# Patient Record
Sex: Female | Born: 1935
Health system: Southern US, Community
[De-identification: ages and names within clinical notes are randomized; demographics above are authoritative.]

## PROBLEM LIST (undated history)

## (undated) DIAGNOSIS — I10 Essential (primary) hypertension: Secondary | ICD-10-CM

## (undated) DIAGNOSIS — I129 Hypertensive chronic kidney disease with stage 1 through stage 4 chronic kidney disease, or unspecified chronic kidney disease: Secondary | ICD-10-CM

## (undated) DIAGNOSIS — Z6820 Body mass index (BMI) 20.0-20.9, adult: Secondary | ICD-10-CM

## (undated) DIAGNOSIS — I2510 Atherosclerotic heart disease of native coronary artery without angina pectoris: Secondary | ICD-10-CM

## (undated) DIAGNOSIS — N390 Urinary tract infection, site not specified: Secondary | ICD-10-CM

## (undated) DIAGNOSIS — Z950 Presence of cardiac pacemaker: Secondary | ICD-10-CM

## (undated) DIAGNOSIS — G4731 Primary central sleep apnea: Secondary | ICD-10-CM

## (undated) DIAGNOSIS — I739 Peripheral vascular disease, unspecified: Secondary | ICD-10-CM

## (undated) DIAGNOSIS — U071 COVID-19: Secondary | ICD-10-CM

## (undated) DIAGNOSIS — D649 Anemia, unspecified: Secondary | ICD-10-CM

## (undated) DIAGNOSIS — I099 Rheumatic heart disease, unspecified: Secondary | ICD-10-CM

## (undated) DIAGNOSIS — K2970 Gastritis, unspecified, without bleeding: Secondary | ICD-10-CM

## (undated) DIAGNOSIS — I340 Nonrheumatic mitral (valve) insufficiency: Secondary | ICD-10-CM

## (undated) DIAGNOSIS — I38 Endocarditis, valve unspecified: Secondary | ICD-10-CM

## (undated) DIAGNOSIS — E7800 Pure hypercholesterolemia, unspecified: Secondary | ICD-10-CM

## (undated) DIAGNOSIS — K219 Gastro-esophageal reflux disease without esophagitis: Secondary | ICD-10-CM

## (undated) DIAGNOSIS — H269 Unspecified cataract: Secondary | ICD-10-CM

## (undated) DIAGNOSIS — K838 Other specified diseases of biliary tract: Secondary | ICD-10-CM

## (undated) DIAGNOSIS — I4821 Permanent atrial fibrillation: Secondary | ICD-10-CM

## (undated) DIAGNOSIS — K5792 Diverticulitis of intestine, part unspecified, without perforation or abscess without bleeding: Secondary | ICD-10-CM

## (undated) DIAGNOSIS — N182 Chronic kidney disease, stage 2 (mild): Secondary | ICD-10-CM

## (undated) DIAGNOSIS — I509 Heart failure, unspecified: Secondary | ICD-10-CM

## (undated) DIAGNOSIS — J4489 Other specified chronic obstructive pulmonary disease: Secondary | ICD-10-CM

## (undated) DIAGNOSIS — E559 Vitamin D deficiency, unspecified: Secondary | ICD-10-CM

## (undated) DIAGNOSIS — J449 Chronic obstructive pulmonary disease, unspecified: Secondary | ICD-10-CM

## (undated) DIAGNOSIS — I779 Disorder of arteries and arterioles, unspecified: Secondary | ICD-10-CM

## (undated) DIAGNOSIS — I495 Sick sinus syndrome: Secondary | ICD-10-CM

## (undated) DIAGNOSIS — R9389 Abnormal findings on diagnostic imaging of other specified body structures: Secondary | ICD-10-CM

## (undated) HISTORY — DX: Permanent atrial fibrillation: I48.21

## (undated) HISTORY — PX: HERNIA REPAIR: SHX51

## (undated) HISTORY — DX: Hypertensive chronic kidney disease with stage 1 through stage 4 chronic kidney disease, or unspecified chronic kidney disease: I12.9

## (undated) HISTORY — DX: Vitamin D deficiency, unspecified: E55.9

## (undated) HISTORY — DX: Pure hypercholesterolemia, unspecified: E78.00

## (undated) HISTORY — DX: Peripheral vascular disease, unspecified: I73.9

## (undated) HISTORY — DX: Presence of cardiac pacemaker: Z95.0

## (undated) HISTORY — DX: Gastro-esophageal reflux disease without esophagitis: K21.9

## (undated) HISTORY — DX: Diverticulitis of intestine, part unspecified, without perforation or abscess without bleeding: K57.92

## (undated) HISTORY — DX: Chronic obstructive pulmonary disease, unspecified: J44.9

## (undated) HISTORY — PX: PILONIDAL CYST / SINUS EXCISION: SUR543

## (undated) HISTORY — PX: CHOLECYSTECTOMY: SHX55

## (undated) HISTORY — DX: Other specified diseases of biliary tract: K83.8

## (undated) HISTORY — DX: Atherosclerotic heart disease of native coronary artery without angina pectoris: I25.10

## (undated) HISTORY — DX: Sick sinus syndrome: I49.5

## (undated) HISTORY — PX: HEMATOMA EVACUATION: SHX5118

## (undated) HISTORY — DX: Body mass index (BMI) 20.0-20.9, adult: Z68.20

## (undated) HISTORY — DX: Disorder of arteries and arterioles, unspecified: I77.9

## (undated) HISTORY — DX: Nonrheumatic mitral (valve) insufficiency: I34.0

## (undated) HISTORY — DX: Anemia, unspecified: D64.9

## (undated) HISTORY — DX: Chronic kidney disease, stage 2 (mild): N18.2

## (undated) HISTORY — DX: Heart failure, unspecified: I50.9

## (undated) HISTORY — DX: Abnormal findings on diagnostic imaging of other specified body structures: R93.89

## (undated) HISTORY — DX: Unspecified cataract: H26.9

## (undated) HISTORY — DX: Other specified chronic obstructive pulmonary disease: J44.89

---

## 2003-11-23 ENCOUNTER — Emergency Department (HOSPITAL_COMMUNITY): Admission: EM | Admit: 2003-11-23 | Discharge: 2003-11-23 | Payer: Self-pay | Admitting: Family Medicine

## 2006-09-13 ENCOUNTER — Ambulatory Visit: Payer: Self-pay | Admitting: Cardiology

## 2006-09-13 ENCOUNTER — Emergency Department (HOSPITAL_COMMUNITY): Admission: EM | Admit: 2006-09-13 | Discharge: 2006-09-13 | Payer: Self-pay | Admitting: Emergency Medicine

## 2006-09-16 ENCOUNTER — Ambulatory Visit: Payer: Self-pay | Admitting: Cardiology

## 2006-10-06 ENCOUNTER — Ambulatory Visit: Payer: Self-pay | Admitting: Cardiology

## 2006-10-13 ENCOUNTER — Ambulatory Visit: Payer: Self-pay | Admitting: Cardiology

## 2006-10-19 ENCOUNTER — Ambulatory Visit: Payer: Self-pay | Admitting: Cardiology

## 2006-10-25 ENCOUNTER — Inpatient Hospital Stay (HOSPITAL_COMMUNITY): Admission: AD | Admit: 2006-10-25 | Discharge: 2006-10-28 | Payer: Self-pay | Admitting: Internal Medicine

## 2006-10-25 ENCOUNTER — Ambulatory Visit: Payer: Self-pay | Admitting: Cardiology

## 2006-11-01 ENCOUNTER — Ambulatory Visit: Payer: Self-pay | Admitting: Cardiology

## 2006-11-05 ENCOUNTER — Ambulatory Visit: Payer: Self-pay | Admitting: Cardiology

## 2006-11-12 ENCOUNTER — Ambulatory Visit: Payer: Self-pay | Admitting: Physician Assistant

## 2006-11-19 ENCOUNTER — Ambulatory Visit: Payer: Self-pay | Admitting: Cardiology

## 2006-11-25 ENCOUNTER — Ambulatory Visit: Payer: Self-pay | Admitting: Physician Assistant

## 2006-12-02 ENCOUNTER — Ambulatory Visit: Payer: Self-pay | Admitting: Cardiology

## 2006-12-14 ENCOUNTER — Ambulatory Visit: Payer: Self-pay | Admitting: Cardiology

## 2006-12-23 ENCOUNTER — Ambulatory Visit: Payer: Self-pay | Admitting: Cardiology

## 2007-01-04 ENCOUNTER — Ambulatory Visit: Payer: Self-pay | Admitting: Cardiology

## 2007-01-10 ENCOUNTER — Ambulatory Visit: Payer: Self-pay | Admitting: Hospitalist

## 2007-01-10 ENCOUNTER — Ambulatory Visit: Payer: Self-pay | Admitting: Cardiology

## 2007-01-10 ENCOUNTER — Inpatient Hospital Stay (HOSPITAL_COMMUNITY): Admission: EM | Admit: 2007-01-10 | Discharge: 2007-01-18 | Payer: Self-pay | Admitting: Emergency Medicine

## 2007-01-12 ENCOUNTER — Encounter (INDEPENDENT_AMBULATORY_CARE_PROVIDER_SITE_OTHER): Payer: Self-pay | Admitting: Infectious Diseases

## 2007-01-20 ENCOUNTER — Ambulatory Visit: Payer: Self-pay | Admitting: Internal Medicine

## 2007-01-24 ENCOUNTER — Ambulatory Visit: Payer: Self-pay | Admitting: Cardiology

## 2007-02-03 ENCOUNTER — Ambulatory Visit: Payer: Self-pay | Admitting: Cardiology

## 2007-02-10 ENCOUNTER — Ambulatory Visit: Payer: Self-pay | Admitting: Cardiology

## 2007-02-17 ENCOUNTER — Ambulatory Visit: Payer: Self-pay | Admitting: Physician Assistant

## 2007-02-24 ENCOUNTER — Ambulatory Visit: Payer: Self-pay | Admitting: Physician Assistant

## 2007-03-04 ENCOUNTER — Ambulatory Visit: Payer: Self-pay | Admitting: Cardiology

## 2007-03-11 ENCOUNTER — Ambulatory Visit: Payer: Self-pay | Admitting: Cardiology

## 2007-03-15 ENCOUNTER — Ambulatory Visit: Payer: Self-pay | Admitting: Cardiology

## 2007-04-07 ENCOUNTER — Ambulatory Visit: Payer: Self-pay | Admitting: Cardiology

## 2007-04-13 ENCOUNTER — Ambulatory Visit: Payer: Self-pay | Admitting: Cardiology

## 2007-04-21 ENCOUNTER — Ambulatory Visit: Payer: Self-pay | Admitting: Cardiology

## 2007-05-10 ENCOUNTER — Ambulatory Visit: Payer: Self-pay | Admitting: Cardiology

## 2007-05-17 ENCOUNTER — Ambulatory Visit: Payer: Self-pay | Admitting: Cardiology

## 2007-05-25 ENCOUNTER — Ambulatory Visit: Payer: Self-pay | Admitting: Cardiology

## 2007-06-01 ENCOUNTER — Ambulatory Visit: Payer: Self-pay | Admitting: Cardiology

## 2007-06-07 ENCOUNTER — Ambulatory Visit: Payer: Self-pay | Admitting: Cardiology

## 2007-06-14 ENCOUNTER — Ambulatory Visit: Payer: Self-pay | Admitting: Cardiology

## 2007-06-29 ENCOUNTER — Ambulatory Visit: Payer: Self-pay | Admitting: Internal Medicine

## 2007-07-04 ENCOUNTER — Ambulatory Visit: Payer: Self-pay | Admitting: Cardiology

## 2007-07-26 ENCOUNTER — Ambulatory Visit: Payer: Self-pay | Admitting: Cardiology

## 2007-08-22 ENCOUNTER — Ambulatory Visit: Payer: Self-pay | Admitting: Cardiology

## 2007-09-19 ENCOUNTER — Ambulatory Visit: Payer: Self-pay | Admitting: Cardiology

## 2007-09-26 ENCOUNTER — Inpatient Hospital Stay (HOSPITAL_COMMUNITY): Admission: EM | Admit: 2007-09-26 | Discharge: 2007-09-29 | Payer: Self-pay | Admitting: Emergency Medicine

## 2007-10-17 ENCOUNTER — Ambulatory Visit: Payer: Self-pay | Admitting: Cardiology

## 2007-10-25 ENCOUNTER — Encounter: Payer: Self-pay | Admitting: Cardiology

## 2007-11-14 ENCOUNTER — Ambulatory Visit: Payer: Self-pay | Admitting: Cardiology

## 2007-12-06 ENCOUNTER — Ambulatory Visit: Payer: Self-pay | Admitting: Cardiology

## 2007-12-29 ENCOUNTER — Ambulatory Visit: Payer: Self-pay | Admitting: Cardiology

## 2008-01-09 ENCOUNTER — Encounter: Payer: Self-pay | Admitting: Cardiology

## 2008-02-03 ENCOUNTER — Ambulatory Visit: Payer: Self-pay | Admitting: Cardiology

## 2008-05-11 ENCOUNTER — Observation Stay (HOSPITAL_COMMUNITY): Admission: EM | Admit: 2008-05-11 | Discharge: 2008-05-15 | Payer: Self-pay | Admitting: Emergency Medicine

## 2009-01-19 DIAGNOSIS — I10 Essential (primary) hypertension: Secondary | ICD-10-CM | POA: Insufficient documentation

## 2009-01-19 DIAGNOSIS — J449 Chronic obstructive pulmonary disease, unspecified: Secondary | ICD-10-CM

## 2009-01-19 DIAGNOSIS — I509 Heart failure, unspecified: Secondary | ICD-10-CM | POA: Insufficient documentation

## 2009-01-19 DIAGNOSIS — I4821 Permanent atrial fibrillation: Secondary | ICD-10-CM

## 2009-01-19 DIAGNOSIS — R011 Cardiac murmur, unspecified: Secondary | ICD-10-CM

## 2009-01-19 DIAGNOSIS — E1129 Type 2 diabetes mellitus with other diabetic kidney complication: Secondary | ICD-10-CM | POA: Insufficient documentation

## 2009-08-10 DIAGNOSIS — I2510 Atherosclerotic heart disease of native coronary artery without angina pectoris: Secondary | ICD-10-CM

## 2009-08-10 HISTORY — DX: Atherosclerotic heart disease of native coronary artery without angina pectoris: I25.10

## 2009-08-31 ENCOUNTER — Encounter: Payer: Self-pay | Admitting: Cardiology

## 2009-09-23 ENCOUNTER — Encounter: Payer: Self-pay | Admitting: Cardiology

## 2009-09-23 ENCOUNTER — Ambulatory Visit: Payer: Self-pay | Admitting: Cardiology

## 2009-09-27 ENCOUNTER — Encounter: Payer: Self-pay | Admitting: Cardiology

## 2009-10-30 ENCOUNTER — Encounter: Payer: Self-pay | Admitting: Cardiology

## 2009-10-30 ENCOUNTER — Telehealth (INDEPENDENT_AMBULATORY_CARE_PROVIDER_SITE_OTHER): Payer: Self-pay | Admitting: *Deleted

## 2009-11-04 ENCOUNTER — Ambulatory Visit: Payer: Self-pay | Admitting: Cardiology

## 2010-03-17 ENCOUNTER — Telehealth (INDEPENDENT_AMBULATORY_CARE_PROVIDER_SITE_OTHER): Payer: Self-pay | Admitting: *Deleted

## 2010-03-26 ENCOUNTER — Ambulatory Visit: Payer: Self-pay | Admitting: Cardiology

## 2010-03-26 ENCOUNTER — Encounter (INDEPENDENT_AMBULATORY_CARE_PROVIDER_SITE_OTHER): Payer: Self-pay | Admitting: *Deleted

## 2010-03-29 ENCOUNTER — Encounter: Payer: Self-pay | Admitting: Cardiology

## 2010-03-31 ENCOUNTER — Ambulatory Visit: Payer: Self-pay | Admitting: Cardiology

## 2010-04-04 ENCOUNTER — Ambulatory Visit: Payer: Self-pay | Admitting: Internal Medicine

## 2010-04-04 ENCOUNTER — Inpatient Hospital Stay (HOSPITAL_COMMUNITY): Admission: AD | Admit: 2010-04-04 | Discharge: 2010-04-19 | Payer: Self-pay | Admitting: Cardiovascular Disease

## 2010-04-07 ENCOUNTER — Ambulatory Visit: Payer: Self-pay | Admitting: Vascular Surgery

## 2010-04-12 ENCOUNTER — Encounter: Payer: Self-pay | Admitting: Cardiology

## 2010-04-21 ENCOUNTER — Telehealth (INDEPENDENT_AMBULATORY_CARE_PROVIDER_SITE_OTHER): Payer: Self-pay | Admitting: *Deleted

## 2010-04-22 ENCOUNTER — Encounter: Payer: Self-pay | Admitting: Cardiology

## 2010-04-24 ENCOUNTER — Encounter: Payer: Self-pay | Admitting: Cardiology

## 2010-05-01 ENCOUNTER — Ambulatory Visit: Payer: Self-pay | Admitting: Vascular Surgery

## 2010-05-01 ENCOUNTER — Encounter: Payer: Self-pay | Admitting: Cardiology

## 2010-05-08 ENCOUNTER — Ambulatory Visit: Payer: Self-pay | Admitting: Cardiology

## 2010-05-16 ENCOUNTER — Ambulatory Visit: Payer: Self-pay | Admitting: Cardiology

## 2010-05-16 DIAGNOSIS — D649 Anemia, unspecified: Secondary | ICD-10-CM

## 2010-05-30 ENCOUNTER — Encounter: Payer: Self-pay | Admitting: Cardiology

## 2010-05-30 DIAGNOSIS — I050 Rheumatic mitral stenosis: Secondary | ICD-10-CM

## 2010-06-02 ENCOUNTER — Ambulatory Visit: Payer: Self-pay | Admitting: Cardiology

## 2010-06-03 ENCOUNTER — Encounter (INDEPENDENT_AMBULATORY_CARE_PROVIDER_SITE_OTHER): Payer: Self-pay | Admitting: *Deleted

## 2010-06-24 ENCOUNTER — Ambulatory Visit: Payer: Self-pay | Admitting: Cardiology

## 2010-07-01 ENCOUNTER — Telehealth (INDEPENDENT_AMBULATORY_CARE_PROVIDER_SITE_OTHER): Payer: Self-pay | Admitting: *Deleted

## 2010-07-13 ENCOUNTER — Encounter: Payer: Self-pay | Admitting: Cardiology

## 2010-07-16 ENCOUNTER — Encounter: Payer: Self-pay | Admitting: Cardiology

## 2010-07-16 ENCOUNTER — Encounter: Payer: Self-pay | Admitting: Physician Assistant

## 2010-07-17 ENCOUNTER — Encounter: Payer: Self-pay | Admitting: Cardiology

## 2010-07-18 ENCOUNTER — Encounter: Payer: Self-pay | Admitting: Physician Assistant

## 2010-07-18 ENCOUNTER — Encounter: Payer: Self-pay | Admitting: Cardiology

## 2010-08-10 ENCOUNTER — Encounter: Payer: Self-pay | Admitting: Cardiology

## 2010-08-11 ENCOUNTER — Encounter: Payer: Self-pay | Admitting: Cardiology

## 2010-08-12 ENCOUNTER — Encounter: Payer: Self-pay | Admitting: Cardiology

## 2010-08-27 ENCOUNTER — Encounter: Payer: Self-pay | Admitting: Physician Assistant

## 2010-08-27 ENCOUNTER — Ambulatory Visit
Admission: RE | Admit: 2010-08-27 | Discharge: 2010-08-27 | Payer: Self-pay | Source: Home / Self Care | Attending: Cardiology | Admitting: Cardiology

## 2010-08-27 DIAGNOSIS — I2510 Atherosclerotic heart disease of native coronary artery without angina pectoris: Secondary | ICD-10-CM | POA: Insufficient documentation

## 2010-09-09 NOTE — Progress Notes (Signed)
Summary: V V OFFICE VISIT  DR.FIELDS  V V OFFICE VISIT  DR.FIELDS   Imported By: Delfino Lovett 05/16/2010 12:43:32  _____________________________________________________________________  External Attachment:    Type:   Image     Comment:   External Document

## 2010-09-09 NOTE — Assessment & Plan Note (Signed)
Summary: f40m  --[facility] & [facility] POST [facility] 9/10   Visit Type:  Follow-up Primary Provider:  Stephanie Coup   History of Present Illness: the patient is a 76 year old female with a history of a relation and new-onset cardiomyopathy.  The patient was evaluated recently at [facility] where cardiac catheterization and her procedure was complicated by left rectus sheath hematoma while on Coumadin.  Her current ejection fraction is 35 to 40%.  During the hospitalization of [facility] there was concern of moderate mitral stenosis and mild to moderate mitral regurgitation but due to the complication of a rectus sheath hematoma noted TEE was performed.  The patient was recently admitted back to [facility] with nausea intrafibrillation.  Adjustments were made in her rate controlling agents.  The patient was subsequently discharged and the plan was for her to follow-up in the office and discuss a plan to proceed with a transesophageal echocardiogram.  It is not clear if the patient's cardiomyopathy is a tachycardia-induced cardiomyopathy.  He notes also the patient has prior history of atrial flutter ablation.  The patient currently still remains off Coumadin therapy.  She also remains anemic.  Preventive Screening-Counseling & Management  Alcohol-Tobacco     Smoking Status: quit     Year Quit: 2008  Current Medications (verified): 1)  Diltiazem Hcl Er Beads 240 Mg Xr24h-Cap (Diltiazem Hcl Er Beads) .... Take 1 Tablet By Mouth Once A Day 2)  Lipitor 40 Mg Tabs (Atorvastatin Calcium) .... Take 1 Tab By Mouth At Bedtime 3)  Accolate 20 Mg Tabs (Zafirlukast) .... Take 1 Tablet By Mouth Twice A Day 4)  Nexium 40 Mg Cpdr (Esomeprazole Magnesium) .... Take 1 Tablet By Mouth Once A Day 5)  Metformin Hcl 500 Mg Tabs (Metformin Hcl) .... Take 1 Tablet By Mouth Two Times A Day 6)  Advair Diskus 250-50 Mcg/dose Aepb (Fluticasone-Salmeterol) .... One Puff Two Times A Day 7)  Metoprolol Tartrate 50  Mg Tabs (Metoprolol Tartrate) .... Take 1 Tablet By Mouth Two Times A Day 8)  Iron 325 (65 Fe) Mg Tabs (Ferrous Sulfate) .... Take 1 Tablet By Mouth Twice A Day 9)  Furosemide 20 Mg Tabs (Furosemide) .... Take 1 Tablet By Mouth Once A Day 10)  Hydrocodone-Acetaminophen 5-325 Mg Tabs (Hydrocodone-Acetaminophen) .... Take 1-2 By Mouth Every 6 Hours As Needed Pain 11)  Prednisone 10 Mg Tabs (Prednisone) .... Take 1 Tablet By Mouth Once A Day 12)  Spiriva Handihaler 18 Mcg Caps (Tiotropium Bromide Monohydrate) .... One Inhalation Daily  Allergies (verified): No Known Drug Allergies  Comments:  Nurse/Medical Assistant: The patient's medication list and allergies were reviewed with the patient and were updated in the Medication and Allergy Lists.  Past History:  Family History: Last updated: 01/19/2009  Father had heart disease.  Mother had cancer of the   uterus.  Her daughter has kidney cancer, now in remission.      Social History: Last updated: 01/19/2009  She lives with her granddaughter.  No history of   illicit drug or alcohol use.  She smoked about one pack per day for over   45 years; she quit over a year now.  She has 2 children.      Risk Factors: Smoking Status: quit (05/16/2010)  Past Medical History: Current Problems:  HEART MURMUR, BENIGN (ICD-785.2) HYPERTENSION (ICD-401.9) CHF (ICD-428.0) ATRIAL FIBRILLATION (ICD-427.31) COPD (ICD-496) DM (ICD-250.00) status post left rectus sheath hematoma status post evacuation April 15, 2010 history of blood loss anemia secondary  to large left rectus sheath hematoma nonischemic cardiopathy ejection fraction 35 to 40% new-onset concern of moderate mitral stenosis and mild to moderate mitral regurgitation by cardiac catheterization COPD abnormal chest CT scan with increased mediastinal and hilar adenopathy question of sarcoidosis plan follow up with Dr. Koleen Nimrod persistent atrial fibrillation and flutter bradycardia  requiring discontinuation of digoxin and calcium channel blockers GERD dyslipidemia history of atrial flutter ablation    Vital Signs:  Patient profile:   76 year old female Height:      63 inches Weight:      148 pounds Pulse rate:   60 / minute BP sitting:   93 / 61  (left arm) Cuff size:   regular  Vitals Entered By: Georgina Peer (May 16, 2010 2:01 PM)  Physical Exam  Additional Exam:  General: Well-developed, well-nourished in no distress head: Normocephalic and atraumatic eyes PERRLA/EOMI intact, conjunctiva and lids normal nose: No deformity or lesions mouth normal dentition, normal posterior pharynx neck: Supple, no JVD.  No masses, thyromegaly or abnormal cervical nodes lungs: Normal breath sounds bilaterally without wheezing.  Normal percussion heart:irregular rate and rhythm with normal S1 and S2, no S3 or S4.  PMI is normal.  No pathological murmurs abdomen: Normal bowel sounds, abdomen is soft and nontender without masses, organomegaly or hernias noted.  No hepatosplenomegaly musculoskeletal: Back normal, normal gait muscle strength and tone normal pulsus: Pulse is normal in all 4 extremities Extremities: No peripheral pitting edema neurologic: Alert and oriented x 3 skin: Intact without lesions or rashes cervical nodes: No significant adenopathy psychologic: Normal affect    Impression & Recommendations:  Problem # 1:  MITRAL STENOSIS (ICD-394.0) is not clear the patient has mitral stenosis.  There is very little evidence by physical examination.  We will proceed with transesophageal echocardiogram.  Problem # 2:  HYPERTENSION (ICD-401.9) Assessment: Comment Only blood pressure is relatively low and we will hold lisinopril The following medications were removed from the medication list:    Aspir-low 81 Mg Tbec (Aspirin) .[name]... Take 1 tablet by mouth as needed    Lisinopril 5 Mg Tabs (Lisinopril) .[name]... Take 1 tablet by mouth once a day Her updated  medication list for this problem includes:    Diltiazem Hcl Er Beads 240 Mg Xr24h-cap (Diltiazem hcl er beads) .[name]... Take 1 tablet by mouth once a day    Metoprolol Tartrate 50 Mg Tabs (Metoprolol tartrate) .[name]... Take 1 tablet by mouth two times a day    Furosemide 20 Mg Tabs (Furosemide) .[name]... Take 1 tablet by mouth once a day  Orders: T-CBC No Diff (123456) T-Basic Metabolic Panel (99991111)  Problem # 3:  ATRIAL FIBRILLATION (ICD-427.31) the patient remains in nature for relation.  Currently there are no plans to proceed with cardioversion given the fact that the patient is still off Coumadin.  She was seen by Dr. Oneida Alar on September 22 still felt that the patient should remain off Coumadin. The following medications were removed from the medication list:    Warfarin Sodium 2 Mg Tabs (Warfarin sodium) .[name]... Take as directed per coumadin clinic    Aspir-low 81 Mg Tbec (Aspirin) .[name]... Take 1 tablet by mouth as needed Her updated medication list for this problem includes:    Metoprolol Tartrate 50 Mg Tabs (Metoprolol tartrate) .[name]... Take 1 tablet by mouth two times a day  Orders: T-CBC No Diff (123456) T-Basic Metabolic Panel (99991111)  Problem # 4:  ANEMIA (ICD-285.9) iron sulfate will be increased to 325  mg p.o. t.i.d.  Patient Instructions: 1)  Labs:  CBC, BMET - TODAY 2)  Increase Iron to three times a day  3)  Hold Lisinopril 4)  Hold Aspirin 5)  TEE next week 6)  Follow up in  4-6 weeks

## 2010-09-09 NOTE — Letter (Signed)
Summary: [facility] D/C DR. PARSONS  [facility] D/C DR. PARSONS   Imported By: Delfino Lovett 11/04/2009 10:14:49  _____________________________________________________________________  External Attachment:    Type:   Image     Comment:   External Document

## 2010-09-09 NOTE — Progress Notes (Signed)
Summary: d/c instructions from weekend  Phone Note Call from Patient   Caller: Richardson Dopp, PA  Summary of Call: Message left on voicemail from weekend.  D/C 9/10 - see GD in 2 weeks.  Needs CBC, BMET next week - Wednedsay or Thursday.  f/u with Dr. Koleen Nimrod for pulmonology also.    Spoke with Arbie Cookey with [facility] - give order for above labs.  Patient notified that [facility] will call her to notify of day.  Also, states she is calling to schedule OV with Dr. Koleen Nimrod today.  Will see PMD on Monday, 9/19.    Lovina Reach, LPN  September 12, 624THL 4:38 PM

## 2010-09-09 NOTE — Progress Notes (Signed)
Summary: weakness, heart pounding     Phone Note Other Incoming Call back at [phone]   Caller: [facility]  Summary of Call: Home health called - Today not feeling well.  132/86  100  Just c/o feeling weak.  States she feels like heart is pounding occas.   Lovina Reach, LPN  November 22, 624THL 11:47 AM   Follow-up for Phone Call        increase diltiazem CD 300 mg p.o. q. daily Follow-up by: Terald Sleeper, MD, [facility],  July 04, 2010 2:25 PM  Additional Follow-up for Phone Call Additional follow up Details #1::        Left message to return call.  Lovina Reach, LPN  November 25, 624THL 3:32 PM   No answer at pt home.  Left message to return call with [facility] (home health). Lovina Reach, LPN  November 29, 624THL 11:52 AM     Additional Follow-up for Phone Call Additional follow up Details #2::    Patient notified.    Lovina Reach, LPN  December  1, 624THL 10:17 AM   New/Updated Medications: DILTIAZEM HCL COATED BEADS 300 MG XR24H-CAP (DILTIAZEM HCL COATED BEADS) Take 1 tablet by mouth once a day Prescriptions: DILTIAZEM HCL COATED BEADS 300 MG XR24H-CAP (DILTIAZEM HCL COATED BEADS) Take 1 tablet by mouth once a day  #30 x 6   Entered by:   Lovina Reach, LPN   Authorized by:   Terald Sleeper, MD, [facility]   Signed by:   Lovina Reach, LPN on [id number]   Method used:   Electronically to        [facility] [phone]* (retail)       [place], [place]  [address]       Ph: [phone] or [phone]       Fax: [phone]   RxID:   [id number]

## 2010-09-09 NOTE — Miscellaneous (Signed)
Summary: Home Care Report/ [place] [facility]/ [facility]   Imported By: Bartholomew Boards 04/29/2010 10:51:23  _____________________________________________________________________  External Attachment:    Type:   Image     Comment:   External Document

## 2010-09-09 NOTE — Procedures (Signed)
Summary: Holter and Event/ CARDIONET  Holter and Event/ CARDIONET   Imported By: Bartholomew Boards 04/09/2010 10:15:59  _____________________________________________________________________  External Attachment:    Type:   Image     Comment:   External Document  Appended Document: Holter and Event/ CARDIONET atrial fibrillation rate controlled. Continue medical therapy  Appended Document: Holter and Event/ CARDIONET Patient informed of the above via message machine.

## 2010-09-09 NOTE — Miscellaneous (Signed)
Summary: Orders Update - TEE   Clinical Lists Changes  Problems: Added new problem of MITRAL STENOSIS (ICD-394.0) Orders: Added new Referral order of Trans Esophageal Echocardiogram (TEE) - Signed

## 2010-09-09 NOTE — Assessment & Plan Note (Signed)
Summary: [facility]  Nurse Visit  CC: 20 HOUR HOLTER Comments monitor placed without difficulty and patient verbalized understanding of instructions.   Allergies: No Known Drug Allergies  Orders Added: 1)  Holter Monitor [Holter Monitor] 2)  Est. Patient Level I [id number]

## 2010-09-09 NOTE — Assessment & Plan Note (Signed)
Summary: eph d/c [facility] 09-27-2009   History of Present Illness: the patient is a 76 year old female with a history of permanent atrial fibrillation, COPD, hypertension, diabetes, hyperlipidemia, GERD and valvular heart disease.  Last echocardiogram demonstrate ejection fraction of 50 to 55% mild aortic stenosis and mild to moderate mitral regurgitation as well as biatrial enlargement.  The patient was recently hospitalized with atrial fibrillation with rapid ventricular response.  Medications were adjusted with an increase in her calcium channel blocker.  The patient also has a history of chronic anemia but without any definite GI bleeding.  She underwent a blood transfusion.  Is currently hemodynamically stable.  She still reports occasional palpitations.  She denies any orthopnea PND she denies any chest pain or exertional shortness of breath.   Preventive Screening-Counseling & Management  Alcohol-Tobacco     Smoking Status: quit     Year Started: 89 yrs     Year Quit: 2 yrs  Current Medications (verified): 1)  Diltiazem Hcl Er Beads 360 Mg Xr24h-Cap (Diltiazem Hcl Er Beads) .... Take 1 Tablet By Mouth Once A Day 2)  Lipitor 40 Mg Tabs (Atorvastatin Calcium) .... Take 1 Tab By Mouth At Bedtime 3)  Warfarin Sodium 2 Mg Tabs (Warfarin Sodium) .... Take As Directed Per Coumadin Clinic 4)  Accolate 20 Mg Tabs (Zafirlukast) .... Take 1 Tablet By Mouth Once A Day 5)  Nexium 40 Mg Cpdr (Esomeprazole Magnesium) .... Take 1 Tablet By Mouth Once A Day 6)  Aspir-Low 81 Mg Tbec (Aspirin) .... Take 1 Tablet By Mouth As Needed 7)  Metformin Hcl 500 Mg Tabs (Metformin Hcl) .... Take 1 Tablet By Mouth Two Times A Day 8)  Lisinopril 10 Mg Tabs (Lisinopril) .... Take 1 Tablet By Mouth Once A Day 9)  Advair Diskus 250-50 Mcg/dose Aepb (Fluticasone-Salmeterol) .... One Puff Two Times A Day 10)  Metoprolol Tartrate 25 Mg Tabs (Metoprolol Tartrate) .... Take 1 Tablet By Mouth Three Times A Day 11)  Iron 325 (65  Fe) Mg Tabs (Ferrous Sulfate) .... Take 1 Tablet By Mouth Once A Day  Allergies (verified): No Known Drug Allergies  Past History:  Past Medical History: Last updated: 01/19/2009 Current Problems:  HEART MURMUR, BENIGN (ICD-785.2) HYPERTENSION (ICD-401.9) CHF (ICD-428.0) ATRIAL FIBRILLATION (ICD-427.31) COPD (ICD-496) DM (ICD-250.00)    Family History: Last updated: 01/19/2009  Father had heart disease.  Mother had cancer of the   uterus.  Her daughter has kidney cancer, now in remission.      Social History: Last updated: 01/19/2009  She lives with her granddaughter.  No history of   illicit drug or alcohol use.  She smoked about one pack per day for over   45 years; she quit over a year now.  She has 2 children.      Risk Factors: Smoking Status: quit (11/04/2009)  Social History: Smoking Status:  quit  Review of Systems       The patient complains of palpitations.  The patient denies fatigue, malaise, fever, weight gain/loss, vision loss, decreased hearing, hoarseness, chest pain, shortness of breath, prolonged cough, wheezing, sleep apnea, coughing up blood, abdominal pain, blood in stool, nausea, vomiting, diarrhea, heartburn, incontinence, blood in urine, muscle weakness, joint pain, leg swelling, rash, skin lesions, headache, fainting, dizziness, depression, anxiety, enlarged lymph nodes, easy bruising or bleeding, and environmental allergies.    Vital Signs:  Patient profile:   76 year old female Height:      63 inches Weight:  171 pounds BMI:     30.40 Pulse rate:   84 / minute BP sitting:   133 / 92  (left arm) Cuff size:   regular  Vitals Entered By: Lovina Reach, LPN (March 28, 624THL 624THL AM)  Nutrition Counseling: Patient's BMI is greater than 25 and therefore counseled on weight management options. Is Patient Diabetic? Yes Comments post hosp   Physical Exam  Additional Exam:  General: Well-developed, well-nourished in no distress head:  Normocephalic and atraumatic eyes PERRLA/EOMI intact, conjunctiva and lids normal nose: No deformity or lesions mouth normal dentition, normal posterior pharynx neck: Supple, no JVD.  No masses, thyromegaly or abnormal cervical nodes lungs: Normal breath sounds bilaterally without wheezing.  Normal percussion heart:irregular rate and rhythm with normal S1 and S2, no S3 or S4.  PMI is normal.  No pathological murmurs abdomen: Normal bowel sounds, abdomen is soft and nontender without masses, organomegaly or hernias noted.  No hepatosplenomegaly musculoskeletal: Back normal, normal gait muscle strength and tone normal pulsus: Pulse is normal in all 4 extremities Extremities: No peripheral pitting edema neurologic: Alert and oriented x 3 skin: Intact without lesions or rashes cervical nodes: No significant adenopathy psychologic: Normal affect    Impression & Recommendations:  Problem # 1:  ATRIAL FIBRILLATION (ICD-427.31) will increase metoprolol to 25 minutes p.o. t.i.d. Her updated medication list for this problem includes:    Warfarin Sodium 2 Mg Tabs (Warfarin sodium) .[name]... Take as directed per coumadin clinic    Aspir-low 81 Mg Tbec (Aspirin) .[name]... Take 1 tablet by mouth as needed    Metoprolol Tartrate 25 Mg Tabs (Metoprolol tartrate) .[name]... Take 1 tablet by mouth three times a day  Problem # 2:  COPD (ICD-496) stable.  No exacerbation tolerating her medications. Her updated medication list for this problem includes:    Accolate 20 Mg Tabs (Zafirlukast) .[name]... Take 1 tablet by mouth once a day    Advair Diskus 250-50 Mcg/dose Aepb (Fluticasone-salmeterol) ..... One puff two times a day  Problem # 3:  HYPERTENSION (ICD-401.9) blood pressure is well-controlled. The following medications were removed from the medication list:    Lisinopril 40 Mg Tabs (Lisinopril) .[name]... Take 1 tablet by mouth once a day Her updated medication list for this problem includes:    Diltiazem Hcl Er  Beads 360 Mg Xr24h-cap (Diltiazem hcl er beads) .[name]... Take 1 tablet by mouth once a day    Aspir-low 81 Mg Tbec (Aspirin) .[name]... Take 1 tablet by mouth as needed    Lisinopril 10 Mg Tabs (Lisinopril) .[name]... Take 1 tablet by mouth once a day    Metoprolol Tartrate 25 Mg Tabs (Metoprolol tartrate) .[name]... Take 1 tablet by mouth three times a day  Problem # 4:  HEART MURMUR, BENIGN (ICD-785.2) valvular disease as as outlined above.  Continue medical treatment. The following medications were removed from the medication list:    Lisinopril 40 Mg Tabs (Lisinopril) .[name]... Take 1 tablet by mouth once a day Her updated medication list for this problem includes:    Lisinopril 10 Mg Tabs (Lisinopril) .[name]... Take 1 tablet by mouth once a day    Metoprolol Tartrate 25 Mg Tabs (Metoprolol tartrate) .[name]... Take 1 tablet by mouth three times a day  Patient Instructions: 1)  Increase Metoprolol tart to 25mg  three times a day  2)  Follow up in  6 months Prescriptions: METOPROLOL TARTRATE 25 MG TABS (METOPROLOL TARTRATE) Take 1 tablet by mouth three times a day  #90 x 6  Entered by:   Lovina Reach, LPN   Authorized by:   Terald Sleeper, MD, [facility]   Signed by:   Lovina Reach, LPN on [id number]   Method used:   Electronically to        [facility] [phone]* (retail)       [place],   [address]       Ph: [phone] or [phone]       Fax: [phone]   RxID:   [id number]

## 2010-09-09 NOTE — Progress Notes (Signed)
Summary: Heart Racing  Phone Note Other Incoming   Summary of Call: Pt walked into the office Summary of Call: Pt walked into the office stating her heart is racing. she states she was in the hospital for this about a month ago and is on lopressor. She states she noticed heart racing yesterday and it seems to be getting worse. She states it seems like the Lopressor isn't working as well now. She denies any chest pain, SOB, dizzy or syncope. She states when she was recently in the hospital she was also diagnosed with pneumonia and was also given 2 units of PRBC's b/c her hgb was low. Pt notified MD not in the office at present and she should go to ER for evaluation. Pt verbalized understanding and states her dgt had already planned to take her to the ER if we couldn't see her.  Initial call taken by: Gurney Maxin, RN, BSN,  October 30, 2009 12:09 PM

## 2010-09-09 NOTE — Assessment & Plan Note (Signed)
Summary: f79m  --agh   Visit Type:  Follow-up Primary Provider:  Stephanie Coup   History of Present Illness: Heart rate OK, no complicatioms post TEE.  No scp. No sob No racing no palitations, briefly in the morning.  Will call Dr Kelli Churn regarding coumadin restarting  Hold Lisinopril, heamtoma got infected again. Got antibiotics. Still on iron. No MS by TEE. Leave in afib now-asymptomatic. TEE showed an ejection fraction of 60-65%. Left atrial appendage velocity was mildly reduced. There was no thrombus. The left atrium is moderate to severely dilated the right atrium is also moderate to severely dilated. There was no evidence of mitral stenosis. There was mild mitral regurgitation. There was extensive intimal thickening with a trauma to disease in the descending and transverse aorta.. Based on these findings I do not think we will make attempts to restore normal sinus rhythm in this patient. We'll try to start Coumadin during her next office visit.  Preventive Screening-Counseling & Management  Alcohol-Tobacco     Smoking Status: quit     Year Quit: 2008  Current Medications (verified): 1)  Diltiazem Hcl Er Beads 240 Mg Xr24h-Cap (Diltiazem Hcl Er Beads) .... Take 1 Tablet By Mouth Once A Day 2)  Lipitor 40 Mg Tabs (Atorvastatin Calcium) .... Take 1 Tab By Mouth At Bedtime 3)  Accolate 20 Mg Tabs (Zafirlukast) .... Take 1 Tablet By Mouth Twice A Day 4)  Nexium 40 Mg Cpdr (Esomeprazole Magnesium) .... Take 1 Tablet By Mouth Once A Day 5)  Metformin Hcl 500 Mg Tabs (Metformin Hcl) .... Take 1 Tablet By Mouth Two Times A Day 6)  Advair Diskus 250-50 Mcg/dose Aepb (Fluticasone-Salmeterol) .... One Puff Two Times A Day 7)  Metoprolol Tartrate 50 Mg Tabs (Metoprolol Tartrate) .... Take 1 Tablet By Mouth Two Times A Day 8)  Iron 325 (65 Fe) Mg Tabs (Ferrous Sulfate) .... Take 1 Tablet By Mouth Twice A Day 9)  Furosemide 20 Mg Tabs (Furosemide) .... Take 1 Tablet By Mouth Once A Day 10)   Hydrocodone-Acetaminophen 5-325 Mg Tabs (Hydrocodone-Acetaminophen) .... Take 1-2 By Mouth Every 6 Hours As Needed Pain 11)  Prednisone 10 Mg Tabs (Prednisone) .... Take 1 Tablet By Mouth Once A Day 12)  Spiriva Handihaler 18 Mcg Caps (Tiotropium Bromide Monohydrate) .... One Inhalation Daily  Allergies (verified): No Known Drug Allergies  Comments:  Nurse/Medical Assistant: The patient's medication list and allergies were reviewed with the patient and were updated in the Medication and Allergy Lists.  Past History:  Past Medical History: Last updated: 05/16/2010 Current Problems:  HEART MURMUR, BENIGN (ICD-785.2) HYPERTENSION (ICD-401.9) CHF (ICD-428.0) ATRIAL FIBRILLATION (ICD-427.31) COPD (ICD-496) DM (ICD-250.00) status post left rectus sheath hematoma status post evacuation April 15, 2010 history of blood loss anemia secondary to large left rectus sheath hematoma nonischemic cardiopathy ejection fraction 35 to 40% new-onset concern of moderate mitral stenosis and mild to moderate mitral regurgitation by cardiac catheterization COPD abnormal chest CT scan with increased mediastinal and hilar adenopathy question of sarcoidosis plan follow up with Dr. Koleen Nimrod persistent atrial fibrillation and flutter bradycardia requiring discontinuation of digoxin and calcium channel blockers GERD dyslipidemia history of atrial flutter ablation    Family History: Last updated: 01/19/2009  Father had heart disease.  Mother had cancer of the   uterus.  Her daughter has kidney cancer, now in remission.      Social History: Last updated: 01/19/2009  She lives with her granddaughter.  No history of   illicit drug or alcohol use.  She smoked about one pack per day for over   45 years; she quit over a year now.  She has 2 children.      Risk Factors: Smoking Status: quit (06/24/2010)  Review of Systems  The patient denies fatigue, malaise, fever, weight gain/loss, vision loss,  decreased hearing, hoarseness, chest pain, palpitations, shortness of breath, prolonged cough, wheezing, sleep apnea, coughing up blood, abdominal pain, blood in stool, nausea, vomiting, diarrhea, heartburn, incontinence, blood in urine, muscle weakness, joint pain, leg swelling, rash, skin lesions, headache, fainting, dizziness, depression, anxiety, enlarged lymph nodes, easy bruising or bleeding, and environmental allergies.    Vital Signs:  Patient profile:   76 year old female Height:      63 inches Weight:      169 pounds O2 Sat:      97 % on Room air Pulse rate:   66 / minute BP sitting:   121 / 75  (left arm) Cuff size:   regular  Vitals Entered By: Georgina Peer (June 24, 2010 12:57 PM)  O2 Flow:  Room air  Physical Exam  Additional Exam:  General: Well-developed, well-nourished in no distress head: Normocephalic and atraumatic eyes PERRLA/EOMI intact, conjunctiva and lids normal nose: No deformity or lesions mouth normal dentition, normal posterior pharynx neck: Supple, no JVD.  No masses, thyromegaly or abnormal cervical nodes lungs: Normal breath sounds bilaterally without wheezing.  Normal percussion heart:irregular rate and rhythm with normal S1 and S2, no S3 or S4.  PMI is normal.  No pathological murmurs abdomen: Normal bowel sounds, abdomen is soft and nontender without masses, organomegaly or hernias noted.  No hepatosplenomegaly musculoskeletal: Back normal, normal gait muscle strength and tone normal pulsus: Pulse is normal in all 4 extremities Extremities: No peripheral pitting edema neurologic: Alert and oriented x 3 skin: Intact without lesions or rashes cervical nodes: No significant adenopathy psychologic: Normal affect    Impression & Recommendations:  Problem # 1:  ATRIAL FIBRILLATION (ICD-427.31) patient is now in permanent atrial fibrillation.  Given her recent left rectus sheath hematoma we will hold off on Coumadin little bit longer and start  during the next office visit. Her updated medication list for this problem includes:    Metoprolol Tartrate 50 Mg Tabs (Metoprolol tartrate) .[name]... Take 1 tablet by mouth two times a day  Problem # 2:  MITRAL STENOSIS (ICD-394.0) there was no evidence of mitral stenosis by TEE.  There was mild mitral regurgitation.  Continue medical therapy.  Problem # 3:  ANEMIA (ICD-285.9) this will be followed by the patient's primary care physician  Patient Instructions: 1)  Your physician recommends that you continue on your current medications as directed. Please refer to the Current Medication list given to you today. 2)  Follow up in  2 months

## 2010-09-09 NOTE — Letter (Signed)
Summary: Risk analyst at [facility]. [address]   [place], [place] [address]   Phone: [phone]  Fax: [phone]        June 03, 2010 MRN: [id number]   [name] [address] [place], [place]  [address]   Dear [name],  Your test ordered by Rande Lawman has been reviewed by your physician (or physician assistant) and was found to be normal or stable. Your physician (or physician assistant) felt no changes were needed at this time.  ____ Echocardiogram  ____ Cardiac Stress Test  __X__ Lab Work - hemoglobin further improved, continue Iron twice a day  ____ Peripheral vascular study of arms, legs or neck  ____ CT scan or X-ray  ____ Lung or Breathing test  ____ Other:   Thank you.   Lovina Reach, LPN    Bryon Lions, M.D., F.A.C.C. Maceo Pro, M.D., F.A.C.C. Cammy Copa, M.D., F.A.C.C. Vonda Antigua, M.D., F.A.C.C. Vita Barley, M.D., F.A.C.C. Mare Ferrari, M.D., F.A.C.C. Emi Belfast, PA-C

## 2010-09-09 NOTE — Progress Notes (Signed)
Summary: heart fluttering   Phone Note Call from Patient   Summary of Call: Was just released from ER.  Chest fluttering, heart beating fast.  Can not lay down and rest.   Advised pt to return to ED if continue to not feel any better.  Stated they sent her home and said nothing was wrong.  Advised her that if she did not feel comfortable going back to [facility] that she could go to PMD today or to [facility] for further evaluation.  Advised her that Dr. Dannielle Burn is out of the office this week.  Patient verbalized understanding.  Initial call taken by: Lovina Reach, LPN,  August  8, 624THL 12:13 PM  Follow-up for Phone Call        patient has a history of rapid atrial fibrillation.  Asked her to come in for a 48-hour Holter monitor based on this will make some medication changes. Follow-up by: Terald Sleeper, MD, [facility],  March 23, 2010 4:23 PM  Additional Follow-up for Phone Call Additional follow up Details #1::        Nurse visit scheduled for Wednesday, August 17 at 9:00 for 48 hr holter monitor.  Patient verbalized understanding.  Additional Follow-up by: Lovina Reach, LPN,  August 15, 624THL 10:12 AM

## 2010-09-09 NOTE — Consult Note (Signed)
Summary: CARDIOLOGY CONSULT/ [facility]/ [facility]   Imported By: Delfino Lovett 11/04/2009 10:13:27  _____________________________________________________________________  External Attachment:    Type:   Image     Comment:   External Document

## 2010-09-11 NOTE — Letter (Signed)
Summary: [facility] D/C DR. Langley Gauss  [facility] D/C DR. Langley Gauss   Imported By: Delfino Lovett 08/27/2010 [id number]  _____________________________________________________________________  External Attachment:    Type:   Image     Comment:   External Document

## 2010-09-11 NOTE — Consult Note (Signed)
Summary: CARDIOLGY CONSULT [name]   Imported By: Delfino Lovett 08/27/2010 09:27:46  _____________________________________________________________________  External Attachment:    Type:   Image     Comment:   External Document

## 2010-09-11 NOTE — Letter (Signed)
Summary: [facility] D/C DR. Leighton Parody  [facility] D/C DR. GAIL GERENA   Imported By: Delfino Lovett 08/27/2010 09:14:22  _____________________________________________________________________  External Attachment:    Type:   Image     Comment:   External Document

## 2010-09-11 NOTE — Assessment & Plan Note (Signed)
Summary: eph d/c [facility] 08-12-2010   Visit Type:  Follow-up Primary Provider:  Stephanie Coup   History of Present Illness: patient presents for scheduled follow with Dr. Dannielle Burn.   Since her last visit here in November, however, she has been hospitalized here at [facility] on 2 separate occasions, most recently earlier this month, presenting with complaint of tachycardia palpitations. We will formally consulted in early December for A. fib with SVR, with documented heart rate as low as 30 bpm. There was also a 3.7 second pause. We did not, however, feel there was indication for permanent pacemaker. We decreased diltiazem back to 240 daily, and continued metoprolol at 25 b.i.d. We also recommended adding full dose aspirin, noting that she was to be placed back on Coumadin at time of her next OV, per Dr. Arlina Robes recommendation. This had been previously discontinued, following complication of rectus sheath hematoma, after undergoing cardiac catheterization in August 2011.  During her most recent hospitalization, metoprolol was increased to 25 t.i.d., and diltiazem was continued at the same dose of 240 mg daily. Of note, she was not on full dose aspirin.  Clinically, she has occasional tachypalpitations; however, she denies any associated presyncope/syncope or falls.  Preventive Screening-Counseling & Management  Alcohol-Tobacco     Smoking Status: quit     Year Quit: 2008  Current Medications (verified): 1)  Diltiazem Hcl Er Beads 240 Mg Xr24h-Cap (Diltiazem Hcl Er Beads) .... Take 1 Tablet By Mouth Once A Day 2)  Simvastatin 40 Mg Tabs (Simvastatin) .... Take 1 Tablet By Mouth Once A Day 3)  Accolate 20 Mg Tabs (Zafirlukast) .... Take 1 Tablet By Mouth Twice A Day 4)  Omeprazole 40 Mg Cpdr (Omeprazole) .... Take 1 Tablet By Mouth Once A Day 5)  Metformin Hcl 500 Mg Tabs (Metformin Hcl) .... Take 1 Tablet By Mouth Two Times A Day 6)  Advair Diskus 250-50 Mcg/dose Aepb (Fluticasone-Salmeterol) ....  One Puff Two Times A Day 7)  Metoprolol Tartrate 25 Mg Tabs (Metoprolol Tartrate) .... Take 1 Tablet By Mouth Two Times A Day 8)  Furosemide 20 Mg Tabs (Furosemide) .... Take 1 Tablet By Mouth Once A Day 9)  Spiriva Handihaler 18 Mcg Caps (Tiotropium Bromide Monohydrate) .... One Inhalation Daily 10)  Ativan 0.5 Mg Tabs (Lorazepam) .... Take 1 Tablet By Mouth Four Times A Day As Needed Anxiety 11)  Folic Acid 1 Mg Tabs (Folic Acid) .... Take 1 Tablet By Mouth Once A Day 12)  Coumadin 5 Mg Tabs (Warfarin Sodium) .... Take As Directed By Pmd  Allergies (verified): No Known Drug Allergies  Comments:  Nurse/Medical Assistant: The patient's medication list and allergies were reviewed with the patient and were updated in the Medication and Allergy Lists.  Past History:  Past Medical History: Last updated: 05/16/2010 Current Problems:  HEART MURMUR, BENIGN (ICD-785.2) HYPERTENSION (ICD-401.9) CHF (ICD-428.0) ATRIAL FIBRILLATION (ICD-427.31) COPD (ICD-496) DM (ICD-250.00) status post left rectus sheath hematoma status post evacuation April 15, 2010 history of blood loss anemia secondary to large left rectus sheath hematoma nonischemic cardiopathy ejection fraction 35 to 40% new-onset concern of moderate mitral stenosis and mild to moderate mitral regurgitation by cardiac catheterization COPD abnormal chest CT scan with increased mediastinal and hilar adenopathy question of sarcoidosis plan follow up with Dr. Koleen Nimrod persistent atrial fibrillation and flutter bradycardia requiring discontinuation of digoxin and calcium channel blockers GERD dyslipidemia history of atrial flutter ablation    Review of Systems       No fevers, chills, hemoptysis,  dysphagia, melena, hematocheezia, hematuria, rash, claudication, orthopnea, pnd, pedal edema. All other systems negative.   Vital Signs:  Patient profile:   76 year old female Height:      63 inches Weight:      170 pounds BMI:      30.22 Pulse rate:   86 / minute BP sitting:   117 / 74  (left arm) Cuff size:   regular  Vitals Entered By: Georgina Peer (August 27, 2010 1:04 PM)  Nutrition Counseling: Patient's BMI is greater than 25 and therefore counseled on weight management options.  Physical Exam  Additional Exam:  GEN: 76 year old female, no distress HEENT: NCAT,PERRLA,EOMI NECK: palpable pulses, no bruits; no JVD; no TM LUNGS: CTA bilaterally HEART: irregularly irregular (S1S2); no significant murmurs; no rubs; no gallops ABD: soft, NT; intact BS EXT: intact distal pulses; no edema SKIN: warm, dry MUSC: no obvious deformity NEURO: A/O (x3)     EKG  Procedure date:  08/27/2010  Findings:      atrial flutter with variable block at 83 bpm; LAD/LA HB  Impression & Recommendations:  Problem # 1:  ATRIAL FIBRILLATION (ICD-427.31)  adequately rate controlled on current regimen of diltiazem/Lopressor. Will resume Coumadin anticoagulation, which have been discontinued [id number], following complication of rectus sheath hematoma. Most recent CBC was stable earlier this month, with hematocrit 34. Patient currently not on aspirin. We will initiate Coumadin at 5 mg daily, with early follow with her primary care physician, Dr. [name], in [place]. Note, recommendation is to keep INR in range 2.0 to 2.5. Also, patient advised to take an additional metoprolol tablet for persistent tachypalpitations.  Problem # 2:  CAD (ICD-414.00)  no further workup indicated. Nonobstructive disease by cardiac catheterization, 8/11.  Problem # 3:  CHF (ICD-428.0)  euvolemic by history and clinical presentation. EF 35-40% by catheterization, 8/11; improved to 60-65% by TTE, 10/11.  Other Orders: EKG w/ Interpretation (93000)  Patient Instructions: 1)  Coumadin 5mg  every evening  2)  PT/INR check on Friday by PMD [facility]) 3)  Follow up in  6 months Prescriptions: COUMADIN 5 MG TABS (WARFARIN SODIUM) take as directed  by PMD  #30 x 1   Entered by:   Lovina Reach, LPN   Authorized by:   Terald Sleeper, MD, [facility]   Signed by:   Lovina Reach, LPN on 624THL   Method used:   Electronically to        [facility] [phone]* (retail)       [place], [place]  [address]       Ph: [phone] or [phone]       Fax: [phone]   [id number]  I have reviewed and approved all prescriptions at the time of the office visit. Maryjane Hurter, PA-C  August 27, 2010 1:45 PM

## 2010-09-11 NOTE — Medication Information (Signed)
Summary: [facility] D/C MEDICATION ORDER   [facility] D/C MEDICATION ORDER   Imported By: Delfino Lovett 08/27/2010 09:11:42  _____________________________________________________________________  External Attachment:    Type:   Image     Comment:   External Document

## 2010-09-11 NOTE — Medication Information (Signed)
Summary: [facility] D/C MEDICATION SHEET ORDER  [facility] D/C MEDICATION SHEET ORDER   Imported By: Delfino Lovett 08/27/2010 09:39:21  _____________________________________________________________________  External Attachment:    Type:   Image     Comment:   External Document

## 2010-09-22 ENCOUNTER — Telehealth (INDEPENDENT_AMBULATORY_CARE_PROVIDER_SITE_OTHER): Payer: Self-pay | Admitting: *Deleted

## 2010-09-23 ENCOUNTER — Telehealth (INDEPENDENT_AMBULATORY_CARE_PROVIDER_SITE_OTHER): Payer: Self-pay | Admitting: *Deleted

## 2010-10-05 ENCOUNTER — Telehealth: Payer: Self-pay | Admitting: Cardiology

## 2010-10-07 NOTE — Progress Notes (Signed)
Summary: WOULD LIKE METOPROLOL DOSE ADJUSTED FOR HEART  Phone Note Call from Patient Call back at Home Phone [phone]   Caller: Patient Call For: nurse Summary of Call: message left on nurse voicemail @2 :09pm that she needed her medication changed cause her heart was acting up. nurse returned call and patient stated that she was lightheaded, BP 171/101 HR-111. Patient took metoprolol at 9:00pm and had spell @10 :00pm. patient said she could feel her heart beating all night long but got better. Patient still having problem today. Patient saw [name] today,had ekg and was told that everything was okay. patient said it is much better but can still feel her heart beating.  patient said she was informed that she could take an extra metoprolol if she needed too. Patient said she has metoprolol 50mg  and she breaks them in 1/2. Patient informed if it gets intolerable, to go to ED. Patient would like to have her medication adjusted. Please advise. Uses [facility] Initial call taken by: Georgina Peer,  September 22, 2010 4:39 PM  Follow-up for Phone Call        Patient called @ 2:27 TO FIND OUT IF Doctor is going to adjust her mediciation. Please return her call. Follow-up by: Bartholomew Boards,  September 23, 2010 2:30 PM  Additional Follow-up for Phone Call Additional follow up Details #1::        I would suggest to increase her Cardizem CD to 300 mg p.o. q daily. I would continue her current dose of the Toprol however.  Additional Follow-up by: Terald Sleeper, MD, [facility],  September 29, 2010 8:39 AM    Additional Follow-up for Phone Call Additional follow up Details #2::    left message on machine to call office.Georgina Peer  September 29, 2010 12:27 AM left message on machine to call office.Georgina Peer  October 01, 2010 9:03 AM Patient informed of the above. patient stated that she already had 300mg  cardizem at home and she didn't need a new rx. patient stated that she is feeling better  than she was.    Follow-up by: Georgina Peer,  October 03, 2010 9:58 AM  New/Updated Medications: CARDIZEM CD 300 MG XR24H-CAP (DILTIAZEM HCL COATED BEADS) Take 1 tablet by mouth once a day

## 2010-10-07 NOTE — Progress Notes (Signed)
Summary: phone: Feeling no better  Phone Note Call from Patient Call back at Home Phone [phone]   Reason for Call: Acute Illness Summary of Call: Mrs. Riddles 's son called and states that she is not feeling any better and feels like heart is racing. He is going to bring her to the ER.  Initial call taken by: Delfino Lovett,  September 23, 2010 11:11 AM  Follow-up for Phone Call        I make recommendations in a prior note.  Follow-up by: Terald Sleeper, MD, [facility],  September 29, 2010 8:42 AM

## 2010-10-09 HISTORY — PX: OTHER SURGICAL HISTORY: SHX169

## 2010-10-16 NOTE — Progress Notes (Signed)
----   Converted from flag ---- ---- 09/23/2010 11:14 AM, Delfino Lovett wrote:  ------------------------------

## 2010-10-23 LAB — CBC
HCT: 26.4 % — ABNORMAL LOW (ref 36.0–46.0)
HCT: 29.8 % — ABNORMAL LOW (ref 36.0–46.0)
HCT: 30.4 % — ABNORMAL LOW (ref 36.0–46.0)
HCT: 30.9 % — ABNORMAL LOW (ref 36.0–46.0)
HCT: 31.4 % — ABNORMAL LOW (ref 36.0–46.0)
HCT: 32.8 % — ABNORMAL LOW (ref 36.0–46.0)
HCT: 33.4 % — ABNORMAL LOW (ref 36.0–46.0)
HCT: 33.4 % — ABNORMAL LOW (ref 36.0–46.0)
HCT: 33.7 % — ABNORMAL LOW (ref 36.0–46.0)
Hemoglobin: 11.1 g/dL — ABNORMAL LOW (ref 12.0–15.0)
Hemoglobin: 11.9 g/dL — ABNORMAL LOW (ref 12.0–15.0)
Hemoglobin: 8.6 g/dL — ABNORMAL LOW (ref 12.0–15.0)
Hemoglobin: 9 g/dL — ABNORMAL LOW (ref 12.0–15.0)
MCH: 29.9 pg (ref 26.0–34.0)
MCH: 30.1 pg (ref 26.0–34.0)
MCH: 31 pg (ref 26.0–34.0)
MCH: 31.1 pg (ref 26.0–34.0)
MCH: 31.1 pg (ref 26.0–34.0)
MCHC: 32.2 g/dL (ref 30.0–36.0)
MCHC: 32.3 g/dL (ref 30.0–36.0)
MCHC: 32.3 g/dL (ref 30.0–36.0)
MCHC: 33.5 g/dL (ref 30.0–36.0)
MCHC: 33.6 g/dL (ref 30.0–36.0)
MCV: 88.8 fL (ref 78.0–100.0)
MCV: 88.8 fL (ref 78.0–100.0)
MCV: 89.7 fL (ref 78.0–100.0)
MCV: 90.9 fL (ref 78.0–100.0)
MCV: 94.6 fL (ref 78.0–100.0)
MCV: 96 fL (ref 78.0–100.0)
MCV: 96.2 fL (ref 78.0–100.0)
MCV: 96.3 fL (ref 78.0–100.0)
MCV: 96.8 fL (ref 78.0–100.0)
MCV: 96.8 fL (ref 78.0–100.0)
Platelets: 202 10*3/uL (ref 150–400)
Platelets: 203 10*3/uL (ref 150–400)
Platelets: 206 10*3/uL (ref 150–400)
Platelets: 209 10*3/uL (ref 150–400)
Platelets: 219 10*3/uL (ref 150–400)
Platelets: 219 10*3/uL (ref 150–400)
Platelets: 235 10*3/uL (ref 150–400)
Platelets: 247 10*3/uL (ref 150–400)
Platelets: 247 10*3/uL (ref 150–400)
Platelets: 249 10*3/uL (ref 150–400)
RBC: 2.42 MIL/uL — ABNORMAL LOW (ref 3.87–5.11)
RBC: 2.8 MIL/uL — ABNORMAL LOW (ref 3.87–5.11)
RBC: 3.39 MIL/uL — ABNORMAL LOW (ref 3.87–5.11)
RBC: 3.41 MIL/uL — ABNORMAL LOW (ref 3.87–5.11)
RBC: 3.71 MIL/uL — ABNORMAL LOW (ref 3.87–5.11)
RBC: 3.96 MIL/uL (ref 3.87–5.11)
RDW: 15.6 % — ABNORMAL HIGH (ref 11.5–15.5)
RDW: 16.1 % — ABNORMAL HIGH (ref 11.5–15.5)
RDW: 16.6 % — ABNORMAL HIGH (ref 11.5–15.5)
RDW: 16.6 % — ABNORMAL HIGH (ref 11.5–15.5)
RDW: 18.9 % — ABNORMAL HIGH (ref 11.5–15.5)
RDW: 19.4 % — ABNORMAL HIGH (ref 11.5–15.5)
RDW: 20 % — ABNORMAL HIGH (ref 11.5–15.5)
RDW: 21.2 % — ABNORMAL HIGH (ref 11.5–15.5)
WBC: 10.3 10*3/uL (ref 4.0–10.5)
WBC: 10.6 10*3/uL — ABNORMAL HIGH (ref 4.0–10.5)
WBC: 13.1 10*3/uL — ABNORMAL HIGH (ref 4.0–10.5)
WBC: 15.9 10*3/uL — ABNORMAL HIGH (ref 4.0–10.5)
WBC: 16.6 10*3/uL — ABNORMAL HIGH (ref 4.0–10.5)
WBC: 18.5 10*3/uL — ABNORMAL HIGH (ref 4.0–10.5)
WBC: 18.9 10*3/uL — ABNORMAL HIGH (ref 4.0–10.5)
WBC: 19.2 10*3/uL — ABNORMAL HIGH (ref 4.0–10.5)
WBC: 20.1 10*3/uL — ABNORMAL HIGH (ref 4.0–10.5)

## 2010-10-23 LAB — GLUCOSE, CAPILLARY
Glucose-Capillary: 102 mg/dL — ABNORMAL HIGH (ref 70–99)
Glucose-Capillary: 106 mg/dL — ABNORMAL HIGH (ref 70–99)
Glucose-Capillary: 107 mg/dL — ABNORMAL HIGH (ref 70–99)
Glucose-Capillary: 117 mg/dL — ABNORMAL HIGH (ref 70–99)
Glucose-Capillary: 123 mg/dL — ABNORMAL HIGH (ref 70–99)
Glucose-Capillary: 130 mg/dL — ABNORMAL HIGH (ref 70–99)
Glucose-Capillary: 132 mg/dL — ABNORMAL HIGH (ref 70–99)
Glucose-Capillary: 133 mg/dL — ABNORMAL HIGH (ref 70–99)
Glucose-Capillary: 137 mg/dL — ABNORMAL HIGH (ref 70–99)
Glucose-Capillary: 141 mg/dL — ABNORMAL HIGH (ref 70–99)
Glucose-Capillary: 156 mg/dL — ABNORMAL HIGH (ref 70–99)
Glucose-Capillary: 159 mg/dL — ABNORMAL HIGH (ref 70–99)
Glucose-Capillary: 159 mg/dL — ABNORMAL HIGH (ref 70–99)
Glucose-Capillary: 163 mg/dL — ABNORMAL HIGH (ref 70–99)
Glucose-Capillary: 164 mg/dL — ABNORMAL HIGH (ref 70–99)
Glucose-Capillary: 166 mg/dL — ABNORMAL HIGH (ref 70–99)
Glucose-Capillary: 175 mg/dL — ABNORMAL HIGH (ref 70–99)
Glucose-Capillary: 175 mg/dL — ABNORMAL HIGH (ref 70–99)
Glucose-Capillary: 180 mg/dL — ABNORMAL HIGH (ref 70–99)
Glucose-Capillary: 184 mg/dL — ABNORMAL HIGH (ref 70–99)
Glucose-Capillary: 195 mg/dL — ABNORMAL HIGH (ref 70–99)
Glucose-Capillary: 209 mg/dL — ABNORMAL HIGH (ref 70–99)
Glucose-Capillary: 217 mg/dL — ABNORMAL HIGH (ref 70–99)
Glucose-Capillary: 219 mg/dL — ABNORMAL HIGH (ref 70–99)
Glucose-Capillary: 231 mg/dL — ABNORMAL HIGH (ref 70–99)
Glucose-Capillary: 237 mg/dL — ABNORMAL HIGH (ref 70–99)
Glucose-Capillary: 273 mg/dL — ABNORMAL HIGH (ref 70–99)
Glucose-Capillary: 312 mg/dL — ABNORMAL HIGH (ref 70–99)

## 2010-10-23 LAB — CROSSMATCH: Antibody Screen: NEGATIVE

## 2010-10-23 LAB — POCT I-STAT 3, VENOUS BLOOD GAS (G3P V)
Acid-base deficit: 1 mmol/L (ref 0.0–2.0)
Bicarbonate: 23.6 mEq/L (ref 20.0–24.0)
O2 Saturation: 55 %
TCO2: 24 mmol/L (ref 0–100)
TCO2: 25 mmol/L (ref 0–100)
pCO2, Ven: 38.4 mmHg — ABNORMAL LOW (ref 45.0–50.0)
pO2, Ven: 29 mmHg — CL (ref 30.0–45.0)

## 2010-10-23 LAB — BASIC METABOLIC PANEL
BUN: 14 mg/dL (ref 6–23)
BUN: 15 mg/dL (ref 6–23)
BUN: 19 mg/dL (ref 6–23)
CO2: 23 mEq/L (ref 19–32)
CO2: 25 mEq/L (ref 19–32)
CO2: 26 mEq/L (ref 19–32)
CO2: 27 mEq/L (ref 19–32)
CO2: 31 mEq/L (ref 19–32)
Calcium: 8.7 mg/dL (ref 8.4–10.5)
Calcium: 9 mg/dL (ref 8.4–10.5)
Calcium: 9 mg/dL (ref 8.4–10.5)
Calcium: 9.3 mg/dL (ref 8.4–10.5)
Calcium: 9.3 mg/dL (ref 8.4–10.5)
Calcium: 9.4 mg/dL (ref 8.4–10.5)
Calcium: 9.6 mg/dL (ref 8.4–10.5)
Chloride: 101 mEq/L (ref 96–112)
Chloride: 101 mEq/L (ref 96–112)
Chloride: 102 mEq/L (ref 96–112)
Chloride: 103 mEq/L (ref 96–112)
Chloride: 103 mEq/L (ref 96–112)
Chloride: 108 mEq/L (ref 96–112)
Chloride: 98 mEq/L (ref 96–112)
Creatinine, Ser: 0.81 mg/dL (ref 0.4–1.2)
Creatinine, Ser: 0.81 mg/dL (ref 0.4–1.2)
Creatinine, Ser: 0.85 mg/dL (ref 0.4–1.2)
Creatinine, Ser: 0.97 mg/dL (ref 0.4–1.2)
Creatinine, Ser: 0.99 mg/dL (ref 0.4–1.2)
Creatinine, Ser: 1.02 mg/dL (ref 0.4–1.2)
Creatinine, Ser: 1.04 mg/dL (ref 0.4–1.2)
Creatinine, Ser: 1.05 mg/dL (ref 0.4–1.2)
Creatinine, Ser: 1.08 mg/dL (ref 0.4–1.2)
Creatinine, Ser: 1.38 mg/dL — ABNORMAL HIGH (ref 0.4–1.2)
GFR calc Af Amer: 45 mL/min — ABNORMAL LOW (ref >60)
GFR calc Af Amer: 53 mL/min — ABNORMAL LOW (ref >60)
GFR calc Af Amer: 59 mL/min — ABNORMAL LOW (ref >60)
GFR calc Af Amer: >60 mL/min (ref >60)
GFR calc Af Amer: >60 mL/min (ref >60)
GFR calc Af Amer: >60 mL/min (ref >60)
GFR calc Af Amer: >60 mL/min (ref >60)
GFR calc Af Amer: >60 mL/min (ref >60)
GFR calc Af Amer: >60 mL/min (ref >60)
GFR calc Af Amer: >60 mL/min (ref >60)
GFR calc non Af Amer: 49 mL/min — ABNORMAL LOW (ref >60)
GFR calc non Af Amer: 53 mL/min — ABNORMAL LOW (ref >60)
GFR calc non Af Amer: 55 mL/min — ABNORMAL LOW (ref >60)
GFR calc non Af Amer: 56 mL/min — ABNORMAL LOW (ref >60)
GFR calc non Af Amer: >60 mL/min (ref >60)
GFR calc non Af Amer: >60 mL/min (ref >60)
Glucose, Bld: 107 mg/dL — ABNORMAL HIGH (ref 70–99)
Glucose, Bld: 111 mg/dL — ABNORMAL HIGH (ref 70–99)
Glucose, Bld: 120 mg/dL — ABNORMAL HIGH (ref 70–99)
Glucose, Bld: 99 mg/dL (ref 70–99)
Potassium: 3.9 mEq/L (ref 3.5–5.1)
Potassium: 3.9 mEq/L (ref 3.5–5.1)
Potassium: 4.2 mEq/L (ref 3.5–5.1)
Potassium: 4.5 mEq/L (ref 3.5–5.1)
Potassium: 4.9 mEq/L (ref 3.5–5.1)
Potassium: 5.2 mEq/L — ABNORMAL HIGH (ref 3.5–5.1)
Sodium: 132 mEq/L — ABNORMAL LOW (ref 135–145)
Sodium: 132 mEq/L — ABNORMAL LOW (ref 135–145)
Sodium: 135 mEq/L (ref 135–145)
Sodium: 135 mEq/L (ref 135–145)

## 2010-10-23 LAB — POCT I-STAT 3, ART BLOOD GAS (G3+)
TCO2: 24 mmol/L (ref 0–100)
pCO2 arterial: 31 mmHg — ABNORMAL LOW (ref 35.0–45.0)
pH, Arterial: 7.479 — ABNORMAL HIGH (ref 7.350–7.400)
pO2, Arterial: 77 mmHg — ABNORMAL LOW (ref 80.0–100.0)

## 2010-10-23 LAB — PROTIME-INR
INR: 1.07 (ref 0.00–1.49)
INR: 1.08 (ref 0.00–1.49)
INR: 1.09 (ref 0.00–1.49)
INR: 1.12 (ref 0.00–1.49)
INR: 1.15 (ref 0.00–1.49)
INR: 1.15 (ref 0.00–1.49)
INR: 1.41 (ref 0.00–1.49)
Prothrombin Time: 14.1 seconds (ref 11.6–15.2)
Prothrombin Time: 14.2 seconds (ref 11.6–15.2)
Prothrombin Time: 14.3 seconds (ref 11.6–15.2)
Prothrombin Time: 14.7 seconds (ref 11.6–15.2)
Prothrombin Time: 15.6 seconds — ABNORMAL HIGH (ref 11.6–15.2)
Prothrombin Time: 17.5 seconds — ABNORMAL HIGH (ref 11.6–15.2)

## 2010-10-23 LAB — POCT I-STAT 4, (NA,K, GLUC, HGB,HCT)
HCT: 31 % — ABNORMAL LOW (ref 36.0–46.0)
Hemoglobin: 10.5 g/dL — ABNORMAL LOW (ref 12.0–15.0)

## 2010-10-23 LAB — TROPONIN I: Troponin I: 0.04 ng/mL (ref 0.00–0.06)

## 2010-10-23 LAB — TYPE AND SCREEN
ABO/RH(D): A POS
Antibody Screen: NEGATIVE

## 2010-10-23 LAB — BRAIN NATRIURETIC PEPTIDE: Pro B Natriuretic peptide (BNP): 1754 pg/mL — ABNORMAL HIGH (ref 0.0–100.0)

## 2010-10-23 LAB — DIGOXIN LEVEL: Digoxin Level: 2 ng/mL (ref 0.8–2.0)

## 2010-10-29 ENCOUNTER — Inpatient Hospital Stay (HOSPITAL_COMMUNITY)
Admission: AD | Admit: 2010-10-29 | Discharge: 2010-11-06 | DRG: 243 | Disposition: A | Discharge status: Home Health | Payer: Medicare Other | Source: Other Acute Inpatient Hospital | Attending: Cardiology | Admitting: Cardiology

## 2010-10-29 DIAGNOSIS — I2510 Atherosclerotic heart disease of native coronary artery without angina pectoris: Secondary | ICD-10-CM | POA: Diagnosis present

## 2010-10-29 DIAGNOSIS — Z91199 Patient's noncompliance with other medical treatment and regimen due to unspecified reason: Secondary | ICD-10-CM

## 2010-10-29 DIAGNOSIS — Z9119 Patient's noncompliance with other medical treatment and regimen: Secondary | ICD-10-CM

## 2010-10-29 DIAGNOSIS — Z79899 Other long term (current) drug therapy: Secondary | ICD-10-CM

## 2010-10-29 DIAGNOSIS — I4891 Unspecified atrial fibrillation: Secondary | ICD-10-CM | POA: Diagnosis present

## 2010-10-29 DIAGNOSIS — J4489 Other specified chronic obstructive pulmonary disease: Secondary | ICD-10-CM | POA: Diagnosis present

## 2010-10-29 DIAGNOSIS — R0902 Hypoxemia: Secondary | ICD-10-CM | POA: Diagnosis present

## 2010-10-29 DIAGNOSIS — Z7901 Long term (current) use of anticoagulants: Secondary | ICD-10-CM

## 2010-10-29 DIAGNOSIS — I052 Rheumatic mitral stenosis with insufficiency: Secondary | ICD-10-CM | POA: Diagnosis present

## 2010-10-29 DIAGNOSIS — R599 Enlarged lymph nodes, unspecified: Secondary | ICD-10-CM | POA: Diagnosis present

## 2010-10-29 DIAGNOSIS — E785 Hyperlipidemia, unspecified: Secondary | ICD-10-CM | POA: Diagnosis present

## 2010-10-29 DIAGNOSIS — I509 Heart failure, unspecified: Secondary | ICD-10-CM | POA: Diagnosis present

## 2010-10-29 DIAGNOSIS — K219 Gastro-esophageal reflux disease without esophagitis: Secondary | ICD-10-CM | POA: Diagnosis present

## 2010-10-29 DIAGNOSIS — Z9981 Dependence on supplemental oxygen: Secondary | ICD-10-CM

## 2010-10-29 DIAGNOSIS — E119 Type 2 diabetes mellitus without complications: Secondary | ICD-10-CM | POA: Diagnosis present

## 2010-10-29 DIAGNOSIS — I10 Essential (primary) hypertension: Secondary | ICD-10-CM | POA: Diagnosis present

## 2010-10-29 DIAGNOSIS — J95811 Postprocedural pneumothorax: Secondary | ICD-10-CM | POA: Diagnosis not present

## 2010-10-29 DIAGNOSIS — J449 Chronic obstructive pulmonary disease, unspecified: Secondary | ICD-10-CM | POA: Diagnosis present

## 2010-10-29 DIAGNOSIS — Y831 Surgical operation with implant of artificial internal device as the cause of abnormal reaction of the patient, or of later complication, without mention of misadventure at the time of the procedure: Secondary | ICD-10-CM | POA: Diagnosis not present

## 2010-10-29 DIAGNOSIS — I495 Sick sinus syndrome: Principal | ICD-10-CM | POA: Diagnosis present

## 2010-10-29 DIAGNOSIS — Y9210 Unspecified residential institution as the place of occurrence of the external cause: Secondary | ICD-10-CM | POA: Diagnosis not present

## 2010-10-29 DIAGNOSIS — Z87891 Personal history of nicotine dependence: Secondary | ICD-10-CM

## 2010-10-30 LAB — RETICULOCYTES: Retic Count, Absolute: 23.2 10*3/uL (ref 19.0–186.0)

## 2010-10-30 LAB — LACTATE DEHYDROGENASE: LDH: 157 U/L (ref 94–250)

## 2010-10-30 LAB — DIRECT ANTIGLOBULIN TEST (NOT AT ARMC)
DAT, IgG: NEGATIVE
DAT, complement: NEGATIVE

## 2010-10-30 LAB — GLUCOSE, CAPILLARY: Glucose-Capillary: 110 mg/dL — ABNORMAL HIGH (ref 70–99)

## 2010-10-30 LAB — BASIC METABOLIC PANEL
BUN: 20 mg/dL (ref 6–23)
CO2: 28 mEq/L (ref 19–32)
Glucose, Bld: 120 mg/dL — ABNORMAL HIGH (ref 70–99)
Potassium: 3.9 mEq/L (ref 3.5–5.1)
Sodium: 136 mEq/L (ref 135–145)

## 2010-10-30 LAB — ANTIBODY SCREEN: Antibody Screen: NEGATIVE

## 2010-10-31 DIAGNOSIS — I495 Sick sinus syndrome: Secondary | ICD-10-CM

## 2010-10-31 LAB — PROTIME-INR
INR: 1.44 (ref 0.00–1.49)
Prothrombin Time: 17.7 seconds — ABNORMAL HIGH (ref 11.6–15.2)

## 2010-10-31 LAB — GLUCOSE, CAPILLARY
Glucose-Capillary: 111 mg/dL — ABNORMAL HIGH (ref 70–99)
Glucose-Capillary: 141 mg/dL — ABNORMAL HIGH (ref 70–99)

## 2010-11-01 ENCOUNTER — Inpatient Hospital Stay (HOSPITAL_COMMUNITY): Payer: Medicare Other

## 2010-11-01 LAB — GLUCOSE, CAPILLARY: Glucose-Capillary: 111 mg/dL — ABNORMAL HIGH (ref 70–99)

## 2010-11-02 ENCOUNTER — Inpatient Hospital Stay (HOSPITAL_COMMUNITY): Payer: Medicare Other

## 2010-11-02 LAB — GLUCOSE, CAPILLARY
Glucose-Capillary: 132 mg/dL — ABNORMAL HIGH (ref 70–99)
Glucose-Capillary: 156 mg/dL — ABNORMAL HIGH (ref 70–99)
Glucose-Capillary: 139 mg/dL — ABNORMAL HIGH (ref 70–99)

## 2010-11-02 LAB — PROTIME-INR
INR: 2 — ABNORMAL HIGH (ref 0.00–1.49)
Prothrombin Time: 22.8 seconds — ABNORMAL HIGH (ref 11.6–15.2)

## 2010-11-03 ENCOUNTER — Inpatient Hospital Stay (HOSPITAL_COMMUNITY): Payer: Medicare Other

## 2010-11-03 LAB — GLUCOSE, CAPILLARY
Glucose-Capillary: 142 mg/dL — ABNORMAL HIGH (ref 70–99)
Glucose-Capillary: 118 mg/dL — ABNORMAL HIGH (ref 70–99)

## 2010-11-04 ENCOUNTER — Inpatient Hospital Stay (HOSPITAL_COMMUNITY): Payer: Medicare Other

## 2010-11-04 LAB — GLUCOSE, CAPILLARY
Glucose-Capillary: 113 mg/dL — ABNORMAL HIGH (ref 70–99)
Glucose-Capillary: 147 mg/dL — ABNORMAL HIGH (ref 70–99)
Glucose-Capillary: 135 mg/dL — ABNORMAL HIGH (ref 70–99)
Glucose-Capillary: 163 mg/dL — ABNORMAL HIGH (ref 70–99)

## 2010-11-04 LAB — BASIC METABOLIC PANEL
BUN: 27 mg/dL — ABNORMAL HIGH (ref 6–23)
GFR calc Af Amer: 58 mL/min — ABNORMAL LOW (ref >60)
GFR calc non Af Amer: 48 mL/min — ABNORMAL LOW (ref >60)
Potassium: 4.7 mEq/L (ref 3.5–5.1)
Sodium: 137 mEq/L (ref 135–145)

## 2010-11-04 LAB — CBC
HCT: 33.2 % — ABNORMAL LOW (ref 36.0–46.0)
Hemoglobin: 11.1 g/dL — ABNORMAL LOW (ref 12.0–15.0)
MCHC: 33.4 g/dL (ref 30.0–36.0)
RBC: 3.45 MIL/uL — ABNORMAL LOW (ref 3.87–5.11)
WBC: 8 10*3/uL (ref 4.0–10.5)

## 2010-11-04 LAB — PROTIME-INR
INR: 2.44 — ABNORMAL HIGH (ref 0.00–1.49)
Prothrombin Time: 26.6 seconds — ABNORMAL HIGH (ref 11.6–15.2)

## 2010-11-05 ENCOUNTER — Inpatient Hospital Stay (HOSPITAL_COMMUNITY): Payer: Medicare Other

## 2010-11-05 DIAGNOSIS — J4489 Other specified chronic obstructive pulmonary disease: Secondary | ICD-10-CM

## 2010-11-05 DIAGNOSIS — R0902 Hypoxemia: Secondary | ICD-10-CM

## 2010-11-05 DIAGNOSIS — J95811 Postprocedural pneumothorax: Secondary | ICD-10-CM

## 2010-11-05 DIAGNOSIS — J449 Chronic obstructive pulmonary disease, unspecified: Secondary | ICD-10-CM

## 2010-11-05 LAB — PROTIME-INR
INR: 2.39 — ABNORMAL HIGH (ref 0.00–1.49)
Prothrombin Time: 26.2 seconds — ABNORMAL HIGH (ref 11.6–15.2)

## 2010-11-05 LAB — GLUCOSE, CAPILLARY: Glucose-Capillary: 118 mg/dL — ABNORMAL HIGH (ref 70–99)

## 2010-11-06 ENCOUNTER — Telehealth: Payer: Self-pay | Admitting: Nurse Practitioner

## 2010-11-06 LAB — PROTIME-INR: INR: 2.37 — ABNORMAL HIGH (ref 0.00–1.49)

## 2010-11-06 LAB — GLUCOSE, CAPILLARY
Glucose-Capillary: 135 mg/dL — ABNORMAL HIGH (ref 70–99)
Glucose-Capillary: 146 mg/dL — ABNORMAL HIGH (ref 70–99)

## 2010-11-06 NOTE — Consult Note (Signed)
[name], [name] NO.:  [id number]  MEDICAL RECORD NO.:  [id number]           PATIENT TYPE:  I  LOCATION:  [id number]                         FACILITY:  [facility]  PHYSICIAN:  Rigoberto Noel, MD      DATE OF BIRTH:  [birth year]  DATE OF CONSULTATION:  11/05/2010 DATE OF DISCHARGE:                                CONSULTATION   REQUESTING PHYSICIANS: 1. Deboraha Sprang, MD, [facility] 2. [name], NP, [facility]. 3. Dr. Koleen Nimrod, pulmonologist.  REASON FOR CONSULTATION:  Hypoxia.  NOTE:  This is a brief summary of the consult note.  For details, look at the consult note in the chart.  HISTORY OF PRESENT ILLNESS:  Ms. Freiermuth is a pleasant 76 year old Caucasian woman who quit smoking about 3 years ago.  She has been diagnosed with COPD and has seen Dr. Koleen Nimrod in the past.  She is maintained on an excellent regimen of Advair b.i.d. and Spiriva with albuterol for rescue.  She also reports that she was placed on home oxygen after her hospitalization about a year ago but states that she got better and has not been using this consistently.  She was admitted for what appears to be tachybrady syndrome and required a permanent pacemaker from a left subclavian route.  This was complicated by a left pneumothorax.  Chest x-ray today on November 05, 2010 shows very small less than 5% apical pneumothorax.  When Physical Therapy ambulated her, she was found to desat to 85-83% on room air and recovered within 3-1/2 minutes on 2 L nasal cannula to sats more than 90%.  She was asymptomatic for this episode and hence we are consulted.  PAST MEDICAL HISTORY: 1. COPD. 2. Diabetes. 3. CHF. 4. Atrial fibrillation/sick sinus syndrome. 5. Nonischemic cardiomyopathy with an EF of 35-40% and abnormal chest     CT scan has been noted in the past with mediastinal and hilar     lymphadenopathy which is being followed up by Dr. Ouida Sills with     question of sarcoidosis, GERD, and  hyperlipidemia.  MEDICATIONS:  As noted in chart.  PHYSICAL EXAMINATION:  VITAL SIGNS:  Today, she was afebrile in the last 24 hours.  Her blood pressure is 110/72, heart rate 60, respirations 20, oxygen saturation 94% on 2 L nasal cannula. HEENT:  No cervical lymph node. CVS:  S1-S2 regular. CHEST:  Decreased breath sounds bilaterally.  No rhonchi or crackles. ABDOMEN:  Soft and nontender. NEUROLOGIC:  Nonfocal. EXTREMITIES:  No edema and no clubbing.  LABORATORY DATA:  WBC count was 8.  BUN and creatinine was 27/1.1.  INR was 2.4.  Chest x-ray as noted above.  IMPRESSION:  She does seem to have some hypoxia at baseline, I do not know if this was only exertional or nocturnal but she does seem to have been set up with home oxygen at home.  The current worsening could be related to the pneumothorax complicating the pacemaker placement.  She certainly does not seem to have any bronchospasm suggesting a chronic obstructive pulmonary disease flare.  The cause of the mediastinal and hilar lymphadenopathy is unclear, but is apparently being followed  by Dr. Koleen Nimrod.  I did not have any details of chest CT at [facility]. Accordingly, I would suggest: 1. We could discharge her on 2 L nasal cannula and she has been set up     already.  A followup chest x-ray should be performed in 1-2 weeks     for resolution of pneumothorax. 2. She will get back on her regimen of Advair and Spiriva. 3. She can follow up with Dr. Koleen Nimrod for both her chronic     obstructive pulmonary disease, pneumothorax, and mediastinal and     hilar lymph nodes that have been noted in the past.  We would be happy to see her in the future if needed.     Rigoberto Noel, MD     RVA/MEDQ  D:  11/05/2010  T:  11/06/2010  Job:  [id number]  cc:   Deboraha Sprang, MD, [facility]  Electronically Signed by Kara Mead MD on 11/06/2010 11:41:36 AM

## 2010-11-06 NOTE — Telephone Encounter (Signed)
pts GD called re: pacer site pain s/p placement yesterday and d/c earlier today.  Pain is 6/10 despite taking tylenol 100mg  approx. 6hrs ago and ibuprofen 800mg  approx 4 hrs ago.  I called in Rx for Vicodin 5/500 1 po q 6hprn pain to [place], #20, zero refills.

## 2010-11-12 NOTE — Discharge Summary (Addendum)
[name], [name] NO.:  [id number]  MEDICAL RECORD NO.:  [id number]           PATIENT TYPE:  I  LOCATION:  [id number]                         FACILITY:  [facility]  PHYSICIAN:  Deboraha Sprang, MD, FACCDATE OF BIRTH:  12/14/1934  DATE OF ADMISSION:  10/29/2010 DATE OF DISCHARGE:  11/06/2010                              DISCHARGE SUMMARY   DISCHARGE DIAGNOSES: 1. Tachycardia-bradycardia syndrome, status post single chamber St.     Jude Accent SR pacemaker model [id number], serial number [id number] on     October 31, 2010, by Dr. Caryl Comes. 2. Atrial fibrillation, anticoagulated with Coumadin. 3. Nonischemic cardiomyopathy with normalization of ejection fraction     to 60% to 65%, right  transesophageal echocardiography October     2011, previously 35% to 40%. 4. Mild nonobstructive coronary artery disease by cath August 26,     [id number], complicated by large left rectus sheath hematoma, status post     evacuation, Coumadin recently restarted without evidence of     recurrence. 5. Diabetes mellitus. 6. Abnormal chest CT scan with mediastinal and hilar lymphadenopathy,     followed by Dr. Roxan Hockey in [place]. 7. Gastroesophageal reflux disease. 8. Hyperlipidemia. 9. Moderate mitral stenosis with mild-to-moderate mitral regurgitation     and moderately reduced right ventricular function by cath on August     2011. 10.Chronic obstructive pulmonary disease. 11.Bradycardia.  HOSPITAL COURSE:  [name] is a 76 year old female with a history of atrial flutter/atrial fibrillation, nonobstructive coronary artery disease recently improved nonischemic cardiomyopathy with EF of 60% to 65%, and bradycardia upon discontinuation of calcium channel-blockers and beta-blockade in the past who presented to the [facility] with complaints of alternating rapid and slow heart rate, without permanent rate control.  Dr. Dannielle Burn initially saw her in consultation, and felt that she would  benefit from EP consultation and was thus transferred over to [facility] for further evaluation.  Dr. [name] evaluated the patient and felt that pacemaker was in fact indicated.  She underwent implantation of the [place] device by Dr. Caryl Comes, October 31, 2010.  Her Coumadin, which had been held prior to the procedure with heparin crossover, was restarted.  Initial followup chest x-ray, the following morning demonstrated that she had a pneumothorax with 2.5 cm of pleural parenchymal displacement.  This was followed by serial x-rays, which did demonstrate improvement down to 10%.  Her lab work remained stable.  She was fairly asymptomatic. However, unfortunately while ambulating with PT, dropped her O2 sats to 83% on room air.  O2 was placed at 2 L and it took about 3-1/2 minutes for it return to greater than 90%.  This was discussed with Dr. Caryl Comes and her discharge was held in anticipation of a pulmonary consult.  The patient has stated that she was not on home oxygen, but upon further investigation by Pulmonary, we found out that she actually is on home oxygen at 2 L, but self discontinued because she thought she did not need them anymore.  She was felt to have hypoxia at baseline without any bronchospasm suggesting COPD flare.  They recommended to discharge home on 2  L of nasal cannula, which she has already been set up for, and to have a followup chest x-ray in 1-2 weeks to follow up with Dr. Roxan Hockey which has been arranged.  This morning, she was doing well without complaints.  Dr. Caryl Comes has seen and examined her today and feels she is stable for discharge.  Dr. Caryl Comes does not feel that she needs wound checked since it has been checked here.  DISCHARGE LABORATORY DATA:  WBC 8.0, hemoglobin 11.1, hematocrit 32.2, platelet count 233.  INR 2.37.  BMET, sodium 137, potassium 4.0, chloride 103, CO2 is 24, glucose 116, BUN 27, creatinine 0.12.  STUDIES: 1. Most  recent chest x-ray on November 05, 2010, showed small left apical     pneumothorax, stable to slightly smaller, minimal atelectasis at     the left lung base with small pleural effusion was obtained. 2. Implantation of a single-chamber pacemaker by Dr. Caryl Comes on October 31, 2010, please see full report for details.  DISCHARGE MEDICATIONS: 1. Digoxin 0.125 mg daily, we will need to follow up with level given     renal insufficiency/age as an outpatient. 2. Diltiazem 120 mg daily. 3. Silver tartrate 25 mg q.8 h. 4. Accolate 20 mg b.i.d. 5. Advair Diskus 250/50 one puff inhaled b.i.d. 6. Ativan 0.5 mg q.i.d. p.r.n. anxiety. 7. Coumadin 1 mg Mondays to Thursday and 2 mg every day except Mondays     and Thursdays. 8. Folic acid 1 mg daily. 9. Furosemide 20 mg daily. 10.Metformin 500 mg b.i.d. 11.Omeprazole 40 mg every morning. 12.Simvastatin 10 mg at bedtime, given combination with diltiazem,     will need to recheck of lipids as an outpatient. 13.Spiriva 18 mcg daily.  DISPOSITION:  [name] will be discharged in stable condition to home.  She is instructed to increase activity slowly.  She is to keep her oxygen at 2 L.  She was given the pacemaker sheath and instructions with activity, wound care, and bathing.  She is to remove the Steri- Strips in 5 days. Because she was in the hospital for following the time postprocedurally, Dr. Caryl Comes feels that she can remove the Steri-Strips itself and does not necessarily need a wound check.  She will follow up with Dr. Dannielle Burn on December 03, 2010, for wound care.  She will follow up with Dr. Rayann Heman on February 06, 2011, at 2:45 p.m.  She will see Dr. Koleen Nimrod on November 28, 2010, at 2:45 p.m.  She will have her first outpatient INR check on November 11, 2010, with Dr. Stanton Kidney Martin's office at 3 p.m., phone number [phone].  She is also given a lab slip to take it to the [facility] for a chest x-ray between November 12, 2009, and November 19, 2010, to ensure  stability of her pneumothorax.  DURATION OF DISCHARGE ENCOUNTER:  Greater than 30 minutes including physician and PA time.     Melina Copa, P.A.C.   ______________________________ Deboraha Sprang, MD, [facility]    DD/MEDQ  D:  11/06/2010  T:  11/07/2010  Job:  [id number]  cc:   Thompson Grayer, MD Ernestine Mcmurray, MD,FACC Revonda Standard Roxan Hockey, M.D. [name], M.D.  Electronically Signed by Melina Copa  on 11/12/2010 02:17:31 PM Electronically Signed by Virl Axe MD [facility] on 12/03/2010 08:48:06 AM

## 2010-11-17 ENCOUNTER — Telehealth: Payer: Self-pay | Admitting: *Deleted

## 2010-11-17 ENCOUNTER — Ambulatory Visit: Payer: Medicare Other | Admitting: *Deleted

## 2010-11-20 ENCOUNTER — Encounter: Payer: Self-pay | Admitting: Cardiology

## 2010-11-23 ENCOUNTER — Emergency Department (HOSPITAL_COMMUNITY): Payer: Medicare Other

## 2010-11-23 ENCOUNTER — Emergency Department (HOSPITAL_COMMUNITY)
Admission: EM | Admit: 2010-11-23 | Discharge: 2010-11-23 | Disposition: A | Discharge status: Home / Self Care | Payer: Medicare Other | Attending: Emergency Medicine | Admitting: Emergency Medicine

## 2010-11-23 DIAGNOSIS — R609 Edema, unspecified: Secondary | ICD-10-CM | POA: Insufficient documentation

## 2010-11-23 DIAGNOSIS — J4489 Other specified chronic obstructive pulmonary disease: Secondary | ICD-10-CM | POA: Insufficient documentation

## 2010-11-23 DIAGNOSIS — Z79899 Other long term (current) drug therapy: Secondary | ICD-10-CM | POA: Insufficient documentation

## 2010-11-23 DIAGNOSIS — F411 Generalized anxiety disorder: Secondary | ICD-10-CM | POA: Insufficient documentation

## 2010-11-23 DIAGNOSIS — I10 Essential (primary) hypertension: Secondary | ICD-10-CM | POA: Insufficient documentation

## 2010-11-23 DIAGNOSIS — E119 Type 2 diabetes mellitus without complications: Secondary | ICD-10-CM | POA: Insufficient documentation

## 2010-11-23 DIAGNOSIS — Z7901 Long term (current) use of anticoagulants: Secondary | ICD-10-CM | POA: Insufficient documentation

## 2010-11-23 DIAGNOSIS — J189 Pneumonia, unspecified organism: Secondary | ICD-10-CM | POA: Insufficient documentation

## 2010-11-23 DIAGNOSIS — I509 Heart failure, unspecified: Secondary | ICD-10-CM | POA: Insufficient documentation

## 2010-11-23 DIAGNOSIS — R002 Palpitations: Secondary | ICD-10-CM | POA: Insufficient documentation

## 2010-11-23 DIAGNOSIS — J449 Chronic obstructive pulmonary disease, unspecified: Secondary | ICD-10-CM | POA: Insufficient documentation

## 2010-11-23 DIAGNOSIS — I4891 Unspecified atrial fibrillation: Secondary | ICD-10-CM | POA: Insufficient documentation

## 2010-11-23 LAB — CBC
HCT: 30.3 % — ABNORMAL LOW (ref 36.0–46.0)
MCHC: 32.7 g/dL (ref 30.0–36.0)
RDW: 14.3 % (ref 11.5–15.5)

## 2010-11-23 LAB — BASIC METABOLIC PANEL
BUN: 19 mg/dL (ref 6–23)
GFR calc non Af Amer: 49 mL/min — ABNORMAL LOW (ref >60)
Glucose, Bld: 109 mg/dL — ABNORMAL HIGH (ref 70–99)
Potassium: 4.5 mEq/L (ref 3.5–5.1)

## 2010-11-23 LAB — PROTIME-INR
INR: 2.67 — ABNORMAL HIGH (ref 0.00–1.49)
Prothrombin Time: 28.5 seconds — ABNORMAL HIGH (ref 11.6–15.2)

## 2010-12-01 ENCOUNTER — Encounter: Payer: Self-pay | Admitting: *Deleted

## 2010-12-01 ENCOUNTER — Ambulatory Visit (INDEPENDENT_AMBULATORY_CARE_PROVIDER_SITE_OTHER): Payer: Medicare Other | Admitting: *Deleted

## 2010-12-01 DIAGNOSIS — I4891 Unspecified atrial fibrillation: Secondary | ICD-10-CM

## 2010-12-01 DIAGNOSIS — I2510 Atherosclerotic heart disease of native coronary artery without angina pectoris: Secondary | ICD-10-CM

## 2010-12-01 DIAGNOSIS — R002 Palpitations: Secondary | ICD-10-CM

## 2010-12-01 MED ORDER — ALPRAZOLAM 0.25 MG PO TABS: ORAL_TABLET | ORAL | Status: DC

## 2010-12-01 NOTE — Patient Instructions (Signed)
   Xanax 0.25mg  - may take one tab every 6 hours as needed for palpitations and/or anxiety  May take an extra 1/2 Metoprolol as needed palpitations  Keep already scheduled visit for Wednesday

## 2010-12-02 ENCOUNTER — Encounter: Payer: Self-pay | Admitting: Cardiology

## 2010-12-02 ENCOUNTER — Telehealth: Payer: Self-pay | Admitting: *Deleted

## 2010-12-02 NOTE — Telephone Encounter (Signed)
Pt states she walked into the office yesterday d/t heart racing. She states she saw Edd Fabian and another nurse yesterday and had EKG/vitals done. She states Dr. Lutricia Feil gave her a prescription for Xanax yesterday. She states her heart is still racing today. She states her HR goes from 73-99. She states the highest her HR has been is 105. She states she took 2 Toprol and 2 Xanax today and it's still fast. Her whole body is shaking all over, and she has been in bed all day.   Pt has appt scheduled with Dr. Lutricia Feil tomorrow. Pt notified if she does not feel she can wait to be evaluated tomorrow at office visit, she need to go to ER tonight for evaluation. Pt verbalized understanding.

## 2010-12-03 ENCOUNTER — Ambulatory Visit (INDEPENDENT_AMBULATORY_CARE_PROVIDER_SITE_OTHER): Payer: Medicare Other | Admitting: Cardiology

## 2010-12-03 ENCOUNTER — Encounter: Payer: Self-pay | Admitting: Cardiology

## 2010-12-03 VITALS — BP 130/82 | HR 73 | Ht 63.0 in | Wt 176.0 lb

## 2010-12-03 DIAGNOSIS — D649 Anemia, unspecified: Secondary | ICD-10-CM

## 2010-12-03 DIAGNOSIS — I495 Sick sinus syndrome: Secondary | ICD-10-CM | POA: Insufficient documentation

## 2010-12-03 DIAGNOSIS — I2510 Atherosclerotic heart disease of native coronary artery without angina pectoris: Secondary | ICD-10-CM

## 2010-12-03 DIAGNOSIS — Z950 Presence of cardiac pacemaker: Secondary | ICD-10-CM

## 2010-12-03 DIAGNOSIS — R52 Pain, unspecified: Secondary | ICD-10-CM

## 2010-12-03 DIAGNOSIS — F419 Anxiety disorder, unspecified: Secondary | ICD-10-CM | POA: Insufficient documentation

## 2010-12-03 DIAGNOSIS — R002 Palpitations: Secondary | ICD-10-CM

## 2010-12-03 DIAGNOSIS — I050 Rheumatic mitral stenosis: Secondary | ICD-10-CM

## 2010-12-03 DIAGNOSIS — Z7901 Long term (current) use of anticoagulants: Secondary | ICD-10-CM | POA: Insufficient documentation

## 2010-12-03 DIAGNOSIS — I4891 Unspecified atrial fibrillation: Secondary | ICD-10-CM

## 2010-12-03 DIAGNOSIS — I509 Heart failure, unspecified: Secondary | ICD-10-CM

## 2010-12-03 MED ORDER — DILTIAZEM HCL ER COATED BEADS 180 MG PO CP24: 180.0000 mg | ORAL_CAPSULE | ORAL | Status: DC

## 2010-12-03 MED ORDER — ALPRAZOLAM 0.25 MG PO TABS: ORAL_TABLET | ORAL | Status: DC

## 2010-12-03 MED ORDER — CLONAZEPAM 0.5 MG PO TABS: 0.5000 mg | ORAL_TABLET | Freq: Two times a day (BID) | ORAL | Status: DC

## 2010-12-03 MED ORDER — HYDROCODONE-ACETAMINOPHEN 5-500 MG PO TABS: 1.0000 | ORAL_TABLET | Freq: Four times a day (QID) | ORAL | Status: DC | PRN

## 2010-12-03 NOTE — Progress Notes (Signed)
HPI The patient is a 76 year old female recently discharged from [facility] for tachybradycardia syndrome status post single chamber St. Jude pacemaker March 23 by Dr. Cleda Mccreedy. The patient is in chronic atrial fibrillation and is on Coumadin. She has a nonischemic cardiomyopathy with normalization of ejection fraction to 60-65%. She also has mild nonobstructive coronary disease by catheterization [id number] complicated by large left rectus sheath hematoma status post evacuation. Coumadin was temporarily held. The patient also has diabetes mellitus The patient has an abnormal CT scan with mediastinal hilar lymphadenopathy and is followed by Dr. Koleen Nimrod he needed. Reportedly the patient also has moderate mitral stenosis with mild to moderate mitral regurgitation and moderately reduced right ventricular function by catheterization August 2011. The finding of mitral stenosis was by cardiac catheterization. Hemodynamics by cardiac catheterization were as follows: Right atrial pressure mean of 14.  RV pressure 47/9 with an EDP of 13.  PA pressure 48/28 with a mean of 38.  Pulmonary capillary wedge pressure mean of 27 with a V-wave of 35-40.  Central aortic pressure 115/71 with a mean of 89.  LV pressure 119/14 with an EDP in the 15-20 range.  There was no aortic stenosis.  The mitral valve gradient was mean of 12.5 mmHg.  The calculated mitral valve area of 1.47 centimeter squared. The patient also had mild nonobstructive coronary artery disease and this was in August of 2011  However the patient had a TEE done which showed that her left atrium is moderate to severely dilated but there was no evidence of mitral stenosis and there was mild mitral regurgitation.. She does have significant atheromatous disease of the aorta. I did do a bedside echocardiogram and the patient does not have a pericardial effusion. She doest appear to have mild mitral stenosis. Her LV function is normal. The patient's  granddaughter is with her and she also states that when she goes over to her grandmother she never documented elevated heart rate. The patient feels that she has palpitations and she has been to the emergency room 4 times but has never been any evidence of tachycardia palpitations or rapid heart rates. Her EKG also today shows atrial fibrillation with rate control and EKG with magnet that's within normal limits with a normal magnet rate. She does have some shortness of breath on exertion which is also anemic with a hemoglobin of 10. According to the patient's granddaughter the patient is quite anxious and at times seems afraid of dying. She does appears to be depressed however she does complain of pain around her pacemaker although I examined the pacemaker pocket and is completely within normal limits. However the patient feels that she needs something stronger for pain medication.  Allergies  Allergen Reactions  . Tramadol Nausea Only    Current Outpatient Prescriptions on File Prior to Visit  Medication Sig Dispense Refill  . digoxin (LANOXIN) 0.125 MG tablet Take 125 mcg by mouth daily.        . Fluticasone-Salmeterol (ADVAIR DISKUS) 250-50 MCG/DOSE AEPB Inhale 1 puff into the lungs every 12 (twelve) hours.        . metFORMIN (GLUCOPHAGE) 500 MG tablet Take 500 mg by mouth 2 (two) times daily with a meal.        . metoprolol tartrate (LOPRESSOR) 25 MG tablet Take 25 mg by mouth 3 (three) times daily.        [name] omeprazole (PRILOSEC) 40 MG capsule Take 40 mg by mouth daily.        [name] tiotropium (  SPIRIVA) 18 MCG inhalation capsule Place 18 mcg into inhaler and inhale daily.        [name] warfarin (COUMADIN) 2 MG tablet Take 2 mg by mouth daily.        . zafirlukast (ACCOLATE) 20 MG tablet Take 20 mg by mouth 2 (two) times daily.        [name] DISCONTD: ALPRAZolam Duanne Moron) 0.25 MG tablet May take one tab every 6 hours as needed for palpitations / anxiety  30 tablet  0  . DISCONTD: furosemide (LASIX) 20 MG  tablet Take 20 mg by mouth daily.        [name] doxycycline (DORYX) 100 MG DR capsule Take 100 mg by mouth 2 (two) times daily.        [name] DISCONTD: diltiazem (DILACOR XR) 180 MG 24 hr capsule Take 180 mg by mouth daily.        [name] DISCONTD: LORazepam (ATIVAN) 0.5 MG tablet Take 0.5 mg by mouth every 6 (six) hours as needed.        [name] DISCONTD: simvastatin (ZOCOR) 5 MG tablet Take 5 mg by mouth at bedtime.          Past Medical History  Diagnosis Date  . Permanent atrial fibrillation     with tachybrady.  [name] CAD (coronary artery disease) 2011      CARDIAC CATHETERIZATION   . Palpitations   . Chronic airway obstruction, not elsewhere classified   . Type II or unspecified type diabetes mellitus without mention of complication, not stated as uncontrolled   . Anemia, unspecified   . Chronic kidney disease, stage II (mild)   . Unspecified hypertensive kidney disease with chronic kidney disease stage I through stage IV, or unspecified   . Other specified cardiac dysrhythmias   . Bradycardia   . GERD (gastroesophageal reflux disease)   . Undiagnosed cardiac murmurs   . Congestive heart failure, unspecified     Past Surgical History  Procedure Date  . Single-chamber pacemaker implantation   . Evacuation of retroperitoneal and left rectus sheath     Family History  Problem Relation Age of Onset  . Cancer Mother     Uterus  . Cancer Daughter     kidney cancer, in remission  . Heart disease Father     History   Social History  . Marital Status: Single    Spouse Name: N/A    Number of Children: 2  . Years of Education: N/A   Occupational History  . RETIRED    Social History Main Topics  . Smoking status: Former Smoker -- 2.0 packs/day for 45 years    Types: Cigarettes    Quit date: 08/10/2006  . Smokeless tobacco: Not on file  . Alcohol Use: No  . Drug Use: No  . Sexually Active: Not on file   Other Topics Concern  . Not on file   Social History Narrative  . No narrative on  file   Review of systems:Pertinent positives as outlined above. The remainder of the 18  point review of systems is negative   PHYSICAL EXAM BP 130/82  Pulse 73  Ht 5\' 3"  (1.6 m)  Wt 176 lb (79.833 kg)  BMI 31.18 kg/m2  General: Well-developed, well-nourished in no distress Head: Normocephalic and atraumatic Eyes:PERRLA/EOMI intact, conjunctiva and lids normal Ears: No deformity or lesions Mouth:normal dentition, normal posterior pharynx Neck: Supple, no JVD.  No masses, thyromegaly or abnormal cervical nodes Lungs: Normal breath sounds bilaterally without wheezing.  Normal  percussion Chest: Pacemaker site well healed. Cardiac: Irregular rate and rhythm with normal S1 and S2, no S3 or S4.  PMI is normal.  No pathological murmurs Abdomen: Normal bowel sounds, abdomen is soft and nontender without masses, organomegaly or hernias noted.  No hepatosplenomegaly MSK: Back normal, normal gait muscle strength and tone normal Vascular: Pulse is normal in all 4 extremities Extremities: No peripheral pitting edema Neurologic: Alert and oriented x 3 Skin: Intact without lesions or rashes Lymphatics: No significant adenopathyPsychologic: Normal affect  ECG:EKG without magnet atrial fibrillation rate control heart rate 69 beats per minute with occasional ventricular pacing. EKG with magnet heart rate 100 beats per minute. ASSESSMENT AND PLAN

## 2010-12-03 NOTE — Patient Instructions (Signed)
   Vicodin 5/500 - may take one tab every 6 hours as needed for severe pain   Stop Ativan  Clonazepam 0.5mg  - take twice a day    Okay to still use the Xanax, but only as needed  Diltiazem CD 180mg  - take one tab every morning Follow up in 3 months.  See appointment above. Pacemaker interrogation before end of week.

## 2010-12-03 NOTE — Op Note (Signed)
  NAMESHEREN, [name] NO.:  [id number]  MEDICAL RECORD NO.:  [id number]           PATIENT TYPE:  I  LOCATION:  [id number]                         FACILITY:  [facility]  PHYSICIAN:  Deboraha Sprang, MD, FACCDATE OF BIRTH:  07-Nov-1934  DATE OF PROCEDURE:  10/31/2010 DATE OF DISCHARGE:                              OPERATIVE REPORT   PREOPERATIVE DIAGNOSIS:  Permanent atrial fibrillation with tachybrady.  POSTOPERATIVE DIAGNOSIS:  Permanent atrial fibrillation with tachybrady.  PROCEDURE:  Single-chamber pacemaker implantation.  COMPLICATIONS:  None apparent.  Following obtaining informed consent, the patient was brought to the electrophysiology laboratory and placed on the fluoroscopic table in supine position.  After routine prep and drape of the left upper chest, lidocaine was infiltrated in prepectoral subclavicular region.  An incision was made and carried down to layer of the prepectoral fascia using electrocautery.  With sharp dissection, a pocket was formed similarly.  Hemostasis was obtained.  Thereafter, attention was turned to gain access to the extrathoracic left subclavian vein which was accomplished without difficulty without the aspiration of air or puncture of the artery.  A single venipuncture was accomplished.  Guidewire was placed and retained and a 7-French sheath was placed through which was passed a 7 [place], 58- cm passive fixation ventricular lead, serial [id number].  Under fluoroscopic guidance, it was manipulated into the right ventricular apex where the bipolar R-wave was 13 with a pace impedance of 685 and threshold of 0.5 at 0.5.  There was no diaphragmatic pacing at 10 volts. The lead was secured to the prepectoral fascia and attached to a St. Jude Accent SR pulse generator, serial [id number], model [id number]. Intermittent ventricular pacing was identified.  The pocket was copiously irrigated with antibiotic-containing saline  solution.  The lead and pulse generator were secured to the prepectoral fascia. Hemostasis was obtained and the wound was then closed in 3 layers in normal fashion.  The wound was washed and dried.  Benzoin and Steri- Strip dressing were applied.  Needle count, sponge counts, and instrument counts were correct at the end of the procedure according to staff.  The patient tolerated the procedure without apparent complication.     Deboraha Sprang, MD, [facility]     SCK/MEDQ  D:  10/31/2010  T:  11/01/2010  Job:  [id number]  Electronically Signed by Virl Axe MD [place] on 12/03/2010 08:48:08 AM

## 2010-12-03 NOTE — Assessment & Plan Note (Signed)
No objective evidence that the patient has any rapid ventricular response. However because she feels increased palpitations I will increase her Cardizem CD 280 mg by mouth daily. Also her pacemaker interrogation done to make sure that the patient's rate responsiveness is not set too sensitive.

## 2010-12-03 NOTE — Assessment & Plan Note (Signed)
:   I do think that is a large component of anxiety and I will start the patient on clonazepam 0.5 mg by mouth twice a day. We'll discontinue Ativan. She does still take Xanax when necessary as needed. We will see her back in 3 months. Also given the patient a prescription for pain medication with Vicodin 5/500 mg by every 6 hours when necessary severe pain.

## 2010-12-04 ENCOUNTER — Ambulatory Visit (INDEPENDENT_AMBULATORY_CARE_PROVIDER_SITE_OTHER): Payer: Medicare Other | Admitting: *Deleted

## 2010-12-04 ENCOUNTER — Other Ambulatory Visit: Payer: Self-pay

## 2010-12-04 DIAGNOSIS — Z950 Presence of cardiac pacemaker: Secondary | ICD-10-CM

## 2010-12-04 DIAGNOSIS — I4891 Unspecified atrial fibrillation: Secondary | ICD-10-CM

## 2010-12-04 NOTE — Progress Notes (Signed)
Pacer check by industry

## 2010-12-19 ENCOUNTER — Telehealth: Payer: Self-pay | Admitting: Physician Assistant

## 2010-12-19 NOTE — Telephone Encounter (Signed)
Returned call from pt's granddaughter, Olivia Mackie concerning pain meds.  Pt has been taking 4 vicodin a day for chronic pain.  She has ran out of her Rx and had called for refills.  I informed the pt she should only be taking this pills for severe pain.  She says she has severe pain all the time, all over her body.  Dr. Dannielle Burn had refilled her rx in April.  I have told her I will give her enough for the weekend but she needs to follow up with Dr. Dannielle Burn for further instruction.  She voiced understanding.    I have called in Vicodin 5-500mg  1 tab q6 hours prn pain #12 0RF.

## 2010-12-20 NOTE — Telephone Encounter (Signed)
Further refills on Vicodin and should be handled by her primary care physician.

## 2010-12-23 NOTE — Assessment & Plan Note (Signed)
[facility] OFFICE NOTE   JENAY, DEHAAN                    MRN:          [id number]  DATE:01/18/2007                            DOB:          1935-07-22    HOSPITAL NOTE   Covering hospitalization from June 2 to January 18, 2007.  This patient has  no known drug allergies.   PRESENTING CIRCUMSTANCE:  The patient presents with weakness, cough,  dyspnea, which is progressive, severe anemia with hemoglobin of 7.9, INR  3.0.  This patient has persistent atrial flutter with variable  conduction and controlled rates.  The main thrust of this  hospitalization was to rule out a cause of internal bleeding.   To this extent, the patient's Coumadin was held, and the patient was  provided with cross-coverage with IV heparin after the INR had gone to  less than 2.0.  The patient underwent EGD June 9, which showed  esophagitis, but no unplanned intervention was required.  The patient  then, the same day, underwent colonoscopy.  This showed evidence of  diverticulosis, but no intervention was required.  No complications of  the study.  Coumadin was restarted on June 10.   PLAN:  1. Continue the patient on antibiotic coverage, which had been started      here with Avelox.  2. The patient was to restart Coumadin therapy.  3. The patient was to have followup CBC at [facility],      as well as followup [place] at the [facility], and to      present to Dr. Dannielle Burn in preparation for cardioversion on      amiodarone therapy.   The patient discharging January 18, 2007 on:  1. Coumadin 1 mg daily until she sees the Coumadin Clinic Monday June      16 at 8:45.  2. Amiodarone 200 mg tablets 2 tabs daily.  3. Diltiazem 240 mg daily.  4. Lipitor 20 mg daily at bedtime.  5. Lisinopril 40 mg daily.  6. Accolate 20 mg twice daily.  7. Enteric-coated aspirin 81 mg daily.  8. Nexium 400 mg daily.  9. Avelox  400 mg daily for the next 3 days.  10.Spiriva 18 mcg per inhalation 1 inhalation daily.  11.Albuterol inhaler 2 puffs every 4 hours.  12.Vitamin B12 100 mg injected every month.   Followup appointments at [facility].  1. Coumadin Clinic Monday June 16 at 8:45.  2. Blood work at the [facility] Wednesday June      25 in the morning.  3. To see Dr. Dannielle Burn at [facility] Thursday June 26 at 1:15      p.m. to prepare for DCCV of her atrial flutter on an increased dose      of amiodarone at 400 mg daily.     Sueanne Margarita, PA  Electronically Signed    GM/MedQ  DD: 01/18/2007  DT: 01/18/2007  Job #: [id number]   cc:   Ernestine Mcmurray, MD,FACC

## 2010-12-23 NOTE — Assessment & Plan Note (Signed)
OFFICE VISIT   [name], DEMUS  DOB:  Mar 05, 1935                                       05/01/2010  [id number]   The patient returns for follow-up today.  She had evacuation of a  retroperitoneal and left rectus hematoma on 04/11/2010.  Prior to the  operation, she had been on Coumadin, aspirin and Plavix.  She is  currently off all of these.  She states she has had a small amount of  drainage from the incision over the last 24 to 48 hours.  It has been  clear in character.  She denies any purulent drainage.  She states that  she  thought her temperature might have been up a little bit last night  but she has not had any shaking chills.   PHYSICAL EXAMINATION:  Today, blood pressure is 138/79 in the left arm,  heart rate is 119 and regular.  Temperature is 98.5.  Left lower  quadrant incision has some areas where the staples have tethered the  skin and there is some erythema around this with a small amount of  serous drainage.  She also on palpation seems to have had some  reaccumulation of fluid beneath the incision.  Whether or not this  represents a small reaccumulation of the hematoma is difficult to  determine.  However, she does not appear to have a deep seated infection  and does not appear to have a wound infection either.  All other staples  were discontinued today.  I believe the erythema around her staples and  the drainage should improve significantly now that the staples are out.  She will follow up on an as-needed basis.  If she continues to have  difficulties with her wound, she will follow up on an as-needed basis.  Currently we are not restarting her Coumadin.  If she has reaccumulated  her hematoma I would think that probably long-term she is not going to  be a candidate for any further Coumadin.  I will leave this at Dr.  Arlina Robes discretion in the long-term.     Jessy Oto. Fields, MD  Electronically Signed   CEF/MEDQ  D:   05/01/2010  T:  05/02/2010  Job:  [id number]   cc:   Ernestine Mcmurray, MD,FACC

## 2010-12-23 NOTE — Discharge Summary (Signed)
NAMESHEYLIN, ROERS NO.:  [id number]   MEDICAL RECORD NO.:  [id number]          PATIENT TYPE:  INP   LOCATION:  5508                         FACILITY:  [facility]   PHYSICIAN:  Sharlet Salina, M.D.   DATE OF BIRTH:  1935/03/14   DATE OF ADMISSION:  09/26/2007  DATE OF DISCHARGE:  09/29/2007                               DISCHARGE SUMMARY   DISCHARGE DIAGNOSES:  1. Pneumonia.  2. Chronic obstructive pulmonary disease.  3. Atrial fibrillation.  4. Congestive heart failure.  5. Chronic obstructive pulmonary disease.  6. Diabetes.  7. Hypertension.  8. History of previous pneumonia.  9. Tobacco abuse.  10.Heart murmur.   DISCHARGE MEDICATIONS:  1. Avelox 400 mg daily times seven days.  2. Coumadin as directed.  3. Aspirin 81 mg daily.  4. Nexium 40 mg daily.  5. Lisinopril 40 mg daily.  6. Albuterol p.r.n.  7. Diltiazem 240 mg daily.  8. Lipitor 40 mg daily.  9. Metformin 500 mg b.i.d.  10.Accolate 20 mg b.i.d.   FOLLOWUP APPOINTMENT:  The patient is to follow up with Dr. Laurance Flatten within  a week.  The patient is to have a chest x-ray repeated as an outpatient  to evaluate for clearing of pneumonia.   PROCEDURE:  None.   CONSULTANTS:  None.   HPI:  The patient is a 76 year old female admitted four times for  pneumonia in 2008. She was well until five days ago when she started  coughing up green mucous with fever, chills, weakness, sinus congestion.  She denies any palpitations or hemoptysis. No chest pain. Please seen  admission note for remainder of the history.   PAST MEDICAL HISTORY, FAMILY HISTORY, SOCIAL HISTORY, MEDS, ALLERGIES  AND REVIEW OF SYSTEMS:  Per admission H and P.   PHYSICAL EXAMINATION ON DISCHARGE:  Temperature 98.1. Pulse 71.  Respirations 18. Blood pressure 119/75. Pulse ox 98% on room air. CBG 94  to 116.  HEENT: Normocephalic atraumatic. Pupils are reactive to light.  No erythema. CARDIOVASCULAR: Regular rate and rhythm.  LUNGS:  Clear  bilaterally. ABDOMEN: Positive bowel sounds.  EXTREMITIES: Without  edema.   HOSPITAL COURSE:  1. Pneumonia: The patient was admitted to the hospital and placed on      IV Rocephin and Zithromax. Her symptoms improved. She was      discharged home on Avelox for seven days. She felt fine. Oxygen      level 99% on room air prior to discharge.  2. Atrial fibrillation:  She was continued on Coumadin while she was      in the hospital. INR was therapeutic.  3. COPD: Her COPD remained stable while she was in the hospital.  4. Diabetes: Blood sugar remained stable in the hospital   DISCHARGE LABS:  PT 29.1. INR 2.6. PTT 63. Urine culture negative. Blood  cultures negative times two. BNP of 305. TSH 3.616. Hemoglobin A1C 6.2.  CK 35. MB 0.8. Troponin 0.01. Chest x-ray showed mild CHF, right middle  lobe pneumonia. Follow up imaging suggested complete resolution and to  rule out underlying malignancy. This was discussed with the patient and  will see the patient's primary care physician.      Sharlet Salina, M.D.  Electronically Signed     NJ/MEDQ  D:  09/29/2007  T:  09/30/2007  Job:  [id number]

## 2010-12-23 NOTE — Assessment & Plan Note (Signed)
[name] OFFICE NOTE   [name], [name]                    MRN:          [id number]  DATE:02/03/2008                            DOB:          [birth year]    REFERRING PHYSICIAN:  [name]   The patient is a 76 year old female with a history of recurrent atrial  arrhythmias including atrial fibrillation and flutter.  She is on  Coumadin.  The patient has been doing well.  She has good rate control.  She denies any chest pain, shortness of breath, orthopnea, or PND.   CURRENT MEDICATIONS:  1. Lipitor 4 mg p.o. b.i.d.  2. Diltiazem 240 mg q.a.m.  3. Vitamin B12.  4. Fish oil.  5. Accolate.  6. Nexium.  7. Lisinopril 40 mg daily.  8. Aspirin 81 mg p.o. daily.  9. Metformin.   PHYSICAL EXAMINATION:  VITAL SIGNS:  Blood pressure is 140/83, heart  rate 73, and weight is 164 pounds.  NECK: Normal carotid upstroke.  No carotid bruits.  LUNGS:  Clear breath sounds bilaterally.  HEART:  Irregular rate and rhythm with normal sinus.  ABDOMEN:  Soft and nontender.  EXTREMITIES:  There is no cyanosis, clubbing, or edema.   PROBLEM LIST:  1. Recurrent atrial arrhythmia.  2. Preserved left ventricular function.  3. Chronic Coumadin use.  4. History of pneumonitis secondary to amiodarone use.  5. Valvular heart disease with moderate mitral regurgitation.  6. Atypical chest, but negative ischemia workup.   PLAN:  The patient to continue on current medical regimen and PT/INR  will be checked today.  EKG was reviewed.  This appears to be a  __________.  The patient also reported no recurrent palpitations.     Ernestine Mcmurray, MD,FACC  Electronically Signed    GED/MedQ  DD: 02/03/2008  DT: 02/04/2008  Job #: [id number]   cc:   [name], MD

## 2010-12-23 NOTE — Assessment & Plan Note (Signed)
[facility] OFFICE NOTE   JULY, CZEPIEL                    MRN:          [id number]  DATE:07/04/2007                            DOB:          1934-10-30    REFERRING PHYSICIAN:  [name]   HISTORY OF PRESENT ILLNESS:  The patient is a 76 year old female with a  history of recurrent atrial arrhythmias (history of atrial flutter and  currently atrial fibrillation) as well as mitral regurgitation, COPD,  and hypertension.  The patient is status post a failed atypical atrial  flutter ablation in March 2008.  She was then placed on amiodarone, but  developed pneumonitis (lung toxicity) secondary to drug use.  This was  discontinued, and clinically, the patient has much improved.  Her  shortness of breath appears to be resolving.  The patient was also seen  recently by Dr. [name] and Cardizem was increased for better rate  control.  It was Dr. Tanna Furry plan to up-titrate her medications but not  to consider any high risk EP procedure at the present time.   The patient states she is doing well.  She denies any chest pain,  palpitations, or syncope.   MEDICATIONS:  1. Accolate 20 mg p.o. b.i.d.  2. Nexium 40 mg p.o. daily.  3. Lisinopril 40 mg p.o. daily.  4. Coumadin as directed, PT and INR to be checked today.  5. Aspirin 81 mg p.o. daily.  6. Metformin 500 mg p.o. b.i.d.  7. Lipitor 40 mg p.o. q. a.m.  8. Diltiazem 240 mg p.o. q. a.m.  9. Vitamin B12 as directed.   PHYSICAL EXAMINATION:  VITAL SIGNS:  Blood pressure 104/57, heart rate  is 83, weight 157 pounds.  NECK:  Normal carotid upstroke and no carotid bruits.  LUNGS:  Diminished breath sounds with few scattered rhonchi at the  bases.  HEART:  Irregular rate and rhythm.  Normal S1, S2.  ABDOMEN:  No rebound or guarding.  Good bowel sounds.  EXTREMITIES:  No cyanosis, clubbing, or edema.  NEURO:  Alert and oriented, grossly nonfocal.   PROBLEM LIST:  1. Recurrent atrial arrhythmia.      a.     Atrial fibrillation with a poorly controlled ventricular       response during exertion.      b.     History of atypical atrial flutter status post unsuccessful       radiofrequency ablation in March 2008.      c.     Status post successful DC cardioversion with restoration of       normal sinus rhythm on amiodarone.      d.     Discontinuation of amiodarone secondary to toxicity, now       recurrence of atrial fibrillation.  2. Preserved left ventricular function.  3. Chronic Coumadin use.  4. Pneumonitis, see above.  5. Valvular heart disease.      a.     Moderate mitral regurgitation and mild aortic stenosis.  6. Atypical chest pain.      a.     Negative adenosine Cardiolite study,  ejection fraction 60%       April 2008.  7. History of upper gastrointestinal bleed.  8. Chronic obstructive pulmonary disease, __________ .   PLAN:  1. The patient will have a followup chest x-ray done and pulmonary      function test.  She was unable to perform this due to a missed      appointment.  2. Clinically, the patient appears to be improving.  Will continue to      withhold amiodarone, for which the patient is not a future      candidate any more.  3. Will continue medical treatment of her atrial fibrillation and the      patient will walk in the hall today.  We will assess whether we      need to up-titrate her Cardizem prior to the addition of digoxin or      beta blocker.  4. Will consider also Holter monitor to assess rate control.  5. The patient will follow up with me in 6 months.     Ernestine Mcmurray, MD,FACC  Electronically Signed    GED/MedQ  DD: 07/04/2007  DT: 07/04/2007  Job #: [id number]   cc:   Dr. Darrell Jewel. [name], MD

## 2010-12-23 NOTE — Assessment & Plan Note (Signed)
[facility]                         ELECTROPHYSIOLOGY OFFICE NOTE   MIN, COPELAND                    MRN:          [id number]  DATE:06/29/2007                            DOB:          11-28-1934    The patient returns today for follow-up.  She is a very pleasant patient  of Ernestine Mcmurray, MD,FACC, who is 76 years old and has a history of  recurrent atrial arrhythmias as well as mitral regurgitation, COPD, and  hypertension.  The patient underwent an EP study and was found to have  atypical left atrial flutter back in march of 2008.  At that time, we  placed her on some amiodarone and she unfortunately developed  pneumonitis on amiodarone with her amiodarone discontinued.  She had a  difficult time maintaining sinus rhythm despite the amiodarone and would  often revert back to flutter.  The patient states that since her  amiodarone was stopped, her dyspnea has improved.  She does still have  no palpitations.  Her Diltiazem has been decreased appropriately when  she was in sinus rhythm secondary to underlying sinus node dysfunction.   MEDICATIONS:  1. Metformin 500 mg twice daily.  2. Coumadin as directed.  3. Lipitor 40 mg a day.  4. Nexium 40 mg a day.  5. Diltiazem 120 mg a day.  6. Lisinopril 40 mg a day.  7. Aspirin 81 mg a day.   PHYSICAL EXAMINATION:  GENERAL:  She is a pleasant, chronically ill-  appearing woman in no acute distress.  VITAL SIGNS:  Blood pressure 150/85, pulse 85 and irregular, and  respirations 18.  Weight 158 pounds.  NECK:  No jugular venous distention, no thyromegaly.  Trachea was  midline.  Carotids were 2+ and symmetric.  LUNGS:  Clear bilaterally except for rales in the bases bilaterally.  No  wheezes or rhonchi present.  CARDIOVASCULAR:  Regular rate and rhythm with normal S1 and S2.  The PMI  was not laterally displaced or enlarged.  ABDOMEN:  Soft, nontender, and nondistended.  No organomegaly.  EXTREMITIES:  No cyanosis, clubbing, or edema.  Pulses were 2+ and  symmetric.   EKG demonstrates atypical atrial flutter with a controlled ventricular  rate.   Today we had the patient walk in the hall on pulse oximetry.  Her  initial heart rate was in the 90's and her oxygen saturation was 93%.  With walking once around our office, the oxygen saturation remained in  the mid 90's, however, her heart rate increased into the 120's to 130  range.  With this, she got dyspneic.   IMPRESSION:  1. Symptomatic atrial flutter.  2. History of atrial fibrillation.  3. History of hypertension.  4. Status post EP study with atypical flutter being documented.  5. Pneumonitis on amiodarone, now improved off amiodarone.   DISCUSSION:  I have discussed the treatment options with the patient and  her daughter who is with her today.  One option will be to proceed with  a high risk left atrial ablation procedure.  Secondly and the option I  think is most appropriate for this patient  would be to up-titrate her  Cardizem now and consider additional up-titration as time goes on.  To  this end we will increase her Cardizem from 120 to 240 mg daily.  She  may also require digoxin and/or beta blockers.  We will plan to see her  back in several months.     Champ Mungo. [name], MD  Electronically Signed    GWT/MedQ  DD: 06/29/2007  DT: 06/30/2007  Job #: [id number]   cc:   Ernestine Mcmurray, MD,FACC

## 2010-12-23 NOTE — Assessment & Plan Note (Signed)
[facility] OFFICE NOTE   WALKER, MEYERSON                    MRN:          [id number]  DATE:02/03/2007                            DOB:          May 16, 1935    PRIMARY CARDIOLOGIST:  Dr. Terald Sleeper.   REASON FOR VISIT:  Post hospital followup.   Ms. Wallenhorst is a 76 year old female last seen here in the clinic on  May 15 by myself and Dr. Dannielle Burn with plans at that time to proceed with  a 2nd attempt at DC cardioversion for her persistent, symptomatic  atypical atrial flutter.  As previously noted, she had undergone an  unsuccessful attempt at radiofrequency ablation this past March as well  as a subsequent attempt at DC cardioversion which was initially  successful, but with subsequent reversion back to atrial flutter.  At  that time, she was discharged from [facility] on an amiodarone loading  regimen.   During her initial post hospital followup on April 4, amiodarone dose  was up titrated to 400 mg daily wherein she remained when last seen here  in the clinic.  In the intervening time, the patient has been  hospitalized on 2 separate occasions for treatment of pneumonia.  The  1st was here at [facility] and she was discharged on May 22.  However, she  then presented to [facility] (June 2-10) and was treated for  community-acquired pneumonia as well as anemia.  Regarding the latter,  she presented with a hemoglobin of 7.9 requiring transfusion and  underwent extensive evaluation by the GI team with both an upper  endoscopy as well as a colonoscopy.  These were notable for finding of a  small esophageal erosion at the GE junction by upper endoscopy, and  diffuse diverticuloses with no acute source of bleeding by colonoscopy.  Followup Hemoccults were negative prior to discharge.   The patient was also seen briefly by our team in consultation with  recommendation to continue amiodarone at the  current dose, and to resume  scheduled followup with plans to proceed with the 2nd attempt at DC  cardioversion as we had initially outlined when last seen here in the  clinic.   The patient informs me today that she had blood work drawn yesterday and  was told that her anemia is now okay.  Clinically, she feels much  better and has no further dyspnea.  She appears to be somewhat mildly  symptomatic with her tachy palpitations.   CURRENT MEDICATIONS:  1. Amiodarone 400 daily.  2. Metformin 500 b.i.d.  3. Spiriva.  4. Diltiazem 240 daily.  5. Lipitor 20 daily.  6. Aspirin 81 daily.  7. Coumadin 2.5 as directed.  8. Lisinopril 40 daily.  9. Nexium 40 daily.  10.Accolate 20 b.i.d.   INR today is 1.9.   PHYSICAL EXAMINATION:  Blood pressure 117/67, pulse 71, regular, weight  159.  GENERAL:  A 76 year old female, sitting upright in no distress.  HEENT:  Normocephalic, atraumatic.  NECK:  Palpable carotid pulses without bruits, no JVD.  LUNGS:  Diminished breath sounds in bases, but with  no crackles or  wheezes.  HEART:  Irregularly irregular (S1, S2), no significant murmurs.  ABDOMEN:  Benign.  EXTREMITIES:  No significant edema.  NEURO:  No focal deficit.   IMPRESSION:  1. Persistent atypical atrial flutter.      a.     Status post unsuccessful RFCA attempt, March 2008.      b.     Status post initial successful DC cardioversion attempt, but       with subsequent reversion to atrial flutter requiring amiodarone       load.  2. Preserved left ventricular function.  3. Chronic Coumadin.  4. Status post recent upper GI bleed requiring multiple transfusions.  5. Chronic obstructive pulmonary disease, history of tobacco.  6. Valvular heart disease, moderate mitral regurgitation, mild aortic      stenosis/aortic insufficiency by 2D echo, August 2007.  7. History of atypical chest pain, negative adenosine Cardiolite,      April 2008.   PLAN:  1. Resume plans to proceed with  the 2nd attempt DC cardioversion, as      previously outlined.  Therefore, the patient will need weekly      Protime check and, if therapeutic after approximately 4 weeks, we      will proceed with scheduling her for a DC cardioversion with Dr.      Dannielle Burn.  2. Request recent blood work with respect to her anemia.     Gene Serpe, PA-C  Electronically Signed      Ernestine Mcmurray, MD,FACC  Electronically Signed   GS/MedQ  DD: 02/03/2007  DT: 02/03/2007  Job #: [id number]   cc:   Wannetta Sender. Rice, M.D.

## 2010-12-23 NOTE — H&P (Signed)
[name], JACKA NO.:  [id number]   MEDICAL RECORD NO.:  [id number]          PATIENT TYPE:  EMS   LOCATION:  [facility]JO                         FACILITY:  [facility]   PHYSICIAN:  Durwin Nora, MDDATE OF BIRTH:  1935/01/05   DATE OF ADMISSION:  09/26/2007  DATE OF DISCHARGE:                              HISTORY & PHYSICAL   PRIMARY CARE PHYSICIAN:  Chipper Herb, M.D.   She has no health care power of attorney.  She is a full code.   PRESENTING COMPLAINT:  Cough, fever, chills x5 days' duration.   HISTORY OF PRESENTING COMPLAINT:  This is a 76 year old lady requiring  admissions about four times for pneumonia in 2008, who was quite well  until five days ago when she started coughing productive of greenish-  yellow phlegm associated with fever, chills, weakness, sinus congestion,  and drainage.  She denies any palpitations.  No hemoptysis.  No chest  pain.  She has nocturia three times at night.  She has ankle edema.  No  facial swelling.  No new skin rash or joint swelling.  There are no  symptoms referral to the genitourinary or gastrointestinal systems.  No  syncopal episodes, no seizure.  The patient has smoked more than 45  years, a pack a day.  She quit in March, 2008.   PAST MEDICAL HISTORY:  1. Atrial fibrillation.  2. Congestive heart failure.  3. COPD.  4. Diabetes.  5. Hypertension.  6. Recurring pneumonia.  7. Tobacco abuse.  8. Heart murmur.   SOCIAL HISTORY:  Lives with her daughter.  No history of illicit drugs  or alcohol abuse.  Smoked one pack per day for more than 45 years.   FAMILY HISTORY:  Father had heart disease.  Mother had cancer of the  uterus.  Her daughter has cancer of the kidney.   PAST SURGICAL HISTORY:  Cholecystectomy.  Ventral hernia repair.   MEDICATIONS:  1. Coumadin 2.5 mg on Monday, Wednesday, and Friday, and 1 mg on      Tuesday, Thursday, Saturday, and Sunday.  2. Aspirin 81 mg daily.  3. Nexium 40 mg  daily.  4. Lisinopril 40 mg daily.  5. Albuterol 2 puffs q.6h. p.r.n.  6. Diltiazem 240 mg daily.  7. Lipitor 40 mg daily.  8. Metformin 500 mg b.i.d.  9. Accolate 20 mg b.i.d.   No known drug allergies   Fourteen systems reviewed, pertinent positives as in history of  presenting illness.   PHYSICAL EXAMINATION:  She is an elderly lady, not in acute respiratory  distress.  Temperature 99.4, pulse 88, ranging to 112, respiratory rate 18, blood  pressure 109/70.  Head is normocephalic and atraumatic.  Pupils are equal, round and  reactive to light and accommodation.  She is pale, not jaundiced.  There  is no cyanosis.  She is not tachypneic.  Mucous membranes are moist.  Neck is supple.  There is no elevated JVD or thyromegaly.  No carotid  bruit.  She has bibasilar rales, more on the left.  Heart sounds, S1 and S2, irregular.  ABDOMEN:  Distended old scar.  Soft, nontender.  No organomegaly.  Inguinal orifices are patent.  She is alert and oriented to time, place, and person.  Glasgow Coma  Score 15.  Power is 5+ globally.  Deep tendon reflexes are 2+ globally.  Peripheral pulses are present.  Positive pedal edema.  No clubbing or  cyanosis.  No skin rash.  There is no joint swelling.   LABS:  WBC 9, hematocrit 31, platelet count 351.  Urinalysis shows small  leukocyte esterase, WBCs 3-6.  Chemistry:  Sodium is 139, potassium 3.5,  chloride 105, BUN 13, creatinine 0.9, glucose 166.  Cardiac:  BMP is  587, INR 2.5.   Chest x-ray shows basilar infiltrates.   ASSESSMENT:  Pneumonia with underlying chronic obstructive pulmonary  disease, pulmonary congestion, congestive heart failure exacerbation,  atrial fibrillation with rapid ventricular rate, history of tobacco  abuse, uncontrolled diabetes, mild urinary tract infection.  Patient is  to be admitted to telemetry bed.  Will get a sputum culture, urine  culture, blood culture x2.  Start on IV Rocephin and Zithromax.  Will   continue with the home medications.  Patient will be on sliding scale  insulin, NovoLog, Accu-Cheks q.a.c. and nightly.  Put on Lantus insulin  5 units subcu nightly.  O2 2 liters nasal cannula p.r.n. to keep the  sats to greater than 90.  Duonebs 3 mg q.6h. p.r.n. for shortness of  breath.  Patient will have stool Hemoccult and one set of cardiac  enzymes.  Also obtain a TSH.  Check a PT/INR daily and keep INR  optimized, as it presently is.      Durwin Nora, MD  Electronically Signed     MIO/MEDQ  D:  09/26/2007  T:  09/27/2007  Job:  [id number]   cc:   Chipper Herb, M.D.

## 2010-12-23 NOTE — Discharge Summary (Signed)
[name], [name] NO.:  [id number]   MEDICAL RECORD NO.:  [id number]          PATIENT TYPE:  OBV   LOCATION:  [id number]                         FACILITY:  [facility]   PHYSICIAN:  Orpah Melter, MD       DATE OF BIRTH:  February 11, 1935   DATE OF ADMISSION:  05/11/2008  DATE OF DISCHARGE:  05/15/2008                               DISCHARGE SUMMARY   PRIMARY DISCHARGE DIAGNOSIS:  Community-acquired pneumonia.   SECONDARY DIAGNOSES:  1. Atrial fibrillation.  2. Chronic obstructive pulmonary disease.  3. Diabetes mellitus.  4. Hypertension.  5. Gastroesophageal reflux disease.  6. Hypercholesterolemia.  7. Anemia.   HOSPITAL COURSE BY PROBLEM:  1. Community-acquired pneumonia.  The patient is a 76 year old female      admitted with increasing shortness of breath.  The patient was seen      in the emergency department and was admitted to our service.  The      patient did have evidence of leukocytosis upon admission with      initial white count being normal but increasing to 13.5 during her      hospital stay.  The patient was placed on antibiotics.  She had      good clinical recovery.  She is now being discharged to home with      instructions to follow up with her primary care physician within 1      week if symptoms persist.  The patient says subjectively her      symptoms have abated.  She did have chest x-ray showing increased      left lower lobe opacity reflecting pneumonia.  The patient has a      previous history of COPD exacerbation and pneumonia.  2. Atrial fibrillation.  The patient has been relatively well rate      controlled while in-house and she will continue on her diltiazem      postdischarge.  3. COPD.  The patient will be sent home on prednisone taper with      instructions to follow taper until completed and then follow up      with primary care physician if needed for symptoms.  4. Diabetes mellitus.  The patient has moderate control while in-   house, will likely exacerbate it by steroid use, and we will not      adjust her diabetic medications with the idea that after her      steroid taper which is relatively rapid, she will not require      additional medication changes.  5. Hypertension, currently stable.  The patient has been relatively      well controlled throughout this hospitalization and no changes were      made to her blood pressure regimen.  6. Gastroesophageal reflux disease.  The patient will continue on      proton pump inhibitor.  7. Hypercholesterolemia.  The patient will continue on statin therapy.  8. Anemia.  The patient did have an elevated ferritin and while she      did have a low serum iron and elevated ferritin would indicate she  has adequate iron stores erythropoiesis and does not require iron      supplements at this time.   LABS OF NOTE:  1. Leukocytosis with initial white count of 9, increasing at 13.5 and      decreasing at 9.0 on day of discharge.  2. Anemia with hemoglobin of 9.1, hematocrit of 27.  3. INR slightly supratherapeutic with an INR of 3.1.  4. Reticulocyte count 1.7% indicating reactive reticulocytosis with a      ferritin level of 178 and serum iron of 75.  5. Mild increase in BUN at 24 with creatinine 0.95.  6. TSH of 0.294 with a free T4 of 1.12.   STUDIES:  Chest x-ray showing left lower lobe opacity.   MEDICATIONS ON DISCHARGE:  1. Coumadin 1 mg p.o. every other day until seen by primary care      physician starting on May 17, 2008.  2. Ciprofloxacin 500 mg p.o. daily x7 days.  3. Prednisone 30 mg p.o. daily x3, 20 mg p.o. daily x3, and 10 mg p.o.      daily x3.  4. Aspirin 81 mg p.o. daily.  5. Nexium 40 mg p.o. daily.  6. Lisinopril 40 mg p.o. daily.  7. Diltiazem SR 245 mg p.o. daily.  8. Lipitor 40 mg p.o. at bedtime.  9. Metformin 500 mg p.o. b.i.d.  10.Accolate 20 mg p.o. b.i.d.  11.Fish oil tablets 1 g p.o. daily.  12.Advair 100/50 one inhalation  b.i.d.  13.DualVit Plus 1 p.o. daily.   No labs or studies pending at the time of discharge.  The patient is in  stable condition and anxious for discharge.   Time spent 35 minutes.      Orpah Melter, MD  Electronically Signed     JF/MEDQ  D:  05/15/2008  T:  05/16/2008  Job:  [id number]   cc:   Chipper Herb, M.D.

## 2010-12-23 NOTE — Discharge Summary (Signed)
[name], ENSOR NO.:  [id number]   MEDICAL RECORD NO.:  [id number]          PATIENT TYPE:  INP   LOCATION:  2027                         FACILITY:  [facility]   PHYSICIAN:  Judie Bonus, MD  DATE OF BIRTH:  Jul 04, 1935   DATE OF ADMISSION:  01/10/2007  DATE OF DISCHARGE:  01/18/2007                               DISCHARGE SUMMARY   DISCHARGE DIAGNOSES:  1. Community-acquired pneumonia.  2. Anemia.  3. Diverticular disease.   REASON FOR ADMISSION:  1. Dyspnea and cough, acute problem.  2. Pneumonia.  3. Diverticulosis.  4. __________  chronic problem.  5. Diabetes mellitus type 2.  6. Hypertension.  7. Hyperlipidemia.  8. Chronic obstructive pulmonary disease.  9. Persistent atypical atrial flutter with variable conduction.   DISCHARGE MEDICATIONS:  1. Coumadin 1 mg one tablet daily.  2. Amiodarone 400 mg one tablet daily.  3. Diltiazem 240 mg daily.  4. Lipitor 20 mg daily.  5. Lisinopril 40 mg daily.  6. Accolate 20 mg daily.  7. Metformin 500 mg twice a day.  8. Aspirin 81 mg daily.  9. Nexium 40 mg daily.  10.Avelox 400 mg daily.  11.Spiriva 1 inhalation daily.  12.Albuterol 2 puffs every four hours as needed.  13.Vitamin B-12 injection 1000 mcg every month.   DISPOSITION:  Home.   APPOINTMENTS:  1. Coumadin clinic with [facility] at [facility] office on Monday,      January 24, 2007 at 8:45 a.m.  Phone number [phone].  This      appointment is to check PT INR.  2. Primary care [name].  Nurse practitioner Miss [name] at the      [facility] at [place] on February 03, 2007 at      10 a.m.  This is a follow up appointment for discharge from the      hospital to assess progress on routine antibiotics for community-      acquired pneumonia, as well as persistent anemia.  3. Cardiologist Dr. Dannielle Burn at [name] care at [facility] office on      February 03, 2007 at 1:15 p.m.  Phone number [phone].  This is to  evaluate treatment of atypical atrial flutter.   PROCEDURES PERFORMED:  Colonoscopy and upper endoscopy on January 17, 2007.   CONSULTATIONS:  1. Cardiology, Dr. Dannielle Burn.  2. Gastroenterology, [facility].   ADMITTING HISTORY AND PHYSICAL:  The patient is a 76 year old female  with a past medical history significant for atypical atrial flutter,  diabetes type 2, hypertension, dyslipidemia, COPD, and chronic lower  extremity edema, who was recently discharged from the hospital at  [name] for pneumonia.  The patient completed a course of  Levaquin, 5 days, partly at the hospital, partly as an outpatient.  She  came in on day of admission to the emergency room complaining of dyspnea  and cough for about 4 days.  At baseline, the patient can walk up to 1/2  mile without getting short of breath.  However, for the past 4 days, she  can only walk to the bathroom before getting short of  breath.  She also  complained of dry cough and chest tightness.  No frank chest pain.  No  wheezing.  No hemoptysis.  The patient denies orthopnea, but complains  of stable lower extremity edema without pain or erythema.  The patient  confirms chills, but no fevers.  The patient has no documented history  of coronary artery disease.  With her admitting workup, it was  discovered the patient is anemic.  The patient denies having bloody  stools or black stools.  Denies abdominal pain, nausea or vomiting,  dysuria, or syncope.   ALLERGIES:  NO KNOWN DRUG ALLERGIES.   PAST MEDICAL HISTORY:  1. Atypical atrial flutter/atrial fibrillation.  2. Chronic lower extremity edema.  3. Chronic obstructive pulmonary disease.  4. Hypertension.  5. Dyslipidemia.  6. Diabetes type 2.  7. Status post appendectomy and cholecystectomy.   MEDICATIONS:  1. Coumadin 2.5 mg.  2. Amiodarone 400 mg.  3. Diltiazem 240 mg.  4. Lipitor 20 mg.  5. Combivent 1 puff four times a day.  6. Aspirin 81.  7. Actos 15 mg.  8.  Accolate 20 mg b.i.d.  9. Lisinopril 40 mg.  10.Nexium 40 mg.  11.Ambien p.r.n.  12.Recently finished a course of Levaquin for community-acquired      pneumonia.   SOCIAL HISTORY:  The patient smoked for 45 years until she quit about 2  months ago.  She is single and lives with her granddaughter in  [place].  She is on Medicare.   FAMILY HISTORY:  Mother passed away of cancer.  Father had a MI at age  40.  She has 2 children who are healthy.   REVIEW OF SYSTEMS:  A 12-point review of systems is positive for  fatigue, heartburn, recent anorexia, nocturia, and occasional  palpitations.   PHYSICAL EXAMINATION:  VITAL SIGNS:  Temperature 98.4, blood pressure  150/69, pulse 107, respirations 26, 95 saturation on room air.  GENERAL:  The patient is lying in a hospital bed in no acute distress.  EYES:  PERRLA, EOMI, anicteric.  ENT:  Moist mucus membranes.  NECK:  Supple.  No thyromegaly or lymphadenopathy.  RESPIRATORY EXAM:  Diffuse crackles throughout the right lung field and  at left lower base.  No wheezing.  There is no increased work of  breathing.  CARDIOVASCULAR EXAM:  Irregularly irregular rhythm.  Mild systolic  murmur at left sternal border.  GI:  Positive bowel sounds.  Abdomen soft, nontender, nondistended.  No  hepatosplenomegaly.  Surgical scars.  EXTREMITIES:  1+ bilateral lower extremity edema up to the knees.  RECTAL EXAM:  Reveals normal rectal tone.  Formed brown stool in rectal  vault.  Fecal occult blood test positive.  NEUROLOGIC:  Alert and oriented times 3.  Cranial nerves II-XII are  grossly intact.  Strength 5/5 in all four extremities.  Reflexes 2+  throughout and symmetric.   ADMITTING LABS:  CBC:  White count 9, hemoglobin 9.4, platelets 378,000,  MCV 92.3.  RDW 14.8.  BMC 7.2.  Chemistries 138, 3.5, 105, 24, BUN 15,  creatinine 1.1.  INR 3.8.  BNP 433.  Point-of-care markers:  Myoglobin  94, troponin I less than 0.05, CK MB 1.5.  HOSPITAL COURSE  BY PROBLEM:  1. Cough and dyspnea.  This was felt most likely to be due to a      pneumonia.  Chest x-ray on January 10, 2007 confirmed bilateral      infiltrates, right more than left, with bilateral small pleural  effusions consistent with nosocomial pneumonia.  The patient      received 4 days of IV Zosyn and 5 days of p.o. Avelox for her      antibiotic therapy for this pneumonia.  The patient's dyspnea and      cough steadily improved on antibiotic regimen.  It was also felt      that some fluid in the lungs due to mild heart failure secondary to      the patient's atrial fibrillation was contributing to her dyspnea.      Therefore, the patient received 2 days of Lasix 40 mg daily.      Implementation of Lasix also improved the patient's lower extremity      edema.  However, the patient developed mild renal insufficiency      with creatinine going up to 1.36, this is up from 1.1 on admission      and Lasix therapy was stopped.  Creatinine decreased again to      normal range after cessation of Lasix therapy and the patient had      no other complications.  2. Anemia.  Anemia seems to have developed recently as her hemoglobin      was 11 in September of 2007, then it was 12.3 in March of this      year, then 8.7 in May on discharge from [name].  It was      9.4 on admission, and by the third hospital day, her hemoglobin was      down to 7.9.  The patient received 2 units of blood.  Her      hemoglobin went up to 9.9 after transfusion.  The patient received      an extensive workup for her anemia.  Hemolysis labs were all within      normal limits.  Peripheral smear showed no abnormalities.  Folate      levels were within normal limits.  B12 was borderline low at 238,      and the patient received a shot of B12 1000 mcg subcutaneously.      Iron studies were equivocal for iron deficiency.  Ferritin was      within normal limits at 189.  Total iron 22.  Total iron binding       capacity was low at 165, percent saturation 13.  Homocystine and      methylmalonic acid levels were within normal limits.  GI was      consulted to assess for a possible source of GI bleeding.  The      patient's most recent colonoscopy was at least 9 or 10 years ago,      and that report is not available.  The patient underwent a      colonoscopy during this hospitalization which revealed no acute      source of bleeding, but showed diffuse diverticulosis from      transverse colon down to sigmoid colon.  Upper endoscopy done at      the same time showed a small esophageal erosion at the GE junction.      We feel that intermittent bleeding connected with the patient's      diverticular disease could be the source of her anemia.  Fecal      occult blood test was positive on the day of admission.  However,      multiple subsequent fecal occult blood tests were negative during     this hospitalization.  The  patient will be followed with frequent      hemoglobin checks by her primary care physician to manage her      anemia.  3. Atrial fibrillation.  This has been stable for the past 10 months.      The patient failed radiofrequency ablation.  She was DC      cardioverted once, but reverted to abnormal rhythm shortly      thereafter.  The patient had been rate controlled on amiodarone and      Diltiazem.  This is managed by Dr. Dannielle Burn of [facility] cardiology.      Dr. Dannielle Burn had a plan to attempt another DC cardioversion on      patient as an outpatient.  The patient was discharged on Coumadin      and will be followed at the [name].  4. Anticoagulation.  When the patient was admitted, she was      subtherapeutic on her INR at 3.8.  We held her Coumadin and her INR      subsequently stabilized around 2.  She then had to be taken off her      Coumadin for the colonoscopy.  She was converted to Lovenox when      her INR reached 2, and colonoscopy was performed  when the INR was      1.6.  Heparin was discharged and Coumadin restarted the day after      her colonoscopy on day of discharge.  5. Dyslipidemia.  The patient had a fasting lipid panel during her      hospitalization and the LDL was 30.  We therefore discharged her on      Zocor.  Defer outpatient management to primary care physician.  6. Diabetes mellitus type 2.  The patient was admitted with a home      medication regimen of Actos 15 mg daily.  However, in light of her      possible heart failure component to her dyspnea, we discharged      Actos on admission.  The patient was covered by sliding scale      insulin for her meals while she was inpatient.  With CBGs in the      low to mid 100s.  The patient is being discharged on metformin 500      mg twice a day.  We feel at this time this is a better medication      for her diabetes than Actos given her heart disease.  7. Hypertension.  The patient's blood pressures were within normal      range on lisinopril 40 mg daily throughout this hospitalization.  8. COPD.  This was managed by Combivent and albuterol inhalers.  The      patient resumed her home regimen of Accolate upon discharge.   DISCHARGE VITALS:  Temperature 99.2, blood pressure 129/76, pulse 70 to  86 range, respirations 18, 97% saturation on room air.   DISCHARGE LABS:  CBC:  White blood count 5.7, hemoglobin 10.3, platelets  252,000.  INR 1.5.  PT 18.4.     ______________________________  Heide Guile    ______________________________  Judie Bonus, MD    OS/MEDQ  D:  01/18/2007  T:  01/18/2007  Job:  [id number]   cc:   Melynda Keller, MD,FACC

## 2010-12-23 NOTE — Assessment & Plan Note (Signed)
[place]                          [facility] OFFICE NOTE   PREM, MOYEDA                    MRN:          [id number]  DATE:04/07/2007                            DOB:          Feb 25, 1935    HISTORY OF PRESENT ILLNESS:  The patient is a 76 year old female with a  history of unsuccessful radiofrequency ablation March 2008.  The patient  had initial successful cardioversion attempt with subsequent reversion  to atrial flutter requiring amiodarone load.  The patient was then  brought back 2 weeks for repeat cardioversion.  Normal sinus rhythm was  restored.  The patient now tells me, however, that on Tuesday following  the Friday of the cardioversion she felt that she was back out of  rhythm.  She then feels her heart is racing at times and makes her feel  very uncomfortable.  There is some associated tightness.  She has been  worked up with a Cardiolite in the past, however, and this was negative  for ischemia.  Today in the office she is back in atrial flutter.  The  patient's INR level is 4.5 today.   MEDICATIONS:  1. Accolate.  2. Nexium.  3. Lisinopril.  4. Coumadin as directed.  5. Aspirin 81 mg a day.  6. Lipitor 20 mg p.o. q.h.s.  7. Amiodarone 400 mg p.o. daily.  8. Metformin.  9. Spiriva.  10.Diltiazem 120 mg p.o. q.a.m.   PHYSICAL EXAMINATION:  VITAL SIGNS:  Blood pressure 125/75, heart rate  93, weight is 162 pounds.  NECK:  Normal carotid upstroke and no carotid bruits.  LUNGS:  Clear breath sounds bilaterally.  HEART:  Irregular rate and rhythm with normal S1, S2, and a soft murmur  at the outflow tract.  LUNGS:  Clear.  ABDOMEN:  Soft, nontender.  No rebound or guarding, good bowel sounds.  EXTREMITIES:  No cyanosis, clubbing or edema.  NEUROLOGIC:  The patient is alert, oriented and grossly nonfocal.   PROBLEM LIST:  1. Recurrent atrial flutter.      a.     Status post unsuccessful radiofrequency catheter  ablation       attempt March 2008.      b.     Status post initially successful direct-current       cardioversion attempt but with subsequent reversion to atrial       flutter requiring amiodarone load.      c.     Repeat cardioversion on March 11, 2007, with restoration of       normal sinus rhythm.  2. Recurrent atrial flutter on office visit today.  3. Preserved left ventricular function.  4. Chronic Coumadin therapy.  5. History of upper gastrointestinal bleed requiring multiple      transfusions.  6. Chronic obstructive pulmonary disease with history of tobacco.  7. Valvular heart disease with moderate mitral regurgitation, mild      aortic stenosis, and aortic insufficiency.  8. Atypical chest pain with negative adenosine Cardiolite April 2008.   PLAN:  1. The patient is unfortunately back in atrial flutter.  I have told  the patient that we will reattempt to restore normal sinus rhythm      with repeat cardioversion.  2. If the patient does not hold with the next cardioversion then we      will plan to refer her back to Dr. Caryl Comes.  She does appear to be in      atrial flutter and I am wondering if repeat ablation may be an      option.  The patient may need to be referred also to a tertiary      referral center if needed.  We will leave the decision to Dr.      Caryl Comes.  3. PT/INR was checked in the office today and details as above.  4. The patient will follow up with [place] for repeat cardioversion and      hopefully now that she has been a longer time on amiodarone we have      a better chance of maintaining normal sinus rhythm.     Ernestine Mcmurray, MD,FACC  Electronically Signed    GED/MedQ  DD: 04/07/2007  DT: 04/08/2007  Job #: [id number]   cc:   Deboraha Sprang, MD, [facility]  Dr. [name]

## 2010-12-23 NOTE — Assessment & Plan Note (Signed)
[name]   [name]                    MRN:          [id number]  DATE:12/23/2006                            DOB:          [birth year]    PRIMARY CARDIOLOGIST:  Ernestine Mcmurray, MD, [facility].   REASON FOR VISIT:  Schedule clinic followup.   The patient returns for her third post-hospital visit since being  discharged from [facility] October 28, 2006, following an  attempted treatment of symptomatic atrial flutter.  Numerous attempts at  radiofrequency ablation were unsuccessful, and the patient was  subsequently successfully DC cardioverted to NSR and started on  amiodarone.  However, when she presented for post-hospital followup, she  was found to have reverted back to atrial flutter, and recommendation  was to uptitrate amiodarone to 400 daily, and consider a second attempt  at DC cardioversion.  She was thus placed on weekly checks for pro  times, and now presents in followup.   Clinically, the patient continues to report some mild exertional  dyspnea, but with no associated chest discomfort, and denies any  orthopnea or paroxysmal nocturnal dyspnea.  She has a history of lower  extremity edema, particularly worse on the left, but with no recent  exacerbation.  She also has occasional palpitations.   Electrocardiogram today reveals persistent atypical atrial flutter, with  variable conduction.   CURRENT MEDICATIONS:  1. Coumadin 2.5 mg as directed.  2. Amiodarone 400 mg daily.  3. Diltiazem 240 mg daily.  4. Lipitor 20 at bedtime.  5. Combivent 1 puff b.i.d.  6. Aspirin 81 daily.  7. Actos 15 daily.  8. Lisinopril 40 daily.  9. Nexium 40 daily.  10.Accolate 20 b.i.d.   PHYSICAL EXAMINATION:  VITAL SIGNS:  Blood pressure 126/71, pulse 76,  irregular, weight 164.2 (down 1 pound).  GENERAL:  A 76 year old female sitting upright, in no distress.  HEENT:  Edentulous.  NECK:  Palpable bilateral carotid pulses, without bruits, with distant  radiating murmur.  No JVD.  LUNGS:  Diminished breath sounds at the bases, particularly on the left,  but with no associated crackles.  HEART:  Irregularly irregular (S1, S2).  No significant murmurs.  ABDOMEN:  Benign.  EXTREMITIES:  1-2+ left lower extremity, nonpitting edema.  NEUROLOGIC:  No focal deficits.   IMPRESSION:  1. Persistent atypical atrial flutter, with variable conduction.  1A.  Status post unsuccessful radiofrequency ablation attempt, March  2008.  1B.  Status post initial successful direct current cardioversion to  normal sinus rhythm, with subsequent amiodarone load.  Subsequent  recurrence to atrial flutter.  1. History of preserved left ventricular function.  2. History of mild valvular heart disease.  3. Chronic Coumadin.  4. Chronic obstructive pulmonary disease.  5A.  Tobacco recently discontinued.  1. Atypical chest pain.  2. Negative adenosine Cardiolite, April 2008.  3. Chronic lower extremity edema.   PLAN:  Following review with Dr. Dannielle Burn, recommendation is to proceed  with a second attempt at DC cardioversion.  The patient's recent pro  times have been reviewed and show therapeutic INRs over the  preceding 3-  4 weeks.  We will arrange to schedule this within the next 2-3 weeks,  with Dr. Dannielle Burn.  Of note, however, if this second attempt is  unsuccessful, the amiodarone will be discontinued, and the patient will  be treated with rate control and chronic Coumadin.      Gene Serpe, PA-C       Ernestine Mcmurray, MD,FACC    GS/MedQ  DD: 12/23/2006  DT: 12/23/2006  Job #: [id number]   cc:   Wannetta Sender. Rice, M.D.

## 2010-12-23 NOTE — H&P (Signed)
[name], [name] NO.:  [id number]   MEDICAL RECORD NO.:  [id number]          PATIENT TYPE:  INP   LOCATION:  3742                         FACILITY:  [facility]   PHYSICIAN:  Sharlet Salina, M.D.   DATE OF BIRTH:  09-Aug-1935   DATE OF ADMISSION:  05/11/2008  DATE OF DISCHARGE:                              HISTORY & PHYSICAL   CHIEF COMPLAINT:  Shortness of breath.   HISTORY OF PRESENT ILLNESS:  The patient is a 76 year old white female  that presented to the emergency room secondary to shortness of breath  for about a week.  She normally has some shortness of breath at  baseline, but not this severe.  She states that she was trying to go to  the laundry room, she had to hold on because she was so weak and short  of breath.  She went to see her PCP 3 days ago and was placed on Z-Pak.  She has not seen any improvement.  Her granddaughter brought her to the  emergency room.  She has had no fever or chills at home.   PAST MEDICAL HISTORY:  1. Atrial fibrillation.  2. CHF.  3. COPD.  4. Diabetes.  5. Hypertension.  6. History of pneumonia in the past.  7. Heart murmur.   FAMILY HISTORY:  Father had heart disease.  Mother had cancer of the  uterus.  Her daughter has kidney cancer, now in remission.   SOCIAL HISTORY:  She lives with her granddaughter.  No history of  illicit drug or alcohol use.  She smoked about one pack per day for over  45 years; she quit over a year now.  She has 2 children.   MEDICATIONS:  1. Coumadin 2 mg daily.  2. Nexium 40 mg daily.  3. Lisinopril 40 mg daily.  4. Albuterol p.r.n.  5. Diltiazem 240 mg daily.  6. Lipitor 40 mg daily.  7. Metformin 500 mg b.i.d.  8. Accolate 20 mg b.i.d.  9. Fish oil 1000 mg daily.  10.Advair 100/50 b.i.d.  11.DualVit Plus daily.   ALLERGIES:  NO KNOWN DRUG ALLERGIES.   REVIEW OF SYSTEMS:  Negative, otherwise stated in the HPI.   PHYSICAL EXAMINATION:  Temperature 100.4, blood pressure 145/88,  pulse  131, respirations 20, pulse oximetry 92% on room air.  GENERAL:  The patient is lying on the stretcher; no accessory muscle  use.  HEENT:  Head is normocephalic, atraumatic.  Pupils reactive to light.  Throat without erythema.  CARDIOVASCULAR:  Regular rate and rhythm.  LUNGS:  She has wheezes throughout her lungs and some rhonchi.  ABDOMEN:  Soft, nontender, nondistended.  Positive bowel sounds.  EXTREMITIES:  No edema.   LABS:  Sodium 140, potassium 3.7, chloride 110, CO2 24, glucose 105, BUN  15, creatinine 0.91, calcium 8.9, PT 34.7, INR 3.1.  WBC 9, hemoglobin  9.9, MCV 96.5, platelets 251.  Chest x-ray showed mild bronchitic  changes; mild interstitial pneumonitis cannot be excluded.   ASSESSMENT/PLAN:  1. Chronic obstructive pulmonary disease exacerbation.  2. Fever.  3. Chronic atrial fibrillation.  4. History of congestive heart failure,  followed by cardiology.  5. Diabetes.  6. Hypertension.   Will admit the patient to the hospital, start her on IV Rocephin and  Zithromax.  We also will start on steroids and will give her Tamiflu  with her fever.  We will check H1N1 and start her on nebulizers.  Will  continue her home medications for diabetes and hypertension, and her  other chronic medications.  We will get blood cultures x2; will check a  urinalysis as well.  Will check a fasting lipid panel, BNP, hemoglobin  A1c.  Will put her on droplet precaution.  Sharlet Salina, M.D.  Electronically Signed     NJ/MEDQ  D:  05/11/2008  T:  05/11/2008  Job:  [id number]

## 2010-12-23 NOTE — Consult Note (Signed)
[name] NO.:  [id number]   MEDICAL RECORD NO.:  [id number]          PATIENT TYPE:  INP   LOCATION:  2027                         FACILITY:  [facility]   PHYSICIAN:  Marijo Conception. Wall, MD, FACCDATE OF BIRTH:  12-17-1934   DATE OF CONSULTATION:  DATE OF DISCHARGE:                                 CONSULTATION   CARDIOLOGY CONSULTATION:  We were asked by Dr. Lorretta Harp to consult on [name] for  persistent and recalcitrant atrial flutter.  Question of what should we  do, a DC cardioversion here in the hospital.   HISTORY OF PRESENT ILLNESS:  [name] is a pleasant 76 year old  chronically ill female who was admitted with shortness of breath and  cough.   It is noteworthy, she was just hospitalized at [facility] for pneumonia.   Her admission chest x-ray showed bilateral pleural effusions and a  slightly elevated BNP of 433.  She is in persistent atrial flutter and  has failed radio frequency ablation attempts in the past, as well as  cardioversion.   She was recently increased to 400 mg a day of amiodarone by Dr. Dannielle Burn  in [place].  I spoke with him in person today and he wants to continue on  this and instead of an outpatient DC cardioversion once she recovers  from this acute hospitalization.   Her hospitalization is also remarkable for severe anemia of 7.9.  Her  INR was 3.0.  She is remarkably hemoccult negative.   PAST MEDICAL HISTORY:  She has:  1. Normal left ventricular systolic function.  This was by a 2D echo,      January 12, 2007.  She has a mildly left atrium, mildly dilated right      ventricle, mildly depressed right ventricular function, mild to      moderate mitral regurgitation, mildly left ventricular hypertrophy,      moderate tricuspid regurgitation, mildly elevated pulmonary      pressures and mildly dilated right atrium.  2. She has severe chronic obstructive pulmonary disease.  3.  Hyperlipidemia.  4. Hypertension.  5. Gastroesophageal reflux disease.   PAST SURGICAL HISTORY:  She had:  1. Appendectomy.  2. Cholecystectomy.  3. Hernia repairs.   ALLERGIES:  NONE.   MEDICATIONS ON ADMISSION:  1. Coumadin.  2. Amiodarone 400 mg a day.  3. Diltiazem 240 mg a day.  4. Lipitor 20 mg p.o. q.h.s.  5. Combivent 1 puff b.i.d.  6. Aspirin 81 mg a day.  7. Actos 15 mg a day.  8. Lisinopril 40 mg a day.  9. Nexium 40 mg a day.  10.Accolate 20 mg b.i.d.  11.Zosyn 3.375 q.6 hours.  12.She has been given Lasix 40 mg IV q.12.   SOCIAL HISTORY:  She lives in [place].  She has a 50 pack year smoking  history.  She has quit for 2 weeks.  She lives with her granddaughter.   FAMILY HISTORY:  Remarkable for a myocardial infarction in her father  who is deceased.   REVIEW OF SYSTEMS:  Other than some chills, dyspnea on exertion, cough  and some lower extremity all negative.  She has had no blood or melena  noted in her stool.   PHYSICAL EXAMINATION:  VITAL SIGNS:  Her blood pressure is 106/69.  Her  pulse is 85 and variable.  Temp is 98.7.  Respiratory rate is 20 and  unlabored.  She is in no acute distress.  Her O2 saturation is 95% on 2  liters  GENERAL:  She is hyperpigmented and looks chronically ill.  HEENT:  Normocephalic.  Otherwise, PERRLA.  Sclerae are injected.  Facial asymmetry is normal.  Dentition satisfactory.  Carotid upstrokes  are equal bilaterally with soft systolic sounds bilaterally.  There is  no JVD.  Thyroid is not enlarged.  Trachea is midline.  LUNGS:  Revealed decreased breath sounds throughout with decreased  breath sounds at the bases, left greater than right.  HEART:  Reveals a nondisplaced PMI.  She has a normal S1, S2 without  gallop.  Soft systolic murmur at the apex.  ABDOMINAL EXAM:  Soft with good bowel sounds.  No obvious organomegaly.  EXTREMITIES:  Reveal no edema.  Pulses are present.  NEURO EXAM:  Grossly intact.  SKIN:   Shows some ecchymosis and hyperpigmentation.   LABORATORY DATA:  Hemoglobin is 7.9, potassium 3.7, white count 7.3,  platelets 107,000, creatinine 1.1.  Cardiac enzymes negative.  TSH  normal.  INR 3.0.  Cholesterol is 73.  BNP 433.  Hemoccult negative.  Iron studies low, today negative.   Chest x-ray shows bilateral airspace disease, small bilateral pleural  effusions.  EKG shows atrial flutter.   ASSESSMENT:  Recalcitrant atrial flutter.  She is status post radio  frequency ablation attempt, as well as DC cardioversion.  Her amiodarone  was recently increased to 400 mg a day with persistent anticoagulation.  Plan is for outpatient cardioversion with Dr. Dannielle Burn when she is over  the acute illness and her anemia has been reconciled or worked up.   RECOMMENDATIONS:  1. Continue current medications.  2. Nutritional consult.  3. Continue amiodarone at 400 mg a day.  4. Continue Coumadin with INR of 2.0 to 3.0.  5. Workup and evaluation of anemia.  6. Once anemia evaluated would transfuse to a hemoglobin of 9.0 to      10.0.  7. We will arrange for follow up with Dr. Dannielle Burn as an outpatient for      elective cardioversion.   Thank you for much for the consultation.      Thomas C. Verl Blalock, MD, [facility]  Electronically Signed     TCW/MEDQ  D:  01/13/2007  T:  01/13/2007  Job:  [id number]   cc:   Ernestine Mcmurray, MD,FACC  Wynelle Fanny. Glori Bickers, MD

## 2010-12-23 NOTE — Assessment & Plan Note (Signed)
[facility] OFFICE NOTE   [name], [name]                    MRN:          [id number]  DATE:05/10/2007                            DOB:          Aug 16, 1934    REASON FOR VISIT:  Scheduled clinic followup.  Please refer to Dr. Jana Half DeGent's recent office note of April 07, 2007, for full details.   At that time, the patient presented with recurrent atrial flutter and  Dr. Dannielle Burn recommended a second attempt at a DC cardioversion.  He did,  however, suggest that if the DC cardioversion were not successful, that  we would refer her back to our EP team to consider repeat ablation, if  not referral also to a tertiary center; however, the patient  subsequently decided that she would prefer to proceed with ablation  rather than a second attempt at DC cardioversion.  Arrangements were  thus made for her to follow up with Dr. Champ Mungo. [name] on May 06, 2007; however, she was subsequently hospitalized back here at [facility] on April 28, 2007, with worsening shortness of breath.  She  was found to have diffuse bilateral infiltrates in  her lung, with  associated moderate hypoxemia, and was initially empirically treated for  right-sided pneumonia.  She was seen in consultation by Dr. Ramond Dial who was concerned that this represented an atypical  alveolitis, possibly related to amiodarone, atypical infection or an  idiopathic process.   The patient was discharged on May 02, 2007 and now presents in  followup, reporting significant improvement in her breathing.  She has  only occasional palpitations.  She has also since stopped smoking  tobacco.  A repeat electrocardiogram today shows reversion back to  atrial fibrillation at 81 b.p.m.  This contrasts with the previous  office electrocardiogram on April 07, 2007, revealing atypical atrial  flutter at approximately 80 b.p.m.  as well.   CURRENT MEDICATIONS:  1. Diltiazem 120 mg daily.  2. Amiodarone 400 mg daily.  3. Coumadin 2.5 mg as directed.  4. Aspirin 81 mg daily.  5. Accolate 20 mg twice daily.  6. Nexium 40 mg daily.  7. Lisinopril 40 mg daily.  8. Lipitor 20 mg q.h.s.  9. Metformin 500 mg twice daily.   PHYSICAL EXAMINATION:  VITAL SIGNS:  Blood pressure 133/81, pulse 87 and  irregular, saturation 97% on room air.  GENERAL:  A 76 year old female sitting upright in no distress.  HEENT:  Normocephalic and atraumatic.  NECK:  Palpable bilateral carotid pulses without bruits.  No jugular  venous distention at 90 degrees.  LUNGS:  Diminished breath sounds at the bases without crackles or  wheezes.  HEART:  Irregularly irregular (S1 and S2).  No significant murmurs.  ABDOMEN:  Benign.  EXTREMITIES:  Trace edema.  NEUROLOGIC:  No focal deficits.   IMPRESSION:  1. Recurrent atrial arrhythmias      a.     Currently demonstrating atrial fibrillation with controlled       ventricular response.      b.     History  of atypical atrial flutter, status post unsuccessful       radiofrequency ablation attempt in March 2008.      c.     Status post initial successful direct current cardioversion       with restoration of normal sinus rhythm, but subsequent reversion       back to atrial flutter, requiring amiodarone load.  2. Preserved left ventricular function.  3. Chronic Coumadin.  4. Recurrent pneumonitis      a.     Status post recent hospitalization here at [facility]  5. Valvular heart disease      a.     Moderate mitral regurgitation, mild aortic stenosis, mild       aortic insufficiency by 2-Dimensional echocardiogram, August 2007.  6. History of atypical chest pain      a.     Negative adenosine stress Cardiolite; EF 60%, April 2008.  7. History of upper gastrointestinal bleed      a.     Requiring multiple transfusions.  8. Chronic obstructive pulmonary disease/history of  tobacco      a.     Recently discontinued smoking.   PLAN:  1. Following review with Dr. Dannielle Burn, the recommendations are that the      patient will be rescheduled to follow up with Dr. Cristopher Peru in      the next several weeks, for further recommendations regarding      management of recurrent atypical atrial flutter and, now, atrial      fibrillation.  2. Discontinue amiodarone, given recent concern that this recurrent      pneumonitis may, in fact, be due to amiodarone toxicity.  3. Follow up two-view chest x-ray.  4. Check beta natriuretic peptide level.  5. Schedule a return clinic followup with myself and with Dr. Dannielle Burn      in three months.      Gene Serpe, PA-C  Electronically Signed      Ernestine Mcmurray, MD,FACC  Electronically Signed   GS/MedQ  DD: 05/10/2007  DT: 05/10/2007  Job #: [id number]   cc:   [name], N.P.

## 2010-12-24 NOTE — Telephone Encounter (Signed)
Left message to return call 

## 2010-12-26 NOTE — Assessment & Plan Note (Signed)
[facility] OFFICE NOTE   [name], [name]                    MRN:          [id number]  DATE:09/16/2006                            DOB:          06-30-1935    HISTORY OF PRESENT ILLNESS:  The patient is a 76 year old female,  recently seen by Dr. Stanford Breed in the [facility] Emergency Room with new  onset atrial fibrillation. It was felt that the patient had [place] score  of 2 and was started actually on Coumadin. The patient also had an  echocardiographic study done in the interim at Dr. Inda Castle office, the  results which we do not have available. The patient had a stress test 9  to 10 years ago. She was also placed on Cardizem. She has actually done  quite well since the office visit. However, she has not undergone any  screening for ischemia and no CardioNet monitor has been obtained yet.  The patient states that she has no chest pain, orthopnea, PND.   MEDICATIONS:  1. Accolate 220 mg p.o. b.i.d.  2. Nexium 40 mg a day.  3. Lisinopril 40 mg a day.  4. Cardizem 180 mg p.o. daily.  5. Actos 15 mg p.o. daily.  6. Combivent two puffs daily.  7. Simvastatin 80 mg half tablet q nightly.   PHYSICAL EXAMINATION:  VITAL SIGNS: Blood pressure is 100/58, heart rate  72, weight 160 pounds.  NECK: Normal carotid upstroke. No carotid bruits.  LUNGS:  Clear breath sounds bilaterally.  HEART: Regular rate and rhythm. Normal S1, S2. No murmur, rubs or  gallops.  ABDOMEN: Soft.  EXTREMITIES: No cyanosis, clubbing or edema.  NEURO: The patient is alert, oriented and grossly nonfocal.   PROBLEM LIST:  1. Paroxysmal atrial fibrillation.  2. Coumadin anticoagulation.  3. Diabetes mellitus.  4. Hypertension.  5. Dyslipidemia.  6. Chronic obstructive pulmonary disease.   PLAN:  1. The patient will followup with [place] in the next couple of weeks. I      have ordered a Cardiolite study as well as a CardioNet  monitor.  2. We changed Coumadin to 2.5 mg daily with followup PT, INR in 1 week      in [facility].  3. The patient was also given a prescription for Ambien 5 mg p.o. q      nightly for two weeks. I told her to further adjust this with Dr.      Rowe Pavy.  4. We will also obtain the echocardiographic study from Dr. Inda Castle      office as the mentions to me that she had some mildly leaky valves.     Ernestine Mcmurray, MD,FACC  Electronically Signed    GED/MedQ  DD: 09/20/2006  DT: 09/20/2006  Job #: [id number]   cc:   Wannetta Sender. Rice, M.D.

## 2010-12-26 NOTE — Op Note (Signed)
[name], [name] NO.:  [id number]   MEDICAL RECORD NO.:  [id number]          PATIENT TYPE:  INP   LOCATION:  2022                         FACILITY:  [facility]   PHYSICIAN:  Champ Mungo. [name], MD    DATE OF BIRTH:  23-Mar-1935   DATE OF PROCEDURE:  10/26/2006  DATE OF DISCHARGE:                               OPERATIVE REPORT   PROCEDURE PERFORMED:  Invasive electrophysiologic study and radio  frequency catheter ablation (attempted) of left atrial flutter utilizing  a transseptal catheterization.   INTRODUCTION:  The patient is 76 year old woman who is admitted to  hospital after developed symptomatic atrial flutter.  This was  associated with rapid ventricular response.  She is referred for  catheter ablation.  It was hoped that she could be off Coumadin as she  is not a particularly  good candidate for Coumadin for thromboembolic  prevention.   PROCEDURE:  After informed obtained, the patient is taken diagnostic EP  lab in fasting state.  After usual preparation and draping, a 6-French  hexapolar catheter was inserted percutaneously into the right jugular  vein and advanced to the coronary sinus.  A 7-French 20 pole halo  catheter was inserted percutaneously into the right femoral vein and  advanced to the right atrium.  A 5-French quadripolar catheter was  inserted percutaneously in the right femoral vein and advanced to the  His bundle region.  Mapping was subsequently carried out demonstrating  atypical left atrial flutter with a cycle length of 220 milliseconds.  This was evident by the earliest atrial activation to be in the distal  coronary sinus and activation in the right atrium to be breaking through  both high in the right atrium on the septum and low in the right atrium  near the coronary sinus os.  Pacing was subsequently carried out to  confirm the location and that it was a non isthmus dependent atrial  flutter by pacing from the right atrial  flutter isthmus.  This  demonstrated a very long post pacing interval (over 100 milliseconds)  greater than the atrial flutter cycle length.  With this information the  patient was prepped for transseptal catheterization.  The 8-French SL2  transseptal sheath and the transseptal catheter were advanced deep into  the right atrium.  After careful mapping of the right atrial septum was  carried out, transseptal catheterization was performed without  difficulty.  10 mL of contrast was injected into the left atrium and an  atriogram of the left atrium was carried out demonstrating and  confirming successful transseptal puncture without damage to any of the  adjacent structures.  The Tessa Lerner  SL2 catheter was then advanced into  the left atrium without difficulty.  Having accomplished this,  additional mapping was carried out with the 20 pole halo catheter being  advanced into the left atrium.  The halo catheter was manipulated both  on the posterior wall and on the septal wall of the right atrium as well  as being positioned on the lateral wall of the left atrium.  Interestingly enough, there was a very marked diminution of voltage on  the posterior portion of the left atrium as well as on the roof of the  left atrium.  There are areas of intact voltage along the floor of the  mitral valve annulus and along the pulmonary vein ostia (the antrum) on  the left side and there was some areas of voltage on the right atrium on  the right pulmonary vein side but overall there was a marked diminution  of voltage consistent with extensive scarring of the left atrium.  In  addition, it should be noted that after transseptal catheterization was  carried out, the patient's atrial activation sequence and atrial flutter  cycle length changed.  The new atrial flutter was somewhat variable with  cycle length between 175 and 185 milliseconds where his previous atrial  flutter was 220 milliseconds.  This  resulted in very difficult time  mapping as entrainment mapping could not be carried out both due to the  extensive scarring in the left atrium as well as to the rapid atrial  flutter cycle length.  Having done this then empiric RF energy  application was delivered between the left superior pulmonary vein with  the catheter being drug down to the mitral annulus in the lateral  position between 2 o'clock and 5 o'clock on the mitral valve annulus.  Care was taken not to enter and ablate inside the left atrial appendage.  Several RF energy applications were carried out on this region.  Next,  extensive mapping and ablation was carried out in the floor of the left  atrium adjacent to the mitral annulus.  Ablating in this location there  would be transient slowing of the atrial flutter isthmus, but the atrial  flutter would never terminate.  Mapping was then done on the right  superior pulmonary veins and RF energy application was extensively  delivered between the antrum of the right superior vein down to the  posterior septal portion of the left atrium but again there was no  change in the atrial flutter activation.  It should be noted that total  of 32 RF energy applications were delivered within the left atrium.  Should also be noted that heparin was given a immediately after  transseptal puncture with activated clotting time between 250 and 300  seconds.  Finely additional RF energy application was delivered in the  atrial flutter isthmus and along the posterior portion of the right  atrium.  However, there was no termination of the patient's atrial  flutter.  This point she was sedated and cardioverted.  Rapid atrial  pacing was then carried out down to 40 milliseconds where AV Wenckebach  was observed.  Programmed atrial stimulation was also carried out to  500, 400 down to 380 milliseconds where AV node ERP was observed.  Rapid ventricular pacing with the ablation catheter demonstrated  VA  dissociation.  At this point the catheters were removed.  Hemostasis was  assured, the patient was returned to her room in satisfactory condition.   COMPLICATIONS:  There were no immediate procedure complications.   RESULTS:  A.  Baseline ECG.  The baseline ECG demonstrates atrial  flutter (atypical) with a controlled ventricular response.  B.  Baseline intervals.  The  HV interval was 55 milliseconds.  The  atrial flutter cycle length was initially 220 milliseconds.  After  transseptal catheterization the new atrial flutter was 180 milliseconds.  C.  Rapid ventricular pacing.  Rapid ventricular pacing demonstrated VA  dissociation 600.  D.  Programmed ventricular simulation.  Programmed ventricular  stimulation demonstrated VA dissociation at 600.  E.  Programmed atrial stimulation.  Programmed atrial stimulation  carried out at base drive cycle length of 500 milliseconds demonstrated  AV node ERP of 380 milliseconds.  F.  Rapid atrial pacing.  Rapid atrial pacing demonstrated AV Wenckebach  at 400 milliseconds.  G.  Arrhythmias observed.  1. Left atrial flutter cycle length 220 milliseconds.  2. Left atrial flutter cycle length 180 milliseconds.  Method of      termination was with DC cardioversion.  Initiation was spontaneous.      a.     Mapping.  Mapping of atrial flutter demonstrates a left       atrial flutter.  Additional mapping demonstrated extensive       scarring in the left atrium as evidence of a very marked paucity       of atrial electrograms throughout the posterior and superior       portion of the left atrium.      b.     RF energy applications.  Total 32 RF energy applications       were delivered both into multiple regions in left atrium as       previously documented as well as in the right atrium along the       atrial flutter isthmus and the posterior wall near the crista.   CONCLUSION:  Study demonstrates unsuccessful left atrial flutter  ablation,  followed by successful DC cardioversion.  Because of the  patient's multiple left atrial flutters and because of the patient's  extensive scarring in the left atrium and because of the inability to  successfully terminate the patient's left atrial flutter with catheter  ablation, should be treated with amiodarone.      Champ Mungo. [name], MD  Electronically Signed    GWT/MEDQ  D:  10/27/2006  T:  10/27/2006  Job:  [id number]   cc:   Dr. Dian Queen

## 2010-12-26 NOTE — Consult Note (Signed)
[name], [name] NO.:  [id number]   MEDICAL RECORD NO.:  [id number]          PATIENT TYPE:  INP   LOCATION:  2022                         FACILITY:  [facility]   PHYSICIAN:  Champ Mungo. [name], MD    DATE OF BIRTH:  05/12/1935   DATE OF CONSULTATION:  10/25/2006  DATE OF DISCHARGE:                                 CONSULTATION   INDICATION FOR CONSULTATION:  Evaluation of atrial flutter.   HISTORY OF PRESENT ILLNESS:  The patient is a 76 year old woman with a  history of recurrent atrial arrhythmias that she describes as spells.  These began approximately 9 months ago.  They are intermittent.  She had  several episodes with hospitalizations and apparently she has had both  atrial flutter as well as atrial fibrillation.  She notes that when she  had these spells, she gets weak, fatigue and short of breath.  She has  had no frank syncope.  She was seen in our office in [place] and then  hospitalized and she was atrial flutter with rapid ventricular response.  She is now referred for additional evaluation.  The patient denies chest  pain.  She denies peripheral edema.   PAST MEDICAL HISTORY:  1. As noted above.  2. History of diabetes.  3. History of hypertension.  4. History of dyslipidemia.  5. History of COPD.  6. History of pneumonia.  7. Status post appendectomy and cholecystectomy in the past.  8. She is status post recurrent hernia repairs.   SOCIAL HISTORY:  The patient lives in [place] with her granddaughter.  She continues to smoke cigarettes.  She denies alcohol abuse.  She is  over a one pack per day smoker has been so all of her adult life.   FAMILY HISTORY:  Notable for her mother who died of cancer and father of  MI at age 62.   REVIEW OF SYSTEMS:  Notable for acid reflux symptoms.  Otherwise, as  noted in the HPI.  Otherwise, all symptoms reviewed and found to be  negative.   PHYSICAL EXAMINATION:  GENERAL:  She is a pleasant 76 year old woman  in  no distress.  She does have tardive dyskinesia.  VITAL SIGNS:  Blood pressure was 100/60, pulse 49 and regular,  respirations 18.  HEENT:  Normocephalic and atraumatic.  Pupils equal, round.  Oropharynx  is moist.  Sclerae anicteric.  NECK:  Revealed no jugular distension.  There is no thyromegaly.  Trachea was midline.  Carotid 2+ symmetric.  LUNGS:  Clear bilaterally auscultation.  No wheezes, rales or rhonchi  were present.  CARDIOVASCULAR:  Irregular rhythm with normal S1-S2.  EXTREMITIES:  Demonstrated no cyanosis, clubbing or edema.  Pulses were  2+ and symmetric.  NEUROLOGIC:  Alert and oriented x3.  Cranial nerves  intact.  Strength 5/5 and symmetric.   STUDIES:  EKG demonstrates atrial flutter with a controlled ventricular  response.  There is somewhat atypical appearance with this flutter.   IMPRESSION:  1. Atrial flutter.  2. Shortness of breath and palpitations secondary to #1.   DISCUSSION:  I discussed the risks, benefits, goals and expectations  of  the procedure with the patient and granddaughter in detail.  They wished  to proceed.      Champ Mungo. [name], MD  Electronically Signed     GWT/MEDQ  D:  10/26/2006  T:  10/26/2006  Job:  [id number]   cc:   Ernestine Mcmurray, MD,FACC  Mohammed Tracey Harries Hasanaj

## 2010-12-26 NOTE — Telephone Encounter (Signed)
Patient notified & verbalized understanding.  Is taken care of with PMD.

## 2010-12-26 NOTE — Assessment & Plan Note (Signed)
[facility] OFFICE NOTE   [name], [name]                    MRN:          [id number]  DATE:10/19/2006                            DOB:          August 26, 1934    HISTORY OF PRESENT ILLNESS:  The patient is a 76 year old female with a  history of paroxysmal atrial fibrillation.  We saw her in the office on  September 16, 2006.  CardioNet study was ordered.  Unfortunately this was  never done.  The patient called on September 25, 2006 with palpitations  and was sent to the emergency room at [facility].  She was actually  admitted with pneumonia at that time.  She is now back in normal sinus  rhythm.  Unfortunately, we still do not have the echocardiographic  report either from Dr. Inda Castle office.  The patient overall though has  been doing well.  She has been hemodynamically stable.  She reports no  orthopnea, PND or recurrent palpitations currently.   MEDICATIONS:  1. Accolate 20 mg p.o. b.i.d.  2. Nexium 40 mg daily.  3. Lisinopril 40 mg daily.  4. Cardizem CD 180 mg p.o. a day.  5. Actos 15 daily.  6. Combivent.  7. Simvastatin.  8. Coumadin as directed.   PHYSICAL EXAMINATION:  VITAL SIGNS:  Blood pressure 147/66, heart rate  53 beats per minute, weight is 160 pounds.  GENERAL:  Well-nourished female in no apparent distress.  NECK EXAM:  Normal carotid upstroke.  No carotid bruits.  LUNGS:  Clear.  HEART:  Regular rate and rhythm.  Normal S1 and S2.  ABDOMEN:  Soft.  EXTREMITY EXAM:  No edema.   PROBLEM:  1. Paroxysmal atrial fibrillation.  2. Coumadin anticoagulation.  3. Diabetes mellitus.  4. Hypertension.  5. Dyslipidemia.  6. Chronic obstructive pulmonary disease.  7. Status post recent pneumonia.   PLAN:  1. The patient will be rescheduled for a Cardiolite study and      CardioNet monitor.  2. The patient can continue on her current medical regimen.  3. We will still try to obtain the  echocardiographic study from Dr.      Inda Castle office.     Ernestine Mcmurray, MD,FACC  Electronically Signed    GED/MedQ  DD: 10/19/2006  DT: 10/21/2006  Job #: [id number]

## 2010-12-26 NOTE — Discharge Summary (Signed)
[name], [name] NO.:  [id number]   MEDICAL RECORD NO.:  [id number]          PATIENT TYPE:  INP   LOCATION:  2022                         FACILITY:  [facility]   PHYSICIAN:  Sueanne Margarita, PA   DATE OF BIRTH:  03/05/1935   DATE OF ADMISSION:  10/25/2006  DATE OF DISCHARGE:  10/28/2006                               DISCHARGE SUMMARY   ALLERGIES:  NO KNOWN DRUG ALLERGIES.   TIME OF DISCHARGE:  This discharge took greater than 45 minutes.   PRINCIPAL DIAGNOSES:  1. Discharging day #2 status post transseptal catheter approach to a      left atrial flutter      a.     Extensive mapping in the left atrium with finding of       extensive scar, 32 radiofrequency applications to the region       around the left superior vein at the mitral valve isthmus.      b.     Left atrial flutter was persistent.  The patient had       cardioversion to sinus rhythm and was placed on amiodarone       therapy.  2. Discharging day #2 status post the beginning of amiodarone therapy.  3. Symptomatic atrial flutter:  With palpitations/weakness.   SECONDARY DIAGNOSES:  1. Ongoing tobacco habituation.  Had smoking cessation consult here at      [facility] October 26, 2006.  2. hypertension.  3. COPD.  4. Dyslipidemia.  5. Type 2 diabetes.  6. History of recent pneumonia.   PROCEDURE:  As outlined above.  On October 26, 2006, transseptal catheter  approach to a left atrial flutter with delivery of radiofrequency  catheter ablations to the lateral left atrial wall, a left superior  pulmonary vein, the mitral valve annulus without change in atrial  flutter,  radiofrequency catheter ablation also delivered to the floor  of the left atrium without change in atrial flutter.  The patient then  was cardioverted with 20 joules returning to sinus rhythm.  The patient  was placed on amiodarone.   BRIEF HISTORY:  [name] is a 76 year old female.  She has  atrial  flutter.  The patient was seen in the office in consultation  February 2008.  The patient has been on Coumadin therapy with a CHADS  score of 2.  The patient had a recent hospitalization in February for  pneumonia, and electrocardiograms were taken at that time which  demonstrated atrial flutter.   Now, into the month of March, the patient is having complications of  palpitation.  She said that her heart rate goes from 75-126 with minimal  activity.  When her heart is beating quickly, she feels shaky and weak.  She denies syncope, chest pain or shortness of breath.  This rhythm did  not resolve the patient then called the office of [facility],  and it was felt that the patient would do well if she would go to the  emergency room.  Electrocardiograms taken at that visit, done March 11,  showed atrial flutter.  The patient was seen  on March 17 by Dr. Dannielle Burn  at [facility].  The patient remains in atrial flutter with  variable block.  She feels somewhat weakened.  She feels that her heart  accelerates at times and telemetry shows that the patient does have  heart rates which accelerated to 130 beats per minute.  The patient's  Cardizem which was 180 mg daily was increased to 240 mg daily.  The  patient has been on anticoagulation greater than 6 weeks, and she was a  good candidate for atrial flutter ablation with transfer to [place]:  The patient presented to [facility] March 17.  She was in atrial flutter.  This is symptomatic.  She feels palpitation,  weakness and fatigue.  She does not complain of syncope or chest pain.  Her Cardizem is now 240 mg daily, and she was seen in EP consult by Dr.  Cristopher Peru.  He has scheduled her to have an electrophysiology study  radiofrequency catheter ablation for March 18.  This was done on that  date, and is described above.  The atrial flutter was located  originating in the left atrium and  required a transseptal catheter  approach.  There were multiple atrial flutter foci and Dr. Caryl Comes  delivered multiple radiofrequency catheter ablations to various  anatomical positions in the left atrium as described above.  The atrial  flutter was not ablated as a result of any of these applications.  The  patient was cardioverted and started on amiodarone.  The patient has  been watched for the next 48 hours here, discharging March 20.  She has  maintained sinus rhythm.  She is tolerating amiodarone.  Her Coumadin  dose has been adjusted to account for the presence of amiodarone.  Her  Statin has been changed from Zocor to Lipitor, and she will be  discharged on a reduced dose of aspirin.   MEDICATIONS AT DISCHARGE:  1. Coumadin 1 mg daily or as directed by Dr. [facility].      This is a new dose.  2. Amiodarone 200 mg 2 tablets daily March 20 through Thursday, March      27, as a loading dose.  At that time, she will have received 8.0      grams and she is to take one tablet daily beginning Friday, March      28.  3. Enteric-coated aspirin 81 mg daily.  This is a new dose.  4. Nexium 40 mg daily.  5. Lisinopril 40 mg daily.  6. Combivent metered-dose inhaler one puff twice daily  7. Diltiazem 240 mg daily.  This is a new dose.  8. Actos 50 mg daily.  9. Lipitor 20 mg daily at bedtime.  This is also a new medication that      replaces simvastatin.  10.Accolate 20 mg twice daily.   FOLLOWUP:  She has follow-up at [facility] office.  1. Coumadin Clinic Monday March 24 at 9:00.  2. She will see Richardson Dopp, Physician Assistant, Friday April 4 at      8:30.   LABORATORY DATA:  The pro time on the day of discharge is 29.4.  The INR  2.6.  Other laboratory studies pertinent to this admission are her  admission CBC:  Hemoglobin 12.8, hematocrit 38.2, white cells 7.1,  platelets 185.  Serum electrolytes:  Sodium 134, potassium 3.8, chloride 101,  carbonate  26, BUN is 17, creatinine 1.05,  glucose is 105.  Alkaline  phosphatase this admission 84, SGOT 19, SGPT is 15.   TIME OF DISCHARGE:  this is a greater than 35-minute discharge.      Sueanne Margarita, PA     GM/MEDQ  D:  10/28/2006  T:  10/28/2006  Job:  [id number]   cc:   Champ Mungo. [name], MD  Ernestine Mcmurray, [place]  Xaje Hasanaj

## 2010-12-26 NOTE — Assessment & Plan Note (Signed)
[place] OFFICE NOTE   KALLIYAN, GUNNETT                    MRN:          [id number]  DATE:11/12/2006                            DOB:          [birth year]    CARDIOLOGIST:  Dr. Terald Sleeper.   PRIMARY CARE PHYSICIAN:  Dr. Benjamine Mola.   HISTORY OF PRESENT ILLNESS:  [name] is a 76 year old female  patient who had previously been followed by Dr. Dannielle Burn with a history of  paroxysmal atrial fibrillation on Coumadin anticoagulation.  She was set  up for a CardioNet monitor as well as Cardiolite study for risk  stratification.  Prior to her getting her monitor and her nuclear study,  she developed recurrent palpitations and went to the emergency room.  She was found to be in atrial flutter and was transferred to [facility] for further evaluation and treatment.  She was seen by the EP  service.  Dr. Caryl Comes attempted radiofrequency catheter ablation of her  atrial flutter.  This was unsuccessful.  She was placed on amiodarone  and cardioverted to sinus rhythm.  She returns to the office today for  followup.  Overall, she is doing well.  She had some chest pain  yesterday.  This was left-sided and then she describes it as a prickly  sensation.  There was no associated shortness of breath, nausea,  diaphoresis, syncope, or near syncope.  She had no radiating symptoms.  She denies any exertional symptoms.  She does note that with activity,  she continues to have some tachy palpitations.  Otherwise she feels  well.  She denies any worsening fatigue.   CURRENT MEDICATIONS:  1. Accolate 20 mg b.i.d.  2. Nexium 40 mg daily.  3. Lisinopril 40 mg daily.  4. Actos 15 mg daily.  5. Coumadin as directed, she takes 1 mg daily except for 2.5 mg on      Friday and Sunday.  6. Amiodarone 200 mg daily.  7. Aspirin 81 mg daily.  8. Combivent b.i.d.  9. Lipitor 20 mg nightly.  10.Diltiazem 240 mg daily.  11.Nitroglycerin p.r.n. chest pain.   ALLERGIES:  NO KNOWN DRUG ALLERGIES.   PHYSICAL EXAMINATION:  She is a well-nourished, well-developed female in  no acute distress.  Blood pressure 120/76, pulse 93, weight 155.4  pounds.  HEENT:  Unremarkable.  NECK:  Without JVD.  CARDIAC:  Normal S1, S2, irregularly irregular rhythm.  LUNGS:  Clear to auscultation bilaterally without wheezing, rhonchi, or  rales.  ABDOMEN:  Soft, nontender with normoactive bowel sounds, no  organomegaly.  EXTREMITIES:  Without edema; calves are soft, nontender.  Right groin  site without hematoma or bruit.   Electrocardiogram reveals probable atypical atrial flutter with variable  AV block.   IMPRESSION:  1. Recurrent atrial flutter.      a.     Status post unsuccessful attempt at radiofrequency catheter       ablation of left atrial flutter.      b.     Amiodarone therapy.      c.     Coumadin therapy.  2.  Hypertension, controlled.  3. Dyslipidemia, treated.  4. Diabetes mellitus type 2.  5. Chronic obstructive pulmonary disease.  6. Ex-smoker (she quit 2 weeks ago!).  7. Atypical chest pain.   PLAN:  The patient presents to the office today for post hospitalization  followup.  She is back in what appears to be atrial flutter.  I have  obtained electrocardiograms from her hospitalization when she was out of  rhythm, and the EKG today looks very similar to what she had in the  past.  It appears as though she has a variable AV block.  Overall, she  is not very symptomatic with this.  Her ventricular rate is 99.  I  suspect that most of the time she is in the low 100s.  She does have her  CardioNet monitor on now.  Over time that will help [place] decipher how well  her rate is controlled.  Her INR today was 1.7.  I discussed the  situation with Dr. Ron Parker over the telephone.  At this point in time, we  have elected to go up on her amiodarone from 200 to 400 mg a day.  She  only took this 400 mg dose for 7  days before she decreased.  This may  help control her rate some.  We have decided to leave her Coumadin dose  the same at this point in time as her INR is increasing and we are going  to change her amiodarone dose.   I am not sure what to make of her chest pain.  It is very atypical.  She  says that she took nitroglycerin yesterday.  The pain went away, but  this was 20 minutes after taking nitroglycerin.  She is in need of risk  stratification and has actually missed a couple of nuclear studies over  the last couple months.  We will go ahead and set her up for another  adenosine Myoview.  She does have a history of COPD, but she is not  actively wheezing, and I think she should be able to  tolerate that just fine.  I will bring her back in the next week to week  and a half to follow up on her rate and to review her stress test.      Richardson Dopp, PA-C  Electronically Signed      Carlena Bjornstad, MD, [facility]  Electronically Signed   SW/MedQ  DD: 11/12/2006  DT: 11/12/2006  Job #: [id number]   cc:   Wannetta Sender. Rice, M.D.

## 2010-12-26 NOTE — Assessment & Plan Note (Signed)
[place]                                 ON-CALL NOTE   SHADON, BARCENA                    MRN:          [id number]  DATE:10/24/2006                            DOB:          08-Oct-1934    TELEPHONE NUMBER:  [id number]   HISTORY:  Ms. Rubenzer is a 76 year old female patient with a history  of paroxysmal atrial fibrillation on Coumadin therapy who called the  answering service tonight with complaints of palpitations. She says that  her heart rate has gone from 75 to 126 with just minimal activity. She  feels shaky and weak with this. She denies any near syncope, chest pain,  or shortness of breath. This all started last evening and has not  resolved to this point.   PLAN:  The patient is calling with symptoms that sound consistent with  paroxysmal atrial fibrillation. She is mildly symptomatic with this. She  has not yet had her CardioNet monitor placed. This was recommended by  Dr. Dannielle Burn on March 11th at a routine office visit. I have recommended  that she go to the emergency room at [facility] for an EKG and  further evaluation by the ER physician. I explained to the patient that  she may simply just need adjustment of her medications, but I told her  that this is hard to assess over the telephone.   DISPOSITION:  She agrees to go to the ED at [facility].     Richardson Dopp, PA-C  Electronically Signed    SW/MedQ  DD: 10/24/2006  DT: 10/25/2006  Job #: [id number]

## 2010-12-26 NOTE — Assessment & Plan Note (Signed)
[name] OFFICE NOTE   [name]                    MRN:          [id number]  DATE:11/25/2006                            DOB:          Feb 20, 1935    CARDIOLOGIST:  Dr. Terald Sleeper.   PRIMARY CARE PHYSICIAN:  Dr. Wannetta Sender. [name].   HISTORY OF PRESENT ILLNESS:  [name] is a 76 year old female  patient with a history of paroxysmal atrial fibrillation/flutter who I  last saw on November 12, 2006. Please see that note for complete details. We  set her up for an adenosine Myoview study to rule out ischemia as she  has had some atypical chest pains. This returned negative for ischemia.  Her wall motion and thickening were all normal. Her EF was 60%. I had  also seen her intermittently in the Coumadin clinic for some left lower  extremity swelling. She had venous Dopplers and these were negative for  DVT. I suspect she probably has venous insufficiency. When I saw her in  the office on April 4, she was back in atrial flutter. Her rate was  about 99. I discussed it with Dr. Ron Parker and we decided to go back up on  her amiodarone to 400 mg a day. When she left the hospital on March 20,  she had been placed on 400 mg for just 7 days and then on 200 mg a day.  So therefore we upped her dose to see if we could get her back into  sinus rhythm. When she was discharged from the hospital in March, she  was in sinus rhythm after cardioversion. She returns today for followup.  She denies palpitations, chest pain or shortness of breath. She denies  syncope, or near syncope. She is still wearing her monitor and actually  completes it in the next day or two. Her INRs have been somewhat  difficult to get into range. It is again 1.9 today.   CURRENT MEDICATIONS:  1. Accolate 20 mg twice daily.  2. Nexium 40 mg daily.  3. Lisinopril 40 mg daily.  4. Actos 15 mg daily.  5. Coumadin as directed.  6. Aspirin 81 mg a  day.  7. Combivent.  8. Lipitor 20 mg q.h.s.  9. Diltiazem 240 mg daily.  10.Amiodarone 400 mg daily.  11.Nitroglycerin p.r.n. chest pain.   ALLERGIES:  No known drug allergies.   PHYSICAL EXAMINATION:  GENERAL:  She is a well-nourished, well-developed  female in no acute distress.  VITAL SIGNS:  Blood pressure is 125/75, pulse 84, weight 165 pounds.  HEENT:  Unremarkable.  NECK:  Without JVD.  CARDIAC:  S1, S2. Irregular irregular rhythm.  LUNGS:  Decreased breath sounds bilaterally, no wheezing, or rales.  ABDOMEN:  Soft, nontender.  EXTREMITIES:  1+ edema bilaterally with chronic stasis changes.   Electrocardiogram  reveals what looks to be atrial fibrillation with a  rate of 81. There is a suggestion of some flutter waves so this possibly  could be atrial flutter.   IMPRESSION:  1. Persistent atrial fibrillation/flutter.      a.  Status post unsuccessful attempt at radiofrequency catheter       ablation of left atrial flutter March 2008 by Dr. Caryl Comes.  2. Amiodarone therapy.  3. Coumadin therapy.  4. Hypertension.  5. Dyslipidemia.  6. Diabetes mellitus type 2.  7. Chronic obstructive pulmonary disease.  8. Ex-smoker.  9. Atypical chest pain.      a.     Recent nonischemic adenosine Myoview.  10.Peripheral edema.      a.     Likely venous insufficiency.  11.Mild aortic stenosis and mild aortic insufficiency by      echocardiogram August 2007.  12.Mild to moderate mitral regurgitation by echocardiogram August      2007.   PLAN:  The patient presents back to the office today for followup. She  had a negative Myoview study. She does however continue to remain in  persistent atrial fibrillation/flutter. She has a controlled ventricular  rate. She is fairly asymptomatic with this. I did review this with Dr.  Domenic Polite today. At this point in time will continue her current  medications the same. Will try to get her INRs to a therapeutic range. I  think it is worthwhile  to try to continue to load her with amiodarone  and get her INRs within range for a total of 3-4 weeks and then make a  second attempt at cardioversion. If she remains in sinus rhythm with  this then will go ahead and proceed with survey labs for her amiodarone  therapy (TSH and LFTs). Otherwise if she reverts back to atrial  fibrillation/flutter then there is really no reason to continue her on  amiodarone. I will  bring her back in about 4 weeks' time to see Dr. Dannielle Burn to make final  plans prior to possible cardioversion.      Richardson Dopp, PA-C  Electronically Signed      Satira Sark, MD  Electronically Signed   SW/MedQ  DD: 11/25/2006  DT: 11/25/2006  Job #: [id number]   cc:   [name], M.D.

## 2010-12-26 NOTE — Telephone Encounter (Signed)
error 

## 2010-12-26 NOTE — Consult Note (Signed)
[name], FOSSEY NO.:  [id number]   MEDICAL RECORD NO.:  [id number]          PATIENT TYPE:  EMS   LOCATION:  [facility]                         FACILITY:  [place]   PHYSICIAN:  Denice Bors. Stanford Breed, MD, FACCDATE OF BIRTH:  1934/08/12   DATE OF CONSULTATION:  09/13/2006  DATE OF DISCHARGE:                                 CONSULTATION   Ms. Hansman is a 76 year old female with past medical history of  paroxysmal atrial fibrillation, hypertension, diabetes mellitus,  hyperlipidemia, and COPD who we are asked to evaluate for recurrent  atrial fibrillation.   IDENTIFICATION:  The patient apparently has had atrial fibrillation  intermittently since July by her report.  She has being followed in [facility]  by Dr. Rowe Pavy.  She apparently had an echocardiogram, but I do not have  those results available.  She did state that she had two leaking  valves.  Note, she typically does not have dyspnea on exertion,  orthopnea, PND, pedal edema or exertional chest pain.  She developed  palpitations this morning associated with weakness, but there has been  no chest pain or shortness of breath.  They persisted, and she therefore  presented to the emergency room for further evaluation.  Note, her most  recent prior episode was 2 weeks ago.   Her medications at home include:  1. Actos 15 mg daily.  2. Combivent.  3. Lisinopril 40 mg p.o. daily.  4. Accolate.  5. Zocor 40 mg p.o. every day.  6. Nexium 40 mg p.o. b.i.d.  7. Cardizem 120 mg p.o. daily.   Her past medical history is significant for hypertension, diabetes  mellitus and hyperlipidemia.  She also has a history of COPD.  She has a  history of atrial fibrillation as described in the HPI.  She has had a  prior cholecystectomy and appendectomy.  She has also had hernias  repaired.  She has a history of reflux.   Her family history is significant for coronary disease in her father who  had a myocardial infarction.   REVIEW OF  SYSTEMS:  She denies any headaches or fevers or chills.  There  is no productive cough or hemoptysis.  There is no dysphagia,  odynophagia, melena or hematochezia.  There is no dysuria or hematuria.  There is no rash or seizure activity.  There is no orthopnea, PND or  pedal edema.  The remaining systems are negative.   Her physical exam today shows a blood pressure of 133/75 and pulse is  101.  She is afebrile.  She is well developed and somewhat frail.  She is no  acute distress.  Her skin is warm and dry.  She does not appear depressed.  Her back is normal.  Her HEENT:  She is edentulous, but her eyelids are normal.  Her neck is supple with a normal upstroke bilaterally, cannot appreciate  bruits.  There is no jugular distension and no thyromegaly is noted.  CHEST:  Clear to auscultation, normal expansion.  Her cardiovascular exam reveals an irregular rhythm.  There is a soft  1/6 systolic ejection murmur at the left sternal  border.  There is no  diastolic murmur noted.  ABDOMINAL EXAM:  Nontender, nondistended.  Positive bowel sounds.  No  hepatosplenomegaly.  No mass appreciated.  There is no abdominal bruit.  She has 2+ femoral pulses bilaterally and no bruits.  EXTREMITIES:  Show varicosities and trace edema, but there are no cords  palpated.  Her distal pulses are diminished.  Her neurological exam is grossly intact.  Her electrocardiogram today  shows atrial flutter with a rate of 90.  There is a prior septal infarct  and nonspecific ST changes are noted.  Her chest x-ray shows no acute  disease.  Her white blood cell count is 8.2 with a hemoglobin of 11.8  and hematocrit 33.8.  Her MCV is 94.9.  Platelet count is 154.  Her  potassium is 3.7.  Her BUN is 26 with a creatinine of 1.0.  Her INR is  1.1.  Her initial enzymes are negative.   DIAGNOSES:  1. Paroxysmal atrial fibrillation.  2. Diabetes mellitus.  3. Hypertension.  4. Hyperlipidemia.  5. Chronic obstructive  pulmonary disease.   PLAN:  Ms. Ichikawa presents with atrial fibrillation that has  apparently been intermittent since July.  Her rate is at the upper  limits of normal.  We will increase her Cardizem to 180 mg p.o. daily  for improved rated control.  She has embolic risk factors of diabetes  mellitus and hypertension which would place her CHADS score at 2.  I  would therefore recommend beginning Coumadin with goal INR of 2 to 3.  We will begin with 5 mg daily for 3 days and then alternate 5 mg with  2.5 mg.  We will check a PT in [place] on Thursday, and she will follow-up  with Dr. Dannielle Burn at that time.  We will obtain the echo results with Dr.  Inda Castle office.  We will also try to obtain all previous  electrocardiograms where there was a question of atrial fibrillation.  If it is indeed all flutter, then she can be referred to one of our  electrophysiologists for consideration of ablation.  She will need an  outpatient Myoview for risk stratification.  Also of note if she does  not convert, then we can proceed with cardioversion 3 weeks after her  INR is therapeutic.  She will need a TSH checked as an outpatient as  well.      Denice Bors Stanford Breed, MD, [facility]  Electronically Signed     BSC/MEDQ  D:  09/13/2006  T:  09/14/2006  Job:  [id number]

## 2010-12-26 NOTE — Assessment & Plan Note (Signed)
[place]                                 ON-CALL NOTE   [name], [name]                    MRN:          [id number]  DATE:09/25/2006                            DOB:          04/18/1935    Patient of Dr. Dannielle Burn.  Phone number [phone].   She called through the answering service complaining of heart  palpitations.  I returned the call at the above number.  She states she  has a history of atrial fibrillation, and is being treated by Dr. Dannielle Burn  for that diagnosis.  Today, her palpitations seemed to be worse, and she  feels like her heart is beating too fast.  She denies any chest  discomfort.  She complains of mild shortness of breath, mild  lightheadedness and dizziness.  She [place] near the [place] area.  I  instructed her that she needed to go the emergency room to be evaluated  and have an EKG done.  She stated she would rather to go [place] since  it was most convenient for her.  I stated that would be fine.  Patient  has family with her, and she states they are going to drive her there.     Rosanne Sack, ACNP  Electronically Signed    MB/MedQ  DD: 09/25/2006  DT: 09/25/2006  Job #: [id number]

## 2011-01-08 ENCOUNTER — Telehealth: Payer: Self-pay | Admitting: *Deleted

## 2011-01-08 NOTE — Telephone Encounter (Signed)
Please advise 

## 2011-01-08 NOTE — Telephone Encounter (Signed)
We can refill clonazepam 0.5 mg by mouth twice a day x30 days future refills for her primary care physician

## 2011-01-12 MED ORDER — CLONAZEPAM 0.5 MG PO TABS: 0.5000 mg | ORAL_TABLET | Freq: Two times a day (BID) | ORAL | Status: DC | PRN

## 2011-01-12 NOTE — Telephone Encounter (Signed)
Patient informed and rx called to [facility].

## 2011-02-06 ENCOUNTER — Encounter: Payer: Self-pay | Admitting: Internal Medicine

## 2011-02-06 ENCOUNTER — Ambulatory Visit (INDEPENDENT_AMBULATORY_CARE_PROVIDER_SITE_OTHER): Payer: Medicare Other | Admitting: Internal Medicine

## 2011-02-06 DIAGNOSIS — I495 Sick sinus syndrome: Secondary | ICD-10-CM

## 2011-02-06 DIAGNOSIS — I4891 Unspecified atrial fibrillation: Secondary | ICD-10-CM

## 2011-02-06 DIAGNOSIS — I10 Essential (primary) hypertension: Secondary | ICD-10-CM

## 2011-02-06 DIAGNOSIS — Z950 Presence of cardiac pacemaker: Secondary | ICD-10-CM

## 2011-02-06 LAB — PACEMAKER DEVICE OBSERVATION
BATTERY VOLTAGE: 3.008 V
BMOD-0002RV: 8
BRDY-0005RV: 50 {beats}/min
DEVICE MODEL PM: 7199163
RV LEAD IMPEDENCE PM: 850 Ohm
VENTRICULAR PACING PM: 42

## 2011-02-06 MED ORDER — DILTIAZEM HCL ER COATED BEADS 240 MG PO CP24: 240.0000 mg | ORAL_CAPSULE | Freq: Every day | ORAL | Status: DC

## 2011-02-06 NOTE — Assessment & Plan Note (Signed)
As above Increase cardizem Continue coumadin longterm

## 2011-02-06 NOTE — Assessment & Plan Note (Signed)
[name], [name]  I will therefore increase cardizem to 240mg  daily at this time.  Normal pacemaker function [name]

## 2011-02-06 NOTE — Progress Notes (Signed)
The patient presents today for routine electrophysiology followup.  Since having her pacemaker implanted by Dr Caryl Comes, the patient reports doing very well.  She reports occasional palpitations with moderate activity.  This has improved slightly with cardizem CD 180mg  daily as prescribed by Dr Dannielle Burn.  Today, she denies symptoms of  chest pain, shortness of breath, orthopnea, PND, lower extremity edema, dizziness, presyncope, syncope, or neurologic sequela.  The patient feels that she is tolerating medications without difficulties and is otherwise without complaint today.   Past Medical History  Diagnosis Date  . Permanent atrial fibrillation     with tachybrady.  [name] CAD (coronary artery disease) 2011      CARDIAC CATHETERIZATION   . Palpitations   . Chronic airway obstruction, not elsewhere classified   . Type II or unspecified type diabetes mellitus without mention of complication, not stated as uncontrolled   . Anemia, unspecified   . Chronic kidney disease, stage II (mild)   . Unspecified hypertensive kidney disease with chronic kidney disease stage I through stage IV, or unspecified   . Tachycardia-bradycardia     s/p PPM by Dr Caryl Comes  . GERD (gastroesophageal reflux disease)   . Undiagnosed cardiac murmurs   . Congestive heart failure, unspecified     EF now 60%   Past Surgical History  Procedure Date  . Single-chamber pacemaker implantation 3/12    by Dr Caryl Comes  . Evacuation of retroperitoneal and left rectus sheath     Current Outpatient Prescriptions  Medication Sig Dispense Refill  . clonazePAM (KLONOPIN) 0.5 MG tablet Take 1 tablet (0.5 mg total) by mouth 2 (two) times daily as needed for anxiety. Future refills are to come from PCP  30 tablet  0  . digoxin (LANOXIN) 0.125 MG tablet Take 125 mcg by mouth daily.        . Fluticasone-Salmeterol (ADVAIR DISKUS) 250-50 MCG/DOSE AEPB Inhale 1 puff into the lungs every 12 (twelve) hours.        . furosemide (LASIX) 40 MG tablet Take  40 mg by mouth daily.        [name] LORazepam (ATIVAN) 0.5 MG tablet Take 0.5 mg by mouth every 6 (six) hours as needed.        . metFORMIN (GLUCOPHAGE) 500 MG tablet Take 500 mg by mouth 2 (two) times daily with a meal.        . metoprolol tartrate (LOPRESSOR) 25 MG tablet Take 25 mg by mouth 3 (three) times daily.        [name] omeprazole (PRILOSEC) 40 MG capsule Take 40 mg by mouth daily.        . simvastatin (ZOCOR) 10 MG tablet Take 10 mg by mouth at bedtime.        [name] tiotropium (SPIRIVA) 18 MCG inhalation capsule Place 18 mcg into inhaler and inhale daily.        [name] warfarin (COUMADIN) 2 MG tablet Take 2 mg by mouth daily.        . zafirlukast (ACCOLATE) 20 MG tablet Take 20 mg by mouth 2 (two) times daily.        [name] DISCONTD: diltiazem (CARDIZEM CD) 180 MG 24 hr capsule Take 1 capsule (180 mg total) by mouth every morning.  30 capsule  6  . diltiazem (CARTIA XT) 240 MG 24 hr capsule Take 1 capsule (240 mg total) by mouth daily.  30 capsule  6  . DISCONTD: ALPRAZolam (XANAX) 0.25 MG tablet May take one tab every 6 hours as  needed for palpitations / anxiety  30 tablet  0  . DISCONTD: FeFum-FePoly-FA-B Cmp-C-Biot (INTEGRA PLUS) CAPS Take 1 capsule by mouth daily.        [name] DISCONTD: HYDROcodone-acetaminophen (VICODIN) 5-500 MG per tablet Take 1 tablet by mouth every 6 (six) hours as needed for pain.  30 tablet  1    Allergies  Allergen Reactions  . Tramadol Nausea Only    History   Social History  . Marital Status: Single    Spouse Name: N/A    Number of Children: 2  . Years of Education: N/A   Occupational History  . RETIRED    Social History Main Topics  . Smoking status: Former Smoker -- 2.0 packs/day for 45 years    Types: Cigarettes    Quit date: 08/10/2006  . Smokeless tobacco: Never Used  . Alcohol Use: No  . Drug Use: No  . Sexually Active: Not on file   Other Topics Concern  . Not on file   Social History Narrative  . No narrative on file    Family History  Problem  Relation Age of Onset  . Cancer Mother     Uterus  . Cancer Daughter     kidney cancer, in remission  . Heart disease Father     ROS-  All systems are reviewed and are negative except as outlined in the HPI above    Physical Exam: Filed Vitals:   02/06/11 1525  BP: 136/68  Pulse: 64  Height: 5\' 3"  (1.6 m)  Weight: 170 lb (77.111 kg)    GEN- The patient is elderly appearing, alert and oriented x 3 today.   Head- normocephalic, atraumatic Eyes-  Sclera clear, conjunctiva pink Ears- hearing intact Oropharynx- clear Neck- supple, no JVP Lymph- no cervical lymphadenopathy Lungs- Clear to ausculation bilaterally, normal work of breathing Chest- pacemaker pocket is well healed Heart- Regular rate and rhythm, no murmurs, rubs or gallops, PMI not laterally displaced GI- soft, NT, ND, + BS Extremities- no clubbing, cyanosis, trace edema MS- no significant deformity or atrophy Skin- no rash or lesion Psych- euthymic mood, full affect Neuro- strength and sensation are intact  Pacemaker interrogation- reviewed in detail today,  See PACEART report  Assessment and Plan:

## 2011-02-06 NOTE — Assessment & Plan Note (Signed)
Above goal Increase cardizem Salt restriction

## 2011-02-06 NOTE — Patient Instructions (Signed)
   Follow up in March.  Increase Cardizem (diltiazem) to 240 mg daily. Call when refills needed.

## 2011-02-15 DIAGNOSIS — R079 Chest pain, unspecified: Secondary | ICD-10-CM

## 2011-02-22 ENCOUNTER — Emergency Department (HOSPITAL_COMMUNITY)
Admission: EM | Admit: 2011-02-22 | Discharge: 2011-02-22 | Disposition: A | Discharge status: Home / Self Care | Payer: Medicare Other | Attending: Emergency Medicine | Admitting: Emergency Medicine

## 2011-02-22 ENCOUNTER — Emergency Department (HOSPITAL_COMMUNITY): Payer: Medicare Other

## 2011-02-22 DIAGNOSIS — K838 Other specified diseases of biliary tract: Secondary | ICD-10-CM | POA: Insufficient documentation

## 2011-02-22 DIAGNOSIS — J449 Chronic obstructive pulmonary disease, unspecified: Secondary | ICD-10-CM | POA: Insufficient documentation

## 2011-02-22 DIAGNOSIS — K5900 Constipation, unspecified: Secondary | ICD-10-CM | POA: Insufficient documentation

## 2011-02-22 DIAGNOSIS — R110 Nausea: Secondary | ICD-10-CM | POA: Insufficient documentation

## 2011-02-22 DIAGNOSIS — J4489 Other specified chronic obstructive pulmonary disease: Secondary | ICD-10-CM | POA: Insufficient documentation

## 2011-02-22 DIAGNOSIS — E119 Type 2 diabetes mellitus without complications: Secondary | ICD-10-CM | POA: Insufficient documentation

## 2011-02-22 DIAGNOSIS — R109 Unspecified abdominal pain: Secondary | ICD-10-CM | POA: Insufficient documentation

## 2011-02-22 DIAGNOSIS — R011 Cardiac murmur, unspecified: Secondary | ICD-10-CM | POA: Insufficient documentation

## 2011-02-22 DIAGNOSIS — R6883 Chills (without fever): Secondary | ICD-10-CM | POA: Insufficient documentation

## 2011-02-22 DIAGNOSIS — I509 Heart failure, unspecified: Secondary | ICD-10-CM | POA: Insufficient documentation

## 2011-02-22 DIAGNOSIS — I4891 Unspecified atrial fibrillation: Secondary | ICD-10-CM | POA: Insufficient documentation

## 2011-02-22 DIAGNOSIS — K5730 Diverticulosis of large intestine without perforation or abscess without bleeding: Secondary | ICD-10-CM | POA: Insufficient documentation

## 2011-02-22 DIAGNOSIS — I10 Essential (primary) hypertension: Secondary | ICD-10-CM | POA: Insufficient documentation

## 2011-02-22 LAB — BASIC METABOLIC PANEL
BUN: 13 mg/dL (ref 6–23)
Chloride: 100 mEq/L (ref 96–112)
Glucose, Bld: 101 mg/dL — ABNORMAL HIGH (ref 70–99)
Potassium: 3.7 mEq/L (ref 3.5–5.1)

## 2011-02-22 LAB — CBC
HCT: 35.9 % — ABNORMAL LOW (ref 36.0–46.0)
Hemoglobin: 12.3 g/dL (ref 12.0–15.0)
MCH: 33.4 pg (ref 26.0–34.0)
MCV: 97.6 fL (ref 78.0–100.0)
RBC: 3.68 MIL/uL — ABNORMAL LOW (ref 3.87–5.11)

## 2011-02-22 LAB — DIFFERENTIAL
Lymphs Abs: 1.9 10*3/uL (ref 0.7–4.0)
Monocytes Relative: 8 % (ref 3–12)
Neutro Abs: 6.1 10*3/uL (ref 1.7–7.7)
Neutrophils Relative %: 69 % (ref 43–77)

## 2011-02-22 LAB — URINALYSIS, ROUTINE W REFLEX MICROSCOPIC
Bilirubin Urine: NEGATIVE
Hgb urine dipstick: NEGATIVE
Ketones, ur: NEGATIVE mg/dL
Nitrite: NEGATIVE
Urobilinogen, UA: 0.2 mg/dL (ref 0.0–1.0)

## 2011-02-22 MED ORDER — IOHEXOL 300 MG/ML  SOLN: 80.0000 mL | Freq: Once | INTRAMUSCULAR | Status: DC | PRN

## 2011-03-04 ENCOUNTER — Ambulatory Visit: Payer: Medicare Other | Admitting: Cardiology

## 2011-03-04 ENCOUNTER — Encounter: Payer: Self-pay | Admitting: Cardiology

## 2011-03-04 DIAGNOSIS — I495 Sick sinus syndrome: Secondary | ICD-10-CM | POA: Insufficient documentation

## 2011-03-04 DIAGNOSIS — R9389 Abnormal findings on diagnostic imaging of other specified body structures: Secondary | ICD-10-CM | POA: Insufficient documentation

## 2011-03-04 DIAGNOSIS — I2510 Atherosclerotic heart disease of native coronary artery without angina pectoris: Secondary | ICD-10-CM | POA: Insufficient documentation

## 2011-03-04 DIAGNOSIS — I4891 Unspecified atrial fibrillation: Secondary | ICD-10-CM | POA: Insufficient documentation

## 2011-03-04 DIAGNOSIS — I4821 Permanent atrial fibrillation: Secondary | ICD-10-CM

## 2011-03-04 DIAGNOSIS — Z950 Presence of cardiac pacemaker: Secondary | ICD-10-CM | POA: Insufficient documentation

## 2011-03-04 DIAGNOSIS — I050 Rheumatic mitral stenosis: Secondary | ICD-10-CM | POA: Insufficient documentation

## 2011-04-19 ENCOUNTER — Telehealth: Payer: Self-pay | Admitting: Physician Assistant

## 2011-04-19 NOTE — Telephone Encounter (Signed)
[name] is a 76 y.o. female called in with chest pain.  She went to [facility] last week with chest pain.  Apparently r/o and was sent home.  States she never saw cardiology. Notes a h/o palpitations as well. Has continued to have chest pain off and on over the last week.  Not necessarily related to exertion.  Occurs at rest and sometimes wakes her up. Feels SOB with it.  Also notes nausea.   Notes nonproductive cough.  No indigestion.  No fevers. States pain today worse than it has been.  Nothing makes it better.  May last 30-40 minutes. Also notes her HR increases at times.  I have advised her to go to the ED for evaluation of her chest pain.  She is by herself at home and will dial 911. She agrees to call 911. Richardson Dopp, PA-C

## 2011-04-30 ENCOUNTER — Other Ambulatory Visit: Payer: Self-pay | Admitting: Internal Medicine

## 2011-05-01 LAB — CBC
HCT: 31.2 — ABNORMAL LOW
Hemoglobin: 10.4 — ABNORMAL LOW
MCV: 94.3
Platelets: 351
WBC: 9.1

## 2011-05-01 LAB — I-STAT 8, (EC8 V) (CONVERTED LAB)
BUN: 13
Chloride: 105
Glucose, Bld: 166 — ABNORMAL HIGH
Hemoglobin: 11.2 — ABNORMAL LOW
Potassium: 3.5
Sodium: 139
pH, Ven: 7.352 — ABNORMAL HIGH

## 2011-05-01 LAB — PROTIME-INR
INR: 2.6 — ABNORMAL HIGH
INR: 2.8 — ABNORMAL HIGH
Prothrombin Time: 28 — ABNORMAL HIGH
Prothrombin Time: 29.1 — ABNORMAL HIGH

## 2011-05-01 LAB — URINALYSIS, ROUTINE W REFLEX MICROSCOPIC
Bilirubin Urine: NEGATIVE
Hgb urine dipstick: NEGATIVE
Nitrite: NEGATIVE
Specific Gravity, Urine: 1.016
pH: 7

## 2011-05-01 LAB — DIFFERENTIAL
Eosinophils Absolute: 0.2
Eosinophils Relative: 3
Lymphs Abs: 1.4
Monocytes Absolute: 0.5

## 2011-05-01 LAB — CULTURE, BLOOD (ROUTINE X 2): Culture: NO GROWTH

## 2011-05-01 LAB — URINE MICROSCOPIC-ADD ON

## 2011-05-01 LAB — URINE CULTURE: Colony Count: 8000

## 2011-05-01 LAB — POCT I-STAT CREATININE
Creatinine, Ser: 0.9
Operator id: 198171

## 2011-05-01 LAB — BASIC METABOLIC PANEL
CO2: 26
Calcium: 8.7
Chloride: 105
Creatinine, Ser: 0.9
GFR calc Af Amer: >60
Glucose, Bld: 93

## 2011-05-01 LAB — POTASSIUM: Potassium: 3.8

## 2011-05-01 LAB — CK TOTAL AND CKMB (NOT AT ARMC)
Relative Index: INVALID
Total CK: 35

## 2011-05-01 LAB — APTT
aPTT: 53 — ABNORMAL HIGH
aPTT: 59 — ABNORMAL HIGH

## 2011-05-04 ENCOUNTER — Other Ambulatory Visit: Payer: Self-pay | Admitting: Internal Medicine

## 2011-05-05 ENCOUNTER — Encounter: Payer: Self-pay | Admitting: Cardiology

## 2011-05-07 ENCOUNTER — Encounter: Payer: Self-pay | Admitting: Cardiology

## 2011-05-07 ENCOUNTER — Ambulatory Visit (INDEPENDENT_AMBULATORY_CARE_PROVIDER_SITE_OTHER): Payer: Medicare Other | Admitting: Cardiology

## 2011-05-07 VITALS — BP 100/59 | HR 60 | Resp 18 | Ht 63.0 in | Wt 159.0 lb

## 2011-05-07 DIAGNOSIS — I2510 Atherosclerotic heart disease of native coronary artery without angina pectoris: Secondary | ICD-10-CM

## 2011-05-07 DIAGNOSIS — Z7901 Long term (current) use of anticoagulants: Secondary | ICD-10-CM

## 2011-05-07 DIAGNOSIS — I4891 Unspecified atrial fibrillation: Secondary | ICD-10-CM

## 2011-05-07 DIAGNOSIS — I050 Rheumatic mitral stenosis: Secondary | ICD-10-CM

## 2011-05-07 DIAGNOSIS — F419 Anxiety disorder, unspecified: Secondary | ICD-10-CM

## 2011-05-07 MED ORDER — ALPRAZOLAM 0.25 MG PO TABS: ORAL_TABLET | ORAL | Status: DC

## 2011-05-07 NOTE — Assessment & Plan Note (Signed)
Patient continues to have significant anxiety. I given a refill on her Xanax.

## 2011-05-07 NOTE — Assessment & Plan Note (Signed)
Atrial fibrillation appears to be well rate controlled. The patient can take when necessary metoprolol if needed

## 2011-05-07 NOTE — Progress Notes (Signed)
The patient is a 76 year old female with a history of chronic atrial fibrillation, status post single chamber [employer] pacemaker. The patient on chronic Coumadin but has a hard time staying therapeutic. She has a prior history of nonischemic cardiomyopathy but has now a normal ejection fraction of 60-65%. During her catheterization there was question of mitral stenosis, but by TEE there was only evidence of significant diastolic dysfunction with a moderate to severely dilated left atrium but no evidence of mitral stenosis. The patient has a history of anxiety. She reports occasional palpitations when she gets anxious. She now is also higher dose of calcium channel blocker. It appears that most of the time her heart rate is well controlled. She uses oxygen at night. She does report a brief palpitations when she's anxious. The patient has diabetes mellitus but metformin has been discontinued due to worsening renal function. She is monitored and followed by primary care physician regarding this problem.  Review of systems: Palpitations. No chest pain. No orthopnea PND. No abdominal pain no melena or hematochezia. Patient remains thirsty but doesn't drink much. Skin is dry. She reports no claudication.  Allergies/family history and social history is documented in the chart  Physical examination: Vital signs see below White female in no distress Neck exam: Normal carotids upstroke bilaterally with no carotid bruits Patient is edentulous Lungs diminished breath sounds bilaterally without any wheezing Heart heart: Irregular rate and rhythm with normal [id number]. 2/6 systolic murmur at the apex Abdomen soft nontender no rebound or guarding Extremity exam: No cyanosis clubbing or edema. Skin with decreased turgor Vascular: Soft visit posterior tibial pulses are 1+ bilaterally Psychiatric: Anxious but no symptoms of depression

## 2011-05-07 NOTE — Assessment & Plan Note (Signed)
No evidence of mitral stenosis. Significant atrial enlargement and patient remains in atrial fibrillation

## 2011-05-07 NOTE — Assessment & Plan Note (Signed)
No recurrent chest pain. 

## 2011-05-07 NOTE — Assessment & Plan Note (Signed)
Patient has difficulty with staying therapeutic with her PT/INR. We have discussed the option of possibly newer oral anticoagulants. The patient is interested in starting Xarelto I told her that we will need to check her renal function first as it appears historically that she has a worsening renal function requiring her to stop metformin. He GFR is not significantly below 30 mils per minute then we can start the patient on Xarelto 15 mg by mouth daily and discontinue warfarin

## 2011-05-07 NOTE — Patient Instructions (Signed)
Your physician recommends that you go to the [facility] for lab work today for [employer] begin Xarelto 15mg  after results of lab above.  Need to wait for lab results & call from office.   Increase fluid intake Xanax 0.25mg  - 60 tabs with one refill only  Your physician wants you to follow up in: 6 months.  You will receive a reminder letter in the mail one-two months in advance.  If you don't receive a letter, please call our office to schedule the follow up appointment

## 2011-05-11 LAB — GLUCOSE, CAPILLARY
Glucose-Capillary: 139 — ABNORMAL HIGH
Glucose-Capillary: 180 — ABNORMAL HIGH
Glucose-Capillary: 181 — ABNORMAL HIGH
Glucose-Capillary: 183 — ABNORMAL HIGH
Glucose-Capillary: 204 — ABNORMAL HIGH
Glucose-Capillary: 255 — ABNORMAL HIGH

## 2011-05-11 LAB — CBC
HCT: 26.2 — ABNORMAL LOW
HCT: 26.9 — ABNORMAL LOW
HCT: 28.8 — ABNORMAL LOW
Hemoglobin: 8.8 — ABNORMAL LOW
Hemoglobin: 9.6 — ABNORMAL LOW
MCHC: 33.5
MCHC: 33.6
MCHC: 34.1
MCV: 96.5
MCV: 97.6
MCV: 98.1
MCV: 98.4
Platelets: 223
Platelets: 225
Platelets: 251
RBC: 2.68 — ABNORMAL LOW
RBC: 2.74 — ABNORMAL LOW
RBC: 2.76 — ABNORMAL LOW
RBC: 2.93 — ABNORMAL LOW
RBC: 3.02 — ABNORMAL LOW
WBC: 13.5 — ABNORMAL HIGH
WBC: 8.2
WBC: 9

## 2011-05-11 LAB — BASIC METABOLIC PANEL
BUN: 15
BUN: 23
CO2: 22
CO2: 24
Calcium: 8.9
Chloride: 105
Chloride: 107
Chloride: 110
Creatinine, Ser: 0.91
GFR calc Af Amer: >60
GFR calc Af Amer: >60
Glucose, Bld: 161 — ABNORMAL HIGH
Potassium: 4.4
Potassium: 4.4
Sodium: 140

## 2011-05-11 LAB — DIFFERENTIAL
Basophils Relative: 0
Eosinophils Absolute: 0.4
Lymphs Abs: 1.7
Monocytes Relative: 7
Neutro Abs: 6.3
Neutrophils Relative %: 69

## 2011-05-11 LAB — BLOOD GAS, ARTERIAL
Acid-Base Excess: 0.9
Bicarbonate: 24.6 — ABNORMAL HIGH
O2 Saturation: 95.4
pO2, Arterial: 77.7 — ABNORMAL LOW

## 2011-05-11 LAB — COMPREHENSIVE METABOLIC PANEL
ALT: 18
AST: 20
Albumin: 3 — ABNORMAL LOW
Alkaline Phosphatase: 48
Alkaline Phosphatase: 60
BUN: 18
CO2: 20
CO2: 27
Calcium: 9.4
Chloride: 106
GFR calc Af Amer: >60
GFR calc non Af Amer: 56 — ABNORMAL LOW
GFR calc non Af Amer: 58 — ABNORMAL LOW
Glucose, Bld: 197 — ABNORMAL HIGH
Potassium: 3.7
Potassium: 4.1
Sodium: 139
Total Bilirubin: 0.6
Total Protein: 6.3

## 2011-05-11 LAB — LIPID PANEL
Cholesterol: 81
HDL: 21 — ABNORMAL LOW
LDL Cholesterol: 54
Total CHOL/HDL Ratio: 3.9
VLDL: 6

## 2011-05-11 LAB — PROTIME-INR
INR: 2.7 — ABNORMAL HIGH
INR: 3.1 — ABNORMAL HIGH
INR: 3.1 — ABNORMAL HIGH
INR: 3.3 — ABNORMAL HIGH
Prothrombin Time: 34 — ABNORMAL HIGH
Prothrombin Time: 34.7 — ABNORMAL HIGH

## 2011-05-11 LAB — CULTURE, BLOOD (ROUTINE X 2)

## 2011-05-11 LAB — FERRITIN: Ferritin: 178 (ref 10–291)

## 2011-05-11 LAB — IRON AND TIBC: UIBC: 142

## 2011-05-20 ENCOUNTER — Other Ambulatory Visit: Payer: Self-pay | Admitting: Cardiology

## 2011-05-28 LAB — CARDIAC PANEL(CRET KIN+CKTOT+MB+TROPI)
CK, MB: 0.8
CK, MB: 0.9
Relative Index: INVALID
Relative Index: INVALID
Troponin I: 0.02
Troponin I: 0.02

## 2011-05-28 LAB — CBC
HCT: 23.9 — ABNORMAL LOW
HCT: 24 — ABNORMAL LOW
HCT: 24.7 — ABNORMAL LOW
HCT: 28.8 — ABNORMAL LOW
HCT: 29.2 — ABNORMAL LOW
HCT: 29.7 — ABNORMAL LOW
Hemoglobin: 10.3 — ABNORMAL LOW
Hemoglobin: 9.9 — ABNORMAL LOW
Hemoglobin: 9.9 — ABNORMAL LOW
MCHC: 33.2
MCHC: 33.4
MCHC: 33.5
MCHC: 33.9
MCHC: 34
MCHC: 34.3
MCV: 91.5
MCV: 92.4
MCV: 92.4
MCV: 93.3
MCV: 93.4
MCV: 93.6
Platelets: 352
Platelets: 369
RBC: 2.56 — ABNORMAL LOW
RBC: 3.15 — ABNORMAL LOW
RBC: 3.21 — ABNORMAL LOW
RBC: 3.28 — ABNORMAL LOW
RDW: 14.3 — ABNORMAL HIGH
RDW: 14.8 — ABNORMAL HIGH
WBC: 10.1
WBC: 5.7
WBC: 6.4
WBC: 7
WBC: 7.3
WBC: 8

## 2011-05-28 LAB — OCCULT BLOOD X 1 CARD TO LAB, STOOL: Fecal Occult Bld: NEGATIVE

## 2011-05-28 LAB — IRON AND TIBC
Iron: 22 — ABNORMAL LOW
Saturation Ratios: 13 — ABNORMAL LOW
TIBC: 165 — ABNORMAL LOW
UIBC: 143

## 2011-05-28 LAB — URINALYSIS, ROUTINE W REFLEX MICROSCOPIC
Bilirubin Urine: NEGATIVE
Glucose, UA: NEGATIVE
Hgb urine dipstick: NEGATIVE
Nitrite: NEGATIVE
Protein, ur: NEGATIVE
Specific Gravity, Urine: 1.015 (ref 1.005–1.035)
Urobilinogen, UA: 0.2
pH: 6.5

## 2011-05-28 LAB — LACTATE DEHYDROGENASE: LDH: 179

## 2011-05-28 LAB — POCT CARDIAC MARKERS
CKMB, poc: 1.5
Myoglobin, poc: 94
Operator id: 151321

## 2011-05-28 LAB — BASIC METABOLIC PANEL
BUN: 17
BUN: 23
CO2: 26
CO2: 26
CO2: 28
Chloride: 101
Chloride: 103
Chloride: 105
Chloride: 105
Chloride: 109
Creatinine, Ser: 1.1
Creatinine, Ser: 1.17
Creatinine, Ser: 1.23 — ABNORMAL HIGH
GFR calc Af Amer: 52 — ABNORMAL LOW
GFR calc Af Amer: 57 — ABNORMAL LOW
GFR calc Af Amer: 60 — ABNORMAL LOW
GFR calc Af Amer: >60
GFR calc non Af Amer: 39 — ABNORMAL LOW
GFR calc non Af Amer: 47 — ABNORMAL LOW
GFR calc non Af Amer: 48 — ABNORMAL LOW
Glucose, Bld: 107 — ABNORMAL HIGH
Glucose, Bld: 111 — ABNORMAL HIGH
Potassium: 3.7
Potassium: 4
Potassium: 4
Potassium: 4.2
Potassium: 4.3
Potassium: 4.3
Potassium: 4.4
Sodium: 137
Sodium: 139
Sodium: 139

## 2011-05-28 LAB — CK TOTAL AND CKMB (NOT AT ARMC)
Relative Index: INVALID
Total CK: 44

## 2011-05-28 LAB — URINE MICROSCOPIC-ADD ON

## 2011-05-28 LAB — CLOSTRIDIUM DIFFICILE EIA: C difficile Toxins A+B, EIA: NEGATIVE

## 2011-05-28 LAB — COMPREHENSIVE METABOLIC PANEL
ALT: 12
AST: 14
CO2: 25
Calcium: 8.3 — ABNORMAL LOW
GFR calc Af Amer: >60
Sodium: 134 — ABNORMAL LOW
Total Protein: 6.3

## 2011-05-28 LAB — I-STAT 8, (EC8 V) (CONVERTED LAB)
BUN: 15
Chloride: 105
Glucose, Bld: 116 — ABNORMAL HIGH
HCT: 31 — ABNORMAL LOW
Potassium: 3.5
pCO2, Ven: 35.4 — ABNORMAL LOW
pH, Ven: 7.437 — ABNORMAL HIGH

## 2011-05-28 LAB — CROSSMATCH

## 2011-05-28 LAB — DIFFERENTIAL
Basophils Absolute: 0
Basophils Relative: 0
Monocytes Absolute: 0.6
Neutro Abs: 7.2
Neutrophils Relative %: 80 — ABNORMAL HIGH

## 2011-05-28 LAB — POCT I-STAT CREATININE
Creatinine, Ser: 1.1
Operator id: 151321

## 2011-05-28 LAB — RETICULOCYTES
RBC.: 2.71 — ABNORMAL LOW
Retic Ct Pct: 1.3

## 2011-05-28 LAB — PROTIME-INR
INR: 2.4 — ABNORMAL HIGH
INR: 3.7 — ABNORMAL HIGH
Prothrombin Time: 44.9 — ABNORMAL HIGH

## 2011-05-28 LAB — STOOL CULTURE

## 2011-05-28 LAB — CULTURE, BLOOD (ROUTINE X 2)
Culture: NO GROWTH
Culture: NO GROWTH

## 2011-05-28 LAB — VITAMIN B12
Vitamin B-12: 238 (ref 211–911)
Vitamin B-12: 303 (ref 211–911)

## 2011-05-28 LAB — HEPARIN LEVEL (UNFRACTIONATED): Heparin Unfractionated: 0.21 — ABNORMAL LOW

## 2011-05-28 LAB — LIPID PANEL: VLDL: 7

## 2011-05-28 LAB — SODIUM, URINE, RANDOM: Sodium, Ur: 90

## 2011-05-28 LAB — HAPTOGLOBIN: Haptoglobin: 330 — ABNORMAL HIGH

## 2011-06-28 ENCOUNTER — Telehealth: Payer: Self-pay | Admitting: Physician Assistant

## 2011-06-28 NOTE — Telephone Encounter (Signed)
Patient called in with concerns over blood pressure and palpitations.  She has permanent AFib and is on coumadin. BP last recorded 140/80.  Has gone as high as 150/. She denies chest pain, SOB, syncope. She takes metoprolol 25 mg BID and has already taken 3x today.   I recommended that she take an extra 1/2 of metoprolol to help with her palpitations.  If she feels worse tonight she can go to the ED.  Otherwise, I will have the office call her for an appt this week.  Richardson Dopp, PA-C  10:29 PM 06/28/2011

## 2011-06-30 ENCOUNTER — Encounter: Payer: Self-pay | Admitting: *Deleted

## 2011-07-03 ENCOUNTER — Ambulatory Visit (INDEPENDENT_AMBULATORY_CARE_PROVIDER_SITE_OTHER): Payer: Medicare Other | Admitting: Cardiology

## 2011-07-03 ENCOUNTER — Encounter: Payer: Self-pay | Admitting: Cardiology

## 2011-07-03 DIAGNOSIS — Z950 Presence of cardiac pacemaker: Secondary | ICD-10-CM

## 2011-07-03 DIAGNOSIS — Z7901 Long term (current) use of anticoagulants: Secondary | ICD-10-CM

## 2011-07-03 DIAGNOSIS — I10 Essential (primary) hypertension: Secondary | ICD-10-CM

## 2011-07-03 DIAGNOSIS — I4891 Unspecified atrial fibrillation: Secondary | ICD-10-CM

## 2011-07-03 NOTE — Assessment & Plan Note (Signed)
Goal INR 2-3. Recent hemoglobin normal.

## 2011-07-03 NOTE — Progress Notes (Signed)
[id number] female with a history of permanent atrial fibrillation, status post single chamber [employer] Jude pacemaker. She has a prior history of nonischemic cardiomyopathy but has now a normal ejection fraction of 60-65%. During her catheterization there was question of mitral stenosis, but by TEE there was only evidence of significant diastolic dysfunction with a moderate to severely dilated left atrium but no evidence of mitral stenosis. She last saw Dr. Dannielle Burn in Sept of 2012. Since then, patient was seen in the emergency room on November 19 with complaints of palpitations. Her daughter was present. She apparently continued to have palpitations in the emergency room and based on her daughter's report her heart rate was 60 on the monitor. She has had palpitations since her pacemaker was placed. Review of her laboratories showed a normal potassium. Hemoglobin was 12.7. The patient otherwise denies dyspnea, exertional chest pain or syncope. No bleeding.   Current Outpatient Prescriptions  Medication Sig Dispense Refill  . ALPRAZolam (XANAX) 0.25 MG tablet May take one tab every 8 hours as needed for severe anxiety  60 tablet  1  . digoxin (LANOXIN) 0.125 MG tablet TAKE ONE TABLET BY MOUTH ONE TIME DAILY  30 tablet  4  . diltiazem (CARTIA XT) 240 MG 24 hr capsule Take 1 capsule (240 mg total) by mouth daily.  30 capsule  6  . Fluticasone-Salmeterol (ADVAIR DISKUS) 250-50 MCG/DOSE AEPB Inhale 1 puff into the lungs every 12 (twelve) hours.        . furosemide (LASIX) 40 MG tablet Take 40 mg by mouth daily.        [name] glyBURIDE (DIABETA) 5 MG tablet Take 2.5 mg by mouth daily with breakfast.       . HYDROcodone-acetaminophen (VICODIN) 5-500 MG per tablet Take by mouth. Takes 1/2 tab as needed       . metoprolol tartrate (LOPRESSOR) 25 MG tablet Take 1 tablet (25 mg total) by mouth 2 (two) times daily.  60 tablet  5  . omeprazole (PRILOSEC) 40 MG capsule Take 40 mg by mouth daily.        . simvastatin (ZOCOR)  10 MG tablet TAKE ONE TABLET BY MOUTH AT BEDTIME  30 tablet  4  . tiotropium (SPIRIVA) 18 MCG inhalation capsule Place 18 mcg into inhaler and inhale daily.        [name] warfarin (COUMADIN) 2 MG tablet Take 2 mg by mouth daily. Managed by PMD      . zafirlukast (ACCOLATE) 20 MG tablet Take 20 mg by mouth 2 (two) times daily.           Past Medical History  Diagnosis Date  . Permanent atrial fibrillation     Tachybradycardia syndrome on Coumadin anticoagulation  . CAD (coronary artery disease) 2011    Catheterization August 2011 mild nonobstructive coronary artery disease complicated by large left rectus sheath hematoma status post evacuation Coumadin restarted without recurrence  . Diabetes mellitus   . Chronic airway obstruction, not elsewhere classified   . Type II or unspecified type diabetes mellitus without mention of complication, not stated as uncontrolled   . Anemia, unspecified   . Chronic kidney disease, stage II (mild)   . Unspecified hypertensive kidney disease with chronic kidney disease stage I through stage IV, or unspecified   . Tachycardia-bradycardia     s/p PPM by Dr Caryl Comes  . GERD (gastroesophageal reflux disease)   . Abnormal CT of the chest     Mediastinal and hilar adenopathy followed by Dr. Koleen Nimrod  in [place]  . Congestive heart failure, unspecified     EF now 60%, previous nonischemic cardiomyopathy  . Pacemaker     Single chamber [facility]. Jude ACCENT [id number] SN 719 04/[birth year]/1959 10/31/2010  . Mitral stenosis     Felt to be moderate by catheterization, but TEE only show mild mitral stenosis. Also no significant mitral regurgitation.    Past Surgical History  Procedure Date  . Single-chamber pacemaker implantation 3/12    by Dr Caryl Comes  . Evacuation of retroperitoneal and left rectus sheath     History   Social History  . Marital Status: Single    Spouse Name: N/A    Number of Children: 2  . Years of Education: N/A   Occupational History  . RETIRED    Social  History Main Topics  . Smoking status: Former Smoker -- 2.0 packs/day for 45 years    Types: Cigarettes    Quit date: 08/10/2006  . Smokeless tobacco: Never Used  . Alcohol Use: No  . Drug Use: No  . Sexually Active: Not on file   Other Topics Concern  . Not on file   Social History Narrative  . No narrative on file    ROS: complains of fatigue but no fevers or chills, productive cough, hemoptysis, dysphasia, odynophagia, melena, hematochezia, dysuria, hematuria, rash, seizure activity, orthopnea, PND, pedal edema, claudication. Remaining systems are negative.  Physical Exam: Well-developed well-nourished in no acute distress.  Skin is warm and dry.  HEENT is normal.  Neck is supple. No thyromegaly.  Chest is clear to auscultation with normal expansion.  Cardiovascular exam is regular rate and rhythm.  Abdominal exam nontender or distended. No masses palpated. Extremities show no edema. neuro grossly intact  ECG June 29, 2011-ventricular pacing with underlying atrial fibrillation.

## 2011-07-03 NOTE — Patient Instructions (Signed)
Continue all current medications. Your physician wants you to follow up in:  3 months.  You will receive a reminder letter in the mail one-two months in advance.  If you don't receive a letter, please call our office to schedule the follow up appointment

## 2011-07-03 NOTE — Assessment & Plan Note (Signed)
Management per electrophysiology. 

## 2011-07-03 NOTE — Assessment & Plan Note (Signed)
Continue present medications for rate control as well as Coumadin. She continues to have problems with palpitations. However she was in the emergency room on the monitor having palpitations and per her daughter her heart rate was 60. Previous notes suggest there may be a component of anxiety. Continue present medications. Patient reassured.

## 2011-07-03 NOTE — Assessment & Plan Note (Signed)
Blood pressure controlled. Continue present medications. 

## 2011-07-11 DIAGNOSIS — K5792 Diverticulitis of intestine, part unspecified, without perforation or abscess without bleeding: Secondary | ICD-10-CM

## 2011-07-11 HISTORY — DX: Diverticulitis of intestine, part unspecified, without perforation or abscess without bleeding: K57.92

## 2011-07-15 ENCOUNTER — Telehealth: Payer: Self-pay | Admitting: Physician Assistant

## 2011-07-15 NOTE — Telephone Encounter (Signed)
Patient called this evening with question about blood pressure. BP was 135/65 and then at a later time was 114/65, HR upper 60's. She was worried this was too low and was wondering if she should still take her evening metoprolol. She otherwise feels fine, no CP/SOB or lightheadness. I see that her BP was even lower at her last OV, 98 systolic. I advised her that 114/65 is a normal blood pressure and she should continue to take her medicine as directed. She expressed understanding and gratitude.

## 2011-07-23 ENCOUNTER — Ambulatory Visit: Payer: Medicare Other | Admitting: Cardiology

## 2011-08-10 ENCOUNTER — Ambulatory Visit: Payer: Medicare Other | Admitting: Cardiology

## 2011-08-25 DIAGNOSIS — N6459 Other signs and symptoms in breast: Secondary | ICD-10-CM | POA: Diagnosis not present

## 2011-09-01 DIAGNOSIS — N63 Unspecified lump in unspecified breast: Secondary | ICD-10-CM | POA: Diagnosis not present

## 2011-09-01 DIAGNOSIS — J449 Chronic obstructive pulmonary disease, unspecified: Secondary | ICD-10-CM | POA: Diagnosis not present

## 2011-09-01 DIAGNOSIS — Z87891 Personal history of nicotine dependence: Secondary | ICD-10-CM | POA: Diagnosis not present

## 2011-09-01 DIAGNOSIS — Z79899 Other long term (current) drug therapy: Secondary | ICD-10-CM | POA: Diagnosis not present

## 2011-09-01 DIAGNOSIS — IMO0002 Reserved for concepts with insufficient information to code with codable children: Secondary | ICD-10-CM | POA: Diagnosis not present

## 2011-09-01 DIAGNOSIS — E119 Type 2 diabetes mellitus without complications: Secondary | ICD-10-CM | POA: Diagnosis not present

## 2011-09-01 DIAGNOSIS — R109 Unspecified abdominal pain: Secondary | ICD-10-CM | POA: Diagnosis not present

## 2011-09-01 DIAGNOSIS — Z950 Presence of cardiac pacemaker: Secondary | ICD-10-CM | POA: Diagnosis not present

## 2011-09-01 DIAGNOSIS — R634 Abnormal weight loss: Secondary | ICD-10-CM | POA: Diagnosis not present

## 2011-09-01 DIAGNOSIS — R928 Other abnormal and inconclusive findings on diagnostic imaging of breast: Secondary | ICD-10-CM | POA: Diagnosis not present

## 2011-09-01 DIAGNOSIS — Z7901 Long term (current) use of anticoagulants: Secondary | ICD-10-CM | POA: Diagnosis not present

## 2011-09-01 DIAGNOSIS — N390 Urinary tract infection, site not specified: Secondary | ICD-10-CM | POA: Diagnosis not present

## 2011-09-06 DIAGNOSIS — IMO0002 Reserved for concepts with insufficient information to code with codable children: Secondary | ICD-10-CM | POA: Diagnosis not present

## 2011-09-06 DIAGNOSIS — R1011 Right upper quadrant pain: Secondary | ICD-10-CM | POA: Diagnosis not present

## 2011-09-06 DIAGNOSIS — Z950 Presence of cardiac pacemaker: Secondary | ICD-10-CM | POA: Diagnosis not present

## 2011-09-06 DIAGNOSIS — K432 Incisional hernia without obstruction or gangrene: Secondary | ICD-10-CM | POA: Diagnosis not present

## 2011-09-06 DIAGNOSIS — R109 Unspecified abdominal pain: Secondary | ICD-10-CM | POA: Diagnosis not present

## 2011-09-06 DIAGNOSIS — Z9089 Acquired absence of other organs: Secondary | ICD-10-CM | POA: Diagnosis not present

## 2011-09-06 DIAGNOSIS — R079 Chest pain, unspecified: Secondary | ICD-10-CM | POA: Diagnosis not present

## 2011-09-06 DIAGNOSIS — N390 Urinary tract infection, site not specified: Secondary | ICD-10-CM | POA: Diagnosis not present

## 2011-09-06 DIAGNOSIS — Z7901 Long term (current) use of anticoagulants: Secondary | ICD-10-CM | POA: Diagnosis not present

## 2011-09-06 DIAGNOSIS — E119 Type 2 diabetes mellitus without complications: Secondary | ICD-10-CM | POA: Diagnosis not present

## 2011-09-06 DIAGNOSIS — J449 Chronic obstructive pulmonary disease, unspecified: Secondary | ICD-10-CM | POA: Diagnosis not present

## 2011-09-06 DIAGNOSIS — R1031 Right lower quadrant pain: Secondary | ICD-10-CM | POA: Diagnosis not present

## 2011-09-06 DIAGNOSIS — Z79899 Other long term (current) drug therapy: Secondary | ICD-10-CM | POA: Diagnosis not present

## 2011-09-06 LAB — COMPREHENSIVE METABOLIC PANEL
RBC: 3.61
WBC: 8
platelet count: 282

## 2011-09-06 LAB — CBC WITH DIFFERENTIAL/PLATELET
Calcium: 9.4 mg/dL
Carbon Monoxide, Blood: 26
Chloride: 102 mmol/L
Sodium: 136 mmol/L — AB (ref 137–147)

## 2011-09-07 DIAGNOSIS — I4891 Unspecified atrial fibrillation: Secondary | ICD-10-CM | POA: Diagnosis not present

## 2011-09-17 DIAGNOSIS — K219 Gastro-esophageal reflux disease without esophagitis: Secondary | ICD-10-CM | POA: Diagnosis not present

## 2011-09-17 DIAGNOSIS — N390 Urinary tract infection, site not specified: Secondary | ICD-10-CM | POA: Diagnosis not present

## 2011-09-21 ENCOUNTER — Other Ambulatory Visit: Payer: Self-pay

## 2011-09-21 DIAGNOSIS — Z79899 Other long term (current) drug therapy: Secondary | ICD-10-CM | POA: Diagnosis not present

## 2011-09-21 DIAGNOSIS — Z950 Presence of cardiac pacemaker: Secondary | ICD-10-CM | POA: Diagnosis not present

## 2011-09-21 DIAGNOSIS — E119 Type 2 diabetes mellitus without complications: Secondary | ICD-10-CM | POA: Diagnosis not present

## 2011-09-21 DIAGNOSIS — R0789 Other chest pain: Secondary | ICD-10-CM | POA: Diagnosis not present

## 2011-09-21 DIAGNOSIS — R000 Tachycardia, unspecified: Secondary | ICD-10-CM | POA: Diagnosis not present

## 2011-09-21 DIAGNOSIS — F411 Generalized anxiety disorder: Secondary | ICD-10-CM | POA: Diagnosis not present

## 2011-09-21 DIAGNOSIS — R51 Headache: Secondary | ICD-10-CM | POA: Diagnosis not present

## 2011-09-21 DIAGNOSIS — Z7901 Long term (current) use of anticoagulants: Secondary | ICD-10-CM | POA: Diagnosis not present

## 2011-09-21 DIAGNOSIS — R079 Chest pain, unspecified: Secondary | ICD-10-CM | POA: Diagnosis not present

## 2011-09-21 DIAGNOSIS — J449 Chronic obstructive pulmonary disease, unspecified: Secondary | ICD-10-CM | POA: Diagnosis not present

## 2011-09-21 DIAGNOSIS — IMO0002 Reserved for concepts with insufficient information to code with codable children: Secondary | ICD-10-CM | POA: Diagnosis not present

## 2011-09-21 DIAGNOSIS — I4510 Unspecified right bundle-branch block: Secondary | ICD-10-CM | POA: Diagnosis not present

## 2011-09-21 MED ORDER — DIGOXIN 125 MCG PO TABS: 125.0000 ug | ORAL_TABLET | Freq: Every day | ORAL | Status: DC

## 2011-09-21 NOTE — Telephone Encounter (Signed)
..   Requested Prescriptions   Signed Prescriptions Disp Refills  . digoxin (LANOXIN) 0.125 MG tablet 30 tablet 9    Sig: Take 1 tablet (125 mcg total) by mouth daily.    Authorizing Provider: Lelon Perla    Ordering User: Ardis Hughs, Carnelius Hammitt Jerilynn Mages

## 2011-09-24 DIAGNOSIS — E119 Type 2 diabetes mellitus without complications: Secondary | ICD-10-CM | POA: Diagnosis not present

## 2011-09-24 DIAGNOSIS — L609 Nail disorder, unspecified: Secondary | ICD-10-CM | POA: Diagnosis not present

## 2011-09-24 DIAGNOSIS — E1159 Type 2 diabetes mellitus with other circulatory complications: Secondary | ICD-10-CM | POA: Diagnosis not present

## 2011-09-24 DIAGNOSIS — L851 Acquired keratosis [keratoderma] palmaris et plantaris: Secondary | ICD-10-CM | POA: Diagnosis not present

## 2011-09-28 ENCOUNTER — Ambulatory Visit (INDEPENDENT_AMBULATORY_CARE_PROVIDER_SITE_OTHER): Payer: Medicare Other | Admitting: Gastroenterology

## 2011-09-28 ENCOUNTER — Encounter: Payer: Self-pay | Admitting: Gastroenterology

## 2011-09-28 VITALS — BP 100/50 | HR 60 | Temp 98.2°F | Ht 63.0 in | Wt 147.4 lb

## 2011-09-28 DIAGNOSIS — R634 Abnormal weight loss: Secondary | ICD-10-CM

## 2011-09-28 DIAGNOSIS — R935 Abnormal findings on diagnostic imaging of other abdominal regions, including retroperitoneum: Secondary | ICD-10-CM

## 2011-09-28 DIAGNOSIS — K3189 Other diseases of stomach and duodenum: Secondary | ICD-10-CM | POA: Diagnosis not present

## 2011-09-28 DIAGNOSIS — D649 Anemia, unspecified: Secondary | ICD-10-CM

## 2011-09-28 DIAGNOSIS — R109 Unspecified abdominal pain: Secondary | ICD-10-CM | POA: Diagnosis not present

## 2011-09-28 DIAGNOSIS — R1013 Epigastric pain: Secondary | ICD-10-CM | POA: Diagnosis not present

## 2011-09-28 DIAGNOSIS — K5900 Constipation, unspecified: Secondary | ICD-10-CM

## 2011-09-28 MED ORDER — DOCUSATE SODIUM 100 MG PO CAPS: 100.0000 mg | ORAL_CAPSULE | Freq: Two times a day (BID) | ORAL | Status: DC

## 2011-09-28 MED ORDER — POLYETHYLENE GLYCOL 3350 17 GM/SCOOP PO POWD: 17.0000 g | Freq: Every day | ORAL | Status: AC

## 2011-09-28 NOTE — Patient Instructions (Signed)
We are requesting the reports from your procedures at [facility]. This will help [place] decide what procedures need to be done in the near future.   We also are getting updated blood work from you today. We will call you with these results.   For constipation: start taking Colace, a stool softener, once to two times a day. I have sent this to your pharmacy. Also, you may take 1 capful of Miralax daily for constipation.   If you have any worsening abdominal pain, fever, chills, please call our office immediately.   We will be in touch shortly regarding the next step in your work-up!

## 2011-09-28 NOTE — Progress Notes (Addendum)
Primary Care Physician:  [name], FNP, FNP Primary Gastroenterologist:  Dr. Oneida Alar   Chief Complaint  Patient presents with  . Constipation  . Heartburn    HPI:   [name] is a 77 year old female who presents today at the request of [name], FNP, secondary to weight loss. Pt reports weighing 165 several months ago. Currently weighs 147. Documented weight of 152 in Jan 2013 at PCP office. Records somewhat scattered at time of visit. Pt reports last EGD/colonoscopy at [facility] several years ago. Unable to retrieve currently. Reports this was normal. Noted possible hx of diverticulitis in Dec 2012 from referring physician, but I do not have CT reports. Pt reports hx of anemia as well.   Reports burning in esophagus. No dysphagia. +epigastric burning, intermittent.  Not aggravated by food. Only eating soup, eggs, half a sandwhich. Decreased po intake, decreased appetite. No N/V.   Lower abdominal discomfort, burning, sometimes better after BM. Bloated. No fever. Wakes up sometimes in am with sweats.   + Constipation, won't have BM unless drink prune juice, takes MOM once a month. No blood in stool. Constipation worsening in last 5-6 mos.    Interesting CT reports in July 2012 of last year with biliary dilation. No HFP on file at that time. Was performed due to ED presentation. Follow-up CT at outside facility [facility]) with biliary dilation stable with 2 subsequent scans. CBD 1.52mm at the time.   [facility] CT July 2012 IMPRESSION:  Abnormal intrahepatic biliary dilatation with dilatation of the  common bile duct and common hepatic duct. Common hepatic duct measures 75mm, common bile duct measures 18 mm in diameter. No obvious and obstructing lesion is visible although there is a non-inflamed periampullary duodenal diverticulum.  There is a single enlarged porta hepatis node with short axis  diameter 1.4 cm. This is nonspecific with regard to benign/malignant origin, but  statistically is more likely to be  reactive.  Diverticulosis. Atherosclerosis. Lower lumbar spondylosis and degenerative disc disease.  Suspected mild air trapping in the lung bases.   ADDENDUM: Received records from CT at [facility] 07/2011: descending and sigmoid colon diverticulitis.   [facility] CT Sep 01, 2011: circumferential wall thickening in mid-transverese colon unable to be excluded, could be due to peristalsis. Stable biliary dilatation s/p chole   [facility] CT Sep 06, 2011: CBD 1.6 mm diameter, stable recurrent ventral hernia containing non-dilated loops of transverse colon and small bowel, stable biliary ductal dilation, s/p chole  Past Medical History  Diagnosis Date  . Permanent atrial fibrillation     Tachybradycardia syndrome on Coumadin anticoagulation  . CAD (coronary artery disease) 2011    Catheterization August 2011 mild nonobstructive coronary artery disease complicated by large left rectus sheath hematoma status post evacuation Coumadin restarted without recurrence  . Diabetes mellitus   . Chronic airway obstruction, not elsewhere classified   . Type II or unspecified type diabetes mellitus without mention of complication, not stated as uncontrolled   . Anemia, unspecified   . Chronic kidney disease, stage II (mild)   . Unspecified hypertensive kidney disease with chronic kidney disease stage I through stage IV, or unspecified   . Tachycardia-bradycardia     s/p PPM by Dr Caryl Comes  . GERD (gastroesophageal reflux disease)   . Abnormal CT of the chest     Mediastinal and hilar adenopathy followed by Dr. Koleen Nimrod in [place]  . Congestive heart failure, unspecified     EF now 60%, previous nonischemic cardiomyopathy  . Pacemaker  Single chamber [employer]. Jude ACCENT [id number] SN 719 04/11/1959 10/31/2010  . Mitral stenosis     Felt to be moderate by catheterization, but TEE only show mild mitral stenosis. Also no significant mitral regurgitation.  . Hypercholesterolemia   .  Diverticulitis Dec 2012    Past Surgical History  Procedure Date  . Single-chamber pacemaker implantation 3/12    by Dr Caryl Comes  . Evacuation of retroperitoneal and left rectus sheath   . Cholecystectomy     40+ years ago  . Hernia repair     X3  . Pilonidal cyst / sinus excision     Current Outpatient Prescriptions  Medication Sig Dispense Refill  . ALPRAZolam (XANAX) 0.25 MG tablet May take one tab every 8 hours as needed for severe anxiety  60 tablet  1  . digoxin (LANOXIN) 0.125 MG tablet Take 1 tablet (125 mcg total) by mouth daily.  30 tablet  9  . diltiazem (CARTIA XT) 240 MG 24 hr capsule Take 1 capsule (240 mg total) by mouth daily.  30 capsule  6  . Fluticasone-Salmeterol (ADVAIR DISKUS) 250-50 MCG/DOSE AEPB Inhale 1 puff into the lungs every 12 (twelve) hours.        . furosemide (LASIX) 40 MG tablet Take 40 mg by mouth daily.        [name] glyBURIDE (DIABETA) 5 MG tablet Take 2.5 mg by mouth daily with breakfast.       . HYDROcodone-acetaminophen (VICODIN) 5-500 MG per tablet Take by mouth. Takes 1/2 tab as needed       . metoprolol tartrate (LOPRESSOR) 25 MG tablet Take 1 tablet (25 mg total) by mouth 2 (two) times daily.  60 tablet  5  . omeprazole (PRILOSEC) 40 MG capsule Take 40 mg by mouth daily.        . simvastatin (ZOCOR) 10 MG tablet TAKE ONE TABLET BY MOUTH AT BEDTIME  30 tablet  4  . tiotropium (SPIRIVA) 18 MCG inhalation capsule Place 18 mcg into inhaler and inhale daily.        [name] warfarin (COUMADIN) 2 MG tablet Take 2 mg by mouth daily. Managed by PMD      . zafirlukast (ACCOLATE) 20 MG tablet Take 20 mg by mouth 2 (two) times daily.        [name] docusate sodium (COLACE) 100 MG capsule Take 1 capsule (100 mg total) by mouth 2 (two) times daily.  60 capsule  1  . polyethylene glycol powder (GLYCOLAX/MIRALAX) powder Take 17 g by mouth daily. As needed for a bowel movement.  255 g  1    Allergies as of 09/28/2011 - Review Complete 09/28/2011  Allergen Reaction Noted  .  Tramadol Nausea Only 12/03/2010    Family History  Problem Relation Age of Onset  . Cancer Mother     Uterus  . Cancer Daughter     kidney cancer, in remission  . Heart disease Father   . Colon cancer Neg Hx     History   Social History  . Marital Status: Single    Spouse Name: N/A    Number of Children: 2  . Years of Education: N/A   Occupational History  . RETIRED    Social History Main Topics  . Smoking status: Former Smoker -- 2.0 packs/day for 45 years    Types: Cigarettes    Quit date: 08/10/2006  . Smokeless tobacco: Never Used  . Alcohol Use: No  . Drug Use: No  . Sexually Active:  Not on file   Other Topics Concern  . Not on file   Social History Narrative  . No narrative on file    Review of Systems: Gen: SEE HPI CV: Denies chest pain, heart palpitations, peripheral edema, syncope.  Resp: Denies shortness of breath at rest or with exertion. Denies wheezing or cough.  GI: SEE HPI GU : Denies urinary burning, urinary frequency, urinary hesitancy MS: Denies joint pain, muscle weakness, cramps, or limitation of movement.  Derm: Denies rash, itching, dry skin Psych: Denies depression, anxiety, memory loss, and confusion Heme: Denies bruising, bleeding, and enlarged lymph nodes.  Physical Exam: BP 100/50  Pulse 60  Temp(Src) 98.2 F (36.8 C) (Temporal)  Ht 5\' 3"  (1.6 m)  Wt 147 lb 6.4 oz (66.86 kg)  BMI 26.11 kg/m2 General:   Alert and oriented. Pleasant and cooperative. Well-nourished and well-developed.  Head:  Normocephalic and atraumatic. Eyes:  Without icterus, sclera clear and conjunctiva pink.  Ears:  Normal auditory acuity. Nose:  No deformity, discharge,  or lesions. Mouth:  No dentition Neck:  Supple, without mass or thyromegaly. Lungs:  Mild expiratory wheeze bilaterally Heart:  S1, S2 present  Abdomen:  +BS, soft, mildly TTP epigastric/LUQ, LLQ/lower abdomen, non-distended. No HSM noted. No guarding or rebound. No masses appreciated.    Rectal:  Deferred  Msk:  Symmetrical without gross deformities. Normal posture. Extremities:  Without clubbing or edema. Neurologic:  Alert and  oriented x4;  grossly normal neurologically. Skin:  Intact without significant lesions or rashes. Cervical Nodes:  No significant cervical adenopathy. Psych:  Alert and cooperative. Normal mood and affect.

## 2011-09-29 DIAGNOSIS — R935 Abnormal findings on diagnostic imaging of other abdominal regions, including retroperitoneum: Secondary | ICD-10-CM | POA: Insufficient documentation

## 2011-09-29 DIAGNOSIS — K5900 Constipation, unspecified: Secondary | ICD-10-CM | POA: Insufficient documentation

## 2011-09-29 DIAGNOSIS — R634 Abnormal weight loss: Secondary | ICD-10-CM | POA: Insufficient documentation

## 2011-09-29 DIAGNOSIS — R109 Unspecified abdominal pain: Secondary | ICD-10-CM | POA: Insufficient documentation

## 2011-09-29 DIAGNOSIS — R1013 Epigastric pain: Secondary | ICD-10-CM | POA: Insufficient documentation

## 2011-09-29 LAB — HEPATIC FUNCTION PANEL
AST: 20 U/L (ref 0–37)
Albumin: 4 g/dL (ref 3.5–5.2)
Alkaline Phosphatase: 85 U/L (ref 39–117)
Indirect Bilirubin: 0.4 mg/dL (ref 0.0–0.9)
Total Bilirubin: 0.5 mg/dL (ref 0.3–1.2)

## 2011-09-29 LAB — CBC WITH DIFFERENTIAL/PLATELET
Eosinophils Absolute: 0.1 10*3/uL (ref 0.0–0.7)
Eosinophils Relative: 1 % (ref 0–5)
Hemoglobin: 11.3 g/dL — ABNORMAL LOW (ref 12.0–15.0)
Lymphocytes Relative: 21 % (ref 12–46)
Lymphs Abs: 2 10*3/uL (ref 0.7–4.0)
MCH: 30.3 pg (ref 26.0–34.0)
MCV: 99.5 fL (ref 78.0–100.0)
Monocytes Relative: 6 % (ref 3–12)
Neutrophils Relative %: 72 % (ref 43–77)
RBC: 3.73 MIL/uL — ABNORMAL LOW (ref 3.87–5.11)
WBC: 9.7 10*3/uL (ref 4.0–10.5)

## 2011-09-29 LAB — BASIC METABOLIC PANEL
BUN: 34 mg/dL — ABNORMAL HIGH (ref 6–23)
CO2: 29 mEq/L (ref 19–32)
Calcium: 9.2 mg/dL (ref 8.4–10.5)
Chloride: 107 mEq/L (ref 96–112)
Creat: 1.34 mg/dL — ABNORMAL HIGH (ref 0.50–1.10)

## 2011-09-29 NOTE — Assessment & Plan Note (Signed)
Update CBC today. 

## 2011-09-29 NOTE — Assessment & Plan Note (Signed)
See documentation above, July 2012 CT scan with abnormal dilation of hepatic and common bile ducts. [facility] CT[facility] in Jan 2013 with stable CBD dilation. No LFTs to correlate with these scans. Update HFP today. May need further assessment for hepatobiliary component in future after EGD/TCS.

## 2011-09-29 NOTE — Assessment & Plan Note (Signed)
Close to 20 lbs self-reported wt loss, decrease po intake, decrease appetite. No N/V. +dyspepsia to be addressed with EGD. Likely wt loss r/t decreased nutrition, doubt underlying malignancy but unable to be excluded. EGD/TCS to be done in near future, labs to include CBC, HFP, BMP.

## 2011-09-29 NOTE — Assessment & Plan Note (Signed)
Lower abdominal discomfort, burning, better after BM. Supposedly hx of diverticulitis in Dec 2012, do not have CT reports currently. Unsure timing of last TCS. Will retrieve reports if possible, need to proceed with TCS in near future. Pt not acutely ill at this time, doubt dealing with acute diverticulitis currently. Afebrile. Question abd pain secondary to constipation.

## 2011-09-29 NOTE — Assessment & Plan Note (Addendum)
Epigastric pain, intermittent, unrelated to eating/drinking. Associated wt loss due to decreased appetite. Reportedly last EGD several years ago at [facility], unable to retrieve records currently. Will likely be proceeding with EGD in near future. Currently on Prilosec 40 daily. Hx of afib, chronic coumadin, will need to hold coumadin X 4 days.  Attempt to retrieve any EGD reports: UPDATE, NO RECORDS AT [facility] Proceed with upper endoscopy in the near future with Dr. Oneida Alar. The risks, benefits, and alternatives have been discussed in detail with patient. They have stated understanding and desire to proceed.  Hold Coumadin X 4 days prior *Has pacemaker*

## 2011-09-29 NOTE — Assessment & Plan Note (Addendum)
No real bowel regimen currently. Lower abdominal discomfort likely secondary to chronic constipation. States worsening over past few months, no rectal bleeding. Proceed with stool softeners and Miralax prn, TCS in near future.  UPDATE: No prior TCS AT [facility]  Proceed with colonoscopy with Dr. Oneida Alar in the near future. The risks, benefits, and alternatives have been discussed in detail with the patient. They state understanding and desire to proceed.

## 2011-09-30 ENCOUNTER — Encounter: Payer: Self-pay | Admitting: Gastroenterology

## 2011-09-30 ENCOUNTER — Telehealth: Payer: Self-pay | Admitting: Gastroenterology

## 2011-09-30 DIAGNOSIS — I4891 Unspecified atrial fibrillation: Secondary | ICD-10-CM | POA: Diagnosis not present

## 2011-09-30 NOTE — Telephone Encounter (Signed)
Need to set up for TCS/EGD with Dr. Oneida Alar.  On Coumadin, will need to hold X 4 days prior to procedure. Please double check with primary cardiologist that this is appropriate.

## 2011-09-30 NOTE — Telephone Encounter (Signed)
Also, pt has pacemaker, FYI for procedure.

## 2011-09-30 NOTE — Progress Notes (Signed)
Faxed to PCP

## 2011-10-01 ENCOUNTER — Other Ambulatory Visit: Payer: Self-pay | Admitting: Gastroenterology

## 2011-10-01 ENCOUNTER — Telehealth: Payer: Self-pay | Admitting: *Deleted

## 2011-10-01 DIAGNOSIS — R634 Abnormal weight loss: Secondary | ICD-10-CM

## 2011-10-01 NOTE — Telephone Encounter (Signed)
Patient left message that she needed appointment for atrial fib.  Returned call - voice mail unable to receive messages.

## 2011-10-01 NOTE — Telephone Encounter (Signed)
Faxed request to Dr.Degent at the [facility] office.

## 2011-10-01 NOTE — Progress Notes (Signed)
REVIEWED. AGREE.  PT NEEDS AN MRCP if no CIs.

## 2011-10-01 NOTE — Telephone Encounter (Signed)
Pt is scheduled for 03/05

## 2011-10-01 NOTE — Telephone Encounter (Signed)
LMOVM for pt to call me back

## 2011-10-06 ENCOUNTER — Telehealth: Payer: Self-pay | Admitting: *Deleted

## 2011-10-06 NOTE — Telephone Encounter (Signed)
Nurse called and informed patient that cardiologist didn't manage that medication for her and she needed to call her PCP for this medication. Patient verbalized understanding of plan.

## 2011-10-07 NOTE — Telephone Encounter (Signed)
Called Dr. Arlina Robes office and [facility] for a return call to follow up on this pt.

## 2011-10-07 NOTE — Telephone Encounter (Signed)
Received fax back from Dr. Arlina Robes. OK to hold coumadin 4-5 days for procedure.

## 2011-10-08 ENCOUNTER — Encounter: Payer: Self-pay | Admitting: Gastroenterology

## 2011-10-08 ENCOUNTER — Telehealth: Payer: Self-pay | Admitting: Gastroenterology

## 2011-10-08 NOTE — Telephone Encounter (Signed)
Spoke with patient. Feels like pain she had 2 mo ago when tx for diverticulitis-CT [facility] Haynes Hoehn, NP Pain constant 5/10, goes to right hip. Does not feel like she has fever, but has not checked temperature. Took Colace 2 doses yesterday.  Denies N/V. Last BM yesterday.c/o constipation before that. Questioning whether she can take another dose of Colace today. Has rx at [facility] for hydrocodone for pain that she plans on sending her son to get.  ? Recurrent/persistent diverticulitis, less likely pancreatitis or UTI Recommended UA, CBC, lipase, LFTS today, & I will review CT from [facility], but pt states she can't get to lab today. Advised to go to ER if severe pain Will see Dr. Dannielle Burn tomorrow   Serena Colonel, please get copy of CT report from her head [facility] Thanks

## 2011-10-08 NOTE — Telephone Encounter (Signed)
Patient given appointment for 10/09/11 with Arida.

## 2011-10-08 NOTE — Telephone Encounter (Signed)
Spoke with pt- advised her to stop Coumadin 03/01- instructions mailed

## 2011-10-08 NOTE — Telephone Encounter (Signed)
Reviewed records from [facility]. CT scan abdomen pelvis with IV contrast from 09/06/11 shows no evidence of diverticulitis. She has a stable recurrent ventral hernia status post repair containing nondilated loops of transverse colon and small bowel and stable biliary ductal dilation status post cholecystectomy. Labs reviewed from 09/06/11 INR 2.6, met 7 normal except BUN 23 and glucose 133 Hemoglobin 11.3, otherwise normal CBC Will forward to Daune Perch, NP

## 2011-10-08 NOTE — Telephone Encounter (Signed)
Per Vickey Huger, NP, pt said she would not get to go to do labs today. But if she decides to go, she will call back and I can fax her lab orders to [facility].

## 2011-10-08 NOTE — Telephone Encounter (Signed)
Patient is C/O her stomach burning like a ball of fire/shes using colace but not getting much relief Please advise??

## 2011-10-08 NOTE — Telephone Encounter (Signed)
Records Received.

## 2011-10-08 NOTE — Telephone Encounter (Signed)
Clear liq diet advised

## 2011-10-08 NOTE — Telephone Encounter (Signed)
Called pt. She said she is having an episode of burning in her stomach. She has these occasionally, but doesn't last long. She had just a little BM yesterday. She is only taking the Colace once daily and is not taking the Miralax. I told her that Laban Emperor, NP said she can have 1-2 Colace daily and also Miralax one capful daily. She is not having any N/V.  She is scheduled for TCS and EGD on 10/13/2011. Please advise! ( routing to Vickey Huger, NP since Laban Emperor, NP and Dr. Oneida Alar are out today.

## 2011-10-09 ENCOUNTER — Ambulatory Visit (INDEPENDENT_AMBULATORY_CARE_PROVIDER_SITE_OTHER): Payer: Medicare Other | Admitting: Cardiovascular Disease

## 2011-10-09 ENCOUNTER — Other Ambulatory Visit: Payer: Self-pay | Admitting: Cardiovascular Disease

## 2011-10-09 ENCOUNTER — Encounter: Payer: Self-pay | Admitting: Cardiovascular Disease

## 2011-10-09 DIAGNOSIS — I4891 Unspecified atrial fibrillation: Secondary | ICD-10-CM | POA: Diagnosis not present

## 2011-10-09 DIAGNOSIS — R0602 Shortness of breath: Secondary | ICD-10-CM | POA: Diagnosis not present

## 2011-10-09 DIAGNOSIS — I509 Heart failure, unspecified: Secondary | ICD-10-CM

## 2011-10-09 DIAGNOSIS — Z79899 Other long term (current) drug therapy: Secondary | ICD-10-CM | POA: Diagnosis not present

## 2011-10-09 DIAGNOSIS — Z950 Presence of cardiac pacemaker: Secondary | ICD-10-CM

## 2011-10-09 HISTORY — PX: COLONOSCOPY: SHX174

## 2011-10-09 HISTORY — PX: ESOPHAGOGASTRODUODENOSCOPY: SHX1529

## 2011-10-09 LAB — HM COLONOSCOPY

## 2011-10-09 NOTE — Patient Instructions (Signed)
Follow up as scheduled. Your physician recommends that you continue on your current medications as directed. Please refer to the Current Medication list given to you today. Your physician recommends that you go to the [facility] for lab work: TSH/DIGOXIN LEVEL (Do lab in the morning before taking morning dose of Digoxin.) Your physician has requested that you have an echocardiogram. Echocardiography is a painless test that uses sound waves to create images of your heart. It provides your doctor with information about the size and shape of your heart and how well your heart's chambers and valves are working. This procedure takes approximately one hour. There are no restrictions for this procedure. Your physician has recommended that you wear a holter monitor. Holter monitors are medical devices that record the heart's electrical activity. Doctors most often use these monitors to diagnose arrhythmias. Arrhythmias are problems with the speed or rhythm of the heartbeat. The monitor is a small, portable device. You can wear one while you do your normal daily activities. This is usually used to diagnose what is causing palpitations/syncope (passing out).

## 2011-10-09 NOTE — Progress Notes (Signed)
HPI  This is a 77 year old female who is here today for a followup visit. She has a history of permanent atrial fibrillation. She is status post single chamber pacemaker placement which is being followed by Dr. Rayann Heman. She has a history of anxiety as well. She presents for a followup visit after recent increase in her palpitations. The patient has been experiencing fast heartbeats and tachycardia over the last 4 days which are happening almost a daily basis. They're associated with dyspnea without any chest pain, syncope or presyncope. She has a history of a previous nonischemic cardiomyopathy her most recent ejection fraction was 60%. That has not been evaluated recently.  Allergies  Allergen Reactions  . Tramadol Nausea Only     Current Outpatient Prescriptions on File Prior to Visit  Medication Sig Dispense Refill  . digoxin (LANOXIN) 0.125 MG tablet Take 1 tablet (125 mcg total) by mouth daily.  30 tablet  9  . diltiazem (CARTIA XT) 240 MG 24 hr capsule Take 1 capsule (240 mg total) by mouth daily.  30 capsule  6  . docusate sodium (COLACE) 100 MG capsule Take 1 capsule (100 mg total) by mouth 2 (two) times daily.  60 capsule  1  . Fluticasone-Salmeterol (ADVAIR DISKUS) 250-50 MCG/DOSE AEPB Inhale 1 puff into the lungs every 12 (twelve) hours.        . furosemide (LASIX) 40 MG tablet Take 40 mg by mouth daily.        [name] glyBURIDE (DIABETA) 5 MG tablet Take 2.5 mg by mouth daily with breakfast.       . HYDROcodone-acetaminophen (VICODIN) 5-500 MG per tablet Take by mouth. Takes 1/2 tab as needed       . metoprolol tartrate (LOPRESSOR) 25 MG tablet Take 1 tablet (25 mg total) by mouth 2 (two) times daily.  60 tablet  5  . omeprazole (PRILOSEC) 40 MG capsule Take 40 mg by mouth daily.        . simvastatin (ZOCOR) 10 MG tablet TAKE ONE TABLET BY MOUTH AT BEDTIME  30 tablet  4  . tiotropium (SPIRIVA) 18 MCG inhalation capsule Place 18 mcg into inhaler and inhale daily.        [name] warfarin  (COUMADIN) 2 MG tablet Take 2 mg by mouth daily. Managed by PMD      . zafirlukast (ACCOLATE) 20 MG tablet Take 20 mg by mouth 2 (two) times daily.        [name] ALPRAZolam (XANAX) 0.25 MG tablet May take one tab every 8 hours as needed for severe anxiety  60 tablet  1     Past Medical History  Diagnosis Date  . Permanent atrial fibrillation     Tachybradycardia syndrome on Coumadin anticoagulation  . CAD (coronary artery disease) 2011    Catheterization August 2011 mild nonobstructive coronary artery disease complicated by large left rectus sheath hematoma status post evacuation Coumadin restarted without recurrence  . Diabetes mellitus   . Chronic airway obstruction, not elsewhere classified   . Type II or unspecified type diabetes mellitus without mention of complication, not stated as uncontrolled   . Anemia, unspecified   . Chronic kidney disease, stage II (mild)   . Unspecified hypertensive kidney disease with chronic kidney disease stage I through stage IV, or unspecified   . Tachycardia-bradycardia     s/p PPM by Dr Caryl Comes  . GERD (gastroesophageal reflux disease)   . Abnormal CT of the chest     Mediastinal and hilar adenopathy followed  by Dr. Koleen Nimrod in [place]  . Congestive heart failure, unspecified     EF now 60%, previous nonischemic cardiomyopathy  . Pacemaker     Single chamber [place]. [employer] ACCENT [phone] SN [id number] 04/[birth year]/1959 10/31/2010  . Mitral stenosis     Felt to be moderate by catheterization, but TEE only show mild mitral stenosis. Also no significant mitral regurgitation.  . Hypercholesterolemia   . Diverticulitis Dec 2012     Past Surgical History  Procedure Date  . Single-chamber pacemaker implantation 3/12    by Dr Caryl Comes  . Evacuation of retroperitoneal and left rectus sheath   . Cholecystectomy     40+ years ago  . Hernia repair     X3  . Pilonidal cyst / sinus excision      Family History  Problem Relation Age of Onset  . Cancer Mother     Uterus  .  Cancer Daughter     kidney cancer, in remission  . Heart disease Father   . Colon cancer Neg Hx      History   Social History  . Marital Status: Single    Spouse Name: N/A    Number of Children: 2  . Years of Education: N/A   Occupational History  . RETIRED    Social History Main Topics  . Smoking status: Former Smoker -- 2.0 packs/day for 45 years    Types: Cigarettes    Quit date: 08/10/2006  . Smokeless tobacco: Never Used  . Alcohol Use: No  . Drug Use: No  . Sexually Active: Not on file   Other Topics Concern  . Not on file   Social History Narrative  . No narrative on file     PHYSICAL EXAM   BP 106/68  Pulse 59  Ht 5\' 3"  (1.6 m)  Wt 145 lb (65.772 kg)  BMI 25.69 kg/m2  Constitutional: She is oriented to person, place, and time. She appears well-developed and well-nourished. No distress.  HENT: No nasal discharge.  Head: Normocephalic and atraumatic.  Eyes: Pupils are equal and round. Right eye exhibits no discharge. Left eye exhibits no discharge.  Neck: Normal range of motion. Neck supple. No JVD present. No thyromegaly present.  Cardiovascular: Normal rate, irregular rhythm, normal heart sounds. Exam reveals no gallop and no friction rub. There is a 2/6 systolic ejection murmur at the base. It seems to be a flow murmur.  Pulmonary/Chest: Effort normal and breath sounds normal. No stridor. No respiratory distress. She has no wheezes. She has no rales. She exhibits no tenderness.  Abdominal: Soft. Bowel sounds are normal. She exhibits no distension. There is no tenderness. There is no rebound and no guarding.  Musculoskeletal: Normal range of motion. She exhibits trace edema and no tenderness.  Neurological: She is alert and oriented to person, place, and time. Coordination normal.  Skin: Skin is warm and dry. Chronic stasis dermatitis. She is not diaphoretic. No erythema. No pallor.  Psychiatric: She has a normal mood and affect. Her behavior is normal.  Judgment and thought content normal.      ASSESSMENT AND PLAN

## 2011-10-09 NOTE — Assessment & Plan Note (Signed)
Patient is having increased palpitations and tachycardia over the last few days. She is actually slightly bradycardic during her visit. I recommend a 48-hour Holter monitor for further evaluation. I suspect there is a component of anxiety. The patient requested Xanax. However, she tells me that she is also on Klonopin. I asked her to discuss this with her primary care physician. She did have recent labs which showed a creatinine of 1.34 which is above her baseline. Her hemoglobin was 11.8. I will request a digoxin level given the slight worsening of renal function. I will also obtain a TSH. Due to her palpitations and dyspnea, I will request an echocardiogram as well. She is to followup after current testing.

## 2011-10-09 NOTE — Assessment & Plan Note (Signed)
This is being followed by Dr. Rayann Heman.

## 2011-10-09 NOTE — Assessment & Plan Note (Signed)
Previous nonischemic cardiomyopathy with ejection fraction improved to normal. We'll obtain an echocardiogram due to her worsening symptoms. I will consider decreasing the dose of Lasix to 20 mg once daily due to recent worsening of renal function.

## 2011-10-10 ENCOUNTER — Telehealth: Payer: Self-pay | Admitting: Internal Medicine

## 2011-10-10 NOTE — Telephone Encounter (Signed)
Pt called - did not get laxative for march 4th prep - I told her we would call her first thing in am on the 4th; she's already stopped coumadin; I told  her to hold glyburide march 4th and 5th.

## 2011-10-11 ENCOUNTER — Telehealth: Payer: Self-pay | Admitting: Internal Medicine

## 2011-10-11 NOTE — Telephone Encounter (Signed)
Patient called in again today. States she had a headache and had some nausea and vomiting this morning. Was supposed to begin a clear liquid diet this evening in preparation for colonoscopy  the day after tomorrow. No abdominal pain. I suggested she back off on her diet anyway with her recent acute symptoms. I told her to see how things went over night and to make contact with the office tomorrow morning. Office has been notified previously to make contact first of the week to see what she needs in the way of a colonoscopy prep

## 2011-10-12 ENCOUNTER — Encounter (HOSPITAL_COMMUNITY): Payer: Self-pay | Admitting: Pharmacy Technician

## 2011-10-12 MED ORDER — SODIUM CHLORIDE 0.45 % IV SOLN
Freq: Once | INTRAVENOUS | Status: AC
  Administered 2011-10-13: 10:00:00 via INTRAVENOUS

## 2011-10-12 NOTE — Telephone Encounter (Signed)
CD to take care of pt.

## 2011-10-12 NOTE — Telephone Encounter (Signed)
Spoke with pt- she received her prep and Rx- will proceed with procedure on 03/05

## 2011-10-13 ENCOUNTER — Ambulatory Visit (HOSPITAL_COMMUNITY)
Admission: RE | Admit: 2011-10-13 | Discharge: 2011-10-13 | Disposition: A | Discharge status: Home / Self Care | Payer: Medicare Other | Source: Ambulatory Visit | Attending: Gastroenterology | Admitting: Gastroenterology

## 2011-10-13 ENCOUNTER — Encounter (HOSPITAL_COMMUNITY): Payer: Self-pay | Admitting: *Deleted

## 2011-10-13 ENCOUNTER — Encounter (HOSPITAL_COMMUNITY)
Admission: RE | Disposition: A | Discharge status: Home / Self Care | Payer: Self-pay | Source: Ambulatory Visit | Attending: Gastroenterology

## 2011-10-13 DIAGNOSIS — E119 Type 2 diabetes mellitus without complications: Secondary | ICD-10-CM | POA: Insufficient documentation

## 2011-10-13 DIAGNOSIS — K648 Other hemorrhoids: Secondary | ICD-10-CM | POA: Insufficient documentation

## 2011-10-13 DIAGNOSIS — K259 Gastric ulcer, unspecified as acute or chronic, without hemorrhage or perforation: Secondary | ICD-10-CM | POA: Diagnosis not present

## 2011-10-13 DIAGNOSIS — R634 Abnormal weight loss: Secondary | ICD-10-CM

## 2011-10-13 DIAGNOSIS — Z7901 Long term (current) use of anticoagulants: Secondary | ICD-10-CM | POA: Diagnosis not present

## 2011-10-13 DIAGNOSIS — K5730 Diverticulosis of large intestine without perforation or abscess without bleeding: Secondary | ICD-10-CM | POA: Insufficient documentation

## 2011-10-13 DIAGNOSIS — R198 Other specified symptoms and signs involving the digestive system and abdomen: Secondary | ICD-10-CM | POA: Insufficient documentation

## 2011-10-13 DIAGNOSIS — Z01812 Encounter for preprocedural laboratory examination: Secondary | ICD-10-CM | POA: Insufficient documentation

## 2011-10-13 DIAGNOSIS — K449 Diaphragmatic hernia without obstruction or gangrene: Secondary | ICD-10-CM | POA: Diagnosis not present

## 2011-10-13 DIAGNOSIS — K2980 Duodenitis without bleeding: Secondary | ICD-10-CM | POA: Insufficient documentation

## 2011-10-13 DIAGNOSIS — J4489 Other specified chronic obstructive pulmonary disease: Secondary | ICD-10-CM | POA: Insufficient documentation

## 2011-10-13 DIAGNOSIS — N3090 Cystitis, unspecified without hematuria: Secondary | ICD-10-CM | POA: Diagnosis not present

## 2011-10-13 DIAGNOSIS — J449 Chronic obstructive pulmonary disease, unspecified: Secondary | ICD-10-CM | POA: Diagnosis not present

## 2011-10-13 DIAGNOSIS — D1330 Benign neoplasm of unspecified part of small intestine: Secondary | ICD-10-CM

## 2011-10-13 DIAGNOSIS — K2960 Other gastritis without bleeding: Secondary | ICD-10-CM

## 2011-10-13 DIAGNOSIS — E7800 Pure hypercholesterolemia, unspecified: Secondary | ICD-10-CM | POA: Diagnosis not present

## 2011-10-13 DIAGNOSIS — R109 Unspecified abdominal pain: Secondary | ICD-10-CM | POA: Insufficient documentation

## 2011-10-13 DIAGNOSIS — K2940 Chronic atrophic gastritis without bleeding: Secondary | ICD-10-CM | POA: Insufficient documentation

## 2011-10-13 LAB — GLUCOSE, CAPILLARY

## 2011-10-13 SURGERY — COLONOSCOPY WITH ESOPHAGOGASTRODUODENOSCOPY (EGD)
Anesthesia: Moderate Sedation

## 2011-10-13 MED ORDER — SODIUM CHLORIDE 0.9 % IJ SOLN
INTRAMUSCULAR | Status: AC
  Filled 2011-10-13: qty 10

## 2011-10-13 MED ORDER — PROMETHAZINE HCL 25 MG/ML IJ SOLN
INTRAMUSCULAR | Status: DC | PRN
  Administered 2011-10-13: 12.5 mg via INTRAVENOUS

## 2011-10-13 MED ORDER — MEPERIDINE HCL 100 MG/ML IJ SOLN
INTRAMUSCULAR | Status: AC
  Filled 2011-10-13: qty 2

## 2011-10-13 MED ORDER — MIDAZOLAM HCL 5 MG/5ML IJ SOLN
INTRAMUSCULAR | Status: AC
  Filled 2011-10-13: qty 10

## 2011-10-13 MED ORDER — STERILE WATER FOR IRRIGATION IR SOLN
Status: DC | PRN
  Administered 2011-10-13: 10:00:00

## 2011-10-13 MED ORDER — MEPERIDINE HCL 100 MG/ML IJ SOLN
INTRAMUSCULAR | Status: DC | PRN
  Administered 2011-10-13 (×3): 25 mg via INTRAVENOUS

## 2011-10-13 MED ORDER — PROMETHAZINE HCL 25 MG/ML IJ SOLN
INTRAMUSCULAR | Status: AC
  Filled 2011-10-13: qty 1

## 2011-10-13 MED ORDER — MIDAZOLAM HCL 5 MG/5ML IJ SOLN
INTRAMUSCULAR | Status: DC | PRN
  Administered 2011-10-13: 1 mg via INTRAVENOUS
  Administered 2011-10-13 (×2): 2 mg via INTRAVENOUS

## 2011-10-13 NOTE — Telephone Encounter (Signed)
Noted.  Pt for EGD/TCS today, 3/5.  Does not appear pt completed updated labs as requested.  Please see my office note under HPI with updated CT info.   As per [facility] previously, if no contraindications, will need MRCP after EGD/TCS due to abnormal CT findings. Await report today.

## 2011-10-13 NOTE — H&P (View-Only) (Signed)
Primary Care Physician:  [name], FNP, FNP Primary Gastroenterologist:  Dr. Oneida Alar   Chief Complaint  Patient presents with  . Constipation  . Heartburn    HPI:   [name] is a 77 year old female who presents today at the request of [name] Collum, FNP, secondary to weight loss. Pt reports weighing 165 several months ago. Currently weighs 147. Documented weight of 152 in Jan 2013 at PCP office. Records somewhat scattered at time of visit. Pt reports last EGD/colonoscopy at [facility] several years ago. Unable to retrieve currently. Reports this was normal. Noted possible hx of diverticulitis in Dec 2012 from referring physician, but I do not have CT reports. Pt reports hx of anemia as well.   Reports burning in esophagus. No dysphagia. +epigastric burning, intermittent.  Not aggravated by food. Only eating soup, eggs, half a sandwhich. Decreased po intake, decreased appetite. No N/V.   Lower abdominal discomfort, burning, sometimes better after BM. Bloated. No fever. Wakes up sometimes in am with sweats.   + Constipation, won't have BM unless drink prune juice, takes MOM once a month. No blood in stool. Constipation worsening in last 5-6 mos.    Interesting CT reports in July 2012 of last year with biliary dilation. No HFP on file at that time. Was performed due to ED presentation. Follow-up CT at outside facility [facility] with biliary dilation stable with 2 subsequent scans. CBD 1.77mm at the time.   [facility] CT July 2012 IMPRESSION:  Abnormal intrahepatic biliary dilatation with dilatation of the  common bile duct and common hepatic duct. Common hepatic duct measures 45mm, common bile duct measures 18 mm in diameter. No obvious and obstructing lesion is visible although there is a non-inflamed periampullary duodenal diverticulum.  There is a single enlarged porta hepatis node with short axis  diameter 1.4 cm. This is nonspecific with regard to benign/malignant origin, but  statistically is more likely to be  reactive.  Diverticulosis. Atherosclerosis. Lower lumbar spondylosis and degenerative disc disease.  Suspected mild air trapping in the lung bases.   ADDENDUM: Received records from CT at [facility] 07/2011: descending and sigmoid colon diverticulitis.   [facility] CT Sep 01, 2011: circumferential wall thickening in mid-transverese colon unable to be excluded, could be due to peristalsis. Stable biliary dilatation s/p chole   [facility] CT Sep 06, 2011: CBD 1.6 mm diameter, stable recurrent ventral hernia containing non-dilated loops of transverse colon and small bowel, stable biliary ductal dilation, s/p chole  Past Medical History  Diagnosis Date  . Permanent atrial fibrillation     Tachybradycardia syndrome on Coumadin anticoagulation  . CAD (coronary artery disease) 2011    Catheterization August 2011 mild nonobstructive coronary artery disease complicated by large left rectus sheath hematoma status post evacuation Coumadin restarted without recurrence  . Diabetes mellitus   . Chronic airway obstruction, not elsewhere classified   . Type II or unspecified type diabetes mellitus without mention of complication, not stated as uncontrolled   . Anemia, unspecified   . Chronic kidney disease, stage II (mild)   . Unspecified hypertensive kidney disease with chronic kidney disease stage I through stage IV, or unspecified   . Tachycardia-bradycardia     s/p PPM by Dr [name]  . GERD (gastroesophageal reflux disease)   . Abnormal CT of the chest     Mediastinal and hilar adenopathy followed by Dr. Koleen [name] in [place]  . Congestive heart failure, unspecified     EF now 60%, previous nonischemic cardiomyopathy  . Pacemaker  Single chamber [place]. [name] ACCENT [phone] SN 719 04/11/1957 10/31/2010  . Mitral stenosis     Felt to be moderate by catheterization, but TEE only show mild mitral stenosis. Also no significant mitral regurgitation.  . Hypercholesterolemia   .  Diverticulitis Dec 2012    Past Surgical History  Procedure Date  . Single-chamber pacemaker implantation 3/12    by Dr [name]  . Evacuation of retroperitoneal and left rectus sheath   . Cholecystectomy     40+ years ago  . Hernia repair     X3  . Pilonidal cyst / sinus excision     Current Outpatient Prescriptions  Medication Sig Dispense Refill  . ALPRAZolam (XANAX) 0.25 MG tablet May take one tab every 8 hours as needed for severe anxiety  60 tablet  1  . digoxin (LANOXIN) 0.125 MG tablet Take 1 tablet (125 mcg total) by mouth daily.  30 tablet  9  . diltiazem (CARTIA XT) 240 MG 24 hr capsule Take 1 capsule (240 mg total) by mouth daily.  30 capsule  6  . Fluticasone-Salmeterol (ADVAIR DISKUS) 250-50 MCG/DOSE AEPB Inhale 1 puff into the lungs every 12 (twelve) hours.        . furosemide (LASIX) 40 MG tablet Take 40 mg by mouth daily.        [name] glyBURIDE (DIABETA) 5 MG tablet Take 2.5 mg by mouth daily with breakfast.       . HYDROcodone-acetaminophen (VICODIN) 5-500 MG per tablet Take by mouth. Takes 1/2 tab as needed       . metoprolol tartrate (LOPRESSOR) 25 MG tablet Take 1 tablet (25 mg total) by mouth 2 (two) times daily.  60 tablet  5  . omeprazole (PRILOSEC) 40 MG capsule Take 40 mg by mouth daily.        . simvastatin (ZOCOR) 10 MG tablet TAKE ONE TABLET BY MOUTH AT BEDTIME  30 tablet  4  . tiotropium (SPIRIVA) 18 MCG inhalation capsule Place 18 mcg into inhaler and inhale daily.        [name] warfarin (COUMADIN) 2 MG tablet Take 2 mg by mouth daily. Managed by PMD      . zafirlukast (ACCOLATE) 20 MG tablet Take 20 mg by mouth 2 (two) times daily.        [name] docusate sodium (COLACE) 100 MG capsule Take 1 capsule (100 mg total) by mouth 2 (two) times daily.  60 capsule  1  . polyethylene glycol powder (GLYCOLAX/MIRALAX) powder Take 17 g by mouth daily. As needed for a bowel movement.  255 g  1    Allergies as of 09/28/2011 - Review Complete 09/28/2011  Allergen Reaction Noted  .  Tramadol Nausea Only 12/03/2010    Family History  Problem Relation Age of Onset  . Cancer Mother     Uterus  . Cancer Daughter     kidney cancer, in remission  . Heart disease Father   . Colon cancer Neg Hx     History   Social History  . Marital Status: Single    Spouse Name: N/A    Number of Children: 2  . Years of Education: N/A   Occupational History  . RETIRED    Social History Main Topics  . Smoking status: Former Smoker -- 2.0 packs/day for 45 years    Types: Cigarettes    Quit date: 08/10/2006  . Smokeless tobacco: Never Used  . Alcohol Use: No  . Drug Use: No  . Sexually Active:  Not on file   Other Topics Concern  . Not on file   Social History Narrative  . No narrative on file    Review of Systems: Gen: SEE HPI CV: Denies chest pain, heart palpitations, peripheral edema, syncope.  Resp: Denies shortness of breath at rest or with exertion. Denies wheezing or cough.  GI: SEE HPI GU : Denies urinary burning, urinary frequency, urinary hesitancy MS: Denies joint pain, muscle weakness, cramps, or limitation of movement.  Derm: Denies rash, itching, dry skin Psych: Denies depression, anxiety, memory loss, and confusion Heme: Denies bruising, bleeding, and enlarged lymph nodes.  Physical Exam: BP 100/50  Pulse 60  Temp(Src) 98.2 F (36.8 C) (Temporal)  Ht 5\' 3"  (1.6 m)  Wt 147 lb 6.4 oz (66.86 kg)  BMI 26.11 kg/m2 General:   Alert and oriented. Pleasant and cooperative. Well-nourished and well-developed.  Head:  Normocephalic and atraumatic. Eyes:  Without icterus, sclera clear and conjunctiva pink.  Ears:  Normal auditory acuity. Nose:  No deformity, discharge,  or lesions. Mouth:  No dentition Neck:  Supple, without mass or thyromegaly. Lungs:  Mild expiratory wheeze bilaterally Heart:  S1, S2 present  Abdomen:  +BS, soft, mildly TTP epigastric/LUQ, LLQ/lower abdomen, non-distended. No HSM noted. No guarding or rebound. No masses appreciated.    Rectal:  Deferred  Msk:  Symmetrical without gross deformities. Normal posture. Extremities:  Without clubbing or edema. Neurologic:  Alert and  oriented x4;  grossly normal neurologically. Skin:  Intact without significant lesions or rashes. Cervical Nodes:  No significant cervical adenopathy. Psych:  Alert and cooperative. Normal mood and affect.

## 2011-10-13 NOTE — Discharge Instructions (Signed)
You have internal hemorrhoids & diverticulosis. You have GASTRITIS, WHICH CAN CAUSE UPPER LEFT SIDED ABDOMINAL PAIN. YOU HAVE A DUODENAL DIVERTICULUM & NODULE.  I biopsied your stomach & DUODENUM.  CONTINUE OMEPRAZOLE 40 MG EVERY MORNING. AVOID ITEMS THAT TRIGGER GASTRITIS. RESTART COUMADIN ON Oct 17, 2011. FOLLOW A HIGH FIBER/LOW FAT DIET. AVOID ITEMS THAT CAUSE BLOATING. SEE INFO BELOW. YOUR BIOPSY RESULTS WILL BE BACK IN 7 DAYS. USE COLACE TWICE DAILY. USE MIRALAX ONCE DAILY.   ENDOSCOPY Care After Read the instructions outlined below and refer to this sheet in the next week. These discharge instructions provide you with general information on caring for yourself after you leave the hospital. While your treatment has been planned according to the most current medical practices available, unavoidable complications occasionally occur. If you have any problems or questions after discharge, call DR. Calen Geister, [phone].  ACTIVITY  You may resume your regular activity, but move at a slower pace for the next 24 hours.   Take frequent rest periods for the next 24 hours.   Walking will help get rid of the air and reduce the bloated feeling in your belly (abdomen).   No driving for 24 hours (because of the medicine (anesthesia) used during the test).   You may shower.   Do not sign any important legal documents or operate any machinery for 24 hours (because of the anesthesia used during the test).    NUTRITION  Drink plenty of fluids.   You may resume your normal diet as instructed by your doctor.   Begin with a light meal and progress to your normal diet. Heavy or fried foods are harder to digest and may make you feel sick to your stomach (nauseated).   Avoid alcoholic beverages for 24 hours or as instructed.    MEDICATIONS  You may resume your normal medications.   WHAT YOU CAN EXPECT TODAY  Some feelings of bloating in the abdomen.   Passage of more gas than usual.    Spotting of blood in your stool or on the toilet paper  .  IF YOU HAD POLYPS REMOVED DURING THE ENDOSCOPY:  Eat a soft diet IF YOU HAVE NAUSEA, BLOATING, ABDOMINAL PAIN, OR VOMITING.    FINDING OUT THE RESULTS OF YOUR TEST Not all test results are available during your visit. DR. Oneida Alar WILL CALL YOU WITHIN 7 DAYS OF YOUR PROCEDUE WITH YOUR RESULTS. Do not assume everything is normal if you have not heard from DR. Maryon Kemnitz IN ONE WEEK, CALL HER OFFICE AT [phone].  SEEK IMMEDIATE MEDICAL ATTENTION AND CALL THE OFFICE: [phone] IF:  You have more than a spotting of blood in your stool.   Your belly is swollen (abdominal distention).   You are nauseated or vomiting.   You have a temperature over 101F.   You have abdominal pain or discomfort that is severe or gets worse throughout the day.   Gastritis  Gastritis is an inflammation (the body's way of reacting to injury and/or infection) of the stomach. It is often caused by viral or bacterial (germ) infections. It can also be caused BY ASPIRIN, BC/GOODY POWDER'S, (IBUPROFEN) MOTRIN, OR ALEVE (NAPROXEN), chemicals (including alcohol), SPICY FOODS, and medications. This illness may be associated with generalized malaise (feeling tired, not well), UPPER ABDOMINAL STOMACH cramps, and fever. One common bacterial cause of gastritis is an organism known as H. Pylori. This can be treated with antibiotics.    High-Fiber Diet A high-fiber diet changes your normal diet to include more  whole grains, legumes, fruits, and vegetables. Changes in the diet involve replacing refined carbohydrates with unrefined foods. The calorie level of the diet is essentially unchanged. The Dietary Reference Intake (recommended amount) for adult males is 38 grams per day. For adult females, it is 25 grams per day. Pregnant and lactating women should consume 28 grams of fiber per day. Fiber is the intact part of a plant that is not broken down during digestion.  Functional fiber is fiber that has been isolated from the plant to provide a beneficial effect in the body. PURPOSE  Increase stool bulk.   Ease and regulate bowel movements.   Lower cholesterol.  INDICATIONS THAT YOU NEED MORE FIBER  Constipation and hemorrhoids.   Uncomplicated diverticulosis (intestine condition) and irritable bowel syndrome.   Weight management.   As a protective measure against hardening of the arteries (atherosclerosis), diabetes, and cancer.   GUIDELINES FOR INCREASING FIBER IN THE DIET  Start adding fiber to the diet slowly. A gradual increase of about 5 more grams (2 slices of whole-wheat bread, 2 servings of most fruits or vegetables, or 1 bowl of high-fiber cereal) per day is best. Too rapid an increase in fiber may result in constipation, flatulence, and bloating.   Drink enough water and fluids to keep your urine clear or pale yellow. Water, juice, or caffeine-free drinks are recommended. Not drinking enough fluid may cause constipation.   Eat a variety of high-fiber foods rather than one type of fiber.   Try to increase your intake of fiber through using high-fiber foods rather than fiber pills or supplements that contain small amounts of fiber.   The goal is to change the types of food eaten. Do not supplement your present diet with high-fiber foods, but replace foods in your present diet.  INCLUDE A VARIETY OF FIBER SOURCES  Replace refined and processed grains with whole grains, canned fruits with fresh fruits, and incorporate other fiber sources. White rice, white breads, and most bakery goods contain little or no fiber.   Brown whole-grain rice, buckwheat oats, and many fruits and vegetables are all good sources of fiber. These include: broccoli, Brussels sprouts, cabbage, cauliflower, beets, sweet potatoes, white potatoes (skin on), carrots, tomatoes, eggplant, squash, berries, fresh fruits, and dried fruits.   Cereals appear to be the richest  source of fiber. Cereal fiber is found in whole grains and bran. Bran is the fiber-rich outer coat of cereal grain, which is largely removed in refining. In whole-grain cereals, the bran remains. In breakfast cereals, the largest amount of fiber is found in those with "bran" in their names. The fiber content is sometimes indicated on the label.   You may need to include additional fruits and vegetables each day.   In baking, for 1 cup white flour, you may use the following substitutions:   1 cup whole-wheat flour minus 2 tablespoons.   1/2 cup white flour plus 1/2 cup whole-wheat flour.   Low-Fat Diet BREADS, CEREALS, PASTA, RICE, DRIED PEAS, AND BEANS These products are high in carbohydrates and most are low in fat. Therefore, they can be increased in the diet as substitutes for fatty foods. They too, however, contain calories and should not be eaten in excess. Cereals can be eaten for snacks as well as for breakfast.  Include foods that contain fiber (fruits, vegetables, whole grains, and legumes). Research shows that fiber may lower blood cholesterol levels, especially the water-soluble fiber found in fruits, vegetables, oat products, and legumes. FRUITS AND  VEGETABLES It is good to eat fruits and vegetables. Besides being sources of fiber, both are rich in vitamins and some minerals. They help you get the daily allowances of these nutrients. Fruits and vegetables can be used for snacks and desserts. MEATS Limit lean meat, chicken, [place], and fish to no more than 6 ounces per day. Beef, Pork, and Lamb Use lean cuts of beef, pork, and lamb. Lean cuts include:  Extra-lean ground beef.  Arm roast.  Sirloin tip.  Center-cut ham.  Round steak.  Loin chops.  Rump roast.  Tenderloin.  Trim all fat off the outside of meats before cooking. It is not necessary to severely decrease the intake of red meat, but lean choices should be made. Lean meat is rich in protein and contains a highly  absorbable form of iron. Premenopausal women, in particular, should avoid reducing lean red meat because this could increase the risk for low red blood cells (iron-deficiency anemia). The organ meats, such as liver, sweetbreads, kidneys, and brain are very rich in cholesterol. They should be limited. Chicken and [place] These are good sources of protein. The fat of poultry can be reduced by removing the skin and underlying fat layers before cooking. Chicken and [place] can be substituted for lean red meat in the diet. Poultry should not be fried or covered with high-fat sauces. Fish and Shellfish Fish is a good source of protein. Shellfish contain cholesterol, but they usually are low in saturated fatty acids. The preparation of fish is important. Like chicken and [place], they should not be fried or covered with high-fat sauces. EGGS Egg whites contain no fat or cholesterol. They can be eaten often. Try 1 to 2 egg whites instead of whole eggs in recipes or use egg substitutes that do not contain yolk. MILK AND DAIRY PRODUCTS Use skim or 1% milk instead of 2% or whole milk. Decrease whole milk, natural, and processed cheeses. Use nonfat or low-fat (2%) cottage cheese or low-fat cheeses made from vegetable oils. Choose nonfat or low-fat (1 to 2%) yogurt. Experiment with evaporated skim milk in recipes that call for heavy cream. Substitute low-fat yogurt or low-fat cottage cheese for sour cream in dips and salad dressings. Have at least 2 servings of low-fat dairy products, such as 2 glasses of skim (or 1%) milk each day to help get your daily calcium intake.  FATS AND OILS Reduce the total intake of fats, especially saturated fat. Butterfat, lard, and beef fats are high in saturated fat and cholesterol. These should be avoided as much as possible. Vegetable fats do not contain cholesterol, but certain vegetable fats, such as coconut oil, palm oil, and palm kernel oil are very high in saturated fats. These  should be limited. These fats are often used in bakery goods, processed foods, popcorn, oils, and nondairy creamers. Vegetable shortenings and some peanut butters contain hydrogenated oils, which are also saturated fats. Read the labels on these foods and check for saturated vegetable oils. Unsaturated vegetable oils and fats do not raise blood cholesterol. However, they should be limited because they are fats and are high in calories. Total fat should still be limited to 30% of your daily caloric intake. Desirable liquid vegetable oils are corn oil, cottonseed oil, olive oil, canola oil, safflower oil, soybean oil, and sunflower oil. Peanut oil is not as good, but small amounts are acceptable. Buy a heart-healthy tub margarine that has no partially hydrogenated oils in the ingredients. Mayonnaise and salad dressings often are made  from unsaturated fats, but they should also be limited because of their high calorie and fat content. Seeds, nuts, peanut butter, olives, and avocados are high in fat, but the fat is mainly the unsaturated type. These foods should be limited mainly to avoid excess calories and fat. OTHER EATING TIPS Snacks  Most sweets should be limited as snacks. They tend to be rich in calories and fats, and their caloric content outweighs their nutritional value. Some good choices in snacks are graham crackers, melba toast, soda crackers, bagels (no egg), English muffins, fruits, and vegetables. These snacks are preferable to snack crackers, [place] fries, and chips. Popcorn should be air-popped or cooked in small amounts of liquid vegetable oil. Desserts Eat fruit, low-fat yogurt, and fruit ices. AVOID pastries, cake, and cookies. Sherbet, angel food cake, gelatin dessert, frozen low-fat yogurt, or other frozen products that do not contain saturated fat (pure fruit juice bars, frozen ice pops) are also acceptable.  COOKING METHODS Choose those methods that use little or no fat. They  include: Poaching.  Braising.  Steaming.  Grilling.  Baking.  Stir-frying.  Broiling.  Microwaving.  Foods can be cooked in a nonstick pan without added fat, or use a nonfat cooking spray in regular cookware. Limit fried foods and avoid frying in saturated fat. Add moisture to lean meats by using water, broth, cooking wines, and other nonfat or low-fat sauces along with the cooking methods mentioned above. Soups and stews should be chilled after cooking. The fat that forms on top after a few hours in the refrigerator should be skimmed off. When preparing meals, avoid using excess salt. Salt can contribute to raising blood pressure in some people. EATING AWAY FROM HOME Order entres, potatoes, and vegetables without sauces or butter. When meat exceeds the size of a deck of cards (3 to 4 ounces), the rest can be taken home for another meal. Choose vegetable or fruit salads and ask for low-calorie salad dressings to be served on the side. Use dressings sparingly. Limit high-fat toppings, such as bacon, crumbled eggs, cheese, sunflower seeds, and olives. Ask for heart-healthy tub margarine instead of butter.   Diverticulosis Diverticulosis is a common condition that develops when small pouches (diverticula) form in the wall of the colon. The risk of diverticulosis increases with age. It happens more often in people who eat a low-fiber diet. Most individuals with diverticulosis have no symptoms. Those individuals with symptoms usually experience belly (abdominal) pain, constipation, or loose stools (diarrhea).  HOME CARE INSTRUCTIONS  Increase the amount of fiber in your diet as directed by your caregiver or dietician. This may reduce symptoms of diverticulosis.   Drink at least 6 to 8 glasses of water each day to prevent constipation.   Try not to strain when you have a bowel movement.   Avoiding nuts and seeds to prevent complications is still an uncertain benefit.   FOODS HAVING HIGH FIBER  CONTENT INCLUDE:  Fruits. Apple, peach, pear, tangerine, raisins, prunes.   Vegetables. Brussels sprouts, asparagus, broccoli, cabbage, carrot, cauliflower, romaine lettuce, spinach, summer squash, tomato, winter squash, zucchini.   Starchy Vegetables. Baked beans, kidney beans, lima beans, split peas, lentils, potatoes (with skin).   Grains. Whole wheat bread, brown rice, bran flake cereal, plain oatmeal, white rice, shredded wheat, bran muffins.   SEEK IMMEDIATE MEDICAL CARE IF:  You develop increasing pain or severe bloating.   You have an oral temperature above 101F.   You develop vomiting or bowel movements that are bloody  or black.    Hemorrhoids Hemorrhoids are dilated (enlarged) veins around the rectum. Sometimes clots will form in the veins. This makes them swollen and painful. These are called thrombosed hemorrhoids. Causes of hemorrhoids include:  Constipation.   Straining to have a bowel movement.   HEAVY LIFTING HOME CARE INSTRUCTIONS  Eat a well balanced diet and drink 6 to 8 glasses of water every day to avoid constipation. You may also use a bulk laxative.   Avoid straining to have bowel movements.   Keep anal area dry and clean.   Do not use a donut shaped pillow or sit on the toilet for long periods. This increases blood pooling and pain.   Move your bowels when your body has the urge; this will require less straining and will decrease pain and pressure.

## 2011-10-13 NOTE — Interval H&P Note (Signed)
History and Physical Interval Note:  10/13/2011 9:56 AM  [name]  has presented today for surgery, with the diagnosis of wt loss and constipation  The various methods of treatment have been discussed with the patient and family. After consideration of risks, benefits and other options for treatment, the patient has consented to  Procedure(s) (LRB): COLONOSCOPY WITH ESOPHAGOGASTRODUODENOSCOPY (EGD) (N/A) as a surgical intervention .  The patients' history has been reviewed, patient examined, no change in status, stable for surgery.  I have reviewed the patients' chart and labs.  Questions were answered to the patient's satisfaction.     [employer]

## 2011-10-13 NOTE — Progress Notes (Signed)
Quick Note:  Pt for EGD/TCS 3/5 (today).  HFP normal.  ______

## 2011-10-13 NOTE — Op Note (Signed)
[facility] [address] [place], [place]  [address]  DR. FIELD'S TCS EGD PROCEDURE REPORT  PATIENT:  Hyun, Randt  MR#:  [id number] BIRTHDATE:  11/26/1934, 76 yrs. old  GENDER:  female  ENDOSCOPIST:  Barney Drain, MD REF. BY:  Breck Coons, N.P. ASSISTANT:  PROCEDURE DATE:  10/13/2011 PROCEDURE:  EGD with biopsy, ILEOColonoscopy [id number]  INDICATIONS:  CHANGE IN BOWEL HABITS BURNING ABDOMINAL PAIN ? Webb 2013 ON CT [place] DILATED CBD-NL HFP  MEDICATIONS:   Demerol 75 mg IV, Versed 5 mg IV, promethazine (Phenergan) 12.5 mg IV  DESCRIPTION OF PROCEDURE:    Physical exam was performed. Informed consent was obtained from the patient after explaining the benefits, risks, and alternatives to procedure.  The patient was connected to monitor and placed in left lateral position. Continuous oxygen was provided by nasal cannula and IV medicine administered through an indwelling cannula.  After administration of sedation, the patient's esophagus was intubated and the [id number] [id number]), [id number]) and [id number]) endoscope was advanced under direct visualization to the second portion of the duodenum.  The scope was removed slowly by carefully examining the color, texture, anatomy, and integrity of the mucosa on the way out.  After administration of sedation and rectal exam, the patient's rectum was intubated and the [id number]), [id number]) and [id number]) colonoscope was advanced under direct visualization to the ILEUM. The scope was removed slowly by carefully examining the color, texture, anatomy, and integrity mucosa on the way out.  The patient was recovered in endoscopy and discharged home in satisfactory condition. <<PROCEDUREIMAGES>>  FINDINGS:  Severe diverticulosis WITH THICKENED FOLDS was found in the sigmoid to descending colon segments.  MODERATE Internal Hemorrhoids were found. NL  ILEUM  5 CM VISUALIZED. EROSIVE GASTRITIS FOUND & BIOPSIED VIA CLD FORCEPS. SMALLHIATAL HENIA/ DUODENAL NODULE REMOVED VIA COLD FORCEPS. ONE LARGE MOUTH DUODENAL DIVERTICULUM.  PREP QUALITY: EXCELLENT CECAL W/D TIME:    12 minutes  COMPLICATIONS:    None  ENDOSCOPIC IMPRESSION: 1) Severe diverticulosis in the sigmoid to descending colon segments 2) Internal hemorrhoids 3) HIATAL HERNIA 4) EROSIVE GASTRITIS 5) DUODENAL DIVERTICULUM 6) DUODENAL NODULE  RECOMMENDATIONS: LOW FAT/HIGH FIBER DIET COLACE BID MIRALAX QD AWAIT BIOPSIES OMP 40 MG DAILY OPV IN 3 MOS PT NEEDS MRCP FOR DILATED CBD  REPEAT EXAM:  No  ______________________________ Barney Drain, MD  CC:  Breck Coons, N.P.  n. eSIGNED:   Aizah Gehlhausen at 10/13/2011 01:56 PM  [name], [id number]

## 2011-10-13 NOTE — Telephone Encounter (Signed)
REVIEWED.  

## 2011-10-14 ENCOUNTER — Telehealth: Payer: Self-pay | Admitting: Gastroenterology

## 2011-10-14 DIAGNOSIS — I4891 Unspecified atrial fibrillation: Secondary | ICD-10-CM | POA: Diagnosis not present

## 2011-10-14 DIAGNOSIS — I2789 Other specified pulmonary heart diseases: Secondary | ICD-10-CM | POA: Diagnosis not present

## 2011-10-14 DIAGNOSIS — I080 Rheumatic disorders of both mitral and aortic valves: Secondary | ICD-10-CM | POA: Diagnosis not present

## 2011-10-14 DIAGNOSIS — I079 Rheumatic tricuspid valve disease, unspecified: Secondary | ICD-10-CM | POA: Diagnosis not present

## 2011-10-14 DIAGNOSIS — R0602 Shortness of breath: Secondary | ICD-10-CM | POA: Diagnosis not present

## 2011-10-14 NOTE — Telephone Encounter (Signed)
IF PAIN CONTINUES PT NEEDS HBT FOR SIBO.

## 2011-10-14 NOTE — Telephone Encounter (Signed)
Please call pt. HER stomach & DUODENAL Bx shows gastritis & DUODENITIS. Continue OMEPRAZOLE 30 MINUTES PRIOR TO YOUR FIRST MEAL. AVOID GASTRIC IRRITANTS. OPV IN 3 MOS.

## 2011-10-15 NOTE — Telephone Encounter (Signed)
REVIEWED.  

## 2011-10-15 NOTE — Telephone Encounter (Signed)
Results Cc to PCP & f/u opv is in the computer

## 2011-10-15 NOTE — Telephone Encounter (Signed)
Informed pt. She said she is much better, has some occasional pain, but much improved. She will call if pain continues.

## 2011-10-16 ENCOUNTER — Telehealth: Payer: Self-pay | Admitting: Gastroenterology

## 2011-10-16 NOTE — Telephone Encounter (Signed)
Please call pt. She has a erosive gastritis. She needs to avoid gastric irritants. See the HO she received after her procedure. If she is having burning abdominal pain, she should follow a clear liquid diet for the next 24 hours. Use Maalox 30 ml every 6 hours for the next 24 hours. Continue omeprazole. OPV next week e 30 visit for abdominal pain.

## 2011-10-16 NOTE — Telephone Encounter (Signed)
OV with Neil Crouch, PA on 10/19/2011.

## 2011-10-16 NOTE — Telephone Encounter (Signed)
Pt called- she is having a lot of burning and pain in her stomach- please call her @ [phone]

## 2011-10-16 NOTE — Telephone Encounter (Signed)
Pt was informed.

## 2011-10-16 NOTE — Telephone Encounter (Signed)
REVIEWED.  

## 2011-10-16 NOTE — Telephone Encounter (Signed)
Called pt. She said she ate cream of chicken soup and crackers for supper last night. Then she went to bed. She woke up in the middle of the night with her stomach burning. Burning sensation in ribs and chest also. She has a lot of gas that she cannot get rid of . Some abdominal pain. Please advise!

## 2011-10-18 DIAGNOSIS — Z7901 Long term (current) use of anticoagulants: Secondary | ICD-10-CM | POA: Diagnosis not present

## 2011-10-18 DIAGNOSIS — Z79899 Other long term (current) drug therapy: Secondary | ICD-10-CM | POA: Diagnosis not present

## 2011-10-18 DIAGNOSIS — E119 Type 2 diabetes mellitus without complications: Secondary | ICD-10-CM | POA: Diagnosis not present

## 2011-10-18 DIAGNOSIS — J441 Chronic obstructive pulmonary disease with (acute) exacerbation: Secondary | ICD-10-CM | POA: Diagnosis not present

## 2011-10-18 DIAGNOSIS — R079 Chest pain, unspecified: Secondary | ICD-10-CM | POA: Diagnosis not present

## 2011-10-18 DIAGNOSIS — R05 Cough: Secondary | ICD-10-CM | POA: Diagnosis not present

## 2011-10-18 DIAGNOSIS — Z950 Presence of cardiac pacemaker: Secondary | ICD-10-CM | POA: Diagnosis not present

## 2011-10-18 DIAGNOSIS — R5381 Other malaise: Secondary | ICD-10-CM | POA: Diagnosis not present

## 2011-10-18 DIAGNOSIS — R0989 Other specified symptoms and signs involving the circulatory and respiratory systems: Secondary | ICD-10-CM | POA: Diagnosis not present

## 2011-10-19 ENCOUNTER — Encounter: Payer: Self-pay | Admitting: Gastroenterology

## 2011-10-19 ENCOUNTER — Ambulatory Visit (INDEPENDENT_AMBULATORY_CARE_PROVIDER_SITE_OTHER): Payer: Medicare Other | Admitting: Gastroenterology

## 2011-10-19 VITALS — BP 89/57 | HR 61 | Temp 98.2°F | Ht 63.0 in | Wt 144.8 lb

## 2011-10-19 DIAGNOSIS — K3189 Other diseases of stomach and duodenum: Secondary | ICD-10-CM

## 2011-10-19 DIAGNOSIS — K838 Other specified diseases of biliary tract: Secondary | ICD-10-CM | POA: Insufficient documentation

## 2011-10-19 DIAGNOSIS — K439 Ventral hernia without obstruction or gangrene: Secondary | ICD-10-CM | POA: Insufficient documentation

## 2011-10-19 DIAGNOSIS — K5900 Constipation, unspecified: Secondary | ICD-10-CM

## 2011-10-19 DIAGNOSIS — R935 Abnormal findings on diagnostic imaging of other abdominal regions, including retroperitoneum: Secondary | ICD-10-CM | POA: Diagnosis not present

## 2011-10-19 DIAGNOSIS — R1013 Epigastric pain: Secondary | ICD-10-CM

## 2011-10-19 DIAGNOSIS — R634 Abnormal weight loss: Secondary | ICD-10-CM | POA: Diagnosis not present

## 2011-10-19 DIAGNOSIS — R109 Unspecified abdominal pain: Secondary | ICD-10-CM

## 2011-10-19 HISTORY — DX: Other specified diseases of biliary tract: K83.8

## 2011-10-19 MED ORDER — ALIGN 4 MG PO CAPS: 4.0000 mg | ORAL_CAPSULE | Freq: Every day | ORAL | Status: DC

## 2011-10-19 MED ORDER — OMEPRAZOLE MAGNESIUM 20 MG PO TBEC: DELAYED_RELEASE_TABLET | ORAL | Status: DC

## 2011-10-19 NOTE — Patient Instructions (Signed)
Take Prilosec OTC 30 minutes before your evening meal for the next two weeks only. We have given you samples. Take Align one daily for three weeks. We have given you samples.  I will look at your CT and discuss next step with Dr. Oneida Alar.  Gastritis Gastritis is an irritation of the stomach. It can be caused by anything that bothers the stomach. Some irritants include:  Alcohol.   Caffeine.   Nicotine.   Spicy, acidic, greasy, and fried foods.   Medicines for pain and arthritis.   Emotional distress.  HOME CARE   Only take medicine as told by your doctor.   Take small sips of clear liquids often. Do not drink large amounts of liquids at one time.   If you have watery poop (diarrhea), avoid milk, fruit, tobacco, alcohol, really hot or cold liquids, or too much of anything at one time.   Rest.   Wash your hands often.   Once you can keep clear liquids down, start soups, juices, apple sauce, crackers, and sherbet. Slowly add plain, not spicy, foods to your diet.  GET HELP RIGHT AWAY IF:   You cannot keep fluids down.   You cannot stop throwing up (vomiting) or you throw up blood.   You have more stomach or chest pain.   You have a temperature by mouth above 102 F (38.9 C), not controlled by medicine.   You pass out (faint), feel lightheaded, or are more thirsty than normal.   You have bloody or black poop (stools).   Your watery poop will not go away.   You are not improving or are getting worse.  MAKE SURE YOU:   Understand these instructions.   Will watch your condition.   Will get help right away if you are not doing well or get worse.  Document Released: 01/13/2008 Document Revised: 07/16/2011 Document Reviewed: 06/14/2009 [facility] Patient Information 2012 [place].  How to Increase Fiber in the Meal Plan for Diabetes Increasing fiber in the diet has many benefits including lowering blood cholesterol, helping to control blood glucose (sugar),  preventing constipation, and aiding in weight management by helping you feel full longer. Start adding fiber to your diet slowly. A gradual substitution of high fiber foods for low fiber foods will allow the digestive tract to adjust. Most men under 17 years of age should aim to eat 38 g of fiber a day. Women should aim for 25 g. Over 24 years of age, most men need 30 g of fiber and most women need 21 g. Below are some suggestions for increasing fiber.  Try whole-wheat bread instead of white bread. Look for words high on the list of ingredients such as whole wheat, whole rye, or whole oats.   Try baked potato with skin instead of mashed potatoes.   Try a fresh apple with skin instead of applesauce.   Try a fresh orange instead of orange juice.   Try popcorn instead of potato chips.   Try bran cereal instead of corn flakes.   Try kidney, whole pinto, or garbanzo beans instead of bread.   Try whole-grain crackers instead of saltine crackers.   Try whole-wheat pasta instead of regular varieties.   While on a high fiber diet, drink enough water and fluids to keep your urine clear or pale yellow.   Eat a variety of high fiber foods such as fruits, vegetables, whole grains, nuts, and seeds.   Try to increase your intake of fiber by eating  high fiber foods instead of taking fiber pills or supplements that contain small amounts of fiber. There can be additional benefits for long-term health and blood glucose control with high fiber foods. Aim for 5 servings of fruit and vegetables per day.  SOURCES OF FIBER The following list shows the average dietary fiber for types of food in the various food groups. Starch and Bread / Dietary Fiber (g)  Whole-grain breads, 1 slice / 2 g   Whole grain,  cup / 2 g   Whole-grain cereals,  cup / 3 g   Bran cereals, ? to  cup / 8 g   Starchy vegetables,  cup / 3 g   Legumes (beans, peas, lentils),  cup / 8 g   Oatmeal,  cup / 2 g   Whole-wheat  pasta, ? cup / 2 g   Brown rice,  cup / 2 g   Barley,  cup / 3 g  Meat and Meat Substitutes / Dietary Fiber (g) This group averages 0 grams of fiber. Exceptions are:  Nuts, seeds, 1 oz or  cup / 3 g   Chunky peanut butter, 2 tbs / 3 g  Vegetables / Dietary Fiber (g)  Cooked vegetables,  to  cup / 2 to 3 g   Raw vegetables, 1 to 2 cups / 2 to 3 g  Fruit / Dietary Fiber (g)  Raw or cooked fruit,  cup or 1 small, fresh piece / 2 g  Milk / Dietary Fiber (g)  Milk, 1 cup or 8 oz / 0 g  Fats and Oils / Dietary Fiber (g)  Fats and oils, 1 tsp / 0 g  You can determine how much fiber you are eating by reading the Nutrition Facts panel on the labels of the foods you eat. FIBER IN SPECIFIC FOODS Cereals / Dietary Fiber (g)  All Bran, ? cup / 9 g   All Bran with Extra Fiber,  cup / 13 g   Bran Flakes,  cup / 4 g   Cheerios,  cup / 1.5 g   Corn Bran,  cup / 4 g   Corn Flakes,  cup / 0.75 g   Cracklin' Oat Bran,  cup / 4 g   Fiber One,  cup / 13 g   Grape Nuts, 3 tbs / 3 g   Grape Nuts Flakes,  cup / 3 g   Noodles,  cup, cooked / 0.5 g   Nutrigrain Wheat,  cup / 3.5 g   Oatmeal,  cup, cooked / 1.1 g   Pasta, white (macaroni, spaghetti),  cup, cooked / 0.5 g   Pasta, whole-wheat (macaroni, spaghetti),  cup, cooked / 2 g   [place],  cup, cooked / 3 g   Rice, wild, ? cup, cooked / 0.5 g   Rice, brown,  cup, cooked / 1 g   Rice, white,  cup, cooked / 0.2 g   Shredded Wheat, bite-sized,  cup / 2 g   Total,  cup / 1.75 g   Wheat Chex,  cup / 2.5 g   [place],  cup, cooked / 4 g   Wheaties,  cup / 2.75 g  Bread, Starchy Vegetables, and Dried Peas and Beans / Dietary Fiber (g)  Bagel, whole / 0.6 g   Baked beans in tomato sauce,  cup, cooked / 3 g   Bran muffin, 1 small / 2.5 g   Bread, cracked wheat, 1 slice / 2.5 g   Bread,  pumpernickel, 1 slice / 2.5 g   Bread, white, 1 slice / 0.4 g   Bread,  whole-wheat, 1 slice / 1.4 g   Corn,  cup, canned / 2.9 g   Kidney beans, ? cup, cooked / 3.5 g   Lentils, ? cup, cooked / 3 g   Lima beans,  cup, cooked / 4 g   Navy beans, ? cup, cooked / 4 g   Peas,  cup, cooked / 4 g   Popcorn, 3 cups popped, unbuttered / 3.5 g   Potato, baked (with skin), 1 small / 4 g   Potato, baked (without skin), 1 small / 2 g   Ry-Krisp, 4 crackers / 3 g   Saltine crackers, 6 squares / 0 g   Split peas, ? cup, cooked / 2.5 g   Yams (sweet potato), ? cup / 1.7 g  Fruit / Dietary Fiber (g)  Apple, 1 small, fresh, with skin / 4 g   Apple juice,  cup / 0.4 g   Apricots, 4 medium, fresh / 4 g   Apricots, 7 halves, dried / 2 g   Banana,  medium / 1.2 g   Blueberries,  cup / 2 g   Cantaloupe, ? melon / 1.3 g   Cherries,  cup, canned / 1.4 g   Grapefruit,  medium / 1.6 g   Grapes, 15 small / 1.2 g   Grape juice, ? cup / 0.5 g   Orange, 1 medium, fresh / 2 g   Orange juice,  cup / 0.5 g   Peach, 1 medium,fresh, with skin / 2 g   Pear, 1 medium, fresh, with skin / 4 g   Pineapple, ? cup, canned / 0.7 g   Plums, 2 whole / 2 g   Prunes, 3 whole / 1.5 g   Raspberries, 1 cup / 6 g   Strawberries, 1  cup / 4 g   Watermelon, 1  cup / 0.5 g  Vegetables / Dietary Fiber (g)  Asparagus,  cup, cooked / 1 g   Beans, green and wax,  cup, cooked / 1.6 g   Beets,  cup, cooked / 1.8 g   Broccoli,  cup, cooked / 2.2 g   Brussels sprouts,  cup, cooked / 4 g   Cabbage,  cup, cooked / 2.5 g   Carrots,  cup, cooked / 2.3 g   Cauliflower,  cup, cooked / 1.1 g   Celery, 1 cup, raw / 1.5 g   Cucumber, 1 cup, raw / 0.8 g   Green pepper,  cup sliced, cooked / 1.5 g   Lettuce, 1 cup, sliced / 0.9 g   Mushrooms, 1 cup sliced, raw / 1.8 g   Onion, 1 cup sliced, raw / 1.6 g   Spinach,  cup, cooked / 2.4 g   Tomato, 1 medium, fresh / 1.5 g   Tomato juice,  cup / 0 g   Zucchini,  cup, cooked / 1.8 g    Document Released: 01/16/2002 Document Revised: 07/16/2011 Document Reviewed: 02/12/2009 [facility] Patient Information 2012 [place], [place].

## 2011-10-19 NOTE — Progress Notes (Signed)
Primary Care Physician: [name], FNP, FNP  Primary Gastroenterologist:  Barney Drain, MD   Chief Complaint  Patient presents with  . Follow-up    stomach still hurts some    HPI: [name] is a 77 y.o. female here for followup of recent EGD and colonoscopy. She had severe sigmoid and descending colon diverticulosis, hiatal hernia, erosive gastritis without H. Pylori, peptic duodenitis, no celiac disease, duodenal diverticulum.  [facility] emergency room yesterday, no PNA but COPD exacerbation. She was given prednisone, plans to get it filled today. Continues to have postprandial mid abdominal burning. No discomfort if she does not eat.  No n/v. BM just watery, stomach rolling and gurgling. Non-bloody. Omeprazole 40mg  daily. Sometimes hard to go to sleep with the abdominal pain. Tried the Maalox but doesn't help. No BM everyday.   Cannot have MRI due to pacemaker. GB out in 1970s.   Weight 165lb several months ago. 152lb 08/2011. 144.8 pounds today.   Current Outpatient Prescriptions  Medication Sig Dispense Refill  . acetaminophen (TYLENOL) 325 MG tablet Take 650 mg by mouth every 6 (six) hours as needed. For pain      . clonazePAM (KLONOPIN) 0.5 MG tablet Take 1 tablet by mouth Three times daily as needed. For anxiety      . digoxin (LANOXIN) 0.125 MG tablet Take 1 tablet (125 mcg total) by mouth daily.  30 tablet  9  . diltiazem (CARTIA XT) 240 MG 24 hr capsule Take 1 capsule (240 mg total) by mouth daily.  30 capsule  6  . docusate sodium (COLACE) 100 MG capsule Take 100 mg by mouth 2 (two) times daily as needed. For constipation      . Fluticasone-Salmeterol (ADVAIR DISKUS) 250-50 MCG/DOSE AEPB Inhale 1 puff into the lungs every 12 (twelve) hours.        . furosemide (LASIX) 40 MG tablet Take 40 mg by mouth daily.        [name] glyBURIDE (DIABETA) 5 MG tablet Take 2.5 mg by mouth daily with breakfast.       . HYDROcodone-acetaminophen (NORCO) 5-325 MG per tablet Take 1 tablet  by mouth every 6 (six) hours as needed. For pain      . metoprolol tartrate (LOPRESSOR) 25 MG tablet Take 1 tablet (25 mg total) by mouth 2 (two) times daily.  60 tablet  5  . omeprazole (PRILOSEC) 40 MG capsule Take 40 mg by mouth daily.        [name] OVER THE COUNTER MEDICATION Place 1 patch onto the skin as needed. "Pain patch from [place]" only uses when needed for hip pain      . polyethylene glycol (MIRALAX / GLYCOLAX) packet Take 17 g by mouth daily as needed. For constipation      . PROAIR HFA 108 (90 BASE) MCG/ACT inhaler Take 2 puffs by mouth Every 6 hours as needed. For shortness of breath      . simvastatin (ZOCOR) 10 MG tablet       . tiotropium (SPIRIVA) 18 MCG inhalation capsule Place 18 mcg into inhaler and inhale daily.        . zafirlukast (ACCOLATE) 20 MG tablet Take 20 mg by mouth 2 (two) times daily.          Allergies as of 10/19/2011 - Review Complete 10/19/2011  Allergen Reaction Noted  . Tramadol Nausea Only 12/03/2010    ROS:  General: Negative for anorexia,fever, chills, fatigue, weakness.see history of present illness for weight loss ENT: Negative  for hoarseness, difficulty swallowing , nasal congestion. CV: Negative for chest pain, angina, palpitations, dyspnea on exertion, peripheral edema.  Respiratory: Negative for dyspnea at rest, dyspnea on exertion.positive for cough, sputum, wheezing. See history of present illness GI: See history of present illness. GU:  Negative for dysuria, hematuria, urinary incontinence, urinary frequency, nocturnal urination.  Endo: see history of present illness    Physical Examination:   BP 89/57  Pulse 61  Temp(Src) 98.2 F (36.8 C) (Temporal)  Ht 5\' 3"  (1.6 m)  Wt 144 lb 12.8 oz (65.681 kg)  BMI 25.65 kg/m2  General: Well-nourished, well-developed in no acute distress. elderly Eyes: No icterus. Mouth: Oropharyngeal mucosa moist and pink , no lesions erythema or exudate. Lungs: Clear to auscultation bilaterally.  Heart:  Regular rate and rhythm, no murmurs rubs or gallops.  Abdomen: Bowel sounds are normal, nontender, nondistended, no hepatosplenomegaly or masses, no abdominal bruits, no rebound or guarding. Large ventral weakness above the umbilicus and just left of midline, nontender, size of a lemon.  Extremities: No lower extremity edema. No clubbing or deformities. Neuro: Alert and oriented x 4   Skin: Warm and dry, no jaundice.   Psych: Alert and cooperative, normal mood and affect.  Labs:  Lab Results  Component Value Date   WBC 9.7 09/28/2011   HGB 11.3* 09/28/2011   HCT 37.1 09/28/2011   MCV 99.5 09/28/2011   PLT 229 09/28/2011   Lab Results  Component Value Date   CREATININE 1.34* 09/28/2011   BUN 34* 09/28/2011   NA 145 09/28/2011   K 4.2 09/28/2011   CL 107 09/28/2011   CO2 29 09/28/2011   Lab Results  Component Value Date   ALT 10 09/28/2011   AST 20 09/28/2011   ALKPHOS 85 09/28/2011   BILITOT 0.5 09/28/2011    Imaging Studies: Several recent CTs at [facility].  CT in 09/2011 was noncontrast: "stable sequela of ventral abdominal wall hernia repair with persistent ventral herniation containing a nondilated portion of transverse colon and several loops of small bowel, extensive atherosclerotic calcifications". CBD dilated to 1.6cm.  CT 02/2011 [facility]  CBD24mm, common hepatic duct 24mm.

## 2011-10-20 ENCOUNTER — Telehealth: Payer: Self-pay | Admitting: *Deleted

## 2011-10-20 ENCOUNTER — Encounter: Payer: Self-pay | Admitting: Gastroenterology

## 2011-10-20 NOTE — Assessment & Plan Note (Signed)
See abd pain.  

## 2011-10-20 NOTE — Telephone Encounter (Signed)
Pt notified of results and verbalized understanding. She has not had lab work done at this time. She will do that this week. Pt states he is having problems with her COPD and it's causing her heart to run away. Pt notified she needs to have labs done ASAP. Pt verbalized understanding.

## 2011-10-20 NOTE — Telephone Encounter (Signed)
Message copied by Marcille Buffy on Tue Oct 20, 2011  9:42 AM ------      Message from: Kathlyn [place] A      Created: Mon Oct 19, 2011  4:51 PM       Inform patient that echo showed normal heart function. Did she have her labs done?

## 2011-10-20 NOTE — Assessment & Plan Note (Signed)
Doing well on current regimen. High fiber diet. If too numerous stools, may take Miralax every other day or only on days without BM.

## 2011-10-20 NOTE — Assessment & Plan Note (Signed)
Postprandial abdominal burning pain with associated weight loss. Patient without pain as long as she does not eat. Recent EGD showed erosive gastritis and peptic duodenitis. Continues to consume diet soft drinks but trying to stick with Sprite Zero. Avoiding food irritants otherwise. Also was noted biliary dilation on several studies. Status post cholecystectomy more than 30 years ago. Patient has a pacemaker therefore likely cannot undergo MRCP but we will verify with radiology. Also need to keep in mind the possibility of mesenteric ischemia given postprandial component, weight loss, noted extensive atherosclerotic calcifications on prior CTs.  Again will discuss prior CTs with radiology and determine if she can have an MRCP. For now will increase her Prilosec to 40 mg in the morning and 20 mg in the evening. #14 Prilosec OTC samples provided. She will do this for 2 weeks. Handout for avoiding gastric irritants diet provided. Will also add Align one daily for three weeks. Samples provided. High fiber diet given for constipation/diverticulosis. Further recommendations to follow.

## 2011-10-20 NOTE — Progress Notes (Signed)
Faxed to PCP

## 2011-10-21 ENCOUNTER — Encounter: Payer: Self-pay | Admitting: Gastroenterology

## 2011-10-21 ENCOUNTER — Ambulatory Visit (INDEPENDENT_AMBULATORY_CARE_PROVIDER_SITE_OTHER): Payer: Medicare Other | Admitting: Gastroenterology

## 2011-10-21 ENCOUNTER — Telehealth: Payer: Self-pay

## 2011-10-21 ENCOUNTER — Telehealth: Payer: Self-pay | Admitting: Internal Medicine

## 2011-10-21 VITALS — BP 118/70 | HR 60 | Temp 97.9°F | Ht 63.0 in | Wt 141.2 lb

## 2011-10-21 DIAGNOSIS — R300 Dysuria: Secondary | ICD-10-CM

## 2011-10-21 DIAGNOSIS — R109 Unspecified abdominal pain: Secondary | ICD-10-CM

## 2011-10-21 DIAGNOSIS — R634 Abnormal weight loss: Secondary | ICD-10-CM | POA: Diagnosis not present

## 2011-10-21 DIAGNOSIS — K838 Other specified diseases of biliary tract: Secondary | ICD-10-CM

## 2011-10-21 LAB — CBC WITH DIFFERENTIAL/PLATELET
Eosinophils Relative: 0 % (ref 0–5)
HCT: 37.2 % (ref 36.0–46.0)
Hemoglobin: 11.9 g/dL — ABNORMAL LOW (ref 12.0–15.0)
Lymphocytes Relative: 24 % (ref 12–46)
Lymphs Abs: 3.4 10*3/uL (ref 0.7–4.0)
MCV: 96.6 fL (ref 78.0–100.0)
Monocytes Absolute: 1.2 10*3/uL — ABNORMAL HIGH (ref 0.1–1.0)
Monocytes Relative: 9 % (ref 3–12)
RBC: 3.85 MIL/uL — ABNORMAL LOW (ref 3.87–5.11)
RDW: 13.3 % (ref 11.5–15.5)
WBC: 13.8 10*3/uL — ABNORMAL HIGH (ref 4.0–10.5)

## 2011-10-21 LAB — COMPREHENSIVE METABOLIC PANEL
ALT: 15 U/L (ref 0–35)
AST: 21 U/L (ref 0–37)
Albumin: 4.4 g/dL (ref 3.5–5.2)
Alkaline Phosphatase: 83 U/L (ref 39–117)
BUN: 31 mg/dL — ABNORMAL HIGH (ref 6–23)
CO2: 29 mEq/L (ref 19–32)
Calcium: 10.9 mg/dL — ABNORMAL HIGH (ref 8.4–10.5)
Chloride: 99 mEq/L (ref 96–112)
Creat: 1.07 mg/dL (ref 0.50–1.10)
Glucose, Bld: 88 mg/dL (ref 70–99)
Potassium: 4 mEq/L (ref 3.5–5.3)
Sodium: 139 mEq/L (ref 135–145)
Total Bilirubin: 0.4 mg/dL (ref 0.3–1.2)
Total Protein: 7.4 g/dL (ref 6.0–8.3)

## 2011-10-21 LAB — LIPASE: Lipase: 46 U/L (ref 0–75)

## 2011-10-21 MED ORDER — SUCRALFATE 1 GM/10ML PO SUSP: 1.0000 g | Freq: Four times a day (QID) | ORAL | Status: DC

## 2011-10-21 NOTE — Telephone Encounter (Signed)
Patient seen this morning in office. See OV note. Discussed prior contrast CTs with Dr. Rolm Baptise. Mesenteric vasculature looks good and nothing to be gained by CTA.   Patient complains of only mid-abd burning and belching. No odynophagia/dysphagia. This message from [facility] not seen prior to patient leaving the office. We are make referral to [facility]. Labs and u/a to be done today. I will discuss Dexilant and Elavil with patient when labs reviewed.

## 2011-10-21 NOTE — Telephone Encounter (Signed)
Patient called me at 00:30 hours and 0445 this morning; patient complaining of severe retrosternal chest and lower abdominal pain. States she could hardly stand it any longer. Vague symptoms of dysphagia and odynophagia. Seen in the office earlier this week. Recently started on prednisone. Prilosec not effective. Recent EGD "gastritis". I recommended go to the ED or would arrange expedited office visit here today. For the record, she called the after hours on the evening of February 28 with similar symptoms and I recommended office visit (not previously documented).  Discuss with LSL.

## 2011-10-21 NOTE — Progress Notes (Signed)
Primary Care Physician: [name], FNP, FNP  Primary Gastroenterologist:  Barney Drain, MD   Chief Complaint  Patient presents with  . Heartburn    HPI: [name] is a 77 y.o. female here for urgent office visit. I saw in the office 2 days ago for the same symptoms. She has been complaining of midabdominal burning with meals and weight loss for couple months now. See office note from 10/19/2011 for further details. At that time I added an additional dose of Prilosec 20 mg at bedtime. Recommended that she avoid gastric irritants. Added probiotic. I also reviewed her CT scans with Dr. Rolm Baptise, radiologist. He looked back at her contrasted CT study in July of last year. Although she has significant atherosclerotic disease of the abdominal aorta her mesenteric vasculature looks wide open. She also has chronically dilated bile duct with normal LFTs. Because of her pacemaker she is not a candidate for MRI, confirmed this with Dr. Ardeen Garland as well.  Patient called and left a voicemail several times during the night as well as contacted Dr. Gala Romney on call last night.   Started prednisone 10mg  tid. Started Monday evening. Taking for five days only. Burning in stomach, belching. Layed down at 5pm, slept one hour. Burning stomach before supper. 1/2 cup cauliflower, brocolli, carrots. Piece of toast. Stomach kept burning, Bottle of water. Burned throughout night. Went to bed 10pm could not sleep. 2:30 maalox helped just little bit. Then started burning really bad. No odynophagia or dysphagia. No fever. Complains a lot of belching. States the pain just will not settle down. Typically he was postprandial only before. Denies vomiting. Complains of some dysuria.  Current Outpatient Prescriptions  Medication Sig Dispense Refill  . acetaminophen (TYLENOL) 325 MG tablet Take 650 mg by mouth every 6 (six) hours as needed. For pain      . clonazePAM (KLONOPIN) 0.5 MG tablet Take 1 tablet by mouth  Three times daily as needed. For anxiety      . digoxin (LANOXIN) 0.125 MG tablet Take 1 tablet (125 mcg total) by mouth daily.  30 tablet  9  . diltiazem (CARTIA XT) 240 MG 24 hr capsule Take 1 capsule (240 mg total) by mouth daily.  30 capsule  6  . docusate sodium (COLACE) 100 MG capsule Take 100 mg by mouth 2 (two) times daily as needed. For constipation      . Fluticasone-Salmeterol (ADVAIR DISKUS) 250-50 MCG/DOSE AEPB Inhale 1 puff into the lungs every 12 (twelve) hours.        . furosemide (LASIX) 40 MG tablet Take 40 mg by mouth daily.        [name] glyBURIDE (DIABETA) 5 MG tablet Take 2.5 mg by mouth daily with breakfast.       . HYDROcodone-acetaminophen (NORCO) 5-325 MG per tablet Take 1 tablet by mouth every 6 (six) hours as needed. For pain      . metoprolol tartrate (LOPRESSOR) 25 MG tablet Take 1 tablet (25 mg total) by mouth 2 (two) times daily.  60 tablet  5  . omeprazole (PRILOSEC OTC) 20 MG tablet Take one 30 minutes before supper for the next two weeks only.  14 tablet  0  . omeprazole (PRILOSEC) 40 MG capsule Take 40 mg by mouth daily.        [name] OVER THE COUNTER MEDICATION Place 1 patch onto the skin as needed. "Pain patch from [place]" only uses when needed for hip pain      . polyethylene  glycol (MIRALAX / GLYCOLAX) packet Take 17 g by mouth daily as needed. For constipation      . PROAIR HFA 108 (90 BASE) MCG/ACT inhaler Take 2 puffs by mouth Every 6 hours as needed. For shortness of breath      . Probiotic Product (ALIGN) 4 MG CAPS Take 4 mg by mouth daily.  21 capsule  0  . simvastatin (ZOCOR) 10 MG tablet       . tiotropium (SPIRIVA) 18 MCG inhalation capsule Place 18 mcg into inhaler and inhale daily.        . zafirlukast (ACCOLATE) 20 MG tablet Take 20 mg by mouth 2 (two) times daily.          Allergies as of 10/21/2011 - Review Complete 10/21/2011  Allergen Reaction Noted  . Tramadol Nausea Only 12/03/2010   Past Medical History  Diagnosis Date  . Permanent atrial  fibrillation     Tachybradycardia syndrome on Coumadin anticoagulation  . CAD (coronary artery disease) 2011    Catheterization August 2011 mild nonobstructive coronary artery disease complicated by large left rectus sheath hematoma status post evacuation Coumadin restarted without recurrence  . Diabetes mellitus   . Chronic airway obstruction, not elsewhere classified   . Type II or unspecified type diabetes mellitus without mention of complication, not stated as uncontrolled   . Anemia, unspecified   . Chronic kidney disease, stage II (mild)   . Unspecified hypertensive kidney disease with chronic kidney disease stage I through stage IV, or unspecified   . Tachycardia-bradycardia     s/p PPM by Dr Caryl Comes  . GERD (gastroesophageal reflux disease)   . Abnormal CT of the chest     Mediastinal and hilar adenopathy followed by Dr. Koleen Nimrod in [place]  . Congestive heart failure, unspecified     EF now 60%, previous nonischemic cardiomyopathy  . Pacemaker     Single chamber [place]. [employer] [phone] SN 719 04/[birth year]/1959 10/31/2010  . Mitral stenosis     Felt to be moderate by catheterization, but TEE only show mild mitral stenosis. Also no significant mitral regurgitation.  . Hypercholesterolemia   . Diverticulitis Dec 2012   Past Surgical History  Procedure Date  . Single-chamber pacemaker implantation 3/12    by Dr Caryl Comes  . Evacuation of retroperitoneal and left rectus sheath   . Cholecystectomy     40+ years ago  . Hernia repair     X3  . Pilonidal cyst / sinus excision   . Esophagogastroduodenoscopy 10/2011    erosive gastritis, biopsy with no H. pylori, hiatal hernia, peptic duodenitis, no celiac disease, duodenal diverticulum  . Colonoscopy 10/2011    severe sigmoid and descending diverticulosis, internal hemorrhoids     ROS:  General:see history of present illness ENT: Negative for hoarseness, difficulty swallowing , nasal congestion. CV: Negative for chest pain, angina,  palpitations, dyspnea on exertion, peripheral edema.  Respiratory: Negative for dyspnea at rest, dyspnea on exertion, cough, sputum, wheezing.  GI: See history of present illness. GU:  Positive for dysuria. No hematuria, urinary incontinence, urinary frequency, nocturnal urination.  Endo: see history of present illness   Physical Examination:   BP 118/70  Pulse 60  Temp(Src) 97.9 F (36.6 C) (Temporal)  Ht 5\' 3"  (1.6 m)  Wt 141 lb 3.2 oz (64.048 kg)  BMI 25.01 kg/m2  General: Well-nourished, well-developed in no acute distress.  Eyes: No icterus. Mouth: Oropharyngeal mucosa moist and pink , no lesions erythema or exudate. Lungs: Clear to auscultation  bilaterally.  Heart: Regular rate and rhythm, no murmurs rubs or gallops.  Abdomen: Bowel sounds are normal, minimal diffuse tenderness, nondistended, no hepatosplenomegaly or masses, no abdominal bruits, no rebound or guarding. Ventral weakness.   Extremities: No lower extremity edema. No clubbing or deformities. Neuro: Alert and oriented x 4   Skin: Warm and dry, no jaundice.   Psych: Alert and cooperative, normal mood and affect.

## 2011-10-21 NOTE — Telephone Encounter (Signed)
Pt had called here and left message at 11:30 pm last night and at 6:03 Am this morning. I returned call when I got here. She said she has been up all night with her stomach and chest burning like it is on fire. She said she just cannot stand this any longer. She wants Dr. Oneida Alar to admit her to the hospital. Michela Pitcher she is weak and shaky. Her BS was 125 when she took it this morning. Said she talked to Dr. Gala Romney at 5:00 pm this morning and he told her to take a Prilosec and some Maalox. She did, and it did not help. Please advise!

## 2011-10-21 NOTE — Progress Notes (Signed)
REVIEWED.  HBT FOR SIBO. AGREE WITH LABS & [place].

## 2011-10-21 NOTE — Progress Notes (Signed)
Faxed to PCP

## 2011-10-21 NOTE — Telephone Encounter (Signed)
PUT ON DEXILANT. AGREE WITH EVAL FOR MESENTERIC ISCHEMIA. NEED HBT FOR SIBO. REFER TO [facility] FOR 2ND OPINION. START ELAVIL 10 MG QHS FOR 5 DAYS THEN 2 PO QHS FOR 5 DAYS THEN 3 PO QHS FOR NON-ULCER DYSPEPSIA.

## 2011-10-21 NOTE — Telephone Encounter (Signed)
Pt has appt to see Neil Crouch, PA at 10:30 AM this morning.

## 2011-10-21 NOTE — Assessment & Plan Note (Addendum)
Abdominal burning more significant during the night. Previously postprandial. Lots of indigestion. Last bowel movement 2 days ago. No vomiting. She has been avoiding gastric irritants. Taking Prilosec 40 mg in the morning 20 mg in the evening. Just recently started prednisone 36 hours ago. I do not get a history of significant odynophagia/dysphagia as previously reported to [place]. I have reviewed her CTs with Dr. Rolm Baptise today. No findings suggestive of mesenteric ischemia and he does not feel that CTA we'll add anything at this point. Also the patient is not a candidate for MRI given her pacemaker therefore MRCP cannot be done.  Given the worsening of her symptoms as well as dysuria we will check some stat labs today and urinalysis. As previously discussed with Dr. Oneida Alar, arrange for second opinion at [facility] for abdominal pain.   Recommendations came after patient left office. Per Dr. Oneida Alar, recommends change PPI to Dexilant and add Elavil. To discuss with patient once labs received.

## 2011-10-21 NOTE — Telephone Encounter (Signed)
REVIEWED.  

## 2011-10-22 ENCOUNTER — Ambulatory Visit: Payer: Medicare Other | Admitting: Physician Assistant

## 2011-10-22 ENCOUNTER — Telehealth: Payer: Self-pay | Admitting: Gastroenterology

## 2011-10-22 ENCOUNTER — Encounter: Payer: Self-pay | Admitting: Gastroenterology

## 2011-10-22 ENCOUNTER — Other Ambulatory Visit: Payer: Self-pay | Admitting: Gastroenterology

## 2011-10-22 LAB — URINALYSIS W MICROSCOPIC + REFLEX CULTURE
Bilirubin Urine: NEGATIVE
Crystals: NONE SEEN
Glucose, UA: NEGATIVE mg/dL
Specific Gravity, Urine: 1.012 (ref 1.005–1.030)
pH: 7 (ref 5.0–8.0)

## 2011-10-22 MED ORDER — METRONIDAZOLE 500 MG PO TABS: 500.0000 mg | ORAL_TABLET | Freq: Three times a day (TID) | ORAL | Status: AC

## 2011-10-22 MED ORDER — CIPROFLOXACIN HCL 500 MG PO TABS: 500.0000 mg | ORAL_TABLET | Freq: Two times a day (BID) | ORAL | Status: AC

## 2011-10-22 NOTE — Progress Notes (Signed)
I informed pt of the prescriptions. She only takes 1/2 of 5mg  Glyburide daily now. She was informed to hold  that. ( Pt is concerned because she is on Prednisone now also.)  Said she is feeling much better since she started the Carafate yesterday. No burning in her stomach now,.and no abdominal pain. She will hold the Zocor. Pt is on Coumadin 1mg  daily and she takes it at bedtime.

## 2011-10-22 NOTE — Progress Notes (Signed)
Pt informed

## 2011-10-22 NOTE — Progress Notes (Signed)
Quick Note:  Please see encounter from today regarding instructions for medications while on abx. ______

## 2011-10-22 NOTE — Telephone Encounter (Signed)
Pt is scheduled to see Dr Roney Mans @ [facility] on 04/01 @ 8- Letter mailed

## 2011-10-22 NOTE — Progress Notes (Signed)
Glad she is feeling better. Please ask her to take coumadin 1/2 tablet (0.5mg ) daily while on antibiotics.  Thanks.

## 2011-10-22 NOTE — Progress Notes (Signed)
Quick Note:  Pt informed of results and the new prescriptions. ( Also informed of the instructions on the 10/22/2011 orders encounter of Neil Crouch, PA.) [name] she is feeling much better since she started the Carafate yesterday. No stomach burning and no abdominal pain. ______

## 2011-10-22 NOTE — Progress Notes (Signed)
Cipro and Flagyl sent to pharmacy.  Please ask patient to hold her glyburide while on cipro. If she notes her sugars are high she can take 1/2 pill.   Please hold cholesterol med (zocor) while on antibiotics.  She will need to reduce her coumadin dose while on antibiotics. I don't see coumadin on drug list from yesterday. Please find out what dose she is on. Will give further instructions at that point.

## 2011-10-22 NOTE — Progress Notes (Signed)
Quick Note:  Please let patient know, urinalysis positive for infection and WBC up. I suspect all up due to UTI but she is high risk for diverticulitis as well.  I have sent in RX for Flagyl and Cipro to her pharmacy. This should cover both but if urine culture comes back with resistance we may need to adjust antibiotics.   How is patient doing? ______

## 2011-10-26 ENCOUNTER — Encounter: Payer: Self-pay | Admitting: Cardiovascular Disease

## 2011-10-26 ENCOUNTER — Ambulatory Visit (INDEPENDENT_AMBULATORY_CARE_PROVIDER_SITE_OTHER): Payer: Medicare Other | Admitting: Cardiovascular Disease

## 2011-10-26 VITALS — BP 107/67 | HR 60 | Ht 63.0 in | Wt 139.0 lb

## 2011-10-26 DIAGNOSIS — Z950 Presence of cardiac pacemaker: Secondary | ICD-10-CM | POA: Diagnosis not present

## 2011-10-26 DIAGNOSIS — R0602 Shortness of breath: Secondary | ICD-10-CM | POA: Diagnosis not present

## 2011-10-26 DIAGNOSIS — Z79899 Other long term (current) drug therapy: Secondary | ICD-10-CM | POA: Diagnosis not present

## 2011-10-26 DIAGNOSIS — I4891 Unspecified atrial fibrillation: Secondary | ICD-10-CM

## 2011-10-26 NOTE — Patient Instructions (Signed)
Your physician wants you to follow-up in: 6 months. You will receive a reminder letter in the mail one-two months in advance. If you don't receive a letter, please call our office to schedule the follow-up appointment. Your physician recommends that you continue on your current medications as directed. Please refer to the Current Medication list given to you today. 

## 2011-10-26 NOTE — Assessment & Plan Note (Signed)
This is being followed by Dr. Rayann Heman.

## 2011-10-26 NOTE — Assessment & Plan Note (Signed)
Patient is having increased palpitations and tachycardia.  This seems to be a chronic problem .I suspect there is a component of anxiety.   her Holter monitor showed only few episodes of rapid ventricular response but for the most part her ventricular rate was reasonably controlled. She is already on diltiazem, metoprolol and Digoxin.  I don't recommend adding another medication or increasing the dose. Continue current medical therapy.

## 2011-10-26 NOTE — Progress Notes (Signed)
HPI   this is a 77 year old female who is here today for a followup visit. She was seen recently for palpitations and dyspnea. She has known history of chronic atrial fibrillation as well as tachybradycardia syndrome status post pacemaker placement. She has a history of cardiomyopathy but ejection fraction went back to normal. She had a Holter monitor done which showed atrial fibrillation with only few episodes of rapid ventricular response and a maximum heart rate of 120 beats per minute. She did have occasional PVCs and ventricular bigeminy. She had an echocardiogram done which showed normal LV systolic function with moderate mitral regurgitation , mild aortic stenosis and mild tricuspid regurgitation. She had multiple medical issues recently including 2 urinary tract infection as well as abdominal pain. EGD showed evidence of gastritis. She continues to complain of palpitations especially at night. There has been no dizziness, syncope or presyncope.  Allergies  Allergen Reactions  . Tramadol Nausea Only     Current Outpatient Prescriptions on File Prior to Visit  Medication Sig Dispense Refill  . acetaminophen (TYLENOL) 325 MG tablet Take 650 mg by mouth every 6 (six) hours as needed. For pain       . ciprofloxacin (CIPRO) 500 MG tablet Take 1 tablet (500 mg total) by mouth 2 (two) times daily.  20 tablet  0  . clonazePAM (KLONOPIN) 0.5 MG tablet Take 1 tablet by mouth Three times daily as needed. For anxiety      . diltiazem (CARTIA XT) 240 MG 24 hr capsule Take 1 capsule (240 mg total) by mouth daily.  30 capsule  6  . docusate sodium (COLACE) 100 MG capsule Take 100 mg by mouth 2 (two) times daily as needed. For constipation       . Fluticasone-Salmeterol (ADVAIR DISKUS) 250-50 MCG/DOSE AEPB Inhale 1 puff into the lungs every 12 (twelve) hours.        . furosemide (LASIX) 40 MG tablet Take 40 mg by mouth daily.        [name] HYDROcodone-acetaminophen (NORCO) 5-325 MG per tablet Take 1 tablet  by mouth every 6 (six) hours as needed. For pain       . metoprolol tartrate (LOPRESSOR) 25 MG tablet Take 1 tablet (25 mg total) by mouth 2 (two) times daily.  60 tablet  5  . metroNIDAZOLE (FLAGYL) 500 MG tablet Take 1 tablet (500 mg total) by mouth 3 (three) times daily.  30 tablet  0  . omeprazole (PRILOSEC OTC) 20 MG tablet Take one 30 minutes before supper for the next two weeks only.  14 tablet  0  . omeprazole (PRILOSEC) 40 MG capsule Take 40 mg by mouth daily.        [name] OVER THE COUNTER MEDICATION Place 1 patch onto the skin as needed. "Pain patch from [place]" only uses when needed for hip pain       . polyethylene glycol (MIRALAX / GLYCOLAX) packet Take 17 g by mouth daily as needed. For constipation      . predniSONE (DELTASONE) 10 MG tablet Take 10 mg by mouth 3 (three) times daily. For five days.       [name] PROAIR HFA 108 (90 BASE) MCG/ACT inhaler Take 2 puffs by mouth Every 6 hours as needed. For shortness of breath      . Probiotic Product (ALIGN) 4 MG CAPS Take 4 mg by mouth daily.  21 capsule  0  . sucralfate (CARAFATE) 1 GM/10ML suspension Take 10 mLs (1 g total)  by mouth 4 (four) times daily. Take before meals and at bedtime for 10 days.  420 mL  0  . tiotropium (SPIRIVA) 18 MCG inhalation capsule Place 18 mcg into inhaler and inhale daily.        . zafirlukast (ACCOLATE) 20 MG tablet Take 20 mg by mouth 2 (two) times daily.        . digoxin (LANOXIN) 0.125 MG tablet Take 1 tablet (125 mcg total) by mouth daily.  30 tablet  9  . glyBURIDE (DIABETA) 5 MG tablet Take 2.5 mg by mouth daily with breakfast.       . simvastatin (ZOCOR) 10 MG tablet Take 10 mg by mouth at bedtime.       [name] warfarin (COUMADIN) 1 MG tablet Take 1 tablet (1 mg total) by mouth daily.         Past Medical History  Diagnosis Date  . Permanent atrial fibrillation     Tachybradycardia syndrome on Coumadin anticoagulation  . CAD (coronary artery disease) 2011    Catheterization August 2011 mild  nonobstructive coronary artery disease complicated by large left rectus sheath hematoma status post evacuation Coumadin restarted without recurrence  . Diabetes mellitus   . Chronic airway obstruction, not elsewhere classified   . Type II or unspecified type diabetes mellitus without mention of complication, not stated as uncontrolled   . Anemia, unspecified   . Chronic kidney disease, stage II (mild)   . Unspecified hypertensive kidney disease with chronic kidney disease stage I through stage IV, or unspecified   . Tachycardia-bradycardia     s/p PPM by Dr Caryl Comes  . GERD (gastroesophageal reflux disease)   . Abnormal CT of the chest     Mediastinal and hilar adenopathy followed by Dr. Koleen Nimrod in [place]  . Congestive heart failure, unspecified     EF now 60%, previous nonischemic cardiomyopathy  . Pacemaker     Single chamber [employer] [place]. [employer] ACCENT [phone] SN 719 04/[birth year]/1959 10/31/2010  . Mitral stenosis     Felt to be moderate by catheterization, but TEE only show mild mitral stenosis. Also no significant mitral regurgitation.  . Hypercholesterolemia   . Diverticulitis Dec 2012     Past Surgical History  Procedure Date  . Single-chamber pacemaker implantation 3/12    by Dr Caryl Comes  . Evacuation of retroperitoneal and left rectus sheath   . Cholecystectomy     40+ years ago  . Hernia repair     X3  . Pilonidal cyst / sinus excision   . Esophagogastroduodenoscopy 10/2011    erosive gastritis, biopsy with no H. pylori, hiatal hernia, peptic duodenitis, no celiac disease, duodenal diverticulum  . Colonoscopy 10/2011    severe sigmoid and descending diverticulosis, internal hemorrhoids     Family History  Problem Relation Age of Onset  . Cancer Mother     Uterus  . Cancer Daughter     kidney cancer, in remission  . Heart disease Father   . Colon cancer Neg Hx      History   Social History  . Marital Status: Single    Spouse Name: N/A    Number of Children: 2  . Years of  Education: N/A   Occupational History  . RETIRED    Social History Main Topics  . Smoking status: Former Smoker -- 2.0 packs/day for 45 years    Types: Cigarettes    Quit date: 08/10/2006  . Smokeless tobacco: Never Used  . Alcohol Use: No  . Drug  Use: No  . Sexually Active: Not on file   Other Topics Concern  . Not on file   Social History Narrative  . No narrative on file     PHYSICAL EXAM   BP 107/67  Pulse 60  Ht 5\' 3"  (1.6 m)  Wt 139 lb (63.05 kg)  BMI 24.62 kg/m2  SpO2 99%  Constitutional: She is oriented to person, place, and time. She appears well-developed and well-nourished. No distress.  HENT: No nasal discharge.  Head: Normocephalic and atraumatic.  Eyes: Pupils are equal and round. Right eye exhibits no discharge. Left eye exhibits no discharge.  Neck: Normal range of motion. Neck supple. No JVD present. No thyromegaly present.  Cardiovascular: Normal rate, regular rhythm, normal heart sounds. Exam reveals no gallop and no friction rub. No murmur heard.  Pulmonary/Chest: Effort normal and breath sounds normal. No stridor. No respiratory distress. She has no wheezes. She has no rales. She exhibits no tenderness.  Abdominal: Soft. Bowel sounds are normal. She exhibits no distension. There is no tenderness. There is no rebound and no guarding.  Musculoskeletal: Normal range of motion. She exhibits no edema and no tenderness.  Neurological: She is alert and oriented to person, place, and time. Coordination normal.  Skin: Skin is warm and dry. No rash noted. She is not diaphoretic. No erythema. No pallor.  Psychiatric: She has a normal mood and affect. Her behavior is normal. Judgment and thought content normal.      ASSESSMENT AND PLAN

## 2011-10-27 NOTE — Progress Notes (Signed)
REVIEWED. AGREE. 

## 2011-10-27 NOTE — Telephone Encounter (Signed)
Patient had Enterococcus and Ecoli in urine of significant CFUs. Treated with Cipro, sensitive for both organisms. Patient was feeling better.   Hold on Elavil and changing PPI. Hold on HBT for SIBO. If recurrent symptoms, then consider these changes. She is being referred to [facility] still since takes awhile to get in.

## 2011-10-27 NOTE — Progress Notes (Signed)
Quick Note:  Urine culture sensitivities show that Cipro should cover both organisms.  Changes as per last OV. Will hold on changing PPI, adding Elavil, HBT for Small bowel bacterial overgrowth unless she has recurrent symptoms.  ______

## 2011-10-30 ENCOUNTER — Other Ambulatory Visit: Payer: Self-pay | Admitting: *Deleted

## 2011-10-30 ENCOUNTER — Encounter: Payer: Self-pay | Admitting: *Deleted

## 2011-10-30 MED ORDER — SIMVASTATIN 10 MG PO TABS: 10.0000 mg | ORAL_TABLET | Freq: Every day | ORAL | Status: DC

## 2011-11-02 ENCOUNTER — Telehealth: Payer: Self-pay | Admitting: Gastroenterology

## 2011-11-02 ENCOUNTER — Other Ambulatory Visit: Payer: Self-pay

## 2011-11-02 DIAGNOSIS — Z7901 Long term (current) use of anticoagulants: Secondary | ICD-10-CM

## 2011-11-02 DIAGNOSIS — Z5181 Encounter for therapeutic drug level monitoring: Secondary | ICD-10-CM

## 2011-11-02 MED ORDER — FLUCONAZOLE 150 MG PO TABS: 150.0000 mg | ORAL_TABLET | Freq: Once | ORAL | Status: AC

## 2011-11-02 NOTE — Telephone Encounter (Signed)
Called and could pt did not answer. Could not leave a message at this time.

## 2011-11-02 NOTE — Telephone Encounter (Signed)
Find out how many cipro she has left.   She can take diflucan 150mg  X 1 for yeast infection. RX sent to pharmacy. Please verify she is speaking of vaginal yeast infection.  What dose coumadin is she taking, I told her to reduce dose while on abx.  Let's get PT/INR to make sure not too high.  Call PCP regarding headaches.

## 2011-11-02 NOTE — Progress Notes (Signed)
Opened in error. Lab order had been entered by Neil Crouch, PA.

## 2011-11-02 NOTE — Telephone Encounter (Signed)
Called pt. She said that she was concerned because she had a headache almost all day yesterday. Today she does not have it. She is still taking antibiotics. Said she has missed a bunch of the Cipro because she would forget to take it when she was supposed to and then she wouldn't try to make it up. She is still taking carafate. It has helped a lot. She feels she has a yeast infections now. Please advise!

## 2011-11-02 NOTE — Telephone Encounter (Signed)
Pt called back.1. She did say that she has some burning and itching in her vaginal area and I told her she could take the Diflucan, that Rx was sent.  2. Pt said she has #9 Cipro left.  3. Michela Pitcher she is taking 1/2 mg of Coumadin. Michela Pitcher she has 2 mg tablets and she cuts them in half and then cuts them in half again.  4. She request that her lab order for PT/INR be faxed to [facility]. Said she is going to call them tomorrow for appt. Said she has had some pain below her right rib.   She also said that she has times when her heart bests fast, but she is used to it. I told her to call 911 if she has problems or go to the hospital. She said she would.

## 2011-11-02 NOTE — Telephone Encounter (Signed)
Pt called this morning asking for LSL about her medicines. She is confused on how to take her medicine. Please call her back  At [phone]

## 2011-11-02 NOTE — Telephone Encounter (Signed)
Addended by: Mahala Menghini on: 11/02/2011 01:45 PM   Modules accepted: Orders

## 2011-11-02 NOTE — Telephone Encounter (Signed)
Tried to call pt again. Still unable to leave a message.

## 2011-11-03 DIAGNOSIS — I4891 Unspecified atrial fibrillation: Secondary | ICD-10-CM | POA: Diagnosis not present

## 2011-11-03 NOTE — Telephone Encounter (Signed)
CALL PT. SHE NEEDS AN APPT E 30 VISIT WITH ANYONE THIS WEEK TO SIT DOWN & EXPLAIN WHAT MEDS SHE NEEDS TO BE ON. SHE SHOULD BRING A FAMILY MEMBER WHO HELPS HER WITH HER CARE. TALKING TO HER ON THE PHONE DOES NOT APPEAR TO BE HELPING

## 2011-11-03 NOTE — Telephone Encounter (Signed)
Let's make sure her next OV is with SLF not extender. She currently is scheduled in May with [name]. Please change that to [facility] in May.

## 2011-11-03 NOTE — Telephone Encounter (Signed)
Pt was informed. She called PCP yesterday. They did not call her back. She is going to try to get in today. She is aware that her lab order has been faxed there for her PT/INR.

## 2011-11-03 NOTE — Telephone Encounter (Signed)
Pt is scheduled to see Neil Crouch, PA at 2:00 PM tomorrow, 11/04/2011. Pt is bringing her son-in-law.

## 2011-11-03 NOTE — Telephone Encounter (Signed)
She needs to complete Cipro in order to get rid of the UTI. If after completing the Cipro she has recurrent abdominal burning or burning with urination, she may need another urine culture to verify infection cleared up.  Get INR. Please make sure we get the results asap.  She needs to discuss heart issues with her PCP asap.

## 2011-11-04 ENCOUNTER — Ambulatory Visit (INDEPENDENT_AMBULATORY_CARE_PROVIDER_SITE_OTHER): Payer: Medicare Other | Admitting: Gastroenterology

## 2011-11-04 ENCOUNTER — Encounter: Payer: Self-pay | Admitting: Gastroenterology

## 2011-11-04 VITALS — BP 112/61 | HR 65 | Temp 98.3°F | Ht 62.0 in | Wt 139.2 lb

## 2011-11-04 DIAGNOSIS — R1013 Epigastric pain: Secondary | ICD-10-CM

## 2011-11-04 DIAGNOSIS — R634 Abnormal weight loss: Secondary | ICD-10-CM

## 2011-11-04 DIAGNOSIS — K3189 Other diseases of stomach and duodenum: Secondary | ICD-10-CM | POA: Diagnosis not present

## 2011-11-04 DIAGNOSIS — R197 Diarrhea, unspecified: Secondary | ICD-10-CM | POA: Insufficient documentation

## 2011-11-04 DIAGNOSIS — R300 Dysuria: Secondary | ICD-10-CM | POA: Diagnosis not present

## 2011-11-04 DIAGNOSIS — K838 Other specified diseases of biliary tract: Secondary | ICD-10-CM

## 2011-11-04 NOTE — Progress Notes (Signed)
Primary Care Physician: [name], FNP, FNP  Primary Gastroenterologist:  Barney Drain, MD    Chief Complaint  Patient presents with  . Follow-up    HPI: [name] is a 77 y.o. female here at request of Dr. Oneida Alar. We have had hard time communicating with patient on the phone. She has had numerous questions about medication instructions. She has not brought her medications to the office today as requested. She did present with family member per Dr. Oneida Alar' request.  I saw her back on 3/11 and 3/13. She complained of midabdominal burning with meals and weight loss for couple of months. I increased prilosec to 40mg  am and 20mg  in pm. Added carafate and she called back stating her burning had resolved. U/A with culture showed UTI with enterococcus/E.coli. She was given Cipro but has been forgetting to take it. I gave her instructions to reduce her coumadin while on cipro but she tells me she reduced her glipizide instead. She has almost complete flagyl (given empirically for ?diveritculitis before U/A and culture came back.   Work-up has included EGD and colonoscopy as outlined in [place]. She has had numerous CTs at outside facilities when she presented to ED. I personally reviewed prior contrasted CT with Dr. Rolm Baptise (done 09/01/2011). Mesenteric vasculature looks good and nothing to be gained by CTA abd. Stable biliary dilation with CBD 1.61mm s/p cholecystectomy, circumferential wall thickening of transverse colon ?due to peristalsis. CT noncontrast 09/06/11, persistent ventral herniation containing nondilation transverse colon without wall thickening.  Sore in lower abd and right rib. 2 weeks diarrhea, postprandial, which is new symptom. States she has constant burning in her lower abdomen. Denies heartburn. No blood in stool or melena. Afraid to eat. States food makes her burn more. According to son-in-law here today, she was doing fine until about one year ago when he and wife  moved out. Patient used to eat anything but now avoiding food. Symptoms seem to be worse with change from metformin to glyburide 6 months ago due to increased Creatinine and interaction with another medication. Patient feels very weak. Goes to a doctor at least twice weekly looking for answers.   Not sleeping well.    Current Outpatient Prescriptions  Medication Sig Dispense Refill  . ciprofloxacin (CIPRO) 500 MG tablet Take 500 mg by mouth 2 (two) times daily.      . clonazePAM (KLONOPIN) 0.5 MG tablet Take 1 tablet by mouth Three times daily as needed. For anxiety      . digoxin (LANOXIN) 0.125 MG tablet Take 1 tablet (125 mcg total) by mouth daily.  30 tablet  9  . diltiazem (CARTIA XT) 240 MG 24 hr capsule Take 1 capsule (240 mg total) by mouth daily.  30 capsule  6  . Fluticasone-Salmeterol (ADVAIR DISKUS) 250-50 MCG/DOSE AEPB Inhale 1 puff into the lungs every 12 (twelve) hours.        . furosemide (LASIX) 40 MG tablet Take 40 mg by mouth daily.        [name] glyBURIDE (DIABETA) 5 MG tablet Take 2.5 mg by mouth daily with breakfast.       . HYDROcodone-acetaminophen (NORCO) 5-325 MG per tablet Take 1 tablet by mouth every 6 (six) hours as needed. For pain       . metoprolol tartrate (LOPRESSOR) 25 MG tablet Take 1 tablet (25 mg total) by mouth 2 (two) times daily.  60 tablet  5  . metroNIDAZOLE (FLAGYL) 500 MG tablet Take 1 tablet (500 mg  total) by mouth 3 (three) times daily.  30 tablet  0  . omeprazole (PRILOSEC OTC) 20 MG tablet Take one 30 minutes before supper for the next two weeks only.  14 tablet  0  . omeprazole (PRILOSEC) 40 MG capsule Take 40 mg by mouth daily.        [name] OVER THE COUNTER MEDICATION Place 1 patch onto the skin as needed. "Pain patch from [place]" only uses when needed for hip pain       . PROAIR HFA 108 (90 BASE) MCG/ACT inhaler Take 2 puffs by mouth Every 6 hours as needed. For shortness of breath      . Probiotic Product (ALIGN) 4 MG CAPS Take 4 mg by mouth daily.   21 capsule  0  . simvastatin (ZOCOR) 10 MG tablet Take 1 tablet (10 mg total) by mouth at bedtime.  30 tablet  6  . sucralfate (CARAFATE) 1 GM/10ML suspension Take 10 mLs (1 g total) by mouth 4 (four) times daily. Take before meals and at bedtime for 10 days.  420 mL  0  . tiotropium (SPIRIVA) 18 MCG inhalation capsule Place 18 mcg into inhaler and inhale daily.        [name] warfarin (COUMADIN) 1 MG tablet Take 1 tablet (1 mg total) by mouth daily.      . zafirlukast (ACCOLATE) 20 MG tablet Take 20 mg by mouth 2 (two) times daily.        [name] docusate sodium (COLACE) 100 MG capsule Take 100 mg by mouth 2 (two) times daily as needed. For constipation       . fluconazole (DIFLUCAN) 150 MG tablet Take 1 tablet (150 mg total) by mouth once.  1 tablet  0  . polyethylene glycol (MIRALAX / GLYCOLAX) packet Take 17 g by mouth daily as needed. For constipation        Allergies as of 11/04/2011 - Review Complete 11/04/2011  Allergen Reaction Noted  . Tramadol Nausea Only 12/03/2010   Past Medical History  Diagnosis Date  . Permanent atrial fibrillation     Tachybradycardia syndrome on Coumadin anticoagulation  . CAD (coronary artery disease) 2011    Catheterization August 2011 mild nonobstructive coronary artery disease complicated by large left rectus sheath hematoma status post evacuation Coumadin restarted without recurrence  . Diabetes mellitus   . Chronic airway obstruction, not elsewhere classified   . Type II or unspecified type diabetes mellitus without mention of complication, not stated as uncontrolled   . Anemia, unspecified   . Chronic kidney disease, stage II (mild)   . Unspecified hypertensive kidney disease with chronic kidney disease stage I through stage IV, or unspecified   . Tachycardia-bradycardia     s/p PPM by Dr Caryl Comes  . GERD (gastroesophageal reflux disease)   . Abnormal CT of the chest     Mediastinal and hilar adenopathy followed by Dr. Koleen Nimrod in [place]  . Congestive heart  failure, unspecified     EF now 60%, previous nonischemic cardiomyopathy  . Pacemaker     Single chamber [place]. Jude ACCENT [phone] SN 719 04/[birth year]/1959 10/31/2010  . Mitral stenosis     Felt to be moderate by catheterization, but TEE only show mild mitral stenosis. Also no significant mitral regurgitation.  . Hypercholesterolemia   . Diverticulitis Dec 2012   Past Surgical History  Procedure Date  . Single-chamber pacemaker implantation 3/12    by Dr Caryl Comes  . Evacuation of retroperitoneal and left rectus sheath   .  Cholecystectomy     40+ years ago  . Hernia repair     X3  . Pilonidal cyst / sinus excision   . Esophagogastroduodenoscopy 10/2011    erosive gastritis, biopsy with no H. pylori, hiatal hernia, peptic duodenitis, no celiac disease, duodenal diverticulum  . Colonoscopy 10/2011    severe sigmoid and descending diverticulosis, internal hemorrhoids     ROS:  General: See HPI. Weight down 8 pounds since 09/28/11. ENT: Negative for hoarseness, difficulty swallowing , nasal congestion. CV: Negative for chest pain, angina, dyspnea on exertion, peripheral edema. C/o palpitations.  Respiratory: Negative for dyspnea at rest, dyspnea on exertion, cough, sputum, wheezing.  GI: See history of present illness. GU:  Negative for dysuria, hematuria, urinary incontinence, urinary frequency, nocturnal urination.  Endo: see general   Physical Examination:   BP 112/61  Pulse 65  Temp(Src) 98.3 F (36.8 C) (Temporal)  Ht 5\' 2"  (1.575 m)  Wt 139 lb 3.2 oz (63.141 kg)  BMI 25.46 kg/m2  General: Well-nourished, well-developed in no acute distress. Accompanied by brother-in-law. Eyes: No icterus. Mouth: Oropharyngeal mucosa moist and pink , no lesions erythema or exudate. Edentulous. Lungs: Clear to auscultation bilaterally.  Heart: Regular rate and rhythm, no murmurs rubs or gallops.  Abdomen: Bowel sounds are normal, diffuse mild tenderness in lower abd. Herniation left of midline.   nondistended, no hepatosplenomegaly or masses, no abdominal bruits or hernia , no rebound or guarding.   Extremities: No lower extremity edema. No clubbing or deformities. Neuro: Alert and oriented x 4   Skin: Warm and dry, no jaundice.   Psych: Alert and cooperative, normal mood and affect.  Labs:  Lab Results  Component Value Date   WBC 12.6* 11/04/2011   HGB 12.2 11/04/2011   HCT 37.7 11/04/2011   MCV 94.7 11/04/2011   PLT 238 11/04/2011   Lab Results  Component Value Date   CREATININE 1.40* 11/04/2011   BUN 28* 11/04/2011   NA 140 11/04/2011   K 3.6 11/04/2011   CL 106 11/04/2011   CO2 25 11/04/2011   Lab Results  Component Value Date   ALT 12 11/04/2011   AST 25 11/04/2011   ALKPHOS 60 11/04/2011   BILITOT 0.4 11/04/2011     TSH  1.73 10/2011          Lab Results  Component Value Date   LIPASE 46 10/21/2011

## 2011-11-04 NOTE — Patient Instructions (Signed)
Please keep your appointment at [facility] next week. Please make sure you take them a list of ALL of your medications. Please take them any available abdominal films/CTs, etc Please have your blood work done and stool test done as soon as possible.  Please make sure they address the following: Weight loss Abdominal pain Dilated common bile duct

## 2011-11-05 ENCOUNTER — Other Ambulatory Visit: Payer: Self-pay | Admitting: *Deleted

## 2011-11-05 LAB — CBC WITH DIFFERENTIAL/PLATELET
Eosinophils Relative: 1 % (ref 0–5)
Lymphocytes Relative: 20 % (ref 12–46)
Lymphs Abs: 2.5 10*3/uL (ref 0.7–4.0)
MCV: 94.7 fL (ref 78.0–100.0)
Neutrophils Relative %: 72 % (ref 43–77)
Platelets: 238 10*3/uL (ref 150–400)
RBC: 3.98 MIL/uL (ref 3.87–5.11)
WBC: 12.6 10*3/uL — ABNORMAL HIGH (ref 4.0–10.5)

## 2011-11-05 LAB — COMPREHENSIVE METABOLIC PANEL
ALT: 12 U/L (ref 0–35)
Albumin: 3.7 g/dL (ref 3.5–5.2)
CO2: 25 mEq/L (ref 19–32)
Calcium: 9.6 mg/dL (ref 8.4–10.5)
Chloride: 106 mEq/L (ref 96–112)
Sodium: 140 mEq/L (ref 135–145)
Total Protein: 6.6 g/dL (ref 6.0–8.3)

## 2011-11-05 MED ORDER — METOPROLOL TARTRATE 25 MG PO TABS: 25.0000 mg | ORAL_TABLET | Freq: Two times a day (BID) | ORAL | Status: DC

## 2011-11-05 NOTE — Progress Notes (Signed)
Cc to PCP & [facility]

## 2011-11-05 NOTE — Assessment & Plan Note (Signed)
Ongoing weight loss in setting of postprandial abdominal pain. Mesenteric ischemia unlikely given CT findings. EGD/TCS failed to show cause for significant weight loss. Patient has eliminated gastric irritants without relief. ?dilated CBD significant. Patient has appointment at [facility] next week. Will await their input. OV here with Dr. Oneida Alar in 4 weeks.   Encouraged patient to increase oral intake, bland diet, avoid gastric irritants. She has had 8 pound weight loss in one month which is very concerning.

## 2011-11-05 NOTE — Progress Notes (Signed)
Faxed to PCP

## 2011-11-05 NOTE — Assessment & Plan Note (Signed)
Recent onset diarrhea with multiple rounds of antibiotics lately. She is currently on Flagyl too. Check CDiff PCR.

## 2011-11-05 NOTE — Assessment & Plan Note (Signed)
Complete Cipro. If persistent symptoms, then consider reculture as patient had interruption in antibiotics due to noncompliance.

## 2011-11-05 NOTE — Progress Notes (Signed)
Quick Note:  Patient needs to increase fluid intake. She needs to have light yellow urine, if she is not, she is not drinking enough. Await CDiff. She should complete her antibiotics as recommended. ______

## 2011-11-05 NOTE — Assessment & Plan Note (Signed)
Chronically dilation CBD. Normal LFTs. Cannot have MRI due to pacemaker. Appointment at [facility] next week. I asked she request input regarding this issue as well. May not be significant to her symptoms, weight loss.

## 2011-11-05 NOTE — Progress Notes (Signed)
Quick Note:  Pt aware, she has not turned in cdiff yet. She said she would asap. ______

## 2011-11-06 NOTE — Progress Notes (Signed)
REVIEWED.  

## 2011-11-09 DIAGNOSIS — R197 Diarrhea, unspecified: Secondary | ICD-10-CM | POA: Diagnosis not present

## 2011-11-09 DIAGNOSIS — R634 Abnormal weight loss: Secondary | ICD-10-CM | POA: Diagnosis not present

## 2011-11-09 DIAGNOSIS — R1013 Epigastric pain: Secondary | ICD-10-CM | POA: Diagnosis not present

## 2011-11-11 ENCOUNTER — Telehealth: Payer: Self-pay

## 2011-11-11 NOTE — Telephone Encounter (Signed)
Pt called and said that Neil Crouch, PA asked her to stop the Zocor while she took the antibiotics. Said she has finished them now, is it OK to start back on Zocor. She is seeing [name] for her coumadin. She also said to let Magda Paganini know that she saw Dr. Spero Curb at [facility] and that she has an appt on 11/16/2011 for a procedure there.

## 2011-11-11 NOTE — Telephone Encounter (Signed)
Noted. We will await records.

## 2011-11-12 NOTE — Telephone Encounter (Signed)
Per Magda Paganini, pt can go back on the Zocor. Pt was informed.

## 2011-11-13 DIAGNOSIS — I4891 Unspecified atrial fibrillation: Secondary | ICD-10-CM | POA: Diagnosis not present

## 2011-11-13 DIAGNOSIS — F329 Major depressive disorder, single episode, unspecified: Secondary | ICD-10-CM | POA: Diagnosis not present

## 2011-11-16 DIAGNOSIS — F411 Generalized anxiety disorder: Secondary | ICD-10-CM | POA: Diagnosis not present

## 2011-11-16 DIAGNOSIS — M129 Arthropathy, unspecified: Secondary | ICD-10-CM | POA: Diagnosis not present

## 2011-11-16 DIAGNOSIS — R634 Abnormal weight loss: Secondary | ICD-10-CM | POA: Diagnosis not present

## 2011-11-16 DIAGNOSIS — Z9089 Acquired absence of other organs: Secondary | ICD-10-CM | POA: Diagnosis not present

## 2011-11-16 DIAGNOSIS — Z87891 Personal history of nicotine dependence: Secondary | ICD-10-CM | POA: Diagnosis not present

## 2011-11-16 DIAGNOSIS — I509 Heart failure, unspecified: Secondary | ICD-10-CM | POA: Diagnosis not present

## 2011-11-16 DIAGNOSIS — Z950 Presence of cardiac pacemaker: Secondary | ICD-10-CM | POA: Diagnosis not present

## 2011-11-16 DIAGNOSIS — Z8719 Personal history of other diseases of the digestive system: Secondary | ICD-10-CM | POA: Diagnosis not present

## 2011-11-16 DIAGNOSIS — R011 Cardiac murmur, unspecified: Secondary | ICD-10-CM | POA: Diagnosis not present

## 2011-11-16 DIAGNOSIS — Z9889 Other specified postprocedural states: Secondary | ICD-10-CM | POA: Diagnosis not present

## 2011-11-16 DIAGNOSIS — D649 Anemia, unspecified: Secondary | ICD-10-CM | POA: Diagnosis not present

## 2011-11-16 DIAGNOSIS — N189 Chronic kidney disease, unspecified: Secondary | ICD-10-CM | POA: Diagnosis not present

## 2011-11-16 DIAGNOSIS — Q441 Other congenital malformations of gallbladder: Secondary | ICD-10-CM | POA: Diagnosis not present

## 2011-11-16 DIAGNOSIS — I2510 Atherosclerotic heart disease of native coronary artery without angina pectoris: Secondary | ICD-10-CM | POA: Diagnosis not present

## 2011-11-16 DIAGNOSIS — R1013 Epigastric pain: Secondary | ICD-10-CM | POA: Diagnosis not present

## 2011-11-16 DIAGNOSIS — K831 Obstruction of bile duct: Secondary | ICD-10-CM | POA: Diagnosis not present

## 2011-11-16 DIAGNOSIS — R933 Abnormal findings on diagnostic imaging of other parts of digestive tract: Secondary | ICD-10-CM | POA: Diagnosis not present

## 2011-11-16 DIAGNOSIS — I4891 Unspecified atrial fibrillation: Secondary | ICD-10-CM | POA: Diagnosis not present

## 2011-11-16 DIAGNOSIS — K219 Gastro-esophageal reflux disease without esophagitis: Secondary | ICD-10-CM | POA: Diagnosis not present

## 2011-11-16 DIAGNOSIS — E119 Type 2 diabetes mellitus without complications: Secondary | ICD-10-CM | POA: Diagnosis not present

## 2011-11-16 DIAGNOSIS — J449 Chronic obstructive pulmonary disease, unspecified: Secondary | ICD-10-CM | POA: Diagnosis not present

## 2011-11-16 HISTORY — PX: EUS: SHX5427

## 2011-11-18 ENCOUNTER — Ambulatory Visit (INDEPENDENT_AMBULATORY_CARE_PROVIDER_SITE_OTHER): Payer: Medicare Other | Admitting: Internal Medicine

## 2011-11-18 ENCOUNTER — Encounter: Payer: Self-pay | Admitting: Internal Medicine

## 2011-11-18 VITALS — BP 109/67 | HR 60 | Ht 63.0 in | Wt 147.0 lb

## 2011-11-18 DIAGNOSIS — I495 Sick sinus syndrome: Secondary | ICD-10-CM

## 2011-11-18 DIAGNOSIS — I4891 Unspecified atrial fibrillation: Secondary | ICD-10-CM

## 2011-11-18 LAB — PACEMAKER DEVICE OBSERVATION
BATTERY VOLTAGE: 2.9629 V
BRDY-0005RV: 50 {beats}/min
DEVICE MODEL PM: 7199163
RV LEAD IMPEDENCE PM: 800 Ohm
VENTRICULAR PACING PM: 79

## 2011-11-18 NOTE — Assessment & Plan Note (Signed)
Normal pacemaker function See [facility] report No changes today  

## 2011-11-18 NOTE — Assessment & Plan Note (Signed)
Permanent afib, appropriately anticoagulated with coumadin Though she has subjective tachypalpitations, her PPM interrogation today reveals very good rate control.  In fact, she is V paced 79% at 60 bpm. I will therefore make no changes today. She can take an additional metoprolol 25mg  prn when palpitations are more prominent.

## 2011-11-18 NOTE — Progress Notes (Signed)
Primary Cardiologist:  Dr Dannielle Burn PCP:  Dr Quillian Quince  The patient presents today for routine electrophysiology followup. She reports occasional palpitations with moderate activity with afib.   Her primary concern is with ongoing GI issues.  She has mesenteric ischemic for which she sees Dr Oneida Alar.  Today, she denies symptoms of  chest pain, shortness of breath, dizziness, presyncope, syncope, or neurologic sequela. She has stable edema. The patient feels that she is tolerating medications without difficulties and is otherwise without complaint today.   Past Medical History  Diagnosis Date  . Permanent atrial fibrillation     Tachybradycardia syndrome on Coumadin anticoagulation  . CAD (coronary artery disease) 2011    Catheterization August 2011 mild nonobstructive coronary artery disease complicated by large left rectus sheath hematoma status post evacuation Coumadin restarted without recurrence  . Diabetes mellitus   . Chronic airway obstruction, not elsewhere classified   . Type II or unspecified type diabetes mellitus without mention of complication, not stated as uncontrolled   . Anemia, unspecified   . Chronic kidney disease, stage II (mild)   . Unspecified hypertensive kidney disease with chronic kidney disease stage I through stage IV, or unspecified   . Tachycardia-bradycardia     s/p PPM by Dr Caryl Comes  . GERD (gastroesophageal reflux disease)   . Abnormal CT of the chest     Mediastinal and hilar adenopathy followed by Dr. Koleen Nimrod in [place]  . Congestive heart failure, unspecified     EF now 60%, previous nonischemic cardiomyopathy  . Pacemaker     Single chamber [place]. Jude ACCENT [phone] SN 719 04/11/1959 10/31/2010  . Mitral stenosis     Felt to be moderate by catheterization, but TEE only show mild mitral stenosis. Also no significant mitral regurgitation.  . Hypercholesterolemia   . Diverticulitis Dec 2012   Past Surgical History  Procedure Date  . Single-chamber pacemaker  implantation 3/12    by Dr Caryl Comes  . Evacuation of retroperitoneal and left rectus sheath   . Cholecystectomy     40+ years ago  . Hernia repair     X3  . Pilonidal cyst / sinus excision   . Esophagogastroduodenoscopy 10/2011    erosive gastritis, biopsy with no H. pylori, hiatal hernia, peptic duodenitis, no celiac disease, duodenal diverticulum  . Colonoscopy 10/2011    severe sigmoid and descending diverticulosis, internal hemorrhoids    Current Outpatient Prescriptions  Medication Sig Dispense Refill  . clonazePAM (KLONOPIN) 0.5 MG tablet Take 0.5 mg by mouth 3 (three) times daily as needed. Future refills are to come from PCP      . digoxin (LANOXIN) 0.125 MG tablet Take 1 tablet (125 mcg total) by mouth daily.  30 tablet  9  . diltiazem (CARTIA XT) 240 MG 24 hr capsule Take 1 capsule (240 mg total) by mouth daily.  30 capsule  6  . docusate sodium (COLACE) 100 MG capsule Take 100 mg by mouth 2 (two) times daily as needed. For constipation       . Fluticasone-Salmeterol (ADVAIR DISKUS) 250-50 MCG/DOSE AEPB Inhale 1 puff into the lungs every 12 (twelve) hours.        . furosemide (LASIX) 40 MG tablet Take 40 mg by mouth daily.        [name] glyBURIDE (DIABETA) 5 MG tablet Take 5 mg by mouth daily with breakfast.       . HYDROcodone-acetaminophen (NORCO) 5-325 MG per tablet Take 1 tablet by mouth every 6 (six) hours as needed.  For pain       . metoprolol tartrate (LOPRESSOR) 25 MG tablet Take 1 tablet (25 mg total) by mouth 2 (two) times daily.  60 tablet  6  . omeprazole (PRILOSEC) 40 MG capsule Take 40 mg by mouth daily.        [name] OVER THE COUNTER MEDICATION Place 1 patch onto the skin as needed. "Pain patch from [place]" only uses when needed for hip pain       . polyethylene glycol (MIRALAX / GLYCOLAX) packet Take 17 g by mouth daily as needed. For constipation      . PROAIR HFA 108 (90 BASE) MCG/ACT inhaler Take 2 puffs by mouth Every 6 hours as needed. For shortness of breath      .  Probiotic Product (ALIGN) 4 MG CAPS Take 4 mg by mouth daily.  21 capsule  0  . simvastatin (ZOCOR) 10 MG tablet Take 1 tablet (10 mg total) by mouth at bedtime.  30 tablet  6  . sucralfate (CARAFATE) 1 GM/10ML suspension Take 10 mLs (1 g total) by mouth 4 (four) times daily. Take before meals and at bedtime for 10 days.  420 mL  0  . tiotropium (SPIRIVA) 18 MCG inhalation capsule Place 18 mcg into inhaler and inhale daily.        [name] warfarin (COUMADIN) 1 MG tablet Take 1 tablet (1 mg total) by mouth daily.      . zafirlukast (ACCOLATE) 20 MG tablet Take 20 mg by mouth 2 (two) times daily.        [name] DISCONTD: clonazePAM (KLONOPIN) 0.5 MG tablet Take 1 tablet (0.5 mg total) by mouth 2 (two) times daily as needed for anxiety. Future refills are to come from PCP  30 tablet  0  . ciprofloxacin (CIPRO) 500 MG tablet Take 500 mg by mouth 2 (two) times daily.      . clonazePAM (KLONOPIN) 0.5 MG tablet Take 1 tablet (0.5 mg total) by mouth 2 (two) times daily.  60 tablet  1  . DISCONTD: clonazePAM (KLONOPIN) 0.5 MG tablet Take 1 tablet by mouth Three times daily as needed. For anxiety        Allergies  Allergen Reactions  . Tramadol Nausea Only    History   Social History  . Marital Status: Single    Spouse Name: N/A    Number of Children: 2  . Years of Education: N/A   Occupational History  . RETIRED    Social History Main Topics  . Smoking status: Former Smoker -- 2.0 packs/day for 45 years    Types: Cigarettes    Quit date: 08/10/2006  . Smokeless tobacco: Never Used  . Alcohol Use: No  . Drug Use: No  . Sexually Active: Not on file   Other Topics Concern  . Not on file   Social History Narrative  . No narrative on file    Family History  Problem Relation Age of Onset  . Cancer Mother     Uterus  . Cancer Daughter     kidney cancer, in remission  . Heart disease Father   . Colon cancer Neg Hx     Physical Exam: Filed Vitals:   11/18/11 0918  BP: 109/67  Pulse: 60    Height: 5\' 3"  (1.6 m)  Weight: 147 lb (66.679 kg)    GEN- The patient is elderly appearing, alert and oriented x 3 today.   Head- normocephalic, atraumatic Eyes-  Sclera clear, conjunctiva pink  Ears- hearing intact Oropharynx- clear Neck- supple, no JVP Lymph- no cervical lymphadenopathy Lungs- Clear to ausculation bilaterally, normal work of breathing Chest- pacemaker pocket is well healed Heart- Regular rate and rhythm, 2/6 SEM LSB GI- soft, NT, ND, + BS Extremities- no clubbing, cyanosis, 1+edema  Pacemaker interrogation- reviewed in detail today,  See PACEART report  Assessment and Plan:

## 2011-11-18 NOTE — Patient Instructions (Signed)
Your physician you to follow up in 1 year. You will receive a reminder letter in the mail one-two months in advance. If you don't receive a letter, please call our office to schedule the follow-up appointment. Your physician recommends that you continue on your current medications as directed. Please refer to the Current Medication list given to you today. 

## 2011-11-22 NOTE — Progress Notes (Signed)
MAR 2013 CR 1.0 TO 1.4. CHECK H PYLORI STOOL AG. FOLLOW UP IN 6 WEEKS W/ SL[facility] E 30 VISIT. IF STOOL AG NEGATIVE & PT HAVING PAIN/WEIGHT LOSS, WOULD CONSIDER CTA.

## 2011-11-23 ENCOUNTER — Telehealth: Payer: Self-pay

## 2011-11-23 ENCOUNTER — Other Ambulatory Visit: Payer: Self-pay

## 2011-11-23 DIAGNOSIS — R197 Diarrhea, unspecified: Secondary | ICD-10-CM

## 2011-11-23 NOTE — Progress Notes (Signed)
Informed pt. She has been off antibiotics for 2 weeks. She will d/c the omeprazole for 2 weeks and then do the H. Pylori stool antigen. ( Container and lab order at front for pick up.)

## 2011-11-23 NOTE — Progress Notes (Signed)
See SLF orders below. Please arrange as per SLF.Please note Hpylori stool Ag has to be done off Antibiotics and PPI for two weeks.

## 2011-11-23 NOTE — Telephone Encounter (Signed)
I called pt to tell her to do the H. Pylori stool test. ( See separate documentation). She said she is constipated and has been taking the Miralax daily and the colace bid. Said she has had to give her self a warm water enema for 4-5 days to have any BM at all, and still has not had very much. Please advise!

## 2011-11-24 NOTE — Telephone Encounter (Signed)
Pt informed

## 2011-11-24 NOTE — Telephone Encounter (Signed)
Call pt. SHE MAY USE MIRALAX TID FOR THE NEXT 3 DAYS TO ASSIST WITH HAVING A BM. AVOID DAILY ENEMAS. SHE MAY USE AN ENEMA Q3DAYS.

## 2011-11-24 NOTE — Progress Notes (Signed)
Pt is aware of OV on 5/1 at 230 with SF in E30

## 2011-11-27 ENCOUNTER — Telehealth: Payer: Self-pay

## 2011-11-27 NOTE — Telephone Encounter (Signed)
PLEASE CALL PT. SHE SHOULD CONTINUE MIRALX TID UNTIL SHE HAS A GOOD BM.

## 2011-11-27 NOTE — Telephone Encounter (Signed)
PT called to say she has not had a good BM since Wed. ( She had just a tiny bit yesterday). She has not been taking the Miralax as she should. She has only taken it 5 times instead of 9 times in 3 days. She is not eating a lot. She has scrambled egg and toast for breakfast and after that mostly just a sandwich later in the day. Please advise!

## 2011-11-27 NOTE — Telephone Encounter (Signed)
Pt called and was informed.  

## 2011-12-01 DIAGNOSIS — I4891 Unspecified atrial fibrillation: Secondary | ICD-10-CM | POA: Diagnosis not present

## 2011-12-02 ENCOUNTER — Ambulatory Visit: Payer: Medicare Other | Admitting: Urgent Care

## 2011-12-02 ENCOUNTER — Telehealth: Payer: Self-pay

## 2011-12-02 NOTE — Telephone Encounter (Signed)
Returned pt's call. She is c/o of abdominal pain/ cramps and burning. Said she knows that she is not eating right. She has about two BM's daily, and they are watery. Per Neil Crouch, PA and Dr. Oneida Alar scheduled OV on 12/03/2011 at 10:30 AM with Laban Emperor, NP. She was also told to go to the ED if she worsens tonight. She is aware that she should bring meds and someone with her to the office appt.

## 2011-12-03 ENCOUNTER — Ambulatory Visit (INDEPENDENT_AMBULATORY_CARE_PROVIDER_SITE_OTHER): Payer: Medicare Other | Admitting: Gastroenterology

## 2011-12-03 ENCOUNTER — Encounter: Payer: Self-pay | Admitting: Gastroenterology

## 2011-12-03 VITALS — BP 105/62 | HR 60 | Temp 97.2°F | Ht 63.0 in | Wt 140.0 lb

## 2011-12-03 DIAGNOSIS — R195 Other fecal abnormalities: Secondary | ICD-10-CM | POA: Diagnosis not present

## 2011-12-03 DIAGNOSIS — K838 Other specified diseases of biliary tract: Secondary | ICD-10-CM

## 2011-12-03 DIAGNOSIS — R109 Unspecified abdominal pain: Secondary | ICD-10-CM | POA: Diagnosis not present

## 2011-12-03 DIAGNOSIS — R634 Abnormal weight loss: Secondary | ICD-10-CM

## 2011-12-03 NOTE — Patient Instructions (Signed)
Please complete stool sample. When the results are available, we will know how to proceed. If it is negative, we will likely be setting you up for a test to check for any issues with your small intestine.  We will see you back in 6 weeks.

## 2011-12-03 NOTE — Progress Notes (Signed)
Referring Provider: [name], * Primary Care Physician:  [name], FNP, FNP Primary Gastroenterologist: Dr. Oneida Alar   Chief Complaint  Patient presents with  . Abdominal Pain  . Bloated    HPI:   Presents today in f/u. Continues to complain of  abdominal pain. Has had EGD/TCS. CT Jan 2013 with patent mesenteric vasculature. No need for CTA as previously recommended. Complained of diarrhea at visit in March. Has not completed Cdiff as requested. Referred to [facility] for chronically dilated CBD, normal LFTs. Unable to have MRI secondary to pacemaker. Brought EUS report with her today. Findings: 48mm CBD, pancreatic duct non-dilated, 2.3 mm head pancreatic duct measurement, non-specific findings. Requested full notes from consult.  Issues with wt loss. Today 140, actually up 1 lb since March. Wt 147 at initial consult with [place] on Feb 2013.   Still needs to obtain H.pylori stool antigen, but she was started again on abx for sinus infection by PCP. Notes constipation, takes Miralax as needed, yet has watery stool with each dose. Lower abdominal pain, constant, abdominal bloating, gurgling. Goes 12-16 hours without eating. Doesn't want to eat. States depressed at times. No suicidal ideation. No urinary burning, frequency, odor.    Past Medical History  Diagnosis Date  . Permanent atrial fibrillation     Tachybradycardia syndrome on Coumadin anticoagulation  . CAD (coronary artery disease) 2011    Catheterization August 2011 mild nonobstructive coronary artery disease complicated by large left rectus sheath hematoma status post evacuation Coumadin restarted without recurrence  . Diabetes mellitus   . Chronic airway obstruction, not elsewhere classified   . Type II or unspecified type diabetes mellitus without mention of complication, not stated as uncontrolled   . Anemia, unspecified   . Chronic kidney disease, stage II (mild)   . Unspecified hypertensive kidney disease  with chronic kidney disease stage I through stage IV, or unspecified   . Tachycardia-bradycardia     s/p PPM by Dr Caryl Comes  . GERD (gastroesophageal reflux disease)   . Abnormal CT of the chest     Mediastinal and hilar adenopathy followed by Dr. Koleen Nimrod in [place]  . Congestive heart failure, unspecified     EF now 60%, previous nonischemic cardiomyopathy  . Pacemaker     Single chamber [place]. Jude ACCENT [phone] SN [id number] 04/11/1957 10/31/2010  . Mitral stenosis     Felt to be moderate by catheterization, but TEE only show mild mitral stenosis. Also no significant mitral regurgitation.  . Hypercholesterolemia   . Diverticulitis Dec 2012    Past Surgical History  Procedure Date  . Single-chamber pacemaker implantation 3/12    by Dr Caryl Comes  . Evacuation of retroperitoneal and left rectus sheath   . Cholecystectomy     40+ years ago  . Hernia repair     X3  . Pilonidal cyst / sinus excision   . Esophagogastroduodenoscopy 10/2011    erosive gastritis, biopsy with no H. pylori, hiatal hernia, peptic duodenitis, no celiac disease, duodenal diverticulum  . Colonoscopy 10/2011    severe sigmoid and descending diverticulosis, internal hemorrhoids    Current Outpatient Prescriptions  Medication Sig Dispense Refill  . ciprofloxacin (CIPRO) 500 MG tablet Take 500 mg by mouth 2 (two) times daily.      . clonazePAM (KLONOPIN) 0.5 MG tablet Take 0.5 mg by mouth 3 (three) times daily as needed. Future refills are to come from PCP      . digoxin (LANOXIN) 0.125 MG tablet Take 1 tablet (125 mcg  total) by mouth daily.  30 tablet  9  . diltiazem (CARTIA XT) 240 MG 24 hr capsule Take 1 capsule (240 mg total) by mouth daily.  30 capsule  6  . docusate sodium (COLACE) 100 MG capsule Take 100 mg by mouth 2 (two) times daily as needed. For constipation       . Fluticasone-Salmeterol (ADVAIR DISKUS) 250-50 MCG/DOSE AEPB Inhale 1 puff into the lungs every 12 (twelve) hours.        . furosemide (LASIX) 40 MG tablet  Take 40 mg by mouth daily.        [name] glyBURIDE (DIABETA) 5 MG tablet Take 5 mg by mouth daily with breakfast.       . HYDROcodone-acetaminophen (NORCO) 5-325 MG per tablet Take 1 tablet by mouth every 6 (six) hours as needed. For pain       . metoprolol tartrate (LOPRESSOR) 25 MG tablet Take 1 tablet (25 mg total) by mouth 2 (two) times daily.  60 tablet  6  . OVER THE COUNTER MEDICATION Place 1 patch onto the skin as needed. "Pain patch from [place]" only uses when needed for hip pain       . polyethylene glycol (MIRALAX / GLYCOLAX) packet Take 17 g by mouth daily as needed. For constipation      . PROAIR HFA 108 (90 BASE) MCG/ACT inhaler Take 2 puffs by mouth Every 6 hours as needed. For shortness of breath      . simvastatin (ZOCOR) 10 MG tablet Take 1 tablet (10 mg total) by mouth at bedtime.  30 tablet  6  . sucralfate (CARAFATE) 1 GM/10ML suspension Take 10 mLs (1 g total) by mouth 4 (four) times daily. Take before meals and at bedtime for 10 days.  420 mL  0  . tiotropium (SPIRIVA) 18 MCG inhalation capsule Place 18 mcg into inhaler and inhale daily.        [name] warfarin (COUMADIN) 1 MG tablet Take 1 tablet (1 mg total) by mouth daily.      . zafirlukast (ACCOLATE) 20 MG tablet Take 20 mg by mouth 2 (two) times daily.        . clonazePAM (KLONOPIN) 0.5 MG tablet Take 1 tablet (0.5 mg total) by mouth 2 (two) times daily.  60 tablet  1  . omeprazole (PRILOSEC) 40 MG capsule Take 40 mg by mouth daily.        . Probiotic Product (ALIGN) 4 MG CAPS Take 4 mg by mouth daily.  21 capsule  0    Allergies as of 12/03/2011 - Review Complete 12/03/2011  Allergen Reaction Noted  . Tramadol Nausea Only 12/03/2010    Family History  Problem Relation Age of Onset  . Cancer Mother     Uterus  . Cancer Daughter     kidney cancer, in remission  . Heart disease Father   . Colon cancer Neg Hx     History   Social History  . Marital Status: Single    Spouse Name: N/A    Number of Children: 2  .  Years of Education: N/A   Occupational History  . RETIRED    Social History Main Topics  . Smoking status: Former Smoker -- 2.0 packs/day for 45 years    Types: Cigarettes    Quit date: 08/10/2006  . Smokeless tobacco: Never Used  . Alcohol Use: No  . Drug Use: No  . Sexually Active: None   Other Topics Concern  . None  Social History Narrative  . None    Review of Systems: Gen: SEE HPI  CV: Denies chest pain, palpitations, syncope, peripheral edema, and claudication. Resp: Denies dyspnea at rest, cough, wheezing, coughing up blood, and pleurisy. GI: SEE HPI. Derm: Denies rash, itching, dry skin Psych: + DEPRESSION.  No homicidal or suicidal ideation.  Heme: Denies bruising, bleeding, and enlarged lymph nodes.  Physical Exam: BP 105/62  Pulse 60  Temp(Src) 97.2 F (36.2 C) (Temporal)  Ht 5\' 3"  (1.6 m)  Wt 140 lb (63.504 kg)  BMI 24.80 kg/m2 General:   Alert and oriented. No distress noted. Pleasant and cooperative.  Head:  Normocephalic and atraumatic. Eyes:  Conjuctiva clear without scleral icterus. Mouth:  Oral mucosa pink and moist.  Heart:  S1, S2 present without murmurs, rubs, or gallops. Regular rate and rhythm. Abdomen:  +BS, soft, non-tender and non-distended. No rebound or guarding. Ventral hernia noted.  Msk:  Symmetrical without gross deformities. Normal posture. Extremities:  Without edema. Neurologic:  Alert and  oriented x4;  grossly normal neurologically. Skin:  Intact without significant lesions or rashes. Cervical Nodes:  No significant cervical adenopathy. Psych:  Alert and cooperative. Normal mood and affect.

## 2011-12-06 DIAGNOSIS — R195 Other fecal abnormalities: Secondary | ICD-10-CM | POA: Insufficient documentation

## 2011-12-06 NOTE — Assessment & Plan Note (Signed)
Stable, up 1 lb from March visit. Question underlying depression. Will perform HBT for SIBO after Cdiff PCR obtained.

## 2011-12-06 NOTE — Assessment & Plan Note (Addendum)
Prior issues with constipation, yet reported postprandial loose stools at March visit. Did not complete Cdiff. Returns today with intermittent loose stools, usually after taking Miralax. Baseline is constipation. Pt is poor historian, so unclear exactly what is occurring. Will obtain Cdiff PCR for completeness.   6 week f/u.

## 2011-12-06 NOTE — Assessment & Plan Note (Signed)
EUS with non-significant findings. Normal LFTs. Requested [facility] consult note for our review.

## 2011-12-06 NOTE — Assessment & Plan Note (Signed)
Lower abdominal pain, constant. EGD/TCS as outlined in [facility]. Still needs H.pylori stool antigen, but she is currently on abx from PCP. Will need to hold off on this for now. +ventral hernia. Question chronic abdominal pain secondary to hernia, adhesive disease. Pt also states she is depressed but does not go into detail. Mesenteric vasculature patent on CT Jan 2013, no need for CTA as would likely not lend any additional findings. After completing Cdiff, will set up for HBT for SIBO.

## 2011-12-07 NOTE — Progress Notes (Signed)
Quick Note:  LMOM to call. ______ 

## 2011-12-07 NOTE — Progress Notes (Signed)
Faxed to PCP

## 2011-12-07 NOTE — Progress Notes (Signed)
Quick Note:  Negative Cdiff.  Need to set up patient for hydrogen breath test to assess for small bowel bacterial overgrowth. ______

## 2011-12-08 ENCOUNTER — Encounter: Payer: Self-pay | Admitting: Gastroenterology

## 2011-12-08 ENCOUNTER — Other Ambulatory Visit: Payer: Self-pay | Admitting: Gastroenterology

## 2011-12-08 DIAGNOSIS — R140 Abdominal distension (gaseous): Secondary | ICD-10-CM

## 2011-12-09 ENCOUNTER — Ambulatory Visit: Payer: Medicare Other | Admitting: Gastroenterology

## 2011-12-09 NOTE — Progress Notes (Signed)
Quick Note:  Great.  I have sent a note to the [place] [facility] [place] about test in June, as I won't be here.  We will plan on obtaining H.pylori stool antigen at this time as well, since she will be off PPI X 2 weeks already. ______

## 2011-12-10 ENCOUNTER — Ambulatory Visit: Payer: Medicare Other | Admitting: Gastroenterology

## 2011-12-10 DIAGNOSIS — E119 Type 2 diabetes mellitus without complications: Secondary | ICD-10-CM | POA: Diagnosis not present

## 2011-12-10 DIAGNOSIS — L851 Acquired keratosis [keratoderma] palmaris et plantaris: Secondary | ICD-10-CM | POA: Diagnosis not present

## 2011-12-10 DIAGNOSIS — L609 Nail disorder, unspecified: Secondary | ICD-10-CM | POA: Diagnosis not present

## 2011-12-10 DIAGNOSIS — E1159 Type 2 diabetes mellitus with other circulatory complications: Secondary | ICD-10-CM | POA: Diagnosis not present

## 2011-12-15 DIAGNOSIS — I4891 Unspecified atrial fibrillation: Secondary | ICD-10-CM | POA: Diagnosis not present

## 2011-12-15 DIAGNOSIS — J441 Chronic obstructive pulmonary disease with (acute) exacerbation: Secondary | ICD-10-CM | POA: Diagnosis not present

## 2011-12-15 DIAGNOSIS — E559 Vitamin D deficiency, unspecified: Secondary | ICD-10-CM | POA: Diagnosis not present

## 2011-12-16 ENCOUNTER — Telehealth: Payer: Self-pay

## 2011-12-16 NOTE — Telephone Encounter (Signed)
Pt called and said that she would like to have another prescription of the Carafate sent to her pharmacy. She said her stomach is quivering today. She hasn't had a good BM for 2-3 days. She took some Miralax last night and has had a little Bm today. Please advise on the Carafate!

## 2011-12-16 NOTE — Telephone Encounter (Signed)
Let's hold off on Carafate for right now.  She may take Miralax BID if needed for constipation.

## 2011-12-17 NOTE — Progress Notes (Signed)
AWAIT SIBO. CONSIDER ADDING REMERON QHS.

## 2011-12-17 NOTE — Telephone Encounter (Signed)
Called and informed pt.  

## 2011-12-19 ENCOUNTER — Emergency Department (HOSPITAL_COMMUNITY): Payer: Medicare Other

## 2011-12-19 ENCOUNTER — Encounter (HOSPITAL_COMMUNITY): Payer: Self-pay | Admitting: *Deleted

## 2011-12-19 ENCOUNTER — Emergency Department (HOSPITAL_COMMUNITY)
Admission: EM | Admit: 2011-12-19 | Discharge: 2011-12-19 | Disposition: A | Discharge status: Home / Self Care | Payer: Medicare Other | Attending: Emergency Medicine | Admitting: Emergency Medicine

## 2011-12-19 DIAGNOSIS — K219 Gastro-esophageal reflux disease without esophagitis: Secondary | ICD-10-CM | POA: Insufficient documentation

## 2011-12-19 DIAGNOSIS — R51 Headache: Secondary | ICD-10-CM | POA: Insufficient documentation

## 2011-12-19 DIAGNOSIS — I2510 Atherosclerotic heart disease of native coronary artery without angina pectoris: Secondary | ICD-10-CM | POA: Diagnosis not present

## 2011-12-19 DIAGNOSIS — Z950 Presence of cardiac pacemaker: Secondary | ICD-10-CM | POA: Insufficient documentation

## 2011-12-19 DIAGNOSIS — N390 Urinary tract infection, site not specified: Secondary | ICD-10-CM | POA: Diagnosis not present

## 2011-12-19 DIAGNOSIS — N182 Chronic kidney disease, stage 2 (mild): Secondary | ICD-10-CM | POA: Insufficient documentation

## 2011-12-19 DIAGNOSIS — R61 Generalized hyperhidrosis: Secondary | ICD-10-CM | POA: Insufficient documentation

## 2011-12-19 DIAGNOSIS — E7800 Pure hypercholesterolemia, unspecified: Secondary | ICD-10-CM | POA: Diagnosis not present

## 2011-12-19 DIAGNOSIS — E119 Type 2 diabetes mellitus without complications: Secondary | ICD-10-CM | POA: Diagnosis not present

## 2011-12-19 DIAGNOSIS — R109 Unspecified abdominal pain: Secondary | ICD-10-CM | POA: Insufficient documentation

## 2011-12-19 DIAGNOSIS — R110 Nausea: Secondary | ICD-10-CM | POA: Diagnosis not present

## 2011-12-19 LAB — COMPREHENSIVE METABOLIC PANEL
AST: 18 U/L (ref 0–37)
Albumin: 3.8 g/dL (ref 3.5–5.2)
BUN: 39 mg/dL — ABNORMAL HIGH (ref 6–23)
Calcium: 10.1 mg/dL (ref 8.4–10.5)
Chloride: 99 mEq/L (ref 96–112)
Creatinine, Ser: 1.52 mg/dL — ABNORMAL HIGH (ref 0.50–1.10)
Total Bilirubin: 0.4 mg/dL (ref 0.3–1.2)

## 2011-12-19 LAB — DIFFERENTIAL
Basophils Absolute: 0 10*3/uL (ref 0.0–0.1)
Basophils Relative: 0 % (ref 0–1)
Eosinophils Absolute: 0.1 10*3/uL (ref 0.0–0.7)
Eosinophils Relative: 1 % (ref 0–5)
Monocytes Absolute: 0.6 10*3/uL (ref 0.1–1.0)
Monocytes Relative: 7 % (ref 3–12)

## 2011-12-19 LAB — CBC
HCT: 34.3 % — ABNORMAL LOW (ref 36.0–46.0)
Hemoglobin: 11.2 g/dL — ABNORMAL LOW (ref 12.0–15.0)
MCH: 30.9 pg (ref 26.0–34.0)
MCHC: 32.7 g/dL (ref 30.0–36.0)
MCV: 94.8 fL (ref 78.0–100.0)
RDW: 13.4 % (ref 11.5–15.5)

## 2011-12-19 LAB — URINALYSIS, ROUTINE W REFLEX MICROSCOPIC
Glucose, UA: NEGATIVE mg/dL
Ketones, ur: NEGATIVE mg/dL
Nitrite: NEGATIVE
Protein, ur: NEGATIVE mg/dL
pH: 5.5 (ref 5.0–8.0)

## 2011-12-19 LAB — LIPASE, BLOOD: Lipase: 40 U/L (ref 11–59)

## 2011-12-19 LAB — URINE MICROSCOPIC-ADD ON

## 2011-12-19 MED ORDER — HYDROMORPHONE HCL PF 1 MG/ML IJ SOLN
0.5000 mg | Freq: Once | INTRAMUSCULAR | Status: AC
  Administered 2011-12-19: 0.5 mg via INTRAVENOUS
  Filled 2011-12-19: qty 1

## 2011-12-19 MED ORDER — SODIUM CHLORIDE 0.9 % IV BOLUS (SEPSIS)
500.0000 mL | Freq: Once | INTRAVENOUS | Status: AC
  Administered 2011-12-19: 18:00:00 via INTRAVENOUS

## 2011-12-19 MED ORDER — CEPHALEXIN 500 MG PO CAPS: 500.0000 mg | ORAL_CAPSULE | Freq: Three times a day (TID) | ORAL | Status: AC

## 2011-12-19 MED ORDER — SODIUM CHLORIDE 0.9 % IV SOLN: INTRAVENOUS | Status: DC

## 2011-12-19 MED ORDER — DEXTROSE 5 % IV SOLN
1.0000 g | Freq: Once | INTRAVENOUS | Status: AC
  Administered 2011-12-19: 1 g via INTRAVENOUS
  Filled 2011-12-19: qty 10

## 2011-12-19 MED ORDER — ONDANSETRON HCL 4 MG/2ML IJ SOLN
4.0000 mg | Freq: Once | INTRAMUSCULAR | Status: AC
  Administered 2011-12-19: 4 mg via INTRAVENOUS
  Filled 2011-12-19: qty 2

## 2011-12-19 NOTE — ED Provider Notes (Signed)
History  This chart was scribed for Nat Christen, MD by Jenne Campus. This patient was seen in room APA19/APA19 and the patient's care was started at 4:12PM.  CSN: [id number]  Arrival date & time 12/19/11  1332   First MD Initiated Contact with Patient 12/19/11 1612      Chief Complaint  Patient presents with  . Abdominal Pain     The history is provided by the patient. No language interpreter was used.    [name] is a 77 y.o. female who presents to the Emergency Department complaining of 12 hours of bilateral mid-abdominal pain described as a "quivering" sensation with associated cold sweats. She did not eat today. She denies any modifying factors. She has not tried any medications at home to improve symptoms. She reports similar episodes for the past 6 months but states that the pain today is worse than usual. She states that she was advised to be evaluated in the ED by her GI Doctor. She states that she had her proenzyme checked by her PCP last week and was told that she "could have an infection". She reports that she just finished antibiotic for cough last week as well. She also c/o HA with associated nausea that started while in the waiting room in this ED. She denies emesis, diarrhea, rash, dysuria and chest pain as associated symptoms. She has a h/o diverticulitis, CAD, diabetes, stage 2 kidney disease and CHF. She is a former smoker but denies alcohol use.  Dr. Oneida Alar is her GI.  Dr. [name] is her PCP.   Past Medical History  Diagnosis Date  . Permanent atrial fibrillation     Tachybradycardia syndrome on Coumadin anticoagulation  . CAD (coronary artery disease) 2011    Catheterization August 2011 mild nonobstructive coronary artery disease complicated by large left rectus sheath hematoma status post evacuation Coumadin restarted without recurrence  . Diabetes mellitus   . Chronic airway obstruction, not elsewhere classified   . Type II or unspecified type  diabetes mellitus without mention of complication, not stated as uncontrolled   . Anemia, unspecified   . Chronic kidney disease, stage II (mild)   . Unspecified hypertensive kidney disease with chronic kidney disease stage I through stage IV, or unspecified   . Tachycardia-bradycardia     s/p PPM by Dr Caryl Comes  . GERD (gastroesophageal reflux disease)   . Abnormal CT of the chest     Mediastinal and hilar adenopathy followed by Dr. Koleen Nimrod in [place]  . Congestive heart failure, unspecified     EF now 60%, previous nonischemic cardiomyopathy  . Pacemaker     Single chamber [place]. Jude ACCENT [phone] SN 719 04/11/1959 10/31/2010  . Mitral stenosis     Felt to be moderate by catheterization, but TEE only show mild mitral stenosis. Also no significant mitral regurgitation.  . Hypercholesterolemia   . Diverticulitis Dec 2012    Past Surgical History  Procedure Date  . Single-chamber pacemaker implantation 3/12    by Dr Caryl Comes  . Evacuation of retroperitoneal and left rectus sheath   . Cholecystectomy     40+ years ago  . Hernia repair     X3  . Pilonidal cyst / sinus excision   . Esophagogastroduodenoscopy 10/2011    erosive gastritis, biopsy with no H. pylori, hiatal hernia, peptic duodenitis, no celiac disease, duodenal diverticulum  . Colonoscopy 10/2011    severe sigmoid and descending diverticulosis, internal hemorrhoids    Family History  Problem Relation Age of  Onset  . Cancer Mother     Uterus  . Cancer Daughter     kidney cancer, in remission  . Heart disease Father   . Colon cancer Neg Hx     History  Substance Use Topics  . Smoking status: Former Smoker -- 2.0 packs/day for 45 years    Types: Cigarettes    Quit date: 08/10/2006  . Smokeless tobacco: Never Used  . Alcohol Use: No     Review of Systems  A complete 10 system review of systems was obtained and all systems are negative except as noted in the HPI and PMH.   Allergies  Tramadol  Home Medications     Current Outpatient Rx  Name Route Sig Dispense Refill  . CIPROFLOXACIN HCL 500 MG PO TABS Oral Take 500 mg by mouth 2 (two) times daily.    [name] CLONAZEPAM 0.5 MG PO TABS Oral Take 1 tablet (0.5 mg total) by mouth 2 (two) times daily. 60 tablet 1  . CLONAZEPAM 0.5 MG PO TABS Oral Take 0.5 mg by mouth 3 (three) times daily as needed. Future refills are to come from PCP    . DIGOXIN 0.125 MG PO TABS Oral Take 1 tablet (125 mcg total) by mouth daily. 30 tablet 9  . DILTIAZEM HCL ER COATED BEADS 240 MG PO CP24 Oral Take 1 capsule (240 mg total) by mouth daily. 30 capsule 6  . DOCUSATE SODIUM 100 MG PO CAPS Oral Take 100 mg by mouth 2 (two) times daily as needed. For constipation     . FLUTICASONE-SALMETEROL 250-50 MCG/DOSE IN AEPB Inhalation Inhale 1 puff into the lungs every 12 (twelve) hours.      . FUROSEMIDE 40 MG PO TABS Oral Take 40 mg by mouth daily.      . GLYBURIDE 5 MG PO TABS Oral Take 5 mg by mouth daily with breakfast.     . HYDROCODONE-ACETAMINOPHEN 5-325 MG PO TABS Oral Take 1 tablet by mouth every 6 (six) hours as needed. For pain     . METOPROLOL TARTRATE 25 MG PO TABS Oral Take 1 tablet (25 mg total) by mouth 2 (two) times daily. 60 tablet 6  . OMEPRAZOLE 40 MG PO CPDR Oral Take 40 mg by mouth daily.      [name] OVER THE COUNTER MEDICATION Transdermal Place 1 patch onto the skin as needed. "Pain patch from [place]" only uses when needed for hip pain     . POLYETHYLENE GLYCOL 3350 PO PACK Oral Take 17 g by mouth daily as needed. For constipation    . PROAIR HFA 108 (90 BASE) MCG/ACT IN AERS Oral Take 2 puffs by mouth Every 6 hours as needed. For shortness of breath    . ALIGN 4 MG PO CAPS Oral Take 4 mg by mouth daily. 21 capsule 0  . SIMVASTATIN 10 MG PO TABS Oral Take 1 tablet (10 mg total) by mouth at bedtime. 30 tablet 6  . SUCRALFATE 1 GM/10ML PO SUSP Oral Take 10 mLs (1 g total) by mouth 4 (four) times daily. Take before meals and at bedtime for 10 days. 420 mL 0  . TIOTROPIUM  BROMIDE MONOHYDRATE 18 MCG IN CAPS Inhalation Place 18 mcg into inhaler and inhale daily.      . WARFARIN SODIUM 1 MG PO TABS Oral Take 1 tablet (1 mg total) by mouth daily.    [name] ZAFIRLUKAST 20 MG PO TABS Oral Take 20 mg by mouth 2 (two) times daily.  Triage Vitals: BP 110/48  Pulse 60  Temp 98 F (36.7 C)  Resp 20  Ht 5\' 3"  (1.6 m)  Wt 138 lb (62.596 kg)  BMI 24.45 kg/m2  SpO2 98%  Physical Exam  Nursing note and vitals reviewed. Constitutional: She is oriented to person, place, and time. She appears well-developed and well-nourished.  HENT:  Head: Normocephalic and atraumatic.  Eyes: Conjunctivae and EOM are normal. Pupils are equal, round, and reactive to light.  Neck: Normal range of motion. Neck supple.  Cardiovascular: Normal rate and regular rhythm.   Pulmonary/Chest: Effort normal and breath sounds normal.  Abdominal: Soft. Bowel sounds are normal. There is no tenderness.  Musculoskeletal: Normal range of motion. She exhibits no edema.  Neurological: She is alert and oriented to person, place, and time.  Skin: Skin is warm and dry.       Well-healed midline scar on the abdomen from prior surgery  Psychiatric: She has a normal mood and affect. Her behavior is normal.    ED Course  Procedures (including critical care time)  DIAGNOSTIC STUDIES: Oxygen Saturation is 98% on room air, normal by my interpretation.    COORDINATION OF CARE: 5:03PM-Discussed pain medications, blood work and urinalysis with pt and pt agreed to plan.   Labs Reviewed  GLUCOSE, CAPILLARY - Abnormal; Notable for the following:    Glucose-Capillary 131 (*)    All other components within normal limits  CBC - Abnormal; Notable for the following:    RBC 3.62 (*)    Hemoglobin 11.2 (*)    HCT 34.3 (*)    All other components within normal limits  COMPREHENSIVE METABOLIC PANEL - Abnormal; Notable for the following:    Glucose, Bld 122 (*)    BUN 39 (*)    Creatinine, Ser 1.52 (*)     GFR calc non Af Amer 33 (*)    GFR calc Af Amer 38 (*)    All other components within normal limits  URINALYSIS, ROUTINE W REFLEX MICROSCOPIC - Abnormal; Notable for the following:    Leukocytes, UA MODERATE (*)    All other components within normal limits  URINE MICROSCOPIC-ADD ON - Abnormal; Notable for the following:    Squamous Epithelial / LPF FEW (*)    Bacteria, UA FEW (*)    All other components within normal limits  DIFFERENTIAL  LIPASE, BLOOD   Dg Abd Acute W/chest  12/19/2011  *RADIOLOGY REPORT*  Clinical Data: Abdominal pain.  Recent colonoscopy.  ACUTE ABDOMEN SERIES (ABDOMEN 2 VIEW & CHEST 1 VIEW)  Comparison: Chest two views 07/18/2011.  Findings: Single view chest demonstrates clear lungs and normal heart size.  Pacing device noted.  Two views of the abdomen show no free intraperitoneal air.  The bowel gas pattern is unremarkable.  Mesh from prior hernia repair is noted.  IMPRESSION: No acute finding.  Original Report Authenticated By: Arvid Right. Luther Parody, M.D.     No diagnosis found.    MDM  No acute abdomen on physical exam. Laboratory data shows a urinary tract infection. IV Rocephin given. Urine culture. Discharge home with Keflex      I personally performed the services described in this documentation, which was scribed in my presence. The recorded information has been reviewed and considered.    Nat Christen, MD 12/19/11 2043

## 2011-12-19 NOTE — ED Notes (Signed)
Abdominal pain onset last night  

## 2011-12-19 NOTE — Discharge Instructions (Signed)
Urine Culture Collection, Female  You will collect a sample of pee (urine) in a cup. Read the instructions below before beginning. If you have any questions, ask the nurse before you begin. Follow the instructions carefully. 1. Wash your hands with soap and water and dry them thoroughly.  2. Open the lid of the cup. Be careful not to touch the inside.  3. Clean the private (genital) area. 1. Sit over the toilet. Use the fingers of one hand to separate and hold open the folds of the skin in your private area.  2. Clean the pee (urinary) opening and surrounding area with the gauze, wiping from front to back. Throw away the gauze in the trash, not the toilet.  3. Repeat step "b"2 more times.  4. With the folds of skin still separated, pee a small amount into toilet. STOP, then pee into the cup. Fill the cup half way.  5. Put the lid on the cup tightly.  6. Wash your hands with soap and water.  7. If you were given a label, put the label on the cup.  8. Give the cup to the nurse.  Document Released: 07/09/2008 Document Revised: 07/16/2011 Document Reviewed: 07/09/2008 [facility] Patient Information 2012 [place].   You have a urinary tract infection. Antibiotics for 10 days. Increase fluids. Followup your primary care Dr.

## 2011-12-21 ENCOUNTER — Other Ambulatory Visit (HOSPITAL_COMMUNITY): Payer: Medicare Other

## 2011-12-22 LAB — URINE CULTURE
Colony Count: NO GROWTH
Culture: NO GROWTH

## 2011-12-30 DIAGNOSIS — I4891 Unspecified atrial fibrillation: Secondary | ICD-10-CM | POA: Diagnosis not present

## 2011-12-31 ENCOUNTER — Encounter: Payer: Self-pay | Admitting: Gastroenterology

## 2012-01-05 ENCOUNTER — Telehealth: Payer: Self-pay | Admitting: Gastroenterology

## 2012-01-05 DIAGNOSIS — K5732 Diverticulitis of large intestine without perforation or abscess without bleeding: Secondary | ICD-10-CM | POA: Diagnosis not present

## 2012-01-05 NOTE — Telephone Encounter (Signed)
Recommend OV with [facility]F only, E30 please.

## 2012-01-05 NOTE — Telephone Encounter (Signed)
Pt called wanting to speak with [facility] or [facility] about her stomach issues. Please call patient back at [phone]

## 2012-01-05 NOTE — Telephone Encounter (Signed)
LMOM for pt to call.  [name] to schedule)

## 2012-01-05 NOTE — Telephone Encounter (Signed)
Called pt. She is concerned about a bubbling/gurgling sound in her stomach at times. Said she is still having some diarrhea, she will go several times a day, usually at night, but she does not do much. She still has a lot of abdominal bloating. Said she is eating good. She is taking the Align, but not the Miralax since she is having the diarrhea.  Please advise!

## 2012-01-05 NOTE — Telephone Encounter (Signed)
Called pt to offer OV with SF on 6/26, but she would rather wait until she sees her PCP and will call me back.

## 2012-01-12 ENCOUNTER — Ambulatory Visit (HOSPITAL_COMMUNITY): Payer: Medicare Other

## 2012-01-12 ENCOUNTER — Telehealth: Payer: Self-pay | Admitting: Gastroenterology

## 2012-01-12 NOTE — Telephone Encounter (Signed)
Called and informed pt.  

## 2012-01-12 NOTE — Telephone Encounter (Signed)
Patient called to cancel her NM test for the second time, she is stating she is back on antibiotics again.

## 2012-01-12 NOTE — Telephone Encounter (Signed)
REVIEWED.  

## 2012-01-12 NOTE — Telephone Encounter (Signed)
PLEASE CALL PT. SHE SHOULD CONTACT RADIOLOLOGY TO [facility] HER TEST when she is off ABX.

## 2012-01-13 ENCOUNTER — Telehealth: Payer: Self-pay | Admitting: Gastroenterology

## 2012-01-13 ENCOUNTER — Other Ambulatory Visit: Payer: Self-pay

## 2012-01-13 DIAGNOSIS — R197 Diarrhea, unspecified: Secondary | ICD-10-CM

## 2012-01-13 NOTE — Telephone Encounter (Signed)
Informed pt. She has marked her calendar that 02/15/2012 will be 30 days after her antibiotics. She will stop the PPI 2 weeks prior. New H. Pylori Stool instructions and bottle at front for pt.

## 2012-01-13 NOTE — Telephone Encounter (Signed)
Please find out if patient is off PPI and has been off Antibiotics for at least two weeks. If so, have her complete H.Pylori stool antigen test (AS requested me f/u on this.

## 2012-01-13 NOTE — Telephone Encounter (Signed)
Per initial instructions by Vicente Males. Patient has to be off antibiotics for 30 days before NM urea test. She can stop PPI two weeks before NM urea test. Have NM test done and H.Pylori stool ag test done at same time.

## 2012-01-13 NOTE — Telephone Encounter (Signed)
Called pt. She is on antibiotics, Cipro and Flagyl for her stomach (said that's all she knew). She will complete on Friday. She has not been off of PPI. She is aware she will need to be off PPI 14 days prior to stool test. Does she need to wait few days after completing the antibiotics for NM or OK to do first of next week?

## 2012-01-14 DIAGNOSIS — D539 Nutritional anemia, unspecified: Secondary | ICD-10-CM | POA: Diagnosis not present

## 2012-01-14 DIAGNOSIS — I4891 Unspecified atrial fibrillation: Secondary | ICD-10-CM | POA: Diagnosis not present

## 2012-01-15 ENCOUNTER — Encounter: Payer: Self-pay | Admitting: Gastroenterology

## 2012-01-15 NOTE — Telephone Encounter (Signed)
Just make sure she gets rescheduled for NM urea test.

## 2012-01-18 NOTE — Telephone Encounter (Signed)
Routing to Darius Bump to reschedule.

## 2012-01-20 NOTE — Telephone Encounter (Signed)
Patient is scheduled for NM UBT for Tuesday 02/16/12 at 9:00 arrival time 8:45 and patient is aware to be NPO after midnight and no Pepto or stomach medications until after test is complete

## 2012-01-21 ENCOUNTER — Telehealth: Payer: Self-pay | Admitting: Urgent Care

## 2012-01-21 DIAGNOSIS — A048 Other specified bacterial intestinal infections: Secondary | ICD-10-CM

## 2012-01-21 NOTE — Telephone Encounter (Signed)
Laban Emperor, NP would like pt to have h pylori stool AG around the same day of HBT since she will be off PPI. Please arrange w/ pt.  Looks like [facility] has already placed the order. Thanks

## 2012-01-21 NOTE — Telephone Encounter (Signed)
Message copied by Andria Meuse on Thu Jan 21, 2012  1:51 PM ------      Message from: Danny Lawless D      Created: Tue Jan 12, 2012 12:48 PM       Pt is scheduled for breath test on 06/27- had to reschedule because pt has to be off antibiotics for 30 days

## 2012-01-21 NOTE — Telephone Encounter (Signed)
[facility] pt- routed to [facility].

## 2012-01-25 NOTE — Telephone Encounter (Signed)
Patient was scheduled for NM urea breath test incorrectly from initial Laban Emperor, NP order. Should have been Hydrogen Breath Test for small bowel bacterial overgrowth.   Please schedule HBT for SBBO with endo.

## 2012-01-25 NOTE — Telephone Encounter (Signed)
[name] has cx the NM test.  She has also contacted the patient, however she is waiting on the patient to return her call.  [name], please contact the patient again later this week.

## 2012-01-29 ENCOUNTER — Other Ambulatory Visit: Payer: Self-pay | Admitting: Gastroenterology

## 2012-01-29 NOTE — Telephone Encounter (Signed)
Spoke to pt. She is aware of the appt. She is also aware that she is to do the H. Pylori stool antigen. She has stopped PPI.

## 2012-01-29 NOTE — Telephone Encounter (Signed)
Patient is scheduled for HBT in Endo on 02/16/12 at 7:30 am arrive at 7:00 am and instructions were mailed to the patient and patient is aware

## 2012-02-02 ENCOUNTER — Encounter (HOSPITAL_COMMUNITY): Payer: Self-pay | Admitting: Pharmacy Technician

## 2012-02-02 DIAGNOSIS — J811 Chronic pulmonary edema: Secondary | ICD-10-CM | POA: Diagnosis not present

## 2012-02-02 DIAGNOSIS — M25569 Pain in unspecified knee: Secondary | ICD-10-CM | POA: Diagnosis not present

## 2012-02-02 DIAGNOSIS — M1710 Unilateral primary osteoarthritis, unspecified knee: Secondary | ICD-10-CM | POA: Diagnosis not present

## 2012-02-02 DIAGNOSIS — J440 Chronic obstructive pulmonary disease with acute lower respiratory infection: Secondary | ICD-10-CM | POA: Diagnosis not present

## 2012-02-02 DIAGNOSIS — E119 Type 2 diabetes mellitus without complications: Secondary | ICD-10-CM | POA: Diagnosis not present

## 2012-02-02 DIAGNOSIS — R791 Abnormal coagulation profile: Secondary | ICD-10-CM | POA: Diagnosis not present

## 2012-02-02 DIAGNOSIS — M25469 Effusion, unspecified knee: Secondary | ICD-10-CM | POA: Diagnosis not present

## 2012-02-02 DIAGNOSIS — J209 Acute bronchitis, unspecified: Secondary | ICD-10-CM | POA: Diagnosis not present

## 2012-02-02 DIAGNOSIS — Z79899 Other long term (current) drug therapy: Secondary | ICD-10-CM | POA: Diagnosis not present

## 2012-02-02 DIAGNOSIS — Z950 Presence of cardiac pacemaker: Secondary | ICD-10-CM | POA: Diagnosis not present

## 2012-02-02 DIAGNOSIS — Z7901 Long term (current) use of anticoagulants: Secondary | ICD-10-CM | POA: Diagnosis not present

## 2012-02-04 ENCOUNTER — Other Ambulatory Visit (HOSPITAL_COMMUNITY): Payer: Medicare Other

## 2012-02-05 DIAGNOSIS — D649 Anemia, unspecified: Secondary | ICD-10-CM | POA: Diagnosis not present

## 2012-02-05 DIAGNOSIS — I4891 Unspecified atrial fibrillation: Secondary | ICD-10-CM | POA: Diagnosis not present

## 2012-02-08 DIAGNOSIS — M1710 Unilateral primary osteoarthritis, unspecified knee: Secondary | ICD-10-CM | POA: Diagnosis not present

## 2012-02-12 DIAGNOSIS — N390 Urinary tract infection, site not specified: Secondary | ICD-10-CM | POA: Diagnosis not present

## 2012-02-12 DIAGNOSIS — I4891 Unspecified atrial fibrillation: Secondary | ICD-10-CM | POA: Diagnosis not present

## 2012-02-15 ENCOUNTER — Telehealth: Payer: Self-pay

## 2012-02-15 NOTE — Telephone Encounter (Signed)
REVIEWED.  

## 2012-02-15 NOTE — Telephone Encounter (Signed)
Dr. Oneida Alar this lady has cancelled this more than once please advise what to do??

## 2012-02-15 NOTE — Telephone Encounter (Signed)
Pt called to inform us that she will not be able to have the breath test done in the morning. She had to go to her PCP and they put her antibx. I told her that I would let them know and we would try to get her reschedule.

## 2012-02-16 ENCOUNTER — Encounter (HOSPITAL_COMMUNITY): Admission: RE | Payer: Self-pay | Source: Ambulatory Visit

## 2012-02-16 ENCOUNTER — Other Ambulatory Visit (HOSPITAL_COMMUNITY): Payer: Medicare Other

## 2012-02-16 ENCOUNTER — Ambulatory Visit (HOSPITAL_COMMUNITY): Admission: RE | Admit: 2012-02-16 | Payer: Medicare Other | Source: Ambulatory Visit | Admitting: Gastroenterology

## 2012-02-16 SURGERY — BREATH TEST, FOR INTESTINAL BACTERIAL OVERGROWTH

## 2012-02-16 NOTE — Telephone Encounter (Signed)
REVIEWED.  

## 2012-02-18 ENCOUNTER — Encounter: Payer: Medicare Other | Admitting: *Deleted

## 2012-02-18 DIAGNOSIS — L851 Acquired keratosis [keratoderma] palmaris et plantaris: Secondary | ICD-10-CM | POA: Diagnosis not present

## 2012-02-18 DIAGNOSIS — E1159 Type 2 diabetes mellitus with other circulatory complications: Secondary | ICD-10-CM | POA: Diagnosis not present

## 2012-02-22 ENCOUNTER — Telehealth: Payer: Self-pay | Admitting: Urgent Care

## 2012-02-22 DIAGNOSIS — I4891 Unspecified atrial fibrillation: Secondary | ICD-10-CM | POA: Diagnosis not present

## 2012-02-22 NOTE — Telephone Encounter (Signed)
Patient has been on antibiotics, therefore she has not completed HBT nor stool for H. Pylori. Discussed with Dr. Oneida Alar. 1st available OPV w/ AS.  Will need tests rescheduled at her convenience. Thanks

## 2012-02-23 ENCOUNTER — Encounter: Payer: Self-pay | Admitting: *Deleted

## 2012-02-23 ENCOUNTER — Telehealth: Payer: Self-pay | Admitting: *Deleted

## 2012-02-23 NOTE — Telephone Encounter (Signed)
Pt is aware of OV on 7/18 @ 10 with SF

## 2012-02-23 NOTE — Telephone Encounter (Signed)
Ms Vittone called today. She needed to cancel her appointment on Thursday because she has no transportation.  I was not able to reschedule her until Aug 22 because the patient cannot come for an 8:30 am appointment. She is suppose to see you for a 6 week follow up.Ms Kotowski was mentioning that she had stopped her recent medication last Friday.

## 2012-02-24 NOTE — Telephone Encounter (Signed)
Called pt. She is rescheduled for 03/31/2012 @ 11:00 AM with Dr. Oneida Alar and she is aware. She said she had finished the meds for the H. Pylori.

## 2012-02-25 ENCOUNTER — Ambulatory Visit: Payer: Medicare Other | Admitting: Gastroenterology

## 2012-02-25 NOTE — Telephone Encounter (Signed)
REVIEWED.  

## 2012-02-25 NOTE — Telephone Encounter (Signed)
Per Manuela Schwartz, pt already had appt w/ [facility] scheduled.

## 2012-02-29 DIAGNOSIS — I4891 Unspecified atrial fibrillation: Secondary | ICD-10-CM | POA: Diagnosis not present

## 2012-02-29 DIAGNOSIS — D649 Anemia, unspecified: Secondary | ICD-10-CM | POA: Diagnosis not present

## 2012-02-29 DIAGNOSIS — N390 Urinary tract infection, site not specified: Secondary | ICD-10-CM | POA: Diagnosis not present

## 2012-03-07 ENCOUNTER — Encounter: Payer: Self-pay | Admitting: Internal Medicine

## 2012-03-07 ENCOUNTER — Ambulatory Visit (INDEPENDENT_AMBULATORY_CARE_PROVIDER_SITE_OTHER): Payer: Medicare Other | Admitting: *Deleted

## 2012-03-07 DIAGNOSIS — I495 Sick sinus syndrome: Secondary | ICD-10-CM

## 2012-03-07 DIAGNOSIS — Z950 Presence of cardiac pacemaker: Secondary | ICD-10-CM | POA: Diagnosis not present

## 2012-03-08 DIAGNOSIS — N6459 Other signs and symptoms in breast: Secondary | ICD-10-CM | POA: Diagnosis not present

## 2012-03-08 DIAGNOSIS — Z09 Encounter for follow-up examination after completed treatment for conditions other than malignant neoplasm: Secondary | ICD-10-CM | POA: Diagnosis not present

## 2012-03-08 LAB — REMOTE PACEMAKER DEVICE
BMOD-0002RV: 8
BRDY-0002RV: 60 {beats}/min
BRDY-0005RV: 50 {beats}/min
DEVICE MODEL PM: 7199163

## 2012-03-08 LAB — HM MAMMOGRAPHY

## 2012-03-14 ENCOUNTER — Encounter: Payer: Self-pay | Admitting: *Deleted

## 2012-03-16 DIAGNOSIS — I4891 Unspecified atrial fibrillation: Secondary | ICD-10-CM | POA: Diagnosis not present

## 2012-03-30 DIAGNOSIS — I4891 Unspecified atrial fibrillation: Secondary | ICD-10-CM | POA: Diagnosis not present

## 2012-03-31 ENCOUNTER — Ambulatory Visit: Payer: Medicare Other | Admitting: Gastroenterology

## 2012-03-31 ENCOUNTER — Telehealth: Payer: Self-pay | Admitting: *Deleted

## 2012-03-31 NOTE — Telephone Encounter (Signed)
Pt was a no show

## 2012-03-31 NOTE — Telephone Encounter (Signed)
REVIEWED.   CALL PT TO [place].

## 2012-03-31 NOTE — Telephone Encounter (Signed)
Pt rescheduled

## 2012-03-31 NOTE — Progress Notes (Signed)
  Subjective:    Patient ID: [name], female    DOB: 1935/01/20, 77 y.o.   MRN: [id number]  PCP:   HPI error   Review of Systems     Objective:   Physical Exam        Assessment & Plan:

## 2012-04-02 DIAGNOSIS — J449 Chronic obstructive pulmonary disease, unspecified: Secondary | ICD-10-CM | POA: Diagnosis not present

## 2012-04-02 DIAGNOSIS — IMO0002 Reserved for concepts with insufficient information to code with codable children: Secondary | ICD-10-CM | POA: Diagnosis not present

## 2012-04-02 DIAGNOSIS — Z950 Presence of cardiac pacemaker: Secondary | ICD-10-CM | POA: Diagnosis not present

## 2012-04-02 DIAGNOSIS — R109 Unspecified abdominal pain: Secondary | ICD-10-CM | POA: Diagnosis not present

## 2012-04-02 DIAGNOSIS — K5732 Diverticulitis of large intestine without perforation or abscess without bleeding: Secondary | ICD-10-CM | POA: Diagnosis not present

## 2012-04-02 DIAGNOSIS — E119 Type 2 diabetes mellitus without complications: Secondary | ICD-10-CM | POA: Diagnosis not present

## 2012-04-02 DIAGNOSIS — Z79899 Other long term (current) drug therapy: Secondary | ICD-10-CM | POA: Diagnosis not present

## 2012-04-02 LAB — COMPREHENSIVE METABOLIC PANEL
Alkaline Phosphatase: 90 U/L
CO2: 28 mmol/L
Calcium: 9.5 mg/dL
Creat: 1.61
Lipase: 47 units/L (ref 0–53)
Sodium: 139 mmol/L (ref 137–147)
Total Protein: 7.7 g/dL

## 2012-04-02 LAB — CBC WITH DIFFERENTIAL/PLATELET: HCT: 36 %

## 2012-04-13 ENCOUNTER — Encounter: Payer: Self-pay | Admitting: Urgent Care

## 2012-04-13 ENCOUNTER — Ambulatory Visit (INDEPENDENT_AMBULATORY_CARE_PROVIDER_SITE_OTHER): Payer: Medicare Other | Admitting: Urgent Care

## 2012-04-13 VITALS — BP 90/56 | HR 60 | Temp 97.9°F | Ht 63.0 in | Wt 130.6 lb

## 2012-04-13 DIAGNOSIS — R109 Unspecified abdominal pain: Secondary | ICD-10-CM | POA: Diagnosis not present

## 2012-04-13 DIAGNOSIS — K5792 Diverticulitis of intestine, part unspecified, without perforation or abscess without bleeding: Secondary | ICD-10-CM

## 2012-04-13 DIAGNOSIS — K5732 Diverticulitis of large intestine without perforation or abscess without bleeding: Secondary | ICD-10-CM | POA: Diagnosis not present

## 2012-04-13 LAB — CBC WITH DIFFERENTIAL/PLATELET
Eosinophils Absolute: 0.2 10*3/uL (ref 0.0–0.7)
Eosinophils Relative: 2 % (ref 0–5)
HCT: 35.7 % — ABNORMAL LOW (ref 36.0–46.0)
Hemoglobin: 11.7 g/dL — ABNORMAL LOW (ref 12.0–15.0)
Lymphs Abs: 2.8 10*3/uL (ref 0.7–4.0)
MCH: 31.6 pg (ref 26.0–34.0)
MCV: 96.5 fL (ref 78.0–100.0)
Monocytes Absolute: 0.9 10*3/uL (ref 0.1–1.0)
Monocytes Relative: 8 % (ref 3–12)
Neutrophils Relative %: 66 % (ref 43–77)
RBC: 3.7 MIL/uL — ABNORMAL LOW (ref 3.87–5.11)

## 2012-04-13 LAB — CREATININE, SERUM: Creat: 1.47 mg/dL — ABNORMAL HIGH (ref 0.50–1.10)

## 2012-04-13 NOTE — Patient Instructions (Addendum)
CT of abdomen and pelvis with contrast at the hospital. [facility] call you with results. To ER if you have severe pain. Complete Cipro Flagyl and instructed You have Norco to use as needed for pain if needed Go to lab to get your blood work now

## 2012-04-13 NOTE — Progress Notes (Signed)
Referring Provider: [name], * Primary Care Physician:  [name], FNP Primary Gastroenterologist:  Dr. Barney Drain  Chief Complaint  Patient presents with  . ER follow up    diverticulitis    HPI:  [name] is a 77 y.o. female here for follow up for diverticulitis.  C/o 10 day hx Left-sided abd pain.  She went to [facility] ER & was started on cipro 500mg  BID & flagyl 500mg  BID for 10 days.  She was also given Lortab 5/500 mg every 4 hours when necessary pain. Her pain persists but not as bad.  4/10 .  Pain is constant.  Radiates to left hip.  She tells me she is very tender on the left side. She has 2 more doses of cipro/flagyl left.  C/o chills & feels like fever but hasn't checked her temperature.  She has vomited once a week ago after eating chicken noodle soup.  Denies rectal bleeding, melena or diarrhea.  No ill contacts.  She was able to 80 some eggs & toast for breakfast & cream of chicken soup for dinner.  04/02/12 labs reviewed. CMP shows AST 56, glucose 117, BUN 30, creatinine 1.61, otherwise normal. CBC normal.  Urinalysis showed small LE and bacteria. 04/02/12 CT abdomen pelvis without contrast-suspect mild or early colitis or diverticulitis of the sigmoid colon and proximal rectum. No abscess or complicating features. Otherwise normal.  Past Medical History  Diagnosis Date  . Permanent atrial fibrillation     Tachybradycardia syndrome on Coumadin anticoagulation  . CAD (coronary artery disease) 2011    Catheterization August 2011 mild nonobstructive coronary artery disease complicated by large left rectus sheath hematoma status post evacuation Coumadin restarted without recurrence  . Diabetes mellitus   . Chronic airway obstruction, not elsewhere classified   . Type II or unspecified type diabetes mellitus without mention of complication, not stated as uncontrolled   . Anemia, unspecified   . Chronic kidney disease, stage II (mild)   . Unspecified  hypertensive kidney disease with chronic kidney disease stage I through stage IV, or unspecified   . Tachycardia-bradycardia     s/p PPM by Dr Caryl Comes  . GERD (gastroesophageal reflux disease)   . Abnormal CT of the chest     Mediastinal and hilar adenopathy followed by Dr. Koleen Nimrod in [place]  . Congestive heart failure, unspecified     EF now 60%, previous nonischemic cardiomyopathy  . Pacemaker     Single chamber [place]. [employer] [phone] SN 719 04/11/1959 10/31/2010  . Mitral stenosis     Felt to be moderate by catheterization, but TEE only show mild mitral stenosis. Also no significant mitral regurgitation.  . Hypercholesterolemia   . Diverticulitis Dec 2012    Past Surgical History  Procedure Date  . Single-chamber pacemaker implantation 3/12    by Dr Caryl Comes  . Evacuation of retroperitoneal and left rectus sheath   . Cholecystectomy     40+ years ago  . Hernia repair     X3  . Pilonidal cyst / sinus excision   . Esophagogastroduodenoscopy 10/2011    Fields-erosive gastritis, biopsy with no H. pylori, hiatal hernia, peptic duodenitis, no celiac disease, duodenal diverticulum, duodenal nodule  . Colonoscopy 10/2011    Dyann Ruddle sigmoid to descending diverticulosis, internal hemorrhoids  . Eus 11/16/11    Mishra-13 MM CBD, normal pancreatic duct, otherwise normal EUS    Current Outpatient Prescriptions  Medication Sig Dispense Refill  . ciprofloxacin (CIPRO) 500 MG tablet Take 500 mg by  mouth 2 (two) times daily.      . digoxin (LANOXIN) 0.125 MG tablet Take 1 tablet (125 mcg total) by mouth daily.  30 tablet  9  . Fluticasone-Salmeterol (ADVAIR DISKUS) 250-50 MCG/DOSE AEPB Inhale 1 puff into the lungs every 12 (twelve) hours.        . furosemide (LASIX) 40 MG tablet Take 40 mg by mouth daily.        [name] glyBURIDE (DIABETA) 5 MG tablet Take 2.5 mg by mouth daily with breakfast.       . HYDROcodone-acetaminophen (NORCO) 5-325 MG per tablet Take 1 tablet by mouth every 6 (six) hours  as needed. For pain       . metoprolol tartrate (LOPRESSOR) 25 MG tablet Take 1 tablet (25 mg total) by mouth 2 (two) times daily.  60 tablet  6  . metroNIDAZOLE (FLAGYL) 250 MG tablet Take 250 mg by mouth 2 (two) times daily.      [name] omeprazole (PRILOSEC) 40 MG capsule Take 40 mg by mouth daily.        [name] OVER THE COUNTER MEDICATION Place 1 patch onto the skin as needed. "Pain patch from [employer]" only uses when needed for hip pain       . PROAIR HFA 108 (90 BASE) MCG/ACT inhaler Take 2 puffs by mouth Every 6 hours as needed. For shortness of breath      . simvastatin (ZOCOR) 10 MG tablet Take 1 tablet (10 mg total) by mouth at bedtime.  30 tablet  6  . tiotropium (SPIRIVA) 18 MCG inhalation capsule Place 18 mcg into inhaler and inhale daily.        [name] warfarin (COUMADIN) 1 MG tablet Take 1 mg by mouth daily. Pt taking 1 mg daily now      . zafirlukast (ACCOLATE) 20 MG tablet Take 20 mg by mouth 2 (two) times daily.        . clonazePAM (KLONOPIN) 0.5 MG tablet Take 0.5 mg by mouth 3 (three) times daily as needed.      . diltiazem (CARTIA XT) 240 MG 24 hr capsule Take 1 capsule (240 mg total) by mouth daily.  30 capsule  6    Allergies as of 04/13/2012 - Review Complete 04/13/2012  Allergen Reaction Noted  . Tramadol Nausea Only 12/03/2010    Review of Systems: Gen: c/o malaise and fatigue. CV: Denies chest pain, angina, palpitations, syncope, orthopnea, PND, peripheral edema, and claudication. Resp: Denies dyspnea at rest, dyspnea with exercise, cough, sputum, wheezing, coughing up blood, and pleurisy. GI: Denies vomiting blood, jaundice, and fecal incontinence.   Denies dysphagia or odynophagia. Derm: Denies rash, itching, dry skin, hives, moles, warts, or unhealing ulcers.  Psych: Denies depression, anxiety, memory loss, suicidal ideation, hallucinations, paranoia, and confusion. Heme: Denies bruising, bleeding, and enlarged lymph nodes.  Physical Exam: BP 90/56  Pulse 60  Temp 97.9 F  (36.6 C) (Temporal)  Ht 5\' 3"  (1.6 m)  Wt 130 lb 9.6 oz (59.24 kg)  BMI 23.13 kg/m2 General:   Alert,  Well-developed, well-nourished, pleasant and cooperative in NAD.  Granddaughter accompanied today. Eyes:  Sclera clear, no icterus.   Conjunctiva pink. Mouth:  No deformity or lesions, oropharynx pink and moist. Neck:  Supple; no masses or thyromegaly. Heart:  Regular rate and rhythm; 3/6 murmur noted.  Abdomen:  Normal bowel sounds.  No bruits.  Soft, non-distended.  +Very tender LLQ.  + rebound.  No masses, hepatosplenomegaly or hernias noted.  Rectal:  Deferred.  Msk:  Symmetrical without gross deformities.  Pulses:  Normal pulses noted. Extremities:  No edema. Neurologic:  Alert and oriented x4;  grossly normal neurologically. Skin:  Intact without significant lesions or rashes.

## 2012-04-14 ENCOUNTER — Other Ambulatory Visit (HOSPITAL_COMMUNITY): Payer: Medicare Other

## 2012-04-14 ENCOUNTER — Ambulatory Visit (HOSPITAL_COMMUNITY)
Admission: RE | Admit: 2012-04-14 | Discharge: 2012-04-14 | Disposition: A | Discharge status: Home / Self Care | Payer: Medicare Other | Source: Ambulatory Visit | Attending: Urgent Care | Admitting: Urgent Care

## 2012-04-14 DIAGNOSIS — Z9089 Acquired absence of other organs: Secondary | ICD-10-CM | POA: Diagnosis not present

## 2012-04-14 DIAGNOSIS — K5792 Diverticulitis of intestine, part unspecified, without perforation or abscess without bleeding: Secondary | ICD-10-CM | POA: Insufficient documentation

## 2012-04-14 DIAGNOSIS — R1032 Left lower quadrant pain: Secondary | ICD-10-CM | POA: Diagnosis not present

## 2012-04-14 DIAGNOSIS — E119 Type 2 diabetes mellitus without complications: Secondary | ICD-10-CM | POA: Insufficient documentation

## 2012-04-14 DIAGNOSIS — K838 Other specified diseases of biliary tract: Secondary | ICD-10-CM | POA: Insufficient documentation

## 2012-04-14 DIAGNOSIS — K5730 Diverticulosis of large intestine without perforation or abscess without bleeding: Secondary | ICD-10-CM | POA: Diagnosis not present

## 2012-04-14 MED ORDER — IOHEXOL 300 MG/ML  SOLN
80.0000 mL | Freq: Once | INTRAMUSCULAR | Status: AC | PRN
  Administered 2012-04-14: 80 mL via INTRAVENOUS

## 2012-04-14 NOTE — Assessment & Plan Note (Addendum)
Persistent left lower quadrant pain despite near completion of Cipro and Flagyl for sigmoid diverticulitis documented on CT scan. She is still quite tender and feels poorly. CT of abdomen and pelvis with IV and oral contrast to look for complicated diverticulitis, abscess, or perforation.    To ER if you have severe pain. Complete Cipro/Flagyl as instructed Patient has Norco to use as needed for pain CBC, creatinine to dose-adjust CT contrast, and INR given the fact she is on concomitant Coumadin and antibiotics Clear liquids

## 2012-04-14 NOTE — Progress Notes (Signed)
Faxed to PCP

## 2012-04-14 NOTE — Assessment & Plan Note (Signed)
See abd pain.  

## 2012-04-14 NOTE — Progress Notes (Signed)
Quick Note:  Await CT [id number] MARGARET, FNP  ______

## 2012-04-18 NOTE — Progress Notes (Signed)
Quick Note:  Please call pt. No further diverticulitis on this CT so antibiotics seem to have worked. It does show constipation. Begin Miralax 17 grams daily. Hold if diarrhea. OV in 6 weeks to FU diverticulitis, constipation, abdominal pain. Thanks [id number] MARGARET, FNP   ______

## 2012-04-18 NOTE — Progress Notes (Signed)
Quick Note:  Called and informed pt. ______ 

## 2012-04-18 NOTE — Progress Notes (Signed)
Faxed to PCP

## 2012-05-02 DIAGNOSIS — N390 Urinary tract infection, site not specified: Secondary | ICD-10-CM | POA: Diagnosis not present

## 2012-05-02 DIAGNOSIS — I4891 Unspecified atrial fibrillation: Secondary | ICD-10-CM | POA: Diagnosis not present

## 2012-05-05 ENCOUNTER — Ambulatory Visit: Payer: Medicare Other | Admitting: Gastroenterology

## 2012-05-11 ENCOUNTER — Ambulatory Visit (INDEPENDENT_AMBULATORY_CARE_PROVIDER_SITE_OTHER): Payer: Medicare Other | Admitting: Gastroenterology

## 2012-05-11 ENCOUNTER — Emergency Department (HOSPITAL_COMMUNITY): Payer: Medicare Other

## 2012-05-11 ENCOUNTER — Encounter (HOSPITAL_COMMUNITY): Payer: Self-pay | Admitting: *Deleted

## 2012-05-11 ENCOUNTER — Inpatient Hospital Stay (HOSPITAL_COMMUNITY)
Admission: EM | Admit: 2012-05-11 | Discharge: 2012-05-13 | DRG: 641 | Disposition: A | Discharge status: Home / Self Care | Payer: Medicare Other | Attending: Internal Medicine | Admitting: Internal Medicine

## 2012-05-11 ENCOUNTER — Encounter: Payer: Self-pay | Admitting: Gastroenterology

## 2012-05-11 VITALS — BP 70/50 | HR 60 | Temp 98.4°F | Ht 62.0 in | Wt 123.8 lb

## 2012-05-11 DIAGNOSIS — Z720 Tobacco use: Secondary | ICD-10-CM

## 2012-05-11 DIAGNOSIS — E1169 Type 2 diabetes mellitus with other specified complication: Secondary | ICD-10-CM | POA: Diagnosis not present

## 2012-05-11 DIAGNOSIS — F411 Generalized anxiety disorder: Secondary | ICD-10-CM | POA: Diagnosis present

## 2012-05-11 DIAGNOSIS — I050 Rheumatic mitral stenosis: Secondary | ICD-10-CM

## 2012-05-11 DIAGNOSIS — R9389 Abnormal findings on diagnostic imaging of other specified body structures: Secondary | ICD-10-CM

## 2012-05-11 DIAGNOSIS — I059 Rheumatic mitral valve disease, unspecified: Secondary | ICD-10-CM | POA: Diagnosis present

## 2012-05-11 DIAGNOSIS — K838 Other specified diseases of biliary tract: Secondary | ICD-10-CM

## 2012-05-11 DIAGNOSIS — K5732 Diverticulitis of large intestine without perforation or abscess without bleeding: Secondary | ICD-10-CM

## 2012-05-11 DIAGNOSIS — I509 Heart failure, unspecified: Secondary | ICD-10-CM

## 2012-05-11 DIAGNOSIS — E1129 Type 2 diabetes mellitus with other diabetic kidney complication: Secondary | ICD-10-CM | POA: Diagnosis present

## 2012-05-11 DIAGNOSIS — I129 Hypertensive chronic kidney disease with stage 1 through stage 4 chronic kidney disease, or unspecified chronic kidney disease: Secondary | ICD-10-CM | POA: Diagnosis present

## 2012-05-11 DIAGNOSIS — E119 Type 2 diabetes mellitus without complications: Secondary | ICD-10-CM | POA: Diagnosis not present

## 2012-05-11 DIAGNOSIS — R300 Dysuria: Secondary | ICD-10-CM

## 2012-05-11 DIAGNOSIS — D649 Anemia, unspecified: Secondary | ICD-10-CM | POA: Diagnosis not present

## 2012-05-11 DIAGNOSIS — K219 Gastro-esophageal reflux disease without esophagitis: Secondary | ICD-10-CM | POA: Diagnosis present

## 2012-05-11 DIAGNOSIS — K5792 Diverticulitis of intestine, part unspecified, without perforation or abscess without bleeding: Secondary | ICD-10-CM

## 2012-05-11 DIAGNOSIS — R634 Abnormal weight loss: Secondary | ICD-10-CM

## 2012-05-11 DIAGNOSIS — N390 Urinary tract infection, site not specified: Secondary | ICD-10-CM

## 2012-05-11 DIAGNOSIS — F17200 Nicotine dependence, unspecified, uncomplicated: Secondary | ICD-10-CM | POA: Diagnosis present

## 2012-05-11 DIAGNOSIS — J449 Chronic obstructive pulmonary disease, unspecified: Secondary | ICD-10-CM

## 2012-05-11 DIAGNOSIS — I4821 Permanent atrial fibrillation: Secondary | ICD-10-CM | POA: Diagnosis present

## 2012-05-11 DIAGNOSIS — I495 Sick sinus syndrome: Secondary | ICD-10-CM

## 2012-05-11 DIAGNOSIS — I959 Hypotension, unspecified: Secondary | ICD-10-CM | POA: Insufficient documentation

## 2012-05-11 DIAGNOSIS — I10 Essential (primary) hypertension: Secondary | ICD-10-CM

## 2012-05-11 DIAGNOSIS — E860 Dehydration: Principal | ICD-10-CM | POA: Diagnosis present

## 2012-05-11 DIAGNOSIS — I4891 Unspecified atrial fibrillation: Secondary | ICD-10-CM

## 2012-05-11 DIAGNOSIS — R197 Diarrhea, unspecified: Secondary | ICD-10-CM

## 2012-05-11 DIAGNOSIS — K439 Ventral hernia without obstruction or gangrene: Secondary | ICD-10-CM

## 2012-05-11 DIAGNOSIS — J4489 Other specified chronic obstructive pulmonary disease: Secondary | ICD-10-CM

## 2012-05-11 DIAGNOSIS — Z7901 Long term (current) use of anticoagulants: Secondary | ICD-10-CM

## 2012-05-11 DIAGNOSIS — N289 Disorder of kidney and ureter, unspecified: Secondary | ICD-10-CM | POA: Diagnosis present

## 2012-05-11 DIAGNOSIS — D539 Nutritional anemia, unspecified: Secondary | ICD-10-CM | POA: Diagnosis present

## 2012-05-11 DIAGNOSIS — R011 Cardiac murmur, unspecified: Secondary | ICD-10-CM

## 2012-05-11 DIAGNOSIS — F419 Anxiety disorder, unspecified: Secondary | ICD-10-CM

## 2012-05-11 DIAGNOSIS — E162 Hypoglycemia, unspecified: Secondary | ICD-10-CM | POA: Diagnosis present

## 2012-05-11 DIAGNOSIS — R935 Abnormal findings on diagnostic imaging of other abdominal regions, including retroperitoneum: Secondary | ICD-10-CM

## 2012-05-11 DIAGNOSIS — Z9089 Acquired absence of other organs: Secondary | ICD-10-CM

## 2012-05-11 DIAGNOSIS — Z950 Presence of cardiac pacemaker: Secondary | ICD-10-CM

## 2012-05-11 DIAGNOSIS — R0989 Other specified symptoms and signs involving the circulatory and respiratory systems: Secondary | ICD-10-CM | POA: Diagnosis present

## 2012-05-11 DIAGNOSIS — I2510 Atherosclerotic heart disease of native coronary artery without angina pectoris: Secondary | ICD-10-CM

## 2012-05-11 DIAGNOSIS — R109 Unspecified abdominal pain: Secondary | ICD-10-CM

## 2012-05-11 DIAGNOSIS — G4730 Sleep apnea, unspecified: Secondary | ICD-10-CM | POA: Diagnosis present

## 2012-05-11 DIAGNOSIS — N182 Chronic kidney disease, stage 2 (mild): Secondary | ICD-10-CM | POA: Diagnosis present

## 2012-05-11 HISTORY — DX: Endocarditis, valve unspecified: I38

## 2012-05-11 HISTORY — DX: Rheumatic heart disease, unspecified: I09.9

## 2012-05-11 HISTORY — DX: Primary central sleep apnea: G47.31

## 2012-05-11 HISTORY — DX: Urinary tract infection, site not specified: N39.0

## 2012-05-11 HISTORY — DX: Gastritis, unspecified, without bleeding: K29.70

## 2012-05-11 LAB — URINALYSIS, ROUTINE W REFLEX MICROSCOPIC
Nitrite: NEGATIVE
Specific Gravity, Urine: 1.025 (ref 1.005–1.030)
pH: 5.5 (ref 5.0–8.0)

## 2012-05-11 LAB — GLUCOSE, CAPILLARY: Glucose-Capillary: 65 mg/dL — ABNORMAL LOW (ref 70–99)

## 2012-05-11 LAB — CBC WITH DIFFERENTIAL/PLATELET
Basophils Absolute: 0.1 10*3/uL (ref 0.0–0.1)
Eosinophils Absolute: 0.3 10*3/uL (ref 0.0–0.7)
Eosinophils Relative: 3 % (ref 0–5)
Lymphocytes Relative: 25 % (ref 12–46)
MCH: 32.3 pg (ref 26.0–34.0)
MCV: 96.4 fL (ref 78.0–100.0)
Platelets: 193 10*3/uL (ref 150–400)
RDW: 13.3 % (ref 11.5–15.5)
WBC: 10.1 10*3/uL (ref 4.0–10.5)

## 2012-05-11 LAB — COMPREHENSIVE METABOLIC PANEL
ALT: 44 U/L — ABNORMAL HIGH (ref 0–35)
AST: 23 U/L (ref 0–37)
Calcium: 9.6 mg/dL (ref 8.4–10.5)
Sodium: 140 mEq/L (ref 135–145)
Total Protein: 7.5 g/dL (ref 6.0–8.3)

## 2012-05-11 LAB — URINE MICROSCOPIC-ADD ON

## 2012-05-11 LAB — TROPONIN I: Troponin I: <0.3 ng/mL (ref <0.30)

## 2012-05-11 MED ORDER — ENOXAPARIN SODIUM 30 MG/0.3ML ~~LOC~~ SOLN
30.0000 mg | SUBCUTANEOUS | Status: DC
  Administered 2012-05-11: 30 mg via SUBCUTANEOUS
  Filled 2012-05-11: qty 0.3

## 2012-05-11 MED ORDER — WARFARIN SODIUM 2 MG PO TABS
2.0000 mg | ORAL_TABLET | ORAL | Status: AC
  Administered 2012-05-11: 2 mg via ORAL
  Filled 2012-05-11: qty 1

## 2012-05-11 MED ORDER — ENOXAPARIN SODIUM 40 MG/0.4ML ~~LOC~~ SOLN: 40.0000 mg | SUBCUTANEOUS | Status: DC

## 2012-05-11 MED ORDER — FUROSEMIDE 40 MG PO TABS
40.0000 mg | ORAL_TABLET | Freq: Every day | ORAL | Status: DC
Start: 2012-05-12 — End: 2012-05-12

## 2012-05-11 MED ORDER — INFLUENZA VIRUS VACC SPLIT PF IM SUSP
0.5000 mL | INTRAMUSCULAR | Status: AC
  Administered 2012-05-12: 0.5 mL via INTRAMUSCULAR
  Filled 2012-05-11: qty 0.5

## 2012-05-11 MED ORDER — ACETAMINOPHEN 650 MG RE SUPP: 650.0000 mg | Freq: Four times a day (QID) | RECTAL | Status: DC | PRN

## 2012-05-11 MED ORDER — DEXTROSE 50 % IV SOLN
INTRAVENOUS | Status: AC
  Administered 2012-05-11: 12.5 g via INTRAVENOUS
  Filled 2012-05-11: qty 50

## 2012-05-11 MED ORDER — MONTELUKAST SODIUM 10 MG PO TABS
10.0000 mg | ORAL_TABLET | Freq: Every day | ORAL | Status: DC
  Administered 2012-05-11 – 2012-05-12 (×2): 10 mg via ORAL
  Filled 2012-05-11 (×2): qty 1

## 2012-05-11 MED ORDER — WARFARIN - PHARMACIST DOSING INPATIENT: Freq: Every day | Status: DC

## 2012-05-11 MED ORDER — DEXTROSE 50 % IV SOLN
12.5000 g | Freq: Once | INTRAVENOUS | Status: AC
  Administered 2012-05-11: 12.5 g via INTRAVENOUS

## 2012-05-11 MED ORDER — INSULIN ASPART 100 UNIT/ML ~~LOC~~ SOLN
0.0000 [IU] | Freq: Three times a day (TID) | SUBCUTANEOUS | Status: DC
  Administered 2012-05-12: 2 [IU] via SUBCUTANEOUS

## 2012-05-11 MED ORDER — DEXTROSE 5 % IV SOLN
1.0000 g | Freq: Once | INTRAVENOUS | Status: AC
  Administered 2012-05-11: 1 g via INTRAVENOUS
  Filled 2012-05-11: qty 10

## 2012-05-11 MED ORDER — SODIUM CHLORIDE 0.9 % IV SOLN
INTRAVENOUS | Status: DC
  Administered 2012-05-12: 07:00:00 via INTRAVENOUS
  Administered 2012-05-12: 1000 mL via INTRAVENOUS

## 2012-05-11 MED ORDER — SODIUM CHLORIDE 0.9 % IV SOLN: INTRAVENOUS | Status: DC

## 2012-05-11 MED ORDER — POLYETHYLENE GLYCOL 3350 17 G PO PACK: 17.0000 g | PACK | Freq: Every day | ORAL | Status: DC | PRN

## 2012-05-11 MED ORDER — SIMVASTATIN 10 MG PO TABS
10.0000 mg | ORAL_TABLET | Freq: Every day | ORAL | Status: DC
  Administered 2012-05-11 – 2012-05-12 (×2): 10 mg via ORAL
  Filled 2012-05-11 (×2): qty 1

## 2012-05-11 MED ORDER — CLONAZEPAM 0.5 MG PO TABS
0.5000 mg | ORAL_TABLET | Freq: Two times a day (BID) | ORAL | Status: DC | PRN
  Administered 2012-05-12: 0.5 mg via ORAL
  Filled 2012-05-11: qty 1

## 2012-05-11 MED ORDER — ACETAMINOPHEN 325 MG PO TABS: 650.0000 mg | ORAL_TABLET | Freq: Four times a day (QID) | ORAL | Status: DC | PRN

## 2012-05-11 MED ORDER — TRAZODONE HCL 50 MG PO TABS: 25.0000 mg | ORAL_TABLET | Freq: Every evening | ORAL | Status: DC | PRN

## 2012-05-11 MED ORDER — ALBUTEROL SULFATE HFA 108 (90 BASE) MCG/ACT IN AERS: 2.0000 | INHALATION_SPRAY | Freq: Four times a day (QID) | RESPIRATORY_TRACT | Status: DC | PRN

## 2012-05-11 MED ORDER — SODIUM CHLORIDE 0.9 % IV BOLUS (SEPSIS)
250.0000 mL | Freq: Once | INTRAVENOUS | Status: AC
  Administered 2012-05-11: 250 mL via INTRAVENOUS

## 2012-05-11 MED ORDER — ACETAMINOPHEN 325 MG PO TABS
650.0000 mg | ORAL_TABLET | Freq: Once | ORAL | Status: AC
  Administered 2012-05-11: 650 mg via ORAL
  Filled 2012-05-11: qty 1
  Filled 2012-05-11: qty 2

## 2012-05-11 MED ORDER — FLUTICASONE-SALMETEROL 250-50 MCG/DOSE IN AEPB
INHALATION_SPRAY | RESPIRATORY_TRACT | Status: AC
  Filled 2012-05-11: qty 14

## 2012-05-11 MED ORDER — ONDANSETRON HCL 4 MG/2ML IJ SOLN: 4.0000 mg | INTRAMUSCULAR | Status: DC | PRN

## 2012-05-11 MED ORDER — TIOTROPIUM BROMIDE MONOHYDRATE 18 MCG IN CAPS
18.0000 ug | ORAL_CAPSULE | Freq: Every day | RESPIRATORY_TRACT | Status: DC
  Administered 2012-05-13: 18 ug via RESPIRATORY_TRACT
  Filled 2012-05-11: qty 5

## 2012-05-11 MED ORDER — BISACODYL 10 MG RE SUPP: 10.0000 mg | Freq: Every day | RECTAL | Status: DC | PRN

## 2012-05-11 MED ORDER — DIGOXIN 125 MCG PO TABS: 125.0000 ug | ORAL_TABLET | Freq: Every day | ORAL | Status: DC

## 2012-05-11 MED ORDER — FLUTICASONE-SALMETEROL 250-50 MCG/DOSE IN AEPB
1.0000 | INHALATION_SPRAY | Freq: Two times a day (BID) | RESPIRATORY_TRACT | Status: DC
  Administered 2012-05-12 – 2012-05-13 (×3): 1 via RESPIRATORY_TRACT
  Filled 2012-05-11: qty 14

## 2012-05-11 MED ORDER — PANTOPRAZOLE SODIUM 40 MG PO TBEC
80.0000 mg | DELAYED_RELEASE_TABLET | Freq: Every day | ORAL | Status: DC
  Administered 2012-05-12: 80 mg via ORAL
  Filled 2012-05-11: qty 2

## 2012-05-11 NOTE — H&P (Addendum)
[facility] History and Physical  [name]  [id number]  DOB: 11-04-1934   DOA: 05/11/2012  PCP: [name], FNP  Cardiologist: [facility]  Pulmonolgy:  [name] Koleen Nimrod   Chief Complaint:  Weakness and lethargy for one and a half weeks  HPI: [name] is an 77 y.o. female.  Elderly Caucasian lady with a history of diabetes, hypertension, chronic atrial fibrillation due to childhood rheumatic heart disease, tachybradycardia syndrome for which she's had a pacemaker, and whose medications include  digoxin, Lasix,  Lopressor, and diltiazem. She has been treated 10 times for urinary tract infections in the past 6 months, mostly her with Cipro; she has lost 26 pounds in the past 7 months; she was recently treated at [name] emergency room with Cipro and Flagyl for acute diverticulitis. She went for followup of diverticulitis today to [name]. Barney [name], and complained of being lethargic and dizzy for the past 10 days, and during the evaluation her blood pressures was found to be low with systolic in the [id number]. She was sent to [name] emergency room for further evaluation.  In the emergency room patient was hypotensive on arrival but she responded to vigorous IV fluid hydration; later her blood sugar was found to be 52 and she reported feeling better after intravenous dextrose.  Her granddaughter lives with her reports that she's been lightheaded and dizzy and almost falling when she stands up in the morning, but has had no frank falls or head injury. Patient reports that she's been eating and drinking normally but continues to lose weight. She does not have a history of thyroid problems. Her fasting blood sugars tend to be around 110, and her two-hour postprandial tends to be around 150.   She has had no fever nor chills, no cough or colds or shortness of breath; she does have cramping lower abdominal heaviness which she associates with urinary tract  infections, and bilateral posterior flank pain; she does have an appointment to see a urologist.   She reports she uses nocturnal oxygen prescribed by her pulmonologist for neurogenic sleep apnea; She reports her left leg is always colder than her right. She sees a podiatrist, [name]. Irving [name], every 3 months    Rewiew of Systems:  Uses nocturnal oxygen, Sleep apnea;  All systems negative except as marked bold or noted in the HPI;  Constitutional: Negative for malaise, fever and chills. ;  Eyes: Negative for eye pain, redness and discharge. ;  ENMT: Negative for ear pain, hoarseness, nasal congestion, sinus pressure and sore throat. ;  Cardiovascular: Negative for chest pain, palpitations, diaphoresis, dyspnea and peripheral edema. ;  Respiratory: Negative for cough, hemoptysis, wheezing and stridor. ;  Gastrointestinal: Negative for nausea, vomiting, melena, blood in stool, hematemesis, jaundice and rectal bleeding.  diarrhea, alternating constipation, abdominal pain; unusual weight loss.26 lbs since February  Genitourinary: Negative for frequency, dysuria, incontinence,flank pain and hematuria;  Musculoskeletal: Negative for back pain and neck pain.  Negative for swelling and trauma.; Chronic right hip pain;  Skin: . Negative for pruritus, rash, abrasions, bruising and skin lesion.; ulcerations  Neuro: Negative for lightheadedness and neck stiffness. Negative for weakness, altered level of consciousness , altered mental status, extremity weakness, burning feet, involuntary movement, seizure and syncope.headache 2-3/week x 2-3 months  Psych: negative for , depression, insomnia, tearfulness, panic attacks, hallucinations, paranoia, suicidal or homicidal ideation; anxiety.   Past Medical History  Diagnosis Date  . Permanent atrial fibrillation     Tachybradycardia syndrome on Coumadin anticoagulation  .  CAD (coronary artery disease) 2011    Catheterization August 2011 mild nonobstructive  coronary artery disease complicated by large left rectus sheath hematoma status post evacuation Coumadin restarted without recurrence  . Diabetes mellitus   . Chronic airway obstruction, not elsewhere classified   . Type II or unspecified type diabetes mellitus without mention of complication, not stated as uncontrolled   . Anemia, unspecified   . Chronic kidney disease, stage II (mild)   . Unspecified hypertensive kidney disease with chronic kidney disease stage I through stage IV, or unspecified   . Tachycardia-bradycardia     s/p PPM by [name]  . GERD (gastroesophageal reflux disease)   . Abnormal CT of the chest     Mediastinal and hilar adenopathy followed by [name]. Koleen Nimrod in [place]  . Congestive heart failure, unspecified     EF now 60%, previous nonischemic cardiomyopathy  . Pacemaker     Single chamber [place]. Jude ACCENT [phone] SN 719 04/11/1959 10/31/2010  . Mitral stenosis     Felt to be moderate by catheterization, but TEE only show mild mitral stenosis. Also no significant mitral regurgitation.  . Hypercholesterolemia   . Diverticulitis Dec 2012  . Body mass index (BMI) of 20.0-20.9 in adult FEB 2013 147 LBS    Past Surgical History  Procedure Date  . Single-chamber pacemaker implantation 3/12    by [name]  . Evacuation of retroperitoneal and left rectus sheath   . Cholecystectomy     40+ years ago  . Hernia repair     X3  . Pilonidal cyst / sinus excision   . Esophagogastroduodenoscopy 10/2011    Fields-erosive gastritis, biopsy with no H. pylori, hiatal hernia, peptic duodenitis, no celiac disease, duodenal diverticulum, duodenal nodule  . Colonoscopy 10/2011    [name] sigmoid to descending diverticulosis, internal hemorrhoids  . Eus 11/16/11    [name]-13 MM CBD, normal pancreatic duct, otherwise normal EUS    Medications:  HOME MEDS: Prior to Admission medications   Medication Sig Start Date End Date Taking? Authorizing Provider  ciprofloxacin (CIPRO)  500 MG tablet Take 500 mg by mouth 2 (two) times daily.   Yes Historical Provider, MD  clonazePAM (KLONOPIN) 0.5 MG tablet Take 0.5 mg by mouth 3 (three) times daily as needed. 12/03/10 06/10/12 Yes Ezra Sites, MD  digoxin (LANOXIN) 0.125 MG tablet Take 1 tablet (125 mcg total) by mouth daily. 09/21/11  Yes Lelon Perla, MD  diltiazem (CARTIA XT) 240 MG 24 hr capsule Take 1 capsule (240 mg total) by mouth daily. 02/06/11 06/10/12 Yes Thompson Grayer, MD  Fluticasone-Salmeterol (ADVAIR DISKUS) 250-50 MCG/DOSE AEPB Inhale 1 puff into the lungs every 12 (twelve) hours.     Yes Historical Provider, MD  furosemide (LASIX) 40 MG tablet Take 40 mg by mouth daily.     Yes Historical Provider, MD  glyBURIDE (DIABETA) 5 MG tablet Take 5 mg by mouth daily with breakfast.    Yes Historical Provider, MD  HYDROcodone-acetaminophen (NORCO) 5-325 MG per tablet Take 1 tablet by mouth every 6 (six) hours as needed. For pain    Yes Historical Provider, MD  metoprolol tartrate (LOPRESSOR) 25 MG tablet Take 1 tablet (25 mg total) by mouth 2 (two) times daily. 11/05/11  Yes Ezra Sites, MD  omeprazole (PRILOSEC) 40 MG capsule Take 40 mg by mouth daily.     Yes Historical Provider, MD  PROAIR HFA 108 (90 BASE) MCG/ACT inhaler Take 2 puffs by mouth Every 6  hours as needed. For shortness of breath 09/07/11  Yes Historical Provider, MD  simvastatin (ZOCOR) 10 MG tablet Take 1 tablet (10 mg total) by mouth at bedtime. 10/30/11  Yes Wellington Hampshire, MD  tiotropium (SPIRIVA) 18 MCG inhalation capsule Place 18 mcg into inhaler and inhale daily.     Yes Historical Provider, MD  warfarin (COUMADIN) 2 MG tablet Take 1-2 mg by mouth daily. *Take one-half tablet (1mg ) every day in the evening, except take one tablet on Wednesdays only*   Yes Historical Provider, MD  zafirlukast (ACCOLATE) 20 MG tablet Take 20 mg by mouth 2 (two) times daily.     Yes Historical Provider, MD     Allergies:  Allergies  Allergen Reactions  . Tramadol  Nausea Only    Social History:   reports that she quit smoking about 5 years ago. Her smoking use included Cigarettes. She has a 90 pack-year smoking history. She has never used smokeless tobacco. She reports that she does not drink alcohol or use illicit drugs.  Family History: Family History  Problem Relation Age of Onset  . Cancer Mother     Uterus  . Cancer Daughter     kidney cancer, in remission  . Heart disease Father   . Colon cancer Neg Hx      Physical Exam: Filed Vitals:   05/11/12 1750 05/11/12 1800 05/11/12 1904 05/11/12 2008  BP: 99/55 139/50 99/52 106/55  Pulse: 60 59 60 60  Temp:      TempSrc:      Resp: 16  15 18   Height:      Weight:      SpO2: 100% 99% 100% 100%   Blood pressure 106/55, pulse 60, temperature 98.2 F (36.8 C), temperature source Oral, resp. rate 18, height 5\' 3"  (1.6 m), weight 55.792 kg (123 lb), SpO2 100.00%.  GEN:  Pleasant thin elderly Caucasian lady sitting up  in the stretcher in no acute distress; cooperative with exam PSYCH:  alert and oriented x4; does not appear anxious or depressed; affect is appropriate. HEENT: Mucous membranes pink, dry,  and anicteric; PERRLA; EOM intact; no cervical lymphadenopathy  Firm, prominent, thyromegaly;  No carotid bruit; no JVD; Breasts:: Not examined CHEST WALL: No tenderness; pacemaker in left anterior chest  CHEST: Normal respiration, clear to auscultation bilaterally HEART: Regular rate and rhythm, despite history of A. fib; 3/6 systolic murmur radiating to the carotids; no diastolic murmur; no rubs or gallops BACK:  marked  Kyphosis;  No  scoliosis;  bilateral CVA tenderness ABDOMEN: , soft non-tender; no masses, no organomegaly, normal abdominal bowel sounds;  no intertriginous candida. Rectal Exam: Not done EXTREMITIES: age-appropriate  osteoarthritic arthropathy of the hands and knees;Hammer toes no edema; no ulcerations; hyperpigmented skin changes both shins; left foot cold compared to  right; capillary refill adequate Genitalia: not examined PULSES: 2+  posterior tibials bilaterally; dorsalis pedis pulses not felt  SKIN: Normal hydration no rash o other than noted on shins  CNS: Cranial nerves 2-12 grossly intact no focal lateralizing neurologic deficit   Labs on Admission:  Basic Metabolic Panel:  Lab Q000111Q 1552  NA 140  K 3.7  CL 102  CO2 26  GLUCOSE 83  BUN 33*  CREATININE 1.70*  CALCIUM 9.6  MG --  PHOS --   Liver Function Tests:  Lab 05/11/12 1552  AST 23  ALT 44*  ALKPHOS 125*  BILITOT 0.4  PROT 7.5  ALBUMIN 4.0   No results found for  this basename: LIPASE:5,AMYLASE:5 in the last 168 hours No results found for this basename: AMMONIA:5 in the last 168 hours CBC:  Lab 05/11/12 1552  WBC 10.1  NEUTROABS 6.3  HGB 12.5  HCT 37.3  MCV 96.4  PLT 193   Cardiac Enzymes:  Lab 05/11/12 1552  CKTOTAL --  CKMB --  CKMBINDEX --  TROPONINI <0.30   BNP: No components found with this basename: POCBNP:5 D-dimer: No components found with this basename: D-DIMER:5 CBG:  Lab 05/11/12 1923 05/11/12 1838 05/11/12 1752  GLUCAP 152* 65* 52*    Radiological Exams on Admission: Dg Chest Port 1 View  05/11/2012  *RADIOLOGY REPORT*  Clinical Data: Hypotension.  PORTABLE CHEST - 1 VIEW  Comparison: 02/02/2012  Findings: Single view of the chest demonstrates a left single lead cardiac pacemaker.  Heart size is within normal limits.  Lungs are clear without airspace disease or edema.  The trachea is midline. Stable focus of sclerosis in the proximal right humerus could represent an enchondroma or bone infarct.  IMPRESSION: No acute cardiopulmonary disease.   Original Report Authenticated By: Markus Daft, M.D.     EKG: Independently reviewed.  ventricular paced rhythm    Assessment/Plan Present on Admission:  .Hypotension  Probably secondary to dehydration, given her BUN and creatinine mildly elevated above baseline, and her response to IV fluids.  Probably aggravated and possibly initiated by medications, including Lasix and rate controlling medications. Her heart rate is now so low that her pacemaker is kicked in and she is being ventricularly paced.  Chronic kidney disease stage II  .DM with .Hypoglycemia  Likely secondary to effect of her sulfonylurea and the presence of worsening renal failure and dehydration   .HYPERTENSION  Now hypotensive  .Atrial fibrillation -stable on therapeutic warfarin  .Tachycardia-bradycardia syndrome- stable  .COPD -stable  .CAD (coronary artery disease)- strip nonobstructive coronary disease  .Weight loss - unclear etiology    PLAN:  we'll admit this lady to step down unit for continued hydration and observation of her vital signs; will withhold all antihypertensive; and consider a cardiology consult in the morning for [place] went off her medications.   Will check digoxin level and depending on the result we'll consider reducing the dose of her digoxin. We note this lady has an ejection fraction of 65%, and no history of diastolic dysfunction.  We'll check her thyroid function, in view of thyromegaly and progressive weight loss; She has had EGD and colonoscopy just 6 months ago, it's unlikely that he has a malignancy which is in reach of the scope. Consideration may be given to a dedicated CT scan of the chest with contrast when her renal function improves a little, considering she has a 90-year tobacco history    will discontinue her sulfonylurea, and consider transitioning her to low dose glipizide, because of her history of renal problems  She will need to followup with urology.  Her urinalysis done in the emergency room was contaminated and repeat has been ordered; we will not give any further antibiotics until we have drawn a good sample; she has had multiple courses of antibiotics over the past months, without any clear sensitivities documented  Other plans as per orders.  Code Status: FULL  CODE  Family Communication: Johnell Comings, Daughter, [phone]) [phone]); G-daughter Louie Boston [phone]) [phone])  granddaughter lives with her and was present for the interview and examination. Disposition Plan: Probably home in a day or 2 for continued O/p work up   [place] [facility]  Pager [phone]   05/11/2012, 9:12 PM

## 2012-05-11 NOTE — ED Notes (Signed)
Pt given OJ with sugar and crackers for CBG.

## 2012-05-11 NOTE — Assessment & Plan Note (Signed)
PT PRESENTED WITH SBP 78. BP USUALLY 90-120. ON BP MEDS & COUMADIN. RECENT rX FOR UTI.  SEND TO ED FOR EVALUATION. MAY NEED TO ADJUST BP MEDS IF NO OTHER ETIOLOGY FOR LOW BP IDENTIFIED. ED(JUDY) NOTIFIED.  OPV IN 2 MOS.

## 2012-05-11 NOTE — Patient Instructions (Signed)
GLUCERNA THREE TIMES A DAY.  GO TO THE ED TO EVALUATE YOUR LOW BLOOD PRESSURE. YOU MAY NEED TO REDUCE YOUR BP MEDS.  FOLLOW UP IN 2 MOS.

## 2012-05-11 NOTE — ED Notes (Signed)
Dr Megan Salon in room at this time.

## 2012-05-11 NOTE — ED Notes (Signed)
Pt was at Dr. Nona Dell office for routine visit. States she has been feeling well. Recently completed Cipro for UTI. Sent by Dr. Oneida Alar for BP of 78/50

## 2012-05-11 NOTE — Assessment & Plan Note (Addendum)
ETIOLOGY UNCLEAR-?INFECTION/CHRONIC ILLNESS. NO GI CAUSE IDENTIFIED. PT HAS HAD FREQUENT UTIs & DIVERTICULITIS IN THE PAST 10 MOS & MULTIPLE CO-MORBIDITIES. PT HAS LOST 24 LBS SINCE FEB 2013.   GLUCERNA TID AGREE WITH UROLOGY APPT. OPV IN 2 MOS

## 2012-05-11 NOTE — Assessment & Plan Note (Signed)
RESOLVED.  MONITOR FOR FLARES.

## 2012-05-11 NOTE — ED Notes (Signed)
Pt states she came in today after her doctor told her that her blood pressure was too low. She also has been complaining of generalized weakness over the last week. Pt was hypoglycemic, previous nurse just gave 12.5 of d50. Will re check CBG. Pt AAx4, states she feels better after getting the d50 and is no longer feeling weak.

## 2012-05-11 NOTE — Progress Notes (Signed)
ANTICOAGULATION CONSULT NOTE - Initial Consult  Pharmacy Consult for Coumadin Indication: atrial fibrillation  Allergies  Allergen Reactions  . Tramadol Nausea Only    Patient Measurements: Height: 5\' 3"  (160 cm) Weight: 123 lb (55.792 kg) IBW/kg (Calculated) : 52.4   Vital Signs: Temp: 98.2 F (36.8 C) (10/02 1508) Temp src: Oral (10/02 1508) BP: 106/55 mmHg (10/02 2008) Pulse Rate: 60  (10/02 2008)  Labs:  [facility] 05/11/12 1552  HGB 12.5  HCT 37.3  PLT 193  APTT --  LABPROT 23.4*  INR 2.19*  HEPARINUNFRC --  CREATININE 1.70*  CKTOTAL --  CKMB --  TROPONINI <0.30    Estimated Creatinine Clearance: 23.7 ml/min (by C-G formula based on Cr of 1.7).   Medical History: Past Medical History  Diagnosis Date  . Permanent atrial fibrillation     Tachybradycardia syndrome on Coumadin anticoagulation  . CAD (coronary artery disease) 2011    Catheterization August 2011 mild nonobstructive coronary artery disease complicated by large left rectus sheath hematoma status post evacuation Coumadin restarted without recurrence  . Diabetes mellitus   . Chronic airway obstruction, not elsewhere classified   . Type II or unspecified type diabetes mellitus without mention of complication, not stated as uncontrolled   . Anemia, unspecified   . Chronic kidney disease, stage II (mild)   . Unspecified hypertensive kidney disease with chronic kidney disease stage I through stage IV, or unspecified   . Tachycardia-bradycardia     s/p PPM by Dr Caryl Comes  . GERD (gastroesophageal reflux disease)   . Abnormal CT of the chest     Mediastinal and hilar adenopathy followed by Dr. Koleen Nimrod in [place]  . Congestive heart failure, unspecified     EF now 60%, previous nonischemic cardiomyopathy  . Pacemaker     Single chamber [place]. Jude ACCENT [phone] SN 719 04/11/1959 10/31/2010  . Mitral stenosis     Felt to be moderate by catheterization, but TEE only show mild mitral stenosis. Also no  significant mitral regurgitation.  . Hypercholesterolemia   . Diverticulitis Dec 2012  . Body mass index (BMI) of 20.0-20.9 in adult FEB 2013 147 LBS    Medications:  Scheduled:    . acetaminophen  650 mg Oral Once  . cefTRIAXone (ROCEPHIN) IVPB 1 gram/50 mL D5W  1 g Intravenous Once  . dextrose  12.5 g Intravenous Once  . digoxin  125 mcg Oral Daily  . enoxaparin (LOVENOX) injection  40 mg Subcutaneous Q24H  . Fluticasone-Salmeterol  1 puff Inhalation Q12H  . furosemide  40 mg Oral Daily  . insulin aspart  0-9 Units Subcutaneous TID WC  . montelukast  10 mg Oral QHS  . pantoprazole  80 mg Oral Q1200  . simvastatin  10 mg Oral QHS  . sodium chloride  250 mL Intravenous Once  . tiotropium  18 mcg Inhalation Daily  . warfarin  2 mg Oral NOW  . Warfarin - Pharmacist Dosing Inpatient   Does not apply q1800  . DISCONTD: sodium chloride   Intravenous STAT    Assessment: 77 yo F on chronic warfarin 1mg  daily except 2mg  on Wed for Afib. INR therapeutic on admission. No bleeding noted.   Goal of Therapy:  INR 2-3   Plan:  Coumadin 2mg  po x1 dose tonight Daily INR  Shalini Mair, Lavonia Drafts 05/11/2012,9:55 PM

## 2012-05-11 NOTE — ED Notes (Signed)
edp notified of repeat CBG.  Orders received.

## 2012-05-11 NOTE — ED Provider Notes (Addendum)
History  This chart was scribed for Mervin Kung, MD by Jenne Campus. This patient was seen in room APA04/APA04 and the patient's care was started at 3:35PM.  CSN: [id number]  Arrival date & time 05/11/12  9   First MD Initiated Contact with Patient 05/11/12 1535      Chief Complaint  Patient presents with  . Hypotension     The history is provided by the patient. No language interpreter was used.    [name] is a 77 y.o. female who presents to the Emergency Department complaining of hypotension that was noticed at her GI appointment about 30 minutes PTA.[name] Pt states that she was at Dr. Nona Dell office for a routine visit for her h/o GERD, diverticulitis and anemia when she reported that she has felt fatigued and generalized weakness for the past 7 days. Her BP was measured 78/50 at the time and she was advised to come to the ED for further evaluation. Her BP was measured 98/50 in the ED. She denies being on BP medications currently or having a h/o of hypertension or hypotension. She reports that she recently finished Cipro 2 days ago for a UTI. She denies dysuria and frequency but states that she is still having a hard time starting a urine stream. She also reports one episode of non-bloody diarrhea today, chronic back pain that radiates down her right hip and chronic abdominal swelling that is currently being explored by Dr. Oneida Alar. She denies fever, neck pain, sore throat, visual disturbance, CP, cough, SOB, abdominal pain, nausea, emesis, dysuria, HA, and rash as associated symptoms. She also has a h/o CAD, DM, CHF and states that she currently takes coumadin for A. Fib. She is a former smoker but denies alcohol use.   PCP is Dr. Hassell Done with [facility]. Dr. Dannielle Burn was Cardiologist but he moved to [facility]. No current Cardiologist.  Past Medical History  Diagnosis Date  . Permanent atrial fibrillation     Tachybradycardia syndrome on Coumadin  anticoagulation  . CAD (coronary artery disease) 2011    Catheterization August 2011 mild nonobstructive coronary artery disease complicated by large left rectus sheath hematoma status post evacuation Coumadin restarted without recurrence  . Diabetes mellitus   . Chronic airway obstruction, not elsewhere classified   . Type II or unspecified type diabetes mellitus without mention of complication, not stated as uncontrolled   . Anemia, unspecified   . Chronic kidney disease, stage II (mild)   . Unspecified hypertensive kidney disease with chronic kidney disease stage I through stage IV, or unspecified   . Tachycardia-bradycardia     s/p PPM by Dr Caryl Comes  . GERD (gastroesophageal reflux disease)   . Abnormal CT of the chest     Mediastinal and hilar adenopathy followed by Dr. Koleen Nimrod in [place]  . Congestive heart failure, unspecified     EF now 60%, previous nonischemic cardiomyopathy  . Pacemaker     Single chamber [place]. Jude ACCENT [phone] SN 719 04/11/1957 10/31/2010  . Mitral stenosis     Felt to be moderate by catheterization, but TEE only show mild mitral stenosis. Also no significant mitral regurgitation.  . Hypercholesterolemia   . Diverticulitis Dec 2012  . Body mass index (BMI) of 20.0-20.9 in adult FEB 2013 147 LBS    Past Surgical History  Procedure Date  . Single-chamber pacemaker implantation 3/12    by Dr Caryl Comes  . Evacuation of retroperitoneal and left rectus sheath   . Cholecystectomy  40+ years ago  . Hernia repair     X3  . Pilonidal cyst / sinus excision   . Esophagogastroduodenoscopy 10/2011    Fields-erosive gastritis, biopsy with no H. pylori, hiatal hernia, peptic duodenitis, no celiac disease, duodenal diverticulum, duodenal nodule  . Colonoscopy 10/2011    Dyann Ruddle sigmoid to descending diverticulosis, internal hemorrhoids  . Eus 11/16/11    Mishra-13 MM CBD, normal pancreatic duct, otherwise normal EUS    Family History  Problem Relation Age of  Onset  . Cancer Mother     Uterus  . Cancer Daughter     kidney cancer, in remission  . Heart disease Father   . Colon cancer Neg Hx     History  Substance Use Topics  . Smoking status: Former Smoker -- 2.0 packs/day for 45 years    Types: Cigarettes    Quit date: 08/10/2006  . Smokeless tobacco: Never Used  . Alcohol Use: No    No OB history provided.  Review of Systems  Constitutional: Positive for fatigue. Negative for fever.  HENT: Negative for congestion, sinus pressure and ear discharge.   Eyes: Negative for discharge.  Respiratory: Negative for cough and shortness of breath.   Cardiovascular: Negative for chest pain.  Gastrointestinal: Positive for diarrhea. Negative for nausea, vomiting and abdominal pain.  Genitourinary: Negative for dysuria, frequency and hematuria.  Musculoskeletal: Positive for back pain (radiates down right hip).  Skin: Negative for rash.  Neurological: Positive for weakness. Negative for seizures and headaches.  Hematological: Bruises/bleeds easily.    Allergies  Tramadol  Home Medications   Current Outpatient Rx  Name Route Sig Dispense Refill  . CLONAZEPAM 0.5 MG PO TABS Oral Take 0.5 mg by mouth 3 (three) times daily as needed.    [name] DIGOXIN 0.125 MG PO TABS Oral Take 1 tablet (125 mcg total) by mouth daily. 30 tablet 9  . DILTIAZEM HCL ER COATED BEADS 240 MG PO CP24 Oral Take 1 capsule (240 mg total) by mouth daily. 30 capsule 6  . FLUTICASONE-SALMETEROL 250-50 MCG/DOSE IN AEPB Inhalation Inhale 1 puff into the lungs every 12 (twelve) hours.      . FUROSEMIDE 40 MG PO TABS Oral Take 40 mg by mouth daily.      . GLYBURIDE 5 MG PO TABS Oral Take 5 mg by mouth daily with breakfast.     . HYDROCODONE-ACETAMINOPHEN 5-325 MG PO TABS Oral Take 1 tablet by mouth every 6 (six) hours as needed. For pain     . METOPROLOL TARTRATE 25 MG PO TABS Oral Take 1 tablet (25 mg total) by mouth 2 (two) times daily. 60 tablet 6  . OMEPRAZOLE 40 MG PO CPDR  Oral Take 40 mg by mouth daily.      [name] PROAIR HFA 108 (90 BASE) MCG/ACT IN AERS Oral Take 2 puffs by mouth Every 6 hours as needed. For shortness of breath    . SIMVASTATIN 10 MG PO TABS Oral Take 1 tablet (10 mg total) by mouth at bedtime. 30 tablet 6  . TIOTROPIUM BROMIDE MONOHYDRATE 18 MCG IN CAPS Inhalation Place 18 mcg into inhaler and inhale daily.      . WARFARIN SODIUM 2 MG PO TABS Oral Take 2 mg by mouth daily.    [name] ZAFIRLUKAST 20 MG PO TABS Oral Take 20 mg by mouth 2 (two) times daily.      [name] CIPROFLOXACIN HCL 500 MG PO TABS Oral Take 500 mg by mouth 2 (two) times daily.  Triage Vitals: BP 98/50  Pulse 61  Temp 98.2 F (36.8 C) (Oral)  Resp 20  Ht 5\' 3"  (1.6 m)  Wt 123 lb (55.792 kg)  BMI 21.79 kg/m2  SpO2 100%  Physical Exam  Nursing note and vitals reviewed. Constitutional: She is oriented to person, place, and time. She appears well-developed and well-nourished. No distress.  HENT:  Head: Normocephalic and atraumatic.  Mouth/Throat: Oropharynx is clear and moist.  Eyes: Conjunctivae normal and EOM are normal. Pupils are equal, round, and reactive to light.  Neck: Neck supple. No tracheal deviation present.  Cardiovascular: Normal rate and regular rhythm.   No murmur heard. Pulmonary/Chest: Effort normal and breath sounds normal. No respiratory distress.  Abdominal: Soft. Bowel sounds are normal. There is no tenderness.  Musculoskeletal: Normal range of motion. She exhibits no edema (no edema in the lower legs).  Lymphadenopathy:    She has no cervical adenopathy.  Neurological: She is alert and oriented to person, place, and time. No cranial nerve deficit.  Skin: Skin is warm and dry.  Psychiatric: She has a normal mood and affect. Her behavior is normal.    ED Course  Procedures (including critical care time)  DIAGNOSTIC STUDIES: Oxygen Saturation is 100% on room air, normal by my interpretation.    COORDINATION OF CARE: 3:48PM-Discussed treatment plan  which includes IV fluids, UA, CXR and blood work with pt at bedside and pt agreed to plan.  3:52PM-Ordered CXR, CBC, metabolic panel and UA.  [id number] 250 mL of Bolus.  8:02PM-Consult complete with Dr. Megan Salon. Patient case explained and discussed. Dr. Megan Salon agrees to admit patient for further evaluation and treatment to AP step down. Call ended 8:06PM.  Labs Reviewed  COMPREHENSIVE METABOLIC PANEL - Abnormal; Notable for the following:    BUN 33 (*)     Creatinine, Ser 1.70 (*)     ALT 44 (*)     Alkaline Phosphatase 125 (*)     GFR calc non Af Amer 28 (*)     GFR calc Af Amer 33 (*)     All other components within normal limits  PROTIME-INR - Abnormal; Notable for the following:    Prothrombin Time 23.4 (*)     INR 2.19 (*)     All other components within normal limits  URINALYSIS, ROUTINE W REFLEX MICROSCOPIC - Abnormal; Notable for the following:    Leukocytes, UA MODERATE (*)     All other components within normal limits  GLUCOSE, CAPILLARY - Abnormal; Notable for the following:    Glucose-Capillary 52 (*)     All other components within normal limits  GLUCOSE, CAPILLARY - Abnormal; Notable for the following:    Glucose-Capillary 65 (*)     All other components within normal limits  GLUCOSE, CAPILLARY - Abnormal; Notable for the following:    Glucose-Capillary 152 (*)     All other components within normal limits  URINE MICROSCOPIC-ADD ON - Abnormal; Notable for the following:    Squamous Epithelial / LPF MANY (*)     Bacteria, UA MANY (*)     All other components within normal limits  CBC WITH DIFFERENTIAL  TROPONIN I  URINE CULTURE   Dg Chest Port 1 View  05/11/2012  *RADIOLOGY REPORT*  Clinical Data: Hypotension.  PORTABLE CHEST - 1 VIEW  Comparison: 02/02/2012  Findings: Single view of the chest demonstrates a left single lead cardiac pacemaker.  Heart size is within normal limits.  Lungs are clear without airspace disease or  edema.  The trachea is  midline. Stable focus of sclerosis in the proximal right humerus could represent an enchondroma or bone infarct.  IMPRESSION: No acute cardiopulmonary disease.   Original Report Authenticated By: Markus Daft, M.D.      Date: 05/11/2012  Rate: 60  Rhythm: Paced ventricular  QRS Axis: normal  Intervals: normal  ST/T Wave abnormalities: nonspecific ST/T changes  Conduction Disutrbances:Paced  Narrative Interpretation:   Old EKG Reviewed: pacemaker is new compared to EKG from 11/01/2010.  Results for orders placed during the hospital encounter of 05/11/12  CBC WITH DIFFERENTIAL      Component Value Range   WBC 10.1  4.0 - 10.5 K/uL   RBC 3.87  3.87 - 5.11 MIL/uL   Hemoglobin 12.5  12.0 - 15.0 g/dL   HCT 37.3  36.0 - 46.0 %   MCV 96.4  78.0 - 100.0 fL   MCH 32.3  26.0 - 34.0 pg   MCHC 33.5  30.0 - 36.0 g/dL   RDW 13.3  11.5 - 15.5 %   Platelets 193  150 - 400 K/uL   Neutrophils Relative 63  43 - 77 %   Neutro Abs 6.3  1.7 - 7.7 K/uL   Lymphocytes Relative 25  12 - 46 %   Lymphs Abs 2.5  0.7 - 4.0 K/uL   Monocytes Relative 9  3 - 12 %   Monocytes Absolute 0.9  0.1 - 1.0 K/uL   Eosinophils Relative 3  0 - 5 %   Eosinophils Absolute 0.3  0.0 - 0.7 K/uL   Basophils Relative 1  0 - 1 %   Basophils Absolute 0.1  0.0 - 0.1 K/uL  COMPREHENSIVE METABOLIC PANEL      Component Value Range   Sodium 140  135 - 145 mEq/L   Potassium 3.7  3.5 - 5.1 mEq/L   Chloride 102  96 - 112 mEq/L   CO2 26  19 - 32 mEq/L   Glucose, Bld 83  70 - 99 mg/dL   BUN 33 (*) 6 - 23 mg/dL   Creatinine, Ser 1.70 (*) 0.50 - 1.10 mg/dL   Calcium 9.6  8.4 - 10.5 mg/dL   Total Protein 7.5  6.0 - 8.3 g/dL   Albumin 4.0  3.5 - 5.2 g/dL   AST 23  0 - 37 U/L   ALT 44 (*) 0 - 35 U/L   Alkaline Phosphatase 125 (*) 39 - 117 U/L   Total Bilirubin 0.4  0.3 - 1.2 mg/dL   GFR calc non Af Amer 28 (*) >90 mL/min   GFR calc Af Amer 33 (*) >90 mL/min  PROTIME-INR      Component Value Range   Prothrombin Time 23.4 (*) 11.6 -  15.2 seconds   INR 2.19 (*) 0.00 - 1.49  TROPONIN I      Component Value Range   Troponin I <0.30  <0.30 ng/mL  URINALYSIS, ROUTINE W REFLEX MICROSCOPIC      Component Value Range   Color, Urine YELLOW  YELLOW   APPearance CLEAR  CLEAR   Specific Gravity, Urine 1.025  1.005 - 1.030   pH 5.5  5.0 - 8.0   Glucose, UA NEGATIVE  NEGATIVE mg/dL   Hgb urine dipstick NEGATIVE  NEGATIVE   Bilirubin Urine NEGATIVE  NEGATIVE   Ketones, ur NEGATIVE  NEGATIVE mg/dL   Protein, ur NEGATIVE  NEGATIVE mg/dL   Urobilinogen, UA 0.2  0.0 - 1.0 mg/dL   Nitrite NEGATIVE  NEGATIVE   Leukocytes, UA MODERATE (*) NEGATIVE  GLUCOSE, CAPILLARY      Component Value Range   Glucose-Capillary 52 (*) 70 - 99 mg/dL   Comment 1 Notify RN    GLUCOSE, CAPILLARY      Component Value Range   Glucose-Capillary 65 (*) 70 - 99 mg/dL   Comment 1 Documented in Chart    GLUCOSE, CAPILLARY      Component Value Range   Glucose-Capillary 152 (*) 70 - 99 mg/dL  URINE MICROSCOPIC-ADD ON      Component Value Range   Squamous Epithelial / LPF MANY (*) RARE   WBC, UA 21-50  <3 WBC/hpf   Bacteria, UA MANY (*) RARE     1. Hypotension   2. Hypoglycemia   3. Urinary tract infection     CRITICAL CARE Performed by: Mervin Kung.   Total critical care time: 30  Critical care time was exclusive of separately billable procedures and treating other patients.  Critical care was necessary to treat or prevent imminent or life-threatening deterioration.  Critical care was time spent personally by me on the following activities: development of treatment plan with patient and/or surrogate as well as nursing, discussions with consultants, evaluation of patient's response to treatment, examination of patient, obtaining history from patient or surrogate, ordering and performing treatments and interventions, ordering and review of laboratory studies, ordering and review of radiographic studies, pulse oximetry and re-evaluation  of patient's condition.   MDM  Patient sent from Dr. Oneida Alar office for low blood pressure did arrive here with blood pressure systolic [id number] range. Patient given IV fluids with correction of the blood pressure currently systolic is [id number]. Heart rate was always in the 60s. Also patient's blood sugar dropped here she is a diabetic was given a snack blood sugar didn't rise that much so given half an amp of D50 blood sugar now around 150. Urinalysis with questionable contamination versus urinary tract infection since rest of workup is negative we'll treat his possible urinary tract infection culture sent. Patient treated with Rocephin 1 g IV piggyback.  Patient discussed with the hospitalist will be admitted to step down unit and observation status, temporary admit orders completed.     I personally performed the services described in this documentation, which was scribed in my presence. The recorded information has been reviewed and considered.     Mervin Kung, MD 05/11/12 2012  Mervin Kung, MD 05/11/12 2013

## 2012-05-11 NOTE — ED Notes (Signed)
Pt complaining of mild headache, dr Rogene Houston notified and advised to give 650mg  tylenol

## 2012-05-11 NOTE — Progress Notes (Signed)
Faxed to PCP

## 2012-05-11 NOTE — Progress Notes (Signed)
Subjective:    Patient ID: [name], female    DOB: [birth year], 77 y.o.   MRN: [id number]  PCP: MARTIN  HPI PT HAS HAD 8-10 UTIs IN THE PAST 6 MOS. HAD AN EPISODE OF DIVERTICULITIS. NOT TAKING GLUCERNA. NO PAIN. FEELS A LITTLE BLADDER PAIN. NO FEVER OR CHILLS, MELENA, BRBPR, NAUSEA, OR VOMITING. JUST FEELS WEAK. LAST CIPRO DOSE WAS MON. MAY NOT HAVE A BM EVERY DAY. HAD DIARRHEA X1 TODAY. OCCASIONAL HEART PALPITATIONS. NO SOB OR LOW BLOOD SUGAR.  Past Medical History  Diagnosis Date  . Permanent atrial fibrillation     Tachybradycardia syndrome on Coumadin anticoagulation  . CAD (coronary artery disease) 2011    Catheterization August 2011 mild nonobstructive coronary artery disease complicated by large left rectus sheath hematoma status post evacuation Coumadin restarted without recurrence  . Diabetes mellitus   . Chronic airway obstruction, not elsewhere classified   . Type II or unspecified type diabetes mellitus without mention of complication, not stated as uncontrolled   . Anemia, unspecified   . Chronic kidney disease, stage II (mild)   . Unspecified hypertensive kidney disease with chronic kidney disease stage I through stage IV, or unspecified   . Tachycardia-bradycardia     s/p PPM by Dr Caryl Comes  . GERD (gastroesophageal reflux disease)   . Abnormal CT of the chest     Mediastinal and hilar adenopathy followed by Dr. Koleen Nimrod in [place]  . Congestive heart failure, unspecified     EF now 60%, previous nonischemic cardiomyopathy  . Pacemaker     Single chamber [place]. Jude ACCENT [phone] SN 719 04/11/1959 10/31/2010  . Mitral stenosis     Felt to be moderate by catheterization, but TEE only show mild mitral stenosis. Also no significant mitral regurgitation.  . Hypercholesterolemia   . Diverticulitis Dec 2012    Past Surgical History  Procedure Date  . Single-chamber pacemaker implantation 3/12    by Dr Caryl Comes  . Evacuation of retroperitoneal and left rectus sheath   .  Cholecystectomy     40+ years ago  . Hernia repair     X3  . Pilonidal cyst / sinus excision   . Esophagogastroduodenoscopy 10/2011    Shelvy Heckert-erosive gastritis, biopsy with no H. pylori, hiatal hernia, peptic duodenitis, no celiac disease, duodenal diverticulum, duodenal nodule  . Colonoscopy 10/2011    Dyann Ruddle sigmoid to descending diverticulosis, internal hemorrhoids  . Eus 11/16/11    Mishra-13 MM CBD, normal pancreatic duct, otherwise normal EUS    Allergies  Allergen Reactions  . Tramadol Nausea Only    Current Outpatient Prescriptions  Medication Sig Dispense Refill  . digoxin (LANOXIN) 0.125 MG tablet Take 1 tablet (125 mcg total) by mouth daily.    . Fluticasone-Salmeterol (ADVAIR DISKUS) 250-50 MCG/DOSE AEPB Inhale 1 puff into the lungs every 12 (twelve) hours.      . furosemide (LASIX) 40 MG tablet Take 40 mg by mouth daily.      [name] glyBURIDE (DIABETA) 5 MG tablet Take 2.5 mg by mouth daily with breakfast.     . HYDROcodone-acetaminophen (NORCO) 5-325 MG per tablet Take 1 tablet by mouth every 6 (six) hours as needed. For pain     . metoprolol tartrate (LOPRESSOR) 25 MG tablet Take 1 tablet (25 mg total) by mouth 2 (two) times daily.    [name] omeprazole (PRILOSEC) 40 MG capsule Take 40 mg by mouth daily.      [name] OVER THE COUNTER MEDICATION Place 1 patch onto the skin as  needed. "Pain patch from [employer]" only uses when needed for hip pain     . PROAIR HFA 108 (90 BASE) MCG/ACT inhaler Take 2 puffs by mouth Every 6 hours as needed. For shortness of breath    . simvastatin (ZOCOR) 10 MG tablet Take 1 tablet (10 mg total) by mouth at bedtime.    [name] tiotropium (SPIRIVA) 18 MCG inhalation capsule Place 18 mcg into inhaler and inhale daily.      [name] warfarin (COUMADIN) 1 MG tablet Take 1 mg by mouth daily. Pt taking 1 mg daily now    . zafirlukast (ACCOLATE) 20 MG tablet Take 20 mg by mouth 2 (two) times daily.      .      . clonazePAM (KLONOPIN) 0.5 MG tablet Take 0.5 mg by mouth 3  (three) times daily as needed.    . diltiazem (CARTIA XT) 240 MG 24 hr capsule Take 1 capsule (240 mg total) by mouth daily.    .             Review of Systems     Objective:   Physical Exam  Vitals reviewed. Constitutional: She is oriented to person, place, and time. No distress.  HENT:  Head: Normocephalic and atraumatic.  Mouth/Throat: Oropharynx is clear and moist. No oropharyngeal exudate.  Eyes: Pupils are equal, round, and reactive to light. No scleral icterus.  Neck: Normal range of motion. Neck supple.  Cardiovascular: Normal rate and regular rhythm.   Pulmonary/Chest: Effort normal and breath sounds normal. No respiratory distress.  Abdominal: Soft. Bowel sounds are normal. She exhibits no distension. There is tenderness. There is no rebound and no guarding.       MILD TTP IN THE PERIUMBILICAL REGION  Musculoskeletal: She exhibits no edema.  Neurological: She is alert and oriented to person, place, and time.       NO FOCAL DEFICITS   Psychiatric: She has a normal mood and affect.          Assessment & Plan:

## 2012-05-11 NOTE — Plan of Care (Signed)
Problem: Consults Goal: General Medical Patient Education See Patient Education Module for specific education. Outcome: Progressing Patient admitted with hypotension, understands methods to maintain adequate blood pressure Goal: Skin Care Protocol Initiated - if indicated If consults are not indicated, leave blank or document N/A Outcome: Progressing Skin condition is good, color is yellowish/tan, turgor is poor.  Patient weighed 147 lbs in February of 2013 and at that time had diverticulitis and was placed on clears and since then has not been able to regain her appetitie. Goal: Nutrition Consult-if indicated Outcome: Progressing Nutrition consult is needed, patient is alert and understands the concern over the loss of weight.  Problem: Phase I Progression Outcomes Goal: Pain controlled with appropriate interventions Outcome: Progressing HA in the ED has resolved with tylenol Goal: OOB as tolerated unless otherwise ordered Outcome: Completed/Met Date Met:  05/11/12 Patient is steady on her feet, oob to bsc with minimal assist with lines Goal: Hemodynamically stable Outcome: Progressing Vitals have remained stable since admission

## 2012-05-12 ENCOUNTER — Encounter (HOSPITAL_COMMUNITY): Payer: Self-pay | Admitting: Physician Assistant

## 2012-05-12 ENCOUNTER — Inpatient Hospital Stay (HOSPITAL_COMMUNITY): Payer: Medicare Other

## 2012-05-12 DIAGNOSIS — R0989 Other specified symptoms and signs involving the circulatory and respiratory systems: Secondary | ICD-10-CM | POA: Diagnosis present

## 2012-05-12 DIAGNOSIS — N289 Disorder of kidney and ureter, unspecified: Secondary | ICD-10-CM | POA: Diagnosis present

## 2012-05-12 DIAGNOSIS — K5900 Constipation, unspecified: Secondary | ICD-10-CM | POA: Diagnosis not present

## 2012-05-12 DIAGNOSIS — N182 Chronic kidney disease, stage 2 (mild): Secondary | ICD-10-CM | POA: Diagnosis present

## 2012-05-12 DIAGNOSIS — I059 Rheumatic mitral valve disease, unspecified: Secondary | ICD-10-CM | POA: Diagnosis present

## 2012-05-12 DIAGNOSIS — J438 Other emphysema: Secondary | ICD-10-CM | POA: Diagnosis not present

## 2012-05-12 DIAGNOSIS — F17200 Nicotine dependence, unspecified, uncomplicated: Secondary | ICD-10-CM | POA: Diagnosis present

## 2012-05-12 DIAGNOSIS — F411 Generalized anxiety disorder: Secondary | ICD-10-CM | POA: Diagnosis present

## 2012-05-12 DIAGNOSIS — Z720 Tobacco use: Secondary | ICD-10-CM | POA: Diagnosis present

## 2012-05-12 DIAGNOSIS — I4891 Unspecified atrial fibrillation: Secondary | ICD-10-CM | POA: Diagnosis not present

## 2012-05-12 DIAGNOSIS — G4730 Sleep apnea, unspecified: Secondary | ICD-10-CM | POA: Diagnosis present

## 2012-05-12 DIAGNOSIS — Z9089 Acquired absence of other organs: Secondary | ICD-10-CM | POA: Diagnosis not present

## 2012-05-12 DIAGNOSIS — Z950 Presence of cardiac pacemaker: Secondary | ICD-10-CM | POA: Diagnosis not present

## 2012-05-12 DIAGNOSIS — E860 Dehydration: Secondary | ICD-10-CM | POA: Diagnosis present

## 2012-05-12 DIAGNOSIS — R634 Abnormal weight loss: Secondary | ICD-10-CM | POA: Diagnosis not present

## 2012-05-12 DIAGNOSIS — I495 Sick sinus syndrome: Secondary | ICD-10-CM | POA: Diagnosis not present

## 2012-05-12 DIAGNOSIS — I2510 Atherosclerotic heart disease of native coronary artery without angina pectoris: Secondary | ICD-10-CM | POA: Diagnosis not present

## 2012-05-12 DIAGNOSIS — E1169 Type 2 diabetes mellitus with other specified complication: Secondary | ICD-10-CM | POA: Diagnosis present

## 2012-05-12 DIAGNOSIS — I129 Hypertensive chronic kidney disease with stage 1 through stage 4 chronic kidney disease, or unspecified chronic kidney disease: Secondary | ICD-10-CM | POA: Diagnosis present

## 2012-05-12 DIAGNOSIS — Z7901 Long term (current) use of anticoagulants: Secondary | ICD-10-CM | POA: Diagnosis not present

## 2012-05-12 DIAGNOSIS — J449 Chronic obstructive pulmonary disease, unspecified: Secondary | ICD-10-CM | POA: Diagnosis present

## 2012-05-12 DIAGNOSIS — D649 Anemia, unspecified: Secondary | ICD-10-CM | POA: Diagnosis present

## 2012-05-12 DIAGNOSIS — K219 Gastro-esophageal reflux disease without esophagitis: Secondary | ICD-10-CM | POA: Diagnosis present

## 2012-05-12 DIAGNOSIS — I959 Hypotension, unspecified: Secondary | ICD-10-CM | POA: Diagnosis not present

## 2012-05-12 LAB — GLUCOSE, CAPILLARY
Glucose-Capillary: 85 mg/dL (ref 70–99)
Glucose-Capillary: 152 mg/dL — ABNORMAL HIGH (ref 70–99)

## 2012-05-12 LAB — TSH: TSH: 3.301 u[IU]/mL (ref 0.350–4.500)

## 2012-05-12 LAB — HEMOGLOBIN A1C: Mean Plasma Glucose: 108 mg/dL (ref <117)

## 2012-05-12 LAB — BASIC METABOLIC PANEL
Chloride: 111 mEq/L (ref 96–112)
Creatinine, Ser: 1.2 mg/dL — ABNORMAL HIGH (ref 0.50–1.10)
GFR calc Af Amer: 50 mL/min — ABNORMAL LOW (ref >90)

## 2012-05-12 LAB — CBC
HCT: 31.6 % — ABNORMAL LOW (ref 36.0–46.0)
Platelets: 150 10*3/uL (ref 150–400)
RDW: 13.3 % (ref 11.5–15.5)
WBC: 6.8 10*3/uL (ref 4.0–10.5)

## 2012-05-12 LAB — PROTIME-INR: Prothrombin Time: 27.6 seconds — ABNORMAL HIGH (ref 11.6–15.2)

## 2012-05-12 LAB — T4, FREE: Free T4: 1.25 ng/dL (ref 0.80–1.80)

## 2012-05-12 MED ORDER — SODIUM CHLORIDE 0.9 % IV BOLUS (SEPSIS)
500.0000 mL | Freq: Once | INTRAVENOUS | Status: AC
  Administered 2012-05-12: 500 mL via INTRAVENOUS

## 2012-05-12 MED ORDER — OXYCODONE HCL 5 MG PO TABS
5.0000 mg | ORAL_TABLET | ORAL | Status: DC | PRN
  Administered 2012-05-12 – 2012-05-13 (×4): 5 mg via ORAL
  Filled 2012-05-12 (×2): qty 1
  Filled 2012-05-12: qty 8
  Filled 2012-05-12 (×2): qty 1

## 2012-05-12 MED ORDER — METOPROLOL TARTRATE 25 MG PO TABS
25.0000 mg | ORAL_TABLET | Freq: Two times a day (BID) | ORAL | Status: DC
  Administered 2012-05-12 – 2012-05-13 (×2): 25 mg via ORAL
  Filled 2012-05-12 (×2): qty 1

## 2012-05-12 MED ORDER — ENSURE COMPLETE PO LIQD
237.0000 mL | Freq: Two times a day (BID) | ORAL | Status: DC
  Administered 2012-05-12 – 2012-05-13 (×2): 237 mL via ORAL

## 2012-05-12 MED ORDER — WARFARIN SODIUM 1 MG PO TABS
1.0000 mg | ORAL_TABLET | Freq: Once | ORAL | Status: AC
  Administered 2012-05-12: 1 mg via ORAL
  Filled 2012-05-12: qty 1

## 2012-05-12 MED ORDER — IOHEXOL 300 MG/ML  SOLN
64.0000 mL | Freq: Once | INTRAMUSCULAR | Status: AC | PRN
  Administered 2012-05-12: 64 mL via INTRAVENOUS

## 2012-05-12 MED ORDER — METOPROLOL TARTRATE 25 MG PO TABS
12.5000 mg | ORAL_TABLET | Freq: Two times a day (BID) | ORAL | Status: DC
  Administered 2012-05-12: 12.5 mg via ORAL
  Filled 2012-05-12: qty 1

## 2012-05-12 NOTE — Progress Notes (Signed)
UR Chart Review Completed  

## 2012-05-12 NOTE — Progress Notes (Signed)
INITIAL ADULT NUTRITION ASSESSMENT Date: 05/12/2012   Time: 1:16 PM Reason for Assessment: Unplanned weight loss  ASSESSMENT: Female 77 y.o.  Dx: Hypotension   Past Medical History  Diagnosis Date  . Permanent atrial fibrillation     H/o tachybradycardia syndrome on Coumadin anticoagulation, has pacemaker.  [name] CAD (coronary artery disease) 2011    Catheterization August 2011: mild nonobstructive coronary artery disease complicated by large left rectus sheath hematoma status post evacuation Coumadin restarted without recurrence  . Diabetes mellitus   . Chronic airway obstruction, not elsewhere classified   . Anemia, unspecified   . Chronic kidney disease, stage II (mild)   . Unspecified hypertensive kidney disease with chronic kidney disease stage I through stage IV, or unspecified   . Tachycardia-bradycardia     s/p St. Jude single chamber PPM 10/2010   . GERD (gastroesophageal reflux disease)   . Abnormal CT of the chest     Mediastinal and hilar adenopathy followed by Dr. Koleen Nimrod in [place]  . Congestive heart failure, unspecified     EF now 60%, previous nonischemic cardiomyopathy  . Pacemaker     Single chamber [place]. Jude ACCENT [phone] SN 719 04/11/1959 10/31/2010  . Valvular heart disease     a. H/o moderate mitral stenosis by cath 2011 but no mention of this on 10/2011 echo. b. 10/2011 echo: mod MR, mild AS, mild TR.  [name] Hypercholesterolemia   . Diverticulitis Dec 2012  . Body mass index (BMI) of 20.0-20.9 in adult FEB 2013 147 LBS  . Recurrent UTI   . Neurogenic sleep apnea     Uses noctural oxygen prescribed by her pulmonologi  . Rheumatic heart disease   . Gastritis     By EGD    Scheduled Meds:   . acetaminophen  650 mg Oral Once  . cefTRIAXone (ROCEPHIN) IVPB 1 gram/50 mL D5W  1 g Intravenous Once  . dextrose  12.5 g Intravenous Once  . Fluticasone-Salmeterol  1 puff Inhalation Q12H  . influenza  inactive virus vaccine  0.5 mL Intramuscular Tomorrow-1000  .  insulin aspart  0-9 Units Subcutaneous TID WC  . metoprolol tartrate  12.5 mg Oral BID  . montelukast  10 mg Oral QHS  . pantoprazole  80 mg Oral Q1200  . simvastatin  10 mg Oral QHS  . sodium chloride  250 mL Intravenous Once  . sodium chloride  500 mL Intravenous Once  . tiotropium  18 mcg Inhalation Daily  . warfarin  1 mg Oral ONCE-1800  . warfarin  2 mg Oral NOW  . Warfarin - Pharmacist Dosing Inpatient   Does not apply q1800  . DISCONTD: sodium chloride   Intravenous STAT  . DISCONTD: digoxin  125 mcg Oral Daily  . DISCONTD: enoxaparin (LOVENOX) injection  30 mg Subcutaneous Q24H  . DISCONTD: enoxaparin (LOVENOX) injection  40 mg Subcutaneous Q24H  . DISCONTD: furosemide  40 mg Oral Daily   Continuous Infusions:   . sodium chloride 100 mL/hr at 05/12/12 0700   PRN Meds:.acetaminophen, acetaminophen, albuterol, bisacodyl, clonazePAM, iohexol, ondansetron (ZOFRAN) IV, oxyCODONE, polyethylene glycol, traZODone  Ht: 5\' 3"  (160 cm)  Wt: 130 lb 1.1 oz (59 kg)  Ideal Wt: 52.4 kg  % Ideal Wt: 113%  Usual Wt: 152# January 2013  % Usual Wt: 86%  Wt Readings from Last 10 Encounters:  05/12/12 130 lb 1.1 oz (59 kg)  05/11/12 123 lb 12.8 oz (56.155 kg)  04/13/12 130 lb 9.6 oz (59.24 kg)  12/19/11 138  lb (62.596 kg)  12/03/11 140 lb (63.504 kg)  11/18/11 147 lb (66.679 kg)  11/04/11 139 lb 3.2 oz (63.141 kg)  10/26/11 139 lb (63.05 kg)  10/21/11 141 lb 3.2 oz (64.048 kg)  10/19/11 144 lb 12.8 oz (65.681 kg)     Body mass index is 23.04 kg/(m^2). Normal range  Food/Nutrition Related Hx: Pt has hx of poor appetite and unplanned wt loss 22# 14% x 9 months. Hx includes diverticulitis (Dec 2012), gastritis per EGD (10/13/11), constipation, depression and recurrent UTI.  Pt reportedly eats 3 meals per day and snacks. Home diet is Regular, Diet hx: eggs, toast, and coffee at breakfast, sandwich and diet Dr Malachi Bonds at lunch and various hot meal at dinner. Peanut butter crackers for  snack. She is edentulous but denies chewing difficulty. Feeds herself. Ate 100% of breakfast this a.m. CHO Modified diet. Hx of DM; pt reports fasting glucose usually ~102mg /dl. Wt loss etiology unknown at this time, workup in progress per MD. Based on recent po intake and pt diet hx review; she does not meet criteria for malnutrition at this time.   CMP     Component Value Date/Time   NA 144 05/12/2012 0450   NA 139 04/02/2012 0739   K 3.7 05/12/2012 0450   K 4.5 04/02/2012 0739   CL 111 05/12/2012 0450   CL 103 04/02/2012 0739   CO2 23 05/12/2012 0450   CO2 28 04/02/2012 0739   GLUCOSE 75 05/12/2012 0450   BUN 28* 05/12/2012 0450   BUN 30* 04/02/2012 0739   CREATININE 1.20* 05/12/2012 0450   CREATININE 1.47* 04/13/2012 1320   CALCIUM 8.6 05/12/2012 0450   CALCIUM 9.5 04/02/2012 0739   PROT 7.5 05/11/2012 1552   PROT 7.7 04/02/2012 0739   ALBUMIN 4.0 05/11/2012 1552   AST 23 05/11/2012 1552   AST 56 04/02/2012 0739   ALT 44* 05/11/2012 1552   ALKPHOS 125* 05/11/2012 1552   ALKPHOS 90 04/02/2012 0739   BILITOT 0.4 05/11/2012 1552   BILITOT 0.6 04/02/2012 0739   GFRNONAA 43* 05/12/2012 0450   GFRAA 50* 05/12/2012 0450    CBG (last 3)   Basename 05/12/12 1140 05/12/12 0749 05/11/12 2218  GLUCAP 114* 85 117*    Diet Order: Carb Control  Supplements/Tube Feeding:  IVF:    sodium chloride Last Rate: 100 mL/hr at 05/12/12 0700    Estimated Nutritional Needs:   Kcal:1500-1770 kcal Protein:65-75 gr Fluid:1 ml/kcal  NUTRITION DIAGNOSIS: Unplanned weight loss  RELATED TO: etiology unknown  AS EVIDENCE BY: 22#,14% wt loss since January   MONITORING/EVALUATION(Goals): Monitor % meal intake and wt status, results of workup r/t unplanned wt loss Goal: Pt to meet >/= 90% of their estimated nutrition needs; met   EDUCATION NEEDS: -No education needs identified at this time  INTERVENTION: Ensure Complete po BID, each supplement provides 350 kcal and 13 grams of protein RD to follow for  nutrition needs  Dietitian [phone]  DOCUMENTATION CODES Per approved criteria  -Not Applicable    Frederik Schmidt 05/12/2012, 1:16 PM

## 2012-05-12 NOTE — Progress Notes (Signed)
Report given to k. Malva Cogan, Therapist, sports. Patient alert, oriented and in stable condition at the time of transport. Patient being transferred to tele room 212.

## 2012-05-12 NOTE — Progress Notes (Signed)
[place] for Coumadin Indication: atrial fibrillation  Allergies  Allergen Reactions  . Tramadol Nausea Only   Patient Measurements: Height: 5\' 3"  (160 cm) Weight: 130 lb 1.1 oz (59 kg) IBW/kg (Calculated) : 52.4   Vital Signs: Temp: 98.2 F (36.8 C) (10/03 0400) Temp src: Oral (10/03 0400) BP: 98/36 mmHg (10/03 0600) Pulse Rate: 60  (10/03 0600)  Labs:  Basename 05/12/12 0450 05/11/12 1552  HGB 10.4* 12.5  HCT 31.6* 37.3  PLT 150 193  APTT -- --  LABPROT 27.6* 23.4*  INR 2.73* 2.19*  HEPARINUNFRC -- --  CREATININE 1.20* 1.70*  CKTOTAL -- --  CKMB -- --  TROPONINI -- <0.30   Estimated Creatinine Clearance: 33.5 ml/min (by C-G formula based on Cr of 1.2).  Medical History: Past Medical History  Diagnosis Date  . Permanent atrial fibrillation     Tachybradycardia syndrome on Coumadin anticoagulation  . CAD (coronary artery disease) 2011    Catheterization August 2011 mild nonobstructive coronary artery disease complicated by large left rectus sheath hematoma status post evacuation Coumadin restarted without recurrence  . Diabetes mellitus   . Chronic airway obstruction, not elsewhere classified   . Type II or unspecified type diabetes mellitus without mention of complication, not stated as uncontrolled   . Anemia, unspecified   . Chronic kidney disease, stage II (mild)   . Unspecified hypertensive kidney disease with chronic kidney disease stage I through stage IV, or unspecified   . Tachycardia-bradycardia     s/p PPM by Dr Caryl Comes  . GERD (gastroesophageal reflux disease)   . Abnormal CT of the chest     Mediastinal and hilar adenopathy followed by Dr. Koleen Nimrod in [place]  . Congestive heart failure, unspecified     EF now 60%, previous nonischemic cardiomyopathy  . Pacemaker     Single chamber [place]. Jude ACCENT [phone] SN 719 04/11/1959 10/31/2010  . Mitral stenosis     Felt to be moderate by catheterization, but TEE only  show mild mitral stenosis. Also no significant mitral regurgitation.  . Hypercholesterolemia   . Diverticulitis Dec 2012  . Body mass index (BMI) of 20.0-20.9 in adult FEB 2013 147 LBS   Medications:  Scheduled:     . acetaminophen  650 mg Oral Once  . cefTRIAXone (ROCEPHIN) IVPB 1 gram/50 mL D5W  1 g Intravenous Once  . dextrose  12.5 g Intravenous Once  . Fluticasone-Salmeterol  1 puff Inhalation Q12H  . furosemide  40 mg Oral Daily  . influenza  inactive virus vaccine  0.5 mL Intramuscular Tomorrow-1000  . insulin aspart  0-9 Units Subcutaneous TID WC  . montelukast  10 mg Oral QHS  . pantoprazole  80 mg Oral Q1200  . simvastatin  10 mg Oral QHS  . sodium chloride  250 mL Intravenous Once  . sodium chloride  500 mL Intravenous Once  . tiotropium  18 mcg Inhalation Daily  . warfarin  1 mg Oral ONCE-1800  . warfarin  2 mg Oral NOW  . Warfarin - Pharmacist Dosing Inpatient   Does not apply q1800  . DISCONTD: sodium chloride   Intravenous STAT  . DISCONTD: digoxin  125 mcg Oral Daily  . DISCONTD: enoxaparin (LOVENOX) injection  30 mg Subcutaneous Q24H  . DISCONTD: enoxaparin (LOVENOX) injection  40 mg Subcutaneous Q24H   Assessment: 77 yo F on chronic warfarin 1mg  daily except 2mg  on Wed for Afib. INR therapeutic x 2 consecutive days. No bleeding noted. Will  d/c Lovenox and continue Coumadin.  Goal of Therapy:  INR 2-3   Plan:  Coumadin 1mg  po x1 dose tonight Daily INR D/C Lovenox  Dayshia Ballinas A 05/12/2012,7:58 AM

## 2012-05-12 NOTE — Progress Notes (Signed)
Chart reviewed. Discussed with cardiology PA. Discussed with Dr. Megan Salon.  Subjective: Feels stronger. He has had a cough over the past month. Appetite is not what it was, but ate her entire breakfast this morning. No hemoptysis. Still smoking cigarettes. Reports that she has been depressed recently. Refused antidepressants recommended by her primary care provider.  Objective: Vital signs in last 24 hours: Filed Vitals:   05/12/12 0600 05/12/12 0700 05/12/12 0730 05/12/12 0817  BP: 98/36 98/42    Pulse: 60 60    Temp:    97.9 F (36.6 C)  TempSrc:    Oral  Resp: 16 18    Height:      Weight: 59 kg (130 lb 1.1 oz)     SpO2: 97% 98% 95%    Weight change:   Intake/Output Summary (Last 24 hours) at 05/12/12 0912 Last data filed at 05/12/12 0700  Gross per 24 hour  Intake   1920 ml  Output    900 ml  Net   1020 ml   telemetry: Paced rhythm.  General: Alert, oriented, nontoxic, comfortable. Lungs clear to auscultation bilaterally without wheeze rhonchi or rales Cardiovascular regular rate rhythm without murmurs gallops rubs Abdomen soft nontender nondistended Extremities no clubbing cyanosis or edema Psychiatric: Normal affect. Good eye contact.  Lab Results: Basic Metabolic Panel:  Lab [id number] 0450 05/11/12 1552  NA 144 140  K 3.7 3.7  CL 111 102  CO2 23 26  GLUCOSE 75 83  BUN 28* 33*  CREATININE 1.20* 1.70*  CALCIUM 8.6 9.6  MG -- --  PHOS -- --   Liver Function Tests:  Lab 05/11/12 1552  AST 23  ALT 44*  ALKPHOS 125*  BILITOT 0.4  PROT 7.5  ALBUMIN 4.0   No results found for this basename: LIPASE:2,AMYLASE:2 in the last 168 hours No results found for this basename: AMMONIA:2 in the last 168 hours CBC:  Lab 05/12/12 0450 05/11/12 1552  WBC 6.8 10.1  NEUTROABS -- 6.3  HGB 10.4* 12.5  HCT 31.6* 37.3  MCV 96.3 96.4  PLT 150 193   Cardiac Enzymes:  Lab 05/11/12 1552  CKTOTAL --  CKMB --  CKMBINDEX --  TROPONINI <0.30   BNP: No results found  for this basename: PROBNP:3 in the last 168 hours D-Dimer: No results found for this basename: DDIMER:2 in the last 168 hours CBG:  Lab 05/11/12 2218 05/11/12 1923 05/11/12 1838 05/11/12 1752  GLUCAP 117* 152* 65* 52*   Hemoglobin A1C: No results found for this basename: HGBA1C in the last 168 hours Fasting Lipid Panel: No results found for this basename: CHOL,HDL,LDLCALC,TRIG,CHOLHDL,LDLDIRECT in the last 168 hours Thyroid Function Tests: No results found for this basename: TSH,T4TOTAL,FREET4,T3FREE,THYROIDAB in the last 168 hours Coagulation:  Lab 05/12/12 0450 05/11/12 1552  LABPROT 27.6* 23.4*  INR 2.73* 2.19*   Anemia Panel: No results found for this basename: VITAMINB12,FOLATE,FERRITIN,TIBC,IRON,RETICCTPCT in the last 168 hours Urine Drug Screen: Drugs of Abuse  No results found for this basename: labopia, cocainscrnur, labbenz, amphetmu, thcu, labbarb    Alcohol Level: No results found for this basename: ETH:2 in the last 168 hours Urinalysis:  Lab 05/11/12 1841  COLORURINE YELLOW  LABSPEC 1.025  PHURINE 5.5  GLUCOSEU NEGATIVE  HGBUR NEGATIVE  BILIRUBINUR NEGATIVE  KETONESUR NEGATIVE  PROTEINUR NEGATIVE  UROBILINOGEN 0.2  NITRITE NEGATIVE  LEUKOCYTESUR MODERATE*   Micro Results: Recent Results (from the past 240 hour(s))  MRSA PCR SCREENING     Status: Normal   Collection Time  05/11/12  9:49 PM      Component Value Range Status Comment   MRSA by PCR NEGATIVE  NEGATIVE Final    Studies/Results: Dg Chest Port 1 View  05/11/2012  *RADIOLOGY REPORT*  Clinical Data: Hypotension.  PORTABLE CHEST - 1 VIEW  Comparison: 02/02/2012  Findings: Single view of the chest demonstrates a left single lead cardiac pacemaker.  Heart size is within normal limits.  Lungs are clear without airspace disease or edema.  The trachea is midline. Stable focus of sclerosis in the proximal right humerus could represent an enchondroma or bone infarct.  IMPRESSION: No acute  cardiopulmonary disease.   Original Report Authenticated By: Markus Daft, M.D.    Scheduled Meds:   . acetaminophen  650 mg Oral Once  . cefTRIAXone (ROCEPHIN) IVPB 1 gram/50 mL D5W  1 g Intravenous Once  . dextrose  12.5 g Intravenous Once  . Fluticasone-Salmeterol  1 puff Inhalation Q12H  . influenza  inactive virus vaccine  0.5 mL Intramuscular Tomorrow-1000  . insulin aspart  0-9 Units Subcutaneous TID WC  . metoprolol tartrate  12.5 mg Oral BID  . montelukast  10 mg Oral QHS  . pantoprazole  80 mg Oral Q1200  . simvastatin  10 mg Oral QHS  . sodium chloride  250 mL Intravenous Once  . sodium chloride  500 mL Intravenous Once  . tiotropium  18 mcg Inhalation Daily  . warfarin  1 mg Oral ONCE-1800  . warfarin  2 mg Oral NOW  . Warfarin - Pharmacist Dosing Inpatient   Does not apply q1800  . DISCONTD: sodium chloride   Intravenous STAT  . DISCONTD: digoxin  125 mcg Oral Daily  . DISCONTD: enoxaparin (LOVENOX) injection  30 mg Subcutaneous Q24H  . DISCONTD: enoxaparin (LOVENOX) injection  40 mg Subcutaneous Q24H  . DISCONTD: furosemide  40 mg Oral Daily   Continuous Infusions:   . sodium chloride 100 mL/hr at 05/12/12 0600   PRN Meds:.acetaminophen, acetaminophen, albuterol, bisacodyl, clonazePAM, ondansetron (ZOFRAN) IV, oxyCODONE, polyethylene glycol, traZODone Assessment/Plan: Principal Problem:  *Hypotension resolved. Transfer to telemetry. Metoprolol is being resumed per cardiology. Active Problems:  Weight loss, 25 pounds. Had a CT of the abdomen and pelvis recently. Had upper and lower endoscopy. Has a negative chest x-ray. Still smokes. Will check CT chest to rule out malignancy. TSH pending. If workup negative, could be depression related.  DM medications he'll do to hypoglycemia  ANEMIA, at least in part dilutional  HYPERTENSION  Atrial fibrillation, ON Coumadin  Hypoglycemia, none further  Mitral stenosis  COPD  Tachycardia-bradycardia syndrome  Chronic  anticoagulation  Anxiety  CAD (coronary artery disease)  Tobacco abuse, counseled against.   LOS: 1 day   Brunetta Newingham L 05/12/2012, 9:12 AM

## 2012-05-12 NOTE — Evaluation (Signed)
Physical Therapy Evaluation Patient Details Name: Jaima Adornetto MRN: [id number] DOB: 1935/04/28 Today's Date: 05/12/2012 Time: [id number] PT Time Calculation (min): 24 min  PT Assessment / Plan / Recommendation Clinical Impression  Pt appears to be at prior level of functioning.   Pt will be seen one to two more times to access safety of ambulation with quad cane as well as stairs.    PT Assessment  Patient needs continued PT services    Follow Up Recommendations  No PT follow up    Barriers to Discharge  none      Equipment Recommendations     none  Recommendations for Other Services   none  Frequency Min 2X/week    Precautions / Restrictions Precautions Precautions: None Restrictions Weight Bearing Restrictions: No   Pertinent Vitals/Pain Chronic hip pain 5/10  R     Mobility  Bed Mobility Bed Mobility: Supine to Sit Supine to Sit: 7: Independent Transfers Transfers: Sit to Stand Sit to Stand: 7: Independent Ambulation/Gait Ambulation/Gait Assistance: 6: Modified independent (Device/Increase time) Ambulation Distance (Feet): 120 Feet Assistive device: Rolling walker Gait Pattern: Within Functional Limits       Exercises General Exercises - Lower Extremity Ankle Circles/Pumps: Strengthening;Both;10 reps Quad Sets: Strengthening;Both;10 reps Gluteal Sets: Strengthening;Both;10 reps Straight Leg Raises: Strengthening;Both;5 reps   PT Diagnosis: Generalized weakness  PT Problem List: Decreased activity tolerance PT Treatment Interventions: Gait training;Stair training   PT Goals Acute Rehab PT Goals PT Goal Formulation: With patient Pt will Ambulate: 51 - 150 feet;with cane PT Goal: Ambulate - Progress: Goal set today Pt will Go Up / Down Stairs: 3-5 stairs PT Goal: Up/Down Stairs - Progress: Goal set today  Visit Information  Last PT Received On: 05/12/12    Subjective Data  Subjective: Pt states she lives with her granddaughter who is home all  the time.  Uses a quade cane to ambulate withl, Patient Stated Goal: Go home and get some rest   Prior [place] Lives With: Family Available Help at Discharge: Family Type of Home: House Home Access: Stairs to enter Technical brewer of Steps: 5 Entrance Stairs-Rails: Right Home Layout: One level Bathroom Shower/Tub:  (takes sink baths) Bathroom Toilet: Standard Bathroom Accessibility: Yes Home Adaptive Equipment: Quad cane Prior Function Level of Independence: Independent with assistive device(s) Able to Take Stairs?: Yes Driving: No Communication Communication: No difficulties Dominant Hand: Right    Cognition  Overall Cognitive Status: Appears within functional limits for tasks assessed/performed Orientation Level: Appears intact for tasks assessed Behavior During Session: [facility] for tasks performed    Extremity/Trunk Assessment Right Lower Extremity Assessment RLE ROM/Strength/Tone: [facility] for tasks assessed Left Lower Extremity Assessment LLE ROM/Strength/Tone: [facility] for tasks assessed Trunk Assessment Trunk Assessment: Normal   Balance    End of Session PT - End of Session Equipment Utilized During Treatment: Gait belt Activity Tolerance: Patient tolerated treatment well Patient left: in bed  GP     Christphor Groft,CINDY 05/12/2012, 2:30 PM

## 2012-05-12 NOTE — Consult Note (Signed)
CARDIOLOGY CONSULT NOTE  Patient ID: [name], MRN: [id number], DOB/AGE: 09-25-1934 77 y.o. Admit date: 05/11/2012   Date of Consult: 05/12/2012 Primary Physician: [name], FNP Primary Cardiologist: Dr. Fletcher Anon / EP - Dr. Rayann Heman  Chief Complaint: weakness Reason for Consult: possible medication adjustment  HPI: [name] is a 77 y/o F with hx of DM, HTN, chronic atrial fib, rheumatic heart dz, tachy-brady syndrome s/p St. Jude single chamber PPM who presented to [facility] with complaints of weakness and lethargy. She has been treated treated 10 times for urinary tract infections in the past 6 months, mostly her with Cipro. She has lost 26 pounds in the past 6 months. She was recently treated at [place] emergency room with Cipro and Flagyl for acute diverticulits and had nausea/vomiting at that time. She went for f/u appt yesterday with Dr. Oneida Alar and was complaining of dizziness/lethargy for the past week and a half.  During that appointment, her SBP was found to be in the [id number] systolic and she was subsequently sent to the ED. Blood pressure on arrival was 70/50 and came up to the 90s-100s with vigorous IVF. Blood sugar was also found to be 52 shortly after arrival and she was treated with dextrose. She also reports a mostly nonproductive cough for one month. She has not had any falls or near-falls. No presyncope, syncope, LEE, orthopnea, PND, CP, SOB or change in exercise tolerance. No BRBPR, melena or hematemesis. No fever but she has had some chills. She does have occasional palpitations consistent with her prior afib symptoms.  INR is therapeutic. Hgb 12.5->10.4 today (normocytic). UA showed moderate leukocytes and bacteria but was a contaminated specimen with many sq epithelials. BUN/Cr up to 33/1.7 on admit, down to 28/1.20 today. CXR was negative for acute disease. Digoxin level upper limits of normal. The primary team is adjusting diabetic mediations per H&P and is  considering CT of the chest when her renal function improves given h/o smoking. Note that pt had lymphadenopathy on chest CT in 2011, although possibly granulomatous disease.  Cardiac meds at home include Digoxin 0.125mg  (was held), Diltiazem 240mg  (was held), Lasix 40mg  (was continued), Lopressor 25mg  BID (was held).  Past Medical History  Diagnosis Date  . Permanent atrial fibrillation     H/o tachybradycardia syndrome on Coumadin anticoagulation, has pacemaker.  [name] CAD (coronary artery disease) 2011    Catheterization August 2011: mild nonobstructive coronary artery disease complicated by large left rectus sheath hematoma status post evacuation Coumadin restarted without recurrence  . Diabetes mellitus   . Chronic airway obstruction, not elsewhere classified   . Anemia, unspecified   . Chronic kidney disease, stage II (mild)   . Unspecified hypertensive kidney disease with chronic kidney disease stage I through stage IV, or unspecified   . Tachycardia-bradycardia     s/p St. Jude single chamber PPM 10/2010   . GERD (gastroesophageal reflux disease)   . Abnormal CT of the chest     Mediastinal and hilar adenopathy followed by Dr. Koleen Nimrod in [place]  . Congestive heart failure, unspecified     EF now 60%, previous nonischemic cardiomyopathy  . Pacemaker     Single chamber [employer]. Jude ACCENT [phone] [id number] 04/11/1959 10/31/2010  . Valvular heart disease     a. H/o moderate mitral stenosis by cath 2011 but no mention of this on 10/2011 echo. b. 10/2011 echo: mod MR, mild AS, mild TR.  [name] Hypercholesterolemia   . Diverticulitis Dec 2012  . Body mass  index (BMI) of 20.0-20.9 in adult FEB 2013 147 LBS  . Recurrent UTI   . Neurogenic sleep apnea     Uses noctural oxygen prescribed by her pulmonologi  . Rheumatic heart disease   . Gastritis     By EGD     Most Recent Cardiac Studies: 2D Echo 10/2011 Normal LV chamber size. Mild concentric LVH. EF>65%. Abnormal RV septal wall motion c/w  pacemaker. Diastolic function not fully evaluated with afib. LA moderately dilated. +Mitral annular calcification. Mitral valve leaflets mildly thickened. No mention of mitral stenosis. Moderate MR. Mild aortic leaf calfication is visualized. SYstolic excursion of aortic valve cusps is mildly reduced. No AI. Mild AS. Mean gradient 21mmHg. AVA by VTI is 1.22 cm2. RV mildly dilated. Mild TR. RVSP 57mmHg (mild pulm HTN). IVC is dilated.   Cardiac Cath 03/2010 PROCEDURES PERFORMED: 1. Right heart cath. 2. Selective coronary angiography. 3. Left heart cath. 4. Left ventriculogram. DESCRIPTION OF PROCEDURE:  The risks and indication were explained. Consent was signed and placed on the chart.  A 5-French arterial sheath was placed in the right femoral artery.  Standard catheters including a JL-4, no torque right coronary catheter, and an angled pigtail were used, all catheter exchanges were made over wire.  No apparent complications.  A 7-French venous sheath was placed in the right femoral vein, standard Swan-Ganz catheter was used for the right heart cath.  No apparent complications. HEMODYNAMICS:  Right atrial pressure mean of 14.  RV pressure 47/9 with an EDP of 13.  PA pressure 48/28 with a mean of 38.  Pulmonary capillary wedge pressure mean of 27 with a V-wave of 35-40.  Central aortic pressure 115/71 with a mean of 89.  LV pressure 119/14 with an EDP in the 15-20 range.  There was no aortic stenosis.  The mitral valve gradient was mean of 12.5 mmHg.  The calculated mitral valve area of 1.47 centimeter squared. Left main was normal. LAD was a long vessel wrapping the apex gave off three diagonals.  There was a 40% lesion in the proximal LAD just after the takeoff of the first diagonal. Left circumflex was a large vessel gave off small OM-1, moderate-to- large OM-2 and OM-3.  There was a 20% lesion in the midsection of the circumflex. Right coronary artery was moderate-sized dominant  vessel gave off a post PDA.  There was a 20% lesion proximally.  Just before this, there was mild dye hang-up, but no obvious stenosis.  In the mid RCA, there was a 50-60% smooth lesion and the distal RCA 30% lesion. Left ventriculogram was done in the RAO position, showed ventricular dyssynchrony with moderate global hypokinesis and EF of 35-40%.  There was mild mitral regurgitation visually. ASSESSMENT: 1. Mild nonobstructive coronary artery disease as described above. 2. Moderate left ventricular dysfunction. 3. Moderate mitral stenosis with mild-to-moderate mitral     Regurgitation.   Surgical History:  Past Surgical History  Procedure Date  . Single-chamber pacemaker implantation 3/12    by Dr Caryl Comes  . Evacuation of retroperitoneal and left rectus sheath   . Cholecystectomy     40+ years ago  . Hernia repair     X3  . Pilonidal cyst / sinus excision   . Esophagogastroduodenoscopy 10/2011    Fields-erosive gastritis, biopsy with no H. pylori, hiatal hernia, peptic duodenitis, no celiac disease, duodenal diverticulum, duodenal nodule  . Colonoscopy 10/2011    Dyann Ruddle sigmoid to descending diverticulosis, internal hemorrhoids  . Eus 11/16/11  Mishra-13 MM CBD, normal pancreatic duct, otherwise normal EUS    Home Meds: Prior to Admission medications   Medication Sig Start Date End Date Taking? Authorizing Provider  ciprofloxacin (CIPRO) 500 MG tablet Take 500 mg by mouth 2 (two) times daily.   Yes Historical Provider, MD  clonazePAM (KLONOPIN) 0.5 MG tablet Take 0.5 mg by mouth 3 (three) times daily as needed. 12/03/10 06/10/12 Yes Ezra Sites, MD  digoxin (LANOXIN) 0.125 MG tablet Take 1 tablet (125 mcg total) by mouth daily. 09/21/11  Yes Lelon Perla, MD  diltiazem (CARTIA XT) 240 MG 24 hr capsule Take 1 capsule (240 mg total) by mouth daily. 02/06/11 06/10/12 Yes Thompson Grayer, MD  Fluticasone-Salmeterol (ADVAIR DISKUS) 250-50 MCG/DOSE AEPB Inhale 1 puff into the lungs  every 12 (twelve) hours.     Yes Historical Provider, MD  furosemide (LASIX) 40 MG tablet Take 40 mg by mouth daily.     Yes Historical Provider, MD  glyBURIDE (DIABETA) 5 MG tablet Take 5 mg by mouth daily with breakfast.    Yes Historical Provider, MD  HYDROcodone-acetaminophen (NORCO) 5-325 MG per tablet Take 1 tablet by mouth every 6 (six) hours as needed. For pain    Yes Historical Provider, MD  metoprolol tartrate (LOPRESSOR) 25 MG tablet Take 1 tablet (25 mg total) by mouth 2 (two) times daily. 11/05/11  Yes Ezra Sites, MD  omeprazole (PRILOSEC) 40 MG capsule Take 40 mg by mouth daily.     Yes Historical Provider, MD  PROAIR HFA 108 (90 BASE) MCG/ACT inhaler Take 2 puffs by mouth Every 6 hours as needed. For shortness of breath 09/07/11  Yes Historical Provider, MD  simvastatin (ZOCOR) 10 MG tablet Take 1 tablet (10 mg total) by mouth at bedtime. 10/30/11  Yes Wellington Hampshire, MD  tiotropium (SPIRIVA) 18 MCG inhalation capsule Place 18 mcg into inhaler and inhale daily.     Yes Historical Provider, MD  warfarin (COUMADIN) 2 MG tablet Take 1-2 mg by mouth daily. *Take one-half tablet (1mg ) every day in the evening, except take one tablet on Wednesdays only*   Yes Historical Provider, MD  zafirlukast (ACCOLATE) 20 MG tablet Take 20 mg by mouth 2 (two) times daily.     Yes Historical Provider, MD    Inpatient Medications:     . cefTRIAXone (ROCEPHIN) IVPB 1 gram/50 mL D5W  1 g Intravenous Once  . Fluticasone-Salmeterol  1 puff Inhalation Q12H  . furosemide  40 mg Oral Daily  . insulin aspart  0-9 Units Subcutaneous TID WC  . montelukast  10 mg Oral QHS  . pantoprazole  80 mg Oral Q1200  . simvastatin  10 mg Oral QHS  . tiotropium  18 mcg Inhalation Daily  . Warfarin - Pharmacist Dosing Inpatient   Does not apply q1800   Allergies:  Allergies  Allergen Reactions  . Tramadol Nausea Only   History   Social History  . Marital Status: Single    Spouse Name: N/A    Number of  Children: 2  . Years of Education: N/A   Occupational History  . RETIRED    Social History Main Topics  . Smoking status: Former Smoker -- 2.0 packs/day for 45 years    Types: Cigarettes    Quit date: 08/10/2006  . Smokeless tobacco: Never Used  . Alcohol Use: No  . Drug Use: No  . Sexually Active: Not on file   Other Topics Concern  . Not on  file   Social History Narrative  . No narrative on file    Family History  Problem Relation Age of Onset  . Cancer Mother     Uterus  . Cancer Daughter     kidney cancer, in remission  . Heart disease Father   . Colon cancer Neg Hx     Review of Systems: General: negative for fever, night sweats. +chills, weight loss as above Cardiovascular: see above Dermatological: negative for rash Respiratory: +cough, mostly nonproductive (occasional scant grey sputum but no hemoptysis) Urologic: negative for hematuria Abdominal: negative for bright red blood per rectum, melena, or hematemesis. Did have n/v/diarrhea with diverticulitis Neurologic: negative for visual changes, syncope, or dizziness All other systems reviewed and are otherwise negative except as noted above.  Labs:  [facility] 05/11/12 1552  CKTOTAL --  CKMB --  TROPONINI <0.30   Lab Results  Component Value Date   WBC 6.8 05/12/2012   HGB 10.4* 05/12/2012   HCT 31.6* 05/12/2012   MCV 96.3 05/12/2012   PLT 150 05/12/2012     Lab 05/12/12 0450 05/11/12 1552  NA 144 --  K 3.7 --  CL 111 --  CO2 23 --  BUN 28* --  CREATININE 1.20* --  CALCIUM 8.6 --  PROT -- 7.5  BILITOT -- 0.4  ALKPHOS -- 125*  ALT -- 44*  AST -- 23  GLUCOSE 75 --   Radiology/Studies:  Ct Abdomen Pelvis W Contrast 04/14/2012  *RADIOLOGY REPORT*  Clinical Data: Diverticulitis with persistent left-sided pain. History diabetes.  Appendectomy.  CT ABDOMEN AND PELVIS WITH CONTRAST  Technique:  Multidetector CT imaging of the abdomen and pelvis was performed following the standard protocol during bolus  administration of intravenous contrast.  Contrast: 14mL OMNIPAQUE IOHEXOL 300 MG/ML  SOLN  Comparison: 04/02/2012 and 09/01/2011.  Although back the 2011.  Findings: Mild scarring at the left lung base.  Mild cardiomegaly with a pacer in place. No pericardial or pleural effusion. Similar mild intrahepatic biliary ductal dilatation. Mildly prominent caudate and lateral segment left liver lobes.  Old granulomatous disease in the spleen.  Mild pancreatic atrophy. Similar common duct dilatation, measuring up to 1.6 cm in the porta hepatis.  03/29/2010, the common and measured 1.4 cm.  Prior cholecystectomy.  No obstructive stone or mass identified.  Normal adrenal glands.  Interpolar right renal lesion measures 7 mm and is too small to entirely characterize.  Most likely a cyst.  Normal left kidney.  Stable small retroperitoneal nodes, not pathologic by size criteria.  There are mildly prominent porta hepatis nodes, including a 1.3 cm node on image 27, likely reactive.  Extensive colonic diverticulosis with sigmoid muscular hypertrophy. No surrounding inflammation or evidence of pericolonic abscess/free perforation. Colonic stool burden suggests constipation.   Small bowel positioned within an area of ventral abdominal pelvic wall laxity.  This is likely concurrent with prior hernia repair site.  No bowel obstruction. No ascites.  Mildly prominent mesenteric nodes are likely reactive and present over prior exams.  No pelvic adenopathy.    Normal urinary bladder with retroverted uterus.  No adnexal mass.  No significant free fluid.  No acute osseous abnormality.  IMPRESSION:  1. Possible constipation.  No other explanation for left-sided pain identified. 2.  Cholecystectomy with biliary ductal dilatation.  Suspect slight progression since 09/01/2011.  If bilirubin is elevated or there is a clinical concern of ampullary lesion or stenosis, consider ERCP. 3. Nonspecific prominence of the lateral segment left liver lobe and  caudate lobe, with mildly prominent periportal nodes. Correlate with risk factors for liver disease.   Original Report Authenticated By: Areta Haber, M.D.    Dg Chest 05/11/2012  IMPRESSION: No acute cardiopulmonary disease.     EKG: Vpaced 60bpm  Physical Exam: Blood pressure 98/36, pulse 60, temperature 97.9 F (36.6 C), temperature source Oral, resp. rate 16, height 5\' 3"  (1.6 m), weight 130 lb 1.1 oz (59 kg), SpO2 95.00%. General: Well developed thin elderly WF in no acute distress, somewhat sallow complexion Head: Normocephalic, atraumatic, sclera non-icteric, no xanthomas, nares are without discharge.  Neck: Bilateral carotid bruits noted, R>L. JVD not elevated. Lungs: Clear bilaterally to auscultation without wheezes, rales, or rhonchi. Breathing is unlabored. Heart: RRR with S1 S2. Systolic murmur noted most prominent at apex and LLSB, less so at RUSB. No rubs or gallops appreciated. Abdomen: Soft, non-tender, non-distended with normoactive bowel sounds. Well healed occult midline scar. No rebound/guarding. Msk:  Strength is normal 5/5 equally throughout, generalized muscle atrophy noted that may be age related. Extremities: No clubbing or cyanosis. No edema.  Distal pedal pulses are 2+ and equal bilaterally.  Chronic stasis changes Neuro: Alert and oriented X 3. No facial asymmetry. No focal deficit. Moves all extremities spontaneously. Psych:  Responds to questions appropriately with a normal affect.   Assessment and Plan:  77 y/o F with hx of DM, nonobstructive CAD, atrial fibrillation with tachybrady s/p PPM, recurrent UTI, recent diverticulitis presented to [facility] with weakness and lethargy and found to be hypotensive with SBP 70/50, 26lb weight loss, glucose ? 52, and digoxin level WNL (upper limits, consistent with prior levels).  1. Hypotension 2. DM with hypoglycemia 3. Weight loss of unclear etiology 4. ID: recent diverticulitis followed by GI, recurrent UTI with possible  referral to urology pending 5. Acute on chronic renal insufficiency 6. Atrial fibrillation with tachybrady syndrome s/p PPM, currently V-paced 7. Anemia - unclear if dilutional; will need to be followed (Hgb of 12.5 may represent state of dehydration given ARI) 8. Valvular heart disease - known mod MR, mild TR, mild AS by echo 10/2011 9. Cerebrovascular disease-Bilateral carotid bruits  The overall clinical picture is not clearly related to a primary cardiac issue but in light of hypotension, agree with need for medication adjustment. Appreciate IM's workup of her weight loss and weakness. Note that digoxin level was WNL and has been discontinued -- will defer to Dr. Lattie Haw regarding decision to restart (would need close f/u of level as renal function fluctuates). This morning I would recommend holding her Lasix as she is responding well to IVF in regards to BP. She has no signs of CHF. Restart low-dose metoprolol to prevent rebound tachycardia and watch blood pressure. Given hypotension and history of device implantation, will also obtain blood cultures to r/o occult infection. Follow Hgb. Consider carotid dopplers which may be able to be done as an outpatient. With regards to weakness and her Coumadin, she may benefit from PT eval. She denies any falls or near falls but this will need to be followed. Will discuss further management including possible repeat echo with Dr. Lattie Haw.   [name] Dunn PA-C 05/12/2012, 8:26 AM  Cardiology Attending Patient interviewed and examined. Discussed with [name] Dunn PA-C.  Above note annotated and modified based upon my findings.  Patient has a history of sick sinus syndrome with difficult to control ventricular rate in atrial fibrillation; however, off all medication, she has been paced while in the hospital. This may reflect a substantial  accumulation of drug related to acute on chronic renal insufficiency or change in her conduction system.  Blood pressure has  recovered with hydration. Agree with suggestion to restart low-dose beta blocker and add additional medications as required. This can be managed as an outpatient if she is otherwise deemed eligible for discharge.  Furosemide can be held for the time being as well and restarted at a lower dose as needed.  Jacqulyn Ducking, MD 05/12/2012, 6:49 PM

## 2012-05-13 ENCOUNTER — Inpatient Hospital Stay (HOSPITAL_COMMUNITY): Payer: Medicare Other

## 2012-05-13 ENCOUNTER — Other Ambulatory Visit: Payer: Self-pay | Admitting: Physician Assistant

## 2012-05-13 DIAGNOSIS — K5900 Constipation, unspecified: Secondary | ICD-10-CM | POA: Diagnosis not present

## 2012-05-13 DIAGNOSIS — R0989 Other specified symptoms and signs involving the circulatory and respiratory systems: Secondary | ICD-10-CM

## 2012-05-13 DIAGNOSIS — E162 Hypoglycemia, unspecified: Secondary | ICD-10-CM

## 2012-05-13 DIAGNOSIS — I495 Sick sinus syndrome: Secondary | ICD-10-CM

## 2012-05-13 DIAGNOSIS — I050 Rheumatic mitral stenosis: Secondary | ICD-10-CM

## 2012-05-13 DIAGNOSIS — R634 Abnormal weight loss: Secondary | ICD-10-CM

## 2012-05-13 DIAGNOSIS — J449 Chronic obstructive pulmonary disease, unspecified: Secondary | ICD-10-CM

## 2012-05-13 DIAGNOSIS — Z950 Presence of cardiac pacemaker: Secondary | ICD-10-CM

## 2012-05-13 DIAGNOSIS — Z7901 Long term (current) use of anticoagulants: Secondary | ICD-10-CM

## 2012-05-13 DIAGNOSIS — I4891 Unspecified atrial fibrillation: Secondary | ICD-10-CM | POA: Diagnosis not present

## 2012-05-13 DIAGNOSIS — I2510 Atherosclerotic heart disease of native coronary artery without angina pectoris: Secondary | ICD-10-CM | POA: Diagnosis not present

## 2012-05-13 DIAGNOSIS — F17200 Nicotine dependence, unspecified, uncomplicated: Secondary | ICD-10-CM

## 2012-05-13 DIAGNOSIS — I959 Hypotension, unspecified: Secondary | ICD-10-CM | POA: Diagnosis not present

## 2012-05-13 LAB — CBC
HCT: 31.8 % — ABNORMAL LOW (ref 36.0–46.0)
Hemoglobin: 10.5 g/dL — ABNORMAL LOW (ref 12.0–15.0)
MCHC: 33 g/dL (ref 30.0–36.0)
MCV: 96.1 fL (ref 78.0–100.0)

## 2012-05-13 LAB — URINE CULTURE

## 2012-05-13 LAB — PROTIME-INR: INR: 2.63 — ABNORMAL HIGH (ref 0.00–1.49)

## 2012-05-13 LAB — BASIC METABOLIC PANEL
BUN: 18 mg/dL (ref 6–23)
Chloride: 108 mEq/L (ref 96–112)
Creatinine, Ser: 0.94 mg/dL (ref 0.50–1.10)
GFR calc non Af Amer: 58 mL/min — ABNORMAL LOW (ref >90)
Glucose, Bld: 98 mg/dL (ref 70–99)
Potassium: 4 mEq/L (ref 3.5–5.1)

## 2012-05-13 LAB — GLUCOSE, CAPILLARY: Glucose-Capillary: 91 mg/dL (ref 70–99)

## 2012-05-13 MED ORDER — WARFARIN SODIUM 1 MG PO TABS: 1.0000 mg | ORAL_TABLET | Freq: Once | ORAL | Status: DC

## 2012-05-13 NOTE — Progress Notes (Signed)
Patient: [name] Date of Encounter: 05/13/2012, 8:17 AM Admit date: 05/11/2012     Subjective  No CP or SOB. Walked around without dizziness. BP much improved (70 systolic on admit, [id number] today). Still notes some mild RUQ discomfort. No nausea or vomiting. Good appetite today.   Objective   Telemetry: v paced 60's Physical Exam: Filed Vitals:   05/13/12 0607  BP: 120/56  Pulse: 59  Temp: 97.9 F (36.6 C)  Resp: 20   General: Well developed thin elderly WF in no acute distress, somewhat sallow complexion  Head: Normocephalic, atraumatic, sclera non-icteric, no xanthomas, nares are without discharge.  Neck: Bilateral carotid bruits noted, R>L. JVD not elevated.  Lungs: Clear bilaterally to auscultation without wheezes, rales, or rhonchi. Breathing is unlabored.  Heart: RRR with S1 S2. Systolic murmur noted most prominent at apex and LLSB, less so at RUSB. No rubs or gallops appreciated.  Abdomen: Soft, non-distended with normoactive bowel sounds. She did have some RUQ tenderness with her own palpation but not elicited by myself. Well healed occult midline scar. No rebound/guarding.  Msk: Strength is normal 5/5 equally throughout, generalized muscle atrophy noted that may be age related.  Extremities: No clubbing or cyanosis. No edema. Distal pedal pulses are 2+ and equal bilaterally. Chronic stasis changes  Neuro: Alert and oriented X 3. No facial asymmetry. No focal deficit. Moves all extremities spontaneously.  Psych: Responds to questions appropriately with a normal affect.    Intake/Output Summary (Last 24 hours) at 05/13/12 0817 Last data filed at 05/12/12 1700  Gross per 24 hour  Intake    480 ml  Output    300 ml  Net    180 ml    Inpatient Medications:    . feeding supplement  237 mL Oral BID BM  . Fluticasone-Salmeterol  1 puff Inhalation Q12H  . influenza  inactive virus vaccine  0.5 mL Intramuscular Tomorrow-1000  . insulin aspart  0-9 Units Subcutaneous  TID WC  . metoprolol tartrate  25 mg Oral BID  . montelukast  10 mg Oral QHS  . pantoprazole  80 mg Oral Q1200  . simvastatin  10 mg Oral QHS  . tiotropium  18 mcg Inhalation Daily  . warfarin  1 mg Oral ONCE-1800  . Warfarin - Pharmacist Dosing Inpatient   Does not apply q1800  . DISCONTD: furosemide  40 mg Oral Daily  . DISCONTD: metoprolol tartrate  12.5 mg Oral BID    Labs:  [facility] 05/13/12 0614 05/12/12 0450  NA 140 144  K 4.0 3.7  CL 108 111  CO2 25 23  GLUCOSE 98 75  BUN 18 28*  CREATININE 0.94 1.20*  CALCIUM 9.4 8.6  MG -- --  PHOS -- --    Basename 05/11/12 1552  AST 23  ALT 44*  ALKPHOS 125*  BILITOT 0.4  PROT 7.5  ALBUMIN 4.0    Basename 05/13/12 0614 05/12/12 0450 05/11/12 1552  WBC 7.6 6.8 --  NEUTROABS -- -- 6.3  HGB 10.5* 10.4* --  HCT 31.8* 31.6* --  MCV 96.1 96.3 --  PLT 151 150 --    Basename 05/11/12 1552  CKTOTAL --  CKMB --  TROPONINI <0.30    Basename 05/11/12 2200  HGBA1C 5.4    Basename 05/11/12 2200  TSH 3.301  T4TOTAL --  T3FREE --  THYROIDAB --    Radiology/Studies:  Ct Chest W Contrast 05/12/2012  *RADIOLOGY REPORT*  Clinical Data: Weight loss, cough, evaluate for malignancy  CT CHEST WITH CONTRAST  Technique:  Multidetector CT imaging of the chest was performed following the standard protocol during bolus administration of intravenous contrast.  Contrast: 11mL OMNIPAQUE IOHEXOL 300 MG/ML  SOLN  Comparison: CT abdomen/pelvis 04/14/2012, chest x-ray 05/11/2012; chest CT 05/29/2010  Findings:  Mediastinum: Unremarkable thoracic inlet. Stable enlarged precarinal lymph node with central calcifications measures 2.2 by 1.8 cm.  A low right paratracheal node measures 10 mm in short axis. Calcified but not enlarged right hilar lymphoid tissue. Unremarkable thoracic esophagus.  Heart/Vascular: Cardiomegaly with prominent left atrial enlargement.  Left subclavian approach cardiac rhythm maintenance device.  Lead terminates in the right  ventricular apex.  Main pulmonary artery is markedly enlarged at 3.2 cm in diameter.  The central pulmonary arteries are also enlarged.  There is moderate atherosclerotic vascular calcification of the transverse aorta and coronary arteries.  No pericardial effusion.  Lungs/Pleura: Background mild centrilobular emphysema. Stable small focus of bronchiolectasis and calcification in the right upper lobe suggest residual prior infectious/inflammatory process.  No suspicious pulmonary nodule, pulmonary edema, focal airspace consolidation, pleural effusion or pneumothorax.  There is mild dependent atelectasis in the left lower lobe.  Mild lower lobe bronchial wall thickening.  Upper Abdomen: Incompletely imaged mild extrahepatic biliary ductal dilatation.  The common hepatic duct measures 15 mm in diameter the remainder the upper abdomen is unremarkable.  Bones: No acute fracture or aggressive appearing lytic or blastic osseous lesion.  IMPRESSION:  1.  No findings suspicious for malignancy to explain the patient's underlying clinical symptoms.  2.  Stable enlarged and partially calcified mediastinal lymph nodes dating back to October 2011.  Findings likely represent the sequela of old/indolent granulomatous disease.  Treated lymphoma can have a similar appearance.  3.  Mild centrilobular emphysema and lower lobe bronchial wall thickening.  4.  Marked enlargement of the main and central pulmonary arteries as can be seen with pulmonary arterial hypertension.  5.  Atherosclerosis including coronary artery disease.   Original Report Authenticated By: Criselda Peaches, M.D.    Ct Abdomen Pelvis W Contrast  04/14/2012  *RADIOLOGY REPORT*  Clinical Data: Diverticulitis with persistent left-sided pain. History diabetes.  Appendectomy.  CT ABDOMEN AND PELVIS WITH CONTRAST  Technique:  Multidetector CT imaging of the abdomen and pelvis was performed following the standard protocol during bolus administration of intravenous  contrast.  Contrast: 24mL OMNIPAQUE IOHEXOL 300 MG/ML  SOLN  Comparison: 04/02/2012 and 09/01/2011.  Although back the 2011.  Findings: Mild scarring at the left lung base.  Mild cardiomegaly with a pacer in place. No pericardial or pleural effusion. Similar mild intrahepatic biliary ductal dilatation. Mildly prominent caudate and lateral segment left liver lobes.  Old granulomatous disease in the spleen.  Mild pancreatic atrophy. Similar common duct dilatation, measuring up to 1.6 cm in the porta hepatis.  03/29/2010, the common and measured 1.4 cm.  Prior cholecystectomy.  No obstructive stone or mass identified.  Normal adrenal glands.  Interpolar right renal lesion measures 7 mm and is too small to entirely characterize.  Most likely a cyst.  Normal left kidney.  Stable small retroperitoneal nodes, not pathologic by size criteria.  There are mildly prominent porta hepatis nodes, including a 1.3 cm node on image 27, likely reactive.  Extensive colonic diverticulosis with sigmoid muscular hypertrophy. No surrounding inflammation or evidence of pericolonic abscess/free perforation. Colonic stool burden suggests constipation.   Small bowel positioned within an area of ventral abdominal pelvic wall laxity.  This is likely concurrent with prior  hernia repair site.  No bowel obstruction. No ascites.  Mildly prominent mesenteric nodes are likely reactive and present over prior exams.  No pelvic adenopathy.    Normal urinary bladder with retroverted uterus.  No adnexal mass.  No significant free fluid.  No acute osseous abnormality.  IMPRESSION:  1. Possible constipation.  No other explanation for left-sided pain identified. 2.  Cholecystectomy with biliary ductal dilatation.  Suspect slight progression since 09/01/2011.  If bilirubin is elevated or there is a clinical concern of ampullary lesion or stenosis, consider ERCP. 3. Nonspecific prominence of the lateral segment left liver lobe and caudate lobe, with mildly  prominent periportal nodes. Correlate with risk factors for liver disease.   Original Report Authenticated By: Areta Haber, M.D.    Dg Chest Port 1 View  05/11/2012  *RADIOLOGY REPORT*  Clinical Data: Hypotension.  PORTABLE CHEST - 1 VIEW  Comparison: 02/02/2012  Findings: Single view of the chest demonstrates a left single lead cardiac pacemaker.  Heart size is within normal limits.  Lungs are clear without airspace disease or edema.  The trachea is midline. Stable focus of sclerosis in the proximal right humerus could represent an enchondroma or bone infarct.  IMPRESSION: No acute cardiopulmonary disease.   Original Report Authenticated By: Markus Daft, M.D.      Assessment and Plan  77 y/o F with hx of DM, nonobstructive CAD, atrial fibrillation with tachybrady s/p PPM, recurrent UTI, recent diverticulitis presented to [facility] with weakness and lethargy and found to be hypotensive with SBP 70/50, 26lb weight loss, glucose ? 52, and digoxin level WNL (upper limits, consistent with prior levels).   1. Hypotension, improved, likely related to medications 2. DM with hypoglycemia, adjustments being made by IM  3. Weight loss of unclear etiology  4. ID: recent diverticulitis followed by GI, recurrent UTI with possible referral to urology pending  5. Acute on chronic renal insufficiency, improved 6. Atrial fibrillation with tachybrady syndrome s/p PPM, currently V-paced without high rates 7. Anemia - likely dilutional - would consider following by PCP as OP, no evidence of bleeding 8. Valvular heart disease - known mod MR, mild TR, mild AS by echo 10/2011  9. Cerebrovascular disease-Bilateral carotid bruits 10. Coronary artery calcification on CT 11. ?[place] on CT. Had mild pulmonary hypertension by echocardiogram in March.  BP improved with IVF. Tolerated re-addition of BB. No clear etiology by chest CT for weight loss. Note IM mentions possible component of depression. Appreciate their assessment of pt's  abdominal pain - may need close continued GI f/u. Continue to hold Lasix, digoxin, diltiazem for now. BP 98-120 on Metoprolol 25mg  BID. Instructed pt to pay attention to her symptoms and if she begins to notice palpitations, she is to contact our office. Will arrange OP carotid dopplers and f/u in our office in 2 weeks.   Signed, Melina Copa PA-C  Attending note:  Agree with encounter as outlined above. Ms. Idolina Primer and I discussed the case. Note was modified based on my findings and recommendations as well. She is scheduled to be seen in our [place] office.  Satira Sark, M.D., F.A.C.C.

## 2012-05-13 NOTE — Discharge Summary (Signed)
Physician Discharge Summary  Carollyn Schults [id number] DOB: 05-30-1935 DOA: 05/11/2012  PCP: [name], FNP  Admit date: 05/11/2012 Discharge date: 05/13/2012  Recommendations for Outpatient Follow-up:  1. Follow with cardiology.  Discharge Diagnoses:  1. Hypotension, likely related to dehydration, resolved. 2. Hypoglycemia, improved, likely related to #1. Type 2 diabetes mellitus. 3. Weight loss of approximately 25 pounds in the last 6 months or so. Investigations so far negative for malignancy including a CT chest scan on this admission. TSH is within normal limits and not suppressed. No clinical indication of hyperthyroidism. 4. Hypertension, now relatively normotensive with adjustments of medications. Several cardiac drugs have been held for the time being including Lasix, diltiazem. 5. Chronic anticoagulation secondary to atrial fibrillation. 6. Tachybradycardia syndrome. 7. Ongoing tobacco abuse and COPD. 8. Mitral stenosis.   Discharge Condition: Stable and improved.  Diet recommendation: Carbohydrate modified diet  Filed Weights   05/11/12 2217 05/12/12 0600 05/13/12 0607  Weight: 56.7 kg (125 lb) 59 kg (130 lb 1.1 oz) 60.873 kg (134 lb 3.2 oz)    History of present illness:  This 77 year old lady was admitted with symptoms of weakness/lethargy for approximately 10 days. Please see initial history as outlined below: HPI:  [name] is an 77 y.o. female. Elderly Caucasian lady with a history of diabetes, hypertension, chronic atrial fibrillation due to childhood rheumatic heart disease, tachybradycardia syndrome for which she's had a pacemaker, and whose medications include digoxin, Lasix, Lopressor, and diltiazem. She has been treated 10 times for urinary tract infections in the past 6 months, mostly her with Cipro; she has lost 26 pounds in the past 7 months; she was recently treated at [facility] emergency room with Cipro and Flagyl for acute  diverticulitis. She went for followup of diverticulitis today to Dr. Barney Drain, and complained of being lethargic and dizzy for the past 10 days, and during the evaluation her blood pressures was found to be low with systolic in the [id number]. She was sent to [facility] emergency room for further evaluation.  In the emergency room patient was hypotensive on arrival but she responded to vigorous IV fluid hydration; later her blood sugar was found to be 52 and she reported feeling better after intravenous dextrose.  Her granddaughter lives with her reports that she's been lightheaded and dizzy and almost falling when she stands up in the morning, but has had no frank falls or head injury. Patient reports that she's been eating and drinking normally but continues to lose weight. She does not have a history of thyroid problems. Her fasting blood sugars tend to be around 110, and her two-hour postprandial tends to be around 150.  She has had no fever nor chills, no cough or colds or shortness of breath; she does have cramping lower abdominal heaviness which she associates with urinary tract infections, and bilateral posterior flank pain; she does have an appointment to see a urologist.  She reports she uses nocturnal oxygen prescribed by her pulmonologist for neurogenic sleep apnea;  She reports her left leg is always colder than her right.  She sees a podiatrist, Dr. Irving Shows, every 3 months  Hospital Course:  Patient was admitted and several medications were discontinued. She was given intravenous fluids. Digoxin level was checked and this was within normal limits, however in the face of dehydration I would not be surprised if she was actually probably be digoxin toxic. Thyroid function was reviewed in TSH is within normal limits, not suppressed. CT chest scan was done  while she was hospitalized to look for other evidence of malignancy and there was none. She was seen by cardiology who agrees with discontinuing  several medications, however her metoprolol was restarted to avoid rebound tachycardia. Overall, she is improved throughout hospitalization, physical therapy has seen her and felt that there was no further needs at home. She will require close cardiological followup as an outpatient.  Procedures:  None.   Consultations:  Cardiology, Dr. Leanora Cover. Domenic Polite.  Discharge Exam: Filed Vitals:   05/12/12 1923 05/12/12 2114 05/13/12 0607 05/13/12 0725  BP:  113/45 120/56   Pulse:  62 59   Temp:  98 F (36.7 C) 97.9 F (36.6 C)   TempSrc:  Oral    Resp:  16 20   Height:      Weight:   60.873 kg (134 lb 3.2 oz)   SpO2: 100% 100% 98% 98%    General: She looks systemically well. She does not particularly look cachectic at this time. She does not look hyperthyroid. Cardiovascular: Heart sounds are present with systolic murmur compatible with mitral stenosis/regurgitation. Jugular venous pressure not elevated. There is no evidence of heart failure. Respiratory: Lung fields are clear. She is alert and orientated without any focal neurological signs. Abdomen is soft and nontender.  Discharge Instructions  Discharge Orders    Future Appointments: Provider: Department: Dept Phone: Center:   06/03/2012 9:40 AM Aurora Mask, PA [facility] [phone] [email or web address]   06/13/2012 8:10 AM [facility] [employer] [phone] [email or web address]     Future Orders Please Complete By Expires   Diet - low sodium heart healthy      Increase activity slowly          Medication List     As of 05/13/2012  8:36 AM    STOP taking these medications         ciprofloxacin 500 MG tablet   Commonly known as: CIPRO      digoxin 0.125 MG tablet   Commonly known as: LANOXIN      diltiazem 240 MG 24 hr capsule   Commonly known as: CARDIZEM CD      furosemide 40 MG tablet   Commonly known as: LASIX      TAKE these medications         ADVAIR DISKUS 250-50 MCG/DOSE  Aepb   Generic drug: Fluticasone-Salmeterol   Inhale 1 puff into the lungs every 12 (twelve) hours.      clonazePAM 0.5 MG tablet   Commonly known as: KLONOPIN   Take 0.5 mg by mouth 3 (three) times daily as needed.      glyBURIDE 5 MG tablet   Commonly known as: DIABETA   Take 5 mg by mouth daily with breakfast.      HYDROcodone-acetaminophen 5-325 MG per tablet   Commonly known as: NORCO/VICODIN   Take 1 tablet by mouth every 6 (six) hours as needed. For pain      metoprolol tartrate 25 MG tablet   Commonly known as: LOPRESSOR   Take 1 tablet (25 mg total) by mouth 2 (two) times daily.      omeprazole 40 MG capsule   Commonly known as: PRILOSEC   Take 40 mg by mouth daily.      PROAIR HFA 108 (90 BASE) MCG/ACT inhaler   Generic drug: albuterol   Take 2 puffs by mouth Every 6 hours as needed. For shortness of breath      simvastatin 10 MG tablet   Commonly  known as: ZOCOR   Take 1 tablet (10 mg total) by mouth at bedtime.      tiotropium 18 MCG inhalation capsule   Commonly known as: SPIRIVA   Place 18 mcg into inhaler and inhale daily.      warfarin 2 MG tablet   Commonly known as: COUMADIN   Take 1-2 mg by mouth daily. *Take one-half tablet (1mg ) every day in the evening, except take one tablet on Wednesdays only*      zafirlukast 20 MG tablet   Commonly known as: ACCOLATE   Take 20 mg by mouth 2 (two) times daily.          The results of significant diagnostics from this hospitalization (including imaging, microbiology, ancillary and laboratory) are listed below for reference.    Significant Diagnostic Studies: Ct Chest W Contrast  05/12/2012  *RADIOLOGY REPORT*  Clinical Data: Weight loss, cough, evaluate for malignancy  CT CHEST WITH CONTRAST  Technique:  Multidetector CT imaging of the chest was performed following the standard protocol during bolus administration of intravenous contrast.  Contrast: 68mL OMNIPAQUE IOHEXOL 300 MG/ML  SOLN  Comparison: CT  abdomen/pelvis 04/14/2012, chest x-ray 05/11/2012; chest CT 05/29/2010  Findings:  Mediastinum: Unremarkable thoracic inlet. Stable enlarged precarinal lymph node with central calcifications measures 2.2 by 1.8 cm.  A low right paratracheal node measures 10 mm in short axis. Calcified but not enlarged right hilar lymphoid tissue. Unremarkable thoracic esophagus.  Heart/Vascular: Cardiomegaly with prominent left atrial enlargement.  Left subclavian approach cardiac rhythm maintenance device.  Lead terminates in the right ventricular apex.  Main pulmonary artery is markedly enlarged at 3.2 cm in diameter.  The central pulmonary arteries are also enlarged.  There is moderate atherosclerotic vascular calcification of the transverse aorta and coronary arteries.  No pericardial effusion.  Lungs/Pleura: Background mild centrilobular emphysema. Stable small focus of bronchiolectasis and calcification in the right upper lobe suggest residual prior infectious/inflammatory process.  No suspicious pulmonary nodule, pulmonary edema, focal airspace consolidation, pleural effusion or pneumothorax.  There is mild dependent atelectasis in the left lower lobe.  Mild lower lobe bronchial wall thickening.  Upper Abdomen: Incompletely imaged mild extrahepatic biliary ductal dilatation.  The common hepatic duct measures 15 mm in diameter the remainder the upper abdomen is unremarkable.  Bones: No acute fracture or aggressive appearing lytic or blastic osseous lesion.  IMPRESSION:  1.  No findings suspicious for malignancy to explain the patient's underlying clinical symptoms.  2.  Stable enlarged and partially calcified mediastinal lymph nodes dating back to October 2011.  Findings likely represent the sequela of old/indolent granulomatous disease.  Treated lymphoma can have a similar appearance.  3.  Mild centrilobular emphysema and lower lobe bronchial wall thickening.  4.  Marked enlargement of the main and central pulmonary arteries  as can be seen with pulmonary arterial hypertension.  5.  Atherosclerosis including coronary artery disease.   Original Report Authenticated By: Criselda Peaches, M.D.    Ct Abdomen Pelvis W Contrast  04/14/2012  *RADIOLOGY REPORT*  Clinical Data: Diverticulitis with persistent left-sided pain. History diabetes.  Appendectomy.  CT ABDOMEN AND PELVIS WITH CONTRAST  Technique:  Multidetector CT imaging of the abdomen and pelvis was performed following the standard protocol during bolus administration of intravenous contrast.  Contrast: 28mL OMNIPAQUE IOHEXOL 300 MG/ML  SOLN  Comparison: 04/02/2012 and 09/01/2011.  Although back the 2011.  Findings: Mild scarring at the left lung base.  Mild cardiomegaly with a pacer in place. No pericardial  or pleural effusion. Similar mild intrahepatic biliary ductal dilatation. Mildly prominent caudate and lateral segment left liver lobes.  Old granulomatous disease in the spleen.  Mild pancreatic atrophy. Similar common duct dilatation, measuring up to 1.6 cm in the porta hepatis.  03/29/2010, the common and measured 1.4 cm.  Prior cholecystectomy.  No obstructive stone or mass identified.  Normal adrenal glands.  Interpolar right renal lesion measures 7 mm and is too small to entirely characterize.  Most likely a cyst.  Normal left kidney.  Stable small retroperitoneal nodes, not pathologic by size criteria.  There are mildly prominent porta hepatis nodes, including a 1.3 cm node on image 27, likely reactive.  Extensive colonic diverticulosis with sigmoid muscular hypertrophy. No surrounding inflammation or evidence of pericolonic abscess/free perforation. Colonic stool burden suggests constipation.   Small bowel positioned within an area of ventral abdominal pelvic wall laxity.  This is likely concurrent with prior hernia repair site.  No bowel obstruction. No ascites.  Mildly prominent mesenteric nodes are likely reactive and present over prior exams.  No pelvic adenopathy.     Normal urinary bladder with retroverted uterus.  No adnexal mass.  No significant free fluid.  No acute osseous abnormality.  IMPRESSION:  1. Possible constipation.  No other explanation for left-sided pain identified. 2.  Cholecystectomy with biliary ductal dilatation.  Suspect slight progression since 09/01/2011.  If bilirubin is elevated or there is a clinical concern of ampullary lesion or stenosis, consider ERCP. 3. Nonspecific prominence of the lateral segment left liver lobe and caudate lobe, with mildly prominent periportal nodes. Correlate with risk factors for liver disease.   Original Report Authenticated By: Areta Haber, M.D.    Dg Chest Port 1 View  05/11/2012  *RADIOLOGY REPORT*  Clinical Data: Hypotension.  PORTABLE CHEST - 1 VIEW  Comparison: 02/02/2012  Findings: Single view of the chest demonstrates a left single lead cardiac pacemaker.  Heart size is within normal limits.  Lungs are clear without airspace disease or edema.  The trachea is midline. Stable focus of sclerosis in the proximal right humerus could represent an enchondroma or bone infarct.  IMPRESSION: No acute cardiopulmonary disease.   Original Report Authenticated By: Markus Daft, M.D.     Microbiology: Recent Results (from the past 240 hour(s))  URINE CULTURE     Status: Normal   Collection Time   05/11/12  6:41 PM      Component Value Range Status Comment   Specimen Description URINE, CATHETERIZED   Final    Special Requests NONE   Final    Culture  Setup Time 05/12/2012 17:07   Final    Colony Count 65,000 COLONIES/ML   Final    Culture     Final    Value: Multiple bacterial morphotypes present, none predominant. Suggest appropriate recollection if clinically indicated.   Report Status 05/13/2012 FINAL   Final   MRSA PCR SCREENING     Status: Normal   Collection Time   05/11/12  9:49 PM      Component Value Range Status Comment   MRSA by PCR NEGATIVE  NEGATIVE Final   CULTURE, BLOOD (ROUTINE X 2)      Status: Normal (Preliminary result)   Collection Time   05/12/12  9:46 AM      Component Value Range Status Comment   Specimen Description BLOOD RIGHT ANTECUBITAL   Final    Special Requests     Final    Value: BOTTLES DRAWN AEROBIC AND  ANAEROBIC AEB=7CC ANA=5CC   Culture NO GROWTH 1 DAY   Final    Report Status PENDING   Incomplete   CULTURE, BLOOD (ROUTINE X 2)     Status: Normal (Preliminary result)   Collection Time   05/12/12  9:49 AM      Component Value Range Status Comment   Specimen Description BLOOD LEFT ANTECUBITAL   Final    Special Requests     Final    Value: BOTTLES DRAWN AEROBIC AND ANAEROBIC AEB=10CC ANA=7CC   Culture NO GROWTH 1 DAY   Final    Report Status PENDING   Incomplete      Labs: Basic Metabolic Panel:  Lab 0000000 0614 05/12/12 0450 05/11/12 1552  NA 140 144 140  K 4.0 3.7 3.7  CL 108 111 102  CO2 25 23 26   GLUCOSE 98 75 83  BUN 18 28* 33*  CREATININE 0.94 1.20* 1.70*  CALCIUM 9.4 8.6 9.6  MG -- -- --  PHOS -- -- --   Liver Function Tests:  Lab 05/11/12 1552  AST 23  ALT 44*  ALKPHOS 125*  BILITOT 0.4  PROT 7.5  ALBUMIN 4.0     CBC:  Lab 05/13/12 0614 05/12/12 0450 05/11/12 1552  WBC 7.6 6.8 10.1  NEUTROABS -- -- 6.3  HGB 10.5* 10.4* 12.5  HCT 31.8* 31.6* 37.3  MCV 96.1 96.3 96.4  PLT 151 150 193   Cardiac Enzymes:  Lab 05/11/12 1552  CKTOTAL --  CKMB --  CKMBINDEX --  TROPONINI <0.30     CBG:  Lab 05/13/12 0734 05/12/12 2114 05/12/12 1628 05/12/12 1140 05/12/12 0749  GLUCAP 91 126* 152* 114* 85    Time coordinating discharge: *Greater than 30 minutes  Signed:  Garet Hooton C  [facility] 05/13/2012, 8:36 AM

## 2012-05-14 ENCOUNTER — Other Ambulatory Visit: Payer: Self-pay | Admitting: Cardiology

## 2012-05-16 DIAGNOSIS — K5900 Constipation, unspecified: Secondary | ICD-10-CM | POA: Diagnosis not present

## 2012-05-16 DIAGNOSIS — R609 Edema, unspecified: Secondary | ICD-10-CM | POA: Diagnosis not present

## 2012-05-16 DIAGNOSIS — I4891 Unspecified atrial fibrillation: Secondary | ICD-10-CM | POA: Diagnosis not present

## 2012-05-17 DIAGNOSIS — L851 Acquired keratosis [keratoderma] palmaris et plantaris: Secondary | ICD-10-CM | POA: Diagnosis not present

## 2012-05-17 DIAGNOSIS — E1159 Type 2 diabetes mellitus with other circulatory complications: Secondary | ICD-10-CM | POA: Diagnosis not present

## 2012-05-17 LAB — CULTURE, BLOOD (ROUTINE X 2): Culture: NO GROWTH

## 2012-05-18 ENCOUNTER — Encounter (INDEPENDENT_AMBULATORY_CARE_PROVIDER_SITE_OTHER): Payer: Medicaid Other

## 2012-05-18 DIAGNOSIS — I6529 Occlusion and stenosis of unspecified carotid artery: Secondary | ICD-10-CM | POA: Diagnosis not present

## 2012-05-18 DIAGNOSIS — R0989 Other specified symptoms and signs involving the circulatory and respiratory systems: Secondary | ICD-10-CM

## 2012-05-19 NOTE — Progress Notes (Signed)
Reminder in epic to follow up in 2 months with SF in E30 

## 2012-05-20 ENCOUNTER — Telehealth: Payer: Self-pay | Admitting: *Deleted

## 2012-05-20 NOTE — Telephone Encounter (Signed)
[name], if pt had med adjustments made at [facility], then she needs feedback from the provider(s) which made those changes.

## 2012-05-20 NOTE — Telephone Encounter (Signed)
Recently seen at [facility] on 10/2 - states they stopped her Digoxin and the Diltiazem.  Patient questions if she can take her Diltiazem.  Today BP - 120/65  HR - 92.  No c/o cardiac symptoms.  Feels real shakey though.  Has OV scheduled for 10/25.

## 2012-05-21 DIAGNOSIS — IMO0002 Reserved for concepts with insufficient information to code with codable children: Secondary | ICD-10-CM | POA: Diagnosis not present

## 2012-05-21 DIAGNOSIS — K5730 Diverticulosis of large intestine without perforation or abscess without bleeding: Secondary | ICD-10-CM | POA: Diagnosis not present

## 2012-05-21 DIAGNOSIS — Z7901 Long term (current) use of anticoagulants: Secondary | ICD-10-CM | POA: Diagnosis not present

## 2012-05-21 DIAGNOSIS — N281 Cyst of kidney, acquired: Secondary | ICD-10-CM | POA: Diagnosis not present

## 2012-05-21 DIAGNOSIS — I700 Atherosclerosis of aorta: Secondary | ICD-10-CM | POA: Diagnosis not present

## 2012-05-21 DIAGNOSIS — Z950 Presence of cardiac pacemaker: Secondary | ICD-10-CM | POA: Diagnosis not present

## 2012-05-21 DIAGNOSIS — F17200 Nicotine dependence, unspecified, uncomplicated: Secondary | ICD-10-CM | POA: Diagnosis not present

## 2012-05-21 DIAGNOSIS — K5900 Constipation, unspecified: Secondary | ICD-10-CM | POA: Diagnosis not present

## 2012-05-21 DIAGNOSIS — J449 Chronic obstructive pulmonary disease, unspecified: Secondary | ICD-10-CM | POA: Diagnosis not present

## 2012-05-21 DIAGNOSIS — E119 Type 2 diabetes mellitus without complications: Secondary | ICD-10-CM | POA: Diagnosis not present

## 2012-05-21 DIAGNOSIS — Z79899 Other long term (current) drug therapy: Secondary | ICD-10-CM | POA: Diagnosis not present

## 2012-05-21 DIAGNOSIS — R109 Unspecified abdominal pain: Secondary | ICD-10-CM | POA: Diagnosis not present

## 2012-05-23 DIAGNOSIS — N390 Urinary tract infection, site not specified: Secondary | ICD-10-CM | POA: Diagnosis not present

## 2012-05-24 ENCOUNTER — Encounter: Payer: Self-pay | Admitting: Physician Assistant

## 2012-05-24 DIAGNOSIS — I739 Peripheral vascular disease, unspecified: Secondary | ICD-10-CM

## 2012-05-25 NOTE — Telephone Encounter (Signed)
Left message with grand-daughter for patient to return call.  States she is not at home currently.

## 2012-05-26 DIAGNOSIS — N390 Urinary tract infection, site not specified: Secondary | ICD-10-CM | POA: Diagnosis not present

## 2012-05-26 NOTE — Progress Notes (Signed)
REVIEWED.  

## 2012-05-30 NOTE — Telephone Encounter (Signed)
Attempted to reach patient again this morning.  Received message that mail box is not set up.

## 2012-06-03 ENCOUNTER — Ambulatory Visit (INDEPENDENT_AMBULATORY_CARE_PROVIDER_SITE_OTHER): Payer: Medicare Other | Admitting: Physician Assistant

## 2012-06-03 ENCOUNTER — Encounter: Payer: Medicare Other | Admitting: Physician Assistant

## 2012-06-03 ENCOUNTER — Encounter: Payer: Self-pay | Admitting: Physician Assistant

## 2012-06-03 VITALS — BP 102/64 | HR 79 | Ht 63.0 in | Wt 125.0 lb

## 2012-06-03 DIAGNOSIS — I959 Hypotension, unspecified: Secondary | ICD-10-CM

## 2012-06-03 DIAGNOSIS — I4891 Unspecified atrial fibrillation: Secondary | ICD-10-CM

## 2012-06-03 DIAGNOSIS — Z79899 Other long term (current) drug therapy: Secondary | ICD-10-CM

## 2012-06-03 DIAGNOSIS — Z950 Presence of cardiac pacemaker: Secondary | ICD-10-CM | POA: Diagnosis not present

## 2012-06-03 DIAGNOSIS — I779 Disorder of arteries and arterioles, unspecified: Secondary | ICD-10-CM

## 2012-06-03 MED ORDER — DILTIAZEM HCL ER COATED BEADS 120 MG PO CP24: 120.0000 mg | ORAL_CAPSULE | Freq: Every day | ORAL | Status: DC

## 2012-06-03 NOTE — Assessment & Plan Note (Addendum)
Will resume calcium channel blocker with Cardizem CD, but at one half previous dose with 120 mg daily. Patient will need close monitoring of BP, which will be done by her granddaughter, who is a CMA. She will contact our office in one week with follow up vitals. Patient remains on chronic Coumadin anticoagulation, followed by primary M.D.

## 2012-06-03 NOTE — Assessment & Plan Note (Signed)
Nonobstructive bilateral ICA disease, by recent followup carotid duplex imaging.

## 2012-06-03 NOTE — Assessment & Plan Note (Signed)
Stable, following recent hospitalization with recorded systolics in the 70 range. Lasix has since been resumed, albeit at one half dose. We'll order followup BMET for close monitoring of electrolytes and renal function. Of note, patient does have history of renal sufficiency.

## 2012-06-03 NOTE — Patient Instructions (Addendum)
   Lab - BMET - do today  Office will contact with result   Begin Cardizem CD 120mg  daily Continue all other current medications.  Check blood pressure and heart rate in 7-10 days.  Please call readings to office. Follow up in  4-6 weeks

## 2012-06-03 NOTE — Assessment & Plan Note (Signed)
Follow up with Dr. Rayann Heman, as previously scheduled.

## 2012-06-03 NOTE — Progress Notes (Addendum)
Primary Cardiologist: Jacqulyn Cane, MD   HPI: Post hospital followup from [facility], referred to our team in consultation for assistance in management of hypotension. There was no definite primary cardiac issue noted, including no definite CHF. Lasix temporarily placed on hold, and patient responded to IV hydration therapy. Recommendation was to resume beta blocker at low dose, and uptitrate accordingly.   Since her recent hospitalization, patient has remained clinically stable and with improved BP readings. In fact, Lasix was resumed over 2 weeks ago, but at one half the previous dose, by her primary M.D. Followup labs have not yet been drawn. Her only complaint is of occasional elevated pulse readings in the upper 90s, but with no significant associated symptoms.   12-lead EKG today, reviewed by me, indicates atypical atrial flutter versus atrial fibrillation at approximately 80 bpm  Allergies  Allergen Reactions  . Torsemide   . Tramadol Nausea Only    Current Outpatient Prescriptions  Medication Sig Dispense Refill  . clonazePAM (KLONOPIN) 0.5 MG tablet Take 0.5 mg by mouth 3 (three) times daily as needed.      . Fluticasone-Salmeterol (ADVAIR DISKUS) 250-50 MCG/DOSE AEPB Inhale 1 puff into the lungs every 12 (twelve) hours.        . furosemide (LASIX) 40 MG tablet Take 20 mg by mouth daily.       [name] glyBURIDE (DIABETA) 5 MG tablet Take 5 mg by mouth daily with breakfast.       . HYDROcodone-acetaminophen (NORCO) 5-325 MG per tablet Take 1 tablet by mouth every 6 (six) hours as needed. For pain       . metoprolol tartrate (LOPRESSOR) 25 MG tablet Take 1 tablet (25 mg total) by mouth 2 (two) times daily.  60 tablet  6  . omeprazole (PRILOSEC) 40 MG capsule Take 40 mg by mouth daily.        [name] PROAIR HFA 108 (90 BASE) MCG/ACT inhaler Take 2 puffs by mouth Every 6 hours as needed. For shortness of breath      . simvastatin (ZOCOR) 10 MG tablet Take 1 tablet (10 mg total) by mouth at bedtime.  30  tablet  6  . tiotropium (SPIRIVA) 18 MCG inhalation capsule Place 18 mcg into inhaler and inhale daily.        [name] warfarin (COUMADIN) 2 MG tablet Take 1-2 mg by mouth daily. *Take one-half tablet (1mg ) every day in the evening, except take one tablet on Wednesdays only*      . zafirlukast (ACCOLATE) 20 MG tablet Take 20 mg by mouth 2 (two) times daily.          Past Medical History  Diagnosis Date  . Permanent atrial fibrillation     H/o tachybradycardia syndrome on Coumadin anticoagulation, has pacemaker.  [name] CAD (coronary artery disease) 2011    Catheterization August 2011: mild nonobstructive coronary artery disease complicated by large left rectus sheath hematoma status post evacuation Coumadin restarted without recurrence  . Diabetes mellitus   . Chronic airway obstruction, not elsewhere classified   . Anemia, unspecified   . Chronic kidney disease, stage II (mild)   . Unspecified hypertensive kidney disease with chronic kidney disease stage I through stage IV, or unspecified(403.90)   . Tachycardia-bradycardia     s/p St[employer] Jude single chamber PPM 10/2010   . GERD (gastroesophageal reflux disease)   . Abnormal CT of the chest     Mediastinal and hilar adenopathy followed by Dr. Koleen Nimrod in [place]  . Congestive heart failure,  unspecified     EF now 60%, previous nonischemic cardiomyopathy  . Pacemaker     Single chamber [place]. Jude ACCENT [phone] SN [id number] 04/[birth year]/1959 10/31/2010  . Valvular heart disease     a. H/o moderate mitral stenosis by cath 2011 but no mention of this on 10/2011 echo. b. 10/2011 echo: mod MR, mild AS, mild TR.  [name] Hypercholesterolemia   . Diverticulitis Dec 2012  . Body mass index (BMI) of 20.0-20.9 in adult FEB 2013 147 LBS  . Recurrent UTI   . Neurogenic sleep apnea     Uses noctural oxygen prescribed by her pulmonologi  . Rheumatic heart disease   . Gastritis     By EGD  . Carotid artery disease     Doppler, 05/2012, 0-39% bilateral    Past Surgical History    Procedure Date  . Single-chamber pacemaker implantation 3/12    by Dr Caryl Comes  . Evacuation of retroperitoneal and left rectus sheath   . Cholecystectomy     40+ years ago  . Hernia repair     X3  . Pilonidal cyst / sinus excision   . Esophagogastroduodenoscopy 10/2011    Fields-erosive gastritis, biopsy with no H. pylori, hiatal hernia, peptic duodenitis, no celiac disease, duodenal diverticulum, duodenal nodule  . Colonoscopy 10/2011    Dyann Ruddle sigmoid to descending diverticulosis, internal hemorrhoids  . Eus 11/16/11    Mishra-13 MM CBD, normal pancreatic duct, otherwise normal EUS    History   Social History  . Marital Status: Single    Spouse Name: N/A    Number of Children: 2  . Years of Education: N/A   Occupational History  . RETIRED    Social History Main Topics  . Smoking status: Former Smoker -- 2.0 packs/day for 45 years    Types: Cigarettes    Quit date: 08/10/2006  . Smokeless tobacco: Never Used  . Alcohol Use: No  . Drug Use: No  . Sexually Active: Not on file   Other Topics Concern  . Not on file   Social History Narrative  . No narrative on file   Social History Narrative  . No narrative on file    Problem Relation Age of Onset  . Cancer Mother     Uterus  . Cancer Daughter     kidney cancer, in remission  . Heart disease Father   . Colon cancer Neg Hx     ROS: no nausea, vomiting; no fever, chills; no melena, hematochezia; no claudication  PHYSICAL EXAM: BP 102/64  Pulse 79  Ht 5\' 3"  (1.6 m)  Wt 125 lb (56.7 kg)  BMI 22.14 kg/m2 GENERAL: 77 year old female, quite frail appearing ; NAD HEENT: NCAT, PERRLA, EOMI; sclera clear; no xanthelasma NECK: palpable bilateral carotid pulses, no bruits; no JVD; no TM LUNGS: CTA bilaterally CARDIAC:  regular regular (S1, S2); no significant murmurs; no rubs or gallops ABDOMEN: soft, non-tender; intact BS EXTREMETIES: intact distal pulses; no significant peripheral edema SKIN: warm/dry; no  obvious rash/lesions MUSCULOSKELETAL: no joint deformity NEURO: no focal deficit; NL affect   EKG: reviewed and available in Electronic Records   ASSESSMENT & PLAN:  Hypotension Stable, following recent hospitalization with recorded systolics in the 70 range. Lasix has since been resumed, albeit at one half dose. We'll order followup BMET for close monitoring of electrolytes and renal function. Of note, patient does have history of renal insufficiency.  Atrial fibrillation Will resume calcium channel blocker with Cardizem CD for better rate control, but  at one half previous dose with 120 mg daily. Patient will need close monitoring of BP, which will be done by her granddaughter, who is a CMA. She will contact our office in one week with follow up vitals. Patient remains on chronic Coumadin anticoagulation, followed by primary M.D.  Pacemaker Follow up with Dr. Rayann Heman, as previously scheduled.  Carotid artery disease Nonobstructive bilateral ICA disease, by recent followup carotid duplex imaging.    Gene Ramsey Midgett, PAC

## 2012-06-03 NOTE — Telephone Encounter (Signed)
Patient in office today for visit with Gene Serpe, PA.

## 2012-06-07 ENCOUNTER — Telehealth: Payer: Self-pay | Admitting: *Deleted

## 2012-06-07 NOTE — Telephone Encounter (Signed)
Patient informed. 

## 2012-06-07 NOTE — Telephone Encounter (Signed)
Message copied by Merlene Laughter on Tue Jun 07, 2012  1:31 PM ------      Message from: Donney Dice      Created: Tue Jun 07, 2012 12:45 PM       Labs ok

## 2012-06-09 ENCOUNTER — Other Ambulatory Visit: Payer: Self-pay

## 2012-06-09 MED ORDER — METOPROLOL TARTRATE 25 MG PO TABS: 25.0000 mg | ORAL_TABLET | Freq: Two times a day (BID) | ORAL | Status: DC

## 2012-06-09 NOTE — Telephone Encounter (Signed)
..   Requested Prescriptions   Pending Prescriptions Disp Refills  . metoprolol tartrate (LOPRESSOR) 25 MG tablet 60 tablet 6    Sig: Take 1 tablet (25 mg total) by mouth 2 (two) times daily.

## 2012-06-13 ENCOUNTER — Encounter: Payer: Medicare Other | Admitting: *Deleted

## 2012-06-14 ENCOUNTER — Encounter: Payer: Self-pay | Admitting: *Deleted

## 2012-06-20 DIAGNOSIS — I4891 Unspecified atrial fibrillation: Secondary | ICD-10-CM | POA: Diagnosis not present

## 2012-06-22 ENCOUNTER — Ambulatory Visit (INDEPENDENT_AMBULATORY_CARE_PROVIDER_SITE_OTHER): Payer: Medicare Other | Admitting: *Deleted

## 2012-06-22 ENCOUNTER — Encounter: Payer: Self-pay | Admitting: Internal Medicine

## 2012-06-22 DIAGNOSIS — I495 Sick sinus syndrome: Secondary | ICD-10-CM

## 2012-06-22 DIAGNOSIS — Z950 Presence of cardiac pacemaker: Secondary | ICD-10-CM | POA: Diagnosis not present

## 2012-06-23 ENCOUNTER — Encounter: Payer: Self-pay | Admitting: *Deleted

## 2012-06-23 LAB — REMOTE PACEMAKER DEVICE
BMOD-0002RV: 8
BRDY-0005RV: 50 {beats}/min
DEVICE MODEL PM: 7199163

## 2012-07-01 DIAGNOSIS — I4891 Unspecified atrial fibrillation: Secondary | ICD-10-CM | POA: Diagnosis not present

## 2012-07-04 ENCOUNTER — Ambulatory Visit: Payer: Medicare Other | Admitting: Physician Assistant

## 2012-07-05 ENCOUNTER — Encounter: Payer: Self-pay | Admitting: *Deleted

## 2012-07-17 ENCOUNTER — Other Ambulatory Visit: Payer: Self-pay | Admitting: Cardiology

## 2012-07-21 ENCOUNTER — Ambulatory Visit (INDEPENDENT_AMBULATORY_CARE_PROVIDER_SITE_OTHER): Payer: Medicare Other | Admitting: Physician Assistant

## 2012-07-21 ENCOUNTER — Encounter: Payer: Self-pay | Admitting: Physician Assistant

## 2012-07-21 VITALS — BP 100/64 | HR 79 | Ht 63.0 in | Wt 129.4 lb

## 2012-07-21 DIAGNOSIS — Z950 Presence of cardiac pacemaker: Secondary | ICD-10-CM

## 2012-07-21 DIAGNOSIS — I959 Hypotension, unspecified: Secondary | ICD-10-CM

## 2012-07-21 DIAGNOSIS — I4891 Unspecified atrial fibrillation: Secondary | ICD-10-CM

## 2012-07-21 DIAGNOSIS — R609 Edema, unspecified: Secondary | ICD-10-CM

## 2012-07-21 NOTE — Patient Instructions (Addendum)

## 2012-07-21 NOTE — Assessment & Plan Note (Signed)
Essentially unchanged since last OV; patient clinically asymptomatic with these readings. I instructed her granddaughter to continue monitoring the patient closely and, if symptomatic, to check her vitals at that time. I would prefer to continue treatment with beta blocker therapy for her AF, rather than calcium channel blocker, if she were to develop worsening hypotension.

## 2012-07-21 NOTE — Progress Notes (Signed)
Primary Cardiologist: [name] Cane, MD   HPI: Patient presents for early scheduled followup.  When last seen, I resumed calcium channel blocker therapy for rate control, but at one half the previous dose in light of her significant hypotension. She presents today with essentially unchanged BP. Clinically, she denies any near-syncope/syncope, and is followed closely by her granddaughter, a CMA, with whom she lives. She reports that [name]'s BP readings are usually in the [id number] systolic range.   The patient has also taken prn Lopressor in the past, for tachycardia palpitations. Her granddaughter states that the patient has only had to do this on 1-2 occasions, since last OV.  Allergies  Allergen Reactions  . Torsemide   . Tramadol Nausea Only    Current Outpatient Prescriptions  Medication Sig Dispense Refill  . clonazePAM (KLONOPIN) 0.5 MG tablet Take 0.5 mg by mouth 3 (three) times daily as needed.      . diltiazem (CARDIZEM CD) 120 MG 24 hr capsule Take 1 capsule (120 mg total) by mouth daily.  30 capsule  6  . Fluticasone-Salmeterol (ADVAIR DISKUS) 250-50 MCG/DOSE AEPB Inhale 1 puff into the lungs every 12 (twelve) hours.        . furosemide (LASIX) 40 MG tablet Take 20 mg by mouth daily.       [name] glyBURIDE (DIABETA) 5 MG tablet Take 5 mg by mouth daily with breakfast.       . HYDROcodone-acetaminophen (NORCO) 5-325 MG per tablet Take 1 tablet by mouth every 6 (six) hours as needed. For pain       . metoprolol tartrate (LOPRESSOR) 25 MG tablet Take 1 tablet (25 mg total) by mouth 2 (two) times daily.  60 tablet  6  . omeprazole (PRILOSEC) 40 MG capsule Take 40 mg by mouth daily.        [name] PROAIR HFA 108 (90 BASE) MCG/ACT inhaler Take 2 puffs by mouth Every 6 hours as needed. For shortness of breath      . simvastatin (ZOCOR) 10 MG tablet Take 1 tablet (10 mg total) by mouth at bedtime.  30 tablet  6  . tiotropium (SPIRIVA) 18 MCG inhalation capsule Place 18 mcg into inhaler and  inhale daily.        [name] warfarin (COUMADIN) 2 MG tablet Take 1-2 mg by mouth daily. *Take one-half tablet (1mg ) every day in the evening, except take one tablet on Wednesdays only*      . zafirlukast (ACCOLATE) 20 MG tablet Take 20 mg by mouth 2 (two) times daily.          Past Medical History  Diagnosis Date  . Permanent atrial fibrillation     H/o tachybradycardia syndrome on Coumadin anticoagulation, has pacemaker.  [name] CAD (coronary artery disease) 2011    Catheterization August 2011: mild nonobstructive coronary artery disease complicated by large left rectus sheath hematoma status post evacuation Coumadin restarted without recurrence  . Diabetes mellitus   . Chronic airway obstruction, not elsewhere classified   . Anemia, unspecified   . Chronic kidney disease, stage II (mild)   . Unspecified hypertensive kidney disease with chronic kidney disease stage I through stage IV, or unspecified(403.90)   . Tachycardia-bradycardia     s/p St. [name] single chamber PPM 10/2010   . GERD (gastroesophageal reflux disease)   . Abnormal CT of the chest     Mediastinal and hilar adenopathy followed by Dr. Koleen [name] in [place]  . Congestive heart failure, unspecified  EF now 60%, previous nonischemic cardiomyopathy  . Pacemaker     Single chamber [place]. [name] [phone] SN 719 04/11/1959 10/31/2010  . Valvular heart disease     a. H/o moderate mitral stenosis by cath 2011 but no mention of this on 10/2011 echo. b. 10/2011 echo: mod MR, mild AS, mild TR; EF>65%  . Hypercholesterolemia   . Diverticulitis Dec 2012  . Body mass index (BMI) of 20.0-20.9 in adult FEB 2013 147 LBS  . Recurrent UTI   . Neurogenic sleep apnea     Uses noctural oxygen prescribed by her pulmonologi  . Rheumatic heart disease   . Gastritis     By EGD  . Carotid artery disease     Doppler, 05/2012, 0-39% bilateral    Past Surgical History  Procedure Date  . Single-chamber pacemaker implantation 3/12    by Dr [name]  .  Evacuation of retroperitoneal and left rectus sheath   . Cholecystectomy     40+ years ago  . Hernia repair     X3  . Pilonidal cyst / sinus excision   . Esophagogastroduodenoscopy 10/2011    Fields-erosive gastritis, biopsy with no H. pylori, hiatal hernia, peptic duodenitis, no celiac disease, duodenal diverticulum, duodenal nodule  . Colonoscopy 10/2011    [name] sigmoid to descending diverticulosis, internal hemorrhoids  . Eus 11/16/11    Mishra-13 MM CBD, normal pancreatic duct, otherwise normal EUS    History   Social History  . Marital Status: Single    Spouse Name: [name]    Number of Children: 2  . Years of Education: [name]   Occupational History  . RETIRED    Social History Main Topics  . Smoking status: Former Smoker -- 2.0 packs/day for 45 years    Types: Cigarettes    Quit date: 08/10/2006  . Smokeless tobacco: Never Used  . Alcohol Use: No  . Drug Use: No  . Sexually Active: Not on file   Other Topics Concern  . Not on file   Social History Narrative  . No narrative on file   Social History Narrative  . No narrative on file    Problem Relation Age of Onset  . Cancer Mother     Uterus  . Cancer Daughter     kidney cancer, in remission  . Heart disease Father   . Colon cancer Neg Hx     ROS: no nausea, vomiting; no fever, chills; no melena, hematochezia; no claudication  PHYSICAL EXAM: BP 100/64  Pulse 79  Ht 5\' 3"  (1.6 m)  Wt 129 lb 6.4 oz (58.695 kg)  BMI 22.92 kg/m2  SpO2 95% GENERAL: 77 year old female, quite frail appearing ; NAD  HEENT: NCAT, PERRLA, EOMI; sclera clear; no xanthelasma; edentulous  NECK: palpable bilateral carotid pulses, no bruits; no JVD; no TM  LUNGS: CTA bilaterally  CARDIAC: Irregularly irregular (S1, S2); no significant murmurs; no rubs or gallops  ABDOMEN: soft, non-tender; intact BS  EXTREMETIES: Trace significant peripheral edema  SKIN: warm/dry; no obvious rash/lesions; dusky  MUSCULOSKELETAL: no joint  deformity  NEURO: no focal deficit; NL affect    EKG:    ASSESSMENT & PLAN:  Atrial fibrillation Adequately rate controlled on combination therapy with calcium channel and beta blockers. On chronic Coumadin, followed by primary M.D.  Pacemaker-St.[name] Followed by Dr. Rayann Heman  Hypotension Essentially unchanged since last OV; patient clinically asymptomatic with these readings. I instructed her granddaughter to continue monitoring the patient closely and, if symptomatic, to check her  vitals at that time. I would prefer to continue treatment with beta blocker therapy for her AF, rather than calcium channel blocker, if she were to develop worsening hypotension.  Peripheral edema Stable on current her reticulocyte regimen, followed by primary M.D.    Gene Dietrich Ke, PAC

## 2012-07-21 NOTE — Assessment & Plan Note (Signed)
Adequately rate controlled on combination therapy with calcium channel and beta blockers. On chronic Coumadin, followed by primary M.D.

## 2012-07-21 NOTE — Assessment & Plan Note (Signed)
Stable on current her reticulocyte regimen, followed by primary M.D.

## 2012-07-21 NOTE — Assessment & Plan Note (Signed)
Followed by Dr. Rayann Heman

## 2012-07-22 NOTE — Telephone Encounter (Signed)
Not sure if this patient is taken digoxin will route this to Evette Cristal for further investigation. I called several times and  left messages  for her to call me back I don't see this med on her med list. Pharmacy states it was last filled on the 06/16/12

## 2012-08-08 DIAGNOSIS — I10 Essential (primary) hypertension: Secondary | ICD-10-CM | POA: Diagnosis not present

## 2012-08-08 DIAGNOSIS — I4891 Unspecified atrial fibrillation: Secondary | ICD-10-CM | POA: Diagnosis not present

## 2012-08-09 LAB — BASIC METABOLIC PANEL
Creatinine: 1.2 mg/dL — AB (ref <=1.1)
Potassium: 4.2 mmol/L (ref 3.4–5.3)

## 2012-09-07 ENCOUNTER — Encounter: Payer: Self-pay | Admitting: Cardiovascular Disease

## 2012-09-07 DIAGNOSIS — K219 Gastro-esophageal reflux disease without esophagitis: Secondary | ICD-10-CM | POA: Diagnosis not present

## 2012-09-07 DIAGNOSIS — I4891 Unspecified atrial fibrillation: Secondary | ICD-10-CM | POA: Diagnosis present

## 2012-09-07 DIAGNOSIS — F17200 Nicotine dependence, unspecified, uncomplicated: Secondary | ICD-10-CM | POA: Diagnosis present

## 2012-09-07 DIAGNOSIS — J441 Chronic obstructive pulmonary disease with (acute) exacerbation: Secondary | ICD-10-CM | POA: Diagnosis not present

## 2012-09-07 DIAGNOSIS — E119 Type 2 diabetes mellitus without complications: Secondary | ICD-10-CM | POA: Diagnosis not present

## 2012-09-07 DIAGNOSIS — D62 Acute posthemorrhagic anemia: Secondary | ICD-10-CM | POA: Diagnosis not present

## 2012-09-07 DIAGNOSIS — IMO0002 Reserved for concepts with insufficient information to code with codable children: Secondary | ICD-10-CM | POA: Diagnosis not present

## 2012-09-07 DIAGNOSIS — Z79899 Other long term (current) drug therapy: Secondary | ICD-10-CM | POA: Diagnosis not present

## 2012-09-07 DIAGNOSIS — Z888 Allergy status to other drugs, medicaments and biological substances status: Secondary | ICD-10-CM | POA: Diagnosis not present

## 2012-09-07 DIAGNOSIS — Z886 Allergy status to analgesic agent status: Secondary | ICD-10-CM | POA: Diagnosis not present

## 2012-09-07 DIAGNOSIS — J189 Pneumonia, unspecified organism: Secondary | ICD-10-CM | POA: Diagnosis not present

## 2012-09-07 DIAGNOSIS — J449 Chronic obstructive pulmonary disease, unspecified: Secondary | ICD-10-CM | POA: Diagnosis not present

## 2012-09-07 DIAGNOSIS — J90 Pleural effusion, not elsewhere classified: Secondary | ICD-10-CM | POA: Diagnosis not present

## 2012-09-07 DIAGNOSIS — Z7901 Long term (current) use of anticoagulants: Secondary | ICD-10-CM | POA: Diagnosis not present

## 2012-09-07 DIAGNOSIS — E785 Hyperlipidemia, unspecified: Secondary | ICD-10-CM | POA: Diagnosis present

## 2012-09-07 DIAGNOSIS — I052 Rheumatic mitral stenosis with insufficiency: Secondary | ICD-10-CM | POA: Diagnosis present

## 2012-09-07 DIAGNOSIS — I428 Other cardiomyopathies: Secondary | ICD-10-CM | POA: Diagnosis present

## 2012-09-07 DIAGNOSIS — Z950 Presence of cardiac pacemaker: Secondary | ICD-10-CM | POA: Diagnosis not present

## 2012-09-08 ENCOUNTER — Encounter: Payer: Self-pay | Admitting: Cardiovascular Disease

## 2012-09-08 DIAGNOSIS — J441 Chronic obstructive pulmonary disease with (acute) exacerbation: Secondary | ICD-10-CM | POA: Diagnosis not present

## 2012-09-08 DIAGNOSIS — J90 Pleural effusion, not elsewhere classified: Secondary | ICD-10-CM | POA: Diagnosis not present

## 2012-09-08 DIAGNOSIS — J189 Pneumonia, unspecified organism: Secondary | ICD-10-CM | POA: Diagnosis not present

## 2012-09-13 DIAGNOSIS — I2510 Atherosclerotic heart disease of native coronary artery without angina pectoris: Secondary | ICD-10-CM | POA: Diagnosis not present

## 2012-09-13 DIAGNOSIS — Z9981 Dependence on supplemental oxygen: Secondary | ICD-10-CM | POA: Diagnosis not present

## 2012-09-13 DIAGNOSIS — J441 Chronic obstructive pulmonary disease with (acute) exacerbation: Secondary | ICD-10-CM | POA: Diagnosis not present

## 2012-09-13 DIAGNOSIS — E119 Type 2 diabetes mellitus without complications: Secondary | ICD-10-CM | POA: Diagnosis not present

## 2012-09-13 DIAGNOSIS — I4891 Unspecified atrial fibrillation: Secondary | ICD-10-CM | POA: Diagnosis not present

## 2012-09-13 DIAGNOSIS — J189 Pneumonia, unspecified organism: Secondary | ICD-10-CM | POA: Diagnosis not present

## 2012-09-15 DIAGNOSIS — I2510 Atherosclerotic heart disease of native coronary artery without angina pectoris: Secondary | ICD-10-CM | POA: Diagnosis not present

## 2012-09-15 DIAGNOSIS — Z9981 Dependence on supplemental oxygen: Secondary | ICD-10-CM | POA: Diagnosis not present

## 2012-09-15 DIAGNOSIS — E119 Type 2 diabetes mellitus without complications: Secondary | ICD-10-CM | POA: Diagnosis not present

## 2012-09-15 DIAGNOSIS — I4891 Unspecified atrial fibrillation: Secondary | ICD-10-CM | POA: Diagnosis not present

## 2012-09-15 DIAGNOSIS — J441 Chronic obstructive pulmonary disease with (acute) exacerbation: Secondary | ICD-10-CM | POA: Diagnosis not present

## 2012-09-15 DIAGNOSIS — J189 Pneumonia, unspecified organism: Secondary | ICD-10-CM | POA: Diagnosis not present

## 2012-09-16 DIAGNOSIS — E119 Type 2 diabetes mellitus without complications: Secondary | ICD-10-CM | POA: Diagnosis not present

## 2012-09-16 DIAGNOSIS — I2510 Atherosclerotic heart disease of native coronary artery without angina pectoris: Secondary | ICD-10-CM | POA: Diagnosis not present

## 2012-09-16 DIAGNOSIS — J189 Pneumonia, unspecified organism: Secondary | ICD-10-CM | POA: Diagnosis not present

## 2012-09-16 DIAGNOSIS — I4891 Unspecified atrial fibrillation: Secondary | ICD-10-CM | POA: Diagnosis not present

## 2012-09-16 DIAGNOSIS — J441 Chronic obstructive pulmonary disease with (acute) exacerbation: Secondary | ICD-10-CM | POA: Diagnosis not present

## 2012-09-16 DIAGNOSIS — Z9981 Dependence on supplemental oxygen: Secondary | ICD-10-CM | POA: Diagnosis not present

## 2012-09-19 DIAGNOSIS — I4891 Unspecified atrial fibrillation: Secondary | ICD-10-CM | POA: Diagnosis not present

## 2012-09-20 DIAGNOSIS — I2510 Atherosclerotic heart disease of native coronary artery without angina pectoris: Secondary | ICD-10-CM | POA: Diagnosis not present

## 2012-09-20 DIAGNOSIS — J441 Chronic obstructive pulmonary disease with (acute) exacerbation: Secondary | ICD-10-CM | POA: Diagnosis not present

## 2012-09-20 DIAGNOSIS — E119 Type 2 diabetes mellitus without complications: Secondary | ICD-10-CM | POA: Diagnosis not present

## 2012-09-20 DIAGNOSIS — J189 Pneumonia, unspecified organism: Secondary | ICD-10-CM | POA: Diagnosis not present

## 2012-09-20 DIAGNOSIS — I4891 Unspecified atrial fibrillation: Secondary | ICD-10-CM | POA: Diagnosis not present

## 2012-09-20 DIAGNOSIS — Z9981 Dependence on supplemental oxygen: Secondary | ICD-10-CM | POA: Diagnosis not present

## 2012-09-23 DIAGNOSIS — J189 Pneumonia, unspecified organism: Secondary | ICD-10-CM | POA: Diagnosis not present

## 2012-09-23 DIAGNOSIS — I4891 Unspecified atrial fibrillation: Secondary | ICD-10-CM | POA: Diagnosis not present

## 2012-09-23 DIAGNOSIS — J441 Chronic obstructive pulmonary disease with (acute) exacerbation: Secondary | ICD-10-CM | POA: Diagnosis not present

## 2012-09-23 DIAGNOSIS — I2510 Atherosclerotic heart disease of native coronary artery without angina pectoris: Secondary | ICD-10-CM | POA: Diagnosis not present

## 2012-09-23 DIAGNOSIS — E119 Type 2 diabetes mellitus without complications: Secondary | ICD-10-CM | POA: Diagnosis not present

## 2012-09-23 DIAGNOSIS — Z9981 Dependence on supplemental oxygen: Secondary | ICD-10-CM | POA: Diagnosis not present

## 2012-09-26 ENCOUNTER — Encounter: Payer: Medicare Other | Admitting: *Deleted

## 2012-09-28 ENCOUNTER — Other Ambulatory Visit: Payer: Self-pay | Admitting: Internal Medicine

## 2012-09-28 ENCOUNTER — Encounter: Payer: Medicare Other | Admitting: *Deleted

## 2012-09-28 ENCOUNTER — Encounter: Payer: Self-pay | Admitting: *Deleted

## 2012-09-28 DIAGNOSIS — E119 Type 2 diabetes mellitus without complications: Secondary | ICD-10-CM | POA: Diagnosis not present

## 2012-09-28 DIAGNOSIS — I2510 Atherosclerotic heart disease of native coronary artery without angina pectoris: Secondary | ICD-10-CM | POA: Diagnosis not present

## 2012-09-28 DIAGNOSIS — Z9981 Dependence on supplemental oxygen: Secondary | ICD-10-CM | POA: Diagnosis not present

## 2012-09-28 DIAGNOSIS — J441 Chronic obstructive pulmonary disease with (acute) exacerbation: Secondary | ICD-10-CM | POA: Diagnosis not present

## 2012-09-28 DIAGNOSIS — J189 Pneumonia, unspecified organism: Secondary | ICD-10-CM | POA: Diagnosis not present

## 2012-09-28 DIAGNOSIS — I4891 Unspecified atrial fibrillation: Secondary | ICD-10-CM | POA: Diagnosis not present

## 2012-09-28 LAB — POCT INR: INR: 2.2 — AB (ref <=1.1)

## 2012-10-07 DIAGNOSIS — J441 Chronic obstructive pulmonary disease with (acute) exacerbation: Secondary | ICD-10-CM | POA: Diagnosis not present

## 2012-10-07 DIAGNOSIS — J189 Pneumonia, unspecified organism: Secondary | ICD-10-CM | POA: Diagnosis not present

## 2012-10-07 LAB — REMOTE PACEMAKER DEVICE
BMOD-0002RV: 8
BRDY-0002RV: 60 {beats}/min
BRDY-0004RV: 120 {beats}/min
RV LEAD AMPLITUDE: 12 mv
RV LEAD IMPEDENCE PM: 760 Ohm

## 2012-10-15 ENCOUNTER — Encounter: Payer: Self-pay | Admitting: *Deleted

## 2012-10-15 DIAGNOSIS — E559 Vitamin D deficiency, unspecified: Secondary | ICD-10-CM | POA: Insufficient documentation

## 2012-10-17 DIAGNOSIS — Z9981 Dependence on supplemental oxygen: Secondary | ICD-10-CM | POA: Diagnosis not present

## 2012-10-17 DIAGNOSIS — E119 Type 2 diabetes mellitus without complications: Secondary | ICD-10-CM | POA: Diagnosis not present

## 2012-10-17 DIAGNOSIS — J189 Pneumonia, unspecified organism: Secondary | ICD-10-CM | POA: Diagnosis not present

## 2012-10-17 DIAGNOSIS — I4891 Unspecified atrial fibrillation: Secondary | ICD-10-CM | POA: Diagnosis not present

## 2012-10-17 DIAGNOSIS — J441 Chronic obstructive pulmonary disease with (acute) exacerbation: Secondary | ICD-10-CM | POA: Diagnosis not present

## 2012-10-17 DIAGNOSIS — I2510 Atherosclerotic heart disease of native coronary artery without angina pectoris: Secondary | ICD-10-CM | POA: Diagnosis not present

## 2012-10-18 ENCOUNTER — Encounter: Payer: Self-pay | Admitting: *Deleted

## 2012-10-20 ENCOUNTER — Encounter: Payer: Self-pay | Admitting: Internal Medicine

## 2012-10-31 ENCOUNTER — Ambulatory Visit (INDEPENDENT_AMBULATORY_CARE_PROVIDER_SITE_OTHER): Payer: Medicare Other | Admitting: Nurse Practitioner

## 2012-10-31 ENCOUNTER — Telehealth: Payer: Self-pay | Admitting: Nurse Practitioner

## 2012-10-31 DIAGNOSIS — F411 Generalized anxiety disorder: Secondary | ICD-10-CM

## 2012-10-31 MED ORDER — CLONAZEPAM 0.5 MG PO TABS: 0.5000 mg | ORAL_TABLET | Freq: Three times a day (TID) | ORAL | Status: DC | PRN

## 2012-10-31 NOTE — Telephone Encounter (Signed)
PLEASE ADVISE.

## 2012-10-31 NOTE — Telephone Encounter (Signed)
Patient needs a refill of her Clonazepan 2.5mg . She would like to pick it up tomorrow.

## 2012-10-31 NOTE — Progress Notes (Signed)
  Subjective:    Patient ID: [name], female    DOB: 1935-03-10, 78 y.o.   MRN: [id number]  HPI    Review of Systems     Objective:   Physical Exam        Assessment & Plan:   Pateint not seen orders only

## 2012-11-01 ENCOUNTER — Ambulatory Visit: Payer: Medicare Other

## 2012-11-01 ENCOUNTER — Ambulatory Visit (INDEPENDENT_AMBULATORY_CARE_PROVIDER_SITE_OTHER): Payer: Medicare Other | Admitting: Pharmacist Clinician (PhC)/ Clinical Pharmacy Specialist

## 2012-11-01 DIAGNOSIS — R197 Diarrhea, unspecified: Secondary | ICD-10-CM

## 2012-11-01 DIAGNOSIS — I4891 Unspecified atrial fibrillation: Secondary | ICD-10-CM

## 2012-11-01 LAB — POCT INR: INR: 2.1

## 2012-11-09 DIAGNOSIS — I4891 Unspecified atrial fibrillation: Secondary | ICD-10-CM | POA: Diagnosis not present

## 2012-11-09 DIAGNOSIS — I2510 Atherosclerotic heart disease of native coronary artery without angina pectoris: Secondary | ICD-10-CM | POA: Diagnosis not present

## 2012-11-09 DIAGNOSIS — E119 Type 2 diabetes mellitus without complications: Secondary | ICD-10-CM | POA: Diagnosis not present

## 2012-11-09 DIAGNOSIS — J441 Chronic obstructive pulmonary disease with (acute) exacerbation: Secondary | ICD-10-CM | POA: Diagnosis not present

## 2012-11-09 DIAGNOSIS — J189 Pneumonia, unspecified organism: Secondary | ICD-10-CM | POA: Diagnosis not present

## 2012-11-09 DIAGNOSIS — Z9981 Dependence on supplemental oxygen: Secondary | ICD-10-CM | POA: Diagnosis not present

## 2012-11-24 ENCOUNTER — Other Ambulatory Visit: Payer: Self-pay | Admitting: Nurse Practitioner

## 2012-11-24 MED ORDER — HYDROCODONE-ACETAMINOPHEN 5-325 MG PO TABS: 1.0000 | ORAL_TABLET | Freq: Three times a day (TID) | ORAL | Status: DC | PRN

## 2012-11-24 NOTE — Telephone Encounter (Signed)
Let pt know rx ready for pick up

## 2012-11-24 NOTE — Telephone Encounter (Signed)
Last written 08/18/12 #90  Last seen  09/19/12  Print and have nurse call patient to pick up

## 2012-11-28 ENCOUNTER — Ambulatory Visit (INDEPENDENT_AMBULATORY_CARE_PROVIDER_SITE_OTHER): Payer: Medicare Other | Admitting: Gastroenterology

## 2012-11-28 ENCOUNTER — Encounter: Payer: Self-pay | Admitting: Gastroenterology

## 2012-11-28 VITALS — BP 99/60 | HR 60 | Temp 97.2°F | Ht 63.0 in | Wt 136.0 lb

## 2012-11-28 DIAGNOSIS — K5900 Constipation, unspecified: Secondary | ICD-10-CM | POA: Diagnosis not present

## 2012-11-28 DIAGNOSIS — R634 Abnormal weight loss: Secondary | ICD-10-CM | POA: Diagnosis not present

## 2012-11-28 NOTE — Assessment & Plan Note (Signed)
DUE TO CHRONIC ILLNESS. NL BMI.  CONTINUE TO MONITOR SYMPTOMS.

## 2012-11-28 NOTE — Progress Notes (Signed)
Reminder in epic °

## 2012-11-28 NOTE — Progress Notes (Signed)
Cc PCP 

## 2012-11-28 NOTE — Patient Instructions (Addendum)
DRINK WATER TO KEEP YOUR URINE LIGHT YELLOW.  FOLLOW A HIGH FIBER DIET. SEE INFO BELOW.  USE DULCOLAX AS NEEDED TO HELP BOWEL MOVE.  IF YOUR STOOLS ARE HARD, USE COLACE OR MIRALAX.  FOLLOW UP IN 6 MOS.    High-Fiber Diet A high-fiber diet changes your normal diet to include more whole grains, legumes, fruits, and vegetables. Changes in the diet involve replacing refined carbohydrates with unrefined foods. The calorie level of the diet is essentially unchanged. The Dietary Reference Intake (recommended amount) for adult males is 38 grams per day. For adult females, it is 25 grams per day. Pregnant and lactating women should consume 28 grams of fiber per day. Fiber is the intact part of a plant that is not broken down during digestion. Functional fiber is fiber that has been isolated from the plant to provide a beneficial effect in the body. PURPOSE  Increase stool bulk.   Ease and regulate bowel movements.   Lower cholesterol.  INDICATIONS THAT YOU NEED MORE FIBER  Constipation and hemorrhoids.   Uncomplicated diverticulosis (intestine condition) and irritable bowel syndrome.   Weight management.   As a protective measure against hardening of the arteries (atherosclerosis), diabetes, and cancer.   GUIDELINES FOR INCREASING FIBER IN THE DIET  Start adding fiber to the diet slowly. A gradual increase of about 5 more grams (2 slices of whole-wheat bread, 2 servings of most fruits or vegetables, or 1 bowl of high-fiber cereal) per day is best. Too rapid an increase in fiber may result in constipation, flatulence, and bloating.   Drink enough water and fluids to keep your urine clear or pale yellow. Water, juice, or caffeine-free drinks are recommended. Not drinking enough fluid may cause constipation.   Eat a variety of high-fiber foods rather than one type of fiber.   Try to increase your intake of fiber through using high-fiber foods rather than fiber pills or supplements that  contain small amounts of fiber.   The goal is to change the types of food eaten. Do not supplement your present diet with high-fiber foods, but replace foods in your present diet.  INCLUDE A VARIETY OF FIBER SOURCES  Replace refined and processed grains with whole grains, canned fruits with fresh fruits, and incorporate other fiber sources. White rice, white breads, and most bakery goods contain little or no fiber.   Brown whole-grain rice, buckwheat oats, and many fruits and vegetables are all good sources of fiber. These include: broccoli, Brussels sprouts, cabbage, cauliflower, beets, sweet potatoes, white potatoes (skin on), carrots, tomatoes, eggplant, squash, berries, fresh fruits, and dried fruits.   Cereals appear to be the richest source of fiber. Cereal fiber is found in whole grains and bran. Bran is the fiber-rich outer coat of cereal grain, which is largely removed in refining. In whole-grain cereals, the bran remains. In breakfast cereals, the largest amount of fiber is found in those with "bran" in their names. The fiber content is sometimes indicated on the label.   You may need to include additional fruits and vegetables each day.   In baking, for 1 cup white flour, you may use the following substitutions:   1 cup whole-wheat flour minus 2 tablespoons.   1/2 cup white flour plus 1/2 cup whole-wheat flour.

## 2012-11-28 NOTE — Progress Notes (Signed)
Subjective:    Patient ID: [name], female    DOB: 1935-04-16, 78 y.o.   MRN: [id number]  PCP: MOORE  HPI STILL HAVING BOWEL PROBLEMS. ABD PAIN WHEN BOWELS GET CONSTIPATED: AROUND NAVEL AND LLQ. FEELS LIKE HERNIAS ARE MORE NOTICEABLE. BMs: TAKES LAXATIVE EVERY 2-3 DAY. CAN FEEL COLD. RARE INDIGESTION 1-2X/WK DEPENDING ON WHAT SHE EATS. USES MAALOX PRN. GAINED 13 LBS SINCE OCT 2013.T DENIES FEVER, CHILLS, BRBPR, nausea, vomiting, melena, diarrhea, problems swallowing.   Past Medical History  Diagnosis Date  . Permanent atrial fibrillation     H/o tachybradycardia syndrome on Coumadin anticoagulation, has pacemaker.  [name] CAD (coronary artery disease) 2011    Catheterization August 2011: mild nonobstructive coronary artery disease complicated by large left rectus sheath hematoma status post evacuation Coumadin restarted without recurrence  . Diabetes mellitus   . Chronic airway obstruction, not elsewhere classified   . Anemia, unspecified   . Chronic kidney disease, stage II (mild)   . Unspecified hypertensive kidney disease with chronic kidney disease stage I through stage IV, or unspecified(403.90)   . Tachycardia-bradycardia     s/p St. Jude single chamber PPM 10/2010   . GERD (gastroesophageal reflux disease)   . Abnormal CT of the chest     Mediastinal and hilar adenopathy followed by Dr. Koleen Nimrod in [place]  . Congestive heart failure, unspecified     EF now 60%, previous nonischemic cardiomyopathy  . Pacemaker     Single chamber [place]. Jude ACCENT [phone] SN 719 04/11/1959 10/31/2010  . Valvular heart disease     a. H/o moderate mitral stenosis by cath 2011 but no mention of this on 10/2011 echo. b. 10/2011 echo: mod MR, mild AS, mild TR; EF>65%  . Hypercholesterolemia   . Diverticulitis Dec 2012  . Body mass index (BMI) of 20.0-20.9 in adult FEB 2013 147 LBS  . Recurrent UTI   . Neurogenic sleep apnea     Uses noctural oxygen prescribed by her pulmonologi  . Rheumatic heart  disease   . Gastritis     By EGD  . Carotid artery disease     Doppler, 05/2012, 0-39% bilateral  . Vitamin D deficiency     Past Surgical History  Procedure Laterality Date  . Single-chamber pacemaker implantation  3/12    by Dr Caryl Comes  . Evacuation of retroperitoneal and left rectus sheath    . Cholecystectomy      40+ years ago  . Hernia repair      X3  . Pilonidal cyst / sinus excision    . Esophagogastroduodenoscopy  10/2011    Cailyn Houdek-erosive gastritis, biopsy with no H. pylori, hiatal hernia, peptic duodenitis, no celiac disease, duodenal diverticulum, duodenal nodule  . Colonoscopy  10/2011    Dyann Ruddle sigmoid to descending diverticulosis, internal hemorrhoids  . Eus  11/16/11    Mishra-13 MM CBD, normal pancreatic duct, otherwise normal EUS    Allergies  Allergen Reactions  . Torsemide   . Tramadol Nausea Only    Current Outpatient Prescriptions  Medication Sig Dispense Refill  . citalopram (CELEXA) 20 MG tablet Take 20 mg by mouth daily.      . clonazePAM (KLONOPIN) 0.5 MG tablet Take 1 tablet (0.5 mg total) by mouth 3 (three) times daily as needed.    . digoxin (LANOXIN) 0.125 MG tablet Take 0.125 mg by mouth daily.    [name] diltiazem (CARDIZEM CD) 120 MG 24 hr capsule Take 1 capsule (120 mg total) by mouth daily.    [name]  docusate sodium (COLACE) 100 MG capsule Take 100 mg by mouth 2 (two) times daily.  NOT USING    . Fluticasone-Salmeterol (ADVAIR DISKUS) 250-50 MCG/DOSE AEPB Inhale 1 puff into the lungs every 12 (twelve) hours.        . furosemide (LASIX) 40 MG tablet Take 20 mg by mouth daily.       [name] glyBURIDE (DIABETA) 5 MG tablet Take 5 mg by mouth daily with breakfast.       . HYDROcodone-acetaminophen (NORCO/VICODIN) 5-325 MG per tablet Take 1 tablet by mouth 3 (three) times daily as needed. For pain    . metoprolol tartrate (LOPRESSOR) 25 MG tablet Take 1 tablet (25 mg total) by mouth 2 (two) times daily.    [name] omeprazole (PRILOSEC) 40 MG capsule Take 40 mg by  mouth daily.        . polyethylene glycol (MIRALAX / GLYCOLAX) packet Take 17 g by mouth daily as needed.  NOT USING    . PROAIR HFA 108 (90 BASE) MCG/ACT inhaler Take 2 puffs by mouth Every 6 hours as needed. For shortness of breath      .        . simvastatin (ZOCOR) 10 MG tablet Take 1 tablet (10 mg total) by mouth at bedtime.    .        . tiotropium (SPIRIVA) 18 MCG inhalation capsule Place 18 mcg into inhaler and inhale daily.        [name] warfarin (COUMADIN) 2 MG tablet Take 1-2 mg by mouth daily. *Take one-half tablet (1mg ) every day in the evening, except take one tablet on Wednesdays only*      . zafirlukast (ACCOLATE) 20 MG tablet Take 20 mg by mouth 2 (two) times daily.             Review of Systems     Objective:   Physical Exam  Vitals reviewed. Constitutional: She is oriented to person, place, and time. She appears well-nourished. No distress.  HENT:  Head: Normocephalic and atraumatic.  Mouth/Throat: No oropharyngeal exudate.  Eyes: Pupils are equal, round, and reactive to light. No scleral icterus.  Neck: Normal range of motion. Neck supple.  Cardiovascular: Normal rate and normal heart sounds.   Pulmonary/Chest: Effort normal and breath sounds normal. No respiratory distress.  Abdominal: Soft. Bowel sounds are normal. She exhibits no distension. There is tenderness. There is no rebound and no guarding.  MILD PERI-UMBILICAL TTP  Musculoskeletal: She exhibits no edema.  Lymphadenopathy:    She has no cervical adenopathy.  Neurological: She is alert and oriented to person, place, and time.  NO  NEW FOCAL DEFICITS   Skin:  HYPERPIGMENTED SKIN BIL LE  Psychiatric:  FLAT AFFECT, NL MOOD           Assessment & Plan:

## 2012-11-28 NOTE — Assessment & Plan Note (Signed)
IDIOPATHIC BUT MOST LIKELY DUE TO AGE, HERNIAS, MEDS, DIABETIC COLOPATHY, AND DIVRETICULOS  DRINK WATER EAT FIBER USE DULCOLAX AS NEEDED OPV IN 6 MOS

## 2012-12-07 NOTE — Telephone Encounter (Signed)
Was this taken care of?

## 2012-12-09 ENCOUNTER — Other Ambulatory Visit: Payer: Self-pay | Admitting: Nurse Practitioner

## 2012-12-12 NOTE — Telephone Encounter (Signed)
Last liver test 12/15/11

## 2012-12-13 ENCOUNTER — Ambulatory Visit (INDEPENDENT_AMBULATORY_CARE_PROVIDER_SITE_OTHER): Payer: Medicare Other | Admitting: Pharmacist Clinician (PhC)/ Clinical Pharmacy Specialist

## 2012-12-13 DIAGNOSIS — I4891 Unspecified atrial fibrillation: Secondary | ICD-10-CM

## 2012-12-22 DIAGNOSIS — E1149 Type 2 diabetes mellitus with other diabetic neurological complication: Secondary | ICD-10-CM | POA: Diagnosis not present

## 2012-12-22 DIAGNOSIS — L851 Acquired keratosis [keratoderma] palmaris et plantaris: Secondary | ICD-10-CM | POA: Diagnosis not present

## 2012-12-22 DIAGNOSIS — B351 Tinea unguium: Secondary | ICD-10-CM | POA: Diagnosis not present

## 2012-12-23 ENCOUNTER — Other Ambulatory Visit: Payer: Self-pay | Admitting: Nurse Practitioner

## 2012-12-23 MED ORDER — HYDROCODONE-ACETAMINOPHEN 5-325 MG PO TABS: 1.0000 | ORAL_TABLET | Freq: Three times a day (TID) | ORAL | Status: DC | PRN

## 2012-12-23 NOTE — Telephone Encounter (Signed)
RX ready for pick up 

## 2012-12-23 NOTE — Telephone Encounter (Signed)
Last filled 11/23/12 call [phone] for pickup

## 2012-12-23 NOTE — Telephone Encounter (Signed)
Pt aware, rx ready.

## 2012-12-28 ENCOUNTER — Other Ambulatory Visit: Payer: Self-pay | Admitting: Physician Assistant

## 2012-12-28 ENCOUNTER — Other Ambulatory Visit: Payer: Self-pay

## 2012-12-28 NOTE — Telephone Encounter (Signed)
xlast filled and seen 10/31/12   Phone in and have nurse call patient to inform

## 2012-12-29 MED ORDER — CLONAZEPAM 0.5 MG PO TABS: 0.5000 mg | ORAL_TABLET | Freq: Three times a day (TID) | ORAL | Status: DC | PRN

## 2012-12-29 NOTE — Telephone Encounter (Signed)
Please call in rx for clonazepam

## 2012-12-30 ENCOUNTER — Other Ambulatory Visit: Payer: Self-pay | Admitting: Nurse Practitioner

## 2012-12-30 NOTE — Telephone Encounter (Signed)
RX CALLED INTO PHARMACY

## 2013-01-04 ENCOUNTER — Other Ambulatory Visit: Payer: Self-pay | Admitting: Physician Assistant

## 2013-01-06 ENCOUNTER — Other Ambulatory Visit: Payer: Self-pay | Admitting: Nurse Practitioner

## 2013-01-06 ENCOUNTER — Other Ambulatory Visit: Payer: Self-pay | Admitting: Physician Assistant

## 2013-01-18 ENCOUNTER — Encounter: Payer: Self-pay | Admitting: Nurse Practitioner

## 2013-01-18 ENCOUNTER — Ambulatory Visit (INDEPENDENT_AMBULATORY_CARE_PROVIDER_SITE_OTHER): Payer: Medicare Other | Admitting: Nurse Practitioner

## 2013-01-18 VITALS — BP 116/62 | HR 66 | Temp 98.2°F | Ht 63.0 in | Wt 139.0 lb

## 2013-01-18 DIAGNOSIS — E119 Type 2 diabetes mellitus without complications: Secondary | ICD-10-CM | POA: Diagnosis not present

## 2013-01-18 DIAGNOSIS — R300 Dysuria: Secondary | ICD-10-CM

## 2013-01-18 DIAGNOSIS — E785 Hyperlipidemia, unspecified: Secondary | ICD-10-CM

## 2013-01-18 DIAGNOSIS — I4891 Unspecified atrial fibrillation: Secondary | ICD-10-CM | POA: Diagnosis not present

## 2013-01-18 DIAGNOSIS — I10 Essential (primary) hypertension: Secondary | ICD-10-CM | POA: Diagnosis not present

## 2013-01-18 LAB — POCT URINALYSIS DIPSTICK
Ketones, UA: NEGATIVE
Spec Grav, UA: 1.01
Urobilinogen, UA: NEGATIVE
pH, UA: 5

## 2013-01-18 LAB — COMPLETE METABOLIC PANEL WITH GFR
AST: 19 U/L (ref 0–37)
Albumin: 4.3 g/dL (ref 3.5–5.2)
Alkaline Phosphatase: 64 U/L (ref 39–117)
BUN: 38 mg/dL — ABNORMAL HIGH (ref 6–23)
Creat: 1.38 mg/dL — ABNORMAL HIGH (ref 0.50–1.10)
GFR, Est Non African American: 37 mL/min — ABNORMAL LOW
Glucose, Bld: 102 mg/dL — ABNORMAL HIGH (ref 70–99)
Total Bilirubin: 0.5 mg/dL (ref 0.3–1.2)

## 2013-01-18 LAB — POCT UA - MICROSCOPIC ONLY
Casts, Ur, LPF, POC: NEGATIVE
Yeast, UA: NEGATIVE

## 2013-01-18 MED ORDER — CIPROFLOXACIN HCL 500 MG PO TABS: 500.0000 mg | ORAL_TABLET | Freq: Two times a day (BID) | ORAL | Status: DC

## 2013-01-18 NOTE — Progress Notes (Addendum)
Subjective:    Patient ID: [name], female    DOB: Aug 09, 1935, 78 y.o.   MRN: [id number]  Hypertension This is a chronic problem. The current episode started more than 1 year ago. The problem has been resolved since onset. The problem is controlled. Pertinent negatives include no blurred vision, chest pain, peripheral edema or shortness of breath. Risk factors for coronary artery disease include diabetes mellitus, dyslipidemia, family history and smoking/tobacco exposure. Past treatments include beta blockers and calcium channel blockers. The current treatment provides moderate improvement. There is no history of a thyroid problem.  Hyperlipidemia This is a chronic problem. The current episode started more than 1 year ago. The problem is uncontrolled. Recent lipid tests were reviewed and are high. Exacerbating diseases include diabetes. Pertinent negatives include no chest pain or shortness of breath. Current antihyperlipidemic treatment includes statins. The current treatment provides mild improvement of lipids. Compliance problems include adherence to exercise.  Risk factors for coronary artery disease include diabetes mellitus, dyslipidemia, family history, hypertension and post-menopausal.  Diabetes She presents for her follow-up diabetic visit. She has type 2 diabetes mellitus. Her disease course has been stable. There are no hypoglycemic associated symptoms. Pertinent negatives for diabetes include no blurred vision, no chest pain and no visual change. There are no hypoglycemic complications. Symptoms are stable. Pertinent negatives for diabetic complications include no peripheral neuropathy. Risk factors for coronary artery disease include diabetes mellitus, dyslipidemia, family history, hypertension, tobacco exposure and post-menopausal. Current diabetic treatment includes oral agent (monotherapy). She is compliant with treatment all of the time. Her weight is stable. She is following a  diabetic diet. She has not had a previous visit with a dietician. She rarely participates in exercise. Her breakfast blood glucose is taken after 10 am. Her breakfast blood glucose range is generally 110-130 mg/dl. An ACE inhibitor/angiotensin II receptor blocker is not being taken. She sees a podiatrist (Two weeks ago).Eye exam is current (pt states she "had last year").  Urinary Tract Infection  This is a new problem. The current episode started in the past 7 days (last two days). The problem has been gradually worsening. The quality of the pain is described as aching (lower abd soreness pain). The pain is at a severity of 4/10. There has been no fever. She is not sexually active. Pertinent negatives include no chills, hematuria or hesitancy. She has tried nothing for the symptoms. The treatment provided no relief.    COPD- Pt states she feels as if her COPD is controlled at time. She takes O2 at home and states it runs 98%. Pt states she uses her rescue inhaler twice a day, but doesn't feel like she needs it. Pt educated on only using when needed.  Review of Systems  Constitutional: Negative for chills.  HENT: Positive for congestion, sneezing and postnasal drip.   Eyes: Negative for blurred vision.  Respiratory: Negative for shortness of breath.   Cardiovascular: Negative for chest pain.  Gastrointestinal: Positive for abdominal pain and constipation.  Genitourinary: Negative for hesitancy and hematuria.       Objective:   Physical Exam  Constitutional: She is oriented to person, place, and time. She appears well-developed and well-nourished.  HENT:  Head: Normocephalic.  Eyes: Pupils are equal, round, and reactive to light.  Neck: Normal range of motion. Neck supple. No thyromegaly present.  Cardiovascular: Normal rate, regular rhythm and normal heart sounds.   Pulmonary/Chest: Effort normal.  Diminished breath sounds bilaterally   Abdominal: Soft. Bowel sounds are  normal. There is  tenderness (lower abd).  Musculoskeletal: Normal range of motion. She exhibits no edema.  Neurological: She is alert and oriented to person, place, and time.  Skin: Skin is warm and dry.  Circulatory discoloration bilaterally LE   Psychiatric: She has a normal mood and affect. Her behavior is normal. Judgment and thought content normal.   BP 116/62  Pulse 66  Temp(Src) 98.2 F (36.8 C) (Oral)  Ht 5\' 3"  (1.6 m)  Wt 139 lb (63.05 kg)  BMI 24.63 kg/m2  Results for orders placed in visit on 01/18/13  POCT GLYCOSYLATED HEMOGLOBIN (HGB A1C)      Result Value Range   Hemoglobin A1C 5.5 %    POCT INR      Result Value Range   INR 2.7    POCT URINALYSIS DIPSTICK      Result Value Range   Color, UA gold     Clarity, UA clear     Glucose, UA negative     Bilirubin, UA negative     Ketones, UA negative     Spec Grav, UA 1.010     Blood, UA trace     pH, UA 5.0     Protein, UA trace     Urobilinogen, UA negative     Nitrite, UA negative     Leukocytes, UA small (1+)    POCT UA - MICROSCOPIC ONLY      Result Value Range   WBC, Ur, HPF, POC 10-20     RBC, urine, microscopic 3-5     Bacteria, U Microscopic few     Mucus, UA occ     Epithelial cells, urine per micros few     Crystals, Ur, HPF, POC negative     Casts, Ur, LPF, POC negative     Yeast, UA negative            Assessment & Plan:  1. DM -Low Carb diet Rx for diabetic shoes - POCT glycosylated hemoglobin (Hb A1C)  2. HYPERTENSION -Low NA diet -Encourage excersie - COMPLETE METABOLIC PANEL WITH GFR  3. Other and unspecified hyperlipidemia -Low fat diet - NMR Lipoprofile with Lipids  4. A-fib -Continue current dose of warfarin - POCT INR  5. COPD - Continue current meds -Only use rescue inhalers as needed  6. UTI Cipro 500 bid X 7 days  Follow up in 3 months Health Maintenance discussed  [place], FNP

## 2013-01-18 NOTE — Patient Instructions (Signed)

## 2013-01-19 LAB — NMR LIPOPROFILE WITH LIPIDS
Cholesterol, Total: 125 mg/dL (ref <200)
HDL Size: 9.1 nm — ABNORMAL LOW (ref >=9.2)
LDL (calc): 78 mg/dL (ref <100)
LDL Particle Number: 1275 nmol/L — ABNORMAL HIGH (ref <1000)
LP-IR Score: 46 — ABNORMAL HIGH (ref <=45)
Large VLDL-P: 1.3 nmol/L (ref <=2.7)
Triglycerides: 68 mg/dL (ref <150)
VLDL Size: 52.5 nm — ABNORMAL HIGH (ref <=46.6)

## 2013-01-24 ENCOUNTER — Other Ambulatory Visit: Payer: Self-pay | Admitting: *Deleted

## 2013-01-24 MED ORDER — HYDROCODONE-ACETAMINOPHEN 5-325 MG PO TABS: 1.0000 | ORAL_TABLET | Freq: Three times a day (TID) | ORAL | Status: DC | PRN

## 2013-01-24 NOTE — Telephone Encounter (Signed)
Patient aware to pick up 

## 2013-01-24 NOTE — Telephone Encounter (Signed)
Last seen 01/18/13, last filled 12/23/12. Will print, call pt to pick up

## 2013-01-24 NOTE — Telephone Encounter (Signed)
rx ready for pickup 

## 2013-01-27 ENCOUNTER — Telehealth: Payer: Self-pay | Admitting: Nurse Practitioner

## 2013-01-27 ENCOUNTER — Other Ambulatory Visit: Payer: Self-pay

## 2013-01-27 MED ORDER — TIOTROPIUM BROMIDE MONOHYDRATE 18 MCG IN CAPS: 18.0000 ug | ORAL_CAPSULE | Freq: Every day | RESPIRATORY_TRACT | Status: DC

## 2013-01-27 NOTE — Telephone Encounter (Signed)
Last filled 12/28/12   Last seen 01/18/13    If approved phone in and have nurse notify patient

## 2013-01-27 NOTE — Telephone Encounter (Signed)
What does patient need 

## 2013-01-30 ENCOUNTER — Telehealth: Payer: Self-pay | Admitting: Nurse Practitioner

## 2013-01-31 ENCOUNTER — Other Ambulatory Visit: Payer: Self-pay | Admitting: Nurse Practitioner

## 2013-01-31 MED ORDER — CLONAZEPAM 0.5 MG PO TABS: 0.5000 mg | ORAL_TABLET | Freq: Three times a day (TID) | ORAL | Status: DC | PRN

## 2013-01-31 NOTE — Telephone Encounter (Signed)
Med called to pharm 

## 2013-01-31 NOTE — Telephone Encounter (Signed)
Last seen 01/18/13, last filled 12/28/12, call into [facility]

## 2013-01-31 NOTE — Telephone Encounter (Signed)
Please call in rx for clonazepam with 1 refill 

## 2013-02-07 ENCOUNTER — Other Ambulatory Visit: Payer: Self-pay | Admitting: Nurse Practitioner

## 2013-02-17 ENCOUNTER — Encounter: Payer: Self-pay | Admitting: Internal Medicine

## 2013-02-17 ENCOUNTER — Ambulatory Visit (INDEPENDENT_AMBULATORY_CARE_PROVIDER_SITE_OTHER): Payer: Medicare Other | Admitting: Internal Medicine

## 2013-02-17 VITALS — BP 105/66 | HR 69 | Ht 63.0 in | Wt 139.0 lb

## 2013-02-17 DIAGNOSIS — I495 Sick sinus syndrome: Secondary | ICD-10-CM

## 2013-02-17 DIAGNOSIS — I4891 Unspecified atrial fibrillation: Secondary | ICD-10-CM

## 2013-02-17 LAB — PACEMAKER DEVICE OBSERVATION
BMOD-0002RV: 8
BRDY-0002RV: 60 {beats}/min
BRDY-0004RV: 120 {beats}/min
BRDY-0005RV: 50 {beats}/min
RV LEAD AMPLITUDE: 12 mv
RV LEAD THRESHOLD: 0.75 V
VENTRICULAR PACING PM: 22

## 2013-02-17 NOTE — Patient Instructions (Addendum)
Continue all current medications. Allred - 1 year

## 2013-02-17 NOTE — Progress Notes (Signed)
Primary Cardiologist:  Dr Dannielle Burn previously PCP:  Dr Quillian Quince  The patient presents today for routine electrophysiology followup.  Today, she denies symptoms of  chest pain, shortness of breath, dizziness, presyncope, syncope, or neurologic sequela. She has stable edema. The patient feels that she is tolerating medications without difficulties and is otherwise without complaint today.   Past Medical History  Diagnosis Date  . Permanent atrial fibrillation     H/o tachybradycardia syndrome on Coumadin anticoagulation, has pacemaker.  [name] CAD (coronary artery disease) 2011    Catheterization August 2011: mild nonobstructive coronary artery disease complicated by large left rectus sheath hematoma status post evacuation Coumadin restarted without recurrence  . Diabetes mellitus   . Chronic airway obstruction, not elsewhere classified   . Anemia, unspecified   . Chronic kidney disease, stage II (mild)   . Unspecified hypertensive kidney disease with chronic kidney disease stage I through stage IV, or unspecified(403.90)   . Tachycardia-bradycardia     s/p St. Jude single chamber PPM 10/2010   . GERD (gastroesophageal reflux disease)   . Abnormal CT of the chest     Mediastinal and hilar adenopathy followed by Dr. Koleen Nimrod in [place]  . Congestive heart failure, unspecified     EF now 60%, previous nonischemic cardiomyopathy  . Pacemaker     Single chamber [place]. Jude ACCENT [phone] SN 719 04/11/1959 10/31/2010  . Valvular heart disease     a. H/o moderate mitral stenosis by cath 2011 but no mention of this on 10/2011 echo. b. 10/2011 echo: mod MR, mild AS, mild TR; EF>65%  . Hypercholesterolemia   . Diverticulitis Dec 2012  . Body mass index (BMI) of 20.0-20.9 in adult FEB 2013 147 LBS  . Recurrent UTI   . Neurogenic sleep apnea     Uses noctural oxygen prescribed by her pulmonologi  . Rheumatic heart disease   . Gastritis     By EGD  . Carotid artery disease     Doppler, 05/2012, 0-39% bilateral   . Vitamin D deficiency    Past Surgical History  Procedure Laterality Date  . Single-chamber pacemaker implantation  3/12    by Dr Caryl Comes  . Evacuation of retroperitoneal and left rectus sheath    . Cholecystectomy      40+ years ago  . Hernia repair      X3  . Pilonidal cyst / sinus excision    . Esophagogastroduodenoscopy  10/2011    Fields-erosive gastritis, biopsy with no H. pylori, hiatal hernia, peptic duodenitis, no celiac disease, duodenal diverticulum, duodenal nodule  . Colonoscopy  10/2011    Dyann Ruddle sigmoid to descending diverticulosis, internal hemorrhoids  . Eus  11/16/11    Mishra-13 MM CBD, normal pancreatic duct, otherwise normal EUS    Current Outpatient Prescriptions  Medication Sig Dispense Refill  . ADVAIR DISKUS 250-50 MCG/DOSE AEPB INHALE ONE PUFF BY MOUTH TWICE DAILY  60 each  5  . clonazePAM (KLONOPIN) 0.5 MG tablet Take 1 tablet (0.5 mg total) by mouth 3 (three) times daily as needed.  90 tablet  1  . diltiazem (CARDIZEM CD) 120 MG 24 hr capsule TAKE ONE CAPSULE BY MOUTH ONE TIME DAILY  30 capsule  2  . furosemide (LASIX) 40 MG tablet Take 20 mg by mouth daily.       [name] glyBURIDE (DIABETA) 5 MG tablet       . HYDROcodone-acetaminophen (NORCO/VICODIN) 5-325 MG per tablet Take 1 tablet by mouth 3 (three) times daily as needed. For  pain  90 tablet  0  . metoprolol tartrate (LOPRESSOR) 25 MG tablet TAKE ONE TABLET BY MOUTH TWICE DAILY  60 tablet  1  . omeprazole (PRILOSEC) 40 MG capsule TAKE ONE CAPSULE BY MOUTH ONE TIME DAILY  30 capsule  2  . polyethylene glycol (MIRALAX / GLYCOLAX) packet Take 17 g by mouth daily as needed.      [name] PROAIR HFA 108 (90 BASE) MCG/ACT inhaler Take 2 puffs by mouth Every 6 hours as needed. For shortness of breath      . simvastatin (ZOCOR) 10 MG tablet TAKE ONE TABLET BY MOUTH ONE TIME DAILY  30 tablet  5  . tiotropium (SPIRIVA) 18 MCG inhalation capsule Place 1 capsule (18 mcg total) into inhaler and inhale daily.  30 capsule  5   . warfarin (COUMADIN) 2 MG tablet Take 1-2 mg by mouth daily. *Take one-half tablet (1mg ) every day in the evening, except take one tablet on Wednesdays only*      . zafirlukast (ACCOLATE) 20 MG tablet TAKE ONE TABLET BY MOUTH TWICE DAILY  60 tablet  5   No current facility-administered medications for this visit.    Allergies  Allergen Reactions  . Torsemide   . Tramadol Nausea Only    History   Social History  . Marital Status: Single    Spouse Name: N/A    Number of Children: 2  . Years of Education: N/A   Occupational History  . RETIRED    Social History Main Topics  . Smoking status: Former Smoker -- 2.00 packs/day for 45 years    Types: Cigarettes    Quit date: 08/10/2006  . Smokeless tobacco: Never Used  . Alcohol Use: No  . Drug Use: No  . Sexually Active: Not on file   Other Topics Concern  . Not on file   Social History Narrative  . No narrative on file    Family History  Problem Relation Age of Onset  . Cancer Mother     Uterus  . Cancer Daughter     kidney cancer, in remission  . Heart disease Father   . Colon cancer Neg Hx     Physical Exam: Filed Vitals:   02/17/13 1122  BP: 105/66  Pulse: 69  Height: 5\' 3"  (1.6 m)  Weight: 139 lb (63.05 kg)    GEN- The patient is elderly appearing, alert and oriented x 3 today.   Head- normocephalic, atraumatic Eyes-  Sclera clear, conjunctiva pink Ears- hearing intact Oropharynx- clear Neck- supple, no JVP Lymph- no cervical lymphadenopathy Lungs- Clear to ausculation bilaterally, normal work of breathing Chest- pacemaker pocket is well healed Heart- Regular rate and rhythm, 2/6 SEM LSB GI- soft, NT, ND, + BS Extremities- no clubbing, cyanosis, 1+edema  Pacemaker interrogation- reviewed in detail today,  See PACEART report  Assessment and Plan:  1. Tachycardia/ bradycardia syndrome Normal pacemaker function See Pace Art report No changes today  2. Permanent afib Continue long term  anticoagulation  Return in 1year

## 2013-02-18 ENCOUNTER — Encounter: Payer: Self-pay | Admitting: Cardiovascular Disease

## 2013-02-18 DIAGNOSIS — IMO0002 Reserved for concepts with insufficient information to code with codable children: Secondary | ICD-10-CM | POA: Diagnosis not present

## 2013-02-18 DIAGNOSIS — Z7901 Long term (current) use of anticoagulants: Secondary | ICD-10-CM | POA: Diagnosis not present

## 2013-02-18 DIAGNOSIS — F17200 Nicotine dependence, unspecified, uncomplicated: Secondary | ICD-10-CM | POA: Diagnosis not present

## 2013-02-18 DIAGNOSIS — R109 Unspecified abdominal pain: Secondary | ICD-10-CM | POA: Diagnosis not present

## 2013-02-18 DIAGNOSIS — I059 Rheumatic mitral valve disease, unspecified: Secondary | ICD-10-CM | POA: Diagnosis not present

## 2013-02-18 DIAGNOSIS — Z79899 Other long term (current) drug therapy: Secondary | ICD-10-CM | POA: Diagnosis not present

## 2013-02-18 DIAGNOSIS — K5730 Diverticulosis of large intestine without perforation or abscess without bleeding: Secondary | ICD-10-CM | POA: Diagnosis not present

## 2013-02-18 DIAGNOSIS — J449 Chronic obstructive pulmonary disease, unspecified: Secondary | ICD-10-CM | POA: Diagnosis not present

## 2013-02-18 DIAGNOSIS — D649 Anemia, unspecified: Secondary | ICD-10-CM | POA: Diagnosis not present

## 2013-02-18 DIAGNOSIS — E119 Type 2 diabetes mellitus without complications: Secondary | ICD-10-CM | POA: Diagnosis not present

## 2013-02-21 ENCOUNTER — Other Ambulatory Visit: Payer: Self-pay | Admitting: Pharmacist Clinician (PhC)/ Clinical Pharmacy Specialist

## 2013-02-21 ENCOUNTER — Ambulatory Visit (INDEPENDENT_AMBULATORY_CARE_PROVIDER_SITE_OTHER): Payer: Medicare Other | Admitting: Pharmacist Clinician (PhC)/ Clinical Pharmacy Specialist

## 2013-02-21 DIAGNOSIS — I4891 Unspecified atrial fibrillation: Secondary | ICD-10-CM

## 2013-02-21 LAB — POCT INR: INR: 2.4

## 2013-02-24 ENCOUNTER — Encounter: Payer: Self-pay | Admitting: Internal Medicine

## 2013-02-26 ENCOUNTER — Other Ambulatory Visit: Payer: Self-pay | Admitting: Nurse Practitioner

## 2013-03-01 ENCOUNTER — Telehealth: Payer: Self-pay | Admitting: Nurse Practitioner

## 2013-03-02 DIAGNOSIS — B351 Tinea unguium: Secondary | ICD-10-CM | POA: Diagnosis not present

## 2013-03-02 DIAGNOSIS — L851 Acquired keratosis [keratoderma] palmaris et plantaris: Secondary | ICD-10-CM | POA: Diagnosis not present

## 2013-03-02 DIAGNOSIS — E1149 Type 2 diabetes mellitus with other diabetic neurological complication: Secondary | ICD-10-CM | POA: Diagnosis not present

## 2013-03-03 NOTE — Telephone Encounter (Signed)
Forms were resent today, pt aware

## 2013-03-10 ENCOUNTER — Ambulatory Visit: Payer: Medicare Other | Admitting: Cardiovascular Disease

## 2013-03-23 ENCOUNTER — Other Ambulatory Visit: Payer: Self-pay | Admitting: Nurse Practitioner

## 2013-03-23 DIAGNOSIS — H524 Presbyopia: Secondary | ICD-10-CM | POA: Diagnosis not present

## 2013-03-23 DIAGNOSIS — E119 Type 2 diabetes mellitus without complications: Secondary | ICD-10-CM | POA: Diagnosis not present

## 2013-03-23 DIAGNOSIS — H2589 Other age-related cataract: Secondary | ICD-10-CM | POA: Diagnosis not present

## 2013-03-23 DIAGNOSIS — H52229 Regular astigmatism, unspecified eye: Secondary | ICD-10-CM | POA: Diagnosis not present

## 2013-03-23 LAB — HM DIABETES EYE EXAM

## 2013-03-24 ENCOUNTER — Telehealth: Payer: Self-pay | Admitting: Nurse Practitioner

## 2013-03-24 MED ORDER — HYDROCODONE-ACETAMINOPHEN 5-325 MG PO TABS: 1.0000 | ORAL_TABLET | Freq: Three times a day (TID) | ORAL | Status: DC | PRN

## 2013-03-24 NOTE — Telephone Encounter (Signed)
rx ready for pickup 

## 2013-03-24 NOTE — Telephone Encounter (Signed)
Up front 

## 2013-03-24 NOTE — Telephone Encounter (Signed)
Last filled 01/24/13, last seen 01/18/13, will print, call pt to pickup

## 2013-03-25 ENCOUNTER — Other Ambulatory Visit: Payer: Self-pay | Admitting: Physician Assistant

## 2013-03-27 NOTE — Telephone Encounter (Signed)
DONE

## 2013-03-29 ENCOUNTER — Other Ambulatory Visit: Payer: Self-pay | Admitting: *Deleted

## 2013-03-29 MED ORDER — CLONAZEPAM 0.5 MG PO TABS: 0.5000 mg | ORAL_TABLET | Freq: Three times a day (TID) | ORAL | Status: DC | PRN

## 2013-03-29 NOTE — Telephone Encounter (Signed)
Please call in clonazepam rx with 1 refill

## 2013-03-29 NOTE — Telephone Encounter (Signed)
LAST OV 01/18/13. LAT RF 02/27/13. CALL IN [facility] IF APPROVED.

## 2013-03-30 NOTE — Telephone Encounter (Signed)
CALLED IN 

## 2013-04-01 ENCOUNTER — Other Ambulatory Visit: Payer: Self-pay | Admitting: Nurse Practitioner

## 2013-04-03 ENCOUNTER — Ambulatory Visit (INDEPENDENT_AMBULATORY_CARE_PROVIDER_SITE_OTHER): Payer: Medicare Other | Admitting: Pharmacist

## 2013-04-03 DIAGNOSIS — I4891 Unspecified atrial fibrillation: Secondary | ICD-10-CM

## 2013-04-03 LAB — POCT INR: INR: 2.8

## 2013-04-03 NOTE — Telephone Encounter (Signed)
EPIC MED LIST HAD USE Q6 HOURS AND RX REQUEST HAS Q3-4 HOURS. PLEASE REVIEW. LAST OV 01/18/13.

## 2013-04-07 ENCOUNTER — Other Ambulatory Visit: Payer: Self-pay | Admitting: Physician Assistant

## 2013-04-07 ENCOUNTER — Other Ambulatory Visit: Payer: Self-pay | Admitting: Nurse Practitioner

## 2013-04-18 ENCOUNTER — Ambulatory Visit (INDEPENDENT_AMBULATORY_CARE_PROVIDER_SITE_OTHER): Payer: Medicare Other | Admitting: Cardiovascular Disease

## 2013-04-18 ENCOUNTER — Encounter: Payer: Self-pay | Admitting: Cardiovascular Disease

## 2013-04-18 VITALS — BP 103/66 | HR 67 | Ht 63.0 in | Wt 141.0 lb

## 2013-04-18 DIAGNOSIS — I4891 Unspecified atrial fibrillation: Secondary | ICD-10-CM

## 2013-04-18 DIAGNOSIS — I080 Rheumatic disorders of both mitral and aortic valves: Secondary | ICD-10-CM

## 2013-04-18 DIAGNOSIS — I779 Disorder of arteries and arterioles, unspecified: Secondary | ICD-10-CM

## 2013-04-18 DIAGNOSIS — I359 Nonrheumatic aortic valve disorder, unspecified: Secondary | ICD-10-CM

## 2013-04-18 DIAGNOSIS — Z79899 Other long term (current) drug therapy: Secondary | ICD-10-CM | POA: Diagnosis not present

## 2013-04-18 DIAGNOSIS — I10 Essential (primary) hypertension: Secondary | ICD-10-CM

## 2013-04-18 DIAGNOSIS — Z950 Presence of cardiac pacemaker: Secondary | ICD-10-CM

## 2013-04-18 DIAGNOSIS — I350 Nonrheumatic aortic (valve) stenosis: Secondary | ICD-10-CM

## 2013-04-18 MED ORDER — METOPROLOL TARTRATE 25 MG PO TABS: 25.0000 mg | ORAL_TABLET | Freq: Two times a day (BID) | ORAL | Status: DC

## 2013-04-18 MED ORDER — DILTIAZEM HCL ER COATED BEADS 120 MG PO CP24: 120.0000 mg | ORAL_CAPSULE | Freq: Every day | ORAL | Status: DC

## 2013-04-18 NOTE — Patient Instructions (Signed)
   Refills sent to pharm for Metoprolol & Diltiazem Continue all current medications. Your physician wants you to follow up in:  1 year.  You will receive a reminder letter in the mail one-two months in advance.  If you don't receive a letter, please call our office to schedule the follow up appointment

## 2013-04-18 NOTE — Progress Notes (Signed)
Patient ID: [name], female   DOB: March 30, 1935, 78 y.o.   MRN: [id number]   SUBJECTIVE: [name] has a h/o permanent atrial fibrillation with a pacemaker, mild nonobstructive CAD, a previous tachycardia-induced cardiomyopathy which has since normalized in function, and mitral stenosis (by cath, not mentioned on most recent echo report) and moderate mitral regurgitation, with mild aortic stenosis. Her most recent echocardiogram was in March 2013.  The patient denies any symptoms of chest pain, palpitations, shortness of breath, lightheadedness, dizziness, leg swelling, orthopnea, PND, and syncope.  She smokes 0.5 ppd. She has COPD.   Allergies  Allergen Reactions  . Torsemide   . Tramadol Nausea Only    Current Outpatient Prescriptions  Medication Sig Dispense Refill  . ADVAIR DISKUS 250-50 MCG/DOSE AEPB INHALE ONE PUFF BY MOUTH TWICE DAILY  60 each  5  . clonazePAM (KLONOPIN) 0.5 MG tablet Take 1 tablet (0.5 mg total) by mouth 3 (three) times daily as needed.  90 tablet  1  . diltiazem (CARDIZEM CD) 120 MG 24 hr capsule TAKE ONE CAPSULE BY MOUTH ONE TIME DAILY  30 capsule  0  . furosemide (LASIX) 40 MG tablet Take 20 mg by mouth daily.       [name] glyBURIDE (DIABETA) 5 MG tablet TAKE ONE TABLET BY MOUTH ONE TIME DAILY  30 tablet  2  . HYDROcodone-acetaminophen (NORCO/VICODIN) 5-325 MG per tablet Take 1 tablet by mouth 3 (three) times daily as needed. For pain  90 tablet  0  . metoprolol tartrate (LOPRESSOR) 25 MG tablet TAKE ONE TABLET BY MOUTH TWICE DAILY  60 tablet  0  . omeprazole (PRILOSEC) 40 MG capsule TAKE ONE CAPSULE BY MOUTH ONE TIME DAILY  30 capsule  2  . polyethylene glycol (MIRALAX / GLYCOLAX) packet Take 17 g by mouth daily as needed.      [name] PROAIR HFA 108 (90 BASE) MCG/ACT inhaler USE 2 PUFFS BY MOUTH EVERY 3 TO 4 HOURS WHILE AWAKE AS NEEDED  9 g  3  . simvastatin (ZOCOR) 10 MG tablet TAKE ONE TABLET BY MOUTH ONE TIME DAILY  30 tablet  5  . tiotropium (SPIRIVA) 18  MCG inhalation capsule Place 1 capsule (18 mcg total) into inhaler and inhale daily.  30 capsule  5  . warfarin (COUMADIN) 2 MG tablet Take 1-2 mg by mouth daily. *Take one-half tablet (1mg ) every day in the evening, except take one tablet on Wednesdays only*      . zafirlukast (ACCOLATE) 20 MG tablet TAKE ONE TABLET BY MOUTH TWICE DAILY  60 tablet  5   No current facility-administered medications for this visit.    Past Medical History  Diagnosis Date  . Permanent atrial fibrillation     H/o tachybradycardia syndrome on Coumadin anticoagulation, has pacemaker.  [name] CAD (coronary artery disease) 2011    Catheterization August 2011: mild nonobstructive coronary artery disease complicated by large left rectus sheath hematoma status post evacuation Coumadin restarted without recurrence  . Diabetes mellitus   . Chronic airway obstruction, not elsewhere classified   . Anemia, unspecified   . Chronic kidney disease, stage II (mild)   . Unspecified hypertensive kidney disease with chronic kidney disease stage I through stage IV, or unspecified(403.90)   . Tachycardia-bradycardia     s/p St. Jude single chamber PPM 10/2010   . GERD (gastroesophageal reflux disease)   . Abnormal CT of the chest     Mediastinal and hilar adenopathy followed by Dr. Koleen Nimrod in [place]  .  Congestive heart failure, unspecified     EF now 60%, previous nonischemic cardiomyopathy  . Pacemaker     Single chamber [place]. Jude ACCENT [phone] SN [id number] 04/11/1959 10/31/2010  . Valvular heart disease     a. H/o moderate mitral stenosis by cath 2011 but no mention of this on 10/2011 echo. b. 10/2011 echo: mod MR, mild AS, mild TR; EF>65%  . Hypercholesterolemia   . Diverticulitis Dec 2012  . Body mass index (BMI) of 20.0-20.9 in adult FEB 2013 147 LBS  . Recurrent UTI   . Neurogenic sleep apnea     Uses noctural oxygen prescribed by her pulmonologi  . Rheumatic heart disease   . Gastritis     By EGD  . Carotid artery disease      Doppler, 05/2012, 0-39% bilateral  . Vitamin D deficiency     Past Surgical History  Procedure Laterality Date  . Single-chamber pacemaker implantation  3/12    by Dr Caryl Comes  . Evacuation of retroperitoneal and left rectus sheath    . Cholecystectomy      40+ years ago  . Hernia repair      X3  . Pilonidal cyst / sinus excision    . Esophagogastroduodenoscopy  10/2011    Fields-erosive gastritis, biopsy with no H. pylori, hiatal hernia, peptic duodenitis, no celiac disease, duodenal diverticulum, duodenal nodule  . Colonoscopy  10/2011    Dyann Ruddle sigmoid to descending diverticulosis, internal hemorrhoids  . Eus  11/16/11    Mishra-13 MM CBD, normal pancreatic duct, otherwise normal EUS    History   Social History  . Marital Status: Single    Spouse Name: N/A    Number of Children: 2  . Years of Education: N/A   Occupational History  . RETIRED    Social History Main Topics  . Smoking status: Former Smoker -- 2.00 packs/day for 45 years    Types: Cigarettes    Quit date: 08/10/2006  . Smokeless tobacco: Never Used  . Alcohol Use: No  . Drug Use: No  . Sexual Activity: Not on file   Other Topics Concern  . Not on file   Social History Narrative  . No narrative on file     BP 103/66 Pulse 67   PHYSICAL EXAM General: NAD Neck: No JVD, no thyromegaly or thyroid nodule.  Lungs: Clear to auscultation bilaterally with normal respiratory effort. CV: Nondisplaced PMI.  Heart regular S1/S2, no [id number], III/VI systolic murmur at RUSB and apex.  No peripheral edema.  No carotid bruit.  Normal pedal pulses.  Abdomen: Soft, nontender, no hepatosplenomegaly, no distention.  Neurologic: Alert and oriented x 3.  Psych: Normal affect. Extremities: No clubbing or cyanosis.   ECG: reviewed and available in electronic records.      ASSESSMENT AND PLAN: 1. Permanent atrial fibrillation s/p pacemaker: stable. Continue Warfarin, Metoprolol, and Diltiazem. No bleeding  complications noted. 2. Valvular heart disease: stable from a symptomatic standpoint. Continue monitoring. 3. Carotid artery stenosis: mild bilaterally in October 2013.   Kate Sable, M.D., F.A.C.C.

## 2013-04-21 ENCOUNTER — Other Ambulatory Visit: Payer: Self-pay

## 2013-04-21 MED ORDER — HYDROCODONE-ACETAMINOPHEN 5-325 MG PO TABS: 1.0000 | ORAL_TABLET | Freq: Three times a day (TID) | ORAL | Status: DC | PRN

## 2013-04-21 NOTE — Telephone Encounter (Signed)
Last seen 01/18/13  MMM  If approved print and route to nurse

## 2013-04-21 NOTE — Telephone Encounter (Signed)
rx ready for pickup 

## 2013-04-24 ENCOUNTER — Telehealth: Payer: Self-pay

## 2013-04-24 ENCOUNTER — Telehealth: Payer: Self-pay | Admitting: Nurse Practitioner

## 2013-04-24 DIAGNOSIS — R197 Diarrhea, unspecified: Secondary | ICD-10-CM

## 2013-04-24 NOTE — Telephone Encounter (Signed)
Called and informed pt. Lab orders faxed to [facility] fax number [phone].

## 2013-04-24 NOTE — Telephone Encounter (Signed)
PLEASE CALL PT. If she is having diarrhea & burning in her stomach, she should have a Urinalysis. She sees Dr. Oneida Alar for constipation. She may submit stool for C Diff & use Kaopectate prn for loose stools. If she needs pain meds she should see her PCP. She can submit sample at her PCP OR IN [place].

## 2013-04-24 NOTE — Telephone Encounter (Signed)
Pt would like to have something called in to [facility] in [place] for  Her diarrhea and burning in her stomach. She has an appointment on Oct 8 with [facility]. Please advise

## 2013-04-25 ENCOUNTER — Other Ambulatory Visit (INDEPENDENT_AMBULATORY_CARE_PROVIDER_SITE_OTHER): Payer: Medicare Other

## 2013-04-25 DIAGNOSIS — R197 Diarrhea, unspecified: Secondary | ICD-10-CM | POA: Diagnosis not present

## 2013-04-25 NOTE — Telephone Encounter (Signed)
Patient aware to be picked up

## 2013-04-25 NOTE — Progress Notes (Signed)
Patient came in for labs only.

## 2013-04-25 NOTE — Telephone Encounter (Signed)
done

## 2013-04-25 NOTE — Addendum Note (Signed)
Addended by: Pollyann Kennedy F on: 04/25/2013 02:09 PM   Modules accepted: Orders

## 2013-04-26 LAB — URINALYSIS, ROUTINE W REFLEX MICROSCOPIC
Bilirubin, UA: NEGATIVE
Protein, UA: NEGATIVE
RBC, UA: NEGATIVE
Urobilinogen, Ur: 1 mg/dL (ref 0.0–1.9)
pH, UA: 7 (ref 5.0–7.5)

## 2013-04-26 LAB — MICROSCOPIC EXAMINATION: Epithelial Cells (non renal): >10 /hpf — AB (ref 0–10)

## 2013-04-27 NOTE — Telephone Encounter (Signed)
Pt aware of results 

## 2013-04-27 NOTE — Telephone Encounter (Signed)
PLEASE CALL PT. SHE DOES NOT HAVE C DIFF. HER URINE DOES NOT SHOW A UTI. SHE LIKELY HAS A FOOD BORNE ILLNESS OR A VIRAL GASTROENTERITIS. SHE SHOULD DRINK WATER TO KEEP HER URINE LIGHT YELLOW. AVOID DAIRY PRODUCTS FOR 7 DAYS. USE IMODIUM AS NEEDED FOR DIARRHEA. OPV IN 1-2 WEEKS E30 DIARRHEA/ABD PAIN.

## 2013-05-01 NOTE — Telephone Encounter (Signed)
Pt is aware of OV on 10/8 at 1030 with SF

## 2013-05-03 ENCOUNTER — Telehealth: Payer: Self-pay | Admitting: Nurse Practitioner

## 2013-05-03 ENCOUNTER — Ambulatory Visit: Payer: Self-pay | Admitting: Nurse Practitioner

## 2013-05-03 NOTE — Telephone Encounter (Signed)
Pt needed to reschedule appt appt scheduled

## 2013-05-10 ENCOUNTER — Other Ambulatory Visit: Payer: Self-pay | Admitting: Cardiovascular Disease

## 2013-05-10 ENCOUNTER — Other Ambulatory Visit: Payer: Self-pay | Admitting: Family Medicine

## 2013-05-11 ENCOUNTER — Encounter: Payer: Self-pay | Admitting: Nurse Practitioner

## 2013-05-11 ENCOUNTER — Ambulatory Visit (INDEPENDENT_AMBULATORY_CARE_PROVIDER_SITE_OTHER): Payer: Medicare Other | Admitting: Nurse Practitioner

## 2013-05-11 VITALS — BP 109/64 | HR 69 | Temp 98.0°F | Ht 63.0 in | Wt 151.0 lb

## 2013-05-11 DIAGNOSIS — E1149 Type 2 diabetes mellitus with other diabetic neurological complication: Secondary | ICD-10-CM | POA: Diagnosis not present

## 2013-05-11 DIAGNOSIS — Z23 Encounter for immunization: Secondary | ICD-10-CM | POA: Diagnosis not present

## 2013-05-11 DIAGNOSIS — E119 Type 2 diabetes mellitus without complications: Secondary | ICD-10-CM

## 2013-05-11 DIAGNOSIS — F411 Generalized anxiety disorder: Secondary | ICD-10-CM

## 2013-05-11 DIAGNOSIS — E162 Hypoglycemia, unspecified: Secondary | ICD-10-CM

## 2013-05-11 DIAGNOSIS — I959 Hypotension, unspecified: Secondary | ICD-10-CM

## 2013-05-11 DIAGNOSIS — I2510 Atherosclerotic heart disease of native coronary artery without angina pectoris: Secondary | ICD-10-CM | POA: Diagnosis not present

## 2013-05-11 DIAGNOSIS — J449 Chronic obstructive pulmonary disease, unspecified: Secondary | ICD-10-CM

## 2013-05-11 DIAGNOSIS — K5792 Diverticulitis of intestine, part unspecified, without perforation or abscess without bleeding: Secondary | ICD-10-CM

## 2013-05-11 DIAGNOSIS — I509 Heart failure, unspecified: Secondary | ICD-10-CM | POA: Diagnosis not present

## 2013-05-11 DIAGNOSIS — E559 Vitamin D deficiency, unspecified: Secondary | ICD-10-CM

## 2013-05-11 DIAGNOSIS — Z950 Presence of cardiac pacemaker: Secondary | ICD-10-CM

## 2013-05-11 DIAGNOSIS — F419 Anxiety disorder, unspecified: Secondary | ICD-10-CM

## 2013-05-11 DIAGNOSIS — D649 Anemia, unspecified: Secondary | ICD-10-CM

## 2013-05-11 DIAGNOSIS — I4891 Unspecified atrial fibrillation: Secondary | ICD-10-CM

## 2013-05-11 DIAGNOSIS — B351 Tinea unguium: Secondary | ICD-10-CM | POA: Diagnosis not present

## 2013-05-11 DIAGNOSIS — J4489 Other specified chronic obstructive pulmonary disease: Secondary | ICD-10-CM

## 2013-05-11 DIAGNOSIS — I10 Essential (primary) hypertension: Secondary | ICD-10-CM | POA: Diagnosis not present

## 2013-05-11 DIAGNOSIS — L851 Acquired keratosis [keratoderma] palmaris et plantaris: Secondary | ICD-10-CM | POA: Diagnosis not present

## 2013-05-11 DIAGNOSIS — I495 Sick sinus syndrome: Secondary | ICD-10-CM

## 2013-05-11 DIAGNOSIS — K5732 Diverticulitis of large intestine without perforation or abscess without bleeding: Secondary | ICD-10-CM

## 2013-05-11 LAB — POCT UA - MICROALBUMIN: Microalbumin Ur, POC: 20 mg/L

## 2013-05-11 MED ORDER — FUROSEMIDE 40 MG PO TABS: 20.0000 mg | ORAL_TABLET | Freq: Every day | ORAL | Status: DC

## 2013-05-11 NOTE — Patient Instructions (Signed)

## 2013-05-11 NOTE — Addendum Note (Signed)
Addended by: Earlene Plater on: 05/11/2013 01:14 PM   Modules accepted: Orders

## 2013-05-11 NOTE — Progress Notes (Signed)
Subjective:    Patient ID: [name], female    DOB: 29-May-1935, 78 y.o.   MRN: [id number]  HPI  Patient here today for routine follow- up of multiple medical problems- She says she is doing really well- hasn't been to hospital since February.- No complaints today other than flare bil knee arthritis. Patient Active Problem List   Diagnosis Date Noted  . Vitamin D deficiency   . Peripheral edema 07/21/2012  . Carotid artery disease   . Tobacco abuse 05/12/2012  . Hypotension 05/11/2012  . Hypoglycemia 05/11/2012  . Diverticulitis 04/14/2012  . Diarrhea 11/04/2011  . Dysuria 10/21/2011  . Abdominal wall hernia 10/19/2011  . Dilated bile duct 10/19/2011  . Constipation 09/29/2011  . Abnormal CT of the abdomen 09/29/2011  . Abdominal pain 09/29/2011  . Weight loss 09/29/2011  . CAD (coronary artery disease)   . Abnormal CT of the chest   . Pacemaker-St.Jude   . Tachycardia-bradycardia syndrome 12/03/2010  . Chronic anticoagulation 12/03/2010  . Anxiety 12/03/2010  . Mitral stenosis 05/30/2010  . ANEMIA 05/16/2010  . DM 01/19/2009  . HYPERTENSION 01/19/2009  . Atrial fibrillation 01/19/2009  . CHF 01/19/2009  . COPD 01/19/2009  . HEART MURMUR, BENIGN 01/19/2009   Outpatient Encounter Prescriptions as of 05/11/2013  Medication Sig Dispense Refill  . ADVAIR DISKUS 250-50 MCG/DOSE AEPB INHALE ONE PUFF BY MOUTH TWICE DAILY  60 each  5  . clonazePAM (KLONOPIN) 0.5 MG tablet Take 1 tablet (0.5 mg total) by mouth 3 (three) times daily as needed.  90 tablet  1  . diltiazem (CARDIZEM CD) 120 MG 24 hr capsule Take 1 capsule (120 mg total) by mouth daily.  30 capsule  11  . furosemide (LASIX) 40 MG tablet Take 20 mg by mouth daily.       [name] glyBURIDE (DIABETA) 5 MG tablet TAKE ONE TABLET BY MOUTH ONE TIME DAILY  30 tablet  2  . HYDROcodone-acetaminophen (NORCO/VICODIN) 5-325 MG per tablet Take 1 tablet by mouth 3 (three) times daily as needed. For pain  90 tablet  0  . metoprolol  tartrate (LOPRESSOR) 25 MG tablet Take 1 tablet (25 mg total) by mouth 2 (two) times daily.  60 tablet  11  . metoprolol tartrate (LOPRESSOR) 25 MG tablet TAKE ONE TABLET BY MOUTH TWICE DAILY  60 tablet  6  . omeprazole (PRILOSEC) 40 MG capsule TAKE ONE CAPSULE BY MOUTH ONE TIME DAILY  30 capsule  2  . polyethylene glycol (MIRALAX / GLYCOLAX) packet Take 17 g by mouth daily as needed.      [name] PROAIR HFA 108 (90 BASE) MCG/ACT inhaler USE 2 PUFFS BY MOUTH EVERY 3 TO 4 HOURS WHILE AWAKE AS NEEDED  9 g  3  . simvastatin (ZOCOR) 10 MG tablet TAKE ONE TABLET BY MOUTH ONE TIME DAILY  30 tablet  5  . tiotropium (SPIRIVA) 18 MCG inhalation capsule Place 1 capsule (18 mcg total) into inhaler and inhale daily.  30 capsule  5  . warfarin (COUMADIN) 2 MG tablet Take 1-2 mg by mouth daily. *Take one-half tablet (1mg ) every day in the evening, except take one tablet on Wednesdays only*      . zafirlukast (ACCOLATE) 20 MG tablet TAKE ONE TABLET BY MOUTH TWICE DAILY  60 tablet  5   No facility-administered encounter medications on file as of 05/11/2013.       Review of Systems  Constitutional: Negative.   HENT: Negative.   Eyes: Negative.   Respiratory:  Negative for cough, choking, chest tightness and shortness of breath.   Cardiovascular: Negative for chest pain, palpitations and leg swelling.  Gastrointestinal: Negative.   Genitourinary: Negative.   Musculoskeletal: Arthralgias: bil knees.  Neurological: Negative.   Hematological: Negative.   Psychiatric/Behavioral: Negative.        Objective:   Physical Exam  Constitutional: She is oriented to person, place, and time. She appears well-developed and well-nourished.  HENT:  Nose: Nose normal.  Mouth/Throat: Oropharynx is clear and moist.  Eyes: EOM are normal.  Neck: Trachea normal, normal range of motion and full passive range of motion without pain. Neck supple. No JVD present. Carotid bruit is not present. No thyromegaly present.   Cardiovascular: Normal rate, regular rhythm and intact distal pulses.  Exam reveals no gallop and no friction rub.   Murmur (1/2 systolic) heard. Pulmonary/Chest: Effort normal and breath sounds normal.  Abdominal: Soft. Bowel sounds are normal. She exhibits no distension and no mass. There is no tenderness.  Musculoskeletal: Normal range of motion.  Lymphadenopathy:    She has no cervical adenopathy.  Neurological: She is alert and oriented to person, place, and time. She has normal reflexes.  Skin: Skin is warm and dry.  Brownish circulatory discoloration bil lower ext.  Psychiatric: She has a normal mood and affect. Her behavior is normal. Judgment and thought content normal.   BP 109/64  Pulse 69  Temp(Src) 98 F (36.7 C) (Oral)  Ht 5\' 3"  (1.6 m)  Wt 151 lb (68.493 kg)  BMI 26.76 kg/m2 Results for orders placed in visit on 05/11/13  POCT GLYCOSYLATED HEMOGLOBIN (HGB A1C)      Result Value Range   Hemoglobin A1C 5.2            Assessment & Plan:   1. HYPERTENSION   2. DM   3. CHF   4. Anxiety   5. CAD (coronary artery disease)   6. Vitamin D deficiency   7. Tachycardia-bradycardia syndrome   8. Pacemaker-St.Jude   9. Hypotension   10. Hypoglycemia   11. Diverticulitis   12. COPD   32. Atrial fibrillation   14. ANEMIA    Orders Placed This Encounter  Procedures  . CMP14+EGFR  . NMR, lipoprofile  . POCT glycosylated hemoglobin (Hb A1C)   Meds ordered this encounter  Medications  . furosemide (LASIX) 40 MG tablet    Sig: Take 0.5 tablets (20 mg total) by mouth daily.    Dispense:  30 tablet    Refill:  5    Order Specific Question:  Supervising Provider    Answer:  Joycelyn Man    Continue all meds Labs pending Diet and exercise encouraged Health maintenance reviewed Follow up in 3 months   [place], FNP

## 2013-05-12 LAB — MICROALBUMIN, URINE: Microalbumin, Urine: <3 ug/mL (ref 0.0–17.0)

## 2013-05-13 LAB — CMP14+EGFR
ALT: 10 IU/L (ref 0–32)
Alkaline Phosphatase: 89 IU/L (ref 39–117)
BUN/Creatinine Ratio: 24 (ref 11–26)
Chloride: 102 mmol/L (ref 97–108)
GFR calc Af Amer: 46 mL/min/{1.73_m2} — ABNORMAL LOW (ref >59)
Glucose: 110 mg/dL — ABNORMAL HIGH (ref 65–99)
Potassium: 4.1 mmol/L (ref 3.5–5.2)
Total Bilirubin: 0.3 mg/dL (ref 0.0–1.2)

## 2013-05-13 LAB — NMR, LIPOPROFILE
HDL Cholesterol by NMR: 27 mg/dL — ABNORMAL LOW (ref >=40)
LDL Particle Number: 1427 nmol/L — ABNORMAL HIGH (ref <1000)
LDL Size: 20.1 nm — ABNORMAL LOW (ref >20.5)
LP-IR Score: 55 — ABNORMAL HIGH (ref <=45)

## 2013-05-15 ENCOUNTER — Ambulatory Visit (INDEPENDENT_AMBULATORY_CARE_PROVIDER_SITE_OTHER): Payer: Medicare Other | Admitting: Pharmacist

## 2013-05-15 DIAGNOSIS — I4891 Unspecified atrial fibrillation: Secondary | ICD-10-CM

## 2013-05-15 LAB — POCT INR: INR: 3.1

## 2013-05-15 NOTE — Patient Instructions (Signed)
Anticoagulation Dose Instructions as of 05/15/2013     [name] Tue Wed Thu Fri Sat   New Dose 1 mg 2 mg 1 mg 2 mg 1 mg 2 mg 1 mg    Description       No warfarin today, then continue current dose of 1/2 tablet on sundays, tuesdays, thursdays and saturdays and 1 tablet on mondays, wednesdays and fridays.       INR was 3.1 today

## 2013-05-17 ENCOUNTER — Ambulatory Visit: Payer: Medicare Other | Admitting: Gastroenterology

## 2013-05-22 ENCOUNTER — Ambulatory Visit (INDEPENDENT_AMBULATORY_CARE_PROVIDER_SITE_OTHER): Payer: Medicare Other | Admitting: *Deleted

## 2013-05-22 ENCOUNTER — Other Ambulatory Visit: Payer: Self-pay | Admitting: Nurse Practitioner

## 2013-05-22 DIAGNOSIS — Z950 Presence of cardiac pacemaker: Secondary | ICD-10-CM

## 2013-05-22 DIAGNOSIS — I495 Sick sinus syndrome: Secondary | ICD-10-CM

## 2013-05-23 MED ORDER — HYDROCODONE-ACETAMINOPHEN 5-325 MG PO TABS: 1.0000 | ORAL_TABLET | Freq: Three times a day (TID) | ORAL | Status: DC | PRN

## 2013-05-23 NOTE — Telephone Encounter (Signed)
Last seen 05/11/13, last filled 04/21/13. Rx will print have nurse call her to pickup

## 2013-05-23 NOTE — Telephone Encounter (Signed)
Up front to pick up

## 2013-05-23 NOTE — Telephone Encounter (Signed)
rx ready for pickup 

## 2013-05-24 ENCOUNTER — Other Ambulatory Visit: Payer: Self-pay | Admitting: Nurse Practitioner

## 2013-05-24 LAB — REMOTE PACEMAKER DEVICE
BRDY-0002RV: 60 {beats}/min
BRDY-0004RV: 120 {beats}/min
DEVICE MODEL PM: 7199163
RV LEAD AMPLITUDE: 12 mv
RV LEAD IMPEDENCE PM: 840 Ohm

## 2013-05-25 ENCOUNTER — Ambulatory Visit: Payer: Medicare Other | Admitting: Family Medicine

## 2013-05-26 ENCOUNTER — Emergency Department (HOSPITAL_COMMUNITY)
Admission: EM | Admit: 2013-05-26 | Discharge: 2013-05-26 | Disposition: A | Discharge status: Home / Self Care | Payer: Medicare Other | Attending: Emergency Medicine | Admitting: Emergency Medicine

## 2013-05-26 ENCOUNTER — Ambulatory Visit (INDEPENDENT_AMBULATORY_CARE_PROVIDER_SITE_OTHER): Payer: Medicare Other | Admitting: Family Medicine

## 2013-05-26 ENCOUNTER — Encounter: Payer: Self-pay | Admitting: Family Medicine

## 2013-05-26 ENCOUNTER — Encounter (HOSPITAL_COMMUNITY): Payer: Self-pay | Admitting: Emergency Medicine

## 2013-05-26 VITALS — BP 110/52 | HR 56 | Temp 98.6°F | Ht 63.0 in | Wt 154.0 lb

## 2013-05-26 DIAGNOSIS — J4489 Other specified chronic obstructive pulmonary disease: Secondary | ICD-10-CM | POA: Insufficient documentation

## 2013-05-26 DIAGNOSIS — Z79899 Other long term (current) drug therapy: Secondary | ICD-10-CM | POA: Diagnosis not present

## 2013-05-26 DIAGNOSIS — J449 Chronic obstructive pulmonary disease, unspecified: Secondary | ICD-10-CM | POA: Insufficient documentation

## 2013-05-26 DIAGNOSIS — Z862 Personal history of diseases of the blood and blood-forming organs and certain disorders involving the immune mechanism: Secondary | ICD-10-CM | POA: Insufficient documentation

## 2013-05-26 DIAGNOSIS — L0390 Cellulitis, unspecified: Secondary | ICD-10-CM

## 2013-05-26 DIAGNOSIS — E119 Type 2 diabetes mellitus without complications: Secondary | ICD-10-CM | POA: Diagnosis not present

## 2013-05-26 DIAGNOSIS — Z950 Presence of cardiac pacemaker: Secondary | ICD-10-CM | POA: Insufficient documentation

## 2013-05-26 DIAGNOSIS — Z8639 Personal history of other endocrine, nutritional and metabolic disease: Secondary | ICD-10-CM | POA: Insufficient documentation

## 2013-05-26 DIAGNOSIS — N182 Chronic kidney disease, stage 2 (mild): Secondary | ICD-10-CM | POA: Insufficient documentation

## 2013-05-26 DIAGNOSIS — F17200 Nicotine dependence, unspecified, uncomplicated: Secondary | ICD-10-CM | POA: Insufficient documentation

## 2013-05-26 DIAGNOSIS — E7800 Pure hypercholesterolemia, unspecified: Secondary | ICD-10-CM | POA: Diagnosis not present

## 2013-05-26 DIAGNOSIS — I4891 Unspecified atrial fibrillation: Secondary | ICD-10-CM | POA: Diagnosis not present

## 2013-05-26 DIAGNOSIS — Z87440 Personal history of urinary (tract) infections: Secondary | ICD-10-CM | POA: Insufficient documentation

## 2013-05-26 DIAGNOSIS — Z9861 Coronary angioplasty status: Secondary | ICD-10-CM | POA: Insufficient documentation

## 2013-05-26 DIAGNOSIS — I129 Hypertensive chronic kidney disease with stage 1 through stage 4 chronic kidney disease, or unspecified chronic kidney disease: Secondary | ICD-10-CM | POA: Insufficient documentation

## 2013-05-26 DIAGNOSIS — Z7901 Long term (current) use of anticoagulants: Secondary | ICD-10-CM | POA: Diagnosis not present

## 2013-05-26 DIAGNOSIS — I509 Heart failure, unspecified: Secondary | ICD-10-CM | POA: Diagnosis not present

## 2013-05-26 DIAGNOSIS — G4730 Sleep apnea, unspecified: Secondary | ICD-10-CM | POA: Insufficient documentation

## 2013-05-26 DIAGNOSIS — L02419 Cutaneous abscess of limb, unspecified: Secondary | ICD-10-CM | POA: Diagnosis not present

## 2013-05-26 DIAGNOSIS — M79609 Pain in unspecified limb: Secondary | ICD-10-CM

## 2013-05-26 DIAGNOSIS — K219 Gastro-esophageal reflux disease without esophagitis: Secondary | ICD-10-CM | POA: Insufficient documentation

## 2013-05-26 DIAGNOSIS — I2510 Atherosclerotic heart disease of native coronary artery without angina pectoris: Secondary | ICD-10-CM | POA: Insufficient documentation

## 2013-05-26 LAB — CBC WITH DIFFERENTIAL/PLATELET
Basophils Absolute: 0 10*3/uL (ref 0.0–0.1)
Eosinophils Absolute: 0.2 10*3/uL (ref 0.0–0.7)
Eosinophils Relative: 2 % (ref 0–5)
Hemoglobin: 11.2 g/dL — ABNORMAL LOW (ref 12.0–15.0)
Lymphocytes Relative: 25 % (ref 12–46)
Lymphs Abs: 2.4 10*3/uL (ref 0.7–4.0)
MCH: 33.4 pg (ref 26.0–34.0)
MCV: 98.5 fL (ref 78.0–100.0)
Neutro Abs: 6.3 10*3/uL (ref 1.7–7.7)
Neutrophils Relative %: 66 % (ref 43–77)
Platelets: 202 10*3/uL (ref 150–400)
RBC: 3.35 MIL/uL — ABNORMAL LOW (ref 3.87–5.11)
RDW: 12.7 % (ref 11.5–15.5)
WBC: 9.6 10*3/uL (ref 4.0–10.5)

## 2013-05-26 LAB — COMPREHENSIVE METABOLIC PANEL
ALT: 10 U/L (ref 0–35)
AST: 19 U/L (ref 0–37)
Alkaline Phosphatase: 98 U/L (ref 39–117)
CO2: 27 mEq/L (ref 19–32)
Calcium: 10 mg/dL (ref 8.4–10.5)
GFR calc non Af Amer: 37 mL/min — ABNORMAL LOW (ref >90)
Glucose, Bld: 121 mg/dL — ABNORMAL HIGH (ref 70–99)
Potassium: 4.3 mEq/L (ref 3.5–5.1)
Sodium: 137 mEq/L (ref 135–145)
Total Protein: 7.2 g/dL (ref 6.0–8.3)

## 2013-05-26 LAB — D-DIMER, QUANTITATIVE: D-Dimer, Quant: 0.27 ug/mL-FEU (ref 0.00–0.48)

## 2013-05-26 MED ORDER — CLINDAMYCIN HCL 150 MG PO CAPS
300.0000 mg | ORAL_CAPSULE | Freq: Once | ORAL | Status: AC
  Administered 2013-05-26: 300 mg via ORAL
  Filled 2013-05-26: qty 2

## 2013-05-26 MED ORDER — CLINDAMYCIN HCL 300 MG PO CAPS: 300.0000 mg | ORAL_CAPSULE | Freq: Three times a day (TID) | ORAL | Status: AC

## 2013-05-26 NOTE — Progress Notes (Signed)
  Subjective:    Patient ID: [name], female    DOB: 27-Jun-1935, 78 y.o.   MRN: [id number]  HPI This 78 y.o. female presents for evaluation of right lower extremity edema and pain for the last few days. She is worried she may have a clot in her leg.  She has no hx of coagulopathy or DVT.   Review of Systems C/o right lower extremity edema, swelling, and pain No chest pain, SOB, HA, dizziness, vision change, N/V, diarrhea, constipation, dysuria, urinary urgency or frequency, myalgias, arthralgias or rash.     Objective:   Physical Exam  Vital signs noted  Well developed well nourished female.  HEENT - Head atraumatic Normocephalic Respiratory - Lungs CTA bilateral Cardiac - RRR S1 and S2 without murmur GI - Abdomen soft Nontender and bowel sounds active x 4 Extremities - Right lower extremity with pedal edema and positive homan's and pain With walking.  Patient limping      Assessment & Plan:  Extremity pain Recommend patient go to ED due to how late it is and unable to get appointment at radiology scheduled.  Lysbeth Penner FNP

## 2013-05-26 NOTE — ED Provider Notes (Signed)
CSN: [id number]     Arrival date & time 05/26/13  2004 History   First MD Initiated Contact with Patient 05/26/13 2018     This chart was scribed for Carmin Muskrat, MD by Era Bumpers, ED scribe. This patient was seen in room APA12/APA12 and the patient's care was started at 2004.  Chief Complaint  Patient presents with  . Leg Swelling  . DVT   The history is provided by the patient. No language interpreter was used.   HPI Comments: [name] is a 78 y.o. female who presents to the Emergency Department complaining of red, swollen, warm to touch, painful right ankle, onset 5 days ago, no injury or trauma to her ankle/leg. She states pain radiates up her right leg. She states the pain sometimes radiates up her right leg. She is diabetic and denies any previous similar episodes. She denies any new activities, shoes/socks, etc. She states the swelling improves at night. She smokes and does not consume alcohol.   Past Medical History  Diagnosis Date  . Permanent atrial fibrillation     H/o tachybradycardia syndrome on Coumadin anticoagulation, has pacemaker.  [name] CAD (coronary artery disease) 2011    Catheterization August 2011: mild nonobstructive coronary artery disease complicated by large left rectus sheath hematoma status post evacuation Coumadin restarted without recurrence  . Diabetes mellitus   . Chronic airway obstruction, not elsewhere classified   . Anemia, unspecified   . Chronic kidney disease, stage II (mild)   . Unspecified hypertensive kidney disease with chronic kidney disease stage I through stage IV, or unspecified(403.90)   . Tachycardia-bradycardia     s/p St. Jude single chamber PPM 10/2010   . GERD (gastroesophageal reflux disease)   . Abnormal CT of the chest     Mediastinal and hilar adenopathy followed by Dr. Koleen Nimrod in [place]  . Congestive heart failure, unspecified     EF now 60%, previous nonischemic cardiomyopathy  . Pacemaker     Single chamber [place].  Jude ACCENT [phone] SN [id number] 04/11/1959 10/31/2010  . Valvular heart disease     a. H/o moderate mitral stenosis by cath 2011 but no mention of this on 10/2011 echo. b. 10/2011 echo: mod MR, mild AS, mild TR; EF>65%  . Hypercholesterolemia   . Diverticulitis Dec 2012  . Body mass index (BMI) of 20.0-20.9 in adult FEB 2013 147 LBS  . Recurrent UTI   . Neurogenic sleep apnea     Uses noctural oxygen prescribed by her pulmonologi  . Rheumatic heart disease   . Gastritis     By EGD  . Carotid artery disease     Doppler, 05/2012, 0-39% bilateral  . Vitamin D deficiency    Past Surgical History  Procedure Laterality Date  . Single-chamber pacemaker implantation  3/12    by Dr Caryl Comes  . Evacuation of retroperitoneal and left rectus sheath    . Cholecystectomy      40+ years ago  . Hernia repair      X3  . Pilonidal cyst / sinus excision    . Esophagogastroduodenoscopy  10/2011    Fields-erosive gastritis, biopsy with no H. pylori, hiatal hernia, peptic duodenitis, no celiac disease, duodenal diverticulum, duodenal nodule  . Colonoscopy  10/2011    Dyann Ruddle sigmoid to descending diverticulosis, internal hemorrhoids  . Eus  11/16/11    Mishra-13 MM CBD, normal pancreatic duct, otherwise normal EUS   Family History  Problem Relation Age of Onset  . Cancer Mother  Uterus  . Cancer Daughter     kidney cancer, in remission  . Heart disease Father   . Colon cancer Neg Hx    History  Substance Use Topics  . Smoking status: Current Every Day Smoker -- 0.10 packs/day for 45 years    Types: Cigarettes    Last Attempt to Quit: 08/10/2006  . Smokeless tobacco: Never Used     Comment: quit 08-10-2006 and started smoking again 04-18-2012  . Alcohol Use: No   OB History   Grav Para Term Preterm Abortions TAB SAB Ect Mult Living                 Review of Systems  Constitutional: Negative for fever and chills.  Respiratory: Negative for shortness of breath.   Cardiovascular: Negative for  chest pain.  Gastrointestinal: Negative for nausea, vomiting and abdominal pain.  Musculoskeletal: Negative for back pain.       Right ankle swelling pain, warm to touch  Skin: Negative for color change.  Neurological: Negative for weakness.  All other systems reviewed and are negative.   A complete 10 system review of systems was obtained and all systems are negative except as noted in the HPI and PMH.   Allergies  Torsemide and Tramadol  Home Medications   Current Outpatient Rx  Name  Route  Sig  Dispense  Refill  . albuterol (PROVENTIL HFA;VENTOLIN HFA) 108 (90 BASE) MCG/ACT inhaler   Inhalation   Inhale 2 puffs into the lungs every 6 (six) hours as needed for wheezing.         . bisacodyl (DULCOLAX) 5 MG EC tablet   Oral   Take 5 mg by mouth daily as needed for constipation.         . clonazePAM (KLONOPIN) 0.5 MG tablet   Oral   Take 1 tablet (0.5 mg total) by mouth 3 (three) times daily as needed.   90 tablet   1   . diltiazem (CARDIZEM CD) 120 MG 24 hr capsule   Oral   Take 1 capsule (120 mg total) by mouth daily.   30 capsule   11   . Fluticasone-Salmeterol (ADVAIR) 250-50 MCG/DOSE AEPB   Inhalation   Inhale 1 puff into the lungs every 12 (twelve) hours.         . furosemide (LASIX) 40 MG tablet   Oral   Take 0.5 tablets (20 mg total) by mouth daily.   30 tablet   5   . glyBURIDE (DIABETA) 5 MG tablet   Oral   Take 5 mg by mouth daily with breakfast.         . HYDROcodone-acetaminophen (NORCO/VICODIN) 5-325 MG per tablet   Oral   Take 1 tablet by mouth 3 (three) times daily as needed. For pain   90 tablet   0   . metoprolol tartrate (LOPRESSOR) 25 MG tablet   Oral   Take 1 tablet (25 mg total) by mouth 2 (two) times daily.   60 tablet   11   . omeprazole (PRILOSEC) 40 MG capsule   Oral   Take 40 mg by mouth daily.         . simvastatin (ZOCOR) 10 MG tablet   Oral   Take 10 mg by mouth at bedtime.         [name] tiotropium (SPIRIVA)  18 MCG inhalation capsule   Inhalation   Place 1 capsule (18 mcg total) into inhaler and inhale daily.   [place]  capsule   5   . warfarin (COUMADIN) 2 MG tablet   Oral   Take 1-2 mg by mouth daily. *Take one-half tablet (1mg ) every day in the evening, except take one tablet on Wednesdays only*         . zafirlukast (ACCOLATE) 20 MG tablet   Oral   Take 20 mg by mouth 2 (two) times daily.          Triage Vitals: BP 123/79  Temp(Src) 99 F (37.2 C) (Oral)  Resp 18  Ht 5\' 3"  (1.6 m)  Wt 152 lb (68.947 kg)  BMI 26.93 kg/m2  SpO2 99% Physical Exam  Nursing note and vitals reviewed. Constitutional: She is oriented to person, place, and time. She appears well-developed and well-nourished. No distress.  HENT:  Head: Normocephalic and atraumatic.  Eyes: EOM are normal.  Neck: Neck supple. No tracheal deviation present.  Cardiovascular: Normal rate, regular rhythm and normal heart sounds.   No murmur heard. Pulmonary/Chest: Effort normal. No respiratory distress.  Musculoskeletal: Normal range of motion.  ROM and strength of right knee appropriate. Non tender right knee. ROM and strength of left knee are appropriate. Pain over both malleoli of right ankle and warm to touch.  Left ankle/knee are unremarkable.   Neurological: She is alert and oriented to person, place, and time.  Skin: Skin is warm and dry.  Psychiatric: She has a normal mood and affect. Her behavior is normal.    ED Course  Procedures (including critical care time)  COORDINATION OF CARE: At 835 PM Discussed treatment plan with patient which includes blood work, INR, D-dimer. Patient agrees.   Labs Review Labs Reviewed  CBC WITH DIFFERENTIAL  COMPREHENSIVE METABOLIC PANEL  PROTIME-INR  D-DIMER, QUANTITATIVE   Imaging Review No results found.  EKG Interpretation   None       MDM  No diagnosis found.  I personally performed the services described in this documentation, which was scribed in my  presence. The recorded information has been reviewed and is accurate.   This patient presents with several days of swelling about the right ankle.  The patient has no recall of precipitant, her lesion is most consistent with infection.  Patient is distally neurovascularly intact, has no systemic signs of infection.  Patient has minimal risk profile for DVT, and physical exam findings are much more consistent with infection.  Patient's d-dimer was negative, and she is therapeutically coagulated regardless.  Patient is appropriate for discharge with initiation of antibiotics.  Carmin Muskrat, MD 05/26/13 2219

## 2013-05-26 NOTE — Patient Instructions (Signed)
Edema Edema is an abnormal build-up of fluids in tissues. Because this is partly dependent on gravity (water flows to the lowest place), it is more common in the legs and thighs (lower extremities). It is also common in the looser tissues, like around the eyes. Painless swelling of the feet and ankles is common and increases as a person ages. It may affect both legs and may include the calves or even thighs. When squeezed, the fluid may move out of the affected area and may leave a dent for a few moments. CAUSES   Prolonged standing or sitting in one place for extended periods of time. Movement helps pump tissue fluid into the veins, and absence of movement prevents this, resulting in edema.  Varicose veins. The valves in the veins do not work as well as they should. This causes fluid to leak into the tissues.  Fluid and salt overload.  Injury, burn, or surgery to the leg, ankle, or foot, may damage veins and allow fluid to leak out.  Sunburn damages vessels. Leaky vessels allow fluid to go out into the sunburned tissues.  Allergies (from insect bites or stings, medications or chemicals) cause swelling by allowing vessels to become leaky.  Protein in the blood helps keep fluid in your vessels. Low protein, as in malnutrition, allows fluid to leak out.  Hormonal changes, including pregnancy and menstruation, cause fluid retention. This fluid may leak out of vessels and cause edema.  Medications that cause fluid retention. Examples are sex hormones, blood pressure medications, steroid treatment, or anti-depressants.  Some illnesses cause edema, especially heart failure, kidney disease, or liver disease.  Surgery that cuts veins or lymph nodes, such as surgery done for the heart or for breast cancer, may result in edema. DIAGNOSIS  Your caregiver is usually easily able to determine what is causing your swelling (edema) by simply asking what is wrong (getting a history) and examining you (doing  a physical). Sometimes x-rays, EKG (electrocardiogram or heart tracing), and blood work may be done to evaluate for underlying medical illness. TREATMENT  General treatment includes:  Leg elevation (or elevation of the affected body part).  Restriction of fluid intake.  Prevention of fluid overload.  Compression of the affected body part. Compression with elastic bandages or support stockings squeezes the tissues, preventing fluid from entering and forcing it back into the blood vessels.  Diuretics (also called water pills or fluid pills) pull fluid out of your body in the form of increased urination. These are effective in reducing the swelling, but can have side effects and must be used only under your caregiver's supervision. Diuretics are appropriate only for some types of edema. The specific treatment can be directed at any underlying causes discovered. Heart, liver, or kidney disease should be treated appropriately. HOME CARE INSTRUCTIONS   Elevate the legs (or affected body part) above the level of the heart, while lying down.  Avoid sitting or standing still for prolonged periods of time.  Avoid putting anything directly under the knees when lying down, and do not wear constricting clothing or garters on the upper legs.  Exercising the legs causes the fluid to work back into the veins and lymphatic channels. This may help the swelling go down.  The pressure applied by elastic bandages or support stockings can help reduce ankle swelling.  A low-salt diet may help reduce fluid retention and decrease the ankle swelling.  Take any medications exactly as prescribed. SEEK MEDICAL CARE IF:  Your edema is   A low-salt diet may help reduce fluid retention and decrease the ankle swelling.   Take any medications exactly as prescribed.  SEEK MEDICAL CARE IF:   Your edema is not responding to recommended treatments.  SEEK IMMEDIATE MEDICAL CARE IF:    You develop shortness of breath or chest pain.   You cannot breathe when you lay down; or if, while lying down, you have to get up and go to the window to get your breath.   You are having increasing  swelling without relief from treatment.   You develop a fever over 102 F (38.9 C).   You develop pain or redness in the areas that are swollen.   Tell your caregiver right away if you have gained 3 lb/1.4 kg in 1 day or 5 lb/2.3 kg in a week.  MAKE SURE YOU:    Understand these instructions.   Will watch your condition.   Will get help right away if you are not doing well or get worse.  Document Released: 07/27/2005 Document Revised: 01/26/2012 Document Reviewed: 03/14/2008  [employer] Patient Information 2014 [employer].

## 2013-05-26 NOTE — ED Notes (Signed)
Right leg has been swelling for 3 days and my foot is sore per pt. Was seen at [place] and they sent me over for an ultrasound per pt.

## 2013-05-31 ENCOUNTER — Encounter: Payer: Self-pay | Admitting: Internal Medicine

## 2013-05-31 ENCOUNTER — Other Ambulatory Visit: Payer: Self-pay

## 2013-05-31 MED ORDER — CLONAZEPAM 0.5 MG PO TABS: 0.5000 mg | ORAL_TABLET | Freq: Three times a day (TID) | ORAL | Status: DC | PRN

## 2013-05-31 NOTE — Telephone Encounter (Signed)
Last seen 05/26/13  [facility] [place]  If approved route to nurse to phone into [facility]

## 2013-06-01 ENCOUNTER — Ambulatory Visit (INDEPENDENT_AMBULATORY_CARE_PROVIDER_SITE_OTHER): Payer: Medicare Other | Admitting: Gastroenterology

## 2013-06-01 ENCOUNTER — Encounter: Payer: Self-pay | Admitting: Gastroenterology

## 2013-06-01 VITALS — BP 108/62 | HR 60 | Temp 97.4°F | Wt 153.2 lb

## 2013-06-01 DIAGNOSIS — R634 Abnormal weight loss: Secondary | ICD-10-CM

## 2013-06-01 DIAGNOSIS — K5900 Constipation, unspecified: Secondary | ICD-10-CM | POA: Diagnosis not present

## 2013-06-01 NOTE — Assessment & Plan Note (Signed)
RX W/ DULCOLAX AND PRUNE JUICE  DRINK WATER EAT FIBER DULCOLAX PRN. OPV IN 6 MOS

## 2013-06-01 NOTE — Telephone Encounter (Signed)
rx called to [facility]

## 2013-06-01 NOTE — Patient Instructions (Signed)
WATCH WHAT YOU EAT.  USE DULCOLAX AND PRUNE JUICE AS NEEDED.  DRINK WATER TO KEEP YOUR URINE LIGHT YELLOW.  FOLLOW A HIGH FIBER/DIABETIC DIET. SEE INFO BELOW ON A HIGH FIBER DIET.  FOLLOW UP IN 6 MOS. MERRY CHRISTMAS AND HAPPY NEW YEAR!  High-Fiber Diet A high-fiber diet changes your normal diet to include more whole grains, legumes, fruits, and vegetables. Changes in the diet involve replacing refined carbohydrates with unrefined foods. The calorie level of the diet is essentially unchanged. The Dietary Reference Intake (recommended amount) for adult males is 38 grams per day. For adult females, it is 25 grams per day. Pregnant and lactating women should consume 28 grams of fiber per day. Fiber is the intact part of a plant that is not broken down during digestion. Functional fiber is fiber that has been isolated from the plant to provide a beneficial effect in the body. PURPOSE  Increase stool bulk.   Ease and regulate bowel movements.   Lower cholesterol.  INDICATIONS THAT YOU NEED MORE FIBER  Constipation and hemorrhoids.   Uncomplicated diverticulosis (intestine condition) and irritable bowel syndrome.   Weight management.   As a protective measure against hardening of the arteries (atherosclerosis), diabetes, and cancer.   GUIDELINES FOR INCREASING FIBER IN THE DIET  Start adding fiber to the diet slowly. A gradual increase of about 5 more grams (2 slices of whole-wheat bread, 2 servings of most fruits or vegetables, or 1 bowl of high-fiber cereal) per day is best. Too rapid an increase in fiber may result in constipation, flatulence, and bloating.   Drink enough water and fluids to keep your urine clear or pale yellow. Water, juice, or caffeine-free drinks are recommended. Not drinking enough fluid may cause constipation.   Eat a variety of high-fiber foods rather than one type of fiber.   Try to increase your intake of fiber through using high-fiber foods rather than  fiber pills or supplements that contain small amounts of fiber.   The goal is to change the types of food eaten. Do not supplement your present diet with high-fiber foods, but replace foods in your present diet.  INCLUDE A VARIETY OF FIBER SOURCES  Replace refined and processed grains with whole grains, canned fruits with fresh fruits, and incorporate other fiber sources. White rice, white breads, and most bakery goods contain little or no fiber.   Brown whole-grain rice, buckwheat oats, and many fruits and vegetables are all good sources of fiber. These include: broccoli, Brussels sprouts, cabbage, cauliflower, beets, sweet potatoes, white potatoes (skin on), carrots, tomatoes, eggplant, squash, berries, fresh fruits, and dried fruits.   Cereals appear to be the richest source of fiber. Cereal fiber is found in whole grains and bran. Bran is the fiber-rich outer coat of cereal grain, which is largely removed in refining. In whole-grain cereals, the bran remains. In breakfast cereals, the largest amount of fiber is found in those with "bran" in their names. The fiber content is sometimes indicated on the label.   You may need to include additional fruits and vegetables each day.   In baking, for 1 cup white flour, you may use the following substitutions:   1 cup whole-wheat flour minus 2 tablespoons.   1/2 cup white flour plus 1/2 cup whole-wheat flour.

## 2013-06-01 NOTE — Assessment & Plan Note (Signed)
RESOLVED.  DIET MODIFICATION: BMI >25.

## 2013-06-01 NOTE — Progress Notes (Signed)
Subjective:    Patient ID: [name], female    DOB: 1935/03/01, 78 y.o.   MRN: [id number] Redge Gainer, MD  HPI WENT TO FOOT DOCTOR. LEG SWELLING. HAD CELLULITIS BUT THEY BOTHERED HER STOMACH. NO DIARRHEA JUST RUMBLING. SOMETIMES NEEDS PRUNE JUICE FOR CONSTIPATION. CHRONIC RLQ PAIN. NEEDS DULCOLAX/PRUNE JUICE TO HAVE A BM. GAINED 28 LBS SINCE OCT 2013. PT DENIES FEVER, CHILLS, BRBPR, nausea, vomiting, melena, diarrhea, abd pain, OR problems swallowing. RARE heartburn or indigestion.   Past Medical History  Diagnosis Date  . Permanent atrial fibrillation     H/o tachybradycardia syndrome on Coumadin anticoagulation, has pacemaker.  [name] CAD (coronary artery disease) 2011    Catheterization August 2011: mild nonobstructive coronary artery disease complicated by large left rectus sheath hematoma status post evacuation Coumadin restarted without recurrence  . Diabetes mellitus   . Chronic airway obstruction, not elsewhere classified   . Anemia, unspecified   . Chronic kidney disease, stage II (mild)   . Unspecified hypertensive kidney disease with chronic kidney disease stage I through stage IV, or unspecified(403.90)   . Tachycardia-bradycardia     s/p [employer] Jude single chamber PPM 10/2010   . GERD (gastroesophageal reflux disease)   . Abnormal CT of the chest     Mediastinal and hilar adenopathy followed by Dr. Koleen Nimrod in [place]  . Congestive heart failure, unspecified     EF now 60%, previous nonischemic cardiomyopathy  . Pacemaker     Single chamber [employer] ACCENT [phone] SN 719 04/11/1959 10/31/2010  . Valvular heart disease     a. H/o moderate mitral stenosis by cath 2011 but no mention of this on 10/2011 echo. b. 10/2011 echo: mod MR, mild AS, mild TR; EF>65%  . Hypercholesterolemia   . Diverticulitis Dec 2012  . Body mass index (BMI) of 20.0-20.9 in adult FEB 2013 147 LBS  . Recurrent UTI   . Neurogenic sleep apnea     Uses noctural oxygen prescribed by her pulmonologi  .  Rheumatic heart disease   . Gastritis     By EGD  . Carotid artery disease     Doppler, 05/2012, 0-39% bilateral  . Vitamin D deficiency    Past Surgical History  Procedure Laterality Date  . Single-chamber pacemaker implantation  3/12    by Dr Caryl Comes  . Evacuation of retroperitoneal and left rectus sheath    . Cholecystectomy      40+ years ago  . Hernia repair      X3  . Pilonidal cyst / sinus excision    . Esophagogastroduodenoscopy  10/2011    Tita Terhaar-erosive gastritis, biopsy with no H. pylori, hiatal hernia, peptic duodenitis, no celiac disease, duodenal diverticulum, duodenal nodule  . Colonoscopy  10/2011    Dyann Ruddle sigmoid to descending diverticulosis, internal hemorrhoids  . Eus  11/16/11    Mishra-13 MM CBD, normal pancreatic duct, otherwise normal EUS   Allergies  Allergen Reactions  . Torsemide Nausea And Vomiting  . Tramadol Nausea Only   Current Outpatient Prescriptions  Medication Sig Dispense Refill  . albuterol (PROVENTIL HFA;VENTOLIN HFA) 108 (90 BASE) MCG/ACT inhaler Inhale 2 puffs into the lungs every 6 (six) hours as needed for wheezing.      . bisacodyl (DULCOLAX) 5 MG EC tablet Take 5 mg by mouth daily as needed for constipation.    . clindamycin (CLEOCIN) 300 MG capsule Take 1 capsule (300 mg total) by mouth 3 (three) times daily. HAS 14 d. SUPPLY   . clonazePAM (KLONOPIN)  0.5 MG tablet Take 1 tablet (0.5 mg total) by mouth 3 (three) times daily as needed.    . diltiazem (CARDIZEM CD) 120 MG 24 hr capsule Take 1 capsule (120 mg total) by mouth daily.    . Fluticasone-Salmeterol (ADVAIR) 250-50 MCG/DOSE AEPB Inhale 1 puff into the lungs every 12 (twelve) hours.    . furosemide (LASIX) 40 MG tablet Take 0.5 tablets (20 mg total) by mouth daily.    [name] glyBURIDE (DIABETA) 5 MG tablet Take 5 mg by mouth daily with breakfast.    . HYDROcodone-acetaminophen (NORCO/VICODIN) 5-325 MG per tablet Take 1 tablet by mouth 3 (three) times daily as needed. For pain     . metoprolol tartrate (LOPRESSOR) 25 MG tablet Take 1 tablet (25 mg total) by mouth 2 (two) times daily.    [name] omeprazole (PRILOSEC) 40 MG capsule Take 40 mg by mouth daily.    . simvastatin (ZOCOR) 10 MG tablet Take 10 mg by mouth at bedtime.    [name] tiotropium (SPIRIVA) 18 MCG inhalation capsule Place 1 capsule (18 mcg total) into inhaler and inhale daily.    [name] warfarin (COUMADIN) 2 MG tablet Take 1-2 mg by mouth daily. *Take one-half tablet (1mg ) every day in the evening, except take one tablet on Wednesdays only*    . zafirlukast (ACCOLATE) 20 MG tablet Take 20 mg by mouth 2 (two) times daily.         Review of Systems     Objective:   Physical Exam  Vitals reviewed. Constitutional: She is oriented to person, place, and time. She appears well-nourished. No distress.  HENT:  Head: Normocephalic and atraumatic.  Mouth/Throat: Oropharynx is clear and moist. No oropharyngeal exudate.  Eyes: Pupils are equal, round, and reactive to light. No scleral icterus.  Neck: Normal range of motion. Neck supple.  Cardiovascular: Normal rate and normal heart sounds.   Irregular RHYTHM   Pulmonary/Chest: Effort normal and breath sounds normal. No respiratory distress.  Abdominal: Soft. Bowel sounds are normal. She exhibits no distension. There is tenderness.  MILD RLQ TTP  Musculoskeletal: She exhibits no edema.  Lymphadenopathy:    She has no cervical adenopathy.  Neurological: She is alert and oriented to person, place, and time.  NO FOCAL DEFICITS   Psychiatric: She has a normal mood and affect.          Assessment & Plan:

## 2013-06-05 NOTE — Progress Notes (Signed)
cc'd to pcp 

## 2013-06-06 NOTE — Progress Notes (Signed)
Reminder in Epic 

## 2013-06-12 ENCOUNTER — Ambulatory Visit (INDEPENDENT_AMBULATORY_CARE_PROVIDER_SITE_OTHER): Payer: Medicare Other | Admitting: Pharmacist

## 2013-06-12 DIAGNOSIS — I4891 Unspecified atrial fibrillation: Secondary | ICD-10-CM

## 2013-06-12 LAB — POCT INR: INR: 2.9

## 2013-06-12 NOTE — Patient Instructions (Signed)
Anticoagulation Dose Instructions as of 06/12/2013     [name] Tue Wed Thu Fri Sat   New Dose 1 mg 2 mg 1 mg 2 mg 1 mg 2 mg 1 mg    Description       Continue current dose of 1/2 tablet on sundays, tuesdays, thursdays and saturdays and 1 tablet on mondays, wednesdays and fridays.       INR was 2.9 today

## 2013-06-18 DIAGNOSIS — F17200 Nicotine dependence, unspecified, uncomplicated: Secondary | ICD-10-CM | POA: Diagnosis not present

## 2013-06-18 DIAGNOSIS — I050 Rheumatic mitral stenosis: Secondary | ICD-10-CM | POA: Diagnosis not present

## 2013-06-18 DIAGNOSIS — Z79899 Other long term (current) drug therapy: Secondary | ICD-10-CM | POA: Diagnosis not present

## 2013-06-18 DIAGNOSIS — N390 Urinary tract infection, site not specified: Secondary | ICD-10-CM | POA: Diagnosis not present

## 2013-06-18 DIAGNOSIS — E7800 Pure hypercholesterolemia, unspecified: Secondary | ICD-10-CM | POA: Diagnosis not present

## 2013-06-18 DIAGNOSIS — Z7901 Long term (current) use of anticoagulants: Secondary | ICD-10-CM | POA: Diagnosis not present

## 2013-06-18 DIAGNOSIS — R197 Diarrhea, unspecified: Secondary | ICD-10-CM | POA: Diagnosis not present

## 2013-06-18 DIAGNOSIS — IMO0002 Reserved for concepts with insufficient information to code with codable children: Secondary | ICD-10-CM | POA: Diagnosis not present

## 2013-06-18 DIAGNOSIS — I4891 Unspecified atrial fibrillation: Secondary | ICD-10-CM | POA: Diagnosis not present

## 2013-06-18 DIAGNOSIS — J449 Chronic obstructive pulmonary disease, unspecified: Secondary | ICD-10-CM | POA: Diagnosis not present

## 2013-06-18 DIAGNOSIS — E119 Type 2 diabetes mellitus without complications: Secondary | ICD-10-CM | POA: Diagnosis not present

## 2013-06-18 DIAGNOSIS — Z8249 Family history of ischemic heart disease and other diseases of the circulatory system: Secondary | ICD-10-CM | POA: Diagnosis not present

## 2013-06-19 ENCOUNTER — Encounter: Payer: Self-pay | Admitting: Internal Medicine

## 2013-06-21 ENCOUNTER — Telehealth: Payer: Self-pay | Admitting: Nurse Practitioner

## 2013-06-26 MED ORDER — HYDROCODONE-ACETAMINOPHEN 5-325 MG PO TABS: 1.0000 | ORAL_TABLET | Freq: Three times a day (TID) | ORAL | Status: DC | PRN

## 2013-06-26 NOTE — Telephone Encounter (Signed)
rx ready for pickup 

## 2013-06-26 NOTE — Telephone Encounter (Signed)
Refill

## 2013-06-27 ENCOUNTER — Inpatient Hospital Stay (HOSPITAL_COMMUNITY)
Admission: EM | Admit: 2013-06-27 | Discharge: 2013-06-29 | DRG: 190 | Disposition: A | Discharge status: Home Health | Payer: Medicare Other | Attending: Internal Medicine | Admitting: Internal Medicine

## 2013-06-27 ENCOUNTER — Ambulatory Visit (INDEPENDENT_AMBULATORY_CARE_PROVIDER_SITE_OTHER): Payer: Medicare Other | Admitting: Family Medicine

## 2013-06-27 ENCOUNTER — Encounter (HOSPITAL_COMMUNITY): Payer: Self-pay | Admitting: Emergency Medicine

## 2013-06-27 ENCOUNTER — Emergency Department (HOSPITAL_COMMUNITY): Payer: Medicare Other

## 2013-06-27 ENCOUNTER — Encounter: Payer: Self-pay | Admitting: Family Medicine

## 2013-06-27 VITALS — BP 93/60 | HR 63 | Temp 98.3°F | Ht 63.0 in | Wt 156.0 lb

## 2013-06-27 DIAGNOSIS — I2510 Atherosclerotic heart disease of native coronary artery without angina pectoris: Secondary | ICD-10-CM | POA: Diagnosis present

## 2013-06-27 DIAGNOSIS — I4892 Unspecified atrial flutter: Secondary | ICD-10-CM | POA: Diagnosis present

## 2013-06-27 DIAGNOSIS — J441 Chronic obstructive pulmonary disease with (acute) exacerbation: Principal | ICD-10-CM | POA: Diagnosis present

## 2013-06-27 DIAGNOSIS — I5032 Chronic diastolic (congestive) heart failure: Secondary | ICD-10-CM

## 2013-06-27 DIAGNOSIS — R5381 Other malaise: Secondary | ICD-10-CM | POA: Diagnosis not present

## 2013-06-27 DIAGNOSIS — I350 Nonrheumatic aortic (valve) stenosis: Secondary | ICD-10-CM | POA: Insufficient documentation

## 2013-06-27 DIAGNOSIS — E559 Vitamin D deficiency, unspecified: Secondary | ICD-10-CM | POA: Diagnosis present

## 2013-06-27 DIAGNOSIS — I5033 Acute on chronic diastolic (congestive) heart failure: Secondary | ICD-10-CM | POA: Diagnosis not present

## 2013-06-27 DIAGNOSIS — I129 Hypertensive chronic kidney disease with stage 1 through stage 4 chronic kidney disease, or unspecified chronic kidney disease: Secondary | ICD-10-CM | POA: Diagnosis present

## 2013-06-27 DIAGNOSIS — Z7901 Long term (current) use of anticoagulants: Secondary | ICD-10-CM

## 2013-06-27 DIAGNOSIS — Z8051 Family history of malignant neoplasm of kidney: Secondary | ICD-10-CM

## 2013-06-27 DIAGNOSIS — R0602 Shortness of breath: Secondary | ICD-10-CM | POA: Diagnosis not present

## 2013-06-27 DIAGNOSIS — D649 Anemia, unspecified: Secondary | ICD-10-CM | POA: Diagnosis present

## 2013-06-27 DIAGNOSIS — Z720 Tobacco use: Secondary | ICD-10-CM

## 2013-06-27 DIAGNOSIS — I4891 Unspecified atrial fibrillation: Secondary | ICD-10-CM | POA: Diagnosis not present

## 2013-06-27 DIAGNOSIS — E119 Type 2 diabetes mellitus without complications: Secondary | ICD-10-CM | POA: Diagnosis present

## 2013-06-27 DIAGNOSIS — Z950 Presence of cardiac pacemaker: Secondary | ICD-10-CM | POA: Diagnosis present

## 2013-06-27 DIAGNOSIS — I10 Essential (primary) hypertension: Secondary | ICD-10-CM

## 2013-06-27 DIAGNOSIS — G4730 Sleep apnea, unspecified: Secondary | ICD-10-CM | POA: Diagnosis present

## 2013-06-27 DIAGNOSIS — I495 Sick sinus syndrome: Secondary | ICD-10-CM | POA: Diagnosis not present

## 2013-06-27 DIAGNOSIS — I050 Rheumatic mitral stenosis: Secondary | ICD-10-CM | POA: Diagnosis present

## 2013-06-27 DIAGNOSIS — N390 Urinary tract infection, site not specified: Secondary | ICD-10-CM

## 2013-06-27 DIAGNOSIS — E1129 Type 2 diabetes mellitus with other diabetic kidney complication: Secondary | ICD-10-CM | POA: Diagnosis present

## 2013-06-27 DIAGNOSIS — Z8249 Family history of ischemic heart disease and other diseases of the circulatory system: Secondary | ICD-10-CM | POA: Diagnosis not present

## 2013-06-27 DIAGNOSIS — J9600 Acute respiratory failure, unspecified whether with hypoxia or hypercapnia: Secondary | ICD-10-CM | POA: Diagnosis present

## 2013-06-27 DIAGNOSIS — I428 Other cardiomyopathies: Secondary | ICD-10-CM | POA: Diagnosis present

## 2013-06-27 DIAGNOSIS — R0989 Other specified symptoms and signs involving the circulatory and respiratory systems: Secondary | ICD-10-CM | POA: Diagnosis not present

## 2013-06-27 DIAGNOSIS — E7800 Pure hypercholesterolemia, unspecified: Secondary | ICD-10-CM | POA: Diagnosis present

## 2013-06-27 DIAGNOSIS — R0789 Other chest pain: Secondary | ICD-10-CM

## 2013-06-27 DIAGNOSIS — Z79899 Other long term (current) drug therapy: Secondary | ICD-10-CM | POA: Diagnosis not present

## 2013-06-27 DIAGNOSIS — E785 Hyperlipidemia, unspecified: Secondary | ICD-10-CM | POA: Diagnosis present

## 2013-06-27 DIAGNOSIS — R0609 Other forms of dyspnea: Secondary | ICD-10-CM

## 2013-06-27 DIAGNOSIS — I359 Nonrheumatic aortic valve disorder, unspecified: Secondary | ICD-10-CM | POA: Diagnosis not present

## 2013-06-27 DIAGNOSIS — K219 Gastro-esophageal reflux disease without esophagitis: Secondary | ICD-10-CM | POA: Diagnosis present

## 2013-06-27 DIAGNOSIS — R0600 Dyspnea, unspecified: Secondary | ICD-10-CM

## 2013-06-27 DIAGNOSIS — N184 Chronic kidney disease, stage 4 (severe): Secondary | ICD-10-CM | POA: Diagnosis present

## 2013-06-27 DIAGNOSIS — Z5181 Encounter for therapeutic drug level monitoring: Secondary | ICD-10-CM

## 2013-06-27 DIAGNOSIS — F17200 Nicotine dependence, unspecified, uncomplicated: Secondary | ICD-10-CM | POA: Diagnosis not present

## 2013-06-27 DIAGNOSIS — I099 Rheumatic heart disease, unspecified: Secondary | ICD-10-CM | POA: Diagnosis present

## 2013-06-27 DIAGNOSIS — I509 Heart failure, unspecified: Secondary | ICD-10-CM | POA: Diagnosis not present

## 2013-06-27 DIAGNOSIS — I4821 Permanent atrial fibrillation: Secondary | ICD-10-CM | POA: Diagnosis present

## 2013-06-27 DIAGNOSIS — I340 Nonrheumatic mitral (valve) insufficiency: Secondary | ICD-10-CM | POA: Insufficient documentation

## 2013-06-27 DIAGNOSIS — J449 Chronic obstructive pulmonary disease, unspecified: Secondary | ICD-10-CM | POA: Diagnosis present

## 2013-06-27 DIAGNOSIS — J90 Pleural effusion, not elsewhere classified: Secondary | ICD-10-CM | POA: Diagnosis not present

## 2013-06-27 HISTORY — DX: Nonrheumatic mitral (valve) insufficiency: I34.0

## 2013-06-27 LAB — CBC WITH DIFFERENTIAL/PLATELET
Basophils Absolute: 0 10*3/uL (ref 0.0–0.1)
Basophils Relative: 0 % (ref 0–1)
Eosinophils Absolute: 0 10*3/uL (ref 0.0–0.7)
MCH: 32.9 pg (ref 26.0–34.0)
MCHC: 33 g/dL (ref 30.0–36.0)
Neutro Abs: 6.4 10*3/uL (ref 1.7–7.7)
Neutrophils Relative %: 83 % — ABNORMAL HIGH (ref 43–77)
Platelets: 171 10*3/uL (ref 150–400)
RDW: 13.2 % (ref 11.5–15.5)

## 2013-06-27 LAB — COMPREHENSIVE METABOLIC PANEL
BUN: 28 mg/dL — ABNORMAL HIGH (ref 6–23)
CO2: 25 mEq/L (ref 19–32)
Calcium: 9.3 mg/dL (ref 8.4–10.5)
Chloride: 101 mEq/L (ref 96–112)
Creatinine, Ser: 1.3 mg/dL — ABNORMAL HIGH (ref 0.50–1.10)
GFR calc Af Amer: 45 mL/min — ABNORMAL LOW (ref >90)
GFR calc non Af Amer: 39 mL/min — ABNORMAL LOW (ref >90)
Glucose, Bld: 123 mg/dL — ABNORMAL HIGH (ref 70–99)
Total Bilirubin: 0.7 mg/dL (ref 0.3–1.2)

## 2013-06-27 LAB — URINALYSIS, ROUTINE W REFLEX MICROSCOPIC
Glucose, UA: NEGATIVE mg/dL
Ketones, ur: NEGATIVE mg/dL
Protein, ur: NEGATIVE mg/dL
Specific Gravity, Urine: 1.009 (ref 1.005–1.030)
Urobilinogen, UA: 0.2 mg/dL (ref 0.0–1.0)

## 2013-06-27 LAB — URINE MICROSCOPIC-ADD ON

## 2013-06-27 LAB — POCT I-STAT TROPONIN I: Troponin i, poc: 0.01 ng/mL (ref 0.00–0.08)

## 2013-06-27 LAB — PROTIME-INR
INR: 2.02 — ABNORMAL HIGH (ref 0.00–1.49)
Prothrombin Time: 22.2 seconds — ABNORMAL HIGH (ref 11.6–15.2)

## 2013-06-27 LAB — GLUCOSE, CAPILLARY: Glucose-Capillary: 295 mg/dL — ABNORMAL HIGH (ref 70–99)

## 2013-06-27 LAB — TROPONIN I: Troponin I: <0.3 ng/mL (ref <0.30)

## 2013-06-27 MED ORDER — SIMVASTATIN 10 MG PO TABS
10.0000 mg | ORAL_TABLET | Freq: Every day | ORAL | Status: DC
  Administered 2013-06-27 – 2013-06-28 (×2): 10 mg via ORAL
  Filled 2013-06-27 (×3): qty 1

## 2013-06-27 MED ORDER — METOPROLOL TARTRATE 25 MG PO TABS
25.0000 mg | ORAL_TABLET | Freq: Two times a day (BID) | ORAL | Status: DC
  Administered 2013-06-27 – 2013-06-29 (×4): 25 mg via ORAL
  Filled 2013-06-27 (×5): qty 1

## 2013-06-27 MED ORDER — METHYLPREDNISOLONE SODIUM SUCC 125 MG IJ SOLR
80.0000 mg | Freq: Three times a day (TID) | INTRAMUSCULAR | Status: DC
  Administered 2013-06-27 – 2013-06-28 (×3): 80 mg via INTRAVENOUS
  Filled 2013-06-27 (×8): qty 1.28

## 2013-06-27 MED ORDER — ONDANSETRON HCL 4 MG/2ML IJ SOLN: 4.0000 mg | Freq: Four times a day (QID) | INTRAMUSCULAR | Status: DC | PRN

## 2013-06-27 MED ORDER — ALUM & MAG HYDROXIDE-SIMETH 200-200-20 MG/5ML PO SUSP: 30.0000 mL | Freq: Four times a day (QID) | ORAL | Status: DC | PRN

## 2013-06-27 MED ORDER — ONDANSETRON HCL 4 MG PO TABS: 4.0000 mg | ORAL_TABLET | Freq: Four times a day (QID) | ORAL | Status: DC | PRN

## 2013-06-27 MED ORDER — WARFARIN - PHARMACIST DOSING INPATIENT
Freq: Every day | Status: DC
  Administered 2013-06-28: 18:00:00

## 2013-06-27 MED ORDER — ASPIRIN 81 MG PO CHEW
324.0000 mg | CHEWABLE_TABLET | Freq: Once | ORAL | Status: AC
  Administered 2013-06-27: 22:00:00 324 mg via ORAL
  Filled 2013-06-27: qty 4

## 2013-06-27 MED ORDER — GUAIFENESIN-DM 100-10 MG/5ML PO SYRP: 5.0000 mL | ORAL_SOLUTION | ORAL | Status: DC | PRN

## 2013-06-27 MED ORDER — SODIUM CHLORIDE 0.9 % IV SOLN: 125.0000 mg | Freq: Once | INTRAVENOUS | Status: DC

## 2013-06-27 MED ORDER — ALBUTEROL SULFATE (5 MG/ML) 0.5% IN NEBU
5.0000 mg | INHALATION_SOLUTION | Freq: Once | RESPIRATORY_TRACT | Status: AC
  Administered 2013-06-27: 5 mg via RESPIRATORY_TRACT
  Filled 2013-06-27: qty 1

## 2013-06-27 MED ORDER — CLONAZEPAM 0.5 MG PO TABS
0.5000 mg | ORAL_TABLET | Freq: Three times a day (TID) | ORAL | Status: DC | PRN
  Administered 2013-06-29: 02:00:00 0.5 mg via ORAL
  Filled 2013-06-27: qty 1

## 2013-06-27 MED ORDER — IPRATROPIUM BROMIDE 0.02 % IN SOLN
0.5000 mg | Freq: Once | RESPIRATORY_TRACT | Status: AC
  Administered 2013-06-27: 0.5 mg via RESPIRATORY_TRACT
  Filled 2013-06-27: qty 2.5

## 2013-06-27 MED ORDER — DILTIAZEM HCL ER COATED BEADS 120 MG PO CP24
120.0000 mg | ORAL_CAPSULE | Freq: Every day | ORAL | Status: DC
  Filled 2013-06-27: qty 1

## 2013-06-27 MED ORDER — ACETAMINOPHEN 325 MG PO TABS: 650.0000 mg | ORAL_TABLET | Freq: Four times a day (QID) | ORAL | Status: DC | PRN

## 2013-06-27 MED ORDER — MONTELUKAST SODIUM 10 MG PO TABS
10.0000 mg | ORAL_TABLET | Freq: Every day | ORAL | Status: DC
  Administered 2013-06-28: 21:00:00 10 mg via ORAL
  Filled 2013-06-27 (×2): qty 1

## 2013-06-27 MED ORDER — WARFARIN SODIUM 1 MG PO TABS: 1.0000 mg | ORAL_TABLET | Freq: Every day | ORAL | Status: DC

## 2013-06-27 MED ORDER — FUROSEMIDE 10 MG/ML IJ SOLN
40.0000 mg | Freq: Two times a day (BID) | INTRAMUSCULAR | Status: DC
  Administered 2013-06-28 – 2013-06-29 (×3): 40 mg via INTRAVENOUS
  Filled 2013-06-27 (×5): qty 4

## 2013-06-27 MED ORDER — SODIUM CHLORIDE 0.9 % IJ SOLN
3.0000 mL | Freq: Two times a day (BID) | INTRAMUSCULAR | Status: DC
  Administered 2013-06-27 – 2013-06-29 (×4): 3 mL via INTRAVENOUS

## 2013-06-27 MED ORDER — PREDNISONE 50 MG PO TABS: ORAL_TABLET | ORAL | Status: DC

## 2013-06-27 MED ORDER — METHYLPREDNISOLONE SODIUM SUCC 125 MG IJ SOLR
125.0000 mg | Freq: Once | INTRAMUSCULAR | Status: AC
  Administered 2013-06-27: 125 mg via INTRAMUSCULAR

## 2013-06-27 MED ORDER — LEVOFLOXACIN 750 MG PO TABS: 750.0000 mg | ORAL_TABLET | Freq: Every day | ORAL | Status: DC

## 2013-06-27 MED ORDER — MORPHINE SULFATE 2 MG/ML IJ SOLN: 1.0000 mg | INTRAMUSCULAR | Status: DC | PRN

## 2013-06-27 MED ORDER — ALBUTEROL SULFATE (5 MG/ML) 0.5% IN NEBU
2.5000 mg | INHALATION_SOLUTION | RESPIRATORY_TRACT | Status: DC | PRN
  Filled 2013-06-27: qty 0.5

## 2013-06-27 MED ORDER — ALBUTEROL SULFATE (5 MG/ML) 0.5% IN NEBU
2.5000 mg | INHALATION_SOLUTION | Freq: Four times a day (QID) | RESPIRATORY_TRACT | Status: DC
  Administered 2013-06-27 – 2013-06-28 (×3): 2.5 mg via RESPIRATORY_TRACT
  Filled 2013-06-27 (×2): qty 0.5

## 2013-06-27 MED ORDER — LEVOFLOXACIN 750 MG PO TABS
750.0000 mg | ORAL_TABLET | Freq: Every day | ORAL | Status: DC
  Administered 2013-06-27: 22:00:00 750 mg via ORAL
  Filled 2013-06-27 (×2): qty 1

## 2013-06-27 MED ORDER — HEPARIN SODIUM (PORCINE) 5000 UNIT/ML IJ SOLN
5000.0000 [IU] | Freq: Three times a day (TID) | INTRAMUSCULAR | Status: DC
  Administered 2013-06-27 – 2013-06-28 (×2): 5000 [IU] via SUBCUTANEOUS
  Filled 2013-06-27 (×5): qty 1

## 2013-06-27 MED ORDER — ACETAMINOPHEN 650 MG RE SUPP: 650.0000 mg | Freq: Four times a day (QID) | RECTAL | Status: DC | PRN

## 2013-06-27 MED ORDER — GUAIFENESIN ER 600 MG PO TB12
1200.0000 mg | ORAL_TABLET | Freq: Two times a day (BID) | ORAL | Status: DC
  Administered 2013-06-27 – 2013-06-29 (×4): 1200 mg via ORAL
  Filled 2013-06-27 (×5): qty 2

## 2013-06-27 MED ORDER — FUROSEMIDE 10 MG/ML IJ SOLN
40.0000 mg | Freq: Once | INTRAMUSCULAR | Status: AC
  Administered 2013-06-27: 40 mg via INTRAVENOUS
  Filled 2013-06-27: qty 4

## 2013-06-27 MED ORDER — MOMETASONE FURO-FORMOTEROL FUM 100-5 MCG/ACT IN AERO
2.0000 | INHALATION_SPRAY | Freq: Two times a day (BID) | RESPIRATORY_TRACT | Status: DC
  Administered 2013-06-27 – 2013-06-28 (×2): 2 via RESPIRATORY_TRACT
  Filled 2013-06-27: qty 8.8

## 2013-06-27 MED ORDER — IPRATROPIUM BROMIDE 0.02 % IN SOLN
0.5000 mg | Freq: Four times a day (QID) | RESPIRATORY_TRACT | Status: DC
  Administered 2013-06-28 (×2): 0.5 mg via RESPIRATORY_TRACT
  Filled 2013-06-27 (×2): qty 2.5

## 2013-06-27 MED ORDER — PANTOPRAZOLE SODIUM 40 MG PO TBEC
40.0000 mg | DELAYED_RELEASE_TABLET | Freq: Every day | ORAL | Status: DC
  Administered 2013-06-28 – 2013-06-29 (×2): 40 mg via ORAL
  Filled 2013-06-27 (×2): qty 1

## 2013-06-27 MED ORDER — TIOTROPIUM BROMIDE MONOHYDRATE 18 MCG IN CAPS
18.0000 ug | ORAL_CAPSULE | Freq: Every day | RESPIRATORY_TRACT | Status: DC
  Administered 2013-06-28 – 2013-06-29 (×2): 18 ug via RESPIRATORY_TRACT
  Filled 2013-06-27: qty 5

## 2013-06-27 MED ORDER — HYDROCODONE-ACETAMINOPHEN 5-325 MG PO TABS
1.0000 | ORAL_TABLET | ORAL | Status: DC | PRN
  Administered 2013-06-28 (×2): 2 via ORAL
  Administered 2013-06-29: 1 via ORAL
  Filled 2013-06-27 (×2): qty 2
  Filled 2013-06-27: qty 1

## 2013-06-27 MED ORDER — WARFARIN SODIUM 1 MG PO TABS
1.0000 mg | ORAL_TABLET | Freq: Once | ORAL | Status: AC
  Administered 2013-06-27: 1 mg via ORAL
  Filled 2013-06-27: qty 1

## 2013-06-27 MED ORDER — ASPIRIN 81 MG PO CHEW: 324.0000 mg | CHEWABLE_TABLET | Freq: Once | ORAL | Status: DC

## 2013-06-27 MED ORDER — ALBUTEROL SULFATE HFA 108 (90 BASE) MCG/ACT IN AERS: 2.0000 | INHALATION_SPRAY | Freq: Four times a day (QID) | RESPIRATORY_TRACT | Status: DC | PRN

## 2013-06-27 NOTE — H&P (Signed)
[facility] History and Physical  [name] [id number] DOB: 08-28-1934 DOA: 06/27/2013  Referring physician: Dr. Wyvonnia Dusky PCP: Redge Gainer, MD  Specialists: [facility] cardiology  Chief Complaint: Shortness of breath  HPI: [name] is a 78 y.o. female with past medical history of COPD, atrial fibrillation and sick sinus syndrome status post pacemaker. Patient given to the hospital because of shortness of breath and some chest tightness. Patient said for the past 2 days she felt congested, she has cough with minimal sputum production, she's been wheezing and progressively getting short of breath. She called her primary care physician last night was particularly to urgent care in a.m. She was seen today in [place] urgent care, given Solu-Medrol and Levaquin. Her EKG showed ST wave changes so she was sent to the hospital for further evaluation. In the ED her chest x-ray showed mild pulmonary edema suggesting CHF, POC troponin is negative, BNP is 923. Her INR is 2.0.  Review of Systems:  Constitutional: negative for anorexia, fevers and sweats Eyes: negative for irritation, redness and visual disturbance Ears, nose, mouth, throat, and face: negative for earaches, epistaxis, nasal congestion and sore throat Respiratory: Has cough, dyspnea on exertion, sputum production and wheezing Cardiovascular: negative for chest pain, dyspnea, lower extremity edema, orthopnea, palpitations and syncope Gastrointestinal: negative for abdominal pain, constipation, diarrhea, melena, nausea and vomiting Genitourinary:negative for dysuria, frequency and hematuria Hematologic/lymphatic: negative for bleeding, easy bruising and lymphadenopathy Musculoskeletal:negative for arthralgias, muscle weakness and stiff joints Neurological: negative for coordination problems, gait problems, headaches and weakness Endocrine: negative for diabetic symptoms including polydipsia, polyuria and weight  loss Allergic/Immunologic: negative for anaphylaxis, hay fever and urticaria  Past Medical History  Diagnosis Date  . Permanent atrial fibrillation     H/o tachybradycardia syndrome on Coumadin anticoagulation, has pacemaker.  [name] CAD (coronary artery disease) 2011    Catheterization August 2011: mild nonobstructive coronary artery disease complicated by large left rectus sheath hematoma status post evacuation Coumadin restarted without recurrence  . Diabetes mellitus   . Chronic airway obstruction, not elsewhere classified   . Anemia, unspecified   . Chronic kidney disease, stage II (mild)   . Unspecified hypertensive kidney disease with chronic kidney disease stage I through stage IV, or unspecified(403.90)   . Tachycardia-bradycardia     s/p St. Jude single chamber PPM 10/2010   . GERD (gastroesophageal reflux disease)   . Abnormal CT of the chest     Mediastinal and hilar adenopathy followed by Dr. Koleen Nimrod in [place]  . Congestive heart failure, unspecified     EF now 60%, previous nonischemic cardiomyopathy  . Pacemaker     Single chamber [place]. Jude ACCENT [phone] SN [id number] 04/11/1959 10/31/2010  . Valvular heart disease     a. H/o moderate mitral stenosis by cath 2011 but no mention of this on 10/2011 echo. b. 10/2011 echo: mod MR, mild AS, mild TR; EF>65%  . Hypercholesterolemia   . Diverticulitis Dec 2012  . Body mass index (BMI) of 20.0-20.9 in adult FEB 2013 147 LBS  . Recurrent UTI   . Neurogenic sleep apnea     Uses noctural oxygen prescribed by her pulmonologi  . Rheumatic heart disease   . Gastritis     By EGD  . Carotid artery disease     Doppler, 05/2012, 0-39% bilateral  . Vitamin D deficiency   . Dilated bile duct 10/19/2011   Past Surgical History  Procedure Laterality Date  . Single-chamber pacemaker implantation  3/12  by Dr Caryl Comes  . Evacuation of retroperitoneal and left rectus sheath    . Cholecystectomy      40+ years ago  . Hernia repair      X3  .  Pilonidal cyst / sinus excision    . Esophagogastroduodenoscopy  10/2011    Fields-erosive gastritis, biopsy with no H. pylori, hiatal hernia, peptic duodenitis, no celiac disease, duodenal diverticulum, duodenal nodule  . Colonoscopy  10/2011    Dyann Ruddle sigmoid to descending diverticulosis, internal hemorrhoids  . Eus  11/16/11    Mishra-13 MM CBD, normal pancreatic duct, otherwise normal EUS   Social History:  reports that she has been smoking Cigarettes.  She has a 22.5 pack-year smoking history. She has never used smokeless tobacco. She reports that she does not drink alcohol or use illicit drugs.  Allergies  Allergen Reactions  . Torsemide Nausea And Vomiting  . Tramadol Nausea Only    Family History  Problem Relation Age of Onset  . Cancer Mother     Uterus  . Cancer Daughter     kidney cancer, in remission  . Heart disease Father   . Colon cancer Neg Hx    Prior to Admission medications   Medication Sig Start Date End Date Taking? Authorizing Provider  albuterol (PROVENTIL HFA;VENTOLIN HFA) 108 (90 BASE) MCG/ACT inhaler Inhale 2 puffs into the lungs every 6 (six) hours as needed for wheezing.   Yes Historical Provider, MD  bisacodyl (DULCOLAX) 5 MG EC tablet Take 5 mg by mouth daily as needed for constipation.   Yes Historical Provider, MD  clonazePAM (KLONOPIN) 0.5 MG tablet Take 1 tablet (0.5 mg total) by mouth 3 (three) times daily as needed. 05/31/13 02/16/15 Yes Lysbeth Penner, FNP  diltiazem (CARDIZEM CD) 120 MG 24 hr capsule Take 1 capsule (120 mg total) by mouth daily. 04/18/13  Yes Herminio Commons, MD  Fluticasone-Salmeterol (ADVAIR) 250-50 MCG/DOSE AEPB Inhale 1 puff into the lungs every 12 (twelve) hours.   Yes Historical Provider, MD  furosemide (LASIX) 40 MG tablet Take 0.5 tablets (20 mg total) by mouth daily. 05/11/13  Yes [name], FNP  glyBURIDE (DIABETA) 5 MG tablet Take 2.5 mg by mouth daily with breakfast.    Yes Historical Provider, MD   metoprolol tartrate (LOPRESSOR) 25 MG tablet Take 1 tablet (25 mg total) by mouth 2 (two) times daily. 04/18/13  Yes Herminio Commons, MD  omeprazole (PRILOSEC) 40 MG capsule Take 40 mg by mouth daily.   Yes Historical Provider, MD  predniSONE (DELTASONE) 50 MG tablet 1 tab daily x 7 days 06/27/13  Yes Shanda Howells, MD  simvastatin (ZOCOR) 10 MG tablet Take 10 mg by mouth at bedtime.   Yes Historical Provider, MD  tiotropium (SPIRIVA) 18 MCG inhalation capsule Place 1 capsule (18 mcg total) into inhaler and inhale daily. 01/27/13  Yes Chipper Herb, MD  warfarin (COUMADIN) 2 MG tablet Take 1-2 mg by mouth daily. Takes 2 mg on Monday, Wednesday and Friday and 1 mg all other days   Yes Historical Provider, MD  zafirlukast (ACCOLATE) 20 MG tablet Take 20 mg by mouth 2 (two) times daily.   Yes Historical Provider, MD  HYDROcodone-acetaminophen (NORCO/VICODIN) 5-325 MG per tablet Take 1 tablet by mouth 3 (three) times daily as needed. For pain 06/26/13   [name], FNP  levofloxacin (LEVAQUIN) 750 MG tablet Take 1 tablet (750 mg total) by mouth daily. 06/27/13   Shanda Howells, MD   Physical Exam: Danley Danker  Vitals:   06/27/13 1631  BP: 110/68  Pulse:   Temp:   Resp: 22  General appearance: alert, cooperative and no distress  Head: Normocephalic, without obvious abnormality, atraumatic  Eyes: conjunctivae/corneas clear. PERRL, EOM's intact. Fundi benign.  Nose: Nares normal. Septum midline. Mucosa normal. No drainage or sinus tenderness.  Throat: lips, mucosa, and tongue normal; teeth and gums normal  Neck: Supple, no masses, no cervical lymphadenopathy, no JVD appreciated, no meningeal signs Resp: Expiratory wheezes Chest wall: no tenderness  Cardio: regular rate and rhythm, S1, S2 normal, no murmur, click, rub or gallop  GI: soft, non-tender; bowel sounds normal; no masses, no organomegaly  Extremities: extremities normal, atraumatic, no cyanosis or edema  Skin: Skin color, texture,  turgor normal. No rashes or lesions  Neurologic: Alert and oriented X 3, normal strength and tone. Normal symmetric reflexes. Normal coordination and gait  Labs on Admission:  Basic Metabolic Panel:  Recent Labs Lab 06/27/13 1551  NA 135  K 3.8  CL 101  CO2 25  GLUCOSE 123*  BUN 28*  CREATININE 1.30*  CALCIUM 9.3   Liver Function Tests:  Recent Labs Lab 06/27/13 1551  AST 19  ALT 10  ALKPHOS 87  BILITOT 0.7  PROT 7.3  ALBUMIN 3.4*   No results found for this basename: LIPASE, AMYLASE,  in the last 168 hours No results found for this basename: AMMONIA,  in the last 168 hours CBC:  Recent Labs Lab 06/27/13 1551  WBC 7.7  NEUTROABS 6.4  HGB 10.8*  HCT 32.7*  MCV 99.7  PLT 171   Cardiac Enzymes: No results found for this basename: CKTOTAL, CKMB, CKMBINDEX, TROPONINI,  in the last 168 hours  BNP (last 3 results)  Recent Labs  06/27/13 1551  PROBNP 923.5*   CBG: No results found for this basename: GLUCAP,  in the last 168 hours  Radiological Exams on Admission: Dg Chest 2 View  06/27/2013   CLINICAL DATA:  Difficulty breathing, congestion  EXAM: CHEST  2 VIEW  COMPARISON:  Chest radiograph 05/11/2012  FINDINGS: Left-sided pacemaker overlies stable cardiac silhouette. There is increase in perihilar airspace disease compared to prior. There is a new small left effusion. No focal consolidation. No pneumothorax. Degenerative osteophytosis of the thoracic spine.  IMPRESSION: Perihilar airspace disease suggests pulmonary edema. Small left effusion.   Electronically Signed   By: Suzy Bouchard M.D.   On: 06/27/2013 15:41    EKG: Independently reviewed.   Assessment/Plan Principal Problem:   COPD with acute exacerbation Active Problems:   Type II or unspecified type diabetes mellitus without mention of complication, not stated as uncontrolled   Atrial fibrillation   CHF   COPD   Tachycardia-bradycardia syndrome   Chronic anticoagulation   CAD (coronary  artery disease)   Pacemaker-St.Jude   Warfarin anticoagulation    Acute COPD exacerbation -Patient started on oral Levaquin in the urgent care, continue on that. -Started on systemic steroids, Levaquin, inhaled bronchodilators and oxygen. -Supportive management with antitussives and mucolytics.  Acute diastolic CHF -Chest x-ray showed evidence of pulmonary edema, high BNP. Last 2-D echo in 2013 showed preserved EF. -Patient started on Lasix 40 mg IV twice a day. Check 2-D echocardiogram, rule out ACS. -Monitor renal function and electrolytes closely. -Restricts fluids to only 1500 mL per day, check weight daily. -Cardiology consulted per  Atrial fibrillation/flutter -Patient is controlled with Cardizem and metoprolol. -12 EKG showed atrial flutter with 4:1 conduction, has short PR interval. -Is on Coumadin for  chronic anticoagulation, I have asked pharmacy to manage the dosage.  Type 2 diabetes mellitus -Seems like patient has very tight glycemic control. -Last A1c was on 05/11/2013 was 5.2 which correlate with mean plasma glucose of 107. -She is on glyburide, I held that. Patient started on carbohydrate modified diet.  Code Status: Full code Family Communication: *Plan discussed with the patient, no family at bedside.. Disposition Plan: Telemetry, inpatient, anticipate length of stay to be greater than 2 midnights  Time spent: 70 minutes  [facility] Pager [phone]  If 7PM-7AM, please contact night-coverage [email or web address] Password [facility] 06/27/2013, 6:14 PM

## 2013-06-27 NOTE — Progress Notes (Signed)
Patient has arrived to unit from ED, report received from ED nurse, report will be given to nurse coming on; will continue to monitor patient. Ruben Im RN

## 2013-06-27 NOTE — ED Provider Notes (Signed)
CSN: [id number]     Arrival date & time 06/27/13  1439 History   First MD Initiated Contact with Patient 06/27/13 1455     Chief Complaint  Patient presents with  . Chest Pain  . Shortness of Breath   (Consider location/radiation/quality/duration/timing/severity/associated sxs/prior Treatment) HPI Comments: Patient presents from urgent care with a one-week history of chest tightness, coughing, shortness of breath. She has a history of COPD and continues to smoke. She was treated at urgent care with aspirin, Solu-Medrol Levaquin and nebulizers. There is concern that she had T-wave inversions in her EKG. She denies ever having a heart attack. She has a history of atrial fibrillation, on Coumadin. She wears oxygen at night has not had either during the day. She states compliance with her spiriva and advair. she denies any leg pain or leg swelling. She denies any chest pain but states she has chest tightness with coughing.  The history is provided by the patient and the EMS personnel.    Past Medical History  Diagnosis Date  . Permanent atrial fibrillation     H/o tachybradycardia syndrome on Coumadin anticoagulation, has pacemaker.  [name] CAD (coronary artery disease) 2011    Catheterization August 2011: mild nonobstructive coronary artery disease complicated by large left rectus sheath hematoma status post evacuation Coumadin restarted without recurrence  . Diabetes mellitus   . Chronic airway obstruction, not elsewhere classified   . Anemia, unspecified   . Chronic kidney disease, stage II (mild)   . Unspecified hypertensive kidney disease with chronic kidney disease stage I through stage IV, or unspecified(403.90)   . Tachycardia-bradycardia     s/p St. Jude single chamber PPM 10/2010   . GERD (gastroesophageal reflux disease)   . Abnormal CT of the chest     Mediastinal and hilar adenopathy followed by Dr. Koleen Nimrod in [place]  . Congestive heart failure, unspecified     EF now 60%, previous  nonischemic cardiomyopathy  . Pacemaker     Single chamber [place]. Jude ACCENT [id number] SN 719 04/11/1959 10/31/2010  . Valvular heart disease     a. H/o moderate mitral stenosis by cath 2011 but no mention of this on 10/2011 echo. b. 10/2011 echo: mod MR, mild AS, mild TR; EF>65%  . Hypercholesterolemia   . Diverticulitis Dec 2012  . Body mass index (BMI) of 20.0-20.9 in adult FEB 2013 147 LBS  . Recurrent UTI   . Neurogenic sleep apnea     Uses noctural oxygen prescribed by her pulmonologi  . Rheumatic heart disease   . Gastritis     By EGD  . Carotid artery disease     Doppler, 05/2012, 0-39% bilateral  . Vitamin D deficiency   . Dilated bile duct 10/19/2011   Past Surgical History  Procedure Laterality Date  . Single-chamber pacemaker implantation  3/12    by Dr Caryl Comes  . Evacuation of retroperitoneal and left rectus sheath    . Cholecystectomy      40+ years ago  . Hernia repair      X3  . Pilonidal cyst / sinus excision    . Esophagogastroduodenoscopy  10/2011    Fields-erosive gastritis, biopsy with no H. pylori, hiatal hernia, peptic duodenitis, no celiac disease, duodenal diverticulum, duodenal nodule  . Colonoscopy  10/2011    Dyann Ruddle sigmoid to descending diverticulosis, internal hemorrhoids  . Eus  11/16/11    Mishra-13 MM CBD, normal pancreatic duct, otherwise normal EUS   Family History  Problem Relation Age of Onset  .  Cancer Mother     Uterus  . Cancer Daughter     kidney cancer, in remission  . Heart disease Father   . Colon cancer Neg Hx    History  Substance Use Topics  . Smoking status: Current Every Day Smoker -- 0.50 packs/day for 45 years    Types: Cigarettes    Last Attempt to Quit: 08/10/2006  . Smokeless tobacco: Never Used     Comment: quit 08-10-2006 and started smoking again 04-18-2012  . Alcohol Use: No   OB History   Grav Para Term Preterm Abortions TAB SAB Ect Mult Living                 Review of Systems  Constitutional: Negative for  fever, activity change and appetite change.  HENT: Positive for congestion and rhinorrhea.   Respiratory: Positive for shortness of breath.   Cardiovascular: Positive for chest pain. Negative for leg swelling.  Gastrointestinal: Negative for nausea, vomiting and abdominal pain.  Genitourinary: Negative for dysuria and hematuria.  Musculoskeletal: Negative for back pain.  Skin: Negative for rash.  Neurological: Negative for dizziness, weakness and headaches.  A complete 10 system review of systems was obtained and all systems are negative except as noted in the HPI and PMH.    Allergies  Torsemide and Tramadol  Home Medications   Current Outpatient Rx  Name  Route  Sig  Dispense  Refill  . albuterol (PROVENTIL HFA;VENTOLIN HFA) 108 (90 BASE) MCG/ACT inhaler   Inhalation   Inhale 2 puffs into the lungs every 6 (six) hours as needed for wheezing.         . bisacodyl (DULCOLAX) 5 MG EC tablet   Oral   Take 5 mg by mouth daily as needed for constipation.         . clonazePAM (KLONOPIN) 0.5 MG tablet   Oral   Take 1 tablet (0.5 mg total) by mouth 3 (three) times daily as needed.   90 tablet   0   . diltiazem (CARDIZEM CD) 120 MG 24 hr capsule   Oral   Take 1 capsule (120 mg total) by mouth daily.   30 capsule   11   . Fluticasone-Salmeterol (ADVAIR) 250-50 MCG/DOSE AEPB   Inhalation   Inhale 1 puff into the lungs every 12 (twelve) hours.         . furosemide (LASIX) 40 MG tablet   Oral   Take 0.5 tablets (20 mg total) by mouth daily.   30 tablet   5   . glyBURIDE (DIABETA) 5 MG tablet   Oral   Take 2.5 mg by mouth daily with breakfast.          . metoprolol tartrate (LOPRESSOR) 25 MG tablet   Oral   Take 1 tablet (25 mg total) by mouth 2 (two) times daily.   60 tablet   11   . omeprazole (PRILOSEC) 40 MG capsule   Oral   Take 40 mg by mouth daily.         . predniSONE (DELTASONE) 50 MG tablet      1 tab daily x 7 days   7 tablet   0   .  simvastatin (ZOCOR) 10 MG tablet   Oral   Take 10 mg by mouth at bedtime.         [name] tiotropium (SPIRIVA) 18 MCG inhalation capsule   Inhalation   Place 1 capsule (18 mcg total) into inhaler and inhale daily.  30 capsule   5   . warfarin (COUMADIN) 2 MG tablet   Oral   Take 1-2 mg by mouth daily. Takes 2 mg on Monday, Wednesday and Friday and 1 mg all other days         . zafirlukast (ACCOLATE) 20 MG tablet   Oral   Take 20 mg by mouth 2 (two) times daily.         [name] HYDROcodone-acetaminophen (NORCO/VICODIN) 5-325 MG per tablet   Oral   Take 1 tablet by mouth 3 (three) times daily as needed. For pain   90 tablet   0   . levofloxacin (LEVAQUIN) 750 MG tablet   Oral   Take 1 tablet (750 mg total) by mouth daily.   7 tablet   0    BP 110/68  Pulse 63  Temp(Src) 98.8 F (37.1 C) (Oral)  Resp 22  SpO2 97% Physical Exam  Constitutional: She is oriented to person, place, and time. She appears well-developed. No distress.  Speaking in full sentences.  HENT:  Head: Normocephalic and atraumatic.  Mouth/Throat: Oropharynx is clear and moist. No oropharyngeal exudate.  Eyes: Conjunctivae and EOM are normal. Pupils are equal, round, and reactive to light.  Neck: Normal range of motion. Neck supple.  Cardiovascular: Normal rate, regular rhythm and normal heart sounds.   No murmur heard. Pulmonary/Chest: She is in respiratory distress. She has wheezes.  Moderate air exchange with diffuse expiratory wheezing  Abdominal: Soft. There is no tenderness. There is no rebound and no guarding.  Musculoskeletal: Normal range of motion. She exhibits no edema and no tenderness.  Neurological: She is alert and oriented to person, place, and time. No cranial nerve deficit. She exhibits normal muscle tone. Coordination normal.  Skin: Skin is warm.    ED Course  Procedures (including critical care time) Labs Review Labs Reviewed  PROTIME-INR - Abnormal; Notable for the following:     Prothrombin Time 22.2 (*)    INR 2.02 (*)    All other components within normal limits  CBC WITH DIFFERENTIAL - Abnormal; Notable for the following:    RBC 3.28 (*)    Hemoglobin 10.8 (*)    HCT 32.7 (*)    Neutrophils Relative % 83 (*)    All other components within normal limits  COMPREHENSIVE METABOLIC PANEL - Abnormal; Notable for the following:    Glucose, Bld 123 (*)    BUN 28 (*)    Creatinine, Ser 1.30 (*)    Albumin 3.4 (*)    GFR calc non Af Amer 39 (*)    GFR calc Af Amer 45 (*)    All other components within normal limits  PRO B NATRIURETIC PEPTIDE - Abnormal; Notable for the following:    Pro B Natriuretic peptide (BNP) 923.5 (*)    All other components within normal limits  URINALYSIS, ROUTINE W REFLEX MICROSCOPIC - Abnormal; Notable for the following:    Leukocytes, UA TRACE (*)    All other components within normal limits  URINE MICROSCOPIC-ADD ON - Abnormal; Notable for the following:    Squamous Epithelial / LPF FEW (*)    Casts HYALINE CASTS (*)    All other components within normal limits  POCT I-STAT TROPONIN I   Imaging Review Dg Chest 2 View  06/27/2013   CLINICAL DATA:  Difficulty breathing, congestion  EXAM: CHEST  2 VIEW  COMPARISON:  Chest radiograph 05/11/2012  FINDINGS: Left-sided pacemaker overlies stable cardiac silhouette. There is increase in  perihilar airspace disease compared to prior. There is a new small left effusion. No focal consolidation. No pneumothorax. Degenerative osteophytosis of the thoracic spine.  IMPRESSION: Perihilar airspace disease suggests pulmonary edema. Small left effusion.   Electronically Signed   By: Suzy Bouchard M.D.   On: 06/27/2013 15:41    EKG Interpretation    Date/Time:  Tuesday June 27 2013 14:55:58 EST Ventricular Rate:  63 PR Interval:  51 QRS Duration: 118 QT Interval:  476 QTC Calculation: 487 R Axis:   -77 Text Interpretation:  Atrial flutter with 4:1 A-V conduction Short PR interval Incomplete  right bundle branch block Inferior infarct, age indeterminate Probable anterior infarct, age indeterminate Lateral T wave inversions previously paced Reconfirmed by Wyvonnia Dusky  MD, Lataysha Vohra ([id number]) on 06/27/2013 6:48:48 PM            MDM   1. Chest tightness or pressure   2. Atrial fibrillation   3. Chronic anticoagulation   4. Congestive heart failure, unspecified   5. COPD with acute exacerbation   6. Type II or unspecified type diabetes mellitus without mention of complication, not stated as uncontrolled   7. Warfarin anticoagulation    1 week history of cough, congestion, chest tightness. History of COPD, CHF, atrial fibrillation. T wave inversions in EKG apparently new.  Catheterization 2011 showed minimal coronary disease. EKG today shows atrial flutter with 4:1 conduction. Troponin is negative. She's wheezing on exam and chest x-ray shows interstitial infiltrates. She is given IV Lasix.  She appears to have a component of COPD and CHF exacerbation. Her INR is therapeutic making PE unlikely. She becomes dyspneic with exertion but not hypoxic. Will admit for serial troponins and further breathing treatments and diuresis.  Ezequiel Essex, MD 06/27/13 [phone]

## 2013-06-27 NOTE — Progress Notes (Signed)
Subjective:    Patient ID: [name], female    DOB: 1935-03-22, 78 y.o.   MRN: [id number]  HPI Pt presents today with chief complaint of cough and increased sputum production.  Pt with baseline hx/o multiple medical problems including afib, on coumadin, CHF s/p pacemaker, COPD, 1/2 PPD smoker.  Has increased cough, wheezing and sputum production over past week.  No fevers or chills.  Has also had some chest tigthness.  No radiation to neck or jaw.  No worsening orthopnea or PND.  No worsening LE swelling or weight gain.  Unsure of sick contacts  Pt also currently on ceftin for UTI dxd in ER about 1 week ago. Still having some mild dysuria.   Patient Active Problem List   Diagnosis Date Noted  . Vitamin D deficiency   . Peripheral edema 07/21/2012  . Carotid artery disease   . Tobacco abuse 05/12/2012  . Hypotension 05/11/2012  . Hypoglycemia 05/11/2012  . Dysuria 10/21/2011  . Abdominal wall hernia 10/19/2011  . Constipation 09/29/2011  . Abnormal CT of the abdomen 09/29/2011  . Abdominal pain 09/29/2011  . Weight loss 09/29/2011  . CAD (coronary artery disease)   . Abnormal CT of the chest   . Pacemaker-St.Jude   . Tachycardia-bradycardia syndrome 12/03/2010  . Chronic anticoagulation 12/03/2010  . Anxiety 12/03/2010  . Mitral stenosis 05/30/2010  . ANEMIA 05/16/2010  . Type II or unspecified type diabetes mellitus without mention of complication, not stated as uncontrolled 01/19/2009  . HYPERTENSION 01/19/2009  . Atrial fibrillation 01/19/2009  . CHF 01/19/2009  . COPD 01/19/2009  . HEART MURMUR, BENIGN 01/19/2009   Current Outpatient Prescriptions on File Prior to Visit  Medication Sig Dispense Refill  . albuterol (PROVENTIL HFA;VENTOLIN HFA) 108 (90 BASE) MCG/ACT inhaler Inhale 2 puffs into the lungs every 6 (six) hours as needed for wheezing.      . bisacodyl (DULCOLAX) 5 MG EC tablet Take 5 mg by mouth daily as needed for constipation.      . clonazePAM  (KLONOPIN) 0.5 MG tablet Take 1 tablet (0.5 mg total) by mouth 3 (three) times daily as needed.  90 tablet  0  . diltiazem (CARDIZEM CD) 120 MG 24 hr capsule Take 1 capsule (120 mg total) by mouth daily.  30 capsule  11  . Fluticasone-Salmeterol (ADVAIR) 250-50 MCG/DOSE AEPB Inhale 1 puff into the lungs every 12 (twelve) hours.      . furosemide (LASIX) 40 MG tablet Take 0.5 tablets (20 mg total) by mouth daily.  30 tablet  5  . glyBURIDE (DIABETA) 5 MG tablet Take 5 mg by mouth daily with breakfast.      . HYDROcodone-acetaminophen (NORCO/VICODIN) 5-325 MG per tablet Take 1 tablet by mouth 3 (three) times daily as needed. For pain  90 tablet  0  . metoprolol tartrate (LOPRESSOR) 25 MG tablet Take 1 tablet (25 mg total) by mouth 2 (two) times daily.  60 tablet  11  . omeprazole (PRILOSEC) 40 MG capsule Take 40 mg by mouth daily.      . simvastatin (ZOCOR) 10 MG tablet Take 10 mg by mouth at bedtime.      [name] tiotropium (SPIRIVA) 18 MCG inhalation capsule Place 1 capsule (18 mcg total) into inhaler and inhale daily.  30 capsule  5  . warfarin (COUMADIN) 2 MG tablet Take 1-2 mg by mouth daily. *Take one-half tablet (1mg ) every day in the evening, except take one tablet on Wednesdays only*      .  zafirlukast (ACCOLATE) 20 MG tablet Take 20 mg by mouth 2 (two) times daily.       No current facility-administered medications on file prior to visit.     Review of Systems  All other systems reviewed and are negative.       Objective:              Physical Exam  Constitutional:  Old, frail, appears older than stated age    HENT:  Head: Normocephalic and atraumatic.  Right Ear: External ear normal.  Left Ear: External ear normal.  +nasal erythema, rhinorrhea bilaterally, + post oropharyngeal erythema    Eyes: Conjunctivae are normal. Pupils are equal, round, and reactive to light.  Neck: Normal range of motion. Neck supple.  Cardiovascular: Normal rate and regular rhythm.    Pulmonary/Chest: Effort normal.  Significant diffuse wheezes and faint rales  Mild anterior chest wall TTP    Abdominal: Soft.  Musculoskeletal: Normal range of motion.  Neurological: She is alert.  Skin: Skin is warm.   Filed Vitals:   06/27/13 1215  BP: 93/60  Pulse: 63  Temp: 98.3 F (36.8 C)    EKG: sinus rhythm, new T wave flattening/inversions in lateral leads compared to previous EKGs.        Assessment & Plan:  UTI (urinary tract infection) - Plan: POCT UA - Microscopic Only, POCT urinalysis dipstick, Urine culture  Dyspnea  COPD exacerbation - Plan: methylPREDNISolone sodium succinate (SOLU-MEDROL) 130 mg in sodium chloride 0.9 % 50 mL IVPB, EKG, predniSONE (DELTASONE) 50 MG tablet, levofloxacin (LEVAQUIN) 750 MG tablet  Chest tightness or pressure - Plan: EKG  Encounter for monitoring coumadin therapy  Sxs seemed most consistent with COPD exacerbation with plan for levaquin and prednisone for COPD exacerbation treatment. Pt already received solumedrol 125mg  IM x1.  However, given chest pressure and new T wave inversions on EKG there is some concern for cardiac source of sxs, especially given baseline cardiac history including CAD and CHF/afib s/p pacemaker.   Clinically euvolemic on exam.  Pt given full dose ASA and supplemental O2 with plan for transfer to ER for further evaluation of sxs.  Already on coumadin. Last INR 11/3 @ 2.9. Higher bleeding risk given anticoagulant use.  > 50% of > 60 minutes spent with pt in terms of direct patient care and care coordination.  Attempted to discuss overall plan of care with cardiology at [facility] but was unable to connect to DOD prior to EMS arrival.  Clinical update: Spoke with Dr. Ron Parker at [facility] with ASA treatment acutely if there is concern for ACS.

## 2013-06-27 NOTE — Consult Note (Addendum)
CARDIOLOGY CONSULT NOTE   Patient ID: [name] MRN: [id number] DOB/AGE: 09/12/1934 78 y.o.  Admit Date: 06/27/2013 Primary Physician: Redge Gainer, MD Primary Cardiologist  [facility] Electrophysiology    [place]        [facility]  Clinical Summary [name] is a 78 y.o.female. She is followed by our team. She has a pacemaker that was checked remotely within the past 2 months. She was seen in the office in September in [place] by [place]. She was stable at that time. She has a history of normal left ventricular function. She has atrial fibrillation. She is coumadinized. She's had bradycardia tachycardia syndrome requiring a pacemaker. There is also history of mild mitral stenosis, mild mitral regurgitation, and mild aortic stenosis. Cardiac catheterization in the past that showed mild CAD. In the past she has also had some congestive heart failure.  Today she was seen by Dr. Ernestina Patches at [place]. She is a poor historian. It seems that her main complaint was cough and shortness of breath. She had some increase atrial fibrillation rate associated with this. She had some arm and leg pain. It was felt that she was having an exacerbation of her COPD. She was given medications for this and sent here to [facility]. There was question of T wave changes and therefore her dose of aspirin was given in addition to her baseline Coumadin therapy. She is now stable. While in the emergency room her troponin is normal. She is beginning to have some improvement in her respiratory status. Her atrial fibrillation rate is approximately 100.    Allergies  Allergen Reactions  . Torsemide Nausea And Vomiting  . Tramadol Nausea Only    Medications Scheduled Medications: . aspirin  324 mg Oral Once     Infusions:     PRN Medications:     Past Medical History  Diagnosis Date  . Permanent atrial fibrillation     H/o tachybradycardia syndrome on Coumadin  anticoagulation, has pacemaker.  [name] CAD (coronary artery disease) 2011    Catheterization August 2011: mild nonobstructive coronary artery disease complicated by large left rectus sheath hematoma status post evacuation Coumadin restarted without recurrence  . Diabetes mellitus   . Chronic airway obstruction, not elsewhere classified   . Anemia, unspecified   . Chronic kidney disease, stage II (mild)   . Unspecified hypertensive kidney disease with chronic kidney disease stage I through stage IV, or unspecified(403.90)   . Tachycardia-bradycardia     s/p St. Jude single chamber PPM 10/2010   . GERD (gastroesophageal reflux disease)   . Abnormal CT of the chest     Mediastinal and hilar adenopathy followed by Dr. Koleen Nimrod in [place]  . Congestive heart failure, unspecified     EF now 60%, previous nonischemic cardiomyopathy  . Pacemaker     Single chamber [place]. Jude ACCENT [phone] SN 719 04/11/1959 10/31/2010  . Valvular heart disease     a. H/o moderate mitral stenosis by cath 2011 but no mention of this on 10/2011 echo. b. 10/2011 echo: mod MR, mild AS, mild TR; EF>65%  . Hypercholesterolemia   . Diverticulitis Dec 2012  . Body mass index (BMI) of 20.0-20.9 in adult FEB 2013 147 LBS  . Recurrent UTI   . Neurogenic sleep apnea     Uses noctural oxygen prescribed by her pulmonologi  . Rheumatic heart disease   . Gastritis     By EGD  . Carotid artery  disease     Doppler, 05/2012, 0-39% bilateral  . Vitamin D deficiency   . Dilated bile duct 10/19/2011    Past Surgical History  Procedure Laterality Date  . Single-chamber pacemaker implantation  3/12    by Dr Caryl Comes  . Evacuation of retroperitoneal and left rectus sheath    . Cholecystectomy      40+ years ago  . Hernia repair      X3  . Pilonidal cyst / sinus excision    . Esophagogastroduodenoscopy  10/2011    Fields-erosive gastritis, biopsy with no H. pylori, hiatal hernia, peptic duodenitis, no celiac disease, duodenal diverticulum,  duodenal nodule  . Colonoscopy  10/2011    Dyann Ruddle sigmoid to descending diverticulosis, internal hemorrhoids  . Eus  11/16/11    Mishra-13 MM CBD, normal pancreatic duct, otherwise normal EUS    Family History  Problem Relation Age of Onset  . Cancer Mother     Uterus  . Cancer Daughter     kidney cancer, in remission  . Heart disease Father   . Colon cancer Neg Hx     Social History [name] reports that she has been smoking Cigarettes.  She has a 22.5 pack-year smoking history. She has never used smokeless tobacco. [name] reports that she does not drink alcohol.  Review of Systems  Patient denies fever, chills, headache, sweats, rash, change in vision, change in hearing, nausea vomiting, urinary symptoms. All other systems are reviewed and are negative other than the history of present illness.   Physical Examination Blood pressure 110/68, pulse 63, temperature 98.8 F (37.1 C), temperature source Oral, resp. rate 22, SpO2 97.00%. No intake or output data in the 24 hours ending 06/27/13 1748   the patient is thin. She is poor dentition. She is oriented to person time and place. Affect is normal. There is no jugulovenous distention. Lung exam reveals diffuse rhonchi. She is in no distress at this time. Cardiac exam reveals an irregularly irregular rhythm. There is an outflow murmur consistent with her aortic valve disease. The abdomen is soft. There is no significant peripheral edema.  Telemetry:  I personally reviewed telemetry. There is atrial fibrillation. The rate is approximately 100.   Prior Cardiac Testing/Procedures  Lab Results  Basic Metabolic Panel:  Recent Labs Lab 06/27/13 1551  NA 135  K 3.8  CL 101  CO2 25  GLUCOSE 123*  BUN 28*  CREATININE 1.30*  CALCIUM 9.3    Liver Function Tests:  Recent Labs Lab 06/27/13 1551  AST 19  ALT 10  ALKPHOS 87  BILITOT 0.7  PROT 7.3  ALBUMIN 3.4*    CBC:  Recent Labs Lab 06/27/13 1551    WBC 7.7  NEUTROABS 6.4  HGB 10.8*  HCT 32.7*  MCV 99.7  PLT 171    Cardiac Enzymes: No results found for this basename: CKTOTAL, CKMB, CKMBINDEX, TROPONINI,  in the last 168 hours  BNP: No components found with this basename: POCBNP,    Radiology: Dg Chest 2 View  06/27/2013   CLINICAL DATA:  Difficulty breathing, congestion  EXAM: CHEST  2 VIEW  COMPARISON:  Chest radiograph 05/11/2012  FINDINGS: Left-sided pacemaker overlies stable cardiac silhouette. There is increase in perihilar airspace disease compared to prior. There is a new small left effusion. No focal consolidation. No pneumothorax. Degenerative osteophytosis of the thoracic spine.  IMPRESSION: Perihilar airspace disease suggests pulmonary edema. Small left effusion.   Electronically Signed   By: Helane Gunther.D.  On: 06/27/2013 15:41     ECG:  EKG reveals atrial fib or atrial flutter. The rhythm is similar to the rhythm seen in the past. There is an interventricular conduction delay. There decreased anterior R waves. There is no significant change in the EKG.    Impression and Recommendations  At this time it appears that there are several issues playing a role with her cough and shortness of breath. There is suggestion of a pulmonary process. She has received medications for this. In addition there is suggestion of volume overload on her chest x-ray. She is receiving some IV Lasix in the emergency room. Her first troponin is normal. Her pro BNP is elevated.     Pacemaker-St.Jude       Patient has a pacemaker for bradycardia tachycardia syndrome. We know that it was checked remotely within the past few months and functioning normally.    Type II or unspecified type diabetes mellitus      Her diabetes will need to be managed carefully while in the hospital.    Atrial fibrillation     She has either atrial fibrillation or an atypical atrial flutter. This is old. She is anticoagulated. Her rate is increased  mildly. I suspect this is secondary to her underlying illness.   Acute on chronic diastolic CHF    There is a component of volume overload. She is receiving some diuretics. It is not clear if this is the primary event or if this is secondary to a pulmonary illness also. Plan to continue diuresis watching her renal function.    COPD     Part of her presentation is consistent with acute pulmonary decompensation.    Tachycardia-bradycardia syndrome      The patient has a pacemaker. We will follow her rate as she improves from her underlying problems.        CAD (coronary artery disease)     First troponin is normal. Continue to assess with 3 troponins.    Warfarin anticoagulation     Plan to continue Coumadin.  It seems most appropriate that the patient be admitted by general medicine. We can follow the cardiac issues.   Daryel November, MD 06/27/2013, 5:48 PM

## 2013-06-27 NOTE — Patient Instructions (Signed)
Smoking Cessation Quitting smoking is important to your health and has many advantages. However, it is not always easy to quit since nicotine is a very addictive drug. Often times, people try 3 times or more before being able to quit. This document explains the best ways for you to prepare to quit smoking. Quitting takes hard work and a lot of effort, but you can do it. ADVANTAGES OF QUITTING SMOKING  You will live longer, feel better, and live better.  Your body will feel the impact of quitting smoking almost immediately.  Within 20 minutes, blood pressure decreases. Your pulse returns to its normal level.  After 8 hours, carbon monoxide levels in the blood return to normal. Your oxygen level increases.  After 24 hours, the chance of having a heart attack starts to decrease. Your breath, hair, and body stop smelling like smoke.  After 48 hours, damaged nerve endings begin to recover. Your sense of taste and smell improve.  After 72 hours, the body is virtually free of nicotine. Your bronchial tubes relax and breathing becomes easier.  After 2 to 12 weeks, lungs can hold more air. Exercise becomes easier and circulation improves.  The risk of having a heart attack, stroke, cancer, or lung disease is greatly reduced.  After 1 year, the risk of coronary heart disease is cut in half.  After 5 years, the risk of stroke falls to the same as a nonsmoker.  After 10 years, the risk of lung cancer is cut in half and the risk of other cancers decreases significantly.  After 15 years, the risk of coronary heart disease drops, usually to the level of a nonsmoker.  If you are pregnant, quitting smoking will improve your chances of having a healthy baby.  The people you live with, especially any children, will be healthier.  You will have extra money to spend on things other than cigarettes. QUESTIONS TO THINK ABOUT BEFORE ATTEMPTING TO QUIT You may want to talk about your answers with your  caregiver.  Why do you want to quit?  If you tried to quit in the past, what helped and what did not?  What will be the most difficult situations for you after you quit? How will you plan to handle them?  Who can help you through the tough times? Your family? Friends? A caregiver?  What pleasures do you get from smoking? What ways can you still get pleasure if you quit? Here are some questions to ask your caregiver:  How can you help me to be successful at quitting?  What medicine do you think would be best for me and how should I take it?  What should I do if I need more help?  What is smoking withdrawal like? How can I get information on withdrawal? GET READY  Set a quit date.  Change your environment by getting rid of all cigarettes, ashtrays, matches, and lighters in your home, car, or work. Do not let people smoke in your home.  Review your past attempts to quit. Think about what worked and what did not. GET SUPPORT AND ENCOURAGEMENT You have a better chance of being successful if you have help. You can get support in many ways.  Tell your family, friends, and co-workers that you are going to quit and need their support. Ask them not to smoke around you.  Get individual, group, or telephone counseling and support. Programs are available at local hospitals and health centers. Call your local health department for   information about programs in your area.  Spiritual beliefs and practices may help some smokers quit.  Download a "quit meter" on your computer to keep track of quit statistics, such as how long you have gone without smoking, cigarettes not smoked, and money saved.  Get a self-help book about quitting smoking and staying off of tobacco. LEARN NEW SKILLS AND BEHAVIORS  Distract yourself from urges to smoke. Talk to someone, go for a walk, or occupy your time with a task.  Change your normal routine. Take a different route to work. Drink tea instead of coffee.  Eat breakfast in a different place.  Reduce your stress. Take a hot bath, exercise, or read a book.  Plan something enjoyable to do every day. Reward yourself for not smoking.  Explore interactive web-based programs that specialize in helping you quit. GET MEDICINE AND USE IT CORRECTLY Medicines can help you stop smoking and decrease the urge to smoke. Combining medicine with the above behavioral methods and support can greatly increase your chances of successfully quitting smoking.  Nicotine replacement therapy helps deliver nicotine to your body without the negative effects and risks of smoking. Nicotine replacement therapy includes nicotine gum, lozenges, inhalers, nasal sprays, and skin patches. Some may be available over-the-counter and others require a prescription.  Antidepressant medicine helps people abstain from smoking, but how this works is unknown. This medicine is available by prescription.  Nicotinic receptor partial agonist medicine simulates the effect of nicotine in your brain. This medicine is available by prescription. Ask your caregiver for advice about which medicines to use and how to use them based on your health history. Your caregiver will tell you what side effects to look out for if you choose to be on a medicine or therapy. Carefully read the information on the package. Do not use any other product containing nicotine while using a nicotine replacement product.  RELAPSE OR DIFFICULT SITUATIONS Most relapses occur within the first 3 months after quitting. Do not be discouraged if you start smoking again. Remember, most people try several times before finally quitting. You may have symptoms of withdrawal because your body is used to nicotine. You may crave cigarettes, be irritable, feel very hungry, cough often, get headaches, or have difficulty concentrating. The withdrawal symptoms are only temporary. They are strongest when you first quit, but they will go away within  10 14 days. To reduce the chances of relapse, try to:  Avoid drinking alcohol. Drinking lowers your chances of successfully quitting.  Reduce the amount of caffeine you consume. Once you quit smoking, the amount of caffeine in your body increases and can give you symptoms, such as a rapid heartbeat, sweating, and anxiety.  Avoid smokers because they can make you want to smoke.  Do not let weight gain distract you. Many smokers will gain weight when they quit, usually less than 10 pounds. Eat a healthy diet and stay active. You can always lose the weight gained after you quit.  Find ways to improve your mood other than smoking. FOR MORE INFORMATION  www.smokefree.gov  Document Released: 07/21/2001 Document Revised: 01/26/2012 Document Reviewed: 11/05/2011 [employer] Patient Information 2014 [employer].  

## 2013-06-27 NOTE — ED Notes (Signed)
Patient transported to X-ray 

## 2013-06-27 NOTE — ED Notes (Signed)
Pt has had one week of chest pain exertional and sob.  Pt has copd/chf/pm/afib. Pt from MD office asa 324mg , Solumedrol 125mg .  Pt had inverted t waves from last ekg

## 2013-06-27 NOTE — Progress Notes (Signed)
ANTICOAGULATION CONSULT NOTE - Initial Consult  Pharmacy Consult for Warfarin Indication: atrial fibrillation  Allergies  Allergen Reactions  . Torsemide Nausea And Vomiting  . Tramadol Nausea Only    Patient Measurements: Height: 5\' 3"  (160 cm) Weight: 153 lb 8 oz (69.627 kg) (scale b) IBW/kg (Calculated) : 52.4 Heparin Dosing Weight:  Vital Signs: Temp: 98 F (36.7 C) (11/18 1930) Temp src: Oral (11/18 1930) BP: 99/42 mmHg (11/18 1930) Pulse Rate: 88 (11/18 1930)  Labs:  Recent Labs  06/27/13 1551  HGB 10.8*  HCT 32.7*  PLT 171  LABPROT 22.2*  INR 2.02*  CREATININE 1.30*    Estimated Creatinine Clearance: 34.5 ml/min (by C-G formula based on Cr of 1.3).   Medical History: Past Medical History  Diagnosis Date  . Permanent atrial fibrillation     H/o tachybradycardia syndrome on Coumadin anticoagulation, has pacemaker.  [name] CAD (coronary artery disease) 2011    Catheterization August 2011: mild nonobstructive coronary artery disease complicated by large left rectus sheath hematoma status post evacuation Coumadin restarted without recurrence  . Diabetes mellitus   . Chronic airway obstruction, not elsewhere classified   . Anemia, unspecified   . Chronic kidney disease, stage II (mild)   . Unspecified hypertensive kidney disease with chronic kidney disease stage I through stage IV, or unspecified(403.90)   . Tachycardia-bradycardia     s/p St. Jude single chamber PPM 10/2010   . GERD (gastroesophageal reflux disease)   . Abnormal CT of the chest     Mediastinal and hilar adenopathy followed by Dr. Koleen Nimrod in [place]  . Congestive heart failure, unspecified     EF now 60%, previous nonischemic cardiomyopathy  . Pacemaker     Single chamber [place]. Jude ACCENT [phone] SN [id number] 04/11/1959 10/31/2010  . Valvular heart disease     a. H/o moderate mitral stenosis by cath 2011 but no mention of this on 10/2011 echo. b. 10/2011 echo: mod MR, mild AS, mild TR; EF>65%  .  Hypercholesterolemia   . Diverticulitis Dec 2012  . Body mass index (BMI) of 20.0-20.9 in adult FEB 2013 147 LBS  . Recurrent UTI   . Neurogenic sleep apnea     Uses noctural oxygen prescribed by her pulmonologi  . Rheumatic heart disease   . Gastritis     By EGD  . Carotid artery disease     Doppler, 05/2012, 0-39% bilateral  . Vitamin D deficiency   . Dilated bile duct 10/19/2011    Medications:  Scheduled:  . albuterol  2.5 mg Nebulization Q6H  . aspirin  324 mg Oral Once  . [START ON 06/28/2013] diltiazem  120 mg Oral Daily  . [START ON 06/28/2013] furosemide  40 mg Intravenous BID  . guaiFENesin  1,200 mg Oral BID  . heparin  5,000 Units Subcutaneous Q8H  . ipratropium  0.5 mg Nebulization Q6H  . levofloxacin  750 mg Oral QHS  . methylPREDNISolone (SOLU-MEDROL) injection  80 mg Intravenous Q8H  . metoprolol tartrate  25 mg Oral BID  . mometasone-formoterol  2 puff Inhalation BID  . [START ON 06/28/2013] montelukast  10 mg Oral QHS  . [START ON 06/28/2013] pantoprazole  40 mg Oral Daily  . simvastatin  10 mg Oral QHS  . sodium chloride  3 mL Intravenous Q12H  . [START ON 06/28/2013] tiotropium  18 mcg Inhalation Daily  . warfarin  1 mg Oral Once  . warfarin  1-2 mg Oral Daily  . [START ON 06/28/2013] Warfarin -  Pharmacist Dosing Inpatient   Does not apply q1800    Assessment: 78 yr old female sent to the ED from the [facility] where she had gone complaining of shortness of breath and chest tightness. Her EKG showed ST wave changes so she was sent for further evaluation. She is on chronic coumadin anticoagulation for a.fib and pharmacy has been asked to dose it. Her INR today is 2.02 and her home coumadin dose is 2 mg M,W,F and 1 mg the other days.  Goal of Therapy:  INR 2-3    Plan: Will follow her home dose of coumadin and give her 1 mg today.  INR ordered daily with am labs.   Minta Balsam 06/27/2013,9:39 PM

## 2013-06-28 DIAGNOSIS — I4891 Unspecified atrial fibrillation: Secondary | ICD-10-CM

## 2013-06-28 DIAGNOSIS — R0789 Other chest pain: Secondary | ICD-10-CM

## 2013-06-28 DIAGNOSIS — I2510 Atherosclerotic heart disease of native coronary artery without angina pectoris: Secondary | ICD-10-CM

## 2013-06-28 DIAGNOSIS — I5033 Acute on chronic diastolic (congestive) heart failure: Secondary | ICD-10-CM

## 2013-06-28 DIAGNOSIS — I5032 Chronic diastolic (congestive) heart failure: Secondary | ICD-10-CM

## 2013-06-28 DIAGNOSIS — I359 Nonrheumatic aortic valve disorder, unspecified: Secondary | ICD-10-CM

## 2013-06-28 DIAGNOSIS — I495 Sick sinus syndrome: Secondary | ICD-10-CM | POA: Diagnosis not present

## 2013-06-28 DIAGNOSIS — F17200 Nicotine dependence, unspecified, uncomplicated: Secondary | ICD-10-CM

## 2013-06-28 DIAGNOSIS — Z950 Presence of cardiac pacemaker: Secondary | ICD-10-CM | POA: Diagnosis not present

## 2013-06-28 DIAGNOSIS — Z7901 Long term (current) use of anticoagulants: Secondary | ICD-10-CM

## 2013-06-28 DIAGNOSIS — J441 Chronic obstructive pulmonary disease with (acute) exacerbation: Secondary | ICD-10-CM | POA: Diagnosis not present

## 2013-06-28 LAB — PROTIME-INR
INR: 2.09 — ABNORMAL HIGH (ref 0.00–1.49)
Prothrombin Time: 22.8 seconds — ABNORMAL HIGH (ref 11.6–15.2)

## 2013-06-28 LAB — URINE CULTURE
Colony Count: NO GROWTH
Culture: NO GROWTH

## 2013-06-28 LAB — GLUCOSE, CAPILLARY
Glucose-Capillary: 171 mg/dL — ABNORMAL HIGH (ref 70–99)
Glucose-Capillary: 191 mg/dL — ABNORMAL HIGH (ref 70–99)
Glucose-Capillary: 222 mg/dL — ABNORMAL HIGH (ref 70–99)

## 2013-06-28 LAB — BASIC METABOLIC PANEL
BUN: 33 mg/dL — ABNORMAL HIGH (ref 6–23)
CO2: 23 mEq/L (ref 19–32)
Chloride: 100 mEq/L (ref 96–112)
Creatinine, Ser: 1.37 mg/dL — ABNORMAL HIGH (ref 0.50–1.10)
GFR calc Af Amer: 42 mL/min — ABNORMAL LOW (ref >90)
Glucose, Bld: 221 mg/dL — ABNORMAL HIGH (ref 70–99)

## 2013-06-28 LAB — TSH: TSH: 0.843 u[IU]/mL (ref 0.350–4.500)

## 2013-06-28 LAB — CBC
HCT: 32.8 % — ABNORMAL LOW (ref 36.0–46.0)
Hemoglobin: 11 g/dL — ABNORMAL LOW (ref 12.0–15.0)
MCH: 32.9 pg (ref 26.0–34.0)
MCHC: 33.5 g/dL (ref 30.0–36.0)
MCV: 98.2 fL (ref 78.0–100.0)
Platelets: 167 10*3/uL (ref 150–400)

## 2013-06-28 LAB — TROPONIN I: Troponin I: <0.3 ng/mL (ref <0.30)

## 2013-06-28 MED ORDER — WARFARIN SODIUM 1 MG PO TABS
1.0000 mg | ORAL_TABLET | ORAL | Status: DC
  Filled 2013-06-28: qty 1

## 2013-06-28 MED ORDER — WARFARIN SODIUM 2 MG PO TABS
2.0000 mg | ORAL_TABLET | ORAL | Status: AC
  Administered 2013-06-28: 18:00:00 2 mg via ORAL
  Filled 2013-06-28: qty 1

## 2013-06-28 MED ORDER — ALBUTEROL SULFATE (5 MG/ML) 0.5% IN NEBU
2.5000 mg | INHALATION_SOLUTION | Freq: Four times a day (QID) | RESPIRATORY_TRACT | Status: DC
  Administered 2013-06-28 – 2013-06-29 (×3): 2.5 mg via RESPIRATORY_TRACT
  Filled 2013-06-28 (×3): qty 0.5

## 2013-06-28 MED ORDER — METHYLPREDNISOLONE SODIUM SUCC 125 MG IJ SOLR
80.0000 mg | Freq: Two times a day (BID) | INTRAMUSCULAR | Status: DC
  Administered 2013-06-29 (×2): 80 mg via INTRAVENOUS
  Filled 2013-06-28 (×3): qty 1.28

## 2013-06-28 MED ORDER — INSULIN ASPART 100 UNIT/ML ~~LOC~~ SOLN
0.0000 [IU] | Freq: Three times a day (TID) | SUBCUTANEOUS | Status: DC
Start: 2013-06-28 — End: 2013-06-29
  Administered 2013-06-28 – 2013-06-29 (×5): 2 [IU] via SUBCUTANEOUS

## 2013-06-28 MED ORDER — IPRATROPIUM BROMIDE 0.02 % IN SOLN: 0.5000 mg | Freq: Three times a day (TID) | RESPIRATORY_TRACT | Status: DC

## 2013-06-28 MED ORDER — INSULIN ASPART 100 UNIT/ML ~~LOC~~ SOLN
0.0000 [IU] | Freq: Every day | SUBCUTANEOUS | Status: DC
  Administered 2013-06-28: 21:00:00 2 [IU] via SUBCUTANEOUS

## 2013-06-28 MED ORDER — ALBUTEROL SULFATE (5 MG/ML) 0.5% IN NEBU
2.5000 mg | INHALATION_SOLUTION | Freq: Three times a day (TID) | RESPIRATORY_TRACT | Status: DC
  Administered 2013-06-28: 2.5 mg via RESPIRATORY_TRACT
  Filled 2013-06-28: qty 0.5

## 2013-06-28 MED ORDER — DILTIAZEM HCL ER COATED BEADS 180 MG PO CP24
180.0000 mg | ORAL_CAPSULE | Freq: Every day | ORAL | Status: DC
  Administered 2013-06-28 – 2013-06-29 (×2): 180 mg via ORAL
  Filled 2013-06-28 (×2): qty 1

## 2013-06-28 MED ORDER — BUDESONIDE 0.25 MG/2ML IN SUSP
0.2500 mg | Freq: Two times a day (BID) | RESPIRATORY_TRACT | Status: DC
  Administered 2013-06-28 – 2013-06-29 (×2): 0.25 mg via RESPIRATORY_TRACT
  Filled 2013-06-28 (×4): qty 2

## 2013-06-28 MED ORDER — LEVOFLOXACIN 750 MG PO TABS
750.0000 mg | ORAL_TABLET | ORAL | Status: DC
  Filled 2013-06-28: qty 1

## 2013-06-28 NOTE — Progress Notes (Addendum)
SUBJECTIVE: The patient is better today.  Her SOB is slowly improving.  She is still wheezing.  At this time, she denies chest pain or any new concerns.   Admitted yesterday for acute COPD exacerbation - questionable T wave changes on EKG and so cardiology was consulted.  Pt is not having chest pain currently.  Cardiac enzymes have been negative.    She has permanent atrial fibrillation/flutter and is rate controlled.  Device interrogation today reveals that her heart rates have been well controlled.  She states she has occasional tachycardia and takes an extra Metoprolol prn.  These episodes are very infrequent.   CURRENT MEDICATIONS: . albuterol  2.5 mg Nebulization Q6H  . diltiazem  120 mg Oral Daily  . furosemide  40 mg Intravenous BID  . guaiFENesin  1,200 mg Oral BID  . heparin  5,000 Units Subcutaneous Q8H  . insulin aspart  0-5 Units Subcutaneous QHS  . insulin aspart  0-9 Units Subcutaneous TID WC  . ipratropium  0.5 mg Nebulization Q6H  . levofloxacin  750 mg Oral QHS  . methylPREDNISolone (SOLU-MEDROL) injection  80 mg Intravenous Q8H  . metoprolol tartrate  25 mg Oral BID  . mometasone-formoterol  2 puff Inhalation BID  . montelukast  10 mg Oral QHS  . pantoprazole  40 mg Oral Daily  . simvastatin  10 mg Oral QHS  . sodium chloride  3 mL Intravenous Q12H  . tiotropium  18 mcg Inhalation Daily  . Warfarin - Pharmacist Dosing Inpatient   Does not apply q1800      OBJECTIVE: Physical Exam: Filed Vitals:   06/27/13 2204 06/28/13 0108 06/28/13 0239 06/28/13 0626  BP: 130/62  109/52 126/53  Pulse: 77  47 85  Temp: 98 F (36.7 C)  97.8 F (36.6 C) 97.9 F (36.6 C)  TempSrc: Oral  Oral Oral  Resp: 20  19 18   Height:      Weight:    151 lb 10.8 oz (68.8 kg)  SpO2: 98% 99% 100% 100%    Intake/Output Summary (Last 24 hours) at 06/28/13 [id number] Last data filed at 06/28/13 0630  Gross per 24 hour  Intake      0 ml  Output   1325 ml  Net  -1325 ml    Telemetry  reveals atypical atrial flutter, rates mostly well controlled with occasional ventricular rates in the 90's  GEN- The patient is thin and chronically ill appearing, alert and oriented x 3 today.   Head- normocephalic, atraumatic Eyes-  Sclera clear, conjunctiva pink Ears- hearing intact Oropharynx- clear Neck- supple  Lungs- diffuse expiratory wheezes, prolonged WOB Heart- irregular rate and rhythm, no murmurs, rubs or gallops, PMI not laterally displaced GI- soft, NT, ND, + BS Extremities- no clubbing, cyanosis, or edema Skin- no rash or lesion Psych- euthymic mood, full affect Neuro- strength and sensation are intact  LABS: Basic Metabolic Panel:  Recent Labs  06/27/13 1551 06/28/13 0250  NA 135 138  K 3.8 4.5  CL 101 100  CO2 25 23  GLUCOSE 123* 221*  BUN 28* 33*  CREATININE 1.30* 1.37*  CALCIUM 9.3 9.9   Liver Function Tests:  Recent Labs  06/27/13 1551  AST 19  ALT 10  ALKPHOS 87  BILITOT 0.7  PROT 7.3  ALBUMIN 3.4*   CBC:  Recent Labs  06/27/13 1551 06/28/13 0250  WBC 7.7 7.3  NEUTROABS 6.4  --   HGB 10.8* 11.0*  HCT 32.7* 32.8*  MCV 99.7  98.2  PLT 171 167   Cardiac Enzymes:  Recent Labs  06/27/13 2140 06/28/13 0250  TROPONINI <0.30 <0.30   Thyroid Function Tests:  Recent Labs  06/27/13 2140  TSH 0.843   INR - 2.09  RADIOLOGY: Dg Chest 2 View 06/27/2013   CLINICAL DATA:  Difficulty breathing, congestion  EXAM: CHEST  2 VIEW  COMPARISON:  Chest radiograph 05/11/2012  FINDINGS: Left-sided pacemaker overlies stable cardiac silhouette. There is increase in perihilar airspace disease compared to prior. There is a new small left effusion. No focal consolidation. No pneumothorax. Degenerative osteophytosis of the thoracic spine.  IMPRESSION: Perihilar airspace disease suggests pulmonary edema. Small left effusion.   Electronically Signed   By: Suzy Bouchard M.D.   On: 06/27/2013 15:41    ASSESSMENT AND PLAN:  Principal Problem:   COPD  with acute exacerbation Active Problems:   Type II or unspecified type diabetes mellitus without mention of complication, not stated as uncontrolled   Atrial fibrillation   CHF   COPD   Tachycardia-bradycardia syndrome   Chronic anticoagulation   CAD (coronary artery disease)   Pacemaker-St.Jude   Warfarin anticoagulation   Acute on chronic diastolic CHF (congestive heart failure)  1. afib Her pacemaker is interrogated this am (see paper chart) and reveals adequate heart rate control at home. I think that her elevated V rates here are due to her respiratory illness. I will increase diltiazem to 180mg  daily today. Continue coumadin long term  2. Shortness of breath Appears to be due to COPD exacerbation She is wheezing on exam She does not appear to be significantly volume overloaded today Primary team to treat COPD Smoking cessation was strongly advised and discussed at length by me  3. Chronic diastolic dysfunction Stable Echo is pending  4. Tachy/brady syndrome Normal pacemaker function See paper chart No changes today  5. Chest tightness Due to COPD exacerbation No further CV risk stratification is required during this admission  I will see as needed while here. She should follow-up with Dr Bronson Ing in 3-4 weeks in [place]

## 2013-06-28 NOTE — Progress Notes (Signed)
I Lear Ng Hipp's Production assistant, radio) assessment, I&O, care plan, education, and med administration.

## 2013-06-28 NOTE — Progress Notes (Addendum)
[facility] PROGRESS NOTE  [name] [id number] DOB: [birth year] DOA: 06/27/2013 PCP: Redge Gainer, MD  Assessment/Plan: Acute respiratory failure with hypoxia due to COPD exacerbation  -Continue antibiotics, nebulizer treatment, IV steroids -Continue oxygen supplementation and will evaluate for home oxygen needs. -Cont supportive management with antitussives and mucolytics.   Acute on chronic diastolic CHF  -Chest x-ray showed evidence of mild pulmonary edema, high BNP on admission. Last 2-D echo in 2013 showed preserved EF.  -Continue IV Lasix for one more day. -Will follow 2-D echocardiogram -Troponin negative and no ischemic changes on telemetry -Monitor renal function closely and repeat electrolytes as needed.  -Restricts fluids to only 1500 mL per day, check weight daily.  -[facility] cardiology consultation and recommendations.  Atrial fibrillation/flutter  -Patient is controlled with Cardizem and metoprolol.  -Is on Coumadin for chronic anticoagulation, pharmacy to manage the dosage.  -Follow cardiology recommendations  Type 2 diabetes mellitus  -Seems like patient has very tight glycemic control.  -Last A1c was on 05/11/2013 was 5.2 which correlate with mean plasma glucose of 107.  -She is on glyburide at home. Holding oral hypoglycemic regimen while inpatient. -Continue modified carbohydrate diet and a sliding scale insulin  Chronic kidney disease stage 2-3: A stable and with creatinine baseline. Will monitor her kidney function closely.  GERD: Continue PPI  Hyperlipidemia: Continue statins.  Tobacco abuse: Counseling has been provided. HM has refused nicotine patch during this admission.   Code Status: Full code. Family Communication: No family at bedside Disposition Plan: Home when medically stable   Consultants:  Cardiology  Procedures:  See below for x-ray reports  Antibiotics:  Levaquin  HPI/Subjective: Feeling significantly  better, denies chest pain. He improve breathing and air movement. She is still wheezing  Objective: Filed Vitals:   06/28/13 1442  BP: 109/55  Pulse: 89  Temp: 97.7 F (36.5 C)  Resp: 18    Intake/Output Summary (Last 24 hours) at 06/28/13 1548 Last data filed at 06/28/13 1500  Gross per 24 hour  Intake    603 ml  Output   2801 ml  Net  -2198 ml   Filed Weights   06/27/13 1930 06/28/13 0626  Weight: 69.627 kg (153 lb 8 oz) 68.8 kg (151 lb 10.8 oz)    Exam:   General:  Afebrile, feeling better and reporting improvement in her breathing  Cardiovascular: Irregular rate and rhythm, no murmurs, no rubs or gallops.  Respiratory: Positive expiratory wheezing, no crackles  Abdomen: Soft, positive bowel sounds, nontender, nondistended  Musculoskeletal: No edema, no cyanosis or clubbing.   Data Reviewed: Basic Metabolic Panel:  Recent Labs Lab 06/27/13 1551 06/28/13 0250  NA 135 138  K 3.8 4.5  CL 101 100  CO2 25 23  GLUCOSE 123* 221*  BUN 28* 33*  CREATININE 1.30* 1.37*  CALCIUM 9.3 9.9   Liver Function Tests:  Recent Labs Lab 06/27/13 1551  AST 19  ALT 10  ALKPHOS 87  BILITOT 0.7  PROT 7.3  ALBUMIN 3.4*   CBC:  Recent Labs Lab 06/27/13 1551 06/28/13 0250  WBC 7.7 7.3  NEUTROABS 6.4  --   HGB 10.8* 11.0*  HCT 32.7* 32.8*  MCV 99.7 98.2  PLT 171 167   Cardiac Enzymes:  Recent Labs Lab 06/27/13 2140 06/28/13 0250 06/28/13 0827  TROPONINI <0.30 <0.30 <0.30   BNP (last 3 results)  Recent Labs  06/27/13 1551  PROBNP 923.5*   CBG:  Recent Labs Lab 06/27/13 2214 06/28/13 [id number] 06/28/13 1228  GLUCAP 295* 171* 190*    Studies: Dg Chest 2 View  06/27/2013   CLINICAL DATA:  Difficulty breathing, congestion  EXAM: CHEST  2 VIEW  COMPARISON:  Chest radiograph 05/11/2012  FINDINGS: Left-sided pacemaker overlies stable cardiac silhouette. There is increase in perihilar airspace disease compared to prior. There is a new small left  effusion. No focal consolidation. No pneumothorax. Degenerative osteophytosis of the thoracic spine.  IMPRESSION: Perihilar airspace disease suggests pulmonary edema. Small left effusion.   Electronically Signed   By: Suzy Bouchard M.D.   On: 06/27/2013 15:41    Scheduled Meds: . albuterol  2.5 mg Nebulization QID  . budesonide (PULMICORT) nebulizer solution  0.25 mg Nebulization BID  . diltiazem  180 mg Oral Daily  . furosemide  40 mg Intravenous BID  . guaiFENesin  1,200 mg Oral BID  . insulin aspart  0-5 Units Subcutaneous QHS  . insulin aspart  0-9 Units Subcutaneous TID WC  . [START ON 06/29/2013] levofloxacin  750 mg Oral Q48H  . [START ON 06/29/2013] methylPREDNISolone (SOLU-MEDROL) injection  80 mg Intravenous Q12H  . metoprolol tartrate  25 mg Oral BID  . montelukast  10 mg Oral QHS  . pantoprazole  40 mg Oral Daily  . simvastatin  10 mg Oral QHS  . sodium chloride  3 mL Intravenous Q12H  . tiotropium  18 mcg Inhalation Daily  . [START ON 06/29/2013] warfarin  1 mg Oral Custom  . warfarin  2 mg Oral Q M,W,F-1800  . Warfarin - Pharmacist Dosing Inpatient   Does not apply q1800    Time spent: >30 minutes    Alazar Cherian  [facility] Pager [phone]. If 7PM-7AM, please contact night-coverage at [email or web address], password [facility] 06/28/2013, 3:48 PM  LOS: 1 day

## 2013-06-28 NOTE — Progress Notes (Signed)
ANTICOAGULATION CONSULT NOTE - Follow Up  Pharmacy Consult for Warfarin Indication: atrial fibrillation  Allergies  Allergen Reactions  . Torsemide Nausea And Vomiting  . Tramadol Nausea Only    Patient Measurements: Height: 5\' 3"  (160 cm) Weight: 151 lb 10.8 oz (68.8 kg) IBW/kg (Calculated) : 52.4   Vital Signs: Temp: 97.9 F (36.6 C) (11/19 1002) Temp src: Oral (11/19 1002) BP: 108/53 mmHg (11/19 1002) Pulse Rate: 90 (11/19 1002)  Labs:  Recent Labs  06/27/13 1551 06/27/13 2140 06/28/13 0250 06/28/13 0827  HGB 10.8*  --  11.0*  --   HCT 32.7*  --  32.8*  --   PLT 171  --  167  --   LABPROT 22.2*  --  22.8*  --   INR 2.02*  --  2.09*  --   CREATININE 1.30*  --  1.37*  --   TROPONINI  --  <0.30 <0.30 <0.30    Estimated Creatinine Clearance: 32.5 ml/min (by C-G formula based on Cr of 1.37).   Medical History: Past Medical History  Diagnosis Date  . Permanent atrial fibrillation     H/o tachybradycardia syndrome on Coumadin anticoagulation, has pacemaker.  [name] CAD (coronary artery disease) 2011    Catheterization August 2011: mild nonobstructive coronary artery disease complicated by large left rectus sheath hematoma status post evacuation Coumadin restarted without recurrence  . Diabetes mellitus   . Chronic airway obstruction, not elsewhere classified   . Anemia, unspecified   . Chronic kidney disease, stage II (mild)   . Unspecified hypertensive kidney disease with chronic kidney disease stage I through stage IV, or unspecified(403.90)   . Tachycardia-bradycardia     s/p St. Jude single chamber PPM 10/2010   . GERD (gastroesophageal reflux disease)   . Abnormal CT of the chest     Mediastinal and hilar adenopathy followed by Dr. Koleen Nimrod in [place]  . Congestive heart failure, unspecified     EF now 60%, previous nonischemic cardiomyopathy  . Pacemaker     Single chamber [place]. Jude ACCENT [phone] SN 719 04/11/1959 10/31/2010  . Valvular heart disease     a.  H/o moderate mitral stenosis by cath 2011 but no mention of this on 10/2011 echo. b. 10/2011 echo: mod MR, mild AS, mild TR; EF>65%  . Hypercholesterolemia   . Diverticulitis Dec 2012  . Body mass index (BMI) of 20.0-20.9 in adult FEB 2013 147 LBS  . Recurrent UTI   . Neurogenic sleep apnea     Uses noctural oxygen prescribed by her pulmonologi  . Rheumatic heart disease   . Gastritis     By EGD  . Carotid artery disease     Doppler, 05/2012, 0-39% bilateral  . Vitamin D deficiency   . Dilated bile duct 10/19/2011    Medications:  Scheduled:  . albuterol  2.5 mg Nebulization TID  . diltiazem  180 mg Oral Daily  . furosemide  40 mg Intravenous BID  . guaiFENesin  1,200 mg Oral BID  . insulin aspart  0-5 Units Subcutaneous QHS  . insulin aspart  0-9 Units Subcutaneous TID WC  . [START ON 06/29/2013] levofloxacin  750 mg Oral Q48H  . methylPREDNISolone (SOLU-MEDROL) injection  80 mg Intravenous Q8H  . metoprolol tartrate  25 mg Oral BID  . mometasone-formoterol  2 puff Inhalation BID  . montelukast  10 mg Oral QHS  . pantoprazole  40 mg Oral Daily  . simvastatin  10 mg Oral QHS  . sodium chloride  3 mL Intravenous Q12H  . tiotropium  18 mcg Inhalation Daily  . [START ON 06/29/2013] warfarin  1 mg Oral Custom  . warfarin  2 mg Oral Q M,W,F-1800  . Warfarin - Pharmacist Dosing Inpatient   Does not apply q1800    Assessment: 78 yr old female sent to the ED from the [facility] where she had gone complaining of shortness of breath and chest tightness. Her EKG showed ST wave changes so she was sent for further evaluation. She is on chronic coumadin anticoagulation for a.fib and pharmacy has been asked to dose Her INR today remains therapeutic on home regimen 2 mg M,W,F and 1 mg the other days.  Goal of Therapy:  INR 2-3    Plan: Will follow her home dose of coumadin and give her 1 mg today.  INR ordered daily with am labs.  Levaquin dose adjusted for CrCl~33 ml/min.  Excell Seltzer Poteet 06/28/2013,12:33 PM

## 2013-06-28 NOTE — Progress Notes (Signed)
UR Completed.  Vergie Living [id number] 06/28/2013

## 2013-06-28 NOTE — Evaluation (Signed)
Physical Therapy Evaluation Patient Details Name: [name] MRN: [id number] DOB: 1934/10/13 Today's Date: 06/28/2013 Time: [id number] PT Time Calculation (min): 14 min  PT Assessment / Plan / Recommendation History of Present Illness  Pt. is a 78 y/o female admitted for COPD exacerbation.  Clinical Impression  Pt admitted with COPD exacerbation. Pt currently with functional limitations due to the deficits listed below (see PT Problem List). Feel pt. Can safely progress to home with intermittent supervision from family.  Pt. May need RW at d/c as she was unsteady with hand held A today. Pt will benefit from skilled PT to increase their independence and safety with mobility to allow discharge to the venue listed below.     PT Assessment  Patient needs continued PT services    Follow Up Recommendations  Home health PT;Supervision - Intermittent    Does the patient have the potential to tolerate intense rehabilitation      Barriers to Discharge Inaccessible home environment;Decreased caregiver support pt. lives alone; anticipate pt. can d/c home with intermittent supervision    Equipment Recommendations  Rolling walker with 5" wheels    Recommendations for Other Services     Frequency Min 3X/week    Precautions / Restrictions Precautions Precautions: Fall Restrictions Weight Bearing Restrictions: No   Pertinent Vitals/Pain Pt. Denied pain      Mobility  Bed Mobility Bed Mobility: Not assessed Details for Bed Mobility Assistance: pt. sitting EOB upon entering room Transfers Transfers: Sit to Stand;Stand to Sit;Stand Pivot Transfers Sit to Stand: 5: Supervision;From bed;With upper extremity assist Stand to Sit: To bed;To chair/3-in-1;With armrests;With upper extremity assist;5: Supervision Stand Pivot Transfers: 4: Min guard;With armrests Details for Transfer Assistance: pt. reaching for UE support with stand pivot transfers; LOB x1 however pt. able to self  correct Ambulation/Gait Ambulation/Gait Assistance: 4: Min guard Ambulation Distance (Feet): 100 Feet Assistive device: Rolling walker Ambulation/Gait Assistance Details: cues to keep RW close Gait Pattern: Step-to pattern (bil. er with increased toe out) Gait velocity: decreased Stairs: No Wheelchair Mobility Wheelchair Mobility: No    Exercises     PT Diagnosis: Difficulty walking;Abnormality of gait;Generalized weakness  PT Problem List: Decreased strength;Decreased range of motion;Decreased balance;Decreased activity tolerance;Decreased mobility;Decreased knowledge of use of DME;Decreased safety awareness;Decreased knowledge of precautions PT Treatment Interventions: DME instruction;Gait training;Stair training;Functional mobility training;Therapeutic activities;Therapeutic exercise;Balance training;Neuromuscular re-education;Patient/family education     PT Goals(Current goals can be found in the care plan section) Acute Rehab PT Goals Patient Stated Goal: to return home PT Goal Formulation: With patient Time For Goal Achievement: 07/05/13 Potential to Achieve Goals: Good  Visit Information  Last PT Received On: 06/28/13 Assistance Needed: +1 History of Present Illness: Pt. is a 78 y/o female admitted for COPD exacerbation.       Prior [place] expects to be discharged to:: Private residence Living Arrangements: Alone Available Help at Discharge: Family;Available PRN/intermittently Type of Home: Mobile home Home Access: Stairs to enter Entrance Stairs-Number of Steps: 6 Entrance Stairs-Rails: Can reach both Home Layout: One level Home Equipment: Cane - single point Prior Function Level of Independence: Independent with assistive device(s) Comments: used cane at night and in community Communication Communication: No difficulties Dominant Hand: Left    Cognition  Cognition Arousal/Alertness: Awake/alert Behavior During Therapy:  WFL for tasks assessed/performed Overall Cognitive Status: Within Functional Limits for tasks assessed    Extremity/Trunk Assessment Upper Extremity Assessment Upper Extremity Assessment: Defer to OT evaluation Lower Extremity Assessment Lower Extremity Assessment: Generalized weakness Cervical /  Trunk Assessment Cervical / Trunk Assessment: Kyphotic   Balance Balance Balance Assessed: Yes Static Sitting Balance Static Sitting - Balance Support: No upper extremity supported;Feet supported Static Sitting - Level of Assistance: 7: Independent Static Sitting - Comment/# of Minutes: 5 Static Standing Balance Static Standing - Balance Support: Bilateral upper extremity supported;During functional activity Static Standing - Level of Assistance: 5: Stand by assistance Static Standing - Comment/# of Minutes: 4  End of Session PT - End of Session Equipment Utilized During Treatment: Gait belt Activity Tolerance: Patient tolerated treatment well Patient left: in bed;with call bell/phone within reach Nurse Communication: Mobility status  GP     Faustino Congress K 06/28/2013, 9:31 AM  Laureen Abrahams, PT, DPT [phone]

## 2013-06-28 NOTE — Progress Notes (Signed)
Patient was 98% on RA.  Patient walked in hallway on room air O2 sat was between 97-99%.  Patient had slight SOB after completing walking from room to other end of the hallway and past the nurse's station and back to room. O2 saturation remained 98%.  Patient states that she uses oxygen at home at night only, because her oxygen levels drop (per her dr she stated).

## 2013-06-29 DIAGNOSIS — R5381 Other malaise: Secondary | ICD-10-CM

## 2013-06-29 DIAGNOSIS — F17200 Nicotine dependence, unspecified, uncomplicated: Secondary | ICD-10-CM | POA: Diagnosis not present

## 2013-06-29 DIAGNOSIS — I4891 Unspecified atrial fibrillation: Secondary | ICD-10-CM | POA: Diagnosis not present

## 2013-06-29 DIAGNOSIS — I5033 Acute on chronic diastolic (congestive) heart failure: Secondary | ICD-10-CM | POA: Diagnosis not present

## 2013-06-29 LAB — BASIC METABOLIC PANEL
BUN: 44 mg/dL — ABNORMAL HIGH (ref 6–23)
Calcium: 10 mg/dL (ref 8.4–10.5)
Chloride: 97 mEq/L (ref 96–112)
GFR calc Af Amer: 42 mL/min — ABNORMAL LOW (ref >90)
GFR calc non Af Amer: 36 mL/min — ABNORMAL LOW (ref >90)
Glucose, Bld: 174 mg/dL — ABNORMAL HIGH (ref 70–99)
Potassium: 4 mEq/L (ref 3.5–5.1)
Sodium: 135 mEq/L (ref 135–145)

## 2013-06-29 LAB — GLUCOSE, CAPILLARY
Glucose-Capillary: 168 mg/dL — ABNORMAL HIGH (ref 70–99)
Glucose-Capillary: 183 mg/dL — ABNORMAL HIGH (ref 70–99)

## 2013-06-29 LAB — PROTIME-INR: INR: 2.5 — ABNORMAL HIGH (ref 0.00–1.49)

## 2013-06-29 MED ORDER — WARFARIN SODIUM 2 MG PO TABS: 2.0000 mg | ORAL_TABLET | ORAL | Status: DC

## 2013-06-29 MED ORDER — GUAIFENESIN ER 600 MG PO TB12: 600.0000 mg | ORAL_TABLET | Freq: Two times a day (BID) | ORAL | Status: DC

## 2013-06-29 MED ORDER — BUDESONIDE-FORMOTEROL FUMARATE 160-4.5 MCG/ACT IN AERO: 2.0000 | INHALATION_SPRAY | Freq: Two times a day (BID) | RESPIRATORY_TRACT | Status: DC

## 2013-06-29 MED ORDER — LEVOFLOXACIN 500 MG PO TABS: 500.0000 mg | ORAL_TABLET | Freq: Every day | ORAL | Status: DC

## 2013-06-29 MED ORDER — FUROSEMIDE 40 MG PO TABS: 40.0000 mg | ORAL_TABLET | Freq: Every day | ORAL | Status: DC

## 2013-06-29 MED ORDER — PREDNISONE 20 MG PO TABS: ORAL_TABLET | ORAL | Status: DC

## 2013-06-29 NOTE — Progress Notes (Signed)
Patient's IV and telemetry has been discontinued, patient verbalizes understanding of discharge instructions and has no further questions.  Patient is being discharged home alone with Home Health services. Ruben Im RN

## 2013-06-29 NOTE — Progress Notes (Signed)
Physical Therapy Treatment Patient Details Name: [name] MRN: [id number] DOB: [birth year] Today's Date: 06/29/2013 Time: [id number] PT Time Calculation (min): 21 min  PT Assessment / Plan / Recommendation  History of Present Illness Pt. is a 78 y/o female admitted for COPD exacerbation.   PT Comments   Pt admitted with above. Pt currently with functional limitations due to the balance and endurance deficits.  Given that pt will be alone, recommended use of RW at home for ultimate safety.  Pt aware.  HHPT f/u as well.  Pt will benefit from skilled PT to increase their independence and safety with mobility to allow discharge to the venue listed below.   Follow Up Recommendations  Home health PT;Supervision - Intermittent                 Equipment Recommendations  Rolling walker with 5" wheels        Frequency Min 3X/week   Progress towards PT Goals Progress towards PT goals: Progressing toward goals  Plan Current plan remains appropriate    Precautions / Restrictions Precautions Precautions: Fall Restrictions Weight Bearing Restrictions: No   Pertinent Vitals/Pain VSS, no pain    Mobility  Bed Mobility Bed Mobility: Not assessed Transfers Transfers: Sit to Stand;Stand to Sit Sit to Stand: 5: Supervision;From bed;With upper extremity assist Stand to Sit: To bed;To chair/3-in-1;With armrests;With upper extremity assist;5: Supervision Stand Pivot Transfers: Not tested (comment) Details for Transfer Assistance: Pt able to place her shoes on by herself.  Safe technique with sit to stand. Ambulation/Gait Ambulation/Gait Assistance: 4: Min guard Ambulation Distance (Feet): 300 Feet Assistive device: Small based quad cane Ambulation/Gait Assistance Details: Pt occasionally kicked the [facility] with LOB but pt was able to maintain balance.   Gait Pattern: Step-to pattern (bil. er with increased toe out) Gait velocity: decreased Stairs: No Wheelchair Mobility Wheelchair  Mobility: No     PT Goals (current goals can now be found in the care plan section)    Visit Information  Last PT Received On: 06/29/13 Assistance Needed: +1 History of Present Illness: Pt. is a 78 y/o female admitted for COPD exacerbation.    Subjective Data  Subjective: "I feel better."   Cognition  Cognition Arousal/Alertness: Awake/alert Behavior During Therapy: WFL for tasks assessed/performed Overall Cognitive Status: Within Functional Limits for tasks assessed    Balance  Static Sitting Balance Static Sitting - Balance Support: No upper extremity supported;Feet supported Static Sitting - Level of Assistance: 7: Independent Static Sitting - Comment/# of Minutes: 5 Static Standing Balance Static Standing - Balance Support: Bilateral upper extremity supported;During functional activity Static Standing - Level of Assistance: 5: Stand by assistance Static Standing - Comment/# of Minutes: 4  End of Session PT - End of Session Equipment Utilized During Treatment: Gait belt Activity Tolerance: Patient tolerated treatment well Patient left: in chair;with call bell/phone within reach Nurse Communication: Mobility status       INGOLD,Malachi Kinzler 06/29/2013, 3:12 PM [facility] [phone] (pager)

## 2013-06-29 NOTE — Progress Notes (Signed)
ANTICOAGULATION CONSULT NOTE - Follow Up  Pharmacy Consult for Warfarin Indication: atrial fibrillation  Allergies  Allergen Reactions  . Torsemide Nausea And Vomiting  . Tramadol Nausea Only    Patient Measurements: Height: 5\' 3"  (160 cm) Weight: 151 lb 3.8 oz (68.6 kg) (scale B) IBW/kg (Calculated) : 52.4   Vital Signs: Temp: 97.9 F (36.6 C) (11/20 0950) Temp src: Oral (11/20 0950) BP: 102/50 mmHg (11/20 0950) Pulse Rate: 72 (11/20 0950)  Labs:  Recent Labs  06/27/13 1551 06/27/13 2140 06/28/13 0250 06/28/13 0827 06/29/13 0600  HGB 10.8*  --  11.0*  --   --   HCT 32.7*  --  32.8*  --   --   PLT 171  --  167  --   --   LABPROT 22.2*  --  22.8*  --  26.2*  INR 2.02*  --  2.09*  --  2.50*  CREATININE 1.30*  --  1.37*  --  1.37*  TROPONINI  --  <0.30 <0.30 <0.30  --     Estimated Creatinine Clearance: 32.5 ml/min (by C-G formula based on Cr of 1.37).    Assessment: 78 yr old female on chronic coumadin anticoagulation for a.fib Her INR (2.5) remains therapeutic on home regimen 2 mg M,W,F and 1 mg the other days. No bleeding reported.  On levaquin 750 mg po q48 hrs  Goal of Therapy:  INR 2-3   Plan: 1. Continue home dose of 1 mg TTSS and 2 mg MWF 2. DC daily INR Eudelia Bunch, Pharm.D. [id number] 06/29/2013 10:34 AM

## 2013-06-29 NOTE — Discharge Summary (Addendum)
Physician Discharge Summary  [name] [id number] DOB: May 10, 1935 DOA: 06/27/2013  PCP: Redge Gainer, MD  Admit date: 06/27/2013 Discharge date: 06/29/2013  Time spent: >30 minutes  Recommendations for Outpatient Follow-up:  1. BMET to follow electrolytes and renal function 2. Please arrange outpatient pulmonology service visit for further evaluation and long term treatment on her COPD  Discharge Diagnoses:  Principal Problem:   COPD with acute exacerbation Active Problems:   Type II or unspecified type diabetes mellitus without mention of complication, not stated as uncontrolled   Atrial fibrillation   CHF   COPD   Tachycardia-bradycardia syndrome   Chronic anticoagulation   CAD (coronary artery disease)   Pacemaker-St.Jude   Warfarin anticoagulation   Acute on chronic diastolic CHF (congestive heart failure)   Discharge Condition: stable and improved. Will discharge home with [facility] services and follow with PCP in 1 week  Diet recommendation: low sodium heart healthy diet  Filed Weights   06/27/13 1930 06/28/13 0626 06/29/13 0538  Weight: 69.627 kg (153 lb 8 oz) 68.8 kg (151 lb 10.8 oz) 68.6 kg (151 lb 3.8 oz)    History of present illness:  78 y.o. female with past medical history of COPD, atrial fibrillation and sick sinus syndrome status post pacemaker. Patient given to the hospital because of shortness of breath and some chest tightness. Patient said for the past 2 days she felt congested, she has cough with minimal sputum production, she's been wheezing and progressively getting short of breath. She called her primary care physician last night was particularly to urgent care in a.m. She was seen today in [facility] urgent [facility], given Solu-Medrol and Levaquin. Her EKG showed ST wave changes so she was sent to the hospital for further evaluation.  In the ED her chest x-ray showed mild pulmonary edema suggesting CHF, POC troponin is negative, BNP is 923. Her INR  is 2.0.   Hospital Course:  Acute respiratory failure with hypoxia due to COPD exacerbation and diastolic heart failure exacerbation -Continue antibiotics, nebulizer treatment and tapering steroids  -Continue oxygen supplementation as needed.  -Cont supportive management with antitussives and mucolytics.  -will substitute advair for symbicort -patient will benefit of pulmonology visit at discharge for further evaluation and treatment -advise to quit smoking  Acute on chronic diastolic CHF  -Chest x-ray showed evidence of mild pulmonary edema, high BNP on admission. Last 2-D echo in 2013 showed preserved EF.  -will discharge on home regimen lasix and will ask patient to be compliant with low sodium diet. -2-D echocardiogram during this admission has demonstrated preserved EF (60-65%), no wall motion abnormalities and just mild aortic stenosis -Troponin negative and no ischemic changes on telemetry  -fluid restriction to 1.5-2 L   Atrial fibrillation/flutter  -Patient is controlled with Cardizem and metoprolol.  -Is on Coumadin for chronic anticoagulation -Will follow cardiology service in outpatient setting for further evaluation and recommendations   Type 2 diabetes mellitus  -Seems like patient has very tight glycemic control.  -Last A1c was on 05/11/2013 was 5.2 which correlate with mean plasma glucose of 107.  -She is on glyburide at home; which will be resumed. -Continue modified carbohydrate diet   Chronic kidney disease stage 2-3: stable and with creatinine at baseline.  -BMET to be follow by PCP during next appointment.   GERD: Continue PPI   Hyperlipidemia: Continue statins.   Physical deconditioning: -will arrange for [facility] services (PT/OT/RN) -rolling walker for DME  Tobacco abuse: Counseling has been provided. Patient  refused  nicotine patch during this admission. She said she is looking to quit.  Procedures: See below for x-ray reports -2D echo: no wall motion  abnormalities, preserved EF (60-65%) and mild aortic stenosis  Consultations:  PT  Discharge Exam: Filed Vitals:   06/29/13 1409  BP: 113/52  Pulse: 71  Temp: 97.9 F (36.6 C)  Resp: 18   General: Afebrile, feeling better and reporting improvement in her breathing. Good O2 sat on RA (at rest and ambulation)  Cardiovascular: Irregular rate and rhythm, no murmurs, no rubs or gallops.  Respiratory: mild expiratory wheezing, no crackles and good air movement.  Abdomen: Soft, positive bowel sounds, nontender, nondistended  Musculoskeletal: No edema, no cyanosis or clubbing.   Discharge Instructions  Discharge Orders   Future Appointments Provider Department Dept Phone   07/13/2013 11:20 AM [facility] Pharmacist [place] [phone]   08/04/2013 1:20 PM Herminio Commons, [place] [phone]   08/22/2013 8:25 AM [facility] [place] Office [phone]   08/23/2013 10:00 AM [name], [place] [facility] [phone]   Future Orders Complete By Expires   Discharge instructions  As directed    Comments:     Take medications as prescribed Follow with PCP in 1 week Call cardiology office to confirm appointment details Follow a low sodium diet (less than 2,000 mg daily) Limit your fluid intake to 2L daily Stop smoking   For home use only DME Walker rolling  As directed        Medication List    STOP taking these medications       Fluticasone-Salmeterol 250-50 MCG/DOSE Aepb  Commonly known as:  ADVAIR      TAKE these medications       albuterol 108 (90 BASE) MCG/ACT inhaler  Commonly known as:  PROVENTIL HFA;VENTOLIN HFA  Inhale 2 puffs into the lungs every 6 (six) hours as needed for wheezing.     bisacodyl 5 MG EC tablet  Commonly known as:  DULCOLAX  Take 5 mg by mouth daily as needed for constipation.     budesonide-formoterol 160-4.5 MCG/ACT  inhaler  Commonly known as:  SYMBICORT  Inhale 2 puffs into the lungs 2 times daily at 12 noon and 4 pm.     clonazePAM 0.5 MG tablet  Commonly known as:  KLONOPIN  Take 1 tablet (0.5 mg total) by mouth 3 (three) times daily as needed.     diltiazem 120 MG 24 hr capsule  Commonly known as:  CARDIZEM CD  Take 1 capsule (120 mg total) by mouth daily.     furosemide 40 MG tablet  Commonly known as:  LASIX  Take 1 tablet (40 mg total) by mouth daily.     glyBURIDE 5 MG tablet  Commonly known as:  DIABETA  Take 2.5 mg by mouth daily with breakfast.     guaiFENesin 600 MG 12 hr tablet  Commonly known as:  MUCINEX  Take 1 tablet (600 mg total) by mouth 2 (two) times daily.     HYDROcodone-acetaminophen 5-325 MG per tablet  Commonly known as:  NORCO/VICODIN  Take 1 tablet by mouth 3 (three) times daily as needed. For pain     levofloxacin 500 MG tablet  Commonly known as:  LEVAQUIN  Take 1 tablet (500 mg total) by mouth daily.     metoprolol tartrate 25 MG tablet  Commonly known as:  LOPRESSOR  Take 1 tablet (25 mg total)  by mouth 2 (two) times daily.     omeprazole 40 MG capsule  Commonly known as:  PRILOSEC  Take 40 mg by mouth daily.     predniSONE 20 MG tablet  Commonly known as:  DELTASONE  Take 3 tablets by mouth daily X 2 days; then take 2 tablets by mouth daily X 2 days, then 1 tablet by mouth daily X 2 days; then 1/2 tablet by mouth daily X 3 days and stop prednisone     simvastatin 10 MG tablet  Commonly known as:  ZOCOR  Take 10 mg by mouth at bedtime.     tiotropium 18 MCG inhalation capsule  Commonly known as:  SPIRIVA  Place 1 capsule (18 mcg total) into inhaler and inhale daily.     warfarin 2 MG tablet  Commonly known as:  COUMADIN  Take 1-2 mg by mouth daily. Takes 2 mg on Monday, Wednesday and Friday and 1 mg all other days     zafirlukast 20 MG tablet  Commonly known as:  ACCOLATE  Take 20 mg by mouth 2 (two) times daily.       Allergies   Allergen Reactions  . Torsemide Nausea And Vomiting  . Tramadol Nausea Only       Follow-up Information   Follow up with Redge Gainer, MD. Schedule an appointment as soon as possible for a visit in 1 week.   Specialty:  Family Medicine   Contact information:   [address] [place] [address] [phone]        The results of significant diagnostics from this hospitalization (including imaging, microbiology, ancillary and laboratory) are listed below for reference.    Significant Diagnostic Studies: Dg Chest 2 View  06/27/2013   CLINICAL DATA:  Difficulty breathing, congestion  EXAM: CHEST  2 VIEW  COMPARISON:  Chest radiograph 05/11/2012  FINDINGS: Left-sided pacemaker overlies stable cardiac silhouette. There is increase in perihilar airspace disease compared to prior. There is a new small left effusion. No focal consolidation. No pneumothorax. Degenerative osteophytosis of the thoracic spine.  IMPRESSION: Perihilar airspace disease suggests pulmonary edema. Small left effusion.   Electronically Signed   By: Suzy Bouchard M.D.   On: 06/27/2013 15:41    Microbiology: Recent Results (from the past 240 hour(s))  URINE CULTURE     Status: None   Collection Time    06/27/13  1:00 PM      Result Value Range Status   Urine Culture, Routine Final report   Final   Result 1 Comment   Final   Comment: Mixed urogenital flora     25,000-50,000 colony forming units per mL  URINE CULTURE     Status: None   Collection Time    06/27/13  5:11 PM      Result Value Range Status   Specimen Description URINE, CLEAN CATCH   Final   Special Requests NONE   Final   Culture  Setup Time     Final   Value: 06/27/2013 22:11     Performed at [facility]     Final   Value: NO GROWTH     Performed at [facility]   Culture     Final   Value: NO GROWTH     Performed at [facility]   Report Status 06/28/2013 FINAL   Final     Labs: Basic  Metabolic Panel:  Recent Labs Lab 06/27/13 1551 06/28/13 0250 06/29/13  0600  NA 135 138 135  K 3.8 4.5 4.0  CL 101 100 97  CO2 25 23 26   GLUCOSE 123* 221* 174*  BUN 28* 33* 44*  CREATININE 1.30* 1.37* 1.37*  CALCIUM 9.3 9.9 10.0   Liver Function Tests:  Recent Labs Lab 06/27/13 1551  AST 19  ALT 10  ALKPHOS 87  BILITOT 0.7  PROT 7.3  ALBUMIN 3.4*   CBC:  Recent Labs Lab 06/27/13 1551 06/28/13 0250  WBC 7.7 7.3  NEUTROABS 6.4  --   HGB 10.8* 11.0*  HCT 32.7* 32.8*  MCV 99.7 98.2  PLT 171 167   Cardiac Enzymes:  Recent Labs Lab 06/27/13 2140 06/28/13 0250 06/28/13 0827  TROPONINI <0.30 <0.30 <0.30   BNP: BNP (last 3 results)  Recent Labs  06/27/13 1551  PROBNP 923.5*   CBG:  Recent Labs Lab 06/28/13 1228 06/28/13 1648 06/28/13 2026 06/29/13 0618 06/29/13 1117  GLUCAP 190* 191* 222* 168* 183*    Signed:  Enoc Getter  [facility] 06/29/2013, 2:40 PM

## 2013-06-29 NOTE — Progress Notes (Addendum)
Spoke with pt. regarding discharge planning. CM offered pt. list of home health agencies. Pt. chose [facility] to render services of RN and PT. Janae Sauce, RN of [facility] notified. No DME needs identified at this time.

## 2013-07-01 ENCOUNTER — Other Ambulatory Visit: Payer: Self-pay | Admitting: Nurse Practitioner

## 2013-07-01 DIAGNOSIS — I5033 Acute on chronic diastolic (congestive) heart failure: Secondary | ICD-10-CM | POA: Diagnosis not present

## 2013-07-01 DIAGNOSIS — Z950 Presence of cardiac pacemaker: Secondary | ICD-10-CM | POA: Diagnosis not present

## 2013-07-01 DIAGNOSIS — I2510 Atherosclerotic heart disease of native coronary artery without angina pectoris: Secondary | ICD-10-CM | POA: Diagnosis not present

## 2013-07-01 DIAGNOSIS — N182 Chronic kidney disease, stage 2 (mild): Secondary | ICD-10-CM | POA: Diagnosis not present

## 2013-07-01 DIAGNOSIS — I129 Hypertensive chronic kidney disease with stage 1 through stage 4 chronic kidney disease, or unspecified chronic kidney disease: Secondary | ICD-10-CM | POA: Diagnosis not present

## 2013-07-01 DIAGNOSIS — I509 Heart failure, unspecified: Secondary | ICD-10-CM | POA: Diagnosis not present

## 2013-07-01 DIAGNOSIS — J441 Chronic obstructive pulmonary disease with (acute) exacerbation: Secondary | ICD-10-CM | POA: Diagnosis not present

## 2013-07-01 DIAGNOSIS — E119 Type 2 diabetes mellitus without complications: Secondary | ICD-10-CM | POA: Diagnosis not present

## 2013-07-01 DIAGNOSIS — I4891 Unspecified atrial fibrillation: Secondary | ICD-10-CM | POA: Diagnosis not present

## 2013-07-03 ENCOUNTER — Telehealth: Payer: Self-pay | Admitting: Gastroenterology

## 2013-07-03 MED ORDER — ESOMEPRAZOLE MAGNESIUM 40 MG PO CPDR: DELAYED_RELEASE_CAPSULE | ORAL | Status: DC

## 2013-07-03 NOTE — Addendum Note (Signed)
Addended by: Danie Binder on: 07/03/2013 03:50 PM   Modules accepted: Orders, Medications

## 2013-07-03 NOTE — Telephone Encounter (Signed)
Patient is c/o a lot of gas on her stomach and she is wondering if she can take a higher dose of Nexium or can we advise something else for her to take, please advise?

## 2013-07-03 NOTE — Telephone Encounter (Signed)
I called pt and she said that she is taking Omeprazole qd and she is still having so much reflux and gas. She wants to know if she can try something else. Please advise!

## 2013-07-03 NOTE — Telephone Encounter (Signed)
Pt aware of info and I also mailed her the copy .

## 2013-07-03 NOTE — Telephone Encounter (Signed)
PLEASE CALL PT. SHE SHOULD:  FOLLOW LIFESTYLE RECOMMENDATION TO HELP MANAGE HER REFLUX CHEST PAIN. SEE INFO BELOW.  START NEXIUM. TAKE 30 MINUTES BEFORE MEALS TWICE DAILY.  CONTINUE HER WEIGHT LOSS EFFORTS. LOSE 10 LBS WITHIN THE NEXT 3 MOS.  FOLLOW A LOW FAT DIET. SEE INFO BELOW.  START AN EXERCISE PROGRAM.  ATTEMPT TO WALK FOR 30 MINS STRAIGHT 3 TIMES A WEEK.  DRINK WATER. AVOID KOOL-AID, SWEET TEA, JUICE, & SODA.  FOLLOW UP IN APR 2015.     Lifestyle and home remedies TO MANAGE REFLUX/CHEST PAIN  You may eliminate or reduce the frequency of heartburn by making the following lifestyle changes:    Control your weight. Being overweight is a major risk factor for heartburn and GERD. Excess pounds put pressure on your abdomen, pushing up your stomach and causing acid to back up into your esophagus.     Eat smaller meals. 4 TO 6 MEALS A DAY. This reduces pressure on the lower esophageal sphincter, helping to prevent the valve from opening and acid from washing back into your esophagus.     Loosen your belt. Clothes that fit tightly around your waist put pressure on your abdomen and the lower esophageal sphincter.     Eliminate heartburn triggers. Everyone has specific triggers. Common triggers such as fatty or fried foods, spicy food, tomato sauce, carbonated beverages, alcohol, chocolate, mint, garlic, onion, caffeine and nicotine may make heartburn worse.     Avoid stooping or bending. Tying your shoes is OK. Bending over for longer periods to weed your garden isn't, especially soon after eating.     Don't lie down after a meal. Wait at least three to four hours after eating before going to bed, and don't lie down right after eating.     PUT THE HEAD OF YOUR BED ON 6 INCH BLOCKS.   Alternative medicine   Several home remedies exist for treating GERD, but they provide only temporary relief. They include drinking baking soda (sodium bicarbonate) added to water or drinking other  fluids such as baking soda mixed with cream of tartar and water.    Although these liquids create temporary relief by neutralizing, washing away or buffering acids, eventually they aggravate the situation by adding gas and fluid to your stomach, increasing pressure and causing more acid reflux. Further, adding more sodium to your diet may increase your blood pressure and add stress to your heart, and excessive bicarbonate ingestion can alter the acid-base balance in your body.      Low-Fat Diet BREADS, CEREALS, PASTA, RICE, DRIED PEAS, AND BEANS These products are high in carbohydrates and most are low in fat. Therefore, they can be increased in the diet as substitutes for fatty foods. They too, however, contain calories and should not be eaten in excess. Cereals can be eaten for snacks as well as for breakfast.   FRUITS AND VEGETABLES It is good to eat fruits and vegetables. Besides being sources of fiber, both are rich in vitamins and some minerals. They help you get the daily allowances of these nutrients. Fruits and vegetables can be used for snacks and desserts.  MEATS Limit lean meat, chicken, [place], and fish to no more than 6 ounces per day. Beef, Pork, and Lamb Use lean cuts of beef, pork, and lamb. Lean cuts include:  Extra-lean ground beef.  Arm roast.  Sirloin tip.  Center-cut ham.  Round steak.  Loin chops.  Rump roast.  Tenderloin.  Trim all fat off the  outside of meats before cooking. It is not necessary to severely decrease the intake of red meat, but lean choices should be made. Lean meat is rich in protein and contains a highly absorbable form of iron. Premenopausal women, in particular, should avoid reducing lean red meat because this could increase the risk for low red blood cells (iron-deficiency anemia).  Chicken and [place] These are good sources of protein. The fat of poultry can be reduced by removing the skin and underlying fat layers before cooking. Chicken and  [place] can be substituted for lean red meat in the diet. Poultry should not be fried or covered with high-fat sauces. Fish and Shellfish Fish is a good source of protein. Shellfish contain cholesterol, but they usually are low in saturated fatty acids. The preparation of fish is important. Like chicken and [place], they should not be fried or covered with high-fat sauces. EGGS Egg whites contain no fat or cholesterol. They can be eaten often. Try 1 to 2 egg whites instead of whole eggs in recipes or use egg substitutes that do not contain yolk. MILK AND DAIRY PRODUCTS Use skim or 1% milk instead of 2% or whole milk. Decrease whole milk, natural, and processed cheeses. Use nonfat or low-fat (2%) cottage cheese or low-fat cheeses made from vegetable oils. Choose nonfat or low-fat (1 to 2%) yogurt. Experiment with evaporated skim milk in recipes that call for heavy cream. Substitute low-fat yogurt or low-fat cottage cheese for sour cream in dips and salad dressings. Have at least 2 servings of low-fat dairy products, such as 2 glasses of skim (or 1%) milk each day to help get your daily calcium intake. FATS AND OILS Reduce the total intake of fats, especially saturated fat. Butterfat, lard, and beef fats are high in saturated fat and cholesterol. These should be avoided as much as possible. Vegetable fats do not contain cholesterol, but certain vegetable fats, such as coconut oil, palm oil, and palm kernel oil are very high in saturated fats. These should be limited. These fats are often used in bakery goods, processed foods, popcorn, oils, and nondairy creamers. Vegetable shortenings and some peanut butters contain hydrogenated oils, which are also saturated fats. Read the labels on these foods and check for saturated vegetable oils. Unsaturated vegetable oils and fats do not raise blood cholesterol. However, they should be limited because they are fats and are high in calories. Total fat should still be  limited to 30% of your daily caloric intake. Desirable liquid vegetable oils are corn oil, cottonseed oil, olive oil, canola oil, safflower oil, soybean oil, and sunflower oil. Peanut oil is not as good, but small amounts are acceptable. Buy a heart-healthy tub margarine that has no partially hydrogenated oils in the ingredients. Mayonnaise and salad dressings often are made from unsaturated fats, but they should also be limited because of their high calorie and fat content. Seeds, nuts, peanut butter, olives, and avocados are high in fat, but the fat is mainly the unsaturated type. These foods should be limited mainly to avoid excess calories and fat. OTHER EATING TIPS Snacks  Most sweets should be limited as snacks. They tend to be rich in calories and fats, and their caloric content outweighs their nutritional value. Some good choices in snacks are graham crackers, melba toast, soda crackers, bagels (no egg), English muffins, fruits, and vegetables. These snacks are preferable to snack crackers, [place] fries, TORTILLA CHIPS, and POTATO chips. Popcorn should be air-popped or cooked in small amounts of liquid  vegetable oil. Desserts Eat fruit, low-fat yogurt, and fruit ices instead of pastries, cake, and cookies. Sherbet, angel food cake, gelatin dessert, frozen low-fat yogurt, or other frozen products that do not contain saturated fat (pure fruit juice bars, frozen ice pops) are also acceptable.  COOKING METHODS Choose those methods that use little or no fat. They include: Poaching.  Braising.  Steaming.  Grilling.  Baking.  Stir-frying.  Broiling.  Microwaving.  Foods can be cooked in a nonstick pan without added fat, or use a nonfat cooking spray in regular cookware. Limit fried foods and avoid frying in saturated fat. Add moisture to lean meats by using water, broth, cooking wines, and other nonfat or low-fat sauces along with the cooking methods mentioned above. Soups and stews should be  chilled after cooking. The fat that forms on top after a few hours in the refrigerator should be skimmed off. When preparing meals, avoid using excess salt. Salt can contribute to raising blood pressure in some people.  EATING AWAY FROM HOME Order entres, potatoes, and vegetables without sauces or butter. When meat exceeds the size of a deck of cards (3 to 4 ounces), the rest can be taken home for another meal. Choose vegetable or fruit salads and ask for low-calorie salad dressings to be served on the side. Use dressings sparingly. Limit high-fat toppings, such as bacon, crumbled eggs, cheese, sunflower seeds, and olives. Ask for heart-healthy tub margarine instead of butter.

## 2013-07-04 ENCOUNTER — Encounter: Payer: Self-pay | Admitting: Nurse Practitioner

## 2013-07-04 ENCOUNTER — Ambulatory Visit (INDEPENDENT_AMBULATORY_CARE_PROVIDER_SITE_OTHER): Payer: Medicare Other | Admitting: Nurse Practitioner

## 2013-07-04 VITALS — BP 106/61 | HR 75 | Temp 98.5°F | Ht 61.0 in | Wt 150.0 lb

## 2013-07-04 DIAGNOSIS — J441 Chronic obstructive pulmonary disease with (acute) exacerbation: Secondary | ICD-10-CM

## 2013-07-04 DIAGNOSIS — I509 Heart failure, unspecified: Secondary | ICD-10-CM

## 2013-07-04 DIAGNOSIS — I5042 Chronic combined systolic (congestive) and diastolic (congestive) heart failure: Secondary | ICD-10-CM | POA: Diagnosis not present

## 2013-07-04 DIAGNOSIS — Z09 Encounter for follow-up examination after completed treatment for conditions other than malignant neoplasm: Secondary | ICD-10-CM | POA: Diagnosis not present

## 2013-07-04 MED ORDER — CLONAZEPAM 0.5 MG PO TABS: 0.5000 mg | ORAL_TABLET | Freq: Three times a day (TID) | ORAL | Status: DC | PRN

## 2013-07-04 NOTE — Patient Instructions (Signed)
Heart Failure °Heart failure is a condition in which the heart has trouble pumping blood. This means your heart does not pump blood efficiently for your body to work well. In some cases of heart failure, fluid may back up into your lungs or you may have swelling (edema) in your lower legs. Heart failure is usually a long-term (chronic) condition. It is important for you to take good care of yourself and follow your caregiver's treatment plan. °CAUSES  °Some health conditions can cause heart failure. Those health conditions include: °· High blood pressure (hypertension) causes the heart muscle to work harder than normal. When pressure in the blood vessels is high, the heart needs to pump (contract) with more force in order to circulate blood throughout the body. High blood pressure eventually causes the heart to become stiff and weak. °· Coronary artery disease (CAD) is the buildup of cholesterol and fat (plaque) in the arteries of the heart. The blockage in the arteries deprives the heart muscle of oxygen and blood. This can cause chest pain and may lead to a heart attack. High blood pressure can also contribute to CAD. °· Heart attack (myocardial infarction) occurs when 1 or more arteries in the heart become blocked. The loss of oxygen damages the muscle tissue of the heart. When this happens, part of the heart muscle dies. The injured tissue does not contract as well and weakens the heart's ability to pump blood. °· Abnormal heart valves can cause heart failure when the heart valves do not open and close properly. This makes the heart muscle pump harder to keep the blood flowing. °· Heart muscle disease (cardiomyopathy or myocarditis) is damage to the heart muscle from a variety of causes. These can include drug or alcohol abuse, infections, or unknown reasons. These can increase the risk of heart failure. °· Lung disease makes the heart work harder because the lungs do not work properly. This can cause a strain  on the heart, leading it to fail. °· Diabetes increases the risk of heart failure. High blood sugar contributes to high fat (lipid) levels in the blood. Diabetes can also cause slow damage to tiny blood vessels that carry important nutrients to the heart muscle. When the heart does not get enough oxygen and food, it can cause the heart to become weak and stiff. This leads to a heart that does not contract efficiently. °· Other conditions can contribute to heart failure. These include abnormal heart rhythms, thyroid problems, and low blood counts (anemia). °Certain unhealthy behaviors can increase the risk of heart failure. Those unhealthy behaviors include: °· Being overweight. °· Smoking or chewing tobacco. °· Eating foods high in fat and cholesterol. °· Abusing illicit drugs or alcohol. °· Lacking physical activity. °SYMPTOMS  °Heart failure symptoms may vary and can be hard to detect. Symptoms may include: °· Shortness of breath with activity, such as climbing stairs. °· Persistent cough. °· Swelling of the feet, ankles, legs, or abdomen. °· Unexplained weight gain. °· Difficulty breathing when lying flat (orthopnea). °· Waking from sleep because of the need to sit up and get more air. °· Rapid heartbeat. °· Fatigue and loss of energy. °· Feeling lightheaded, dizzy, or close to fainting. °· Loss of appetite. °· Nausea. °· Increased urination during the night (nocturia). °DIAGNOSIS  °A diagnosis of heart failure is based on your history, symptoms, physical examination, and diagnostic tests. °Diagnostic tests for heart failure may include: °· Echocardiography. °· Electrocardiography. °· Chest X-ray. °· Blood tests. °· Exercise   stress test. °· Cardiac angiography. °· Radionuclide scans. °TREATMENT  °Treatment is aimed at managing the symptoms of heart failure. Medicines, behavioral changes, or surgical intervention may be necessary to treat heart failure. °· Medicines to help treat heart failure may  include: °· Angiotensin-converting enzyme (ACE) inhibitors. This type of medicine blocks the effects of a blood protein called angiotensin-converting enzyme. ACE inhibitors relax (dilate) the blood vessels and help lower blood pressure. °· Angiotensin receptor blockers. This type of medicine blocks the actions of a blood protein called angiotensin. Angiotensin receptor blockers dilate the blood vessels and help lower blood pressure. °· Water pills (diuretics). Diuretics cause the kidneys to remove salt and water from the blood. The extra fluid is removed through urination. This loss of extra fluid lowers the volume of blood the heart pumps. °· Beta blockers. These prevent the heart from beating too fast and improve heart muscle strength. °· Digitalis. This increases the force of the heartbeat. °· Healthy behavior changes include: °· Obtaining and maintaining a healthy weight. °· Stopping smoking or chewing tobacco. °· Eating heart healthy foods. °· Limiting or avoiding alcohol. °· Stopping illicit drug use. °· Physical activity as directed by your caregiver. °· Surgical treatment for heart failure may include: °· A procedure to open blocked arteries, repair damaged heart valves, or remove damaged heart muscle tissue. °· A pacemaker to improve heart muscle function and control certain abnormal heart rhythms. °· An internal cardioverter defibrillator to treat certain serious abnormal heart rhythms. °· A left ventricular assist device to assist the pumping ability of the heart. °HOME CARE INSTRUCTIONS  °· Take your medicine as directed by your caregiver. Medicines are important in reducing the workload of your heart, slowing the progression of heart failure, and improving your symptoms. °· Do not stop taking your medicine unless directed by your caregiver. °· Do not skip any dose of medicine. °· Refill your prescriptions before you run out of medicine. Your medicines are needed every day. °· Take over-the-counter  medicine only as directed by your caregiver or pharmacist. °· Engage in moderate physical activity if directed by your caregiver. Moderate physical activity can benefit some people. The elderly and people with severe heart failure should consult with a caregiver for physical activity recommendations. °· Eat heart healthy foods. Food choices should be free of trans fat and low in saturated fat, cholesterol, and salt (sodium). Healthy choices include fresh or frozen fruits and vegetables, fish, lean meats, legumes, fat-free or low-fat dairy products, and whole grain or high fiber foods. Talk to a dietitian to learn more about heart healthy foods. °· Limit sodium if directed by your caregiver. Sodium restriction may reduce symptoms of heart failure in some people. Talk to a dietitian to learn more about heart healthy seasonings. °· Use healthy cooking methods. Healthy cooking methods include roasting, grilling, broiling, baking, poaching, steaming, or stir-frying. Talk to a dietitian to learn more about healthy cooking methods. °· Limit fluids if directed by your caregiver. Fluid restriction may reduce symptoms of heart failure in some people. °· Weigh yourself every day. Daily weights are important in the early recognition of excess fluid. You should weigh yourself every morning after you urinate and before you eat breakfast. Wear the same amount of clothing each time you weigh yourself. Record your daily weight. Provide your caregiver with your weight record. °· Monitor and record your blood pressure if directed by your caregiver. °· Check your pulse if directed by your caregiver. °· Lose weight if directed   by your caregiver. Weight loss may reduce symptoms of heart failure in some people. °· Stop smoking or chewing tobacco. Nicotine makes your heart work harder by causing your blood vessels to constrict. Do not use nicotine gum or patches before talking to your caregiver. °· Schedule and attend follow-up visits as  directed by your caregiver. It is important to keep all your appointments. °· Limit alcohol intake to no more than 1 drink per day for nonpregnant women and 2 drinks per day for men. Drinking more than that is harmful to your heart. Tell your caregiver if you drink alcohol several times a week. Talk with your caregiver about whether alcohol is safe for you. If your heart has already been damaged by alcohol or you have severe heart failure, drinking alcohol should be stopped completely. °· Stop illicit drug use. °· Stay up-to-date with immunizations. It is especially important to prevent respiratory infections through current pneumococcal and influenza immunizations. °· Manage other health conditions such as hypertension, diabetes, thyroid disease, or abnormal heart rhythms as directed by your caregiver. °· Learn to manage stress. °· Plan rest periods when fatigued. °· Learn strategies to manage high temperatures. If the weather is extremely hot: °· Avoid vigorous physical activity. °· Use air conditioning or fans or seek a cooler location. °· Avoid caffeine and alcohol. °· Wear loose-fitting, lightweight, and light-colored clothing. °· Learn strategies to manage cold temperatures. If the weather is extremely cold: °· Avoid vigorous physical activity. °· Layer clothes. °· Wear mittens or gloves, a hat, and a scarf when going outside. °· Avoid alcohol. °· Obtain ongoing education and support as needed. °· Participate or seek rehabilitation as needed to maintain or improve independence and quality of life. °SEEK MEDICAL CARE IF:  °· Your weight increases by 03 lb/1.4 kg in 1 day or 05 lb/2.3 kg in a week. °· You have increasing shortness of breath that is unusual for you. °· You are unable to participate in your usual physical activities. °· You tire easily. °· You cough more than normal, especially with physical activity. °· You have any or more swelling in areas such as your hands, feet, ankles, or abdomen. °· You  are unable to sleep because it is hard to breathe. °· You feel like your heart is beating fast (palpitations). °· You become dizzy or lightheaded upon standing up. °SEEK IMMEDIATE MEDICAL CARE IF:  °· You have difficulty breathing. °· There is a change in mental status such as decreased alertness or difficulty with concentration. °· You have a pain or discomfort in your chest. °· You have an episode of fainting (syncope). °MAKE SURE YOU:  °· Understand these instructions. °· Will watch your condition. °· Will get help right away if you are not doing well or get worse. °Document Released: 07/27/2005 Document Revised: 11/21/2012 Document Reviewed: 08/18/2012 °[employer]® Patient Information ©2014 [employer]. ° °

## 2013-07-04 NOTE — Progress Notes (Signed)
  Subjective:    Patient ID: [name], female    DOB: January 16, 1935, 78 y.o.   MRN: [id number]  HPI  Patient in last Tuesday and saw Dr. Ernestina Patches- was sent to hospital with CHF and atrial fib- was there for 2 days- patient says she feels much better today. NoSOB- NO cough- no edema.    Review of Systems  Constitutional: Negative.   HENT: Negative.   Eyes: Negative.   Respiratory: Negative for cough and shortness of breath.   Cardiovascular: Negative for chest pain, palpitations and leg swelling.  Endocrine: Negative.   Genitourinary: Negative.   Musculoskeletal: Negative.   Neurological: Negative.   Hematological: Negative.   Psychiatric/Behavioral: Negative.        Objective:   Physical Exam  Constitutional: She appears well-developed and well-nourished.  Cardiovascular: Normal rate, regular rhythm and intact distal pulses.   Murmur (2/6 systolic) heard. Pulmonary/Chest: Effort normal and breath sounds normal.  Abdominal: Soft. Bowel sounds are normal.  Musculoskeletal: Normal range of motion. She exhibits no edema.  Neurological: She is alert.  Skin: Skin is warm.  Psychiatric: She has a normal mood and affect. Her behavior is normal. Judgment and thought content normal.     BP 106/61  Pulse 75  Temp(Src) 98.5 F (36.9 C) (Oral)  Ht 5\' 1"  (1.549 m)  Wt 150 lb (68.04 kg)  BMI 28.36 kg/m2      Assessment & Plan:   1. Hospital discharge follow-up   2. CHF (congestive heart failure), chronic, combined   3. COPD exacerbation    No orders of the defined types were placed in this encounter.   Meds ordered this encounter  Medications  . cefUROXime (CEFTIN) 250 MG tablet    Sig:   . DISCONTD: ADVAIR DISKUS 250-50 MCG/DOSE AEPB    Sig:   . gabapentin (NEURONTIN) 100 MG capsule    Sig:    Hospital records reviewed Continue all meds Labs pending Diet and exercise encouraged Health maintenance reviewed Follow up in 3 months  Grant,  FNP

## 2013-07-10 ENCOUNTER — Other Ambulatory Visit: Payer: Self-pay | Admitting: Nurse Practitioner

## 2013-07-10 NOTE — Telephone Encounter (Signed)
PLEASE CALL PT. SHE NEEDS TO  FOLLOW A LOW FAT/DIABTEIC DIET. TAKE HER NEXIUM BID AND USE MAALOX OR MYLANTA AS NEEDED. SHE NEEDS TO ADD A PROBIOTIC DAILY([facility], [facility], OR ALIGN).

## 2013-07-10 NOTE — Telephone Encounter (Signed)
Called and informed pt.  

## 2013-07-10 NOTE — Telephone Encounter (Signed)
Pt called wanting to talk with nurse about her stomach, stomach is burning, pt's states her stomach feels gassy. Please advise [phone]

## 2013-07-10 NOTE — Telephone Encounter (Signed)
I called pt. She is having a lot of indigestion and gas. I asked her how has she been eating over the holidays. She had some ham, [place] and one day got her some chinese food although she said she did not eat much of it.  She has a lot of rolling in her stomach and she feels she might have diverticulitis again. She wants to know if Dr. Oneida Alar can give her some of the liquid medicine to coat her stomach. Please advise!

## 2013-07-11 ENCOUNTER — Other Ambulatory Visit: Payer: Self-pay

## 2013-07-11 MED ORDER — WARFARIN SODIUM 2 MG PO TABS: 1.0000 mg | ORAL_TABLET | Freq: Every day | ORAL | Status: DC

## 2013-07-12 ENCOUNTER — Other Ambulatory Visit: Payer: Self-pay | Admitting: Gastroenterology

## 2013-07-12 ENCOUNTER — Telehealth: Payer: Self-pay | Admitting: *Deleted

## 2013-07-12 DIAGNOSIS — R1319 Other dysphagia: Secondary | ICD-10-CM

## 2013-07-12 NOTE — Telephone Encounter (Signed)
Pt called stating the last 2 nights she feels like her pills are getting hung in her throat, and her throat is raw, pt states she has to drink a lot of drink to feel like the pills went down. Pt don't like the feeling of having pills get hung in her throat. Please advise [phone]

## 2013-07-12 NOTE — Telephone Encounter (Signed)
Pt is aware. Ok to schedule.

## 2013-07-12 NOTE — Telephone Encounter (Signed)
PLEASE CALL PT. SCHEDULE BARIUM PILL ESOPHAGRAM ASAP DX; DYSPHAGIA.

## 2013-07-12 NOTE — Telephone Encounter (Signed)
BPE is scheduled for Thurs Dec 4th at 8:30 and [name] is aware

## 2013-07-12 NOTE — Telephone Encounter (Signed)
I called pt. She said she has had difficulty swallowing pills for the last couple of nights, no matter what size pills.  Her stomach is rolling and quivering and she just does not feel well. ( she had two waffles with sugar free syrup this AM). She said her throat feels raw and she just does not like the feeling. She has not had any fever that she is aware of. Please advise!

## 2013-07-13 ENCOUNTER — Ambulatory Visit (INDEPENDENT_AMBULATORY_CARE_PROVIDER_SITE_OTHER): Payer: Medicare Other | Admitting: Pharmacist

## 2013-07-13 ENCOUNTER — Encounter: Payer: Self-pay | Admitting: Gastroenterology

## 2013-07-13 ENCOUNTER — Ambulatory Visit (HOSPITAL_COMMUNITY)
Admission: RE | Admit: 2013-07-13 | Discharge: 2013-07-13 | Disposition: A | Discharge status: Home / Self Care | Payer: Medicare Other | Source: Ambulatory Visit | Attending: Gastroenterology | Admitting: Gastroenterology

## 2013-07-13 ENCOUNTER — Ambulatory Visit (INDEPENDENT_AMBULATORY_CARE_PROVIDER_SITE_OTHER): Payer: Medicare Other | Admitting: Gastroenterology

## 2013-07-13 VITALS — BP 117/60 | HR 75 | Temp 99.0°F | Ht 63.0 in | Wt 152.8 lb

## 2013-07-13 DIAGNOSIS — K219 Gastro-esophageal reflux disease without esophagitis: Secondary | ICD-10-CM

## 2013-07-13 DIAGNOSIS — I4891 Unspecified atrial fibrillation: Secondary | ICD-10-CM | POA: Diagnosis not present

## 2013-07-13 DIAGNOSIS — Z7901 Long term (current) use of anticoagulants: Secondary | ICD-10-CM | POA: Diagnosis not present

## 2013-07-13 DIAGNOSIS — R1319 Other dysphagia: Secondary | ICD-10-CM

## 2013-07-13 DIAGNOSIS — E119 Type 2 diabetes mellitus without complications: Secondary | ICD-10-CM

## 2013-07-13 DIAGNOSIS — K224 Dyskinesia of esophagus: Secondary | ICD-10-CM | POA: Insufficient documentation

## 2013-07-13 DIAGNOSIS — R197 Diarrhea, unspecified: Secondary | ICD-10-CM | POA: Diagnosis not present

## 2013-07-13 DIAGNOSIS — R109 Unspecified abdominal pain: Secondary | ICD-10-CM | POA: Diagnosis not present

## 2013-07-13 DIAGNOSIS — R1310 Dysphagia, unspecified: Secondary | ICD-10-CM | POA: Diagnosis not present

## 2013-07-13 MED ORDER — OMEPRAZOLE 40 MG PO CPDR: 40.0000 mg | DELAYED_RELEASE_CAPSULE | Freq: Two times a day (BID) | ORAL | Status: DC

## 2013-07-13 MED ORDER — SUCRALFATE 1 GM/10ML PO SUSP: 1.0000 g | Freq: Three times a day (TID) | ORAL | Status: DC

## 2013-07-13 NOTE — Assessment & Plan Note (Addendum)
Complains of refractory burning in the center of her chest like heartburn. Feels like a lump in her throat. Symptoms present since recent hospitalization. Denies odynophagia, vomiting. Recently increased her omeprazole to 40 mg twice a day within the last week. Question reflux esophagitis flare, cannot exclude Candida esophagitis given recent antibiotic use/steroid use. Last EGD 2013 as outlined under past surgical history. Continue omeprazole 40 mg twice a day. Add Carafate 1 g 4 times a day for 2 weeks. She will call if no noted improvement or symptoms worsen.  F/u barium esophagram as available.

## 2013-07-13 NOTE — Patient Instructions (Signed)
1. Please collect stool as soon as possible. 2. Continue omeprazole twice a day before breakfast and evening meal for the next 30 days. I sent prescription to your pharmacy. 3. Add carafate four times daily before meals and at bedtime for two weeks. 4. Call if you have persistent burning in your chest. 5. If you have chest pain, difficulty breathing, sweating, go to the nearest ER.

## 2013-07-13 NOTE — Assessment & Plan Note (Signed)
Two-week history of diarrhea. Increased risk for C. difficile colitis given recent hospitalization and multiple rounds of antibiotics. Check C. difficile PCR.

## 2013-07-13 NOTE — Progress Notes (Signed)
Primary Care Physician: Redge Gainer, MD  Primary Gastroenterologist:  Barney Drain, MD   Chief Complaint  Patient presents with  . Diarrhea  . BURNING IN ESOPHAGUS    HPI: [name] is a 78 y.o. female here for further evaluation of diarrhea and burning in her esophagus. Hospitalized from November 18 to November 20 with COPD with acute exacerbation. Treated with steroids, Levaquin. Also recently has been on clindamycin and cefuroxime. Since D/C complains of diarrhea and bad heartburn. Feels like something sticking in throat area. Foods go down fine. Does not feel like a pill has stuck. Last couple of nights has had a lot of burning in the chest. No N/V. Able to eat. Multiple stools daily, some nocturnal. Pure water. Diarrhea worse last night. Laxative couple of days after got of the hospital for COPD exacerbation but none in two weeks. No melena, brbpr. No PND. Three bottles of Maalox this week. No odynophagia. Diffuse soreness in abdomen from diarrhea. [place] prilosec bid, recently went up. Having INR checked today. Denies fever or change in chronic DOE.    Barium esophagram done earlier today, results pending.  Weight September 2013, 130 pounds. Weight 06/01/2013, 153 pounds. Weight today, 152.8 pounds.  Current Outpatient Prescriptions  Medication Sig Dispense Refill  . albuterol (PROVENTIL HFA;VENTOLIN HFA) 108 (90 BASE) MCG/ACT inhaler Inhale 2 puffs into the lungs every 6 (six) hours as needed for wheezing.      . bisacodyl (DULCOLAX) 5 MG EC tablet Take 5 mg by mouth daily as needed for constipation.      . budesonide-formoterol (SYMBICORT) 160-4.5 MCG/ACT inhaler Inhale 2 puffs into the lungs 2 times daily at 12 noon and 4 pm.  1 Inhaler  2  . clonazePAM (KLONOPIN) 0.5 MG tablet Take 1 tablet (0.5 mg total) by mouth 3 (three) times daily as needed.  90 tablet  0  . diltiazem (CARDIZEM CD) 120 MG 24 hr capsule Take 1 capsule (120 mg total) by mouth daily.  30 capsule  11   . furosemide (LASIX) 40 MG tablet Take 1 tablet (40 mg total) by mouth daily.  30 tablet  1  . glyBURIDE (DIABETA) 5 MG tablet Take 2.5 mg by mouth daily with breakfast.       . guaiFENesin (MUCINEX) 600 MG 12 hr tablet Take 1 tablet (600 mg total) by mouth 2 (two) times daily.  30 tablet  0  . HYDROcodone-acetaminophen (NORCO/VICODIN) 5-325 MG per tablet Take 1 tablet by mouth 3 (three) times daily as needed. For pain  90 tablet  0  . metoprolol tartrate (LOPRESSOR) 25 MG tablet Take 1 tablet (25 mg total) by mouth 2 (two) times daily.  60 tablet  11  . omeprazole (PRILOSEC) 40 MG capsule Take 1 capsule (40 mg total) by mouth 2 (two) times daily before a meal.  60 capsule  0  . PROAIR HFA 108 (90 BASE) MCG/ACT inhaler USE 2 PUFFS BY MOUTH EVERY 3  TO 4 HOURS WHILE AWAKE AS NEEDED  9 each  1  . simvastatin (ZOCOR) 10 MG tablet Take 10 mg by mouth at bedtime.      [name] tiotropium (SPIRIVA) 18 MCG inhalation capsule Place 1 capsule (18 mcg total) into inhaler and inhale daily.  30 capsule  5  . warfarin (COUMADIN) 2 MG tablet Take 0.5-1 tablets (1-2 mg total) by mouth daily. Takes 2 mg on Monday, Wednesday and Friday and 1 mg all other days  45 tablet  2  . zafirlukast (  ACCOLATE) 20 MG tablet Take 20 mg by mouth 2 (two) times daily.      .         No current facility-administered medications for this visit.    Allergies as of 07/13/2013 - Review Complete 07/13/2013  Allergen Reaction Noted  . Torsemide Nausea And Vomiting 06/03/2012  . Tramadol Nausea Only 12/03/2010    ROS:  General: Negative for anorexia, weight loss, fever, chills, fatigue, weakness. ENT: Negative for hoarseness,  nasal congestion. See hpi. CV: Negative for chest pain, angina, palpitations, dyspnea on exertion, peripheral edema.  Respiratory: Negative for dyspnea at rest, dyspnea on exertion, cough, sputum, wheezing.  GI: See history of present illness. GU:  Negative for dysuria, hematuria, urinary incontinence, urinary  frequency, nocturnal urination.  Endo: Negative for unusual weight change.    Physical Examination:   BP 117/60  Pulse 75  Temp(Src) 99 F (37.2 C) (Oral)  Ht 5\' 3"  (1.6 m)  Wt 152 lb 12.8 oz (69.31 kg)  BMI 27.07 kg/m2  General: chronically ill-appearing WF in no acute distress.  Eyes: No icterus. Mouth: Oropharyngeal mucosa moist and pink , no lesions erythema or exudate. Edentulous. Lungs: Clear to auscultation bilaterally.  Heart: Regular rate and rhythm, no murmurs rubs or gallops.  Abdomen: Bowel sounds are normal, mild diffuse tenderness, nondistended, no hepatosplenomegaly or masses, no abdominal bruits or hernia , no rebound or guarding.   Extremities: No lower extremity edema. No clubbing or deformities. Neuro: Alert and oriented x 4   Skin: Warm and dry, no jaundice.   Psych: Alert and cooperative, normal mood and affect.  Labs:  Lab Results  Component Value Date   WBC 7.3 06/28/2013   HGB 11.0* 06/28/2013   HCT 32.8* 06/28/2013   MCV 98.2 06/28/2013   PLT 167 06/28/2013   Lab Results  Component Value Date   INR 2.50* 06/29/2013   INR 2.09* 06/28/2013   INR 2.02* 06/27/2013   Lab Results  Component Value Date   CREATININE 1.37* 06/29/2013   BUN 44* 06/29/2013   NA 135 06/29/2013   K 4.0 06/29/2013   CL 97 06/29/2013   CO2 26 06/29/2013   Lab Results  Component Value Date   ALT 10 06/27/2013   AST 19 06/27/2013   ALKPHOS 87 06/27/2013   BILITOT 0.7 06/27/2013    Imaging Studies: Dg Chest 2 View  06/27/2013   CLINICAL DATA:  Difficulty breathing, congestion  EXAM: CHEST  2 VIEW  COMPARISON:  Chest radiograph 05/11/2012  FINDINGS: Left-sided pacemaker overlies stable cardiac silhouette. There is increase in perihilar airspace disease compared to prior. There is a new small left effusion. No focal consolidation. No pneumothorax. Degenerative osteophytosis of the thoracic spine.  IMPRESSION: Perihilar airspace disease suggests pulmonary edema. Small  left effusion.   Electronically Signed   By: Suzy Bouchard M.D.   On: 06/27/2013 15:41

## 2013-07-13 NOTE — Patient Instructions (Signed)
Anticoagulation Dose Instructions as of 07/13/2013     [name] Tue Wed Thu Fri Sat   New Dose 1 mg 2 mg 1 mg 1 mg 1 mg 2 mg 1 mg    Description       No warfarin today 07/13/13 or tomorrow 07/14/2013, then start new dose of 1/2 tablet daily except 1 tablet on Monday and Fridays.      INR was 4.6 today

## 2013-07-14 ENCOUNTER — Other Ambulatory Visit: Payer: Self-pay | Admitting: Gastroenterology

## 2013-07-14 ENCOUNTER — Telehealth: Payer: Self-pay | Admitting: Nurse Practitioner

## 2013-07-14 LAB — CLOSTRIDIUM DIFFICILE BY PCR: Toxigenic C. Difficile by PCR: DETECTED — CR

## 2013-07-14 MED ORDER — FAMOTIDINE 20 MG PO TABS: 20.0000 mg | ORAL_TABLET | Freq: Two times a day (BID) | ORAL | Status: DC

## 2013-07-14 MED ORDER — METRONIDAZOLE 500 MG PO TABS: 500.0000 mg | ORAL_TABLET | Freq: Three times a day (TID) | ORAL | Status: DC

## 2013-07-14 NOTE — Telephone Encounter (Signed)
Hold until monday

## 2013-07-14 NOTE — Telephone Encounter (Signed)
Called patient and told her to take 1/2 of regular dose until monday

## 2013-07-17 ENCOUNTER — Encounter: Payer: Medicare Other | Admitting: Nurse Practitioner

## 2013-07-17 ENCOUNTER — Ambulatory Visit (INDEPENDENT_AMBULATORY_CARE_PROVIDER_SITE_OTHER): Payer: Medicare Other | Admitting: Pharmacist

## 2013-07-17 DIAGNOSIS — I4891 Unspecified atrial fibrillation: Secondary | ICD-10-CM

## 2013-07-17 LAB — POCT INR: INR: 1.9

## 2013-07-17 NOTE — Patient Instructions (Signed)
Anticoagulation Dose Instructions as of 07/17/2013     [name] Tue Wed Thu Fri Sat   New Dose 1 mg 1 mg 1 mg 1 mg 1 mg 1 mg 1 mg    Description       Take 1/2 tablet daily until finished with flagyl/metronidazole and until next protime check next week.      INR was 1.9 today

## 2013-07-17 NOTE — Progress Notes (Signed)
   Subjective:    Patient ID: [name], female    DOB: March 10, 1935, 78 y.o.   MRN: [id number]  HPI    Review of Systems     Objective:   Physical Exam        Assessment & Plan:  Saw clinical pharmacist for INR

## 2013-07-17 NOTE — Telephone Encounter (Signed)
Patient aware and has an appt today with mmm

## 2013-07-17 NOTE — Progress Notes (Signed)
cc'd to pcp 

## 2013-07-18 NOTE — Progress Notes (Signed)
REVIEWED.  

## 2013-07-19 NOTE — Progress Notes (Signed)
Quick Note:  I called to check on pt. She said she is doing some better. No diarrhea now. Not really having abdominal pain, but still having some indigestion. She had her INR checked mon and it was 1.9 and she was told to take 1/2 of her 2 mg coumadin daily. She will call if she has problems or questions. ______

## 2013-07-24 ENCOUNTER — Ambulatory Visit (INDEPENDENT_AMBULATORY_CARE_PROVIDER_SITE_OTHER): Payer: Medicare Other | Admitting: Pharmacist

## 2013-07-24 ENCOUNTER — Encounter: Payer: Self-pay | Admitting: Gastroenterology

## 2013-07-24 DIAGNOSIS — I4891 Unspecified atrial fibrillation: Secondary | ICD-10-CM | POA: Diagnosis not present

## 2013-07-24 NOTE — Progress Notes (Signed)
Called and informed pt.  

## 2013-07-24 NOTE — Progress Notes (Signed)
PLEASE CALL PT. HER BARIUM SWALLOW SHOWS SHE HAS A WEAK ESOPHAGUS MOST LIKELY DUE TO AGE. SHE DOES NOT HAVE A STRICTURE. FOLLOW A LOW FAT/SOFT MECHANICAL DIET. CONTINUE PROTONIX. TAKE 30 MINUTES PRIOR TO MEALS TWICE DAILY. OPV IN FEB 2015 E30 C DIFF COLITIS/DYSPEPSIA LL OR SLF.

## 2013-07-24 NOTE — Patient Instructions (Signed)
Anticoagulation Dose Instructions as of 07/24/2013     Dorene Grebe Tue Wed Thu Fri Sat   New Dose 1 mg 1 mg 1 mg 1 mg 1 mg 1 mg 1 mg    Description       No warfarin today or tomorrow.  Then restart 1/2 tablet daily until finished with flagyl/metronidazole and until next protime      INR was 3.9 today

## 2013-07-25 ENCOUNTER — Telehealth: Payer: Self-pay | Admitting: Nurse Practitioner

## 2013-07-25 ENCOUNTER — Other Ambulatory Visit: Payer: Self-pay | Admitting: Family Medicine

## 2013-07-25 MED ORDER — HYDROCODONE-ACETAMINOPHEN 5-325 MG PO TABS: 1.0000 | ORAL_TABLET | Freq: Three times a day (TID) | ORAL | Status: DC | PRN

## 2013-07-25 NOTE — Telephone Encounter (Signed)
Patient aware rx up front 

## 2013-07-25 NOTE — Telephone Encounter (Signed)
rx ready for pickup 

## 2013-07-26 NOTE — Progress Notes (Signed)
Pt is aware of OV on 2/16 at 11 with LSL and appt card was mailed

## 2013-07-27 DIAGNOSIS — L851 Acquired keratosis [keratoderma] palmaris et plantaris: Secondary | ICD-10-CM | POA: Diagnosis not present

## 2013-07-27 DIAGNOSIS — B351 Tinea unguium: Secondary | ICD-10-CM | POA: Diagnosis not present

## 2013-07-27 DIAGNOSIS — E1149 Type 2 diabetes mellitus with other diabetic neurological complication: Secondary | ICD-10-CM | POA: Diagnosis not present

## 2013-07-30 ENCOUNTER — Other Ambulatory Visit: Payer: Self-pay | Admitting: Family Medicine

## 2013-07-31 ENCOUNTER — Ambulatory Visit (INDEPENDENT_AMBULATORY_CARE_PROVIDER_SITE_OTHER): Payer: Medicare Other | Admitting: Pharmacist

## 2013-07-31 DIAGNOSIS — I4891 Unspecified atrial fibrillation: Secondary | ICD-10-CM

## 2013-07-31 NOTE — Patient Instructions (Signed)
Anticoagulation Dose Instructions as of 07/31/2013     [name] Tue Wed Thu Fri Sat   New Dose 1 mg 1 mg 1 mg 2 mg 1 mg 1 mg 1 mg    Description       Start 1 tablet on wednesdays and 1/2 tablet all others. (since finished metronidazole)       INR was 2.5 today

## 2013-08-01 NOTE — Telephone Encounter (Signed)
Not on med list

## 2013-08-04 ENCOUNTER — Encounter: Payer: Medicare Other | Admitting: Cardiovascular Disease

## 2013-08-07 ENCOUNTER — Other Ambulatory Visit: Payer: Self-pay | Admitting: *Deleted

## 2013-08-07 NOTE — Telephone Encounter (Signed)
Last filled 07/04/13, last seen 07/04/13 also. Route to pool B

## 2013-08-08 MED ORDER — CLONAZEPAM 0.5 MG PO TABS: 0.5000 mg | ORAL_TABLET | Freq: Three times a day (TID) | ORAL | Status: DC | PRN

## 2013-08-08 NOTE — Telephone Encounter (Signed)
Please call in klonopin rx with 1 refill

## 2013-08-08 NOTE — Telephone Encounter (Signed)
Called in.

## 2013-08-09 ENCOUNTER — Other Ambulatory Visit: Payer: Self-pay | Admitting: Gastroenterology

## 2013-08-09 DIAGNOSIS — I129 Hypertensive chronic kidney disease with stage 1 through stage 4 chronic kidney disease, or unspecified chronic kidney disease: Secondary | ICD-10-CM

## 2013-08-09 DIAGNOSIS — I2510 Atherosclerotic heart disease of native coronary artery without angina pectoris: Secondary | ICD-10-CM | POA: Diagnosis not present

## 2013-08-09 DIAGNOSIS — Z950 Presence of cardiac pacemaker: Secondary | ICD-10-CM

## 2013-08-09 DIAGNOSIS — N182 Chronic kidney disease, stage 2 (mild): Secondary | ICD-10-CM | POA: Diagnosis not present

## 2013-08-09 DIAGNOSIS — I4891 Unspecified atrial fibrillation: Secondary | ICD-10-CM | POA: Diagnosis not present

## 2013-08-11 ENCOUNTER — Ambulatory Visit (INDEPENDENT_AMBULATORY_CARE_PROVIDER_SITE_OTHER): Payer: Medicare Other | Admitting: Pharmacist

## 2013-08-11 DIAGNOSIS — I4891 Unspecified atrial fibrillation: Secondary | ICD-10-CM

## 2013-08-11 LAB — POCT INR: INR: 2.3

## 2013-08-11 NOTE — Patient Instructions (Signed)
Anticoagulation Dose Instructions as of 08/11/2013     [name] Tue Wed Thu Fri Sat   New Dose 1 mg 1 mg 1 mg 2 mg 1 mg 1 mg 1 mg    Description        Continue warfarin 1 tablet on wednesdays and 1/2 tablet all others.       INR was 2.3 today.

## 2013-08-21 ENCOUNTER — Emergency Department (HOSPITAL_COMMUNITY)
Admission: EM | Admit: 2013-08-21 | Discharge: 2013-08-21 | Disposition: A | Discharge status: Home / Self Care | Payer: Medicare Other | Attending: Emergency Medicine | Admitting: Emergency Medicine

## 2013-08-21 ENCOUNTER — Encounter (HOSPITAL_COMMUNITY): Payer: Self-pay | Admitting: Emergency Medicine

## 2013-08-21 ENCOUNTER — Telehealth: Payer: Self-pay | Admitting: Nurse Practitioner

## 2013-08-21 ENCOUNTER — Other Ambulatory Visit: Payer: Self-pay | Admitting: Nurse Practitioner

## 2013-08-21 ENCOUNTER — Telehealth: Payer: Self-pay

## 2013-08-21 DIAGNOSIS — E119 Type 2 diabetes mellitus without complications: Secondary | ICD-10-CM | POA: Diagnosis not present

## 2013-08-21 DIAGNOSIS — I129 Hypertensive chronic kidney disease with stage 1 through stage 4 chronic kidney disease, or unspecified chronic kidney disease: Secondary | ICD-10-CM | POA: Diagnosis not present

## 2013-08-21 DIAGNOSIS — Z79899 Other long term (current) drug therapy: Secondary | ICD-10-CM | POA: Insufficient documentation

## 2013-08-21 DIAGNOSIS — K219 Gastro-esophageal reflux disease without esophagitis: Secondary | ICD-10-CM | POA: Insufficient documentation

## 2013-08-21 DIAGNOSIS — I509 Heart failure, unspecified: Secondary | ICD-10-CM | POA: Insufficient documentation

## 2013-08-21 DIAGNOSIS — R197 Diarrhea, unspecified: Secondary | ICD-10-CM | POA: Diagnosis not present

## 2013-08-21 DIAGNOSIS — G4730 Sleep apnea, unspecified: Secondary | ICD-10-CM | POA: Diagnosis not present

## 2013-08-21 DIAGNOSIS — F17200 Nicotine dependence, unspecified, uncomplicated: Secondary | ICD-10-CM | POA: Insufficient documentation

## 2013-08-21 DIAGNOSIS — J441 Chronic obstructive pulmonary disease with (acute) exacerbation: Secondary | ICD-10-CM | POA: Insufficient documentation

## 2013-08-21 DIAGNOSIS — Z7901 Long term (current) use of anticoagulants: Secondary | ICD-10-CM | POA: Diagnosis not present

## 2013-08-21 DIAGNOSIS — Z792 Long term (current) use of antibiotics: Secondary | ICD-10-CM | POA: Diagnosis not present

## 2013-08-21 DIAGNOSIS — Z950 Presence of cardiac pacemaker: Secondary | ICD-10-CM | POA: Diagnosis not present

## 2013-08-21 DIAGNOSIS — N182 Chronic kidney disease, stage 2 (mild): Secondary | ICD-10-CM | POA: Diagnosis not present

## 2013-08-21 DIAGNOSIS — A0472 Enterocolitis due to Clostridium difficile, not specified as recurrent: Secondary | ICD-10-CM

## 2013-08-21 DIAGNOSIS — I4891 Unspecified atrial fibrillation: Secondary | ICD-10-CM | POA: Diagnosis not present

## 2013-08-21 DIAGNOSIS — Z8669 Personal history of other diseases of the nervous system and sense organs: Secondary | ICD-10-CM | POA: Diagnosis not present

## 2013-08-21 DIAGNOSIS — E7800 Pure hypercholesterolemia, unspecified: Secondary | ICD-10-CM | POA: Diagnosis not present

## 2013-08-21 DIAGNOSIS — R1033 Periumbilical pain: Secondary | ICD-10-CM | POA: Diagnosis not present

## 2013-08-21 DIAGNOSIS — N390 Urinary tract infection, site not specified: Secondary | ICD-10-CM | POA: Insufficient documentation

## 2013-08-21 DIAGNOSIS — K2970 Gastritis, unspecified, without bleeding: Secondary | ICD-10-CM | POA: Diagnosis not present

## 2013-08-21 DIAGNOSIS — Z862 Personal history of diseases of the blood and blood-forming organs and certain disorders involving the immune mechanism: Secondary | ICD-10-CM | POA: Diagnosis not present

## 2013-08-21 DIAGNOSIS — I2510 Atherosclerotic heart disease of native coronary artery without angina pectoris: Secondary | ICD-10-CM | POA: Insufficient documentation

## 2013-08-21 LAB — URINE MICROSCOPIC-ADD ON

## 2013-08-21 LAB — URINALYSIS, ROUTINE W REFLEX MICROSCOPIC
Bilirubin Urine: NEGATIVE
Glucose, UA: NEGATIVE mg/dL
Ketones, ur: NEGATIVE mg/dL
Nitrite: POSITIVE — AB
Specific Gravity, Urine: 1.015 (ref 1.005–1.030)
Urobilinogen, UA: 0.2 mg/dL (ref 0.0–1.0)
pH: 7 (ref 5.0–8.0)

## 2013-08-21 LAB — COMPREHENSIVE METABOLIC PANEL
ALT: 10 U/L (ref 0–35)
AST: 21 U/L (ref 0–37)
Albumin: 3.8 g/dL (ref 3.5–5.2)
Alkaline Phosphatase: 93 U/L (ref 39–117)
BUN: 27 mg/dL — ABNORMAL HIGH (ref 6–23)
CALCIUM: 9.8 mg/dL (ref 8.4–10.5)
CO2: 31 mEq/L (ref 19–32)
Chloride: 100 mEq/L (ref 96–112)
Creatinine, Ser: 1.72 mg/dL — ABNORMAL HIGH (ref 0.50–1.10)
GFR calc Af Amer: 32 mL/min — ABNORMAL LOW (ref >90)
GFR calc non Af Amer: 28 mL/min — ABNORMAL LOW (ref >90)
GLUCOSE: 81 mg/dL (ref 70–99)
Potassium: 3.8 mEq/L (ref 3.7–5.3)
SODIUM: 143 meq/L (ref 137–147)
TOTAL PROTEIN: 7.4 g/dL (ref 6.0–8.3)
Total Bilirubin: 0.3 mg/dL (ref 0.3–1.2)

## 2013-08-21 LAB — CBC
HCT: 37.6 % (ref 36.0–46.0)
Hemoglobin: 12.7 g/dL (ref 12.0–15.0)
MCH: 34.2 pg — AB (ref 26.0–34.0)
MCHC: 33.8 g/dL (ref 30.0–36.0)
MCV: 101.3 fL — AB (ref 78.0–100.0)
PLATELETS: 203 10*3/uL (ref 150–400)
RBC: 3.71 MIL/uL — AB (ref 3.87–5.11)
RDW: 13.4 % (ref 11.5–15.5)
WBC: 10.1 10*3/uL (ref 4.0–10.5)

## 2013-08-21 LAB — CLOSTRIDIUM DIFFICILE BY PCR: Toxigenic C. Difficile by PCR: NEGATIVE

## 2013-08-21 LAB — GLUCOSE, CAPILLARY
Glucose-Capillary: 49 mg/dL — ABNORMAL LOW (ref 70–99)
Glucose-Capillary: 71 mg/dL (ref 70–99)

## 2013-08-21 MED ORDER — ONDANSETRON HCL 4 MG/2ML IJ SOLN: 4.0000 mg | Freq: Once | INTRAMUSCULAR | Status: DC

## 2013-08-21 MED ORDER — CEPHALEXIN 500 MG PO CAPS
500.0000 mg | ORAL_CAPSULE | Freq: Once | ORAL | Status: AC
  Administered 2013-08-21: 500 mg via ORAL
  Filled 2013-08-21: qty 1

## 2013-08-21 MED ORDER — SODIUM CHLORIDE 0.9 % IV SOLN
INTRAVENOUS | Status: DC
Start: 2013-08-21 — End: 2013-08-22
  Administered 2013-08-21: 18:00:00 via INTRAVENOUS

## 2013-08-21 MED ORDER — CEPHALEXIN 500 MG PO CAPS: 500.0000 mg | ORAL_CAPSULE | Freq: Four times a day (QID) | ORAL | Status: DC

## 2013-08-21 NOTE — Telephone Encounter (Signed)
I spoke with pt and she decided to go to er to be seen for bronchitis and abdominal pain.

## 2013-08-21 NOTE — Telephone Encounter (Signed)
Pt is aware of what LSL said. She has a sample but has not way to get it to the hospital. She is going to see if her son-in-law will take it to the lab in [place].

## 2013-08-21 NOTE — ED Notes (Signed)
Pt c/o not feeling well. Pt cbg-49. OJ with sugar packet given.

## 2013-08-21 NOTE — ED Notes (Signed)
Dressed, eating meal and awaiting a ride to come pick her up

## 2013-08-21 NOTE — Telephone Encounter (Signed)
Pt called today with abd pain. She has diarrhea all night long and it is nothing but water.Has not ate anything this morning. Please advise

## 2013-08-21 NOTE — Telephone Encounter (Signed)
Needs to be checked for recurrent C.Diff. Please have her collect C.Diff PCR ASAP! If she returns to hospital lab we can get result faster.  She needs to stay hydrated, drinking plenty of fluids. Avoid dairy for right now. Eat bland foods.  If diarrhea, abdominal pain worsen, go to ER.

## 2013-08-21 NOTE — ED Notes (Signed)
Pt states abdominal pain and diarrhea began last night. NAD. Denies vomiting.

## 2013-08-21 NOTE — ED Notes (Signed)
Patient calling her son now to transport her home - ready for discharge.  Meal given.

## 2013-08-21 NOTE — ED Provider Notes (Signed)
CSN: [id number]     Arrival date & time 08/21/13  1530 History  This chart was scribed for [name] Blade, MD by Zettie Pho, ED Scribe. This patient was seen in room APA15/APA15 and the patient's care was started at 5:17 PM.    Chief Complaint  Patient presents with  . Abdominal Pain   The history is provided by the patient. No language interpreter was used.   HPI Comments: [name] is a 79 y.o. female with a history of GERD, diverticulitis, and gastritis who presents to the Emergency Department complaining of an intermittent pain to the periumbilical region of the abdomen onset a few days ago that progressively worsened to a constant pain today. She reports associated, multiple episodes (approximately 10) of watery diarrhea onset last night, about 4-5 days after the abdominal pain presented. Patient states that she has been tolerating solids and fluids well and has had a normal appetite, but has not eaten much today. She reports that she followed up with her gastroenterologist, who expressed concern for C. difficile infection. She denies fever, cough, chest pain, shortness of breath, dysuria, dizziness, weakness. Patient also has a history of CAD, DM, chronic stage II, kidney disease, CHF, hypercholesterolemia, and rheumatic heart disease. Patient is a current everyday smoker, 0.5 PPD.   Past Medical History  Diagnosis Date  . Permanent atrial fibrillation     H/o tachybradycardia syndrome on Coumadin anticoagulation, has pacemaker.  [name] CAD (coronary artery disease) 2011    Catheterization August 2011: mild nonobstructive coronary artery disease complicated by large left rectus sheath hematoma status post evacuation Coumadin restarted without recurrence  . Diabetes mellitus   . Chronic airway obstruction, not elsewhere classified   . Anemia, unspecified   . Chronic kidney disease, stage II (mild)   . Unspecified hypertensive kidney disease with chronic kidney disease stage I through  stage IV, or unspecified   . Tachycardia-bradycardia     s/p St. Jude single chamber PPM 10/2010   . GERD (gastroesophageal reflux disease)   . Abnormal CT of the chest     Mediastinal and hilar adenopathy followed by Dr. Koleen Nimrod in [place]  . Congestive heart failure, unspecified     EF now 60%, previous nonischemic cardiomyopathy  . Pacemaker     Single chamber [place]. Jude ACCENT [phone] SN 719 04/11/1959 10/31/2010  . Valvular heart disease     a. H/o moderate mitral stenosis by cath 2011 but no mention of this on 10/2011 echo. b. 10/2011 echo: mod MR, mild AS, mild TR; EF>65%  . Hypercholesterolemia   . Diverticulitis Dec 2012  . Body mass index (BMI) of 20.0-20.9 in adult FEB 2013 147 LBS  . Recurrent UTI   . Neurogenic sleep apnea     Uses noctural oxygen prescribed by her pulmonologi  . Rheumatic heart disease   . Gastritis     By EGD  . Carotid artery disease     Doppler, 05/2012, 0-39% bilateral  . Vitamin D deficiency   . Dilated bile duct 10/19/2011   Past Surgical History  Procedure Laterality Date  . Single-chamber pacemaker implantation  3/12    by Dr Caryl Comes  . Evacuation of retroperitoneal and left rectus sheath    . Cholecystectomy      40+ years ago  . Hernia repair      X3  . Pilonidal cyst / sinus excision    . Esophagogastroduodenoscopy  10/2011    Fields-erosive gastritis, biopsy with no H. pylori, hiatal hernia,  peptic duodenitis, no celiac disease, duodenal diverticulum, duodenal nodule  . Colonoscopy  10/2011    Dyann Ruddle sigmoid to descending diverticulosis, internal hemorrhoids  . Eus  11/16/11    Mishra-13 MM CBD, normal pancreatic duct, otherwise normal EUS   Family History  Problem Relation Age of Onset  . Cancer Mother     Uterus  . Cancer Daughter     kidney cancer, in remission  . Heart disease Father   . Colon cancer Neg Hx    History  Substance Use Topics  . Smoking status: Current Every Day Smoker -- 0.50 packs/day for 45 years    Types:  Cigarettes    Last Attempt to Quit: 08/10/2006  . Smokeless tobacco: Never Used     Comment: smokes 2-3 cigarettes daily  . Alcohol Use: No   OB History   Grav Para Term Preterm Abortions TAB SAB Ect Mult Living                 Review of Systems  Constitutional: Negative for fever and appetite change.  Respiratory: Negative for cough and shortness of breath.   Cardiovascular: Negative for chest pain.  Gastrointestinal: Positive for abdominal pain and diarrhea.  Genitourinary: Negative for dysuria.  Neurological: Negative for dizziness and weakness.  All other systems reviewed and are negative.    Allergies  Torsemide and Tramadol  Home Medications   Current Outpatient Rx  Name  Route  Sig  Dispense  Refill  . albuterol (PROVENTIL HFA;VENTOLIN HFA) 108 (90 BASE) MCG/ACT inhaler   Inhalation   Inhale 2 puffs into the lungs every 6 (six) hours as needed for wheezing.         . bisacodyl (DULCOLAX) 5 MG EC tablet   Oral   Take 5 mg by mouth daily as needed for constipation.         . budesonide-formoterol (SYMBICORT) 160-4.5 MCG/ACT inhaler   Inhalation   Inhale 2 puffs into the lungs 2 (two) times daily.         . clonazePAM (KLONOPIN) 0.5 MG tablet   Oral   Take 0.5 mg by mouth 3 (three) times daily as needed for anxiety.         [name] diltiazem (CARDIZEM SR) 120 MG 12 hr capsule   Oral   Take 120 mg by mouth every morning.         . furosemide (LASIX) 40 MG tablet   Oral   Take 1 tablet (40 mg total) by mouth daily.   30 tablet   1   . glyBURIDE (DIABETA) 5 MG tablet   Oral   Take 2.5 mg by mouth daily with breakfast.          . HYDROcodone-acetaminophen (NORCO/VICODIN) 5-325 MG per tablet   Oral   Take 1 tablet by mouth 3 (three) times daily as needed for moderate pain.         . metoprolol tartrate (LOPRESSOR) 25 MG tablet   Oral   Take 1 tablet (25 mg total) by mouth 2 (two) times daily.   60 tablet   11   . omeprazole (PRILOSEC) 40 MG  capsule   Oral   Take 40 mg by mouth 2 (two) times daily.         . simvastatin (ZOCOR) 10 MG tablet   Oral   Take 10 mg by mouth at bedtime.         . sucralfate (CARAFATE) 1 GM/10ML suspension   Oral  Take 10 mLs (1 g total) by mouth 4 (four) times daily -  with meals and at bedtime.   420 mL   0   . tiotropium (SPIRIVA) 18 MCG inhalation capsule   Inhalation   Place 18 mcg into inhaler and inhale at bedtime.         [name] warfarin (COUMADIN) 2 MG tablet   Oral   Take 1-2 mg by mouth See admin instructions. Takes one tablet (2 mg total) on Wednesdays, then take one-half tablet (1mg  total) on all other days         . zafirlukast (ACCOLATE) 20 MG tablet   Oral   Take 20 mg by mouth 2 (two) times daily.         . cephALEXin (KEFLEX) 500 MG capsule   Oral   Take 1 capsule (500 mg total) by mouth 4 (four) times daily.   28 capsule   0    BP 106/67  Pulse 65  Temp(Src) 98.7 F (37.1 C) (Oral)  Resp 16  Ht 5\' 3"  (1.6 m)  Wt 146 lb (66.225 kg)  BMI 25.87 kg/m2  SpO2 98%  Physical Exam  Nursing note and vitals reviewed. Constitutional: She is oriented to person, place, and time. She appears well-developed and well-nourished.  HENT:  Head: Normocephalic and atraumatic.  Mouth/Throat: Oropharynx is clear and moist.  Eyes: Conjunctivae and EOM are normal. Pupils are equal, round, and reactive to light.  Neck: Normal range of motion and phonation normal. Neck supple.  Cardiovascular: Normal rate, regular rhythm and intact distal pulses.   Pulmonary/Chest: Effort normal. She has wheezes. She has no rales. She exhibits no tenderness.  Decreased air movement with scattered wheezes. No rales or rhonchi.   Abdominal: Soft. She exhibits no distension and no mass. There is tenderness. There is no rebound and no guarding.  Mild epigastric tenderness to palpation.   Genitourinary:  Small amount of brown stool in the rectal vault. Small, palpable internal hemorrhoids.    Musculoskeletal: Normal range of motion.  Neurological: She is alert and oriented to person, place, and time. She exhibits normal muscle tone.  Skin: Skin is warm and dry.  Psychiatric: She has a normal mood and affect. Her behavior is normal. Judgment and thought content normal.    ED Course  Procedures (including critical care time)  DIAGNOSTIC STUDIES: Oxygen Saturation is 98% on room air, normal by my interpretation.    COORDINATION OF CARE: 5:25 PM- Ordered blood labs and UA. Will order IV fluids to manage symptoms. Discussed treatment plan with patient at bedside and patient verbalized agreement.   9:24 PM- Discussed that results do not indicate C difficile infection, but that symptoms are due to a UTI. Will discharge patient with Keflex. Discussed treatment plan with patient at bedside and patient verbalized agreement.   Medications  0.9 %  sodium chloride infusion ( Intravenous Stopped 08/21/13 2126)  cephALEXin (KEFLEX) capsule 500 mg (500 mg Oral Given 08/21/13 2044)   Results for orders placed during the hospital encounter of 08/21/13  CLOSTRIDIUM DIFFICILE BY PCR      Result Value Range   C difficile by pcr NEGATIVE  NEGATIVE  CBC      Result Value Range   WBC 10.1  4.0 - 10.5 K/uL   RBC 3.71 (*) 3.87 - 5.11 MIL/uL   Hemoglobin 12.7  12.0 - 15.0 g/dL   HCT 37.6  36.0 - 46.0 %   MCV 101.3 (*) 78.0 - 100.0 fL  MCH 34.2 (*) 26.0 - 34.0 pg   MCHC 33.8  30.0 - 36.0 g/dL   RDW 13.4  11.5 - 15.5 %   Platelets 203  150 - 400 K/uL  COMPREHENSIVE METABOLIC PANEL      Result Value Range   Sodium 143  137 - 147 mEq/L   Potassium 3.8  3.7 - 5.3 mEq/L   Chloride 100  96 - 112 mEq/L   CO2 31  19 - 32 mEq/L   Glucose, Bld 81  70 - 99 mg/dL   BUN 27 (*) 6 - 23 mg/dL   Creatinine, Ser 1.72 (*) 0.50 - 1.10 mg/dL   Calcium 9.8  8.4 - 10.5 mg/dL   Total Protein 7.4  6.0 - 8.3 g/dL   Albumin 3.8  3.5 - 5.2 g/dL   AST 21  0 - 37 U/L   ALT 10  0 - 35 U/L   Alkaline Phosphatase  93  39 - 117 U/L   Total Bilirubin 0.3  0.3 - 1.2 mg/dL   GFR calc non Af Amer 28 (*) >90 mL/min   GFR calc Af Amer 32 (*) >90 mL/min  URINALYSIS, ROUTINE W REFLEX MICROSCOPIC      Result Value Range   Color, Urine YELLOW  YELLOW   APPearance CLEAR  CLEAR   Specific Gravity, Urine 1.015  1.005 - 1.030   pH 7.0  5.0 - 8.0   Glucose, UA NEGATIVE  NEGATIVE mg/dL   Hgb urine dipstick SMALL (*) NEGATIVE   Bilirubin Urine NEGATIVE  NEGATIVE   Ketones, ur NEGATIVE  NEGATIVE mg/dL   Protein, ur TRACE (*) NEGATIVE mg/dL   Urobilinogen, UA 0.2  0.0 - 1.0 mg/dL   Nitrite POSITIVE (*) NEGATIVE   Leukocytes, UA MODERATE (*) NEGATIVE  GLUCOSE, CAPILLARY      Result Value Range   Glucose-Capillary 49 (*) 70 - 99 mg/dL  GLUCOSE, CAPILLARY      Result Value Range   Glucose-Capillary 71  70 - 99 mg/dL  URINE MICROSCOPIC-ADD ON      Result Value Range   Squamous Epithelial / LPF FEW (*) RARE   WBC, UA TOO NUMEROUS TO COUNT  <3 WBC/hpf   RBC / HPF 3-6  <3 RBC/hpf   Bacteria, UA MANY (*) RARE    EKG Interpretation   None       MDM   1. Urinary tract infection   2. Diarrhea    Nonspecific diarrhea with evidence for urinary tract infection. C. difficile toxin testing was negative. She feels better after treatment in stable for discharge.   Nursing Notes Reviewed/ Care Coordinated Applicable Imaging Reviewed Interpretation of Laboratory Data incorporated into ED treatment  The patient appears reasonably screened and/or stabilized for discharge and I doubt any other medical condition or other [facility] requiring further screening, evaluation, or treatment in the ED at this time prior to discharge.  Plan: Home Medications- Keflex; Home Treatments- rest, fluids; return here if the recommended treatment, does not improve the symptoms; Recommended follow up- PCP, one week for checkup   I personally performed the services described in this documentation, which was scribed in my presence. The  recorded information has been reviewed and is accurate.      [name] Blade, MD 08/22/13 [id number]

## 2013-08-21 NOTE — Discharge Instructions (Signed)
Drink plenty of fluids. Start the antibiotic, in the morning. You can use Imodium, if needed, for diarrhea.    Urinary Tract Infection Urinary tract infections (UTIs) can develop anywhere along your urinary tract. Your urinary tract is your body's drainage system for removing wastes and extra water. Your urinary tract includes two kidneys, two ureters, a bladder, and a urethra. Your kidneys are a pair of bean-shaped organs. Each kidney is about the size of your fist. They are located below your ribs, one on each side of your spine. CAUSES Infections are caused by microbes, which are microscopic organisms, including fungi, viruses, and bacteria. These organisms are so small that they can only be seen through a microscope. Bacteria are the microbes that most commonly cause UTIs. SYMPTOMS  Symptoms of UTIs may vary by age and gender of the patient and by the location of the infection. Symptoms in young women typically include a frequent and intense urge to urinate and a painful, burning feeling in the bladder or urethra during urination. Older women and men are more likely to be tired, shaky, and weak and have muscle aches and abdominal pain. A fever may mean the infection is in your kidneys. Other symptoms of a kidney infection include pain in your back or sides below the ribs, nausea, and vomiting. DIAGNOSIS To diagnose a UTI, your caregiver will ask you about your symptoms. Your caregiver also will ask to provide a urine sample. The urine sample will be tested for bacteria and white blood cells. White blood cells are made by your body to help fight infection. TREATMENT  Typically, UTIs can be treated with medication. Because most UTIs are caused by a bacterial infection, they usually can be treated with the use of antibiotics. The choice of antibiotic and length of treatment depend on your symptoms and the type of bacteria causing your infection. HOME CARE INSTRUCTIONS  If you were prescribed  antibiotics, take them exactly as your caregiver instructs you. Finish the medication even if you feel better after you have only taken some of the medication.  Drink enough water and fluids to keep your urine clear or pale yellow.  Avoid caffeine, tea, and carbonated beverages. They tend to irritate your bladder.  Empty your bladder often. Avoid holding urine for long periods of time.  Empty your bladder before and after sexual intercourse.  After a bowel movement, women should cleanse from front to back. Use each tissue only once. SEEK MEDICAL CARE IF:   You have back pain.  You develop a fever.  Your symptoms do not begin to resolve within 3 days. SEEK IMMEDIATE MEDICAL CARE IF:   You have severe back pain or lower abdominal pain.  You develop chills.  You have nausea or vomiting.  You have continued burning or discomfort with urination. MAKE SURE YOU:   Understand these instructions.  Will watch your condition.  Will get help right away if you are not doing well or get worse. Document Released: 05/06/2005 Document Revised: 01/26/2012 Document Reviewed: 09/04/2011 [facility] Patient Information 2014 [place].  Diarrhea Diarrhea is frequent loose and watery bowel movements. It can cause you to feel weak and dehydrated. Dehydration can cause you to become tired and thirsty, have a dry mouth, and have decreased urination that often is dark yellow. Diarrhea is a sign of another problem, most often an infection that will not last long. In most cases, diarrhea typically lasts 2 3 days. However, it can last longer if it is a  sign of something more serious. It is important to treat your diarrhea as directed by your caregive to lessen or prevent future episodes of diarrhea. CAUSES  Some common causes include:  Gastrointestinal infections caused by viruses, bacteria, or parasites.  Food poisoning or food allergies.  Certain medicines, such as antibiotics, chemotherapy, and  laxatives.  Artificial sweeteners and fructose.  Digestive disorders. HOME CARE INSTRUCTIONS  Ensure adequate fluid intake (hydration): have 1 cup (8 oz) of fluid for each diarrhea episode. Avoid fluids that contain simple sugars or sports drinks, fruit juices, whole milk products, and sodas. Your urine should be clear or pale yellow if you are drinking enough fluids. Hydrate with an oral rehydration solution that you can purchase at pharmacies, retail stores, and online. You can prepare an oral rehydration solution at home by mixing the following ingredients together:    tsp table salt.   tsp baking soda.   tsp salt substitute containing potassium chloride.  1  tablespoons sugar.  1 L (34 oz) of water.  Certain foods and beverages may increase the speed at which food moves through the gastrointestinal (GI) tract. These foods and beverages should be avoided and include:  Caffeinated and alcoholic beverages.  High-fiber foods, such as raw fruits and vegetables, nuts, seeds, and whole grain breads and cereals.  Foods and beverages sweetened with sugar alcohols, such as xylitol, sorbitol, and mannitol.  Some foods may be well tolerated and may help thicken stool including:  Starchy foods, such as rice, toast, pasta, low-sugar cereal, oatmeal, grits, baked potatoes, crackers, and bagels.  Bananas.  Applesauce.  Add probiotic-rich foods to help increase healthy bacteria in the GI tract, such as yogurt and fermented milk products.  Wash your hands well after each diarrhea episode.  Only take over-the-counter or prescription medicines as directed by your caregiver.  Take a warm bath to relieve any burning or pain from frequent diarrhea episodes. SEEK IMMEDIATE MEDICAL CARE IF:   You are unable to keep fluids down.  You have persistent vomiting.  You have blood in your stool, or your stools are black and tarry.  You do not urinate in 6 8 hours, or there is only a small  amount of very dark urine.  You have abdominal pain that increases or localizes.  You have weakness, dizziness, confusion, or lightheadedness.  You have a severe headache.  Your diarrhea gets worse or does not get better.  You have a fever or persistent symptoms for more than 2 3 days.  You have a fever and your symptoms suddenly get worse. MAKE SURE YOU:   Understand these instructions.  Will watch your condition.  Will get help right away if you are not doing well or get worse. Document Released: 07/17/2002 Document Revised: 07/13/2012 Document Reviewed: 04/03/2012 [facility] Patient Information 2014 [place], [place].

## 2013-08-21 NOTE — ED Notes (Signed)
Patient requesting something to eat - states she is hungry and has only had peanut butter and crackers we gave her since early this am.

## 2013-08-22 ENCOUNTER — Ambulatory Visit (INDEPENDENT_AMBULATORY_CARE_PROVIDER_SITE_OTHER): Payer: Medicare Other | Admitting: *Deleted

## 2013-08-22 DIAGNOSIS — Z950 Presence of cardiac pacemaker: Secondary | ICD-10-CM | POA: Diagnosis not present

## 2013-08-22 DIAGNOSIS — I4891 Unspecified atrial fibrillation: Secondary | ICD-10-CM

## 2013-08-22 DIAGNOSIS — I495 Sick sinus syndrome: Secondary | ICD-10-CM | POA: Diagnosis not present

## 2013-08-23 ENCOUNTER — Encounter: Payer: Self-pay | Admitting: Internal Medicine

## 2013-08-23 ENCOUNTER — Ambulatory Visit: Payer: Self-pay | Admitting: Nurse Practitioner

## 2013-08-23 LAB — MDC_IDC_ENUM_SESS_TYPE_REMOTE
Battery Voltage: 2.98 V
Brady Statistic RV Percent Paced: 13 %
Lead Channel Impedance Value: 840 Ohm
Lead Channel Pacing Threshold Pulse Width: 0.5 ms
Lead Channel Sensing Intrinsic Amplitude: 12 mV
Lead Channel Setting Pacing Pulse Width: 0.5 ms
Lead Channel Setting Sensing Sensitivity: 2 mV
MDC IDC MSMT BATTERY REMAINING LONGEVITY: 136 mo
MDC IDC MSMT LEADCHNL RV PACING THRESHOLD AMPLITUDE: 0.5 V
MDC IDC PG SERIAL: 7199163
MDC IDC SESS DTM: 20150114173504
MDC IDC SET LEADCHNL RV PACING AMPLITUDE: 2.5 V

## 2013-08-25 ENCOUNTER — Other Ambulatory Visit: Payer: Self-pay | Admitting: *Deleted

## 2013-08-25 ENCOUNTER — Telehealth (HOSPITAL_COMMUNITY): Payer: Self-pay

## 2013-08-25 LAB — URINE CULTURE: Colony Count: >=100000

## 2013-08-25 MED ORDER — FUROSEMIDE 40 MG PO TABS: 40.0000 mg | ORAL_TABLET | Freq: Every day | ORAL | Status: DC

## 2013-08-25 NOTE — ED Notes (Signed)
Post ED Visit - Positive Culture Follow-up: Successful Patient Follow-Up  Culture assessed and recommendations reviewed by: [x]  Wes Silverton, Pharm.D., BCPS []  Heide Guile, Pharm.D., BCPS []  Alycia Rossetti, Pharm.D., BCPS []  [place], Pharm.D., BCPS, AAHIVP []  Legrand Como, Pharm.D., BCPS, AAHIVP  Positive Urine culture -> >/= 100,000 colonies of Enterobacter Cloacae   []  Patient discharged without antimicrobial prescription and treatment is now indicated [x]  Organism is resistant to prescribed ED discharge antimicrobial []  Patient with positive blood cultures  Changes discussed with ED provider: Clayton Bibles PA New antibiotic prescription Ciprofloxacin 500 mg po once daily x 3 days (poor renal function) Called to [facility] in [place], Rx given to pharmacist.  Contacted patient, date 08/25/13, time 12:20 Pt informed of Dx and need to change tx and to follow up for INR check soon after.  Rx called to [facility] 08/25/2013, 12:26 PM

## 2013-08-25 NOTE — Progress Notes (Signed)
ED Antimicrobial Stewardship Positive Culture Follow Up   [name] is an 79 y.o. female who presented to [facility] on 08/21/2013 with a chief complaint of  Chief Complaint  Patient presents with  . Abdominal Pain    Recent Results (from the past 720 hour(s))  URINE CULTURE     Status: None   Collection Time    08/21/13  5:33 PM      Result Value Range Status   Specimen Description URINE, CLEAN CATCH   Final   Special Requests NONE   Final   Culture  Setup Time     Final   Value: 08/21/2013 21:30     Performed at [facility]     Final   Value: >=100,000 COLONIES/ML     Performed at [facility]     Final   Value: ENTEROBACTER CLOACAE     Performed at [facility]   Report Status 08/25/2013 FINAL   Final   Organism ID, Bacteria ENTEROBACTER CLOACAE   Final  CLOSTRIDIUM DIFFICILE BY PCR     Status: None   Collection Time    08/21/13  6:00 PM      Result Value Range Status   C difficile by pcr NEGATIVE  NEGATIVE Final    [x]  Treated with Cephalexin, organism resistant to prescribed antimicrobial []  Patient discharged originally without antimicrobial agent and treatment is now indicated  Recommendations: 1. Stop Cephalexin 2. Start Ciprofloxacin 500mg  PO once daily x 3 days (adjusted for CrCl <30 ml/min) 3. Patient is on Coumadin - recommend to have INR checked within 3-5 days as Ciprofloxacin can increase thei INR  ED Provider: Clayton Bibles, PA-C   Earleen Newport 08/25/2013, 2:52 PM Infectious Diseases Pharmacist Phone# [phone]

## 2013-08-27 ENCOUNTER — Other Ambulatory Visit: Payer: Self-pay | Admitting: Nurse Practitioner

## 2013-08-28 ENCOUNTER — Ambulatory Visit (INDEPENDENT_AMBULATORY_CARE_PROVIDER_SITE_OTHER): Payer: Medicare Other | Admitting: Cardiovascular Disease

## 2013-08-28 ENCOUNTER — Encounter: Payer: Self-pay | Admitting: Cardiovascular Disease

## 2013-08-28 VITALS — BP 106/67 | HR 68 | Ht 63.0 in | Wt 146.0 lb

## 2013-08-28 DIAGNOSIS — I495 Sick sinus syndrome: Secondary | ICD-10-CM | POA: Diagnosis not present

## 2013-08-28 DIAGNOSIS — I4891 Unspecified atrial fibrillation: Secondary | ICD-10-CM | POA: Diagnosis not present

## 2013-08-28 DIAGNOSIS — Z79899 Other long term (current) drug therapy: Secondary | ICD-10-CM

## 2013-08-28 DIAGNOSIS — I5032 Chronic diastolic (congestive) heart failure: Secondary | ICD-10-CM

## 2013-08-28 DIAGNOSIS — I10 Essential (primary) hypertension: Secondary | ICD-10-CM

## 2013-08-28 DIAGNOSIS — I739 Peripheral vascular disease, unspecified: Secondary | ICD-10-CM

## 2013-08-28 DIAGNOSIS — Z950 Presence of cardiac pacemaker: Secondary | ICD-10-CM

## 2013-08-28 DIAGNOSIS — I779 Disorder of arteries and arterioles, unspecified: Secondary | ICD-10-CM

## 2013-08-28 DIAGNOSIS — I359 Nonrheumatic aortic valve disorder, unspecified: Secondary | ICD-10-CM

## 2013-08-28 DIAGNOSIS — I350 Nonrheumatic aortic (valve) stenosis: Secondary | ICD-10-CM

## 2013-08-28 NOTE — Patient Instructions (Signed)
Continue all current medications. Your physician wants you to follow up in: 6 months.  You will receive a reminder letter in the mail one-two months in advance.  If you don't receive a letter, please call our office to schedule the follow up appointment   

## 2013-08-28 NOTE — Progress Notes (Signed)
Patient ID: [name], female   DOB: [birth year], 79 y.o.   MRN: [id number]      SUBJECTIVE: The patient is here for routine cardiovascular followup. She was hospitalized for a COPD exacerbation and mild diastolic heart failure in November 2014. There was some question of new T-wave abnormalities at that time but she denied chest pain. She has permanent atrial fibrillation/flutter and is rate controlled and has a pacemaker. Device interrogation on 06/28/13 revealed that her heart rates have been well controlled. She has occasional tachycardia and takes an extra Metoprolol prn. These episodes are very infrequent.   She also has a h/o mild nonobstructive CAD and a previous tachycardia-induced cardiomyopathy which has since normalized in function. Her most recent echocardiogram in November 2014 showed normal left ventricular systolic function and very mild aortic stenosis, with no evidence of mitral stenosis and moderate biatrial enlargement. The patient denies any symptoms of chest pain, palpitations, shortness of breath, lightheadedness, dizziness, leg swelling, orthopnea, PND, and syncope.  She continues to smoke 0.5 ppd but realizes she should quit.  She is currently being treated for a UTI. She is also going to be seeing her gastroenterologist for gastritis.       Allergies  Allergen Reactions  . Torsemide Nausea And Vomiting  . Tramadol Nausea Only    Current Outpatient Prescriptions  Medication Sig Dispense Refill  . albuterol (PROVENTIL HFA;VENTOLIN HFA) 108 (90 BASE) MCG/ACT inhaler Inhale 2 puffs into the lungs every 6 (six) hours as needed for wheezing.      . bisacodyl (DULCOLAX) 5 MG EC tablet Take 5 mg by mouth daily as needed for constipation.      . budesonide-formoterol (SYMBICORT) 160-4.5 MCG/ACT inhaler Inhale 2 puffs into the lungs 2 (two) times daily.      . ciprofloxacin (CIPRO) 500 MG tablet Take 500 mg by mouth daily.      . clonazePAM (KLONOPIN) 0.5 MG tablet  Take 0.5 mg by mouth 3 (three) times daily as needed for anxiety.      [name] diltiazem (CARDIZEM SR) 120 MG 12 hr capsule Take 120 mg by mouth every morning.      . furosemide (LASIX) 40 MG tablet Take 1 tablet (40 mg total) by mouth daily.  30 tablet  1  . glyBURIDE (DIABETA) 5 MG tablet Take 2.5 mg by mouth daily with breakfast.       . HYDROcodone-acetaminophen (NORCO/VICODIN) 5-325 MG per tablet Take 1 tablet by mouth 3 (three) times daily as needed for moderate pain.      . metoprolol tartrate (LOPRESSOR) 25 MG tablet Take 1 tablet (25 mg total) by mouth 2 (two) times daily.  60 tablet  11  . omeprazole (PRILOSEC) 40 MG capsule Take 40 mg by mouth 2 (two) times daily.      . simvastatin (ZOCOR) 10 MG tablet Take 10 mg by mouth at bedtime.      . sucralfate (CARAFATE) 1 GM/10ML suspension Take 10 mLs (1 g total) by mouth 4 (four) times daily -  with meals and at bedtime.  420 mL  0  . tiotropium (SPIRIVA) 18 MCG inhalation capsule Place 18 mcg into inhaler and inhale at bedtime.      [name] warfarin (COUMADIN) 2 MG tablet Take 1-2 mg by mouth See admin instructions. Takes one tablet (2 mg total) on Wednesdays, then take one-half tablet (1mg  total) on all other days      . zafirlukast (ACCOLATE) 20 MG tablet Take 20 mg by mouth  2 (two) times daily.       No current facility-administered medications for this visit.    Past Medical History  Diagnosis Date  . Permanent atrial fibrillation     H/o tachybradycardia syndrome on Coumadin anticoagulation, has pacemaker.  [name] CAD (coronary artery disease) 2011    Catheterization August 2011: mild nonobstructive coronary artery disease complicated by large left rectus sheath hematoma status post evacuation Coumadin restarted without recurrence  . Diabetes mellitus   . Chronic airway obstruction, not elsewhere classified   . Anemia, unspecified   . Chronic kidney disease, stage II (mild)   . Unspecified hypertensive kidney disease with chronic kidney disease  stage I through stage IV, or unspecified   . Tachycardia-bradycardia     s/p St[employer] Jude single chamber PPM 10/2010   . GERD (gastroesophageal reflux disease)   . Abnormal CT of the chest     Mediastinal and hilar adenopathy followed by Dr. Koleen Nimrod in [place]  . Congestive heart failure, unspecified     EF now 60%, previous nonischemic cardiomyopathy  . Pacemaker     Single chamber [employer]. Jude ACCENT [phone] SN 719 04/11/1959 10/31/2010  . Valvular heart disease     a. H/o moderate mitral stenosis by cath 2011 but no mention of this on 10/2011 echo. b. 10/2011 echo: mod MR, mild AS, mild TR; EF>65%  . Hypercholesterolemia   . Diverticulitis Dec 2012  . Body mass index (BMI) of 20.0-20.9 in adult FEB 2013 147 LBS  . Recurrent UTI   . Neurogenic sleep apnea     Uses noctural oxygen prescribed by her pulmonologi  . Rheumatic heart disease   . Gastritis     By EGD  . Carotid artery disease     Doppler, 05/2012, 0-39% bilateral  . Vitamin D deficiency   . Dilated bile duct 10/19/2011    Past Surgical History  Procedure Laterality Date  . Single-chamber pacemaker implantation  3/12    by Dr Caryl Comes  . Evacuation of retroperitoneal and left rectus sheath    . Cholecystectomy      40+ years ago  . Hernia repair      X3  . Pilonidal cyst / sinus excision    . Esophagogastroduodenoscopy  10/2011    Fields-erosive gastritis, biopsy with no H. pylori, hiatal hernia, peptic duodenitis, no celiac disease, duodenal diverticulum, duodenal nodule  . Colonoscopy  10/2011    Dyann Ruddle sigmoid to descending diverticulosis, internal hemorrhoids  . Eus  11/16/11    Mishra-13 MM CBD, normal pancreatic duct, otherwise normal EUS    History   Social History  . Marital Status: Single    Spouse Name: N/A    Number of Children: 2  . Years of Education: N/A   Occupational History  . RETIRED    Social History Main Topics  . Smoking status: Current Every Day Smoker -- 0.50 packs/day for 45 years     Types: Cigarettes    Last Attempt to Quit: 08/10/2006  . Smokeless tobacco: Never Used     Comment: smokes 2-3 cigarettes daily  . Alcohol Use: No  . Drug Use: No  . Sexual Activity: Not on file   Other Topics Concern  . Not on file   Social History Narrative  . No narrative on file     Filed Vitals:   08/28/13 1118  BP: 106/67  Pulse: 68  Height: 5\' 3"  (1.6 m)  Weight: 146 lb (66.225 kg)  SpO2: 100%  PHYSICAL EXAM General: NAD Neck: No JVD, no thyromegaly or thyroid nodule.  Lungs: Occasional faint expiratory wheezes, but otherwise clear to auscultation bilaterally with normal respiratory effort. CV: Nondisplaced PMI.  Heart regular S1/S2, no S3/S4, II/VI ejection systolic murmur at RUSB.  No peripheral edema.  No carotid bruit.  Normal pedal pulses.  Abdomen: Soft, nontender, no hepatosplenomegaly, no distention.  Neurologic: Alert and oriented x 3.  Psych: Normal affect. Extremities: No clubbing or cyanosis.   ECG: reviewed and available in electronic records.  Echo (06-28-13): - Left ventricle: The cavity size was normal. Wall thickness was increased in a pattern of mild LVH. Systolic function was normal. The estimated ejection fraction was in the range of 60% to 65%. Wall motion was normal; there were no regional wall motion abnormalities. - Aortic valve: There was very mild stenosis. Valve area: 1.38cm^2 (Vmax). - Mitral valve: Valve area by pressure half-time: 1.88cm^2 (no stenosis). - Left atrium: The atrium was moderately dilated. - Right atrium: The atrium was moderately dilated.     ASSESSMENT AND PLAN: 1. Permanent atrial fibrillation s/p pacemaker: stable. Continue Warfarin, Metoprolol, and Diltiazem. No bleeding complications noted.  2. Valvular heart disease: stable from a symptomatic standpoint with very mild aortic stenosis by echo in 06/2013. Continue monitoring.  3. Carotid artery stenosis: mild bilaterally in October 2013. 4. Chronic  diastolic heart failure: compensated and symptomatically stable. 5. Tobacco abuse: cessation counseling provided today.  Dispo: f/u 6 months.    Kate Sable, M.D., F.A.C.C.

## 2013-08-29 ENCOUNTER — Telehealth: Payer: Self-pay | Admitting: Nurse Practitioner

## 2013-08-29 MED ORDER — HYDROCODONE-ACETAMINOPHEN 5-325 MG PO TABS: 1.0000 | ORAL_TABLET | Freq: Three times a day (TID) | ORAL | Status: DC | PRN

## 2013-08-29 NOTE — Telephone Encounter (Signed)
rx ready for pickup 

## 2013-08-29 NOTE — Telephone Encounter (Signed)
Patient aware to pick up 

## 2013-08-31 ENCOUNTER — Ambulatory Visit (INDEPENDENT_AMBULATORY_CARE_PROVIDER_SITE_OTHER): Payer: Medicare Other | Admitting: Pharmacist

## 2013-08-31 ENCOUNTER — Telehealth: Payer: Self-pay | Admitting: *Deleted

## 2013-08-31 DIAGNOSIS — I4891 Unspecified atrial fibrillation: Secondary | ICD-10-CM | POA: Diagnosis not present

## 2013-08-31 DIAGNOSIS — N390 Urinary tract infection, site not specified: Secondary | ICD-10-CM | POA: Diagnosis not present

## 2013-08-31 LAB — POCT URINALYSIS DIPSTICK
BILIRUBIN UA: NEGATIVE
GLUCOSE UA: NEGATIVE
Ketones, UA: NEGATIVE
NITRITE UA: NEGATIVE
Protein, UA: NEGATIVE
Spec Grav, UA: 1.01
UROBILINOGEN UA: NEGATIVE
pH, UA: 7.5

## 2013-08-31 LAB — POCT INR: INR: 3.1

## 2013-08-31 NOTE — Telephone Encounter (Signed)
They will only allow omeprazole QD

## 2013-08-31 NOTE — Patient Instructions (Signed)
Anticoagulation Dose Instructions as of 08/31/2013     Dorene Grebe Tue Wed Thu Fri Sat   New Dose 1 mg 1 mg 1 mg 2 mg 1 mg 1 mg 1 mg    Description       Hold warfarin for 1 day then, continue warfarin 1 tablet on wednesdays and 1/2 tablet all others.       INR was 3.1 today

## 2013-08-31 NOTE — Telephone Encounter (Signed)
Ins has denied omeprazole dr 40 mg twice daily.

## 2013-08-31 NOTE — Telephone Encounter (Signed)
Patient is going to call GI and see if they can get it approved for bid

## 2013-09-01 ENCOUNTER — Telehealth: Payer: Self-pay | Admitting: *Deleted

## 2013-09-01 ENCOUNTER — Telehealth: Payer: Self-pay | Admitting: Gastroenterology

## 2013-09-01 NOTE — Telephone Encounter (Signed)
Routing to [employer].

## 2013-09-01 NOTE — Telephone Encounter (Signed)
I called pt. She said her insurance has changed to [facility] as of first of the year. She received a letter that they will only pay for Omeprazole qd ( she has been taking bid). Please advise!

## 2013-09-01 NOTE — Telephone Encounter (Signed)
Pt has several questions about her medicines and how and when to take them. Also, questions about her new insurance that will cover only certain medicines she's on. Please advise and call patient at [phone] (She has an OV coming up in February)

## 2013-09-01 NOTE — Telephone Encounter (Signed)
Wait on culture- last culture 1 week prior was negative

## 2013-09-01 NOTE — Telephone Encounter (Signed)
Let's see if we can fill out prior auth for BID.

## 2013-09-01 NOTE — Telephone Encounter (Signed)
Pharmacist Tammy called and said she saw patient yesterday for PT INR and did a urine on patient. Dip results are in and a culture was sent off. Tammy wants to know if a rx needs to be called in now or if we should wait on culture. Please advise. If rx is needed please send to pharmacy

## 2013-09-02 LAB — URINE CULTURE

## 2013-09-04 ENCOUNTER — Encounter: Payer: Self-pay | Admitting: *Deleted

## 2013-09-04 ENCOUNTER — Telehealth: Payer: Self-pay | Admitting: Nurse Practitioner

## 2013-09-04 NOTE — Telephone Encounter (Signed)
Working on [place]. pts [employer] ID number is [id number]

## 2013-09-04 NOTE — Telephone Encounter (Signed)
Please check on this.

## 2013-09-05 NOTE — Telephone Encounter (Signed)
Received message from [facility] that pt did not need PA for this medication. Called [employer] at [phone] and spoke with rep, she said pt did not need PA even for bid dosing and if pharmacy has problems, they need to call [employer]. Called and left message at [facility] in [place] that [place] said pt did not need PA for this medication and if they have problems filling it, they need to call [employer] to straighten it out.

## 2013-09-06 ENCOUNTER — Telehealth: Payer: Self-pay | Admitting: Nurse Practitioner

## 2013-09-06 NOTE — Telephone Encounter (Signed)
Urine culture negative.

## 2013-09-06 NOTE — Telephone Encounter (Signed)
Pt wants urine/culture results from last week. Can you review please? Thanks.

## 2013-09-07 NOTE — Telephone Encounter (Signed)
Pt.notified

## 2013-09-11 NOTE — Telephone Encounter (Signed)
[name] please check on medicare nit paying for glyburide

## 2013-09-12 ENCOUNTER — Telehealth: Payer: Self-pay | Admitting: Nurse Practitioner

## 2013-09-13 ENCOUNTER — Ambulatory Visit: Payer: Self-pay | Admitting: Nurse Practitioner

## 2013-09-13 ENCOUNTER — Encounter: Payer: Self-pay | Admitting: General Practice

## 2013-09-13 ENCOUNTER — Ambulatory Visit (INDEPENDENT_AMBULATORY_CARE_PROVIDER_SITE_OTHER): Payer: Medicare Other | Admitting: General Practice

## 2013-09-13 VITALS — BP 112/65 | HR 63 | Temp 98.2°F | Ht 63.0 in | Wt 147.5 lb

## 2013-09-13 DIAGNOSIS — J209 Acute bronchitis, unspecified: Secondary | ICD-10-CM

## 2013-09-13 MED ORDER — AZITHROMYCIN 250 MG PO TABS: ORAL_TABLET | ORAL | Status: DC

## 2013-09-13 MED ORDER — METHYLPREDNISOLONE ACETATE 80 MG/ML IJ SUSP
80.0000 mg | Freq: Once | INTRAMUSCULAR | Status: AC
  Administered 2013-09-13: 80 mg via INTRAMUSCULAR

## 2013-09-13 NOTE — Progress Notes (Signed)
   Subjective:    Patient ID: [name], female    DOB: 10/09/1934, 79 y.o.   MRN: [id number]  Cough This is a new problem. The current episode started in the past 7 days. The problem has been unchanged. The cough is non-productive. Pertinent negatives include no chest pain, chills, fever or shortness of breath. The symptoms are aggravated by lying down. She has tried a beta-agonist inhaler for the symptoms. Her past medical history is significant for bronchitis and COPD.      Review of Systems  Constitutional: Negative for fever and chills.  HENT: Negative for congestion.   Respiratory: Positive for cough. Negative for chest tightness and shortness of breath.   Cardiovascular: Negative for chest pain and palpitations.       Objective:   Physical Exam  Constitutional: She is oriented to person, place, and time. She appears well-developed and well-nourished.  Pulmonary/Chest: Effort normal. No respiratory distress. She exhibits no tenderness.  Dry, hacky cough. Diminished breath sounds throughout  Neurological: She is alert and oriented to person, place, and time.  Skin: Skin is warm and dry.  Psychiatric: She has a normal mood and affect.          Assessment & Plan:  1. Acute bronchitis - azithromycin (ZITHROMAX) 250 MG tablet; Take as directed  Dispense: 6 tablet; Refill: 0 - methylPREDNISolone acetate (DEPO-MEDROL) injection 80 mg; Inject 1 mL (80 mg total) into the muscle once. -continue to use inhalers as directed -avoid irritants -RTO if symptoms worsen  -may seek emergency medical treatment Patient verbalized understanding Erby Pian, FNP-C

## 2013-09-13 NOTE — Telephone Encounter (Signed)
appt rescheduled.

## 2013-09-13 NOTE — Patient Instructions (Signed)

## 2013-09-14 ENCOUNTER — Encounter: Payer: Self-pay | Admitting: Nurse Practitioner

## 2013-09-14 ENCOUNTER — Ambulatory Visit (INDEPENDENT_AMBULATORY_CARE_PROVIDER_SITE_OTHER): Payer: Medicare Other | Admitting: Nurse Practitioner

## 2013-09-14 VITALS — BP 110/75 | HR 65 | Temp 98.6°F | Ht 63.0 in | Wt 146.0 lb

## 2013-09-14 DIAGNOSIS — E119 Type 2 diabetes mellitus without complications: Secondary | ICD-10-CM

## 2013-09-14 DIAGNOSIS — I739 Peripheral vascular disease, unspecified: Secondary | ICD-10-CM

## 2013-09-14 DIAGNOSIS — I779 Disorder of arteries and arterioles, unspecified: Secondary | ICD-10-CM | POA: Diagnosis not present

## 2013-09-14 DIAGNOSIS — F419 Anxiety disorder, unspecified: Secondary | ICD-10-CM

## 2013-09-14 DIAGNOSIS — I10 Essential (primary) hypertension: Secondary | ICD-10-CM | POA: Diagnosis not present

## 2013-09-14 DIAGNOSIS — I509 Heart failure, unspecified: Secondary | ICD-10-CM

## 2013-09-14 DIAGNOSIS — I4891 Unspecified atrial fibrillation: Secondary | ICD-10-CM

## 2013-09-14 DIAGNOSIS — F411 Generalized anxiety disorder: Secondary | ICD-10-CM

## 2013-09-14 LAB — POCT INR: INR: 3.1

## 2013-09-14 LAB — POCT GLYCOSYLATED HEMOGLOBIN (HGB A1C): HEMOGLOBIN A1C: 5.1

## 2013-09-14 MED ORDER — BUDESONIDE-FORMOTEROL FUMARATE 160-4.5 MCG/ACT IN AERO: 2.0000 | INHALATION_SPRAY | Freq: Two times a day (BID) | RESPIRATORY_TRACT | Status: DC

## 2013-09-14 NOTE — Patient Instructions (Signed)
Anticoagulation Dose Instructions as of 09/14/2013     Dorene Grebe Tue Wed Thu Fri Sat   New Dose 1 mg 1 mg 1 mg 2 mg 1 mg 1 mg 1 mg    Description       Hold today then continue current dose      Recheck in 2 weeks

## 2013-09-14 NOTE — Progress Notes (Signed)
Subjective:    Patient ID: [name], female    DOB: [birth year], 79 y.o.   MRN: [id number]  HPI Patient in today for follow up of chronic medical problems- She was seen last night in office and was diagnoses with bronchitis and was given steroid shot and z pak- SH e says she is feeling better this morning- No other complaints today. She is a diabetic and ssays that fasting blood sugar have been running around 108-125. Patient Active Problem List   Diagnosis Date Noted  . GERD (gastroesophageal reflux disease) 07/13/2013  . Diarrhea 07/13/2013  . Warfarin anticoagulation 06/27/2013  . Mitral regurgitation 06/27/2013  . Aortic stenosis 06/27/2013  . COPD with acute exacerbation 06/27/2013  . Acute on chronic diastolic CHF (congestive heart failure) 06/27/2013  . Vitamin D deficiency   . Peripheral edema 07/21/2012  . Carotid artery disease   . Tobacco abuse 05/12/2012  . Hypoglycemia 05/11/2012  . Dysuria 10/21/2011  . Abdominal wall hernia 10/19/2011  . Constipation 09/29/2011  . Abnormal CT of the abdomen 09/29/2011  . Abdominal pain 09/29/2011  . Weight loss 09/29/2011  . CAD (coronary artery disease)   . Abnormal CT of the chest   . Pacemaker-St.Jude   . Tachycardia-bradycardia syndrome 12/03/2010  . Chronic anticoagulation 12/03/2010  . Anxiety 12/03/2010  . Mitral stenosis 05/30/2010  . ANEMIA 05/16/2010  . Type II or unspecified type diabetes mellitus without mention of complication, not stated as uncontrolled 01/19/2009  . HYPERTENSION 01/19/2009  . Atrial fibrillation 01/19/2009  . CHF 01/19/2009  . COPD 01/19/2009  . HEART MURMUR, BENIGN 01/19/2009   Outpatient Encounter Prescriptions as of 09/14/2013  Medication Sig  . albuterol (PROVENTIL HFA;VENTOLIN HFA) 108 (90 BASE) MCG/ACT inhaler Inhale 2 puffs into the lungs every 6 (six) hours as needed for wheezing.  [name] azithromycin (ZITHROMAX) 250 MG tablet Take as directed  . bisacodyl (DULCOLAX) 5 MG EC tablet  Take 5 mg by mouth daily as needed for constipation.  . budesonide-formoterol (SYMBICORT) 160-4.5 MCG/ACT inhaler Inhale 2 puffs into the lungs 2 (two) times daily.  . clonazePAM (KLONOPIN) 0.5 MG tablet Take 0.5 mg by mouth 3 (three) times daily as needed for anxiety.  [name] diltiazem (CARDIZEM SR) 120 MG 12 hr capsule Take 120 mg by mouth every morning.  . furosemide (LASIX) 40 MG tablet Take 1 tablet (40 mg total) by mouth daily.  [name] glyBURIDE (DIABETA) 5 MG tablet Take 2.5 mg by mouth daily with breakfast.   . HYDROcodone-acetaminophen (NORCO/VICODIN) 5-325 MG per tablet Take 1 tablet by mouth 3 (three) times daily as needed for moderate pain.  . metoprolol tartrate (LOPRESSOR) 25 MG tablet Take 1 tablet (25 mg total) by mouth 2 (two) times daily.  [name] omeprazole (PRILOSEC) 40 MG capsule Take 40 mg by mouth 2 (two) times daily.  [name] PROAIR HFA 108 (90 BASE) MCG/ACT inhaler USE 2 PUFFS BY MOUTH EVERY 3   TO 4 HOURS WHILE AWAKE AS NEEDED  . simvastatin (ZOCOR) 10 MG tablet Take 10 mg by mouth at bedtime.  . sucralfate (CARAFATE) 1 GM/10ML suspension Take 10 mLs (1 g total) by mouth 4 (four) times daily -  with meals and at bedtime.  [name] tiotropium (SPIRIVA) 18 MCG inhalation capsule Place 18 mcg into inhaler and inhale at bedtime.  [name] warfarin (COUMADIN) 2 MG tablet Take 1-2 mg by mouth See admin instructions. Takes one tablet (2 mg total) on Wednesdays, then take one-half tablet ($RemoveBeforeDE'1mg'[id number]$  total) on all other days  .  zafirlukast (ACCOLATE) 20 MG tablet Take 20 mg by mouth 2 (two) times daily.        Review of Systems  Constitutional: Negative for fever and chills.  HENT: Positive for congestion.   Respiratory: Positive for cough. Negative for shortness of breath.   Cardiovascular: Negative for chest pain, palpitations and leg swelling.  Gastrointestinal: Negative.   Genitourinary: Negative.   Neurological: Negative.   Psychiatric/Behavioral: Negative.   All other systems reviewed and are  negative.       Objective:   Physical Exam  Constitutional: She is oriented to person, place, and time. She appears well-developed and well-nourished.  HENT:  Nose: Nose normal.  Mouth/Throat: Oropharynx is clear and moist.  Eyes: EOM are normal.  Neck: Trachea normal, normal range of motion and full passive range of motion without pain. Neck supple. No JVD present. Carotid bruit is not present. No thyromegaly present.  Cardiovascular: Normal rate, regular rhythm and intact distal pulses.  Exam reveals no gallop and no friction rub.   Murmur (2/6) heard. Pulmonary/Chest: Effort normal and breath sounds normal.  Abdominal: Soft. Bowel sounds are normal. She exhibits no distension and no mass. There is no tenderness.  Musculoskeletal: Normal range of motion.  Lymphadenopathy:    She has no cervical adenopathy.  Neurological: She is alert and oriented to person, place, and time. She has normal reflexes.  Skin: Skin is warm and dry.  Psychiatric: She has a normal mood and affect. Her behavior is normal. Judgment and thought content normal.   BP 110/75  Pulse 65  Temp(Src) 98.6 F (37 C) (Oral)  Ht _0  (1.6 m)  Wt 146 lb (66.225 kg)  BMI 25.87 kg/m2 Results for orders placed in visit on 09/14/13  POCT GLYCOSYLATED HEMOGLOBIN (HGB A1C)      Result Value Range   Hemoglobin A1C 5.1    POCT INR      Result Value Range   INR 3.1            Assessment & Plan:   1. Type II or unspecified type diabetes mellitus without mention of complication, not stated as uncontrolled   2. HYPERTENSION   3. Carotid artery disease   4. A-fib   5. CHF   6. Atrial fibrillation   7. Anxiety    Orders Placed This Encounter  Procedures  . CMP14+EGFR  . NMR, lipoprofile  . POCT glycosylated hemoglobin (Hb A1C)     Labs pending Health maintenance reviewed Diet and exercise encouraged Continue all meds Follow up  In 2 to recheck INR   [name], FNP

## 2013-09-15 LAB — CMP14+EGFR
ALK PHOS: 86 IU/L (ref 39–117)
ALT: 10 IU/L (ref 0–32)
AST: 22 IU/L (ref 0–40)
Albumin/Globulin Ratio: 1.5 (ref 1.1–2.5)
Albumin: 3.9 g/dL (ref 3.5–4.8)
BUN / CREAT RATIO: 19 (ref 11–26)
BUN: 27 mg/dL (ref 8–27)
CALCIUM: 9.8 mg/dL (ref 8.7–10.3)
CHLORIDE: 100 mmol/L (ref 97–108)
CO2: 25 mmol/L (ref 18–29)
Creatinine, Ser: 1.39 mg/dL — ABNORMAL HIGH (ref 0.57–1.00)
GFR calc Af Amer: 42 mL/min/{1.73_m2} — ABNORMAL LOW (ref >59)
GFR calc non Af Amer: 37 mL/min/{1.73_m2} — ABNORMAL LOW (ref >59)
Globulin, Total: 2.6 g/dL (ref 1.5–4.5)
Glucose: 90 mg/dL (ref 65–99)
POTASSIUM: 4.7 mmol/L (ref 3.5–5.2)
SODIUM: 142 mmol/L (ref 134–144)
Total Bilirubin: 0.5 mg/dL (ref 0.0–1.2)
Total Protein: 6.5 g/dL (ref 6.0–8.5)

## 2013-09-15 LAB — NMR, LIPOPROFILE
Cholesterol: 133 mg/dL (ref <200)
HDL CHOLESTEROL BY NMR: 27 mg/dL — AB (ref >=40)
HDL Particle Number: 17.7 umol/L — ABNORMAL LOW (ref >=30.5)
LDL PARTICLE NUMBER: 1302 nmol/L — AB (ref <1000)
LDL Size: 20.2 nm — ABNORMAL LOW (ref >20.5)
LDLC SERPL CALC-MCNC: 83 mg/dL (ref <100)
LP-IR Score: 65 — ABNORMAL HIGH (ref <=45)
Small LDL Particle Number: 819 nmol/L — ABNORMAL HIGH (ref <=527)
Triglycerides by NMR: 115 mg/dL (ref <150)

## 2013-09-20 ENCOUNTER — Telehealth: Payer: Self-pay | Admitting: Gastroenterology

## 2013-09-20 ENCOUNTER — Other Ambulatory Visit: Payer: Self-pay

## 2013-09-20 DIAGNOSIS — Z7901 Long term (current) use of anticoagulants: Secondary | ICD-10-CM | POA: Diagnosis not present

## 2013-09-20 DIAGNOSIS — F17200 Nicotine dependence, unspecified, uncomplicated: Secondary | ICD-10-CM | POA: Diagnosis not present

## 2013-09-20 DIAGNOSIS — R197 Diarrhea, unspecified: Secondary | ICD-10-CM

## 2013-09-20 DIAGNOSIS — K2990 Gastroduodenitis, unspecified, without bleeding: Secondary | ICD-10-CM | POA: Diagnosis not present

## 2013-09-20 DIAGNOSIS — K219 Gastro-esophageal reflux disease without esophagitis: Secondary | ICD-10-CM | POA: Diagnosis not present

## 2013-09-20 DIAGNOSIS — J449 Chronic obstructive pulmonary disease, unspecified: Secondary | ICD-10-CM | POA: Diagnosis not present

## 2013-09-20 DIAGNOSIS — E119 Type 2 diabetes mellitus without complications: Secondary | ICD-10-CM | POA: Diagnosis not present

## 2013-09-20 DIAGNOSIS — R109 Unspecified abdominal pain: Secondary | ICD-10-CM | POA: Diagnosis not present

## 2013-09-20 DIAGNOSIS — K2970 Gastritis, unspecified, without bleeding: Secondary | ICD-10-CM | POA: Diagnosis not present

## 2013-09-20 DIAGNOSIS — Z79899 Other long term (current) drug therapy: Secondary | ICD-10-CM | POA: Diagnosis not present

## 2013-09-20 NOTE — Telephone Encounter (Signed)
Pt called asking to speak with LSL or AS. I told her neither one was available and I could take a message. She said that her blood pressure was 152/125. I told her that was extremely too high and she needed to call her PCP. Then she said that her stomach hurts and she has gas. I told her that if her BP was that high that she needed to contact her PCP or go to the ED, but I would let the extenders and SF know this as well. [place]:[id number]

## 2013-09-20 NOTE — Telephone Encounter (Signed)
Pt is aware and will go to PCP, [facility] to pick up container for the C-Diff PCR. She will also see PCP to follow up on her blood pressure. Lab order being faxed to [facility]

## 2013-09-20 NOTE — Telephone Encounter (Signed)
Due to her h/o C.Diff and another course of antibiotics last week, we need to have her collect another stool for C.Diff PCR.  She should f/u with her PCP regarding any BP concerns.

## 2013-09-20 NOTE — Telephone Encounter (Signed)
I called pt and she was mostly upset about her BP readings today. It was 152/125 and then she took her toprol and it came down to 141/85. She has had intermittent diarrhea x 2-3 weeks. No blood in stool.. Her stomach has a lot of rolling and she has a lot of gas. Please advise!

## 2013-09-21 ENCOUNTER — Telehealth: Payer: Self-pay

## 2013-09-21 NOTE — Telephone Encounter (Signed)
Called and informed pt. She said she does have the Prilosec and Carafate. She has not been taking the Carafate and she has about 3/4 bottle and it is still good.   She said she is not having diarrhea now, had one loose stool today and that is all.   She does have appt scheduled for 09/25/2013 and she will see [place] then.

## 2013-09-21 NOTE — Telephone Encounter (Signed)
Pt called and said she went to [facility] ED last night for her abdominal pain. Said she was told she has gastritis but they did not give her anything for it. She wants to know if Magda Paganini will tell her what to do. ( She also wants to know if Magda Paganini wants her to still do the C-Diff test).   [name] is getting records from [facility] on the visit last night.

## 2013-09-21 NOTE — Telephone Encounter (Signed)
If she is on prilosec and carafate as per our med list, that is for gastritis.  She still needs to get C.Diff done if she is having diarrhea.   Reviewed records from ER visit yesterday. Glucose 104, creatinine 1.36, total bilirubin 0.7, alkaline phosphatase 81, AST 29, ALT 21, albumin 4.1, lipase 62, INR 2.0, white blood cell count 9200, hemoglobin 12.4, hematocrit 36.6, MCV 99.3, platelets 182,000 urinalysis unremarkable. Acute abdominal series showed stable cardiomegaly and single lead pacer device. Nonobstructive bowel gas pattern.  OV as scheduled.

## 2013-09-22 DIAGNOSIS — R109 Unspecified abdominal pain: Secondary | ICD-10-CM | POA: Diagnosis not present

## 2013-09-22 DIAGNOSIS — R079 Chest pain, unspecified: Secondary | ICD-10-CM | POA: Diagnosis not present

## 2013-09-25 ENCOUNTER — Ambulatory Visit (INDEPENDENT_AMBULATORY_CARE_PROVIDER_SITE_OTHER): Payer: Medicare Other | Admitting: Gastroenterology

## 2013-09-25 ENCOUNTER — Encounter: Payer: Self-pay | Admitting: Gastroenterology

## 2013-09-25 VITALS — BP 100/65 | HR 68 | Temp 97.9°F | Ht 63.0 in | Wt 143.8 lb

## 2013-09-25 DIAGNOSIS — R141 Gas pain: Secondary | ICD-10-CM

## 2013-09-25 DIAGNOSIS — R109 Unspecified abdominal pain: Secondary | ICD-10-CM

## 2013-09-25 DIAGNOSIS — R140 Abdominal distension (gaseous): Secondary | ICD-10-CM | POA: Insufficient documentation

## 2013-09-25 DIAGNOSIS — R634 Abnormal weight loss: Secondary | ICD-10-CM

## 2013-09-25 DIAGNOSIS — R142 Eructation: Secondary | ICD-10-CM

## 2013-09-25 DIAGNOSIS — R143 Flatulence: Secondary | ICD-10-CM

## 2013-09-25 MED ORDER — ALIGN 4 MG PO CAPS
4.0000 mg | ORAL_CAPSULE | Freq: Every day | ORAL | Status: DC
Start: 2013-09-25 — End: 2013-11-30

## 2013-09-25 NOTE — Patient Instructions (Signed)
1. Start Align one daily for four weeks. 2. I will let you know what Dr. Nona Dell want to do next.

## 2013-09-25 NOTE — Progress Notes (Signed)
Primary Care Physician: Redge Gainer, MD  Primary Gastroenterologist: Barney Drain, MD   Chief Complaint  Patient presents with  . Follow-up  . Abdominal Pain    HPI: [name] is a 79 y.o. female here for f/u. Last seen in 07/2013. H/O C.Diff at that time.   Called last week with some diarrhea but this resolved. Went to ER at [facility] for abdominal pain and was told she had gastritis. Abd film ok. Labs unremarkable. U/A unremarkable. Weight 143 today. Weighed 152 pounds in 07/2013. In 2103, weighed in the 130s.   Pain in abdomen every day. More notable over past 4 weeks. Has trouble passing gas. C/o indigestion. Carafate not helping pain in belly. Has been back on just for a few days. No heartburn. Lot of gurgling in the stomach. Last BM was yesterday. Took prune juice. Felt like good stool. No relief in abdomen. No dysuria. No melena, brbpr. Previously on [employer] for about one month. None in past few weeks.   Vitals - 1 value per visit 09/25/2013 09/14/2013 09/13/2013 08/28/2013 08/21/2013  Weight (lb) 143.8 146 147.5 146 146   Vitals - 1 value per visit 07/13/2013  Weight (lb) 152.8   Current Outpatient Prescriptions  Medication Sig Dispense Refill  . albuterol (PROVENTIL HFA;VENTOLIN HFA) 108 (90 BASE) MCG/ACT inhaler Inhale 2 puffs into the lungs every 6 (six) hours as needed for wheezing.      . bisacodyl (DULCOLAX) 5 MG EC tablet Take 5 mg by mouth daily as needed for constipation.      . budesonide-formoterol (SYMBICORT) 160-4.5 MCG/ACT inhaler Inhale 2 puffs into the lungs 2 (two) times daily.  1 Inhaler  5  . clonazePAM (KLONOPIN) 0.5 MG tablet Take 0.5 mg by mouth 3 (three) times daily as needed for anxiety.      [name] diltiazem (CARDIZEM SR) 120 MG 12 hr capsule Take 120 mg by mouth every morning.      . furosemide (LASIX) 40 MG tablet Take 1 tablet (40 mg total) by mouth daily.  30 tablet  1  . glyBURIDE (DIABETA) 5 MG tablet Take 2.5 mg by mouth daily with  breakfast.       . HYDROcodone-acetaminophen (NORCO/VICODIN) 5-325 MG per tablet Take 1 tablet by mouth 3 (three) times daily as needed for moderate pain.  90 tablet  0  . metoprolol tartrate (LOPRESSOR) 25 MG tablet Take 1 tablet (25 mg total) by mouth 2 (two) times daily.  60 tablet  11  . omeprazole (PRILOSEC) 40 MG capsule Take 40 mg by mouth 2 (two) times daily.      [name] PROAIR HFA 108 (90 BASE) MCG/ACT inhaler USE 2 PUFFS BY MOUTH EVERY 3   TO 4 HOURS WHILE AWAKE AS NEEDED  9 each  0  . simvastatin (ZOCOR) 10 MG tablet Take 10 mg by mouth at bedtime.      . sucralfate (CARAFATE) 1 GM/10ML suspension Take 10 mLs (1 g total) by mouth 4 (four) times daily -  with meals and at bedtime.  420 mL  0  . tiotropium (SPIRIVA) 18 MCG inhalation capsule Place 18 mcg into inhaler and inhale at bedtime.      [name] warfarin (COUMADIN) 2 MG tablet Take 1-2 mg by mouth See admin instructions. Takes one tablet (2 mg total) on Wednesdays, then take one-half tablet (1mg  total) on all other days      . zafirlukast (ACCOLATE) 20 MG tablet Take 20 mg by  mouth 2 (two) times daily.       No current facility-administered medications for this visit.    Allergies as of 09/25/2013 - Review Complete 09/25/2013  Allergen Reaction Noted  . Torsemide Nausea And Vomiting 06/03/2012  . Tramadol Nausea Only 12/03/2010    ROS:  General: Negative for anorexia,  fever, chills, fatigue, weakness. See hpi. ENT: Negative for hoarseness, difficulty swallowing , nasal congestion. CV: Negative for chest pain, angina, palpitations, dyspnea on exertion, peripheral edema.  Respiratory: Negative for dyspnea at rest, dyspnea on exertion, cough, sputum, wheezing.  GI: See history of present illness. GU:  Negative for dysuria, hematuria, urinary incontinence, urinary frequency, nocturnal urination.  Endo: see hpi  Physical Examination:   BP 100/65  Pulse 68  Temp(Src) 97.9 F (36.6 C) (Oral)  Ht 5\' 3"  (1.6 m)  Wt 143 lb 12.8 oz  (65.227 kg)  BMI 25.48 kg/m2  General: Well-nourished, well-developed in no acute distress.  Eyes: No icterus. Mouth: Oropharyngeal mucosa moist and pink , no lesions erythema or exudate. Lungs: Clear to auscultation bilaterally.  Heart: Regular rate and rhythm, no murmurs rubs or gallops.  Abdomen: Bowel sounds are normal, nontender, nondistended, no hepatosplenomegaly or masses, no abdominal bruits or hernia , no rebound or guarding.   Extremities: No lower extremity edema. No clubbing or deformities. Neuro: Alert and oriented x 4   Skin: Warm and dry, no jaundice.   Psych: Alert and cooperative, normal mood and affect.

## 2013-09-25 NOTE — Assessment & Plan Note (Signed)
Vague abdominal pain complaints. C/o bloating, gas. Continues to have constipation with bouts of self-limiting diarrhea. Weight down about ten pounds since 07/2013. Abdominal exam benign today. To discuss further with Dr. Oneida Alar, ?etiology of symptoms. Extensive evaluation along the way.   Add probiotic for four weeks.  Consider pancreatic enzymes, h/o pancreatic atrophy on prior imaging study and given numerous risk factors.

## 2013-09-27 ENCOUNTER — Ambulatory Visit (INDEPENDENT_AMBULATORY_CARE_PROVIDER_SITE_OTHER): Payer: Medicare Other | Admitting: Pharmacist

## 2013-09-27 ENCOUNTER — Encounter: Payer: Self-pay | Admitting: Pharmacist

## 2013-09-27 VITALS — BP 110/70 | HR 68 | Ht 61.0 in | Wt 148.0 lb

## 2013-09-27 DIAGNOSIS — I4891 Unspecified atrial fibrillation: Secondary | ICD-10-CM

## 2013-09-27 DIAGNOSIS — Z0000 Encounter for general adult medical examination without abnormal findings: Secondary | ICD-10-CM | POA: Diagnosis not present

## 2013-09-27 DIAGNOSIS — Z13820 Encounter for screening for osteoporosis: Secondary | ICD-10-CM

## 2013-09-27 LAB — POCT INR: INR: 2.9

## 2013-09-27 MED ORDER — HYDROCODONE-ACETAMINOPHEN 5-325 MG PO TABS: 1.0000 | ORAL_TABLET | Freq: Three times a day (TID) | ORAL | Status: DC | PRN

## 2013-09-27 MED ORDER — SITAGLIPTIN PHOSPHATE 50 MG PO TABS: 50.0000 mg | ORAL_TABLET | Freq: Every day | ORAL | Status: DC

## 2013-09-27 NOTE — Patient Instructions (Signed)
Health Maintenance Summary    PNEUMOCOCCAL POLYSACCHARIDE VACCINE AGE 77 AND OVER Postponed 03/14/2014 Due now - Prevnar - patient thinking about getting in future    ZOSTAVAX Postponed 03/14/2014 Due now - patient thinking about getting in future    TETANUS/TDAP Postponed 03/14/2014     INFLUENZA VACCINE Next Due 03/10/2014  Last was 05/11/2013    HEMOGLOBIN A1C Next Due 03/14/2014  Last done 09/14/2013 was 5.1%    OPHTHALMOLOGY EXAM Next Due 03/23/2014  Last was 03/23/2013    URINE MICROALBUMIN Next Due 05/11/2014  last was 05/14/2013 and was normal   BONE Density Looking into When last Done     FOOT EXAM Next Due 09/14/2014      COLONOSCOPY Next Due 10/12/2021  Last 10/13/2011   Anticoagulation Dose Instructions as of 09/27/2013     Dorene Grebe Tue Wed Thu Fri Sat   New Dose 1 mg 1 mg 1 mg 2 mg 1 mg 1 mg 1 mg    Description       Continue same warfarin dose of 1 tablet on wednesdays and 1/2 tablet all other days.      INR was 2.9 today      Preventive Care for Adults, Female A healthy lifestyle and preventive care can promote health and wellness. Preventive health guidelines for women include the following key practices.  A routine yearly physical is a good way to check with your health care provider about your health and preventive screening. It is a chance to share any concerns and updates on your health and to receive a thorough exam.  Visit your dentist for a routine exam and preventive care every 6 months. Brush your teeth twice a day and floss once a day. Good oral hygiene prevents tooth decay and gum disease.  The frequency of eye exams is based on your age, health, family medical history, use of contact lenses, and other factors. Follow your health care provider's recommendations for frequency of eye exams. You should have your vision check at least once a year.  Eat a healthy diet. Foods like vegetables, fruits, whole grains, low-fat dairy products, and lean protein foods contain the  nutrients you need without too many calories. Decrease your intake of foods high in solid fats, added sugars, and salt. Eat the right amount of calories for you.Get information about a proper diet from your health care provider, if necessary.  Regular physical exercise is one of the most important things you can do for your health. Most adults should get at least 150 minutes of moderate-intensity exercise (any activity that increases your heart rate and causes you to sweat) each week. In addition, most adults need muscle-strengthening exercises on 2 or more days a week.  Maintain a healthy weight. The body mass index (BMI) is a screening tool to identify possible weight problems. It provides an estimate of body fat based on height and weight. Your health care provider can find your BMI, and can help you achieve or maintain a healthy weight.For adults 20 years and older:  A BMI below 18.5 is considered underweight.  A BMI of 18.5 to 24.9 is normal.  A BMI of 25 to 29.9 is considered overweight.  A BMI of 30 and above is considered obese.  Maintain normal blood lipids and cholesterol levels by exercising and minimizing your intake of saturated fat. Eat a balanced diet with plenty of fruit and vegetables.  If your lipid or cholesterol levels are high, you are over 50,  or you are at high risk for heart disease, you may need your cholesterol levels checked every 3-6 months.  If you smoke, find out from your health care provider how to quit. If you do not use tobacco, do not start.  Lung cancer screening is recommended for adults aged 52  - 76 years who are at high risk for developing lung cancer because of a history of smoking. A yearly low-dose CT scan of the lungs is recommended for people who have at least a 30-pack-year history of smoking and are a current smoker or have quit within the past 15 years. A pack year of smoking is smoking an average of 1 pack of cigarettes a day for 1 year (for  example: 1 pack a day for 30 years or 2 packs a day for 15 years). Yearly screening should continue until the smoker has stopped smoking for at least 15 years. Yearly screening should be stopped for people who develop a health problem that would prevent them from having lung cancer treatment.  Avoid use of street drugs. Do not share needles with anyone. Ask for help if you need support or instructions about stopping the use of drugs.  High blood pressure causes heart disease and increases the risk of stroke. Your blood pressure should be checked at least every 1 to 2 years. Ongoing high blood pressure should be treated with medicines if weight loss and exercise do not work.  If you are 79 - 80 years old, ask your health care provider if you should take aspirin to prevent strokes.  Breast cancer screening is essential preventive care for women. You should practice "breast self-awareness." This means understanding the normal appearance and feel of your breasts and may include breast self-examination. Any changes detected, no matter how small, should be reported to a health care provider. Women in their 87s and 30s should have a clinical breast exam (CBE) by a health care provider as part of a regular health exam every 1 to 3 years. After age 53, women should have a CBE every year. Starting at age 32, women should consider having a mammogram (breast X-ray test) every year. Women who have a family history of breast cancer should talk to their health care provider about genetic screening. Women at a high risk of breast cancer should talk to their health care providers about having an MRI and a mammogram every year.  The Pap test is a screening test for cervical cancer. A Pap test can show cell changes on the cervix that might become cervical cancer if left untreated. A Pap test is a procedure in which cells are obtained and examined from the lower end of the uterus (cervix).  Women should have a Pap test  starting at age 68.  Between ages 74 and 38, Pap tests should be repeated every 2 years.  Beginning at age 41, you should have a Pap test every 3 years as long as the past 3 Pap tests have been normal.  Some women have medical problems that increase the chance of getting cervical cancer. Talk to your health care provider about these problems. It is especially important to talk to your health care provider if a new problem develops soon after your last Pap test. In these cases, your health care provider may recommend more frequent screening and Pap tests.  If you are between ages 49 and 3 years, and you have had normal Pap tests going back 10 years, you no longer need  Pap tests. Even if you no longer need a Pap test, a regular exam is a good idea to make sure no other problems are starting.  If Pap tests have been discontinued, risk factors (such as a new sexual partner) need to be reassessed to determine if screening should be resumed.  Colorectal cancer can be detected and often prevented. Most routine colorectal cancer screening begins at the age of 49 years and continues through age 57 years. However, your health care provider may recommend screening at an earlier age if you have risk factors for colon cancer. On a yearly basis, your health care provider may provide home test kits to check for hidden blood in the stool. Use of a small camera at the end of a tube, to directly examine the colon (sigmoidoscopy or colonoscopy), can detect the earliest forms of colorectal cancer. Talk to your health care provider about this at age 17, when routine screening begins. Direct exam of the colon should be repeated every 5 10 years through age 104 years, unless early forms of pre-cancerous polyps or small growths are found.  People who are at an increased risk for hepatitis B should be screened for this virus. You are considered at high risk for hepatitis B if:  You were born in a country where hepatitis B  occurs often. Talk with your health care provider about which countries are considered high risk.  Your parents were born in a high-risk country and you have not received a shot to protect against hepatitis B (hepatitis B vaccine).  You have HIV or AIDS.  You use needles to inject street drugs.  You live with, or have sex with, someone who has Hepatitis B.  You get hemodialysis treatment.  You take certain medicines for conditions like cancer, organ transplantation, and autoimmune conditions.  Hepatitis C blood testing is recommended for all people born from [birth year] through 1963 and any individual with known risks for hepatitis C.  Practice safe sex. Use condoms and avoid high-risk sexual practices to reduce the spread of sexually transmitted infections (STIs). STIs include gonorrhea, chlamydia, syphilis, trichomonas, herpes, HPV, and human immunodeficiency virus (HIV). Herpes, HIV, and HPV are viral illnesses that have no cure. They can result in disability, cancer, and death. Sexually active women aged 73 years and younger should be checked for chlamydia. Older women with new or multiple partners should also be tested for chlamydia. Testing for other STIs is recommended if you are sexually active and at increased risk.  Osteoporosis is a disease in which the bones lose minerals and strength with aging. This can result in serious bone fractures or breaks. The risk of osteoporosis can be identified using a bone density scan. Women ages 40 years and over and women at risk for fractures or osteoporosis should discuss screening with their health care providers. Ask your health care provider whether you should take a calcium supplement or vitamin D to reduce the rate of osteoporosis.  Menopause can be associated with physical symptoms and risks. Hormone replacement therapy is available to decrease symptoms and risks. You should talk to your health care provider about whether hormone replacement therapy  is right for you.  Use sunscreen. Apply sunscreen liberally and repeatedly throughout the day. You should seek shade when your shadow is shorter than you. Protect yourself by wearing long sleeves, pants, a wide-brimmed hat, and sunglasses year round, whenever you are outdoors.  Once a month, do a whole body skin exam, using a mirror to  look at the skin on your back. Tell your health care provider of new moles, moles that have irregular borders, moles that are larger than a pencil eraser, or moles that have changed in shape or color.  Stay current with required vaccines (immunizations).  Influenza vaccine. All adults should be immunized every year.  Tetanus, diphtheria, and acellular pertussis (Td, Tdap) vaccine. Pregnant women should receive 1 dose of Tdap vaccine during each pregnancy. The dose should be obtained regardless of the length of time since the last dose. Immunization is preferred during the 27th 36th week of gestation. An adult who has not previously received Tdap or who does not know her vaccine status should receive 1 dose of Tdap. This initial dose should be followed by tetanus and diphtheria toxoids (Td) booster doses every 10 years. Adults with an unknown or incomplete history of completing a 3-dose immunization series with Td-containing vaccines should begin or complete a primary immunization series including a Tdap dose. Adults should receive a Td booster every 10 years.  Varicella vaccine. An adult without evidence of immunity to varicella should receive 2 doses or a second dose if she has previously received 1 dose. Pregnant females who do not have evidence of immunity should receive the first dose after pregnancy. This first dose should be obtained before leaving the health care facility. The second dose should be obtained 4 to 8 weeks after the first dose.[name]  Zoster vaccine. One dose is recommended for adults aged 71 years or older unless certain conditions are  present.  Pneumococcal 13-valent conjugate (PCV13) vaccine. When indicated, a person who is uncertain of her immunization history and has no record of immunization should receive the PCV13 vaccine. An adult aged 41 years or older who has certain medical conditions and has not been previously immunized should receive 1 dose of PCV13 vaccine. This PCV13 should be followed with a dose of pneumococcal polysaccharide (PPSV23) vaccine. The PPSV23 vaccine dose should be obtained at least 8 weeks after the dose of PCV13 vaccine. An adult aged 35 years or older who has certain medical conditions and previously received 1 or more doses of PPSV23 vaccine should receive 1 dose of PCV13. The PCV13 vaccine dose should be obtained 1 or more years after the last PPSV23 vaccine dose.  Pneumococcal polysaccharide (PPSV23) vaccine. When PCV13 is also indicated, PCV13 should be obtained first. All adults aged 50 years and older should be immunized. An adult younger than age 68 years who has certain medical conditions should be immunized. Any person who resides in a nursing home or long-term care facility should be immunized. An adult smoker should be immunized. People with an immunocompromised condition and certain other conditions should receive both PCV13 and PPSV23 vaccines. People with human immunodeficiency virus (HIV) infection should be immunized as soon as possible after diagnosis. Immunization during chemotherapy or radiation therapy should be avoided. Routine use of PPSV23 vaccine is not recommended for American Indians, [place] Natives, or people younger than 65 years unless there are medical conditions that require PPSV23 vaccine. When indicated, people who have unknown immunization and have no record of immunization should receive PPSV23 vaccine. One-time revaccination 5 years after the first dose of PPSV23 is recommended for people aged 36 64 years who have chronic kidney failure, nephrotic syndrome, asplenia, or  immunocompromised conditions. People who received 1 2 doses of PPSV23 before age 35 years should receive another dose of PPSV23 vaccine at age 29 years or later if at least 5 years have  passed since the previous dose. Doses of PPSV23 are not needed for people immunized with PPSV23 at or after age 27 years.  Hepatitis A vaccine. Adults who wish to be protected from this disease, have certain high-risk conditions, work with hepatitis A-infected animals, work in hepatitis A research labs, or travel to or work in countries with a high rate of hepatitis A should be immunized. Adults who were previously unvaccinated and who anticipate close contact with an international adoptee during the first 60 days after arrival in the [place] from a country with a high rate of hepatitis A should be immunized.  Hepatitis B vaccine. Adults who wish to be protected from this disease, have certain high-risk conditions, may be exposed to blood or other infectious body fluids, are household contacts or sex partners of hepatitis B positive people, are clients or workers in certain care facilities, or travel to or work in countries with a high rate of hepatitis B should be immunized.    Shingles Vaccine What You Need to Know WHAT IS SHINGLES?  Shingles is a painful skin rash, often with blisters. It is also called Herpes Zoster or just Zoster.  A shingles rash usually appears on one side of the face or body and lasts from 2 to 4 weeks. Its main symptom is pain, which can be quite severe. Other symptoms of shingles can include fever, headache, chills, and upset stomach. Very rarely, a shingles infection can lead to pneumonia, hearing problems, blindness, brain inflammation (encephalitis), or death.  For about 1 person in 5, severe pain can continue even after the rash clears up. This is called post-herpetic neuralgia.  Shingles is caused by the Varicella Zoster virus. This is the same virus that causes chickenpox. Only  someone who has had a case of chickenpox or rarely, has gotten chickenpox vaccine, can get shingles. The virus stays in your body. It can reappear many years later to cause a case of shingles.  You cannot catch shingles from another person with shingles. However, a person who has never had chickenpox (or chickenpox vaccine) could get chickenpox from someone with shingles. This is not very common.  Shingles is far more common in people 20 and older than in younger people. It is also more common in people whose immune systems are weakened because of a disease such as cancer or drugs such as steroids or chemotherapy.  At least 1 million people get shingles per year in the [place]. SHINGLES VACCINE  A vaccine for shingles was licensed in 123456. In clinical trials, the vaccine reduced the risk of shingles by 50%. It can also reduce the pain in people who still get shingles after being vaccinated.  A single dose of shingles vaccine is recommended for adults 45 years of age and older. SOME PEOPLE SHOULD NOT GET SHINGLES VACCINE OR SHOULD WAIT A person should not get shingles vaccine if he or she:  Has ever had a life-threatening allergic reaction to gelatin, the antibiotic neomycin, or any other component of shingles vaccine. Tell your caregiver if you have any severe allergies.  Has a weakened immune system because of current:  AIDS or another disease that affects the immune system.  Treatment with drugs that affect the immune system, such as prolonged use of high-dose steroids.  Cancer treatment, such as radiation or chemotherapy.  Cancer affecting the bone marrow or lymphatic system, such as leukemia or lymphoma.  Is pregnant, or might be pregnant. Women should not become pregnant until at  least 4 weeks after getting shingles vaccine. Someone with a minor illness, such as a cold, may be vaccinated. Anyone with a moderate or severe acute illness should usually wait until he or she  recovers before getting the vaccine. This includes anyone with a temperature of 101.3 F (38 C) or higher. WHAT ARE THE RISKS FROM SHINGLES VACCINE?  A vaccine, like any medicine, could possibly cause serious problems, such as severe allergic reactions. However, the risk of a vaccine causing serious harm, or death, is extremely small.  No serious problems have been identified with shingles vaccine. Mild Problems  Redness, soreness, swelling, or itching at the site of the injection (about 1 person in 3).  Headache (about 1 person in 69). Like all vaccines, shingles vaccine is being closely monitored for unusual or severe problems. WHAT IF THERE IS A MODERATE OR SEVERE REACTION? What should I look for? Any unusual condition, such as a severe allergic reaction or a high fever. If a severe allergic reaction occurred, it would be within a few minutes to an hour after the shot. Signs of a serious allergic reaction can include difficulty breathing, weakness, hoarseness or wheezing, a fast heartbeat, hives, dizziness, paleness, or swelling of the throat. What should I do?  Call your caregiver, or get the person to a caregiver right away.  Tell the caregiver what happened, the date and time it happened, and when the vaccination was given.  Ask the caregiver to report the reaction by filing a Vaccine Adverse Event Reporting System (VAERS) form. Or, you can file this report through the VAERS web site at www.vaers.SamedayNews.es or by calling (781)498-3323. VAERS does not provide medical advice. HOW CAN I LEARN MORE?  Ask your caregiver. He or she can give you the vaccine package insert or suggest other sources of information.  Contact the Centers for Disease Control and Prevention (CDC):  Call (519)497-7293 (1-800-CDC-INFO).  Visit the CDC website at http://hunter.com/ CDC Shingles Vaccine VIS (05/15/08) Document Released: 05/24/2006 Document Revised: 10/19/2011 Document Reviewed:  11/16/2012 [facility] Patient Information 2014 [place].   Pneumococcal Vaccine, Polyvalent suspension for injection What is this medicine? PNEUMOCOCCAL VACCINE, POLYVALENT (NEU mo KOK al vak SEEN, pol ee VEY luhnt) is a vaccine to prevent pneumococcus bacteria infection. These bacteria are a major cause of ear infections, 'Strep throat' infections, and serious pneumonia, meningitis, or blood infections worldwide. These vaccines help the body to produce antibodies (protective substances) that help your body defend against these bacteria. This vaccine is recommended for infants and young children. This vaccine will not treat an infection. This medicine may be used for other purposes; ask your health care provider or pharmacist if you have questions. COMMON BRAND NAME(S): Prevnar 13 , Prevnar What should I tell my health care provider before I take this medicine? They need to know if you have any of these conditions: -bleeding problems -fever -immune system problems -low platelet count in the blood -seizures -an unusual or allergic reaction to pneumococcal vaccine, diphtheria toxoid, other vaccines, latex, other medicines, foods, dyes, or preservatives -pregnant or trying to get pregnant -breast-feeding How should I use this medicine? This vaccine is for injection into a muscle. It is given by a health care professional. A copy of Vaccine Information Statements will be given before each vaccination. Read this sheet carefully each time. The sheet may change frequently. Talk to your pediatrician regarding the use of this medicine in children. While this drug may be prescribed for children as young as 6 weeks  old for selected conditions, precautions do apply. Overdosage: If you think you have taken too much of this medicine contact a poison control center or emergency room at once. NOTE: This medicine is only for you. Do not share this medicine with others. What if I miss a dose? It is  important not to miss your dose. Call your doctor or health care professional if you are unable to keep an appointment. What may interact with this medicine? -medicines for cancer chemotherapy -medicines that suppress your immune function -medicines that treat or prevent blood clots like warfarin, enoxaparin, and dalteparin -steroid medicines like prednisone or cortisone This list may not describe all possible interactions. Give your health care provider a list of all the medicines, herbs, non-prescription drugs, or dietary supplements you use. Also tell them if you smoke, drink alcohol, or use illegal drugs. Some items may interact with your medicine. What should I watch for while using this medicine? Mild fever and pain should go away in 3 days or less. Report any unusual symptoms to your doctor or health care professional. What side effects may I notice from receiving this medicine? Side effects that you should report to your doctor or health care professional as soon as possible: -allergic reactions like skin rash, itching or hives, swelling of the face, lips, or tongue -breathing problems -confused -fever over 102 degrees F -pain, tingling, numbness in the hands or feet -seizures -unusual bleeding or bruising -unusual muscle weakness Side effects that usually do not require medical attention (report to your doctor or health care professional if they continue or are bothersome): -aches and pains -diarrhea -fever of 102 degrees F or less -headache -irritable -loss of appetite -pain, tender at site where injected -trouble sleeping This list may not describe all possible side effects. Call your doctor for medical advice about side effects. You may report side effects to FDA at 1-800-FDA-1088. Where should I keep my medicine? This does not apply. This vaccine is given in a clinic, pharmacy, doctor's office, or other health care setting and will not be stored at home. NOTE: This sheet  is a summary. It may not cover all possible information. If you have questions about this medicine, talk to your doctor, pharmacist, or health care provider.  2014, [employer] Standard. (2008-10-09 10:17:22)

## 2013-09-27 NOTE — Progress Notes (Signed)
cc'd to pcp 

## 2013-09-27 NOTE — Progress Notes (Signed)
Patient ID: [name], female   DOB: Nov 11, 1934, 79 y.o.   MRN: [id number] Subjective:    [name] is a 79 y.o. female who presents for Medicare Initial preventive examination.  Preventive Screening-Counseling & Management  Tobacco History  Smoking status  . Current Every Day Smoker -- 0.50 packs/day for 45 years  . Types: Cigarettes  . Last Attempt to Quit: 08/10/2006  Smokeless tobacco  . Never Used    Comment: smokes 2-3 cigarettes daily     Current Problems (verified) Patient Active Problem List   Diagnosis Date Noted  . Bloating 09/25/2013  . GERD (gastroesophageal reflux disease) 07/13/2013  . Diarrhea 07/13/2013  . Warfarin anticoagulation 06/27/2013  . Mitral regurgitation 06/27/2013  . Aortic stenosis 06/27/2013  . COPD with acute exacerbation 06/27/2013  . Acute on chronic diastolic CHF (congestive heart failure) 06/27/2013  . Vitamin D deficiency   . Peripheral edema 07/21/2012  . Carotid artery disease   . Tobacco abuse 05/12/2012  . Hypoglycemia 05/11/2012  . Dysuria 10/21/2011  . Abdominal wall hernia 10/19/2011  . Constipation 09/29/2011  . Abnormal CT of the abdomen 09/29/2011  . Abdominal pain 09/29/2011  . Weight loss 09/29/2011  . CAD (coronary artery disease)   . Abnormal CT of the chest   . Pacemaker-St.Jude   . Tachycardia-bradycardia syndrome 12/03/2010  . Chronic anticoagulation 12/03/2010  . Anxiety 12/03/2010  . Mitral stenosis 05/30/2010  . ANEMIA 05/16/2010  . Type II or unspecified type diabetes mellitus without mention of complication, not stated as uncontrolled 01/19/2009  . HYPERTENSION 01/19/2009  . Atrial fibrillation 01/19/2009  . CHF 01/19/2009  . COPD 01/19/2009  . HEART MURMUR, BENIGN 01/19/2009    Medications Prior to Visit Current Outpatient Prescriptions on File Prior to Visit  Medication Sig Dispense Refill  . budesonide-formoterol (SYMBICORT) 160-4.5 MCG/ACT inhaler Inhale 2 puffs into the lungs 2  (two) times daily.  1 Inhaler  5  . clonazePAM (KLONOPIN) 0.5 MG tablet Take 0.5 mg by mouth 3 (three) times daily as needed for anxiety.      . furosemide (LASIX) 40 MG tablet Take 1 tablet (40 mg total) by mouth daily.  30 tablet  1  . metoprolol tartrate (LOPRESSOR) 25 MG tablet Take 1 tablet (25 mg total) by mouth 2 (two) times daily.  60 tablet  11  . omeprazole (PRILOSEC) 40 MG capsule Take 40 mg by mouth 2 (two) times daily.      [name] PROAIR HFA 108 (90 BASE) MCG/ACT inhaler USE 2 PUFFS BY MOUTH EVERY 3   TO 4 HOURS WHILE AWAKE AS NEEDED  9 each  0  . Probiotic Product (ALIGN) 4 MG CAPS Take 1 capsule (4 mg total) by mouth daily.  28 capsule  0  . simvastatin (ZOCOR) 10 MG tablet Take 10 mg by mouth at bedtime.      [name] tiotropium (SPIRIVA) 18 MCG inhalation capsule Place 18 mcg into inhaler and inhale at bedtime.      [name] warfarin (COUMADIN) 2 MG tablet Take 1-2 mg by mouth See admin instructions. Takes one tablet (2 mg total) on Wednesdays, then take one-half tablet (1mg  total) on all other days      . zafirlukast (ACCOLATE) 20 MG tablet Take 20 mg by mouth 2 (two) times daily.      . bisacodyl (DULCOLAX) 5 MG EC tablet Take 5 mg by mouth daily as needed for constipation.       No current facility-administered medications on file prior to  visit.    Current Medications (verified) Current Outpatient Prescriptions  Medication Sig Dispense Refill  . budesonide-formoterol (SYMBICORT) 160-4.5 MCG/ACT inhaler Inhale 2 puffs into the lungs 2 (two) times daily.  1 Inhaler  5  . clonazePAM (KLONOPIN) 0.5 MG tablet Take 0.5 mg by mouth 3 (three) times daily as needed for anxiety.      [name] diltiazem (CARDIZEM CD) 120 MG 24 hr capsule Take 120 mg by mouth daily.      . furosemide (LASIX) 40 MG tablet Take 1 tablet (40 mg total) by mouth daily.  30 tablet  1  . HYDROcodone-acetaminophen (NORCO/VICODIN) 5-325 MG per tablet Take 1 tablet by mouth 3 (three) times daily as needed for moderate pain.  90 tablet   0  . metoprolol tartrate (LOPRESSOR) 25 MG tablet Take 1 tablet (25 mg total) by mouth 2 (two) times daily.  60 tablet  11  . omeprazole (PRILOSEC) 40 MG capsule Take 40 mg by mouth 2 (two) times daily.      [name] PROAIR HFA 108 (90 BASE) MCG/ACT inhaler USE 2 PUFFS BY MOUTH EVERY 3   TO 4 HOURS WHILE AWAKE AS NEEDED  9 each  0  . Probiotic Product (ALIGN) 4 MG CAPS Take 1 capsule (4 mg total) by mouth daily.  28 capsule  0  . simvastatin (ZOCOR) 10 MG tablet Take 10 mg by mouth at bedtime.      [name] tiotropium (SPIRIVA) 18 MCG inhalation capsule Place 18 mcg into inhaler and inhale at bedtime.      [name] warfarin (COUMADIN) 2 MG tablet Take 1-2 mg by mouth See admin instructions. Takes one tablet (2 mg total) on Wednesdays, then take one-half tablet (1mg  total) on all other days      . zafirlukast (ACCOLATE) 20 MG tablet Take 20 mg by mouth 2 (two) times daily.      . bisacodyl (DULCOLAX) 5 MG EC tablet Take 5 mg by mouth daily as needed for constipation.      . sitaGLIPtin (JANUVIA) 50 MG tablet Take 1 tablet (50 mg total) by mouth daily.  30 tablet  1   No current facility-administered medications for this visit.     Allergies (verified) Torsemide and Tramadol   PAST HISTORY  Family History Family History  Problem Relation Age of Onset  . Cancer Mother     Uterus  . Stroke Mother   . Cancer Daughter     kidney cancer, in remission  . Heart disease Father   . Heart attack Father   . Colon cancer Neg Hx   . Early death Sister 1    died at 66 months old  . Hypertension Brother   . Cancer Paternal Aunt     breast    Social History History  Substance Use Topics  . Smoking status: Current Every Day Smoker -- 0.50 packs/day for 45 years    Types: Cigarettes    Last Attempt to Quit: 08/10/2006  . Smokeless tobacco: Never Used     Comment: smokes 2-3 cigarettes daily  . Alcohol Use: No     Are there smokers in your home (other than you)? No - lives alone Patient is a smoker  Risk  Factors Current exercise habits: Home exercise routine includes stretching.  Dietary issues discussed: limiting fat and CHO indiet   Cardiac risk factors: advanced age (older than 68 for men, 58 for women), diabetes mellitus, dyslipidemia, family history of premature cardiovascular disease, sedentary lifestyle and smoking/  tobacco exposure.  Depression Screen (Note: if answer to either of the following is "Yes", a more complete depression screening is indicated)   Over the past 2 weeks, have you felt down, depressed or hopeless? Yes   Over the past 2 weeks, have you felt little interest or pleasure in doing things? No  Have you lost interest or pleasure in daily life? No  Do you often feel hopeless? No  Do you cry easily over simple problems? No  Activities of Daily Living In your present state of health, do you have any difficulty performing the following activities?:  Driving? Yes - never has driven Managing money?  No Feeding yourself? No Getting from bed to chair? No Climbing a flight of stairs? Yes - uses 4 pronged cane Preparing food and eating?: No Bathing or showering? No Getting dressed: No Getting to the toilet? No Using the toilet:No Moving around from place to place: No In the past year have you fallen or had a near fall?:No   Are you sexually active?  No  Do you have more than one partner?  No  Hearing Difficulties: No Do you often ask people to speak up or repeat themselves? No Do you experience ringing or noises in your ears? No Do you have difficulty understanding soft or whispered voices? No   Do you feel that you have a problem with memory? No  Do you often misplace items? No  Do you feel safe at home?  Yes  Cognitive Testing  Alert? Yes  Normal Appearance?Yes  Oriented to person? Yes  Place? Yes   Time? Yes  Recall of three objects?  No  Can perform simple calculations? Yes  Displays appropriate judgment?Yes  Can read the correct time from a watch  face?Yes   Advanced Directives have been discussed with the patient? Yes  List the Names of Other Physician/Practitioners you currently use: 1.  Steffanie Rainwater - podiatrist 2. Sandi Fields and [place] 3.  Kate Sable - cardiologist 4.  Allred - Cardiologist / Pacemaker 5.  Mayford Knife - optometrist ([email or web address])   Indicate any recent Medical Services you may have received from other than [facility] providers in the past year (date may be approximate).  Immunization History  Administered Date(s) Administered  . Influenza Split 05/12/2012  . Influenza,inj,Quad PF,36+ Mos 05/11/2013    Screening Tests Health Maintenance  Topic Date Due  . Pneumococcal Polysaccharide Vaccine Age 10 And Over  03/14/2014 (Originally 02/05/2002)  . Zostavax  03/14/2014 (Originally 02/05/1997)  . Tetanus/tdap  03/14/2014 (Originally 02/06/1956)  . Influenza Vaccine  03/10/2014  . Hemoglobin A1c  03/14/2014  . Ophthalmology Exam  03/23/2014  . Urine Microalbumin  05/11/2014  . Foot Exam  09/14/2014  . Colonoscopy  10/12/2021  Glaucoma screening last by Dr. Mayford Knife 03/2013 DEXA last 11/29/2006  All answers were reviewed with the patient and necessary referrals were made:  Cherre Robins, [facility]   09/27/2013   History reviewed: allergies, current medications, past family history, past medical history, past social history, past surgical history and problem list    Objective:      Body mass index is 27.98 kg/(m^2). BP 110/70  Pulse 68  Ht 5\' 1"  (1.549 m)  Wt 148 lb (67.132 kg)  BMI 27.98 kg/m2  Last A1C = 5.1%  Patient reports hypoglycemic events about once every 2 weeks  INR today is 2.9   Assessment:     Medicare Annual Wellness visti Medication management -  patient on glyburide which is high risk in elderly with increased risk of hypolycemia Therapeutic anticoagulation      Plan:     During the course of the visit the patient was  educated and counseled about appropriate screening and preventive services including:    Pneumococcal vaccine   Influenza vaccine  Hepatitis B vaccine  Td vaccine  Screening mammography  Bone densitometry screening  Colorectal cancer screening  Glaucoma screening  Nutrition counseling   Smoking cessation counseling  Advanced directives: patient given paper work for advanced directives and discussed  Discussed limiting high fat and high CHO foods - patient declined nutrition referral.  Referral to have DEXA rechecked made.  Discontinue glyburide, start Januvia 50mg  1 tablet daily. Anticoagulation Dose Instructions as of 09/27/2013     Dorene Grebe Tue Wed Thu Fri Sat   New Dose 1 mg 1 mg 1 mg 2 mg 1 mg 1 mg 1 mg    Description       Continue same warfarin dose of 1 tablet on wednesdays and 1/2 tablet all other days.     RTC in 1 month.   Patient Instructions (the written plan) was given to the patient.  Medicare Attestation I have personally reviewed: The patient's medical and social history Their use of alcohol, tobacco or illicit drugs Their current medications and supplements The patient's functional ability including ADLs,fall risks, home safety risks, cognitive, and hearing and visual impairment Diet and physical activities Evidence for depression or mood disorders  The patient's weight, height, BMI, and visual acuity have been recorded in the chart.  I have made referrals, counseling, and provided education to the patient based on review of the above and I have provided the patient with a written personalized care plan for preventive services.     Cherre Robins, [facility]   09/27/2013

## 2013-10-02 ENCOUNTER — Telehealth: Payer: Self-pay

## 2013-10-02 MED ORDER — PANCRELIPASE (LIP-PROT-AMYL) 36000-114000 UNITS PO CPEP: ORAL_CAPSULE | ORAL | Status: DC

## 2013-10-02 NOTE — Telephone Encounter (Signed)
Pt is aware. Routing to [facility] to schedule the OV with SF in 2-3 weeks.

## 2013-10-02 NOTE — Telephone Encounter (Signed)
Pt is aware of OV and appt card was mailed °

## 2013-10-02 NOTE — Telephone Encounter (Signed)
Pt called and said to let Neil Crouch, PA know that she is still having abdominal pain. She is taking the probiotic. She is not having diarrhea, but has not had a good BM for about 3 days. Please advise!

## 2013-10-02 NOTE — Telephone Encounter (Signed)
Continue probiotics. I would recommend trial of pancreatic enzymes, creon two with meals and 1 with snacks. rx sent to pharmacy.  Needs OV with [facility] only in 2-3 weeks.

## 2013-10-07 ENCOUNTER — Telehealth: Payer: Self-pay | Admitting: Internal Medicine

## 2013-10-07 ENCOUNTER — Emergency Department (HOSPITAL_COMMUNITY): Payer: Medicare Other

## 2013-10-07 ENCOUNTER — Encounter (HOSPITAL_COMMUNITY): Payer: Self-pay | Admitting: Emergency Medicine

## 2013-10-07 ENCOUNTER — Emergency Department (HOSPITAL_COMMUNITY)
Admission: EM | Admit: 2013-10-07 | Discharge: 2013-10-07 | Disposition: A | Discharge status: Home / Self Care | Payer: Medicare Other | Attending: Emergency Medicine | Admitting: Emergency Medicine

## 2013-10-07 DIAGNOSIS — I509 Heart failure, unspecified: Secondary | ICD-10-CM | POA: Insufficient documentation

## 2013-10-07 DIAGNOSIS — F17200 Nicotine dependence, unspecified, uncomplicated: Secondary | ICD-10-CM | POA: Diagnosis not present

## 2013-10-07 DIAGNOSIS — J4489 Other specified chronic obstructive pulmonary disease: Secondary | ICD-10-CM | POA: Insufficient documentation

## 2013-10-07 DIAGNOSIS — K6389 Other specified diseases of intestine: Secondary | ICD-10-CM | POA: Diagnosis not present

## 2013-10-07 DIAGNOSIS — I2510 Atherosclerotic heart disease of native coronary artery without angina pectoris: Secondary | ICD-10-CM | POA: Insufficient documentation

## 2013-10-07 DIAGNOSIS — K219 Gastro-esophageal reflux disease without esophagitis: Secondary | ICD-10-CM | POA: Insufficient documentation

## 2013-10-07 DIAGNOSIS — R1013 Epigastric pain: Secondary | ICD-10-CM | POA: Diagnosis not present

## 2013-10-07 DIAGNOSIS — Z79899 Other long term (current) drug therapy: Secondary | ICD-10-CM | POA: Diagnosis not present

## 2013-10-07 DIAGNOSIS — N182 Chronic kidney disease, stage 2 (mild): Secondary | ICD-10-CM | POA: Insufficient documentation

## 2013-10-07 DIAGNOSIS — J449 Chronic obstructive pulmonary disease, unspecified: Secondary | ICD-10-CM | POA: Insufficient documentation

## 2013-10-07 DIAGNOSIS — Z7901 Long term (current) use of anticoagulants: Secondary | ICD-10-CM | POA: Insufficient documentation

## 2013-10-07 DIAGNOSIS — E7800 Pure hypercholesterolemia, unspecified: Secondary | ICD-10-CM | POA: Insufficient documentation

## 2013-10-07 DIAGNOSIS — I129 Hypertensive chronic kidney disease with stage 1 through stage 4 chronic kidney disease, or unspecified chronic kidney disease: Secondary | ICD-10-CM | POA: Diagnosis not present

## 2013-10-07 DIAGNOSIS — E119 Type 2 diabetes mellitus without complications: Secondary | ICD-10-CM | POA: Diagnosis not present

## 2013-10-07 DIAGNOSIS — K319 Disease of stomach and duodenum, unspecified: Secondary | ICD-10-CM | POA: Diagnosis not present

## 2013-10-07 DIAGNOSIS — R109 Unspecified abdominal pain: Secondary | ICD-10-CM | POA: Diagnosis not present

## 2013-10-07 DIAGNOSIS — Z87440 Personal history of urinary (tract) infections: Secondary | ICD-10-CM | POA: Diagnosis not present

## 2013-10-07 DIAGNOSIS — Z950 Presence of cardiac pacemaker: Secondary | ICD-10-CM | POA: Diagnosis not present

## 2013-10-07 DIAGNOSIS — R198 Other specified symptoms and signs involving the digestive system and abdomen: Secondary | ICD-10-CM

## 2013-10-07 DIAGNOSIS — Z862 Personal history of diseases of the blood and blood-forming organs and certain disorders involving the immune mechanism: Secondary | ICD-10-CM | POA: Insufficient documentation

## 2013-10-07 DIAGNOSIS — I10 Essential (primary) hypertension: Secondary | ICD-10-CM | POA: Diagnosis not present

## 2013-10-07 DIAGNOSIS — I4891 Unspecified atrial fibrillation: Secondary | ICD-10-CM | POA: Insufficient documentation

## 2013-10-07 LAB — CBC WITH DIFFERENTIAL/PLATELET
Basophils Absolute: 0 10*3/uL (ref 0.0–0.1)
Basophils Relative: 0 % (ref 0–1)
EOS ABS: 0.1 10*3/uL (ref 0.0–0.7)
EOS PCT: 1 % (ref 0–5)
HCT: 40.2 % (ref 36.0–46.0)
HEMOGLOBIN: 13.9 g/dL (ref 12.0–15.0)
LYMPHS ABS: 2.7 10*3/uL (ref 0.7–4.0)
Lymphocytes Relative: 31 % (ref 12–46)
MCH: 34.1 pg — AB (ref 26.0–34.0)
MCHC: 34.6 g/dL (ref 30.0–36.0)
MCV: 98.5 fL (ref 78.0–100.0)
MONO ABS: 0.5 10*3/uL (ref 0.1–1.0)
MONOS PCT: 5 % (ref 3–12)
Neutro Abs: 5.3 10*3/uL (ref 1.7–7.7)
Neutrophils Relative %: 62 % (ref 43–77)
Platelets: 188 10*3/uL (ref 150–400)
RBC: 4.08 MIL/uL (ref 3.87–5.11)
RDW: 13.2 % (ref 11.5–15.5)
WBC: 8.5 10*3/uL (ref 4.0–10.5)

## 2013-10-07 LAB — CBG MONITORING, ED: GLUCOSE-CAPILLARY: 127 mg/dL — AB (ref 70–99)

## 2013-10-07 LAB — COMPREHENSIVE METABOLIC PANEL
ALBUMIN: 3.9 g/dL (ref 3.5–5.2)
ALT: 23 U/L (ref 0–35)
AST: 31 U/L (ref 0–37)
Alkaline Phosphatase: 89 U/L (ref 39–117)
BUN: 37 mg/dL — AB (ref 6–23)
CALCIUM: 9.8 mg/dL (ref 8.4–10.5)
CO2: 27 mEq/L (ref 19–32)
CREATININE: 1.44 mg/dL — AB (ref 0.50–1.10)
Chloride: 95 mEq/L — ABNORMAL LOW (ref 96–112)
GFR calc non Af Amer: 34 mL/min — ABNORMAL LOW (ref >90)
GFR, EST AFRICAN AMERICAN: 40 mL/min — AB (ref >90)
GLUCOSE: 132 mg/dL — AB (ref 70–99)
Potassium: 3.9 mEq/L (ref 3.7–5.3)
Sodium: 135 mEq/L — ABNORMAL LOW (ref 137–147)
TOTAL PROTEIN: 7.9 g/dL (ref 6.0–8.3)
Total Bilirubin: 0.3 mg/dL (ref 0.3–1.2)

## 2013-10-07 LAB — CLOSTRIDIUM DIFFICILE BY PCR: Toxigenic C. Difficile by PCR: NEGATIVE

## 2013-10-07 MED ORDER — SIMETHICONE 40 MG/0.6ML PO SUSP
40.0000 mg | Freq: Once | ORAL | Status: AC
  Administered 2013-10-07: 40 mg via ORAL
  Filled 2013-10-07: qty 30

## 2013-10-07 NOTE — ED Notes (Signed)
Pt states abdominal pain described as burning. Also states gurgling of stomach. Denies diarrhea or vomiting. States generalized weakness. Abdominal pain for over a week, PMD believes it is because food is not digesting as it should and prescribed medication for it.

## 2013-10-07 NOTE — ED Provider Notes (Signed)
CSN: [id number]     Arrival date & time 10/07/13  1850 History   First MD Initiated Contact with Patient 10/07/13 1901     Chief Complaint  Patient presents with  . Abdominal Pain     (Consider location/radiation/quality/duration/timing/severity/associated sxs/prior Treatment) HPI Patient reports she has been having trouble with her stomach "for quite a good while". She reports that is over a year. She reports about a year and half ago she colonoscopy showing diverticulosis. She also reports an ultrasound a few months ago. She states they increased her stomach acid pills to twice a day about 2 months ago. She states she notices if she eats greasy foods her discomfort gets worse. She states yesterday she noted her stomach was gurgling and rolling and it has continued throughout today. She has plus or minus pain. She states "I think I'm hurting along the right side". She also states she was started on a pancreas pill 2-3 days ago. She denies any diarrhea. She states her last bowel movement prior to today he was couple days ago. She drank prune juice to get her stool sample for today. She was advised by Dr. Elwyn Reach to bring a stool sample to be tested for C. difficile which she had in the past. She denies any fever, she states she has normal appetite. She denies any dysuria or frequency. She describes the abdominal discomfort as a burning sensation. She also is concerned about her blood pressure and reports today her blood pressure was 137/101 42/79.  PCP Dr Laurance Flatten GI Dr Rourk/Fields  Past Medical History  Diagnosis Date  . Permanent atrial fibrillation     H/o tachybradycardia syndrome on Coumadin anticoagulation, has pacemaker.  [name] CAD (coronary artery disease) 2011    Catheterization August 2011: mild nonobstructive coronary artery disease complicated by large left rectus sheath hematoma status post evacuation Coumadin restarted without recurrence  . Diabetes mellitus   . Chronic airway  obstruction, not elsewhere classified   . Anemia, unspecified   . Chronic kidney disease, stage II (mild)   . Unspecified hypertensive kidney disease with chronic kidney disease stage I through stage IV, or unspecified   . Tachycardia-bradycardia     s/p St. Jude single chamber PPM 10/2010   . GERD (gastroesophageal reflux disease)   . Abnormal CT of the chest     Mediastinal and hilar adenopathy followed by Dr. Koleen Nimrod in [place]  . Congestive heart failure, unspecified     EF now 60%, previous nonischemic cardiomyopathy  . Pacemaker     Single chamber [place]. Jude ACCENT [id number] SN 719 04/11/1957 10/31/2010  . Valvular heart disease     a. H/o moderate mitral stenosis by cath 2011 but no mention of this on 10/2011 echo. b. 10/2011 echo: mod MR, mild AS, mild TR; EF>65%  . Hypercholesterolemia   . Diverticulitis Dec 2012  . Body mass index (BMI) of 20.0-20.9 in adult FEB 2013 147 LBS  . Recurrent UTI   . Neurogenic sleep apnea     Uses noctural oxygen prescribed by her pulmonologi  . Rheumatic heart disease   . Gastritis     By EGD  . Carotid artery disease     Doppler, 05/2012, 0-39% bilateral  . Vitamin D deficiency   . Dilated bile duct 10/19/2011   Past Surgical History  Procedure Laterality Date  . Single-chamber pacemaker implantation  3/12    by Dr Caryl Comes  . Evacuation of retroperitoneal and left rectus sheath    . Cholecystectomy  40+ years ago  . Hernia repair      X3  . Pilonidal cyst / sinus excision    . Esophagogastroduodenoscopy  10/2011    Fields-erosive gastritis, biopsy with no H. pylori, hiatal hernia, peptic duodenitis, no celiac disease, duodenal diverticulum, duodenal nodule  . Colonoscopy  10/2011    Dyann Ruddle sigmoid to descending diverticulosis, internal hemorrhoids  . Eus  11/16/11    Mishra-13 MM CBD, normal pancreatic duct, otherwise normal EUS   Family History  Problem Relation Age of Onset  . Cancer Mother     Uterus  . Stroke Mother   .  Cancer Daughter     kidney cancer, in remission  . Heart disease Father   . Heart attack Father   . Colon cancer Neg Hx   . Early death Sister 1    died at 58 months old  . Hypertension Brother   . Cancer Paternal Aunt     breast   History  Substance Use Topics  . Smoking status: Current Every Day Smoker -- 0.50 packs/day for 45 years    Types: Cigarettes    Last Attempt to Quit: 08/10/2006  . Smokeless tobacco: Never Used     Comment: smokes 2-3 cigarettes daily  . Alcohol Use: No   Lives alone Smokes 1/2 ppd  OB History   Grav Para Term Preterm Abortions TAB SAB Ect Mult Living                 Review of Systems  All other systems reviewed and are negative.      Allergies  Torsemide and Tramadol  Home Medications   Current Outpatient Rx  Name  Route  Sig  Dispense  Refill  . bisacodyl (DULCOLAX) 5 MG EC tablet   Oral   Take 5 mg by mouth daily as needed for constipation.         . budesonide-formoterol (SYMBICORT) 160-4.5 MCG/ACT inhaler   Inhalation   Inhale 2 puffs into the lungs 2 (two) times daily.   1 Inhaler   5   . clonazePAM (KLONOPIN) 0.5 MG tablet   Oral   Take 0.5 mg by mouth 3 (three) times daily as needed for anxiety.         [name] diltiazem (CARDIZEM CD) 120 MG 24 hr capsule   Oral   Take 120 mg by mouth daily.         . furosemide (LASIX) 40 MG tablet   Oral   Take 1 tablet (40 mg total) by mouth daily.   30 tablet   1   . HYDROcodone-acetaminophen (NORCO/VICODIN) 5-325 MG per tablet   Oral   Take 1 tablet by mouth 3 (three) times daily as needed for moderate pain.   90 tablet   0   . metoprolol tartrate (LOPRESSOR) 25 MG tablet   Oral   Take 1 tablet (25 mg total) by mouth 2 (two) times daily.   60 tablet   11   . omeprazole (PRILOSEC) 40 MG capsule   Oral   Take 40 mg by mouth 2 (two) times daily.         . Pancrelipase, Lip-Prot-Amyl, (CREON) 36000 UNITS CPEP   Oral   Take [id number] Units by mouth 3 (three)  times daily with meals. Patient takes 72000 units with meals and 36000 with snaks         . Probiotic Product (ALIGN) 4 MG CAPS   Oral   Take 1 capsule (4  mg total) by mouth daily.   28 capsule   0   . simvastatin (ZOCOR) 10 MG tablet   Oral   Take 10 mg by mouth at bedtime.         . sitaGLIPtin (JANUVIA) 50 MG tablet   Oral   Take 1 tablet (50 mg total) by mouth daily.   30 tablet   1   . tiotropium (SPIRIVA) 18 MCG inhalation capsule   Inhalation   Place 18 mcg into inhaler and inhale at bedtime.         [name] warfarin (COUMADIN) 2 MG tablet   Oral   Take 1-2 mg by mouth daily. Takes one tablet (2 mg total) on Wednesdays, then take one-half tablet (1mg  total) on all other days         . zafirlukast (ACCOLATE) 20 MG tablet   Oral   Take 20 mg by mouth 2 (two) times daily.         [name] PROAIR HFA 108 (90 BASE) MCG/ACT inhaler      USE 2 PUFFS BY MOUTH EVERY 3   TO 4 HOURS WHILE AWAKE AS NEEDED   9 each   0    BP 117/56  Pulse 69  Temp(Src) 98.3 F (36.8 C) (Oral)  Resp 18  Ht 5\' 3"  (1.6 m)  Wt 148 lb (67.132 kg)  BMI 26.22 kg/m2  SpO2 99%  Vital signs normal   Physical Exam  Nursing note and vitals reviewed. Constitutional: She is oriented to person, place, and time. She appears well-developed and well-nourished.  Non-toxic appearance. She does not appear ill. No distress.  HENT:  Head: Normocephalic and atraumatic.  Right Ear: External ear normal.  Left Ear: External ear normal.  Nose: Nose normal. No mucosal edema or rhinorrhea.  Mouth/Throat: Oropharynx is clear and moist and mucous membranes are normal. No dental abscesses or uvula swelling.  Eyes: Conjunctivae and EOM are normal. Pupils are equal, round, and reactive to light.  Neck: Normal range of motion and full passive range of motion without pain. Neck supple.  Cardiovascular: Normal rate, regular rhythm and normal heart sounds.  Exam reveals no gallop and no friction rub.   No murmur  heard. Pulmonary/Chest: Effort normal and breath sounds normal. No respiratory distress. She has no wheezes. She has no rhonchi. She has no rales. She exhibits no tenderness and no crepitus.  Abdominal: Soft. Normal appearance and bowel sounds are normal. She exhibits no distension. There is no tenderness. There is no rebound and no guarding.  Very active loud bowel sounds although her abdomen is very soft and nontender.  Musculoskeletal: Normal range of motion. She exhibits no edema and no tenderness.  Moves all extremities well.   Neurological: She is alert and oriented to person, place, and time. She has normal strength. No cranial nerve deficit.  Skin: Skin is warm, dry and intact. No rash noted. No erythema. No pallor.  Psychiatric: She has a normal mood and affect. Her speech is normal and behavior is normal. Her mood appears not anxious.    ED Course  Procedures (including critical care time)  Medications  simethicone (MYLICON) 40 [id number] suspension 40 mg (40 mg Oral Given 10/07/13 2000)    Patient drinking in no distress when given her test results.   Labs Review Results for orders placed during the hospital encounter of 10/07/13  CBC WITH DIFFERENTIAL      Result Value Ref Range   WBC 8.5  4.0 -  10.5 K/uL   RBC 4.08  3.87 - 5.11 MIL/uL   Hemoglobin 13.9  12.0 - 15.0 g/dL   HCT 40.2  36.0 - 46.0 %   MCV 98.5  78.0 - 100.0 fL   MCH 34.1 (*) 26.0 - 34.0 pg   MCHC 34.6  30.0 - 36.0 g/dL   RDW 13.2  11.5 - 15.5 %   Platelets 188  150 - 400 K/uL   Neutrophils Relative % 62  43 - 77 %   Neutro Abs 5.3  1.7 - 7.7 K/uL   Lymphocytes Relative 31  12 - 46 %   Lymphs Abs 2.7  0.7 - 4.0 K/uL   Monocytes Relative 5  3 - 12 %   Monocytes Absolute 0.5  0.1 - 1.0 K/uL   Eosinophils Relative 1  0 - 5 %   Eosinophils Absolute 0.1  0.0 - 0.7 K/uL   Basophils Relative 0  0 - 1 %   Basophils Absolute 0.0  0.0 - 0.1 K/uL  COMPREHENSIVE METABOLIC PANEL      Result Value Ref Range    Sodium 135 (*) 137 - 147 mEq/L   Potassium 3.9  3.7 - 5.3 mEq/L   Chloride 95 (*) 96 - 112 mEq/L   CO2 27  19 - 32 mEq/L   Glucose, Bld 132 (*) 70 - 99 mg/dL   BUN 37 (*) 6 - 23 mg/dL   Creatinine, Ser 1.44 (*) 0.50 - 1.10 mg/dL   Calcium 9.8  8.4 - 10.5 mg/dL   Total Protein 7.9  6.0 - 8.3 g/dL   Albumin 3.9  3.5 - 5.2 g/dL   AST 31  0 - 37 U/L   ALT 23  0 - 35 U/L   Alkaline Phosphatase 89  39 - 117 U/L   Total Bilirubin 0.3  0.3 - 1.2 mg/dL   GFR calc non Af Amer 34 (*) >90 mL/min   GFR calc Af Amer 40 (*) >90 mL/min  CBG MONITORING, ED      Result Value Ref Range   Glucose-Capillary 127 (*) 70 - 99 mg/dL   Comment 1 Documented in Chart     Laboratory interpretation all normal except stable chronic renal insufficiency    Imaging Review Dg Abd Acute W/chest  10/07/2013   CLINICAL DATA:  Abdominal pain right-sided and periumbilical for several days.  EXAM: ACUTE ABDOMEN SERIES (ABDOMEN 2 VIEW & CHEST 1 VIEW)  COMPARISON:  09/20/2013  FINDINGS: Left-sided pacemaker is unchanged. The lungs are adequately inflated with chronic vascular crowding over the right infrahilar region. Cardiomediastinal silhouette and remainder of the chest is unchanged.  Abdominal images demonstrate several nonspecific air-fluid levels. There is no free peritoneal air. There are no definite dilated small bowel loops. Multiple surgical clips over the abdomen unchanged likely from prior hernia repair. Remainder the exam is unchanged.  IMPRESSION: Several nonspecific air-fluid levels without evidence of obstruction.  No acute cardiopulmonary disease.   Electronically Signed   By: Marin Olp M.D.   On: 10/07/2013 20:49     EKG Interpretation None      MDM   she presents with complaints of active abdominal sounds and gurgling. She has been evaluated by GI. She has had ultrasound, endoscopy and colonoscopy. I'm going to suggest patient try a gluten free diet for possible celiac disease to see if that helps  with her symptoms. The stool samples requested by her gastroenterologist were sent today. Her gastroenterologist office can check the results  this coming week.    Final diagnoses:  Mulat discharge  Rolland Porter, MD, Alanson Aly, MD 10/07/13 2134

## 2013-10-07 NOTE — ED Notes (Signed)
C/o RUQ abdominal pain off and on x1 week with bloating. Pt drank prune juice this am then had diarrhea x 1. Pt brought stool specimen with her.

## 2013-10-07 NOTE — Telephone Encounter (Signed)
Patient called complaining of abdominal "rumbling" / upset stomach. No severe abdominal pain no nausea or vomiting. Hasn't had recent diarrhea. So far, pancreatic enzymes on top of the probiotics have not helped. I note that she had C. difficile back in December 2014. Symptoms somewhat similar to that. She only had intermittent diarrhea with that infection in December.  No fever, chills or bleeding. I recommended she continue her current regimen. I have arranged via the lab here for the patient, and submit another stool sample for C. difficile toxin testing.  Will route this communication to Dr. Oneida Alar.

## 2013-10-07 NOTE — Discharge Instructions (Signed)
Follow up this week with Dr Roseanne Kaufman office to get the results of your stool test. Consider going on a gluten free diet for a while to see if that helps with your abdominal problems.  Return if you get fever, vomiting or seem worse.

## 2013-10-09 ENCOUNTER — Other Ambulatory Visit: Payer: Self-pay | Admitting: Nurse Practitioner

## 2013-10-09 NOTE — Telephone Encounter (Signed)
Agree. I tried to get patient to do C.Diff but she refused.

## 2013-10-09 NOTE — Telephone Encounter (Signed)
REVIEWED. AGREE. 

## 2013-10-10 NOTE — Telephone Encounter (Signed)
Called and informed pt.  

## 2013-10-10 NOTE — Telephone Encounter (Signed)
Pt left a VM that she had not heard from the C-Diff. Please advise!

## 2013-10-10 NOTE — Telephone Encounter (Signed)
Her C.Diff was negative. Stool culture pending.  Looks like she had these test done through ER therefore they do not come to our inbasket.   Keep OV with [facility] as scheduled.

## 2013-10-11 ENCOUNTER — Other Ambulatory Visit: Payer: Self-pay

## 2013-10-11 LAB — STOOL CULTURE

## 2013-10-11 MED ORDER — CLONAZEPAM 0.5 MG PO TABS: 0.5000 mg | ORAL_TABLET | Freq: Three times a day (TID) | ORAL | Status: DC | PRN

## 2013-10-11 NOTE — Telephone Encounter (Signed)
Last seen 09/14/13  MMM  If approved route to nurse to call into [facility]

## 2013-10-11 NOTE — Telephone Encounter (Signed)
Please call in klonopin with 1 refills 

## 2013-10-12 ENCOUNTER — Other Ambulatory Visit: Payer: Self-pay | Admitting: Nurse Practitioner

## 2013-10-12 NOTE — Telephone Encounter (Signed)
Called in.

## 2013-10-13 ENCOUNTER — Other Ambulatory Visit: Payer: Self-pay | Admitting: Nurse Practitioner

## 2013-10-16 ENCOUNTER — Telehealth: Payer: Self-pay | Admitting: Nurse Practitioner

## 2013-10-17 MED ORDER — CLONAZEPAM 0.5 MG PO TABS: 0.5000 mg | ORAL_TABLET | Freq: Three times a day (TID) | ORAL | Status: DC | PRN

## 2013-10-17 NOTE — Telephone Encounter (Signed)
Corrected rx.

## 2013-10-17 NOTE — Telephone Encounter (Signed)
Please correct klonopin rx should have been #90 not #30 thank you

## 2013-10-19 ENCOUNTER — Other Ambulatory Visit: Payer: Self-pay | Admitting: Nurse Practitioner

## 2013-10-22 ENCOUNTER — Other Ambulatory Visit: Payer: Self-pay | Admitting: Nurse Practitioner

## 2013-10-24 DIAGNOSIS — B351 Tinea unguium: Secondary | ICD-10-CM | POA: Diagnosis not present

## 2013-10-24 DIAGNOSIS — E1149 Type 2 diabetes mellitus with other diabetic neurological complication: Secondary | ICD-10-CM | POA: Diagnosis not present

## 2013-10-24 DIAGNOSIS — L851 Acquired keratosis [keratoderma] palmaris et plantaris: Secondary | ICD-10-CM | POA: Diagnosis not present

## 2013-10-26 ENCOUNTER — Ambulatory Visit (INDEPENDENT_AMBULATORY_CARE_PROVIDER_SITE_OTHER): Payer: Medicare Other | Admitting: Pharmacist

## 2013-10-26 DIAGNOSIS — I4891 Unspecified atrial fibrillation: Secondary | ICD-10-CM

## 2013-10-26 LAB — POCT INR: INR: 1.9

## 2013-10-26 MED ORDER — HYDROCODONE-ACETAMINOPHEN 5-325 MG PO TABS: 1.0000 | ORAL_TABLET | Freq: Three times a day (TID) | ORAL | Status: DC | PRN

## 2013-10-26 NOTE — Patient Instructions (Signed)
Anticoagulation Dose Instructions as of 10/26/2013     Dorene Grebe Tue Wed Thu Fri Sat   New Dose 1 mg 1 mg 1 mg 2 mg 1 mg 1 mg 1 mg    Description       Take 1 tablet today (thursday 10/25/13) and then continue same warfarin dose of 1 tablet on wednesdays and 1/2 tablet all other days.       INR was 1.9 today

## 2013-11-01 ENCOUNTER — Ambulatory Visit (INDEPENDENT_AMBULATORY_CARE_PROVIDER_SITE_OTHER): Payer: Medicare Other

## 2013-11-01 ENCOUNTER — Ambulatory Visit (INDEPENDENT_AMBULATORY_CARE_PROVIDER_SITE_OTHER): Payer: Medicare Other | Admitting: Pharmacist

## 2013-11-01 ENCOUNTER — Ambulatory Visit: Payer: Medicare Other

## 2013-11-01 ENCOUNTER — Encounter: Payer: Self-pay | Admitting: Pharmacist

## 2013-11-01 ENCOUNTER — Other Ambulatory Visit: Payer: Medicare Other

## 2013-11-01 VITALS — Ht 62.5 in | Wt 147.0 lb

## 2013-11-01 DIAGNOSIS — M810 Age-related osteoporosis without current pathological fracture: Secondary | ICD-10-CM | POA: Insufficient documentation

## 2013-11-01 DIAGNOSIS — F17200 Nicotine dependence, unspecified, uncomplicated: Secondary | ICD-10-CM

## 2013-11-01 DIAGNOSIS — Z13820 Encounter for screening for osteoporosis: Secondary | ICD-10-CM | POA: Diagnosis not present

## 2013-11-01 DIAGNOSIS — E559 Vitamin D deficiency, unspecified: Secondary | ICD-10-CM | POA: Diagnosis not present

## 2013-11-01 DIAGNOSIS — Z7901 Long term (current) use of anticoagulants: Secondary | ICD-10-CM | POA: Diagnosis not present

## 2013-11-01 DIAGNOSIS — I4891 Unspecified atrial fibrillation: Secondary | ICD-10-CM

## 2013-11-01 LAB — POCT INR: INR: 2.1

## 2013-11-01 NOTE — Progress Notes (Signed)
Patient ID: [name], female   DOB: [birth year], 79 y.o.   MRN: [id number] [facility] Current Height: Height: 5' 2.5" (158.8 cm)      Max Lifetime Height: 5\' 4"  Current Weight: Weight: 147 lb (66.679 kg)       Ethnicity:Caucasian    HPI: Does pt already have a diagnosis of:  Osteopenia?  Yes Osteoporosis?  No  Back Pain?  Yes       Kyphosis?  Yes Prior fracture?  No Med(s) for Osteoporosis/Osteopenia:  none Med(s) previously tried for Osteoporosis/Osteopenia:  none                                                             PMH: Age at menopause:  36's Hysterectomy?  No Oophorectomy?  No HRT? No Steroid Use?  Yes - Current.  Type/duration: inhaled steroids Thyroid med?  No History of cancer?  No History of digestive disorders (ie Crohn's)?  Yes - GERD and takes pancreatic enzymes Current or previous eating disorders?  No Last Vitamin D Result:  56 (12/2011) Last GFR Result:  37 (09/14/2013)   FH/SH: Family history of osteoporosis?  No Parent with history of hip fracture?  No Family history of breast cancer?  No Exercise?  No Smoking?  Yes - trying to quite Alcohol?  No    Calcium Assessment Calcium Intake  # of servings/day  Calcium mg  Milk (8 oz) 0  x  300  = 0  Yogurt (4 oz) 1 x  200 = 200mg   Cheese (1 oz) 0 x  200 = 0  Other Calcium sources   250mg   Ca supplement 0 = 0   Estimated calcium intake per day 450mg     DEXA Results Date of Test T-Score for AP Spine L1-L4 T-Score for Total Left Hip T-Score for Total Right Hip  11/01/2013 -1.4 -1.9 -2.2  11/29/2006 -1.2 -1.1 -1.2            **T-Score for L-2 was -3.4**  Estimated serum creatinin = 35.30 ml/min  FRAX 10 year estimate: (based on T-Score of -2.2) Total FX risk:  17%  (consider medication if >/= 20%) Hip FX risk:  7.2%  (consider medication if >/= 3%)  Assessment: Osteoporosis based on Tscore for L-2  Therpeutic anticoagulation Tobacco Abuse  Recommendations: 1.  Due to  decreased renal function - bisphosphonates contraindicated.  Evista also would not be a good option because on warfarin and evvista associated with increased risk of blood clots.   Looking into Prolia coverage but with serum creatinine close to 30 ml/min will need to monitor more closely for hypocalcemia.  Recommended to check calcium 10 days after prolia administered. 2.  recommend calcium 1200mg  daily through supplementation or diet.  3.  recommend weight bearing exercise - 30 minutes at least 4 days per week.   4.  Counseled and educated about fall risk and prevention. 5.   Anticoagulation Dose Instructions as of 11/01/2013     Dorene Grebe Tue Wed Thu Fri Sat   New Dose 1 mg 1 mg 1 mg 2 mg 1 mg 1 mg 1 mg    Description       Continue same warfarin dose of 1 tablet on wednesdays and 1/2 tablet all other days.  6.  Encouraged smoking cessation - patient is trying to quit.  I gave her information about [place] Quit Line for support  Recheck DEXA:  2 years  Time spent counseling patient:  30 minutes   Cherre Robins, PharmD, CPP

## 2013-11-01 NOTE — Patient Instructions (Addendum)
Anticoagulation Dose Instructions as of 11/01/2013     [name] Tue Wed Thu Fri Sat   New Dose 1 mg 1 mg 1 mg 2 mg 1 mg 1 mg 1 mg    Description       Continue same warfarin dose of 1 tablet on wednesdays and 1/2 tablet all other days.       INR was 2.1 today     Bone Density today showed Ostoepenia (low bone thickness) which can increase the likelihood of broken bones.   We are looking into safe options for [name] to take to decrease the chance of breaking a bone.      Fall Prevention and Home Safety Falls cause injuries and can affect all age groups. It is possible to use preventive measures to significantly decrease the likelihood of falls. There are many simple measures which can make your home safer and prevent falls. OUTDOORS  Repair cracks and edges of walkways and driveways.  Remove high doorway thresholds.  Trim shrubbery on the main path into your home.  Have good outside lighting.  Clear walkways of tools, rocks, debris, and clutter.  Check that handrails are not broken and are securely fastened. Both sides of steps should have handrails.  Have leaves, snow, and ice cleared regularly.  Use sand or salt on walkways during winter months.  In the garage, clean up grease or oil spills. BATHROOM  Install night lights.  Install grab bars by the toilet and in the tub and shower.  Use non-skid mats or decals in the tub or shower.  Place a plastic non-slip stool in the shower to sit on, if needed.  Keep floors dry and clean up all water on the floor immediately.  Remove soap buildup in the tub or shower on a regular basis.  Secure bath mats with non-slip, double-sided rug tape.  Remove throw rugs and tripping hazards from the floors. BEDROOMS  Install night lights.  Make sure a bedside light is easy to reach.  Do not use oversized bedding.  Keep a telephone by your bedside.  Have a firm chair with side arms to use for getting  dressed.  Remove throw rugs and tripping hazards from the floor. KITCHEN  Keep handles on pots and pans turned toward the center of the stove. Use back burners when possible.  Clean up spills quickly and allow time for drying.  Avoid walking on wet floors.  Avoid hot utensils and knives.  Position shelves so they are not too high or low.  Place commonly used objects within easy reach.  If necessary, use a sturdy step stool with a grab bar when reaching.  Keep electrical cables out of the way.  Do not use floor polish or wax that makes floors slippery. If you must use wax, use non-skid floor wax.  Remove throw rugs and tripping hazards from the floor. STAIRWAYS  Never leave objects on stairs.  Place handrails on both sides of stairways and use them. Fix any loose handrails. Make sure handrails on both sides of the stairways are as long as the stairs.  Check carpeting to make sure it is firmly attached along stairs. Make repairs to worn or loose carpet promptly.  Avoid placing throw rugs at the top or bottom of stairways, or properly secure the rug with carpet tape to prevent slippage. Get rid of throw rugs, if possible.  Have an electrician put in a light switch at the top and bottom of the  stairs. OTHER FALL PREVENTION TIPS  Wear low-heel or rubber-soled shoes that are supportive and fit well. Wear closed toe shoes.  When using a stepladder, make sure it is fully opened and both spreaders are firmly locked. Do not climb a closed stepladder.  Add color or contrast paint or tape to grab bars and handrails in your home. Place contrasting color strips on first and last steps.  Learn and use mobility aids as needed. Install an electrical emergency response system.  Turn on lights to avoid dark areas. Replace light bulbs that burn out immediately. Get light switches that glow.  Arrange furniture to create clear pathways. Keep furniture in the same place.  Firmly attach  carpet with non-skid or double-sided tape.  Eliminate uneven floor surfaces.  Select a carpet pattern that does not visually hide the edge of steps.  Be aware of all pets. OTHER HOME SAFETY TIPS  Set the water temperature for 120 F (48.8 C).  Keep emergency numbers on or near the telephone.  Keep smoke detectors on every level of the home and near sleeping areas. Document Released: 07/17/2002 Document Revised: 01/26/2012 Document Reviewed: 10/16/2011 [facility] Patient Information 2014 [place].

## 2013-11-02 ENCOUNTER — Ambulatory Visit: Payer: Medicare Other | Admitting: Gastroenterology

## 2013-11-19 ENCOUNTER — Other Ambulatory Visit: Payer: Self-pay | Admitting: Family Medicine

## 2013-11-23 ENCOUNTER — Ambulatory Visit (INDEPENDENT_AMBULATORY_CARE_PROVIDER_SITE_OTHER): Payer: Medicare Other | Admitting: Pharmacist

## 2013-11-23 DIAGNOSIS — I4891 Unspecified atrial fibrillation: Secondary | ICD-10-CM | POA: Diagnosis not present

## 2013-11-23 LAB — POCT INR: INR: 2.4

## 2013-11-23 MED ORDER — CLONAZEPAM 0.5 MG PO TABS: 0.5000 mg | ORAL_TABLET | Freq: Three times a day (TID) | ORAL | Status: DC | PRN

## 2013-11-23 MED ORDER — HYDROCODONE-ACETAMINOPHEN 5-325 MG PO TABS: 1.0000 | ORAL_TABLET | Freq: Three times a day (TID) | ORAL | Status: DC | PRN

## 2013-11-23 NOTE — Patient Instructions (Signed)
Anticoagulation Dose Instructions as of 11/23/2013     [name] Tue Wed Thu Fri Sat   New Dose 1 mg 1 mg 1 mg 2 mg 1 mg 1 mg 1 mg    Description       Continue same warfarin dose of 1 tablet on wednesdays and 1/2 tablet all other days.       INR was 2.4 today

## 2013-11-24 ENCOUNTER — Ambulatory Visit (INDEPENDENT_AMBULATORY_CARE_PROVIDER_SITE_OTHER): Payer: Medicare Other | Admitting: *Deleted

## 2013-11-24 DIAGNOSIS — I495 Sick sinus syndrome: Secondary | ICD-10-CM | POA: Diagnosis not present

## 2013-11-24 DIAGNOSIS — I4891 Unspecified atrial fibrillation: Secondary | ICD-10-CM | POA: Diagnosis not present

## 2013-11-24 DIAGNOSIS — I509 Heart failure, unspecified: Secondary | ICD-10-CM | POA: Diagnosis not present

## 2013-11-24 LAB — MDC_IDC_ENUM_SESS_TYPE_REMOTE
Battery Remaining Longevity: 121 mo
Brady Statistic RV Percent Paced: 26 %
Date Time Interrogation Session: 20150417154411
Implantable Pulse Generator Model: 1110
Implantable Pulse Generator Serial Number: 7199163
Lead Channel Impedance Value: 910 Ohm
Lead Channel Pacing Threshold Amplitude: 0.5 V
Lead Channel Sensing Intrinsic Amplitude: 12 mV
Lead Channel Setting Pacing Amplitude: 2.5 V
Lead Channel Setting Pacing Pulse Width: 0.5 ms
MDC IDC MSMT BATTERY VOLTAGE: 2.96 V
MDC IDC MSMT LEADCHNL RV PACING THRESHOLD PULSEWIDTH: 0.5 ms
MDC IDC SET LEADCHNL RV SENSING SENSITIVITY: 2 mV

## 2013-11-27 ENCOUNTER — Other Ambulatory Visit: Payer: Self-pay | Admitting: Gastroenterology

## 2013-11-29 NOTE — Telephone Encounter (Signed)
Glyburide covered by ins.

## 2013-11-30 ENCOUNTER — Other Ambulatory Visit: Payer: Self-pay | Admitting: Gastroenterology

## 2013-11-30 ENCOUNTER — Encounter: Payer: Self-pay | Admitting: Gastroenterology

## 2013-11-30 ENCOUNTER — Encounter (HOSPITAL_COMMUNITY): Payer: Self-pay | Admitting: Pharmacy Technician

## 2013-11-30 ENCOUNTER — Ambulatory Visit (INDEPENDENT_AMBULATORY_CARE_PROVIDER_SITE_OTHER): Payer: Medicare Other | Admitting: Gastroenterology

## 2013-11-30 VITALS — BP 110/80 | HR 56 | Temp 98.4°F | Ht 63.0 in | Wt 149.2 lb

## 2013-11-30 DIAGNOSIS — R197 Diarrhea, unspecified: Secondary | ICD-10-CM

## 2013-11-30 DIAGNOSIS — R142 Eructation: Secondary | ICD-10-CM

## 2013-11-30 DIAGNOSIS — R143 Flatulence: Secondary | ICD-10-CM | POA: Diagnosis not present

## 2013-11-30 DIAGNOSIS — R141 Gas pain: Secondary | ICD-10-CM

## 2013-11-30 DIAGNOSIS — K219 Gastro-esophageal reflux disease without esophagitis: Secondary | ICD-10-CM

## 2013-11-30 DIAGNOSIS — R140 Abdominal distension (gaseous): Secondary | ICD-10-CM

## 2013-11-30 NOTE — Assessment & Plan Note (Signed)
MOST LIKELY DUE TO IBS MIXED SX. DIFFERENTIAL DIAGNOSIS INCLUDES LACTOSE INTOLERANCE OR SMALL INTESTINE BACTERIAL OVERGROWTH(SIBO)  SHE SHOULD HAVE AN HYDROGEN BREATH TEST TO EVALUATE FOR THE WRONG BACTERIA MIX IN HER SMALL BOWEL(HBT FOR SIBO). FOLLOW UP IN 4 MOS.

## 2013-11-30 NOTE — Assessment & Plan Note (Signed)
WITH INTERMITTENT CONSTIPATION MOST LIKELY DUE TO IBS-MIXED, BILE SALT INDUCED DIARRHEA, AND PANCREATIC INSUFFICIENCY. SX IMPROVED WITH CREON.  CONTINUE CREON  2 WITH MEALS BID. HBT FOR SIBO LOW FAT DIET OPV IN 4 MOS

## 2013-11-30 NOTE — Progress Notes (Signed)
cc'd to pcp 

## 2013-11-30 NOTE — Patient Instructions (Signed)
CONTINUE CREON AND PRILOSEC.  SCHEDULE YOUR HYDROGEN BREATH TEST.  CONTINUE A LOW FAT/HIGH FIBER DIET. SEE INFO BELOW.  FOLLOW UP IN 4 MOS.    Low-Fat Diet BREADS, CEREALS, PASTA, RICE, DRIED PEAS, AND BEANS These products are high in carbohydrates and most are low in fat. Therefore, they can be increased in the diet as substitutes for fatty foods. They too, however, contain calories and should not be eaten in excess. Cereals can be eaten for snacks as well as for breakfast.   FRUITS AND VEGETABLES It is good to eat fruits and vegetables. Besides being sources of fiber, both are rich in vitamins and some minerals. They help you get the daily allowances of these nutrients. Fruits and vegetables can be used for snacks and desserts.  MEATS Limit lean meat, chicken, [place], and fish to no more than 6 ounces per day. Beef, Pork, and Lamb Use lean cuts of beef, pork, and lamb. Lean cuts include:  Extra-lean ground beef.  Arm roast.  Sirloin tip.  Center-cut ham.  Round steak.  Loin chops.  Rump roast.  Tenderloin.  Trim all fat off the outside of meats before cooking. It is not necessary to severely decrease the intake of red meat, but lean choices should be made. Lean meat is rich in protein and contains a highly absorbable form of iron. Premenopausal women, in particular, should avoid reducing lean red meat because this could increase the risk for low red blood cells (iron-deficiency anemia).  Chicken and [place] These are good sources of protein. The fat of poultry can be reduced by removing the skin and underlying fat layers before cooking. Chicken and [place] can be substituted for lean red meat in the diet. Poultry should not be fried or covered with high-fat sauces. Fish and Shellfish Fish is a good source of protein. Shellfish contain cholesterol, but they usually are low in saturated fatty acids. The preparation of fish is important. Like chicken and [place], they should not be fried  or covered with high-fat sauces. EGGS Egg whites contain no fat or cholesterol. They can be eaten often. Try 1 to 2 egg whites instead of whole eggs in recipes or use egg substitutes that do not contain yolk. MILK AND DAIRY PRODUCTS Use skim or 1% milk instead of 2% or whole milk. Decrease whole milk, natural, and processed cheeses. Use nonfat or low-fat (2%) cottage cheese or low-fat cheeses made from vegetable oils. Choose nonfat or low-fat (1 to 2%) yogurt. Experiment with evaporated skim milk in recipes that call for heavy cream. Substitute low-fat yogurt or low-fat cottage cheese for sour cream in dips and salad dressings. Have at least 2 servings of low-fat dairy products, such as 2 glasses of skim (or 1%) milk each day to help get your daily calcium intake. FATS AND OILS Reduce the total intake of fats, especially saturated fat. Butterfat, lard, and beef fats are high in saturated fat and cholesterol. These should be avoided as much as possible. Vegetable fats do not contain cholesterol, but certain vegetable fats, such as coconut oil, palm oil, and palm kernel oil are very high in saturated fats. These should be limited. These fats are often used in bakery goods, processed foods, popcorn, oils, and nondairy creamers. Vegetable shortenings and some peanut butters contain hydrogenated oils, which are also saturated fats. Read the labels on these foods and check for saturated vegetable oils. Unsaturated vegetable oils and fats do not raise blood cholesterol. However, they should be limited because they  are fats and are high in calories. Total fat should still be limited to 30% of your daily caloric intake. Desirable liquid vegetable oils are corn oil, cottonseed oil, olive oil, canola oil, safflower oil, soybean oil, and sunflower oil. Peanut oil is not as good, but small amounts are acceptable. Buy a heart-healthy tub margarine that has no partially hydrogenated oils in the ingredients. Mayonnaise and  salad dressings often are made from unsaturated fats, but they should also be limited because of their high calorie and fat content. Seeds, nuts, peanut butter, olives, and avocados are high in fat, but the fat is mainly the unsaturated type. These foods should be limited mainly to avoid excess calories and fat. OTHER EATING TIPS Snacks  Most sweets should be limited as snacks. They tend to be rich in calories and fats, and their caloric content outweighs their nutritional value. Some good choices in snacks are graham crackers, melba toast, soda crackers, bagels (no egg), English muffins, fruits, and vegetables. These snacks are preferable to snack crackers, [place] fries, TORTILLA CHIPS, and POTATO chips. Popcorn should be air-popped or cooked in small amounts of liquid vegetable oil. Desserts Eat fruit, low-fat yogurt, and fruit ices instead of pastries, cake, and cookies. Sherbet, angel food cake, gelatin dessert, frozen low-fat yogurt, or other frozen products that do not contain saturated fat (pure fruit juice bars, frozen ice pops) are also acceptable.  COOKING METHODS Choose those methods that use little or no fat. They include: Poaching.  Braising.  Steaming.  Grilling.  Baking.  Stir-frying.  Broiling.  Microwaving.  Foods can be cooked in a nonstick pan without added fat, or use a nonfat cooking spray in regular cookware. Limit fried foods and avoid frying in saturated fat. Add moisture to lean meats by using water, broth, cooking wines, and other nonfat or low-fat sauces along with the cooking methods mentioned above. Soups and stews should be chilled after cooking. The fat that forms on top after a few hours in the refrigerator should be skimmed off. When preparing meals, avoid using excess salt. Salt can contribute to raising blood pressure in some people.  EATING AWAY FROM HOME Order entres, potatoes, and vegetables without sauces or butter. When meat exceeds the size of a deck of  cards (3 to 4 ounces), the rest can be taken home for another meal. Choose vegetable or fruit salads and ask for low-calorie salad dressings to be served on the side. Use dressings sparingly. Limit high-fat toppings, such as bacon, crumbled eggs, cheese, sunflower seeds, and olives. Ask for heart-healthy tub margarine instead of butter.

## 2013-11-30 NOTE — Progress Notes (Signed)
Subjective:    Patient ID: [name], female    DOB: [birth year], 79 y.o.   MRN: [id number]  Redge Gainer, MD  HPI FELL 2 WEEKS AGO AND HAD TO GET DAUGHTER TO GET HER UP. NOW HAS LEFT ARM PAIN. TAKING CREON 4 A DAY. BOWELS MORE REGULAR. DOESN'T DIGEST CORN. EATING A HIGH FIBER DIET. ALWAYS HAS INDIGESTION AFTER SHE EATS: BURNING IN CHEST AND UPPER STOMACH SOMETIMES. SUGAR FREE/GLUTEN FREE VANILLA PUDDING HELPS. BMS: Q1-2 DAYS. 2 DAYS LAST WEEK WATERY DIARRHEA BUT CAN HAVE NL STOOLS.    Past Medical History  Diagnosis Date  . Permanent atrial fibrillation     H/o tachybradycardia syndrome on Coumadin anticoagulation, has pacemaker.  [name] CAD (coronary artery disease) 2011    Catheterization August 2011: mild nonobstructive coronary artery disease complicated by large left rectus sheath hematoma status post evacuation Coumadin restarted without recurrence  . Diabetes mellitus   . Chronic airway obstruction, not elsewhere classified   . Anemia, unspecified   . Chronic kidney disease, stage II (mild)   . Unspecified hypertensive kidney disease with chronic kidney disease stage I through stage IV, or unspecified   . Tachycardia-bradycardia     s/p St. Jude single chamber PPM 10/2010   . GERD (gastroesophageal reflux disease)   . Abnormal CT of the chest     Mediastinal and hilar adenopathy followed by Dr. Koleen Nimrod in [place]  . Congestive heart failure, unspecified     EF now 60%, previous nonischemic cardiomyopathy  . Pacemaker     Single chamber [place]. Jude ACCENT [phone] SN 719 04/[birth year]/1959 10/31/2010  . Valvular heart disease     a. H/o moderate mitral stenosis by cath 2011 but no mention of this on 10/2011 echo. b. 10/2011 echo: mod MR, mild AS, mild TR; EF>65%  . Hypercholesterolemia   . Diverticulitis Dec 2012  . Body mass index (BMI) of 20.0-20.9 in adult FEB 2013 147 LBS  . Recurrent UTI   . Neurogenic sleep apnea     Uses noctural oxygen prescribed by her pulmonologi  .  Rheumatic heart disease   . Gastritis     By EGD  . Carotid artery disease     Doppler, 05/2012, 0-39% bilateral  . Vitamin D deficiency   . Dilated bile duct 10/19/2011    Past Surgical History  Procedure Laterality Date  . Single-chamber pacemaker implantation  3/12    by Dr Caryl Comes  . Evacuation of retroperitoneal and left rectus sheath    . Cholecystectomy      40+ years ago  . Hernia repair      X3  . Pilonidal cyst / sinus excision    . Esophagogastroduodenoscopy  10/2011    Kyren Knick-erosive gastritis, biopsy with no H. pylori, hiatal hernia, peptic duodenitis, no celiac disease, duodenal diverticulum, duodenal nodule  . Colonoscopy  10/2011    Dyann Ruddle sigmoid to descending diverticulosis, internal hemorrhoids  . Eus  11/16/11    Mishra-13 MM CBD, normal pancreatic duct, otherwise normal EUS      Review of Systems     Objective:   Physical Exam  Vitals reviewed. Constitutional: She is oriented to person, place, and time. She appears well-developed and well-nourished. No distress.  HENT:  Head: Normocephalic and atraumatic.  Mouth/Throat: Oropharynx is clear and moist. No oropharyngeal exudate.  Eyes: Pupils are equal, round, and reactive to light. No scleral icterus.  Neck: Normal range of motion. Neck supple.  Cardiovascular: Normal rate and regular rhythm.   Pulmonary/Chest:  Effort normal and breath sounds normal.  Abdominal: Soft. Bowel sounds are normal. She exhibits no distension. There is no tenderness.  ABD WALL PROMINENCE CONSISTENT WITH INCISIONAL HERNIA  Musculoskeletal: She exhibits edema (TRACE BIL LE).  WALKS ASSISTED WITH A CANE.   Lymphadenopathy:    She has no cervical adenopathy.  Neurological: She is alert and oriented to person, place, and time.  NO  NEW FOCAL DEFICITS   Psychiatric: She has a normal mood and affect.          Assessment & Plan:

## 2013-11-30 NOTE — Assessment & Plan Note (Signed)
SX NOT IDEALLY CONTROLLED.  LOW FAT DIET CONTINUE PRILOSEC OPV IN 4 MOS

## 2013-12-01 NOTE — Progress Notes (Signed)
Reminder in epic °

## 2013-12-05 ENCOUNTER — Encounter: Payer: Self-pay | Admitting: *Deleted

## 2013-12-06 ENCOUNTER — Telehealth: Payer: Self-pay | Admitting: Gastroenterology

## 2013-12-06 NOTE — Telephone Encounter (Signed)
PT SHOULD CONTACT us IF SHE WOULD LIKE TO [place].

## 2013-12-06 NOTE — Telephone Encounter (Signed)
Mrs Burks cancelled HBT for the second time this morning please advise

## 2013-12-07 ENCOUNTER — Encounter (HOSPITAL_COMMUNITY): Admission: RE | Payer: Self-pay | Source: Ambulatory Visit

## 2013-12-07 ENCOUNTER — Encounter: Payer: Self-pay | Admitting: Internal Medicine

## 2013-12-07 ENCOUNTER — Ambulatory Visit (HOSPITAL_COMMUNITY): Admission: RE | Admit: 2013-12-07 | Payer: Medicare Other | Source: Ambulatory Visit | Admitting: Gastroenterology

## 2013-12-07 SURGERY — BREATH TEST, FOR INTESTINAL BACTERIAL OVERGROWTH

## 2013-12-13 ENCOUNTER — Ambulatory Visit (INDEPENDENT_AMBULATORY_CARE_PROVIDER_SITE_OTHER): Payer: Medicare Other | Admitting: Nurse Practitioner

## 2013-12-13 ENCOUNTER — Encounter: Payer: Self-pay | Admitting: Nurse Practitioner

## 2013-12-13 ENCOUNTER — Other Ambulatory Visit: Payer: Self-pay | Admitting: Nurse Practitioner

## 2013-12-13 VITALS — BP 107/66 | HR 60 | Temp 97.8°F | Ht 63.0 in | Wt 146.6 lb

## 2013-12-13 DIAGNOSIS — I10 Essential (primary) hypertension: Secondary | ICD-10-CM | POA: Diagnosis not present

## 2013-12-13 DIAGNOSIS — I4891 Unspecified atrial fibrillation: Secondary | ICD-10-CM | POA: Diagnosis not present

## 2013-12-13 DIAGNOSIS — Z950 Presence of cardiac pacemaker: Secondary | ICD-10-CM | POA: Diagnosis not present

## 2013-12-13 DIAGNOSIS — I2510 Atherosclerotic heart disease of native coronary artery without angina pectoris: Secondary | ICD-10-CM

## 2013-12-13 DIAGNOSIS — K219 Gastro-esophageal reflux disease without esophagitis: Secondary | ICD-10-CM

## 2013-12-13 DIAGNOSIS — E119 Type 2 diabetes mellitus without complications: Secondary | ICD-10-CM

## 2013-12-13 DIAGNOSIS — J441 Chronic obstructive pulmonary disease with (acute) exacerbation: Secondary | ICD-10-CM

## 2013-12-13 DIAGNOSIS — I509 Heart failure, unspecified: Secondary | ICD-10-CM

## 2013-12-13 LAB — POCT INR: INR: 1.8

## 2013-12-13 LAB — POCT GLYCOSYLATED HEMOGLOBIN (HGB A1C): Hemoglobin A1C: 5.5

## 2013-12-13 NOTE — Progress Notes (Signed)
Subjective:    Patient ID: [name], female    DOB: [birth year], 79 y.o.   MRN: [id number]  HPI Patient here today for follow up of chronic medical problems- SHe is doing well without compliants today. Patient Active Problem List   Diagnosis Date Noted  . Osteoporosis, post-menopausal 11/01/2013  . Bloating 09/25/2013  . GERD (gastroesophageal reflux disease) 07/13/2013  . Diarrhea 07/13/2013  . Warfarin anticoagulation 06/27/2013  . Mitral regurgitation 06/27/2013  . Aortic stenosis 06/27/2013  . COPD with acute exacerbation 06/27/2013  . Acute on chronic diastolic CHF (congestive heart failure) 06/27/2013  . Vitamin D deficiency   . Peripheral edema 07/21/2012  . Carotid artery disease   . Tobacco abuse 05/12/2012  . Hypoglycemia 05/11/2012  . Dysuria 10/21/2011  . Abdominal wall hernia 10/19/2011  . Constipation 09/29/2011  . Abnormal CT of the abdomen 09/29/2011  . Abdominal pain 09/29/2011  . Weight loss 09/29/2011  . CAD (coronary artery disease)   . Abnormal CT of the chest   . Pacemaker-St.Jude   . Tachycardia-bradycardia syndrome 12/03/2010  . Chronic anticoagulation 12/03/2010  . Anxiety 12/03/2010  . Mitral stenosis 05/30/2010  . ANEMIA 05/16/2010  . Type II or unspecified type diabetes mellitus without mention of complication, not stated as uncontrolled 01/19/2009  . HYPERTENSION 01/19/2009  . Atrial fibrillation 01/19/2009  . CHF 01/19/2009  . COPD 01/19/2009  . HEART MURMUR, BENIGN 01/19/2009   Outpatient Encounter Prescriptions as of 12/13/2013  Medication Sig  . bisacodyl (DULCOLAX) 5 MG EC tablet Take 5 mg by mouth daily as needed for constipation.  . budesonide-formoterol (SYMBICORT) 160-4.5 MCG/ACT inhaler Inhale 2 puffs into the lungs 2 (two) times daily.  . clonazePAM (KLONOPIN) 0.5 MG tablet Take 1 tablet (0.5 mg total) by mouth 3 (three) times daily as needed for anxiety.  [name] CREON 36000 UNITS CPEP TAKE 2 CAPSULES BY MOUTH WITH MEALS AND 1  CAPSULE WITH SNACKS AS INSTRUCTED  . diltiazem (CARDIZEM CD) 120 MG 24 hr capsule Take 120 mg by mouth daily.  . furosemide (LASIX) 40 MG tablet TAKE ONE TABLET BY MOUTH ONE TIME DAILY  . HYDROcodone-acetaminophen (NORCO/VICODIN) 5-325 MG per tablet Take 1 tablet by mouth 3 (three) times daily as needed for moderate pain.  . metoprolol tartrate (LOPRESSOR) 25 MG tablet Take 1 tablet (25 mg total) by mouth 2 (two) times daily.  [name] omeprazole (PRILOSEC) 40 MG capsule Take 40 mg by mouth 2 (two) times daily.  [name] PROAIR HFA 108 (90 BASE) MCG/ACT inhaler USE 2 PUFFS BY MOUTH EVERY 3   TO 4 HOURS WHILE AWAKE AS NEEDED  . Probiotic Product ([place]) CAPS Take 1 capsule by mouth daily.  . simvastatin (ZOCOR) 10 MG tablet TAKE ONE TABLET BY MOUTH ONE TIME DAILY  . sitaGLIPtin (JANUVIA) 50 MG tablet Take 1 tablet (50 mg total) by mouth daily.  [name] tiotropium (SPIRIVA) 18 MCG inhalation capsule Place 18 mcg into inhaler and inhale at bedtime.  [name] warfarin (COUMADIN) 2 MG tablet TAKE 1 TABLET ON MONDAY, WEDNESDAY, AND FRIDAY. TAKE 1/2 TABLET ON ALLOTHER DAYS  . zafirlukast (ACCOLATE) 20 MG tablet Take 20 mg by mouth 2 (two) times daily.       Review of Systems  Constitutional: Negative.   HENT: Negative.   Respiratory: Negative.   Cardiovascular: Negative.   Gastrointestinal: Negative.   Genitourinary: Negative.   Psychiatric/Behavioral: Negative.   All other systems reviewed and are negative.      Objective:   Physical  Exam  Constitutional: She is oriented to person, place, and time. She appears well-developed and well-nourished.  HENT:  Nose: Nose normal.  Mouth/Throat: Oropharynx is clear and moist.  Eyes: EOM are normal.  Neck: Trachea normal, normal range of motion and full passive range of motion without pain. Neck supple. No JVD present. Carotid bruit is not present. No thyromegaly present.  Cardiovascular: Normal rate, regular rhythm, normal heart sounds and intact distal  pulses.  Exam reveals no gallop and no friction rub.   No murmur heard. Pulmonary/Chest: Effort normal and breath sounds normal.  Abdominal: Soft. Bowel sounds are normal. She exhibits no distension and no mass. There is no tenderness.  Musculoskeletal: Normal range of motion.  Lymphadenopathy:    She has no cervical adenopathy.  Neurological: She is alert and oriented to person, place, and time. She has normal reflexes.  Skin: Skin is warm and dry.  Psychiatric: She has a normal mood and affect. Her behavior is normal. Judgment and thought content normal.   BP 107/66  Pulse 60  Temp(Src) 97.8 F (36.6 C) (Oral)  Ht _0  (1.6 m)  Wt 146 lb 9.6 oz (66.497 kg)  BMI 25.98 kg/m2   Results for orders placed in visit on 12/13/13  POCT GLYCOSYLATED HEMOGLOBIN (HGB A1C)      Result Value Ref Range   Hemoglobin A1C 5.5%    POCT INR      Result Value Ref Range   INR 1.8         Assessment & Plan:   1. Type II or unspecified type diabetes mellitus without mention of complication, not stated as uncontrolled   2. HYPERTENSION   3. Pacemaker-St.Jude   4. GERD (gastroesophageal reflux disease)   5. COPD with acute exacerbation   6. CHF   7. Atrial fibrillation   8. CAD (coronary artery disease)    Orders Placed This Encounter  Procedures  . CMP14+EGFR  . NMR, lipoprofile  . POCT glycosylated hemoglobin (Hb A1C)    Labs pending Health maintenance reviewed Diet and exercise encouraged Continue all meds Follow up  In 3 months  [place], FNP

## 2013-12-13 NOTE — Patient Instructions (Signed)
Anticoagulation Dose Instructions as of 12/13/2013     [name] Tue Wed Thu Fri Sat   New Dose 1 mg 1 mg 1 mg 2 mg 1 mg 1 mg 1 mg    Description       Take 2mg  Thursday and then continue current dose      Recheck in 4 weeks

## 2013-12-14 LAB — NMR, LIPOPROFILE
Cholesterol: 123 mg/dL (ref <200)
HDL Cholesterol by NMR: 31 mg/dL — ABNORMAL LOW (ref >=40)
HDL Particle Number: 19.5 umol/L — ABNORMAL LOW (ref >=30.5)
LDL PARTICLE NUMBER: 1185 nmol/L — AB (ref <1000)
LDL SIZE: 20.6 nm (ref >20.5)
LDLC SERPL CALC-MCNC: 78 mg/dL (ref <100)
LP-IR SCORE: 49 — AB (ref <=45)
Small LDL Particle Number: 603 nmol/L — ABNORMAL HIGH (ref <=527)
Triglycerides by NMR: 71 mg/dL (ref <150)

## 2013-12-14 LAB — CMP14+EGFR
ALK PHOS: 85 IU/L (ref 39–117)
ALT: 13 IU/L (ref 0–32)
AST: 22 IU/L (ref 0–40)
Albumin/Globulin Ratio: 1.5 (ref 1.1–2.5)
Albumin: 4.1 g/dL (ref 3.5–4.8)
BILIRUBIN TOTAL: 0.4 mg/dL (ref 0.0–1.2)
BUN / CREAT RATIO: 24 (ref 11–26)
BUN: 39 mg/dL — ABNORMAL HIGH (ref 8–27)
CHLORIDE: 95 mmol/L — AB (ref 97–108)
CO2: 24 mmol/L (ref 18–29)
Calcium: 10 mg/dL (ref 8.7–10.3)
Creatinine, Ser: 1.63 mg/dL — ABNORMAL HIGH (ref 0.57–1.00)
GFR calc non Af Amer: 30 mL/min/{1.73_m2} — ABNORMAL LOW (ref >59)
GFR, EST AFRICAN AMERICAN: 35 mL/min/{1.73_m2} — AB (ref >59)
Globulin, Total: 2.8 g/dL (ref 1.5–4.5)
Glucose: 97 mg/dL (ref 65–99)
POTASSIUM: 4.7 mmol/L (ref 3.5–5.2)
Sodium: 137 mmol/L (ref 134–144)
Total Protein: 6.9 g/dL (ref 6.0–8.5)

## 2013-12-18 ENCOUNTER — Telehealth: Payer: Self-pay | Admitting: Nurse Practitioner

## 2013-12-18 NOTE — Telephone Encounter (Signed)
Aware of lab results  

## 2013-12-19 ENCOUNTER — Other Ambulatory Visit: Payer: Self-pay | Admitting: Nurse Practitioner

## 2013-12-19 ENCOUNTER — Telehealth: Payer: Self-pay | Admitting: Nurse Practitioner

## 2013-12-19 MED ORDER — CLONAZEPAM 0.5 MG PO TABS: 0.5000 mg | ORAL_TABLET | Freq: Three times a day (TID) | ORAL | Status: DC | PRN

## 2013-12-19 MED ORDER — HYDROCODONE-ACETAMINOPHEN 5-325 MG PO TABS: 1.0000 | ORAL_TABLET | Freq: Three times a day (TID) | ORAL | Status: DC | PRN

## 2013-12-19 NOTE — Telephone Encounter (Signed)
rx ready for pickup 

## 2013-12-19 NOTE — Telephone Encounter (Signed)
Patient aware to be picked up

## 2013-12-26 ENCOUNTER — Other Ambulatory Visit: Payer: Self-pay | Admitting: Family Medicine

## 2014-01-04 ENCOUNTER — Other Ambulatory Visit: Payer: Self-pay | Admitting: Pharmacist

## 2014-01-04 MED ORDER — DENOSUMAB 60 MG/ML ~~LOC~~ SOLN: 60.0000 mg | Freq: Once | SUBCUTANEOUS | Status: DC

## 2014-01-04 NOTE — Telephone Encounter (Signed)
Estimated creatinine clearance is 30.6 ml/min.  Will need to monitor calcium closely - check 1 week post Prolia injection.

## 2014-01-09 DIAGNOSIS — E1149 Type 2 diabetes mellitus with other diabetic neurological complication: Secondary | ICD-10-CM | POA: Diagnosis not present

## 2014-01-09 DIAGNOSIS — B351 Tinea unguium: Secondary | ICD-10-CM | POA: Diagnosis not present

## 2014-01-09 DIAGNOSIS — L851 Acquired keratosis [keratoderma] palmaris et plantaris: Secondary | ICD-10-CM | POA: Diagnosis not present

## 2014-01-15 ENCOUNTER — Ambulatory Visit (INDEPENDENT_AMBULATORY_CARE_PROVIDER_SITE_OTHER): Payer: Medicare Other | Admitting: Pharmacist

## 2014-01-15 ENCOUNTER — Other Ambulatory Visit: Payer: Self-pay | Admitting: Nurse Practitioner

## 2014-01-15 DIAGNOSIS — I4891 Unspecified atrial fibrillation: Secondary | ICD-10-CM

## 2014-01-15 DIAGNOSIS — R7989 Other specified abnormal findings of blood chemistry: Secondary | ICD-10-CM

## 2014-01-15 DIAGNOSIS — R799 Abnormal finding of blood chemistry, unspecified: Secondary | ICD-10-CM

## 2014-01-15 LAB — POCT INR: INR: 2.8

## 2014-01-15 NOTE — Progress Notes (Signed)
Suppose to administer Prolia today but since serum creatinine was elevated last month we are going to recheck today.   Plan to administer at next protime appt in 1 month.  Cherre Robins, PharmD, CPP

## 2014-01-15 NOTE — Patient Instructions (Signed)
Anticoagulation Dose Instructions as of 01/15/2014     [name] Tue Wed Thu Fri Sat   New Dose 1 mg 1 mg 1 mg 2 mg 1 mg 1 mg 1 mg    Description       Continue warfarin 2mg  - 1 tablet on wednesdays and 1/2 tablet all other days       INR was 2.8 today

## 2014-01-16 LAB — BMP8+EGFR
BUN/Creatinine Ratio: 22 (ref 11–26)
BUN: 32 mg/dL — AB (ref 8–27)
CALCIUM: 9.8 mg/dL (ref 8.7–10.3)
CHLORIDE: 92 mmol/L — AB (ref 97–108)
CO2: 24 mmol/L (ref 18–29)
Creatinine, Ser: 1.44 mg/dL — ABNORMAL HIGH (ref 0.57–1.00)
GFR calc Af Amer: 41 mL/min/{1.73_m2} — ABNORMAL LOW (ref >59)
GFR calc non Af Amer: 35 mL/min/{1.73_m2} — ABNORMAL LOW (ref >59)
GLUCOSE: 110 mg/dL — AB (ref 65–99)
Potassium: 4.5 mmol/L (ref 3.5–5.2)
Sodium: 134 mmol/L (ref 134–144)

## 2014-01-18 ENCOUNTER — Encounter: Payer: Self-pay | Admitting: Pharmacist

## 2014-01-19 ENCOUNTER — Telehealth: Payer: Self-pay | Admitting: Family Medicine

## 2014-01-22 MED ORDER — HYDROCODONE-ACETAMINOPHEN 5-325 MG PO TABS: 1.0000 | ORAL_TABLET | Freq: Three times a day (TID) | ORAL | Status: DC | PRN

## 2014-01-22 MED ORDER — CLONAZEPAM 0.5 MG PO TABS: 0.5000 mg | ORAL_TABLET | Freq: Three times a day (TID) | ORAL | Status: DC | PRN

## 2014-01-22 NOTE — Telephone Encounter (Signed)
rx ready for pickup 

## 2014-01-22 NOTE — Telephone Encounter (Signed)
Pt notified that rx's up front and ready to be picked up

## 2014-01-23 ENCOUNTER — Ambulatory Visit: Payer: Medicare Other | Admitting: Family

## 2014-01-27 ENCOUNTER — Other Ambulatory Visit: Payer: Self-pay | Admitting: Gastroenterology

## 2014-01-29 DIAGNOSIS — S81009A Unspecified open wound, unspecified knee, initial encounter: Secondary | ICD-10-CM | POA: Diagnosis not present

## 2014-01-29 DIAGNOSIS — S81809A Unspecified open wound, unspecified lower leg, initial encounter: Secondary | ICD-10-CM | POA: Diagnosis not present

## 2014-01-30 ENCOUNTER — Other Ambulatory Visit: Payer: Self-pay | Admitting: Gastroenterology

## 2014-02-06 ENCOUNTER — Other Ambulatory Visit: Payer: Self-pay | Admitting: *Deleted

## 2014-02-06 DIAGNOSIS — E119 Type 2 diabetes mellitus without complications: Secondary | ICD-10-CM

## 2014-02-06 DIAGNOSIS — I509 Heart failure, unspecified: Secondary | ICD-10-CM

## 2014-02-13 ENCOUNTER — Ambulatory Visit (INDEPENDENT_AMBULATORY_CARE_PROVIDER_SITE_OTHER): Payer: Medicare Other | Admitting: Family Medicine

## 2014-02-13 ENCOUNTER — Encounter: Payer: Self-pay | Admitting: Family Medicine

## 2014-02-13 VITALS — BP 114/62 | HR 61 | Temp 98.3°F | Ht 63.0 in | Wt 150.6 lb

## 2014-02-13 DIAGNOSIS — N390 Urinary tract infection, site not specified: Secondary | ICD-10-CM

## 2014-02-13 DIAGNOSIS — I2510 Atherosclerotic heart disease of native coronary artery without angina pectoris: Secondary | ICD-10-CM | POA: Diagnosis not present

## 2014-02-13 DIAGNOSIS — R300 Dysuria: Secondary | ICD-10-CM

## 2014-02-13 LAB — POCT URINALYSIS DIPSTICK
Bilirubin, UA: NEGATIVE
Glucose, UA: NEGATIVE
Ketones, UA: NEGATIVE
Nitrite, UA: NEGATIVE
Protein, UA: NEGATIVE
Spec Grav, UA: 1.01
Urobilinogen, UA: NEGATIVE
pH, UA: 6

## 2014-02-13 LAB — POCT UA - MICROSCOPIC ONLY
Casts, Ur, LPF, POC: NEGATIVE
Crystals, Ur, HPF, POC: NEGATIVE
Mucus, UA: NEGATIVE
Yeast, UA: NEGATIVE

## 2014-02-13 MED ORDER — NITROFURANTOIN MONOHYD MACRO 100 MG PO CAPS: 100.0000 mg | ORAL_CAPSULE | Freq: Two times a day (BID) | ORAL | Status: DC

## 2014-02-13 NOTE — Progress Notes (Signed)
   Subjective:    Patient ID: Karalee Lesinski, female    DOB: 05-09-1935, 79 y.o.   MRN: [id number]  HPI This 79 y.o. female presents for evaluation of urinary sx's.   Review of Systems C/o urinary frequency and dysuria No chest pain, SOB, HA, dizziness, vision change, N/V, diarrhea, constipation, myalgias, arthralgias or rash.     Objective:   Physical Exam Vital signs noted  Well developed well nourished female.  HEENT - Head atraumatic Normocephalic Respiratory - Lungs CTA bilateral Cardiac - RRR S1 and S2 without murmur GI - Abdomen soft Nontender and bowel sounds active x 4        Assessment & Plan:  Dysuria - Plan: POCT urinalysis dipstick, POCT UA - Microscopic Only, nitrofurantoin, macrocrystal-monohydrate, (MACROBID) 100 MG capsule, Urine culture  Urinary tract infection without hematuria, site unspecified - Plan: nitrofurantoin, macrocrystal-monohydrate, (MACROBID) 100 MG capsule, Urine culture  Lysbeth Penner FNP

## 2014-02-14 ENCOUNTER — Telehealth: Payer: Self-pay | Admitting: Gastroenterology

## 2014-02-14 NOTE — Telephone Encounter (Signed)
Pt has a question about her medicines. [place]:[phone]

## 2014-02-14 NOTE — Telephone Encounter (Signed)
I returned pt's call and she had questions about some meds. She saw PCP and is being treated for UTI with Macradantin 100 mg bid. She said that it says not to take anything with magnesium in it while taking the Macradantin. She is still having problems with bloating and indigestion and said her medicine has not been helping. What can she take, and especially while taking the Macradantin? Please advise!

## 2014-02-15 ENCOUNTER — Other Ambulatory Visit: Payer: Self-pay | Admitting: Pharmacist

## 2014-02-15 ENCOUNTER — Ambulatory Visit (INDEPENDENT_AMBULATORY_CARE_PROVIDER_SITE_OTHER): Payer: Medicare Other | Admitting: Pharmacist

## 2014-02-15 DIAGNOSIS — I4891 Unspecified atrial fibrillation: Secondary | ICD-10-CM

## 2014-02-15 LAB — POCT INR: INR: 3.2

## 2014-02-15 LAB — URINE CULTURE

## 2014-02-15 MED ORDER — CLONAZEPAM 0.5 MG PO TABS: 0.5000 mg | ORAL_TABLET | Freq: Three times a day (TID) | ORAL | Status: DC | PRN

## 2014-02-15 MED ORDER — HYDROCODONE-ACETAMINOPHEN 5-325 MG PO TABS: 1.0000 | ORAL_TABLET | Freq: Three times a day (TID) | ORAL | Status: DC | PRN

## 2014-02-15 NOTE — Patient Instructions (Signed)
Start nitrofurantoin.  Recommend separating dose of magnesium and nitrofurantoin by at least 2 hours.   Anticoagulation Dose Instructions as of 02/15/2014     [name] Tue Wed Thu Fri Sat   New Dose 1 mg 1 mg 1 mg 2 mg 1 mg 1 mg 1 mg    Description       No warfarin today (thrusdays, July 9th) then continue warfarin 2mg  - 1 tablet on wednesdays and 1/2 tablet all other days.       INR was 3.2 today.

## 2014-02-15 NOTE — Telephone Encounter (Signed)
Pt called back to see if Dr. Oneida Alar has responded. I told her that she has been really busy at the hospital and I will call her when I hear from Dr. Oneida Alar.

## 2014-02-16 ENCOUNTER — Telehealth: Payer: Self-pay | Admitting: Nurse Practitioner

## 2014-02-16 MED ORDER — CIPROFLOXACIN HCL 500 MG PO TABS
500.0000 mg | ORAL_TABLET | Freq: Two times a day (BID) | ORAL | Status: DC
Start: 2014-02-16 — End: 2014-03-01

## 2014-02-16 NOTE — Telephone Encounter (Signed)
Patient is itching all over!!!!

## 2014-02-16 NOTE — Telephone Encounter (Signed)
Stop macrobid- new rx sent to pharmacy- benadryl OTC for itching and rash

## 2014-02-16 NOTE — Telephone Encounter (Signed)
Patient aware.

## 2014-03-01 ENCOUNTER — Ambulatory Visit (INDEPENDENT_AMBULATORY_CARE_PROVIDER_SITE_OTHER): Payer: Medicare Other | Admitting: Pharmacist

## 2014-03-01 DIAGNOSIS — M810 Age-related osteoporosis without current pathological fracture: Secondary | ICD-10-CM | POA: Diagnosis not present

## 2014-03-01 DIAGNOSIS — Z7901 Long term (current) use of anticoagulants: Secondary | ICD-10-CM | POA: Diagnosis not present

## 2014-03-01 DIAGNOSIS — I4891 Unspecified atrial fibrillation: Secondary | ICD-10-CM | POA: Diagnosis not present

## 2014-03-01 LAB — POCT INR: INR: 2.2

## 2014-03-01 MED ORDER — DENOSUMAB 60 MG/ML ~~LOC~~ SOLN
60.0000 mg | Freq: Once | SUBCUTANEOUS | Status: AC
  Administered 2014-03-01: 60 mg via SUBCUTANEOUS

## 2014-03-01 NOTE — Progress Notes (Signed)
Patient ID: [name], female   DOB: 1934-11-06, 79 y.o.   MRN: [id number] [facility] Current Height: Height: 5' 2.5" (158.8 cm)      Max Lifetime Height: 5\' 4"  Current Weight: Weight: 147 lb (66.679 kg)       Ethnicity:Caucasian    HPI: Patient with osteoporosis - lowest T-Score is -3.4 at L-2.  She has not had any treatment for osteoporosis that she could tolerate.  She will bet first Prolia injection today  Back Pain?  Yes       Kyphosis?  Yes Prior fracture?  No Med(s) for Osteoporosis/Osteopenia:  none Med(s) previously tried for Osteoporosis/Osteopenia:  none                                                             PMH: Age at menopause:  83's Hysterectomy?  No Oophorectomy?  No HRT? No Steroid Use?  Yes - Current.  Type/duration: inhaled steroids Thyroid med?  No History of cancer?  No History of digestive disorders (ie Crohn's)?  Yes - GERD and takes pancreatic enzymes Current or previous eating disorders?  No Last Vitamin D Result:  56 (12/2011) Last GFR Result:  35 (01/15/2014) Last clacium = 9.8 (01/15/2014)   FH/SH: Family history of osteoporosis?  No Parent with history of hip fracture?  No Family history of breast cancer?  No Exercise?  No Smoking?  Yes - trying to quite Alcohol?  No    Calcium Assessment Calcium Intake  # of servings/day  Calcium mg  Milk (8 oz) 0  x  300  = 0  Yogurt (4 oz) 1 x  200 = 200mg   Cheese (1 oz) 0 x  200 = 0  Other Calcium sources   250mg   Ca supplement 0 = 0   Estimated calcium intake per day 450mg     DEXA Results Date of Test T-Score for AP Spine L1-L4 T-Score for Total Left Hip T-Score for Total Right Hip  11/01/2013 -1.4 -1.9 -2.2  11/29/2006 -1.2 -1.1 -1.2            **T-Score for L-2 was -3.4**   FRAX 10 year estimate: (based on T-Score of -2.2) Total FX risk:  17%  (consider medication if >/= 20%) Hip FX risk:  7.2%  (consider medication if >/= 3%)  Assessment: Osteoporosis based on Tscore  for L-2  Therpeutic anticoagulation Tobacco Abuse  Recommendations: 1.  Prolia 60mg  given SQ today in left arm 2.  recommend calcium 1200mg  daily through supplementation or diet.  3.  recommend weight bearing exercise - 30 minutes at least 4 days per week as able (patient walks with cane) 4.  Counseled and educated about fall risk and prevention. 5.   Anticoagulation Dose Instructions as of 11/01/2013     Dorene Grebe Tue Wed Thu Fri Sat   New Dose 1 mg 1 mg 1 mg 2 mg 1 mg 1 mg 1 mg    Description       Continue same warfarin dose of 1 tablet on wednesdays and 1/2 tablet all other days.      6.  Encouraged smoking cessation - patient is trying to quit.   Recheck DEXA:  2 years Recheck INR in 1 month  Time spent counseling patient:  30 minutes  Cherre Robins, PharmD, CPP

## 2014-03-01 NOTE — Patient Instructions (Signed)
Anticoagulation Dose Instructions as of 03/01/2014     Dorene Grebe Tue Wed Thu Fri Sat   New Dose 1 mg 1 mg 1 mg 2 mg 1 mg 1 mg 1 mg    Description       Continue warfarin 2mg  - 1 tablet on wednesdays and 1/2 tablet all other days.       INR was 2.2 today

## 2014-03-10 ENCOUNTER — Other Ambulatory Visit: Payer: Self-pay | Admitting: Nurse Practitioner

## 2014-03-15 ENCOUNTER — Other Ambulatory Visit: Payer: Self-pay | Admitting: Nurse Practitioner

## 2014-03-19 ENCOUNTER — Other Ambulatory Visit: Payer: Self-pay | Admitting: *Deleted

## 2014-03-19 ENCOUNTER — Encounter: Payer: Self-pay | Admitting: Gastroenterology

## 2014-03-19 MED ORDER — FUROSEMIDE 40 MG PO TABS: ORAL_TABLET | ORAL | Status: DC

## 2014-03-19 NOTE — Telephone Encounter (Signed)
Patient NTBS for follow up and lab work  

## 2014-03-20 ENCOUNTER — Telehealth: Payer: Self-pay | Admitting: Nurse Practitioner

## 2014-03-20 DIAGNOSIS — E1149 Type 2 diabetes mellitus with other diabetic neurological complication: Secondary | ICD-10-CM | POA: Diagnosis not present

## 2014-03-20 DIAGNOSIS — B351 Tinea unguium: Secondary | ICD-10-CM | POA: Diagnosis not present

## 2014-03-20 DIAGNOSIS — L851 Acquired keratosis [keratoderma] palmaris et plantaris: Secondary | ICD-10-CM | POA: Diagnosis not present

## 2014-03-20 MED ORDER — CLONAZEPAM 0.5 MG PO TABS: 0.5000 mg | ORAL_TABLET | Freq: Three times a day (TID) | ORAL | Status: DC | PRN

## 2014-03-20 MED ORDER — HYDROCODONE-ACETAMINOPHEN 5-325 MG PO TABS: 1.0000 | ORAL_TABLET | Freq: Three times a day (TID) | ORAL | Status: DC | PRN

## 2014-03-20 NOTE — Telephone Encounter (Signed)
rx ready for pickup 

## 2014-03-21 NOTE — Telephone Encounter (Signed)
Patient aware that rx up front to be picked up 

## 2014-03-23 ENCOUNTER — Encounter: Payer: Self-pay | Admitting: *Deleted

## 2014-03-28 NOTE — Telephone Encounter (Signed)
PLEASE CALL PT. SHE MAY TAKE A PROBIOTIC DAILY. IF SHE HAS ADDITIONAL CONCERNS SHE NEEDS AN OPV TO DISCUSS.

## 2014-03-28 NOTE — Telephone Encounter (Signed)
Called and informed pt. She has appt with Dr. Oneida Alar on 05/02/2014 at 10:30 AM and will keep that appt.

## 2014-03-30 ENCOUNTER — Other Ambulatory Visit: Payer: Self-pay | Admitting: Nurse Practitioner

## 2014-04-10 ENCOUNTER — Other Ambulatory Visit: Payer: Self-pay | Admitting: *Deleted

## 2014-04-10 MED ORDER — DILTIAZEM HCL ER COATED BEADS 120 MG PO CP24: 120.0000 mg | ORAL_CAPSULE | Freq: Every day | ORAL | Status: DC

## 2014-04-10 NOTE — Telephone Encounter (Signed)
Medication refilled - Diltiazem CD

## 2014-04-11 ENCOUNTER — Other Ambulatory Visit: Payer: Self-pay | Admitting: *Deleted

## 2014-04-12 MED ORDER — TIOTROPIUM BROMIDE MONOHYDRATE 18 MCG IN CAPS: ORAL_CAPSULE | RESPIRATORY_TRACT | Status: DC

## 2014-04-18 ENCOUNTER — Ambulatory Visit (INDEPENDENT_AMBULATORY_CARE_PROVIDER_SITE_OTHER): Payer: Medicare Other | Admitting: Cardiovascular Disease

## 2014-04-18 ENCOUNTER — Encounter: Payer: Self-pay | Admitting: Cardiovascular Disease

## 2014-04-18 VITALS — BP 110/68 | HR 60 | Ht 63.0 in | Wt 151.0 lb

## 2014-04-18 DIAGNOSIS — Z950 Presence of cardiac pacemaker: Secondary | ICD-10-CM | POA: Diagnosis not present

## 2014-04-18 DIAGNOSIS — I5032 Chronic diastolic (congestive) heart failure: Secondary | ICD-10-CM | POA: Diagnosis not present

## 2014-04-18 DIAGNOSIS — F17200 Nicotine dependence, unspecified, uncomplicated: Secondary | ICD-10-CM

## 2014-04-18 DIAGNOSIS — I482 Chronic atrial fibrillation, unspecified: Secondary | ICD-10-CM

## 2014-04-18 DIAGNOSIS — I2510 Atherosclerotic heart disease of native coronary artery without angina pectoris: Secondary | ICD-10-CM

## 2014-04-18 DIAGNOSIS — I4891 Unspecified atrial fibrillation: Secondary | ICD-10-CM | POA: Diagnosis not present

## 2014-04-18 DIAGNOSIS — Z7189 Other specified counseling: Secondary | ICD-10-CM

## 2014-04-18 DIAGNOSIS — Z716 Tobacco abuse counseling: Secondary | ICD-10-CM

## 2014-04-18 DIAGNOSIS — Z79899 Other long term (current) drug therapy: Secondary | ICD-10-CM

## 2014-04-18 DIAGNOSIS — R609 Edema, unspecified: Secondary | ICD-10-CM

## 2014-04-18 DIAGNOSIS — I10 Essential (primary) hypertension: Secondary | ICD-10-CM

## 2014-04-18 DIAGNOSIS — I779 Disorder of arteries and arterioles, unspecified: Secondary | ICD-10-CM

## 2014-04-18 DIAGNOSIS — I080 Rheumatic disorders of both mitral and aortic valves: Secondary | ICD-10-CM

## 2014-04-18 DIAGNOSIS — I739 Peripheral vascular disease, unspecified: Secondary | ICD-10-CM

## 2014-04-18 NOTE — Patient Instructions (Signed)
There were no changes to your medications. Continue as directed. Your physician wants you to follow up in:  1 year.  You will receive a reminder letter in the mail one-two months in advance.  If you don't receive a letter, please call our office to schedule the follow up appointment. 

## 2014-04-18 NOTE — Progress Notes (Signed)
Patient ID: [name], female   DOB: 11-Feb-1935, 79 y.o.   MRN: [id number]      SUBJECTIVE: The patient is here for routine cardiovascular followup. She has COPD, chronic diastolic heart failure, permanent atrial fibrillation/flutter and has a pacemaker. Device interrogation on 11/24/13 demonstrated normal device function with one high ventricular rate noted. She has occasional tachycardia and takes an extra metoprolol prn. These episodes are very infrequent.  She also has a h/o mild nonobstructive CAD and a previous tachycardia-induced cardiomyopathy which has since normalized in function. Her most recent echocardiogram in November 2014 showed normal left ventricular systolic function and very mild aortic stenosis, with no evidence of mitral stenosis and moderate biatrial enlargement.   The patient denies any symptoms of chest pain, palpitations, worsening of baseline shortness of breath, lightheadedness, dizziness, leg swelling, orthopnea, PND, and syncope.  She continues to smoke 0.5 ppd per week but realizes she should quit.   She has a dry cough. She denies fevers.    Review of Systems: As per "subjective", otherwise negative.  Allergies  Allergen Reactions  . Torsemide Nausea And Vomiting  . Macrobid [Nitrofurantoin Monohyd Macro] Rash  . Tramadol Nausea Only    Current Outpatient Prescriptions  Medication Sig Dispense Refill  . bisacodyl (DULCOLAX) 5 MG EC tablet Take 5 mg by mouth daily as needed for constipation.      . clonazePAM (KLONOPIN) 0.5 MG tablet Take 1 tablet (0.5 mg total) by mouth 3 (three) times daily as needed for anxiety.  90 tablet  0  . CREON 36000 UNITS CPEP TAKE 2 CAPSULES BY MOUTH WITH MEALS AND 1 CAPSULE WITH SNACKS AS INSTRUCTED  240 capsule  3  . denosumab (PROLIA) 60 MG/ML SOLN injection Inject 60 mg into the skin once. Administer in upper arm, thigh, or abdomen  1 mL  0  . diltiazem (CARDIZEM CD) 120 MG 24 hr capsule Take 1 capsule (120 mg total)  by mouth daily.  30 capsule  6  . furosemide (LASIX) 40 MG tablet TAKE ONE TABLET BY MOUTH ONE TIME DAILY  30 tablet  6  . HYDROcodone-acetaminophen (NORCO/VICODIN) 5-325 MG per tablet Take 1 tablet by mouth 3 (three) times daily as needed for moderate pain.  90 tablet  0  . JANUVIA 50 MG tablet TAKE ONE TABLET BY MOUTH ONE TIME DAILY  30 tablet  3  . metoprolol tartrate (LOPRESSOR) 25 MG tablet Take 1 tablet (25 mg total) by mouth 2 (two) times daily.  60 tablet  11  . omeprazole (PRILOSEC) 40 MG capsule TAKE 1 CAPSULE BY MOUTH TWICE A DAY BEFORE A MEAL  60 capsule  4  . PROAIR HFA 108 (90 BASE) MCG/ACT inhaler USE 2 PUFFS BY MOUTH EVERY 3   TO 4 HOURS WHILE AWAKE AS NEEDED  18 g  4  . Probiotic Product ([name]) CAPS Take 1 capsule by mouth daily.      . simvastatin (ZOCOR) 10 MG tablet TAKE ONE TABLET BY MOUTH ONE TIME DAILY  30 tablet  2  . SYMBICORT 160-4.5 MCG/ACT inhaler INHALE 2 PUFFS INTO THE LUNGS 2 (TWO) TIMES DAILY.  11 g  2  . tiotropium (SPIRIVA HANDIHALER) 18 MCG inhalation capsule INHALE CONTENTS ONE CAPSULE BY MOUTH ONE TIME DAILY  30 capsule  3  . warfarin (COUMADIN) 2 MG tablet Take as directed  45 tablet  0  . zafirlukast (ACCOLATE) 20 MG tablet TAKE ONE TABLET BY MOUTH TWICE DAILY  60 tablet  4   No current facility-administered medications for this visit.    Past Medical History  Diagnosis Date  . Permanent atrial fibrillation     H/o tachybradycardia syndrome on Coumadin anticoagulation, has pacemaker.  [name] CAD (coronary artery disease) 2011    Catheterization August 2011: mild nonobstructive coronary artery disease complicated by large left rectus sheath hematoma status post evacuation Coumadin restarted without recurrence  . Diabetes mellitus   . Chronic airway obstruction, not elsewhere classified   . Anemia, unspecified   . Chronic kidney disease, stage II (mild)   . Unspecified hypertensive kidney disease with chronic kidney disease stage I through  stage IV, or unspecified   . Tachycardia-bradycardia     s/p St. Jude single chamber PPM 10/2010   . GERD (gastroesophageal reflux disease)   . Abnormal CT of the chest     Mediastinal and hilar adenopathy followed by Dr. Koleen [name] in [place]  . Congestive heart failure, unspecified     EF now 60%, previous nonischemic cardiomyopathy  . Pacemaker     Single chamber [place]. Jude ACCENT [phone] SN [id number] 04/11/1959 10/31/2010  . Valvular heart disease     a. H/o moderate mitral stenosis by cath 2011 but no mention of this on 10/2011 echo. b. 10/2011 echo: mod MR, mild AS, mild TR; EF>65%  . Hypercholesterolemia   . Diverticulitis Dec 2012  . Body mass index (BMI) of 20.0-20.9 in adult FEB 2013 147 LBS  . Recurrent UTI   . Neurogenic sleep apnea     Uses noctural oxygen prescribed by her pulmonologi  . Rheumatic heart disease   . Gastritis     By EGD  . Carotid artery disease     Doppler, 05/2012, 0-39% bilateral  . Vitamin D deficiency   . Dilated bile duct 10/19/2011    Past Surgical History  Procedure Laterality Date  . Single-chamber pacemaker implantation  3/12    by Dr [name]  . Evacuation of retroperitoneal and left rectus sheath    . Cholecystectomy      40+ years ago  . Hernia repair      X3  . Pilonidal cyst / sinus excision    . Esophagogastroduodenoscopy  10/2011    Fields-erosive gastritis, biopsy with no H. pylori, hiatal hernia, peptic duodenitis, no celiac disease, duodenal diverticulum, duodenal nodule  . Colonoscopy  10/2011    [name] sigmoid to descending diverticulosis, internal hemorrhoids  . Eus  11/16/11    [name]-13 MM CBD, normal pancreatic duct, otherwise normal EUS    History   Social History  . Marital Status: Single    Spouse Name: N/A    Number of Children: 2  . Years of Education: N/A   Occupational History  . RETIRED    Social History Main Topics  . Smoking status: Current Every Day Smoker -- 0.50 packs/day for 45 years    Types: Cigarettes     Start date: 02/05/1958    Last Attempt to Quit: 08/10/2006  . Smokeless tobacco: Never Used     Comment: smokes 2-3 cigarettes daily  . Alcohol Use: No  . Drug Use: No  . Sexual Activity: Not Currently   Other Topics Concern  . Not on file   Social History Narrative  . No narrative on file     Filed Vitals:   04/18/14 1039  Height: 5\' 3"  (1.6 m)  Weight: 151 lb (68.493 kg)   BP 110/68 Pulse 60   PHYSICAL EXAM General: NAD HEENT: Poor  dentition. Plaque noted on tongue. Neck: No JVD, no thyromegaly. Lungs: Diminished at bases but otherwise clear. CV: Nondisplaced PMI.  Regular rate and rhythm, normal S1/S2, no S3/S4, II/VI ejection systolic murmur heard at RUSB and LUSB. No pretibial or periankle edema.  Chronic stasis dermatitis. Abdomen: Soft, nontender, no hepatosplenomegaly, no distention.  Neurologic: Alert and oriented x 3.  Psych: Normal affect. Skin: Normal. Musculoskeletal: Normal range of motion, no gross deformities. Extremities: No clubbing or cyanosis.   ECG: Most recent ECG reviewed.      ASSESSMENT AND PLAN: 1. Permanent atrial fibrillation s/p pacemaker: Symptomatically stable. Device interrogation on 11/24/2013 demonstrated normal device function with one high ventricular rate noted. Continue warfarin, metoprolol, and diltiazem. No bleeding complications noted.  2. Valvular heart disease: Stable from a symptomatic standpoint with very mild aortic stenosis by echo in 06/2013. Continue monitoring.  3. Carotid artery stenosis: Mild bilaterally in October 2013.  4. Chronic diastolic heart failure: Compensated and symptomatically stable.  5. Tobacco abuse: Cessation counseling provided today.  6. Type II diabetes: On Januvia.  7. COPD: On Proair, Spiriva, and Accolate with h/o tobacco abuse.  Dispo: f/u 6 months.   Kate Sable, M.D., F.A.C.C.

## 2014-04-19 ENCOUNTER — Ambulatory Visit (INDEPENDENT_AMBULATORY_CARE_PROVIDER_SITE_OTHER): Payer: Medicare Other | Admitting: Pharmacist

## 2014-04-19 DIAGNOSIS — I4891 Unspecified atrial fibrillation: Secondary | ICD-10-CM

## 2014-04-19 LAB — POCT INR: INR: 3.2

## 2014-04-19 NOTE — Patient Instructions (Signed)
Anticoagulation Dose Instructions as of 04/19/2014     Dorene Grebe Tue Wed Thu Fri Sat   New Dose 1 mg 1 mg 1 mg 1 mg 1 mg 1 mg 1 mg    Description       No warfarin today - Thursday, September 10th.  Then decrease warfarin dose to take 1/2 tablet every day.       INR was 3.2 today

## 2014-04-23 ENCOUNTER — Telehealth: Payer: Self-pay | Admitting: Nurse Practitioner

## 2014-04-23 MED ORDER — HYDROCODONE-ACETAMINOPHEN 5-325 MG PO TABS: 1.0000 | ORAL_TABLET | Freq: Three times a day (TID) | ORAL | Status: DC | PRN

## 2014-04-23 MED ORDER — CLONAZEPAM 0.5 MG PO TABS: 0.5000 mg | ORAL_TABLET | Freq: Three times a day (TID) | ORAL | Status: DC | PRN

## 2014-04-23 NOTE — Telephone Encounter (Signed)
rx ready for pickup 

## 2014-04-24 NOTE — Telephone Encounter (Signed)
Patient notified

## 2014-04-27 ENCOUNTER — Ambulatory Visit (INDEPENDENT_AMBULATORY_CARE_PROVIDER_SITE_OTHER): Payer: Medicare Other | Admitting: Internal Medicine

## 2014-04-27 ENCOUNTER — Encounter: Payer: Self-pay | Admitting: Internal Medicine

## 2014-04-27 VITALS — BP 104/68 | HR 65 | Ht 63.0 in | Wt 153.0 lb

## 2014-04-27 DIAGNOSIS — Z720 Tobacco use: Secondary | ICD-10-CM

## 2014-04-27 DIAGNOSIS — F17200 Nicotine dependence, unspecified, uncomplicated: Secondary | ICD-10-CM | POA: Diagnosis not present

## 2014-04-27 DIAGNOSIS — I4891 Unspecified atrial fibrillation: Secondary | ICD-10-CM

## 2014-04-27 DIAGNOSIS — I495 Sick sinus syndrome: Secondary | ICD-10-CM

## 2014-04-27 DIAGNOSIS — I2510 Atherosclerotic heart disease of native coronary artery without angina pectoris: Secondary | ICD-10-CM

## 2014-04-27 DIAGNOSIS — I480 Paroxysmal atrial fibrillation: Secondary | ICD-10-CM

## 2014-04-27 LAB — MDC_IDC_ENUM_SESS_TYPE_INCLINIC
Battery Remaining Longevity: 144 mo
Battery Voltage: 2.96 V
Date Time Interrogation Session: 20150918102216
Implantable Pulse Generator Model: 1110
Implantable Pulse Generator Serial Number: 7199163
Lead Channel Pacing Threshold Amplitude: 0.5 V
Lead Channel Pacing Threshold Pulse Width: 0.5 ms
Lead Channel Setting Pacing Amplitude: 2.5 V
Lead Channel Setting Pacing Pulse Width: 0.5 ms
MDC IDC MSMT LEADCHNL RV IMPEDANCE VALUE: 850 Ohm
MDC IDC MSMT LEADCHNL RV SENSING INTR AMPL: 12 mV
MDC IDC SET LEADCHNL RV SENSING SENSITIVITY: 2 mV
MDC IDC STAT BRADY RV PERCENT PACED: 37 %

## 2014-04-27 MED ORDER — DILTIAZEM HCL ER COATED BEADS 180 MG PO CP24: 180.0000 mg | ORAL_CAPSULE | Freq: Every day | ORAL | Status: DC

## 2014-04-27 NOTE — Patient Instructions (Signed)
Your physician recommends that you schedule a follow-up appointment in: 1 year with Dr. Rayann Heman. You will receive a reminder letter in the mail in about 10 months reminding you to call and schedule your appointment. If you don't receive this letter, please contact our office. Merlin/device check 07/30/14. Your physician has recommended you make the following change in your medication:  Stop metoprolol tartrate. Increase your diltiazem to 180 mg daily. Continue all other medications the same.

## 2014-04-27 NOTE — Progress Notes (Signed)
Primary Cardiologist:  Dr Bronson Ing PCP:  Dr Quillian Quince  The patient presents today for routine electrophysiology followup. She appears stable. She is chronically debilitated with multiple comorbidities (see below). She appears stable since her last visit. She has COPD which is most limiting. She continues to smoke. Today, she denies symptoms of palpitations, chest pain, dizziness, presyncope, syncope, or neurologic sequela. The patient feels that she is tolerating medications without difficulties and is otherwise without complaint today.    Past Medical History  Diagnosis Date  . Permanent atrial fibrillation     H/o tachybradycardia syndrome on Coumadin anticoagulation, has pacemaker.  [name] CAD (coronary artery disease) 2011    Catheterization August 2011: mild nonobstructive coronary artery disease complicated by large left rectus sheath hematoma status post evacuation Coumadin restarted without recurrence  . Diabetes mellitus   . Chronic airway obstruction, not elsewhere classified   . Anemia, unspecified   . Chronic kidney disease, stage II (mild)   . Unspecified hypertensive kidney disease with chronic kidney disease stage I through stage IV, or unspecified   . Tachycardia-bradycardia     s/p St. Jude single chamber PPM 10/2010   . GERD (gastroesophageal reflux disease)   . Abnormal CT of the chest     Mediastinal and hilar adenopathy followed by Dr. Koleen Nimrod in [place]  . Congestive heart failure, unspecified     EF now 60%, previous nonischemic cardiomyopathy  . Pacemaker     Single chamber [place]. Jude ACCENT [phone] SN [id number] 04/11/1959 10/31/2010  . Valvular heart disease     a. H/o moderate mitral stenosis by cath 2011 but no mention of this on 10/2011 echo. b. 10/2011 echo: mod MR, mild AS, mild TR; EF>65%  . Hypercholesterolemia   . Diverticulitis Dec 2012  . Body mass index (BMI) of 20.0-20.9 in adult FEB 2013 147 LBS  . Recurrent UTI   . Neurogenic sleep apnea     Uses noctural oxygen  prescribed by her pulmonologi  . Rheumatic heart disease   . Gastritis     By EGD  . Carotid artery disease     Doppler, 05/2012, 0-39% bilateral  . Vitamin D deficiency   . Dilated bile duct 10/19/2011   Past Surgical History  Procedure Laterality Date  . Single-chamber pacemaker implantation  3/12    by Dr Caryl Comes  . Evacuation of retroperitoneal and left rectus sheath    . Cholecystectomy      40+ years ago  . Hernia repair      X3  . Pilonidal cyst / sinus excision    . Esophagogastroduodenoscopy  10/2011    Fields-erosive gastritis, biopsy with no H. pylori, hiatal hernia, peptic duodenitis, no celiac disease, duodenal diverticulum, duodenal nodule  . Colonoscopy  10/2011    Dyann Ruddle sigmoid to descending diverticulosis, internal hemorrhoids  . Eus  11/16/11    Mishra-13 MM CBD, normal pancreatic duct, otherwise normal EUS    Current Outpatient Prescriptions  Medication Sig Dispense Refill  . bisacodyl (DULCOLAX) 5 MG EC tablet Take 5 mg by mouth daily as needed for constipation.      . clonazePAM (KLONOPIN) 0.5 MG tablet Take 1 tablet (0.5 mg total) by mouth 3 (three) times daily as needed for anxiety.  90 tablet  0  . CREON 36000 UNITS CPEP TAKE 2 CAPSULES BY MOUTH WITH MEALS AND 1 CAPSULE WITH SNACKS AS INSTRUCTED  240 capsule  3  . denosumab (PROLIA) 60 MG/ML SOLN injection Inject 60 mg into the skin once.  Administer in upper arm, thigh, or abdomen  1 mL  0  . furosemide (LASIX) 40 MG tablet TAKE ONE TABLET BY MOUTH ONE TIME DAILY  30 tablet  6  . HYDROcodone-acetaminophen (NORCO/VICODIN) 5-325 MG per tablet Take 1 tablet by mouth 3 (three) times daily as needed for moderate pain.  90 tablet  0  . JANUVIA 50 MG tablet TAKE ONE TABLET BY MOUTH ONE TIME DAILY  30 tablet  3  . omeprazole (PRILOSEC) 40 MG capsule TAKE 1 CAPSULE BY MOUTH TWICE A DAY BEFORE A MEAL  60 capsule  4  . PROAIR HFA 108 (90 BASE) MCG/ACT inhaler USE 2 PUFFS BY MOUTH EVERY 3   TO 4 HOURS WHILE AWAKE AS  NEEDED  18 g  4  . Probiotic Product ([place]) CAPS Take 1 capsule by mouth daily.      . simvastatin (ZOCOR) 10 MG tablet TAKE ONE TABLET BY MOUTH ONE TIME DAILY  30 tablet  2  . SYMBICORT 160-4.5 MCG/ACT inhaler INHALE 2 PUFFS INTO THE LUNGS 2 (TWO) TIMES DAILY.  11 g  2  . tiotropium (SPIRIVA HANDIHALER) 18 MCG inhalation capsule INHALE CONTENTS ONE CAPSULE BY MOUTH ONE TIME DAILY  30 capsule  3  . warfarin (COUMADIN) 2 MG tablet Take as directed  45 tablet  0  . zafirlukast (ACCOLATE) 20 MG tablet TAKE ONE TABLET BY MOUTH TWICE DAILY  60 tablet  4   No current facility-administered medications for this visit.    Allergies  Allergen Reactions  . Torsemide Nausea And Vomiting  . Macrobid [Nitrofurantoin Monohyd Macro] Rash  . Tramadol Nausea Only    History   Social History  . Marital Status: Single    Spouse Name: N/A    Number of Children: 2  . Years of Education: N/A   Occupational History  . RETIRED    Social History Main Topics  . Smoking status: Current Every Day Smoker -- 0.50 packs/day for 45 years    Types: Cigarettes    Start date: 02/05/1958    Last Attempt to Quit: 08/10/2006  . Smokeless tobacco: Never Used     Comment: smokes 2-3 cigarettes daily  . Alcohol Use: No  . Drug Use: No  . Sexual Activity: Not Currently   Other Topics Concern  . Not on file   Social History Narrative  . No narrative on file    Family History  Problem Relation Age of Onset  . Cancer Mother     Uterus  . Stroke Mother   . Cancer Daughter     kidney cancer, in remission  . Heart disease Father   . Heart attack Father   . Colon cancer Neg Hx   . Early death Sister 1    died at 44 months old  . Hypertension Brother   . Cancer Paternal Aunt     breast    Physical Exam: Filed Vitals:   04/27/14 1013  BP: 104/68  Pulse: 65  Height: 5\' 3"  (1.6 m)  Weight: 153 lb (69.4 kg)  SpO2: 97%    GEN- The patient is elderly appearing, alert and oriented x 3  today.   Head- normocephalic, atraumatic Eyes-  Sclera clear, conjunctiva pink Ears- hearing intact Oropharynx- clear Neck- supple, no JVP Lymph- no cervical lymphadenopathy Lungs- Clear to ausculation bilaterally, normal work of breathing Chest- pacemaker pocket is well healed Heart- Regular rate and rhythm, 2/6 SEM LSB GI- soft, NT, ND, + BS Extremities-  no clubbing, cyanosis, 1+edema  Pacemaker interrogation- reviewed in detail today,  See PACEART report  Assessment and Plan:  1. Permanent afib  She is on coumadin and tolerating this well  chads2vasc score is at least 4   2. Tachy/brady syndrome Normal pacemaker function  See Pace Art report  No changes today   3. Chronic venous insufficiency  Improved   4. Tachy/brady syndrome  Will increase diltiazem CD to 180mg  daily and stop metoprolol given her wheezing  Will need to watch for further swelling with this change   5. Tobacco  I have strongly encouraged smoking cessation  Unfortunately she is not ready to quit   Merlin  Return in 1year

## 2014-04-29 ENCOUNTER — Other Ambulatory Visit: Payer: Self-pay | Admitting: Family Medicine

## 2014-04-30 NOTE — Telephone Encounter (Signed)
Patient NTBS for follow up and lab work  

## 2014-04-30 NOTE — Telephone Encounter (Signed)
Last [facility] 12/13/13- 5.5%. Last protime 04/19/14.

## 2014-05-02 ENCOUNTER — Ambulatory Visit (INDEPENDENT_AMBULATORY_CARE_PROVIDER_SITE_OTHER): Payer: Medicare Other | Admitting: Gastroenterology

## 2014-05-02 ENCOUNTER — Encounter: Payer: Self-pay | Admitting: Gastroenterology

## 2014-05-02 VITALS — BP 111/68 | HR 73 | Temp 97.1°F | Ht 63.0 in | Wt 154.2 lb

## 2014-05-02 DIAGNOSIS — K219 Gastro-esophageal reflux disease without esophagitis: Secondary | ICD-10-CM

## 2014-05-02 DIAGNOSIS — R197 Diarrhea, unspecified: Secondary | ICD-10-CM | POA: Diagnosis not present

## 2014-05-02 DIAGNOSIS — I2510 Atherosclerotic heart disease of native coronary artery without angina pectoris: Secondary | ICD-10-CM

## 2014-05-02 DIAGNOSIS — R142 Eructation: Secondary | ICD-10-CM

## 2014-05-02 DIAGNOSIS — R143 Flatulence: Secondary | ICD-10-CM

## 2014-05-02 DIAGNOSIS — R141 Gas pain: Secondary | ICD-10-CM | POA: Diagnosis not present

## 2014-05-02 DIAGNOSIS — R634 Abnormal weight loss: Secondary | ICD-10-CM

## 2014-05-02 DIAGNOSIS — R140 Abdominal distension (gaseous): Secondary | ICD-10-CM

## 2014-05-02 MED ORDER — PANCRELIPASE (LIP-PROT-AMYL) 36000-114000 UNITS PO CPEP: ORAL_CAPSULE | ORAL | Status: DC

## 2014-05-02 NOTE — Assessment & Plan Note (Signed)
MOST LIKELY DUE TO PANCREATIC INSUFFICIENCY. SX IMPROVED ON CREON AND PT GAINING WEIGHT.  INCREASE CREON TO 3 WITH MEALS AND ONE WITH SNACKS. LOW FAT DIET FOLLOW UP IN 4 MOS.

## 2014-05-02 NOTE — Assessment & Plan Note (Signed)
SX CONTROLLED BUT HAS DYSPEPSIA.  AVOID FOOD THAT CAUSE GAS/BLOATING. CONTINUE OMEPRAZOLE. LOW FAT DIET FOLLOW UP IN 4 MOS.

## 2014-05-02 NOTE — Progress Notes (Signed)
PATIENT NIC'D  °

## 2014-05-02 NOTE — Patient Instructions (Signed)
INCREASE CREON TO 3 WITH MEALS. TALE ONE WITH SNACKS.  AVOID FATTY FOODS. IF YOU EAT FATTY FOODS YOUARE GOING TO HAVE DIARRHEA. SEE INFO BELOW.  CONTINUE OMEPRAZOLE.  FOLLOW UP IN 4 MOS.    Low-Fat Diet BREADS, CEREALS, PASTA, RICE, DRIED PEAS, AND BEANS These products are high in carbohydrates and most are low in fat. Therefore, they can be increased in the diet as substitutes for fatty foods. They too, however, contain calories and should not be eaten in excess. Cereals can be eaten for snacks as well as for breakfast.   FRUITS AND VEGETABLES It is good to eat fruits and vegetables. Besides being sources of fiber, both are rich in vitamins and some minerals. They help you get the daily allowances of these nutrients. Fruits and vegetables can be used for snacks and desserts.  MEATS Limit lean meat, chicken, [place], and fish to no more than 6 ounces per day. Beef, Pork, and Lamb Use lean cuts of beef, pork, and lamb. Lean cuts include:  Extra-lean ground beef.  Arm roast.  Sirloin tip.  Center-cut ham.  Round steak.  Loin chops.  Rump roast.  Tenderloin.  Trim all fat off the outside of meats before cooking. It is not necessary to severely decrease the intake of red meat, but lean choices should be made. Lean meat is rich in protein and contains a highly absorbable form of iron. Premenopausal women, in particular, should avoid reducing lean red meat because this could increase the risk for low red blood cells (iron-deficiency anemia).  Chicken and [place] These are good sources of protein. The fat of poultry can be reduced by removing the skin and underlying fat layers before cooking. Chicken and [place] can be substituted for lean red meat in the diet. Poultry should not be fried or covered with high-fat sauces. Fish and Shellfish Fish is a good source of protein. Shellfish contain cholesterol, but they usually are low in saturated fatty acids. The preparation of fish is important.  Like chicken and [place], they should not be fried or covered with high-fat sauces. EGGS Egg whites contain no fat or cholesterol. They can be eaten often. Try 1 to 2 egg whites instead of whole eggs in recipes or use egg substitutes that do not contain yolk. MILK AND DAIRY PRODUCTS Use skim or 1% milk instead of 2% or whole milk. Decrease whole milk, natural, and processed cheeses. Use nonfat or low-fat (2%) cottage cheese or low-fat cheeses made from vegetable oils. Choose nonfat or low-fat (1 to 2%) yogurt. Experiment with evaporated skim milk in recipes that call for heavy cream. Substitute low-fat yogurt or low-fat cottage cheese for sour cream in dips and salad dressings. Have at least 2 servings of low-fat dairy products, such as 2 glasses of skim (or 1%) milk each day to help get your daily calcium intake. FATS AND OILS Reduce the total intake of fats, especially saturated fat. Butterfat, lard, and beef fats are high in saturated fat and cholesterol. These should be avoided as much as possible. Vegetable fats do not contain cholesterol, but certain vegetable fats, such as coconut oil, palm oil, and palm kernel oil are very high in saturated fats. These should be limited. These fats are often used in bakery goods, processed foods, popcorn, oils, and nondairy creamers. Vegetable shortenings and some peanut butters contain hydrogenated oils, which are also saturated fats. Read the labels on these foods and check for saturated vegetable oils. Unsaturated vegetable oils and fats do not  raise blood cholesterol. However, they should be limited because they are fats and are high in calories. Total fat should still be limited to 30% of your daily caloric intake. Desirable liquid vegetable oils are corn oil, cottonseed oil, olive oil, canola oil, safflower oil, soybean oil, and sunflower oil. Peanut oil is not as good, but small amounts are acceptable. Buy a heart-healthy tub margarine that has no partially  hydrogenated oils in the ingredients. Mayonnaise and salad dressings often are made from unsaturated fats, but they should also be limited because of their high calorie and fat content. Seeds, nuts, peanut butter, olives, and avocados are high in fat, but the fat is mainly the unsaturated type. These foods should be limited mainly to avoid excess calories and fat. OTHER EATING TIPS Snacks  Most sweets should be limited as snacks. They tend to be rich in calories and fats, and their caloric content outweighs their nutritional value. Some good choices in snacks are graham crackers, melba toast, soda crackers, bagels (no egg), English muffins, fruits, and vegetables. These snacks are preferable to snack crackers, [place] fries, TORTILLA CHIPS, and POTATO chips. Popcorn should be air-popped or cooked in small amounts of liquid vegetable oil. Desserts Eat fruit, low-fat yogurt, and fruit ices instead of pastries, cake, and cookies. Sherbet, angel food cake, gelatin dessert, frozen low-fat yogurt, or other frozen products that do not contain saturated fat (pure fruit juice bars, frozen ice pops) are also acceptable.  COOKING METHODS Choose those methods that use little or no fat. They include: Poaching.  Braising.  Steaming.  Grilling.  Baking.  Stir-frying.  Broiling.  Microwaving.  Foods can be cooked in a nonstick pan without added fat, or use a nonfat cooking spray in regular cookware. Limit fried foods and avoid frying in saturated fat. Add moisture to lean meats by using water, broth, cooking wines, and other nonfat or low-fat sauces along with the cooking methods mentioned above. Soups and stews should be chilled after cooking. The fat that forms on top after a few hours in the refrigerator should be skimmed off. When preparing meals, avoid using excess salt. Salt can contribute to raising blood pressure in some people.  EATING AWAY FROM HOME Order entres, potatoes, and vegetables without  sauces or butter. When meat exceeds the size of a deck of cards (3 to 4 ounces), the rest can be taken home for another meal. Choose vegetable or fruit salads and ask for low-calorie salad dressings to be served on the side. Use dressings sparingly. Limit high-fat toppings, such as bacon, crumbled eggs, cheese, sunflower seeds, and olives. Ask for heart-healthy tub margarine instead of butter.

## 2014-05-02 NOTE — Progress Notes (Signed)
Subjective:    Patient ID: [name], female    DOB: 06-Jun-1935, 79 y.o.   MRN: [id number]  Redge Gainer, MD  HPI TAKING 2 CREON with meals. NONE WHEN SHE SNACKS BUT SHE DOESN'T DO MUCH SNACKING. R SIDE BOTHERS HER 7 MAY HAVE BURNING AROUND NAVEL. BLOATING: NOT BETTER. GAINED 13 LBS SINCE SEP 2014. BURPING AFTER SHE EATS. SMOKES: 2.5-3 PACKS A WEEK. WATERY STOOLS/DAY: 2-3 SML TO MEDIUM. MAY HAVE A LITTLE #3.   PT DENIES FEVER, CHILLS, HEMATOCHEZIA, HEMATEMESIS, nausea, vomiting, melena, diarrhea, CHEST PAIN, SHORTNESS OF BREATH, CHANGE IN BOWEL IN HABITS, constipation, problems swallowing, OR heartburn or indigestion.  Past Medical History  Diagnosis Date  . Permanent atrial fibrillation     H/o tachybradycardia syndrome on Coumadin anticoagulation, has pacemaker.  [name] CAD (coronary artery disease) 2011    Catheterization August 2011: mild nonobstructive coronary artery disease complicated by large left rectus sheath hematoma status post evacuation Coumadin restarted without recurrence  . Diabetes mellitus   . Chronic airway obstruction, not elsewhere classified   . Anemia, unspecified   . Chronic kidney disease, stage II (mild)   . Unspecified hypertensive kidney disease with chronic kidney disease stage I through stage IV, or unspecified   . Tachycardia-bradycardia     s/p St. Jude single chamber PPM 10/2010   . GERD (gastroesophageal reflux disease)   . Abnormal CT of the chest     Mediastinal and hilar adenopathy followed by Dr. Koleen Nimrod in [place]  . Congestive heart failure, unspecified     EF now 60%, previous nonischemic cardiomyopathy  . Pacemaker     Single chamber [place]. Jude ACCENT [phone] SN 719 04/11/1959 10/31/2010  . Valvular heart disease     a. H/o moderate mitral stenosis by cath 2011 but no mention of this on 10/2011 echo. b. 10/2011 echo: mod MR, mild AS, mild TR; EF>65%  . Hypercholesterolemia   . Diverticulitis Dec 2012  . Body mass index (BMI) of 20.0-20.9  in adult FEB 2013 147 LBS  . Recurrent UTI   . Neurogenic sleep apnea     Uses noctural oxygen prescribed by her pulmonologi  . Rheumatic heart disease   . Gastritis     By EGD  . Carotid artery disease     Doppler, 05/2012, 0-39% bilateral  . Vitamin D deficiency   . Dilated bile duct 10/19/2011   Past Surgical History  Procedure Laterality Date  . Single-chamber pacemaker implantation  3/12    by Dr Caryl Comes  . Evacuation of retroperitoneal and left rectus sheath    . Cholecystectomy      40+ years ago  . Hernia repair      X3  . Pilonidal cyst / sinus excision    . Esophagogastroduodenoscopy  10/2011    Euriah Matlack-erosive gastritis, biopsy with no H. pylori, hiatal hernia, peptic duodenitis, no celiac disease, duodenal diverticulum, duodenal nodule  . Colonoscopy  10/2011    Dyann Ruddle sigmoid to descending diverticulosis, internal hemorrhoids  . Eus  11/16/11    Mishra-13 MM CBD, normal pancreatic duct, otherwise normal EUS    Allergies  Allergen Reactions  . Torsemide Nausea And Vomiting  . Macrobid [Nitrofurantoin Monohyd Macro] Rash  . Tramadol Nausea Only   Current Outpatient Prescriptions  Medication Sig Dispense Refill  . bisacodyl (DULCOLAX) 5 MG EC tablet Take 5 mg by mouth daily as needed for constipation.    . clonazePAM (KLONOPIN) 0.5 MG tablet Take 1 tablet (0.5 mg total) by mouth 3 (  three) times daily as needed for anxiety.    [name] CREON 36000 UNITS CPEP TAKE 2 CAPSULES BY MOUTH WITH MEALS AND 1 CAPSULE WITH SNACKS AS INSTRUCTED    . denosumab (PROLIA) 60 MG/ML SOLN injection Inject 60 mg into the skin once. Administer in upper arm, thigh, or abdomen    . diltiazem (CARDIZEM CD) 180 MG 24 hr capsule Take 1 capsule (180 mg total) by mouth daily.    . furosemide (LASIX) 40 MG tablet TAKE ONE TABLET BY MOUTH ONE TIME DAILY    . HYDROcodone-acetaminophen (NORCO/VICODIN) 5-325 MG per tablet Take 1 tablet by mouth 3 (three) times daily as needed for moderate pain. 2 A DAY     . JANUVIA 50 MG tablet TAKE ONE TABLET BY MOUTH ONE TIME DAILY    . omeprazole (PRILOSEC) 40 MG capsule TAKE 1 CAPSULE BY MOUTH TWICE A DAY BEFORE A MEAL    . PROAIR HFA 108 (90 BASE) MCG/ACT inhaler USE 2 PUFFS BY MOUTH EVERY 3   TO 4 HOURS WHILE AWAKE AS NEEDED    . Probiotic Product ([place]) CAPS Take 1 capsule by mouth daily.    . simvastatin (ZOCOR) 10 MG tablet TAKE ONE TABLET BY MOUTH ONE TIME DAILY    . SYMBICORT 160-4.5 MCG/ACT inhaler INHALE 2 PUFFS INTO THE LUNGS 2 (TWO) TIMES DAILY.    [name] tiotropium (SPIRIVA HANDIHALER) 18 MCG inhalation capsule INHALE CONTENTS ONE CAPSULE BY MOUTH ONE TIME DAILY    . warfarin (COUMADIN) 2 MG tablet TAKE PER PHYSICIAN INSTRUCTIONS    . zafirlukast (ACCOLATE) 20 MG tablet TAKE ONE TABLET BY MOUTH TWICE DAILY       Review of Systems     Objective:   Physical Exam        Assessment & Plan:

## 2014-05-02 NOTE — Assessment & Plan Note (Signed)
SX IMPROVED.  CONTINUE TO MONITOR SYMPTOMS. 

## 2014-05-02 NOTE — Assessment & Plan Note (Signed)
LIKELY DUE TO DIETARY CHOICES. PT HAS ALSO GAINED 14 LBS IN THE PAST YEAR.  CONTINUE TO MONITOR SYMPTOMS.

## 2014-05-09 NOTE — Progress Notes (Signed)
cc'ed to pcp °

## 2014-05-21 ENCOUNTER — Ambulatory Visit (INDEPENDENT_AMBULATORY_CARE_PROVIDER_SITE_OTHER): Payer: Medicare Other | Admitting: Nurse Practitioner

## 2014-05-21 VITALS — BP 120/59 | HR 74 | Temp 99.1°F | Ht 63.0 in | Wt 155.2 lb

## 2014-05-21 DIAGNOSIS — J209 Acute bronchitis, unspecified: Secondary | ICD-10-CM

## 2014-05-21 DIAGNOSIS — I10 Essential (primary) hypertension: Secondary | ICD-10-CM | POA: Diagnosis not present

## 2014-05-21 DIAGNOSIS — I495 Sick sinus syndrome: Secondary | ICD-10-CM

## 2014-05-21 DIAGNOSIS — I050 Rheumatic mitral stenosis: Secondary | ICD-10-CM

## 2014-05-21 DIAGNOSIS — I2510 Atherosclerotic heart disease of native coronary artery without angina pectoris: Secondary | ICD-10-CM

## 2014-05-21 DIAGNOSIS — I4891 Unspecified atrial fibrillation: Secondary | ICD-10-CM | POA: Diagnosis not present

## 2014-05-21 DIAGNOSIS — Z720 Tobacco use: Secondary | ICD-10-CM

## 2014-05-21 DIAGNOSIS — F419 Anxiety disorder, unspecified: Secondary | ICD-10-CM

## 2014-05-21 DIAGNOSIS — E559 Vitamin D deficiency, unspecified: Secondary | ICD-10-CM

## 2014-05-21 DIAGNOSIS — E1310 Other specified diabetes mellitus with ketoacidosis without coma: Secondary | ICD-10-CM | POA: Diagnosis not present

## 2014-05-21 DIAGNOSIS — M810 Age-related osteoporosis without current pathological fracture: Secondary | ICD-10-CM

## 2014-05-21 DIAGNOSIS — I5033 Acute on chronic diastolic (congestive) heart failure: Secondary | ICD-10-CM

## 2014-05-21 DIAGNOSIS — K219 Gastro-esophageal reflux disease without esophagitis: Secondary | ICD-10-CM | POA: Diagnosis not present

## 2014-05-21 DIAGNOSIS — Z950 Presence of cardiac pacemaker: Secondary | ICD-10-CM

## 2014-05-21 DIAGNOSIS — E1110 Type 2 diabetes mellitus with ketoacidosis without coma: Secondary | ICD-10-CM

## 2014-05-21 LAB — POCT INR: INR: 2.2

## 2014-05-21 LAB — POCT GLYCOSYLATED HEMOGLOBIN (HGB A1C): Hemoglobin A1C: 5.6

## 2014-05-21 MED ORDER — HYDROCODONE-ACETAMINOPHEN 5-325 MG PO TABS: 1.0000 | ORAL_TABLET | Freq: Three times a day (TID) | ORAL | Status: DC | PRN

## 2014-05-21 MED ORDER — AMOXICILLIN 875 MG PO TABS: 875.0000 mg | ORAL_TABLET | Freq: Two times a day (BID) | ORAL | Status: DC

## 2014-05-21 MED ORDER — CLONAZEPAM 0.5 MG PO TABS: 0.5000 mg | ORAL_TABLET | Freq: Three times a day (TID) | ORAL | Status: DC | PRN

## 2014-05-21 NOTE — Patient Instructions (Signed)
Anticoagulation Dose Instructions as of 05/21/2014     Dorene Grebe Tue Wed Thu Fri Sat   New Dose 1 mg 1 mg 1 mg 1 mg 1 mg 1 mg 1 mg    Description       No warfarin today - Thursday, September 10th.  Then decrease warfarin dose to take 1/2 tablet every day.      Recheck on Nov. 15,2015

## 2014-05-21 NOTE — Progress Notes (Addendum)
Subjective:    Patient ID: [name], female    DOB: 08-May-1935, 79 y.o.   MRN: [id number]  HPI Patient in today for follow up of chronic medical problems. SHe has not been seen in awhile . She has actually been doing quite well and has not been in the hospital or going to the ER as much as she has in the past. She says that she feels quite well. She has a slight cough but other than that no complaints.  Patient Active Problem List   Diagnosis Date Noted  . Osteoporosis, post-menopausal 11/01/2013  . Bloating 09/25/2013  . GERD (gastroesophageal reflux disease) 07/13/2013  . Diarrhea 07/13/2013  . Aortic stenosis 06/27/2013  . Acute on chronic diastolic CHF (congestive heart failure) 06/27/2013  . Vitamin D deficiency   . Tobacco abuse 05/12/2012  . Dysuria 10/21/2011  . Abdominal wall hernia 10/19/2011  . Weight loss 09/29/2011  . CAD (coronary artery disease)   . Abnormal CT of the chest   . Pacemaker-St.Jude   . Tachycardia-bradycardia syndrome 12/03/2010  . Chronic anticoagulation 12/03/2010  . Anxiety 12/03/2010  . Mitral stenosis 05/30/2010  . ANEMIA 05/16/2010  . DM (diabetes mellitus) type 2, uncontrolled, with ketoacidosis 01/19/2009  . Essential hypertension 01/19/2009  . Atrial fibrillation 01/19/2009  . COPD 01/19/2009  . HEART MURMUR, BENIGN 01/19/2009   Outpatient Encounter Prescriptions as of 05/21/2014  Medication Sig  . bisacodyl (DULCOLAX) 5 MG EC tablet Take 5 mg by mouth daily as needed for constipation.  . clonazePAM (KLONOPIN) 0.5 MG tablet Take 1 tablet (0.5 mg total) by mouth 3 (three) times daily as needed for anxiety.  [name] denosumab (PROLIA) 60 MG/ML SOLN injection Inject 60 mg into the skin once. Administer in upper arm, thigh, or abdomen  . diltiazem (CARDIZEM CD) 180 MG 24 hr capsule Take 1 capsule (180 mg total) by mouth daily.  . furosemide (LASIX) 40 MG tablet TAKE ONE TABLET BY MOUTH ONE TIME DAILY  . HYDROcodone-acetaminophen  (NORCO/VICODIN) 5-325 MG per tablet Take 1 tablet by mouth 3 (three) times daily as needed for moderate pain.  [name] JANUVIA 50 MG tablet TAKE ONE TABLET BY MOUTH ONE TIME DAILY  . lipase/protease/amylase (CREON) 36000 UNITS CPEP capsule 3 PO WITH MEALS AND 1 WITH SNACK  . omeprazole (PRILOSEC) 40 MG capsule TAKE 1 CAPSULE BY MOUTH TWICE A DAY BEFORE A MEAL  . PROAIR HFA 108 (90 BASE) MCG/ACT inhaler USE 2 PUFFS BY MOUTH EVERY 3   TO 4 HOURS WHILE AWAKE AS NEEDED  . Probiotic Product ([place]) CAPS Take 1 capsule by mouth daily.  . simvastatin (ZOCOR) 10 MG tablet TAKE ONE TABLET BY MOUTH ONE TIME DAILY  . SYMBICORT 160-4.5 MCG/ACT inhaler INHALE 2 PUFFS INTO THE LUNGS 2 (TWO) TIMES DAILY.  [name] tiotropium (SPIRIVA HANDIHALER) 18 MCG inhalation capsule INHALE CONTENTS ONE CAPSULE BY MOUTH ONE TIME DAILY  . warfarin (COUMADIN) 2 MG tablet TAKE PER PHYSICIAN INSTRUCTIONS  . zafirlukast (ACCOLATE) 20 MG tablet TAKE ONE TABLET BY MOUTH TWICE DAILY   * patient has seen cardiologist 2x this year and saw GI doctor several weeks ago.   Review of Systems     Objective:   Physical Exam  Constitutional: She is oriented to person, place, and time. She appears well-developed and well-nourished.  HENT:  Nose: Nose normal.  Mouth/Throat: Oropharynx is clear and moist.  Eyes: EOM are normal.  Neck: Trachea normal, normal range of motion and full passive range of  motion without pain. Neck supple. No JVD present. Carotid bruit is not present. No thyromegaly present.  Cardiovascular: Normal rate, regular rhythm, normal heart sounds and intact distal pulses.  Exam reveals no gallop and no friction rub.   No murmur heard. Pulmonary/Chest: Effort normal. Wheezes: exp wheezes.  Deep wet cough   Abdominal: Soft. Bowel sounds are normal. She exhibits no distension and no mass. There is no tenderness.  Musculoskeletal: Normal range of motion.  Lymphadenopathy:    She has no cervical adenopathy.    Neurological: She is alert and oriented to person, place, and time. She has normal reflexes.  Skin: Skin is warm and dry.  Psychiatric: She has a normal mood and affect. Her behavior is normal. Judgment and thought content normal.   BP 120/59  Pulse 74  Temp(Src) 99.1 F (37.3 C) (Oral)  Ht 5' 3"  (1.6 m)  Wt 155 lb 3.2 oz (70.398 kg)  BMI 27.50 kg/m2   Results for orders placed in visit on 05/21/14  POCT GLYCOSYLATED HEMOGLOBIN (HGB A1C)      Result Value Ref Range   Hemoglobin A1C 5.6%    POCT INR      Result Value Ref Range   INR 2.2          Assessment & Plan:   1. Gastroesophageal reflux disease without esophagitis   2. Essential hypertension   3. DM (diabetes mellitus) type 2, uncontrolled, with ketoacidosis   4. Atrial fibrillation   5. Vitamin D deficiency   6. Tobacco abuse   7. Tachycardia-bradycardia syndrome   8. Mitral stenosis   9. Osteoporosis, post-menopausal   10. Pacemaker-St.Jude   11. Acute on chronic diastolic CHF (congestive heart failure)   12. Acute bronchitis, unspecified organism   13. Anxiety    Orders Placed This Encounter  Procedures  . CMP14+EGFR  . NMR, lipoprofile  . POCT glycosylated hemoglobin (Hb A1C)   Meds ordered this encounter  Medications  . amoxicillin (AMOXIL) 875 MG tablet    Sig: Take 1 tablet (875 mg total) by mouth 2 (two) times daily.    Dispense:  20 tablet    Refill:  0    Order Specific Question:  Supervising Provider    Answer:  Chipper Herb [[id number]]  . HYDROcodone-acetaminophen (NORCO/VICODIN) 5-325 MG per tablet    Sig: Take 1 tablet by mouth 3 (three) times daily as needed for moderate pain.    Dispense:  90 tablet    Refill:  0    DO NOT FILL TILL 05/23/14    Order Specific Question:  Supervising Provider    Answer:  Chipper Herb [[id number]]  . clonazePAM (KLONOPIN) 0.5 MG tablet    Sig: Take 1 tablet (0.5 mg total) by mouth 3 (three) times daily as needed for anxiety.    Dispense:  90 tablet     Refill:  0    Do not fill till 05/23/14    Order Specific Question:  Supervising Provider    Answer:  Chipper Herb [[id number]]    1. Take meds as prescribed 2. Use a cool mist humidifier especially during the winter months and when heat has been humid. 3. Use saline nose sprays frequently 4. Saline irrigations of the nose can be very helpful if done frequently.  * 4X daily for 1 week*  * Use of a nettie pot can be helpful with this. Follow directions with this* 5. Drink plenty of fluids 6. Keep thermostat turn down low 7.For  any cough or congestion  Use plain Mucinex- regular strength or max strength is fine   * Children- consult with Pharmacist for dosing 8. For fever or aces or pains- take tylenol or ibuprofen appropriate for age and weight.  * for fevers greater than 101 orally you may alternate ibuprofen and tylenol every  3 hours.    Health maintenace reviewed Discussed INR results  Continue all meds Follow up in 3 months  [place], FNP

## 2014-05-22 LAB — NMR, LIPOPROFILE
CHOLESTEROL: 114 mg/dL (ref 100–199)
HDL CHOLESTEROL BY NMR: 28 mg/dL — AB (ref >39)
HDL Particle Number: 17.8 umol/L — ABNORMAL LOW (ref >=30.5)
LDL PARTICLE NUMBER: 960 nmol/L (ref <1000)
LDL Size: 20.2 nm (ref >20.5)
LDLC SERPL CALC-MCNC: 66 mg/dL (ref 0–99)
LP-IR SCORE: 54 — AB (ref <=45)
Small LDL Particle Number: 632 nmol/L — ABNORMAL HIGH (ref <=527)
TRIGLYCERIDES BY NMR: 101 mg/dL (ref 0–149)

## 2014-05-22 LAB — CMP14+EGFR
ALT: 9 IU/L (ref 0–32)
AST: 19 IU/L (ref 0–40)
Albumin/Globulin Ratio: 1.2 (ref 1.1–2.5)
Albumin: 3.7 g/dL (ref 3.5–4.8)
Alkaline Phosphatase: 70 IU/L (ref 39–117)
BUN/Creatinine Ratio: 20 (ref 11–26)
BUN: 26 mg/dL (ref 8–27)
CALCIUM: 8.9 mg/dL (ref 8.7–10.3)
CHLORIDE: 103 mmol/L (ref 97–108)
CO2: 23 mmol/L (ref 18–29)
Creatinine, Ser: 1.32 mg/dL — ABNORMAL HIGH (ref 0.57–1.00)
GFR calc Af Amer: 45 mL/min/{1.73_m2} — ABNORMAL LOW (ref >59)
GFR calc non Af Amer: 39 mL/min/{1.73_m2} — ABNORMAL LOW (ref >59)
GLOBULIN, TOTAL: 3.1 g/dL (ref 1.5–4.5)
GLUCOSE: 97 mg/dL (ref 65–99)
Potassium: 4.8 mmol/L (ref 3.5–5.2)
Sodium: 141 mmol/L (ref 134–144)
TOTAL PROTEIN: 6.8 g/dL (ref 6.0–8.5)
Total Bilirubin: 0.4 mg/dL (ref 0.0–1.2)

## 2014-05-28 ENCOUNTER — Other Ambulatory Visit: Payer: Self-pay | Admitting: Nurse Practitioner

## 2014-05-29 DIAGNOSIS — L84 Corns and callosities: Secondary | ICD-10-CM | POA: Diagnosis not present

## 2014-05-29 DIAGNOSIS — E1152 Type 2 diabetes mellitus with diabetic peripheral angiopathy with gangrene: Secondary | ICD-10-CM | POA: Diagnosis not present

## 2014-05-29 DIAGNOSIS — B351 Tinea unguium: Secondary | ICD-10-CM | POA: Diagnosis not present

## 2014-05-30 ENCOUNTER — Telehealth: Payer: Self-pay | Admitting: Nurse Practitioner

## 2014-05-30 NOTE — Telephone Encounter (Signed)
Aware. 

## 2014-06-04 DIAGNOSIS — Z79899 Other long term (current) drug therapy: Secondary | ICD-10-CM | POA: Diagnosis not present

## 2014-06-04 DIAGNOSIS — S8002XA Contusion of left knee, initial encounter: Secondary | ICD-10-CM | POA: Diagnosis not present

## 2014-06-04 DIAGNOSIS — F17200 Nicotine dependence, unspecified, uncomplicated: Secondary | ICD-10-CM | POA: Diagnosis not present

## 2014-06-04 DIAGNOSIS — J449 Chronic obstructive pulmonary disease, unspecified: Secondary | ICD-10-CM | POA: Diagnosis not present

## 2014-06-04 DIAGNOSIS — Z950 Presence of cardiac pacemaker: Secondary | ICD-10-CM | POA: Diagnosis not present

## 2014-06-04 DIAGNOSIS — Z7951 Long term (current) use of inhaled steroids: Secondary | ICD-10-CM | POA: Diagnosis not present

## 2014-06-04 DIAGNOSIS — Z7901 Long term (current) use of anticoagulants: Secondary | ICD-10-CM | POA: Diagnosis not present

## 2014-06-04 DIAGNOSIS — M25562 Pain in left knee: Secondary | ICD-10-CM | POA: Diagnosis not present

## 2014-06-04 DIAGNOSIS — E119 Type 2 diabetes mellitus without complications: Secondary | ICD-10-CM | POA: Diagnosis not present

## 2014-06-05 DIAGNOSIS — M25562 Pain in left knee: Secondary | ICD-10-CM | POA: Diagnosis not present

## 2014-06-06 ENCOUNTER — Telehealth: Payer: Self-pay | Admitting: Nurse Practitioner

## 2014-06-06 NOTE — Telephone Encounter (Signed)
Patient fell Monday and went to ER. Wants to know if she can take more of her pain meds because she is hurting. Please advise. Also wants a Geologist, engineering but according to [employer] will not cover. Please let patient know this when call is returned

## 2014-06-07 NOTE — Telephone Encounter (Signed)
NO she cannot increase pain meds- that might have had something to do with her fall to start with.

## 2014-06-07 NOTE — Telephone Encounter (Signed)
Pt notified of MMM recommendation Verbalizes understanding

## 2014-06-11 IMAGING — RF DG ESOPHAGUS
14 of 24 series · 14 of 24 positions shown · non-contrast
Comparison: No priors.

FLUOROSCOPY TIME:  3 min and 54 seconds

CLINICAL DATA: Dysphagia.

EXAM:
ESOPHOGRAM / BARIUM SWALLOW / BARIUM TABLET STUDY
TECHNIQUE: Combined double contrast and single contrast examination performed
using effervescent crystals, thick barium liquid, and thin barium
liquid. The patient was observed with fluoroscopy swallowing a 13mm
barium sulphate tablet.

[Series 1: run · 1 of 1 slices shown (1 of 14)]
[im 1/1]
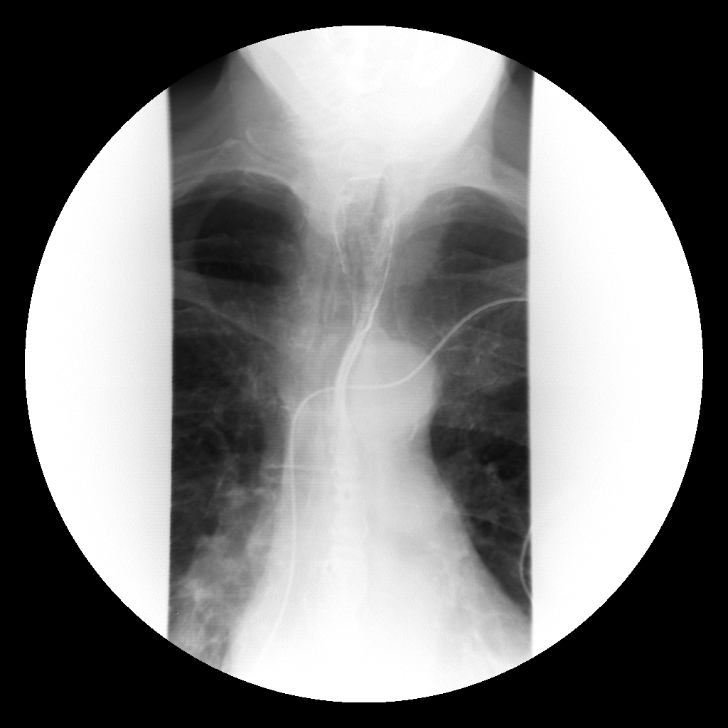

[Series 3: run · 1 of 1 slices shown (2 of 14)]
[im 1/1]
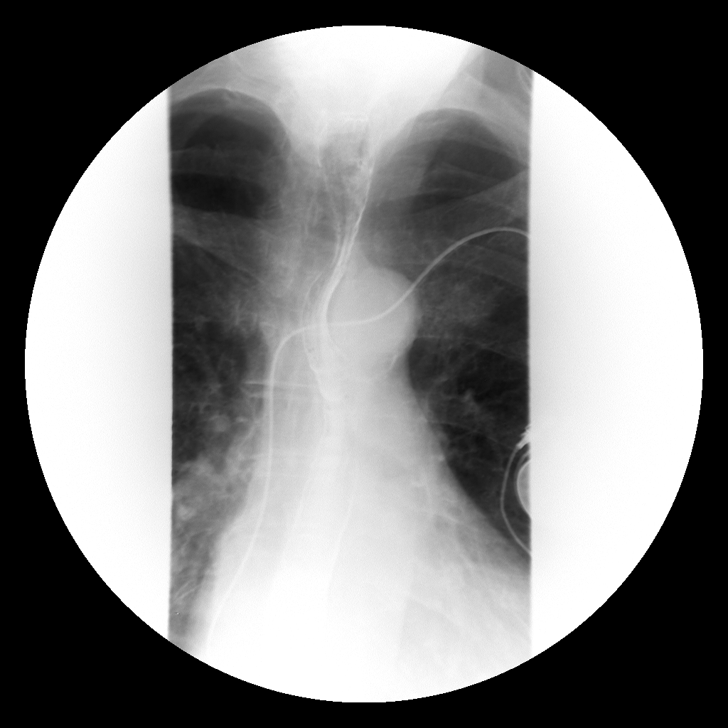

[Series 5: run · 1 of 1 slices shown (3 of 14)]
[im 1/1]
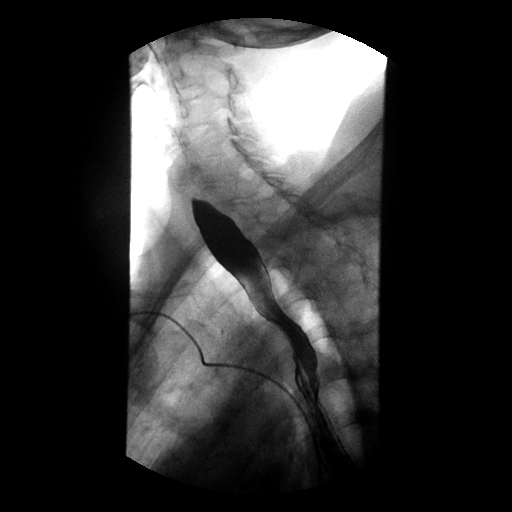

[Series 7: run · 1 of 1 slices shown (4 of 14)]
[im 1/1]
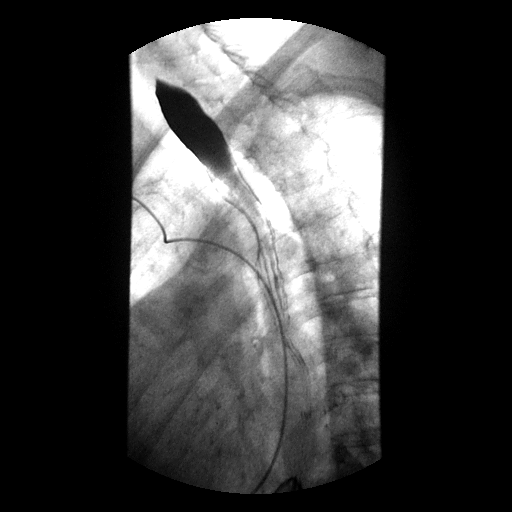

[Series 8: run · 1 of 1 slices shown (5 of 14)]
[im 1/1]
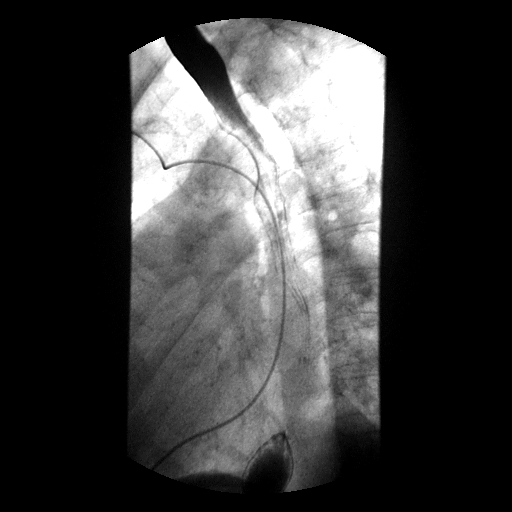

[Series 10: run · 1 of 1 slices shown (6 of 14)]
[im 1/1]
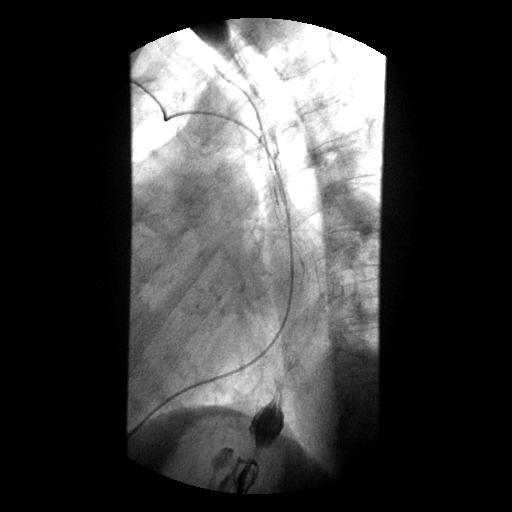

[Series 12: run · 1 of 1 slices shown (7 of 14)]
[im 1/1]
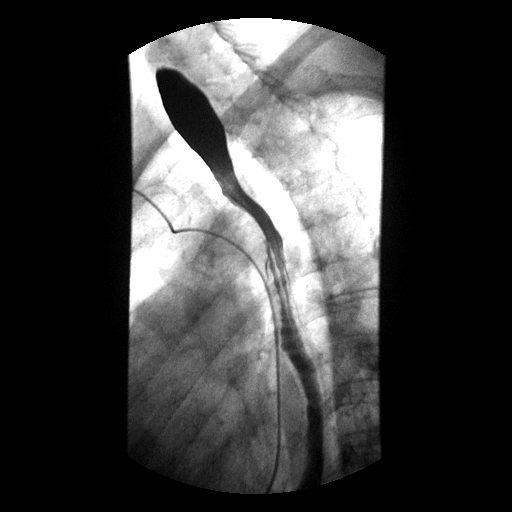

[Series 13: run · 1 of 1 slices shown (8 of 14)]
[im 1/1]
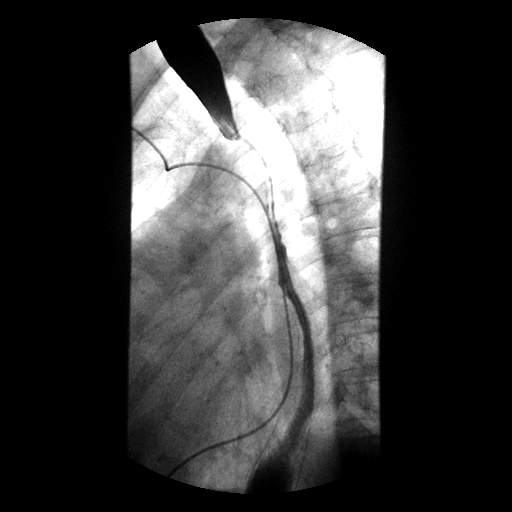

[Series 15: run · 1 of 1 slices shown (9 of 14)]
[im 1/1]
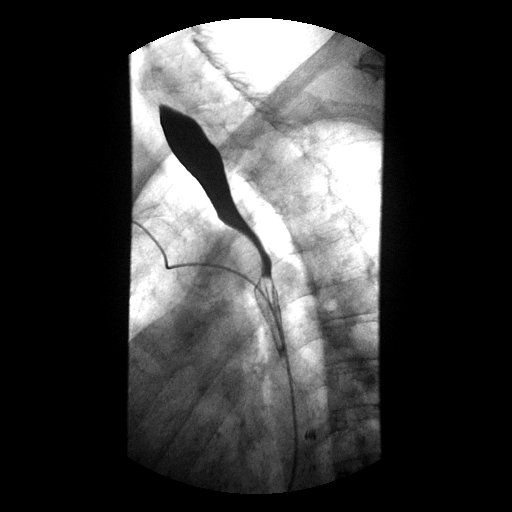

[Series 17: run · 1 of 1 slices shown (10 of 14)]
[im 1/1]
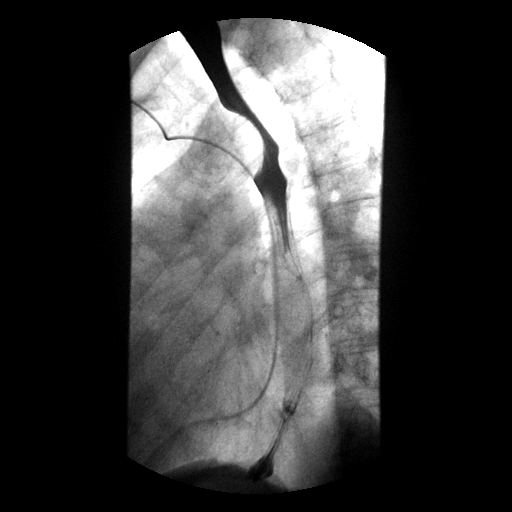

[Series 19: run · 1 of 1 slices shown (11 of 14)]
[im 1/1]
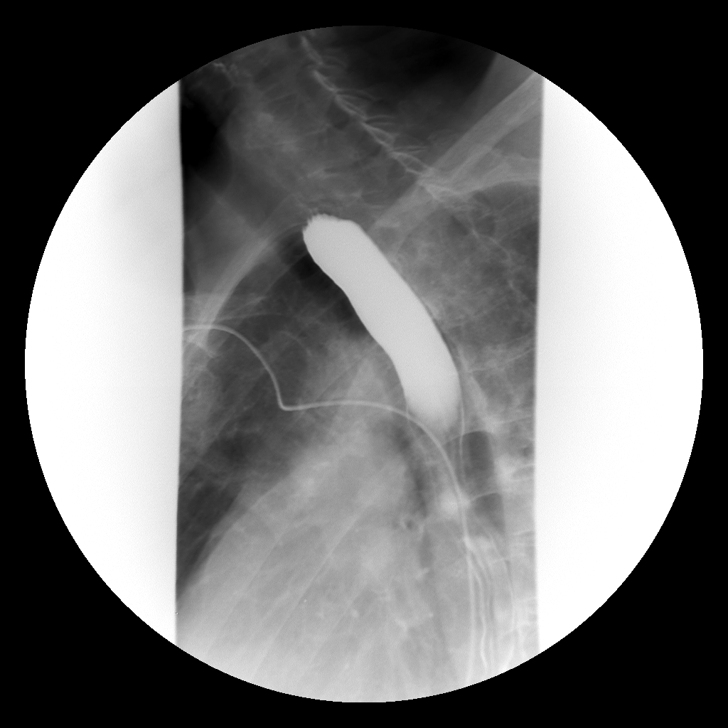

[Series 20: run · 1 of 1 slices shown (12 of 14)]
[im 1/1]
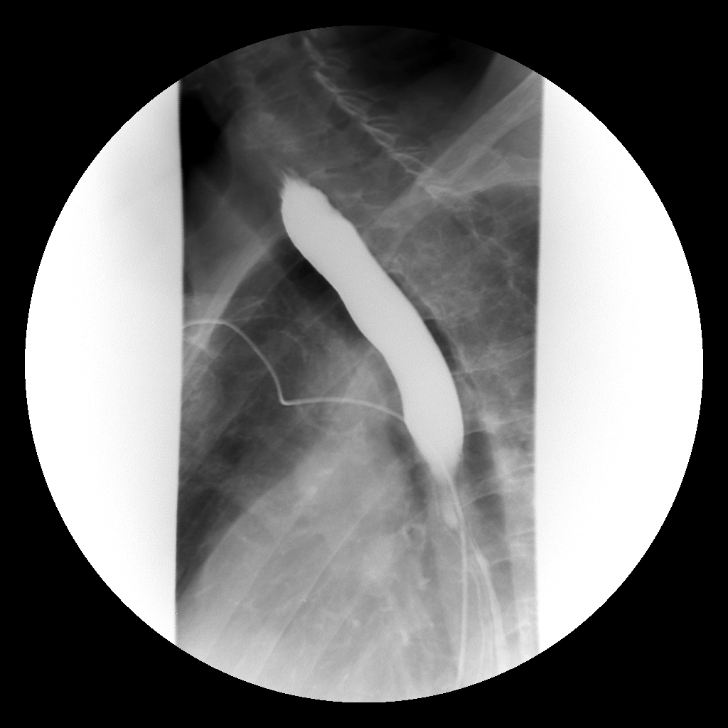

[Series 22: run · 1 of 1 slices shown (13 of 14)]
[im 1/1]
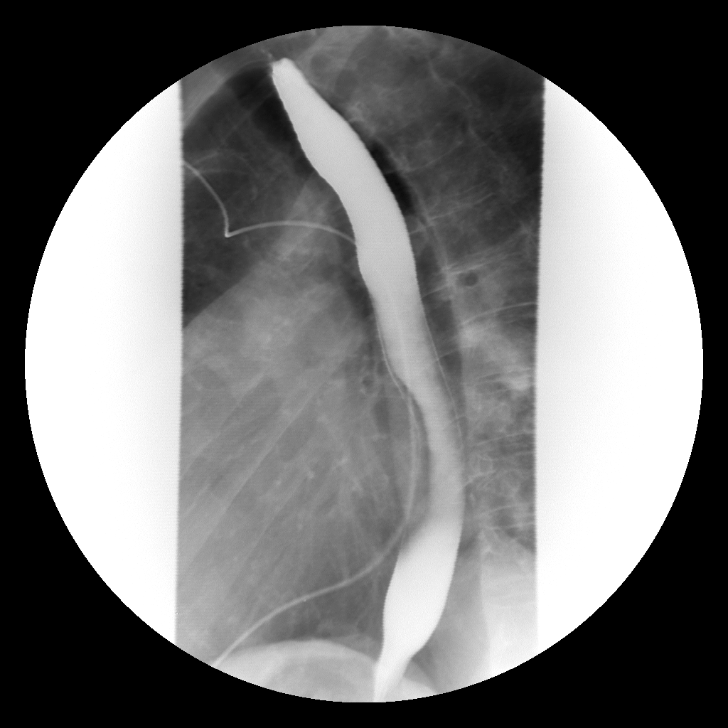

[Series 24: run · 1 of 1 slices shown (14 of 14)]
[im 1/1]
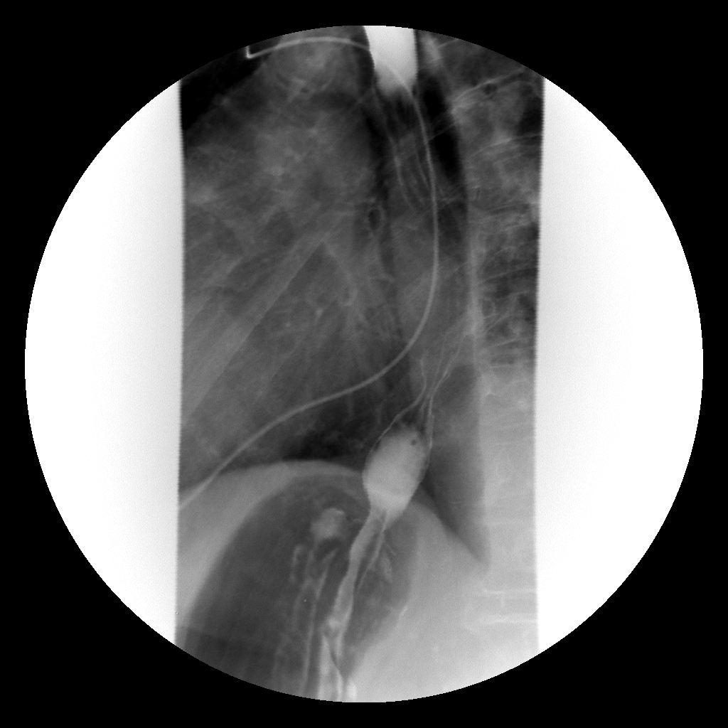

[14 of 24 positions shown; findings below may reference images not displayed]

FINDINGS: Initial double contrast images of the esophagus demonstrated a
normal appearance of the esophageal mucosa. Subsequent full column
esophagram demonstrated no esophageal mass, stricture or esophageal
ring. No hiatal hernia. Multiple single swallow attempts
demonstrated failure to propagate any of the primary peristaltic
waves, with some moderate tertiary contractions noted throughout the
examination. A barium tablet was administered, which passed readily
into the stomach.
IMPRESSION: 1. Nonspecific esophageal motility disorder with multiple tertiary
contractions.

## 2014-06-17 ENCOUNTER — Other Ambulatory Visit: Payer: Self-pay | Admitting: Family Medicine

## 2014-06-19 ENCOUNTER — Telehealth: Payer: Self-pay | Admitting: Gastroenterology

## 2014-06-19 DIAGNOSIS — R197 Diarrhea, unspecified: Secondary | ICD-10-CM

## 2014-06-19 NOTE — Telephone Encounter (Signed)
LMOM to call.

## 2014-06-19 NOTE — Telephone Encounter (Signed)
Patient called asking for DS. She said that for 2 weeks she's had diarrhea and stomach cramps. She also said her stomach rolls and gurgles and she has gas real bad. She doesn't think her pills are helping any and is there something we could recommend for her and call into her pharmacy. Please advise. Stuart:[phone]

## 2014-06-20 ENCOUNTER — Other Ambulatory Visit: Payer: Self-pay

## 2014-06-20 DIAGNOSIS — R197 Diarrhea, unspecified: Secondary | ICD-10-CM

## 2014-06-20 NOTE — Telephone Encounter (Signed)
Pt is aware and I am faxing the order for the C-Diff to [place] so she can get container there.

## 2014-06-20 NOTE — Telephone Encounter (Signed)
Routing to Neil Crouch for recommendations.

## 2014-06-20 NOTE — Telephone Encounter (Signed)
She has been on antibiotics again and has h/o recurrent C.diff.  Let's check a C.diff if she has a loose stool.  Make sure she is on a probiotic.  Make sure she is taking 1-2 creon with snacks and 3 with meals.

## 2014-06-20 NOTE — Addendum Note (Signed)
Addended by: Mahala Menghini on: 06/20/2014 09:02 AM   Modules accepted: Orders

## 2014-06-20 NOTE — Telephone Encounter (Signed)
Patient said she is NOT having any diarrhea,abd pain,vomiting, or blood in her stool she is  having a lot of gas.  She has not tried anything for her gas.  Please advise?

## 2014-06-21 ENCOUNTER — Ambulatory Visit (INDEPENDENT_AMBULATORY_CARE_PROVIDER_SITE_OTHER): Payer: Medicare Other | Admitting: Pharmacist

## 2014-06-21 ENCOUNTER — Encounter: Payer: Self-pay | Admitting: Pharmacist

## 2014-06-21 ENCOUNTER — Ambulatory Visit (INDEPENDENT_AMBULATORY_CARE_PROVIDER_SITE_OTHER): Payer: Medicare Other | Admitting: *Deleted

## 2014-06-21 VITALS — BP 102/62 | HR 74 | Temp 98.7°F

## 2014-06-21 DIAGNOSIS — I4891 Unspecified atrial fibrillation: Secondary | ICD-10-CM | POA: Diagnosis not present

## 2014-06-21 DIAGNOSIS — M810 Age-related osteoporosis without current pathological fracture: Secondary | ICD-10-CM

## 2014-06-21 DIAGNOSIS — Z23 Encounter for immunization: Secondary | ICD-10-CM | POA: Diagnosis not present

## 2014-06-21 DIAGNOSIS — I480 Paroxysmal atrial fibrillation: Secondary | ICD-10-CM | POA: Diagnosis not present

## 2014-06-21 DIAGNOSIS — F419 Anxiety disorder, unspecified: Secondary | ICD-10-CM | POA: Diagnosis not present

## 2014-06-21 LAB — POCT INR: INR: 2.5

## 2014-06-21 MED ORDER — CLONAZEPAM 0.5 MG PO TABS: 0.5000 mg | ORAL_TABLET | Freq: Three times a day (TID) | ORAL | Status: DC | PRN

## 2014-06-21 MED ORDER — HYDROCODONE-ACETAMINOPHEN 5-325 MG PO TABS: 1.0000 | ORAL_TABLET | Freq: Three times a day (TID) | ORAL | Status: DC | PRN

## 2014-06-21 NOTE — Progress Notes (Signed)
CC:  evaluate for vaccines and F/U BP  HPI:  Vaccine Evlauation - Patient was seen about 3 weeks ago for bronchitis and fluand pneumonia vaccines postponed.  She states that she is still having a little cough and asks that I listen to her lungs and chest before getting vaccines.   HTN / Afib - Checked patinet's protime today - see anticoag note Also patient's metoprolol was changed to diltiazem by cardiologist in September due to posibly worsening wheezing.    Filed Vitals:   06/21/14 1207  BP: 102/62  Pulse: 74  Temp: 98.7 F (37.1 C)   Negative for LEE bilaterally Lungs clear bilaterally.  A little wheezing heard in trachea mostly likely related to drainage. INR = 2.5 today in office  Assessment / Plan:  Vaccine Evaluation - both influenza and Prevnar 13 ordered and given today.   HTN - at goal.  Recommend continue diltiazem 180mg  daily.   Wheezing - no significant lung sounds noted.  Ask patient about willingness to stop smoking - not interested today.   A-Fib - see anticoguulation notes  Anticoagulation Dose Instructions as of 06/21/2014      Dorene Grebe Tue Wed Thu Fri Sat   New Dose 1 mg 1 mg 1 mg 1 mg 1 mg 1 mg 1 mg    Description        Continue warfarin dose of 1/2 tablet every day.      RTC in 1 month  Cherre Robins, PharmD, CPP

## 2014-06-21 NOTE — Patient Instructions (Signed)
Anticoagulation Dose Instructions as of 06/21/2014      Dorene Grebe Tue Wed Thu Fri Sat   New Dose 1 mg 1 mg 1 mg 1 mg 1 mg 1 mg 1 mg    Description        Continue warfarin dose of 1/2 tablet every day.       INR was 2.5 today

## 2014-06-25 ENCOUNTER — Other Ambulatory Visit: Payer: Self-pay | Admitting: Nurse Practitioner

## 2014-06-25 ENCOUNTER — Other Ambulatory Visit: Payer: Self-pay | Admitting: Gastroenterology

## 2014-06-27 ENCOUNTER — Other Ambulatory Visit: Payer: Self-pay | Admitting: Family Medicine

## 2014-06-29 NOTE — Telephone Encounter (Signed)
Pt called and said that she is having indigestion so  Bad. Taking the prilosec bid. Has hurt her knee and has to lay on her back which is making her indigestion worse.  She has not turned in the stool for the C-Diff yet, she has done it but needs to get it to the doctor's office [facility]).  She has been eating pork chops and cube steak. I told her she needs to eat more bland foods if her indigestion is worse.   Please advise!

## 2014-06-29 NOTE — Telephone Encounter (Signed)
Requested Cdiff 9 days ago. Patient showing noncompliance. Not sure how long the specimen is good for, can we please check on that.  Needs to avoid laying flat for 2-3 hours after a meal/snack. Try reclining rather than laying flat.   Switch to bland foods.   Continue prilosec. May add TUMS per bottle instructions.   Sounds like patient needs an OV too.

## 2014-06-29 NOTE — Telephone Encounter (Signed)
Tried to call pt- LM to return call.

## 2014-07-02 ENCOUNTER — Encounter: Payer: Self-pay | Admitting: Gastroenterology

## 2014-07-02 NOTE — Telephone Encounter (Signed)
Pt has OV for 1/13 at 11 with SF and appt letter mailed

## 2014-07-02 NOTE — Telephone Encounter (Signed)
PATIENT ON RECALL  °

## 2014-07-02 NOTE — Telephone Encounter (Signed)
Noted. Agree.

## 2014-07-02 NOTE — Telephone Encounter (Signed)
I called pt and informed her. She said she thought she did the stool specimen last Tues. Still has not got it to the lab, her son-in-law has been sick and could not take it. Per Caren Griffins at [facility], it is good for 7 days in the fridge.( Pt has it in fridge) She said she is not having much diarrhea at this time, just occasionally.  I told her if she starts having more diarrhea, she will need to get another specimen bottle and get to the lab right away. She is most concerned about the indigestion at this point. She is aware of the recommendations. She is on recall for Jan appt and I will let Manuela Schwartz and Erline Levine know to see when they can get her in.

## 2014-07-06 ENCOUNTER — Telehealth: Payer: Self-pay | Admitting: Internal Medicine

## 2014-07-06 NOTE — Telephone Encounter (Signed)
Called patient back about her elevated blood pressure. Patient states, "I have been like this for two weeks and when I lay down to go to sleep I feel like my body is jumping." Patient reports that her blood pressure has been running in the 140/80 and heart rate 75-80. Patient wants to start taking her metoprolol again, that was stopped at her visit on 04/27/2014. Patient stated, " I can see my doctor tomorrow, they are open 9am to 12pm". DOD, Dr. Marlou Porch, advised that patient not change any medication right now and follow up with her PCP as she has planned. Patient informed of these instructions and verbalized understanding. Patient had no further questions at this time.  Ewell Poe RN

## 2014-07-06 NOTE — Telephone Encounter (Signed)
[name] is calling because she is having with her blood pressure , last night it was 143/82.and she feels like her heart is beating thru her body . Please Call  Thanks

## 2014-07-09 ENCOUNTER — Encounter: Payer: Self-pay | Admitting: Cardiology

## 2014-07-09 DIAGNOSIS — Z79899 Other long term (current) drug therapy: Secondary | ICD-10-CM | POA: Diagnosis not present

## 2014-07-09 DIAGNOSIS — N3000 Acute cystitis without hematuria: Secondary | ICD-10-CM | POA: Diagnosis not present

## 2014-07-09 DIAGNOSIS — K5790 Diverticulosis of intestine, part unspecified, without perforation or abscess without bleeding: Secondary | ICD-10-CM | POA: Diagnosis not present

## 2014-07-09 DIAGNOSIS — K5730 Diverticulosis of large intestine without perforation or abscess without bleeding: Secondary | ICD-10-CM | POA: Diagnosis not present

## 2014-07-09 DIAGNOSIS — R062 Wheezing: Secondary | ICD-10-CM | POA: Diagnosis not present

## 2014-07-09 DIAGNOSIS — J40 Bronchitis, not specified as acute or chronic: Secondary | ICD-10-CM | POA: Diagnosis not present

## 2014-07-09 DIAGNOSIS — N281 Cyst of kidney, acquired: Secondary | ICD-10-CM | POA: Diagnosis not present

## 2014-07-09 DIAGNOSIS — J449 Chronic obstructive pulmonary disease, unspecified: Secondary | ICD-10-CM | POA: Diagnosis not present

## 2014-07-09 DIAGNOSIS — R0989 Other specified symptoms and signs involving the circulatory and respiratory systems: Secondary | ICD-10-CM | POA: Diagnosis not present

## 2014-07-10 ENCOUNTER — Telehealth: Payer: Self-pay | Admitting: Nurse Practitioner

## 2014-07-10 NOTE — Telephone Encounter (Signed)
Patient was told to take an extra [place] today

## 2014-07-10 NOTE — Telephone Encounter (Signed)
Spoke to MMM, she advised ok to take another Januvia tonight and to push fluids for headache, pt advised and voiced understanding, will close encounter.

## 2014-07-18 ENCOUNTER — Telehealth: Payer: Self-pay | Admitting: *Deleted

## 2014-07-18 NOTE — Telephone Encounter (Signed)
Patient c/o of feeling her heart racing when she tries to do work around her house. Patient said this started when she was taken off metoprolol. Patient said her sob is still the same since stopping metoprolol. Patient denies chest pain or dizziness. Patient given an appointment to see Bronson Ing on Friday, 07/20/14 at 11:40 am.

## 2014-07-20 ENCOUNTER — Encounter: Payer: Self-pay | Admitting: Cardiovascular Disease

## 2014-07-20 ENCOUNTER — Ambulatory Visit (INDEPENDENT_AMBULATORY_CARE_PROVIDER_SITE_OTHER): Payer: Medicare Other | Admitting: Cardiovascular Disease

## 2014-07-20 VITALS — BP 112/68 | HR 77 | Ht 64.0 in | Wt 154.0 lb

## 2014-07-20 DIAGNOSIS — R079 Chest pain, unspecified: Secondary | ICD-10-CM

## 2014-07-20 DIAGNOSIS — I5032 Chronic diastolic (congestive) heart failure: Secondary | ICD-10-CM | POA: Diagnosis not present

## 2014-07-20 DIAGNOSIS — Z950 Presence of cardiac pacemaker: Secondary | ICD-10-CM

## 2014-07-20 DIAGNOSIS — R002 Palpitations: Secondary | ICD-10-CM

## 2014-07-20 DIAGNOSIS — I350 Nonrheumatic aortic (valve) stenosis: Secondary | ICD-10-CM

## 2014-07-20 DIAGNOSIS — I495 Sick sinus syndrome: Secondary | ICD-10-CM

## 2014-07-20 DIAGNOSIS — I739 Peripheral vascular disease, unspecified: Secondary | ICD-10-CM

## 2014-07-20 DIAGNOSIS — I779 Disorder of arteries and arterioles, unspecified: Secondary | ICD-10-CM

## 2014-07-20 DIAGNOSIS — I2510 Atherosclerotic heart disease of native coronary artery without angina pectoris: Secondary | ICD-10-CM

## 2014-07-20 DIAGNOSIS — Z716 Tobacco abuse counseling: Secondary | ICD-10-CM

## 2014-07-20 DIAGNOSIS — I482 Chronic atrial fibrillation, unspecified: Secondary | ICD-10-CM

## 2014-07-20 DIAGNOSIS — I25118 Atherosclerotic heart disease of native coronary artery with other forms of angina pectoris: Secondary | ICD-10-CM | POA: Diagnosis not present

## 2014-07-20 MED ORDER — NITROGLYCERIN 0.4 MG SL SUBL: 0.4000 mg | SUBLINGUAL_TABLET | SUBLINGUAL | Status: DC | PRN

## 2014-07-20 MED ORDER — DILTIAZEM HCL ER COATED BEADS 240 MG PO CP24: 240.0000 mg | ORAL_CAPSULE | Freq: Every day | ORAL | Status: DC

## 2014-07-20 MED ORDER — DILTIAZEM HCL ER COATED BEADS 240 MG PO CP24: ORAL_CAPSULE | ORAL | Status: DC

## 2014-07-20 MED ORDER — DILTIAZEM HCL ER COATED BEADS 240 MG PO CP24
240.0000 mg | ORAL_CAPSULE | Freq: Every day | ORAL | Status: DC
Start: 2014-07-20 — End: 2014-07-20

## 2014-07-20 NOTE — Patient Instructions (Signed)
Your physician recommends that you schedule a follow-up appointment in: 1 month     Your physician has recommended you make the following change in your medication:     START nitroglycerine as directed for chest pain    INCREASE Diltiazem to 240 mg daily     Thank you for choosing [facility] !

## 2014-07-20 NOTE — Progress Notes (Signed)
Patient ID: [name], female   DOB: [birth year], 79 y.o.   MRN: [id number]      SUBJECTIVE: The patient is here for routine cardiovascular followup. She has COPD, chronic diastolic heart failure, permanent atrial fibrillation/flutter and has a pacemaker. Device interrogation on 04/27/14 demonstrated normal device function with one high ventricular rate noted in 07/2013.  She was taken off of metoprolol by Dr. Rayann Heman given her significant COPD with wheezing.  She also has a h/o mild nonobstructive CAD and a previous tachycardia-induced cardiomyopathy which has since normalized in function. Her most recent echocardiogram in November 2014 showed normal left ventricular systolic function and very mild aortic stenosis, with no evidence of mitral stenosis and moderate biatrial enlargement.  She was reportedly recently treated for acute on chronic bronchitis and urinary tract infection at [facility], but those records are unavailable to me. She has been experiencing more frequent palpitations for the past 2 weeks. Dr. Rayann Heman stopped her metoprolol due to wheezing in September. She has not noticed a significant change in her breathing since then. She had an episode of chest pain which lasted a few minutes this morning, but denies having had any chest pain in the past several months. Coronary angiography on 04/04/2010 demonstrated a 40% proximal LAD lesion, 20% mid left circumflex coronary artery lesion, and the RCA had a 20% proximal, 50-60% mid vessel, and distal 30% stenosis.   She continues to smoke 0.5 ppd.   Review of Systems: As per "subjective", otherwise negative.  Allergies  Allergen Reactions  . Torsemide Nausea And Vomiting  . Macrobid [Nitrofurantoin Monohyd Macro] Rash  . Tramadol Nausea Only    Current Outpatient Prescriptions  Medication Sig Dispense Refill  . bisacodyl (DULCOLAX) 5 MG EC tablet Take 5 mg by mouth daily as needed for constipation.    .  clonazePAM (KLONOPIN) 0.5 MG tablet Take 1 tablet (0.5 mg total) by mouth 3 (three) times daily as needed for anxiety. 90 tablet 0  . diltiazem (CARDIZEM CD) 180 MG 24 hr capsule Take 1 capsule (180 mg total) by mouth daily. 30 capsule 6  . furosemide (LASIX) 40 MG tablet TAKE ONE TABLET BY MOUTH ONE TIME DAILY 30 tablet 6  . HYDROcodone-acetaminophen (NORCO/VICODIN) 5-325 MG per tablet Take 1 tablet by mouth 3 (three) times daily as needed for moderate pain. 90 tablet 0  . JANUVIA 50 MG tablet TAKE ONE TABLET BY MOUTH ONE TIME DAILY 30 tablet 2  . lipase/protease/amylase (CREON) 36000 UNITS CPEP capsule 3 PO WITH MEALS AND 1 WITH SNACK 540 capsule 3  . omeprazole (PRILOSEC) 40 MG capsule TAKE 1 CAPSULE BY MOUTH TWICE A DAY BEFORE A MEAL 60 capsule 3  . PROAIR HFA 108 (90 BASE) MCG/ACT inhaler USE 2 PUFFS BY MOUTH EVERY 3   TO 4 HOURS WHILE AWAKE AS NEEDED 18 g 4  . Probiotic Product ([place]) CAPS Take 1 capsule by mouth daily.    [name] PROLIA 60 MG/ML SOLN injection INJECT 60 MG INTO THE SKIN ONCE. ADMINISTER IN UPPER ARM, THIGH, OR AB DOMEN 1 mL 2  . simvastatin (ZOCOR) 10 MG tablet TAKE ONE TABLET BY MOUTH ONE  TIME DAILY 30 tablet 4  . SYMBICORT 160-4.5 MCG/ACT inhaler INHALE 2 PUFFS INTO THE LUNGS 2 (TWO) TIMES DAILY. 11 g 4  . tiotropium (SPIRIVA HANDIHALER) 18 MCG inhalation capsule INHALE CONTENTS ONE CAPSULE BY MOUTH ONE TIME DAILY 30 capsule 3  . warfarin (COUMADIN) 2 MG tablet TAKE PER PHYSICIAN INSTRUCTIONS  45 tablet 2  . zafirlukast (ACCOLATE) 20 MG tablet TAKE ONE TABLET BY MOUTH TWICE DAILY 60 tablet 4   No current facility-administered medications for this visit.    Past Medical History  Diagnosis Date  . Permanent atrial fibrillation     H/o tachybradycardia syndrome on Coumadin anticoagulation, has pacemaker.  [name] CAD (coronary artery disease) 2011    Catheterization August 2011: mild nonobstructive coronary artery disease complicated by large left rectus sheath  hematoma status post evacuation Coumadin restarted without recurrence  . Diabetes mellitus   . Chronic airway obstruction, not elsewhere classified   . Anemia, unspecified   . Chronic kidney disease, stage II (mild)   . Unspecified hypertensive kidney disease with chronic kidney disease stage I through stage IV, or unspecified   . Tachycardia-bradycardia     s/p St. Jude single chamber PPM 10/2010   . GERD (gastroesophageal reflux disease)   . Abnormal CT of the chest     Mediastinal and hilar adenopathy followed by Dr. Koleen Nimrod in [place]  . Congestive heart failure, unspecified     EF now 60%, previous nonischemic cardiomyopathy  . Pacemaker     Single chamber [employer]. Jude ACCENT [phone] SN 719 04/11/1959 10/31/2010  . Valvular heart disease     a. H/o moderate mitral stenosis by cath 2011 but no mention of this on 10/2011 echo. b. 10/2011 echo: mod MR, mild AS, mild TR; EF>65%  . Hypercholesterolemia   . Diverticulitis Dec 2012  . Body mass index (BMI) of 20.0-20.9 in adult FEB 2013 147 LBS  . Recurrent UTI   . Neurogenic sleep apnea     Uses noctural oxygen prescribed by her pulmonologi  . Rheumatic heart disease   . Gastritis     By EGD  . Carotid artery disease     Doppler, 05/2012, 0-39% bilateral  . Vitamin D deficiency   . Dilated bile duct 10/19/2011  . Mitral regurgitation 06/27/2013    Past Surgical History  Procedure Laterality Date  . Single-chamber pacemaker implantation  3/12    by Dr Caryl Comes  . Evacuation of retroperitoneal and left rectus sheath    . Cholecystectomy      40+ years ago  . Hernia repair      X3  . Pilonidal cyst / sinus excision    . Esophagogastroduodenoscopy  10/2011    Fields-erosive gastritis, biopsy with no H. pylori, hiatal hernia, peptic duodenitis, no celiac disease, duodenal diverticulum, duodenal nodule  . Colonoscopy  10/2011    Dyann Ruddle sigmoid to descending diverticulosis, internal hemorrhoids  . Eus  11/16/11    Mishra-13 MM CBD,  normal pancreatic duct, otherwise normal EUS    History   Social History  . Marital Status: Single    Spouse Name: N/A    Number of Children: 2  . Years of Education: N/A   Occupational History  . RETIRED    Social History Main Topics  . Smoking status: Current Every Day Smoker -- 0.50 packs/day for 45 years    Types: Cigarettes    Start date: 02/05/1958  . Smokeless tobacco: Never Used     Comment: smokes 6-7cigarettes daily  . Alcohol Use: No  . Drug Use: No  . Sexual Activity: Not Currently   Other Topics Concern  . Not on file   Social History Narrative     Filed Vitals:   07/20/14 1131  BP: 112/68  Pulse: 77  Height: 5\' 4"  (1.626 m)  Weight: 154 lb (69.854  kg)    PHYSICAL EXAM General: NAD HEENT: Poor dentition. Plaque noted on tongue. Neck: No JVD, no thyromegaly. Lungs: Prolonged expiratory phase with end-expiratory wheezes, no rales, poor air entry. CV: Nondisplaced PMI. Regular rate and rhythm, normal S1/S2, no S3/S4, II/VI ejection systolic murmur heard at RUSB and LUSB. No pretibial or periankle edema. Chronic stasis dermatitis. Abdomen: Soft, nontender, no distention.  Neurologic: Alert and oriented.  Psych: Normal affect. Skin: Normal. Musculoskeletal: No gross deformities. Extremities: No clubbing or cyanosis.   ECG: Most recent ECG reviewed.   ASSESSMENT AND PLAN: 1. Palpitations with permanent atrial fibrillation s/p pacemaker: She has been experiencing more frequent palpitations. I am not inclined to reintroduce metoprolol given her severe COPD. I will increase long-acting diltiazem to 240 mg daily. Device interrogation on 04/27/14 demonstrated normal device function with one high ventricular rate noted in 07/2013. Continue warfarin. No bleeding complications noted.  2. Valvular heart disease: Stable from a symptomatic standpoint with very mild aortic stenosis by echo in 06/2013. Continue monitoring.  3. Carotid artery stenosis: Mild  bilaterally in October 2013.  4. Chronic diastolic heart failure: Compensated and symptomatically stable.  5. Tobacco abuse: Cessation counseling again provided today.  6. Type II diabetes: On Januvia.  7. COPD: On Proair, Spiriva, and Accolate with h/o tobacco abuse. 8. Chest pain in the context of CAD: As she has only had one episode in the past several months, I do not feel nuclear stress testing is warranted at this time. I will prescribe SL nitroglycerin to be used prn.  Dispo: f/u 1 month.  Kate Sable, M.D., F.A.C.C.

## 2014-07-24 ENCOUNTER — Ambulatory Visit (INDEPENDENT_AMBULATORY_CARE_PROVIDER_SITE_OTHER): Payer: Medicare Other | Admitting: Pharmacist Clinician (PhC)/ Clinical Pharmacy Specialist

## 2014-07-24 DIAGNOSIS — E118 Type 2 diabetes mellitus with unspecified complications: Secondary | ICD-10-CM | POA: Diagnosis not present

## 2014-07-24 DIAGNOSIS — F419 Anxiety disorder, unspecified: Secondary | ICD-10-CM

## 2014-07-24 DIAGNOSIS — I4891 Unspecified atrial fibrillation: Secondary | ICD-10-CM

## 2014-07-24 DIAGNOSIS — I480 Paroxysmal atrial fibrillation: Secondary | ICD-10-CM

## 2014-07-24 DIAGNOSIS — M810 Age-related osteoporosis without current pathological fracture: Secondary | ICD-10-CM

## 2014-07-24 LAB — POCT HEMOGLOBIN: HEMOGLOBIN: 9.8 g/dL — AB (ref 12.2–16.2)

## 2014-07-24 LAB — POCT INR: INR: 1.5

## 2014-07-24 LAB — POCT GLYCOSYLATED HEMOGLOBIN (HGB A1C): Hemoglobin A1C: 5.6

## 2014-07-24 MED ORDER — HYDROCODONE-ACETAMINOPHEN 5-325 MG PO TABS: 1.0000 | ORAL_TABLET | Freq: Three times a day (TID) | ORAL | Status: DC | PRN

## 2014-07-24 MED ORDER — CLONAZEPAM 0.5 MG PO TABS: 0.5000 mg | ORAL_TABLET | Freq: Three times a day (TID) | ORAL | Status: DC | PRN

## 2014-07-24 NOTE — Patient Instructions (Signed)
Anticoagulation Dose Instructions as of 07/24/2014      Dorene Grebe Tue Wed Thu Fri Sat   New Dose 1 mg 1 mg 1 mg 1 mg 1 mg 1 mg 1 mg    Description        Take 1 tablet today only then resume regular schedule

## 2014-07-30 ENCOUNTER — Encounter: Payer: Medicare Other | Admitting: *Deleted

## 2014-07-30 ENCOUNTER — Telehealth: Payer: Self-pay | Admitting: Cardiology

## 2014-07-30 NOTE — Telephone Encounter (Signed)
LMOVM reminding pt to send remote transmission.   

## 2014-07-31 ENCOUNTER — Encounter: Payer: Self-pay | Admitting: Cardiology

## 2014-07-31 ENCOUNTER — Ambulatory Visit (INDEPENDENT_AMBULATORY_CARE_PROVIDER_SITE_OTHER): Payer: Medicare Other | Admitting: *Deleted

## 2014-07-31 DIAGNOSIS — I495 Sick sinus syndrome: Secondary | ICD-10-CM | POA: Diagnosis not present

## 2014-08-01 ENCOUNTER — Other Ambulatory Visit: Payer: Self-pay | Admitting: Internal Medicine

## 2014-08-01 NOTE — Progress Notes (Signed)
Remote pacemaker transmission.   

## 2014-08-04 LAB — MDC_IDC_ENUM_SESS_TYPE_REMOTE
Battery Remaining Longevity: 115 mo
Battery Remaining Percentage: 77 %
Battery Voltage: 2.96 V
Brady Statistic RV Percent Paced: 17 %
Date Time Interrogation Session: 20151223023105
Implantable Pulse Generator Model: 1110
Implantable Pulse Generator Serial Number: 7199163
Lead Channel Pacing Threshold Amplitude: 0.5 V
Lead Channel Pacing Threshold Pulse Width: 0.5 ms
Lead Channel Sensing Intrinsic Amplitude: 12 mV
Lead Channel Setting Sensing Sensitivity: 2 mV
MDC IDC MSMT LEADCHNL RV IMPEDANCE VALUE: 840 Ohm
MDC IDC SET LEADCHNL RV PACING AMPLITUDE: 2.5 V
MDC IDC SET LEADCHNL RV PACING PULSEWIDTH: 0.5 ms

## 2014-08-05 ENCOUNTER — Other Ambulatory Visit: Payer: Self-pay | Admitting: Family Medicine

## 2014-08-05 ENCOUNTER — Other Ambulatory Visit: Payer: Self-pay | Admitting: Nurse Practitioner

## 2014-08-07 ENCOUNTER — Ambulatory Visit (INDEPENDENT_AMBULATORY_CARE_PROVIDER_SITE_OTHER): Payer: Medicare Other | Admitting: Family Medicine

## 2014-08-07 ENCOUNTER — Encounter: Payer: Self-pay | Admitting: Family Medicine

## 2014-08-07 VITALS — BP 128/74 | HR 86 | Temp 98.1°F | Ht 64.0 in | Wt 152.6 lb

## 2014-08-07 DIAGNOSIS — I2510 Atherosclerotic heart disease of native coronary artery without angina pectoris: Secondary | ICD-10-CM

## 2014-08-07 DIAGNOSIS — J206 Acute bronchitis due to rhinovirus: Secondary | ICD-10-CM

## 2014-08-07 MED ORDER — BENZONATATE 100 MG PO CAPS: 100.0000 mg | ORAL_CAPSULE | Freq: Three times a day (TID) | ORAL | Status: DC | PRN

## 2014-08-07 MED ORDER — AMOXICILLIN 875 MG PO TABS: 875.0000 mg | ORAL_TABLET | Freq: Two times a day (BID) | ORAL | Status: DC

## 2014-08-07 MED ORDER — LEVALBUTEROL HCL 1.25 MG/3ML IN NEBU
1.2500 mg | INHALATION_SOLUTION | Freq: Once | RESPIRATORY_TRACT | Status: AC
  Administered 2014-08-07: 1.25 mg via RESPIRATORY_TRACT

## 2014-08-07 MED ORDER — METHYLPREDNISOLONE ACETATE 80 MG/ML IJ SUSP
80.0000 mg | Freq: Once | INTRAMUSCULAR | Status: AC
  Administered 2014-08-07: 80 mg via INTRAMUSCULAR

## 2014-08-07 MED ORDER — METHYLPREDNISOLONE (PAK) 4 MG PO TABS: ORAL_TABLET | ORAL | Status: DC

## 2014-08-07 MED ORDER — LEVALBUTEROL HCL 1.25 MG/3ML IN NEBU: 1.2500 mg | INHALATION_SOLUTION | Freq: Once | RESPIRATORY_TRACT | Status: DC

## 2014-08-07 NOTE — Progress Notes (Signed)
   Subjective:    Patient ID: [name], female    DOB: 11-01-1934, 79 y.o.   MRN: [id number]  HPI Patient is here for c/o cough and uri sx's.  Review of Systems  Constitutional: Negative for fever.  HENT: Negative for ear pain.   Eyes: Negative for discharge.  Respiratory: Negative for cough.   Cardiovascular: Negative for chest pain.  Gastrointestinal: Negative for abdominal distention.  Endocrine: Negative for polyuria.  Genitourinary: Negative for difficulty urinating.  Musculoskeletal: Negative for gait problem and neck pain.  Skin: Negative for color change and rash.  Neurological: Negative for speech difficulty and headaches.  Psychiatric/Behavioral: Negative for agitation.       Objective:    BP 128/74 mmHg  Pulse 86  Temp(Src) 98.1 F (36.7 C) (Oral)  Ht 5\' 4"  (1.626 m)  Wt 152 lb 9.6 oz (69.219 kg)  BMI 26.18 kg/m2 Physical Exam  Constitutional: She is oriented to person, place, and time. She appears well-developed and well-nourished.  HENT:  Head: Normocephalic and atraumatic.  Mouth/Throat: Oropharynx is clear and moist.  Eyes: Pupils are equal, round, and reactive to light.  Neck: Normal range of motion. Neck supple.  Cardiovascular: Normal rate and regular rhythm.   No murmur heard. Pulmonary/Chest: Effort normal and breath sounds normal.  Abdominal: Soft. Bowel sounds are normal. There is no tenderness.  Neurological: She is alert and oriented to person, place, and time.  Skin: Skin is warm and dry.  Psychiatric: She has a normal mood and affect.          Assessment & Plan:     ICD-9-CM ICD-10-CM   1. Acute bronchitis due to Rhinovirus 466.0 J20.6 amoxicillin (AMOXIL) 875 MG tablet   079.3  benzonatate (TESSALON PERLES) 100 MG capsule     methylPREDNIsolone (MEDROL DOSPACK) 4 MG tablet     methylPREDNISolone acetate (DEPO-MEDROL) injection 80 mg     levalbuterol (XOPENEX) nebulizer solution 1.25 mg     levalbuterol (XOPENEX) nebulizer  solution 1.25 mg     No Follow-up on file.  Lysbeth Penner FNP

## 2014-08-08 ENCOUNTER — Encounter: Payer: Self-pay | Admitting: Cardiology

## 2014-08-14 ENCOUNTER — Encounter: Payer: Self-pay | Admitting: Pharmacist Clinician (PhC)/ Clinical Pharmacy Specialist

## 2014-08-17 ENCOUNTER — Encounter: Payer: Self-pay | Admitting: *Deleted

## 2014-08-20 ENCOUNTER — Ambulatory Visit (INDEPENDENT_AMBULATORY_CARE_PROVIDER_SITE_OTHER): Payer: Commercial Managed Care - HMO | Admitting: Pharmacist

## 2014-08-20 ENCOUNTER — Ambulatory Visit (INDEPENDENT_AMBULATORY_CARE_PROVIDER_SITE_OTHER): Payer: Commercial Managed Care - HMO | Admitting: Family Medicine

## 2014-08-20 VITALS — BP 138/70 | HR 70

## 2014-08-20 VITALS — BP 124/66 | HR 80 | Temp 97.1°F | Ht 64.0 in | Wt 146.0 lb

## 2014-08-20 DIAGNOSIS — I4891 Unspecified atrial fibrillation: Secondary | ICD-10-CM | POA: Diagnosis not present

## 2014-08-20 DIAGNOSIS — I480 Paroxysmal atrial fibrillation: Secondary | ICD-10-CM | POA: Diagnosis not present

## 2014-08-20 DIAGNOSIS — J441 Chronic obstructive pulmonary disease with (acute) exacerbation: Secondary | ICD-10-CM

## 2014-08-20 DIAGNOSIS — D649 Anemia, unspecified: Secondary | ICD-10-CM

## 2014-08-20 LAB — POCT CBC
Granulocyte percent: 75.7 %G (ref 37–80)
HCT, POC: 40.2 % (ref 37.7–47.9)
Hemoglobin: 12.8 g/dL (ref 12.2–16.2)
Lymph, poc: 2.3 (ref 0.6–3.4)
MCH: 30.1 pg (ref 27–31.2)
MCHC: 32 g/dL (ref 31.8–35.4)
MCV: 94.2 fL (ref 80–97)
MPV: 7.7 fL (ref 0–99.8)
POC Granulocyte: 10.1 — AB (ref 2–6.9)
POC LYMPH %: 17.5 % (ref 10–50)
Platelet Count, POC: 235 10*3/uL (ref 142–424)
RBC: 4.3 M/uL (ref 4.04–5.48)
RDW, POC: 16.7 %
WBC: 13.4 10*3/uL — AB (ref 4.6–10.2)

## 2014-08-20 LAB — POCT INR: INR: 1.9

## 2014-08-20 MED ORDER — LEVOFLOXACIN 500 MG PO TABS: 500.0000 mg | ORAL_TABLET | Freq: Every day | ORAL | Status: DC

## 2014-08-20 MED ORDER — METHYLPREDNISOLONE ACETATE 80 MG/ML IJ SUSP
80.0000 mg | Freq: Once | INTRAMUSCULAR | Status: AC
  Administered 2014-08-20: 80 mg via INTRAMUSCULAR

## 2014-08-20 MED ORDER — BENZONATATE 100 MG PO CAPS: 100.0000 mg | ORAL_CAPSULE | Freq: Three times a day (TID) | ORAL | Status: DC | PRN

## 2014-08-20 MED ORDER — PREDNISONE 10 MG PO TABS: ORAL_TABLET | ORAL | Status: DC

## 2014-08-20 MED ORDER — LEVALBUTEROL HCL 1.25 MG/0.5ML IN NEBU
1.2500 mg | INHALATION_SOLUTION | Freq: Once | RESPIRATORY_TRACT | Status: AC
  Administered 2014-08-20: 1.25 mg via RESPIRATORY_TRACT

## 2014-08-20 NOTE — Progress Notes (Signed)
   Subjective:    Patient ID: Ferol Pickney, female    DOB: [birth year], 80 y.o.   MRN: [id number]  HPI Patient is here for c/o SOB and cough.  She has hx of COPD.  Review of Systems  Constitutional: Negative for fever.  HENT: Negative for ear pain.   Eyes: Negative for discharge.  Respiratory: Negative for cough.   Cardiovascular: Negative for chest pain.  Gastrointestinal: Negative for abdominal distention.  Endocrine: Negative for polyuria.  Genitourinary: Negative for difficulty urinating.  Musculoskeletal: Negative for gait problem and neck pain.  Skin: Negative for color change and rash.  Neurological: Negative for speech difficulty and headaches.  Psychiatric/Behavioral: Negative for agitation.       Objective:    BP 124/66 mmHg  Pulse 80  Temp(Src) 97.1 F (36.2 C) (Oral)  Ht 5\' 4"  (1.626 m)  Wt 146 lb (66.225 kg)  BMI 25.05 kg/m2 Physical Exam  Constitutional: She is oriented to person, place, and time. She appears well-developed and well-nourished.  HENT:  Head: Normocephalic and atraumatic.  Mouth/Throat: Oropharynx is clear and moist.  Eyes: Pupils are equal, round, and reactive to light.  Neck: Normal range of motion. Neck supple.  Cardiovascular: Normal rate and regular rhythm.   No murmur heard. Pulmonary/Chest: Effort normal and breath sounds normal.  Abdominal: Soft. Bowel sounds are normal. There is no tenderness.  Neurological: She is alert and oriented to person, place, and time.  Skin: Skin is warm and dry.  Psychiatric: She has a normal mood and affect.          Assessment & Plan:     ICD-9-CM ICD-10-CM   1. COPD exacerbation 491.21 J44.1 predniSONE (DELTASONE) 10 MG tablet     methylPREDNISolone acetate (DEPO-MEDROL) injection 80 mg     levalbuterol (XOPENEX) nebulizer solution 1.25 mg     benzonatate (TESSALON PERLES) 100 MG capsule     levofloxacin (LEVAQUIN) 500 MG tablet   Push po fluids, rest, tylenol and motrin otc prn as  directed for fever, arthralgias, and myalgias.  Follow up prn if sx's continue or persist.  Return if symptoms worsen or fail to improve.  Lysbeth Penner FNP

## 2014-08-20 NOTE — Patient Instructions (Signed)
Anticoagulation Dose Instructions as of 08/20/2014      [name] Tue Wed Thu Fri Sat   New Dose 1 mg 1 mg 1 mg 1 mg 1 mg 1 mg 1 mg    Description        Take 1 tablet of warfarin today, then continue current warfarin dose of 1/2 tablet every day.       INR was 1.9 today

## 2014-08-22 ENCOUNTER — Ambulatory Visit: Payer: Medicare Other | Admitting: Gastroenterology

## 2014-08-22 ENCOUNTER — Telehealth: Payer: Self-pay | Admitting: Nurse Practitioner

## 2014-08-22 DIAGNOSIS — F419 Anxiety disorder, unspecified: Secondary | ICD-10-CM

## 2014-08-22 DIAGNOSIS — M810 Age-related osteoporosis without current pathological fracture: Secondary | ICD-10-CM

## 2014-08-22 MED ORDER — CLONAZEPAM 0.5 MG PO TABS: 0.5000 mg | ORAL_TABLET | Freq: Three times a day (TID) | ORAL | Status: DC | PRN

## 2014-08-22 MED ORDER — HYDROCODONE-ACETAMINOPHEN 5-325 MG PO TABS: 1.0000 | ORAL_TABLET | Freq: Three times a day (TID) | ORAL | Status: DC | PRN

## 2014-08-22 NOTE — Telephone Encounter (Signed)
rx ready for pickup 

## 2014-08-22 NOTE — Telephone Encounter (Signed)
Pt aware.

## 2014-08-28 ENCOUNTER — Encounter: Payer: Self-pay | Admitting: Pharmacist Clinician (PhC)/ Clinical Pharmacy Specialist

## 2014-08-30 ENCOUNTER — Encounter: Payer: Self-pay | Admitting: Internal Medicine

## 2014-09-02 ENCOUNTER — Other Ambulatory Visit: Payer: Self-pay | Admitting: Nurse Practitioner

## 2014-09-05 ENCOUNTER — Ambulatory Visit: Payer: Medicare Other | Admitting: Cardiovascular Disease

## 2014-09-10 ENCOUNTER — Ambulatory Visit (INDEPENDENT_AMBULATORY_CARE_PROVIDER_SITE_OTHER): Payer: Commercial Managed Care - HMO | Admitting: Pharmacist

## 2014-09-10 DIAGNOSIS — M810 Age-related osteoporosis without current pathological fracture: Secondary | ICD-10-CM | POA: Diagnosis not present

## 2014-09-10 DIAGNOSIS — I4891 Unspecified atrial fibrillation: Secondary | ICD-10-CM | POA: Diagnosis not present

## 2014-09-10 DIAGNOSIS — I480 Paroxysmal atrial fibrillation: Secondary | ICD-10-CM

## 2014-09-10 LAB — POCT INR: INR: 2.3

## 2014-09-10 MED ORDER — ALBUTEROL SULFATE HFA 108 (90 BASE) MCG/ACT IN AERS: INHALATION_SPRAY | RESPIRATORY_TRACT | Status: DC

## 2014-09-10 MED ORDER — PANTOPRAZOLE SODIUM 40 MG PO TBEC: 40.0000 mg | DELAYED_RELEASE_TABLET | Freq: Two times a day (BID) | ORAL | Status: DC

## 2014-09-10 MED ORDER — DENOSUMAB 60 MG/ML ~~LOC~~ SOLN
60.0000 mg | Freq: Once | SUBCUTANEOUS | Status: AC
  Administered 2014-09-10: 60 mg via SUBCUTANEOUS

## 2014-09-10 NOTE — Patient Instructions (Addendum)
Anticoagulation Dose Instructions as of 09/10/2014      [name] Tue Wed Thu Fri Sat   New Dose 1 mg 1 mg 1 mg 1 mg 1 mg 1 mg 1 mg    Description         continue current warfarin dose of 1/2 tablet every day.       INR was 2.3 today   Melatonin oral capsules and tablets  Try melatonin 3mg  1 tablet in the evening What is this medicine? MELATONIN (mel uh TOH nin) is a dietary supplement. It is promoted to help maintain normal sleep patterns. The FDA has not approved this supplement for any medical use. This supplement may be used for other purposes; ask your health care provider or pharmacist if you have questions. This medicine may be used for other purposes; ask your health care provider or pharmacist if you have questions. COMMON BRAND NAME(S): Melatonex What should I tell my health care provider before I take this medicine? They need to know if you have any of these conditions: -cancer -if you frequently drink alcohol containing drinks -immune system problems -liver disease -seizure disorder -an unusual or allergic reaction to melatonin, other medicines, foods, dyes, or preservatives -pregnant or trying to get pregnant -breast-feeding How should I use this medicine? Take this supplement by mouth with a glass of water. This supplement is usually taken prior to bedtime. Do not chew or crush most tablets or capsules. Some tablets are chewable and are chewed before swallowing. Some tablets are meant to be dissolved in the mouth or under the tongue. Follow the directions on the package labeling, or take as directed by your health care professional. Do not take this supplement more often than directed. Talk to your pediatrician regarding the use of this supplement in children. This supplement is not recommended for use in children. Overdosage: If you think you have taken too much of this medicine contact a poison control center or emergency room at once. NOTE: This medicine is only for  you. Do not share this medicine with others. What if I miss a dose? This does not apply; this medicine is not for regular use. Do not take double or extra doses. What may interact with this medicine? Check with your doctor or healthcare professional if you are taking any of the following medications: -hormone medicines -medicines for blood pressure like nifedipine -medications for anxiety, depression, or other emotional or psychiatric problems -medications for seizures -medications for sleep -other herbal or dietary supplements -tamoxifen -treatments for cancer or immune disorders This list may not describe all possible interactions. Give your health care provider a list of all the medicines, herbs, non-prescription drugs, or dietary supplements you use. Also tell them if you smoke, drink alcohol, or use illegal drugs. Some items may interact with your medicine. What should I watch for while using this medicine? See your doctor if your symptoms do not get better or if they get worse. Do not take this supplement for more than 2 weeks unless your doctor tells you to. You may get drowsy or dizzy. Do not drive, use machinery, or do anything that needs mental alertness until you know how this medicine affects you. Do not stand or sit up quickly, especially if you are an older patient. This reduces the risk of dizzy or fainting spells. Alcohol may interfere with the effect of this medicine. Avoid alcoholic drinks. Talk to your doctor before you use this supplement if you are currently being treated  for an emotional, mental, or sleep problem. This medicine may interfere with your treatment. Herbal or dietary supplements are not regulated like medicines. Rigid quality control standards are not required for dietary supplements. The purity and strength of these products can vary. The safety and effect of this dietary supplement for a certain disease or illness is not well known. This product is not intended  to diagnose, treat, cure or prevent any disease. The Food and Drug Administration suggests the following to help consumers protect themselves: -Always read product labels and follow directions. -Natural does not mean a product is safe for humans to take. -Look for products that include USP after the ingredient name. This means that the manufacturer followed the standards of the [place] Pharmacopoeia. -Supplements made or sold by a nationally known food or drug company are more likely to be made under tight controls. You can write to the company for more information about how the product was made. What side effects may I notice from receiving this medicine? Side effects that you should report to your doctor or health care professional as soon as possible: -allergic reactions like skin rash, itching or hives, swelling of the face, lips, or tongue -breathing problems -confusion, forgetful -depressed, nervous, or other mood changes -fast or pounding heartbeat -trouble staying awake or alert during the day Side effects that usually do not require medical attention (report to your doctor or health care professional if they continue or are bothersome): -drowsiness, dizziness -headache -nightmares -upset stomach This list may not describe all possible side effects. Call your doctor for medical advice about side effects. You may report side effects to FDA at 1-800-FDA-1088. Where should I keep my medicine? Keep out of the reach of children. Store at room temperature or as directed on the package label. Protect from moisture. Throw away any unused supplement after the expiration date. NOTE: This sheet is a summary. It may not cover all possible information. If you have questions about this medicine, talk to your doctor, pharmacist, or health care provider.  2015, Elsevier/Gold Standard. (2013-06-09 18:36:27)

## 2014-09-10 NOTE — Progress Notes (Signed)
Patient ID: [name], female   DOB: 07-28-1935, 80 y.o.   MRN: [id number]  HPI: Patient with osteoporosis - lowest T-Score is -3.4 at L-2.   She last received Prolia injection 02/2014.  She is due next injection today.   Back Pain?  Yes       Kyphosis?  Yes Prior fracture?  No Med(s) for Osteoporosis/Osteopenia:  none Med(s) previously tried for Osteoporosis/Osteopenia:  none                                                             FH/SH: Family history of osteoporosis?  No Parent with history of hip fracture?  No Family history of breast cancer?  No Exercise?  No Smoking?  Yes - trying to quite Alcohol?  No     DEXA Results Date of Test T-Score for AP Spine L1-L4 T-Score for Total Left Hip T-Score for Total Right Hip  11/01/2013 -1.4 -1.9 -2.2  11/29/2006 -1.2 -1.1 -1.2            **T-Score for L-2 was -3.4**   FRAX 10 year estimate: (based on T-Score of -2.2) Total FX risk:  17%  (consider medication if >/= 20%) Hip FX risk:  7.2%  (consider medication if >/= 3%)  Assessment: Osteoporosis based on Tscore for L-2  Therpeutic anticoagulation   Recommendations: 1.  Prolia 60mg  given SQ today in left arm 2.   Anticoagulation Dose Instructions as of 09/10/2014      Dorene Grebe Tue Wed Thu Fri Sat   New Dose 1 mg 1 mg 1 mg 1 mg 1 mg 1 mg 1 mg    Description         continue current warfarin dose of 1/2 tablet every day.      Recheck INR 1 month Recheck DEXA:  10/2015 Next Prolia 6 month  Time spent counseling patient:  20 minutes   Cherre Robins, PharmD, CPP

## 2014-09-18 DIAGNOSIS — L84 Corns and callosities: Secondary | ICD-10-CM | POA: Diagnosis not present

## 2014-09-18 DIAGNOSIS — B351 Tinea unguium: Secondary | ICD-10-CM | POA: Diagnosis not present

## 2014-09-18 DIAGNOSIS — E1152 Type 2 diabetes mellitus with diabetic peripheral angiopathy with gangrene: Secondary | ICD-10-CM | POA: Diagnosis not present

## 2014-09-19 ENCOUNTER — Ambulatory Visit (INDEPENDENT_AMBULATORY_CARE_PROVIDER_SITE_OTHER): Payer: Commercial Managed Care - HMO | Admitting: Gastroenterology

## 2014-09-19 ENCOUNTER — Other Ambulatory Visit: Payer: Self-pay | Admitting: Nurse Practitioner

## 2014-09-19 ENCOUNTER — Encounter: Payer: Self-pay | Admitting: Gastroenterology

## 2014-09-19 VITALS — BP 113/76 | HR 89 | Temp 97.6°F | Ht 63.0 in | Wt 146.2 lb

## 2014-09-19 DIAGNOSIS — R634 Abnormal weight loss: Secondary | ICD-10-CM

## 2014-09-19 DIAGNOSIS — R197 Diarrhea, unspecified: Secondary | ICD-10-CM

## 2014-09-19 DIAGNOSIS — F419 Anxiety disorder, unspecified: Secondary | ICD-10-CM

## 2014-09-19 DIAGNOSIS — M810 Age-related osteoporosis without current pathological fracture: Secondary | ICD-10-CM

## 2014-09-19 MED ORDER — HYDROCODONE-ACETAMINOPHEN 5-325 MG PO TABS: 1.0000 | ORAL_TABLET | Freq: Three times a day (TID) | ORAL | Status: DC | PRN

## 2014-09-19 MED ORDER — CLONAZEPAM 0.5 MG PO TABS: 0.5000 mg | ORAL_TABLET | Freq: Three times a day (TID) | ORAL | Status: DC | PRN

## 2014-09-19 NOTE — Telephone Encounter (Signed)
rx ready for pickup 

## 2014-09-19 NOTE — Progress Notes (Signed)
ON RECALL LIST  °

## 2014-09-19 NOTE — Progress Notes (Signed)
Subjective:    Patient ID: [name], female    DOB: 1935-05-10, 80 y.o.   MRN: [id number]  Redge Gainer, MD  HPI C/O HEARTBURN BOTH SIDES ALL NIGHT LONG. WOKE UP WITH HEARTBURN THIS AM. [place]. LASTS FOR < 15 MINS. MAY HAVE BURPING. BURNING 1-2X/WEEK. CHANGED OMEPRAZOLE BECAUSE MEDICAID WON;T PAY FOR IT. O NOW GOING TO PROTONIX.    TAKING MEDS 30 MINS BEFORE HER MEAL-WAFFLES/SYRUP. DECAF CUP 1 DAILY. ATE STEAK , INSTANT CREAM POTATOES, AND BOILED ASPARAGUS. BMs; A LITTLE EVERY DAY. TAKING AMITIZA.  PT DENIES FEVER, CHILLS, HEMATOCHEZIA,  nausea, vomiting, melena, diarrhea, CHEST PAIN, SHORTNESS OF BREATH,   constipation, abdominal pain, problems swallowing, problems with sedation, heartburn or indigestion. LOST 9 LBS SINCE SEP 2015. BEEN TAKING PREDNISONE AND ABX FOR BRONCHITIS SINCE JAN 2016.  Past Medical History  Diagnosis Date  . Permanent atrial fibrillation     H/o tachybradycardia syndrome on Coumadin anticoagulation, has pacemaker.  [name] CAD (coronary artery disease) 2011    Catheterization August 2011: mild nonobstructive coronary artery disease complicated by large left rectus sheath hematoma status post evacuation Coumadin restarted without recurrence  . Diabetes mellitus   . Chronic airway obstruction, not elsewhere classified   . Anemia, unspecified   . Chronic kidney disease, stage II (mild)   . Unspecified hypertensive kidney disease with chronic kidney disease stage I through stage IV, or unspecified   . Tachycardia-bradycardia     s/p St. Jude single chamber PPM 10/2010   . GERD (gastroesophageal reflux disease)   . Abnormal CT of the chest     Mediastinal and hilar adenopathy followed by Dr. Koleen Nimrod in [place]  . Congestive heart failure, unspecified     EF now 60%, previous nonischemic cardiomyopathy  . Pacemaker     Single chamber [place]. Jude ACCENT [phone] SN [id number] 04/11/1959 10/31/2010  . Valvular heart disease     a. H/o moderate mitral stenosis by cath 2011 but  no mention of this on 10/2011 echo. b. 10/2011 echo: mod MR, mild AS, mild TR; EF>65%  . Hypercholesterolemia   . Diverticulitis Dec 2012  . Body mass index (BMI) of 20.0-20.9 in adult FEB 2013 147 LBS  . Recurrent UTI   . Neurogenic sleep apnea     Uses noctural oxygen prescribed by her pulmonologi  . Rheumatic heart disease   . Gastritis     By EGD  . Carotid artery disease     Doppler, 05/2012, 0-39% bilateral  . Vitamin D deficiency   . Dilated bile duct 10/19/2011  . Mitral regurgitation 06/27/2013   Past Surgical History  Procedure Laterality Date  . Single-chamber pacemaker implantation  3/12    by Dr Caryl Comes  . Evacuation of retroperitoneal and left rectus sheath    . Cholecystectomy      40+ years ago  . Hernia repair      X3  . Pilonidal cyst / sinus excision    . Esophagogastroduodenoscopy  10/2011    Anavi Branscum-erosive gastritis, biopsy with no H. pylori, hiatal hernia, peptic duodenitis, no celiac disease, duodenal diverticulum, duodenal nodule  . Colonoscopy  10/2011    Dyann Ruddle sigmoid to descending diverticulosis, internal hemorrhoids  . Eus  11/16/11    Mishra-13 MM CBD, normal pancreatic duct, otherwise normal EUS    Allergies  Allergen Reactions  . Torsemide Nausea And Vomiting  . Macrobid [Nitrofurantoin Monohyd Macro] Rash  . Tramadol Nausea Only    Current Outpatient Prescriptions  Medication Sig Dispense Refill  .  albuterol (VENTOLIN HFA) 108 (90 BASE) MCG/ACT inhaler Inhale 1 to 2 puffs into lung every 4 to 6 hours as needed for SOB / wheezing    .      . clonazePAM (KLONOPIN) 0.5 MG tablet Take 1 tablet (0.5 mg total) by mouth 3 (three) times daily as needed for anxiety.    [name] diltiazem (CARDIZEM CD) 240 MG 24 hr capsule Take one tab daily    . furosemide (LASIX) 40 MG tablet TAKE ONE TABLET BY MOUTH ONE TIME DAILY    . HYDROcodone-acetaminophen (NORCO/VICODIN) 5-325 MG per tablet Take 1 tablet by mouth 3 (three) times daily as needed for moderate pain.  2X/DAY PRN   . JANUVIA 50 MG tablet     . lipase/protease/amylase (CREON) 36000 UNITS CPEP capsule 3 PO WITH MEALS AND 1 WITH SNACK    . nitroGLYCERIN (NITROSTAT) 0.4 MG SL tablet Place 1 tablet (0.4 mg total) under the tongue every 5 (five) minutes as needed for chest pain.    . pantoprazole (PROTONIX) 40 MG tablet Take 1 tablet (40 mg total) by mouth 2 (two) times daily.    . Probiotic Product ([place]) CAPS Take 1 capsule by mouth daily.    [name] PROLIA 60 MG/ML SOLN injection INJECT 60 MG INTO THE SKIN ONCE. ADMINISTER IN UPPER ARM, THIGH, OR AB DOMEN    . simvastatin (ZOCOR) 10 MG tablet TAKE ONE TABLET BY MOUTH ONE  TIME DAILY    . SPIRIVA HANDIHALER 18 MCG inhalation capsule INHALE CONTENTS ONE CAPSULE BY MOUTH ONE TIME DAILY    . SYMBICORT 160-4.5 MCG/ACT inhaler INHALE 2 PUFFS INTO THE LUNGS 2 (TWO) TIMES DAILY.    [name] warfarin (COUMADIN) 2 MG tablet TAKE PER PHYSICIAN INSTRUCTIONS    . zafirlukast (ACCOLATE) 20 MG tablet TAKE ONE TABLET BY MOUTH TWICE DAILY     Review of Systems     Objective:   Physical Exam  Constitutional: She is oriented to person, place, and time. She appears well-developed and well-nourished. No distress.  HENT:  Head: Normocephalic and atraumatic.  Mouth/Throat: Oropharynx is clear and moist. No oropharyngeal exudate.  Eyes: Pupils are equal, round, and reactive to light. No scleral icterus.  Neck: Normal range of motion. Neck supple.  Cardiovascular: Normal rate, regular rhythm and normal heart sounds.   Pulmonary/Chest: Effort normal and breath sounds normal. No respiratory distress.  Abdominal: Soft. Bowel sounds are normal. She exhibits no distension. There is no tenderness.  MIDLINE BULGE THAT INCREASES WITH VALSALVA, REDUCIBLE   Musculoskeletal: She exhibits no edema.  Lymphadenopathy:    She has no cervical adenopathy.  Neurological: She is alert and oriented to person, place, and time.  NO FOCAL DEFICITS   Psychiatric:  SLIGHTLY  ANXIOUS MOOD, NL AFFECT  Vitals reviewed.         Assessment & Plan:

## 2014-09-19 NOTE — Assessment & Plan Note (Signed)
SX IMPROVED WITH CREON.  CONTINUE CREON. FOLLOW UP IN 6 MOS.

## 2014-09-19 NOTE — Patient Instructions (Signed)
CONTINUE CREON.  CONTINUE PROTONIX. TAKE 30 MINUTES PRIOR TO MEALS TWICE DAILY.  USE PEPCID OR ZANTAC AS NEEDED FOR HEARTBURN.  FOLLOW UP IN 6 MOS.

## 2014-09-19 NOTE — Assessment & Plan Note (Signed)
ASSOCIATED WITH FLARES OF BRONCHITIS.  CONTINUE YOUR WEIGHT LOSS EFFORTS. FOLLOW UP IN 6 MOS.

## 2014-09-20 NOTE — Telephone Encounter (Signed)
Left message that script is ready to be picked up at front desk.

## 2014-09-26 ENCOUNTER — Telehealth: Payer: Self-pay | Admitting: Family Medicine

## 2014-09-26 NOTE — Telephone Encounter (Signed)
Stp advised we don't have any openings today, pt wanted to see MMM. Advised MMM not here today, pt states she would go to urgent care.

## 2014-09-27 ENCOUNTER — Telehealth: Payer: Self-pay | Admitting: Cardiovascular Disease

## 2014-09-27 ENCOUNTER — Telehealth: Payer: Self-pay | Admitting: Nurse Practitioner

## 2014-09-27 DIAGNOSIS — J441 Chronic obstructive pulmonary disease with (acute) exacerbation: Secondary | ICD-10-CM | POA: Diagnosis not present

## 2014-09-27 DIAGNOSIS — R05 Cough: Secondary | ICD-10-CM | POA: Diagnosis not present

## 2014-09-27 DIAGNOSIS — I482 Chronic atrial fibrillation: Secondary | ICD-10-CM | POA: Diagnosis not present

## 2014-09-27 DIAGNOSIS — R1033 Periumbilical pain: Secondary | ICD-10-CM | POA: Diagnosis not present

## 2014-09-27 DIAGNOSIS — I4891 Unspecified atrial fibrillation: Secondary | ICD-10-CM | POA: Diagnosis not present

## 2014-09-27 DIAGNOSIS — E871 Hypo-osmolality and hyponatremia: Secondary | ICD-10-CM | POA: Diagnosis not present

## 2014-09-27 DIAGNOSIS — R109 Unspecified abdominal pain: Secondary | ICD-10-CM | POA: Diagnosis not present

## 2014-09-27 DIAGNOSIS — R531 Weakness: Secondary | ICD-10-CM | POA: Diagnosis not present

## 2014-09-27 DIAGNOSIS — I428 Other cardiomyopathies: Secondary | ICD-10-CM | POA: Diagnosis not present

## 2014-09-27 DIAGNOSIS — Z7901 Long term (current) use of anticoagulants: Secondary | ICD-10-CM | POA: Diagnosis not present

## 2014-09-27 DIAGNOSIS — Z79891 Long term (current) use of opiate analgesic: Secondary | ICD-10-CM | POA: Diagnosis not present

## 2014-09-27 DIAGNOSIS — J449 Chronic obstructive pulmonary disease, unspecified: Secondary | ICD-10-CM | POA: Diagnosis not present

## 2014-09-27 NOTE — Telephone Encounter (Signed)
Pt states her "whole body is shaking all over " for past 3 weeks.Walks with walker,is diabetic but blood sugars run ok per pt Has apt with pcp tonight.She states she will call tomorrow and update me on apt with pcp

## 2014-09-27 NOTE — Telephone Encounter (Signed)
Please call patient regarding symptoms she is having. / tgs

## 2014-09-27 NOTE — Telephone Encounter (Signed)
Stp advised she would need to be seen to be evaluated pt states she doesn't have a ride today but will try to have her son arranged for bringing her here or urgent care.

## 2014-09-28 ENCOUNTER — Telehealth: Payer: Self-pay | Admitting: Family Medicine

## 2014-09-28 ENCOUNTER — Ambulatory Visit: Payer: Self-pay

## 2014-09-28 DIAGNOSIS — I482 Chronic atrial fibrillation: Secondary | ICD-10-CM | POA: Diagnosis not present

## 2014-09-28 DIAGNOSIS — I428 Other cardiomyopathies: Secondary | ICD-10-CM | POA: Diagnosis not present

## 2014-09-28 DIAGNOSIS — J441 Chronic obstructive pulmonary disease with (acute) exacerbation: Secondary | ICD-10-CM | POA: Diagnosis not present

## 2014-09-28 DIAGNOSIS — R531 Weakness: Secondary | ICD-10-CM | POA: Diagnosis not present

## 2014-09-28 DIAGNOSIS — E871 Hypo-osmolality and hyponatremia: Secondary | ICD-10-CM | POA: Diagnosis not present

## 2014-09-28 DIAGNOSIS — Z79891 Long term (current) use of opiate analgesic: Secondary | ICD-10-CM | POA: Diagnosis not present

## 2014-09-28 DIAGNOSIS — I4891 Unspecified atrial fibrillation: Secondary | ICD-10-CM | POA: Diagnosis not present

## 2014-09-28 DIAGNOSIS — R109 Unspecified abdominal pain: Secondary | ICD-10-CM | POA: Diagnosis not present

## 2014-09-28 DIAGNOSIS — Z7901 Long term (current) use of anticoagulants: Secondary | ICD-10-CM | POA: Diagnosis not present

## 2014-09-29 DIAGNOSIS — I2510 Atherosclerotic heart disease of native coronary artery without angina pectoris: Secondary | ICD-10-CM | POA: Diagnosis not present

## 2014-09-29 DIAGNOSIS — K3189 Other diseases of stomach and duodenum: Secondary | ICD-10-CM | POA: Diagnosis not present

## 2014-09-29 DIAGNOSIS — J90 Pleural effusion, not elsewhere classified: Secondary | ICD-10-CM | POA: Diagnosis not present

## 2014-09-29 DIAGNOSIS — I509 Heart failure, unspecified: Secondary | ICD-10-CM | POA: Diagnosis not present

## 2014-09-29 DIAGNOSIS — Z7901 Long term (current) use of anticoagulants: Secondary | ICD-10-CM | POA: Diagnosis not present

## 2014-09-29 DIAGNOSIS — J8410 Pulmonary fibrosis, unspecified: Secondary | ICD-10-CM | POA: Diagnosis not present

## 2014-09-29 DIAGNOSIS — R531 Weakness: Secondary | ICD-10-CM | POA: Diagnosis not present

## 2014-09-29 DIAGNOSIS — I428 Other cardiomyopathies: Secondary | ICD-10-CM | POA: Diagnosis not present

## 2014-09-29 DIAGNOSIS — J441 Chronic obstructive pulmonary disease with (acute) exacerbation: Secondary | ICD-10-CM | POA: Diagnosis not present

## 2014-09-29 DIAGNOSIS — E871 Hypo-osmolality and hyponatremia: Secondary | ICD-10-CM | POA: Diagnosis not present

## 2014-09-29 DIAGNOSIS — I4891 Unspecified atrial fibrillation: Secondary | ICD-10-CM | POA: Diagnosis not present

## 2014-09-29 DIAGNOSIS — R109 Unspecified abdominal pain: Secondary | ICD-10-CM | POA: Diagnosis not present

## 2014-09-29 DIAGNOSIS — I482 Chronic atrial fibrillation: Secondary | ICD-10-CM | POA: Diagnosis not present

## 2014-09-29 DIAGNOSIS — Z79891 Long term (current) use of opiate analgesic: Secondary | ICD-10-CM | POA: Diagnosis not present

## 2014-09-29 NOTE — Progress Notes (Signed)
CC'ED TO PCP 

## 2014-09-30 DIAGNOSIS — J441 Chronic obstructive pulmonary disease with (acute) exacerbation: Secondary | ICD-10-CM | POA: Diagnosis not present

## 2014-09-30 DIAGNOSIS — R109 Unspecified abdominal pain: Secondary | ICD-10-CM | POA: Diagnosis not present

## 2014-09-30 DIAGNOSIS — I509 Heart failure, unspecified: Secondary | ICD-10-CM | POA: Diagnosis not present

## 2014-09-30 DIAGNOSIS — Z79891 Long term (current) use of opiate analgesic: Secondary | ICD-10-CM | POA: Diagnosis not present

## 2014-09-30 DIAGNOSIS — E871 Hypo-osmolality and hyponatremia: Secondary | ICD-10-CM | POA: Diagnosis not present

## 2014-09-30 DIAGNOSIS — R531 Weakness: Secondary | ICD-10-CM | POA: Diagnosis not present

## 2014-09-30 DIAGNOSIS — Z7901 Long term (current) use of anticoagulants: Secondary | ICD-10-CM | POA: Diagnosis not present

## 2014-09-30 DIAGNOSIS — I428 Other cardiomyopathies: Secondary | ICD-10-CM | POA: Diagnosis not present

## 2014-09-30 DIAGNOSIS — I4891 Unspecified atrial fibrillation: Secondary | ICD-10-CM | POA: Diagnosis not present

## 2014-09-30 DIAGNOSIS — I482 Chronic atrial fibrillation: Secondary | ICD-10-CM | POA: Diagnosis not present

## 2014-10-01 ENCOUNTER — Ambulatory Visit (INDEPENDENT_AMBULATORY_CARE_PROVIDER_SITE_OTHER): Payer: Commercial Managed Care - HMO | Admitting: Cardiovascular Disease

## 2014-10-01 ENCOUNTER — Encounter: Payer: Self-pay | Admitting: Cardiovascular Disease

## 2014-10-01 ENCOUNTER — Telehealth: Payer: Self-pay | Admitting: Gastroenterology

## 2014-10-01 VITALS — BP 94/58 | HR 91 | Ht 63.0 in | Wt 152.0 lb

## 2014-10-01 DIAGNOSIS — I480 Paroxysmal atrial fibrillation: Secondary | ICD-10-CM

## 2014-10-01 DIAGNOSIS — Z9289 Personal history of other medical treatment: Secondary | ICD-10-CM

## 2014-10-01 DIAGNOSIS — I350 Nonrheumatic aortic (valve) stenosis: Secondary | ICD-10-CM

## 2014-10-01 DIAGNOSIS — Z720 Tobacco use: Secondary | ICD-10-CM | POA: Diagnosis not present

## 2014-10-01 DIAGNOSIS — I25118 Atherosclerotic heart disease of native coronary artery with other forms of angina pectoris: Secondary | ICD-10-CM

## 2014-10-01 DIAGNOSIS — I495 Sick sinus syndrome: Secondary | ICD-10-CM

## 2014-10-01 DIAGNOSIS — Z950 Presence of cardiac pacemaker: Secondary | ICD-10-CM

## 2014-10-01 DIAGNOSIS — I5033 Acute on chronic diastolic (congestive) heart failure: Secondary | ICD-10-CM

## 2014-10-01 DIAGNOSIS — R531 Weakness: Secondary | ICD-10-CM

## 2014-10-01 DIAGNOSIS — Z87898 Personal history of other specified conditions: Secondary | ICD-10-CM

## 2014-10-01 MED ORDER — DILTIAZEM HCL 120 MG PO TABS: 120.0000 mg | ORAL_TABLET | Freq: Every day | ORAL | Status: DC

## 2014-10-01 NOTE — Patient Instructions (Signed)
Your physician recommends that you schedule a follow-up appointment in: 6 Weeks with Dr. Bronson Ing  Your physician has recommended you make the following change in your medication:   Decrease Diltiazem to 120 mg Daily  Your physician has requested that you have an echocardiogram. Echocardiography is a painless test that uses sound waves to create images of your heart. It provides your doctor with information about the size and shape of your heart and how well your heart's chambers and valves are working. This procedure takes approximately one hour. There are no restrictions for this procedure.   Thank you for choosing [facility]!

## 2014-10-01 NOTE — Telephone Encounter (Signed)
I called pt. She had some problems and had to go to the ED at [facility] last Thursday and stayed until yesterday. Her sodium was low and they gave her fluids, then she got too much fluid  Around her heart. They have added Lasix and Potassium to her meds. However, they told her to take one of the Omeprazole daily, not two. ( She had not started the Protonix, trying to finish the Omeprazole) Said she will probably pick up the Protonix tomorrow, and see if it helps. She just wants to make sure it is still OK to take Protonix bid. Please advise!

## 2014-10-01 NOTE — Telephone Encounter (Signed)
Pt has questions about the dosage of her protonix. She had asked to speak with DS but DS had left early for the day. I told patient that I would take a message for the nurse to get back with her. Please call [phone]

## 2014-10-01 NOTE — Progress Notes (Signed)
Patient ID: [name], female   DOB: August 19, 1934, 80 y.o.   MRN: [id number]      SUBJECTIVE: The patient is here for routine cardiovascular follow up and was recently hospitalized at [facility]. She has COPD, chronic diastolic heart failure, permanent atrial fibrillation/flutter and has a pacemaker. Device interrogation on 08/04/14 demonstrated normal device function with no high ventricular rates. She was previoulsy taken off of metoprolol by Dr. Rayann Heman given her significant COPD with wheezing.  She also has a h/o mild nonobstructive CAD and a previous tachycardia-induced cardiomyopathy which has since normalized in function. Her most recent echocardiogram in November 2014 showed normal left ventricular systolic function and very mild aortic stenosis, with no evidence of mitral stenosis and moderate biatrial enlargement.  Coronary angiography on 04/04/2010 demonstrated a 40% proximal LAD lesion, 20% mid left circumflex coronary artery lesion, and the RCA had a 20% proximal, 50-60% mid vessel, and distal 30% stenosis.  She tells me she was hospitalized for weakness and hyponatremia as well as for congestive heart failure. She brought some blood tests performed there which demonstrated a sodium of 132. I do not have copies of any chest imaging. She does not think an echocardiogram was performed. She continues to feel weak but denies chest pain and palpitations. Blood pressure is 94/58 and heart rate is 91 bpm.   Review of Systems: As per "subjective", otherwise negative.  Allergies  Allergen Reactions  . Torsemide Nausea And Vomiting  . Macrobid [Nitrofurantoin Monohyd Macro] Rash  . Tramadol Nausea Only    Current Outpatient Prescriptions  Medication Sig Dispense Refill  . albuterol (VENTOLIN HFA) 108 (90 BASE) MCG/ACT inhaler Inhale 1 to 2 puffs into lung every 4 to 6 hours as needed for SOB / wheezing 1 Inhaler 2  . bisacodyl (DULCOLAX) 5 MG EC tablet Take 5 mg by mouth daily as  needed for constipation.    . clonazePAM (KLONOPIN) 0.5 MG tablet Take 1 tablet (0.5 mg total) by mouth 3 (three) times daily as needed for anxiety. 90 tablet 0  . furosemide (LASIX) 40 MG tablet TAKE ONE TABLET BY MOUTH ONE TIME DAILY 30 tablet 6  . HYDROcodone-acetaminophen (NORCO/VICODIN) 5-325 MG per tablet Take 1 tablet by mouth 3 (three) times daily as needed for moderate pain. 90 tablet 0  . JANUVIA 50 MG tablet     . lipase/protease/amylase (CREON) 36000 UNITS CPEP capsule 3 PO WITH MEALS AND 1 WITH SNACK 540 capsule 3  . nitroGLYCERIN (NITROSTAT) 0.4 MG SL tablet Place 1 tablet (0.4 mg total) under the tongue every 5 (five) minutes as needed for chest pain. 25 tablet 3  . pantoprazole (PROTONIX) 40 MG tablet Take 1 tablet (40 mg total) by mouth 2 (two) times daily. 180 tablet 1  . Probiotic Product ([place]) CAPS Take 1 capsule by mouth daily.    [name] PROLIA 60 MG/ML SOLN injection INJECT 60 MG INTO THE SKIN ONCE. ADMINISTER IN UPPER ARM, THIGH, OR AB DOMEN 1 mL 2  . simvastatin (ZOCOR) 10 MG tablet TAKE ONE TABLET BY MOUTH ONE  TIME DAILY 30 tablet 4  . SPIRIVA HANDIHALER 18 MCG inhalation capsule INHALE CONTENTS ONE CAPSULE BY MOUTH ONE TIME DAILY 30 capsule 2  . SYMBICORT 160-4.5 MCG/ACT inhaler INHALE 2 PUFFS INTO THE LUNGS 2 (TWO) TIMES DAILY. 11 g 4  . warfarin (COUMADIN) 2 MG tablet TAKE PER PHYSICIAN INSTRUCTIONS 45 tablet 1  . zafirlukast (ACCOLATE) 20 MG tablet TAKE ONE TABLET BY MOUTH TWICE DAILY 60  tablet 3   Current Facility-Administered Medications  Medication Dose Route Frequency Provider Last Rate Last Dose  . levalbuterol (XOPENEX) nebulizer solution 1.25 mg  1.25 mg Nebulization Once Lysbeth Penner, FNP   1.25 mg at 08/07/14 1815    Past Medical History  Diagnosis Date  . Permanent atrial fibrillation     H/o tachybradycardia syndrome on Coumadin anticoagulation, has pacemaker.  [name] CAD (coronary artery disease) 2011    Catheterization August 2011: mild  nonobstructive coronary artery disease complicated by large left rectus sheath hematoma status post evacuation Coumadin restarted without recurrence  . Diabetes mellitus   . Chronic airway obstruction, not elsewhere classified   . Anemia, unspecified   . Chronic kidney disease, stage II (mild)   . Unspecified hypertensive kidney disease with chronic kidney disease stage I through stage IV, or unspecified   . Tachycardia-bradycardia     s/p St. Jude single chamber PPM 10/2010   . GERD (gastroesophageal reflux disease)   . Abnormal CT of the chest     Mediastinal and hilar adenopathy followed by Dr. Koleen Nimrod in [place]  . Congestive heart failure, unspecified     EF now 60%, previous nonischemic cardiomyopathy  . Pacemaker     Single chamber [place]. Jude ACCENT [phone] SN [id number] 04/11/1959 10/31/2010  . Valvular heart disease     a. H/o moderate mitral stenosis by cath 2011 but no mention of this on 10/2011 echo. b. 10/2011 echo: mod MR, mild AS, mild TR; EF>65%  . Hypercholesterolemia   . Diverticulitis Dec 2012  . Body mass index (BMI) of 20.0-20.9 in adult FEB 2013 147 LBS  . Recurrent UTI   . Neurogenic sleep apnea     Uses noctural oxygen prescribed by her pulmonologi  . Rheumatic heart disease   . Gastritis     By EGD  . Carotid artery disease     Doppler, 05/2012, 0-39% bilateral  . Vitamin D deficiency   . Dilated bile duct 10/19/2011  . Mitral regurgitation 06/27/2013    Past Surgical History  Procedure Laterality Date  . Single-chamber pacemaker implantation  3/12    by Dr Caryl Comes  . Evacuation of retroperitoneal and left rectus sheath    . Cholecystectomy      40+ years ago  . Hernia repair      X3  . Pilonidal cyst / sinus excision    . Esophagogastroduodenoscopy  10/2011    Fields-erosive gastritis, biopsy with no H. pylori, hiatal hernia, peptic duodenitis, no celiac disease, duodenal diverticulum, duodenal nodule  . Colonoscopy  10/2011    Dyann Ruddle sigmoid to descending  diverticulosis, internal hemorrhoids  . Eus  11/16/11    Mishra-13 MM CBD, normal pancreatic duct, otherwise normal EUS    History   Social History  . Marital Status: Single    Spouse Name: N/A  . Number of Children: 2  . Years of Education: N/A   Occupational History  . RETIRED    Social History Main Topics  . Smoking status: Current Every Day Smoker -- 0.50 packs/day for 45 years    Types: Cigarettes    Start date: 02/05/1958  . Smokeless tobacco: Never Used     Comment: smokes 6-7cigarettes daily  . Alcohol Use: No  . Drug Use: No  . Sexual Activity: Not Currently   Other Topics Concern  . Not on file   Social History Narrative     Filed Vitals:   10/01/14 1012  Height: 5\' 3"  (1.6 m)  Weight: 152 lb (68.947 kg)   BP 94/58  Pulse 91  SpO2 96%   PHYSICAL EXAM General: NAD HEENT: Poor dentition. Plaque noted on tongue. Neck: No JVD, no thyromegaly. Lungs: Prolonged expiratory phase with end-expiratory wheezes, no rales, poor air entry. CV: Nondisplaced PMI. Regular rate and rhythm, normal S1/S2, no S3/S4, II/VI ejection systolic murmur heard at RUSB and LUSB. No pretibial or periankle edema. Chronic stasis dermatitis. Abdomen: Soft, nontender, no distention.  Neurologic: Alert and oriented.  Psych: Normal affect. Skin: Normal. Musculoskeletal: No gross deformities. Extremities: No clubbing or cyanosis.   ECG: Most recent ECG reviewed.      ASSESSMENT AND PLAN: 1. Palpitations with permanent atrial fibrillation s/p pacemaker: She had previously been experiencing more frequent palpitations for which I increased long-acting diltiazem to 240 mg daily. This appears to have been reduced in the interim to 180 mg daily. Due to hypotension, I will reduce it further to 120 mg. I am not inclined to reintroduce metoprolol in the future should palpitations recur given her severe COPD. Device interrogation on 08/04/14 demonstrated normal device function with no high  ventricular rates. Continue warfarin. No bleeding complications noted.  2. Valvular heart disease: She was hospitalized for CHF. I will obtain the discharge summary. I will also obtain an echocardiogram to assess for interval progression of valve disease. There was very mild aortic stenosis by echo in 06/2013.  3. Carotid artery stenosis: Mild bilaterally in October 2013.  4. Chronic diastolic heart failure: Compensated today but appears to have been hospitalized over the weekend. Will obtain records and obtain an echocardiogram as well. 5. Tobacco abuse: Cessation counseling previously provided. 6. Type II diabetes: On Januvia.  7. COPD: On Proair, Spiriva, and Accolate with h/o tobacco abuse. 8. Chest pain in the context of CAD: No symptoms at present. As she has only had one episode in the past several months, I do not feel nuclear stress testing is warranted at this time. I will continue SL nitroglycerin to be used prn.  Dispo: f/u 6 weeks.   Kate Sable, M.D., F.A.C.C.

## 2014-10-02 ENCOUNTER — Other Ambulatory Visit: Payer: Self-pay | Admitting: Nurse Practitioner

## 2014-10-02 NOTE — Telephone Encounter (Signed)
LMOM to call.

## 2014-10-02 NOTE — Telephone Encounter (Signed)
PLEASE CALL PT. ITS OK TO TAKE PROTONIX BID.

## 2014-10-02 NOTE — Telephone Encounter (Signed)
Pt is aware.  

## 2014-10-04 ENCOUNTER — Telehealth: Payer: Self-pay | Admitting: Nurse Practitioner

## 2014-10-05 ENCOUNTER — Other Ambulatory Visit: Payer: Self-pay | Admitting: Nurse Practitioner

## 2014-10-05 ENCOUNTER — Encounter: Payer: Self-pay | Admitting: Cardiovascular Disease

## 2014-10-06 ENCOUNTER — Telehealth: Payer: Self-pay | Admitting: Physician Assistant

## 2014-10-06 NOTE — Telephone Encounter (Signed)
Pt called to discuss chest pain. She has had it for hours today. She tried Maalox and some milk. Her symptoms improved with this. She wanted to make sure she was OK.  Discussed the situation with her. Advised her no way to make sure this is not her heart without seeing her. Advised her the only way to see her is if she comes to the ER. She does not wish to come to the ER.   Advised her that if she feels this is her stomach and wants to continue treating herself at home, this is OK.   She prefers to continue GI meds at home and promises to come to the ER if symptoms worsen.   Rosaria Ferries, PA-C 10/06/2014 5:34 PM Bee[facility] [phone]

## 2014-10-08 ENCOUNTER — Telehealth: Payer: Self-pay | Admitting: *Deleted

## 2014-10-08 NOTE — Telephone Encounter (Signed)
Pt was at drug store and noted numbness in legs,resolved after she sat down.Was told to elevate legs when sitting,watch salt intake etc.States compression stockings too tight.I suggested she try OTC compression stockings and limit her outings to where she can sit down if legs begin to bother her.I also suggested since she is a diabetic to see her pcp.She agreed with dispo

## 2014-10-08 NOTE — Telephone Encounter (Signed)
PT LEGS HAVE BEEN GETTING NUMB

## 2014-10-09 ENCOUNTER — Telehealth: Payer: Self-pay | Admitting: Family Medicine

## 2014-10-09 ENCOUNTER — Ambulatory Visit (HOSPITAL_COMMUNITY)
Admission: RE | Admit: 2014-10-09 | Discharge: 2014-10-09 | Disposition: A | Discharge status: Home / Self Care | Payer: Commercial Managed Care - HMO | Source: Ambulatory Visit | Attending: Cardiovascular Disease | Admitting: Cardiovascular Disease

## 2014-10-09 DIAGNOSIS — I350 Nonrheumatic aortic (valve) stenosis: Secondary | ICD-10-CM | POA: Diagnosis not present

## 2014-10-09 DIAGNOSIS — E119 Type 2 diabetes mellitus without complications: Secondary | ICD-10-CM | POA: Diagnosis not present

## 2014-10-09 DIAGNOSIS — Z720 Tobacco use: Secondary | ICD-10-CM | POA: Insufficient documentation

## 2014-10-09 DIAGNOSIS — R011 Cardiac murmur, unspecified: Secondary | ICD-10-CM | POA: Diagnosis not present

## 2014-10-09 DIAGNOSIS — I5033 Acute on chronic diastolic (congestive) heart failure: Secondary | ICD-10-CM

## 2014-10-09 NOTE — Telephone Encounter (Signed)
Just watch it and see if gets better

## 2014-10-09 NOTE — Progress Notes (Signed)
*  PRELIMINARY RESULTS* Echocardiogram 2D Echocardiogram has been performed.  [name] 10/09/2014, 12:29 PM

## 2014-10-09 NOTE — Telephone Encounter (Signed)
Her legs keep getting numb and she wants to know what she should do

## 2014-10-10 NOTE — Telephone Encounter (Signed)
Pt notified  verbalizes understanding

## 2014-10-12 ENCOUNTER — Encounter: Payer: Self-pay | Admitting: Pharmacist

## 2014-10-12 ENCOUNTER — Ambulatory Visit (INDEPENDENT_AMBULATORY_CARE_PROVIDER_SITE_OTHER): Payer: Commercial Managed Care - HMO | Admitting: Pharmacist

## 2014-10-12 VITALS — BP 110/58 | HR 63 | Wt 147.0 lb

## 2014-10-12 DIAGNOSIS — Z23 Encounter for immunization: Secondary | ICD-10-CM

## 2014-10-12 DIAGNOSIS — I480 Paroxysmal atrial fibrillation: Secondary | ICD-10-CM

## 2014-10-12 DIAGNOSIS — Z0000 Encounter for general adult medical examination without abnormal findings: Secondary | ICD-10-CM | POA: Diagnosis not present

## 2014-10-12 LAB — POCT INR: INR: 3.1

## 2014-10-12 NOTE — Progress Notes (Signed)
Subjective:   [name] is a 80 y.o. female who presents for an Initial Medicare Annual Wellness Visit and recheck Protime  Current Medications (verified) Outpatient Encounter Prescriptions as of 10/12/2014  Medication Sig  . albuterol (VENTOLIN HFA) 108 (90 BASE) MCG/ACT inhaler Inhale 1 to 2 puffs into lung every 4 to 6 hours as needed for SOB / wheezing  . clonazePAM (KLONOPIN) 0.5 MG tablet Take 1 tablet (0.5 mg total) by mouth 3 (three) times daily as needed for anxiety.  [name] diltiazem (CARDIZEM) 120 MG tablet Take 1 tablet (120 mg total) by mouth daily.  . furosemide (LASIX) 40 MG tablet TAKE ONE TABLET BY MOUTH ONE TIME DAILY  . HYDROcodone-acetaminophen (NORCO/VICODIN) 5-325 MG per tablet Take 1 tablet by mouth 3 (three) times daily as needed for moderate pain.  [name] JANUVIA 50 MG tablet TAKE ONE TABLET BY MOUTH ONE TIME DAILY  . lipase/protease/amylase (CREON) 36000 UNITS CPEP capsule 3 PO WITH MEALS AND 1 WITH SNACK (Patient taking differently: 3 PO WITH MEALS AND 2 WITH SNACK)  . nitroGLYCERIN (NITROSTAT) 0.4 MG SL tablet Place 1 tablet (0.4 mg total) under the tongue every 5 (five) minutes as needed for chest pain.  . pantoprazole (PROTONIX) 40 MG tablet Take 1 tablet (40 mg total) by mouth 2 (two) times daily.  . Probiotic Product ([place]) CAPS Take 1 capsule by mouth daily.  [name] PROLIA 60 MG/ML SOLN injection INJECT 60 MG INTO THE SKIN ONCE. ADMINISTER IN UPPER ARM, THIGH, OR AB DOMEN  . simvastatin (ZOCOR) 10 MG tablet TAKE ONE TABLET BY MOUTH ONE  TIME DAILY  . SPIRIVA HANDIHALER 18 MCG inhalation capsule INHALE CONTENTS ONE CAPSULE BY MOUTH ONE TIME DAILY  . SYMBICORT 160-4.5 MCG/ACT inhaler INHALE 2 PUFFS INTO THE LUNGS 2 (TWO) TIMES DAILY.  [name] warfarin (COUMADIN) 2 MG tablet TAKE PER PHYSICIAN INSTRUCTIONS  . zafirlukast (ACCOLATE) 20 MG tablet TAKE ONE TABLET BY MOUTH TWICE DAILY  . bisacodyl (DULCOLAX) 5 MG EC tablet Take 5 mg by mouth daily as needed for  constipation.  . [DISCONTINUED] omeprazole (PRILOSEC) 40 MG capsule Take 40 mg by mouth daily.    Allergies (verified) Torsemide; Macrobid; and Tramadol   History: Past Medical History  Diagnosis Date  . Permanent atrial fibrillation     H/o tachybradycardia syndrome on Coumadin anticoagulation, has pacemaker.  [name] CAD (coronary artery disease) 2011    Catheterization August 2011: mild nonobstructive coronary artery disease complicated by large left rectus sheath hematoma status post evacuation Coumadin restarted without recurrence  . Diabetes mellitus   . Chronic airway obstruction, not elsewhere classified   . Anemia, unspecified   . Chronic kidney disease, stage II (mild)   . Unspecified hypertensive kidney disease with chronic kidney disease stage I through stage IV, or unspecified   . Tachycardia-bradycardia     s/p St. Jude single chamber PPM 10/2010   . GERD (gastroesophageal reflux disease)   . Abnormal CT of the chest     Mediastinal and hilar adenopathy followed by Dr. Koleen Nimrod in [place]  . Congestive heart failure, unspecified     EF now 60%, previous nonischemic cardiomyopathy  . Pacemaker     Single chamber [place]. Jude ACCENT [id number] SN 719 04/[birth year]/1959 10/31/2010  . Valvular heart disease     a. H/o moderate mitral stenosis by cath 2011 but no mention of this on 10/2011 echo. b. 10/2011 echo: mod MR, mild AS, mild TR; EF>65%  . Hypercholesterolemia   . Diverticulitis  Dec 2012  . Body mass index (BMI) of 20.0-20.9 in adult FEB 2013 147 LBS  . Recurrent UTI   . Neurogenic sleep apnea     Uses noctural oxygen prescribed by her pulmonologi  . Rheumatic heart disease   . Gastritis     By EGD  . Carotid artery disease     Doppler, 05/2012, 0-39% bilateral  . Vitamin D deficiency   . Dilated bile duct 10/19/2011  . Mitral regurgitation 06/27/2013  . Cataract    Past Surgical History  Procedure Laterality Date  . Single-chamber pacemaker implantation  3/12    by Dr Caryl Comes  .  Evacuation of retroperitoneal and left rectus sheath    . Cholecystectomy      40+ years ago  . Hernia repair      X3  . Pilonidal cyst / sinus excision    . Esophagogastroduodenoscopy  10/2011    Fields-erosive gastritis, biopsy with no H. pylori, hiatal hernia, peptic duodenitis, no celiac disease, duodenal diverticulum, duodenal nodule  . Colonoscopy  10/2011    Dyann Ruddle sigmoid to descending diverticulosis, internal hemorrhoids  . Eus  11/16/11    Mishra-13 MM CBD, normal pancreatic duct, otherwise normal EUS  . Hematoma evacuation      femoral   Family History  Problem Relation Age of Onset  . Cancer Mother     cervical  . Stroke Mother   . Cancer Daughter 18    kidney, lung  . Heart disease Father   . Heart attack Father   . Ulcers Father   . Colon cancer Neg Hx   . Early death Sister 1    died at 38 months old  . Hypertension Brother   . Cancer Paternal Aunt     breast  . Hypertension Son    Social History   Occupational History  . RETIRED    Social History Main Topics  . Smoking status: Current Every Day Smoker -- 0.25 packs/day for 45 years    Types: Cigarettes    Start date: 02/05/1958  . Smokeless tobacco: Never Used     Comment: smokes 6-7cigarettes daily  . Alcohol Use: No  . Drug Use: No  . Sexual Activity: Not Currently      Objective:    Today's Vitals   10/12/14 1426  BP: 110/58  Pulse: 63  Weight: 147 lb (66.679 kg)  PainSc: 0-No pain     Activities of Daily Living In your present state of health, do you have any difficulty performing the following activities: 10/12/2014 05/21/2014  Is the patient deaf or have difficulty hearing? N N  Hearing Y [employer] N  Difficulty concentrating or making decisions N N  Walking or climbing stairs? N N  Doing errands, shopping? N N  Preparing Food and eating ? N -  Using the Toilet? N -  In the past six months, have you accidently leaked urine? N -  Do you have problems with loss of bowel  control? N -  Managing your Medications? N -  Managing your Finances? N -  Housekeeping or managing your Housekeeping? N -    Are there smokers in your home (other than you)? No  Dietary issues and exercise activities discussed: Current Exercise Habits:: Home exercise routine, Type of exercise: walking, Time (Minutes): 10, Frequency (Times/Week): 3, Weekly Exercise (Minutes/Week): 30, Intensity: Mild  Depression Screen PHQ 2/9 Scores 10/12/2014 05/21/2014 02/13/2014 12/13/2013  PHQ - 2 Score 1 0 0 0  Fall Risk Fall Risk  10/12/2014 05/21/2014 02/13/2014 12/13/2013 09/14/2013  Falls in the past year? Yes No No No No  Number falls in past yr: 2 or more - - - -  Injury with Fall? Yes - - - -  Risk Factor Category  High Fall Risk - - - -  Risk for fall due to : Impaired balance/gait;History of fall(s);Medication side effect Impaired balance/gait;Impaired mobility - - -    Cognitive Function: MMSE - Mini Mental State Exam 10/12/2014  Orientation to time 5  Orientation to Place 5  Registration 3  Attention/ Calculation 3  Recall 2  Language- name 2 objects 2  Language- repeat 1  Language- follow 3 step command 1  Language- read & follow direction 1  Write a sentence 1  Copy design 1  Total score 25    Immunizations and Health Maintenance Immunization History  Administered Date(s) Administered  . Influenza Split 05/12/2012  . Influenza,inj,Quad PF,36+ Mos 05/11/2013, 06/21/2014  . Pneumococcal Conjugate-13 06/21/2014   Patient was sure that she received Tdap at [facility] in [facility] 02/03/2014 when she was seen for scratches from dog.  Checked [facility] and was not noted.  Called Urgent Care and verified with Felicity Coyer, LPN that Tdap was not given .  Checked price for Tdap for patient and would be $3.60.  Patiend declined Tdap today.   Health Maintenance Due  Topic Date Due  . DEXA SCAN  02/05/2002  . OPHTHALMOLOGY EXAM  03/23/2014  . URINE MICROALBUMIN  05/11/2014   Diabetic  eye exam done 03/23/2013 - no retinopathy  Glaucoma Testing done last 03/23/2013   Tonometry/Tonopen   OD = 11mmHg   OS = 62mmHg  INR was 3.1 today  Current dose is warfarin 2mg  tablets - take 1 tablet on wednesdays and 1/2 tablet or 1mg  all other days.  Patient has been taking ABX for last 2 weeks.   Patient Care Team: Chipper Herb, MD as PCP - General (Family Medicine) Daneil Dolin, MD (Gastroenterology) Thompson Grayer, MD as Consulting Physician (Cardiology) Marcille Buffy, DPM as Consulting Physician (Podiatry) Danie Binder, MD as Consulting Physician (Gastroenterology) Herminio Commons, MD as Attending Physician (Cardiology) Okey Regal, OD as Consulting Physician (Optometry)  Indicate any recent Medical Services you may have received from other than [facility] providers in the past year (date may be approximate).    Assessment:    Annual Wellness Visit    Screening Tests Health Maintenance  Topic Date Due  . DEXA SCAN  02/05/2002  . OPHTHALMOLOGY EXAM  03/23/2014  . URINE MICROALBUMIN  05/11/2014  . PNEUMOCOCCAL POLYSACCHARIDE VACCINE AGE 15 AND OVER  11/20/2014 (Originally 02/05/2002)  . ZOSTAVAX  11/20/2014 (Originally 02/05/1997)  . TETANUS/TDAP  11/20/2014 (Originally 02[birth year]06/1956)  . HEMOGLOBIN A1C  01/23/2015  . INFLUENZA VACCINE  03/11/2015  . FOOT EXAM  05/22/2015  . COLONOSCOPY  10/12/2021        Plan:   During the course of the visit [place] was educated and counseled about the following appropriate screening and preventive services:   Vaccines to include Pneumoccal, Influenza, Hepatitis B, Td, Zostavax  Gave Zostavax in office today  Colorectal cancer screening - UTD  Cardiovascular disease screening - UTD  Diabetes screening - UTD  Bone Denisty / Osteoporosis Screening - UTD  Mammogram - made appt for 11/06/14  PAP - no longer required  Glaucoma screening - requesting records  Smoking cessation counseling - discussed trigger avoidance.   Offered  pharmcotherapy - patient declined  Made appt for follow up with PCP  Anticoagulation Dose Instructions as of 10/12/2014      Dorene Grebe Tue Wed Thu Fri Sat   New Dose 1 mg 1 mg 1 mg 1 mg 1 mg 1 mg 1 mg    Description        Restart previous warfarin dose of 1/2 tablet every day.      Recheck INR in 1 week.  Patient Instructions (the written plan) were given to the patient.   Cherre Robins, [facility]   10/12/2014

## 2014-10-12 NOTE — Patient Instructions (Addendum)
Mammogram appointment Tuesday, March 29th at 1:45pm - [facility] mobile unit at [place]  Referral made to [facility] (lung doctor)   Anticoagulation Dose Instructions as of 10/12/2014      Dorene Grebe Tue Wed Thu Fri Sat   New Dose 1 _0  mg 1 mg    Description        Restart previous warfarin dose of 1/2 tablet every day.      INR was 3.1 today   Shingles Vaccine What You Need to Know WHAT IS SHINGLES?  Shingles is a painful skin rash, often with blisters. It is also called Herpes Zoster or just Zoster.  A shingles rash usually appears on one side of the face or body and lasts from 2 to 4 weeks. Its main symptom is pain, which can be quite severe. Other symptoms of shingles can include fever, headache, chills, and upset stomach. Very rarely, a shingles infection can lead to pneumonia, hearing problems, blindness, brain inflammation (encephalitis), or death.  For about 1 person in 5, severe pain can continue even after the rash clears up. This is called post-herpetic neuralgia.  Shingles is caused by the Varicella Zoster virus. This is the same virus that causes chickenpox. Only someone who has had a case of chickenpox or rarely, has gotten chickenpox vaccine, can get shingles. The virus stays in your body. It can reappear many years later to cause a case of shingles.  You cannot catch shingles from another person with shingles. However, a person who has never had chickenpox (or chickenpox vaccine) could get chickenpox from someone with shingles. This is not very common.  Shingles is far more common in people 57 and older than in younger people. It is also more common in people whose immune systems are weakened because of a disease such as cancer or drugs such as steroids or chemotherapy.  At least 1 million people get shingles per year in the [place]. SHINGLES VACCINE  A vaccine for shingles was licensed in 1610. In clinical  trials, the vaccine reduced the risk of shingles by 50%. It can also reduce the pain in people who still get shingles after being vaccinated.  A single dose of shingles vaccine is recommended for adults 73 years of age and older. SOME PEOPLE SHOULD NOT GET SHINGLES VACCINE OR SHOULD WAIT A person should not get shingles vaccine if he or she:  Has ever had a life-threatening allergic reaction to gelatin, the antibiotic neomycin, or any other component of shingles vaccine. Tell your caregiver if you have any severe allergies.  Has a weakened immune system because of current:  AIDS or another disease that affects the immune system.  Treatment with drugs that affect the immune system, such as prolonged use of high-dose steroids.  Cancer treatment, such as radiation or chemotherapy.  Cancer affecting the bone marrow or lymphatic system, such as leukemia or lymphoma.  Is pregnant, or might be pregnant. Women should not become pregnant until at least 4 weeks after getting shingles vaccine. Someone with a minor illness, such as a cold, may be vaccinated. Anyone with a moderate or severe acute illness should usually wait until he or she recovers before getting the vaccine. This includes anyone with a temperature of 101.3 F (38 C) or higher. WHAT ARE THE RISKS FROM SHINGLES VACCINE?  A vaccine, like any medicine, could possibly cause serious problems, such as severe allergic reactions. However, the risk  of a vaccine causing serious harm, or death, is extremely small.  No serious problems have been identified with shingles vaccine. Mild Problems  Redness, soreness, swelling, or itching at the site of the injection (about 1 person in 3).  Headache (about 1 person in 44). Like all vaccines, shingles vaccine is being closely monitored for unusual or severe problems. WHAT IF THERE IS A MODERATE OR SEVERE REACTION? What should I look for? Any unusual condition, such as a severe allergic reaction  or a high fever. If a severe allergic reaction occurred, it would be within a few minutes to an hour after the shot. Signs of a serious allergic reaction can include difficulty breathing, weakness, hoarseness or wheezing, a fast heartbeat, hives, dizziness, paleness, or swelling of the throat. What should I do?  Call your caregiver, or get the person to a caregiver right away.  Tell the caregiver what happened, the date and time it happened, and when the vaccination was given.  Ask the caregiver to report the reaction by filing a Vaccine Adverse Event Reporting System (VAERS) form. Or, you can file this report through the VAERS web site at www.vaers.SamedayNews.es or by calling 270-570-8759. VAERS does not provide medical advice. HOW CAN I LEARN MORE?  Ask your caregiver. He or she can give you the vaccine package insert or suggest other sources of information.  Contact the Centers for Disease Control and Prevention (CDC):  Call (873) 657-0588 (1-800-CDC-INFO).  Visit the CDC website at http://hunter.com/ CDC Shingles Vaccine VIS (05/15/08) Document Released: 05/24/2006 Document Revised: 10/19/2011 Document Reviewed: 11/16/2012 [facility] Patient Information 2015 [place]. This information is not intended to replace advice given to you by your health care provider. Make sure you discuss any questions you have with your health care provider.  Preventive Care for Adults A healthy lifestyle and preventive care can promote health and wellness. Preventive health guidelines for women include the following key practices.  A routine yearly physical is a good way to check with your health care provider about your health and preventive screening. It is a chance to share any concerns and updates on your health and to receive a thorough exam.  Visit your dentist for a routine exam and preventive care every 6 months. Brush your teeth twice a day and floss once a day. Good oral hygiene prevents tooth  decay and gum disease.  The frequency of eye exams is based on your age, health, family medical history, use of contact lenses, and other factors. Follow your health care provider's recommendations for frequency of eye exams.  Eat a healthy diet. Foods like vegetables, fruits, whole grains, low-fat dairy products, and lean protein foods contain the nutrients you need without too many calories. Decrease your intake of foods high in solid fats, added sugars, and salt. Eat the right amount of calories for you.Get information about a proper diet from your health care provider, if necessary.  Regular physical exercise is one of the most important things you can do for your health. Most adults should get at least 150 minutes of moderate-intensity exercise (any activity that increases your heart rate and causes you to sweat) each week. In addition, most adults need muscle-strengthening exercises on 2 or more days a week.  Maintain a healthy weight. The body mass index (BMI) is a screening tool to identify possible weight problems. It provides an estimate of body fat based on height and weight. Your health care provider can find your BMI and can help you achieve or  maintain a healthy weight.For adults 20 years and older:  A BMI below 18.5 is considered underweight.  A BMI of 18.5 to 24.9 is normal.  A BMI of 25 to 29.9 is considered overweight.  A BMI of 30 and above is considered obese.  Maintain normal blood lipids and cholesterol levels by exercising and minimizing your intake of saturated fat. Eat a balanced diet with plenty of fruit and vegetables. Blood tests for lipids and cholesterol should begin at age 30 and be repeated every 5 years. If your lipid or cholesterol levels are high, you are over 50, or you are at high risk for heart disease, you may need your cholesterol levels checked more frequently.Ongoing high lipid and cholesterol levels should be treated with medicines if diet and exercise  are not working.  If you smoke, find out from your health care provider how to quit. If you do not use tobacco, do not start.  Lung cancer screening is recommended for adults aged 18-80 years who are at high risk for developing lung cancer because of a history of smoking. A yearly low-dose CT scan of the lungs is recommended for people who have at least a 30-pack-year history of smoking and are a current smoker or have quit within the past 15 years. A pack year of smoking is smoking an average of 1 pack of cigarettes a day for 1 year (for example: 1 pack a day for 30 years or 2 packs a day for 15 years). Yearly screening should continue until the smoker has stopped smoking for at least 15 years. Yearly screening should be stopped for people who develop a health problem that would prevent them from having lung cancer treatment.  If you are pregnant, do not drink alcohol. If you are breastfeeding, be very cautious about drinking alcohol. If you are not pregnant and choose to drink alcohol, do not have more than 1 drink per day. One drink is considered to be 12 ounces (355 mL) of beer, 5 ounces (148 mL) of wine, or 1.5 ounces (44 mL) of liquor.  Avoid use of street drugs. Do not share needles with anyone. Ask for help if you need support or instructions about stopping the use of drugs.  High blood pressure causes heart disease and increases the risk of stroke. Your blood pressure should be checked at least every 1 to 2 years. Ongoing high blood pressure should be treated with medicines if weight loss and exercise do not work.  If you are 38-63 years old, ask your health care provider if you should take aspirin to prevent strokes.  Diabetes screening involves taking a blood sample to check your fasting blood sugar level. This should be done once every 3 years, after age 46, if you are within normal weight and without risk factors for diabetes. Testing should be considered at a younger age or be carried out  more frequently if you are overweight and have at least 1 risk factor for diabetes.  Breast cancer screening is essential preventive care for women. You should practice "breast self-awareness." This means understanding the normal appearance and feel of your breasts and may include breast self-examination. Any changes detected, no matter how small, should be reported to a health care provider. Women in their 56s and 30s should have a clinical breast exam (CBE) by a health care provider as part of a regular health exam every 1 to 3 years. After age 28, women should have a CBE every year. Starting at  age 30, women should consider having a mammogram (breast X-ray test) every year. Women who have a family history of breast cancer should talk to their health care provider about genetic screening. Women at a high risk of breast cancer should talk to their health care providers about having an MRI and a mammogram every year.  Breast cancer gene (BRCA)-related cancer risk assessment is recommended for women who have family members with BRCA-related cancers. BRCA-related cancers include breast, ovarian, tubal, and peritoneal cancers. Having family members with these cancers may be associated with an increased risk for harmful changes (mutations) in the breast cancer genes BRCA1 and BRCA2. Results of the assessment will determine the need for genetic counseling and BRCA1 and BRCA2 testing.  Routine pelvic exams to screen for cancer are no longer recommended for nonpregnant women who are considered low risk for cancer of the pelvic organs (ovaries, uterus, and vagina) and who do not have symptoms. Ask your health care provider if a screening pelvic exam is right for you.  If you have had past treatment for cervical cancer or a condition that could lead to cancer, you need Pap tests and screening for cancer for at least 20 years after your treatment. If Pap tests have been discontinued, your risk factors (such as having  a new sexual partner) need to be reassessed to determine if screening should be resumed. Some women have medical problems that increase the chance of getting cervical cancer. In these cases, your health care provider may recommend more frequent screening and Pap tests.  The HPV test is an additional test that may be used for cervical cancer screening. The HPV test looks for the virus that can cause the cell changes on the cervix. The cells collected during the Pap test can be tested for HPV. The HPV test could be used to screen women aged 74 years and older, and should be used in women of any age who have unclear Pap test results. After the age of 23, women should have HPV testing at the same frequency as a Pap test.  Colorectal cancer can be detected and often prevented. Most routine colorectal cancer screening begins at the age of 57 years and continues through age 53 years. However, your health care provider may recommend screening at an earlier age if you have risk factors for colon cancer. On a yearly basis, your health care provider may provide home test kits to check for hidden blood in the stool. Use of a small camera at the end of a tube, to directly examine the colon (sigmoidoscopy or colonoscopy), can detect the earliest forms of colorectal cancer. Talk to your health care provider about this at age 7, when routine screening begins. Direct exam of the colon should be repeated every 5-10 years through age 63 years, unless early forms of pre-cancerous polyps or small growths are found.  People who are at an increased risk for hepatitis B should be screened for this virus. You are considered at high risk for hepatitis B if:  You were born in a country where hepatitis B occurs often. Talk with your health care provider about which countries are considered high risk.  Your parents were born in a high-risk country and you have not received a shot to protect against hepatitis B (hepatitis B  vaccine).  You have HIV or AIDS.  You use needles to inject street drugs.  You live with, or have sex with, someone who has hepatitis B.  You get hemodialysis  treatment.  You take certain medicines for conditions like cancer, organ transplantation, and autoimmune conditions.  Hepatitis C blood testing is recommended for all people born from [birth year] through 1963 and any individual with known risks for hepatitis C.  Practice safe sex. Use condoms and avoid high-risk sexual practices to reduce the spread of sexually transmitted infections (STIs). STIs include gonorrhea, chlamydia, syphilis, trichomonas, herpes, HPV, and human immunodeficiency virus (HIV). Herpes, HIV, and HPV are viral illnesses that have no cure. They can result in disability, cancer, and death.  You should be screened for sexually transmitted illnesses (STIs) including gonorrhea and chlamydia if:  You are sexually active and are younger than 24 years.  You are older than 24 years and your health care provider tells you that you are at risk for this type of infection.  Your sexual activity has changed since you were last screened and you are at an increased risk for chlamydia or gonorrhea. Ask your health care provider if you are at risk.  If you are at risk of being infected with HIV, it is recommended that you take a prescription medicine daily to prevent HIV infection. This is called preexposure prophylaxis (PrEP). You are considered at risk if:  You are a heterosexual woman, are sexually active, and are at increased risk for HIV infection.  You take drugs by injection.  You are sexually active with a partner who has HIV.  Talk with your health care provider about whether you are at high risk of being infected with HIV. If you choose to begin PrEP, you should first be tested for HIV. You should then be tested every 3 months for as long as you are taking PrEP.  Osteoporosis is a disease in which the bones lose minerals  and strength with aging. This can result in serious bone fractures or breaks. The risk of osteoporosis can be identified using a bone density scan. Women ages 56 years and over and women at risk for fractures or osteoporosis should discuss screening with their health care providers. Ask your health care provider whether you should take a calcium supplement or vitamin D to reduce the rate of osteoporosis.  Menopause can be associated with physical symptoms and risks. Hormone replacement therapy is available to decrease symptoms and risks. You should talk to your health care provider about whether hormone replacement therapy is right for you.  Use sunscreen. Apply sunscreen liberally and repeatedly throughout the day. You should seek shade when your shadow is shorter than you. Protect yourself by wearing long sleeves, pants, a wide-brimmed hat, and sunglasses year round, whenever you are outdoors.  Once a month, do a whole body skin exam, using a mirror to look at the skin on your back. Tell your health care provider of new moles, moles that have irregular borders, moles that are larger than a pencil eraser, or moles that have changed in shape or color.  Stay current with required vaccines (immunizations).  Influenza vaccine. All adults should be immunized every year.  Tetanus, diphtheria, and acellular pertussis (Td, Tdap) vaccine. Pregnant women should receive 1 dose of Tdap vaccine during each pregnancy. The dose should be obtained regardless of the length of time since the last dose. Immunization is preferred during the 27th-36th week of gestation. An adult who has not previously received Tdap or who does not know her vaccine status should receive 1 dose of Tdap. This initial dose should be followed by tetanus and diphtheria toxoids (Td) booster doses every 10  years. Adults with an unknown or incomplete history of completing a 3-dose immunization series with Td-containing vaccines should begin or  complete a primary immunization series including a Tdap dose. Adults should receive a Td booster every 10 years.  Varicella vaccine. An adult without evidence of immunity to varicella should receive 2 doses or a second dose if she has previously received 1 dose. Pregnant females who do not have evidence of immunity should receive the first dose after pregnancy. This first dose should be obtained before leaving the health care facility. The second dose should be obtained 4-8 weeks after the first dose.  Human papillomavirus (HPV) vaccine. Females aged 13-26 years who have not received the vaccine previously should obtain the 3-dose series. The vaccine is not recommended for use in pregnant females. However, pregnancy testing is not needed before receiving a dose. If a female is found to be pregnant after receiving a dose, no treatment is needed. In that case, the remaining doses should be delayed until after the pregnancy. Immunization is recommended for any person with an immunocompromised condition through the age of 32 years if she did not get any or all doses earlier. During the 3-dose series, the second dose should be obtained 4-8 weeks after the first dose. The third dose should be obtained 24 weeks after the first dose and 16 weeks after the second dose.  Zoster vaccine. One dose is recommended for adults aged 76 years or older unless certain conditions are present.  Measles, mumps, and rubella (MMR) vaccine. Adults born before [birth year] generally are considered immune to measles and mumps. Adults born in [birth year] or later should have 1 or more doses of MMR vaccine unless there is a contraindication to the vaccine or there is laboratory evidence of immunity to each of the three diseases. A routine second dose of MMR vaccine should be obtained at least 28 days after the first dose for students attending postsecondary schools, health care workers, or international travelers. People who received inactivated  measles vaccine or an unknown type of measles vaccine during 1963-1967 should receive 2 doses of MMR vaccine. People who received inactivated mumps vaccine or an unknown type of mumps vaccine before 1979 and are at high risk for mumps infection should consider immunization with 2 doses of MMR vaccine. For females of childbearing age, rubella immunity should be determined. If there is no evidence of immunity, females who are not pregnant should be vaccinated. If there is no evidence of immunity, females who are pregnant should delay immunization until after pregnancy. Unvaccinated health care workers born before [birth year] who lack laboratory evidence of measles, mumps, or rubella immunity or laboratory confirmation of disease should consider measles and mumps immunization with 2 doses of MMR vaccine or rubella immunization with 1 dose of MMR vaccine.  Pneumococcal 13-valent conjugate (PCV13) vaccine. When indicated, a person who is uncertain of her immunization history and has no record of immunization should receive the PCV13 vaccine. An adult aged 59 years or older who has certain medical conditions and has not been previously immunized should receive 1 dose of PCV13 vaccine. This PCV13 should be followed with a dose of pneumococcal polysaccharide (PPSV23) vaccine. The PPSV23 vaccine dose should be obtained at least 8 weeks after the dose of PCV13 vaccine. An adult aged 67 years or older who has certain medical conditions and previously received 1 or more doses of PPSV23 vaccine should receive 1 dose of PCV13. The PCV13 vaccine dose should be obtained 1 or more  years after the last PPSV23 vaccine dose.  Pneumococcal polysaccharide (PPSV23) vaccine. When PCV13 is also indicated, PCV13 should be obtained first. All adults aged 55 years and older should be immunized. An adult younger than age 78 years who has certain medical conditions should be immunized. Any person who resides in a nursing home or long-term care  facility should be immunized. An adult smoker should be immunized. People with an immunocompromised condition and certain other conditions should receive both PCV13 and PPSV23 vaccines. People with human immunodeficiency virus (HIV) infection should be immunized as soon as possible after diagnosis. Immunization during chemotherapy or radiation therapy should be avoided. Routine use of PPSV23 vaccine is not recommended for American Indians, [place] Natives, or people younger than 65 years unless there are medical conditions that require PPSV23 vaccine. When indicated, people who have unknown immunization and have no record of immunization should receive PPSV23 vaccine. One-time revaccination 5 years after the first dose of PPSV23 is recommended for people aged 19-64 years who have chronic kidney failure, nephrotic syndrome, asplenia, or immunocompromised conditions. People who received 1-2 doses of PPSV23 before age 84 years should receive another dose of PPSV23 vaccine at age 69 years or later if at least 5 years have passed since the previous dose. Doses of PPSV23 are not needed for people immunized with PPSV23 at or after age 96 years.  Meningococcal vaccine. Adults with asplenia or persistent complement component deficiencies should receive 2 doses of quadrivalent meningococcal conjugate (MenACWY-D) vaccine. The doses should be obtained at least 2 months apart. Microbiologists working with certain meningococcal bacteria, [employer] recruits, people at risk during an outbreak, and people who travel to or live in countries with a high rate of meningitis should be immunized. A first-year college student up through age 83 years who is living in a residence hall should receive a dose if she did not receive a dose on or after her 16th birthday. Adults who have certain high-risk conditions should receive one or more doses of vaccine.  Hepatitis A vaccine. Adults who wish to be protected from this disease, have certain  high-risk conditions, work with hepatitis A-infected animals, work in hepatitis A research labs, or travel to or work in countries with a high rate of hepatitis A should be immunized. Adults who were previously unvaccinated and who anticipate close contact with an international adoptee during the first 60 days after arrival in the [place] from a country with a high rate of hepatitis A should be immunized.  Hepatitis B vaccine. Adults who wish to be protected from this disease, have certain high-risk conditions, may be exposed to blood or other infectious body fluids, are household contacts or sex partners of hepatitis B positive people, are clients or workers in certain care facilities, or travel to or work in countries with a high rate of hepatitis B should be immunized.  Haemophilus influenzae type b (Hib) vaccine. A previously unvaccinated person with asplenia or sickle cell disease or having a scheduled splenectomy should receive 1 dose of Hib vaccine. Regardless of previous immunization, a recipient of a hematopoietic stem cell transplant should receive a 3-dose series 6-12 months after her successful transplant. Hib vaccine is not recommended for adults with HIV infection. Preventive Services / Frequency Ages 78 years and over  Blood pressure check.** / Every 1 to 2 years.  Lipid and cholesterol check.** / Every 5 years beginning at age 74 years.  Lung cancer screening. / Every year if you are aged 21-80 years  and have a 30-pack-year history of smoking and currently smoke or have quit within the past 15 years. Yearly screening is stopped once you have quit smoking for at least 15 years or develop a health problem that would prevent you from having lung cancer treatment.  Clinical breast exam.** / Every year after age 53 years.  BRCA-related cancer risk assessment.** / For women who have family members with a BRCA-related cancer (breast, ovarian, tubal, or peritoneal  cancers).  Mammogram.** / Every year beginning at age 49 years and continuing for as long as you are in good health. Consult with your health care provider.  Pap test.** / Every 3 years starting at age 2 years through age 71 or 39 years with 3 consecutive normal Pap tests. Testing can be stopped between 65 and 70 years with 3 consecutive normal Pap tests and no abnormal Pap or HPV tests in the past 10 years.  HPV screening.** / Every 3 years from ages 34 years through ages 52 or 64 years with a history of 3 consecutive normal Pap tests. Testing can be stopped between 65 and 70 years with 3 consecutive normal Pap tests and no abnormal Pap or HPV tests in the past 10 years.  Fecal occult blood test (FOBT) of stool. / Every year beginning at age 78 years and continuing until age 56 years. You may not need to do this test if you get a colonoscopy every 10 years.  Flexible sigmoidoscopy or colonoscopy.** / Every 5 years for a flexible sigmoidoscopy or every 10 years for a colonoscopy beginning at age 75 years and continuing until age 12 years.  Hepatitis C blood test.** / For all people born from [birth year] through 1963 and any individual with known risks for hepatitis C.  Osteoporosis screening.** / A one-time screening for women ages 47 years and over and women at risk for fractures or osteoporosis.  Skin self-exam. / Monthly.  Influenza vaccine. / Every year.  Tetanus, diphtheria, and acellular pertussis (Tdap/Td) vaccine.** / 1 dose of Td every 10 years.  Varicella vaccine.** / Consult your health care provider.  Zoster vaccine.** / 1 dose for adults aged 44 years or older.  Pneumococcal 13-valent conjugate (PCV13) vaccine.** / Consult your health care provider.  Pneumococcal polysaccharide (PPSV23) vaccine.** / 1 dose for all adults aged 59 years and older.  Meningococcal vaccine.** / Consult your health care provider.  Hepatitis A vaccine.** / Consult your health care  provider.  Hepatitis B vaccine.** / Consult your health care provider.  Haemophilus influenzae type b (Hib) vaccine.** / Consult your health care provider. ** Family history and personal history of risk and conditions may change your health care provider's recommendations. Document Released: 09/22/2001 Document Revised: 12/11/2013 Document Reviewed: 12/22/2010 [facility] Patient Information 2015 [place], [place]. This information is not intended to replace advice given to you by your health care provider. Make sure you discuss any questions you have with your health care provider.   Fall Prevention and Home Safety Falls cause injuries and can affect all age groups. It is possible to use preventive measures to significantly decrease the likelihood of falls. There are many simple measures which can make your home safer and prevent falls. OUTDOORS  Repair cracks and edges of walkways and driveways.  Remove high doorway thresholds.  Trim shrubbery on the main path into your home.  Have good outside lighting.  Clear walkways of tools, rocks, debris, and clutter.  Check that handrails are not broken and are securely fastened.  Both sides of steps should have handrails.  Have leaves, snow, and ice cleared regularly.  Use sand or salt on walkways during winter months.  In the garage, clean up grease or oil spills. BATHROOM  Install night lights.  Install grab bars by the toilet and in the tub and shower.  Use non-skid mats or decals in the tub or shower.  Place a plastic non-slip stool in the shower to sit on, if needed.  Keep floors dry and clean up all water on the floor immediately.  Remove soap buildup in the tub or shower on a regular basis.  Secure bath mats with non-slip, double-sided rug tape.  Remove throw rugs and tripping hazards from the floors. BEDROOMS  Install night lights.  Make sure a bedside light is easy to reach.  Do not use oversized bedding.  Keep a  telephone by your bedside.  Have a firm chair with side arms to use for getting dressed.  Remove throw rugs and tripping hazards from the floor. KITCHEN  Keep handles on pots and pans turned toward the center of the stove. Use back burners when possible.  Clean up spills quickly and allow time for drying.  Avoid walking on wet floors.  Avoid hot utensils and knives.  Position shelves so they are not too high or low.  Place commonly used objects within easy reach.  If necessary, use a sturdy step stool with a grab bar when reaching.  Keep electrical cables out of the way.  Do not use floor polish or wax that makes floors slippery. If you must use wax, use non-skid floor wax.  Remove throw rugs and tripping hazards from the floor. STAIRWAYS  Never leave objects on stairs.  Place handrails on both sides of stairways and use them. Fix any loose handrails. Make sure handrails on both sides of the stairways are as long as the stairs.  Check carpeting to make sure it is firmly attached along stairs. Make repairs to worn or loose carpet promptly.  Avoid placing throw rugs at the top or bottom of stairways, or properly secure the rug with carpet tape to prevent slippage. Get rid of throw rugs, if possible.  Have an electrician put in a light switch at the top and bottom of the stairs. OTHER FALL PREVENTION TIPS  Wear low-heel or rubber-soled shoes that are supportive and fit well. Wear closed toe shoes.  When using a stepladder, make sure it is fully opened and both spreaders are firmly locked. Do not climb a closed stepladder.  Add color or contrast paint or tape to grab bars and handrails in your home. Place contrasting color strips on first and last steps.  Learn and use mobility aids as needed. Install an electrical emergency response system.  Turn on lights to avoid dark areas. Replace light bulbs that burn out immediately. Get light switches that glow.  Arrange furniture  to create clear pathways. Keep furniture in the same place.  Firmly attach carpet with non-skid or double-sided tape.  Eliminate uneven floor surfaces.  Select a carpet pattern that does not visually hide the edge of steps.  Be aware of all pets. OTHER HOME SAFETY TIPS  Set the water temperature for 120 F (48.8 C).  Keep emergency numbers on or near the telephone.  Keep smoke detectors on every level of the home and near sleeping areas. Document Released: 07/17/2002 Document Revised: 01/26/2012 Document Reviewed: 10/16/2011 [facility] Patient Information 2015 [place], [place]. This information is not intended  to replace advice given to you by your health care provider. Make sure you discuss any questions you have with your health care provider.

## 2014-10-15 ENCOUNTER — Telehealth: Payer: Self-pay

## 2014-10-15 NOTE — Telephone Encounter (Signed)
Pt called today and said that [facility] would have to talk with Dr. Bronson Ing @ [phone] about her hernia surgery.

## 2014-10-15 NOTE — Telephone Encounter (Signed)
PT NOT REFERRED FOR HERNIA SURGERY. ATTEMPTED TO CONTACT DR. Bronson Ing TO DISCUSS.

## 2014-10-16 ENCOUNTER — Other Ambulatory Visit: Payer: Self-pay

## 2014-10-16 DIAGNOSIS — F419 Anxiety disorder, unspecified: Secondary | ICD-10-CM

## 2014-10-16 MED ORDER — ALBUTEROL SULFATE HFA 108 (90 BASE) MCG/ACT IN AERS: INHALATION_SPRAY | RESPIRATORY_TRACT | Status: DC

## 2014-10-16 MED ORDER — BUDESONIDE-FORMOTEROL FUMARATE 160-4.5 MCG/ACT IN AERO: INHALATION_SPRAY | RESPIRATORY_TRACT | Status: DC

## 2014-10-16 MED ORDER — PANTOPRAZOLE SODIUM 40 MG PO TBEC: 40.0000 mg | DELAYED_RELEASE_TABLET | Freq: Two times a day (BID) | ORAL | Status: DC

## 2014-10-16 MED ORDER — DILTIAZEM HCL 120 MG PO TABS: 120.0000 mg | ORAL_TABLET | Freq: Every day | ORAL | Status: DC

## 2014-10-16 MED ORDER — ZAFIRLUKAST 20 MG PO TABS: 20.0000 mg | ORAL_TABLET | Freq: Two times a day (BID) | ORAL | Status: DC

## 2014-10-16 MED ORDER — TIOTROPIUM BROMIDE MONOHYDRATE 18 MCG IN CAPS: ORAL_CAPSULE | RESPIRATORY_TRACT | Status: DC

## 2014-10-16 MED ORDER — FUROSEMIDE 40 MG PO TABS: 40.0000 mg | ORAL_TABLET | Freq: Every day | ORAL | Status: DC

## 2014-10-16 MED ORDER — WARFARIN SODIUM 2 MG PO TABS: ORAL_TABLET | ORAL | Status: DC

## 2014-10-16 MED ORDER — SIMVASTATIN 10 MG PO TABS: ORAL_TABLET | ORAL | Status: DC

## 2014-10-16 NOTE — Telephone Encounter (Signed)
Last seen 10/12/14 [name]  If approved print and route to nurse for mail order

## 2014-10-16 NOTE — Telephone Encounter (Signed)
Please find out what medicines need to be filled and the that we'll be okay to refill

## 2014-10-19 ENCOUNTER — Encounter: Payer: Self-pay | Admitting: Nurse Practitioner

## 2014-10-19 ENCOUNTER — Ambulatory Visit (INDEPENDENT_AMBULATORY_CARE_PROVIDER_SITE_OTHER): Payer: Commercial Managed Care - HMO | Admitting: Nurse Practitioner

## 2014-10-19 VITALS — BP 107/70 | HR 81 | Temp 97.1°F | Ht 63.0 in | Wt 145.0 lb

## 2014-10-19 DIAGNOSIS — Z09 Encounter for follow-up examination after completed treatment for conditions other than malignant neoplasm: Secondary | ICD-10-CM

## 2014-10-19 DIAGNOSIS — I4891 Unspecified atrial fibrillation: Secondary | ICD-10-CM | POA: Diagnosis not present

## 2014-10-19 DIAGNOSIS — F419 Anxiety disorder, unspecified: Secondary | ICD-10-CM | POA: Diagnosis not present

## 2014-10-19 DIAGNOSIS — M810 Age-related osteoporosis without current pathological fracture: Secondary | ICD-10-CM | POA: Diagnosis not present

## 2014-10-19 LAB — POCT INR: INR: 2

## 2014-10-19 MED ORDER — HYDROCODONE-ACETAMINOPHEN 5-325 MG PO TABS: 1.0000 | ORAL_TABLET | Freq: Three times a day (TID) | ORAL | Status: DC | PRN

## 2014-10-19 MED ORDER — CLONAZEPAM 0.5 MG PO TABS: 0.5000 mg | ORAL_TABLET | Freq: Three times a day (TID) | ORAL | Status: DC | PRN

## 2014-10-19 NOTE — Patient Instructions (Signed)
Anticoagulation Dose Instructions as of 10/19/2014      Dorene Grebe Tue Wed Thu Fri Sat   New Dose 1 mg 1 mg 1 mg 1 mg 1 mg 2 mg 1 mg    Description        Continue 1/2 tablet daily except whole tablet on fridays      Recheck in 1 month

## 2014-10-19 NOTE — Addendum Note (Signed)
Addended by: [name] on: 10/19/2014 12:14 PM   Modules accepted: Orders

## 2014-10-19 NOTE — Progress Notes (Signed)
   Subjective:    Patient ID: [name], female    DOB: Aug 26, 1934, 80 y.o.   MRN: [id number]  HPI Patient is here today for hospital follow up- she went to the ER with heart palpitations. They kept her for 3 days on monitor- They seem to think that her blood sugars  Fluctuating are contributing to her palpitations. They changed alot of her meds. Her cardizem was decreased to 145m daily. She is to see cardiologist APril 11,2016. She is still having nervous spells and gets tired very easily.    Review of Systems  Constitutional: Negative.   HENT: Negative.   Respiratory: Negative.   Cardiovascular: Positive for palpitations.  Genitourinary: Negative.   Neurological: Negative.   Psychiatric/Behavioral: Negative.   All other systems reviewed and are negative.      Objective:   Physical Exam  Constitutional: She is oriented to person, place, and time. She appears well-developed and well-nourished.  Cardiovascular: Normal rate and regular rhythm.   Murmur (2/6) heard. Pulmonary/Chest: Effort normal and breath sounds normal.  Neurological: She is alert and oriented to person, place, and time.  Skin:  Circulatory changes bil lower ext.    BP 107/70 mmHg  Pulse 81  Temp(Src) 97.1 F (36.2 C) (Oral)  Ht _0  (1.6 m)  Wt 145 lb (65.772 kg)  BMI 25.69 kg/m2       Assessment & Plan:   1. Hospital discharge follow-up   2. Atrial fibrillation    INR discussed with patient- see flow sheet Rest  Continue all meds Follow up in 2 months and prn  Orders Placed This Encounter  Procedures  . [place] FNP

## 2014-10-20 LAB — CMP14+EGFR
ALBUMIN: 4.4 g/dL (ref 3.5–4.8)
ALT: 15 IU/L (ref 0–32)
AST: 18 IU/L (ref 0–40)
Albumin/Globulin Ratio: 1.8 (ref 1.1–2.5)
Alkaline Phosphatase: 65 IU/L (ref 39–117)
BILIRUBIN TOTAL: 0.4 mg/dL (ref 0.0–1.2)
BUN / CREAT RATIO: 16 (ref 11–26)
BUN: 18 mg/dL (ref 8–27)
CO2: 23 mmol/L (ref 18–29)
CREATININE: 1.14 mg/dL — AB (ref 0.57–1.00)
Calcium: 9.6 mg/dL (ref 8.7–10.3)
Chloride: 99 mmol/L (ref 97–108)
GFR calc non Af Amer: 46 mL/min/{1.73_m2} — ABNORMAL LOW (ref >59)
GFR, EST AFRICAN AMERICAN: 54 mL/min/{1.73_m2} — AB (ref >59)
GLUCOSE: 106 mg/dL — AB (ref 65–99)
Globulin, Total: 2.4 g/dL (ref 1.5–4.5)
Potassium: 4.8 mmol/L (ref 3.5–5.2)
SODIUM: 139 mmol/L (ref 134–144)
TOTAL PROTEIN: 6.8 g/dL (ref 6.0–8.5)

## 2014-10-22 ENCOUNTER — Telehealth: Payer: Self-pay | Admitting: Nurse Practitioner

## 2014-10-22 ENCOUNTER — Telehealth: Payer: Self-pay | Admitting: Gastroenterology

## 2014-10-22 NOTE — Telephone Encounter (Signed)
Pt said last time she was here Dr. Oneida Alar said she needed to see surgeon for the hernia. She said she told the cardiologist and he would like to take to Dr. Oneida Alar before it is scheduled. She said she prefers [place] to [place] to make it easier for the children to come to see her. She is still having a lot of indigestion regardless of what she eats and she stays weak a lot. She is aware that DR. Fields is off today.

## 2014-10-22 NOTE — Telephone Encounter (Signed)
PATIENT CALLED WANTING TO TALK ABOUT GETTING HER HERNIA SURGERY. HER CARDIOLOGIST WAS SUPPOSED TO BE CLEARING HER FOR THE PROCEDURE AND SHE WAS WONDERING IF DR FIELDS HAD SPOKEN TO HIM [place],  [place]

## 2014-10-23 ENCOUNTER — Telehealth: Payer: Self-pay | Admitting: Family Medicine

## 2014-10-23 ENCOUNTER — Other Ambulatory Visit: Payer: Self-pay | Admitting: *Deleted

## 2014-10-23 ENCOUNTER — Other Ambulatory Visit: Payer: Self-pay

## 2014-10-23 MED ORDER — TRUE METRIX LEVEL 1 LOW VI SOLN: Status: DC

## 2014-10-23 MED ORDER — PANCRELIPASE (LIP-PROT-AMYL) 36000-114000 UNITS PO CPEP: ORAL_CAPSULE | ORAL | Status: DC

## 2014-10-23 MED ORDER — BD SWAB SINGLE USE REGULAR PADS
MEDICATED_PAD | Status: DC
Start: 2014-10-23 — End: 2014-12-27

## 2014-10-24 NOTE — Telephone Encounter (Signed)
PLEASE CALL PT. I REVIEWED MY NOTES FROM SEP 2015 TO PRESENT. MY NOTES SAY NOTHING ABOUT HERNIA SURGERY. HOWEVER IF SHE THINKS SHE HAS A HERNIA, SHE NEEDS TO BE EVALUATED BY A SURGEON.  I WILL MAKE THE REFERRAL TO [facility] IN [place]. I SPOKE WITH DR. Bronson Ing  AND WE BOTH AGREE SHE SHOULD SEE SURGERY FIRST THEN IF SHE NEEDS HERNIA SURGERY THEN SHE CAN BE ASSESSED FOR CARDIAC CLEARANCE.

## 2014-10-24 NOTE — Telephone Encounter (Signed)
Pt is aware.OK to refer to [facility] for consult.

## 2014-10-25 ENCOUNTER — Telehealth: Payer: Self-pay | Admitting: Nurse Practitioner

## 2014-10-25 DIAGNOSIS — J410 Simple chronic bronchitis: Secondary | ICD-10-CM

## 2014-10-25 NOTE — Telephone Encounter (Signed)
Pt has appointment at [facility] on 10/30/14 @ 3:20

## 2014-10-25 NOTE — Telephone Encounter (Signed)
Pt is aware.  

## 2014-10-26 NOTE — Telephone Encounter (Signed)
Referral made 

## 2014-10-26 NOTE — Telephone Encounter (Signed)
Detailed message left that referral has been made

## 2014-10-30 ENCOUNTER — Encounter: Payer: Commercial Managed Care - HMO | Admitting: *Deleted

## 2014-10-30 ENCOUNTER — Telehealth: Payer: Self-pay | Admitting: Cardiology

## 2014-10-30 ENCOUNTER — Telehealth: Payer: Self-pay | Admitting: Nurse Practitioner

## 2014-10-30 DIAGNOSIS — K432 Incisional hernia without obstruction or gangrene: Secondary | ICD-10-CM | POA: Diagnosis not present

## 2014-10-30 NOTE — Telephone Encounter (Signed)
Spoke with pt and reminded pt of remote transmission that is due today. Pt verbalized understanding.   

## 2014-10-30 NOTE — Telephone Encounter (Signed)
[facility] stating referral thru [facility] had been made.

## 2014-10-31 ENCOUNTER — Encounter: Payer: Self-pay | Admitting: Cardiology

## 2014-11-02 ENCOUNTER — Other Ambulatory Visit: Payer: Self-pay | Admitting: Nurse Practitioner

## 2014-11-02 ENCOUNTER — Other Ambulatory Visit: Payer: Self-pay | Admitting: Family Medicine

## 2014-11-05 ENCOUNTER — Other Ambulatory Visit: Payer: Self-pay | Admitting: Nurse Practitioner

## 2014-11-06 DIAGNOSIS — Z1231 Encounter for screening mammogram for malignant neoplasm of breast: Secondary | ICD-10-CM | POA: Diagnosis not present

## 2014-11-07 ENCOUNTER — Other Ambulatory Visit: Payer: Self-pay | Admitting: *Deleted

## 2014-11-07 MED ORDER — TIOTROPIUM BROMIDE MONOHYDRATE 18 MCG IN CAPS: ORAL_CAPSULE | RESPIRATORY_TRACT | Status: DC

## 2014-11-09 ENCOUNTER — Ambulatory Visit: Payer: Self-pay

## 2014-11-14 ENCOUNTER — Other Ambulatory Visit: Payer: Self-pay | Admitting: Family Medicine

## 2014-11-16 ENCOUNTER — Telehealth: Payer: Self-pay | Admitting: Cardiovascular Disease

## 2014-11-16 ENCOUNTER — Ambulatory Visit (INDEPENDENT_AMBULATORY_CARE_PROVIDER_SITE_OTHER): Payer: Commercial Managed Care - HMO | Admitting: *Deleted

## 2014-11-16 ENCOUNTER — Other Ambulatory Visit: Payer: Self-pay | Admitting: Family Medicine

## 2014-11-16 DIAGNOSIS — I495 Sick sinus syndrome: Secondary | ICD-10-CM | POA: Diagnosis not present

## 2014-11-16 DIAGNOSIS — M810 Age-related osteoporosis without current pathological fracture: Secondary | ICD-10-CM

## 2014-11-16 DIAGNOSIS — F419 Anxiety disorder, unspecified: Secondary | ICD-10-CM

## 2014-11-16 MED ORDER — CLONAZEPAM 0.5 MG PO TABS: 0.5000 mg | ORAL_TABLET | Freq: Three times a day (TID) | ORAL | Status: DC | PRN

## 2014-11-16 MED ORDER — HYDROCODONE-ACETAMINOPHEN 5-325 MG PO TABS: 1.0000 | ORAL_TABLET | Freq: Three times a day (TID) | ORAL | Status: DC | PRN

## 2014-11-16 NOTE — Telephone Encounter (Signed)
Pt reassured she can take inhaler pulmonologist gave her

## 2014-11-16 NOTE — Telephone Encounter (Signed)
rx ready for pickup 

## 2014-11-16 NOTE — Telephone Encounter (Signed)
Patient has concerns about inhaler that was prescribed by PCP affecting her heart medications. / tg

## 2014-11-17 NOTE — Telephone Encounter (Signed)
Detailed message left that rx is ready to be picked up.  

## 2014-11-19 ENCOUNTER — Ambulatory Visit (INDEPENDENT_AMBULATORY_CARE_PROVIDER_SITE_OTHER): Payer: Commercial Managed Care - HMO | Admitting: Cardiovascular Disease

## 2014-11-19 ENCOUNTER — Other Ambulatory Visit: Payer: Self-pay | Admitting: *Deleted

## 2014-11-19 ENCOUNTER — Encounter: Payer: Self-pay | Admitting: Cardiovascular Disease

## 2014-11-19 VITALS — BP 116/60 | HR 91 | Ht 63.0 in | Wt 146.0 lb

## 2014-11-19 DIAGNOSIS — I5032 Chronic diastolic (congestive) heart failure: Secondary | ICD-10-CM | POA: Diagnosis not present

## 2014-11-19 DIAGNOSIS — I482 Chronic atrial fibrillation, unspecified: Secondary | ICD-10-CM

## 2014-11-19 DIAGNOSIS — I495 Sick sinus syndrome: Secondary | ICD-10-CM

## 2014-11-19 DIAGNOSIS — I080 Rheumatic disorders of both mitral and aortic valves: Secondary | ICD-10-CM

## 2014-11-19 DIAGNOSIS — Z720 Tobacco use: Secondary | ICD-10-CM

## 2014-11-19 DIAGNOSIS — R002 Palpitations: Secondary | ICD-10-CM

## 2014-11-19 DIAGNOSIS — I779 Disorder of arteries and arterioles, unspecified: Secondary | ICD-10-CM

## 2014-11-19 DIAGNOSIS — I739 Peripheral vascular disease, unspecified: Secondary | ICD-10-CM

## 2014-11-19 DIAGNOSIS — I350 Nonrheumatic aortic (valve) stenosis: Secondary | ICD-10-CM

## 2014-11-19 DIAGNOSIS — Z950 Presence of cardiac pacemaker: Secondary | ICD-10-CM

## 2014-11-19 DIAGNOSIS — I25118 Atherosclerotic heart disease of native coronary artery with other forms of angina pectoris: Secondary | ICD-10-CM

## 2014-11-19 MED ORDER — DILTIAZEM HCL 120 MG PO TABS: 120.0000 mg | ORAL_TABLET | Freq: Every day | ORAL | Status: DC

## 2014-11-19 MED ORDER — FUROSEMIDE 40 MG PO TABS: 40.0000 mg | ORAL_TABLET | Freq: Every day | ORAL | Status: DC

## 2014-11-19 MED ORDER — SITAGLIPTIN PHOSPHATE 50 MG PO TABS: 50.0000 mg | ORAL_TABLET | Freq: Every day | ORAL | Status: DC

## 2014-11-19 MED ORDER — PANTOPRAZOLE SODIUM 40 MG PO TBEC: 40.0000 mg | DELAYED_RELEASE_TABLET | Freq: Two times a day (BID) | ORAL | Status: DC

## 2014-11-19 NOTE — Patient Instructions (Signed)
Your physician wants you to follow-up in: 6 months with Jory Sims NP You will receive a reminder letter in the mail two months in advance. If you don't receive a letter, please call our office to schedule the follow-up appointment.   Your physician recommends that you continue on your current medications as directed. Please refer to the Current Medication list given to you today.    Thank you for choosing [facility] !

## 2014-11-19 NOTE — Addendum Note (Signed)
Addended by: Ilean China on: 11/19/2014 10:27 AM   Modules accepted: Orders

## 2014-11-19 NOTE — Progress Notes (Signed)
Patient ID: [name], female   DOB: 13-Apr-1935, 80 y.o.   MRN: [id number]      SUBJECTIVE: The patient is here for routine cardiovascular follow up. She has COPD, chronic diastolic heart failure, permanent atrial fibrillation/flutter and has a pacemaker. Device interrogation on 08/04/14 demonstrated normal device function with no high ventricular rates. She was previoulsy taken off of metoprolol by Dr. Rayann Heman given her significant COPD with wheezing.  She also has a h/o mild nonobstructive CAD and a previous tachycardia-induced cardiomyopathy which has since normalized in function. Coronary angiography on 04/04/2010 demonstrated a 40% proximal LAD lesion, 20% mid left circumflex coronary artery lesion, and the RCA had a 20% proximal, 50-60% mid vessel, and distal 30% stenosis.  Echocardiogram on 10/09/14 demonstrated normal left ventricular systolic function, EF [id number], diastolic dysfunction, high filling pressures, mild LVH, mild aortic stenosis, mild mitral regurgitation, severe left atrial enlargement, moderate right atrial enlargement, and mildly reduced right ventricular systolic function with mild tricuspid regurgitation and mildly elevated pulmonary pressures.  She once had some mild right infra-axillary pain which resolved with pain medications. She feels that her heart races if she walks for any significant distance. She is scheduled to see a pulmonologist tomorrow. She did not tolerate higher doses of diltiazem as it led to low blood pressures.    Review of Systems: As per "subjective", otherwise negative.  Allergies  Allergen Reactions  . Torsemide Nausea And Vomiting  . Macrobid [Nitrofurantoin Monohyd Macro] Rash  . Tramadol Nausea Only    Current Outpatient Prescriptions  Medication Sig Dispense Refill  . albuterol (VENTOLIN HFA) 108 (90 BASE) MCG/ACT inhaler Inhale 1 to 2 puffs into lung every 4 to 6 hours as needed for SOB / wheezing 3 Inhaler 1  . Alcohol Swabs  (B-D SINGLE USE SWABS REGULAR) PADS Use two times daily 300 each 1  . bisacodyl (DULCOLAX) 5 MG EC tablet Take 5 mg by mouth daily as needed for constipation.    . Blood Glucose Calibration (TRUE METRIX LEVEL 1) LOW SOLN Use weekly 3 each 1  . clonazePAM (KLONOPIN) 0.5 MG tablet Take 1 tablet (0.5 mg total) by mouth 3 (three) times daily as needed for anxiety. 90 tablet 0  . diltiazem (CARDIZEM) 120 MG tablet Take 1 tablet (120 mg total) by mouth daily. 90 tablet 1  . furosemide (LASIX) 40 MG tablet Take 1 tablet (40 mg total) by mouth daily. 90 tablet 1  . HYDROcodone-acetaminophen (NORCO/VICODIN) 5-325 MG per tablet Take 1 tablet by mouth 3 (three) times daily as needed for moderate pain. 90 tablet 0  . ipratropium-albuterol (DUONEB) 0.5-2.5 (3) MG/3ML SOLN Take 3 mLs by nebulization every 6 (six) hours as needed.    . lipase/protease/amylase (CREON) 36000 UNITS CPEP capsule 3 PO WITH MEALS AND 1 WITH SNACK 540 capsule 1  . nitroGLYCERIN (NITROSTAT) 0.4 MG SL tablet Place 1 tablet (0.4 mg total) under the tongue every 5 (five) minutes as needed for chest pain. 25 tablet 3  . pantoprazole (PROTONIX) 40 MG tablet Take 1 tablet (40 mg total) by mouth 2 (two) times daily. 180 tablet 1  . potassium chloride SA (K-DUR,KLOR-CON) 20 MEQ tablet     . Probiotic Product ([place]) CAPS Take 1 capsule by mouth daily.    [name] PROLIA 60 MG/ML SOLN injection INJECT 60 MG INTO THE SKIN ONCE. ADMINISTER IN UPPER ARM, THIGH, OR AB DOMEN 1 mL 2  . simvastatin (ZOCOR) 10 MG tablet TAKE ONE TABLET BY MOUTH ONE TIME  DAILY 30 tablet 3  . sitaGLIPtin (JANUVIA) 50 MG tablet Take 1 tablet (50 mg total) by mouth daily. 90 tablet 0  . SYMBICORT 160-4.5 MCG/ACT inhaler INHALE 2 PUFFS INTO THE LUNGS 2 (TWO) TIMES DAILY. 11 g 3  . tiotropium (SPIRIVA HANDIHALER) 18 MCG inhalation capsule INHALE CONTENTS ONE CAPSULE BY MOUTH ONE TIME DAILY 90 capsule 1  . warfarin (COUMADIN) 2 MG tablet TAKE PER PHYSICIAN INSTRUCTIONS  135 tablet 1  . zafirlukast (ACCOLATE) 20 MG tablet Take 1 tablet (20 mg total) by mouth 2 (two) times daily. 180 tablet 1   Current Facility-Administered Medications  Medication Dose Route Frequency Provider Last Rate Last Dose  . levalbuterol (XOPENEX) nebulizer solution 1.25 mg  1.25 mg Nebulization Once Lysbeth Penner, FNP   1.25 mg at 08/07/14 1815    Past Medical History  Diagnosis Date  . Permanent atrial fibrillation     H/o tachybradycardia syndrome on Coumadin anticoagulation, has pacemaker.  [name] CAD (coronary artery disease) 2011    Catheterization August 2011: mild nonobstructive coronary artery disease complicated by large left rectus sheath hematoma status post evacuation Coumadin restarted without recurrence  . Diabetes mellitus   . Chronic airway obstruction, not elsewhere classified   . Anemia, unspecified   . Chronic kidney disease, stage II (mild)   . Unspecified hypertensive kidney disease with chronic kidney disease stage I through stage IV, or unspecified   . Tachycardia-bradycardia     s/p St. Jude single chamber PPM 10/2010   . GERD (gastroesophageal reflux disease)   . Abnormal CT of the chest     Mediastinal and hilar adenopathy followed by Dr. Koleen Nimrod in [place]  . Congestive heart failure, unspecified     EF now 60%, previous nonischemic cardiomyopathy  . Pacemaker     Single chamber [place]. Jude ACCENT [phone] SN 719 04/11/1959 10/31/2010  . Valvular heart disease     a. H/o moderate mitral stenosis by cath 2011 but no mention of this on 10/2011 echo. b. 10/2011 echo: mod MR, mild AS, mild TR; EF>65%  . Hypercholesterolemia   . Diverticulitis Dec 2012  . Body mass index (BMI) of 20.0-20.9 in adult FEB 2013 147 LBS  . Recurrent UTI   . Neurogenic sleep apnea     Uses noctural oxygen prescribed by her pulmonologi  . Rheumatic heart disease   . Gastritis     By EGD  . Carotid artery disease     Doppler, 05/2012, 0-39% bilateral  . Vitamin D deficiency   .  Dilated bile duct 10/19/2011  . Mitral regurgitation 06/27/2013  . Cataract     Past Surgical History  Procedure Laterality Date  . Single-chamber pacemaker implantation  3/12    by Dr Caryl Comes  . Evacuation of retroperitoneal and left rectus sheath    . Cholecystectomy      40+ years ago  . Hernia repair      X3  . Pilonidal cyst / sinus excision    . Esophagogastroduodenoscopy  10/2011    Fields-erosive gastritis, biopsy with no H. pylori, hiatal hernia, peptic duodenitis, no celiac disease, duodenal diverticulum, duodenal nodule  . Colonoscopy  10/2011    Dyann Ruddle sigmoid to descending diverticulosis, internal hemorrhoids  . Eus  11/16/11    Mishra-13 MM CBD, normal pancreatic duct, otherwise normal EUS  . Hematoma evacuation      femoral    History   Social History  . Marital Status: Single    Spouse Name: N/A  .  Number of Children: 2  . Years of Education: N/A   Occupational History  . RETIRED    Social History Main Topics  . Smoking status: Current Every Day Smoker -- 0.25 packs/day for 45 years    Types: Cigarettes    Start date: 02/05/1958  . Smokeless tobacco: Never Used     Comment: smokes 6-7cigarettes daily  . Alcohol Use: No  . Drug Use: No  . Sexual Activity: Not Currently   Other Topics Concern  . Not on file   Social History Narrative     Filed Vitals:   11/19/14 1142  BP: 116/60  Pulse: 91  Height: 5\' 3"  (1.6 m)  Weight: 146 lb (66.225 kg)  SpO2: 99%    PHYSICAL EXAM General: NAD HEENT: Poor dentition. Plaque noted on tongue. Neck: No JVD, no thyromegaly. Lungs: Prolonged expiratory phase with end-expiratory wheezes, no rales, poor air entry. CV: Nondisplaced PMI. Regular rate and rhythm, normal S1/S2, no S3/S4, II/VI ejection systolic murmur heard at RUSB and LUSB. No pretibial or periankle edema. Chronic stasis dermatitis. Abdomen: Soft, nontender, no distention.  Neurologic: Alert and oriented.  Psych: Normal affect. Skin:  Normal. Musculoskeletal: No gross deformities. Extremities: No clubbing or cyanosis.   ECG: Most recent ECG reviewed.      ASSESSMENT AND PLAN: 1. Palpitations with permanent atrial fibrillation s/p pacemaker: Continue long-acting diltiazem 120 mg. I am not inclined to reintroduce metoprolol in the future should palpitations recur given her severe COPD. Device interrogation on 08/04/14 demonstrated normal device function with no high ventricular rates. Continue warfarin. No bleeding complications noted.   2. Valvular heart disease: Mild aortic stenosis and mitral and tricuspid regurgitation as noted above. Will monitor.  3. Carotid artery stenosis: Mild bilaterally in October 2013.   4. Chronic diastolic heart failure: Euvolemic. No changes to therapy.  5. Tobacco abuse: Cessation counseling previously provided.  6. Type II diabetes: On Januvia.   7. COPD: On Proair, Spiriva, and Accolate with h/o tobacco abuse. Scheduled to see pulmonary tomorrow.  8. Chest pain in the context of CAD: No symptoms at present. As she has only had one episode in the past several months, I do not feel nuclear stress testing is warranted at this time. I will continue SL nitroglycerin to be used prn.  Dispo: f/u 6 months.  Kate Sable, M.D., F.A.C.C.

## 2014-11-20 ENCOUNTER — Ambulatory Visit (INDEPENDENT_AMBULATORY_CARE_PROVIDER_SITE_OTHER): Payer: Commercial Managed Care - HMO | Admitting: Internal Medicine

## 2014-11-20 ENCOUNTER — Encounter: Payer: Self-pay | Admitting: Internal Medicine

## 2014-11-20 VITALS — BP 112/60 | HR 71 | Ht 63.0 in | Wt 148.0 lb

## 2014-11-20 DIAGNOSIS — Z720 Tobacco use: Secondary | ICD-10-CM

## 2014-11-20 DIAGNOSIS — J449 Chronic obstructive pulmonary disease, unspecified: Secondary | ICD-10-CM

## 2014-11-20 DIAGNOSIS — F17210 Nicotine dependence, cigarettes, uncomplicated: Secondary | ICD-10-CM

## 2014-11-20 MED ORDER — PREDNISONE 10 MG PO TABS: ORAL_TABLET | ORAL | Status: DC

## 2014-11-20 NOTE — Patient Instructions (Addendum)
Prednisone 10 mg take  4 each am x 2 days,   2 each am x 2 days,  1 each am x 2 days and stop   Work on inhaler technique:  relax and gently blow all the way out then take a nice smooth deep breath back in, triggering the inhaler at same time you start breathing in.  Hold for up to 5 seconds if you can.  Rinse and gargle with water when done  If we can't help you learn how to use your inhalers we will find another way to given them to you  In the meantime  The key is to stop smoking completely before smoking completely stops you!   See Tammy NP w/in 2 weeks with all your medications, even over the counter meds, separated in two separate bags, the ones you take no matter what vs the ones you stop once you feel better and take only as needed when you feel you need them.   Tammy  will generate for you a new user friendly medication calendar that will put [place] all on the same page re: your medication use.    Late add May need to change completely to dpis or nebs on return if can't do better with hfa  follow up with me with full pfts

## 2014-11-20 NOTE — Progress Notes (Addendum)
Subjective:     Patient ID: [name], female   DOB: 05/15/1935, MRN: [id number]  HPI  52 yowf active smoker with variable sob x around 2012 eval by Koleen Nimrod dx copd referred by [name] for cough > sob    11/20/2014 1st [facility] office visit/ Wert  / on spiriva /symb/ multiple forms of sab  Chief Complaint  Patient presents with  . Advice Only    chronic bronchitis; cough; wheezing;    variably sob x 4 y maint on symb/ spiriva/ much better p prednisone in past but raises sugars but even then "best days" are gradually declining in terms of what she can tolerate  Uses 02 hs only x 4 y  On best days can still do mailbox and back then stops on return to use her saba and catch her breath but note hfa = 0% effective technique at ov (see a/p)   No obvious day to day or daytime variabilty or assoc excess or purulent sputum cp or chest tightness, subjective wheeze overt sinus or hb symptoms. No unusual exp hx or h/o childhood pna/ asthma or knowledge of premature birth.  Sleeping ok without nocturnal  or early am exacerbation  of respiratory  c/o's or need for noct saba. Also denies any obvious fluctuation of symptoms with weather or environmental changes or other aggravating or alleviating factors except as outlined above   Current Medications, Allergies, Complete Past Medical History, Past Surgical History, Family History, and Social History were reviewed in [employer] record.  ROS  The following are not active complaints unless bolded sore throat, dysphagia, dental problems, itching, sneezing,  nasal congestion or excess/ purulent secretions, ear ache,   fever, chills, sweats, unintended wt loss, pleuritic or exertional cp, hemoptysis,  orthopnea pnd or leg swelling, presyncope, palpitations, heartburn, abdominal pain, anorexia, nausea, vomiting, diarrhea  or change in bowel or urinary habits, change in stools or urine, dysuria,hematuria,  rash,  arthralgias, visual complaints, headache, numbness weakness or ataxia or problems with walking or coordination,  change in mood/affect or memory.        Review of Systems     Objective:   Physical Exam endentulous wf nad  Wt Readings from Last 3 Encounters:  11/20/14 148 lb (67.132 kg)  11/19/14 146 lb (66.225 kg)  10/19/14 145 lb (65.772 kg)    Vital signs reviewed   HEENT mild turbinate edema.  Oropharynx no thrush or excess pnd or cobblestoning.  No JVD or cervical adenopathy. Mild accessory muscle hypertrophy. Trachea midline, nl thryroid. Chest was hyperinflated by percussion with diminished breath sounds and moderate increased exp time without wheeze. Hoover sign positive at mid inspiration. Regular rate and rhythm without murmur gallop or rub or increase P2 or edema.  Abd: no hsm, nl excursion. Ext warm without cyanosis or clubbing.    CT chest 09/29/14 report reviewed Small bilateral effusions, L > R / mild edema/ and unclerly copd and dilation of LV      Assessment:

## 2014-11-21 NOTE — Assessment & Plan Note (Addendum)
-   02 at hs since 2012 - still smoking  DDX of  difficult airways management all start with A and  include Adherence, Ace Inhibitors, Acid Reflux, Active Sinus Disease, Alpha 1 Antitripsin deficiency, Anxiety masquerading as Airways dz,  ABPA,  allergy(esp in young), Aspiration (esp in elderly), Adverse effects of DPI,  Active smokers, plus two Bs  = Bronchiectasis and Beta blocker use..and one C= CHF  Adherence is always the initial "prime suspect" and is a multilayered concern that requires a "trust but verify" approach in every patient - starting with knowing how to use medications, especially inhalers, correctly, keeping up with refills and understanding the fundamental difference between maintenance and prns vs those medications only taken for a very short course and then stopped and not refilled.  - The proper method of use, as well as anticipated side effects, of a metered-dose inhaler are discussed and demonstrated to the patient. Improved effectiveness after extensive coaching during this visit to a level of approximately  0% so may not be able to use hfa at all here > ? Change entirely to dpi but this may aggravate the chronic cough ? Change to neb ics/laba/sama  And prn neb saba -  To keep things simple, I have asked the patient to first separate medicines that are perceived as maintenance, that is to be taken daily "no matter what", from those medicines that are taken on only on an as-needed basis and I have given the patient examples of both, and then return to see our NP to generate a  detailed  medication calendar which should be followed until the next physician sees the patient and updates it.   Active smoking discussed separately   ? Allergy > strongly doubt so would consier stop accolate at next ov but give Prednisone 10 mg take  4 each am x 2 days,   2 each am x 2 days,  1 each am x 2 days and stop for any asthmatic component that may be present  CHF likely component of cardiac asthma  suggested by last ct chest > f/ u cards encourage    Each maintenance medication was reviewed in detail including most importantly the difference between maintenance and as needed and under what circumstances the prns are to be used.  Please see instructions for details which were reviewed in writing and the patient given a copy.

## 2014-11-21 NOTE — Assessment & Plan Note (Signed)

## 2014-11-26 ENCOUNTER — Ambulatory Visit (INDEPENDENT_AMBULATORY_CARE_PROVIDER_SITE_OTHER): Payer: Commercial Managed Care - HMO | Admitting: Pharmacist

## 2014-11-26 VITALS — BP 108/62

## 2014-11-26 DIAGNOSIS — I480 Paroxysmal atrial fibrillation: Secondary | ICD-10-CM

## 2014-11-26 LAB — POCT INR: INR: 3.4

## 2014-11-26 NOTE — Patient Instructions (Signed)
Anticoagulation Dose Instructions as of 11/26/2014      [name] Tue Wed Thu Fri Sat   New Dose 1 mg 1 mg 1 mg 1 mg 1 mg 2 mg 1 mg    Description        Skip today and tomorrow's dose. (4/18 & 4/19). Then continue 1/2 tablet daily except whole tablet on fridays

## 2014-11-27 DIAGNOSIS — E1152 Type 2 diabetes mellitus with diabetic peripheral angiopathy with gangrene: Secondary | ICD-10-CM | POA: Diagnosis not present

## 2014-11-27 DIAGNOSIS — B351 Tinea unguium: Secondary | ICD-10-CM | POA: Diagnosis not present

## 2014-11-27 DIAGNOSIS — L84 Corns and callosities: Secondary | ICD-10-CM | POA: Diagnosis not present

## 2014-12-04 ENCOUNTER — Encounter: Payer: Commercial Managed Care - HMO | Admitting: Adult Health

## 2014-12-05 ENCOUNTER — Other Ambulatory Visit: Payer: Self-pay

## 2014-12-05 MED ORDER — LACTULOSE 10 GM/15ML PO SOLN: 10.0000 g | Freq: Two times a day (BID) | ORAL | Status: DC

## 2014-12-05 NOTE — Telephone Encounter (Signed)
Last seen 10/24/14 Dr Livia Snellen  This med not on EPIC list

## 2014-12-07 ENCOUNTER — Other Ambulatory Visit: Payer: Self-pay | Admitting: Nurse Practitioner

## 2014-12-07 ENCOUNTER — Other Ambulatory Visit: Payer: Self-pay | Admitting: Family Medicine

## 2014-12-11 NOTE — Progress Notes (Signed)
Remote pacemaker transmission.   

## 2014-12-12 LAB — CUP PACEART REMOTE DEVICE CHECK
Battery Remaining Percentage: 91 %
Battery Voltage: 2.98 V
Brady Statistic RV Percent Paced: 14 %
Lead Channel Setting Pacing Amplitude: 2.5 V
Lead Channel Setting Sensing Sensitivity: 2 mV
MDC IDC MSMT BATTERY REMAINING LONGEVITY: 134 mo
MDC IDC MSMT LEADCHNL RV IMPEDANCE VALUE: 790 Ohm
MDC IDC MSMT LEADCHNL RV PACING THRESHOLD AMPLITUDE: 0.5 V
MDC IDC MSMT LEADCHNL RV PACING THRESHOLD PULSEWIDTH: 0.5 ms
MDC IDC MSMT LEADCHNL RV SENSING INTR AMPL: 10.9 mV
MDC IDC SESS DTM: 20160408163339
MDC IDC SET LEADCHNL RV PACING PULSEWIDTH: 0.5 ms
Pulse Gen Serial Number: 7199163

## 2014-12-17 ENCOUNTER — Ambulatory Visit (INDEPENDENT_AMBULATORY_CARE_PROVIDER_SITE_OTHER): Payer: Commercial Managed Care - HMO | Admitting: Pharmacist

## 2014-12-17 DIAGNOSIS — I480 Paroxysmal atrial fibrillation: Secondary | ICD-10-CM | POA: Diagnosis not present

## 2014-12-17 DIAGNOSIS — M810 Age-related osteoporosis without current pathological fracture: Secondary | ICD-10-CM

## 2014-12-17 DIAGNOSIS — F419 Anxiety disorder, unspecified: Secondary | ICD-10-CM

## 2014-12-17 LAB — POCT INR: INR: 3.6

## 2014-12-17 MED ORDER — HYDROCODONE-ACETAMINOPHEN 5-325 MG PO TABS: 1.0000 | ORAL_TABLET | Freq: Three times a day (TID) | ORAL | Status: DC | PRN

## 2014-12-17 MED ORDER — CLONAZEPAM 0.5 MG PO TABS: 0.5000 mg | ORAL_TABLET | Freq: Three times a day (TID) | ORAL | Status: DC | PRN

## 2014-12-17 NOTE — Patient Instructions (Signed)
Anticoagulation Dose Instructions as of 12/17/2014      [name] Tue Wed Thu Fri Sat   New Dose 1 mg 1 mg 1 mg 1 mg 1 mg 1 mg 1 mg    Description        No warfarin today - Monday, May 9th and tomorrow Tuesday, may 10th.   Then decrease warfarin dose to warfarin 2mg  - take 1/2 tablet daily.      INR was 3.6 today

## 2014-12-17 NOTE — Telephone Encounter (Signed)
Filled out faxed request

## 2014-12-18 ENCOUNTER — Encounter: Payer: Self-pay | Admitting: Adult Health

## 2014-12-18 ENCOUNTER — Ambulatory Visit (INDEPENDENT_AMBULATORY_CARE_PROVIDER_SITE_OTHER): Payer: Commercial Managed Care - HMO | Admitting: Adult Health

## 2014-12-18 VITALS — BP 100/50 | HR 67 | Temp 98.2°F | Ht 63.0 in | Wt 147.0 lb

## 2014-12-18 DIAGNOSIS — J449 Chronic obstructive pulmonary disease, unspecified: Secondary | ICD-10-CM

## 2014-12-18 DIAGNOSIS — Z720 Tobacco use: Secondary | ICD-10-CM | POA: Diagnosis not present

## 2014-12-18 NOTE — Assessment & Plan Note (Signed)
Smoking cessation encouraged Resume COPD with PFTs pending Patient's medications were reviewed today and patient education was given. Computerized medication calendar was adjusted/completed   Plan  Follow med calendar closely and bring to each visit.  follow up Dr. Melvyn Novas  In 4-6 weeks with PFT  Please contact office for sooner follow up if symptoms do not improve or worsen or seek emergency care

## 2014-12-18 NOTE — Addendum Note (Signed)
Addended by: Oscar La R on: 12/18/2014 10:49 AM   Modules accepted: Orders

## 2014-12-18 NOTE — Patient Instructions (Addendum)
Follow med calendar closely and bring to each visit.  follow up Dr. Melvyn Novas  In 4-6 weeks with PFT  Please contact office for sooner follow up if symptoms do not improve or worsen or seek emergency care

## 2014-12-18 NOTE — Assessment & Plan Note (Signed)
Smoking cessation  

## 2014-12-18 NOTE — Progress Notes (Signed)
Subjective:     Patient ID: [name], female   DOB: 1935/06/07, MRN: [id number]  HPI  81 yowf active smoker with variable sob x around 2012 eval by Koleen Nimrod dx copd referred by [name] for cough > sob    11/20/2014 1st [facility] office visit/ Wert  / on spiriva /symb/ multiple forms of sab  Chief Complaint  Patient presents with  . Advice Only    chronic bronchitis; cough; wheezing;    variably sob x 4 y maint on symb/ spiriva/ much better p prednisone in past but raises sugars but even then "best days" are gradually declining in terms of what she can tolerate  Uses 02 hs only x 4 y  On best days can still do mailbox and back then stops on return to use her saba and catch her breath but note hfa = 0% effective technique at ov (see a/p)  >pred taper   12/18/2014 Follow up and Med review  Patient returns for a two-week follow-up and medication review Patient was seen last visit for a pulmonary consultation for shortness of breath , cough and COPD She is a active smoker. PFTs are pending She is on Symbicort and Spiriva. On nocturnal O2.  He was given a prednisone taper last visit. Since last visit. She is feeling a little better.  We reviewed all her medications organize them into a medication count with patient education Appears she is taking her medications correctly.  PVX and Prevnar are utd.    Current Medications, Allergies, Complete Past Medical History, Past Surgical History, Family History, and Social History were reviewed in [employer] record.  ROS  The following are not active complaints unless bolded sore throat, dysphagia, dental problems, itching, sneezing,  nasal congestion or excess/ purulent secretions, ear ache,   fever, chills, sweats, unintended wt loss, pleuritic or exertional cp, hemoptysis,  orthopnea pnd or leg swelling, presyncope, palpitations, heartburn, abdominal pain, anorexia, nausea, vomiting, diarrhea   or change in bowel or urinary habits, change in stools or urine, dysuria,hematuria,  rash, arthralgias, visual complaints, headache, numbness weakness or ataxia or problems with walking or coordination,  change in mood/affect or memory.        Review of Systems     Objective:   Physical Exam endentulous wf nad Filed Vitals:   12/18/14 1017  BP: 100/50  Pulse: 67  Temp: 98.2 F (36.8 C)  TempSrc: Oral  Height: 5\' 3"  (1.6 m)  Weight: 147 lb (66.679 kg)  SpO2: 97%      Vital signs reviewed   HEENT mild turbinate edema.  Oropharynx no thrush or excess pnd or cobblestoning.  No JVD or cervical adenopathy. Mild accessory muscle hypertrophy. Trachea midline, nl thryroid. Chest was hyperinflated by percussion with diminished breath sounds and moderate increased exp time without wheeze. Hoover sign positive at mid inspiration. Regular rate and rhythm without murmur gallop or rub or increase P2 or edema.  Abd: no hsm, nl excursion. Ext warm without cyanosis or clubbing.    CT chest 09/29/14 report reviewed Small bilateral effusions, L > R / mild edema/ and unclerly copd and dilation of LV      Assessment:

## 2014-12-19 ENCOUNTER — Other Ambulatory Visit: Payer: Self-pay | Admitting: *Deleted

## 2014-12-19 MED ORDER — GLUCOSE BLOOD VI STRP: ORAL_STRIP | Status: DC

## 2014-12-19 MED ORDER — TRUEPLUS LANCETS 28G MISC: Status: DC

## 2014-12-19 MED ORDER — TRUE METRIX AIR GLUCOSE METER DEVI: 1.0000 | Freq: Two times a day (BID) | Status: DC

## 2014-12-20 ENCOUNTER — Encounter: Payer: Self-pay | Admitting: Cardiology

## 2014-12-25 ENCOUNTER — Encounter: Payer: Self-pay | Admitting: Internal Medicine

## 2014-12-27 NOTE — Addendum Note (Signed)
Addended by: Inge Rise on: 12/27/2014 02:39 PM   Modules accepted: Orders, Medications

## 2014-12-31 ENCOUNTER — Ambulatory Visit (INDEPENDENT_AMBULATORY_CARE_PROVIDER_SITE_OTHER): Payer: Commercial Managed Care - HMO

## 2014-12-31 ENCOUNTER — Ambulatory Visit (HOSPITAL_COMMUNITY)
Admission: RE | Admit: 2014-12-31 | Discharge: 2014-12-31 | Disposition: A | Discharge status: Home / Self Care | Payer: Commercial Managed Care - HMO | Source: Ambulatory Visit | Attending: Nurse Practitioner | Admitting: Nurse Practitioner

## 2014-12-31 ENCOUNTER — Ambulatory Visit (INDEPENDENT_AMBULATORY_CARE_PROVIDER_SITE_OTHER): Payer: Commercial Managed Care - HMO | Admitting: Nurse Practitioner

## 2014-12-31 ENCOUNTER — Encounter: Payer: Self-pay | Admitting: Nurse Practitioner

## 2014-12-31 VITALS — BP 115/76 | HR 82 | Temp 96.9°F | Ht 63.0 in | Wt 146.8 lb

## 2014-12-31 DIAGNOSIS — I495 Sick sinus syndrome: Secondary | ICD-10-CM | POA: Diagnosis not present

## 2014-12-31 DIAGNOSIS — R1084 Generalized abdominal pain: Secondary | ICD-10-CM | POA: Diagnosis not present

## 2014-12-31 DIAGNOSIS — K219 Gastro-esophageal reflux disease without esophagitis: Secondary | ICD-10-CM | POA: Diagnosis not present

## 2014-12-31 DIAGNOSIS — K567 Ileus, unspecified: Secondary | ICD-10-CM

## 2014-12-31 DIAGNOSIS — I2510 Atherosclerotic heart disease of native coronary artery without angina pectoris: Secondary | ICD-10-CM

## 2014-12-31 DIAGNOSIS — I10 Essential (primary) hypertension: Secondary | ICD-10-CM

## 2014-12-31 DIAGNOSIS — R109 Unspecified abdominal pain: Secondary | ICD-10-CM | POA: Insufficient documentation

## 2014-12-31 DIAGNOSIS — E1110 Type 2 diabetes mellitus with ketoacidosis without coma: Secondary | ICD-10-CM

## 2014-12-31 DIAGNOSIS — E559 Vitamin D deficiency, unspecified: Secondary | ICD-10-CM

## 2014-12-31 DIAGNOSIS — J449 Chronic obstructive pulmonary disease, unspecified: Secondary | ICD-10-CM | POA: Diagnosis not present

## 2014-12-31 DIAGNOSIS — R140 Abdominal distension (gaseous): Secondary | ICD-10-CM | POA: Diagnosis not present

## 2014-12-31 DIAGNOSIS — K838 Other specified diseases of biliary tract: Secondary | ICD-10-CM | POA: Diagnosis not present

## 2014-12-31 DIAGNOSIS — E1310 Other specified diabetes mellitus with ketoacidosis without coma: Secondary | ICD-10-CM

## 2014-12-31 DIAGNOSIS — Z950 Presence of cardiac pacemaker: Secondary | ICD-10-CM | POA: Diagnosis not present

## 2014-12-31 DIAGNOSIS — Z720 Tobacco use: Secondary | ICD-10-CM | POA: Diagnosis not present

## 2014-12-31 DIAGNOSIS — E785 Hyperlipidemia, unspecified: Secondary | ICD-10-CM | POA: Diagnosis not present

## 2014-12-31 DIAGNOSIS — I5033 Acute on chronic diastolic (congestive) heart failure: Secondary | ICD-10-CM | POA: Diagnosis not present

## 2014-12-31 DIAGNOSIS — F419 Anxiety disorder, unspecified: Secondary | ICD-10-CM

## 2014-12-31 DIAGNOSIS — I480 Paroxysmal atrial fibrillation: Secondary | ICD-10-CM | POA: Diagnosis not present

## 2014-12-31 LAB — LIPID PANEL
HDL: 25 mg/dL — AB (ref 35–70)
LDL Cholesterol: 65 mg/dL
Triglycerides: 102 mg/dL (ref 40–160)

## 2014-12-31 LAB — POCT INR: INR: 2

## 2014-12-31 LAB — POCT I-STAT CREATININE: Creatinine, Ser: 1.3 mg/dL — ABNORMAL HIGH (ref 0.44–1.00)

## 2014-12-31 LAB — POCT GLYCOSYLATED HEMOGLOBIN (HGB A1C): Hemoglobin A1C: 5.4

## 2014-12-31 MED ORDER — IOHEXOL 300 MG/ML  SOLN
100.0000 mL | Freq: Once | INTRAMUSCULAR | Status: AC | PRN
  Administered 2014-12-31: 80 mL via INTRAVENOUS

## 2014-12-31 MED ORDER — POTASSIUM CHLORIDE CRYS ER 20 MEQ PO TBCR: 20.0000 meq | EXTENDED_RELEASE_TABLET | Freq: Every morning | ORAL | Status: DC

## 2014-12-31 MED ORDER — SITAGLIPTIN PHOSPHATE 50 MG PO TABS: 50.0000 mg | ORAL_TABLET | Freq: Every day | ORAL | Status: DC

## 2014-12-31 MED ORDER — CLONAZEPAM 0.5 MG PO TABS
0.5000 mg | ORAL_TABLET | Freq: Three times a day (TID) | ORAL | Status: DC | PRN
Start: 2014-12-31 — End: 2015-02-14

## 2014-12-31 MED ORDER — SIMVASTATIN 10 MG PO TABS: 10.0000 mg | ORAL_TABLET | Freq: Every day | ORAL | Status: DC

## 2014-12-31 NOTE — Patient Instructions (Signed)
Anticoagulation Dose Instructions as of 12/31/2014      Dorene Grebe Tue Wed Thu Fri Sat   New Dose 1 mg 1 mg 1 mg 1 mg 1 mg 1 mg 1 mg    Description        No warfarin today - Monday, May 9th and tomorrow Tuesday, may 10th.   Then decrease warfarin dose to warfarin 2mg  - take 1/2 tablet daily.

## 2014-12-31 NOTE — Progress Notes (Signed)
Subjective:    Patient ID: [name], female    DOB: 11-30-1934, 80 y.o.   MRN: [id number]  [facility] Has no complaints today. She has a  Long history of medical problems and is on a lot of meds. SHe denies chest pain or SOB- she still has peripheral edema a couple of times a week , but usually resolves at night. Her blood sugars have been fluctuating from 90's to 200 at times depending on what she eats. Her only complaint today is she has trouble sleeping and wake sup all night long. SHe is on protonix for GERD and she gets awaken with stomach pain that she has to take maalox for- She has seen GI and cannot find anything wrong. Stomach pain has gotten worse over the last 2 months.  Patient Active Problem List   Diagnosis Date Noted  . Hyperlipidemia with target LDL less than 100 12/31/2014  . Cigarette smoker 11/21/2014  . Osteoporosis, post-menopausal 11/01/2013  . GERD (gastroesophageal reflux disease) 07/13/2013  . Diarrhea 07/13/2013  . Aortic stenosis 06/27/2013  . Acute on chronic diastolic CHF (congestive heart failure) 06/27/2013  . Vitamin D deficiency   . Tobacco abuse 05/12/2012  . Abdominal wall hernia 10/19/2011  . CAD (coronary artery disease)   . Pacemaker-St.Jude   . Tachycardia-bradycardia syndrome 12/03/2010  . Chronic anticoagulation 12/03/2010  . Anxiety 12/03/2010  . Mitral stenosis 05/30/2010  . Anemia 05/16/2010  . DM (diabetes mellitus) type 2, uncontrolled, with ketoacidosis 01/19/2009  . Essential hypertension 01/19/2009  . Paroxysmal atrial fibrillation 01/19/2009  . COPD pfts pending  01/19/2009  . HEART MURMUR, BENIGN 01/19/2009   Outpatient Encounter Prescriptions as of 12/31/2014  Medication Sig  . albuterol (VENTOLIN HFA) 108 (90 BASE) MCG/ACT inhaler Inhale 1 to 2 puffs into lung every 4 to 6 hours as needed for SOB / wheezing (Patient taking differently: Inhale 2 puffs into the lungs every  4 (four) hours as needed (plan B). )  . Blood Glucose Calibration (TRUE METRIX LEVEL 1) LOW SOLN Use weekly  . Blood Glucose Monitoring Suppl (TRUE METRIX AIR GLUCOSE METER) DEVI 1 Device by Does not apply route 2 (two) times daily. Use to Test blood sugar bid. DX E13.10  . clonazePAM (KLONOPIN) 0.5 MG tablet Take 1 tablet (0.5 mg total) by mouth 3 (three) times daily as needed for anxiety.  [name] diltiazem (CARDIZEM) 120 MG tablet Take 1 tablet (120 mg total) by mouth daily.  . furosemide (LASIX) 40 MG tablet Take 1 tablet (40 mg total) by mouth daily.  [name] glucose blood test strip True metrix test strips. Test bid. E13.10  . ipratropium-albuterol (DUONEB) 0.5-2.5 (3) MG/3ML SOLN Take 3 mLs by nebulization every 4 (four) hours as needed. Plan C  . lipase/protease/amylase (CREON) 36000 UNITS CPEP capsule 3 PO WITH MEALS AND 1 WITH SNACK (Patient taking differently: 3 capsules every AM, at noon and PM)  . nitroGLYCERIN (NITROSTAT) 0.4 MG SL tablet Place 1 tablet (0.4 mg total) under the tongue every 5 (five) minutes as needed for chest pain.  . pantoprazole (PROTONIX) 40 MG tablet Take 1 tablet (40 mg total) by mouth 2 (two) times daily. (Patient taking differently: Take 40 mg by mouth 2 (two) times daily before a meal. )  . potassium chloride SA (K-DUR,KLOR-CON) 20 MEQ tablet Take 20 mEq by mouth every morning.   . Probiotic Product ([place]) CAPS Take 1 capsule by mouth  daily.  [name] PROLIA 60 MG/ML SOLN injection INJECT 60 MG INTO THE SKIN ONCE. ADMINISTER IN UPPER ARM, THIGH, OR AB DOMEN (Patient taking differently: INJECT 60 MG INTO THE SKIN ONCE. ADMINISTER IN UPPER ARM, THIGH, OR AB DOMEN every feb/august)  . simvastatin (ZOCOR) 10 MG tablet TAKE ONE TABLET BY MOUTH ONE TIME DAILY  . sitaGLIPtin (JANUVIA) 50 MG tablet Take 1 tablet (50 mg total) by mouth daily.  . SYMBICORT 160-4.5 MCG/ACT inhaler INHALE 2 PUFFS TWICE DAILY  . tiotropium (SPIRIVA HANDIHALER) 18 MCG inhalation capsule  INHALE CONTENTS ONE CAPSULE BY MOUTH ONE TIME DAILY  . TRUEPLUS LANCETS 28G MISC Test Blood sugar bid. DX E13.10  . warfarin (COUMADIN) 2 MG tablet TAKE PER PHYSICIAN INSTRUCTIONS  . zafirlukast (ACCOLATE) 20 MG tablet Take 1 tablet (20 mg total) by mouth 2 (two) times daily.   Facility-Administered Encounter Medications as of 12/31/2014  Medication  . levalbuterol (XOPENEX) nebulizer solution 1.25 mg      Review of Systems  Constitutional: Negative.   HENT: Negative.   Respiratory: Negative.   Cardiovascular: Negative.   Genitourinary: Negative.   Neurological: Negative.   Psychiatric/Behavioral: Negative.   All other systems reviewed and are negative.      Objective:   Physical Exam  Constitutional: She is oriented to person, place, and time. She appears well-developed and well-nourished.  HENT:  Nose: Nose normal.  Mouth/Throat: Oropharynx is clear and moist.  Eyes: EOM are normal.  Neck: Trachea normal, normal range of motion and full passive range of motion without pain. Neck supple. No JVD present. Carotid bruit is not present. No thyromegaly present.  Cardiovascular: Normal rate, regular rhythm, normal heart sounds and intact distal pulses.  Exam reveals no gallop and no friction rub.   No murmur heard. Pulmonary/Chest: Effort normal and breath sounds normal.  Abdominal: Soft. Bowel sounds are normal. She exhibits no distension and no mass. There is tenderness (bil lower quadrant pain).  Musculoskeletal: Normal range of motion.  Lymphadenopathy:    She has no cervical adenopathy.  Neurological: She is alert and oriented to person, place, and time. She has normal reflexes.  Skin: Skin is warm and dry.  Psychiatric: She has a normal mood and affect. Her behavior is normal. Judgment and thought content normal.   BP 115/76 mmHg  Pulse 82  Temp(Src) 96.9 F (36.1 C) (Oral)  Ht 5' 3"  (1.6 m)  Wt 146 lb 12.8 oz (66.588 kg)  BMI 26.01 kg/m2   KUB- multiple air gas  levels-Preliminary reading by [name], FNP  [facility]       Assessment & Plan:   1. Paroxysmal atrial fibrillation   2. Essential hypertension   3. Coronary artery disease involving native coronary artery of native heart without angina pectoris   4. Gastroesophageal reflux disease without esophagitis   5. DM (diabetes mellitus) type 2, uncontrolled, with ketoacidosis   6. Vitamin D deficiency   7. Tobacco abuse   8. Pacemaker-St.Jude   9. Anxiety   10. COPD pfts pending    11. Acute on chronic diastolic CHF (congestive heart failure)   12. Tachycardia-bradycardia syndrome   13. Hyperlipidemia with target LDL less than 100   14. Generalized abdominal pain   15. Ileus    Meds ordered this encounter  Medications  . clonazePAM (KLONOPIN) 0.5 MG tablet    Sig: Take 1 tablet (0.5 mg total) by mouth 3 (three) times daily as needed for anxiety.    Dispense:  90 tablet  Refill:  0    Do to fill till 01/16/15    Order Specific Question:  Supervising Provider    Answer:  Chipper Herb [[id number]]  . sitaGLIPtin (JANUVIA) 50 MG tablet    Sig: Take 1 tablet (50 mg total) by mouth daily.    Dispense:  90 tablet    Refill:  0    Order Specific Question:  Supervising Provider    Answer:  Chipper Herb [[id number]]  . simvastatin (ZOCOR) 10 MG tablet    Sig: Take 1 tablet (10 mg total) by mouth daily.    Dispense:  30 tablet    Refill:  5    Order Specific Question:  Supervising Provider    Answer:  Chipper Herb [[id number]]  . potassium chloride SA (K-DUR,KLOR-CON) 20 MEQ tablet    Sig: Take 1 tablet (20 mEq total) by mouth every morning.    Dispense:  30 tablet    Refill:  5    Order Specific Question:  Supervising Provider    Answer:  Chipper Herb [[id number]]   Orders Placed This Encounter  Procedures  . DG Abd 1 View    Standing Status: Future     Number of Occurrences: 1     Standing Expiration Date: 03/01/2016    Order Specific Question:  Reason for Exam (SYMPTOM  OR DIAGNOSIS  REQUIRED)    Answer:  abdominal pain    Order Specific Question:  Preferred imaging location?    Answer:  Internal  . CT Abdomen Pelvis W Contrast    Standing Status: Future     Number of Occurrences:      Standing Expiration Date: 04/01/2016    Order Specific Question:  Reason for Exam (SYMPTOM  OR DIAGNOSIS REQUIRED)    Answer:  ileus    Order Specific Question:  Preferred imaging location?    Answer:  [place]  . NMR, lipoprofile  . POCT glycosylated hemoglobin (Hb A1C)   CT of abdomen ordered NPO till after test WIll talk once get test results  [place], FNP

## 2015-01-01 LAB — CMP14+EGFR
A/G RATIO: 1.3 (ref 1.1–2.5)
ALBUMIN: 3.9 g/dL (ref 3.5–4.8)
ALT: 9 IU/L (ref 0–32)
AST: 18 IU/L (ref 0–40)
Alkaline Phosphatase: 64 IU/L (ref 39–117)
BILIRUBIN TOTAL: 0.3 mg/dL (ref 0.0–1.2)
BUN/Creatinine Ratio: 17 (ref 11–26)
BUN: 24 mg/dL (ref 8–27)
CO2: 24 mmol/L (ref 18–29)
Calcium: 8.8 mg/dL (ref 8.7–10.3)
Chloride: 97 mmol/L (ref 97–108)
Creatinine, Ser: 1.38 mg/dL — ABNORMAL HIGH (ref 0.57–1.00)
GFR calc non Af Amer: 37 mL/min/{1.73_m2} — ABNORMAL LOW (ref >59)
GFR, EST AFRICAN AMERICAN: 43 mL/min/{1.73_m2} — AB (ref >59)
Globulin, Total: 3.1 g/dL (ref 1.5–4.5)
Glucose: 102 mg/dL — ABNORMAL HIGH (ref 65–99)
Potassium: 4.4 mmol/L (ref 3.5–5.2)
Sodium: 137 mmol/L (ref 134–144)
Total Protein: 7 g/dL (ref 6.0–8.5)

## 2015-01-01 LAB — NMR, LIPOPROFILE
Cholesterol: 110 mg/dL (ref 100–199)
HDL Cholesterol by NMR: 25 mg/dL — ABNORMAL LOW (ref >39)
HDL Particle Number: 16.5 umol/L — ABNORMAL LOW (ref >=30.5)
LDL Particle Number: 972 nmol/L (ref <1000)
LDL Size: 20.3 nm (ref >20.5)
LDL-C: 65 mg/dL (ref 0–99)
LP-IR SCORE: 51 — AB (ref <=45)
Small LDL Particle Number: 592 nmol/L — ABNORMAL HIGH (ref <=527)
Triglycerides by NMR: 102 mg/dL (ref 0–149)

## 2015-01-02 ENCOUNTER — Telehealth: Payer: Self-pay

## 2015-01-02 NOTE — Telephone Encounter (Signed)
Needs OV.  

## 2015-01-02 NOTE — Telephone Encounter (Signed)
Pt called and is having right side abdominal pain and sometimes it is all the way across her stomach. She said it hurts and it burns. Been going on about 3-4 days. She will have a day and not have a BM and she will take a stool softner then it will be nothing but water. She is taking her Protonix bid, probiotic qd, and her Creon 3 with meals and one with snacks.  When she eats she has swelling and bloating. She went to PCP on Monday for another checkup and she ordered a CT but it did not show anything. Please advise! Routing to Laban Emperor, NP in Dr. Oneida Alar absence.

## 2015-01-03 ENCOUNTER — Encounter: Payer: Self-pay | Admitting: Cardiology

## 2015-01-03 NOTE — Telephone Encounter (Signed)
LMOM to call.

## 2015-01-03 NOTE — Telephone Encounter (Signed)
Pt returned call and is scheduled for OV with Walden Field, NP on 01/15/2015 at 10:00 AM.

## 2015-01-14 ENCOUNTER — Encounter: Payer: Self-pay | Admitting: Family Medicine

## 2015-01-15 ENCOUNTER — Telehealth: Payer: Self-pay | Admitting: Nurse Practitioner

## 2015-01-15 ENCOUNTER — Telehealth: Payer: Self-pay

## 2015-01-15 ENCOUNTER — Ambulatory Visit: Payer: Commercial Managed Care - HMO | Admitting: Nurse Practitioner

## 2015-01-15 NOTE — Telephone Encounter (Signed)
Reviewed

## 2015-01-15 NOTE — Telephone Encounter (Signed)
Pt was 25 minutes late for OV and got upset because she couldn't be seen. She was told of the office policy of being 15 minutes late you will have to be rescheduled. She signed a release of records and wants to transfer care to Dr Britta Mccreedy in [place].

## 2015-01-15 NOTE — Telephone Encounter (Signed)
Pt had an appointment today at 10:00 but did not get here until 10:25. I informed her that we could not see her since she was late and I would be glad to reschedule her. She got very up set and stated that she would not be coming back and she want all of her records because she was going to [facility] in [place]. I told her that she would have to sign a release but she left before I could get her to sign the paper.

## 2015-01-15 NOTE — Telephone Encounter (Signed)
Noted  

## 2015-01-15 NOTE — Telephone Encounter (Signed)
REVIEWED-NO ADDITIONAL RECOMMENDATIONS. 

## 2015-01-17 ENCOUNTER — Telehealth: Payer: Self-pay | Admitting: Nurse Practitioner

## 2015-01-17 NOTE — Telephone Encounter (Signed)
MMM pt, see if you can get Sabra Heck to do. Not on med list, but she has been taking it along time

## 2015-01-18 MED ORDER — HYDROCODONE-ACETAMINOPHEN 5-325 MG PO TABS: 1.0000 | ORAL_TABLET | Freq: Three times a day (TID) | ORAL | Status: DC | PRN

## 2015-01-21 ENCOUNTER — Ambulatory Visit (INDEPENDENT_AMBULATORY_CARE_PROVIDER_SITE_OTHER): Payer: Commercial Managed Care - HMO | Admitting: Internal Medicine

## 2015-01-21 ENCOUNTER — Encounter: Payer: Self-pay | Admitting: Internal Medicine

## 2015-01-21 ENCOUNTER — Ambulatory Visit (INDEPENDENT_AMBULATORY_CARE_PROVIDER_SITE_OTHER)
Admission: RE | Admit: 2015-01-21 | Discharge: 2015-01-21 | Disposition: A | Discharge status: Home / Self Care | Payer: Commercial Managed Care - HMO | Source: Ambulatory Visit | Attending: Internal Medicine | Admitting: Internal Medicine

## 2015-01-21 DIAGNOSIS — J984 Other disorders of lung: Secondary | ICD-10-CM | POA: Diagnosis not present

## 2015-01-21 DIAGNOSIS — J449 Chronic obstructive pulmonary disease, unspecified: Secondary | ICD-10-CM

## 2015-01-21 LAB — PULMONARY FUNCTION TEST
DL/VA % pred: 54 %
DL/VA: 2.46 ml/min/mmHg/L
DLCO UNC % PRED: 39 %
DLCO UNC: 8.54 ml/min/mmHg
FEF 25-75 Post: 1.36 L/sec
FEF 25-75 Pre: 1.2 L/sec
FEF2575-%CHANGE-POST: 13 %
FEF2575-%PRED-PRE: 85 %
FEF2575-%Pred-Post: 96 %
FEV1-%CHANGE-POST: 3 %
FEV1-%PRED-PRE: 92 %
FEV1-%Pred-Post: 95 %
FEV1-Post: 1.73 L
FEV1-Pre: 1.68 L
FEV1FVC-%Change-Post: 5 %
FEV1FVC-%PRED-PRE: 99 %
FEV6-%CHANGE-POST: -1 %
FEV6-%PRED-POST: 96 %
FEV6-%PRED-PRE: 98 %
FEV6-PRE: 2.29 L
FEV6-Post: 2.25 L
FEV6FVC-%PRED-PRE: 105 %
FEV6FVC-%Pred-Post: 105 %
FVC-%Change-Post: -1 %
FVC-%PRED-POST: 91 %
FVC-%PRED-PRE: 93 %
FVC-POST: 2.25 L
FVC-PRE: 2.29 L
POST FEV1/FVC RATIO: 77 %
Post FEV6/FVC ratio: 100 %
Pre FEV1/FVC ratio: 73 %
Pre FEV6/FVC Ratio: 100 %

## 2015-01-21 NOTE — Progress Notes (Signed)
Subjective:     Patient ID: [name], female   DOB: 1934/09/19 MRN: [id number]    Brief patient profile:  10 yowf active smoker with variable sob x around 2012 eval by Koleen Nimrod dx copd referred by [name] for cough > sob    History of Present Illness  11/20/2014 1st [facility] office visit/ Wert  / on spiriva /symb/ multiple forms of sab  Chief Complaint  Patient presents with  . Advice Only    chronic bronchitis; cough; wheezing;    variably sob x 4 y maint on symb/ spiriva/ much better p prednisone in past but raises sugars but even then "best days" are gradually declining in terms of what she can tolerate  Uses 02 hs only x 4 y  On best days can still do mailbox and back then stops on return to use her saba and catch her breath but note hfa = 0% effective technique at ov (see a/p)  >pred taper   12/18/2014 NP Follow up and Med review  Patient returns for a two-week follow-up and medication review Patient was seen last visit for a pulmonary consultation for shortness of breath , cough and COPD She is a active smoker. PFTs are pending She is on Symbicort and Spiriva. On nocturnal O2.  He was given a prednisone taper last visit. Since last visit. She is feeling a little better.   rec Follow med calendar   01/21/2015 f/u ov/Wert re: cb/ no sign airflow obst  / still smoking / no med calendar  Chief Complaint  Patient presents with  . Follow-up    PFT done today. Pt states still coughing- non prod at this time. Her breathing has improved and she has not needed to use rescue inhaler, but uses neb 1 x per day.   main problem is am cough/congestion but no purulent sputum Limited more by legs/ cane than doe    No obvious day to day or daytime variabilty or assoc   cp or chest tightness, subjective wheeze overt sinus or hb symptoms. No unusual exp hx or h/o childhood pna/ asthma or knowledge of premature birth.  Sleeping ok without nocturnal  or early am  exacerbation  of respiratory  c/o's or need for noct saba. Also denies any obvious fluctuation of symptoms with weather or environmental changes or other aggravating or alleviating factors except as outlined above   Current Medications, Allergies, Complete Past Medical History, Past Surgical History, Family History, and Social History were reviewed in [employer] record.  ROS  The following are not active complaints unless bolded sore throat, dysphagia, dental problems, itching, sneezing,  nasal congestion or excess/ purulent secretions, ear ache,   fever, chills, sweats, unintended wt loss, pleuritic or exertional cp, hemoptysis,  orthopnea pnd or leg swelling, presyncope, palpitations, heartburn, abdominal pain, anorexia, nausea, vomiting, diarrhea  or change in bowel or urinary habits, change in stools or urine, dysuria,hematuria,  rash, arthralgias, visual complaints, headache, numbness weakness or ataxia or problems with walking or coordination,  change in mood/affect or memory.              Objective:   Physical Exam  endentulous wf nad   Wt Readings from Last 3 Encounters:  01/21/15 147 lb (66.679 kg)  12/31/14 146 lb 12.8 oz (66.588 kg)  12/18/14 147 lb (66.679 kg)    Vital signs reviewed       HEENT: nl dentition, turbinates, and orophanx. Nl external ear canals without  cough reflex   NECK :  without JVD/Nodes/TM/ nl carotid upstrokes bilaterally   LUNGS: no acc muscle use, clear to A and P bilaterally without cough on insp or exp maneuvers   CV:  RRR  no s3 or murmur or increase in P2, no edema   ABD:  soft and nontender with nl excursion in the supine position. No bruits or organomegaly, bowel sounds nl  MS:  warm without deformities, calf tenderness, cyanosis or clubbing  SKIN: warm and dry without lesions    NEURO:  alert, approp, no deficits        CXR PA and Lateral:   01/21/2015 :     I personally reviewed images and agree with  radiology impression as follows:    No edema or consolidation. Slight scarring lateral left base.     Assessment:

## 2015-01-21 NOTE — Patient Instructions (Addendum)
Stop spiriva  On next refill of accolate ok to stop it and see if miss it and if not stay off it   The key is to stop smoking completely before smoking completely stops you - it's not too late!!!  Please remember to go to the  x-ray department downstairs for your tests - we will call you with the results when they are available.   If you are satisfied with your treatment plan,  let your doctor know and he/she can either refill your medications or you can return here when your prescription runs out.     If in any way you are not 100% satisfied,  please tell us.  If 100% better, tell your friends!  Pulmonary follow up is as needed

## 2015-01-21 NOTE — Progress Notes (Signed)
PFT done today. 

## 2015-01-21 NOTE — Progress Notes (Signed)
Quick Note:  Spoke with pt and notified of results per Dr. Wert. Pt verbalized understanding and denied any questions.  ______ 

## 2015-01-23 ENCOUNTER — Telehealth: Payer: Self-pay | Admitting: Nurse Practitioner

## 2015-01-23 DIAGNOSIS — R109 Unspecified abdominal pain: Secondary | ICD-10-CM

## 2015-01-23 NOTE — Assessment & Plan Note (Signed)
-   02 at hs since 2012 - still smoking - 11/20/2014 p extensive coaching HFA effectiveness =    0% -med calendar 12/18/2014  - PFTs 01/21/2015 wnl except dlco 39 correct to 54% - 01/21/2015  Walked RA x 1 lap @ 185 ft each stopped due to  Tired/ unsteady/ no sob or desat at nl pace Clearly has mc dysfunction but not enough copd to justify spiriva > d/c  I had an extended discussion with the patient reviewing all relevant studies completed to date and  lasting 15 to 20 minutes of a 25 minute visit on the following ongoing concerns:  1)  I reviewed the Fletcher curve with the patient that basically indicates  if you quit smoking when your best day FEV1 is still well preserved (as is clearly  the case here)  it is highly unlikely you will progress to severe disease and informed the patient there was no medication on the market that has proven to alter the curve/ its downward trajectory  or the likelihood of progression of their disease.  Therefore stopping smoking and maintaining abstinence is the most important aspect of care, not choice of inhalers or for that matter, doctors.     2)  Each maintenance medication was reviewed in detail including most importantly the difference between maintenance and as needed and under what circumstances the prns are to be used. This was done in the context of a medication calendar review which provided the patient with a user-friendly unambiguous mechanism for medication administration and reconciliation and provides an action plan for all active problems. It is critical that this be shown to every doctor  for modification during the office visit if necessary so the patient can use it as a working document.

## 2015-01-23 NOTE — Telephone Encounter (Signed)
Please advise 

## 2015-01-31 ENCOUNTER — Ambulatory Visit (INDEPENDENT_AMBULATORY_CARE_PROVIDER_SITE_OTHER): Payer: Commercial Managed Care - HMO | Admitting: Pharmacist

## 2015-01-31 DIAGNOSIS — K449 Diaphragmatic hernia without obstruction or gangrene: Secondary | ICD-10-CM

## 2015-01-31 DIAGNOSIS — I480 Paroxysmal atrial fibrillation: Secondary | ICD-10-CM

## 2015-01-31 DIAGNOSIS — K5792 Diverticulitis of intestine, part unspecified, without perforation or abscess without bleeding: Secondary | ICD-10-CM

## 2015-01-31 DIAGNOSIS — K529 Noninfective gastroenteritis and colitis, unspecified: Secondary | ICD-10-CM

## 2015-01-31 LAB — POCT INR: INR: 1.8

## 2015-01-31 NOTE — Patient Instructions (Signed)
Anticoagulation Dose Instructions as of 01/31/2015      [name] Tue Wed Thu Fri Sat   New Dose 1 mg 1 mg 1 mg 1 mg 1 mg 1 mg 1 mg    Description        Take 1 tablet of warfarin 2mg  today - Thursday, June 23rd. Then resume usual dose of 1/2 tablet daily.      INR was 1.8 today

## 2015-02-05 DIAGNOSIS — L84 Corns and callosities: Secondary | ICD-10-CM | POA: Diagnosis not present

## 2015-02-05 DIAGNOSIS — E1151 Type 2 diabetes mellitus with diabetic peripheral angiopathy without gangrene: Secondary | ICD-10-CM | POA: Diagnosis not present

## 2015-02-05 DIAGNOSIS — B351 Tinea unguium: Secondary | ICD-10-CM | POA: Diagnosis not present

## 2015-02-06 ENCOUNTER — Other Ambulatory Visit: Payer: Self-pay | Admitting: Pharmacist

## 2015-02-06 MED ORDER — DENOSUMAB 60 MG/ML ~~LOC~~ SOLN: SUBCUTANEOUS | Status: DC

## 2015-02-14 ENCOUNTER — Telehealth: Payer: Self-pay | Admitting: Nurse Practitioner

## 2015-02-14 DIAGNOSIS — F419 Anxiety disorder, unspecified: Secondary | ICD-10-CM

## 2015-02-14 MED ORDER — HYDROCODONE-ACETAMINOPHEN 5-325 MG PO TABS: 1.0000 | ORAL_TABLET | Freq: Three times a day (TID) | ORAL | Status: DC | PRN

## 2015-02-14 MED ORDER — CLONAZEPAM 0.5 MG PO TABS: 0.5000 mg | ORAL_TABLET | Freq: Three times a day (TID) | ORAL | Status: DC | PRN

## 2015-02-14 NOTE — Telephone Encounter (Signed)
Pain rx and klonopin rx ready for pick up

## 2015-02-14 NOTE — Telephone Encounter (Signed)
Pt notified of Rxs Verbalizes understanding

## 2015-02-18 ENCOUNTER — Telehealth: Payer: Self-pay | Admitting: Cardiology

## 2015-02-18 ENCOUNTER — Encounter: Payer: Commercial Managed Care - HMO | Admitting: *Deleted

## 2015-02-18 NOTE — Telephone Encounter (Signed)
LMOVM reminding pt to send remote transmission.   

## 2015-02-18 NOTE — Telephone Encounter (Signed)
Last phone note error.  Attempted to confirm remote transmission with pt. No answer and was unable to leave a message.

## 2015-02-19 ENCOUNTER — Encounter: Payer: Self-pay | Admitting: Cardiology

## 2015-02-26 ENCOUNTER — Other Ambulatory Visit: Payer: Self-pay | Admitting: Nurse Practitioner

## 2015-02-28 ENCOUNTER — Ambulatory Visit (INDEPENDENT_AMBULATORY_CARE_PROVIDER_SITE_OTHER): Payer: Commercial Managed Care - HMO | Admitting: Pharmacist

## 2015-02-28 ENCOUNTER — Other Ambulatory Visit: Payer: Self-pay | Admitting: Pharmacist

## 2015-02-28 DIAGNOSIS — I480 Paroxysmal atrial fibrillation: Secondary | ICD-10-CM | POA: Diagnosis not present

## 2015-02-28 DIAGNOSIS — R300 Dysuria: Secondary | ICD-10-CM | POA: Diagnosis not present

## 2015-02-28 LAB — POCT URINALYSIS DIPSTICK
Bilirubin, UA: NEGATIVE
Glucose, UA: NEGATIVE
Ketones, UA: NEGATIVE
Nitrite, UA: NEGATIVE
PROTEIN UA: NEGATIVE
RBC UA: NEGATIVE
Spec Grav, UA: 1.01
Urobilinogen, UA: NEGATIVE
pH, UA: 5

## 2015-02-28 LAB — POCT UA - MICROSCOPIC ONLY
Bacteria, U Microscopic: NEGATIVE
Casts, Ur, LPF, POC: NEGATIVE
Crystals, Ur, HPF, POC: NEGATIVE
Mucus, UA: NEGATIVE
RBC, URINE, MICROSCOPIC: NEGATIVE
Yeast, UA: NEGATIVE

## 2015-02-28 LAB — POCT INR: INR: 1.8

## 2015-02-28 MED ORDER — CIPROFLOXACIN HCL 500 MG PO TABS: 500.0000 mg | ORAL_TABLET | Freq: Two times a day (BID) | ORAL | Status: DC

## 2015-02-28 NOTE — Patient Instructions (Signed)
Anticoagulation Dose Instructions as of 02/28/2015      [name] Tue Wed Thu Fri Sat   New Dose 1 mg 1 mg 1 mg 1 mg 2 mg 1 mg 1 mg    Description        Increase warfarin 2mg  - take 1 tablet on Thursdays and 1/2 tablet all other days.       INR was 1.8 today

## 2015-03-01 ENCOUNTER — Other Ambulatory Visit: Payer: Self-pay | Admitting: Pharmacist

## 2015-03-01 ENCOUNTER — Telehealth: Payer: Self-pay | Admitting: Internal Medicine

## 2015-03-01 DIAGNOSIS — J449 Chronic obstructive pulmonary disease, unspecified: Secondary | ICD-10-CM

## 2015-03-01 MED ORDER — CIPROFLOXACIN HCL 500 MG PO TABS: 500.0000 mg | ORAL_TABLET | Freq: Two times a day (BID) | ORAL | Status: DC

## 2015-03-01 NOTE — Telephone Encounter (Signed)
lmomtcb x1 

## 2015-03-01 NOTE — Telephone Encounter (Signed)
We did not originally order it so ok to renew for now but what she'll need in ono RA so I can have the paperwork to back up the rec to stay on it (orignal rx x 4 years prior to my ov)

## 2015-03-01 NOTE — Telephone Encounter (Signed)
Per 01/21/15 OV; Pulmonary follow up is as needed --  Called spoke with pt. She reports she is eligible for a new concentrator for her night time O2.  She is requesting MW order this through [facility]. Please advise MW thanks

## 2015-03-02 LAB — URINE CULTURE

## 2015-03-04 NOTE — Telephone Encounter (Signed)
Patient notified. Order entered for [employer] entered for ONO Nothing further needed.

## 2015-03-06 ENCOUNTER — Telehealth: Payer: Self-pay | Admitting: Internal Medicine

## 2015-03-06 NOTE — Telephone Encounter (Signed)
Spoke with Destiny at [facility] and she is asking if we received orders to continue pt's oxygen therapy.  Magda Paganini , have you seen this paperwork?  It was sent over on 03/04/15.

## 2015-03-06 NOTE — Telephone Encounter (Signed)
Spoke with Destiny at [facility] and advised that at pt's last ov she did not qualify for walking sat so that is why we ordered a ONO to see if she still qualifies for nightime oxygen.  Dr wert wants to await ono results before he signs orders.  Destiny verbalized understanding.

## 2015-03-06 NOTE — Telephone Encounter (Signed)
Error.Stanley A Dalton ° °

## 2015-03-10 NOTE — Progress Notes (Signed)
REVIEWED-NO ADDITIONAL RECOMMENDATIONS. 

## 2015-03-11 ENCOUNTER — Ambulatory Visit (INDEPENDENT_AMBULATORY_CARE_PROVIDER_SITE_OTHER): Payer: Commercial Managed Care - HMO | Admitting: Pharmacist

## 2015-03-11 DIAGNOSIS — I480 Paroxysmal atrial fibrillation: Secondary | ICD-10-CM | POA: Diagnosis not present

## 2015-03-11 DIAGNOSIS — R300 Dysuria: Secondary | ICD-10-CM

## 2015-03-11 DIAGNOSIS — M810 Age-related osteoporosis without current pathological fracture: Secondary | ICD-10-CM

## 2015-03-11 LAB — POCT INR: INR: 2.1

## 2015-03-11 MED ORDER — DENOSUMAB 60 MG/ML ~~LOC~~ SOLN
60.0000 mg | Freq: Once | SUBCUTANEOUS | Status: AC
  Administered 2015-03-11: 60 mg via SUBCUTANEOUS

## 2015-03-11 NOTE — Patient Instructions (Signed)
Anticoagulation Dose Instructions as of 03/11/2015      [name] Tue Wed Thu Fri Sat   New Dose 1 mg 1 mg 1 mg 1 mg 2 mg 1 mg 1 mg    Description        Continue current warfarin 2mg  dose of 1 tablet on Thursdays and 1/2 tablet all other days.       INR was 2.1 today

## 2015-03-11 NOTE — Progress Notes (Signed)
Subjective:     Indication: atrial fibrillation Bleeding signs/symptoms: None Thromboembolic signs/symptoms: None  Missed Coumadin doses: None Medication changes: yes - took cipro for 7 days for UTI 03/01/2015 through 03/08/2015. Dietary changes: no Bacterial/viral infection: yes - UTI Other concerns: yes - patient is due to have Prolia administered in office today.  She has picked up from pharmacy and supplies medication her self.  Last DEXA was 11/01/2013 - next cure 10/2015  The following portions of the patient's history were reviewed and updated as appropriate: allergies, current medications, past family history, past medical history, past social history, past surgical history and problem list.   Objective:    INR Today: 2.1 Current dose: warfarin 2mg  - 1 tablet on thursdays and 1/2 tablet all other days.   Assessment:    Therapeutic INR for goal of 2-3  Osteoporosis, post menopausal UTI - no symptoms reported after completion of ABX therapy   Plan:    1. New dose: no change   2. Next INR: 1 month   3.  Prolia 60mg  administered SQ.  4.  Checked urine culture today   Cherre Robins, PharmD, CPP

## 2015-03-12 LAB — URINE CULTURE: ORGANISM ID, BACTERIA: NO GROWTH

## 2015-03-14 NOTE — Telephone Encounter (Signed)
done

## 2015-03-18 ENCOUNTER — Encounter: Payer: Self-pay | Admitting: Nurse Practitioner

## 2015-03-18 ENCOUNTER — Ambulatory Visit (INDEPENDENT_AMBULATORY_CARE_PROVIDER_SITE_OTHER): Payer: Commercial Managed Care - HMO | Admitting: Nurse Practitioner

## 2015-03-18 VITALS — BP 111/64 | HR 75 | Temp 97.9°F | Ht 62.0 in | Wt 150.0 lb

## 2015-03-18 DIAGNOSIS — J209 Acute bronchitis, unspecified: Secondary | ICD-10-CM | POA: Diagnosis not present

## 2015-03-18 MED ORDER — AZITHROMYCIN 250 MG PO TABS: ORAL_TABLET | ORAL | Status: DC

## 2015-03-18 MED ORDER — HYDROCODONE-ACETAMINOPHEN 5-325 MG PO TABS: 1.0000 | ORAL_TABLET | Freq: Three times a day (TID) | ORAL | Status: DC | PRN

## 2015-03-18 MED ORDER — BENZONATATE 100 MG PO CAPS
100.0000 mg | ORAL_CAPSULE | Freq: Three times a day (TID) | ORAL | Status: DC | PRN
Start: 2015-03-18 — End: 2015-05-27

## 2015-03-18 NOTE — Patient Instructions (Signed)

## 2015-03-18 NOTE — Addendum Note (Signed)
Addended by: [name] on: 03/18/2015 12:39 PM   Modules accepted: Orders

## 2015-03-18 NOTE — Progress Notes (Signed)
   Subjective:    Patient ID: [name], female    DOB: 1935-03-03, 80 y.o.   MRN: [id number]  HPI Patient in today c/o cough- chest feels tight when she coughs- started 2 weeks ago and has gotten worse.     Review of Systems  Constitutional: Negative for fever and chills.  HENT: Negative for congestion, ear pain, postnasal drip, rhinorrhea and sinus pressure.   Respiratory: Positive for cough and shortness of breath.   Cardiovascular: Negative.   Gastrointestinal: Negative.   Genitourinary: Negative.   Neurological: Negative.   Psychiatric/Behavioral: Negative.   All other systems reviewed and are negative.      Objective:   Physical Exam  Constitutional: She is oriented to person, place, and time. She appears well-developed and well-nourished.  Cardiovascular: Normal rate and regular rhythm.   Pulmonary/Chest: Effort normal. She has wheezes (expiratory throughout.).  Neurological: She is alert and oriented to person, place, and time.  Skin: Skin is warm.  Psychiatric: She has a normal mood and affect. Her behavior is normal. Judgment and thought content normal.    BP 111/64 mmHg  Pulse 75  Temp(Src) 97.9 F (36.6 C) (Oral)  Ht 5\' 2"  (1.575 m)  Wt 150 lb (68.04 kg)  BMI 27.43 kg/m2       Assessment & Plan:   1. Acute bronchitis, unspecified organism    Meds ordered this encounter  Medications  . azithromycin (ZITHROMAX Z-PAK) 250 MG tablet    Sig: As directed    Dispense:  1 each    Refill:  0    Order Specific Question:  Supervising Provider    Answer:  Chipper Herb [[id number]]  . benzonatate (TESSALON PERLES) 100 MG capsule    Sig: Take 1 capsule (100 mg total) by mouth 3 (three) times daily as needed for cough.    Dispense:  20 capsule    Refill:  0    Order Specific Question:  Supervising Provider    Answer:  Chipper Herb [[id number]]   1. Take meds as prescribed 2. Use a cool mist humidifier especially during the winter months and when heat has  been humid. 3. Use saline nose sprays frequently 4. Saline irrigations of the nose can be very helpful if done frequently.  * 4X daily for 1 week*  * Use of a nettie pot can be helpful with this. Follow directions with this* 5. Drink plenty of fluids 6. Keep thermostat turn down low 7.For any cough or congestion  Use plain Mucinex- regular strength or max strength is fine   * Children- consult with Pharmacist for dosing 8. For fever or aces or pains- take tylenol or ibuprofen appropriate for age and weight.  * for fevers greater than 101 orally you may alternate ibuprofen and tylenol every  3 hours.   [name], FNP

## 2015-03-19 ENCOUNTER — Other Ambulatory Visit: Payer: Self-pay | Admitting: Nurse Practitioner

## 2015-03-21 DIAGNOSIS — R109 Unspecified abdominal pain: Secondary | ICD-10-CM | POA: Diagnosis not present

## 2015-03-29 DIAGNOSIS — J449 Chronic obstructive pulmonary disease, unspecified: Secondary | ICD-10-CM | POA: Diagnosis not present

## 2015-04-01 ENCOUNTER — Other Ambulatory Visit: Payer: Self-pay | Admitting: Nurse Practitioner

## 2015-04-01 DIAGNOSIS — F419 Anxiety disorder, unspecified: Secondary | ICD-10-CM

## 2015-04-01 MED ORDER — CLONAZEPAM 0.5 MG PO TABS: 0.5000 mg | ORAL_TABLET | Freq: Three times a day (TID) | ORAL | Status: DC | PRN

## 2015-04-01 NOTE — Telephone Encounter (Signed)
Please call in clonazepam 0.5mg  1 po TID #90  with 0 refills

## 2015-04-01 NOTE — Telephone Encounter (Signed)
Refill called to [facility] in [place]

## 2015-04-02 DIAGNOSIS — R109 Unspecified abdominal pain: Secondary | ICD-10-CM | POA: Diagnosis not present

## 2015-04-04 ENCOUNTER — Encounter: Payer: Self-pay | Admitting: Internal Medicine

## 2015-04-04 DIAGNOSIS — G4734 Idiopathic sleep related nonobstructive alveolar hypoventilation: Secondary | ICD-10-CM | POA: Insufficient documentation

## 2015-04-09 DIAGNOSIS — J449 Chronic obstructive pulmonary disease, unspecified: Secondary | ICD-10-CM | POA: Diagnosis not present

## 2015-04-09 DIAGNOSIS — J188 Other pneumonia, unspecified organism: Secondary | ICD-10-CM | POA: Diagnosis not present

## 2015-04-11 ENCOUNTER — Encounter: Payer: Self-pay | Admitting: Cardiovascular Disease

## 2015-04-11 ENCOUNTER — Ambulatory Visit (INDEPENDENT_AMBULATORY_CARE_PROVIDER_SITE_OTHER): Payer: Commercial Managed Care - HMO | Admitting: Cardiovascular Disease

## 2015-04-11 ENCOUNTER — Telehealth: Payer: Self-pay | Admitting: Nurse Practitioner

## 2015-04-11 VITALS — BP 138/68 | HR 63 | Ht 62.0 in | Wt 144.8 lb

## 2015-04-11 DIAGNOSIS — I350 Nonrheumatic aortic (valve) stenosis: Secondary | ICD-10-CM

## 2015-04-11 DIAGNOSIS — I4819 Other persistent atrial fibrillation: Secondary | ICD-10-CM

## 2015-04-11 DIAGNOSIS — I2510 Atherosclerotic heart disease of native coronary artery without angina pectoris: Secondary | ICD-10-CM

## 2015-04-11 DIAGNOSIS — I481 Persistent atrial fibrillation: Secondary | ICD-10-CM

## 2015-04-11 DIAGNOSIS — I5032 Chronic diastolic (congestive) heart failure: Secondary | ICD-10-CM | POA: Diagnosis not present

## 2015-04-11 DIAGNOSIS — I739 Peripheral vascular disease, unspecified: Secondary | ICD-10-CM

## 2015-04-11 DIAGNOSIS — Z950 Presence of cardiac pacemaker: Secondary | ICD-10-CM | POA: Diagnosis not present

## 2015-04-11 DIAGNOSIS — Z720 Tobacco use: Secondary | ICD-10-CM

## 2015-04-11 DIAGNOSIS — I779 Disorder of arteries and arterioles, unspecified: Secondary | ICD-10-CM

## 2015-04-11 DIAGNOSIS — I495 Sick sinus syndrome: Secondary | ICD-10-CM

## 2015-04-11 NOTE — Patient Instructions (Signed)
Your physician wants you to follow-up in: 6 months You will receive a reminder letter in the mail two months in advance. If you don't receive a letter, please call our office to schedule the follow-up appointment.     Your physician recommends that you continue on your current medications as directed. Please refer to the Current Medication list given to you today.      Thank you for choosing [facility] !        

## 2015-04-11 NOTE — Progress Notes (Signed)
Patient ID: [name], female   DOB: 23-Feb-1935, 80 y.o.   MRN: [id number]      SUBJECTIVE: The patient is here for routine cardiovascular follow up. She has COPD, chronic diastolic heart failure, permanent atrial fibrillation/flutter and has a pacemaker.   Device interrogation on 11/16/14 demonstrated normal device function with 1 high ventricular rate.  She was previoulsy taken off of metoprolol by Dr. Rayann Heman given her significant COPD with wheezing.  She has not tolerated higher doses of diltiazem in the past as it led to low blood pressures.  She also has a h/o mild nonobstructive CAD and a previous tachycardia-induced cardiomyopathy which has since normalized in function. Coronary angiography on 04/04/2010 demonstrated a 40% proximal LAD lesion, 20% mid left circumflex coronary artery lesion, and the RCA had a 20% proximal, 50-60% mid vessel, and distal 30% stenosis.  Echocardiogram on 10/09/14 demonstrated normal left ventricular systolic function, EF [id number], diastolic dysfunction, high filling pressures, mild LVH, mild aortic stenosis, mild mitral regurgitation, severe left atrial enlargement, moderate right atrial enlargement, and mildly reduced right ventricular systolic function with mild tricuspid regurgitation and mildly elevated pulmonary pressures.  Currently denies chest pain. No change in baseline exertional dyspnea. Primary complaints relate to abdominal pain and distention after eating. Sees GI.   Review of Systems: As per "subjective", otherwise negative.  Allergies  Allergen Reactions  . Torsemide Nausea And Vomiting  . Macrobid [Nitrofurantoin Monohyd Macro] Rash  . Tramadol Nausea Only    Current Outpatient Prescriptions  Medication Sig Dispense Refill  . albuterol (VENTOLIN HFA) 108 (90 BASE) MCG/ACT inhaler Inhale 1 to 2 puffs into lung every 4 to 6 hours as needed for SOB / wheezing (Patient taking differently: Inhale 2 puffs into the lungs every 4 (four)  hours as needed (plan B). ) 3 Inhaler 1  . benzonatate (TESSALON PERLES) 100 MG capsule Take 1 capsule (100 mg total) by mouth 3 (three) times daily as needed for cough. 20 capsule 0  . Blood Glucose Calibration (TRUE METRIX LEVEL 1) LOW SOLN Use weekly 3 each 1  . Blood Glucose Monitoring Suppl (TRUE METRIX AIR GLUCOSE METER) DEVI 1 Device by Does not apply route 2 (two) times daily. Use to Test blood sugar bid. DX E13.10 1 Device 0  . clonazePAM (KLONOPIN) 0.5 MG tablet Take 1 tablet (0.5 mg total) by mouth 3 (three) times daily as needed for anxiety. 90 tablet 0  . denosumab (PROLIA) 60 MG/ML SOLN injection INJECT 60 MG SQ every 6 months. 1 mL 0  . diltiazem (CARDIZEM) 120 MG tablet Take 1 tablet (120 mg total) by mouth daily. 90 tablet 1  . furosemide (LASIX) 40 MG tablet Take 1 tablet (40 mg total) by mouth daily. 90 tablet 1  . glucose blood test strip True metrix test strips. Test bid. E13.10 300 each 1  . HYDROcodone-acetaminophen (NORCO/VICODIN) 5-325 MG per tablet Take 1 tablet by mouth 3 (three) times daily as needed for moderate pain. 90 tablet 0  . ipratropium-albuterol (DUONEB) 0.5-2.5 (3) MG/3ML SOLN Take 3 mLs by nebulization every 4 (four) hours as needed. Plan C    . JANUVIA 50 MG tablet TAKE 1 TABLET (50 MG TOTAL) BY MOUTH DAILY. 90 tablet 0  . lipase/protease/amylase (CREON) 36000 UNITS CPEP capsule 3 PO WITH MEALS AND 1 WITH SNACK (Patient taking differently: 3 capsules every AM, at noon and PM) 540 capsule 1  . nitroGLYCERIN (NITROSTAT) 0.4 MG SL tablet Place 1 tablet (0.4 mg total) under the tongue  every 5 (five) minutes as needed for chest pain. 25 tablet 3  . pantoprazole (PROTONIX) 40 MG tablet Take 1 tablet (40 mg total) by mouth 2 (two) times daily. (Patient taking differently: Take 40 mg by mouth 2 (two) times daily before a meal. ) 180 tablet 1  . potassium chloride SA (K-DUR,KLOR-CON) 20 MEQ tablet Take 1 tablet (20 mEq total) by mouth every morning. 30 tablet 5  .  Probiotic Product ([place]) CAPS Take 1 capsule by mouth daily.    . simvastatin (ZOCOR) 10 MG tablet Take 1 tablet (10 mg total) by mouth daily. 30 tablet 5  . SYMBICORT 160-4.5 MCG/ACT inhaler INHALE 2 PUFFS TWICE DAILY 3 Inhaler 1  . TRUEPLUS LANCETS 28G MISC Test Blood sugar bid. DX E13.10 300 each 1  . warfarin (COUMADIN) 2 MG tablet TAKE PER PHYSICIAN INSTRUCTIONS 135 tablet 1   No current facility-administered medications for this visit.    Past Medical History  Diagnosis Date  . Permanent atrial fibrillation     H/o tachybradycardia syndrome on Coumadin anticoagulation, has pacemaker.  [name] CAD (coronary artery disease) 2011    Catheterization August 2011: mild nonobstructive coronary artery disease complicated by large left rectus sheath hematoma status post evacuation Coumadin restarted without recurrence  . Diabetes mellitus   . Chronic airway obstruction, not elsewhere classified   . Anemia, unspecified   . Chronic kidney disease, stage II (mild)   . Unspecified hypertensive kidney disease with chronic kidney disease stage I through stage IV, or unspecified   . Tachycardia-bradycardia     s/p St. Jude single chamber PPM 10/2010   . GERD (gastroesophageal reflux disease)   . Abnormal CT of the chest     Mediastinal and hilar adenopathy followed by Dr. Koleen Nimrod in [place]  . Congestive heart failure, unspecified     EF now 60%, previous nonischemic cardiomyopathy  . Pacemaker     Single chamber [place]. Jude ACCENT [phone] SN 719 04/11/1959 10/31/2010  . Valvular heart disease     a. H/o moderate mitral stenosis by cath 2011 but no mention of this on 10/2011 echo. b. 10/2011 echo: mod MR, mild AS, mild TR; EF>65%  . Hypercholesterolemia   . Diverticulitis Dec 2012  . Body mass index (BMI) of 20.0-20.9 in adult FEB 2013 147 LBS  . Recurrent UTI   . Neurogenic sleep apnea     Uses noctural oxygen prescribed by her pulmonologi  . Rheumatic heart disease   . Gastritis      By EGD  . Carotid artery disease     Doppler, 05/2012, 0-39% bilateral  . Vitamin D deficiency   . Dilated bile duct 10/19/2011  . Mitral regurgitation 06/27/2013  . Cataract     Past Surgical History  Procedure Laterality Date  . Single-chamber pacemaker implantation  3/12    by Dr Caryl Comes  . Evacuation of retroperitoneal and left rectus sheath    . Cholecystectomy      40+ years ago  . Hernia repair      X3  . Pilonidal cyst / sinus excision    . Esophagogastroduodenoscopy  10/2011    Fields-erosive gastritis, biopsy with no H. pylori, hiatal hernia, peptic duodenitis, no celiac disease, duodenal diverticulum, duodenal nodule  . Colonoscopy  10/2011    Dyann Ruddle sigmoid to descending diverticulosis, internal hemorrhoids  . Eus  11/16/11    Mishra-13 MM CBD, normal pancreatic duct, otherwise normal EUS  . Hematoma evacuation  femoral    Social History   Social History  . Marital Status: Single    Spouse Name: N/A  . Number of Children: 2  . Years of Education: N/A   Occupational History  . RETIRED    Social History Main Topics  . Smoking status: Current Every Day Smoker -- 0.25 packs/day for 45 years    Types: Cigarettes    Start date: 02/05/1958  . Smokeless tobacco: Never Used     Comment: smokes 6-7cigarettes daily  . Alcohol Use: No  . Drug Use: No  . Sexual Activity: Not Currently   Other Topics Concern  . Not on file   Social History Narrative     Filed Vitals:   04/11/15 1513  BP: 138/68  Pulse: 63  Height: 5\' 2"  (1.575 m)  Weight: 144 lb 12.8 oz (65.681 kg)  SpO2: 93%    PHYSICAL EXAM General: NAD HEENT: Poor dentition.  Neck: No JVD, no thyromegaly. Lungs: Prolonged expiratory phase with end-expiratory wheezes, no rales, poor air entry. CV: Nondisplaced PMI. Regular rate and rhythm, normal S1/S2, no S3/S4, II/VI ejection systolic murmur heard at RUSB and LUSB. No pretibial or periankle edema. Chronic stasis dermatitis. Abdomen:  Soft, nontender, no distention.  Neurologic: Alert and oriented.  Psych: Normal affect. Skin: Normal. Musculoskeletal: No gross deformities. Extremities: No clubbing or cyanosis.   ECG: Most recent ECG reviewed.      ASSESSMENT AND PLAN: 1. Permanent atrial fibrillation s/p pacemaker: Continue long-acting diltiazem 120 mg. I am not inclined to reintroduce metoprolol in the future should palpitations recur given her severe COPD. Device interrogation on 11/16/14 demonstrated normal device function with 1 high ventricular rate. Continue warfarin. No bleeding complications noted.   2. Valvular heart disease: Mild aortic stenosis and mitral and tricuspid regurgitation as noted above. Will monitor.  3. Carotid artery stenosis: Mild bilaterally in October 2013.   4. Chronic diastolic heart failure: Euvolemic. No changes to therapy.  5. Tobacco abuse: Cessation counseling previously provided.  6. Type II diabetes: On Januvia.   7. COPD: Not decompensated at present.  8. CAD: No symptoms at present. Continue SL nitroglycerin to be used prn.  Dispo: f/u 6 months with NP/PA.   Kate Sable, M.D., F.A.C.C.

## 2015-04-11 NOTE — Telephone Encounter (Signed)
No have him send me a message

## 2015-04-12 ENCOUNTER — Encounter: Payer: Commercial Managed Care - HMO | Admitting: Internal Medicine

## 2015-04-16 ENCOUNTER — Other Ambulatory Visit: Payer: Self-pay | Admitting: Nurse Practitioner

## 2015-04-16 ENCOUNTER — Other Ambulatory Visit: Payer: Self-pay | Admitting: Family Medicine

## 2015-04-16 ENCOUNTER — Ambulatory Visit (INDEPENDENT_AMBULATORY_CARE_PROVIDER_SITE_OTHER): Payer: Commercial Managed Care - HMO | Admitting: Pharmacist Clinician (PhC)/ Clinical Pharmacy Specialist

## 2015-04-16 DIAGNOSIS — D508 Other iron deficiency anemias: Secondary | ICD-10-CM

## 2015-04-16 DIAGNOSIS — I480 Paroxysmal atrial fibrillation: Secondary | ICD-10-CM

## 2015-04-16 DIAGNOSIS — K219 Gastro-esophageal reflux disease without esophagitis: Secondary | ICD-10-CM

## 2015-04-16 LAB — POCT INR: INR: 1.9

## 2015-04-16 LAB — POCT HEMOGLOBIN: HEMOGLOBIN: 10.4 g/dL — AB (ref 12.2–16.2)

## 2015-04-16 MED ORDER — HYDROCODONE-ACETAMINOPHEN 5-325 MG PO TABS: 1.0000 | ORAL_TABLET | Freq: Three times a day (TID) | ORAL | Status: DC | PRN

## 2015-04-16 NOTE — Progress Notes (Signed)
Patient feeling weaker today, hemoglobin has dropped to 10.4.  Patient had stopped taking her iron but will resume taking it today.  [name] is referring patient to GI doctor.

## 2015-04-17 ENCOUNTER — Telehealth: Payer: Self-pay | Admitting: Nurse Practitioner

## 2015-04-17 ENCOUNTER — Telehealth: Payer: Self-pay | Admitting: Gastroenterology

## 2015-04-17 NOTE — Telephone Encounter (Signed)
9/7 Pt is wishing to transfer care to [facility].  Review telephone notes and office notes from [facility] and advise on acceptance.

## 2015-04-17 NOTE — Telephone Encounter (Signed)
Should be ok to do without if cant afford to get but it may make her feel bad and cause nausea

## 2015-04-18 NOTE — Telephone Encounter (Signed)
Notes were reviewed.  I don't think we can offer her anything different than what she is receiving at [facility].

## 2015-04-22 ENCOUNTER — Telehealth: Payer: Self-pay | Admitting: Nurse Practitioner

## 2015-04-22 NOTE — Telephone Encounter (Signed)
Was she seeing them for nausea?

## 2015-04-23 DIAGNOSIS — E1151 Type 2 diabetes mellitus with diabetic peripheral angiopathy without gangrene: Secondary | ICD-10-CM | POA: Diagnosis not present

## 2015-04-23 DIAGNOSIS — L84 Corns and callosities: Secondary | ICD-10-CM | POA: Diagnosis not present

## 2015-04-23 DIAGNOSIS — B351 Tinea unguium: Secondary | ICD-10-CM | POA: Diagnosis not present

## 2015-04-23 NOTE — Telephone Encounter (Signed)
[name] you have been to 3 different GI and they cannot find anything wrong with all test that have been run- no one else will see you.

## 2015-04-23 NOTE — Telephone Encounter (Signed)
Patient states that the GI didn't even see her and didn't think he could do anything for her. He was seeing him for the abdominal pain

## 2015-04-25 ENCOUNTER — Other Ambulatory Visit: Payer: Self-pay | Admitting: Nurse Practitioner

## 2015-04-25 NOTE — Telephone Encounter (Signed)
Pt aware that we have exhausted all means for her.

## 2015-04-30 ENCOUNTER — Encounter: Payer: Self-pay | Admitting: Internal Medicine

## 2015-05-01 ENCOUNTER — Telehealth: Payer: Self-pay | Admitting: Nurse Practitioner

## 2015-05-01 DIAGNOSIS — F419 Anxiety disorder, unspecified: Secondary | ICD-10-CM

## 2015-05-01 NOTE — Telephone Encounter (Signed)
Last filled 04/01/15, must be called in, route to pool.

## 2015-05-01 NOTE — Telephone Encounter (Signed)
I saw this late, tried to call her back to let her know if would be tomorrow before we could get her the medicines but had no answer. Sorry about that. Arbie Cookey

## 2015-05-02 ENCOUNTER — Other Ambulatory Visit: Payer: Self-pay | Admitting: Nurse Practitioner

## 2015-05-02 ENCOUNTER — Ambulatory Visit: Payer: Commercial Managed Care - HMO | Admitting: Cardiovascular Disease

## 2015-05-02 MED ORDER — CLONAZEPAM 0.5 MG PO TABS
0.5000 mg | ORAL_TABLET | Freq: Three times a day (TID) | ORAL | Status: DC | PRN
Start: 2015-05-02 — End: 2015-05-31

## 2015-05-02 NOTE — Telephone Encounter (Signed)
rx called into pharmacy

## 2015-05-02 NOTE — Telephone Encounter (Signed)
Please call in klonopin 0.5 1 po TID #90  with 1 refills

## 2015-05-10 DIAGNOSIS — J449 Chronic obstructive pulmonary disease, unspecified: Secondary | ICD-10-CM | POA: Diagnosis not present

## 2015-05-10 DIAGNOSIS — J188 Other pneumonia, unspecified organism: Secondary | ICD-10-CM | POA: Diagnosis not present

## 2015-05-16 ENCOUNTER — Telehealth: Payer: Self-pay | Admitting: Nurse Practitioner

## 2015-05-16 MED ORDER — HYDROCODONE-ACETAMINOPHEN 5-325 MG PO TABS: 1.0000 | ORAL_TABLET | Freq: Three times a day (TID) | ORAL | Status: DC | PRN

## 2015-05-16 NOTE — Telephone Encounter (Signed)
[facility] rx ready for pick up  

## 2015-05-16 NOTE — Telephone Encounter (Signed)
Patient notified rx up front and ready for pick up 

## 2015-05-17 ENCOUNTER — Ambulatory Visit (INDEPENDENT_AMBULATORY_CARE_PROVIDER_SITE_OTHER): Payer: Commercial Managed Care - HMO | Admitting: Internal Medicine

## 2015-05-17 ENCOUNTER — Encounter: Payer: Self-pay | Admitting: Internal Medicine

## 2015-05-17 VITALS — BP 114/69 | HR 63 | Ht 62.0 in | Wt 147.0 lb

## 2015-05-17 DIAGNOSIS — I480 Paroxysmal atrial fibrillation: Secondary | ICD-10-CM

## 2015-05-17 DIAGNOSIS — I482 Chronic atrial fibrillation: Secondary | ICD-10-CM | POA: Diagnosis not present

## 2015-05-17 DIAGNOSIS — Z950 Presence of cardiac pacemaker: Secondary | ICD-10-CM

## 2015-05-17 DIAGNOSIS — I495 Sick sinus syndrome: Secondary | ICD-10-CM

## 2015-05-17 DIAGNOSIS — I10 Essential (primary) hypertension: Secondary | ICD-10-CM | POA: Diagnosis not present

## 2015-05-17 DIAGNOSIS — I4821 Permanent atrial fibrillation: Secondary | ICD-10-CM

## 2015-05-17 LAB — CUP PACEART INCLINIC DEVICE CHECK
Battery Remaining Longevity: 154.8 mo
Lead Channel Pacing Threshold Amplitude: 0.75 V
Lead Channel Pacing Threshold Pulse Width: 0.5 ms
Lead Channel Sensing Intrinsic Amplitude: 12 mV
Lead Channel Setting Sensing Sensitivity: 2 mV
MDC IDC MSMT BATTERY VOLTAGE: 2.98 V
MDC IDC MSMT LEADCHNL RV IMPEDANCE VALUE: 787.5 Ohm
MDC IDC PG SERIAL: 7199163
MDC IDC SESS DTM: 20161007163602
MDC IDC SET LEADCHNL RV PACING AMPLITUDE: 2.5 V
MDC IDC SET LEADCHNL RV PACING PULSEWIDTH: 0.5 ms
MDC IDC STAT BRADY RV PERCENT PACED: 12 %
Pulse Gen Model: 1110

## 2015-05-17 NOTE — Patient Instructions (Signed)
Your physician recommends that you continue on your current medications as directed. Please refer to the Current Medication list given to you today. Device check on 08/19/15. Your physician recommends that you schedule a follow-up appointment in 1 year with Dr. Rayann Heman. You will receive a reminder letter in the mail in about 10 months reminding you to call and schedule your appointment. If you don't receive this letter, please contact our office. You may also schedule this appointment today.

## 2015-05-17 NOTE — Progress Notes (Signed)
Primary Cardiologist:  Dr Bronson Ing PCP:  Dr Quillian Quince  The patient presents today for routine electrophysiology followup. She appears stable. She is chronically debilitated with multiple comorbidities.  Her breathing seems a little better.  She is mostly concerned about chronic GI issues presently. She has been seen by multiple GI docs.  She continues to smoke but is trying to quit. Today, she denies symptoms of palpitations, chest pain, dizziness, presyncope, syncope, or neurologic sequela. The patient feels that she is tolerating medications without difficulties and is otherwise without complaint today.    Past Medical History  Diagnosis Date  . Permanent atrial fibrillation (HCC)     H/o tachybradycardia syndrome on Coumadin anticoagulation, has pacemaker.  [name] CAD (coronary artery disease) 2011    Catheterization August 2011: mild nonobstructive coronary artery disease complicated by large left rectus sheath hematoma status post evacuation Coumadin restarted without recurrence  . Diabetes mellitus   . Chronic airway obstruction, not elsewhere classified   . Anemia, unspecified   . Chronic kidney disease, stage II (mild)   . Unspecified hypertensive kidney disease with chronic kidney disease stage I through stage IV, or unspecified   . Tachycardia-bradycardia [facility])     s/p St. Jude single chamber PPM 10/2010   . GERD (gastroesophageal reflux disease)   . Abnormal CT of the chest     Mediastinal and hilar adenopathy followed by Dr. Koleen Nimrod in [place]  . Congestive heart failure, unspecified     EF now 60%, previous nonischemic cardiomyopathy  . Pacemaker     Single chamber Harold. Jude ACCENT [phone] SN 719 04/[birth year]/1959 10/31/2010  . Valvular heart disease     a. H/o moderate mitral stenosis by cath 2011 but no mention of this on 10/2011 echo. b. 10/2011 echo: mod MR, mild AS, mild TR; EF>65%  . Hypercholesterolemia   . Diverticulitis Dec 2012  . Body mass index (BMI) of 20.0-20.9 in adult FEB 2013  147 LBS  . Recurrent UTI   . Neurogenic sleep apnea     Uses noctural oxygen prescribed by her pulmonologi  . Rheumatic heart disease   . Gastritis     By EGD  . Carotid artery disease ([place])     Doppler, 05/2012, 0-39% bilateral  . Vitamin D deficiency   . Dilated bile duct 10/19/2011  . Mitral regurgitation 06/27/2013  . Cataract    Past Surgical History  Procedure Laterality Date  . Single-chamber pacemaker implantation  3/12    by Dr Caryl Comes  . Evacuation of retroperitoneal and left rectus sheath    . Cholecystectomy      40+ years ago  . Hernia repair      X3  . Pilonidal cyst / sinus excision    . Esophagogastroduodenoscopy  10/2011    Fields-erosive gastritis, biopsy with no H. pylori, hiatal hernia, peptic duodenitis, no celiac disease, duodenal diverticulum, duodenal nodule  . Colonoscopy  10/2011    Dyann Ruddle sigmoid to descending diverticulosis, internal hemorrhoids  . Eus  11/16/11    Mishra-13 MM CBD, normal pancreatic duct, otherwise normal EUS  . Hematoma evacuation      femoral    Current Outpatient Prescriptions  Medication Sig Dispense Refill  . albuterol (VENTOLIN HFA) 108 (90 BASE) MCG/ACT inhaler Inhale 1 to 2 puffs into lung every 4 to 6 hours as needed for SOB / wheezing (Patient taking differently: Inhale 2 puffs into the lungs every 4 (four) hours as needed (plan B). ) 3 Inhaler 1  . benzonatate (TESSALON  PERLES) 100 MG capsule Take 1 capsule (100 mg total) by mouth 3 (three) times daily as needed for cough. 20 capsule 0  . Blood Glucose Calibration (TRUE METRIX LEVEL 1) LOW SOLN Use weekly 3 each 1  . Blood Glucose Monitoring Suppl (TRUE METRIX AIR GLUCOSE METER) DEVI 1 Device by Does not apply route 2 (two) times daily. Use to Test blood sugar bid. DX E13.10 1 Device 0  . clonazePAM (KLONOPIN) 0.5 MG tablet Take 1 tablet (0.5 mg total) by mouth 3 (three) times daily as needed for anxiety. 90 tablet 0  . CREON 36000 UNITS CPEP capsule TAKE 3 CAPSULES  WITH MEALS  AND TAKE 1 CAPSULE WITH A SNACK AS DIRECTED 1000 capsule 0  . denosumab (PROLIA) 60 MG/ML SOLN injection INJECT 60 MG SQ every 6 months. 1 mL 0  . diltiazem (CARDIZEM) 120 MG tablet TAKE 1 TABLET EVERY DAY 90 tablet 0  . furosemide (LASIX) 40 MG tablet Take 1 tablet (40 mg total) by mouth daily. 90 tablet 1  . glucose blood test strip True metrix test strips. Test bid. E13.10 300 each 1  . HYDROcodone-acetaminophen (NORCO/VICODIN) 5-325 MG tablet Take 1 tablet by mouth 3 (three) times daily as needed for moderate pain. 90 tablet 0  . ipratropium-albuterol (DUONEB) 0.5-2.5 (3) MG/3ML SOLN Take 3 mLs by nebulization every 4 (four) hours as needed. Plan C    . JANUVIA 50 MG tablet TAKE 1 TABLET (50 MG TOTAL) BY MOUTH DAILY. 90 tablet 0  . nitroGLYCERIN (NITROSTAT) 0.4 MG SL tablet Place 1 tablet (0.4 mg total) under the tongue every 5 (five) minutes as needed for chest pain. 25 tablet 3  . pantoprazole (PROTONIX) 40 MG tablet Take 1 tablet (40 mg total) by mouth 2 (two) times daily. (Patient taking differently: Take 40 mg by mouth 2 (two) times daily before a meal. ) 180 tablet 1  . potassium chloride SA (K-DUR,KLOR-CON) 20 MEQ tablet Take 1 tablet (20 mEq total) by mouth every morning. 30 tablet 5  . Probiotic Product ([place]) CAPS Take 1 capsule by mouth daily.    . simvastatin (ZOCOR) 10 MG tablet Take 1 tablet (10 mg total) by mouth daily. 30 tablet 5  . SYMBICORT 160-4.5 MCG/ACT inhaler INHALE 2 PUFFS TWICE DAILY 3 Inhaler 0  . TRUEPLUS LANCETS 28G MISC Test Blood sugar bid. DX E13.10 300 each 1  . warfarin (COUMADIN) 2 MG tablet TAKE PER PHYSICIAN INSTRUCTIONS 135 tablet 1   No current facility-administered medications for this visit.    Allergies  Allergen Reactions  . Torsemide Nausea And Vomiting  . Macrobid [Nitrofurantoin Monohyd Macro] Rash  . Tramadol Nausea Only    Social History   Social History  . Marital Status: Single    Spouse Name: N/A  .  Number of Children: 2  . Years of Education: N/A   Occupational History  . RETIRED    Social History Main Topics  . Smoking status: Current Every Day Smoker -- 0.25 packs/day for 45 years    Types: Cigarettes    Start date: 02/05/1958  . Smokeless tobacco: Never Used     Comment: smokes 6-7cigarettes daily  . Alcohol Use: No  . Drug Use: No  . Sexual Activity: Not Currently   Other Topics Concern  . Not on file   Social History Narrative    Family History  Problem Relation Age of Onset  . Cancer Mother     cervical  . Stroke Mother   .  Cancer Daughter 73    kidney, lung  . Heart disease Father   . Heart attack Father   . Ulcers Father   . Colon cancer Neg Hx   . Early death Sister 1    died at 36 months old  . Hypertension Brother   . Cancer Paternal Aunt     breast  . Hypertension Son     Physical Exam: Filed Vitals:   05/17/15 1512  BP: 114/69  Pulse: 63  Height: 5\' 2"  (1.575 m)  Weight: 147 lb (66.679 kg)  SpO2: 100%    GEN- The patient is elderly appearing, alert and oriented x 3 today.   Head- normocephalic, atraumatic Eyes-  Sclera clear, conjunctiva pink Ears- hearing intact Oropharynx- clear Neck- supple, no JVP Lymph- no cervical lymphadenopathy Lungs- Clear to ausculation bilaterally, normal work of breathing Chest- pacemaker pocket is well healed Heart- Regular rate and rhythm, 2/6 SEM LSB GI- soft, NT, ND, + BS Extremities- no clubbing, cyanosis, 1+edema  Pacemaker interrogation- reviewed in detail today,  See PACEART report  Assessment and Plan:  1. Permanent afib  She is on coumadin and tolerating this well  chads2vasc score is at least 4   2. Tachy/brady syndrome Normal pacemaker function  See Pace Art report  No changes today   3. Chronic venous insufficiency  Improved   4. Tachy/brady syndrome  Stable No change required today  5. Tobacco  I have strongly encouraged smoking cessation  She is working on it (currently 5  cigs per day)  Merlin  Return in [place] MD, [facility] 05/17/2015 3:42 PM

## 2015-05-23 ENCOUNTER — Ambulatory Visit (INDEPENDENT_AMBULATORY_CARE_PROVIDER_SITE_OTHER): Payer: Commercial Managed Care - HMO | Admitting: Pediatrics

## 2015-05-23 ENCOUNTER — Other Ambulatory Visit: Payer: Self-pay | Admitting: Nurse Practitioner

## 2015-05-23 ENCOUNTER — Encounter: Payer: Self-pay | Admitting: Pediatrics

## 2015-05-23 VITALS — BP 108/62 | HR 60 | Temp 98.3°F | Ht 62.0 in | Wt 147.0 lb

## 2015-05-23 DIAGNOSIS — I10 Essential (primary) hypertension: Secondary | ICD-10-CM | POA: Diagnosis not present

## 2015-05-23 DIAGNOSIS — I482 Chronic atrial fibrillation: Secondary | ICD-10-CM

## 2015-05-23 DIAGNOSIS — Z23 Encounter for immunization: Secondary | ICD-10-CM | POA: Diagnosis not present

## 2015-05-23 DIAGNOSIS — F17210 Nicotine dependence, cigarettes, uncomplicated: Secondary | ICD-10-CM

## 2015-05-23 DIAGNOSIS — J449 Chronic obstructive pulmonary disease, unspecified: Secondary | ICD-10-CM | POA: Diagnosis not present

## 2015-05-23 DIAGNOSIS — I495 Sick sinus syndrome: Secondary | ICD-10-CM

## 2015-05-23 DIAGNOSIS — Z720 Tobacco use: Secondary | ICD-10-CM | POA: Diagnosis not present

## 2015-05-23 DIAGNOSIS — I4821 Permanent atrial fibrillation: Secondary | ICD-10-CM

## 2015-05-23 NOTE — Assessment & Plan Note (Signed)
No recent symptoms. On warfarin. Next INR check next week.

## 2015-05-23 NOTE — Assessment & Plan Note (Signed)
Well controlled with current meds. Last labs 5 months ago. Continue medications.

## 2015-05-23 NOTE — Assessment & Plan Note (Signed)
Has a pacemaker, recently checked by cardiology.

## 2015-05-23 NOTE — Assessment & Plan Note (Signed)
Smokes apprx 5-6 cigarettes. On oxygen at home. Breathing is doing fine, no complaints. Continue current inhalers.

## 2015-05-23 NOTE — Progress Notes (Signed)
Subjective:    Patient ID: [name], female    DOB: [birth year], 80 y.o.   MRN: [id number]  CC: follow up multiple medical problems  HPI: [name] is a 80 y.o. female presenting on 05/23/2015 for follow up health care maintenance and multiple med problems.  Still on oxygen at night Trying to get appt with [facility] for second opinion re abd pain Has diarrhea 2-3 days, has to go 3-4 times in a day, then goes away with normal stooling for several days. Has indigestion most nights, takes maalox No constipation Takes protonix daily Blood sugars doing fine Last mammogram within the past year. Does want to keep going with mammograms. No history of abnormal pap smears Patient declined further pap smears No blood in bowel movements Last colonoscopy 5 yrs ago Maternal grandmother had uterine cancer Mom had uterine cancer Daughter had kidney cancer Due for flu shot Due for pneumoncoccal shot 23 in Nov Due for eye doctor appt, pt is going to call Last fall one year ago, tripped voer the dog. Usually uses cane or walker now   Relevant past medical, surgical, family and social history reviewed and updated as indicated. Interim medical history since our last visit reviewed. Allergies and medications reviewed and updated.   ROS: All systems negative other than what is in HPI  Past Medical History Patient Active Problem List   Diagnosis Date Noted  . Nocturnal hypoxemia 04/04/2015  . Hyperlipidemia with target LDL less than 100 12/31/2014  . Cigarette smoker 11/21/2014  . Osteoporosis, post-menopausal 11/01/2013  . GERD (gastroesophageal reflux disease) 07/13/2013  . Diarrhea 07/13/2013  . Aortic stenosis 06/27/2013  . Acute on chronic diastolic CHF (congestive heart failure) ([place]) 06/27/2013  . Vitamin D deficiency   . Tobacco abuse 05/12/2012  . Abdominal wall hernia 10/19/2011  . CAD (coronary artery disease)   . Pacemaker-St.Jude   . Tachycardia-bradycardia  syndrome ([place]) 12/03/2010  . Chronic anticoagulation 12/03/2010  . Anxiety 12/03/2010  . Mitral stenosis 05/30/2010  . Anemia 05/16/2010  . DM (diabetes mellitus) type 2, uncontrolled, with ketoacidosis ([place]) 01/19/2009  . Essential hypertension 01/19/2009  . Permanent atrial fibrillation ([place]) 01/19/2009  . COPD GOLD O  01/19/2009  . HEART MURMUR, BENIGN 01/19/2009    Current Outpatient Prescriptions  Medication Sig Dispense Refill  . albuterol (VENTOLIN HFA) 108 (90 BASE) MCG/ACT inhaler Inhale 1 to 2 puffs into lung every 4 to 6 hours as needed for SOB / wheezing (Patient taking differently: Inhale 2 puffs into the lungs every 4 (four) hours as needed (plan B). ) 3 Inhaler 1  . benzonatate (TESSALON PERLES) 100 MG capsule Take 1 capsule (100 mg total) by mouth 3 (three) times daily as needed for cough. 20 capsule 0  . Blood Glucose Calibration (TRUE METRIX LEVEL 1) LOW SOLN Use weekly 3 each 1  . Blood Glucose Monitoring Suppl (TRUE METRIX AIR GLUCOSE METER) DEVI 1 Device by Does not apply route 2 (two) times daily. Use to Test blood sugar bid. DX E13.10 1 Device 0  . clonazePAM (KLONOPIN) 0.5 MG tablet Take 1 tablet (0.5 mg total) by mouth 3 (three) times daily as needed for anxiety. 90 tablet 0  . CREON 36000 UNITS CPEP capsule TAKE 3 CAPSULES WITH MEALS  AND TAKE 1 CAPSULE WITH A SNACK AS DIRECTED 1000 capsule 0  . denosumab (PROLIA) 60 MG/ML SOLN injection INJECT 60 MG SQ every 6 months. 1 mL 0  . diltiazem (CARDIZEM) 120 MG tablet TAKE  1 TABLET EVERY DAY 90 tablet 0  . furosemide (LASIX) 40 MG tablet Take 1 tablet (40 mg total) by mouth daily. 90 tablet 1  . glucose blood test strip True metrix test strips. Test bid. E13.10 300 each 1  . HYDROcodone-acetaminophen (NORCO/VICODIN) 5-325 MG tablet Take 1 tablet by mouth 3 (three) times daily as needed for moderate pain. 90 tablet 0  . ipratropium-albuterol (DUONEB) 0.5-2.5 (3) MG/3ML SOLN Take 3 mLs by nebulization every 4 (four) hours  as needed. Plan C    . nitroGLYCERIN (NITROSTAT) 0.4 MG SL tablet Place 1 tablet (0.4 mg total) under the tongue every 5 (five) minutes as needed for chest pain. 25 tablet 3  . Probiotic Product ([place]) CAPS Take 1 capsule by mouth daily.    . simvastatin (ZOCOR) 10 MG tablet Take 1 tablet (10 mg total) by mouth daily. 30 tablet 5  . SYMBICORT 160-4.5 MCG/ACT inhaler INHALE 2 PUFFS TWICE DAILY 3 Inhaler 0  . TRUEPLUS LANCETS 28G MISC Test Blood sugar bid. DX E13.10 300 each 1  . warfarin (COUMADIN) 2 MG tablet TAKE PER PHYSICIAN INSTRUCTIONS 135 tablet 1  . JANUVIA 50 MG tablet TAKE 1 TABLET EVERY DAY 90 tablet 1  . pantoprazole (PROTONIX) 40 MG tablet TAKE 1 TABLET TWICE DAILY 180 tablet 1  . potassium chloride SA (K-DUR,KLOR-CON) 20 MEQ tablet TAKE 1 TABLET (20 MEQ TOTAL) BY MOUTH EVERY MORNING. 90 tablet 1   No current facility-administered medications for this visit.       Objective:    BP 108/62 mmHg  Pulse 60  Temp(Src) 98.3 F (36.8 C) (Oral)  Ht 5\' 2"  (1.575 m)  Wt 147 lb (66.679 kg)  BMI 26.88 kg/m2  Wt Readings from Last 3 Encounters:  05/23/15 147 lb (66.679 kg)  05/17/15 147 lb (66.679 kg)  04/11/15 144 lb 12.8 oz (65.681 kg)    Gen: elderly female, in NAD, alert, cooperative with exam, NCAT EYES: EOMI, no scleral injection or icterus ENT:  TMs pearly gray b/l, OP without erythema, edentulous LYMPH: no cervical LAD CV: NRRR, normal S1/S2 Resp: CTABL, no wheezes, normal WOB Abd: +BS, soft, NTND. no guarding or organomegaly Ext: No edema, warm Neuro: Alert and oriented     Assessment & Plan:   80yoF here for HCM evaluation and f/u of multiple med problems.  Essential hypertension Well controlled with current meds. Last labs 5 months ago. Continue medications.  Tachycardia-bradycardia syndrome Well controlled. Has a pacemaker, recently checked by cardiology.  Permanent atrial fibrillation (HCC) No recent symptoms. On warfarin. Next INR check  next week.  COPD GOLD O  Smokes apprx 5-6 cigarettes. On oxygen at home. Breathing is doing fine, no symptoms, normal exam. Continue current inhalers.  Cigarette smoker Discussed importance of smoking cessation. Pt has been decreasing amount she is smoking. She is pre-contemplative re cessation but would like to in future. Does not smoke with oxygen on.  Encounter for immunization  Other orders -     Flu Vaccine QUAD 36+ mos IM  HCM: Due for flu shot will give today Due for pneumoncoccal shot 23 in one month Due for eye doctor appt, pt is going to call Colonoscopy-UTD Mammogram--currently UTD Pap smear--pt declined further pap smears   Follow up plan: As scheduled   Assunta Found, MD [place] 05/23/2015, 12:16 PM

## 2015-05-24 ENCOUNTER — Encounter: Payer: Commercial Managed Care - HMO | Admitting: Internal Medicine

## 2015-05-27 ENCOUNTER — Ambulatory Visit (INDEPENDENT_AMBULATORY_CARE_PROVIDER_SITE_OTHER): Payer: Commercial Managed Care - HMO | Admitting: Pharmacist

## 2015-05-27 DIAGNOSIS — I482 Chronic atrial fibrillation: Secondary | ICD-10-CM

## 2015-05-27 DIAGNOSIS — I4821 Permanent atrial fibrillation: Secondary | ICD-10-CM

## 2015-05-27 DIAGNOSIS — I480 Paroxysmal atrial fibrillation: Secondary | ICD-10-CM

## 2015-05-27 LAB — POCT INR: INR: 3.1

## 2015-05-27 NOTE — Patient Instructions (Signed)
Anticoagulation Dose Instructions as of 05/27/2015      Dorene Grebe Tue Wed Thu Fri Sat   New Dose 1 mg 1 mg 1 mg 1 mg 2 mg 1 mg 1 mg    Description        No warfarin today - Monday, October 17th, then continue current warfarin 2mg  dose of 1 tablet on Thursdays and 1/2 tablet all other days.       INR was 3.1 today

## 2015-05-31 ENCOUNTER — Encounter: Payer: Self-pay | Admitting: Internal Medicine

## 2015-05-31 ENCOUNTER — Telehealth: Payer: Self-pay | Admitting: Nurse Practitioner

## 2015-05-31 DIAGNOSIS — F419 Anxiety disorder, unspecified: Secondary | ICD-10-CM

## 2015-05-31 MED ORDER — CLONAZEPAM 0.5 MG PO TABS: 0.5000 mg | ORAL_TABLET | Freq: Three times a day (TID) | ORAL | Status: DC | PRN

## 2015-05-31 NOTE — Telephone Encounter (Signed)
Patient aware that rx has been called into pharmacy.

## 2015-05-31 NOTE — Telephone Encounter (Signed)
Please call in klonopin 0.5 mg 1 po TID #90 with 0 refills

## 2015-06-09 DIAGNOSIS — J449 Chronic obstructive pulmonary disease, unspecified: Secondary | ICD-10-CM | POA: Diagnosis not present

## 2015-06-09 DIAGNOSIS — J188 Other pneumonia, unspecified organism: Secondary | ICD-10-CM | POA: Diagnosis not present

## 2015-06-17 ENCOUNTER — Other Ambulatory Visit: Payer: Self-pay | Admitting: Nurse Practitioner

## 2015-06-17 NOTE — Telephone Encounter (Signed)
Patient last had filled on 05/16/2015

## 2015-06-17 NOTE — Telephone Encounter (Signed)
Needs to be seen in clinic. Havent discussed her pain in clinic in over 6 months per chart review

## 2015-06-19 ENCOUNTER — Telehealth: Payer: Self-pay | Admitting: Nurse Practitioner

## 2015-06-19 ENCOUNTER — Other Ambulatory Visit: Payer: Self-pay | Admitting: Nurse Practitioner

## 2015-06-19 DIAGNOSIS — G894 Chronic pain syndrome: Secondary | ICD-10-CM

## 2015-06-19 MED ORDER — HYDROCODONE-ACETAMINOPHEN 5-325 MG PO TABS: 1.0000 | ORAL_TABLET | Freq: Three times a day (TID) | ORAL | Status: DC | PRN

## 2015-06-19 NOTE — Telephone Encounter (Signed)
Patient aware and verbalizes understanding. 

## 2015-06-19 NOTE — Telephone Encounter (Signed)
rx written this time for pain meds TID- need to decrease down to BID- will gradually decrease amount each month- please be aware and try to cut back on meds.

## 2015-06-20 NOTE — Telephone Encounter (Signed)
Referral made  To pain management

## 2015-06-20 NOTE — Telephone Encounter (Signed)
Pt aware.

## 2015-06-25 ENCOUNTER — Other Ambulatory Visit: Payer: Self-pay | Admitting: Family Medicine

## 2015-06-25 ENCOUNTER — Other Ambulatory Visit: Payer: Self-pay | Admitting: Nurse Practitioner

## 2015-07-01 ENCOUNTER — Encounter: Payer: Self-pay | Admitting: Nurse Practitioner

## 2015-07-01 ENCOUNTER — Ambulatory Visit (INDEPENDENT_AMBULATORY_CARE_PROVIDER_SITE_OTHER): Payer: Commercial Managed Care - HMO | Admitting: Nurse Practitioner

## 2015-07-01 ENCOUNTER — Other Ambulatory Visit: Payer: Self-pay | Admitting: Nurse Practitioner

## 2015-07-01 VITALS — BP 103/61 | HR 74 | Temp 97.9°F | Ht 62.0 in | Wt 146.0 lb

## 2015-07-01 DIAGNOSIS — E785 Hyperlipidemia, unspecified: Secondary | ICD-10-CM

## 2015-07-01 DIAGNOSIS — Z7901 Long term (current) use of anticoagulants: Secondary | ICD-10-CM

## 2015-07-01 DIAGNOSIS — I10 Essential (primary) hypertension: Secondary | ICD-10-CM

## 2015-07-01 DIAGNOSIS — F419 Anxiety disorder, unspecified: Secondary | ICD-10-CM | POA: Diagnosis not present

## 2015-07-01 DIAGNOSIS — J449 Chronic obstructive pulmonary disease, unspecified: Secondary | ICD-10-CM | POA: Diagnosis not present

## 2015-07-01 DIAGNOSIS — I482 Chronic atrial fibrillation: Secondary | ICD-10-CM

## 2015-07-01 DIAGNOSIS — I480 Paroxysmal atrial fibrillation: Secondary | ICD-10-CM | POA: Diagnosis not present

## 2015-07-01 DIAGNOSIS — E1110 Type 2 diabetes mellitus with ketoacidosis without coma: Secondary | ICD-10-CM

## 2015-07-01 DIAGNOSIS — K219 Gastro-esophageal reflux disease without esophagitis: Secondary | ICD-10-CM | POA: Diagnosis not present

## 2015-07-01 DIAGNOSIS — Z6826 Body mass index (BMI) 26.0-26.9, adult: Secondary | ICD-10-CM

## 2015-07-01 DIAGNOSIS — I050 Rheumatic mitral stenosis: Secondary | ICD-10-CM | POA: Diagnosis not present

## 2015-07-01 DIAGNOSIS — E1310 Other specified diabetes mellitus with ketoacidosis without coma: Secondary | ICD-10-CM | POA: Diagnosis not present

## 2015-07-01 DIAGNOSIS — I4821 Permanent atrial fibrillation: Secondary | ICD-10-CM

## 2015-07-01 DIAGNOSIS — I2510 Atherosclerotic heart disease of native coronary artery without angina pectoris: Secondary | ICD-10-CM

## 2015-07-01 DIAGNOSIS — G8929 Other chronic pain: Secondary | ICD-10-CM

## 2015-07-01 DIAGNOSIS — Z720 Tobacco use: Secondary | ICD-10-CM

## 2015-07-01 DIAGNOSIS — Z950 Presence of cardiac pacemaker: Secondary | ICD-10-CM

## 2015-07-01 DIAGNOSIS — M549 Dorsalgia, unspecified: Principal | ICD-10-CM

## 2015-07-01 LAB — POCT INR: INR: 1.6

## 2015-07-01 LAB — POCT GLYCOSYLATED HEMOGLOBIN (HGB A1C): Hemoglobin A1C: 5.4

## 2015-07-01 MED ORDER — CLONAZEPAM 0.5 MG PO TABS: 0.5000 mg | ORAL_TABLET | Freq: Three times a day (TID) | ORAL | Status: DC | PRN

## 2015-07-01 MED ORDER — WARFARIN SODIUM 2 MG PO TABS: ORAL_TABLET | ORAL | Status: DC

## 2015-07-01 NOTE — Progress Notes (Signed)
Subjective:    Patient ID: [name], female    DOB: 06-01-1935, 80 y.o.   MRN: [id number]  HPI Patient in today for follow up of chronic medical problems. She is doing okay right now. SHe is still having problems with her stomach- swelling and indigestion. SHe is suppose to see another specilaist in several weeks. Otherwise she is doing well.  Patient Active Problem List   Diagnosis Date Noted  . Nocturnal hypoxemia 04/04/2015  . Hyperlipidemia with target LDL less than 100 12/31/2014  . Cigarette smoker 11/21/2014  . Osteoporosis, post-menopausal 11/01/2013  . GERD (gastroesophageal reflux disease) 07/13/2013  . Diarrhea 07/13/2013  . Aortic stenosis 06/27/2013  . Acute on chronic diastolic CHF (congestive heart failure) ([place]) 06/27/2013  . Vitamin D deficiency   . Tobacco abuse 05/12/2012  . Abdominal wall hernia 10/19/2011  . CAD (coronary artery disease)   . Pacemaker-St.Jude   . Tachycardia-bradycardia syndrome ([place]) 12/03/2010  . Chronic anticoagulation 12/03/2010  . Anxiety 12/03/2010  . Mitral stenosis 05/30/2010  . Anemia 05/16/2010  . DM (diabetes mellitus) type 2, uncontrolled, with ketoacidosis ([place]) 01/19/2009  . Essential hypertension 01/19/2009  . Permanent atrial fibrillation ([place]) 01/19/2009  . COPD GOLD O  01/19/2009  . HEART MURMUR, BENIGN 01/19/2009   Outpatient Encounter Prescriptions as of 07/01/2015  Medication Sig  . albuterol (VENTOLIN HFA) 108 (90 BASE) MCG/ACT inhaler Inhale 1 to 2 puffs into lung every 4 to 6 hours as needed for SOB / wheezing (Patient taking differently: Inhale 2 puffs into the lungs every 4 (four) hours as needed (plan B). )  . Blood Glucose Calibration (TRUE METRIX LEVEL 1) LOW SOLN Use weekly  . Blood Glucose Monitoring Suppl (TRUE METRIX AIR GLUCOSE METER) DEVI 1 Device by Does not apply route 2 (two) times daily. Use to Test blood sugar bid. DX E13.10  . clonazePAM (KLONOPIN) 0.5 MG tablet Take 1 tablet (0.5 mg total)  by mouth 3 (three) times daily as needed for anxiety.  [name] CREON 36000 UNITS CPEP capsule TAKE 3 CAPSULES WITH MEALS  AND TAKE 1 CAPSULE WITH A SNACK AS DIRECTED  . denosumab (PROLIA) 60 MG/ML SOLN injection INJECT 60 MG SQ every 6 months.  . diltiazem (CARDIZEM) 120 MG tablet TAKE 1 TABLET EVERY DAY  . furosemide (LASIX) 40 MG tablet TAKE 1 TABLET EVERY DAY  . glucose blood test strip True metrix test strips. Test bid. E13.10  . HYDROcodone-acetaminophen (NORCO/VICODIN) 5-325 MG tablet Take 1 tablet by mouth 3 (three) times daily as needed for moderate pain.  [name] ipratropium-albuterol (DUONEB) 0.5-2.5 (3) MG/3ML SOLN Take 3 mLs by nebulization every 4 (four) hours as needed. Plan C  . JANUVIA 50 MG tablet TAKE 1 TABLET EVERY DAY  . nitroGLYCERIN (NITROSTAT) 0.4 MG SL tablet Place 1 tablet (0.4 mg total) under the tongue every 5 (five) minutes as needed for chest pain.  . pantoprazole (PROTONIX) 40 MG tablet TAKE 1 TABLET TWICE DAILY  . potassium chloride SA (K-DUR,KLOR-CON) 20 MEQ tablet TAKE 1 TABLET (20 MEQ TOTAL) BY MOUTH EVERY MORNING.  . Probiotic Product ([place]) CAPS Take 1 capsule by mouth daily.  . simvastatin (ZOCOR) 10 MG tablet TAKE 1 TABLET (10 MG TOTAL) BY MOUTH DAILY.  . SYMBICORT 160-4.5 MCG/ACT inhaler INHALE 2 PUFFS TWICE DAILY  . TRUEPLUS LANCETS 28G MISC Test Blood sugar bid. DX E13.10  . warfarin (COUMADIN) 2 MG tablet TAKE PER PHYSICIAN INSTRUCTIONS   No facility-administered encounter medications on file as of  07/01/2015.       Review of Systems  Constitutional: Negative.   HENT: Negative.   Respiratory: Negative.   Cardiovascular: Negative.   Genitourinary: Negative.   Neurological: Negative.   Psychiatric/Behavioral: Negative.   All other systems reviewed and are negative.      Objective:   Physical Exam  Constitutional: She is oriented to person, place, and time. She appears well-developed and well-nourished.  HENT:  Nose: Nose normal.    Mouth/Throat: Oropharynx is clear and moist.  Eyes: EOM are normal.  Neck: Trachea normal, normal range of motion and full passive range of motion without pain. Neck supple. No JVD present. Carotid bruit is not present. No thyromegaly present.  Cardiovascular: Normal rate, regular rhythm, normal heart sounds and intact distal pulses.  Exam reveals no gallop and no friction rub.   No murmur heard. Pulmonary/Chest: Effort normal and breath sounds normal.  Abdominal: Soft. Bowel sounds are normal. She exhibits no distension and no mass. There is no tenderness.  Musculoskeletal: Normal range of motion.  Lymphadenopathy:    She has no cervical adenopathy.  Neurological: She is alert and oriented to person, place, and time. She has normal reflexes.  Skin: Skin is warm and dry.  Psychiatric: She has a normal mood and affect. Her behavior is normal. Judgment and thought content normal.   BP 103/61 mmHg  Pulse 74  Temp(Src) 97.9 F (36.6 C) (Oral)  Ht 5\' 2"  (1.575 m)  Wt 146 lb (66.225 kg)  BMI 26.70 kg/m2  Results for orders placed or performed in visit on 07/01/15  POCT glycosylated hemoglobin (Hb A1C)  Result Value Ref Range   Hemoglobin A1C 5.4   POCT INR  Result Value Ref Range   INR 1.6          Assessment & Plan:   1. Uncontrolled type 2 diabetes mellitus with ketoacidosis without coma, without long-term current use of insulin ([place])   2. Essential hypertension   3. Paroxysmal atrial fibrillation (HCC)   4. Mitral valve stenosis, unspecified etiology   5. Coronary artery disease involving native coronary artery of native heart without angina pectoris   6. COPD GOLD O    7. Gastroesophageal reflux disease without esophagitis   8. Chronic anticoagulation   9. Anxiety   10. Tobacco abuse   11. Hyperlipidemia with target LDL less than 100   12. BMI 26.0-26.9,adult   13. Pacemaker-St.Jude   keep appointment wuth Dr. Irving Shows to check feet tomorrow healh  Maintenance reviewed-  encouraged to get eye exam Discussed future changes to pain medication Labs pending Follow up in 3 months  Pacheco, FNP

## 2015-07-01 NOTE — Patient Instructions (Signed)
Anticoagulation Dose Instructions as of 07/01/2015      [name] Tue Wed Thu Fri Sat   New Dose 1 mg 1 mg 1 mg 1 mg 2 mg 1 mg 1 mg    Description        Take whole tablet tonight and tomorrow nogth then back to current dose of 1/2 tablet daily except on Wednesday

## 2015-07-02 ENCOUNTER — Encounter: Payer: Self-pay | Admitting: Nurse Practitioner

## 2015-07-02 DIAGNOSIS — L84 Corns and callosities: Secondary | ICD-10-CM | POA: Diagnosis not present

## 2015-07-02 DIAGNOSIS — N184 Chronic kidney disease, stage 4 (severe): Secondary | ICD-10-CM

## 2015-07-02 DIAGNOSIS — B351 Tinea unguium: Secondary | ICD-10-CM | POA: Diagnosis not present

## 2015-07-02 DIAGNOSIS — N183 Chronic kidney disease, stage 3 unspecified: Secondary | ICD-10-CM | POA: Insufficient documentation

## 2015-07-02 DIAGNOSIS — E1151 Type 2 diabetes mellitus with diabetic peripheral angiopathy without gangrene: Secondary | ICD-10-CM | POA: Diagnosis not present

## 2015-07-02 LAB — CMP14+EGFR
ALBUMIN: 3.8 g/dL (ref 3.5–4.8)
ALT: 14 IU/L (ref 0–32)
AST: 19 IU/L (ref 0–40)
Albumin/Globulin Ratio: 1.4 (ref 1.1–2.5)
Alkaline Phosphatase: 59 IU/L (ref 39–117)
BUN / CREAT RATIO: 15 (ref 11–26)
BUN: 25 mg/dL (ref 8–27)
Bilirubin Total: 0.4 mg/dL (ref 0.0–1.2)
CO2: 21 mmol/L (ref 18–29)
CREATININE: 1.64 mg/dL — AB (ref 0.57–1.00)
Calcium: 8.8 mg/dL (ref 8.7–10.3)
Chloride: 99 mmol/L (ref 97–106)
GFR calc non Af Amer: 30 mL/min/{1.73_m2} — ABNORMAL LOW (ref >59)
GFR, EST AFRICAN AMERICAN: 34 mL/min/{1.73_m2} — AB (ref >59)
GLUCOSE: 131 mg/dL — AB (ref 65–99)
Globulin, Total: 2.8 g/dL (ref 1.5–4.5)
Potassium: 4.2 mmol/L (ref 3.5–5.2)
Sodium: 136 mmol/L (ref 136–144)
TOTAL PROTEIN: 6.6 g/dL (ref 6.0–8.5)

## 2015-07-02 LAB — LIPID PANEL
CHOLESTEROL TOTAL: 115 mg/dL (ref 100–199)
Chol/HDL Ratio: 4.3 ratio units (ref 0.0–4.4)
HDL: 27 mg/dL — ABNORMAL LOW (ref >39)
LDL CALC: 68 mg/dL (ref 0–99)
Triglycerides: 98 mg/dL (ref 0–149)
VLDL CHOLESTEROL CAL: 20 mg/dL (ref 5–40)

## 2015-07-10 DIAGNOSIS — J449 Chronic obstructive pulmonary disease, unspecified: Secondary | ICD-10-CM | POA: Diagnosis not present

## 2015-07-10 DIAGNOSIS — J188 Other pneumonia, unspecified organism: Secondary | ICD-10-CM | POA: Diagnosis not present

## 2015-07-19 ENCOUNTER — Other Ambulatory Visit: Payer: Self-pay | Admitting: Nurse Practitioner

## 2015-07-19 MED ORDER — HYDROCODONE-ACETAMINOPHEN 5-325 MG PO TABS: 1.0000 | ORAL_TABLET | Freq: Three times a day (TID) | ORAL | Status: DC | PRN

## 2015-07-19 NOTE — Telephone Encounter (Signed)
Pain rx ready for pick up Would like for her to try to decrease to  No more then 2 a day

## 2015-07-24 ENCOUNTER — Other Ambulatory Visit: Payer: Self-pay | Admitting: Pharmacist

## 2015-07-24 MED ORDER — DENOSUMAB 60 MG/ML ~~LOC~~ SOLN: SUBCUTANEOUS | Status: DC

## 2015-07-24 NOTE — Telephone Encounter (Signed)
Spoke with patient and she is aware that [name], FNP wants her to decrease pain medication to no more than 2 a day.

## 2015-07-27 DIAGNOSIS — R509 Fever, unspecified: Secondary | ICD-10-CM | POA: Diagnosis not present

## 2015-07-27 DIAGNOSIS — J029 Acute pharyngitis, unspecified: Secondary | ICD-10-CM | POA: Diagnosis not present

## 2015-07-27 DIAGNOSIS — R05 Cough: Secondary | ICD-10-CM | POA: Diagnosis not present

## 2015-07-27 DIAGNOSIS — J449 Chronic obstructive pulmonary disease, unspecified: Secondary | ICD-10-CM | POA: Diagnosis not present

## 2015-07-31 ENCOUNTER — Telehealth: Payer: Self-pay | Admitting: Pharmacist

## 2015-07-31 ENCOUNTER — Ambulatory Visit (INDEPENDENT_AMBULATORY_CARE_PROVIDER_SITE_OTHER): Payer: Commercial Managed Care - HMO | Admitting: Pharmacist

## 2015-07-31 DIAGNOSIS — I480 Paroxysmal atrial fibrillation: Secondary | ICD-10-CM

## 2015-07-31 DIAGNOSIS — I4821 Permanent atrial fibrillation: Secondary | ICD-10-CM

## 2015-07-31 DIAGNOSIS — I482 Chronic atrial fibrillation: Secondary | ICD-10-CM | POA: Diagnosis not present

## 2015-07-31 LAB — POCT INR: INR: 1.6

## 2015-07-31 NOTE — Patient Instructions (Signed)
Anticoagulation Dose Instructions as of 07/31/2015      [name] Tue Wed Thu Fri Sat   New Dose 1 mg 2 mg 1 mg 1 mg 2 mg 1 mg 1 mg    Description        Take 1 tablet today - Wednesday, December 21st.  Then increase dose to 1 tablet on Mondays and Thursdays and 1/2 tablet all other days.      INR was 1.6 today

## 2015-08-08 ENCOUNTER — Other Ambulatory Visit: Payer: Self-pay

## 2015-08-08 DIAGNOSIS — M549 Dorsalgia, unspecified: Secondary | ICD-10-CM

## 2015-08-09 DIAGNOSIS — J188 Other pneumonia, unspecified organism: Secondary | ICD-10-CM | POA: Diagnosis not present

## 2015-08-09 DIAGNOSIS — J449 Chronic obstructive pulmonary disease, unspecified: Secondary | ICD-10-CM | POA: Diagnosis not present

## 2015-08-13 ENCOUNTER — Ambulatory Visit (INDEPENDENT_AMBULATORY_CARE_PROVIDER_SITE_OTHER): Payer: Commercial Managed Care - HMO | Admitting: Pharmacist

## 2015-08-13 ENCOUNTER — Ambulatory Visit (INDEPENDENT_AMBULATORY_CARE_PROVIDER_SITE_OTHER): Payer: Commercial Managed Care - HMO

## 2015-08-13 DIAGNOSIS — I480 Paroxysmal atrial fibrillation: Secondary | ICD-10-CM

## 2015-08-13 DIAGNOSIS — R1030 Lower abdominal pain, unspecified: Secondary | ICD-10-CM

## 2015-08-13 DIAGNOSIS — M810 Age-related osteoporosis without current pathological fracture: Secondary | ICD-10-CM

## 2015-08-13 DIAGNOSIS — I482 Chronic atrial fibrillation: Secondary | ICD-10-CM | POA: Diagnosis not present

## 2015-08-13 DIAGNOSIS — R1032 Left lower quadrant pain: Secondary | ICD-10-CM

## 2015-08-13 DIAGNOSIS — I4821 Permanent atrial fibrillation: Secondary | ICD-10-CM

## 2015-08-13 LAB — POCT INR: INR: 2

## 2015-08-13 MED ORDER — DENOSUMAB 60 MG/ML ~~LOC~~ SOLN
60.0000 mg | Freq: Once | SUBCUTANEOUS | Status: AC
  Administered 2015-08-13: 60 mg via SUBCUTANEOUS

## 2015-08-13 NOTE — Progress Notes (Signed)
Subjective:     Indication: atrial fibrillation / osteoporosis Bleeding signs/symptoms: None Thromboembolic signs/symptoms: None  Missed Coumadin doses: None Medication changes: none Dietary changes: no Bacterial/viral infection: no Other concerns: yes - patient is due to have Prolia administered in office today.  Medication was shipped to our office from her mail order pharmacy and is supplied by patient Last DEXA was 11/01/2013 - next cure 10/2015  Patient also states that yesterday she has pain in her left groin area.  It lasted only a short time- about 15 minutes but has remained a little sore over the last 24 hours.   The following portions of the patient's history were reviewed and updated as appropriate: allergies, current medications, past family history, past medical history, past social history, past surgical history and problem list.   Objective:    INR Today: 2.0 Current dose: warfarin 2mg  - 1 tablet on mondays and thursdays and 1/2 tablet all other days.   Assessment:    Therapeutic INR for goal of 2-3  Osteoporosis, post menopausal Left groin pain and taking Prolia   Plan:    1. New dose: no change   2. Next INR: 1 month   3.  Prolia 60mg  administered SQ.  4.  Xray of left hip checked today - no sign of fracture noted. Continue to take current pain medication as needed.   If pain does not resolve or if worsens in next 48 to 72 hours then patient is advised to call office.   Cherre Robins, PharmD, CPP

## 2015-08-13 NOTE — Patient Instructions (Signed)
Anticoagulation Dose Instructions as of 08/13/2015      [name] Tue Wed Thu Fri Sat   New Dose 1 mg 2 mg 1 mg 1 mg 2 mg 1 mg 1 mg    Description        Continue current warfarin 2mg  dose of 1 tablet on Mondays and Thursdays and 1/2 tablet all other days.      INR was 2.0 today

## 2015-08-16 ENCOUNTER — Ambulatory Visit (HOSPITAL_COMMUNITY)
Admission: RE | Admit: 2015-08-16 | Discharge: 2015-08-16 | Disposition: A | Discharge status: Home / Self Care | Payer: Commercial Managed Care - HMO | Source: Ambulatory Visit | Attending: Nurse Practitioner | Admitting: Nurse Practitioner

## 2015-08-16 DIAGNOSIS — E559 Vitamin D deficiency, unspecified: Secondary | ICD-10-CM | POA: Diagnosis not present

## 2015-08-16 DIAGNOSIS — M4806 Spinal stenosis, lumbar region: Secondary | ICD-10-CM | POA: Diagnosis not present

## 2015-08-16 DIAGNOSIS — M25551 Pain in right hip: Secondary | ICD-10-CM | POA: Diagnosis not present

## 2015-08-16 DIAGNOSIS — G8929 Other chronic pain: Secondary | ICD-10-CM | POA: Diagnosis not present

## 2015-08-16 DIAGNOSIS — R109 Unspecified abdominal pain: Secondary | ICD-10-CM | POA: Insufficient documentation

## 2015-08-16 DIAGNOSIS — M545 Low back pain: Secondary | ICD-10-CM | POA: Diagnosis not present

## 2015-08-16 DIAGNOSIS — M549 Dorsalgia, unspecified: Secondary | ICD-10-CM

## 2015-08-19 ENCOUNTER — Telehealth: Payer: Self-pay | Admitting: Cardiology

## 2015-08-19 ENCOUNTER — Ambulatory Visit (INDEPENDENT_AMBULATORY_CARE_PROVIDER_SITE_OTHER): Payer: Commercial Managed Care - HMO | Admitting: *Deleted

## 2015-08-19 DIAGNOSIS — I495 Sick sinus syndrome: Secondary | ICD-10-CM

## 2015-08-19 NOTE — Telephone Encounter (Signed)
Returned pt phone call and informed her that I called to remind her of her remote transmission that was due today. Pt verbalized understanding.

## 2015-08-19 NOTE — Telephone Encounter (Signed)
Attempted to confirm remote transmission with pt. No answer and was unable to leave a message.   

## 2015-08-20 NOTE — Progress Notes (Signed)
Remote pacemaker transmission.   

## 2015-08-26 ENCOUNTER — Telehealth: Payer: Self-pay | Admitting: Nurse Practitioner

## 2015-08-26 ENCOUNTER — Other Ambulatory Visit: Payer: Self-pay | Admitting: Pediatrics

## 2015-08-26 MED ORDER — HYDROCODONE-ACETAMINOPHEN 5-325 MG PO TABS: 1.0000 | ORAL_TABLET | Freq: Three times a day (TID) | ORAL | Status: DC | PRN

## 2015-08-26 NOTE — Telephone Encounter (Signed)
rx ready for pickup 

## 2015-08-26 NOTE — Telephone Encounter (Signed)
Patient aware rx is ready to be picked up 

## 2015-09-02 ENCOUNTER — Telehealth: Payer: Self-pay | Admitting: Nurse Practitioner

## 2015-09-02 DIAGNOSIS — F419 Anxiety disorder, unspecified: Secondary | ICD-10-CM

## 2015-09-02 MED ORDER — CLONAZEPAM 0.5 MG PO TABS: 0.5000 mg | ORAL_TABLET | Freq: Three times a day (TID) | ORAL | Status: DC | PRN

## 2015-09-02 NOTE — Telephone Encounter (Signed)
Please call in klonopin 0.5mg  1 po TID #90  with 1 refills

## 2015-09-02 NOTE — Telephone Encounter (Signed)
rx called into pharmacy and pt aware.

## 2015-09-06 DIAGNOSIS — J209 Acute bronchitis, unspecified: Secondary | ICD-10-CM | POA: Diagnosis not present

## 2015-09-06 DIAGNOSIS — R0602 Shortness of breath: Secondary | ICD-10-CM | POA: Diagnosis not present

## 2015-09-06 DIAGNOSIS — R05 Cough: Secondary | ICD-10-CM | POA: Diagnosis not present

## 2015-09-08 LAB — CUP PACEART REMOTE DEVICE CHECK
Battery Remaining Longevity: 143 mo
Battery Remaining Percentage: 95.5 %
Battery Voltage: 2.96 V
Implantable Lead Implant Date: 20120323
Implantable Lead Location: 753860
Implantable Lead Model: 1948
Lead Channel Impedance Value: 790 Ohm
Lead Channel Pacing Threshold Pulse Width: 0.5 ms
Lead Channel Setting Pacing Pulse Width: 0.5 ms
Lead Channel Setting Sensing Sensitivity: 2 mV
MDC IDC MSMT LEADCHNL RV PACING THRESHOLD AMPLITUDE: 0.75 V
MDC IDC MSMT LEADCHNL RV SENSING INTR AMPL: 11.5 mV
MDC IDC PG SERIAL: 7199163
MDC IDC SESS DTM: 20170109201410
MDC IDC SET LEADCHNL RV PACING AMPLITUDE: 2.5 V
MDC IDC STAT BRADY RV PERCENT PACED: 10 %

## 2015-09-09 DIAGNOSIS — J188 Other pneumonia, unspecified organism: Secondary | ICD-10-CM | POA: Diagnosis not present

## 2015-09-09 DIAGNOSIS — J449 Chronic obstructive pulmonary disease, unspecified: Secondary | ICD-10-CM | POA: Diagnosis not present

## 2015-09-09 NOTE — Telephone Encounter (Signed)
It looks like referral has been made

## 2015-09-10 DIAGNOSIS — E1151 Type 2 diabetes mellitus with diabetic peripheral angiopathy without gangrene: Secondary | ICD-10-CM | POA: Diagnosis not present

## 2015-09-10 DIAGNOSIS — B351 Tinea unguium: Secondary | ICD-10-CM | POA: Diagnosis not present

## 2015-09-10 DIAGNOSIS — L84 Corns and callosities: Secondary | ICD-10-CM | POA: Diagnosis not present

## 2015-09-11 ENCOUNTER — Encounter: Payer: Self-pay | Admitting: Cardiology

## 2015-09-12 ENCOUNTER — Ambulatory Visit (INDEPENDENT_AMBULATORY_CARE_PROVIDER_SITE_OTHER): Payer: Commercial Managed Care - HMO | Admitting: Pharmacist

## 2015-09-12 DIAGNOSIS — I480 Paroxysmal atrial fibrillation: Secondary | ICD-10-CM | POA: Diagnosis not present

## 2015-09-12 DIAGNOSIS — I482 Chronic atrial fibrillation: Secondary | ICD-10-CM

## 2015-09-12 DIAGNOSIS — I4821 Permanent atrial fibrillation: Secondary | ICD-10-CM

## 2015-09-12 LAB — POCT INR: INR: 3

## 2015-09-12 NOTE — Patient Instructions (Addendum)
Anticoagulation Dose Instructions as of 09/12/2015      [name] Tue Wed Thu Fri Sat   New Dose 1 mg 2 mg 1 mg 1 mg 2 mg 1 mg 1 mg    Description        Take 1/2 tablet instead of 1 tablet today - Thursday, February 2nd. Then continue current warfarin 2mg  dose of 1 tablet on Mondays and Thursdays and 1/2 tablet all other days.      INR was 3.0 today

## 2015-09-13 DIAGNOSIS — M4806 Spinal stenosis, lumbar region: Secondary | ICD-10-CM | POA: Diagnosis not present

## 2015-09-13 DIAGNOSIS — M5136 Other intervertebral disc degeneration, lumbar region: Secondary | ICD-10-CM | POA: Diagnosis not present

## 2015-09-13 DIAGNOSIS — M545 Low back pain: Secondary | ICD-10-CM | POA: Diagnosis not present

## 2015-09-13 DIAGNOSIS — M47817 Spondylosis without myelopathy or radiculopathy, lumbosacral region: Secondary | ICD-10-CM | POA: Diagnosis not present

## 2015-09-23 ENCOUNTER — Telehealth: Payer: Self-pay | Admitting: *Deleted

## 2015-09-23 MED ORDER — HYDROCODONE-ACETAMINOPHEN 5-325 MG PO TABS: 1.0000 | ORAL_TABLET | Freq: Three times a day (TID) | ORAL | Status: DC | PRN

## 2015-09-23 NOTE — Telephone Encounter (Signed)
Patient is requesting a refill on hydrocodone until the 21st of February. She has an appointment with [facility] then.

## 2015-09-23 NOTE — Telephone Encounter (Signed)
Pain rx ready for pick up  

## 2015-09-23 NOTE — Telephone Encounter (Signed)
Patient aware rx is ready to be picked up 

## 2015-09-25 ENCOUNTER — Encounter: Payer: Self-pay | Admitting: Cardiology

## 2015-10-01 DIAGNOSIS — M47816 Spondylosis without myelopathy or radiculopathy, lumbar region: Secondary | ICD-10-CM | POA: Diagnosis not present

## 2015-10-01 DIAGNOSIS — M25561 Pain in right knee: Secondary | ICD-10-CM | POA: Diagnosis not present

## 2015-10-01 DIAGNOSIS — M545 Low back pain: Secondary | ICD-10-CM | POA: Diagnosis not present

## 2015-10-01 DIAGNOSIS — Z79899 Other long term (current) drug therapy: Secondary | ICD-10-CM | POA: Diagnosis not present

## 2015-10-01 DIAGNOSIS — Z7901 Long term (current) use of anticoagulants: Secondary | ICD-10-CM | POA: Diagnosis not present

## 2015-10-01 DIAGNOSIS — M47817 Spondylosis without myelopathy or radiculopathy, lumbosacral region: Secondary | ICD-10-CM | POA: Diagnosis not present

## 2015-10-01 DIAGNOSIS — M4806 Spinal stenosis, lumbar region: Secondary | ICD-10-CM | POA: Diagnosis not present

## 2015-10-01 DIAGNOSIS — M25562 Pain in left knee: Secondary | ICD-10-CM | POA: Diagnosis not present

## 2015-10-01 DIAGNOSIS — M5136 Other intervertebral disc degeneration, lumbar region: Secondary | ICD-10-CM | POA: Diagnosis not present

## 2015-10-04 ENCOUNTER — Ambulatory Visit (INDEPENDENT_AMBULATORY_CARE_PROVIDER_SITE_OTHER): Payer: Commercial Managed Care - HMO | Admitting: Nurse Practitioner

## 2015-10-04 ENCOUNTER — Encounter: Payer: Self-pay | Admitting: Nurse Practitioner

## 2015-10-04 VITALS — BP 97/56 | HR 59 | Temp 97.7°F | Ht 62.0 in | Wt 134.6 lb

## 2015-10-04 DIAGNOSIS — I480 Paroxysmal atrial fibrillation: Secondary | ICD-10-CM | POA: Diagnosis not present

## 2015-10-04 DIAGNOSIS — F419 Anxiety disorder, unspecified: Secondary | ICD-10-CM

## 2015-10-04 DIAGNOSIS — E785 Hyperlipidemia, unspecified: Secondary | ICD-10-CM

## 2015-10-04 DIAGNOSIS — I482 Chronic atrial fibrillation: Secondary | ICD-10-CM

## 2015-10-04 DIAGNOSIS — I10 Essential (primary) hypertension: Secondary | ICD-10-CM | POA: Diagnosis not present

## 2015-10-04 DIAGNOSIS — N184 Chronic kidney disease, stage 4 (severe): Secondary | ICD-10-CM

## 2015-10-04 DIAGNOSIS — G4734 Idiopathic sleep related nonobstructive alveolar hypoventilation: Secondary | ICD-10-CM | POA: Diagnosis not present

## 2015-10-04 DIAGNOSIS — I4821 Permanent atrial fibrillation: Secondary | ICD-10-CM

## 2015-10-04 DIAGNOSIS — E1110 Type 2 diabetes mellitus with ketoacidosis without coma: Secondary | ICD-10-CM

## 2015-10-04 DIAGNOSIS — I5033 Acute on chronic diastolic (congestive) heart failure: Secondary | ICD-10-CM | POA: Diagnosis not present

## 2015-10-04 DIAGNOSIS — Z720 Tobacco use: Secondary | ICD-10-CM

## 2015-10-04 DIAGNOSIS — E1310 Other specified diabetes mellitus with ketoacidosis without coma: Secondary | ICD-10-CM | POA: Diagnosis not present

## 2015-10-04 DIAGNOSIS — Z23 Encounter for immunization: Secondary | ICD-10-CM

## 2015-10-04 DIAGNOSIS — I2510 Atherosclerotic heart disease of native coronary artery without angina pectoris: Secondary | ICD-10-CM

## 2015-10-04 DIAGNOSIS — Z7901 Long term (current) use of anticoagulants: Secondary | ICD-10-CM

## 2015-10-04 DIAGNOSIS — K219 Gastro-esophageal reflux disease without esophagitis: Secondary | ICD-10-CM

## 2015-10-04 LAB — POCT INR: INR: 1.2

## 2015-10-04 LAB — POCT GLYCOSYLATED HEMOGLOBIN (HGB A1C): Hemoglobin A1C: 5.7

## 2015-10-04 NOTE — Patient Instructions (Signed)

## 2015-10-04 NOTE — Addendum Note (Signed)
Addended by: Jamelle Haring on: 10/04/2015 12:15 PM   Modules accepted: Orders, SmartSet

## 2015-10-04 NOTE — Progress Notes (Signed)
 Subjective:    Patient ID: [name], female    DOB: 08/23/1934, 80 y.o.   MRN: [id number]  HPI Patient in today for follow  Up of chronic medical problems- she is doing well today without complaints- she is being followed at the pain clinic for her chronic back and joint pain, she endorse better pain control with her current regimen..   Patient Active Problem List   Diagnosis Date Noted  . Chronic kidney disease (CKD), stage IV (severe) (HCC) 07/02/2015  . Nocturnal hypoxemia 04/04/2015  . Hyperlipidemia with target LDL less than 100 12/31/2014  . Osteoporosis, post-menopausal 11/01/2013  . GERD (gastroesophageal reflux disease) 07/13/2013  . Diarrhea 07/13/2013  . Aortic stenosis 06/27/2013  . Acute on chronic diastolic CHF (congestive heart failure) (HCC) 06/27/2013  . Vitamin D deficiency   . Tobacco abuse 05/12/2012  . Abdominal wall hernia 10/19/2011  . CAD (coronary artery disease)   . Pacemaker-St.Jude   . Tachycardia-bradycardia syndrome (HCC) 12/03/2010  . Chronic anticoagulation 12/03/2010  . Anxiety 12/03/2010  . Mitral stenosis 05/30/2010  . Anemia 05/16/2010  . DM (diabetes mellitus) type 2, uncontrolled, with ketoacidosis (HCC) 01/19/2009  . Essential hypertension 01/19/2009  . Permanent atrial fibrillation (HCC) 01/19/2009  . COPD GOLD O  01/19/2009  . HEART MURMUR, BENIGN 01/19/2009   Outpatient Encounter Prescriptions as of 10/04/2015  Medication Sig  . albuterol (VENTOLIN HFA) 108 (90 BASE) MCG/ACT inhaler Inhale 1 to 2 puffs into lung every 4 to 6 hours as needed for SOB / wheezing (Patient taking differently: Inhale 2 puffs into the lungs every 4 (four) hours as needed (plan B). )  . Blood Glucose Calibration (TRUE METRIX LEVEL 1) LOW SOLN Use weekly  . Blood Glucose Monitoring Suppl (TRUE METRIX AIR GLUCOSE METER) DEVI 1 Device by Does not apply route 2 (two) times daily. Use to Test blood sugar bid. DX E13.10  . clonazePAM (KLONOPIN) 0.5 MG tablet  Take 1 tablet (0.5 mg total) by mouth 3 (three) times daily as needed for anxiety.  . CREON 36000 UNITS CPEP capsule TAKE 3 CAPSULES WITH MEALS  AND TAKE 1 CAPSULE WITH A SNACK AS DIRECTED  . denosumab (PROLIA) 60 MG/ML SOLN injection INJECT 60 MG SQ every 6 months.  . diltiazem (CARDIZEM) 120 MG tablet TAKE 1 TABLET EVERY DAY  . furosemide (LASIX) 40 MG tablet TAKE 1 TABLET EVERY DAY  . glucose blood test strip True metrix test strips. Test bid. E13.10  . HYDROcodone-acetaminophen (NORCO/VICODIN) 5-325 MG tablet Take 1 tablet by mouth 3 (three) times daily as needed for moderate pain.  . ipratropium-albuterol (DUONEB) 0.5-2.5 (3) MG/3ML SOLN Take 3 mLs by nebulization every 4 (four) hours as needed. Plan C  . JANUVIA 50 MG tablet TAKE 1 TABLET EVERY DAY  . nitroGLYCERIN (NITROSTAT) 0.4 MG SL tablet Place 1 tablet (0.4 mg total) under the tongue every 5 (five) minutes as needed for chest pain.  . pantoprazole (PROTONIX) 40 MG tablet TAKE 1 TABLET TWICE DAILY  . potassium chloride SA (K-DUR,KLOR-CON) 20 MEQ tablet TAKE 1 TABLET (20 MEQ TOTAL) BY MOUTH EVERY MORNING.  . Probiotic Product (PHILLIPS COLON HEALTH) CAPS Take 1 capsule by mouth daily.  . simvastatin (ZOCOR) 10 MG tablet TAKE 1 TABLET EVERY DAY  . SYMBICORT 160-4.5 MCG/ACT inhaler INHALE 2 PUFFS TWICE DAILY  . TRUEPLUS LANCETS 28G MISC Test Blood sugar bid. DX E13.10  . warfarin (COUMADIN) 2 MG tablet TAKE PER PHYSICIAN INSTRUCTIONS  . [DISCONTINUED] doxycycline (VIBRAMYCIN) 100 MG   capsule Take 1 capsule by mouth 2 (two) times daily before a meal.   No facility-administered encounter medications on file as of 10/04/2015.    Review of Systems  Constitutional: Positive for fatigue.  HENT: Negative.   Respiratory: Positive for shortness of breath.   Cardiovascular: Positive for leg swelling.  Gastrointestinal: Negative.   Genitourinary: Negative.   Musculoskeletal: Negative.   Neurological: Negative.   Psychiatric/Behavioral:  Negative.   All other systems reviewed and are negative.      Objective:   Physical Exam  Constitutional: She is oriented to person, place, and time. She appears well-developed and well-nourished.  HENT:  Nose: Nose normal.  Mouth/Throat: Oropharynx is clear and moist.  Eyes: EOM are normal.  Neck: Trachea normal, normal range of motion and full passive range of motion without pain. Neck supple. No JVD present. Carotid bruit is not present. No thyromegaly present.  Cardiovascular: Normal rate, regular rhythm, normal heart sounds and intact distal pulses.  Exam reveals no gallop and no friction rub.   No murmur heard. Pulmonary/Chest: Effort normal and breath sounds normal.  Abdominal: Soft. Bowel sounds are normal. She exhibits no distension and no mass. There is no tenderness.  Musculoskeletal: Normal range of motion.  Lymphadenopathy:    She has no cervical adenopathy.  Neurological: She is alert and oriented to person, place, and time. She has normal reflexes.  Skin: Skin is warm and dry.  Psychiatric: She has a normal mood and affect. Her behavior is normal. Judgment and thought content normal.   BP 97/56 mmHg  Pulse 59  Temp(Src) 97.7 F (36.5 C) (Oral)  Ht 5' 2" (1.575 m)  Wt 134 lb 9.6 oz (61.054 kg)  BMI 24.61 kg/m2   Results for orders placed or performed in visit on 10/04/15  POCT glycosylated hemoglobin (Hb A1C)  Result Value Ref Range   Hemoglobin A1C 5.7   POCT INR  Result Value Ref Range   INR 1.2          Assessment & Plan:   1. Essential hypertension   2. Uncontrolled type 2 diabetes mellitus with ketoacidosis without coma, without long-term current use of insulin (HCC)   3. Paroxysmal atrial fibrillation ([place])   4. Permanent atrial fibrillation ([place])   5. Coronary artery disease involving native coronary artery of native heart without angina pectoris   6. Acute on chronic diastolic CHF (congestive heart failure) ([place])   7. Nocturnal hypoxemia     8. Gastroesophageal reflux disease without esophagitis   9. Chronic kidney disease (CKD), stage IV (severe) ([place])   10. Chronic anticoagulation   11. Anxiety   12. Hyperlipidemia with target LDL less than 100   13. Tobacco abuse    currently not on coumadin will recheck once she starts back on meds.  Orders Placed This Encounter  Procedures  . CMP14+EGFR  . Lipid panel  . POCT glycosylated hemoglobin (Hb A1C)    Pneumonia 23 today Labs pending Health maintenance reviewed Diet and exercise encouraged Continue all meds Follow up  In 3 months   [place], FNP

## 2015-10-05 LAB — CMP14+EGFR
A/G RATIO: 1.3 (ref 1.1–2.5)
ALK PHOS: 54 IU/L (ref 39–117)
ALT: 12 IU/L (ref 0–32)
AST: 21 IU/L (ref 0–40)
Albumin: 3.8 g/dL (ref 3.5–4.8)
BILIRUBIN TOTAL: 0.5 mg/dL (ref 0.0–1.2)
BUN / CREAT RATIO: 18 (ref 11–26)
BUN: 31 mg/dL — ABNORMAL HIGH (ref 8–27)
CHLORIDE: 100 mmol/L (ref 96–106)
CO2: 18 mmol/L (ref 18–29)
Calcium: 9.2 mg/dL (ref 8.7–10.3)
Creatinine, Ser: 1.68 mg/dL — ABNORMAL HIGH (ref 0.57–1.00)
GFR calc non Af Amer: 29 mL/min/{1.73_m2} — ABNORMAL LOW (ref >59)
GFR, EST AFRICAN AMERICAN: 33 mL/min/{1.73_m2} — AB (ref >59)
Globulin, Total: 2.9 g/dL (ref 1.5–4.5)
Glucose: 148 mg/dL — ABNORMAL HIGH (ref 65–99)
POTASSIUM: 5 mmol/L (ref 3.5–5.2)
Sodium: 138 mmol/L (ref 134–144)
TOTAL PROTEIN: 6.7 g/dL (ref 6.0–8.5)

## 2015-10-05 LAB — LIPID PANEL
Chol/HDL Ratio: 3.3 ratio units (ref 0.0–4.4)
Cholesterol, Total: 112 mg/dL (ref 100–199)
HDL: 34 mg/dL — AB (ref >39)
LDL Calculated: 64 mg/dL (ref 0–99)
Triglycerides: 68 mg/dL (ref 0–149)
VLDL Cholesterol Cal: 14 mg/dL (ref 5–40)

## 2015-10-06 DIAGNOSIS — J449 Chronic obstructive pulmonary disease, unspecified: Secondary | ICD-10-CM | POA: Diagnosis not present

## 2015-10-06 DIAGNOSIS — Z7951 Long term (current) use of inhaled steroids: Secondary | ICD-10-CM | POA: Diagnosis not present

## 2015-10-06 DIAGNOSIS — M25562 Pain in left knee: Secondary | ICD-10-CM | POA: Diagnosis not present

## 2015-10-06 DIAGNOSIS — I509 Heart failure, unspecified: Secondary | ICD-10-CM | POA: Diagnosis not present

## 2015-10-06 DIAGNOSIS — Z7901 Long term (current) use of anticoagulants: Secondary | ICD-10-CM | POA: Diagnosis not present

## 2015-10-06 DIAGNOSIS — K219 Gastro-esophageal reflux disease without esophagitis: Secondary | ICD-10-CM | POA: Diagnosis not present

## 2015-10-06 DIAGNOSIS — M810 Age-related osteoporosis without current pathological fracture: Secondary | ICD-10-CM | POA: Diagnosis not present

## 2015-10-06 DIAGNOSIS — E119 Type 2 diabetes mellitus without complications: Secondary | ICD-10-CM | POA: Diagnosis not present

## 2015-10-06 DIAGNOSIS — Z79899 Other long term (current) drug therapy: Secondary | ICD-10-CM | POA: Diagnosis not present

## 2015-10-07 ENCOUNTER — Other Ambulatory Visit: Payer: Self-pay | Admitting: Pediatrics

## 2015-10-08 DIAGNOSIS — J188 Other pneumonia, unspecified organism: Secondary | ICD-10-CM | POA: Diagnosis not present

## 2015-10-08 DIAGNOSIS — J449 Chronic obstructive pulmonary disease, unspecified: Secondary | ICD-10-CM | POA: Diagnosis not present

## 2015-10-16 ENCOUNTER — Ambulatory Visit (INDEPENDENT_AMBULATORY_CARE_PROVIDER_SITE_OTHER): Payer: Commercial Managed Care - HMO | Admitting: Pediatrics

## 2015-10-16 ENCOUNTER — Telehealth: Payer: Self-pay | Admitting: *Deleted

## 2015-10-16 ENCOUNTER — Encounter: Payer: Self-pay | Admitting: Pediatrics

## 2015-10-16 VITALS — BP 106/62 | HR 65 | Temp 97.8°F | Ht 62.0 in | Wt 133.0 lb

## 2015-10-16 DIAGNOSIS — I482 Chronic atrial fibrillation: Secondary | ICD-10-CM | POA: Diagnosis not present

## 2015-10-16 DIAGNOSIS — Z7901 Long term (current) use of anticoagulants: Secondary | ICD-10-CM | POA: Diagnosis not present

## 2015-10-16 DIAGNOSIS — I4821 Permanent atrial fibrillation: Secondary | ICD-10-CM

## 2015-10-16 DIAGNOSIS — J441 Chronic obstructive pulmonary disease with (acute) exacerbation: Secondary | ICD-10-CM | POA: Diagnosis not present

## 2015-10-16 DIAGNOSIS — Z720 Tobacco use: Secondary | ICD-10-CM

## 2015-10-16 MED ORDER — PREDNISONE 20 MG PO TABS: ORAL_TABLET | ORAL | Status: DC

## 2015-10-16 MED ORDER — DOXYCYCLINE MONOHYDRATE 100 MG PO TABS
100.0000 mg | ORAL_TABLET | Freq: Two times a day (BID) | ORAL | Status: DC
Start: 2015-10-16 — End: 2015-11-01

## 2015-10-16 NOTE — Progress Notes (Signed)
Subjective:    Patient ID: [name], female    DOB: 19-Dec-1934, 81 y.o.   MRN: [id number]  CC: Cough and Diarrhea   HPI: [name] is a 81 y.o. female presenting for Cough and Diarrhea  Coughing, has side pain, also has had diarrhea Several family members with viral illnesses, no known flu exposure Albuterol needing to use more regularly Seeing Dr. Lyla Son for pain, got 4 shots on 2/21 so stopped warfarin   Loose stools a few times a day No blood or dark colors in stools  Taking other medicines including diabetes medicines regularly.  No fevers No increased sputum production Worsening wheeze Worsening cough   Depression screen [facility] 2/9 10/16/2015 07/01/2015 05/23/2015 12/31/2014 10/12/2014  Decreased Interest 0 0 0 0 0  Down, Depressed, Hopeless 0 0 0 0 1  PHQ - 2 Score 0 0 0 0 1     Relevant past medical, surgical, family and social history reviewed and updated as indicated. Interim medical history since our last visit reviewed. Allergies and medications reviewed and updated.    ROS: Per HPI unless specifically indicated above  History  Smoking status  . Current Every Day Smoker -- 0.25 packs/day for 45 years  . Types: Cigarettes  . Start date: 02/05/1958  Smokeless tobacco  . Never Used    Comment: smokes 6-7cigarettes daily    Past Medical History Patient Active Problem List   Diagnosis Date Noted  . Chronic kidney disease (CKD), stage IV (severe) ([place]) 07/02/2015  . Nocturnal hypoxemia 04/04/2015  . Hyperlipidemia with target LDL less than 100 12/31/2014  . Osteoporosis, post-menopausal 11/01/2013  . GERD (gastroesophageal reflux disease) 07/13/2013  . Diarrhea 07/13/2013  . Aortic stenosis 06/27/2013  . Acute on chronic diastolic CHF (congestive heart failure) ([place]) 06/27/2013  . Vitamin D deficiency   . Tobacco abuse 05/12/2012  . Abdominal wall hernia 10/19/2011  . CAD (coronary artery disease)   . Pacemaker-St.Jude   .  Tachycardia-bradycardia syndrome ([place]) 12/03/2010  . Chronic anticoagulation 12/03/2010  . Anxiety 12/03/2010  . Mitral stenosis 05/30/2010  . Anemia 05/16/2010  . Diabetes ([place]) 01/19/2009  . Essential hypertension 01/19/2009  . Permanent atrial fibrillation ([place]) 01/19/2009  . COPD GOLD O  01/19/2009  . HEART MURMUR, BENIGN 01/19/2009        Objective:    BP 106/62 mmHg  Pulse 65  Temp(Src) 97.8 F (36.6 C) (Oral)  Ht 5\' 2"  (1.575 m)  Wt 133 lb (60.328 kg)  BMI 24.32 kg/m2  SpO2 99%  Wt Readings from Last 3 Encounters:  10/16/15 133 lb (60.328 kg)  10/04/15 134 lb 9.6 oz (61.054 kg)  07/01/15 146 lb (66.225 kg)     Gen: NAD, alert, cooperative with exam, NCAT EYES: EOMI, no scleral injection or icterus ENT: OP without erythema LYMPH: no cervical LAD CV: NRRR, normal S1/S2, no murmur, distal pulses 2+ b/l Resp: moving air fair, expiratory wheeze throughout. comfortable WOB Abd: +BS, soft, mildly tender with deep palpation, ND. no guarding or organomegaly Ext: No edema, warm Neuro: Alert and oriented MSK: normal muscle bulk     Assessment & Plan:    Sarann was seen today for cough and loose stools, likely started due to virus, will treat for COPD exacerbation with wheezing. Not on warfarin now as pt says she wasn't told to start back after being told to stop for 4 steroid injections with pain management. She is to start back now. With start of doxycycline, needs to come  in on Monday for recheck INR.   Diagnoses and all orders for this visit:  COPD exacerbation ([place]) -     doxycycline (ADOXA) 100 MG tablet; Take 1 tablet (100 mg total) by mouth 2 (two) times daily. -     predniSONE (DELTASONE) 20 MG tablet; 2 po at same time daily for 5 days  Permanent atrial fibrillation (HCC) On anticoagulation  Chronic anticoagulation Restart warfarin  Tobacco abuse Cont to encourage cessation    Follow up plan: Return in about 5 days (around 10/21/2015) for INR  check.  Assunta Found, MD [facility] 10/16/2015, 3:21 PM

## 2015-10-16 NOTE — Patient Instructions (Addendum)
Take warfarin daily Come back on Monday for INR check  Take duonebs at home 4 times a day while wheezing

## 2015-10-16 NOTE — Telephone Encounter (Signed)
Scripts for doxycycline and prednisone called to [facility] and cancelled at the mail order.

## 2015-10-21 ENCOUNTER — Encounter: Payer: Self-pay | Admitting: Pharmacist

## 2015-10-22 ENCOUNTER — Encounter: Payer: Self-pay | Admitting: Nurse Practitioner

## 2015-10-23 ENCOUNTER — Other Ambulatory Visit: Payer: Self-pay | Admitting: Nurse Practitioner

## 2015-10-24 ENCOUNTER — Ambulatory Visit (INDEPENDENT_AMBULATORY_CARE_PROVIDER_SITE_OTHER): Payer: Commercial Managed Care - HMO | Admitting: Pharmacist

## 2015-10-24 DIAGNOSIS — R5383 Other fatigue: Secondary | ICD-10-CM

## 2015-10-24 DIAGNOSIS — Z7901 Long term (current) use of anticoagulants: Secondary | ICD-10-CM

## 2015-10-24 DIAGNOSIS — I482 Chronic atrial fibrillation: Secondary | ICD-10-CM

## 2015-10-24 DIAGNOSIS — I4821 Permanent atrial fibrillation: Secondary | ICD-10-CM

## 2015-10-24 LAB — COAGUCHEK XS/INR WAIVED
INR: 1.3 — ABNORMAL HIGH (ref 0.9–1.1)
Prothrombin Time: 15.8 s

## 2015-10-24 NOTE — Patient Instructions (Signed)
Anticoagulation Dose Instructions as of 10/24/2015      Dorene Grebe Tue Wed Thu Fri Sat   New Dose 1 mg 2 mg 1 mg 1 mg 2 mg 1 mg 1 mg    Description        Take 2 tablets (= 4mg ) today and tomorrow.  Then restart usual dose of warfarin for 2mg  mondays and thursdays.  Take 1/2 tablet all other days.      INR was 1.3 today (too thick - goal is 2.0 to 3.0)

## 2015-10-25 LAB — CBC WITH DIFFERENTIAL/PLATELET
BASOS: 0 %
Basophils Absolute: 0 10*3/uL (ref 0.0–0.2)
EOS (ABSOLUTE): 0.1 10*3/uL (ref 0.0–0.4)
Eos: 1 %
HEMATOCRIT: 33.6 % — AB (ref 34.0–46.6)
HEMOGLOBIN: 10.8 g/dL — AB (ref 11.1–15.9)
IMMATURE GRANULOCYTES: 0 %
Immature Grans (Abs): 0 10*3/uL (ref 0.0–0.1)
Lymphocytes Absolute: 2.4 10*3/uL (ref 0.7–3.1)
Lymphs: 29 %
MCH: 29.8 pg (ref 26.6–33.0)
MCHC: 32.1 g/dL (ref 31.5–35.7)
MCV: 93 fL (ref 79–97)
MONOCYTES: 6 %
Monocytes Absolute: 0.5 10*3/uL (ref 0.1–0.9)
NEUTROS PCT: 64 %
Neutrophils Absolute: 5.3 10*3/uL (ref 1.4–7.0)
Platelets: 228 10*3/uL (ref 150–379)
RBC: 3.62 x10E6/uL — AB (ref 3.77–5.28)
RDW: 15.1 % (ref 12.3–15.4)
WBC: 8.3 10*3/uL (ref 3.4–10.8)

## 2015-10-28 ENCOUNTER — Other Ambulatory Visit: Payer: Self-pay | Admitting: Nurse Practitioner

## 2015-10-28 ENCOUNTER — Other Ambulatory Visit: Payer: Self-pay | Admitting: Pediatrics

## 2015-10-28 DIAGNOSIS — F419 Anxiety disorder, unspecified: Secondary | ICD-10-CM

## 2015-10-29 ENCOUNTER — Other Ambulatory Visit: Payer: Self-pay | Admitting: Pharmacist

## 2015-10-29 ENCOUNTER — Other Ambulatory Visit: Payer: Self-pay | Admitting: Nurse Practitioner

## 2015-10-29 MED ORDER — FERROUS SULFATE 325 (65 FE) MG PO TABS: 325.0000 mg | ORAL_TABLET | Freq: Every day | ORAL | Status: AC

## 2015-10-29 MED ORDER — CLONAZEPAM 0.5 MG PO TABS: 0.5000 mg | ORAL_TABLET | Freq: Three times a day (TID) | ORAL | Status: DC | PRN

## 2015-10-29 NOTE — Telephone Encounter (Signed)
Please call in klonopin with 1 refills 

## 2015-10-29 NOTE — Telephone Encounter (Signed)
Last seen 10/16/15  Dr Evette Doffing  MMM PCP  If approved route to nurse to call into [facility]

## 2015-10-29 NOTE — Telephone Encounter (Signed)
Last seen and filled 10/04/15. [facility]

## 2015-10-29 NOTE — Telephone Encounter (Signed)
Prescription called in

## 2015-10-30 ENCOUNTER — Other Ambulatory Visit: Payer: Self-pay | Admitting: Pediatrics

## 2015-10-30 NOTE — Telephone Encounter (Signed)
Last seen 10/16/15  Dr Evette Doffing

## 2015-10-31 DIAGNOSIS — M545 Low back pain: Secondary | ICD-10-CM | POA: Diagnosis not present

## 2015-10-31 DIAGNOSIS — M4806 Spinal stenosis, lumbar region: Secondary | ICD-10-CM | POA: Diagnosis not present

## 2015-11-01 ENCOUNTER — Encounter: Payer: Self-pay | Admitting: Pharmacist

## 2015-11-01 ENCOUNTER — Ambulatory Visit (INDEPENDENT_AMBULATORY_CARE_PROVIDER_SITE_OTHER): Payer: Commercial Managed Care - HMO | Admitting: Pharmacist

## 2015-11-01 VITALS — BP 92/58 | HR 68 | Ht 62.0 in | Wt 134.0 lb

## 2015-11-01 DIAGNOSIS — E871 Hypo-osmolality and hyponatremia: Secondary | ICD-10-CM | POA: Diagnosis not present

## 2015-11-01 DIAGNOSIS — Z0000 Encounter for general adult medical examination without abnormal findings: Secondary | ICD-10-CM

## 2015-11-01 DIAGNOSIS — R5383 Other fatigue: Secondary | ICD-10-CM

## 2015-11-01 DIAGNOSIS — I482 Chronic atrial fibrillation: Secondary | ICD-10-CM | POA: Diagnosis not present

## 2015-11-01 DIAGNOSIS — I10 Essential (primary) hypertension: Secondary | ICD-10-CM

## 2015-11-01 DIAGNOSIS — D649 Anemia, unspecified: Secondary | ICD-10-CM | POA: Diagnosis not present

## 2015-11-01 DIAGNOSIS — Z1211 Encounter for screening for malignant neoplasm of colon: Secondary | ICD-10-CM

## 2015-11-01 DIAGNOSIS — Z23 Encounter for immunization: Secondary | ICD-10-CM

## 2015-11-01 DIAGNOSIS — I4821 Permanent atrial fibrillation: Secondary | ICD-10-CM

## 2015-11-01 LAB — COAGUCHEK XS/INR WAIVED
INR: 2.8 — ABNORMAL HIGH (ref 0.9–1.1)
Prothrombin Time: 33.5 s

## 2015-11-01 NOTE — Patient Instructions (Addendum)
Anticoagulation Dose Instructions as of 11/01/2015      [name] Tue Wed Thu Fri Sat   New Dose 1 mg 2 mg 1 mg 1 mg 2 mg 1 mg 1 mg    Description        Continue current dose of warfarin for 2mg  mondays and thursdays.  Take 1/2 tablet all other days.      INR was 2.8 today in office  [name] , Thank you for taking time to come for your Medicare Wellness Visit. I appreciate your ongoing commitment to your health goals. Please review the following plan we discussed and let me know if I can assist you in the future.   These are the goals we discussed: Very important to have yearly eye exam for diabetes - you are due now Return stool test to office  Decrease furosemide to 1/2 tablet once a day Try chair exercises form handout.   Increase non-starchy vegetables - carrots, green bean, squash, zucchini, tomatoes, onions, peppers, spinach and other green leafy vegetables, cabbage, lettuce, cucumbers, asparagus, okra (not fried), eggplant limit sugar and processed foods (cakes, cookies, ice cream, crackers and chips) Increase fresh fruit but limit serving sizes 1/2 cup or about the size of tennis or baseball limit red meat to no more than 1-2 times per week (serving size about the size of your palm) Choose whole grains / lean proteins - whole wheat bread, quinoa, whole grain rice (1/2 cup), fish, chicken, [place]   This is a list of the screening recommended for you and due dates:  Health Maintenance  Topic Date Due  . Eye exam for diabetics  03/23/2014 - [facility]  [phone]  . Tetanus Vaccine  04/02/2016*  . DEXA scan (bone density measurement)  11/02/2015  . Flu Shot  03/10/2016  . Hemoglobin A1C  04/02/2016  . Complete foot exam   06/30/2016  . Shingles Vaccine  Completed  . Pneumonia vaccines  Completed  *Topic was postponed. The date shown is not the original due date.   Fall Prevention in the Home  Falls can cause injuries and can affect people from all age groups.  There are many simple things that you can do to make your home safe and to help prevent falls. WHAT CAN I DO ON THE OUTSIDE OF MY HOME?  Regularly repair the edges of walkways and driveways and fix any cracks.  Remove high doorway thresholds.  Trim any shrubbery on the main path into your home.  Use bright outdoor lighting.  Clear walkways of debris and clutter, including tools and rocks.  Regularly check that handrails are securely fastened and in good repair. Both sides of any steps should have handrails.  Install guardrails along the edges of any raised decks or porches.  Have leaves, snow, and ice cleared regularly.  Use sand or salt on walkways during winter months.  In the garage, clean up any spills right away, including grease or oil spills. WHAT CAN I DO IN THE BATHROOM?  Use night lights.  Install grab bars by the toilet and in the tub and shower. Do not use towel bars as grab bars.  Use non-skid mats or decals on the floor of the tub or shower.  If you need to sit down while you are in the shower, use a plastic, non-slip stool.[name]  Keep the floor dry. Immediately clean up any water that spills on the floor.  Remove soap buildup in the tub or shower on a regular  basis.  Attach bath mats securely with double-sided non-slip rug tape.  Remove throw rugs and other tripping hazards from the floor. WHAT CAN I DO IN THE BEDROOM?  Use night lights.  Make sure that a bedside light is easy to reach.  Do not use oversized bedding that drapes onto the floor.  Have a firm chair that has side arms to use for getting dressed.  Remove throw rugs and other tripping hazards from the floor. WHAT CAN I DO IN THE KITCHEN?   Clean up any spills right away.  Avoid walking on wet floors.  Place frequently used items in easy-to-reach places.  If you need to reach for something above you, use a sturdy step stool that has a grab bar.  Keep electrical cables out of the  way.  Do not use floor polish or wax that makes floors slippery. If you have to use wax, make sure that it is non-skid floor wax.  Remove throw rugs and other tripping hazards from the floor. WHAT CAN I DO IN THE STAIRWAYS?  Do not leave any items on the stairs.  Make sure that there are handrails on both sides of the stairs. Fix handrails that are broken or loose. Make sure that handrails are as long as the stairways.  Check any carpeting to make sure that it is firmly attached to the stairs. Fix any carpet that is loose or worn.  Avoid having throw rugs at the top or bottom of stairways, or secure the rugs with carpet tape to prevent them from moving.  Make sure that you have a light switch at the top of the stairs and the bottom of the stairs. If you do not have them, have them installed. WHAT ARE SOME OTHER FALL PREVENTION TIPS?  Wear closed-toe shoes that fit well and support your feet. Wear shoes that have rubber soles or low heels.  When you use a stepladder, make sure that it is completely opened and that the sides are firmly locked. Have someone hold the ladder while you are using it. Do not climb a closed stepladder.  Add color or contrast paint or tape to grab bars and handrails in your home. Place contrasting color strips on the first and last steps.  Use mobility aids as needed, such as canes, walkers, scooters, and crutches.  Turn on lights if it is dark. Replace any light bulbs that burn out.  Set up furniture so that there are clear paths. Keep the furniture in the same spot.  Fix any uneven floor surfaces.  Choose a carpet design that does not hide the edge of steps of a stairway.  Be aware of any and all pets.  Review your medicines with your healthcare provider. Some medicines can cause dizziness or changes in blood pressure, which increase your risk of falling. Talk with your health care provider about other ways that you can decrease your risk of falls. This  may include working with a physical therapist or trainer to improve your strength, balance, and endurance.   This information is not intended to replace advice given to you by your health care provider. Make sure you discuss any questions you have with your health care provider.   Document Released: 07/17/2002 Document Revised: 12/11/2014 Document Reviewed: 08/31/2014 Elsevier Interactive Patient Education 2016 [employer].   Tdap Vaccine (Tetanus, Diphtheria and Pertussis): What You Need to Know 1. Why get vaccinated? Tetanus, diphtheria and pertussis are very serious diseases. Tdap vaccine can protect [place]  from these diseases. And, Tdap vaccine given to pregnant women can protect newborn babies against pertussis. TETANUS (Lockjaw) is rare in the [place] today. It causes painful muscle tightening and stiffness, usually all over the body.  It can lead to tightening of muscles in the head and neck so you can't open your mouth, swallow, or sometimes even breathe. Tetanus kills about 1 out of 10 people who are infected even after receiving the best medical care. DIPHTHERIA is also rare in the [place] today. It can cause a thick coating to form in the back of the throat.  It can lead to breathing problems, heart failure, paralysis, and death. PERTUSSIS (Whooping Cough) causes severe coughing spells, which can cause difficulty breathing, vomiting and disturbed sleep.  It can also lead to weight loss, incontinence, and rib fractures. Up to 2 in 100 adolescents and 5 in 100 adults with pertussis are hospitalized or have complications, which could include pneumonia or death. These diseases are caused by bacteria. Diphtheria and pertussis are spread from person to person through secretions from coughing or sneezing. Tetanus enters the body through cuts, scratches, or wounds. Before vaccines, as many as 200,000 cases of diphtheria, 200,000 cases of pertussis, and hundreds of cases of tetanus,  were reported in the [place] each year. Since vaccination began, reports of cases for tetanus and diphtheria have dropped by about 99% and for pertussis by about 80%. 2. Tdap vaccine Tdap vaccine can protect adolescents and adults from tetanus, diphtheria, and pertussis. One dose of Tdap is routinely given at age 67 or 42. People who did not get Tdap at that age should get it as soon as possible. Tdap is especially important for healthcare professionals and anyone having close contact with a baby younger than 12 months. Pregnant women should get a dose of Tdap during every pregnancy, to protect the newborn from pertussis. Infants are most at risk for severe, life-threatening complications from pertussis. Another vaccine, called Td, protects against tetanus and diphtheria, but not pertussis. A Td booster should be given every 10 years. Tdap may be given as one of these boosters if you have never gotten Tdap before. Tdap may also be given after a severe cut or burn to prevent tetanus infection. Your doctor or the person giving you the vaccine can give you more information. Tdap may safely be given at the same time as other vaccines. 3. Some people should not get this vaccine  A person who has ever had a life-threatening allergic reaction after a previous dose of any diphtheria, tetanus or pertussis containing vaccine, OR has a severe allergy to any part of this vaccine, should not get Tdap vaccine. Tell the person giving the vaccine about any severe allergies.  Anyone who had coma or long repeated seizures within 7 days after a childhood dose of DTP or DTaP, or a previous dose of Tdap, should not get Tdap, unless a cause other than the vaccine was found. They can still get Td.  Talk to your doctor if you:  have seizures or another nervous system problem,  had severe pain or swelling after any vaccine containing diphtheria, tetanus or pertussis,  ever had a condition called Guillain-Barr  Syndrome (GBS),  aren't feeling well on the day the shot is scheduled. 4. Risks With any medicine, including vaccines, there is a chance of side effects. These are usually mild and go away on their own. Serious reactions are also possible but are rare. Most people who get  Tdap vaccine do not have any problems with it. Mild problems following Tdap (Did not interfere with activities)  Pain where the shot was given (about 3 in 4 adolescents or 2 in 3 adults)  Redness or swelling where the shot was given (about 1 person in 5)  Mild fever of at least 100.59F (up to about 1 in 25 adolescents or 1 in 100 adults)  Headache (about 3 or 4 people in 10)  Tiredness (about 1 person in 3 or 4)  Nausea, vomiting, diarrhea, stomach ache (up to 1 in 4 adolescents or 1 in 10 adults)  Chills, sore joints (about 1 person in 10)  Body aches (about 1 person in 3 or 4)  Rash, swollen glands (uncommon) Moderate problems following Tdap (Interfered with activities, but did not require medical attention)  Pain where the shot was given (up to 1 in 5 or 6)  Redness or swelling where the shot was given (up to about 1 in 16 adolescents or 1 in 12 adults)  Fever over 102F (about 1 in 100 adolescents or 1 in 250 adults)  Headache (about 1 in 7 adolescents or 1 in 10 adults)  Nausea, vomiting, diarrhea, stomach ache (up to 1 or 3 people in 100)  Swelling of the entire arm where the shot was given (up to about 1 in 500). Severe problems following Tdap (Unable to perform usual activities; required medical attention)  Swelling, severe pain, bleeding and redness in the arm where the shot was given (rare). Problems that could happen after any vaccine:  People sometimes faint after a medical procedure, including vaccination. Sitting or lying down for about 15 minutes can help prevent fainting, and injuries caused by a fall. Tell your doctor if you feel dizzy, or have vision changes or ringing in the  ears.  Some people get severe pain in the shoulder and have difficulty moving the arm where a shot was given. This happens very rarely.  Any medication can cause a severe allergic reaction. Such reactions from a vaccine are very rare, estimated at fewer than 1 in a million doses, and would happen within a few minutes to a few hours after the vaccination. As with any medicine, there is a very remote chance of a vaccine causing a serious injury or death. The safety of vaccines is always being monitored. For more information, visit: http://www.aguilar.org/ 5. What if there is a serious problem? What should I look for?  Look for anything that concerns you, such as signs of a severe allergic reaction, very high fever, or unusual behavior.  Signs of a severe allergic reaction can include hives, swelling of the face and throat, difficulty breathing, a fast heartbeat, dizziness, and weakness. These would usually start a few minutes to a few hours after the vaccination. What should I do?  If you think it is a severe allergic reaction or other emergency that can't wait, call 9-1-1 or get the person to the nearest hospital. Otherwise, call your doctor.  Afterward, the reaction should be reported to the Vaccine Adverse Event Reporting System (VAERS). Your doctor might file this report, or you can do it yourself through the VAERS web site at www.vaers.SamedayNews.es, or by calling 360-397-8134. VAERS does not give medical advice.  6. The National Vaccine Injury Compensation Program The [employer] Vaccine Injury Compensation Program (VICP) is a federal program that was created to compensate people who may have been injured by certain vaccines. Persons who believe they may have been injured by  a vaccine can learn about the program and about filing a claim by calling 630-320-0501 or visiting the [employer] website at GoldCloset.com.ee. There is a time limit to file a claim for compensation. 7. How can I  learn more?  Ask your doctor. He or she can give you the vaccine package insert or suggest other sources of information.  Call your local or state health department.  Contact the Centers for Disease Control and Prevention (CDC):  Call (704) 687-6660 (1-800-CDC-INFO) or  Visit CDC's website at http://hunter.com/ CDC Tdap Vaccine VIS (10/03/13)   This information is not intended to replace advice given to you by your health care provider. Make sure you discuss any questions you have with your health care provider.   Document Released: 01/26/2012 Document Revised: 08/17/2014 Document Reviewed: 11/08/2013 Elsevier Interactive Patient Education [employer].

## 2015-11-01 NOTE — Progress Notes (Signed)
Patient ID: [name], female   DOB: 1934-08-25, 81 y.o.   MRN: [id number]   Subjective:   [name] is a 81 y.o. white female who presents for a Subsequent Medicare Annual Wellness Visit and to recheck INR.  INR last week was low at 1.3 due to stopping warfarin pre and post spinal injection.   Today patient states he main health concern is that she has been feeling fatigued recently and continues to have intermittent abdominal pain. At last week's visit Hbg was checked and was 10.8 which was similar to 6 months earlier.  Patient was instructed to start iron '325mg'$  qd. Patient has also been referred in recent past to GI (Dr Britta Mccreedy at [facility])- the recommendation was for patient to consider returning to previous GI as he felt tarting over with extensive work up would be expensive and repetatifve of what had already been done.  Review of Systems  Review of Systems  Constitutional: Positive for malaise/fatigue.  HENT: Negative.   Eyes: Positive for pain.  Respiratory: Negative.   Cardiovascular: Negative.   Gastrointestinal: Positive for abdominal pain.  Genitourinary: Negative.   Musculoskeletal: Positive for joint pain.  Skin: Negative.   Neurological: Negative.   Endo/Heme/Allergies: Bruises/bleeds easily (on warfarin).   Current Medications (verified) Outpatient Encounter Prescriptions as of 11/01/2015  Medication Sig  . albuterol (VENTOLIN HFA) 108 (90 BASE) MCG/ACT inhaler Inhale 1 to 2 puffs into lung every 4 to 6 hours as needed for SOB / wheezing (Patient taking differently: Inhale 2 puffs into the lungs every 4 (four) hours as needed (plan B). )  . Blood Glucose Calibration (TRUE METRIX LEVEL 1) LOW SOLN Use weekly  . Blood Glucose Monitoring Suppl (TRUE METRIX AIR GLUCOSE METER) DEVI 1 Device by Does not apply route 2 (two) times daily. Use to Test blood sugar bid. DX E13.10  . clonazePAM (KLONOPIN) 0.5 MG tablet Take 1 tablet (0.5 mg total) by mouth 3 (three) times  daily as needed for anxiety.  [name] CREON 36000 units CPEP capsule TAKE 3 CAPSULES WITH MEALS  AND TAKE 1 CAPSULE WITH A SNACK AS DIRECTED  . denosumab (PROLIA) 60 MG/ML SOLN injection INJECT 60 MG SQ every 6 months.  . diltiazem (CARDIZEM) 120 MG tablet TAKE 1 TABLET EVERY DAY  . furosemide (LASIX) 40 MG tablet Take 0.5 tablets (20 mg total) by mouth daily.  [name] glucose blood test strip True metrix test strips. Test bid. E13.10  . HYDROcodone-acetaminophen (NORCO/VICODIN) 5-325 MG tablet Take 1 tablet by mouth 3 (three) times daily as needed for moderate pain.  [name] ipratropium-albuterol (DUONEB) 0.5-2.5 (3) MG/3ML SOLN Take 3 mLs by nebulization every 4 (four) hours as needed. Plan C  . JANUVIA 50 MG tablet TAKE 1 TABLET EVERY DAY  . nitroGLYCERIN (NITROSTAT) 0.4 MG SL tablet Place 1 tablet (0.4 mg total) under the tongue every 5 (five) minutes as needed for chest pain.  . pantoprazole (PROTONIX) 40 MG tablet TAKE 1 TABLET TWICE DAILY  . potassium chloride SA (K-DUR,KLOR-CON) 20 MEQ tablet TAKE 1 TABLET EVERY MORNING  . Probiotic Product ([place]) CAPS Take 1 capsule by mouth daily.  . simvastatin (ZOCOR) 10 MG tablet TAKE 1 TABLET EVERY DAY  . SYMBICORT 160-4.5 MCG/ACT inhaler INHALE 2 PUFFS TWICE DAILY  . TRUEPLUS LANCETS 28G MISC Test Blood sugar bid. DX E13.10  . warfarin (COUMADIN) 2 MG tablet TAKE PER PHYSICIAN INSTRUCTIONS  . [DISCONTINUED] furosemide (LASIX) 40 MG tablet TAKE 1 TABLET EVERY DAY  . ferrous  sulfate 325 (65 FE) MG tablet Take 1 tablet (325 mg total) by mouth daily with breakfast.  . [DISCONTINUED] doxycycline (ADOXA) 100 MG tablet Take 1 tablet (100 mg total) by mouth 2 (two) times daily. (Patient not taking: Reported on 11/01/2015)  . [DISCONTINUED] predniSONE (DELTASONE) 20 MG tablet 2 po at same time daily for 5 days (Patient not taking: Reported on 11/01/2015)   No facility-administered encounter medications on file as of 11/01/2015.    Allergies  (verified) Torsemide; Macrobid; and Tramadol   History: Past Medical History  Diagnosis Date  . Permanent atrial fibrillation (HCC)     H/o tachybradycardia syndrome on Coumadin anticoagulation, has pacemaker.  [name] CAD (coronary artery disease) 2011    Catheterization August 2011: mild nonobstructive coronary artery disease complicated by large left rectus sheath hematoma status post evacuation Coumadin restarted without recurrence  . Diabetes mellitus   . Chronic airway obstruction, not elsewhere classified   . Anemia, unspecified   . Chronic kidney disease, stage II (mild)   . Unspecified hypertensive kidney disease with chronic kidney disease stage I through stage IV, or unspecified   . Tachycardia-bradycardia [facility])     s/p St. Jude single chamber PPM 10/2010   . GERD (gastroesophageal reflux disease)   . Abnormal CT of the chest     Mediastinal and hilar adenopathy followed by Dr. Koleen Nimrod in [place]  . Congestive heart failure, unspecified     EF now 60%, previous nonischemic cardiomyopathy  . Pacemaker     Single chamber [place]. Jude ACCENT [phone] SN 719 04/11/1959 10/31/2010  . Valvular heart disease     a. H/o moderate mitral stenosis by cath 2011 but no mention of this on 10/2011 echo. b. 10/2011 echo: mod MR, mild AS, mild TR; EF>65%  . Hypercholesterolemia   . Diverticulitis Dec 2012  . Body mass index (BMI) of 20.0-20.9 in adult FEB 2013 147 LBS  . Recurrent UTI   . Neurogenic sleep apnea     Uses noctural oxygen prescribed by her pulmonologi  . Rheumatic heart disease   . Gastritis     By EGD  . Carotid artery disease ([place])     Doppler, 05/2012, 0-39% bilateral  . Vitamin D deficiency   . Dilated bile duct 10/19/2011  . Mitral regurgitation 06/27/2013  . Cataract    Past Surgical History  Procedure Laterality Date  . Single-chamber pacemaker implantation  3/12    by Dr Caryl Comes  . Evacuation of retroperitoneal and left rectus sheath    . Cholecystectomy      40+ years  ago  . Hernia repair      X3  . Pilonidal cyst / sinus excision    . Esophagogastroduodenoscopy  10/2011    Fields-erosive gastritis, biopsy with no H. pylori, hiatal hernia, peptic duodenitis, no celiac disease, duodenal diverticulum, duodenal nodule  . Colonoscopy  10/2011    Dyann Ruddle sigmoid to descending diverticulosis, internal hemorrhoids  . Eus  11/16/11    Mishra-13 MM CBD, normal pancreatic duct, otherwise normal EUS  . Hematoma evacuation      femoral   Family History  Problem Relation Age of Onset  . Cancer Mother     cervical  . Stroke Mother   . Cancer Daughter 54    kidney, lung  . Heart disease Father   . Heart attack Father   . Ulcers Father   . Colon cancer Neg Hx   . Early death Sister 1    died at 44  months old  . Hypertension Brother   . Cancer Paternal Aunt     breast  . Hypertension Son    Social History   Occupational History  . RETIRED    Social History Main Topics  . Smoking status: Current Every Day Smoker -- 0.25 packs/day for 45 years    Types: Cigarettes    Start date: 02/05/1958  . Smokeless tobacco: Never Used     Comment: smokes 5-6 cigarettes daily  . Alcohol Use: No  . Drug Use: No  . Sexual Activity: Not Currently    Do you feel safe at home?  Yes  Dietary issues and exercise activities: Current Exercise Habits: The patient does not participate in regular exercise at present, Exercise limited by: orthopedic condition(s);respiratory conditions(s)  Current Dietary habits:  Limiting greens due to warfarin use, limiting sugar intake.    Objective:    Today's Vitals   11/01/15 1139  BP: 92/58  Pulse: 68  Height: _0  (1.575 m)  Weight: 134 lb (60.782 kg)  PainSc: 4   PainLoc: Abdomen   Body mass index is 24.5 kg/(m^2).  Activities of Daily Living In your present state of health, do you have any difficulty performing the following activities: 11/01/2015  Hearing? N  Vision? N  Difficulty concentrating or making  decisions? N  Walking or climbing stairs? Y  Dressing or bathing? N  Doing errands, shopping? N  Preparing Food and eating ? N  Using the Toilet? N  In the past six months, have you accidently leaked urine? N  Do you have problems with loss of bowel control? N  Managing your Medications? N  Managing your Finances? N  Housekeeping or managing your Housekeeping? N    Are there smokers in your home (other than you)? Yes   Cardiac Risk Factors include: advanced age (>50mn, >>59women);family history of premature cardiovascular disease;hypertension;sedentary lifestyle;smoking/ tobacco exposure  Depression Screen PHQ 2/9 Scores 11/01/2015 10/16/2015 07/01/2015 05/23/2015  PHQ - 2 Score 2 0 0 0  PHQ- 9 Score 8 - - -    Fall Risk Fall Risk  11/01/2015 10/16/2015 07/01/2015 05/23/2015 12/31/2014  Falls in the past year? Yes Yes No No No  Number falls in past yr: 1 1 - - -  Injury with Fall? Yes Yes - - -  Risk Factor Category  High Fall Risk High Fall Risk - - -  Risk for fall due to : - History of fall(s);Impaired balance/gait;Impaired mobility - - -  Follow up Falls prevention discussed - - - -    Cognitive Function: MMSE - Mini Mental State Exam 11/01/2015 11/01/2015 10/12/2014  Orientation to time 5 - 5  Orientation to Place 5 - 5  Registration _1 Attention/ Calculation 5 - 3  Recall _2 Language- name 2 objects 2 - 2  Language- repeat 1 - 1  Language- follow 3 step command 1 - 1  Language- read & follow direction 1 - 1  Write a sentence 1 - 1  Copy design 0 - 1  Total score 26 - 25    Immunizations and Health Maintenance Immunization History  Administered Date(s) Administered  . Influenza Split 05/12/2012  . Influenza,inj,Quad PF,36+ Mos 05/11/2013, 06/21/2014, 05/23/2015  . Pneumococcal Conjugate-13 06/21/2014  . Pneumococcal Polysaccharide-23 06/30/1995, 10/04/2015  . Tdap 11/01/2015  . Zoster 10/12/2014   Health Maintenance Due  Topic Date Due  . OPHTHALMOLOGY  EXAM  03/23/2014    Patient Care Team:  [name], FNP as PCP - General (Nurse Practitioner) Daneil Dolin, MD (Gastroenterology) Thompson Grayer, MD as Consulting Physician (Cardiology) Steffanie Rainwater, DPM as Consulting Physician (Podiatry) Danie Binder, MD as Consulting Physician (Gastroenterology) Herminio Commons, MD as Attending Physician (Cardiology) Okey Regal, OD as Consulting Physician (Optometry)  Indicate any recent Medical Services you may have received from other than [facility] providers in the past year (date may be approximate).    Assessment:    Annual Wellness Visit  Therapeutic anticoagulation Fatigue    Screening Tests Health Maintenance  Topic Date Due  . OPHTHALMOLOGY EXAM  03/23/2014  . TETANUS/TDAP  04/02/2016 (Originally 02/06/1954)  . DEXA SCAN  11/02/2015  . INFLUENZA VACCINE  03/10/2016  . HEMOGLOBIN A1C  04/02/2016  . FOOT EXAM  06/30/2016  . ZOSTAVAX  Completed  . PNA vac Low Risk Adult  Completed       Plan:   During the course of the visit Toshua was educated and counseled about the following appropriate screening and preventive services:   Vaccines are UTD except Tdap - given in office today  Colorectal cancer screening - FOBT given in office today  Cardiovascular disease - EKG UTD; lipid panel UTD  BP was low today - decrease furosemide 87m to 1/2 tablet qam. Patient to monitor weight at home and to call office if she experienced greater than 2# gain in 24 hours or 5# in 1 week.  Diabetes  - A1c is UTD and at goal  Bone Denisty / Osteoporosis Screening - ordered  Mammogram - no history of abnormal mammogram - no longer required.  PAP - no longer required  Glaucoma screening / Diabetic Eye Exam - needed.  Discussed importance of yearly eye exams.  Patient given number of [facility] to make appt.  Nutrition counseling - continue to follow CHO counting diet  Smoking cessation counseling - Discussed benefits of smoking  cessation - patient is trying to decrease and eventually stop.  Declines pharmacotherapeutic agents to assist in smoking cessation.  Advanced Directives - patient has AD information given from last year.  Continue current warfarin dose - recheck in 1 month  Orders Placed This Encounter  Procedures  . Fecal occult blood, imunochemical    Standing Status: Future     Number of Occurrences:      Standing Expiration Date: 01/31/2017  . DG Bone Density    Order Specific Question:  Reason for Exam (SYMPTOM  OR DIAGNOSIS REQUIRED)    Answer:  osteoporosis - last dexa 11/02/2015    Order Specific Question:  Preferred imaging location?    Answer:  Internal  . Tdap vaccine greater than or equal to 7yo IM  . Anemia Profile B  . BMP8+EGFR  . Thyroid Panel With TSH     Patient Instructions (the written plan) were given to the patient.   ECherre Robins PResearch Surgical Center LLC  11/01/2015

## 2015-11-02 LAB — THYROID PANEL WITH TSH
Free Thyroxine Index: 2.1 (ref 1.2–4.9)
T3 UPTAKE RATIO: 32 % (ref 24–39)
T4 TOTAL: 6.6 ug/dL (ref 4.5–12.0)
TSH: 3.15 u[IU]/mL (ref 0.450–4.500)

## 2015-11-02 LAB — ANEMIA PROFILE B
BASOS: 0 %
Basophils Absolute: 0 10*3/uL (ref 0.0–0.2)
EOS (ABSOLUTE): 0.1 10*3/uL (ref 0.0–0.4)
EOS: 1 %
FERRITIN: 88 ng/mL (ref 15–150)
FOLATE: 6.1 ng/mL (ref >3.0)
HEMATOCRIT: 34.6 % (ref 34.0–46.6)
HEMOGLOBIN: 11.2 g/dL (ref 11.1–15.9)
IMMATURE GRANS (ABS): 0 10*3/uL (ref 0.0–0.1)
IMMATURE GRANULOCYTES: 0 %
IRON SATURATION: 30 % (ref 15–55)
IRON: 75 ug/dL (ref 27–139)
LYMPHS ABS: 2.1 10*3/uL (ref 0.7–3.1)
Lymphs: 31 %
MCH: 30.3 pg (ref 26.6–33.0)
MCHC: 32.4 g/dL (ref 31.5–35.7)
MCV: 94 fL (ref 79–97)
MONOS ABS: 0.5 10*3/uL (ref 0.1–0.9)
Monocytes: 7 %
NEUTROS PCT: 61 %
Neutrophils Absolute: 4.1 10*3/uL (ref 1.4–7.0)
Platelets: 178 10*3/uL (ref 150–379)
RBC: 3.7 x10E6/uL — ABNORMAL LOW (ref 3.77–5.28)
RDW: 15.5 % — ABNORMAL HIGH (ref 12.3–15.4)
Retic Ct Pct: 0.6 % (ref 0.6–2.6)
TIBC: 248 ug/dL — AB (ref 250–450)
UIBC: 173 ug/dL (ref 118–369)
Vitamin B-12: 299 pg/mL (ref 211–946)
WBC: 6.8 10*3/uL (ref 3.4–10.8)

## 2015-11-02 LAB — BMP8+EGFR
BUN/Creatinine Ratio: 21 (ref 11–26)
BUN: 33 mg/dL — ABNORMAL HIGH (ref 8–27)
CALCIUM: 9.1 mg/dL (ref 8.7–10.3)
CO2: 22 mmol/L (ref 18–29)
CREATININE: 1.54 mg/dL — AB (ref 0.57–1.00)
Chloride: 96 mmol/L (ref 96–106)
GFR, EST AFRICAN AMERICAN: 37 mL/min/{1.73_m2} — AB (ref >59)
GFR, EST NON AFRICAN AMERICAN: 32 mL/min/{1.73_m2} — AB (ref >59)
Glucose: 119 mg/dL — ABNORMAL HIGH (ref 65–99)
Potassium: 4 mmol/L (ref 3.5–5.2)
Sodium: 136 mmol/L (ref 134–144)

## 2015-11-07 DIAGNOSIS — J188 Other pneumonia, unspecified organism: Secondary | ICD-10-CM | POA: Diagnosis not present

## 2015-11-07 DIAGNOSIS — J449 Chronic obstructive pulmonary disease, unspecified: Secondary | ICD-10-CM | POA: Diagnosis not present

## 2015-11-09 DIAGNOSIS — E860 Dehydration: Secondary | ICD-10-CM | POA: Diagnosis not present

## 2015-11-09 DIAGNOSIS — J441 Chronic obstructive pulmonary disease with (acute) exacerbation: Secondary | ICD-10-CM | POA: Diagnosis not present

## 2015-11-09 DIAGNOSIS — R109 Unspecified abdominal pain: Secondary | ICD-10-CM | POA: Diagnosis not present

## 2015-11-09 DIAGNOSIS — F17200 Nicotine dependence, unspecified, uncomplicated: Secondary | ICD-10-CM | POA: Diagnosis not present

## 2015-11-09 DIAGNOSIS — R05 Cough: Secondary | ICD-10-CM | POA: Diagnosis not present

## 2015-11-09 DIAGNOSIS — K219 Gastro-esophageal reflux disease without esophagitis: Secondary | ICD-10-CM | POA: Diagnosis not present

## 2015-11-09 DIAGNOSIS — Z79899 Other long term (current) drug therapy: Secondary | ICD-10-CM | POA: Diagnosis not present

## 2015-11-09 DIAGNOSIS — J440 Chronic obstructive pulmonary disease with acute lower respiratory infection: Secondary | ICD-10-CM | POA: Diagnosis not present

## 2015-11-09 DIAGNOSIS — N289 Disorder of kidney and ureter, unspecified: Secondary | ICD-10-CM | POA: Diagnosis not present

## 2015-11-09 DIAGNOSIS — J40 Bronchitis, not specified as acute or chronic: Secondary | ICD-10-CM | POA: Diagnosis not present

## 2015-11-09 DIAGNOSIS — R197 Diarrhea, unspecified: Secondary | ICD-10-CM | POA: Diagnosis not present

## 2015-11-09 DIAGNOSIS — E119 Type 2 diabetes mellitus without complications: Secondary | ICD-10-CM | POA: Diagnosis not present

## 2015-11-12 ENCOUNTER — Other Ambulatory Visit: Payer: Self-pay | Admitting: Nurse Practitioner

## 2015-11-14 ENCOUNTER — Telehealth: Payer: Self-pay | Admitting: *Deleted

## 2015-11-14 NOTE — Telephone Encounter (Signed)
Rx request was faxed to the [facility] by mistake. [facility] requesting refill on ipratropium-albuterol (DUONEB) 0.5-2.5 (3) MG/3ML SOLN

## 2015-11-15 MED ORDER — IPRATROPIUM-ALBUTEROL 0.5-2.5 (3) MG/3ML IN SOLN: 3.0000 mL | RESPIRATORY_TRACT | Status: DC | PRN

## 2015-11-18 ENCOUNTER — Ambulatory Visit (INDEPENDENT_AMBULATORY_CARE_PROVIDER_SITE_OTHER): Payer: Commercial Managed Care - HMO | Admitting: *Deleted

## 2015-11-18 ENCOUNTER — Telehealth: Payer: Self-pay | Admitting: Cardiology

## 2015-11-18 DIAGNOSIS — I495 Sick sinus syndrome: Secondary | ICD-10-CM | POA: Diagnosis not present

## 2015-11-18 NOTE — Telephone Encounter (Signed)
Spoke with pt and reminded pt of remote transmission that is due today. Pt verbalized understanding.   

## 2015-11-18 NOTE — Progress Notes (Signed)
Remote pacemaker transmission.   

## 2015-11-20 DIAGNOSIS — M25511 Pain in right shoulder: Secondary | ICD-10-CM | POA: Diagnosis not present

## 2015-11-20 DIAGNOSIS — S4991XA Unspecified injury of right shoulder and upper arm, initial encounter: Secondary | ICD-10-CM | POA: Diagnosis not present

## 2015-11-20 DIAGNOSIS — S40011A Contusion of right shoulder, initial encounter: Secondary | ICD-10-CM | POA: Diagnosis not present

## 2015-11-20 DIAGNOSIS — T148 Other injury of unspecified body region: Secondary | ICD-10-CM | POA: Diagnosis not present

## 2015-11-25 ENCOUNTER — Emergency Department (HOSPITAL_COMMUNITY)
Admission: EM | Admit: 2015-11-25 | Discharge: 2015-11-26 | Disposition: A | Discharge status: Home / Self Care | Payer: Commercial Managed Care - HMO | Attending: Emergency Medicine | Admitting: Emergency Medicine

## 2015-11-25 ENCOUNTER — Emergency Department (HOSPITAL_COMMUNITY): Payer: Commercial Managed Care - HMO

## 2015-11-25 ENCOUNTER — Encounter (HOSPITAL_COMMUNITY): Payer: Self-pay | Admitting: Emergency Medicine

## 2015-11-25 DIAGNOSIS — R197 Diarrhea, unspecified: Secondary | ICD-10-CM

## 2015-11-25 DIAGNOSIS — R531 Weakness: Secondary | ICD-10-CM | POA: Diagnosis present

## 2015-11-25 DIAGNOSIS — Z950 Presence of cardiac pacemaker: Secondary | ICD-10-CM | POA: Diagnosis not present

## 2015-11-25 DIAGNOSIS — J441 Chronic obstructive pulmonary disease with (acute) exacerbation: Secondary | ICD-10-CM | POA: Diagnosis not present

## 2015-11-25 DIAGNOSIS — R109 Unspecified abdominal pain: Secondary | ICD-10-CM | POA: Diagnosis not present

## 2015-11-25 DIAGNOSIS — F17210 Nicotine dependence, cigarettes, uncomplicated: Secondary | ICD-10-CM | POA: Insufficient documentation

## 2015-11-25 DIAGNOSIS — R1012 Left upper quadrant pain: Secondary | ICD-10-CM | POA: Diagnosis not present

## 2015-11-25 DIAGNOSIS — E119 Type 2 diabetes mellitus without complications: Secondary | ICD-10-CM | POA: Diagnosis not present

## 2015-11-25 DIAGNOSIS — I2510 Atherosclerotic heart disease of native coronary artery without angina pectoris: Secondary | ICD-10-CM | POA: Diagnosis not present

## 2015-11-25 DIAGNOSIS — Z79899 Other long term (current) drug therapy: Secondary | ICD-10-CM | POA: Diagnosis not present

## 2015-11-25 DIAGNOSIS — I130 Hypertensive heart and chronic kidney disease with heart failure and stage 1 through stage 4 chronic kidney disease, or unspecified chronic kidney disease: Secondary | ICD-10-CM | POA: Insufficient documentation

## 2015-11-25 DIAGNOSIS — N182 Chronic kidney disease, stage 2 (mild): Secondary | ICD-10-CM | POA: Diagnosis not present

## 2015-11-25 DIAGNOSIS — I509 Heart failure, unspecified: Secondary | ICD-10-CM | POA: Diagnosis not present

## 2015-11-25 DIAGNOSIS — R05 Cough: Secondary | ICD-10-CM | POA: Diagnosis not present

## 2015-11-25 DIAGNOSIS — Z7901 Long term (current) use of anticoagulants: Secondary | ICD-10-CM | POA: Diagnosis not present

## 2015-11-25 LAB — URINALYSIS, ROUTINE W REFLEX MICROSCOPIC
BILIRUBIN URINE: NEGATIVE
GLUCOSE, UA: NEGATIVE mg/dL
HGB URINE DIPSTICK: NEGATIVE
Ketones, ur: NEGATIVE mg/dL
Leukocytes, UA: NEGATIVE
Nitrite: NEGATIVE
Protein, ur: NEGATIVE mg/dL
SPECIFIC GRAVITY, URINE: 1.01 (ref 1.005–1.030)
pH: 7 (ref 5.0–8.0)

## 2015-11-25 LAB — BASIC METABOLIC PANEL
ANION GAP: 10 (ref 5–15)
BUN: 20 mg/dL (ref 6–20)
CALCIUM: 7.8 mg/dL — AB (ref 8.9–10.3)
CO2: 24 mmol/L (ref 22–32)
CREATININE: 1.68 mg/dL — AB (ref 0.44–1.00)
Chloride: 103 mmol/L (ref 101–111)
GFR, EST AFRICAN AMERICAN: 33 mL/min — AB (ref >60)
GFR, EST NON AFRICAN AMERICAN: 28 mL/min — AB (ref >60)
GLUCOSE: 97 mg/dL (ref 65–99)
Potassium: 3.4 mmol/L — ABNORMAL LOW (ref 3.5–5.1)
Sodium: 137 mmol/L (ref 135–145)

## 2015-11-25 LAB — CBC
HCT: 34.3 % — ABNORMAL LOW (ref 36.0–46.0)
HEMOGLOBIN: 11.3 g/dL — AB (ref 12.0–15.0)
MCH: 31.8 pg (ref 26.0–34.0)
MCHC: 32.9 g/dL (ref 30.0–36.0)
MCV: 96.6 fL (ref 78.0–100.0)
PLATELETS: 196 10*3/uL (ref 150–400)
RBC: 3.55 MIL/uL — AB (ref 3.87–5.11)
RDW: 15.7 % — ABNORMAL HIGH (ref 11.5–15.5)
WBC: 9 10*3/uL (ref 4.0–10.5)

## 2015-11-25 LAB — PROTIME-INR
INR: 3.01 — ABNORMAL HIGH (ref 0.00–1.49)
Prothrombin Time: 30.7 seconds — ABNORMAL HIGH (ref 11.6–15.2)

## 2015-11-25 LAB — TROPONIN I: Troponin I: <0.03 ng/mL (ref <0.031)

## 2015-11-25 LAB — LIPASE, BLOOD: LIPASE: 67 U/L — AB (ref 11–51)

## 2015-11-25 MED ORDER — IPRATROPIUM-ALBUTEROL 0.5-2.5 (3) MG/3ML IN SOLN
3.0000 mL | Freq: Once | RESPIRATORY_TRACT | Status: AC
  Administered 2015-11-25: 3 mL via RESPIRATORY_TRACT
  Filled 2015-11-25: qty 3

## 2015-11-25 NOTE — ED Provider Notes (Signed)
CSN: [id number]     Arrival date & time 11/25/15  1824 History  By signing my name below, I, Irene Pap, attest that this documentation has been prepared under the direction and in the presence of Ezequiel Essex, MD. Electronically Signed: Irene Pap, ED Scribe. 11/25/2015. 10:40 PM.  Chief Complaint  Patient presents with  . Weakness   The history is provided by the patient. No language interpreter was used.  HPI Comments: [name] is a 81 y.o. female with a hx of A-Fib, CAD, DM, chronic airway obstruction, CKD, tachycardia-bradycardia, CHF, pacemaker, carotid artery disease, COPD, and valvular heart disease who presents to the Emergency Department complaining of diarrhea 3-4x a day onset 1.5 weeks ago. Pt reports associated generalized weakness, dry cough, congestion, and diffuse stomach "burning; it feels like a ball of fire." She reports that her daughter and granddaughter had similar symptoms. She states that she has an appetite, but it is decreased. Pt recently finished a course of antibiotics last week for bronchitis. She reports hx of appendectomy and cholecystectomy. Pt is on Coumadin. Pt states that she fell last week and was evaluated at Urgent Care. She did not hit her head or experience LOC. Pt does not wear oxygen at home. She denies recent travel outside of the country, fever, chest pain, vomiting, or dizziness.   Past Medical History  Diagnosis Date  . Permanent atrial fibrillation (HCC)     H/o tachybradycardia syndrome on Coumadin anticoagulation, has pacemaker.  [name] CAD (coronary artery disease) 2011    Catheterization August 2011: mild nonobstructive coronary artery disease complicated by large left rectus sheath hematoma status post evacuation Coumadin restarted without recurrence  . Diabetes mellitus   . Chronic airway obstruction, not elsewhere classified   . Anemia, unspecified   . Chronic kidney disease, stage II (mild)   . Unspecified hypertensive kidney  disease with chronic kidney disease stage I through stage IV, or unspecified   . Tachycardia-bradycardia [employer])     s/p St. Jude single chamber PPM 10/2010   . GERD (gastroesophageal reflux disease)   . Abnormal CT of the chest     Mediastinal and hilar adenopathy followed by Dr. Koleen Nimrod in [place]  . Congestive heart failure, unspecified     EF now 60%, previous nonischemic cardiomyopathy  . Pacemaker     Single chamber [place]. Jude ACCENT [phone] SN 719 04/11/1957 10/31/2010  . Valvular heart disease     a. H/o moderate mitral stenosis by cath 2011 but no mention of this on 10/2011 echo. b. 10/2011 echo: mod MR, mild AS, mild TR; EF>65%  . Hypercholesterolemia   . Diverticulitis Dec 2012  . Body mass index (BMI) of 20.0-20.9 in adult FEB 2013 147 LBS  . Recurrent UTI   . Neurogenic sleep apnea     Uses noctural oxygen prescribed by her pulmonologi  . Rheumatic heart disease   . Gastritis     By EGD  . Carotid artery disease ([place])     Doppler, 05/2012, 0-39% bilateral  . Vitamin D deficiency   . Dilated bile duct 10/19/2011  . Mitral regurgitation 06/27/2013  . Cataract    Past Surgical History  Procedure Laterality Date  . Single-chamber pacemaker implantation  3/12    by Dr Caryl Comes  . Evacuation of retroperitoneal and left rectus sheath    . Cholecystectomy      40+ years ago  . Hernia repair      X3  . Pilonidal cyst / sinus excision    .  Esophagogastroduodenoscopy  10/2011    Fields-erosive gastritis, biopsy with no H. pylori, hiatal hernia, peptic duodenitis, no celiac disease, duodenal diverticulum, duodenal nodule  . Colonoscopy  10/2011    Dyann Ruddle sigmoid to descending diverticulosis, internal hemorrhoids  . Eus  11/16/11    Mishra-13 MM CBD, normal pancreatic duct, otherwise normal EUS  . Hematoma evacuation      femoral   Family History  Problem Relation Age of Onset  . Cancer Mother     cervical  . Stroke Mother   . Cancer Daughter 39    kidney, lung  . Heart  disease Father   . Heart attack Father   . Ulcers Father   . Colon cancer Neg Hx   . Early death Sister 1    died at 59 months old  . Hypertension Brother   . Cancer Paternal Aunt     breast  . Hypertension Son    Social History  Substance Use Topics  . Smoking status: Current Every Day Smoker -- 0.25 packs/day for 45 years    Types: Cigarettes    Start date: 02/05/1958  . Smokeless tobacco: Never Used     Comment: smokes 5-6 cigarettes daily  . Alcohol Use: No   OB History    No data available     Review of Systems A complete 10 system review of systems was obtained and all systems are negative except as noted in the HPI and PMH.   Allergies  Torsemide; Macrobid; and Tramadol  Home Medications   Prior to Admission medications   Medication Sig Start Date End Date Taking? Authorizing Provider  acetaminophen (TYLENOL) 500 MG tablet Take 1,000 mg by mouth every 6 (six) hours as needed.   Yes Historical Provider, MD  clonazePAM (KLONOPIN) 0.5 MG tablet Take 1 tablet (0.5 mg total) by mouth 3 (three) times daily as needed for anxiety. 10/29/15  Yes [name], FNP  CREON 36000 units CPEP capsule TAKE 3 CAPSULES WITH MEALS  AND TAKE 1 CAPSULE WITH A SNACK AS DIRECTED 10/30/15  Yes Eustaquio Maize, MD  denosumab (PROLIA) 60 MG/ML SOLN injection INJECT 60 MG SQ every 6 months. Patient taking differently: Inject 60 mg into the skin every 6 (six) months. INJECT 60 MG SQ every 6 months. 07/24/15  Yes [name], FNP  diltiazem (CARDIZEM) 120 MG tablet TAKE 1 TABLET EVERY DAY 06/26/15  Yes Eustaquio Maize, MD  ferrous sulfate 325 (65 FE) MG tablet Take 1 tablet (325 mg total) by mouth daily with breakfast. 10/29/15  Yes Tammy Eckard, PHARMD  furosemide (LASIX) 40 MG tablet Take 40 mg by mouth daily.  11/01/15  Yes Tammy Eckard, PHARMD  HYDROcodone-acetaminophen (NORCO/VICODIN) 5-325 MG tablet Take 1 tablet by mouth 3 (three) times daily as needed for moderate pain.  09/23/15  Yes [name], FNP  JANUVIA 50 MG tablet TAKE 1 TABLET EVERY DAY 10/07/15  Yes [name], FNP  nitroGLYCERIN (NITROSTAT) 0.4 MG SL tablet Place 1 tablet (0.4 mg total) under the tongue every 5 (five) minutes as needed for chest pain. 07/20/14  Yes Herminio Commons, MD  pantoprazole (PROTONIX) 40 MG tablet TAKE 1 TABLET TWICE DAILY 10/07/15  Yes [name], FNP  potassium chloride SA (K-DUR,KLOR-CON) 20 MEQ tablet TAKE 1 TABLET EVERY MORNING 10/07/15  Yes [name], FNP  Probiotic Product ([place]) CAPS Take 1 capsule by mouth daily.   Yes Historical Provider, MD  simvastatin (ZOCOR) 10 MG tablet TAKE 1 TABLET EVERY  DAY 10/23/15  Yes [name], FNP  SYMBICORT 160-4.5 MCG/ACT inhaler INHALE 2 PUFFS TWICE DAILY 10/28/15  Yes [name], FNP  warfarin (COUMADIN) 2 MG tablet TAKE PER PHYSICIAN INSTRUCTIONS Patient taking differently: TAKE 2MG  ON MONDAYS AND THURSDAYS, THEN TAKE 1MG  ON ALL OTHER DAYS 11/12/15  Yes [name], FNP  albuterol (PROVENTIL HFA;VENTOLIN HFA) 108 (90 Base) MCG/ACT inhaler Inhale 2 puffs into the lungs every 6 (six) hours as needed for wheezing or shortness of breath. 11/26/15   Ezequiel Essex, MD  azithromycin (ZITHROMAX) 250 MG tablet Take 250-500 mg by mouth See admin instructions.  11/14/15   Historical Provider, MD  Blood Glucose Calibration (TRUE METRIX LEVEL 1) LOW SOLN Use weekly 10/23/14   [name], FNP  Blood Glucose Monitoring Suppl (TRUE METRIX AIR GLUCOSE METER) DEVI 1 Device by Does not apply route 2 (two) times daily. Use to Test blood sugar bid. DX E13.10 12/19/14   [name], FNP  glucose blood test strip True metrix test strips. Test bid. E13.10 12/19/14   [name], FNP  ipratropium-albuterol (DUONEB) 0.5-2.5 (3) MG/3ML SOLN Take 3 mLs by nebulization every 4 (four) hours as needed. 11/15/15   Eustaquio Maize, MD  levofloxacin (LEVAQUIN) 500 MG  tablet Take 500 mg by mouth daily.  11/10/15   Historical Provider, MD  predniSONE (DELTASONE) 50 MG tablet 1 tablet PO daily 11/26/15   Ezequiel Essex, MD  TRUEPLUS LANCETS 28G MISC Test Blood sugar bid. DX E13.10 12/19/14   [name], FNP   BP 116/59 mmHg  Pulse 79  Temp(Src) 98.8 F (37.1 C) (Oral)  Resp 16  Ht 5\' 2"  (1.575 m)  Wt 133 lb (60.328 kg)  BMI 24.32 kg/m2  SpO2 100% Physical Exam  Constitutional: She is oriented to person, place, and time. She appears well-developed and well-nourished. No distress.  HENT:  Head: Normocephalic and atraumatic.  Mouth/Throat: Oropharynx is clear and moist. No oropharyngeal exudate.  Eyes: Conjunctivae and EOM are normal. Pupils are equal, round, and reactive to light.  Neck: Normal range of motion. Neck supple.  No meningismus.  Cardiovascular: Normal rate, regular rhythm, normal heart sounds and intact distal pulses.   No murmur heard. Pulmonary/Chest: Effort normal. No respiratory distress. She has wheezes.  Coarse breath sounds with diffuse wheezing  Abdominal: Soft. There is tenderness in the left upper quadrant. There is no rebound and no guarding.  Genitourinary:  No gross blood on rectal exam; chaperone present  Musculoskeletal: Normal range of motion. She exhibits no edema or tenderness.  Neurological: She is alert and oriented to person, place, and time. No cranial nerve deficit. She exhibits normal muscle tone. Coordination normal.   5/5 strength throughout. CN 2-12 intact.Equal grip strength.   Skin: Skin is warm.  Psychiatric: She has a normal mood and affect. Her behavior is normal.  Nursing note and vitals reviewed.   ED Course  Procedures (including critical care time) DIAGNOSTIC STUDIES: Oxygen Saturation is 100% on RA, normal by my interpretation.    COORDINATION OF CARE: 8:44 PM-Discussed treatment plan which includes labs, breathing treatment and x-ray with pt at bedside and pt agreed to plan.    Labs  Review Labs Reviewed  BASIC METABOLIC PANEL - Abnormal; Notable for the following:    Potassium 3.4 (*)    Creatinine, Ser 1.68 (*)    Calcium 7.8 (*)    GFR calc non Af Amer 28 (*)    GFR calc Af Amer 33 (*)    All  other components within normal limits  CBC - Abnormal; Notable for the following:    RBC 3.55 (*)    Hemoglobin 11.3 (*)    HCT 34.3 (*)    RDW 15.7 (*)    All other components within normal limits  LIPASE, BLOOD - Abnormal; Notable for the following:    Lipase 67 (*)    All other components within normal limits  PROTIME-INR - Abnormal; Notable for the following:    Prothrombin Time 30.7 (*)    INR 3.01 (*)    All other components within normal limits  C DIFFICILE QUICK SCREEN W PCR REFLEX  TROPONIN I  URINALYSIS, ROUTINE W REFLEX MICROSCOPIC (NOT AT [facility])  TROPONIN I  POC OCCULT BLOOD, ED    Imaging Review Ct Abdomen Pelvis Wo Contrast  11/25/2015  CLINICAL DATA:  Diarrhea for 3 days.  Abdominal pain. EXAM: CT ABDOMEN AND PELVIS WITHOUT CONTRAST TECHNIQUE: Multidetector CT imaging of the abdomen and pelvis was performed following the standard protocol without IV contrast. COMPARISON:  CT 12/31/2014 FINDINGS: Lower chest: Multi chamber cardiomegaly. No pleural effusion. Pacemaker is partially included. Liver: No focal lesion allowing for lack contrast. Elongated left lobe. Hepatobiliary: Postcholecystectomy. Mild to moderate extra and central intrahepatic biliary ductal dilatation is unchanged from prior exam allowing for differences in technique. Pancreas: No ductal dilatation or inflammation. Parenchymal atrophy. Spleen: Normal. Adrenal glands: No nodule. Kidneys: Symmetric in size. No hydronephrosis. Mild nonspecific perinephric stranding bilaterally. The partially exophytic small hypodensities in the right kidney on prior exam are less well-defined currently given lack contrast. Stomach/Bowel: Stomach physiologically distended with ingested contents. There are no  dilated or thickened small bowel loops. Post ventral abdominal wall hernia repair with multiple tacks, unchanged appearance since prior exam. Distal colonic diverticulosis without diverticulitis. Vascular/Lymphatic: There are prominent central mesenteric lymph nodes with minimal haziness of the mesentery. Unchanged small retroperitoneal lymph nodes. Abdominal aorta is normal in caliber. Moderate to advanced atherosclerosis of the aorta and its branches without aneurysm. Reproductive: Uterus and ovaries are normal for age. Bladder: Physiologically distended.  No wall thickening. Other: No free air, free fluid, or intra-abdominal fluid collection. Musculoskeletal: Multilevel degenerative change throughout the lumbar spine, most prominent at L4-L5. IMPRESSION: 1. Mesenteric haziness with prominent central mesenteric lymph nodes, nonspecific but can be seen with mesenteric adenitis. 2. Additional stable chronic findings as described. Colonic diverticulosis without diverticulitis. Electronically Signed   By: Jeb Levering M.D.   On: 11/25/2015 21:35   Dg Chest 2 View  11/25/2015  CLINICAL DATA:  Cough and diarrhea EXAM: CHEST  2 VIEW COMPARISON:  November 09, 2015 FINDINGS: There is no edema or consolidation. The heart size and pulmonary vascularity are normal. No adenopathy. Pacemaker lead tip is attached to the right ventricle. There is postoperative change in the right shoulder. IMPRESSION: No edema or consolidation. Electronically Signed   By: Lowella Grip III M.D.   On: 11/25/2015 21:20   I have personally reviewed and evaluated these images and lab results as part of my medical decision-making.   EKG Interpretation   Date/Time:  Monday November 25 2015 20:54:57 EDT Ventricular Rate:  68 PR Interval:    QRS Duration: 130 QT Interval:  451 QTC Calculation: 480 R Axis:   -79 Text Interpretation:  Atrial fibrillation Nonspecific IVCD with LAD LVH  with secondary repolarization abnormality Anterior  infarct, old No  significant change was found Confirmed by Wyvonnia Dusky  MD, Wynonia Medero [phone]) on  11/25/2015 10:07:51 PM  MDM   Final diagnoses:  Diarrhea, unspecified type  COPD exacerbation (HCC)   1 week of generalized weakness and diarrhea.  Recent antibiotics use for COPD.  No vomiting, chest pain, SOB, fever.  States wheezing is at baseline. [name]  UA negative.  Abdomen soft. Mild LUQ tenderness. Wheezing on exam, nebs given CXR negative. CT shows nonspecific mesenteric lymph nodes.  TOlerating PO in the ED and ambulatory. Labs reassuring. No diarrhea in the ED. Abdomen soft.  Unable to give stool sample.  Troponin negative x2.  Additional nebs and steroids given.  Wheezing persists.  Was treated last month with steroids and doxycycline.  Suspect viral diarrheal illness. Anticipate home with supportive care.   Additional nebulizer for COPD exacerbation.  Dr. Tomi Bamberger to assume care.    I personally performed the services described in this documentation, which was scribed in my presence. The recorded information has been reviewed and is accurate.   Ezequiel Essex, MD 11/26/15 [phone]

## 2015-11-25 NOTE — ED Notes (Signed)
Pt ambulated to bathroom and back with the assistance of her cane. Pt still c/o weakness.

## 2015-11-25 NOTE — ED Notes (Addendum)
Patient complaining of diarrhea and weakness x 1 week. Denies vomiting. Also complaining of "burning in my stomach." Patient states she has been on antibiotics within the last 30 days.

## 2015-11-26 DIAGNOSIS — B351 Tinea unguium: Secondary | ICD-10-CM | POA: Diagnosis not present

## 2015-11-26 DIAGNOSIS — E1151 Type 2 diabetes mellitus with diabetic peripheral angiopathy without gangrene: Secondary | ICD-10-CM | POA: Diagnosis not present

## 2015-11-26 DIAGNOSIS — L84 Corns and callosities: Secondary | ICD-10-CM | POA: Diagnosis not present

## 2015-11-26 DIAGNOSIS — J441 Chronic obstructive pulmonary disease with (acute) exacerbation: Secondary | ICD-10-CM | POA: Diagnosis not present

## 2015-11-26 MED ORDER — IPRATROPIUM-ALBUTEROL 0.5-2.5 (3) MG/3ML IN SOLN
3.0000 mL | Freq: Once | RESPIRATORY_TRACT | Status: AC
  Administered 2015-11-26: 3 mL via RESPIRATORY_TRACT
  Filled 2015-11-26: qty 3

## 2015-11-26 MED ORDER — ALBUTEROL SULFATE HFA 108 (90 BASE) MCG/ACT IN AERS: 2.0000 | INHALATION_SPRAY | Freq: Four times a day (QID) | RESPIRATORY_TRACT | Status: DC | PRN

## 2015-11-26 MED ORDER — PREDNISONE 50 MG PO TABS: ORAL_TABLET | ORAL | Status: DC

## 2015-11-26 MED ORDER — PREDNISONE 50 MG PO TABS
60.0000 mg | ORAL_TABLET | Freq: Once | ORAL | Status: AC
  Administered 2015-11-26: 60 mg via ORAL
  Filled 2015-11-26: qty 1

## 2015-11-26 NOTE — ED Notes (Signed)
Patient walked to restroom with use of quad cane and standby assist.

## 2015-11-26 NOTE — ED Provider Notes (Signed)
Patient with left end of shift to see how she was doing after she finished her nebulizer treatments. Patient was ambulated by nursing staff and her pulse ox remained 99%. She states when she walks she did not feel any worse. She seems comfortable at rest with a pulse ox of 99% on room air. However when I listen to her she still has some scattered rhonchi diffusely especially at the bases. Patient has refused any more nebulizer treatments. She states she has nebulizer she can use at home. She states she feels much improved and wants to be discharged. Her daughter states she has a pulse ox monitor at home that she can keep a close eye on her. She also reports patient has had bronchitis twice in the past month.  Diagnoses that have been ruled out:  None  Diagnoses that are still under consideration:  None  Final diagnoses:  Diarrhea, unspecified type  COPD exacerbation (HCC)    New Prescriptions   ALBUTEROL (PROVENTIL HFA;VENTOLIN HFA) 108 (90 BASE) MCG/ACT INHALER    Inhale 2 puffs into the lungs every 6 (six) hours as needed for wheezing or shortness of breath.   PREDNISONE (DELTASONE) 50 MG TABLET    1 tablet PO daily    Plan discharge  Rolland Porter, MD, Barbette Or, MD 11/26/15 [phone]

## 2015-11-26 NOTE — Discharge Instructions (Signed)
Chronic Obstructive Pulmonary Disease Take the steroids as prescribed. Follow up with your doctor. Return to the ED if you develop new or worsening symptoms. Chronic obstructive pulmonary disease (COPD) is a common lung condition in which airflow from the lungs is limited. COPD is a general term that can be used to describe many different lung problems that limit airflow, including both chronic bronchitis and emphysema. If you have COPD, your lung function will probably never return to normal, but there are measures you can take to improve lung function and make yourself feel better. CAUSES   Smoking (common).  Exposure to secondhand smoke.  Genetic problems.  Chronic inflammatory lung diseases or recurrent infections. SYMPTOMS  Shortness of breath, especially with physical activity.  Deep, persistent (chronic) cough with a large amount of thick mucus.  Wheezing.  Rapid breaths (tachypnea).  Gray or bluish discoloration (cyanosis) of the skin, especially in your fingers, toes, or lips.  Fatigue.  Weight loss.  Frequent infections or episodes when breathing symptoms become much worse (exacerbations).  Chest tightness. DIAGNOSIS Your health care provider will take a medical history and perform a physical examination to diagnose COPD. Additional tests for COPD may include:  Lung (pulmonary) function tests.  Chest X-ray.  CT scan.  Blood tests. TREATMENT  Treatment for COPD may include:  Inhaler and nebulizer medicines. These help manage the symptoms of COPD and make your breathing more comfortable.  Supplemental oxygen. Supplemental oxygen is only helpful if you have a low oxygen level in your blood.  Exercise and physical activity. These are beneficial for nearly all people with COPD.  Lung surgery or transplant.  Nutrition therapy to gain weight, if you are underweight.  Pulmonary rehabilitation. This may involve working with a team of health care providers and  specialists, such as respiratory, occupational, and physical therapists. HOME CARE INSTRUCTIONS  Take all medicines (inhaled or pills) as directed by your health care provider.  Avoid over-the-counter medicines or cough syrups that dry up your airway (such as antihistamines) and slow down the elimination of secretions unless instructed otherwise by your health care provider.  If you are a smoker, the most important thing that you can do is stop smoking. Continuing to smoke will cause further lung damage and breathing trouble. Ask your health care provider for help with quitting smoking. He or she can direct you to community resources or hospitals that provide support.  Avoid exposure to irritants such as smoke, chemicals, and fumes that aggravate your breathing.  Use oxygen therapy and pulmonary rehabilitation if directed by your health care provider. If you require home oxygen therapy, ask your health care provider whether you should purchase a pulse oximeter to measure your oxygen level at home.  Avoid contact with individuals who have a contagious illness.  Avoid extreme temperature and humidity changes.  Eat healthy foods. Eating smaller, more frequent meals and resting before meals may help you maintain your strength.  Stay active, but balance activity with periods of rest. Exercise and physical activity will help you maintain your ability to do things you want to do.  Preventing infection and hospitalization is very important when you have COPD. Make sure to receive all the vaccines your health care provider recommends, especially the pneumococcal and influenza vaccines. Ask your health care provider whether you need a pneumonia vaccine.  Learn and use relaxation techniques to manage stress.  Learn and use controlled breathing techniques as directed by your health care provider. Controlled breathing techniques include:  Pursed  lip breathing. Start by breathing in (inhaling) through  your nose for 1 second. Then, purse your lips as if you were going to whistle and breathe out (exhale) through the pursed lips for 2 seconds.  Diaphragmatic breathing. Start by putting one hand on your abdomen just above your waist. Inhale slowly through your nose. The hand on your abdomen should move out. Then purse your lips and exhale slowly. You should be able to feel the hand on your abdomen moving in as you exhale.  Learn and use controlled coughing to clear mucus from your lungs. Controlled coughing is a series of short, progressive coughs. The steps of controlled coughing are: 1. Lean your head slightly forward. 2. Breathe in deeply using diaphragmatic breathing. 3. Try to hold your breath for 3 seconds. 4. Keep your mouth slightly open while coughing twice. 5. Spit any mucus out into a tissue. 6. Rest and repeat the steps once or twice as needed. SEEK MEDICAL CARE IF:  You are coughing up more mucus than usual.  There is a change in the color or thickness of your mucus.  Your breathing is more labored than usual.  Your breathing is faster than usual. SEEK IMMEDIATE MEDICAL CARE IF:  You have shortness of breath while you are resting.  You have shortness of breath that prevents you from:  Being able to talk.  Performing your usual physical activities.  You have chest pain lasting longer than 5 minutes.  Your skin color is more cyanotic than usual.  You measure low oxygen saturations for longer than 5 minutes with a pulse oximeter. MAKE SURE YOU:  Understand these instructions.  Will watch your condition.  Will get help right away if you are not doing well or get worse.   This information is not intended to replace advice given to you by your health care provider. Make sure you discuss any questions you have with your health care provider.   Document Released: 05/06/2005 Document Revised: 08/17/2014 Document Reviewed: 03/23/2013 Elsevier Interactive Patient  Education [employer].

## 2015-11-26 NOTE — ED Notes (Signed)
Pulse oximetry while ambulating remained 100% on room air.

## 2015-12-05 ENCOUNTER — Ambulatory Visit (INDEPENDENT_AMBULATORY_CARE_PROVIDER_SITE_OTHER): Payer: Commercial Managed Care - HMO | Admitting: Pharmacist

## 2015-12-05 ENCOUNTER — Ambulatory Visit: Payer: Commercial Managed Care - HMO

## 2015-12-05 DIAGNOSIS — I482 Chronic atrial fibrillation: Secondary | ICD-10-CM

## 2015-12-05 DIAGNOSIS — I4821 Permanent atrial fibrillation: Secondary | ICD-10-CM

## 2015-12-05 DIAGNOSIS — M810 Age-related osteoporosis without current pathological fracture: Secondary | ICD-10-CM

## 2015-12-05 LAB — COAGUCHEK XS/INR WAIVED
INR: 5.1 — AB (ref 0.9–1.1)
Prothrombin Time: 61.4 s

## 2015-12-05 MED ORDER — CALCIUM CITRATE-VITAMIN D 250-100 MG-UNIT PO TABS: 2.0000 | ORAL_TABLET | Freq: Every day | ORAL | Status: DC

## 2015-12-05 NOTE — Progress Notes (Signed)
Patient ID: [name], female   DOB: 25-Aug-1934, 81 y.o.   MRN: [id number]    HPI: Osteoporosis:  Currently injects Prolia 12m SQ every 6 months for osteoporosis treatment.  She has tolerated this treatment well.   Anticoagulation: on chronic warfarin therapy for atrial fibrillation.  Patient has recently has ABX - levofloxaxin and prednisone for URI.  She has bruising on right knee which she injured in recent fall ([place])                                                             PMH: Age at menopause:  89'sHysterectomy?  No Oophorectomy?  No HRT? No Steroid Use?  Yes - Current.  Type/duration: inhaled steroids Thyroid med?  No History of cancer?  No History of digestive disorders (ie Crohn's)?  Yes - GERD and takes pancreatic enzymes Current or previous eating disorders?  No Last Vitamin D Result:  56 (12/2011) Last GFR Result:  32 (11/01/2015)   FH/SH: Family history of osteoporosis?  No Parent with history of hip fracture?  No Family history of breast cancer?  No Exercise?  No Smoking?  Yes - trying to quite Alcohol?  No    Calcium Assessment Calcium Intake  # of servings/day  Calcium mg  Milk (8 oz) 0  x  300  = 0  Yogurt (4 oz) 1 x  200 = 2067m Cheese (1 oz) 0 x  200 = 0  Other Calcium sources   25061mCa supplement 0 = 0   Estimated calcium intake per day 450m68m DEXA Results Date of Test T-Score for AP Spine L1-L4 L2 T - Score T-Score for Total Left Hip T-Score for Total Right Hip  12/05/2015 -1.2 -3.0 -1.9 -2.1  11/01/2013 -1.4 -3.4 -1.9 -2.2  11/29/2006 -1.2  -1.1 -1.2                Estimated serum creatinin = 35.30 ml/min  FRAX 10 year estimate: (based on T-Score of -2.2) Total FX risk:  17.5%  (consider medication if >/= 20%) Hip FX risk:  7.8%  (consider medication if >/= 3%)  Assessment: Osteoporosis based on Tscore for L-2 - improving T-Score since staring Prolia Supratherpeutic  anticoagulation - due to prednisone   Recommendations: 1.  Continue prolia 60mg9mq 6 months.  2.  recommend calcium 1200mg 45my through supplementation or diet.  Recommend use calcium citrate for supplementation 3.  recommend weight bearing exercise - 30 minutes at least 4 days per week.   4.  Counseled and educated about fall risk and prevention. 5.  No nwarfarin for 2 days, then decrease dose per below  Anticoagulation Dose Instructions as of 12/05/2015      Sun MoDorene Grebeed Thu Fri Sat   New Dose 1 mg 1 mg 1 mg 1 mg 2 mg 1 mg 1 mg       Orders Placed This Encounter  Procedures  . DG Bone Density    Order Specific Question:  Reason for Exam (SYMPTOM  OR DIAGNOSIS REQUIRED)    Answer:  osteoporosis    Order Specific Question:  Preferred imaging location?    Answer:  Internal  . BMP8+EGFR  Recheck DEXA:  2 years  RTC in 5 - 7 days to recheck INR  Time spent counseling patient:  30 minutes   Cherre Robins, PharmD, CPP

## 2015-12-05 NOTE — Patient Instructions (Signed)
Anticoagulation Dose Instructions as of 12/05/2015      [name] Tue Wed Thu Fri Sat   New Dose 1 mg 1 mg 1 mg 1 mg 2 mg 1 mg 1 mg    Description        No warfarin today - April 27th or tomorrow - April 28th.  Then decrease dose to warfarin 2mg  - take 1/2 tablet daily except on Thursdays take 1 tablet.      INR was 5.1 today

## 2015-12-06 LAB — BMP8+EGFR
BUN/Creatinine Ratio: 15 (ref 12–28)
BUN: 21 mg/dL (ref 8–27)
CO2: 21 mmol/L (ref 18–29)
Calcium: 8.4 mg/dL — ABNORMAL LOW (ref 8.7–10.3)
Chloride: 93 mmol/L — ABNORMAL LOW (ref 96–106)
Creatinine, Ser: 1.36 mg/dL — ABNORMAL HIGH (ref 0.57–1.00)
GFR calc Af Amer: 43 mL/min/{1.73_m2} — ABNORMAL LOW (ref >59)
GFR calc non Af Amer: 37 mL/min/{1.73_m2} — ABNORMAL LOW (ref >59)
GLUCOSE: 170 mg/dL — AB (ref 65–99)
POTASSIUM: 3.8 mmol/L (ref 3.5–5.2)
SODIUM: 134 mmol/L (ref 134–144)

## 2015-12-08 DIAGNOSIS — J449 Chronic obstructive pulmonary disease, unspecified: Secondary | ICD-10-CM | POA: Diagnosis not present

## 2015-12-08 DIAGNOSIS — J188 Other pneumonia, unspecified organism: Secondary | ICD-10-CM | POA: Diagnosis not present

## 2015-12-12 ENCOUNTER — Ambulatory Visit (INDEPENDENT_AMBULATORY_CARE_PROVIDER_SITE_OTHER): Payer: Commercial Managed Care - HMO | Admitting: Pharmacist

## 2015-12-12 DIAGNOSIS — I482 Chronic atrial fibrillation: Secondary | ICD-10-CM

## 2015-12-12 DIAGNOSIS — I4821 Permanent atrial fibrillation: Secondary | ICD-10-CM

## 2015-12-12 LAB — COAGUCHEK XS/INR WAIVED
INR: 2.4 — ABNORMAL HIGH (ref 0.9–1.1)
Prothrombin Time: 28.2 s

## 2015-12-12 NOTE — Patient Instructions (Signed)
Anticoagulation Dose Instructions as of 12/12/2015      [name] Tue Wed Thu Fri Sat   New Dose 1 mg 1 mg 1 mg 1 mg 2 mg 1 mg 1 mg    Description        Continue current dose of warfarin 2mg  - take 1/2 tablet daily except on Thursdays take 1 tablet.      INR was 2.4 today

## 2015-12-20 IMAGING — CR DG CHEST 2V
2 series · 2 of 2 positions shown · non-contrast
Comparison: September 27, 2014

CLINICAL DATA: Cardiac arrhythmia. History of coronary artery
disease

EXAM:
CHEST  2 VIEW

[view not recorded (1 of 2)]
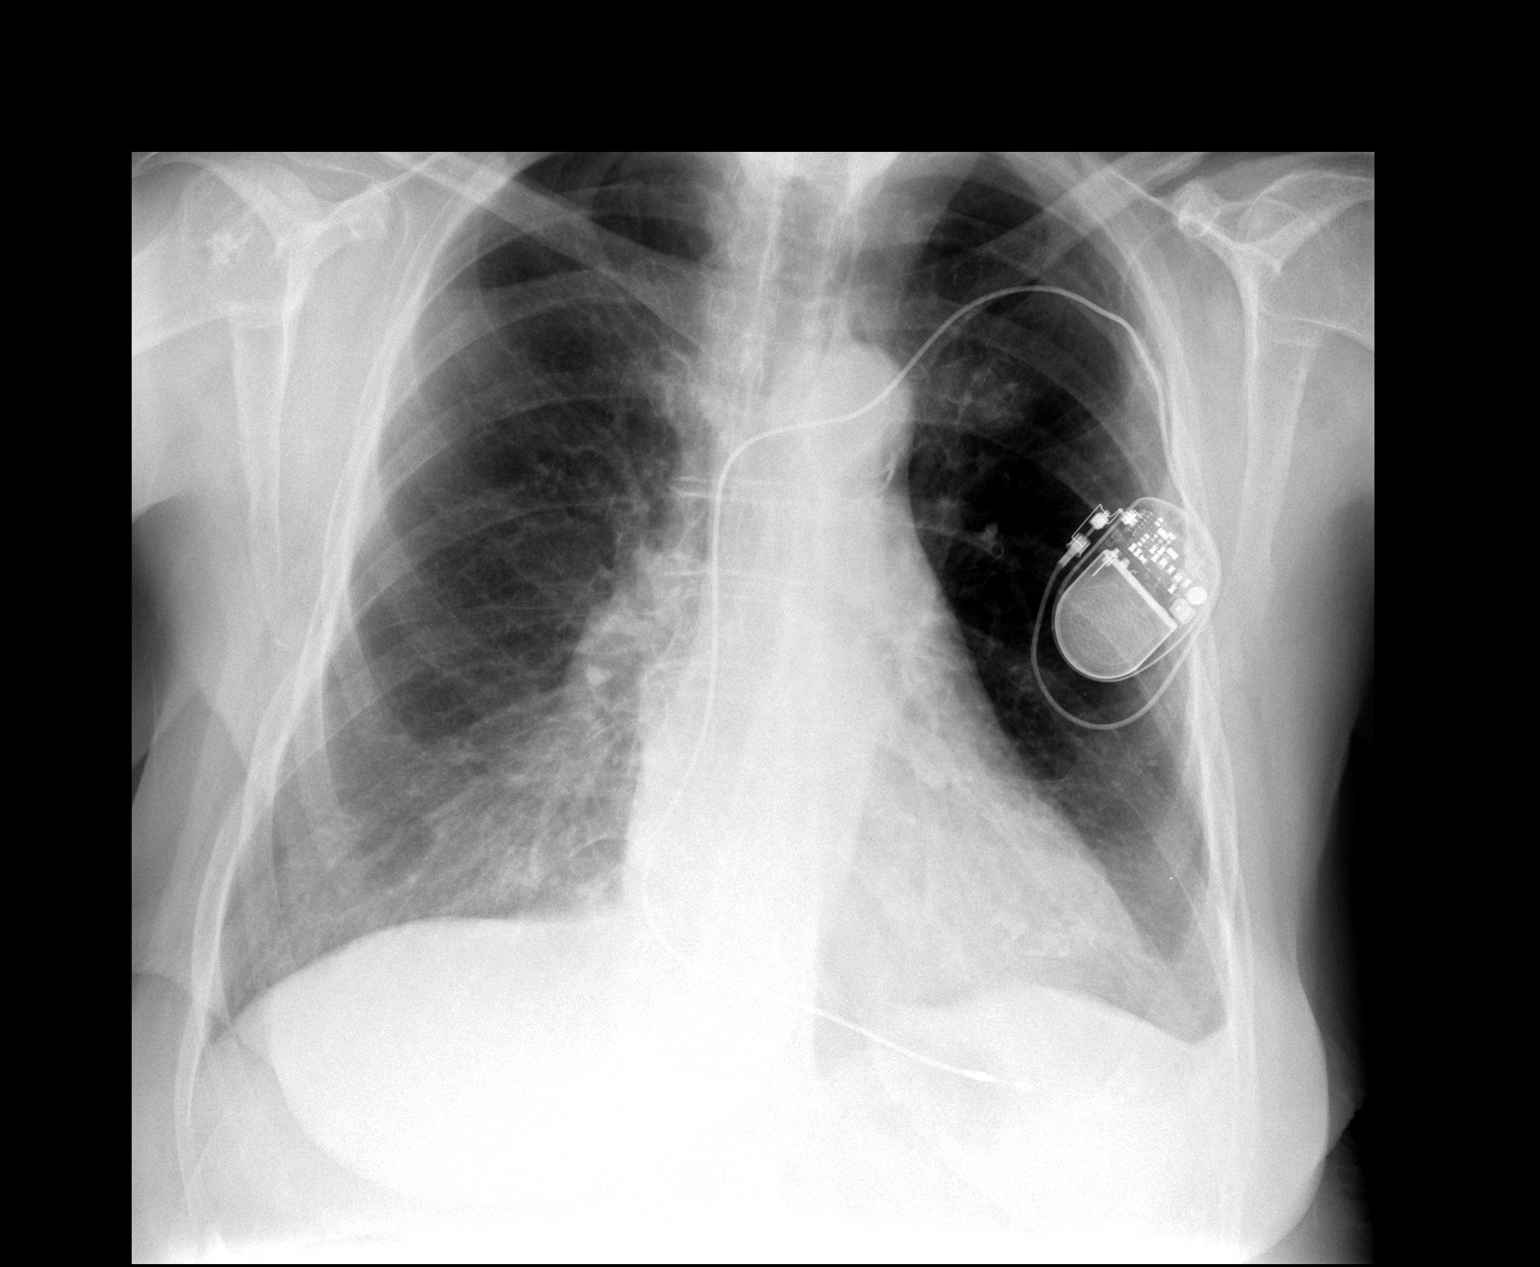

[view not recorded (2 of 2)]
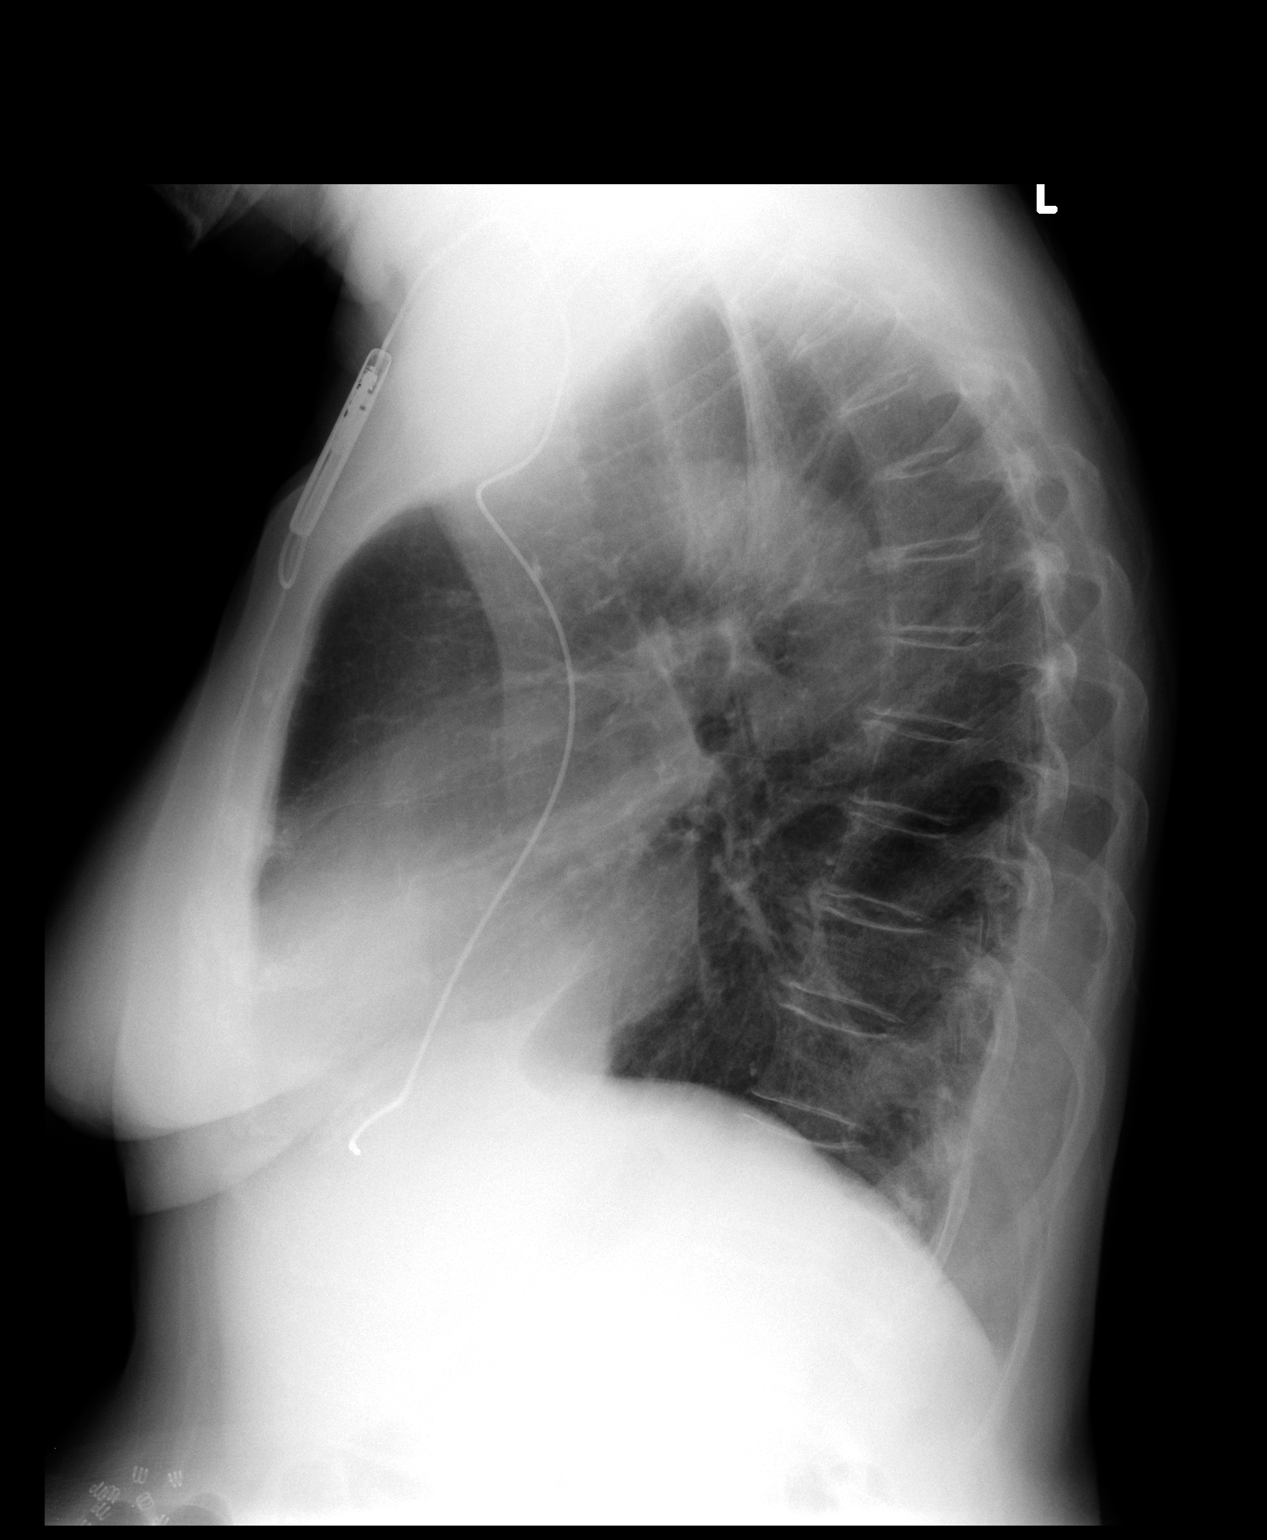

[2 of 2 positions shown; findings below may reference images not displayed]

FINDINGS: There is mild scarring in the lateral left base. There is no edema
or consolidation. Heart size and pulmonary vascularity are within
normal limits. There is sclerosis along the anterior left first rib,
stable. Pacemaker lead tip is in the region of the middle cardiac
vein. No pneumothorax. No adenopathy. Focal area of sclerosis in the
proximal right humerus is stable.
IMPRESSION: No edema or consolidation.  Slight scarring lateral left base.

## 2015-12-21 DIAGNOSIS — E119 Type 2 diabetes mellitus without complications: Secondary | ICD-10-CM | POA: Diagnosis not present

## 2015-12-21 DIAGNOSIS — J449 Chronic obstructive pulmonary disease, unspecified: Secondary | ICD-10-CM | POA: Diagnosis not present

## 2015-12-21 DIAGNOSIS — J189 Pneumonia, unspecified organism: Secondary | ICD-10-CM | POA: Diagnosis not present

## 2015-12-21 DIAGNOSIS — R079 Chest pain, unspecified: Secondary | ICD-10-CM | POA: Diagnosis not present

## 2015-12-21 DIAGNOSIS — J181 Lobar pneumonia, unspecified organism: Secondary | ICD-10-CM | POA: Diagnosis not present

## 2015-12-21 DIAGNOSIS — I509 Heart failure, unspecified: Secondary | ICD-10-CM | POA: Diagnosis not present

## 2015-12-21 DIAGNOSIS — I110 Hypertensive heart disease with heart failure: Secondary | ICD-10-CM | POA: Diagnosis not present

## 2015-12-23 ENCOUNTER — Encounter: Payer: Self-pay | Admitting: Physician Assistant

## 2015-12-23 ENCOUNTER — Ambulatory Visit (INDEPENDENT_AMBULATORY_CARE_PROVIDER_SITE_OTHER): Payer: Commercial Managed Care - HMO | Admitting: Physician Assistant

## 2015-12-23 ENCOUNTER — Other Ambulatory Visit: Payer: Self-pay | Admitting: *Deleted

## 2015-12-23 VITALS — BP 109/61 | HR 63 | Temp 98.1°F | Ht 62.0 in | Wt 134.8 lb

## 2015-12-23 DIAGNOSIS — M810 Age-related osteoporosis without current pathological fracture: Secondary | ICD-10-CM

## 2015-12-23 DIAGNOSIS — M542 Cervicalgia: Secondary | ICD-10-CM

## 2015-12-23 NOTE — Patient Instructions (Signed)
Cervical Sprain  A cervical sprain is an injury in the neck in which the strong, fibrous tissues (ligaments) that connect your neck bones stretch or tear. Cervical sprains can range from mild to severe. Severe cervical sprains can cause the neck vertebrae to be unstable. This can lead to damage of the spinal cord and can result in serious nervous system problems. The amount of time it takes for a cervical sprain to get better depends on the cause and extent of the injury. Most cervical sprains heal in 1 to 3 weeks.  CAUSES   Severe cervical sprains may be caused by:    Contact sport injuries (such as from football, rugby, wrestling, hockey, auto racing, gymnastics, diving, martial arts, or boxing).    Motor vehicle collisions.    Whiplash injuries. This is an injury from a sudden forward and backward whipping movement of the head and neck.   Falls.   Mild cervical sprains may be caused by:    Being in an awkward position, such as while cradling a telephone between your ear and shoulder.    Sitting in a chair that does not offer proper support.    Working at a poorly designed computer station.    Looking up or down for long periods of time.   SYMPTOMS    Pain, soreness, stiffness, or a burning sensation in the front, back, or sides of the neck. This discomfort may develop immediately after the injury or slowly, 24 hours or more after the injury.    Pain or tenderness directly in the middle of the back of the neck.    Shoulder or upper back pain.    Limited ability to move the neck.    Headache.    Dizziness.    Weakness, numbness, or tingling in the hands or arms.    Muscle spasms.    Difficulty swallowing or chewing.    Tenderness and swelling of the neck.   DIAGNOSIS   Most of the time your health care provider can diagnose a cervical sprain by taking your history and doing a physical exam. Your health care provider will ask about previous neck injuries and any known neck  problems, such as arthritis in the neck. X-rays may be taken to find out if there are any other problems, such as with the bones of the neck. Other tests, such as a CT scan or MRI, may also be needed.   TREATMENT   Treatment depends on the severity of the cervical sprain. Mild sprains can be treated with rest, keeping the neck in place (immobilization), and pain medicines. Severe cervical sprains are immediately immobilized. Further treatment is done to help with pain, muscle spasms, and other symptoms and may include:   Medicines, such as pain relievers, numbing medicines, or muscle relaxants.    Physical therapy. This may involve stretching exercises, strengthening exercises, and posture training. Exercises and improved posture can help stabilize the neck, strengthen muscles, and help stop symptoms from returning.   HOME CARE INSTRUCTIONS    Put ice on the injured area.     Put ice in a plastic bag.     Place a towel between your skin and the bag.     Leave the ice on for 15-20 minutes, 3-4 times a day.    If your injury was severe, you may have been given a cervical collar to wear. A cervical collar is a two-piece collar designed to keep your neck from moving while it heals.      Do not remove the collar unless instructed by your health care provider.    If you have long hair, keep it outside of the collar.    Ask your health care provider before making any adjustments to your collar. Minor adjustments may be required over time to improve comfort and reduce pressure on your chin or on the back of your head.    Ifyou are allowed to remove the collar for cleaning or bathing, follow your health care provider's instructions on how to do so safely.    Keep your collar clean by wiping it with mild soap and water and drying it completely. If the collar you have been given includes removable pads, remove them every 1-2 days and hand wash them with soap and water. Allow them to air dry. They should be completely  dry before you wear them in the collar.    If you are allowed to remove the collar for cleaning and bathing, wash and dry the skin of your neck. Check your skin for irritation or sores. If you see any, tell your health care provider.    Do not drive while wearing the collar.    Only take over-the-counter or prescription medicines for pain, discomfort, or fever as directed by your health care provider.    Keep all follow-up appointments as directed by your health care provider.    Keep all physical therapy appointments as directed by your health care provider.    Make any needed adjustments to your workstation to promote good posture.    Avoid positions and activities that make your symptoms worse.    Warm up and stretch before being active to help prevent problems.   SEEK MEDICAL CARE IF:    Your pain is not controlled with medicine.    You are unable to decrease your pain medicine over time as planned.    Your activity level is not improving as expected.   SEEK IMMEDIATE MEDICAL CARE IF:    You develop any bleeding.   You develop stomach upset.   You have signs of an allergic reaction to your medicine.    Your symptoms get worse.    You develop new, unexplained symptoms.    You have numbness, tingling, weakness, or paralysis in any part of your body.   MAKE SURE YOU:    Understand these instructions.   Will watch your condition.   Will get help right away if you are not doing well or get worse.     This information is not intended to replace advice given to you by your health care provider. Make sure you discuss any questions you have with your health care provider.     Document Released: 05/24/2007 Document Revised: 08/01/2013 Document Reviewed: 02/01/2013  Elsevier Interactive Patient Education 2016 Elsevier Inc.

## 2015-12-23 NOTE — Progress Notes (Signed)
Subjective:     Patient ID: [name], female   DOB: [birth year], 81 y.o.   MRN: [id number]  HPI Pt with R sided neck pain since this am She denies any injury Has tried OTC topical creams w/o relief She has chronic back pain and is under the care of Dr Francesco Runner  Review of Systems + pain to the R lateral neck area No radiation of sx to the shoulder or arm Denies ay injury    Objective:   Physical Exam NAD + TTP along the R superior trap to the posterior cerv region No palp spasm Decrease in C-spine ROM due to sx Shoulder shrug equal bilat Strength equal in the upper ext Pulses/sensory good in upper ext    Assessment:     1. Neck pain        Plan:     Unable to do NSAIDS secondary to her chronic kidney dz Pt to continue with heat/ice Gentle stretching She is to f/u with her pain MD F/U here prn

## 2015-12-24 ENCOUNTER — Encounter: Payer: Self-pay | Admitting: Cardiology

## 2015-12-24 LAB — CUP PACEART REMOTE DEVICE CHECK
Battery Remaining Longevity: 145 mo
Battery Remaining Percentage: 95.5 %
Battery Voltage: 2.96 V
Implantable Lead Implant Date: 20120323
Implantable Lead Location: 753860
Lead Channel Setting Pacing Amplitude: 2.5 V
Lead Channel Setting Pacing Pulse Width: 0.5 ms
Lead Channel Setting Sensing Sensitivity: 2 mV
MDC IDC LEAD MODEL: 1948
MDC IDC MSMT LEADCHNL RV IMPEDANCE VALUE: 890 Ohm
MDC IDC MSMT LEADCHNL RV SENSING INTR AMPL: 12 mV
MDC IDC PG SERIAL: 7199163
MDC IDC SESS DTM: 20170410171553
MDC IDC STAT BRADY RV PERCENT PACED: 9.5 %
Pulse Gen Model: 1110

## 2015-12-27 ENCOUNTER — Other Ambulatory Visit: Payer: Self-pay

## 2015-12-27 ENCOUNTER — Telehealth: Payer: Self-pay | Admitting: Nurse Practitioner

## 2015-12-27 ENCOUNTER — Other Ambulatory Visit: Payer: Self-pay | Admitting: Nurse Practitioner

## 2015-12-27 DIAGNOSIS — F419 Anxiety disorder, unspecified: Secondary | ICD-10-CM

## 2015-12-27 MED ORDER — CLONAZEPAM 0.5 MG PO TABS: 0.5000 mg | ORAL_TABLET | Freq: Three times a day (TID) | ORAL | Status: DC | PRN

## 2015-12-27 NOTE — Telephone Encounter (Signed)
Last seen 12/23/15  [name]   MMM PCP  If approved route to nurse to call into [facility]

## 2015-12-27 NOTE — Telephone Encounter (Signed)
Please call in klonopin with 1 refills 

## 2015-12-30 ENCOUNTER — Encounter: Payer: Self-pay | Admitting: Pharmacist

## 2015-12-30 NOTE — Telephone Encounter (Signed)
Last seen 12/23/15  WLW   MMM PCP   If approved route to nurse to call into [facility]

## 2015-12-31 ENCOUNTER — Encounter: Payer: Self-pay | Admitting: Nurse Practitioner

## 2015-12-31 ENCOUNTER — Other Ambulatory Visit: Payer: Self-pay | Admitting: Pediatrics

## 2015-12-31 ENCOUNTER — Other Ambulatory Visit: Payer: Self-pay | Admitting: Nurse Practitioner

## 2016-01-05 DIAGNOSIS — K219 Gastro-esophageal reflux disease without esophagitis: Secondary | ICD-10-CM | POA: Diagnosis not present

## 2016-01-05 DIAGNOSIS — E119 Type 2 diabetes mellitus without complications: Secondary | ICD-10-CM | POA: Diagnosis not present

## 2016-01-05 DIAGNOSIS — R0602 Shortness of breath: Secondary | ICD-10-CM | POA: Diagnosis not present

## 2016-01-05 DIAGNOSIS — M1990 Unspecified osteoarthritis, unspecified site: Secondary | ICD-10-CM | POA: Diagnosis not present

## 2016-01-05 DIAGNOSIS — J449 Chronic obstructive pulmonary disease, unspecified: Secondary | ICD-10-CM | POA: Diagnosis not present

## 2016-01-05 DIAGNOSIS — Z79899 Other long term (current) drug therapy: Secondary | ICD-10-CM | POA: Diagnosis not present

## 2016-01-05 DIAGNOSIS — I509 Heart failure, unspecified: Secondary | ICD-10-CM | POA: Diagnosis not present

## 2016-01-05 DIAGNOSIS — I4891 Unspecified atrial fibrillation: Secondary | ICD-10-CM | POA: Diagnosis not present

## 2016-01-05 DIAGNOSIS — M25512 Pain in left shoulder: Secondary | ICD-10-CM | POA: Diagnosis not present

## 2016-01-05 DIAGNOSIS — E7800 Pure hypercholesterolemia, unspecified: Secondary | ICD-10-CM | POA: Diagnosis not present

## 2016-01-05 DIAGNOSIS — R079 Chest pain, unspecified: Secondary | ICD-10-CM | POA: Diagnosis not present

## 2016-01-07 ENCOUNTER — Encounter: Payer: Self-pay | Admitting: Cardiology

## 2016-01-07 DIAGNOSIS — J449 Chronic obstructive pulmonary disease, unspecified: Secondary | ICD-10-CM | POA: Diagnosis not present

## 2016-01-07 DIAGNOSIS — J188 Other pneumonia, unspecified organism: Secondary | ICD-10-CM | POA: Diagnosis not present

## 2016-01-08 ENCOUNTER — Telehealth: Payer: Self-pay | Admitting: Nurse Practitioner

## 2016-01-08 NOTE — Telephone Encounter (Signed)
Patient scheduled to see provider tomorrow for a referral to orthopedics concerning shoulder issues.  Records from [facility] were requested.

## 2016-01-09 ENCOUNTER — Ambulatory Visit: Payer: Self-pay | Admitting: Nurse Practitioner

## 2016-01-09 ENCOUNTER — Other Ambulatory Visit: Payer: Self-pay | Admitting: Pharmacist

## 2016-01-09 MED ORDER — DENOSUMAB 60 MG/ML ~~LOC~~ SOLN: SUBCUTANEOUS | Status: DC

## 2016-01-14 ENCOUNTER — Telehealth: Payer: Self-pay | Admitting: Nurse Practitioner

## 2016-01-14 DIAGNOSIS — S2232XA Fracture of one rib, left side, initial encounter for closed fracture: Secondary | ICD-10-CM | POA: Diagnosis not present

## 2016-01-14 DIAGNOSIS — S81012A Laceration without foreign body, left knee, initial encounter: Secondary | ICD-10-CM | POA: Diagnosis not present

## 2016-01-14 DIAGNOSIS — S81011A Laceration without foreign body, right knee, initial encounter: Secondary | ICD-10-CM | POA: Diagnosis not present

## 2016-01-14 DIAGNOSIS — R0781 Pleurodynia: Secondary | ICD-10-CM | POA: Diagnosis not present

## 2016-01-14 DIAGNOSIS — S098XXA Other specified injuries of head, initial encounter: Secondary | ICD-10-CM | POA: Diagnosis not present

## 2016-01-14 DIAGNOSIS — S299XXA Unspecified injury of thorax, initial encounter: Secondary | ICD-10-CM | POA: Diagnosis not present

## 2016-01-14 DIAGNOSIS — K219 Gastro-esophageal reflux disease without esophagitis: Secondary | ICD-10-CM | POA: Diagnosis not present

## 2016-01-14 DIAGNOSIS — W1839XA Other fall on same level, initial encounter: Secondary | ICD-10-CM | POA: Diagnosis not present

## 2016-01-14 DIAGNOSIS — J449 Chronic obstructive pulmonary disease, unspecified: Secondary | ICD-10-CM | POA: Diagnosis not present

## 2016-01-14 DIAGNOSIS — S80212A Abrasion, left knee, initial encounter: Secondary | ICD-10-CM | POA: Diagnosis not present

## 2016-01-14 DIAGNOSIS — E119 Type 2 diabetes mellitus without complications: Secondary | ICD-10-CM | POA: Diagnosis not present

## 2016-01-14 DIAGNOSIS — M25562 Pain in left knee: Secondary | ICD-10-CM | POA: Diagnosis not present

## 2016-01-14 DIAGNOSIS — S80211A Abrasion, right knee, initial encounter: Secondary | ICD-10-CM | POA: Diagnosis not present

## 2016-01-14 DIAGNOSIS — M25561 Pain in right knee: Secondary | ICD-10-CM | POA: Diagnosis not present

## 2016-01-14 DIAGNOSIS — S0121XA Laceration without foreign body of nose, initial encounter: Secondary | ICD-10-CM | POA: Diagnosis not present

## 2016-01-14 DIAGNOSIS — S0120XA Unspecified open wound of nose, initial encounter: Secondary | ICD-10-CM | POA: Diagnosis not present

## 2016-01-14 DIAGNOSIS — I509 Heart failure, unspecified: Secondary | ICD-10-CM | POA: Diagnosis not present

## 2016-01-14 DIAGNOSIS — E7800 Pure hypercholesterolemia, unspecified: Secondary | ICD-10-CM | POA: Diagnosis not present

## 2016-01-15 NOTE — Telephone Encounter (Signed)
[facility] Mail order.  Delivery information provided and they will send to office.  Due 02/13/2016 - they will send around 01/28/2016.

## 2016-01-16 ENCOUNTER — Ambulatory Visit (INDEPENDENT_AMBULATORY_CARE_PROVIDER_SITE_OTHER): Payer: Commercial Managed Care - HMO | Admitting: Nurse Practitioner

## 2016-01-16 ENCOUNTER — Encounter: Payer: Self-pay | Admitting: Nurse Practitioner

## 2016-01-16 VITALS — BP 98/58 | HR 61 | Temp 97.3°F | Ht 62.0 in | Wt 132.0 lb

## 2016-01-16 DIAGNOSIS — S0083XD Contusion of other part of head, subsequent encounter: Secondary | ICD-10-CM | POA: Diagnosis not present

## 2016-01-16 DIAGNOSIS — I482 Chronic atrial fibrillation: Secondary | ICD-10-CM

## 2016-01-16 DIAGNOSIS — G8929 Other chronic pain: Secondary | ICD-10-CM

## 2016-01-16 DIAGNOSIS — W19XXXD Unspecified fall, subsequent encounter: Secondary | ICD-10-CM

## 2016-01-16 DIAGNOSIS — S0180XD Unspecified open wound of other part of head, subsequent encounter: Secondary | ICD-10-CM | POA: Diagnosis not present

## 2016-01-16 DIAGNOSIS — I4821 Permanent atrial fibrillation: Secondary | ICD-10-CM

## 2016-01-16 DIAGNOSIS — Z09 Encounter for follow-up examination after completed treatment for conditions other than malignant neoplasm: Secondary | ICD-10-CM

## 2016-01-16 LAB — COAGUCHEK XS/INR WAIVED
INR: 2.8 — AB (ref 0.9–1.1)
PROTHROMBIN TIME: 34.1 s

## 2016-01-16 NOTE — Progress Notes (Addendum)
   Subjective:    Patient ID: [name], female    DOB: 07/18/1935, 81 y.o.   MRN: [id number]  HPI Patient comes in today for hospital follow up. She went to [facility] after a fall while she was getting dressed. They diagnosed her with left 5th rib fracture and multiple facial contusions. SAys that she is sore but otherwise doing okay. Denies headache or blurred vision.  Needs INR checked today  Review of Systems  Constitutional: Negative.   HENT: Negative.   Respiratory: Negative.   Cardiovascular: Negative.   Genitourinary: Negative.   Neurological: Negative.   Psychiatric/Behavioral: Negative.   All other systems reviewed and are negative.      Objective:   Physical Exam  Constitutional: She is oriented to person, place, and time. She appears well-developed and well-nourished. No distress.  Cardiovascular: Normal rate, regular rhythm and normal heart sounds.   Pulmonary/Chest: Effort normal. She has wheezes (inspiratory bil throughout).  Abdominal: Soft. Bowel sounds are normal.  Neurological: She is alert and oriented to person, place, and time.  Skin: Skin is warm.  Multiple healing superficial facial wounds as well as multiple contusions.  Psychiatric: She has a normal mood and affect. Her behavior is normal. Judgment and thought content normal.    BP 98/58 mmHg  Pulse 61  Temp(Src) 97.3 F (36.3 C) (Oral)  Ht 5\' 2"  (1.575 m)  Wt 132 lb (59.875 kg)  BMI 24.14 kg/m2  See anticoag flow sheet     Assessment & Plan:  1. Permanent atrial fibrillation (HCC) Recheck INR in 1 month - CoaguChek XS/INR Waived  2. Hospital discharge follow-up Hospital records reviewed  3. Facial contusion, subsequent encounter Ice bid  4. Open facial wound, subsequent encounter Keep wounds cleanand dry Antibiotic ointment BID  5. Chronic pain Referral to Dr. Beryle Quant new office in [place]  6. Fall  Order for pt for strength training  [name],  FNP

## 2016-01-16 NOTE — Patient Instructions (Signed)
Fall Prevention in the Home  Falls can cause injuries and can affect people from all age groups. There are many simple things that you can do to make your home safe and to help prevent falls. WHAT CAN I DO ON THE OUTSIDE OF MY HOME?  Regularly repair the edges of walkways and driveways and fix any cracks.  Remove high doorway thresholds.  Trim any shrubbery on the main path into your home.  Use bright outdoor lighting.  Clear walkways of debris and clutter, including tools and rocks.  Regularly check that handrails are securely fastened and in good repair. Both sides of any steps should have handrails.  Install guardrails along the edges of any raised decks or porches.  Have leaves, snow, and ice cleared regularly.  Use sand or salt on walkways during winter months.  In the garage, clean up any spills right away, including grease or oil spills. WHAT CAN I DO IN THE BATHROOM?  Use night lights.  Install grab bars by the toilet and in the tub and shower. Do not use towel bars as grab bars.  Use non-skid mats or decals on the floor of the tub or shower.  If you need to sit down while you are in the shower, use a plastic, non-slip stool..  Keep the floor dry. Immediately clean up any water that spills on the floor.  Remove soap buildup in the tub or shower on a regular basis.  Attach bath mats securely with double-sided non-slip rug tape.  Remove throw rugs and other tripping hazards from the floor. WHAT CAN I DO IN THE BEDROOM?  Use night lights.  Make sure that a bedside light is easy to reach.  Do not use oversized bedding that drapes onto the floor.  Have a firm chair that has side arms to use for getting dressed.  Remove throw rugs and other tripping hazards from the floor. WHAT CAN I DO IN THE KITCHEN?   Clean up any spills right away.  Avoid walking on wet floors.  Place frequently used items in easy-to-reach places.  If you need to reach for something  above you, use a sturdy step stool that has a grab bar.  Keep electrical cables out of the way.  Do not use floor polish or wax that makes floors slippery. If you have to use wax, make sure that it is non-skid floor wax.  Remove throw rugs and other tripping hazards from the floor. WHAT CAN I DO IN THE STAIRWAYS?  Do not leave any items on the stairs.  Make sure that there are handrails on both sides of the stairs. Fix handrails that are broken or loose. Make sure that handrails are as long as the stairways.  Check any carpeting to make sure that it is firmly attached to the stairs. Fix any carpet that is loose or worn.  Avoid having throw rugs at the top or bottom of stairways, or secure the rugs with carpet tape to prevent them from moving.  Make sure that you have a light switch at the top of the stairs and the bottom of the stairs. If you do not have them, have them installed. WHAT ARE SOME OTHER FALL PREVENTION TIPS?  Wear closed-toe shoes that fit well and support your feet. Wear shoes that have rubber soles or low heels.  When you use a stepladder, make sure that it is completely opened and that the sides are firmly locked. Have someone hold the ladder while you   are using it. Do not climb a closed stepladder.  Add color or contrast paint or tape to grab bars and handrails in your home. Place contrasting color strips on the first and last steps.  Use mobility aids as needed, such as canes, walkers, scooters, and crutches.  Turn on lights if it is dark. Replace any light bulbs that burn out.  Set up furniture so that there are clear paths. Keep the furniture in the same spot.  Fix any uneven floor surfaces.  Choose a carpet design that does not hide the edge of steps of a stairway.  Be aware of any and all pets.  Review your medicines with your healthcare provider. Some medicines can cause dizziness or changes in blood pressure, which increase your risk of falling. Talk  with your health care provider about other ways that you can decrease your risk of falls. This may include working with a physical therapist or trainer to improve your strength, balance, and endurance.   This information is not intended to replace advice given to you by your health care provider. Make sure you discuss any questions you have with your health care provider.   Document Released: 07/17/2002 Document Revised: 12/11/2014 Document Reviewed: 08/31/2014 Elsevier Interactive Patient Education 2016 Elsevier Inc.  

## 2016-01-29 ENCOUNTER — Other Ambulatory Visit: Payer: Self-pay | Admitting: *Deleted

## 2016-01-29 MED ORDER — DILTIAZEM HCL 120 MG PO TABS: 120.0000 mg | ORAL_TABLET | Freq: Every day | ORAL | Status: DC

## 2016-02-07 ENCOUNTER — Other Ambulatory Visit: Payer: Self-pay | Admitting: Family Medicine

## 2016-02-07 DIAGNOSIS — J449 Chronic obstructive pulmonary disease, unspecified: Secondary | ICD-10-CM | POA: Diagnosis not present

## 2016-02-07 DIAGNOSIS — J188 Other pneumonia, unspecified organism: Secondary | ICD-10-CM | POA: Diagnosis not present

## 2016-02-13 ENCOUNTER — Ambulatory Visit (INDEPENDENT_AMBULATORY_CARE_PROVIDER_SITE_OTHER): Payer: Commercial Managed Care - HMO | Admitting: Pharmacist

## 2016-02-13 DIAGNOSIS — M810 Age-related osteoporosis without current pathological fracture: Secondary | ICD-10-CM | POA: Diagnosis not present

## 2016-02-13 DIAGNOSIS — Z9181 History of falling: Secondary | ICD-10-CM

## 2016-02-13 DIAGNOSIS — I482 Chronic atrial fibrillation: Secondary | ICD-10-CM

## 2016-02-13 DIAGNOSIS — R296 Repeated falls: Secondary | ICD-10-CM

## 2016-02-13 DIAGNOSIS — I4821 Permanent atrial fibrillation: Secondary | ICD-10-CM

## 2016-02-13 LAB — COAGUCHEK XS/INR WAIVED
INR: 2.3 — ABNORMAL HIGH (ref 0.9–1.1)
Prothrombin Time: 28.2 s

## 2016-02-13 MED ORDER — DENOSUMAB 60 MG/ML ~~LOC~~ SOLN
60.0000 mg | Freq: Once | SUBCUTANEOUS | Status: AC
  Administered 2016-02-13: 60 mg via SUBCUTANEOUS

## 2016-02-13 NOTE — Patient Instructions (Addendum)
Anticoagulation Dose Instructions as of 02/13/2016      [name] Tue Wed Thu Fri Sat   New Dose 1 mg 1 mg 1 mg 1 mg 2 mg 1 mg 1 mg    Description        Continue current dose of warfarin 2mg  - take 1/2 tablet daily except on Thursdays take 1 tablet.      INR was 2.3 today  Continue to take calcium daily   Fall Prevention in the Home  Falls can cause injuries and can affect people from all age groups. There are many simple things that you can do to make your home safe and to help prevent falls. WHAT CAN I DO ON THE OUTSIDE OF MY HOME?  Regularly repair the edges of walkways and driveways and fix any cracks.  Remove high doorway thresholds.  Trim any shrubbery on the main path into your home.  Use bright outdoor lighting.  Clear walkways of debris and clutter, including tools and rocks.  Regularly check that handrails are securely fastened and in good repair. Both sides of any steps should have handrails.  Install guardrails along the edges of any raised decks or porches.  Have leaves, snow, and ice cleared regularly.  Use sand or salt on walkways during winter months.  In the garage, clean up any spills right away, including grease or oil spills. WHAT CAN I DO IN THE BATHROOM?  Use night lights.  Install grab bars by the toilet and in the tub and shower. Do not use towel bars as grab bars.  Use non-skid mats or decals on the floor of the tub or shower.  If you need to sit down while you are in the shower, use a plastic, non-slip stool.[name]  Keep the floor dry. Immediately clean up any water that spills on the floor.  Remove soap buildup in the tub or shower on a regular basis.  Attach bath mats securely with double-sided non-slip rug tape.  Remove throw rugs and other tripping hazards from the floor. WHAT CAN I DO IN THE BEDROOM?  Use night lights.  Make sure that a bedside light is easy to reach.  Do not use oversized bedding that drapes onto the floor.  Have  a firm chair that has side arms to use for getting dressed.  Remove throw rugs and other tripping hazards from the floor. WHAT CAN I DO IN THE KITCHEN?   Clean up any spills right away.  Avoid walking on wet floors.  Place frequently used items in easy-to-reach places.  If you need to reach for something above you, use a sturdy step stool that has a grab bar.  Keep electrical cables out of the way.  Do not use floor polish or wax that makes floors slippery. If you have to use wax, make sure that it is non-skid floor wax.  Remove throw rugs and other tripping hazards from the floor. WHAT CAN I DO IN THE STAIRWAYS?  Do not leave any items on the stairs.  Make sure that there are handrails on both sides of the stairs. Fix handrails that are broken or loose. Make sure that handrails are as long as the stairways.  Check any carpeting to make sure that it is firmly attached to the stairs. Fix any carpet that is loose or worn.  Avoid having throw rugs at the top or bottom of stairways, or secure the rugs with carpet tape to prevent them from moving.  Make sure  that you have a light switch at the top of the stairs and the bottom of the stairs. If you do not have them, have them installed. WHAT ARE SOME OTHER FALL PREVENTION TIPS?  Wear closed-toe shoes that fit well and support your feet. Wear shoes that have rubber soles or low heels.  When you use a stepladder, make sure that it is completely opened and that the sides are firmly locked. Have someone hold the ladder while you are using it. Do not climb a closed stepladder.  Add color or contrast paint or tape to grab bars and handrails in your home. Place contrasting color strips on the first and last steps.  Use mobility aids as needed, such as canes, walkers, scooters, and crutches.  Turn on lights if it is dark. Replace any light bulbs that burn out.  Set up furniture so that there are clear paths. Keep the furniture in the same  spot.  Fix any uneven floor surfaces.  Choose a carpet design that does not hide the edge of steps of a stairway.  Be aware of any and all pets.  Review your medicines with your healthcare provider. Some medicines can cause dizziness or changes in blood pressure, which increase your risk of falling. Talk with your health care provider about other ways that you can decrease your risk of falls. This may include working with a physical therapist or trainer to improve your strength, balance, and endurance.   This information is not intended to replace advice given to you by your health care provider. Make sure you discuss any questions you have with your health care provider.   Document Released: 07/17/2002 Document Revised: 12/11/2014 Document Reviewed: 08/31/2014 Elsevier Interactive Patient Education [employer].

## 2016-02-13 NOTE — Progress Notes (Signed)
Subjective:     Indication: atrial fibrillation / osteoporosis Bleeding signs/symptoms: None Thromboembolic signs/symptoms: None  Missed Coumadin doses: None Medication changes: none Dietary changes: no Bacterial/viral infection: no Other concerns: yes - patient is due to have Prolia administered in office today.  Medication was shipped to our office from her mail order pharmacy and is supplied by patient Last DEXA was 12/05/2015 Lowest T-Score = -3.4 at L2 of spine and also L1-2 was -2.7 (11/01/2013)  Patient c/o decreased ablity to raise left arm above shoulder height.  She is due to see Dr Francesco Runner Monday July 10th - will discuss with him  The following portions of the patient's history were reviewed and updated as appropriate: allergies, current medications, past family history, past medical history, past social history, past surgical history and problem list.   Objective:    INR Today: 2.3 Current dose: warfarin 2mg  - 1 tablet on mondays and thursdays and 1/2 tablet all other days.   Assessment:    Therapeutic INR for goal of 2-3  Osteoporosis, post menopausal   Plan:    1. New dose: no change   2. Next INR: 1 month   3.  Prolia 60mg  administered SQ.  4.  CONTINUE CALCIUM INTAKE  5.  Follow up with pain management re: left shoulder pain (appt next week) 6.  Also spoke to PCP about referral for home health - education for ambulation; PT for strength building and fall prevention   Cherre Robins, PharmD, CPP

## 2016-02-17 ENCOUNTER — Encounter: Payer: Commercial Managed Care - HMO | Admitting: *Deleted

## 2016-02-17 ENCOUNTER — Telehealth: Payer: Self-pay | Admitting: Cardiology

## 2016-02-17 DIAGNOSIS — M5136 Other intervertebral disc degeneration, lumbar region: Secondary | ICD-10-CM | POA: Diagnosis not present

## 2016-02-17 DIAGNOSIS — Z79899 Other long term (current) drug therapy: Secondary | ICD-10-CM | POA: Diagnosis not present

## 2016-02-17 DIAGNOSIS — M25512 Pain in left shoulder: Secondary | ICD-10-CM | POA: Diagnosis not present

## 2016-02-17 DIAGNOSIS — M150 Primary generalized (osteo)arthritis: Secondary | ICD-10-CM | POA: Diagnosis not present

## 2016-02-17 DIAGNOSIS — M2550 Pain in unspecified joint: Secondary | ICD-10-CM | POA: Diagnosis not present

## 2016-02-17 DIAGNOSIS — M47817 Spondylosis without myelopathy or radiculopathy, lumbosacral region: Secondary | ICD-10-CM | POA: Diagnosis not present

## 2016-02-17 DIAGNOSIS — M4806 Spinal stenosis, lumbar region: Secondary | ICD-10-CM | POA: Diagnosis not present

## 2016-02-17 DIAGNOSIS — G8929 Other chronic pain: Secondary | ICD-10-CM | POA: Diagnosis not present

## 2016-02-17 NOTE — Telephone Encounter (Signed)
Attempted to confirm remote transmission with pt. No answer and was unable to leave a message.   

## 2016-02-20 DIAGNOSIS — E1151 Type 2 diabetes mellitus with diabetic peripheral angiopathy without gangrene: Secondary | ICD-10-CM | POA: Diagnosis not present

## 2016-02-20 DIAGNOSIS — B351 Tinea unguium: Secondary | ICD-10-CM | POA: Diagnosis not present

## 2016-02-20 DIAGNOSIS — L84 Corns and callosities: Secondary | ICD-10-CM | POA: Diagnosis not present

## 2016-02-20 NOTE — Addendum Note (Signed)
Addended by: [name] on: 02/20/2016 12:11 PM   Modules accepted: Orders

## 2016-02-21 ENCOUNTER — Encounter: Payer: Self-pay | Admitting: Cardiology

## 2016-02-21 NOTE — Addendum Note (Signed)
Addended by: [name] on: 02/21/2016 02:45 PM   Modules accepted: Orders

## 2016-02-24 DIAGNOSIS — M25512 Pain in left shoulder: Secondary | ICD-10-CM | POA: Diagnosis not present

## 2016-02-24 DIAGNOSIS — M6281 Muscle weakness (generalized): Secondary | ICD-10-CM | POA: Diagnosis not present

## 2016-02-24 DIAGNOSIS — R269 Unspecified abnormalities of gait and mobility: Secondary | ICD-10-CM | POA: Diagnosis not present

## 2016-02-24 DIAGNOSIS — M24512 Contracture, left shoulder: Secondary | ICD-10-CM | POA: Diagnosis not present

## 2016-02-24 DIAGNOSIS — M810 Age-related osteoporosis without current pathological fracture: Secondary | ICD-10-CM | POA: Diagnosis not present

## 2016-02-25 ENCOUNTER — Ambulatory Visit (INDEPENDENT_AMBULATORY_CARE_PROVIDER_SITE_OTHER): Payer: Commercial Managed Care - HMO | Admitting: *Deleted

## 2016-02-25 DIAGNOSIS — I495 Sick sinus syndrome: Secondary | ICD-10-CM

## 2016-02-26 ENCOUNTER — Telehealth: Payer: Self-pay | Admitting: Nurse Practitioner

## 2016-02-26 DIAGNOSIS — M810 Age-related osteoporosis without current pathological fracture: Secondary | ICD-10-CM | POA: Diagnosis not present

## 2016-02-26 DIAGNOSIS — M25512 Pain in left shoulder: Secondary | ICD-10-CM | POA: Diagnosis not present

## 2016-02-26 DIAGNOSIS — F419 Anxiety disorder, unspecified: Secondary | ICD-10-CM

## 2016-02-26 DIAGNOSIS — M24512 Contracture, left shoulder: Secondary | ICD-10-CM | POA: Diagnosis not present

## 2016-02-26 DIAGNOSIS — R269 Unspecified abnormalities of gait and mobility: Secondary | ICD-10-CM | POA: Diagnosis not present

## 2016-02-26 DIAGNOSIS — M6281 Muscle weakness (generalized): Secondary | ICD-10-CM | POA: Diagnosis not present

## 2016-02-26 MED ORDER — CLONAZEPAM 0.5 MG PO TABS: 0.5000 mg | ORAL_TABLET | Freq: Three times a day (TID) | ORAL | Status: DC | PRN

## 2016-02-26 NOTE — Progress Notes (Signed)
Remote pacemaker transmission.   

## 2016-02-26 NOTE — Telephone Encounter (Signed)
Phoned in.

## 2016-02-26 NOTE — Telephone Encounter (Signed)
Please call in clonazepam with 1 refills 

## 2016-02-27 ENCOUNTER — Encounter: Payer: Self-pay | Admitting: Cardiology

## 2016-02-27 DIAGNOSIS — M810 Age-related osteoporosis without current pathological fracture: Secondary | ICD-10-CM | POA: Diagnosis not present

## 2016-02-27 DIAGNOSIS — M24512 Contracture, left shoulder: Secondary | ICD-10-CM | POA: Diagnosis not present

## 2016-02-27 DIAGNOSIS — R269 Unspecified abnormalities of gait and mobility: Secondary | ICD-10-CM | POA: Diagnosis not present

## 2016-02-27 DIAGNOSIS — M25512 Pain in left shoulder: Secondary | ICD-10-CM | POA: Diagnosis not present

## 2016-02-27 DIAGNOSIS — M6281 Muscle weakness (generalized): Secondary | ICD-10-CM | POA: Diagnosis not present

## 2016-02-28 LAB — CUP PACEART REMOTE DEVICE CHECK
Battery Voltage: 2.96 V
Brady Statistic RV Percent Paced: 7.1 %
Date Time Interrogation Session: 20170718224331
Implantable Lead Model: 1948
Lead Channel Impedance Value: 830 Ohm
Lead Channel Setting Pacing Amplitude: 2.5 V
Lead Channel Setting Pacing Pulse Width: 0.5 ms
Lead Channel Setting Sensing Sensitivity: 2 mV
MDC IDC LEAD IMPLANT DT: 20120323
MDC IDC LEAD LOCATION: 753860
MDC IDC MSMT BATTERY REMAINING LONGEVITY: 144 mo
MDC IDC MSMT BATTERY REMAINING PERCENTAGE: 95.5 %
MDC IDC MSMT LEADCHNL RV SENSING INTR AMPL: 12 mV
Pulse Gen Serial Number: 7199163

## 2016-03-02 ENCOUNTER — Telehealth: Payer: Self-pay | Admitting: Nurse Practitioner

## 2016-03-02 DIAGNOSIS — M6281 Muscle weakness (generalized): Secondary | ICD-10-CM | POA: Diagnosis not present

## 2016-03-02 DIAGNOSIS — R269 Unspecified abnormalities of gait and mobility: Secondary | ICD-10-CM | POA: Diagnosis not present

## 2016-03-02 DIAGNOSIS — M25512 Pain in left shoulder: Secondary | ICD-10-CM | POA: Diagnosis not present

## 2016-03-02 DIAGNOSIS — M24512 Contracture, left shoulder: Secondary | ICD-10-CM | POA: Diagnosis not present

## 2016-03-02 DIAGNOSIS — M810 Age-related osteoporosis without current pathological fracture: Secondary | ICD-10-CM | POA: Diagnosis not present

## 2016-03-02 NOTE — Telephone Encounter (Signed)
Will put in with [facility]

## 2016-03-04 DIAGNOSIS — M25512 Pain in left shoulder: Secondary | ICD-10-CM | POA: Diagnosis not present

## 2016-03-04 DIAGNOSIS — M24512 Contracture, left shoulder: Secondary | ICD-10-CM | POA: Diagnosis not present

## 2016-03-04 DIAGNOSIS — M810 Age-related osteoporosis without current pathological fracture: Secondary | ICD-10-CM | POA: Diagnosis not present

## 2016-03-04 DIAGNOSIS — R269 Unspecified abnormalities of gait and mobility: Secondary | ICD-10-CM | POA: Diagnosis not present

## 2016-03-04 DIAGNOSIS — M6281 Muscle weakness (generalized): Secondary | ICD-10-CM | POA: Diagnosis not present

## 2016-03-06 DIAGNOSIS — M810 Age-related osteoporosis without current pathological fracture: Secondary | ICD-10-CM | POA: Diagnosis not present

## 2016-03-06 DIAGNOSIS — R269 Unspecified abnormalities of gait and mobility: Secondary | ICD-10-CM | POA: Diagnosis not present

## 2016-03-06 DIAGNOSIS — M6281 Muscle weakness (generalized): Secondary | ICD-10-CM | POA: Diagnosis not present

## 2016-03-06 DIAGNOSIS — M25512 Pain in left shoulder: Secondary | ICD-10-CM | POA: Diagnosis not present

## 2016-03-06 DIAGNOSIS — M24512 Contracture, left shoulder: Secondary | ICD-10-CM | POA: Diagnosis not present

## 2016-03-08 DIAGNOSIS — J188 Other pneumonia, unspecified organism: Secondary | ICD-10-CM | POA: Diagnosis not present

## 2016-03-08 DIAGNOSIS — J449 Chronic obstructive pulmonary disease, unspecified: Secondary | ICD-10-CM | POA: Diagnosis not present

## 2016-03-09 ENCOUNTER — Ambulatory Visit (INDEPENDENT_AMBULATORY_CARE_PROVIDER_SITE_OTHER): Payer: Commercial Managed Care - HMO | Admitting: Pediatrics

## 2016-03-09 DIAGNOSIS — M6281 Muscle weakness (generalized): Secondary | ICD-10-CM | POA: Diagnosis not present

## 2016-03-09 DIAGNOSIS — Z87310 Personal history of (healed) osteoporosis fracture: Secondary | ICD-10-CM

## 2016-03-09 DIAGNOSIS — E119 Type 2 diabetes mellitus without complications: Secondary | ICD-10-CM | POA: Diagnosis not present

## 2016-03-09 DIAGNOSIS — Z9981 Dependence on supplemental oxygen: Secondary | ICD-10-CM

## 2016-03-09 DIAGNOSIS — M25512 Pain in left shoulder: Secondary | ICD-10-CM

## 2016-03-09 DIAGNOSIS — M810 Age-related osteoporosis without current pathological fracture: Secondary | ICD-10-CM | POA: Diagnosis not present

## 2016-03-09 DIAGNOSIS — J449 Chronic obstructive pulmonary disease, unspecified: Secondary | ICD-10-CM | POA: Diagnosis not present

## 2016-03-09 DIAGNOSIS — R269 Unspecified abnormalities of gait and mobility: Secondary | ICD-10-CM | POA: Diagnosis not present

## 2016-03-09 DIAGNOSIS — I10 Essential (primary) hypertension: Secondary | ICD-10-CM | POA: Diagnosis not present

## 2016-03-09 DIAGNOSIS — Z9181 History of falling: Secondary | ICD-10-CM

## 2016-03-09 DIAGNOSIS — I482 Chronic atrial fibrillation: Secondary | ICD-10-CM

## 2016-03-09 DIAGNOSIS — M24512 Contracture, left shoulder: Secondary | ICD-10-CM | POA: Diagnosis not present

## 2016-03-10 DIAGNOSIS — M810 Age-related osteoporosis without current pathological fracture: Secondary | ICD-10-CM | POA: Diagnosis not present

## 2016-03-10 DIAGNOSIS — M25512 Pain in left shoulder: Secondary | ICD-10-CM | POA: Diagnosis not present

## 2016-03-10 DIAGNOSIS — M6281 Muscle weakness (generalized): Secondary | ICD-10-CM | POA: Diagnosis not present

## 2016-03-10 DIAGNOSIS — R269 Unspecified abnormalities of gait and mobility: Secondary | ICD-10-CM | POA: Diagnosis not present

## 2016-03-10 DIAGNOSIS — M24512 Contracture, left shoulder: Secondary | ICD-10-CM | POA: Diagnosis not present

## 2016-03-11 DIAGNOSIS — R269 Unspecified abnormalities of gait and mobility: Secondary | ICD-10-CM | POA: Diagnosis not present

## 2016-03-11 DIAGNOSIS — M6281 Muscle weakness (generalized): Secondary | ICD-10-CM | POA: Diagnosis not present

## 2016-03-11 DIAGNOSIS — M810 Age-related osteoporosis without current pathological fracture: Secondary | ICD-10-CM | POA: Diagnosis not present

## 2016-03-11 DIAGNOSIS — M24512 Contracture, left shoulder: Secondary | ICD-10-CM | POA: Diagnosis not present

## 2016-03-11 DIAGNOSIS — M25512 Pain in left shoulder: Secondary | ICD-10-CM | POA: Diagnosis not present

## 2016-03-16 ENCOUNTER — Ambulatory Visit (INDEPENDENT_AMBULATORY_CARE_PROVIDER_SITE_OTHER): Payer: Commercial Managed Care - HMO | Admitting: Cardiovascular Disease

## 2016-03-16 ENCOUNTER — Encounter: Payer: Self-pay | Admitting: *Deleted

## 2016-03-16 ENCOUNTER — Encounter: Payer: Self-pay | Admitting: Cardiovascular Disease

## 2016-03-16 VITALS — BP 124/72 | HR 80 | Ht 62.0 in | Wt 134.0 lb

## 2016-03-16 DIAGNOSIS — I4821 Permanent atrial fibrillation: Secondary | ICD-10-CM

## 2016-03-16 DIAGNOSIS — I482 Chronic atrial fibrillation: Secondary | ICD-10-CM | POA: Diagnosis not present

## 2016-03-16 DIAGNOSIS — Z950 Presence of cardiac pacemaker: Secondary | ICD-10-CM

## 2016-03-16 DIAGNOSIS — R5383 Other fatigue: Secondary | ICD-10-CM

## 2016-03-16 DIAGNOSIS — I25118 Atherosclerotic heart disease of native coronary artery with other forms of angina pectoris: Secondary | ICD-10-CM

## 2016-03-16 DIAGNOSIS — I350 Nonrheumatic aortic (valve) stenosis: Secondary | ICD-10-CM

## 2016-03-16 DIAGNOSIS — I779 Disorder of arteries and arterioles, unspecified: Secondary | ICD-10-CM

## 2016-03-16 DIAGNOSIS — I5032 Chronic diastolic (congestive) heart failure: Secondary | ICD-10-CM | POA: Diagnosis not present

## 2016-03-16 DIAGNOSIS — I739 Peripheral vascular disease, unspecified: Secondary | ICD-10-CM

## 2016-03-16 DIAGNOSIS — Z720 Tobacco use: Secondary | ICD-10-CM

## 2016-03-16 DIAGNOSIS — I10 Essential (primary) hypertension: Secondary | ICD-10-CM

## 2016-03-16 NOTE — Progress Notes (Signed)
SUBJECTIVE: The patient is here for routine cardiovascular follow up. She has COPD, chronic diastolic heart failure, permanent atrial fibrillation/flutter and has a pacemaker.   She was previoulsy taken off of metoprolol by Dr. Rayann Heman given her significant COPD with wheezing.  She has not tolerated higher doses of diltiazem in the past as it led to low blood pressures.  She also has a h/o mild nonobstructive CAD and a previous tachycardia-induced cardiomyopathy which has since normalized in function.   Coronary angiography on 04/04/2010 demonstrated a 40% proximal LAD lesion, 20% mid left circumflex coronary artery lesion, and the RCA had a 20% proximal, 50-60% mid vessel, and distal 30% stenosis.  Echocardiogram on 10/09/14 demonstrated normal left ventricular systolic function, EF [id number], diastolic dysfunction, high filling pressures, mild LVH, mild aortic stenosis, mild mitral regurgitation, severe left atrial enlargement, moderate right atrial enlargement, and mildly reduced right ventricular systolic function with mild tricuspid regurgitation and mildly elevated pulmonary pressures.  Currently denies chest pain. No change in baseline exertional dyspnea. However, she has been experiencing increasing fatigue since her last visit with me.    Review of Systems: As per "subjective", otherwise negative.  Allergies  Allergen Reactions  . Torsemide Nausea And Vomiting  . Macrobid [Nitrofurantoin Monohyd Macro] Rash  . Tramadol Nausea Only    Current Outpatient Prescriptions  Medication Sig Dispense Refill  . acetaminophen (TYLENOL) 500 MG tablet Take 1,000 mg by mouth every 6 (six) hours as needed.    [name] albuterol (PROVENTIL HFA;VENTOLIN HFA) 108 (90 Base) MCG/ACT inhaler Inhale 2 puffs into the lungs every 6 (six) hours as needed for wheezing or shortness of breath. 1 Inhaler 2  . Blood Glucose Calibration (TRUE METRIX LEVEL 1) LOW SOLN Use weekly 3 each 1  . Blood Glucose  Monitoring Suppl (TRUE METRIX AIR GLUCOSE METER) DEVI 1 Device by Does not apply route 2 (two) times daily. Use to Test blood sugar bid. DX E13.10 1 Device 0  . calcium-vitamin D 250-100 MG-UNIT tablet Take 2 tablets by mouth daily.    . clonazePAM (KLONOPIN) 0.5 MG tablet Take 1 tablet (0.5 mg total) by mouth 3 (three) times daily as needed for anxiety. 90 tablet 1  . CREON 36000 units CPEP capsule TAKE 3 CAPSULES WITH MEALS  AND TAKE 1 CAPSULE WITH A SNACK AS DIRECTED 1000 capsule 2  . denosumab (PROLIA) 60 MG/ML SOLN injection INJECT 60 MG SQ every 6 months. 1 mL 0  . diltiazem (CARDIZEM) 120 MG tablet Take 1 tablet (120 mg total) by mouth daily. 90 tablet 1  . ferrous sulfate 325 (65 FE) MG tablet Take 1 tablet (325 mg total) by mouth daily with breakfast.  3  . furosemide (LASIX) 40 MG tablet TAKE 1 TABLET EVERY DAY 90 tablet 1  . HYDROcodone-acetaminophen (NORCO) 7.5-325 MG tablet Take 1 tablet by mouth 3 (three) times daily as needed.    [name] ipratropium-albuterol (DUONEB) 0.5-2.5 (3) MG/3ML SOLN Take 3 mLs by nebulization every 4 (four) hours as needed. 360 mL 1  . JANUVIA 50 MG tablet TAKE 1 TABLET EVERY DAY 90 tablet 1  . nitroGLYCERIN (NITROSTAT) 0.4 MG SL tablet Place 1 tablet (0.4 mg total) under the tongue every 5 (five) minutes as needed for chest pain. 25 tablet 3  . pantoprazole (PROTONIX) 40 MG tablet TAKE 1 TABLET TWICE DAILY 180 tablet 1  . potassium chloride SA (K-DUR,KLOR-CON) 20 MEQ tablet TAKE 1 TABLET EVERY MORNING 90 tablet 1  . Probiotic Product (  PHILLIPS COLON HEALTH) CAPS Take 1 capsule by mouth daily.    . simvastatin (ZOCOR) 10 MG tablet TAKE 1 TABLET EVERY DAY 90 tablet 1  . SYMBICORT 160-4.5 MCG/ACT inhaler INHALE 2 PUFFS TWICE DAILY 3 Inhaler 1  . TRUE METRIX BLOOD GLUCOSE TEST test strip TEST TWICE DAILY AS DIRECTED 200 each 2  . TRUEPLUS LANCETS 28G MISC TEST BLOOD SUGAR TWICE DAILY 200 each 2  . warfarin (COUMADIN) 2 MG tablet TAKE PER PHYSICIAN INSTRUCTIONS  (Patient taking differently: TAKE 2MG  ON MONDAYS AND THURSDAYS, THEN TAKE 1MG  ON ALL OTHER DAYS) 135 tablet 1   No current facility-administered medications for this visit.     Past Medical History:  Diagnosis Date  . Abnormal CT of the chest    Mediastinal and hilar adenopathy followed by Dr. Koleen Nimrod in [place]  . Anemia, unspecified   . Body mass index (BMI) of 20.0-20.9 in adult FEB 2013 147 LBS  . CAD (coronary artery disease) 2011   Catheterization August 2011: mild nonobstructive coronary artery disease complicated by large left rectus sheath hematoma status post evacuation Coumadin restarted without recurrence  . Carotid artery disease ([place])    Doppler, 05/2012, 0-39% bilateral  . Cataract   . Chronic airway obstruction, not elsewhere classified   . Chronic kidney disease, stage II (mild)   . Congestive heart failure, unspecified    EF now 60%, previous nonischemic cardiomyopathy  . Diabetes mellitus   . Dilated bile duct 10/19/2011  . Diverticulitis Dec 2012  . Gastritis    By EGD  . GERD (gastroesophageal reflux disease)   . Hypercholesterolemia   . Mitral regurgitation 06/27/2013  . Neurogenic sleep apnea    Uses noctural oxygen prescribed by her pulmonologi  . Pacemaker    Single chamber [place]. Jude ACCENT [phone] SN 719 04/11/1957 10/31/2010  . Permanent atrial fibrillation (HCC)    H/o tachybradycardia syndrome on Coumadin anticoagulation, has pacemaker.  . Recurrent UTI   . Rheumatic heart disease   . Tachycardia-bradycardia [facility])    s/p St. Jude single chamber PPM 10/2010   . Unspecified hypertensive kidney disease with chronic kidney disease stage I through stage IV, or unspecified   . Valvular heart disease    a. H/o moderate mitral stenosis by cath 2011 but no mention of this on 10/2011 echo. b. 10/2011 echo: mod MR, mild AS, mild TR; EF>65%  . Vitamin D deficiency     Past Surgical History:  Procedure Laterality Date  . CHOLECYSTECTOMY     40+ years ago  .  COLONOSCOPY  10/2011   Dyann Ruddle sigmoid to descending diverticulosis, internal hemorrhoids  . ESOPHAGOGASTRODUODENOSCOPY  10/2011   Fields-erosive gastritis, biopsy with no H. pylori, hiatal hernia, peptic duodenitis, no celiac disease, duodenal diverticulum, duodenal nodule  . EUS  11/16/11   Mishra-13 MM CBD, normal pancreatic duct, otherwise normal EUS  . Evacuation of retroperitoneal and left rectus sheath    . HEMATOMA EVACUATION     femoral  . HERNIA REPAIR     X3  . PILONIDAL CYST / SINUS EXCISION    . Single-chamber pacemaker implantation  3/12   by Dr Caryl Comes    Social History   Social History  . Marital status: Single    Spouse name: N/A  . Number of children: 2  . Years of education: N/A   Occupational History  . RETIRED Retired   Social History Main Topics  . Smoking status: Current Every Day Smoker    Packs/day: 0.25  Years: 45.00    Types: Cigarettes    Start date: 02/05/1958  . Smokeless tobacco: Never Used     Comment: smokes 5-6 cigarettes daily  . Alcohol use No  . Drug use: No  . Sexual activity: Not Currently   Other Topics Concern  . Not on file   Social History Narrative  . No narrative on file     Vitals:   03/16/16 1503  BP: 124/72  Pulse: 80  SpO2: 100%  Weight: 134 lb (60.8 kg)  Height: 5\' 2"  (1.575 m)    PHYSICAL EXAM General: NAD HEENT: Poor dentition.  Neck: No JVD, no thyromegaly. Lungs: Prolonged expiratory phase with end-expiratory wheezes, no rales, poor air entry. CV: Nondisplaced PMI. Regular rate and rhythm, normal S1/S2, no S3/S4, II/VI ejection systolic murmur heard at RUSB and LUSB. No pretibial or periankle edema. Chronic stasis dermatitis. Abdomen: Soft, nontender, no distention.  Neurologic: Alert and oriented.  Psych: Normal affect. Skin: Normal. Musculoskeletal: No gross deformities. Extremities: No clubbing or cyanosis.     ECG: Most recent ECG reviewed.      ASSESSMENT AND PLAN: 1.  Permanent atrial fibrillation s/p pacemaker: Continue long-acting diltiazem 120 mg. I am not inclined to reintroduce metoprolol in the future should palpitations recur given her severe COPD. Continue warfarin. No bleeding complications noted.   2. Valvular heart disease: Mild aortic stenosis and mitral and tricuspid regurgitation as noted above. Will monitor.  3. Carotid artery stenosis: Mild bilaterally in October 2013.   4. Chronic diastolic heart failure: Euvolemic. No changes to therapy.  5. Tobacco abuse: Cessation counseling previously provided.  6. Type II diabetes: On Januvia.   7. COPD: Not decompensated at present.  8. Fatigue in context of CAD: RCA lesion was 50-60% in 2011 and I wonder if this has become hemodynamically significant. Will proceed with a dobutamine Myoview stress test. Continue SL nitroglycerin to be used prn. Continue statin.  Dispo: f/u 6 weeks.   Kate Sable, M.D., F.A.C.C.

## 2016-03-16 NOTE — Patient Instructions (Signed)
Medication Instructions:  Continue all current medications.  Labwork: None.  Testing/Procedures:  Your physician has requested that you have a dobutamine myoview. For furth information please visit [email or web address]. Please follow instruction sheet, as given.  Office will contact with results via phone or letter.    Follow-Up: 6 weeks   Any Other Special Instructions Will Be Listed Below (If Applicable).  If you need a refill on your cardiac medications before your next appointment, please call your pharmacy.

## 2016-03-16 NOTE — Addendum Note (Signed)
Addended by: Laurine Blazer on: 03/16/2016 03:58 PM   Modules accepted: Orders

## 2016-03-17 ENCOUNTER — Other Ambulatory Visit: Payer: Self-pay | Admitting: Nurse Practitioner

## 2016-03-17 DIAGNOSIS — M24512 Contracture, left shoulder: Secondary | ICD-10-CM | POA: Diagnosis not present

## 2016-03-17 DIAGNOSIS — M25512 Pain in left shoulder: Secondary | ICD-10-CM | POA: Diagnosis not present

## 2016-03-17 DIAGNOSIS — M6281 Muscle weakness (generalized): Secondary | ICD-10-CM | POA: Diagnosis not present

## 2016-03-17 DIAGNOSIS — R269 Unspecified abnormalities of gait and mobility: Secondary | ICD-10-CM | POA: Diagnosis not present

## 2016-03-17 DIAGNOSIS — M810 Age-related osteoporosis without current pathological fracture: Secondary | ICD-10-CM | POA: Diagnosis not present

## 2016-03-18 ENCOUNTER — Encounter: Payer: Self-pay | Admitting: Cardiology

## 2016-03-19 ENCOUNTER — Ambulatory Visit (INDEPENDENT_AMBULATORY_CARE_PROVIDER_SITE_OTHER): Payer: Commercial Managed Care - HMO | Admitting: Pharmacist

## 2016-03-19 ENCOUNTER — Other Ambulatory Visit: Payer: Self-pay | Admitting: Pediatrics

## 2016-03-19 DIAGNOSIS — M549 Dorsalgia, unspecified: Secondary | ICD-10-CM | POA: Diagnosis not present

## 2016-03-19 DIAGNOSIS — I4821 Permanent atrial fibrillation: Secondary | ICD-10-CM

## 2016-03-19 DIAGNOSIS — I482 Chronic atrial fibrillation: Secondary | ICD-10-CM | POA: Diagnosis not present

## 2016-03-19 DIAGNOSIS — Z01812 Encounter for preprocedural laboratory examination: Secondary | ICD-10-CM | POA: Diagnosis not present

## 2016-03-19 LAB — COAGUCHEK XS/INR WAIVED
INR: 2.6 — AB (ref 0.9–1.1)
Prothrombin Time: 31.6 s

## 2016-03-23 DIAGNOSIS — M5136 Other intervertebral disc degeneration, lumbar region: Secondary | ICD-10-CM | POA: Diagnosis not present

## 2016-03-23 DIAGNOSIS — G8929 Other chronic pain: Secondary | ICD-10-CM | POA: Diagnosis not present

## 2016-03-23 DIAGNOSIS — M150 Primary generalized (osteo)arthritis: Secondary | ICD-10-CM | POA: Diagnosis not present

## 2016-03-23 DIAGNOSIS — M25512 Pain in left shoulder: Secondary | ICD-10-CM | POA: Diagnosis not present

## 2016-03-23 DIAGNOSIS — Z79899 Other long term (current) drug therapy: Secondary | ICD-10-CM | POA: Diagnosis not present

## 2016-03-23 DIAGNOSIS — M47817 Spondylosis without myelopathy or radiculopathy, lumbosacral region: Secondary | ICD-10-CM | POA: Diagnosis not present

## 2016-03-23 DIAGNOSIS — M4806 Spinal stenosis, lumbar region: Secondary | ICD-10-CM | POA: Diagnosis not present

## 2016-03-23 DIAGNOSIS — M2550 Pain in unspecified joint: Secondary | ICD-10-CM | POA: Diagnosis not present

## 2016-03-24 ENCOUNTER — Encounter (HOSPITAL_COMMUNITY)
Admission: RE | Admit: 2016-03-24 | Discharge: 2016-03-24 | Disposition: A | Discharge status: Home / Self Care | Payer: Commercial Managed Care - HMO | Source: Ambulatory Visit | Attending: Cardiovascular Disease | Admitting: Cardiovascular Disease

## 2016-03-24 ENCOUNTER — Inpatient Hospital Stay (HOSPITAL_COMMUNITY): Admission: RE | Admit: 2016-03-24 | Payer: Commercial Managed Care - HMO | Source: Ambulatory Visit

## 2016-03-24 ENCOUNTER — Encounter (HOSPITAL_COMMUNITY): Payer: Self-pay

## 2016-03-24 DIAGNOSIS — R5383 Other fatigue: Secondary | ICD-10-CM

## 2016-03-24 DIAGNOSIS — I25118 Atherosclerotic heart disease of native coronary artery with other forms of angina pectoris: Secondary | ICD-10-CM | POA: Insufficient documentation

## 2016-03-24 HISTORY — DX: Essential (primary) hypertension: I10

## 2016-03-24 LAB — NM MYOCAR MULTI W/SPECT W/WALL MOTION / EF
CHL CUP NUCLEAR SRS: 0
CHL CUP NUCLEAR SSS: 0
CHL CUP RESTING HR STRESS: 75 {beats}/min
LV dias vol: 69 mL (ref 46–106)
LV sys vol: 42 mL
NUC STRESS TID: 1.11
Peak HR: 146 {beats}/min
RATE: 0.17
SDS: 0

## 2016-03-24 MED ORDER — DOBUTAMINE IN D5W 4-5 MG/ML-% IV SOLN
INTRAVENOUS | Status: AC
  Administered 2016-03-24: 1000000 ug via INTRAVENOUS
  Filled 2016-03-24: qty 250

## 2016-03-24 MED ORDER — TECHNETIUM TC 99M TETROFOSMIN IV KIT
30.0000 | PACK | Freq: Once | INTRAVENOUS | Status: AC | PRN
  Administered 2016-03-24: 30.2 via INTRAVENOUS

## 2016-03-24 MED ORDER — TECHNETIUM TC 99M TETROFOSMIN IV KIT
10.0000 | PACK | Freq: Once | INTRAVENOUS | Status: AC | PRN
  Administered 2016-03-24: 10.3 via INTRAVENOUS

## 2016-03-25 ENCOUNTER — Other Ambulatory Visit: Payer: Self-pay

## 2016-03-25 MED ORDER — PANCRELIPASE (LIP-PROT-AMYL) 36000-114000 UNITS PO CPEP: ORAL_CAPSULE | ORAL | 0 refills | Status: DC

## 2016-03-30 ENCOUNTER — Other Ambulatory Visit: Payer: Self-pay | Admitting: Nurse Practitioner

## 2016-04-08 ENCOUNTER — Ambulatory Visit (INDEPENDENT_AMBULATORY_CARE_PROVIDER_SITE_OTHER): Payer: Commercial Managed Care - HMO | Admitting: Internal Medicine

## 2016-04-08 ENCOUNTER — Encounter: Payer: Self-pay | Admitting: Internal Medicine

## 2016-04-08 VITALS — BP 102/52 | HR 65 | Ht 62.0 in | Wt 136.0 lb

## 2016-04-08 DIAGNOSIS — I10 Essential (primary) hypertension: Secondary | ICD-10-CM

## 2016-04-08 DIAGNOSIS — J449 Chronic obstructive pulmonary disease, unspecified: Secondary | ICD-10-CM | POA: Diagnosis not present

## 2016-04-08 DIAGNOSIS — Z720 Tobacco use: Secondary | ICD-10-CM | POA: Diagnosis not present

## 2016-04-08 DIAGNOSIS — J188 Other pneumonia, unspecified organism: Secondary | ICD-10-CM | POA: Diagnosis not present

## 2016-04-08 DIAGNOSIS — Z950 Presence of cardiac pacemaker: Secondary | ICD-10-CM | POA: Diagnosis not present

## 2016-04-08 DIAGNOSIS — I495 Sick sinus syndrome: Secondary | ICD-10-CM

## 2016-04-08 DIAGNOSIS — I482 Chronic atrial fibrillation: Secondary | ICD-10-CM

## 2016-04-08 DIAGNOSIS — I4821 Permanent atrial fibrillation: Secondary | ICD-10-CM

## 2016-04-08 DIAGNOSIS — R0602 Shortness of breath: Secondary | ICD-10-CM

## 2016-04-08 LAB — CUP PACEART INCLINIC DEVICE CHECK
Battery Voltage: 2.96 V
Date Time Interrogation Session: 20170830142609
Implantable Lead Implant Date: 20120323
Implantable Lead Model: 1948
Lead Channel Pacing Threshold Amplitude: 0.5 V
Lead Channel Pacing Threshold Amplitude: 0.5 V
Lead Channel Pacing Threshold Pulse Width: 0.5 ms
Lead Channel Pacing Threshold Pulse Width: 0.5 ms
Lead Channel Setting Pacing Pulse Width: 0.5 ms
MDC IDC LEAD LOCATION: 753860
MDC IDC MSMT BATTERY REMAINING LONGEVITY: 144 mo
MDC IDC MSMT LEADCHNL RV IMPEDANCE VALUE: 762.5 Ohm
MDC IDC MSMT LEADCHNL RV SENSING INTR AMPL: 10.6 mV
MDC IDC SET LEADCHNL RV PACING AMPLITUDE: 2.5 V
MDC IDC SET LEADCHNL RV SENSING SENSITIVITY: 2 mV
MDC IDC STAT BRADY RV PERCENT PACED: 6.2 %
Pulse Gen Serial Number: 7199163

## 2016-04-08 NOTE — Addendum Note (Signed)
Addended by: Laurine Blazer on: 04/08/2016 03:06 PM   Modules accepted: Orders

## 2016-04-08 NOTE — Patient Instructions (Signed)
Medication Instructions:  Continue all current medications.  Labwork: none  Testing/Procedures:  Your physician has requested that you have an echocardiogram. Echocardiography is a painless test that uses sound waves to create images of your heart. It provides your doctor with information about the size and shape of your heart and how well your heart's chambers and valves are working. This procedure takes approximately one hour. There are no restrictions for this procedure.  Office will contact with results via phone or letter.    Follow-Up: Your physician wants you to follow up in:  1 year.  You will receive a reminder letter in the mail one-two months in advance.  If you don't receive a letter, please call our office to schedule the follow up appointment   Any Other Special Instructions Will Be Listed Below (If Applicable). Remote monitoring is used to monitor your Pacemaker of ICD from home. This monitoring reduces the number of office visits required to check your device to one time per year. It allows [place] to keep an eye on the functioning of your device to ensure it is working properly. You are scheduled for a device check from home on 07/08/2016. You may send your transmission at any time that day. If you have a wireless device, the transmission will be sent automatically. After your physician reviews your transmission, you will receive a postcard with your next transmission date.    If you need a refill on your cardiac medications before your next appointment, please call your pharmacy.

## 2016-04-08 NOTE — Progress Notes (Signed)
Primary Cardiologist:  Dr Bronson Ing PCP:  Dr Quillian Quince  The patient presents today for routine electrophysiology followup. She appears stable. She is chronically debilitated with multiple comorbidities.   She continues to smoke but is trying to quit. + chronic SOB.   Today, she denies symptoms of palpitations, chest pain, dizziness, presyncope, syncope, or neurologic sequela. The patient feels that she is tolerating medications without difficulties and is otherwise without complaint today.    Past Medical History:  Diagnosis Date  . Abnormal CT of the chest    Mediastinal and hilar adenopathy followed by Dr. Koleen Nimrod in [place]  . Anemia, unspecified   . Body mass index (BMI) of 20.0-20.9 in adult FEB 2013 147 LBS  . CAD (coronary artery disease) 2011   Catheterization August 2011: mild nonobstructive coronary artery disease complicated by large left rectus sheath hematoma status post evacuation Coumadin restarted without recurrence  . Carotid artery disease ([place])    Doppler, 05/2012, 0-39% bilateral  . Cataract   . Chronic airway obstruction, not elsewhere classified   . Chronic kidney disease, stage II (mild)   . Congestive heart failure, unspecified    EF now 60%, previous nonischemic cardiomyopathy  . Diabetes mellitus   . Dilated bile duct 10/19/2011  . Diverticulitis Dec 2012  . Gastritis    By EGD  . GERD (gastroesophageal reflux disease)   . Hypercholesterolemia   . Hypertension   . Mitral regurgitation 06/27/2013  . Neurogenic sleep apnea    Uses noctural oxygen prescribed by her pulmonologi  . Pacemaker    Single chamber [place]. Jude ACCENT [phone] SN 719 04/11/1957 10/31/2010  . Permanent atrial fibrillation (HCC)    H/o tachybradycardia syndrome on Coumadin anticoagulation, has pacemaker.  . Recurrent UTI   . Rheumatic heart disease   . Tachycardia-bradycardia [facility])    s/p St. Jude single chamber PPM 10/2010   . Unspecified hypertensive kidney disease with chronic kidney  disease stage I through stage IV, or unspecified   . Valvular heart disease    a. H/o moderate mitral stenosis by cath 2011 but no mention of this on 10/2011 echo. b. 10/2011 echo: mod MR, mild AS, mild TR; EF>65%  . Vitamin D deficiency    Past Surgical History:  Procedure Laterality Date  . CHOLECYSTECTOMY     40+ years ago  . COLONOSCOPY  10/2011   Dyann Ruddle sigmoid to descending diverticulosis, internal hemorrhoids  . ESOPHAGOGASTRODUODENOSCOPY  10/2011   Fields-erosive gastritis, biopsy with no H. pylori, hiatal hernia, peptic duodenitis, no celiac disease, duodenal diverticulum, duodenal nodule  . EUS  11/16/11   Mishra-13 MM CBD, normal pancreatic duct, otherwise normal EUS  . Evacuation of retroperitoneal and left rectus sheath    . HEMATOMA EVACUATION     femoral  . HERNIA REPAIR     X3  . PILONIDAL CYST / SINUS EXCISION    . Single-chamber pacemaker implantation  3/12   by Dr Caryl Comes    Current Outpatient Prescriptions  Medication Sig Dispense Refill  . acetaminophen (TYLENOL) 500 MG tablet Take 1,000 mg by mouth every 6 (six) hours as needed.    [name] albuterol (PROVENTIL HFA;VENTOLIN HFA) 108 (90 Base) MCG/ACT inhaler Inhale 2 puffs into the lungs every 6 (six) hours as needed for wheezing or shortness of breath. 1 Inhaler 2  . Blood Glucose Calibration (TRUE METRIX LEVEL 1) LOW SOLN Use weekly 3 each 1  . Blood Glucose Monitoring Suppl (TRUE METRIX AIR GLUCOSE METER) DEVI 1 Device by Does  not apply route 2 (two) times daily. Use to Test blood sugar bid. DX E13.10 1 Device 0  . calcium-vitamin D 250-100 MG-UNIT tablet Take 2 tablets by mouth daily.    . clonazePAM (KLONOPIN) 0.5 MG tablet Take 1 tablet (0.5 mg total) by mouth 3 (three) times daily as needed for anxiety. 90 tablet 1  . denosumab (PROLIA) 60 MG/ML SOLN injection INJECT 60 MG SQ every 6 months. 1 mL 0  . diltiazem (CARDIZEM) 120 MG tablet Take 1 tablet (120 mg total) by mouth daily. 90 tablet 1  . ferrous sulfate  325 (65 FE) MG tablet Take 1 tablet (325 mg total) by mouth daily with breakfast.  3  . furosemide (LASIX) 40 MG tablet TAKE 1 TABLET EVERY DAY 90 tablet 1  . HYDROcodone-acetaminophen (NORCO) 7.5-325 MG tablet Take 1 tablet by mouth 3 (three) times daily as needed.    [name] HYDROcodone-acetaminophen (NORCO/VICODIN) 5-325 MG tablet     . ipratropium-albuterol (DUONEB) 0.5-2.5 (3) MG/3ML SOLN INHALE THE CONTENTS OF 1 VIAL VIA NEBULIZER EVERY 4 HOURS AS NEEDED 360 mL 1  . JANUVIA 50 MG tablet TAKE 1 TABLET EVERY DAY 90 tablet 1  . lipase/protease/amylase (CREON) 36000 UNITS CPEP capsule TAKE 3 CAPSULES WITH MEALS  AND TAKE 1 CAPSULE WITH A SNACK AS DIRECTED 1000 capsule 0  . nitroGLYCERIN (NITROSTAT) 0.4 MG SL tablet Place 1 tablet (0.4 mg total) under the tongue every 5 (five) minutes as needed for chest pain. 25 tablet 3  . pantoprazole (PROTONIX) 40 MG tablet TAKE 1 TABLET TWICE DAILY 180 tablet 1  . potassium chloride SA (K-DUR,KLOR-CON) 20 MEQ tablet TAKE 1 TABLET EVERY MORNING 90 tablet 1  . Probiotic Product ([place]) CAPS Take 1 capsule by mouth daily.    . simvastatin (ZOCOR) 10 MG tablet TAKE 1 TABLET EVERY DAY 90 tablet 1  . SYMBICORT 160-4.5 MCG/ACT inhaler INHALE 2 PUFFS TWICE DAILY 3 Inhaler 1  . TRUE METRIX BLOOD GLUCOSE TEST test strip TEST TWICE DAILY AS DIRECTED 200 each 2  . TRUEPLUS LANCETS 28G MISC TEST BLOOD SUGAR TWICE DAILY 200 each 2  . warfarin (COUMADIN) 2 MG tablet TAKE PER PHYSICIAN INSTRUCTIONS  135 tablet 1   No current facility-administered medications for this visit.     Allergies  Allergen Reactions  . Torsemide Nausea And Vomiting  . Macrobid [Nitrofurantoin Monohyd Macro] Rash  . Tramadol Nausea Only    Social History   Social History  . Marital status: Single    Spouse name: N/A  . Number of children: 2  . Years of education: N/A   Occupational History  . RETIRED Retired   Social History Main Topics  . Smoking status: Current Every  Day Smoker    Packs/day: 0.25    Years: 45.00    Types: Cigarettes    Start date: 02/05/1958  . Smokeless tobacco: Never Used     Comment: smokes 5-6 cigarettes daily  . Alcohol use No  . Drug use: No  . Sexual activity: Not Currently   Other Topics Concern  . Not on file   Social History Narrative  . No narrative on file    Family History  Problem Relation Age of Onset  . Cancer Mother     cervical  . Stroke Mother   . Cancer Daughter 34    kidney, lung  . Heart disease Father   . Heart attack Father   . Ulcers Father   . Colon cancer Neg  Hx   . Early death Sister 1    died at 25 months old  . Hypertension Brother   . Cancer Paternal Aunt     breast  . Hypertension Son     Physical Exam: Vitals:   04/08/16 1407  BP: (!) 102/52  Pulse: 65  SpO2: 99%  Weight: 136 lb (61.7 kg)  Height: 5\' 2"  (1.575 m)    GEN- The patient is elderly appearing, alert and oriented x 3 today.   Head- normocephalic, atraumatic Eyes-  Sclera clear, conjunctiva pink Ears- hearing intact Oropharynx- clear Neck- supple, no JVP Lungs- diffuse expiratory wheezing, normal work of breathing Chest- pacemaker pocket is well healed Heart- Regular rate and rhythm, 2/6 SEM LSB GI- soft, NT, ND, + BS Extremities- no clubbing, cyanosis, 1+edema with chronic venous stasis changes  Pacemaker interrogation- reviewed in detail today,  See PACEART report  Assessment and Plan:  1. Permanent afib  She is on coumadin and tolerating this well  chads2vasc score is at least 4   2. Tachy/brady syndrome Normal pacemaker function  See Pace Art report  No changes today   3. Chronic venous insufficiency  Improved   4. Tachy/brady syndrome  Stable No change required today  5. Tobacco  I have strongly encouraged smoking cessation   6. SOB myoview reviewed with patient.  No ischemia but EF low by myoview.  Last echo 2016 with normal EF at that time.  Will repeat echo.   Follow-up with Dr  Bronson Ing as scheduled  Merlin  Return in [place] MD, [facility] 04/08/2016 2:55 PM

## 2016-04-15 ENCOUNTER — Encounter: Payer: Self-pay | Admitting: Family Medicine

## 2016-04-15 ENCOUNTER — Ambulatory Visit (INDEPENDENT_AMBULATORY_CARE_PROVIDER_SITE_OTHER): Payer: Commercial Managed Care - HMO | Admitting: Family Medicine

## 2016-04-15 VITALS — BP 125/59 | HR 67 | Temp 97.6°F | Ht 62.0 in | Wt 138.4 lb

## 2016-04-15 DIAGNOSIS — I878 Other specified disorders of veins: Secondary | ICD-10-CM | POA: Diagnosis not present

## 2016-04-15 DIAGNOSIS — L03115 Cellulitis of right lower limb: Secondary | ICD-10-CM

## 2016-04-15 MED ORDER — AMOXICILLIN-POT CLAVULANATE 875-125 MG PO TABS: 1.0000 | ORAL_TABLET | Freq: Two times a day (BID) | ORAL | 0 refills | Status: DC

## 2016-04-15 NOTE — Progress Notes (Signed)
Subjective:  Patient ID: [name], female    DOB: 04-30-1935  Age: 81 y.o. MRN: [id number]  CC: Cellulitis (lower right leg cellulitis?, pt states it was draining last night but hasn't been draining any today.)   HPI [name] presents for Legs tend to swell a lot. She is tried compression hose in the past with inability to get them on. She is thinking about trying some that are less thick it might be easier to put on. Last night there were 2 places draining serous fluid. A little bit of blood this morning but overall no drainage now. They're not painful. They are moderately swollen. She does take her furosemide every morning. She still swells in spite of that. She denies any shortness breath currently. There is some redness on the right shin that is new.   History [name] has a past medical history of Abnormal CT of the chest; Anemia, unspecified; Body mass index (BMI) of 20.0-20.9 in adult (FEB 2013 147 LBS); CAD (coronary artery disease) (2011); Carotid artery disease ([place]); Cataract; Chronic airway obstruction, not elsewhere classified; Chronic kidney disease, stage II (mild); Congestive heart failure, unspecified; Diabetes mellitus; Dilated bile duct (10/19/2011); Diverticulitis (Dec 2012); Gastritis; GERD (gastroesophageal reflux disease); Hypercholesterolemia; Hypertension; Mitral regurgitation (06/27/2013); Neurogenic sleep apnea; Pacemaker; Permanent atrial fibrillation ([place]); Recurrent UTI; Rheumatic heart disease; Tachycardia-bradycardia ([place]); Unspecified hypertensive kidney disease with chronic kidney disease stage I through stage IV, or unspecified; Valvular heart disease; and Vitamin D deficiency.   She has a past surgical history that includes Single-chamber pacemaker implantation (3/12); Evacuation of retroperitoneal and left rectus sheath; Cholecystectomy; Hernia repair; Pilonidal cyst / sinus excision; Esophagogastroduodenoscopy (10/2011); Colonoscopy (10/2011); EUS  (11/16/11); and Hematoma evacuation.   Her family history includes Cancer in her mother and paternal aunt; Cancer (age of onset: 51) in her daughter; Early death (age of onset: 84) in her sister; Heart attack in her father; Heart disease in her father; Hypertension in her brother and son; Stroke in her mother; Ulcers in her father.She reports that she has been smoking Cigarettes.  She started smoking about 58 years ago. She has a 11.25 pack-year smoking history. She has never used smokeless tobacco. She reports that she does not drink alcohol or use drugs.    ROS Review of Systems  Constitutional: Negative for activity change, appetite change and fever.  HENT: Negative for sore throat.   Eyes: Negative for visual disturbance.  Respiratory: Negative for shortness of breath.   Cardiovascular: Positive for leg swelling. Negative for chest pain.  Gastrointestinal: Negative for abdominal pain, diarrhea and nausea.  Genitourinary: Negative for dysuria.  Musculoskeletal: Negative for arthralgias and myalgias.    Objective:  BP (!) 125/59   Pulse 67   Temp 97.6 F (36.4 C) (Oral)   Ht 5\' 2"  (1.575 m)   Wt 138 lb 6 oz (62.8 kg)   SpO2 100%   BMI 25.31 kg/m   BP Readings from Last 3 Encounters:  04/15/16 (!) 125/59  04/08/16 (!) 102/52  03/16/16 124/72    Wt Readings from Last 3 Encounters:  04/15/16 138 lb 6 oz (62.8 kg)  04/08/16 136 lb (61.7 kg)  03/16/16 134 lb (60.8 kg)     Physical Exam  Constitutional: She is oriented to person, place, and time. She appears well-developed and well-nourished. No distress.  HENT:  Head: Normocephalic and atraumatic.  Eyes: Conjunctivae are normal. Pupils are equal, round, and reactive to light.  Neck: Normal range of motion. Neck supple. No thyromegaly  present.  Cardiovascular: Normal rate, regular rhythm and normal heart sounds.   No murmur heard. Pulmonary/Chest: Effort normal and breath sounds normal. No respiratory distress. She has no  wheezes. She has no rales.  Abdominal: Soft. There is no tenderness.  Musculoskeletal: Normal range of motion. She exhibits edema (2+ pretibial edema bilaterally) and tenderness.  Lymphadenopathy:    She has no cervical adenopathy.  Neurological: She is alert and oriented to person, place, and time.  Skin: Skin is warm and dry.  Psychiatric: She has a normal mood and affect. Her behavior is normal. Judgment and thought content normal.     Lab Results  Component Value Date   WBC 9.0 11/25/2015   HGB 11.3 (L) 11/25/2015   HCT 34.3 (L) 11/25/2015   PLT 196 11/25/2015   GLUCOSE 170 (H) 12/05/2015   CHOL 112 10/04/2015   TRIG 68 10/04/2015   HDL 34 (L) 10/04/2015   LDLCALC 64 10/04/2015   ALT 12 10/04/2015   AST 21 10/04/2015   NA 134 12/05/2015   K 3.8 12/05/2015   CL 93 (L) 12/05/2015   CREATININE 1.36 (H) 12/05/2015   BUN 21 12/05/2015   CO2 21 12/05/2015   TSH 3.150 11/01/2015   INR 2.6 (H) 03/19/2016   HGBA1C 5.7 10/04/2015    Nm Myocar Multi W/spect W/wall Motion / Ef  Result Date: 03/24/2016  There was no ST segment deviation noted during stress.  The left ventricular ejection fraction is moderately decreased (30-44%).  This is an intermediate risk study. Risk is based on decreased LVEF, there is no current myocardium at jeopardy. Consider correlating LVEF with echo.  There are no perfusion defects consistent with prior infarct or current ischemia     Assessment & Plan:   [name] was seen today for cellulitis.  Diagnoses and all orders for this visit:  Venous stasis of lower extremity  Cellulitis of right lower extremity  Other orders -     amoxicillin-clavulanate (AUGMENTIN) 875-125 MG tablet; Take 1 tablet by mouth 2 (two) times daily. Take all of this medication    Increase lasix to BID X 1 week  I am having [name] start on amoxicillin-clavulanate. I am also having her maintain her [place], nitroGLYCERIN, TRUE METRIX LEVEL 1, TRUE  METRIX AIR GLUCOSE METER, pantoprazole, potassium chloride SA, ferrous sulfate, acetaminophen, albuterol, calcium-vitamin D, furosemide, TRUEPLUS LANCETS 28G, TRUE METRIX BLOOD GLUCOSE TEST, denosumab, diltiazem, clonazePAM, HYDROcodone-acetaminophen, simvastatin, SYMBICORT, HYDROcodone-acetaminophen, ipratropium-albuterol, lipase/protease/amylase, warfarin, and JANUVIA.  Meds ordered this encounter  Medications  . amoxicillin-clavulanate (AUGMENTIN) 875-125 MG tablet    Sig: Take 1 tablet by mouth 2 (two) times daily. Take all of this medication    Dispense:  20 tablet    Refill:  0     Follow-up: Return in about 2 weeks (around 04/29/2016) for with MMM for edema.  Claretta Fraise, M.D.

## 2016-04-22 ENCOUNTER — Encounter: Payer: Self-pay | Admitting: Nurse Practitioner

## 2016-04-22 ENCOUNTER — Ambulatory Visit (INDEPENDENT_AMBULATORY_CARE_PROVIDER_SITE_OTHER): Payer: Commercial Managed Care - HMO | Admitting: Nurse Practitioner

## 2016-04-22 VITALS — BP 111/58 | HR 74 | Temp 97.0°F | Ht 62.0 in | Wt 138.0 lb

## 2016-04-22 DIAGNOSIS — I5033 Acute on chronic diastolic (congestive) heart failure: Secondary | ICD-10-CM | POA: Diagnosis not present

## 2016-04-22 DIAGNOSIS — F419 Anxiety disorder, unspecified: Secondary | ICD-10-CM

## 2016-04-22 DIAGNOSIS — R296 Repeated falls: Secondary | ICD-10-CM | POA: Diagnosis not present

## 2016-04-22 DIAGNOSIS — I482 Chronic atrial fibrillation: Secondary | ICD-10-CM | POA: Diagnosis not present

## 2016-04-22 DIAGNOSIS — I4821 Permanent atrial fibrillation: Secondary | ICD-10-CM

## 2016-04-22 DIAGNOSIS — E1310 Other specified diabetes mellitus with ketoacidosis without coma: Secondary | ICD-10-CM

## 2016-04-22 DIAGNOSIS — I10 Essential (primary) hypertension: Secondary | ICD-10-CM

## 2016-04-22 DIAGNOSIS — E1110 Type 2 diabetes mellitus with ketoacidosis without coma: Secondary | ICD-10-CM

## 2016-04-22 DIAGNOSIS — Z720 Tobacco use: Secondary | ICD-10-CM

## 2016-04-22 DIAGNOSIS — Z7901 Long term (current) use of anticoagulants: Secondary | ICD-10-CM

## 2016-04-22 DIAGNOSIS — Z9181 History of falling: Secondary | ICD-10-CM

## 2016-04-22 DIAGNOSIS — J449 Chronic obstructive pulmonary disease, unspecified: Secondary | ICD-10-CM | POA: Diagnosis not present

## 2016-04-22 DIAGNOSIS — K219 Gastro-esophageal reflux disease without esophagitis: Secondary | ICD-10-CM

## 2016-04-22 DIAGNOSIS — E1121 Type 2 diabetes mellitus with diabetic nephropathy: Secondary | ICD-10-CM

## 2016-04-22 DIAGNOSIS — Z950 Presence of cardiac pacemaker: Secondary | ICD-10-CM

## 2016-04-22 DIAGNOSIS — E785 Hyperlipidemia, unspecified: Secondary | ICD-10-CM | POA: Diagnosis not present

## 2016-04-22 LAB — BAYER DCA HB A1C WAIVED: HB A1C: 5.8 % (ref <7.0)

## 2016-04-22 LAB — COAGUCHEK XS/INR WAIVED
INR: 2.5 — ABNORMAL HIGH (ref 0.9–1.1)
PROTHROMBIN TIME: 29.6 s

## 2016-04-22 MED ORDER — PANTOPRAZOLE SODIUM 40 MG PO TBEC: 40.0000 mg | DELAYED_RELEASE_TABLET | Freq: Two times a day (BID) | ORAL | 1 refills | Status: DC

## 2016-04-22 MED ORDER — CLONAZEPAM 0.5 MG PO TABS: 0.5000 mg | ORAL_TABLET | Freq: Three times a day (TID) | ORAL | 1 refills | Status: DC | PRN

## 2016-04-22 MED ORDER — POTASSIUM CHLORIDE CRYS ER 20 MEQ PO TBCR: 20.0000 meq | EXTENDED_RELEASE_TABLET | Freq: Every morning | ORAL | 1 refills | Status: DC

## 2016-04-22 NOTE — Patient Instructions (Signed)
Fall Prevention in the Home  Falls can cause injuries and can affect people from all age groups. There are many simple things that you can do to make your home safe and to help prevent falls. WHAT CAN I DO ON THE OUTSIDE OF MY HOME?  Regularly repair the edges of walkways and driveways and fix any cracks.  Remove high doorway thresholds.  Trim any shrubbery on the main path into your home.  Use bright outdoor lighting.  Clear walkways of debris and clutter, including tools and rocks.  Regularly check that handrails are securely fastened and in good repair. Both sides of any steps should have handrails.  Install guardrails along the edges of any raised decks or porches.  Have leaves, snow, and ice cleared regularly.  Use sand or salt on walkways during winter months.  In the garage, clean up any spills right away, including grease or oil spills. WHAT CAN I DO IN THE BATHROOM?  Use night lights.  Install grab bars by the toilet and in the tub and shower. Do not use towel bars as grab bars.  Use non-skid mats or decals on the floor of the tub or shower.  If you need to sit down while you are in the shower, use a plastic, non-slip stool..  Keep the floor dry. Immediately clean up any water that spills on the floor.  Remove soap buildup in the tub or shower on a regular basis.  Attach bath mats securely with double-sided non-slip rug tape.  Remove throw rugs and other tripping hazards from the floor. WHAT CAN I DO IN THE BEDROOM?  Use night lights.  Make sure that a bedside light is easy to reach.  Do not use oversized bedding that drapes onto the floor.  Have a firm chair that has side arms to use for getting dressed.  Remove throw rugs and other tripping hazards from the floor. WHAT CAN I DO IN THE KITCHEN?   Clean up any spills right away.  Avoid walking on wet floors.  Place frequently used items in easy-to-reach places.  If you need to reach for something  above you, use a sturdy step stool that has a grab bar.  Keep electrical cables out of the way.  Do not use floor polish or wax that makes floors slippery. If you have to use wax, make sure that it is non-skid floor wax.  Remove throw rugs and other tripping hazards from the floor. WHAT CAN I DO IN THE STAIRWAYS?  Do not leave any items on the stairs.  Make sure that there are handrails on both sides of the stairs. Fix handrails that are broken or loose. Make sure that handrails are as long as the stairways.  Check any carpeting to make sure that it is firmly attached to the stairs. Fix any carpet that is loose or worn.  Avoid having throw rugs at the top or bottom of stairways, or secure the rugs with carpet tape to prevent them from moving.  Make sure that you have a light switch at the top of the stairs and the bottom of the stairs. If you do not have them, have them installed. WHAT ARE SOME OTHER FALL PREVENTION TIPS?  Wear closed-toe shoes that fit well and support your feet. Wear shoes that have rubber soles or low heels.  When you use a stepladder, make sure that it is completely opened and that the sides are firmly locked. Have someone hold the ladder while you   are using it. Do not climb a closed stepladder.  Add color or contrast paint or tape to grab bars and handrails in your home. Place contrasting color strips on the first and last steps.  Use mobility aids as needed, such as canes, walkers, scooters, and crutches.  Turn on lights if it is dark. Replace any light bulbs that burn out.  Set up furniture so that there are clear paths. Keep the furniture in the same spot.  Fix any uneven floor surfaces.  Choose a carpet design that does not hide the edge of steps of a stairway.  Be aware of any and all pets.  Review your medicines with your healthcare provider. Some medicines can cause dizziness or changes in blood pressure, which increase your risk of falling. Talk  with your health care provider about other ways that you can decrease your risk of falls. This may include working with a physical therapist or trainer to improve your strength, balance, and endurance.   This information is not intended to replace advice given to you by your health care provider. Make sure you discuss any questions you have with your health care provider.   Document Released: 07/17/2002 Document Revised: 12/11/2014 Document Reviewed: 08/31/2014 Elsevier Interactive Patient Education 2016 Elsevier Inc.  

## 2016-04-22 NOTE — Progress Notes (Signed)
Subjective:    Patient ID: [name], female    DOB: 1935-02-18, 81 y.o.   MRN: [id number]  HPI  Patient here today for follow up of chronic medical problems. She has lots of medical problems and is on a lot of meds. She has been doing well the last couple of months. SHe goes to pain mangament for her chronic pains. He has scheduled her for ct SCAN OF LEFT SHOULDER DUE TO INCREASING PAIN. SHe is a diabetic and her blood sugars fasting are around 120=140. SHe does not check them everyday. SHe tries to watch diet. Very little exercise. SHe saw heart doctor several months ago and had a stress test and now tey want o do U/S of heart on October 4th. SHe has no complaints to day other then her chronic pain.  * See INR documentation  Outpatient Encounter Prescriptions as of 04/22/2016  Medication Sig  . acetaminophen (TYLENOL) 500 MG tablet Take 1,000 mg by mouth every 6 (six) hours as needed.  [name] albuterol (PROVENTIL HFA;VENTOLIN HFA) 108 (90 Base) MCG/ACT inhaler Inhale 2 puffs into the lungs every 6 (six) hours as needed for wheezing or shortness of breath.  [name] amoxicillin-clavulanate (AUGMENTIN) 875-125 MG tablet Take 1 tablet by mouth 2 (two) times daily. Take all of this medication  . Blood Glucose Calibration (TRUE METRIX LEVEL 1) LOW SOLN Use weekly  . Blood Glucose Monitoring Suppl (TRUE METRIX AIR GLUCOSE METER) DEVI 1 Device by Does not apply route 2 (two) times daily. Use to Test blood sugar bid. DX E13.10  . calcium-vitamin D 250-100 MG-UNIT tablet Take 2 tablets by mouth daily.  . clonazePAM (KLONOPIN) 0.5 MG tablet Take 1 tablet (0.5 mg total) by mouth 3 (three) times daily as needed for anxiety.  [name] denosumab (PROLIA) 60 MG/ML SOLN injection INJECT 60 MG SQ every 6 months.  . diltiazem (CARDIZEM) 120 MG tablet Take 1 tablet (120 mg total) by mouth daily.  . ferrous sulfate 325 (65 FE) MG tablet Take 1 tablet (325 mg total) by mouth daily with breakfast.  . furosemide (LASIX) 40 MG  tablet TAKE 1 TABLET EVERY DAY  . HYDROcodone-acetaminophen (NORCO) 7.5-325 MG tablet Take 1 tablet by mouth 3 (three) times daily as needed.  [name] HYDROcodone-acetaminophen (NORCO/VICODIN) 5-325 MG tablet   . ipratropium-albuterol (DUONEB) 0.5-2.5 (3) MG/3ML SOLN INHALE THE CONTENTS OF 1 VIAL VIA NEBULIZER EVERY 4 HOURS AS NEEDED  . JANUVIA 50 MG tablet TAKE 1 TABLET EVERY DAY  . lipase/protease/amylase (CREON) 36000 UNITS CPEP capsule TAKE 3 CAPSULES WITH MEALS  AND TAKE 1 CAPSULE WITH A SNACK AS DIRECTED  . nitroGLYCERIN (NITROSTAT) 0.4 MG SL tablet Place 1 tablet (0.4 mg total) under the tongue every 5 (five) minutes as needed for chest pain.  . pantoprazole (PROTONIX) 40 MG tablet TAKE 1 TABLET TWICE DAILY  . potassium chloride SA (K-DUR,KLOR-CON) 20 MEQ tablet TAKE 1 TABLET EVERY MORNING  . Probiotic Product ([place]) CAPS Take 1 capsule by mouth daily.  . simvastatin (ZOCOR) 10 MG tablet TAKE 1 TABLET EVERY DAY  . SYMBICORT 160-4.5 MCG/ACT inhaler INHALE 2 PUFFS TWICE DAILY  . TRUE METRIX BLOOD GLUCOSE TEST test strip TEST TWICE DAILY AS DIRECTED  . TRUEPLUS LANCETS 28G MISC TEST BLOOD SUGAR TWICE DAILY  . warfarin (COUMADIN) 2 MG tablet TAKE PER PHYSICIAN INSTRUCTIONS    No facility-administered encounter medications on file as of 04/22/2016.    Patient Active Problem List   Diagnosis Date Noted  .  At high risk for falls 02/13/2016  . Chronic kidney disease (CKD), stage IV (severe) ([place]) 07/02/2015  . Nocturnal hypoxemia 04/04/2015  . Hyperlipidemia with target LDL less than 100 12/31/2014  . Osteoporosis, post-menopausal 11/01/2013  . GERD (gastroesophageal reflux disease) 07/13/2013  . Diarrhea 07/13/2013  . Aortic stenosis 06/27/2013  . Acute on chronic diastolic CHF (congestive heart failure) ([place]) 06/27/2013  . Vitamin D deficiency   . Tobacco abuse 05/12/2012  . Abdominal wall hernia 10/19/2011  . CAD (coronary artery disease)   . Pacemaker-St.Jude   .  Chronic anticoagulation 12/03/2010  . Anxiety 12/03/2010  . Mitral stenosis 05/30/2010  . Anemia 05/16/2010  . Diabetes ([place]) 01/19/2009  . Essential hypertension 01/19/2009  . Permanent atrial fibrillation ([place]) 01/19/2009  . COPD GOLD O  01/19/2009  . HEART MURMUR, BENIGN 01/19/2009       Review of Systems  Constitutional: Negative.   HENT: Negative.   Respiratory: Negative.   Cardiovascular: Negative.   Genitourinary: Negative.   Neurological: Negative.   Psychiatric/Behavioral: Negative.   All other systems reviewed and are negative.      Objective:   Physical Exam  Constitutional: She is oriented to person, place, and time. She appears well-developed and well-nourished.  HENT:  Right Ear: External ear normal.  Left Ear: External ear normal.  Nose: Nose normal.  Mouth/Throat: Oropharynx is clear and moist.  Eyes: Pupils are equal, round, and reactive to light.  Neck: Normal range of motion. Neck supple.  Cardiovascular: Normal rate and intact distal pulses.   Murmur (2/6 systoic) heard. Pulmonary/Chest: Effort normal and breath sounds normal. No respiratory distress. She has no wheezes. She has no rales.  Abdominal: Soft. Bowel sounds are normal.  Lymphadenopathy:    She has no cervical adenopathy.  Neurological: She is alert and oriented to person, place, and time.  Skin: Skin is warm and dry.  Circulatory skin changes to bil lower ext  Psychiatric: She has a normal mood and affect. Her behavior is normal. Judgment and thought content normal.   BP (!) 111/58   Pulse 74   Temp 97 F (36.1 C) (Oral)   Ht '5\' 2"'$  (1.575 m)   Wt 138 lb (62.6 kg)   BMI 25.24 kg/m    hgba1c 5.8%     Assessment & Plan:   1. Essential hypertension   2. Uncontrolled type 2 diabetes mellitus with ketoacidosis without coma, without long-term current use of insulin (HCC)   3. Permanent atrial fibrillation ([place])   4. Acute on chronic diastolic CHF (congestive heart failure)  ([place])   5. COPD GOLD O    6. Gastroesophageal reflux disease without esophagitis   7. Type 2 diabetes mellitus with diabetic nephropathy, without long-term current use of insulin ([place])   8. Anxiety   9. At high risk for falls   10. Chronic anticoagulation   11. Hyperlipidemia with target LDL less than 100   12. Pacemaker-St.Jude   13. Tobacco abuse    Meds ordered this encounter  Medications  . clonazePAM (KLONOPIN) 0.5 MG tablet    Sig: Take 1 tablet (0.5 mg total) by mouth 3 (three) times daily as needed for anxiety.    Dispense:  90 tablet    Refill:  1    Order Specific Question:   Supervising Provider    Answer:   VINCENT, CAROL L [[id number]]  . potassium chloride SA (K-DUR,KLOR-CON) 20 MEQ tablet    Sig: Take 1 tablet (20 mEq total) by mouth every  morning.    Dispense:  90 tablet    Refill:  1    Order Specific Question:   Supervising Provider    Answer:   VINCENT, CAROL L [[id number]]  . pantoprazole (PROTONIX) 40 MG tablet    Sig: Take 1 tablet (40 mg total) by mouth 2 (two) times daily.    Dispense:  180 tablet    Refill:  1    Order Specific Question:   Supervising Provider    Answer:   Evette Doffing, CAROL L [[id number]]   Orders Placed This Encounter  Procedures  . Bayer DCA Hb A1c Waived  . CMP14+EGFR  . Lipid panel    Encouraged to get eye exam as soon as can continue to watch carbs in diet continue current doe of coumadin- recheck in 4 weeks Fall precautions discussed Labs pending Rest RTO in 3 months  [place], FNP

## 2016-04-23 LAB — CMP14+EGFR
ALK PHOS: 70 IU/L (ref 39–117)
ALT: 7 IU/L (ref 0–32)
AST: 15 IU/L (ref 0–40)
Albumin/Globulin Ratio: 0.9 — ABNORMAL LOW (ref 1.2–2.2)
Albumin: 3.4 g/dL — ABNORMAL LOW (ref 3.5–4.8)
BUN/Creatinine Ratio: 12 (ref 12–28)
BUN: 18 mg/dL (ref 8–27)
Bilirubin Total: 0.4 mg/dL (ref 0.0–1.2)
CHLORIDE: 99 mmol/L (ref 96–106)
CO2: 18 mmol/L (ref 18–29)
CREATININE: 1.52 mg/dL — AB (ref 0.57–1.00)
Calcium: 8.2 mg/dL — ABNORMAL LOW (ref 8.7–10.3)
GFR calc Af Amer: 37 mL/min/{1.73_m2} — ABNORMAL LOW (ref >59)
GFR calc non Af Amer: 32 mL/min/{1.73_m2} — ABNORMAL LOW (ref >59)
GLOBULIN, TOTAL: 3.9 g/dL (ref 1.5–4.5)
GLUCOSE: 110 mg/dL — AB (ref 65–99)
Potassium: 4.4 mmol/L (ref 3.5–5.2)
SODIUM: 134 mmol/L (ref 134–144)
Total Protein: 7.3 g/dL (ref 6.0–8.5)

## 2016-04-23 LAB — LIPID PANEL
CHOLESTEROL TOTAL: 101 mg/dL (ref 100–199)
Chol/HDL Ratio: 4.6 ratio units — ABNORMAL HIGH (ref 0.0–4.4)
HDL: 22 mg/dL — ABNORMAL LOW (ref >39)
LDL CALC: 59 mg/dL (ref 0–99)
TRIGLYCERIDES: 99 mg/dL (ref 0–149)
VLDL Cholesterol Cal: 20 mg/dL (ref 5–40)

## 2016-04-28 DIAGNOSIS — M19012 Primary osteoarthritis, left shoulder: Secondary | ICD-10-CM | POA: Diagnosis not present

## 2016-04-28 DIAGNOSIS — I700 Atherosclerosis of aorta: Secondary | ICD-10-CM | POA: Diagnosis not present

## 2016-04-28 DIAGNOSIS — J439 Emphysema, unspecified: Secondary | ICD-10-CM | POA: Diagnosis not present

## 2016-04-29 ENCOUNTER — Ambulatory Visit (INDEPENDENT_AMBULATORY_CARE_PROVIDER_SITE_OTHER): Payer: Commercial Managed Care - HMO

## 2016-04-29 ENCOUNTER — Other Ambulatory Visit: Payer: Self-pay

## 2016-04-29 DIAGNOSIS — R0602 Shortness of breath: Secondary | ICD-10-CM | POA: Diagnosis not present

## 2016-04-30 ENCOUNTER — Telehealth: Payer: Self-pay | Admitting: *Deleted

## 2016-04-30 NOTE — Telephone Encounter (Signed)
Notes Recorded by Laurine Blazer, LPN on 1/94/7125 at 2:71 PM EDT Patient notified and verbalized understanding. Copy to pmd. Follow up scheduled for 05/12/2016 with Dr. Bronson Ing. ------  Notes Recorded by Herminio Commons, MD on 04/29/2016 at 4:04 PM EDT Normal pumping function, mild calcium buildup on aortic valve with mild narrowing.

## 2016-05-07 DIAGNOSIS — B351 Tinea unguium: Secondary | ICD-10-CM | POA: Diagnosis not present

## 2016-05-07 DIAGNOSIS — L84 Corns and callosities: Secondary | ICD-10-CM | POA: Diagnosis not present

## 2016-05-07 DIAGNOSIS — E1151 Type 2 diabetes mellitus with diabetic peripheral angiopathy without gangrene: Secondary | ICD-10-CM | POA: Diagnosis not present

## 2016-05-08 ENCOUNTER — Encounter: Payer: Commercial Managed Care - HMO | Admitting: Internal Medicine

## 2016-05-09 DIAGNOSIS — J449 Chronic obstructive pulmonary disease, unspecified: Secondary | ICD-10-CM | POA: Diagnosis not present

## 2016-05-09 DIAGNOSIS — J188 Other pneumonia, unspecified organism: Secondary | ICD-10-CM | POA: Diagnosis not present

## 2016-05-12 ENCOUNTER — Ambulatory Visit (INDEPENDENT_AMBULATORY_CARE_PROVIDER_SITE_OTHER): Payer: Commercial Managed Care - HMO | Admitting: Cardiovascular Disease

## 2016-05-12 ENCOUNTER — Encounter: Payer: Self-pay | Admitting: *Deleted

## 2016-05-12 VITALS — BP 102/60 | HR 68 | Ht 62.0 in | Wt 133.0 lb

## 2016-05-12 DIAGNOSIS — I482 Chronic atrial fibrillation: Secondary | ICD-10-CM | POA: Diagnosis not present

## 2016-05-12 DIAGNOSIS — I4821 Permanent atrial fibrillation: Secondary | ICD-10-CM

## 2016-05-12 DIAGNOSIS — R5383 Other fatigue: Secondary | ICD-10-CM

## 2016-05-12 DIAGNOSIS — I5032 Chronic diastolic (congestive) heart failure: Secondary | ICD-10-CM | POA: Diagnosis not present

## 2016-05-12 DIAGNOSIS — Z950 Presence of cardiac pacemaker: Secondary | ICD-10-CM

## 2016-05-12 DIAGNOSIS — I10 Essential (primary) hypertension: Secondary | ICD-10-CM

## 2016-05-12 DIAGNOSIS — I350 Nonrheumatic aortic (valve) stenosis: Secondary | ICD-10-CM

## 2016-05-12 DIAGNOSIS — I779 Disorder of arteries and arterioles, unspecified: Secondary | ICD-10-CM

## 2016-05-12 DIAGNOSIS — I739 Peripheral vascular disease, unspecified: Secondary | ICD-10-CM

## 2016-05-12 DIAGNOSIS — I495 Sick sinus syndrome: Secondary | ICD-10-CM

## 2016-05-12 DIAGNOSIS — Z720 Tobacco use: Secondary | ICD-10-CM

## 2016-05-12 NOTE — Progress Notes (Signed)
SUBJECTIVE: The patient is here for routine cardiovascular follow up. She has COPD, chronic diastolic heart failure, permanent atrial fibrillation/flutter and has a pacemaker.   She was previoulsy taken off of metoprolol by Dr. Rayann Heman given her significant COPD with wheezing. She has not tolerated higher doses of diltiazem in the past as it led to low blood pressures.  She also has a h/o mild nonobstructive CAD and a previous tachycardia-induced cardiomyopathy which has since normalized in function.   Coronary angiography on 04/04/2010 demonstrated a 40% proximal LAD lesion, 20% mid left circumflex coronary artery lesion, and the RCA had a 20% proximal, 50-60% mid vessel, and distal 30% stenosis.  Currently denies chest pain. No change in baseline exertional dyspnea.  Echocardiogram 04/29/16 normal left ventricular systolic function, EF 79-39%, severe left atrial enlargement, mild mitral regurgitation, mild calcific aortic stenosis, mean gradient 11 mmHg, mildly dilated right ventricle with mildly reduced contraction, mild tricuspid regurgitation, PA S/P 42 mmHg, and moderate right atrial enlargement.  Nuclear stress test 03/24/16 showed no perfusion defects consistent with prior infarct or recurrent ischemia.    Review of Systems: As per "subjective", otherwise negative.  Allergies  Allergen Reactions  . Torsemide Nausea And Vomiting  . Macrobid [Nitrofurantoin Monohyd Macro] Rash  . Tramadol Nausea Only    Current Outpatient Prescriptions  Medication Sig Dispense Refill  . acetaminophen (TYLENOL) 500 MG tablet Take 1,000 mg by mouth every 6 (six) hours as needed.    [name] albuterol (PROVENTIL HFA;VENTOLIN HFA) 108 (90 Base) MCG/ACT inhaler Inhale 2 puffs into the lungs every 6 (six) hours as needed for wheezing or shortness of breath. 1 Inhaler 2  . Blood Glucose Calibration (TRUE METRIX LEVEL 1) LOW SOLN Use weekly 3 each 1  . Blood Glucose Monitoring Suppl (TRUE METRIX AIR  GLUCOSE METER) DEVI 1 Device by Does not apply route 2 (two) times daily. Use to Test blood sugar bid. DX E13.10 1 Device 0  . calcium-vitamin D 250-100 MG-UNIT tablet Take 2 tablets by mouth daily.    . clonazePAM (KLONOPIN) 0.5 MG tablet Take 1 tablet (0.5 mg total) by mouth 3 (three) times daily as needed for anxiety. 90 tablet 1  . denosumab (PROLIA) 60 MG/ML SOLN injection INJECT 60 MG SQ every 6 months. 1 mL 0  . diltiazem (CARDIZEM) 120 MG tablet Take 1 tablet (120 mg total) by mouth daily. 90 tablet 1  . ferrous sulfate 325 (65 FE) MG tablet Take 1 tablet (325 mg total) by mouth daily with breakfast.  3  . furosemide (LASIX) 40 MG tablet TAKE 1 TABLET EVERY DAY 90 tablet 1  . HYDROcodone-acetaminophen (NORCO) 7.5-325 MG tablet Take 1 tablet by mouth 3 (three) times daily as needed.    [name] HYDROcodone-acetaminophen (NORCO/VICODIN) 5-325 MG tablet     . ipratropium-albuterol (DUONEB) 0.5-2.5 (3) MG/3ML SOLN INHALE THE CONTENTS OF 1 VIAL VIA NEBULIZER EVERY 4 HOURS AS NEEDED 360 mL 1  . JANUVIA 50 MG tablet TAKE 1 TABLET EVERY DAY 90 tablet 1  . lipase/protease/amylase (CREON) 36000 UNITS CPEP capsule TAKE 3 CAPSULES WITH MEALS  AND TAKE 1 CAPSULE WITH A SNACK AS DIRECTED 1000 capsule 0  . nitroGLYCERIN (NITROSTAT) 0.4 MG SL tablet Place 1 tablet (0.4 mg total) under the tongue every 5 (five) minutes as needed for chest pain. 25 tablet 3  . pantoprazole (PROTONIX) 40 MG tablet Take 1 tablet (40 mg total) by mouth 2 (two) times daily. 180 tablet 1  . potassium  chloride SA (K-DUR,KLOR-CON) 20 MEQ tablet Take 1 tablet (20 mEq total) by mouth every morning. 90 tablet 1  . Probiotic Product ([place]) CAPS Take 1 capsule by mouth daily.    . simvastatin (ZOCOR) 10 MG tablet TAKE 1 TABLET EVERY DAY 90 tablet 1  . SYMBICORT 160-4.5 MCG/ACT inhaler INHALE 2 PUFFS TWICE DAILY 3 Inhaler 1  . TRUE METRIX BLOOD GLUCOSE TEST test strip TEST TWICE DAILY AS DIRECTED 200 each 2  . TRUEPLUS LANCETS  28G MISC TEST BLOOD SUGAR TWICE DAILY 200 each 2  . warfarin (COUMADIN) 2 MG tablet TAKE PER PHYSICIAN INSTRUCTIONS  135 tablet 1   No current facility-administered medications for this visit.     Past Medical History:  Diagnosis Date  . Abnormal CT of the chest    Mediastinal and hilar adenopathy followed by Dr. Koleen Nimrod in [place]  . Anemia, unspecified   . Body mass index (BMI) of 20.0-20.9 in adult FEB 2013 147 LBS  . CAD (coronary artery disease) 2011   Catheterization August 2011: mild nonobstructive coronary artery disease complicated by large left rectus sheath hematoma status post evacuation Coumadin restarted without recurrence  . Carotid artery disease ([place])    Doppler, 05/2012, 0-39% bilateral  . Cataract   . Chronic airway obstruction, not elsewhere classified   . Chronic kidney disease, stage II (mild)   . Congestive heart failure, unspecified    EF now 60%, previous nonischemic cardiomyopathy  . Diabetes mellitus   . Dilated bile duct 10/19/2011  . Diverticulitis Dec 2012  . Gastritis    By EGD  . GERD (gastroesophageal reflux disease)   . Hypercholesterolemia   . Hypertension   . Mitral regurgitation 06/27/2013  . Neurogenic sleep apnea    Uses noctural oxygen prescribed by her pulmonologi  . Pacemaker    Single chamber [place]. Jude ACCENT [phone] SN 719 04/[birth year]/1959 10/31/2010  . Permanent atrial fibrillation (HCC)    H/o tachybradycardia syndrome on Coumadin anticoagulation, has pacemaker.  . Recurrent UTI   . Rheumatic heart disease   . Tachycardia-bradycardia [facility])    s/p St. Jude single chamber PPM 10/2010   . Unspecified hypertensive kidney disease with chronic kidney disease stage I through stage IV, or unspecified(403.90)   . Valvular heart disease    a. H/o moderate mitral stenosis by cath 2011 but no mention of this on 10/2011 echo. b. 10/2011 echo: mod MR, mild AS, mild TR; EF>65%  . Vitamin D deficiency     Past Surgical History:  Procedure Laterality  Date  . CHOLECYSTECTOMY     40+ years ago  . COLONOSCOPY  10/2011   Dyann Ruddle sigmoid to descending diverticulosis, internal hemorrhoids  . ESOPHAGOGASTRODUODENOSCOPY  10/2011   Fields-erosive gastritis, biopsy with no H. pylori, hiatal hernia, peptic duodenitis, no celiac disease, duodenal diverticulum, duodenal nodule  . EUS  11/16/11   Mishra-13 MM CBD, normal pancreatic duct, otherwise normal EUS  . Evacuation of retroperitoneal and left rectus sheath    . HEMATOMA EVACUATION     femoral  . HERNIA REPAIR     X3  . PILONIDAL CYST / SINUS EXCISION    . Single-chamber pacemaker implantation  3/12   by Dr Caryl Comes    Social History   Social History  . Marital status: Single    Spouse name: N/A  . Number of children: 2  . Years of education: N/A   Occupational History  . RETIRED Retired   Social History Main Topics  .  Smoking status: Current Every Day Smoker    Packs/day: 0.25    Years: 45.00    Types: Cigarettes    Start date: 02/05/1958  . Smokeless tobacco: Never Used     Comment: smokes 5-6 cigarettes daily  . Alcohol use No  . Drug use: No  . Sexual activity: Not Currently   Other Topics Concern  . Not on file   Social History Narrative  . No narrative on file     Vitals:   05/12/16 1354  BP: 102/60  Pulse: 68  SpO2: 98%  Weight: 133 lb (60.3 kg)  Height: 5\' 2"  (1.575 m)    PHYSICAL EXAM General: NAD HEENT: Poor dentition.  Neck: No JVD, no thyromegaly. Lungs: Prolonged expiratory phase with end-expiratory wheezes, no rales, poor air entry. CV: Nondisplaced PMI. Regular rate and rhythm, normal S1/S2, no S3/S4, II/VI ejection systolic murmur heard at RUSB and LUSB. No pretibial or periankle edema. Chronic stasis dermatitis. Abdomen: Soft, nontender, no distention.  Neurologic: Alert and oriented.  Psych: Normal affect.   ECG: Most recent ECG reviewed.      ASSESSMENT AND PLAN: 1. Permanent atrial fibrillation s/p pacemaker: Continue  long-acting diltiazem 120 mg. I am not inclined to reintroduce metoprolol in the future should palpitations recur given her severe COPD. Continue warfarin. No bleeding complications noted.   2. Valvular heart disease: Mild aortic stenosis and mitral and tricuspid regurgitation as noted above. Will monitor.  3. Carotid artery stenosis: Mild bilaterally in October 2013.   4. Chronic diastolic heart failure: Euvolemic. No changes to therapy.  5. Tobacco abuse: Cessation counseling previously provided.  6. Type II diabetes: On Januvia.   7. COPD: Not decompensated at present.  8. CAD: RCA lesion was 50-60% in 2011. Dobutamine Myoview stress test showed no ischemia or scar. Continue SL nitroglycerin to be used prn. Continue statin.  Dispo: f/u 1 yr   Kate Sable, M.D., F.A.C.C.

## 2016-05-12 NOTE — Patient Instructions (Signed)

## 2016-05-15 ENCOUNTER — Encounter: Payer: Commercial Managed Care - HMO | Admitting: Internal Medicine

## 2016-05-18 DIAGNOSIS — M47817 Spondylosis without myelopathy or radiculopathy, lumbosacral region: Secondary | ICD-10-CM | POA: Diagnosis not present

## 2016-05-18 DIAGNOSIS — M25512 Pain in left shoulder: Secondary | ICD-10-CM | POA: Diagnosis not present

## 2016-05-18 DIAGNOSIS — M5136 Other intervertebral disc degeneration, lumbar region: Secondary | ICD-10-CM | POA: Diagnosis not present

## 2016-05-18 DIAGNOSIS — Z79891 Long term (current) use of opiate analgesic: Secondary | ICD-10-CM | POA: Diagnosis not present

## 2016-05-18 DIAGNOSIS — G894 Chronic pain syndrome: Secondary | ICD-10-CM | POA: Diagnosis not present

## 2016-05-19 ENCOUNTER — Other Ambulatory Visit: Payer: Self-pay | Admitting: Nurse Practitioner

## 2016-05-27 ENCOUNTER — Encounter: Payer: Self-pay | Admitting: Pharmacist

## 2016-05-27 ENCOUNTER — Other Ambulatory Visit: Payer: Self-pay | Admitting: Nurse Practitioner

## 2016-06-01 ENCOUNTER — Telehealth: Payer: Self-pay | Admitting: Pharmacist

## 2016-06-01 ENCOUNTER — Ambulatory Visit (INDEPENDENT_AMBULATORY_CARE_PROVIDER_SITE_OTHER): Payer: Commercial Managed Care - HMO | Admitting: Pharmacist

## 2016-06-01 DIAGNOSIS — I482 Chronic atrial fibrillation: Secondary | ICD-10-CM | POA: Diagnosis not present

## 2016-06-01 DIAGNOSIS — Z23 Encounter for immunization: Secondary | ICD-10-CM

## 2016-06-01 DIAGNOSIS — I4821 Permanent atrial fibrillation: Secondary | ICD-10-CM

## 2016-06-01 DIAGNOSIS — J411 Mucopurulent chronic bronchitis: Secondary | ICD-10-CM

## 2016-06-01 LAB — COAGUCHEK XS/INR WAIVED
INR: 2.1 — ABNORMAL HIGH (ref 0.9–1.1)
Prothrombin Time: 25.6 s

## 2016-06-01 NOTE — Telephone Encounter (Signed)
I thought she had seen dr. Mammie Russian before. She does have COPD and has had for awhile.

## 2016-06-02 NOTE — Telephone Encounter (Signed)
lmtcb jkp 10/24

## 2016-06-08 DIAGNOSIS — J188 Other pneumonia, unspecified organism: Secondary | ICD-10-CM | POA: Diagnosis not present

## 2016-06-08 DIAGNOSIS — J449 Chronic obstructive pulmonary disease, unspecified: Secondary | ICD-10-CM | POA: Diagnosis not present

## 2016-06-10 NOTE — Telephone Encounter (Signed)
Patient needs referral for pulmonologist. Patient states that she has not seen Dr. Dickie La in [place] that she has seen Dr. Sabra Heck in [place] over a year and half ago but doctor Hension is no longer at that office and patient is requesting a new referral. Please advise and send back to the pools.

## 2016-06-11 NOTE — Telephone Encounter (Signed)
Referral made 

## 2016-06-15 ENCOUNTER — Other Ambulatory Visit: Payer: Self-pay | Admitting: Cardiovascular Disease

## 2016-06-16 DIAGNOSIS — M47817 Spondylosis without myelopathy or radiculopathy, lumbosacral region: Secondary | ICD-10-CM | POA: Diagnosis not present

## 2016-06-16 DIAGNOSIS — G894 Chronic pain syndrome: Secondary | ICD-10-CM | POA: Diagnosis not present

## 2016-06-16 DIAGNOSIS — Z79891 Long term (current) use of opiate analgesic: Secondary | ICD-10-CM | POA: Diagnosis not present

## 2016-06-16 DIAGNOSIS — M5136 Other intervertebral disc degeneration, lumbar region: Secondary | ICD-10-CM | POA: Diagnosis not present

## 2016-06-16 DIAGNOSIS — M25512 Pain in left shoulder: Secondary | ICD-10-CM | POA: Diagnosis not present

## 2016-06-18 ENCOUNTER — Ambulatory Visit (INDEPENDENT_AMBULATORY_CARE_PROVIDER_SITE_OTHER): Payer: Commercial Managed Care - HMO | Admitting: Internal Medicine

## 2016-06-18 ENCOUNTER — Encounter: Payer: Self-pay | Admitting: Internal Medicine

## 2016-06-18 ENCOUNTER — Ambulatory Visit (INDEPENDENT_AMBULATORY_CARE_PROVIDER_SITE_OTHER)
Admission: RE | Admit: 2016-06-18 | Discharge: 2016-06-18 | Disposition: A | Discharge status: Home / Self Care | Payer: Commercial Managed Care - HMO | Source: Ambulatory Visit | Attending: Internal Medicine | Admitting: Internal Medicine

## 2016-06-18 VITALS — BP 118/74 | HR 72 | Ht 62.0 in | Wt 136.0 lb

## 2016-06-18 DIAGNOSIS — F17210 Nicotine dependence, cigarettes, uncomplicated: Secondary | ICD-10-CM | POA: Diagnosis not present

## 2016-06-18 DIAGNOSIS — R05 Cough: Secondary | ICD-10-CM | POA: Diagnosis not present

## 2016-06-18 DIAGNOSIS — J449 Chronic obstructive pulmonary disease, unspecified: Secondary | ICD-10-CM

## 2016-06-18 MED ORDER — AZITHROMYCIN 250 MG PO TABS: ORAL_TABLET | ORAL | 0 refills | Status: DC

## 2016-06-18 MED ORDER — PREDNISONE 10 MG PO TABS: ORAL_TABLET | ORAL | 0 refills | Status: DC

## 2016-06-18 NOTE — Progress Notes (Signed)
Subjective:     Patient ID: [name], female   DOB: 1935/07/02 MRN: [id number]    Brief patient profile:  65 yowf active smoker with variable sob x around 2012 eval by Koleen Nimrod dx copd referred by [name] for cough > sob with GOLD 0 COPD  01/21/15    History of Present Illness  06/18/2016  Consultation /Heinrich Fertig  COPD GOLD 0 still smoking  Chief Complaint  Patient presents with  . Pulmonary Consult    Referred by Dr. Dina Rich for bronchitis. The pt states that she has been coughing with grey sputum for the past wk. She states that her breathing has been worse for the past month. She feels SOB first thing in the am. She is using duoneb 2-3 x per day and does not currently have a rescue inhaler.   very confused with details of care/ how/when to use inhalers vs nebs  No obvious day to day or daytime variabilty or assoc   cp or chest tightness, subjective wheeze overt sinus or hb symptoms. No unusual exp hx or h/o childhood pna/ asthma or knowledge of premature birth.  Sleeping ok without nocturnal  or early am exacerbation  of respiratory  c/o's or need for noct saba. Also denies any obvious fluctuation of symptoms with weather or environmental changes or other aggravating or alleviating factors except as outlined above   Current Medications, Allergies, Complete Past Medical History, Past Surgical History, Family History, and Social History were reviewed in [employer] record.  ROS  The following are not active complaints unless bolded sore throat, dysphagia, dental problems, itching, sneezing,  nasal congestion or excess/ purulent secretions, ear ache,   fever, chills, sweats, unintended wt loss, pleuritic or exertional cp, hemoptysis,  orthopnea pnd or leg swelling, presyncope, palpitations, heartburn, abdominal pain, anorexia, nausea, vomiting, diarrhea  or change in bowel or urinary habits, change in stools or urine, dysuria,hematuria,   rash, arthralgias, visual complaints, headache, numbness weakness or ataxia or problems with walking or coordination,  change in mood/affect or memory.              Objective:   Physical Exam  endentulous wf nad    06/18/2016         01/21/15 147 lb (66.679 kg)  12/31/14 146 lb 12.8 oz (66.588 kg)  12/18/14 147 lb (66.679 kg)    Vital signs reviewed - - Note on arrival 02 sats  100% on RA       HEENT: nl , turbinates, and orophanx. Nl external ear canals without cough reflex   NECK :  without JVD/Nodes/TM/ nl carotid upstrokes bilaterally   LUNGS: no acc muscle use, insp and exp junky rhonchi    CV:  RRR  no s3 or murmur or increase in P2, no edema   ABD:  soft and nontender with nl excursion in the supine position. No bruits or organomegaly, bowel sounds nl  MS:  warm without deformities, calf tenderness, cyanosis or clubbing  SKIN: warm and dry without lesions    NEURO:  alert, approp, no deficits     CXR PA and Lateral:   06/18/2016 :    I personally reviewed images and agree with radiology impression as follows:    Chronic bronchitic changes, stable. No acute pneumonia, CHF, nor other acute cardiopulmonary abnormality.   Assessment:

## 2016-06-18 NOTE — Patient Instructions (Addendum)
Prednisone 10 mg take  4 each am x 2 days,   2 each am x 2 days,  1 each am x 2 days and stop   zpak  Please remember to go to the x-ray department downstairs for your tests - we will call you with the results when they are available.  The key is to stop smoking completely before smoking completely stops you!   Work on inhaler technique:  relax and gently blow all the way out then take a nice smooth deep breath back in, triggering the inhaler at same time you start breathing in.  Hold for up to 5 seconds if you can. Blow out thru nose. Rinse and gargle with water when done  GERD (REFLUX)  is an extremely common cause of respiratory symptoms just like yours , many times with no obvious heartburn at all.    It can be treated with medication, but also with lifestyle changes including elevation of the head of your bed (ideally with 6 inch  bed blocks),  Smoking cessation, avoidance of late meals, excessive alcohol, and avoid fatty foods, chocolate, peppermint, colas, red wine, and acidic juices such as orange juice.  NO MINT OR MENTHOL PRODUCTS SO NO COUGH DROPS  USE SUGARLESS CANDY INSTEAD (Jolley ranchers or Stover's or Life Savers) or even ice chips will also do - the key is to swallow to prevent all throat clearing. NO OIL BASED VITAMINS - use powdered substitutes.     If not happy with breathing/ coughing >  First see Tammy NP   with all your medications, even over the counter meds, separated in two separate bags, the ones you take no matter what vs the ones you stop once you feel better and take only as needed when you feel you need them.   Tammy  will generate for you a new user friendly medication calendar that will put [place] all on the same page re: your medication use.     Without this process, it simply isn't possible to assure that we are providing  your outpatient care  with  the attention to detail we feel you deserve.   If we cannot assure that you're getting that kind of care,  then we  cannot manage your problem effectively from this clinic.  Once you have seen Tammy and we are sure that we're all on the same page with your medication use she will arrange follow up with me.

## 2016-06-18 NOTE — Progress Notes (Signed)
lmtcb

## 2016-06-19 ENCOUNTER — Telehealth: Payer: Self-pay | Admitting: Internal Medicine

## 2016-06-19 NOTE — Telephone Encounter (Signed)
Lmtcb. Will await call back 

## 2016-06-21 NOTE — Assessment & Plan Note (Signed)
-   02 at hs since 2012 - still smoking - 11/20/2014 p extensive coaching HFA effectiveness =    0% -med calendar 12/18/2014  - PFTs 01/21/2015 wnl except dlco 39 correct to 54% - 01/21/2015  Walked RA x 1 lap @ 185 ft each stopped due to  Tired/ unsteady/ no sob or desat at nl pace  Despite baseline only GOLD 0  Copd her symptoms have been chronically very difficult to control  DDX of  difficult airways management almost all start with A and  include Adherence, Ace Inhibitors, Acid Reflux, Active Sinus Disease, Alpha 1 Antitripsin deficiency, Anxiety masquerading as Airways dz,  ABPA,  Allergy(esp in young), Aspiration (esp in elderly), Adverse effects of meds,  Active smokers, A bunch of PE's (a small clot burden can't cause this syndrome unless there is already severe underlying pulm or vascular dz with poor reserve) plus two Bs  = Bronchiectasis and Beta blocker use..and one C= CHF   Adherence is always the initial "prime suspect" and is a multilayered concern that requires a "trust but verify" approach in every patient - starting with knowing how to use medications, especially inhalers, correctly, keeping up with refills and understanding the fundamental difference between maintenance and prns vs those medications only taken for a very short course and then stopped and not refilled.  - 06/18/2016  After extensive coaching HFA effectiveness =    75% - will need med reconciliation if wants to resume pulmonary care thru this office  ? Acid (or non-acid) GERD > always difficult to exclude as up to 75% of pts in some series report no assoc GI/ Heartburn symptoms> rec max (24h)  acid suppression and diet restrictions/ reviewed and instructions given in writing.   Active smoking  (see separate a/p)   ? Active sinus > doubt > zpak should be fine  ? Allergy >  Prednisone 10 mg take  4 each am x 2 days,   2 each am x 2 days,  1 each am x 2 days and stop  ? Anxiety > usually at the bottom of this list of  usual suspects but should be much higher on this pt's based on H and P and note already on psychotropics .   I had an extended discussion with the patient reviewing all relevant studies completed to date and  lasting35 minutes of a 60  minute office  visit  re  non-specific but potentially very serious pulmonary symptoms of unknown etiology.  Each maintenance medication was reviewed in detail including most importantly the difference between maintenance and prns and under what circumstances the prns are to be triggered using an action plan format that is not reflected in the computer generated alphabetically organized AVS.    Please see instructions for details which were reviewed in writing and the patient given a copy highlighting the part that I personally wrote and discussed at today's ov.

## 2016-06-21 NOTE — Assessment & Plan Note (Signed)

## 2016-06-22 ENCOUNTER — Other Ambulatory Visit: Payer: Self-pay | Admitting: Nurse Practitioner

## 2016-06-22 NOTE — Telephone Encounter (Signed)
Patient returned call - she can be reached at [phone]

## 2016-06-22 NOTE — Telephone Encounter (Signed)
Notes Recorded by Tanda Rockers, MD on 06/18/2016 at 3:19 PM EST Call pt: Reviewed cxr and no acute change so no change in recommendations made at ov ---------------------------------------- Spoke with pt. She is aware of results. Nothing further was needed.

## 2016-06-30 ENCOUNTER — Other Ambulatory Visit: Payer: Self-pay | Admitting: *Deleted

## 2016-06-30 MED ORDER — CLONAZEPAM 0.5 MG PO TABS: 0.5000 mg | ORAL_TABLET | Freq: Three times a day (TID) | ORAL | 1 refills | Status: DC | PRN

## 2016-06-30 NOTE — Telephone Encounter (Signed)
Prescription called in to [facility]

## 2016-06-30 NOTE — Telephone Encounter (Signed)
Pt requesting refill on Klonopin 0.5mg , if approved please route to Nurse Pool B for nurse to call in.

## 2016-06-30 NOTE — Telephone Encounter (Signed)
Please call in klonopin with 1 refills 

## 2016-07-06 ENCOUNTER — Ambulatory Visit (INDEPENDENT_AMBULATORY_CARE_PROVIDER_SITE_OTHER): Payer: Commercial Managed Care - HMO | Admitting: Pharmacist

## 2016-07-06 DIAGNOSIS — I482 Chronic atrial fibrillation: Secondary | ICD-10-CM

## 2016-07-06 DIAGNOSIS — E1121 Type 2 diabetes mellitus with diabetic nephropathy: Secondary | ICD-10-CM

## 2016-07-06 DIAGNOSIS — I4821 Permanent atrial fibrillation: Secondary | ICD-10-CM

## 2016-07-06 LAB — COAGUCHEK XS/INR WAIVED
INR: 3.5 — AB (ref 0.9–1.1)
PROTHROMBIN TIME: 41.7 s

## 2016-07-06 MED ORDER — CEPHALEXIN 500 MG PO CAPS: 500.0000 mg | ORAL_CAPSULE | Freq: Three times a day (TID) | ORAL | 0 refills | Status: DC

## 2016-07-06 NOTE — Progress Notes (Signed)
Patient ID: [name], female   DOB: 26-Oct-1934, 81 y.o.   MRN: [id number]  Subjective:     Indication: atrial fibrillation Bleeding signs/symptoms: None Thromboembolic signs/symptoms: None  Missed Coumadin doses: None Medication changes: yes - ABX and prednisone about 2 weeks ago Dietary changes: no Bacterial/viral infection: patient reports that she hit her left legs on her walker about 2 weeks ago.  Looked at area.  Large u-shaped cut that is healing but remains about 1 inch long on each side.  Area around cut is erythematous and slightly warm  Discussed with her PCP and recommended ABX Other concerns: no  Objective:    INR Today: 3.5 Current dose: warfarin 1mg  daily except 2mg  on Thursdays.  Assessment:    Supratherapeutic INR for goal of 2-3 - likely elevated due to ABX and prednisone   Plan:    1. New dose: no warfarin for 1 days, then restarted usual warfarin dose    2. Next INR: 2 weeks   3.  Cephalexin 500mg  1 capsule tid for 7 days RTC in left leg not improved after 3-4 days

## 2016-07-07 LAB — MICROALBUMIN / CREATININE URINE RATIO
CREATININE, UR: 61.7 mg/dL
MICROALB/CREAT RATIO: 45.5 mg/g{creat} — AB (ref 0.0–30.0)
MICROALBUM., U, RANDOM: 28.1 ug/mL

## 2016-07-08 ENCOUNTER — Encounter: Payer: Commercial Managed Care - HMO | Admitting: *Deleted

## 2016-07-08 ENCOUNTER — Telehealth: Payer: Self-pay | Admitting: Cardiology

## 2016-07-08 NOTE — Telephone Encounter (Signed)
Spoke with pt and reminded pt of remote transmission that is due today. Pt verbalized understanding.   

## 2016-07-09 DIAGNOSIS — J188 Other pneumonia, unspecified organism: Secondary | ICD-10-CM | POA: Diagnosis not present

## 2016-07-09 DIAGNOSIS — J449 Chronic obstructive pulmonary disease, unspecified: Secondary | ICD-10-CM | POA: Diagnosis not present

## 2016-07-10 ENCOUNTER — Encounter: Payer: Self-pay | Admitting: Cardiology

## 2016-07-20 ENCOUNTER — Ambulatory Visit (INDEPENDENT_AMBULATORY_CARE_PROVIDER_SITE_OTHER): Payer: Commercial Managed Care - HMO | Admitting: Pharmacist

## 2016-07-20 VITALS — BP 116/60 | HR 62

## 2016-07-20 DIAGNOSIS — R809 Proteinuria, unspecified: Secondary | ICD-10-CM | POA: Insufficient documentation

## 2016-07-20 DIAGNOSIS — E1129 Type 2 diabetes mellitus with other diabetic kidney complication: Secondary | ICD-10-CM | POA: Insufficient documentation

## 2016-07-20 DIAGNOSIS — I4821 Permanent atrial fibrillation: Secondary | ICD-10-CM

## 2016-07-20 DIAGNOSIS — I482 Chronic atrial fibrillation: Secondary | ICD-10-CM

## 2016-07-20 LAB — COAGUCHEK XS/INR WAIVED
INR: 1.6 — AB (ref 0.9–1.1)
Prothrombin Time: 18.6 s

## 2016-07-20 MED ORDER — FUROSEMIDE 40 MG PO TABS: 20.0000 mg | ORAL_TABLET | Freq: Every day | ORAL | 1 refills | Status: DC

## 2016-07-20 NOTE — Patient Instructions (Signed)
Anticoagulation Dose Instructions as of 07/20/2016      Dorene Grebe Tue Wed Thu Fri Sat   New Dose 1 mg 1 mg 1 mg 1 mg 2 mg 1 mg 1 mg    Description   Take 1 and 1/2 tablets today and tomorrow.  Then restart usual dose of warfarin 2mg  - take 1/2 tablet daily except on Thursdays take 1 tablet. Continue with plan prior to steroid injection to hold warfarin for 5 days prior.   INR was 1.6 today (too thick)      Decrease furosemide to 40mg  - take 1/2 tablet (=20) once each morning.  If you have any swelling (especially in legs) or feel short of breath then go back to 40mg  daily. We will recheck your blood pressure at next appointment and see if can add medication that will help with kidneys and blood pressure.

## 2016-07-20 NOTE — Progress Notes (Signed)
Patient ID: [name], female   DOB: 1935/06/14, 81 y.o.   MRN: [id number]   Subjective:     Indication: atrial fibrillation Bleeding signs/symptoms: None Thromboembolic signs/symptoms: None  Missed Coumadin doses: This week - patient was scheduled for spinal steroid epidural 07/13/16 and she was holding for 5 days however after holding for 3 days she was contacted by Dr Isabelle Course office and injection had to be rescheduled.  She now is scheduled for steroid epidural for 07/27/2016.  She will begin holding warfarin 07/22/16 Medication changes: no Dietary changes: no Bacterial/viral infection: no Other concerns: yes - at last visit we checked patient's urine microalbumin.  It was slightly elevated and patient is not taking ACE or ARB.  The last 2- 3 BP readings have been a little low.    Current Outpatient Prescriptions:  .  acetaminophen (TYLENOL) 500 MG tablet, Take 1,000 mg by mouth every 6 (six) hours as needed., Disp: , Rfl:  .  albuterol (PROVENTIL HFA;VENTOLIN HFA) 108 (90 Base) MCG/ACT inhaler, Inhale 2 puffs into the lungs every 6 (six) hours as needed for wheezing or shortness of breath., Disp: 1 Inhaler, Rfl: 2 .  Blood Glucose Calibration (TRUE METRIX LEVEL 1) LOW SOLN, Use weekly, Disp: 3 each, Rfl: 1 .  Blood Glucose Monitoring Suppl (TRUE METRIX AIR GLUCOSE METER) DEVI, 1 Device by Does not apply route 2 (two) times daily. Use to Test blood sugar bid. DX E13.10, Disp: 1 Device, Rfl: 0 .  calcium-vitamin D 250-100 MG-UNIT tablet, Take 2 tablets by mouth daily., Disp: , Rfl:  .  cephALEXin (KEFLEX) 500 MG capsule, Take 1 capsule (500 mg total) by mouth 3 (three) times daily., Disp: 21 capsule, Rfl: 0 .  clonazePAM (KLONOPIN) 0.5 MG tablet, Take 1 tablet (0.5 mg total) by mouth 3 (three) times daily as needed for anxiety., Disp: 90 tablet, Rfl: 1 .  CREON 36000 units CPEP capsule, TAKE 3 CAPSULES WITH MEALS  AND TAKE 1 CAPSULE WITH A SNACK AS DIRECTED, Disp: 900 capsule, Rfl:  2 .  denosumab (PROLIA) 60 MG/ML SOLN injection, INJECT 60 MG SQ every 6 months., Disp: 1 mL, Rfl: 0 .  diltiazem (CARDIZEM) 120 MG tablet, TAKE 1 TABLET EVERY DAY, Disp: 90 tablet, Rfl: 3 .  ferrous sulfate 325 (65 FE) MG tablet, Take 1 tablet (325 mg total) by mouth daily with breakfast., Disp: , Rfl: 3 .  furosemide (LASIX) 40 MG tablet, Take 1 tablets (40 mg total) by mouth daily., Disp: 90 tablet, Rfl: 1 .  HYDROcodone-acetaminophen (NORCO) 7.5-325 MG tablet, Take 1 tablet by mouth 3 (three) times daily as needed., Disp: , Rfl:  .  ipratropium-albuterol (DUONEB) 0.5-2.5 (3) MG/3ML SOLN, INHALE THE CONTENTS OF 1 VIAL VIA NEBULIZER EVERY 4 HOURS AS NEEDED, Disp: 360 mL, Rfl: 1 .  JANUVIA 50 MG tablet, TAKE 1 TABLET EVERY DAY, Disp: 90 tablet, Rfl: 1 .  nitroGLYCERIN (NITROSTAT) 0.4 MG SL tablet, Place 1 tablet (0.4 mg total) under the tongue every 5 (five) minutes as needed for chest pain., Disp: 25 tablet, Rfl: 3 .  OXYGEN, 2lpm with sleep only, Disp: , Rfl:  .  pantoprazole (PROTONIX) 40 MG tablet, TAKE 1 TABLET TWICE DAILY, Disp: 180 tablet, Rfl: 1 .  potassium chloride SA (K-DUR,KLOR-CON) 20 MEQ tablet, Take 1 tablet (20 mEq total) by mouth every morning., Disp: 90 tablet, Rfl: 1 .  Probiotic Product (Balm) CAPS, Take 1 capsule by mouth daily., Disp: , Rfl:  .  simvastatin (ZOCOR)  10 MG tablet, TAKE 1 TABLET EVERY DAY, Disp: 90 tablet, Rfl: 1 .  SYMBICORT 160-4.5 MCG/ACT inhaler, INHALE 2 PUFFS TWICE DAILY, Disp: 3 Inhaler, Rfl: 1 .  TRUE METRIX BLOOD GLUCOSE TEST test strip, TEST TWICE DAILY AS DIRECTED, Disp: 200 each, Rfl: 2 .  TRUEPLUS LANCETS 28G MISC, TEST BLOOD SUGAR TWICE DAILY, Disp: 200 each, Rfl: 2 .  warfarin (COUMADIN) 2 MG tablet, TAKE PER PHYSICIAN INSTRUCTIONS , Disp: 135 tablet, Rfl: 1   Objective:    INR Today: 1.6 Current dose: warfarin 2mg  - 1 tablet on Thursdays, 1/2 tablet all other days.  Assessment:    Subtherapeutic INR due to holding prior  to anticipated procedure - goal of 2-3   Urine microalbuminuria -  Low BP  Plan:    1. Continue current warfarin dose and with plan to hold 5 days prior to epidural 2. Next INR: 2 weeks - seeing PCP 3.  Decrease furosemide to 20mg  daily as patient states she does not have problems with edema.  Recheck BP at next visit - might be able to then add ARB - such as valsartan 40mg  daily

## 2016-07-29 ENCOUNTER — Encounter (HOSPITAL_COMMUNITY): Payer: Self-pay | Admitting: Emergency Medicine

## 2016-07-29 ENCOUNTER — Emergency Department (HOSPITAL_COMMUNITY): Payer: Commercial Managed Care - HMO

## 2016-07-29 ENCOUNTER — Inpatient Hospital Stay (HOSPITAL_COMMUNITY)
Admission: EM | Admit: 2016-07-29 | Discharge: 2016-08-02 | DRG: 194 | Disposition: A | Discharge status: Home Health | Payer: Commercial Managed Care - HMO | Attending: Internal Medicine | Admitting: Internal Medicine

## 2016-07-29 DIAGNOSIS — I2510 Atherosclerotic heart disease of native coronary artery without angina pectoris: Secondary | ICD-10-CM | POA: Diagnosis present

## 2016-07-29 DIAGNOSIS — Z888 Allergy status to other drugs, medicaments and biological substances status: Secondary | ICD-10-CM | POA: Diagnosis not present

## 2016-07-29 DIAGNOSIS — J181 Lobar pneumonia, unspecified organism: Secondary | ICD-10-CM

## 2016-07-29 DIAGNOSIS — K219 Gastro-esophageal reflux disease without esophagitis: Secondary | ICD-10-CM | POA: Diagnosis present

## 2016-07-29 DIAGNOSIS — E1121 Type 2 diabetes mellitus with diabetic nephropathy: Secondary | ICD-10-CM | POA: Diagnosis not present

## 2016-07-29 DIAGNOSIS — J9611 Chronic respiratory failure with hypoxia: Secondary | ICD-10-CM | POA: Diagnosis present

## 2016-07-29 DIAGNOSIS — J440 Chronic obstructive pulmonary disease with acute lower respiratory infection: Secondary | ICD-10-CM | POA: Diagnosis not present

## 2016-07-29 DIAGNOSIS — E1122 Type 2 diabetes mellitus with diabetic chronic kidney disease: Secondary | ICD-10-CM | POA: Diagnosis present

## 2016-07-29 DIAGNOSIS — J189 Pneumonia, unspecified organism: Secondary | ICD-10-CM

## 2016-07-29 DIAGNOSIS — R0902 Hypoxemia: Secondary | ICD-10-CM | POA: Diagnosis present

## 2016-07-29 DIAGNOSIS — G4739 Other sleep apnea: Secondary | ICD-10-CM | POA: Diagnosis present

## 2016-07-29 DIAGNOSIS — J441 Chronic obstructive pulmonary disease with (acute) exacerbation: Secondary | ICD-10-CM | POA: Diagnosis not present

## 2016-07-29 DIAGNOSIS — I10 Essential (primary) hypertension: Secondary | ICD-10-CM | POA: Diagnosis present

## 2016-07-29 DIAGNOSIS — N182 Chronic kidney disease, stage 2 (mild): Secondary | ICD-10-CM | POA: Diagnosis present

## 2016-07-29 DIAGNOSIS — Z881 Allergy status to other antibiotic agents status: Secondary | ICD-10-CM

## 2016-07-29 DIAGNOSIS — Z79899 Other long term (current) drug therapy: Secondary | ICD-10-CM

## 2016-07-29 DIAGNOSIS — E7800 Pure hypercholesterolemia, unspecified: Secondary | ICD-10-CM | POA: Diagnosis not present

## 2016-07-29 DIAGNOSIS — Z7984 Long term (current) use of oral hypoglycemic drugs: Secondary | ICD-10-CM

## 2016-07-29 DIAGNOSIS — J159 Unspecified bacterial pneumonia: Secondary | ICD-10-CM | POA: Diagnosis not present

## 2016-07-29 DIAGNOSIS — Z7901 Long term (current) use of anticoagulants: Secondary | ICD-10-CM

## 2016-07-29 DIAGNOSIS — R0602 Shortness of breath: Secondary | ICD-10-CM | POA: Diagnosis not present

## 2016-07-29 DIAGNOSIS — F17210 Nicotine dependence, cigarettes, uncomplicated: Secondary | ICD-10-CM | POA: Diagnosis present

## 2016-07-29 DIAGNOSIS — E1129 Type 2 diabetes mellitus with other diabetic kidney complication: Secondary | ICD-10-CM | POA: Diagnosis present

## 2016-07-29 DIAGNOSIS — R05 Cough: Secondary | ICD-10-CM | POA: Diagnosis not present

## 2016-07-29 DIAGNOSIS — I482 Chronic atrial fibrillation: Secondary | ICD-10-CM | POA: Diagnosis present

## 2016-07-29 DIAGNOSIS — Z950 Presence of cardiac pacemaker: Secondary | ICD-10-CM

## 2016-07-29 LAB — COMPREHENSIVE METABOLIC PANEL
ALBUMIN: 3.3 g/dL — AB (ref 3.5–5.0)
ALK PHOS: 66 U/L (ref 38–126)
ALT: 11 U/L — AB (ref 14–54)
AST: 28 U/L (ref 15–41)
Anion gap: 8 (ref 5–15)
BILIRUBIN TOTAL: 0.6 mg/dL (ref 0.3–1.2)
BUN: 21 mg/dL — AB (ref 6–20)
CO2: 25 mmol/L (ref 22–32)
CREATININE: 1.6 mg/dL — AB (ref 0.44–1.00)
Calcium: 9.3 mg/dL (ref 8.9–10.3)
Chloride: 103 mmol/L (ref 101–111)
GFR calc Af Amer: 34 mL/min — ABNORMAL LOW (ref >60)
GFR, EST NON AFRICAN AMERICAN: 30 mL/min — AB (ref >60)
GLUCOSE: 142 mg/dL — AB (ref 65–99)
Potassium: 3.2 mmol/L — ABNORMAL LOW (ref 3.5–5.1)
Sodium: 136 mmol/L (ref 135–145)
TOTAL PROTEIN: 7.4 g/dL (ref 6.5–8.1)

## 2016-07-29 LAB — CBC WITH DIFFERENTIAL/PLATELET
BASOS ABS: 0 10*3/uL (ref 0.0–0.1)
BASOS PCT: 0 %
Eosinophils Absolute: 0 10*3/uL (ref 0.0–0.7)
Eosinophils Relative: 0 %
HEMATOCRIT: 28.9 % — AB (ref 36.0–46.0)
HEMOGLOBIN: 9.3 g/dL — AB (ref 12.0–15.0)
LYMPHS PCT: 14 %
Lymphs Abs: 0.9 10*3/uL (ref 0.7–4.0)
MCH: 30.3 pg (ref 26.0–34.0)
MCHC: 32.2 g/dL (ref 30.0–36.0)
MCV: 94.1 fL (ref 78.0–100.0)
MONO ABS: 0.3 10*3/uL (ref 0.1–1.0)
Monocytes Relative: 5 %
NEUTROS ABS: 4.9 10*3/uL (ref 1.7–7.7)
NEUTROS PCT: 81 %
Platelets: 171 10*3/uL (ref 150–400)
RBC: 3.07 MIL/uL — AB (ref 3.87–5.11)
RDW: 15.9 % — ABNORMAL HIGH (ref 11.5–15.5)
WBC: 6.1 10*3/uL (ref 4.0–10.5)

## 2016-07-29 LAB — BRAIN NATRIURETIC PEPTIDE: B Natriuretic Peptide: 526 pg/mL — ABNORMAL HIGH (ref 0.0–100.0)

## 2016-07-29 LAB — TROPONIN I: Troponin I: 0.03 ng/mL (ref <0.03)

## 2016-07-29 LAB — LACTIC ACID, PLASMA: LACTIC ACID, VENOUS: 1.6 mmol/L (ref 0.5–1.9)

## 2016-07-29 MED ORDER — DEXTROSE 5 % IV SOLN: 1.0000 g | Freq: Once | INTRAVENOUS | Status: DC

## 2016-07-29 MED ORDER — AZITHROMYCIN 250 MG PO TABS
500.0000 mg | ORAL_TABLET | Freq: Once | ORAL | Status: AC
  Administered 2016-07-29: 500 mg via ORAL
  Filled 2016-07-29: qty 2

## 2016-07-29 MED ORDER — ALBUTEROL SULFATE (2.5 MG/3ML) 0.083% IN NEBU
5.0000 mg | INHALATION_SOLUTION | Freq: Once | RESPIRATORY_TRACT | Status: AC
  Administered 2016-07-29: 5 mg via RESPIRATORY_TRACT
  Filled 2016-07-29: qty 6

## 2016-07-29 MED ORDER — DOXYCYCLINE HYCLATE 100 MG PO TABS: 100.0000 mg | ORAL_TABLET | Freq: Once | ORAL | Status: DC

## 2016-07-29 MED ORDER — METHYLPREDNISOLONE SODIUM SUCC 125 MG IJ SOLR
125.0000 mg | Freq: Once | INTRAMUSCULAR | Status: AC
  Administered 2016-07-29: 125 mg via INTRAVENOUS
  Filled 2016-07-29: qty 2

## 2016-07-29 MED ORDER — DEXTROSE 5 % IV SOLN
1.0000 g | Freq: Once | INTRAVENOUS | Status: AC
  Administered 2016-07-29: 1 g via INTRAVENOUS
  Filled 2016-07-29: qty 10

## 2016-07-29 MED ORDER — IPRATROPIUM-ALBUTEROL 0.5-2.5 (3) MG/3ML IN SOLN
3.0000 mL | Freq: Once | RESPIRATORY_TRACT | Status: AC
  Administered 2016-07-29: 3 mL via RESPIRATORY_TRACT
  Filled 2016-07-29: qty 3

## 2016-07-29 NOTE — ED Notes (Signed)
CRITICAL VALUE ALERT  Critical value received:  Troponin  Date of notification:  07/29/16  Time of notification:  2146 hrs  Critical value read back:Yes.    Nurse who received alert:  Y. Chidera Dearcos, RN  Responding MD:  Dr. Sabra Heck  Time MD responded:  2147 hrs

## 2016-07-29 NOTE — ED Provider Notes (Signed)
The patient is a 81 year old female with a known history of COPD as well as a history of CHF, presents to the hospital with increased shortness of breath over the last 3 days with increased coughing, subjective fever, no swelling of the legs. On exam the patient has diffuse expiratory wheezing in all lung fields with rhonchi of frequent coughing. She is requiring a small mount of supple metal oxygen due to hypoxia. X-ray shows possible left lower lobe infiltrate, no leukocytosis, given albuterol, slight Medrol, antibiotics, admitted to the hospitalist for ongoing COPD exacerbation.   EKG Interpretation  Date/Time:  Wednesday July 29 2016 20:17:05 EST Ventricular Rate:  88 PR Interval:    QRS Duration: 110 QT Interval:  376 QTC Calculation: 454 R Axis:   -84 Text Interpretation:  Undetermined rhythm Left axis deviation Right bundle branch block Anteroseptal infarct , age undetermined Abnormal ECG since last tracing no significant change Confirmed by Sabra Heck  MD, Bertin Inabinet ([id number]) on 07/29/2016 9:05:53 PM        Medical screening examination/treatment/procedure(s) were conducted as a shared visit with non-physician practitioner(s) and myself.  I personally evaluated the patient during the encounter.  Clinical Impression:   Final diagnoses:  COPD exacerbation ([place])  Community acquired pneumonia of left lower lobe of lung ([place])         Noemi Chapel, MD 07/30/16 2052

## 2016-07-29 NOTE — ED Triage Notes (Signed)
Pt reports SOB, chest pain and burning in her stomach. Pt on O2 at 1L.

## 2016-07-29 NOTE — H&P (Signed)
[name] is an 81 y.o. female.   Chief Complaint: shortness of breath HPI:  PRIMARY CARE PROVIDER: Dr. Rockne Coons  HPI: The patient is a 81 yo woman with COPD, on 2L [place] at home but only at night, who presents with shortness of breath. Onset: yesterday. Duration: constant.  Character: can't get a good breath Alleviated by: Nothing. Exacerbated by: exertion. Associated Symptoms: Coughing. Wheezing. Fever and chills. No chest pain. No other symptoms. Treatments: none at home except usual medications.    Past Medical History:  Diagnosis Date  . Abnormal CT of the chest    Mediastinal and hilar adenopathy followed by Dr. Koleen Nimrod in [place]  . Anemia, unspecified   . Body mass index (BMI) of 20.0-20.9 in adult FEB 2013 147 LBS  . CAD (coronary artery disease) 2011   Catheterization August 2011: mild nonobstructive coronary artery disease complicated by large left rectus sheath hematoma status post evacuation Coumadin restarted without recurrence  . Carotid artery disease ([place])    Doppler, 05/2012, 0-39% bilateral  . Cataract   . Chronic airway obstruction, not elsewhere classified   . Chronic kidney disease, stage II (mild)   . Congestive heart failure, unspecified    EF now 60%, previous nonischemic cardiomyopathy  . Diabetes mellitus   . Dilated bile duct 10/19/2011  . Diverticulitis Dec 2012  . Gastritis    By EGD  . GERD (gastroesophageal reflux disease)   . Hypercholesterolemia   . Hypertension   . Mitral regurgitation 06/27/2013  . Neurogenic sleep apnea    Uses noctural oxygen prescribed by her pulmonologi  . Pacemaker    Single chamber [place]. Jude ACCENT [phone] SN 719 04/11/1959 10/31/2010  . Permanent atrial fibrillation (HCC)    H/o tachybradycardia syndrome on Coumadin anticoagulation, has pacemaker.  . Recurrent UTI   . Rheumatic heart disease   . Tachycardia-bradycardia [facility])    s/p St. Jude single chamber PPM 10/2010   . Unspecified hypertensive kidney  disease with chronic kidney disease stage I through stage IV, or unspecified(403.90)   . Valvular heart disease    a. H/o moderate mitral stenosis by cath 2011 but no mention of this on 10/2011 echo. b. 10/2011 echo: mod MR, mild AS, mild TR; EF>65%  . Vitamin D deficiency     Past Surgical History:  Procedure Laterality Date  . CHOLECYSTECTOMY     40+ years ago  . COLONOSCOPY  10/2011   Dyann Ruddle sigmoid to descending diverticulosis, internal hemorrhoids  . ESOPHAGOGASTRODUODENOSCOPY  10/2011   Fields-erosive gastritis, biopsy with no H. pylori, hiatal hernia, peptic duodenitis, no celiac disease, duodenal diverticulum, duodenal nodule  . EUS  11/16/11   Mishra-13 MM CBD, normal pancreatic duct, otherwise normal EUS  . Evacuation of retroperitoneal and left rectus sheath    . HEMATOMA EVACUATION     femoral  . HERNIA REPAIR     X3  . PILONIDAL CYST / SINUS EXCISION    . Single-chamber pacemaker implantation  3/12   by Dr Caryl Comes    Family History  Problem Relation Age of Onset  . Cancer Mother     cervical  . Stroke Mother   . Cancer Daughter 8    kidney, lung  . Heart disease Father   . Heart attack Father   . Ulcers Father   . Early death Sister 1    died at 68 months old  . Hypertension Brother   . Cancer Paternal Aunt     breast  . Hypertension  Son   . Colon cancer Neg Hx    Social History:  reports that she has been smoking Cigarettes.  She started smoking about 58 years ago. She has a 11.25 pack-year smoking history. She has never used smokeless tobacco. She reports that she does not drink alcohol or use drugs.  Allergies:  Allergies  Allergen Reactions  . Torsemide Nausea And Vomiting  . Macrobid [Nitrofurantoin Monohyd Macro] Rash  . Tramadol Nausea Only   No current facility-administered medications on file prior to encounter.    Current Outpatient Prescriptions on File Prior to Encounter  Medication Sig Dispense Refill  . acetaminophen (TYLENOL) 500 MG  tablet Take 1,000 mg by mouth every 6 (six) hours as needed for mild pain, moderate pain or fever.     [name] albuterol (PROVENTIL HFA;VENTOLIN HFA) 108 (90 Base) MCG/ACT inhaler Inhale 2 puffs into the lungs every 6 (six) hours as needed for wheezing or shortness of breath. 1 Inhaler 2  . calcium-vitamin D 250-100 MG-UNIT tablet Take 2 tablets by mouth daily.    . clonazePAM (KLONOPIN) 0.5 MG tablet Take 1 tablet (0.5 mg total) by mouth 3 (three) times daily as needed for anxiety. 90 tablet 1  . CREON 36000 units CPEP capsule TAKE 3 CAPSULES WITH MEALS  AND TAKE 1 CAPSULE WITH A SNACK AS DIRECTED 900 capsule 2  . denosumab (PROLIA) 60 MG/ML SOLN injection INJECT 60 MG SQ every 6 months. 1 mL 0  . diltiazem (CARDIZEM) 120 MG tablet TAKE 1 TABLET EVERY DAY 90 tablet 3  . ferrous sulfate 325 (65 FE) MG tablet Take 1 tablet (325 mg total) by mouth daily with breakfast.  3  . furosemide (LASIX) 40 MG tablet Take 0.5-1 tablets (20-40 mg total) by mouth daily. (Patient taking differently: Take 20 mg by mouth daily. ) 90 tablet 1  . HYDROcodone-acetaminophen (NORCO) 7.5-325 MG tablet Take 1 tablet by mouth 3 (three) times daily as needed.    [name] ipratropium-albuterol (DUONEB) 0.5-2.5 (3) MG/3ML SOLN INHALE THE CONTENTS OF 1 VIAL VIA NEBULIZER EVERY 4 HOURS AS NEEDED 360 mL 1  . JANUVIA 50 MG tablet TAKE 1 TABLET EVERY DAY 90 tablet 1  . OXYGEN Inhale 2 L into the lungs at bedtime. 2lpm with sleep only     . pantoprazole (PROTONIX) 40 MG tablet TAKE 1 TABLET TWICE DAILY 180 tablet 1  . potassium chloride SA (K-DUR,KLOR-CON) 20 MEQ tablet Take 1 tablet (20 mEq total) by mouth every morning. 90 tablet 1  . Probiotic Product ([place]) CAPS Take 1 capsule by mouth daily.    . simvastatin (ZOCOR) 10 MG tablet TAKE 1 TABLET EVERY DAY 90 tablet 1  . SYMBICORT 160-4.5 MCG/ACT inhaler INHALE 2 PUFFS TWICE DAILY 3 Inhaler 1  . warfarin (COUMADIN) 2 MG tablet TAKE PER PHYSICIAN INSTRUCTIONS  (Patient taking  differently: TAKE 2MG ON THURSDAYS ONLY. TAKE 1MG ON ALL OTHER DAYS) 135 tablet 1  . Blood Glucose Calibration (TRUE METRIX LEVEL 1) LOW SOLN Use weekly 3 each 1  . Blood Glucose Monitoring Suppl (TRUE METRIX AIR GLUCOSE METER) DEVI 1 Device by Does not apply route 2 (two) times daily. Use to Test blood sugar bid. DX E13.10 1 Device 0  . cephALEXin (KEFLEX) 500 MG capsule Take 1 capsule (500 mg total) by mouth 3 (three) times daily. (Patient not taking: Reported on 07/29/2016) 21 capsule 0  . nitroGLYCERIN (NITROSTAT) 0.4 MG SL tablet Place 1 tablet (0.4 mg total) under the tongue every  5 (five) minutes as needed for chest pain. 25 tablet 3  . TRUE METRIX BLOOD GLUCOSE TEST test strip TEST TWICE DAILY AS DIRECTED 200 each 2  . TRUEPLUS LANCETS 28G MISC TEST BLOOD SUGAR TWICE DAILY 200 each 2     (Not in a hospital admission)  Results for orders placed or performed during the hospital encounter of 07/29/16 (from the past 48 hour(s))  CBC with Differential     Status: Abnormal   Collection Time: 07/29/16  8:30 PM  Result Value Ref Range   WBC 6.1 4.0 - 10.5 K/uL   RBC 3.07 (L) 3.87 - 5.11 MIL/uL   Hemoglobin 9.3 (L) 12.0 - 15.0 g/dL   HCT 28.9 (L) 36.0 - 46.0 %   MCV 94.1 78.0 - 100.0 fL   MCH 30.3 26.0 - 34.0 pg   MCHC 32.2 30.0 - 36.0 g/dL   RDW 15.9 (H) 11.5 - 15.5 %   Platelets 171 150 - 400 K/uL   Neutrophils Relative % 81 %   Neutro Abs 4.9 1.7 - 7.7 K/uL   Lymphocytes Relative 14 %   Lymphs Abs 0.9 0.7 - 4.0 K/uL   Monocytes Relative 5 %   Monocytes Absolute 0.3 0.1 - 1.0 K/uL   Eosinophils Relative 0 %   Eosinophils Absolute 0.0 0.0 - 0.7 K/uL   Basophils Relative 0 %   Basophils Absolute 0.0 0.0 - 0.1 K/uL  Comprehensive metabolic panel     Status: Abnormal   Collection Time: 07/29/16  8:30 PM  Result Value Ref Range   Sodium 136 135 - 145 mmol/L   Potassium 3.2 (L) 3.5 - 5.1 mmol/L   Chloride 103 101 - 111 mmol/L   CO2 25 22 - 32 mmol/L   Glucose, Bld 142 (H) 65 - 99  mg/dL   BUN 21 (H) 6 - 20 mg/dL   Creatinine, Ser 1.60 (H) 0.44 - 1.00 mg/dL   Calcium 9.3 8.9 - 10.3 mg/dL   Total Protein 7.4 6.5 - 8.1 g/dL   Albumin 3.3 (L) 3.5 - 5.0 g/dL   AST 28 15 - 41 U/L   ALT 11 (L) 14 - 54 U/L   Alkaline Phosphatase 66 38 - 126 U/L   Total Bilirubin 0.6 0.3 - 1.2 mg/dL   GFR calc non Af Amer 30 (L) >60 mL/min   GFR calc Af Amer 34 (L) >60 mL/min    Comment: (NOTE) The eGFR has been calculated using the CKD EPI equation. This calculation has not been validated in all clinical situations. eGFR's persistently <60 mL/min signify possible Chronic Kidney Disease.    Anion gap 8 5 - 15  Troponin I     Status: Abnormal   Collection Time: 07/29/16  8:30 PM  Result Value Ref Range   Troponin I 0.03 (HH) <0.03 ng/mL    Comment: CRITICAL RESULT CALLED TO, READ BACK BY AND VERIFIED WITH: WALL,YEVETTE AT 2145 ON 12.20.17 BY BAYSE,L   Lactic acid, plasma     Status: None   Collection Time: 07/29/16  8:36 PM  Result Value Ref Range   Lactic Acid, Venous 1.6 0.5 - 1.9 mmol/L  Brain natriuretic peptide     Status: Abnormal   Collection Time: 07/29/16  8:37 PM  Result Value Ref Range   B Natriuretic Peptide 526.0 (H) 0.0 - 100.0 pg/mL   Dg Chest 2 View  Result Date: 07/29/2016 CLINICAL DATA:  81 year old female with shortness of breath and productive cough EXAM: CHEST  2  VIEW COMPARISON:  Chest radiograph dated 06/18/2016 FINDINGS: Two views of the chest demonstrate an area of hazy density at the left lung base which may represent atelectatic changes but is concerning for developing pneumonia. Clinical correlation and follow-up to resolution is recommended. Trace left pleural effusion noted. The right lung is clear. There is no pneumothorax. Stable cardiac silhouette. Osteopenia. Stable benign-appearing chondroid lesion at the right humeral head. No acute fracture. IMPRESSION: Left lower lobe atelectasis versus infiltrate. Clinical correlation and follow-up  recommended. Probable small left pleural effusion. Electronically Signed   By: Anner Crete M.D.   On: 07/29/2016 21:42    ROS  ROS: GENERAL:  Fever, chills, and fatigue/malaise.  HEENT: No nasal discharge or bleeding. No throat pain or swelling. No eye pain or eye redness.  RESPIRATORY: Has cough, wheezing, shortness of breath.  CARDIOVASCULAR: No chest pain or palpitations.  GI: Mild nausea. No abdominal pain, vomiting, diarrhea, constipation, or bloody stool.  NEUROLOGICAL: No headache or focal weakness.  INTEGUMENT: no rashes, itching, or new lesions.  LYMPHATIC SYSTEM: no lymph node swelling or pain.  MUSCULOSKELETAL: no new joint pain or joint swelling.  GENITOURINARY: No dysuria or hematuria.  ENDOCRINE: No polyuria or polydipsia.  HEME: No chronic anemia, bleeding, or easy bruising.   Blood pressure 120/67, pulse 86, temperature 99 F (37.2 C), resp. rate 23, height 5' 2"  (1.575 m), weight 60.8 kg (134 lb), SpO2 99 %. Physical Exam   Physical Exam: Vitals noted. GENERAL: Ill-appearing, well nourished, well-developed.  HEENT: Normocephalic, atraumatic; pupils equal and round. Nares patent, without discharge or bleeding. No oropharyngeal lesions or erythema. Mucous membranes are dry.  NECK: is supple, no masses, trachea midline.  RESPIRATORY: Clear to auscultation bilaterally. Chest wall movements are symmetric. No use of accessory muscles to breathe. Intermittent tachypnea. Bilateral inspiratory and expiratory wheezing. No rales. Bibasilar coarse breath sounds/rhonchi. CARDIOVASCULAR: Normal S1, S2. No rubs, or gallops. PMI non-displaced. Carotids: no carotid bruits. No bradycardia or tachycardia. DP pulses 2+ bilaterally.  GI: soft, nontender, non-distended, normal active bowel sounds. No hepatosplenomegaly.  INTEGUMENT: Clean, dry, and intact. No rashes. MUSCULOSKELETAL: Moving all extremities. No cyanosis. No clubbing. Edema: none bilaterally.  NEUROLOGICAL: Cranial  nerves 2-12 grossly intact. Reflexes: 2+ bilaterally. Babinski: toes downgoing bilaterally.  Motor 4/5 throughout except LUE 3/5 (cannot move this much due to old shoulder injury). Sensory grossly intact to light touch.  No pronator drift on right; unable on left. PSYCHIATRIC: Fully oriented. Normal and appropriate affect.  LYMPHATIC: No cervical lymphadenopathy. No supraclavicular lymphadenopathy.   Assessment/Plan  Bacterial pneumonia Pneumonia. Severe. Requiring continuous oxygen support.  Type: Community acquired pneumonia.   Criteria for diagnosis: Chest x-ray suggestive of pneumonia, rhonchi on physical exam.  Likely bacterial.  Plan: Sputum culture has been ordered.  Treat with IV ceftriaxone and IV azithromycin.  Monitor oxygen saturation levels.    COPD exacerbation (HCC) COPD exacerbation, severe.  Plan: Nebs of Duoneb q 6 hours scheduled and albuterol q 2 hours prn.  IV methylprednisolone scheduled. Sputum culture ordered. IV ceftriaxone and IV azithromycin empirically in case of infection.  Continuous oxygen support.  Keep sats below 95% due to COPD.   Hypoxia Patient has dyspnea  But is improving. Plan:  Place patient on oxygen by [place] and increase as needed.  Monitor oxygen saturation levels.  Type 2 diabetes mellitus with kidney complication, without long-term current use of insulin (HCC) Plan: Hold oral diabetes medications.  Check fingerstick blood sugars q ac and hs.  Sliding scale insulin.  Chronic anticoagulation Continue warfarin and adjust. Daily INR.    Tacey Ruiz, MD 07/29/2016, 11:37 PM

## 2016-07-30 LAB — CBC
HCT: 27.5 % — ABNORMAL LOW (ref 36.0–46.0)
Hemoglobin: 8.9 g/dL — ABNORMAL LOW (ref 12.0–15.0)
MCH: 30.5 pg (ref 26.0–34.0)
MCHC: 32.4 g/dL (ref 30.0–36.0)
MCV: 94.2 fL (ref 78.0–100.0)
PLATELETS: 149 10*3/uL — AB (ref 150–400)
RBC: 2.92 MIL/uL — ABNORMAL LOW (ref 3.87–5.11)
RDW: 16 % — AB (ref 11.5–15.5)
WBC: 5 10*3/uL (ref 4.0–10.5)

## 2016-07-30 LAB — GLUCOSE, CAPILLARY
Glucose-Capillary: 184 mg/dL — ABNORMAL HIGH (ref 65–99)
Glucose-Capillary: 206 mg/dL — ABNORMAL HIGH (ref 65–99)
Glucose-Capillary: 151 mg/dL — ABNORMAL HIGH (ref 65–99)
Glucose-Capillary: 133 mg/dL — ABNORMAL HIGH (ref 65–99)

## 2016-07-30 LAB — BASIC METABOLIC PANEL
Anion gap: 9 (ref 5–15)
BUN: 19 mg/dL (ref 6–20)
CALCIUM: 9.1 mg/dL (ref 8.9–10.3)
CO2: 24 mmol/L (ref 22–32)
CREATININE: 1.49 mg/dL — AB (ref 0.44–1.00)
Chloride: 101 mmol/L (ref 101–111)
GFR, EST AFRICAN AMERICAN: 37 mL/min — AB (ref >60)
GFR, EST NON AFRICAN AMERICAN: 32 mL/min — AB (ref >60)
Glucose, Bld: 188 mg/dL — ABNORMAL HIGH (ref 65–99)
Potassium: 3.1 mmol/L — ABNORMAL LOW (ref 3.5–5.1)
SODIUM: 134 mmol/L — AB (ref 135–145)

## 2016-07-30 LAB — PROTIME-INR
INR: 1.2
PROTHROMBIN TIME: 15.3 s — AB (ref 11.4–15.2)

## 2016-07-30 MED ORDER — FLUTICASONE FUROATE-VILANTEROL 200-25 MCG/INH IN AEPB
1.0000 | INHALATION_SPRAY | Freq: Every day | RESPIRATORY_TRACT | Status: DC
  Administered 2016-07-30 – 2016-08-02 (×4): 1 via RESPIRATORY_TRACT
  Filled 2016-07-30: qty 28

## 2016-07-30 MED ORDER — SIMVASTATIN 10 MG PO TABS
10.0000 mg | ORAL_TABLET | Freq: Every day | ORAL | Status: DC
  Administered 2016-07-30 – 2016-08-02 (×4): 10 mg via ORAL
  Filled 2016-07-30 (×4): qty 1

## 2016-07-30 MED ORDER — WARFARIN SODIUM 2 MG PO TABS
2.0000 mg | ORAL_TABLET | Freq: Once | ORAL | Status: AC
  Administered 2016-07-30: 2 mg via ORAL
  Filled 2016-07-30: qty 1

## 2016-07-30 MED ORDER — ONDANSETRON HCL 4 MG PO TABS: 4.0000 mg | ORAL_TABLET | Freq: Four times a day (QID) | ORAL | Status: DC | PRN

## 2016-07-30 MED ORDER — GUAIFENESIN 100 MG/5ML PO SOLN
5.0000 mL | ORAL | Status: DC | PRN
  Administered 2016-07-30 – 2016-07-31 (×2): 100 mg via ORAL
  Filled 2016-07-30 (×2): qty 5

## 2016-07-30 MED ORDER — FERROUS SULFATE 325 (65 FE) MG PO TABS
325.0000 mg | ORAL_TABLET | Freq: Every day | ORAL | Status: DC
  Administered 2016-07-30 – 2016-08-02 (×4): 325 mg via ORAL
  Filled 2016-07-30 (×4): qty 1

## 2016-07-30 MED ORDER — INSULIN ASPART 100 UNIT/ML ~~LOC~~ SOLN
0.0000 [IU] | Freq: Three times a day (TID) | SUBCUTANEOUS | Status: DC
  Administered 2016-07-30: 5 [IU] via SUBCUTANEOUS
  Administered 2016-07-30 (×2): 3 [IU] via SUBCUTANEOUS
  Administered 2016-07-31: 2 [IU] via SUBCUTANEOUS
  Administered 2016-07-31: 5 [IU] via SUBCUTANEOUS
  Administered 2016-07-31 – 2016-08-01 (×2): 3 [IU] via SUBCUTANEOUS
  Administered 2016-08-01: 5 [IU] via SUBCUTANEOUS
  Administered 2016-08-01: 3 [IU] via SUBCUTANEOUS
  Administered 2016-08-02: 5 [IU] via SUBCUTANEOUS
  Administered 2016-08-02: 3 [IU] via SUBCUTANEOUS

## 2016-07-30 MED ORDER — METHYLPREDNISOLONE SODIUM SUCC 125 MG IJ SOLR
60.0000 mg | Freq: Four times a day (QID) | INTRAMUSCULAR | Status: DC
  Administered 2016-07-30 – 2016-08-02 (×14): 60 mg via INTRAVENOUS
  Filled 2016-07-30 (×14): qty 2

## 2016-07-30 MED ORDER — SENNOSIDES-DOCUSATE SODIUM 8.6-50 MG PO TABS: 1.0000 | ORAL_TABLET | Freq: Every evening | ORAL | Status: DC | PRN

## 2016-07-30 MED ORDER — CLONAZEPAM 0.5 MG PO TABS
0.5000 mg | ORAL_TABLET | Freq: Three times a day (TID) | ORAL | Status: DC | PRN
  Administered 2016-07-30 – 2016-07-31 (×2): 0.5 mg via ORAL
  Filled 2016-07-30 (×2): qty 1

## 2016-07-30 MED ORDER — PHILLIPS COLON HEALTH PO CAPS: 1.0000 | ORAL_CAPSULE | Freq: Every day | ORAL | Status: DC

## 2016-07-30 MED ORDER — ALBUTEROL SULFATE (2.5 MG/3ML) 0.083% IN NEBU: 2.5000 mg | INHALATION_SOLUTION | RESPIRATORY_TRACT | Status: DC | PRN

## 2016-07-30 MED ORDER — PANTOPRAZOLE SODIUM 40 MG PO TBEC
40.0000 mg | DELAYED_RELEASE_TABLET | Freq: Two times a day (BID) | ORAL | Status: DC
  Administered 2016-07-30 – 2016-08-02 (×7): 40 mg via ORAL
  Filled 2016-07-30 (×7): qty 1

## 2016-07-30 MED ORDER — PANCRELIPASE (LIP-PROT-AMYL) 12000-38000 UNITS PO CPEP
36000.0000 [IU] | ORAL_CAPSULE | Freq: Three times a day (TID) | ORAL | Status: DC
  Administered 2016-07-30 – 2016-08-02 (×11): 36000 [IU] via ORAL
  Filled 2016-07-30 (×11): qty 3

## 2016-07-30 MED ORDER — SACCHAROMYCES BOULARDII 250 MG PO CAPS
250.0000 mg | ORAL_CAPSULE | Freq: Two times a day (BID) | ORAL | Status: DC
  Administered 2016-07-30 – 2016-08-02 (×7): 250 mg via ORAL
  Filled 2016-07-30 (×8): qty 1

## 2016-07-30 MED ORDER — ONDANSETRON HCL 4 MG/2ML IJ SOLN: 4.0000 mg | Freq: Four times a day (QID) | INTRAMUSCULAR | Status: DC | PRN

## 2016-07-30 MED ORDER — HYDROCODONE-ACETAMINOPHEN 7.5-325 MG PO TABS
1.0000 | ORAL_TABLET | Freq: Three times a day (TID) | ORAL | Status: DC | PRN
  Administered 2016-07-30 – 2016-08-02 (×8): 1 via ORAL
  Filled 2016-07-30 (×8): qty 1

## 2016-07-30 MED ORDER — IPRATROPIUM-ALBUTEROL 0.5-2.5 (3) MG/3ML IN SOLN
3.0000 mL | Freq: Four times a day (QID) | RESPIRATORY_TRACT | Status: DC
  Administered 2016-07-30 – 2016-08-02 (×13): 3 mL via RESPIRATORY_TRACT
  Filled 2016-07-30 (×14): qty 3

## 2016-07-30 MED ORDER — DILTIAZEM HCL 60 MG PO TABS
120.0000 mg | ORAL_TABLET | Freq: Every day | ORAL | Status: DC
  Administered 2016-07-30 – 2016-08-02 (×4): 120 mg via ORAL
  Filled 2016-07-30 (×4): qty 2

## 2016-07-30 MED ORDER — SODIUM CHLORIDE 0.9 % IV SOLN: 250.0000 mL | INTRAVENOUS | Status: DC | PRN

## 2016-07-30 MED ORDER — DEXTROSE 5 % IV SOLN
500.0000 mg | INTRAVENOUS | Status: DC
  Administered 2016-07-30 – 2016-08-01 (×3): 500 mg via INTRAVENOUS
  Filled 2016-07-30 (×5): qty 500

## 2016-07-30 MED ORDER — SODIUM CHLORIDE 0.9% FLUSH
3.0000 mL | Freq: Two times a day (BID) | INTRAVENOUS | Status: DC
  Administered 2016-07-30 – 2016-08-02 (×6): 3 mL via INTRAVENOUS

## 2016-07-30 MED ORDER — SODIUM CHLORIDE 0.9% FLUSH: 3.0000 mL | INTRAVENOUS | Status: DC | PRN

## 2016-07-30 MED ORDER — ENSURE ENLIVE PO LIQD
237.0000 mL | Freq: Two times a day (BID) | ORAL | Status: DC
  Administered 2016-07-30 – 2016-08-01 (×4): 237 mL via ORAL

## 2016-07-30 MED ORDER — NITROGLYCERIN 0.4 MG SL SUBL: 0.4000 mg | SUBLINGUAL_TABLET | SUBLINGUAL | Status: DC | PRN

## 2016-07-30 MED ORDER — WARFARIN - PHARMACIST DOSING INPATIENT
Freq: Every day | Status: DC
  Administered 2016-07-30 – 2016-08-01 (×3)

## 2016-07-30 MED ORDER — DEXTROSE 5 % IV SOLN
1.0000 g | INTRAVENOUS | Status: DC
  Administered 2016-07-30 – 2016-08-01 (×3): 1 g via INTRAVENOUS
  Filled 2016-07-30 (×5): qty 10

## 2016-07-30 NOTE — Assessment & Plan Note (Signed)
COPD exacerbation, severe.  Plan: Nebs of Duoneb q 6 hours scheduled and albuterol q 2 hours prn.  IV methylprednisolone scheduled. Sputum culture ordered. IV ceftriaxone and IV azithromycin empirically in case of infection.  Continuous oxygen support.  Keep sats below 95% due to COPD.

## 2016-07-30 NOTE — Assessment & Plan Note (Signed)
Plan: Hold oral diabetes medications.  Check fingerstick blood sugars q ac and hs.  Sliding scale insulin.

## 2016-07-30 NOTE — Progress Notes (Signed)
ANTICOAGULATION CONSULT NOTE - Initial Consult  Pharmacy Consult for coumadin Indication: atrial fibrillation  Allergies  Allergen Reactions  . Torsemide Nausea And Vomiting  . Macrobid [Nitrofurantoin Monohyd Macro] Rash  . Tramadol Nausea Only    Patient Measurements: Height: 5\' 2"  (157.5 cm) Weight: 131 lb 3.2 oz (59.5 kg) IBW/kg (Calculated) : 50.1   Vital Signs: Temp: 98.1 F (36.7 C) (12/21 0505) Temp Source: Oral (12/21 0505) BP: 139/78 (12/21 0505) Pulse Rate: 73 (12/21 0505)  Labs:  Recent Labs  07/29/16 2030 07/30/16 0222  HGB 9.3* 8.9*  HCT 28.9* 27.5*  PLT 171 149*  LABPROT  --  15.3*  INR  --  1.20  CREATININE 1.60* 1.49*  TROPONINI 0.03*  --     Estimated Creatinine Clearance: 24.2 mL/min (by C-G formula based on SCr of 1.49 mg/dL (H)).   Medical History: Past Medical History:  Diagnosis Date  . Abnormal CT of the chest    Mediastinal and hilar adenopathy followed by Dr. Koleen Nimrod in [place]  . Anemia, unspecified   . Body mass index (BMI) of 20.0-20.9 in adult FEB 2013 147 LBS  . CAD (coronary artery disease) 2011   Catheterization August 2011: mild nonobstructive coronary artery disease complicated by large left rectus sheath hematoma status post evacuation Coumadin restarted without recurrence  . Carotid artery disease ([place])    Doppler, 05/2012, 0-39% bilateral  . Cataract   . Chronic airway obstruction, not elsewhere classified   . Chronic kidney disease, stage II (mild)   . Congestive heart failure, unspecified    EF now 60%, previous nonischemic cardiomyopathy  . Diabetes mellitus   . Dilated bile duct 10/19/2011  . Diverticulitis Dec 2012  . Gastritis    By EGD  . GERD (gastroesophageal reflux disease)   . Hypercholesterolemia   . Hypertension   . Mitral regurgitation 06/27/2013  . Neurogenic sleep apnea    Uses noctural oxygen prescribed by her pulmonologi  . Pacemaker    Single chamber [place]. Jude ACCENT [phone] SN 719 04/11/1959  10/31/2010  . Permanent atrial fibrillation (HCC)    H/o tachybradycardia syndrome on Coumadin anticoagulation, has pacemaker.  . Recurrent UTI   . Rheumatic heart disease   . Tachycardia-bradycardia [facility])    s/p St. Jude single chamber PPM 10/2010   . Unspecified hypertensive kidney disease with chronic kidney disease stage I through stage IV, or unspecified(403.90)   . Valvular heart disease    a. H/o moderate mitral stenosis by cath 2011 but no mention of this on 10/2011 echo. b. 10/2011 echo: mod MR, mild AS, mild TR; EF>65%  . Vitamin D deficiency     Medications:  Prescriptions Prior to Admission  Medication Sig Dispense Refill Last Dose  . acetaminophen (TYLENOL) 500 MG tablet Take 1,000 mg by mouth every 6 (six) hours as needed for mild pain, moderate pain or fever.    07/29/2016 at 1700  . albuterol (PROVENTIL HFA;VENTOLIN HFA) 108 (90 Base) MCG/ACT inhaler Inhale 2 puffs into the lungs every 6 (six) hours as needed for wheezing or shortness of breath. 1 Inhaler 2 07/28/2016 at Unknown time  . calcium-vitamin D 250-100 MG-UNIT tablet Take 2 tablets by mouth daily.   07/28/2016 at Unknown time  . clonazePAM (KLONOPIN) 0.5 MG tablet Take 1 tablet (0.5 mg total) by mouth 3 (three) times daily as needed for anxiety. 90 tablet 1 07/29/2016 at Unknown time  . CREON 36000 units CPEP capsule TAKE 3 CAPSULES WITH MEALS  AND TAKE 1  CAPSULE WITH A SNACK AS DIRECTED 900 capsule 2 07/29/2016 at Unknown time  . denosumab (PROLIA) 60 MG/ML SOLN injection INJECT 60 MG SQ every 6 months. 1 mL 0 Due 08/2016  . diltiazem (CARDIZEM) 120 MG tablet TAKE 1 TABLET EVERY DAY 90 tablet 3 07/29/2016 at Unknown time  . ferrous sulfate 325 (65 FE) MG tablet Take 1 tablet (325 mg total) by mouth daily with breakfast.  3 07/29/2016 at Unknown time  . furosemide (LASIX) 40 MG tablet Take 0.5-1 tablets (20-40 mg total) by mouth daily. (Patient taking differently: Take 20 mg by mouth daily. ) 90 tablet 1 07/29/2016 at  Unknown time  . HYDROcodone-acetaminophen (NORCO) 7.5-325 MG tablet Take 1 tablet by mouth 3 (three) times daily as needed.   07/29/2016 at Unknown time  . ipratropium-albuterol (DUONEB) 0.5-2.5 (3) MG/3ML SOLN INHALE THE CONTENTS OF 1 VIAL VIA NEBULIZER EVERY 4 HOURS AS NEEDED 360 mL 1 07/29/2016 at Unknown time  . JANUVIA 50 MG tablet TAKE 1 TABLET EVERY DAY 90 tablet 1 07/28/2016 at Unknown time  . OXYGEN Inhale 2 L into the lungs at bedtime. 2lpm with sleep only    07/28/2016 at Unknown time  . pantoprazole (PROTONIX) 40 MG tablet TAKE 1 TABLET TWICE DAILY 180 tablet 1 07/29/2016 at Unknown time  . potassium chloride SA (K-DUR,KLOR-CON) 20 MEQ tablet Take 1 tablet (20 mEq total) by mouth every morning. 90 tablet 1 07/29/2016 at Unknown time  . Probiotic Product ([place]) CAPS Take 1 capsule by mouth daily.   UNKNOWN  . simvastatin (ZOCOR) 10 MG tablet TAKE 1 TABLET EVERY DAY 90 tablet 1 07/28/2016 at Unknown time  . SYMBICORT 160-4.5 MCG/ACT inhaler INHALE 2 PUFFS TWICE DAILY 3 Inhaler 1 07/29/2016 at Unknown time  . warfarin (COUMADIN) 2 MG tablet TAKE PER PHYSICIAN INSTRUCTIONS  (Patient taking differently: TAKE 2MG  ON THURSDAYS ONLY. TAKE 1MG  ON ALL OTHER DAYS) 135 tablet 1 07/28/2016 at 2000  . Blood Glucose Calibration (TRUE METRIX LEVEL 1) LOW SOLN Use weekly 3 each 1 Taking  . Blood Glucose Monitoring Suppl (TRUE METRIX AIR GLUCOSE METER) DEVI 1 Device by Does not apply route 2 (two) times daily. Use to Test blood sugar bid. DX E13.10 1 Device 0 Taking  . cephALEXin (KEFLEX) 500 MG capsule Take 1 capsule (500 mg total) by mouth 3 (three) times daily. (Patient not taking: Reported on 07/29/2016) 21 capsule 0 Not Taking at Unknown time  . nitroGLYCERIN (NITROSTAT) 0.4 MG SL tablet Place 1 tablet (0.4 mg total) under the tongue every 5 (five) minutes as needed for chest pain. 25 tablet 3 unknown  . TRUE METRIX BLOOD GLUCOSE TEST test strip TEST TWICE DAILY AS DIRECTED 200 each 2  Taking  . TRUEPLUS LANCETS 28G MISC TEST BLOOD SUGAR TWICE DAILY 200 each 2 Taking    Assessment: 81 yo lady to continue coumadin for afib.  Her admission INR is subtherapeutic but she may have missed doses due to spinal injection. Goal of Therapy:  INR 2-3 Monitor platelets by anticoagulation protocol: Yes   Plan:  Coumadin 2 mg po today Daily PT/INR Monitor for bleeding complications  Thanks for allowing pharmacy to be a part of this patient's care.  Excell Seltzer, PharmD Clinical Pharmacist 07/30/2016,9:03 AM

## 2016-07-30 NOTE — Care Management Note (Signed)
Case Management Note  Patient Details  Name: [name] MRN: [id number] Date of Birth: July 18, 1935  Subjective/Objective:   Patient adm from home with pneumonia. She has chronic oxygen, wears at night. She lives with granddaughter. She has a walker PTA. She is currently not active with home health. She has PCP, transportation, and insurance with prescription coverage.                Action/Plan: Anticipate DC home with self care. Will follow for any needs.    Expected Discharge Date:    07/30/2016              Expected Discharge Plan:  Home/Self Care  In-House Referral:     Discharge planning Services  CM Consult  Post Acute Care Choice:    Choice offered to:     DME Arranged:    DME Agency:     HH Arranged:    HH Agency:     Status of Service:  In process, will continue to follow  If discussed at Long Length of Stay Meetings, dates discussed:    Additional Comments:  [name], Chauncey Reading, RN 07/30/2016, 2:18 PM

## 2016-07-30 NOTE — Progress Notes (Signed)
Initial Nutrition Assessment  DOCUMENTATION CODES:  Non-severe (moderate) malnutrition in context of chronic illness   Pt meets criteria forMODERATE MALNUTRITION in the context of Chronic Illness as evidenced by Moderate wasting of fat/muscle stores.  INTERVENTION:  Ensure Enlive po BID, each supplement provides 350 kcal and 20 grams of protein  NUTRITION DIAGNOSIS:  Inadequate oral intake related to poor appetite, Diarrhea, Dyspnea as evidenced by per patient/family report.  GOAL:  Patient will meet greater than or equal to 90% of their needs  MONITOR:  PO intake, Supplement acceptance, Labs, Weight trends, I & O's  REASON FOR ASSESSMENT:  Malnutrition Screening Tool    ASSESSMENT:  81 y/o female PMhx COPD (on 2L at night), CKD2, A fib, pacemaker, CAD, CHF, DM, HLD, GERD. Presented with SOB and was worked up for Bilateral CAP.   Pt reports a generalized lack of appetite. She says she just "doesnt want to eat". At home, she typically eats 2x a day w/ 2 "small snacks" in between. She took a calcium supplement. She did not consume any nutritional beverages. She says she tried to avoid very sweet items due to DM. She checked her BG and typical readings were 105-130 mg/dl.    Additionally, the pt says she suffers from chronic diarrhea. She will have 3-4 bouts of diarrhea per day. The diarrhea does not sound to be related to PO intake. She says she has been evaluated for this, but nothing was found. Denies N/V.   She does not know what her UBW is. Per chart review, her UBW for the last 10 months or so appears to be 134-138 lbs. She may be down a few lbs from her baseline  Today, she still is SOB and does not desire to eat much, her lunch tray is seen next to her w/ roughly 10-25% meal consumed. She was agreeable to Ensure while admitted  RD recommended Ensure or high protein/kcal foods (listed) that she should aim to consume at home if she continues to lose weight or have poor PO intake.    NFPE: Moderate Fat/muscle wasting of upper body.   Medications: IV abx, Ensure Enlive BID, iron, PPI, Methylprednisolone, Probiotic, warfarin  Labs: H/H:8.9/27.5, Hypokalemic, BNP: 526.0  Recent Labs Lab 07/29/16 2030 07/30/16 0222  NA 136 134*  K 3.2* 3.1*  CL 103 101  CO2 25 24  BUN 21* 19  CREATININE 1.60* 1.49*  CALCIUM 9.3 9.1  GLUCOSE 142* 188*   Diet Order:  Diet Heart Room service appropriate? Yes; Fluid consistency: Thin  Skin:  Reviewed, no issues  Last BM:  12/19  Height:  Ht Readings from Last 1 Encounters:  07/30/16 5\' 2"  (1.575 m)   Weight:  Wt Readings from Last 1 Encounters:  07/30/16 131 lb 3.2 oz (59.5 kg)   Wt Readings from Last 10 Encounters:  07/30/16 131 lb 3.2 oz (59.5 kg)  06/18/16 136 lb (61.7 kg)  05/12/16 133 lb (60.3 kg)  04/22/16 138 lb (62.6 kg)  04/15/16 138 lb 6 oz (62.8 kg)  04/08/16 136 lb (61.7 kg)  03/16/16 134 lb (60.8 kg)  01/16/16 132 lb (59.9 kg)  12/23/15 134 lb 12.8 oz (61.1 kg)  11/25/15 133 lb (60.3 kg)   Ideal Body Weight:  50 kg  BMI:  Body mass index is 24 kg/m.  Estimated Nutritional Needs:  Kcal:  1500-1750 kcals (25-29 kcal/kg bw) Protein:  70-80 g (1.4-1.6 g/kg bw) Fluid:  Per MD  EDUCATION NEEDS:  No education needs identified at  this time  Burtis Junes RD, LDN, CNSC Clinical Nutrition Pager: [phone] 07/30/2016 2:37 PM

## 2016-07-30 NOTE — Progress Notes (Signed)
PROGRESS NOTE    [name]  WCH:[id number] DOB: 1934-11-19 DOA: 07/29/2016 PCP: [name], FNP    Brief Narrative: 6 with hx of COPD on home oxygen at night, hx of permanent afib on chronic anticoagulation, hx of SSS s/p ppm, CHF, HTN, HLD, admitted for CAP.  She was given IV Zithromax and IV Rocephin, and is feeling a little better.  Her anticoagulation has been continued per pharmacy dosing.  She is also on IV steroids.    Assessment & Plan:   Principal Problem:   Bacterial pneumonia Active Problems:   Type 2 diabetes mellitus with kidney complication, without long-term current use of insulin (HCC)   Chronic anticoagulation   COPD exacerbation (HCC)   Hypoxia   1. PNA:  Will continue with IV steroids, nebs, and IV Rocephin and Zithromax. 2. Permanent afib:  Continue with Cardiazem and Coumadin.  She is s/p ppm. 3. DM:  Stable.  Will continue with SSI.  Note she is on IV Steroids. 4. Other problems are stable, continue with meds.   DVT prophylaxis: Coumadin.  Code Status: FULL CODE.  Family Communication: None.  Disposition Plan: home when better.   Consultants:   None.   Procedures:   None.   Antimicrobials: Anti-infectives    Start     Dose/Rate Route Frequency Ordered Stop   07/30/16 2100  cefTRIAXone (ROCEPHIN) 1 g in dextrose 5 % 50 mL IVPB     1 g 100 mL/hr over 30 Minutes Intravenous Every 24 hours 07/30/16 0152     07/30/16 2100  azithromycin (ZITHROMAX) 500 mg in dextrose 5 % 250 mL IVPB     500 mg 250 mL/hr over 60 Minutes Intravenous Every 24 hours 07/30/16 0152     07/29/16 2215  cefTRIAXone (ROCEPHIN) 1 g in dextrose 5 % 50 mL IVPB     1 g 100 mL/hr over 30 Minutes Intravenous  Once 07/29/16 2203 07/29/16 2330   07/29/16 2215  azithromycin (ZITHROMAX) tablet 500 mg     500 mg Oral  Once 07/29/16 2203 07/29/16 2217   07/29/16 2145  cefTRIAXone (ROCEPHIN) 1 g in dextrose 5 % 50 mL IVPB  Status:  Discontinued     1 g 100 mL/hr  over 30 Minutes Intravenous  Once 07/29/16 2138 07/29/16 2159   07/29/16 2145  doxycycline (VIBRA-TABS) tablet 100 mg  Status:  Discontinued     100 mg Oral  Once 07/29/16 2138 07/29/16 2159       Subjective:  Still SOB.  Feeling a little better.   Objective: Vitals:   07/30/16 0000 07/30/16 0246 07/30/16 0505 07/30/16 1105  BP: (!) 156/65  139/78   Pulse: 92  73   Resp: 20  18   Temp: 97.7 F (36.5 C)  98.1 F (36.7 C)   TempSrc: Oral  Oral   SpO2: 94% 97% 98% 98%  Weight: 59.5 kg (131 lb 3.2 oz)     Height: 5\' 2"  (1.575 m)       Intake/Output Summary (Last 24 hours) at 07/30/16 1208 Last data filed at 07/29/16 2330  Gross per 24 hour  Intake               50 ml  Output                0 ml  Net               50 ml   Filed Weights   07/29/16 2013 07/30/16 0000  Weight: 60.8 kg (134 lb) 59.5 kg (131 lb 3.2 oz)    Examination:  General exam: Appears calm and comfortable  Respiratory system: Clear to auscultation. Respiratory effort normal. Cardiovascular system: S1 & S2 heard, RRR. No JVD, murmurs, rubs, gallops or clicks. No pedal edema. Gastrointestinal system: Abdomen is nondistended, soft and nontender. No organomegaly or masses felt. Normal bowel sounds heard. Central nervous system: Alert and oriented. No focal neurological deficits. Extremities: Symmetric 5 x 5 power. Skin: No rashes, lesions or ulcers Psychiatry: Judgement and insight appear normal. Mood & affect appropriate.   Data Reviewed: I have personally reviewed following labs and imaging studies  CBC:  Recent Labs Lab 07/29/16 2030 07/30/16 0222  WBC 6.1 5.0  NEUTROABS 4.9  --   HGB 9.3* 8.9*  HCT 28.9* 27.5*  MCV 94.1 94.2  PLT 171 808*   Basic Metabolic Panel:  Recent Labs Lab 07/29/16 2030 07/30/16 0222  NA 136 134*  K 3.2* 3.1*  CL 103 101  CO2 25 24  GLUCOSE 142* 188*  BUN 21* 19  CREATININE 1.60* 1.49*  CALCIUM 9.3 9.1   GFR: Estimated Creatinine Clearance: 24.2 mL/min  (by C-G formula based on SCr of 1.49 mg/dL (H)). Liver Function Tests:  Recent Labs Lab 07/29/16 2030  AST 28  ALT 11*  ALKPHOS 66  BILITOT 0.6  PROT 7.4  ALBUMIN 3.3*   Coagulation Profile:  Recent Labs Lab 07/30/16 0222  INR 1.20   Cardiac Enzymes:  Recent Labs Lab 07/29/16 2030  TROPONINI 0.03*   CBG:  Recent Labs Lab 07/30/16 0754 07/30/16 1138  GLUCAP 184* 206*   Lipid Profile: Sepsis Labs:  Recent Labs Lab 07/29/16 2036  LATICACIDVEN 1.6    Recent Results (from the past 240 hour(s))  Culture, blood (routine x 2)     Status: None (Preliminary result)   Collection Time: 07/29/16  8:36 PM  Result Value Ref Range Status   Specimen Description BLOOD RIGHT FOREARM  Final   Special Requests BOTTLES DRAWN AEROBIC AND ANAEROBIC 5CC EACH  Final   Culture NO GROWTH < 12 HOURS  Final   Report Status PENDING  Incomplete  Culture, blood (routine x 2)     Status: None (Preliminary result)   Collection Time: 07/30/16  2:22 AM  Result Value Ref Range Status   Specimen Description BLOOD RIGHT FOREARM  Final   Special Requests BOTTLES DRAWN AEROBIC AND ANAEROBIC 10CC EACH  Final   Culture NO GROWTH < 12 HOURS  Final   Report Status PENDING  Incomplete     Radiology Studies: Dg Chest 2 View  Result Date: 07/29/2016 CLINICAL DATA:  81 year old female with shortness of breath and productive cough EXAM: CHEST  2 VIEW COMPARISON:  Chest radiograph dated 06/18/2016 FINDINGS: Two views of the chest demonstrate an area of hazy density at the left lung base which may represent atelectatic changes but is concerning for developing pneumonia. Clinical correlation and follow-up to resolution is recommended. Trace left pleural effusion noted. The right lung is clear. There is no pneumothorax. Stable cardiac silhouette. Osteopenia. Stable benign-appearing chondroid lesion at the right humeral head. No acute fracture. IMPRESSION: Left lower lobe atelectasis versus infiltrate.  Clinical correlation and follow-up recommended. Probable small left pleural effusion. Electronically Signed   By: Anner Crete M.D.   On: 07/29/2016 21:42    Scheduled Meds: . azithromycin  500 mg Intravenous Q24H  . cefTRIAXone (ROCEPHIN)  IV  1 g Intravenous Q24H  . diltiazem  120  mg Oral Daily  . feeding supplement (ENSURE ENLIVE)  237 mL Oral BID BM  . ferrous sulfate  325 mg Oral Q breakfast  . fluticasone furoate-vilanterol  1 puff Inhalation Daily  . insulin aspart  0-15 Units Subcutaneous TID WC  . ipratropium-albuterol  3 mL Nebulization Q6H  . lipase/protease/amylase  36,000 Units Oral TID AC  . methylPREDNISolone (SOLU-MEDROL) injection  60 mg Intravenous Q6H  . pantoprazole  40 mg Oral BID  . saccharomyces boulardii  250 mg Oral BID  . simvastatin  10 mg Oral Daily  . sodium chloride flush  3 mL Intravenous Q12H  . warfarin  2 mg Oral Once  . Warfarin - Pharmacist Dosing Inpatient   Does not apply q1800   Continuous Infusions:   LOS: 1 day   Nashira Mcglynn, MD FACP Hospitalist.   If 7PM-7AM, please contact night-coverage [email or web address] Password [facility] 07/30/2016, 12:08 PM

## 2016-07-30 NOTE — Assessment & Plan Note (Signed)
Pneumonia. Severe. Requiring continuous oxygen support.  Type: Community acquired pneumonia.   Criteria for diagnosis: Chest x-ray suggestive of pneumonia, rhonchi on physical exam.  Likely bacterial.  Plan: Sputum culture has been ordered.  Treat with IV ceftriaxone and IV azithromycin.  Monitor oxygen saturation levels.

## 2016-07-30 NOTE — Assessment & Plan Note (Signed)
Continue warfarin and adjust. Daily INR.

## 2016-07-30 NOTE — ED Provider Notes (Signed)
[facility] DEPT Provider Note   CSN: [id number] Arrival date & time: 07/29/16  2008  By signing my name below, I, Jeanell Sparrow, attest that this documentation has been prepared under the direction and in the presence of non-physician practitioner, Emeline General, PA-C. Electronically Signed: Jeanell Sparrow, Scribe. 07/29/2016. 8:45 PM.  History   Chief Complaint Chief Complaint  Patient presents with  . Shortness of Breath   The history is provided by the patient. No language interpreter was used.   HPI Comments: [name] is a 81 y.o. female with PMHx of CHF and Afib who presents to the Emergency Department complaining of intermittent moderate SOB that started last night. She states she started having gradually worsening SOB since going to the bathroom last night, but initial onset was a week ago. She reports no modifying factors. She tried a nebulizer treatment and Symbicort PTA without any relief. She reports associated symptoms of persistent cough, chest tightness when lying down, wheezing, generalized abdominal discomfort described as burning, fever of 100, chills, and headache. Pt's temperature in the ED today was 99. She states that she has taken antipyretics 3 hours prior to arrival. She denies any recent medication noncompliance, nausea, vomiting, diarrhea or LE swelling.     PCP: [name], FNP  Past Medical History:  Diagnosis Date  . Abnormal CT of the chest    Mediastinal and hilar adenopathy followed by Dr. Koleen Nimrod in [place]  . Anemia, unspecified   . Body mass index (BMI) of 20.0-20.9 in adult FEB 2013 147 LBS  . CAD (coronary artery disease) 2011   Catheterization August 2011: mild nonobstructive coronary artery disease complicated by large left rectus sheath hematoma status post evacuation Coumadin restarted without recurrence  . Carotid artery disease ([place])    Doppler, 05/2012, 0-39% bilateral  . Cataract   . Chronic airway obstruction,  not elsewhere classified   . Chronic kidney disease, stage II (mild)   . Congestive heart failure, unspecified    EF now 60%, previous nonischemic cardiomyopathy  . Diabetes mellitus   . Dilated bile duct 10/19/2011  . Diverticulitis Dec 2012  . Gastritis    By EGD  . GERD (gastroesophageal reflux disease)   . Hypercholesterolemia   . Hypertension   . Mitral regurgitation 06/27/2013  . Neurogenic sleep apnea    Uses noctural oxygen prescribed by her pulmonologi  . Pacemaker    Single chamber [place]. Jude ACCENT [phone] SN 719 04/11/1957 10/31/2010  . Permanent atrial fibrillation (HCC)    H/o tachybradycardia syndrome on Coumadin anticoagulation, has pacemaker.  . Recurrent UTI   . Rheumatic heart disease   . Tachycardia-bradycardia [facility])    s/p St. Jude single chamber PPM 10/2010   . Unspecified hypertensive kidney disease with chronic kidney disease stage I through stage IV, or unspecified(403.90)   . Valvular heart disease    a. H/o moderate mitral stenosis by cath 2011 but no mention of this on 10/2011 echo. b. 10/2011 echo: mod MR, mild AS, mild TR; EF>65%  . Vitamin D deficiency     Patient Active Problem List   Diagnosis Date Noted  . COPD exacerbation ([place]) 07/29/2016  . Microalbuminuria due to type 2 diabetes mellitus ([place]) 07/20/2016  . At high risk for falls 02/13/2016  . Chronic kidney disease (CKD), stage IV (severe) ([place]) 07/02/2015  . Nocturnal hypoxemia 04/04/2015  . Hyperlipidemia with target LDL less than 100 12/31/2014  . Cigarette smoker 11/21/2014  . Osteoporosis, post-menopausal 11/01/2013  . GERD (gastroesophageal  reflux disease) 07/13/2013  . Diarrhea 07/13/2013  . Aortic stenosis 06/27/2013  . Acute on chronic diastolic CHF (congestive heart failure) ([place]) 06/27/2013  . Vitamin D deficiency   . Tobacco abuse 05/12/2012  . Abdominal wall hernia 10/19/2011  . CAD (coronary artery disease)   . Pacemaker-St.Jude   . Chronic anticoagulation 12/03/2010  .  Anxiety 12/03/2010  . Mitral stenosis 05/30/2010  . Anemia 05/16/2010  . Type 2 diabetes mellitus with kidney complication, without long-term current use of insulin ([place]) 01/19/2009  . Essential hypertension 01/19/2009  . Permanent atrial fibrillation ([place]) 01/19/2009  . COPD GOLD O  01/19/2009  . HEART MURMUR, BENIGN 01/19/2009    Past Surgical History:  Procedure Laterality Date  . CHOLECYSTECTOMY     40+ years ago  . COLONOSCOPY  10/2011   Dyann Ruddle sigmoid to descending diverticulosis, internal hemorrhoids  . ESOPHAGOGASTRODUODENOSCOPY  10/2011   Fields-erosive gastritis, biopsy with no H. pylori, hiatal hernia, peptic duodenitis, no celiac disease, duodenal diverticulum, duodenal nodule  . EUS  11/16/11   Mishra-13 MM CBD, normal pancreatic duct, otherwise normal EUS  . Evacuation of retroperitoneal and left rectus sheath    . HEMATOMA EVACUATION     femoral  . HERNIA REPAIR     X3  . PILONIDAL CYST / SINUS EXCISION    . Single-chamber pacemaker implantation  3/12   by Dr Caryl Comes    OB History    Durenda Age Para Term Preterm AB Living   2 2 2          SAB TAB Ectopic Multiple Live Births                   Home Medications    Prior to Admission medications   Medication Sig Start Date End Date Taking? Authorizing Provider  acetaminophen (TYLENOL) 500 MG tablet Take 1,000 mg by mouth every 6 (six) hours as needed for mild pain, moderate pain or fever.    Yes Historical Provider, MD  albuterol (PROVENTIL HFA;VENTOLIN HFA) 108 (90 Base) MCG/ACT inhaler Inhale 2 puffs into the lungs every 6 (six) hours as needed for wheezing or shortness of breath. 11/26/15  Yes Ezequiel Essex, MD  calcium-vitamin D 250-100 MG-UNIT tablet Take 2 tablets by mouth daily. 12/05/15  Yes Tammy Eckard, PharmD  clonazePAM (KLONOPIN) 0.5 MG tablet Take 1 tablet (0.5 mg total) by mouth 3 (three) times daily as needed for anxiety. 06/30/16  Yes [name], FNP  CREON 36000 units CPEP capsule  TAKE 3 CAPSULES WITH MEALS  AND TAKE 1 CAPSULE WITH A SNACK AS DIRECTED 05/28/16  Yes [name], FNP  denosumab (PROLIA) 60 MG/ML SOLN injection INJECT 60 MG SQ every 6 months. 01/09/16  Yes [name], FNP  diltiazem (CARDIZEM) 120 MG tablet TAKE 1 TABLET EVERY DAY 06/16/16  Yes Herminio Commons, MD  ferrous sulfate 325 (65 FE) MG tablet Take 1 tablet (325 mg total) by mouth daily with breakfast. 10/29/15  Yes Cherre Robins, PharmD  furosemide (LASIX) 40 MG tablet Take 0.5-1 tablets (20-40 mg total) by mouth daily. Patient taking differently: Take 20 mg by mouth daily.  07/20/16  Yes Cherre Robins, PharmD  HYDROcodone-acetaminophen (NORCO) 7.5-325 MG tablet Take 1 tablet by mouth 3 (three) times daily as needed. 02/17/16  Yes Historical Provider, MD  ipratropium-albuterol (DUONEB) 0.5-2.5 (3) MG/3ML SOLN INHALE THE CONTENTS OF 1 VIAL VIA NEBULIZER EVERY 4 HOURS AS NEEDED 03/20/16  Yes [name], FNP  JANUVIA 50 MG tablet TAKE 1 TABLET EVERY  DAY 03/31/16  Yes [name], FNP  OXYGEN Inhale 2 L into the lungs at bedtime. 2lpm with sleep only    Yes Historical Provider, MD  pantoprazole (PROTONIX) 40 MG tablet TAKE 1 TABLET TWICE DAILY 06/23/16  Yes [name], FNP  potassium chloride SA (K-DUR,KLOR-CON) 20 MEQ tablet Take 1 tablet (20 mEq total) by mouth every morning. 04/22/16  Yes [name], FNP  Probiotic Product ([place]) CAPS Take 1 capsule by mouth daily.   Yes Historical Provider, MD  simvastatin (ZOCOR) 10 MG tablet TAKE 1 TABLET EVERY DAY 03/18/16  Yes [name], FNP  SYMBICORT 160-4.5 MCG/ACT inhaler INHALE 2 PUFFS TWICE DAILY 03/18/16  Yes [name], FNP  warfarin (COUMADIN) 2 MG tablet TAKE PER PHYSICIAN INSTRUCTIONS  Patient taking differently: TAKE 2MG  ON THURSDAYS ONLY. TAKE 1MG  ON ALL OTHER DAYS 03/31/16  Yes [name], FNP  Blood Glucose Calibration (TRUE METRIX LEVEL 1) LOW SOLN Use  weekly 10/23/14   [name], FNP  Blood Glucose Monitoring Suppl (TRUE METRIX AIR GLUCOSE METER) DEVI 1 Device by Does not apply route 2 (two) times daily. Use to Test blood sugar bid. DX E13.10 12/19/14   [name], FNP  cephALEXin (KEFLEX) 500 MG capsule Take 1 capsule (500 mg total) by mouth 3 (three) times daily. Patient not taking: Reported on 07/29/2016 07/06/16   [name], FNP  nitroGLYCERIN (NITROSTAT) 0.4 MG SL tablet Place 1 tablet (0.4 mg total) under the tongue every 5 (five) minutes as needed for chest pain. 07/20/14   Herminio Commons, MD  TRUE METRIX BLOOD GLUCOSE TEST test strip TEST TWICE DAILY AS DIRECTED 01/01/16   [name], FNP  TRUEPLUS LANCETS 28G MISC TEST BLOOD SUGAR TWICE DAILY 01/01/16   [name], FNP    Family History Family History  Problem Relation Age of Onset  . Cancer Mother     cervical  . Stroke Mother   . Cancer Daughter 4    kidney, lung  . Heart disease Father   . Heart attack Father   . Ulcers Father   . Early death Sister 1    died at 17 months old  . Hypertension Brother   . Cancer Paternal Aunt     breast  . Hypertension Son   . Colon cancer Neg Hx     Social History Social History  Substance Use Topics  . Smoking status: Current Every Day Smoker    Packs/day: 0.25    Years: 45.00    Types: Cigarettes    Start date: 02/05/1958  . Smokeless tobacco: Never Used     Comment: smokes 5-6 cigarettes daily  . Alcohol use No     Allergies   Torsemide; Macrobid [nitrofurantoin monohyd macro]; and Tramadol   Review of Systems Review of Systems  Constitutional: Positive for chills and fever (Tmax: 100).  HENT: Positive for rhinorrhea. Negative for sore throat.   Respiratory: Positive for chest tightness, shortness of breath and wheezing.   Cardiovascular: Negative for leg swelling.  Gastrointestinal: Positive for abdominal pain (Generalized). Negative for nausea and vomiting.    Genitourinary: Negative for difficulty urinating, dysuria and hematuria.  Musculoskeletal: Negative for back pain, gait problem, neck pain and neck stiffness.  Skin: Negative for color change, pallor, rash and wound.  Neurological: Positive for headaches.     Physical Exam Updated Vital Signs BP 136/89 (BP Location: Left Arm)   Pulse 81   Temp 99 F (37.2 C)   Resp 23  Ht 5\' 2"  (1.575 m)   Wt 134 lb (60.8 kg)   SpO2 98%   BMI 24.51 kg/m   Physical Exam  Constitutional: She appears well-developed and well-nourished. No acute distress.  HENT:  Head: Normocephalic  Eyes: Conjunctivae are normal.  Neck: Neck supple.  Cardiovascular: Normal rate and regular rhythm.   Murmur heard. Crescendo decrescendo systolic murmur is present radiating to the axilla Pulmonary/Chest: Effort normal.  Bilateral wheezing. Retractions.  Abdominal: Soft, non-tender  Musculoskeletal: Normal range of motion.  Neurological: She is alert.  Skin: Skin is warm and dry.  Psychiatric: She has a normal mood and affect.  Nursing note and vitals reviewed.    ED Treatments / Results  DIAGNOSTIC STUDIES: Oxygen Saturation is 98% on 1L/min O2, normal by my interpretation.    COORDINATION OF CARE: 8:50 PM- Pt advised of plan for treatment and pt agrees.  Labs (all labs ordered are listed, but only abnormal results are displayed) Labs Reviewed  CBC WITH DIFFERENTIAL/PLATELET - Abnormal; Notable for the following:       Result Value   RBC 3.07 (*)    Hemoglobin 9.3 (*)    HCT 28.9 (*)    RDW 15.9 (*)    All other components within normal limits  COMPREHENSIVE METABOLIC PANEL - Abnormal; Notable for the following:    Potassium 3.2 (*)    Glucose, Bld 142 (*)    BUN 21 (*)    Creatinine, Ser 1.60 (*)    Albumin 3.3 (*)    ALT 11 (*)    GFR calc non Af Amer 30 (*)    GFR calc Af Amer 34 (*)    All other components within normal limits  TROPONIN I - Abnormal; Notable for the following:     Troponin I 0.03 (*)    All other components within normal limits  BRAIN NATRIURETIC PEPTIDE - Abnormal; Notable for the following:    B Natriuretic Peptide 526.0 (*)    All other components within normal limits  LACTIC ACID, PLASMA    EKG  EKG Interpretation  Date/Time:  Wednesday July 29 2016 20:17:05 EST Ventricular Rate:  88 PR Interval:    QRS Duration: 110 QT Interval:  376 QTC Calculation: 454 R Axis:   -84 Text Interpretation:  Undetermined rhythm Left axis deviation Right bundle branch block Anteroseptal infarct , age undetermined Abnormal ECG since last tracing no significant change Confirmed by MILLER  MD, BRIAN ([id number]) on 07/29/2016 9:05:53 PM       Radiology Dg Chest 2 View  Result Date: 07/29/2016 CLINICAL DATA:  81 year old female with shortness of breath and productive cough EXAM: CHEST  2 VIEW COMPARISON:  Chest radiograph dated 06/18/2016 FINDINGS: Two views of the chest demonstrate an area of hazy density at the left lung base which may represent atelectatic changes but is concerning for developing pneumonia. Clinical correlation and follow-up to resolution is recommended. Trace left pleural effusion noted. The right lung is clear. There is no pneumothorax. Stable cardiac silhouette. Osteopenia. Stable benign-appearing chondroid lesion at the right humeral head. No acute fracture. IMPRESSION: Left lower lobe atelectasis versus infiltrate. Clinical correlation and follow-up recommended. Probable small left pleural effusion. Electronically Signed   By: Anner Crete M.D.   On: 07/29/2016 21:42    Procedures Procedures (including critical care time)  Medications Ordered in ED Medications  albuterol (PROVENTIL) (2.5 MG/3ML) 0.083% nebulizer solution 5 mg (5 mg Nebulization Given 07/29/16 2036)  ipratropium-albuterol (DUONEB) 0.5-2.5 (3) MG/3ML nebulizer solution  3 mL (3 mLs Nebulization Given 07/29/16 2155)  methylPREDNISolone sodium succinate (SOLU-MEDROL)  125 mg/2 mL injection 125 mg (125 mg Intravenous Given 07/29/16 2218)  cefTRIAXone (ROCEPHIN) 1 g in dextrose 5 % 50 mL IVPB (0 g Intravenous Stopped 07/29/16 2330)  azithromycin (ZITHROMAX) tablet 500 mg (500 mg Oral Given 07/29/16 2217)     Initial Impression / Assessment and Plan / ED Course  I have reviewed the triage vital signs and the nursing notes.  Pertinent labs & imaging results that were available during my care of the patient were reviewed by me and considered in my medical decision making (see chart for details).  Patient presenting with COPD exacerbation with elevated BNP and chest xray showing evidence of pneumonia. Her troponin was also elevated at 0.03. Initiated antibiotics in the ED and patient given duonebs and solumedrol. She had wheezing on exam even after the first duoneb. When I saw her again after her second duoneb, she was feeling better and comfortable.She will need to be admitted for pneumonia and COPD exacerbation and further evaluation.  Patient was discussed with Dr. Sabra Heck who also has seen patient and agrees with assessment and plan. He spoke to admitting physician regarding this patient.    Final Clinical Impressions(s) / ED Diagnoses   Final diagnoses:  COPD exacerbation ([place])  Community acquired pneumonia of left lower lobe of lung ([place])    New Prescriptions Current Discharge Medication List    I personally performed the services described in this documentation, which was scribed in my presence. The recorded information has been reviewed and is accurate.     Emeline General, PA-C 07/30/16 0057    Noemi Chapel, MD 07/30/16 [phone]

## 2016-07-30 NOTE — Assessment & Plan Note (Signed)
Patient has dyspnea  But is improving. Plan:  Place patient on oxygen by [place] and increase as needed.  Monitor oxygen saturation levels.

## 2016-07-31 DIAGNOSIS — Z7901 Long term (current) use of anticoagulants: Secondary | ICD-10-CM

## 2016-07-31 DIAGNOSIS — J159 Unspecified bacterial pneumonia: Principal | ICD-10-CM

## 2016-07-31 DIAGNOSIS — E1121 Type 2 diabetes mellitus with diabetic nephropathy: Secondary | ICD-10-CM

## 2016-07-31 DIAGNOSIS — J441 Chronic obstructive pulmonary disease with (acute) exacerbation: Secondary | ICD-10-CM

## 2016-07-31 LAB — PROTIME-INR
INR: 1.23
PROTHROMBIN TIME: 15.6 s — AB (ref 11.4–15.2)

## 2016-07-31 LAB — GLUCOSE, CAPILLARY
GLUCOSE-CAPILLARY: 216 mg/dL — AB (ref 65–99)
Glucose-Capillary: 158 mg/dL — ABNORMAL HIGH (ref 65–99)
Glucose-Capillary: 143 mg/dL — ABNORMAL HIGH (ref 65–99)
Glucose-Capillary: 147 mg/dL — ABNORMAL HIGH (ref 65–99)

## 2016-07-31 MED ORDER — GUAIFENESIN ER 600 MG PO TB12
1200.0000 mg | ORAL_TABLET | Freq: Two times a day (BID) | ORAL | Status: DC
  Administered 2016-07-31 – 2016-08-02 (×4): 1200 mg via ORAL
  Filled 2016-07-31 (×4): qty 2

## 2016-07-31 MED ORDER — ALUM & MAG HYDROXIDE-SIMETH 200-200-20 MG/5ML PO SUSP
30.0000 mL | ORAL | Status: DC | PRN
  Administered 2016-07-31 – 2016-08-01 (×2): 30 mL via ORAL
  Filled 2016-07-31 (×2): qty 30

## 2016-07-31 MED ORDER — SALINE SPRAY 0.65 % NA SOLN: 1.0000 | NASAL | Status: DC | PRN

## 2016-07-31 MED ORDER — WARFARIN SODIUM 2 MG PO TABS
3.0000 mg | ORAL_TABLET | Freq: Once | ORAL | Status: AC
  Administered 2016-07-31: 3 mg via ORAL
  Filled 2016-07-31: qty 1

## 2016-07-31 NOTE — Progress Notes (Signed)
PROGRESS NOTE    [name]  PZW:[id number] DOB: 09-07-1934 DOA: 07/29/2016 PCP: [name], FNP    Brief Narrative: 40 with hx of COPD on home oxygen at night, hx of permanent afib on chronic anticoagulation, hx of SSS s/p ppm, CHF, HTN, HLD, admitted for CAP.  She was given IV Zithromax and IV Rocephin, and is feeling a little better.  Her anticoagulation has been continued per pharmacy dosing.  She is also on IV steroids.    Assessment & Plan:   Principal Problem:   Bacterial pneumonia Active Problems:   Type 2 diabetes mellitus with kidney complication, without long-term current use of insulin (HCC)   Chronic anticoagulation   COPD exacerbation (HCC)   Hypoxia   1. Community-acquired pneumonia:  Continue with IV Rocephin and Zithromax. If remains stable, can consider transitioning to oral antibiotics in a.m. 2. Permanent afib:  Continue with Cardiazem and Coumadin.  She is s/p ppm. 3. DM:  Stable.  Will continue with SSI.  Note she is on IV Steroids. 4. Acute on chronic respiratory failure with hypoxia. Patient reports that she wears oxygen at night. Currently she is requiring 24 hours. Likely exacerbated by pneumonia. Continue to monitor. 5. COPD exacerbation. Currently on intravenous steroids, antibiotics and bronchodilators. Continues to wheeze. Continue pulmonary hygiene and current treatments.   DVT prophylaxis: Coumadin.  Code Status: FULL CODE.  Family Communication: None.  Disposition Plan: home when better.   Consultants:   None.   Procedures:   None.   Antimicrobials: Anti-infectives    Start     Dose/Rate Route Frequency Ordered Stop   07/30/16 2100  cefTRIAXone (ROCEPHIN) 1 g in dextrose 5 % 50 mL IVPB     1 g 100 mL/hr over 30 Minutes Intravenous Every 24 hours 07/30/16 0152     07/30/16 2100  azithromycin (ZITHROMAX) 500 mg in dextrose 5 % 250 mL IVPB     500 mg 250 mL/hr over 60 Minutes Intravenous Every 24 hours 07/30/16 0152     07/29/16 2215  cefTRIAXone (ROCEPHIN) 1 g in dextrose 5 % 50 mL IVPB     1 g 100 mL/hr over 30 Minutes Intravenous  Once 07/29/16 2203 07/29/16 2330   07/29/16 2215  azithromycin (ZITHROMAX) tablet 500 mg     500 mg Oral  Once 07/29/16 2203 07/29/16 2217   07/29/16 2145  cefTRIAXone (ROCEPHIN) 1 g in dextrose 5 % 50 mL IVPB  Status:  Discontinued     1 g 100 mL/hr over 30 Minutes Intravenous  Once 07/29/16 2138 07/29/16 2159   07/29/16 2145  doxycycline (VIBRA-TABS) tablet 100 mg  Status:  Discontinued     100 mg Oral  Once 07/29/16 2138 07/29/16 2159      Subjective:  Patient continues to feel short of breath. She continues to wheeze.   Objective: Vitals:   07/31/16 0228 07/31/16 0700 07/31/16 1054 07/31/16 1500  BP:   140/67 109/86  Pulse:    86  Resp:    (!) 21  Temp:    97.9 F (36.6 C)  TempSrc:    Oral  SpO2: 98% 97%  99%  Weight:      Height:        Intake/Output Summary (Last 24 hours) at 07/31/16 1705 Last data filed at 07/31/16 1500  Gross per 24 hour  Intake              783 ml  Output  550 ml  Net              233 ml   Filed Weights   07/29/16 2013 07/30/16 0000  Weight: 60.8 kg (134 lb) 59.5 kg (131 lb 3.2 oz)    Examination:  General exam: Appears calm and comfortable  Respiratory system: Diminished breath sounds with bilateral wheezes. Respiratory effort normal. Cardiovascular system: S1 & S2 heard, irregular. No JVD, murmurs, rubs, gallops or clicks. No pedal edema. Gastrointestinal system: Abdomen is nondistended, soft and nontender. No organomegaly or masses felt. Normal bowel sounds heard. Central nervous system: Alert and oriented. No focal neurological deficits. Extremities: Symmetric 5 x 5 power. Skin: No rashes, lesions or ulcers Psychiatry: Judgement and insight appear normal. Mood & affect appropriate.   Data Reviewed: I have personally reviewed following labs and imaging studies  CBC:  Recent Labs Lab 07/29/16 2030  07/30/16 0222  WBC 6.1 5.0  NEUTROABS 4.9  --   HGB 9.3* 8.9*  HCT 28.9* 27.5*  MCV 94.1 94.2  PLT 171 161*   Basic Metabolic Panel:  Recent Labs Lab 07/29/16 2030 07/30/16 0222  NA 136 134*  K 3.2* 3.1*  CL 103 101  CO2 25 24  GLUCOSE 142* 188*  BUN 21* 19  CREATININE 1.60* 1.49*  CALCIUM 9.3 9.1   GFR: Estimated Creatinine Clearance: 24.2 mL/min (by C-G formula based on SCr of 1.49 mg/dL (H)). Liver Function Tests:  Recent Labs Lab 07/29/16 2030  AST 28  ALT 11*  ALKPHOS 66  BILITOT 0.6  PROT 7.4  ALBUMIN 3.3*   Coagulation Profile:  Recent Labs Lab 07/30/16 0222 07/31/16 0517  INR 1.20 1.23   Cardiac Enzymes:  Recent Labs Lab 07/29/16 2030  TROPONINI 0.03*   CBG:  Recent Labs Lab 07/30/16 1716 07/30/16 2024 07/31/16 0739 07/31/16 1128 07/31/16 1639  GLUCAP 151* 133* 158* 143* 216*   Lipid Profile: Sepsis Labs:  Recent Labs Lab 07/29/16 2036  LATICACIDVEN 1.6    Recent Results (from the past 240 hour(s))  Culture, blood (routine x 2)     Status: None (Preliminary result)   Collection Time: 07/29/16  8:36 PM  Result Value Ref Range Status   Specimen Description BLOOD RIGHT FOREARM  Final   Special Requests BOTTLES DRAWN AEROBIC AND ANAEROBIC 5CC EACH  Final   Culture NO GROWTH 1 DAY  Final   Report Status PENDING  Incomplete  Culture, blood (routine x 2)     Status: None (Preliminary result)   Collection Time: 07/30/16  2:22 AM  Result Value Ref Range Status   Specimen Description BLOOD RIGHT FOREARM  Final   Special Requests BOTTLES DRAWN AEROBIC AND ANAEROBIC 10CC EACH  Final   Culture NO GROWTH 1 DAY  Final   Report Status PENDING  Incomplete     Radiology Studies: Dg Chest 2 View  Result Date: 07/29/2016 CLINICAL DATA:  81 year old female with shortness of breath and productive cough EXAM: CHEST  2 VIEW COMPARISON:  Chest radiograph dated 06/18/2016 FINDINGS: Two views of the chest demonstrate an area of hazy density  at the left lung base which may represent atelectatic changes but is concerning for developing pneumonia. Clinical correlation and follow-up to resolution is recommended. Trace left pleural effusion noted. The right lung is clear. There is no pneumothorax. Stable cardiac silhouette. Osteopenia. Stable benign-appearing chondroid lesion at the right humeral head. No acute fracture. IMPRESSION: Left lower lobe atelectasis versus infiltrate. Clinical correlation and follow-up recommended. Probable small left pleural  effusion. Electronically Signed   By: Anner Crete M.D.   On: 07/29/2016 21:42    Scheduled Meds: . azithromycin  500 mg Intravenous Q24H  . cefTRIAXone (ROCEPHIN)  IV  1 g Intravenous Q24H  . diltiazem  120 mg Oral Daily  . feeding supplement (ENSURE ENLIVE)  237 mL Oral BID BM  . ferrous sulfate  325 mg Oral Q breakfast  . fluticasone furoate-vilanterol  1 puff Inhalation Daily  . guaiFENesin  1,200 mg Oral BID  . insulin aspart  0-15 Units Subcutaneous TID WC  . ipratropium-albuterol  3 mL Nebulization Q6H  . lipase/protease/amylase  36,000 Units Oral TID AC  . methylPREDNISolone (SOLU-MEDROL) injection  60 mg Intravenous Q6H  . pantoprazole  40 mg Oral BID  . saccharomyces boulardii  250 mg Oral BID  . simvastatin  10 mg Oral Daily  . sodium chloride flush  3 mL Intravenous Q12H  . warfarin  3 mg Oral Once  . Warfarin - Pharmacist Dosing Inpatient   Does not apply q1800   Continuous Infusions:   LOS: 2 days   Rashia Mckesson, MD  Hospitalist.   If 7PM-7AM, please contact night-coverage [email or web address] Password [facility] 07/31/2016, 5:05 PM

## 2016-07-31 NOTE — Care Management Important Message (Signed)
Important Message  Patient Details  Name: [name] MRN: [id number] Date of Birth: [birth year]   Medicare Important Message Given:  Yes    Chandan Fly, Chauncey Reading, RN 07/31/2016, 12:59 PM

## 2016-07-31 NOTE — Progress Notes (Signed)
[place] for coumadin Indication: atrial fibrillation  Allergies  Allergen Reactions  . Torsemide Nausea And Vomiting  . Macrobid [Nitrofurantoin Monohyd Macro] Rash  . Tramadol Nausea Only    Patient Measurements: Height: 5\' 2"  (157.5 cm) Weight: 131 lb 3.2 oz (59.5 kg) IBW/kg (Calculated) : 50.1   Vital Signs: Temp: 97.8 F (36.6 C) (12/21 2310) Temp Source: Oral (12/21 2310) BP: 127/64 (12/21 2310) Pulse Rate: 73 (12/21 2310)  Labs:  Recent Labs  07/29/16 2030 07/30/16 0222 07/31/16 0517  HGB 9.3* 8.9*  --   HCT 28.9* 27.5*  --   PLT 171 149*  --   LABPROT  --  15.3* 15.6*  INR  --  1.20 1.23  CREATININE 1.60* 1.49*  --   TROPONINI 0.03*  --   --     Estimated Creatinine Clearance: 24.2 mL/min (by C-G formula based on SCr of 1.49 mg/dL (H)).   Medical History: Past Medical History:  Diagnosis Date  . Abnormal CT of the chest    Mediastinal and hilar adenopathy followed by Dr. Koleen Nimrod in [place]  . Anemia, unspecified   . Body mass index (BMI) of 20.0-20.9 in adult FEB 2013 147 LBS  . CAD (coronary artery disease) 2011   Catheterization August 2011: mild nonobstructive coronary artery disease complicated by large left rectus sheath hematoma status post evacuation Coumadin restarted without recurrence  . Carotid artery disease ([place])    Doppler, 05/2012, 0-39% bilateral  . Cataract   . Chronic airway obstruction, not elsewhere classified   . Chronic kidney disease, stage II (mild)   . Congestive heart failure, unspecified    EF now 60%, previous nonischemic cardiomyopathy  . Diabetes mellitus   . Dilated bile duct 10/19/2011  . Diverticulitis Dec 2012  . Gastritis    By EGD  . GERD (gastroesophageal reflux disease)   . Hypercholesterolemia   . Hypertension   . Mitral regurgitation 06/27/2013  . Neurogenic sleep apnea    Uses noctural oxygen prescribed by her pulmonologi  . Pacemaker    Single chamber [place]. Jude  ACCENT [phone] SN 719 04/11/1957 10/31/2010  . Permanent atrial fibrillation (HCC)    H/o tachybradycardia syndrome on Coumadin anticoagulation, has pacemaker.  . Recurrent UTI   . Rheumatic heart disease   . Tachycardia-bradycardia [facility])    s/p St. Jude single chamber PPM 10/2010   . Unspecified hypertensive kidney disease with chronic kidney disease stage I through stage IV, or unspecified(403.90)   . Valvular heart disease    a. H/o moderate mitral stenosis by cath 2011 but no mention of this on 10/2011 echo. b. 10/2011 echo: mod MR, mild AS, mild TR; EF>65%  . Vitamin D deficiency     Medications:  Prescriptions Prior to Admission  Medication Sig Dispense Refill Last Dose  . acetaminophen (TYLENOL) 500 MG tablet Take 1,000 mg by mouth every 6 (six) hours as needed for mild pain, moderate pain or fever.    07/29/2016 at 1700  . albuterol (PROVENTIL HFA;VENTOLIN HFA) 108 (90 Base) MCG/ACT inhaler Inhale 2 puffs into the lungs every 6 (six) hours as needed for wheezing or shortness of breath. 1 Inhaler 2 07/28/2016 at Unknown time  . calcium-vitamin D 250-100 MG-UNIT tablet Take 2 tablets by mouth daily.   07/28/2016 at Unknown time  . clonazePAM (KLONOPIN) 0.5 MG tablet Take 1 tablet (0.5 mg total) by mouth 3 (three) times daily as needed for anxiety. 90 tablet 1 07/29/2016 at Unknown  time  . CREON 36000 units CPEP capsule TAKE 3 CAPSULES WITH MEALS  AND TAKE 1 CAPSULE WITH A SNACK AS DIRECTED 900 capsule 2 07/29/2016 at Unknown time  . denosumab (PROLIA) 60 MG/ML SOLN injection INJECT 60 MG SQ every 6 months. 1 mL 0 Due 08/2016  . diltiazem (CARDIZEM) 120 MG tablet TAKE 1 TABLET EVERY DAY 90 tablet 3 07/29/2016 at Unknown time  . ferrous sulfate 325 (65 FE) MG tablet Take 1 tablet (325 mg total) by mouth daily with breakfast.  3 07/29/2016 at Unknown time  . furosemide (LASIX) 40 MG tablet Take 0.5-1 tablets (20-40 mg total) by mouth daily. (Patient taking differently: Take 20 mg by mouth  daily. ) 90 tablet 1 07/29/2016 at Unknown time  . HYDROcodone-acetaminophen (NORCO) 7.5-325 MG tablet Take 1 tablet by mouth 3 (three) times daily as needed.   07/29/2016 at Unknown time  . ipratropium-albuterol (DUONEB) 0.5-2.5 (3) MG/3ML SOLN INHALE THE CONTENTS OF 1 VIAL VIA NEBULIZER EVERY 4 HOURS AS NEEDED 360 mL 1 07/29/2016 at Unknown time  . JANUVIA 50 MG tablet TAKE 1 TABLET EVERY DAY 90 tablet 1 07/28/2016 at Unknown time  . OXYGEN Inhale 2 L into the lungs at bedtime. 2lpm with sleep only    07/28/2016 at Unknown time  . pantoprazole (PROTONIX) 40 MG tablet TAKE 1 TABLET TWICE DAILY 180 tablet 1 07/29/2016 at Unknown time  . potassium chloride SA (K-DUR,KLOR-CON) 20 MEQ tablet Take 1 tablet (20 mEq total) by mouth every morning. 90 tablet 1 07/29/2016 at Unknown time  . Probiotic Product ([place]) CAPS Take 1 capsule by mouth daily.   UNKNOWN  . simvastatin (ZOCOR) 10 MG tablet TAKE 1 TABLET EVERY DAY 90 tablet 1 07/28/2016 at Unknown time  . SYMBICORT 160-4.5 MCG/ACT inhaler INHALE 2 PUFFS TWICE DAILY 3 Inhaler 1 07/29/2016 at Unknown time  . warfarin (COUMADIN) 2 MG tablet TAKE PER PHYSICIAN INSTRUCTIONS  (Patient taking differently: TAKE 2MG  ON THURSDAYS ONLY. TAKE 1MG  ON ALL OTHER DAYS) 135 tablet 1 07/28/2016 at 2000  . Blood Glucose Calibration (TRUE METRIX LEVEL 1) LOW SOLN Use weekly 3 each 1 Taking  . Blood Glucose Monitoring Suppl (TRUE METRIX AIR GLUCOSE METER) DEVI 1 Device by Does not apply route 2 (two) times daily. Use to Test blood sugar bid. DX E13.10 1 Device 0 Taking  . cephALEXin (KEFLEX) 500 MG capsule Take 1 capsule (500 mg total) by mouth 3 (three) times daily. (Patient not taking: Reported on 07/29/2016) 21 capsule 0 Not Taking at Unknown time  . nitroGLYCERIN (NITROSTAT) 0.4 MG SL tablet Place 1 tablet (0.4 mg total) under the tongue every 5 (five) minutes as needed for chest pain. 25 tablet 3 unknown  . TRUE METRIX BLOOD GLUCOSE TEST test strip TEST  TWICE DAILY AS DIRECTED 200 each 2 Taking  . TRUEPLUS LANCETS 28G MISC TEST BLOOD SUGAR TWICE DAILY 200 each 2 Taking    Assessment: 81 yo lady to continue coumadin for afib.  INR remains subtherapeutic but she may have missed doses due to spinal injection. Goal of Therapy:  INR 2-3 Monitor platelets by anticoagulation protocol: Yes   Plan:  Coumadin 3 mg po today Daily PT/INR Monitor for bleeding complications  Thanks for allowing pharmacy to be a part of this patient's care.  Excell Seltzer, PharmD Clinical Pharmacist 07/31/2016,8:47 AM

## 2016-08-01 LAB — CBC
HEMATOCRIT: 27.2 % — AB (ref 36.0–46.0)
Hemoglobin: 8.8 g/dL — ABNORMAL LOW (ref 12.0–15.0)
MCH: 30.1 pg (ref 26.0–34.0)
MCHC: 32.4 g/dL (ref 30.0–36.0)
MCV: 93.2 fL (ref 78.0–100.0)
PLATELETS: 147 10*3/uL — AB (ref 150–400)
RBC: 2.92 MIL/uL — ABNORMAL LOW (ref 3.87–5.11)
RDW: 15.6 % — AB (ref 11.5–15.5)
WBC: 6.4 10*3/uL (ref 4.0–10.5)

## 2016-08-01 LAB — BASIC METABOLIC PANEL
Anion gap: 7 (ref 5–15)
BUN: 32 mg/dL — AB (ref 6–20)
CALCIUM: 9.4 mg/dL (ref 8.9–10.3)
CO2: 26 mmol/L (ref 22–32)
CREATININE: 1.09 mg/dL — AB (ref 0.44–1.00)
Chloride: 100 mmol/L — ABNORMAL LOW (ref 101–111)
GFR calc Af Amer: 54 mL/min — ABNORMAL LOW (ref >60)
GFR, EST NON AFRICAN AMERICAN: 47 mL/min — AB (ref >60)
GLUCOSE: 176 mg/dL — AB (ref 65–99)
Potassium: 3.9 mmol/L (ref 3.5–5.1)
Sodium: 133 mmol/L — ABNORMAL LOW (ref 135–145)

## 2016-08-01 LAB — GLUCOSE, CAPILLARY
Glucose-Capillary: 175 mg/dL — ABNORMAL HIGH (ref 65–99)
Glucose-Capillary: 232 mg/dL — ABNORMAL HIGH (ref 65–99)
Glucose-Capillary: 193 mg/dL — ABNORMAL HIGH (ref 65–99)
Glucose-Capillary: 230 mg/dL — ABNORMAL HIGH (ref 65–99)

## 2016-08-01 LAB — PROTIME-INR
INR: 1.58
Prothrombin Time: 19 seconds — ABNORMAL HIGH (ref 11.4–15.2)

## 2016-08-01 MED ORDER — WARFARIN SODIUM 2 MG PO TABS
2.0000 mg | ORAL_TABLET | Freq: Once | ORAL | Status: AC
  Administered 2016-08-01: 2 mg via ORAL
  Filled 2016-08-01: qty 1

## 2016-08-01 NOTE — Progress Notes (Signed)
[place] for coumadin Indication: atrial fibrillation  Allergies  Allergen Reactions  . Torsemide Nausea And Vomiting  . Macrobid [Nitrofurantoin Monohyd Macro] Rash  . Tramadol Nausea Only    Patient Measurements: Height: 5\' 2"  (157.5 cm) Weight: 136 lb 11.2 oz (62 kg) IBW/kg (Calculated) : 50.1   Vital Signs: Temp: 98 F (36.7 C) (12/23 0521) Temp Source: Oral (12/23 0521) BP: 123/89 (12/23 0521) Pulse Rate: 81 (12/23 0521)  Labs:  Recent Labs  07/29/16 2030 07/30/16 0222 07/31/16 0517 08/01/16 0635  HGB 9.3* 8.9*  --  8.8*  HCT 28.9* 27.5*  --  27.2*  PLT 171 149*  --  147*  LABPROT  --  15.3* 15.6* 19.0*  INR  --  1.20 1.23 1.58  CREATININE 1.60* 1.49*  --  1.09*  TROPONINI 0.03*  --   --   --     Estimated Creatinine Clearance: 36.3 mL/min (by C-G formula based on SCr of 1.09 mg/dL (H)).   Medical History: Past Medical History:  Diagnosis Date  . Abnormal CT of the chest    Mediastinal and hilar adenopathy followed by Dr. Koleen Nimrod in [place]  . Anemia, unspecified   . Body mass index (BMI) of 20.0-20.9 in adult FEB 2013 147 LBS  . CAD (coronary artery disease) 2011   Catheterization August 2011: mild nonobstructive coronary artery disease complicated by large left rectus sheath hematoma status post evacuation Coumadin restarted without recurrence  . Carotid artery disease ([place])    Doppler, 05/2012, 0-39% bilateral  . Cataract   . Chronic airway obstruction, not elsewhere classified   . Chronic kidney disease, stage II (mild)   . Congestive heart failure, unspecified    EF now 60%, previous nonischemic cardiomyopathy  . Diabetes mellitus   . Dilated bile duct 10/19/2011  . Diverticulitis Dec 2012  . Gastritis    By EGD  . GERD (gastroesophageal reflux disease)   . Hypercholesterolemia   . Hypertension   . Mitral regurgitation 06/27/2013  . Neurogenic sleep apnea    Uses noctural oxygen prescribed by her  pulmonologi  . Pacemaker    Single chamber [place]. Jude ACCENT [phone] SN 719 04/[birth year]/1959 10/31/2010  . Permanent atrial fibrillation (HCC)    H/o tachybradycardia syndrome on Coumadin anticoagulation, has pacemaker.  . Recurrent UTI   . Rheumatic heart disease   . Tachycardia-bradycardia [facility])    s/p St. Jude single chamber PPM 10/2010   . Unspecified hypertensive kidney disease with chronic kidney disease stage I through stage IV, or unspecified(403.90)   . Valvular heart disease    a. H/o moderate mitral stenosis by cath 2011 but no mention of this on 10/2011 echo. b. 10/2011 echo: mod MR, mild AS, mild TR; EF>65%  . Vitamin D deficiency     Medications:  Prescriptions Prior to Admission  Medication Sig Dispense Refill Last Dose  . acetaminophen (TYLENOL) 500 MG tablet Take 1,000 mg by mouth every 6 (six) hours as needed for mild pain, moderate pain or fever.    07/29/2016 at 1700  . albuterol (PROVENTIL HFA;VENTOLIN HFA) 108 (90 Base) MCG/ACT inhaler Inhale 2 puffs into the lungs every 6 (six) hours as needed for wheezing or shortness of breath. 1 Inhaler 2 07/28/2016 at Unknown time  . calcium-vitamin D 250-100 MG-UNIT tablet Take 2 tablets by mouth daily.   07/28/2016 at Unknown time  . clonazePAM (KLONOPIN) 0.5 MG tablet Take 1 tablet (0.5 mg total) by mouth 3 (three) times  daily as needed for anxiety. 90 tablet 1 07/29/2016 at Unknown time  . CREON 36000 units CPEP capsule TAKE 3 CAPSULES WITH MEALS  AND TAKE 1 CAPSULE WITH A SNACK AS DIRECTED 900 capsule 2 07/29/2016 at Unknown time  . denosumab (PROLIA) 60 MG/ML SOLN injection INJECT 60 MG SQ every 6 months. 1 mL 0 Due 08/2016  . diltiazem (CARDIZEM) 120 MG tablet TAKE 1 TABLET EVERY DAY 90 tablet 3 07/29/2016 at Unknown time  . ferrous sulfate 325 (65 FE) MG tablet Take 1 tablet (325 mg total) by mouth daily with breakfast.  3 07/29/2016 at Unknown time  . furosemide (LASIX) 40 MG tablet Take 0.5-1 tablets (20-40 mg total) by mouth daily.  (Patient taking differently: Take 20 mg by mouth daily. ) 90 tablet 1 07/29/2016 at Unknown time  . HYDROcodone-acetaminophen (NORCO) 7.5-325 MG tablet Take 1 tablet by mouth 3 (three) times daily as needed.   07/29/2016 at Unknown time  . ipratropium-albuterol (DUONEB) 0.5-2.5 (3) MG/3ML SOLN INHALE THE CONTENTS OF 1 VIAL VIA NEBULIZER EVERY 4 HOURS AS NEEDED 360 mL 1 07/29/2016 at Unknown time  . JANUVIA 50 MG tablet TAKE 1 TABLET EVERY DAY 90 tablet 1 07/28/2016 at Unknown time  . OXYGEN Inhale 2 L into the lungs at bedtime. 2lpm with sleep only    07/28/2016 at Unknown time  . pantoprazole (PROTONIX) 40 MG tablet TAKE 1 TABLET TWICE DAILY 180 tablet 1 07/29/2016 at Unknown time  . potassium chloride SA (K-DUR,KLOR-CON) 20 MEQ tablet Take 1 tablet (20 mEq total) by mouth every morning. 90 tablet 1 07/29/2016 at Unknown time  . Probiotic Product ([place]) CAPS Take 1 capsule by mouth daily.   UNKNOWN  . simvastatin (ZOCOR) 10 MG tablet TAKE 1 TABLET EVERY DAY 90 tablet 1 07/28/2016 at Unknown time  . SYMBICORT 160-4.5 MCG/ACT inhaler INHALE 2 PUFFS TWICE DAILY 3 Inhaler 1 07/29/2016 at Unknown time  . warfarin (COUMADIN) 2 MG tablet TAKE PER PHYSICIAN INSTRUCTIONS  (Patient taking differently: TAKE 2MG  ON THURSDAYS ONLY. TAKE 1MG  ON ALL OTHER DAYS) 135 tablet 1 07/28/2016 at 2000  . Blood Glucose Calibration (TRUE METRIX LEVEL 1) LOW SOLN Use weekly 3 each 1 Taking  . Blood Glucose Monitoring Suppl (TRUE METRIX AIR GLUCOSE METER) DEVI 1 Device by Does not apply route 2 (two) times daily. Use to Test blood sugar bid. DX E13.10 1 Device 0 Taking  . cephALEXin (KEFLEX) 500 MG capsule Take 1 capsule (500 mg total) by mouth 3 (three) times daily. (Patient not taking: Reported on 07/29/2016) 21 capsule 0 Not Taking at Unknown time  . nitroGLYCERIN (NITROSTAT) 0.4 MG SL tablet Place 1 tablet (0.4 mg total) under the tongue every 5 (five) minutes as needed for chest pain. 25 tablet 3 unknown   . TRUE METRIX BLOOD GLUCOSE TEST test strip TEST TWICE DAILY AS DIRECTED 200 each 2 Taking  . TRUEPLUS LANCETS 28G MISC TEST BLOOD SUGAR TWICE DAILY 200 each 2 Taking    Assessment: 81 yo lady to continue coumadin for afib.  INR remains subtherapeutic but she may have missed doses due to spinal injection. INR rising to goal  Goal of Therapy:  INR 2-3 Monitor platelets by anticoagulation protocol: Yes   Plan:  Coumadin 2 mg po today Daily PT/INR Monitor for bleeding complications  Thanks for allowing pharmacy to be a part of this patient's care.  Maeola Sarah [facility] Clinical Pharmacist

## 2016-08-01 NOTE — Progress Notes (Signed)
PROGRESS NOTE    [name]  NFA:[id number] DOB: [birth year] DOA: 07/29/2016 PCP: [name], FNP    Brief Narrative: 71 with hx of COPD on home oxygen at night, hx of permanent afib on chronic anticoagulation, hx of SSS s/p ppm, CHF, HTN, HLD, admitted for CAP.  She was given IV Zithromax and IV Rocephin, and is feeling a little better.  Her anticoagulation has been continued per pharmacy dosing.  She is also on IV steroids.    Assessment & Plan:   Principal Problem:   Bacterial pneumonia Active Problems:   Type 2 diabetes mellitus with kidney complication, without long-term current use of insulin (HCC)   Chronic anticoagulation   COPD exacerbation (HCC)   Hypoxia   1. Community-acquired pneumonia:  Continue with IV Rocephin and Zithromax. If remains stable, can consider transitioning to oral antibiotics in a.m. 2. Permanent afib:  Continue with Cardiazem and Coumadin.  She is s/p ppm. 3. DM:  Stable.  Will continue with SSI.  Note she is on IV Steroids. 4. Acute on chronic respiratory failure with hypoxia. Patient reports that she wears oxygen at night. Currently she is requiring 24 hours. Likely exacerbated by pneumonia. Continue to monitor. 5. COPD exacerbation. Currently on intravenous steroids, antibiotics and bronchodilators. Wheezing is mildly improved today. Continue pulmonary hygiene and current treatments.   DVT prophylaxis: Coumadin.  Code Status: FULL CODE.  Family Communication: None.  Disposition Plan: home when better.   Consultants:   None.   Procedures:   None.   Antimicrobials: Anti-infectives    Start     Dose/Rate Route Frequency Ordered Stop   07/30/16 2100  cefTRIAXone (ROCEPHIN) 1 g in dextrose 5 % 50 mL IVPB     1 g 100 mL/hr over 30 Minutes Intravenous Every 24 hours 07/30/16 0152     07/30/16 2100  azithromycin (ZITHROMAX) 500 mg in dextrose 5 % 250 mL IVPB     500 mg 250 mL/hr over 60 Minutes Intravenous Every 24 hours  07/30/16 0152     07/29/16 2215  cefTRIAXone (ROCEPHIN) 1 g in dextrose 5 % 50 mL IVPB     1 g 100 mL/hr over 30 Minutes Intravenous  Once 07/29/16 2203 07/29/16 2330   07/29/16 2215  azithromycin (ZITHROMAX) tablet 500 mg     500 mg Oral  Once 07/29/16 2203 07/29/16 2217   07/29/16 2145  cefTRIAXone (ROCEPHIN) 1 g in dextrose 5 % 50 mL IVPB  Status:  Discontinued     1 g 100 mL/hr over 30 Minutes Intravenous  Once 07/29/16 2138 07/29/16 2159   07/29/16 2145  doxycycline (VIBRA-TABS) tablet 100 mg  Status:  Discontinued     100 mg Oral  Once 07/29/16 2138 07/29/16 2159      Subjective:  Says she does not feel well. Having trouble expectorating sputum.  Objective: Vitals:   08/01/16 0914 08/01/16 1049 08/01/16 1433 08/01/16 1626  BP:  (!) 151/71  (!) 128/54  Pulse:    73  Resp:    20  Temp:    97.7 F (36.5 C)  TempSrc:    Oral  SpO2: 95%  97% 98%  Weight:      Height:        Intake/Output Summary (Last 24 hours) at 08/01/16 1905 Last data filed at 08/01/16 1813  Gross per 24 hour  Intake              723 ml  Output  1000 ml  Net             -277 ml   Filed Weights   07/29/16 2013 07/30/16 0000 08/01/16 0521  Weight: 60.8 kg (134 lb) 59.5 kg (131 lb 3.2 oz) 62 kg (136 lb 11.2 oz)    Examination:  General exam: Appears calm and comfortable  Respiratory system: Diminished breath sounds with bilateral wheezes. Respiratory effort normal. Cardiovascular system: S1 & S2 heard, irregular. No JVD, murmurs, rubs, gallops or clicks. No pedal edema. Gastrointestinal system: Abdomen is nondistended, soft and nontender. No organomegaly or masses felt. Normal bowel sounds heard. Central nervous system: Alert and oriented. No focal neurological deficits. Extremities: Symmetric 5 x 5 power. Skin: No rashes, lesions or ulcers Psychiatry: Judgement and insight appear normal. Mood & affect appropriate.   Data Reviewed: I have personally reviewed following labs and imaging  studies  CBC:  Recent Labs Lab 07/29/16 2030 07/30/16 0222 08/01/16 0635  WBC 6.1 5.0 6.4  NEUTROABS 4.9  --   --   HGB 9.3* 8.9* 8.8*  HCT 28.9* 27.5* 27.2*  MCV 94.1 94.2 93.2  PLT 171 149* 694*   Basic Metabolic Panel:  Recent Labs Lab 07/29/16 2030 07/30/16 0222 08/01/16 0635  NA 136 134* 133*  K 3.2* 3.1* 3.9  CL 103 101 100*  CO2 25 24 26   GLUCOSE 142* 188* 176*  BUN 21* 19 32*  CREATININE 1.60* 1.49* 1.09*  CALCIUM 9.3 9.1 9.4   GFR: Estimated Creatinine Clearance: 36.3 mL/min (by C-G formula based on SCr of 1.09 mg/dL (H)). Liver Function Tests:  Recent Labs Lab 07/29/16 2030  AST 28  ALT 11*  ALKPHOS 66  BILITOT 0.6  PROT 7.4  ALBUMIN 3.3*   Coagulation Profile:  Recent Labs Lab 07/30/16 0222 07/31/16 0517 08/01/16 0635  INR 1.20 1.23 1.58   Cardiac Enzymes:  Recent Labs Lab 07/29/16 2030  TROPONINI 0.03*   CBG:  Recent Labs Lab 07/31/16 1639 07/31/16 2138 08/01/16 0739 08/01/16 1120 08/01/16 1622  GLUCAP 216* 147* 175* 232* 193*   Lipid Profile: Sepsis Labs:  Recent Labs Lab 07/29/16 2036  LATICACIDVEN 1.6    Recent Results (from the past 240 hour(s))  Culture, blood (routine x 2)     Status: None (Preliminary result)   Collection Time: 07/29/16  8:36 PM  Result Value Ref Range Status   Specimen Description BLOOD RIGHT FOREARM  Final   Special Requests BOTTLES DRAWN AEROBIC AND ANAEROBIC 5CC EACH  Final   Culture NO GROWTH 2 DAYS  Final   Report Status PENDING  Incomplete  Culture, blood (routine x 2)     Status: None (Preliminary result)   Collection Time: 07/30/16  2:22 AM  Result Value Ref Range Status   Specimen Description BLOOD RIGHT FOREARM  Final   Special Requests BOTTLES DRAWN AEROBIC AND ANAEROBIC 10CC EACH  Final   Culture NO GROWTH 2 DAYS  Final   Report Status PENDING  Incomplete     Radiology Studies: No results found.  Scheduled Meds: . azithromycin  500 mg Intravenous Q24H  . cefTRIAXone  (ROCEPHIN)  IV  1 g Intravenous Q24H  . diltiazem  120 mg Oral Daily  . feeding supplement (ENSURE ENLIVE)  237 mL Oral BID BM  . ferrous sulfate  325 mg Oral Q breakfast  . fluticasone furoate-vilanterol  1 puff Inhalation Daily  . guaiFENesin  1,200 mg Oral BID  . insulin aspart  0-15 Units Subcutaneous TID WC  .  ipratropium-albuterol  3 mL Nebulization Q6H  . lipase/protease/amylase  36,000 Units Oral TID AC  . methylPREDNISolone (SOLU-MEDROL) injection  60 mg Intravenous Q6H  . pantoprazole  40 mg Oral BID  . saccharomyces boulardii  250 mg Oral BID  . simvastatin  10 mg Oral Daily  . sodium chloride flush  3 mL Intravenous Q12H  . Warfarin - Pharmacist Dosing Inpatient   Does not apply q1800   Continuous Infusions:   LOS: 3 days   MEMON,JEHANZEB, MD  Hospitalist.   If 7PM-7AM, please contact night-coverage [email or web address] Password [facility] 08/01/2016, 7:05 PM

## 2016-08-02 LAB — CBC
HCT: 28.7 % — ABNORMAL LOW (ref 36.0–46.0)
HEMOGLOBIN: 9.4 g/dL — AB (ref 12.0–15.0)
MCH: 30.4 pg (ref 26.0–34.0)
MCHC: 32.8 g/dL (ref 30.0–36.0)
MCV: 92.9 fL (ref 78.0–100.0)
PLATELETS: 176 10*3/uL (ref 150–400)
RBC: 3.09 MIL/uL — AB (ref 3.87–5.11)
RDW: 15.6 % — ABNORMAL HIGH (ref 11.5–15.5)
WBC: 6.6 10*3/uL (ref 4.0–10.5)

## 2016-08-02 LAB — PROTIME-INR
INR: 2.22
PROTHROMBIN TIME: 25 s — AB (ref 11.4–15.2)

## 2016-08-02 LAB — GLUCOSE, CAPILLARY
GLUCOSE-CAPILLARY: 191 mg/dL — AB (ref 65–99)
Glucose-Capillary: 202 mg/dL — ABNORMAL HIGH (ref 65–99)

## 2016-08-02 MED ORDER — PREDNISONE 10 MG PO TABS: ORAL_TABLET | ORAL | 0 refills | Status: DC

## 2016-08-02 MED ORDER — AZITHROMYCIN 250 MG PO TABS: 500.0000 mg | ORAL_TABLET | Freq: Every day | ORAL | Status: DC

## 2016-08-02 MED ORDER — WARFARIN SODIUM 1 MG PO TABS: 1.0000 mg | ORAL_TABLET | Freq: Once | ORAL | Status: DC

## 2016-08-02 MED ORDER — LEVOFLOXACIN 750 MG PO TABS: 750.0000 mg | ORAL_TABLET | Freq: Every day | ORAL | 0 refills | Status: DC

## 2016-08-02 MED ORDER — GUAIFENESIN ER 600 MG PO TB12: 600.0000 mg | ORAL_TABLET | Freq: Two times a day (BID) | ORAL | 0 refills | Status: DC

## 2016-08-02 MED ORDER — MAGNESIUM CITRATE PO SOLN
1.0000 | Freq: Once | ORAL | Status: AC
  Administered 2016-08-02: 1 via ORAL
  Filled 2016-08-02: qty 296

## 2016-08-02 NOTE — Progress Notes (Signed)
Patient's oxygen saturation on room air at rest is 91%, when ambulating on room air oxygen saturation drops to 87%. Patient's oxygen level at rest on 02 at 2 liters is 93%

## 2016-08-02 NOTE — Progress Notes (Signed)
[place] for coumadin Indication: atrial fibrillation  Allergies  Allergen Reactions  . Torsemide Nausea And Vomiting  . Macrobid [Nitrofurantoin Monohyd Macro] Rash  . Tramadol Nausea Only    Patient Measurements: Height: 5\' 2"  (157.5 cm) Weight: 136 lb 9.6 oz (62 kg) IBW/kg (Calculated) : 50.1   Vital Signs: Temp: 98.2 F (36.8 C) (12/24 0553) Temp Source: Oral (12/24 0553) BP: 149/83 (12/24 0553) Pulse Rate: 83 (12/24 0553)  Labs:  Recent Labs  07/31/16 0517 08/01/16 0635 08/02/16 0655  HGB  --  8.8* 9.4*  HCT  --  27.2* 28.7*  PLT  --  147* 176  LABPROT 15.6* 19.0* 25.0*  INR 1.23 1.58 2.22  CREATININE  --  1.09*  --     Estimated Creatinine Clearance: 36.3 mL/min (by C-G formula based on SCr of 1.09 mg/dL (H)).   Medical History: Past Medical History:  Diagnosis Date  . Abnormal CT of the chest    Mediastinal and hilar adenopathy followed by Dr. Koleen Nimrod in [place]  . Anemia, unspecified   . Body mass index (BMI) of 20.0-20.9 in adult FEB 2013 147 LBS  . CAD (coronary artery disease) 2011   Catheterization August 2011: mild nonobstructive coronary artery disease complicated by large left rectus sheath hematoma status post evacuation Coumadin restarted without recurrence  . Carotid artery disease ([place])    Doppler, 05/2012, 0-39% bilateral  . Cataract   . Chronic airway obstruction, not elsewhere classified   . Chronic kidney disease, stage II (mild)   . Congestive heart failure, unspecified    EF now 60%, previous nonischemic cardiomyopathy  . Diabetes mellitus   . Dilated bile duct 10/19/2011  . Diverticulitis Dec 2012  . Gastritis    By EGD  . GERD (gastroesophageal reflux disease)   . Hypercholesterolemia   . Hypertension   . Mitral regurgitation 06/27/2013  . Neurogenic sleep apnea    Uses noctural oxygen prescribed by her pulmonologi  . Pacemaker    Single chamber [place]. Jude ACCENT [phone] SN 719  04/11/1957 10/31/2010  . Permanent atrial fibrillation (HCC)    H/o tachybradycardia syndrome on Coumadin anticoagulation, has pacemaker.  . Recurrent UTI   . Rheumatic heart disease   . Tachycardia-bradycardia [facility])    s/p St. Jude single chamber PPM 10/2010   . Unspecified hypertensive kidney disease with chronic kidney disease stage I through stage IV, or unspecified(403.90)   . Valvular heart disease    a. H/o moderate mitral stenosis by cath 2011 but no mention of this on 10/2011 echo. b. 10/2011 echo: mod MR, mild AS, mild TR; EF>65%  . Vitamin D deficiency     Medications:  Prescriptions Prior to Admission  Medication Sig Dispense Refill Last Dose  . acetaminophen (TYLENOL) 500 MG tablet Take 1,000 mg by mouth every 6 (six) hours as needed for mild pain, moderate pain or fever.    07/29/2016 at 1700  . albuterol (PROVENTIL HFA;VENTOLIN HFA) 108 (90 Base) MCG/ACT inhaler Inhale 2 puffs into the lungs every 6 (six) hours as needed for wheezing or shortness of breath. 1 Inhaler 2 07/28/2016 at Unknown time  . calcium-vitamin D 250-100 MG-UNIT tablet Take 2 tablets by mouth daily.   07/28/2016 at Unknown time  . clonazePAM (KLONOPIN) 0.5 MG tablet Take 1 tablet (0.5 mg total) by mouth 3 (three) times daily as needed for anxiety. 90 tablet 1 07/29/2016 at Unknown time  . CREON 36000 units CPEP capsule TAKE 3 CAPSULES  WITH MEALS  AND TAKE 1 CAPSULE WITH A SNACK AS DIRECTED 900 capsule 2 07/29/2016 at Unknown time  . denosumab (PROLIA) 60 MG/ML SOLN injection INJECT 60 MG SQ every 6 months. 1 mL 0 Due 08/2016  . diltiazem (CARDIZEM) 120 MG tablet TAKE 1 TABLET EVERY DAY 90 tablet 3 07/29/2016 at Unknown time  . ferrous sulfate 325 (65 FE) MG tablet Take 1 tablet (325 mg total) by mouth daily with breakfast.  3 07/29/2016 at Unknown time  . furosemide (LASIX) 40 MG tablet Take 0.5-1 tablets (20-40 mg total) by mouth daily. (Patient taking differently: Take 20 mg by mouth daily. ) 90 tablet 1  07/29/2016 at Unknown time  . HYDROcodone-acetaminophen (NORCO) 7.5-325 MG tablet Take 1 tablet by mouth 3 (three) times daily as needed.   07/29/2016 at Unknown time  . ipratropium-albuterol (DUONEB) 0.5-2.5 (3) MG/3ML SOLN INHALE THE CONTENTS OF 1 VIAL VIA NEBULIZER EVERY 4 HOURS AS NEEDED 360 mL 1 07/29/2016 at Unknown time  . JANUVIA 50 MG tablet TAKE 1 TABLET EVERY DAY 90 tablet 1 07/28/2016 at Unknown time  . OXYGEN Inhale 2 L into the lungs at bedtime. 2lpm with sleep only    07/28/2016 at Unknown time  . pantoprazole (PROTONIX) 40 MG tablet TAKE 1 TABLET TWICE DAILY 180 tablet 1 07/29/2016 at Unknown time  . potassium chloride SA (K-DUR,KLOR-CON) 20 MEQ tablet Take 1 tablet (20 mEq total) by mouth every morning. 90 tablet 1 07/29/2016 at Unknown time  . Probiotic Product ([place]) CAPS Take 1 capsule by mouth daily.   UNKNOWN  . simvastatin (ZOCOR) 10 MG tablet TAKE 1 TABLET EVERY DAY 90 tablet 1 07/28/2016 at Unknown time  . SYMBICORT 160-4.5 MCG/ACT inhaler INHALE 2 PUFFS TWICE DAILY 3 Inhaler 1 07/29/2016 at Unknown time  . warfarin (COUMADIN) 2 MG tablet TAKE PER PHYSICIAN INSTRUCTIONS  (Patient taking differently: TAKE 2MG  ON THURSDAYS ONLY. TAKE 1MG  ON ALL OTHER DAYS) 135 tablet 1 07/28/2016 at 2000  . Blood Glucose Calibration (TRUE METRIX LEVEL 1) LOW SOLN Use weekly 3 each 1 Taking  . Blood Glucose Monitoring Suppl (TRUE METRIX AIR GLUCOSE METER) DEVI 1 Device by Does not apply route 2 (two) times daily. Use to Test blood sugar bid. DX E13.10 1 Device 0 Taking  . cephALEXin (KEFLEX) 500 MG capsule Take 1 capsule (500 mg total) by mouth 3 (three) times daily. (Patient not taking: Reported on 07/29/2016) 21 capsule 0 Not Taking at Unknown time  . nitroGLYCERIN (NITROSTAT) 0.4 MG SL tablet Place 1 tablet (0.4 mg total) under the tongue every 5 (five) minutes as needed for chest pain. 25 tablet 3 unknown  . TRUE METRIX BLOOD GLUCOSE TEST test strip TEST TWICE DAILY AS  DIRECTED 200 each 2 Taking  . TRUEPLUS LANCETS 28G MISC TEST BLOOD SUGAR TWICE DAILY 200 each 2 Taking    Assessment: 81 yo lady to continue coumadin for afib.  INR remains subtherapeutic but she may have missed doses due to spinal injection. INR therapeutic  Goal of Therapy:  INR 2-3 Monitor platelets by anticoagulation protocol: Yes   Plan:  Coumadin 1 mg po today (home dose) Daily PT/INR Monitor for bleeding complications  Thanks for allowing pharmacy to be a part of this patient's care.  Maeola Sarah [facility] Clinical Pharmacist

## 2016-08-02 NOTE — Progress Notes (Addendum)
[facility] called and informed of patient's discharge. Patient ordered RN, Aide and PT, orders and face sheet faxed. Patient informed to wear her oxygen at home at all times until she sees her MD. Patient states understanding of discharge instructions

## 2016-08-02 NOTE — Discharge Summary (Signed)
Physician Discharge Summary  [name] KKX:[id number] DOB: [birth year] DOA: 07/29/2016  PCP: [name], FNP  Admit date: 07/29/2016 Discharge date: 08/02/2016  Admitted From: home Disposition:  home  Recommendations for Outpatient Follow-up:  1. Follow up with PCP in 1-2 weeks 2. Please obtain BMP/CBC in one week   Home Health: Equipment/Devices:  Discharge Condition:stable CODE STATUS:full Diet recommendation: Heart Healthy / Carb Modified   Brief/Interim Summary: 68 with hx of COPD on home oxygen at night, hx of permanent afib on chronic anticoagulation, hx of SSS s/p ppm, CHF, HTN, HLD, admitted for CAP.  She was given IV Zithromax and IV Rocephin, and is feeling a little better.  Her anticoagulation has been continued per pharmacy dosing.  She is also on IV steroids.  Discharge Diagnoses:  Principal Problem:   Bacterial pneumonia Active Problems:   Type 2 diabetes mellitus with kidney complication, without long-term current use of insulin (HCC)   Chronic anticoagulation   COPD exacerbation (HCC)   Hypoxia  1. Community-acquired pneumonia:  Patient was treated with IV Rocephin and Zithromax. She remained afebrile and was clinically improving. She was transitioned to oral levaquin to complete her course. 2. Permanent afib:  Continued on Cardiazem and Coumadin.  She is s/p ppm. 3. DM:  Stable.  continue outpatient regimen on discharge 4. Acute on chronic respiratory failure with hypoxia. Patient reports that prior to admission, she was wearing oxygen only at night. Currently she is requiring 24 hours. Likely exacerbated by pneumonia. This can be re evaluated by pcp. 5. COPD exacerbation. Treated with intravenous steroids, antibiotics and bronchodilators. Wheezing has improved, but she still has faint wheezes, which I suspect is her baseline. She has normal respiratory effort. She will be transitioned to prednisone taper. She already has neb treatments at home.  she will be continued on pulmonary hygiene.  Discharge Instructions  Discharge Instructions    Diet - low sodium heart healthy    Complete by:  As directed    Increase activity slowly    Complete by:  As directed      Allergies as of 08/02/2016      Reactions   Torsemide Nausea And Vomiting   Macrobid [nitrofurantoin Monohyd Macro] Rash   Tramadol Nausea Only      Medication List    STOP taking these medications   cephALEXin 500 MG capsule Commonly known as:  KEFLEX     TAKE these medications   acetaminophen 500 MG tablet Commonly known as:  TYLENOL Take 1,000 mg by mouth every 6 (six) hours as needed for mild pain, moderate pain or fever.   albuterol 108 (90 Base) MCG/ACT inhaler Commonly known as:  PROVENTIL HFA;VENTOLIN HFA Inhale 2 puffs into the lungs every 6 (six) hours as needed for wheezing or shortness of breath.   calcium-vitamin D 250-100 MG-UNIT tablet Take 2 tablets by mouth daily.   clonazePAM 0.5 MG tablet Commonly known as:  KLONOPIN Take 1 tablet (0.5 mg total) by mouth 3 (three) times daily as needed for anxiety.   CREON 36000 UNITS Cpep capsule Generic drug:  lipase/protease/amylase TAKE 3 CAPSULES WITH MEALS  AND TAKE 1 CAPSULE WITH A SNACK AS DIRECTED   denosumab 60 MG/ML Soln injection Commonly known as:  PROLIA INJECT 60 MG SQ every 6 months.   diltiazem 120 MG tablet Commonly known as:  CARDIZEM TAKE 1 TABLET EVERY DAY   ferrous sulfate 325 (65 FE) MG tablet Take 1 tablet (325 mg total) by mouth daily with breakfast.  furosemide 40 MG tablet Commonly known as:  LASIX Take 0.5-1 tablets (20-40 mg total) by mouth daily. What changed:  how much to take   guaiFENesin 600 MG 12 hr tablet Commonly known as:  MUCINEX Take 1 tablet (600 mg total) by mouth 2 (two) times daily.   HYDROcodone-acetaminophen 7.5-325 MG tablet Commonly known as:  NORCO Take 1 tablet by mouth 3 (three) times daily as needed.   ipratropium-albuterol  0.5-2.5 (3) MG/3ML Soln Commonly known as:  DUONEB INHALE THE CONTENTS OF 1 VIAL VIA NEBULIZER EVERY 4 HOURS AS NEEDED   JANUVIA 50 MG tablet Generic drug:  sitaGLIPtin TAKE 1 TABLET EVERY DAY   levofloxacin 750 MG tablet Commonly known as:  LEVAQUIN Take 1 tablet (750 mg total) by mouth daily.   nitroGLYCERIN 0.4 MG SL tablet Commonly known as:  NITROSTAT Place 1 tablet (0.4 mg total) under the tongue every 5 (five) minutes as needed for chest pain.   OXYGEN Inhale 2 L into the lungs at bedtime. 2lpm with sleep only   pantoprazole 40 MG tablet Commonly known as:  PROTONIX TAKE 1 TABLET TWICE DAILY   PHILLIPS COLON HEALTH Caps Take 1 capsule by mouth daily.   potassium chloride SA 20 MEQ tablet Commonly known as:  K-DUR,KLOR-CON Take 1 tablet (20 mEq total) by mouth every morning.   predniSONE 10 MG tablet Commonly known as:  DELTASONE Take 40mg  po daily for 2 days then 30mg  daily for 2 days then 20mg  daily for 2 days then 10mg  daily for 2 days then stop   simvastatin 10 MG tablet Commonly known as:  ZOCOR TAKE 1 TABLET EVERY DAY   SYMBICORT 160-4.5 MCG/ACT inhaler Generic drug:  budesonide-formoterol INHALE 2 PUFFS TWICE DAILY   TRUE METRIX AIR GLUCOSE METER Devi 1 Device by Does not apply route 2 (two) times daily. Use to Test blood sugar bid. DX E13.10   TRUE METRIX BLOOD GLUCOSE TEST test strip Generic drug:  glucose blood TEST TWICE DAILY AS DIRECTED   TRUE METRIX LEVEL 1 Low Soln Use weekly   TRUEPLUS LANCETS 28G Misc TEST BLOOD SUGAR TWICE DAILY   warfarin 2 MG tablet Commonly known as:  COUMADIN TAKE PER PHYSICIAN INSTRUCTIONS What changed:  See the new instructions.       Allergies  Allergen Reactions  . Torsemide Nausea And Vomiting  . Macrobid [Nitrofurantoin Monohyd Macro] Rash  . Tramadol Nausea Only    Consultations:     Procedures/Studies: Dg Chest 2 View  Result Date: 07/29/2016 CLINICAL DATA:  81 year old female with  shortness of breath and productive cough EXAM: CHEST  2 VIEW COMPARISON:  Chest radiograph dated 06/18/2016 FINDINGS: Two views of the chest demonstrate an area of hazy density at the left lung base which may represent atelectatic changes but is concerning for developing pneumonia. Clinical correlation and follow-up to resolution is recommended. Trace left pleural effusion noted. The right lung is clear. There is no pneumothorax. Stable cardiac silhouette. Osteopenia. Stable benign-appearing chondroid lesion at the right humeral head. No acute fracture. IMPRESSION: Left lower lobe atelectasis versus infiltrate. Clinical correlation and follow-up recommended. Probable small left pleural effusion. Electronically Signed   By: Anner Crete M.D.   On: 07/29/2016 21:42        Subjective: Has some abdominal discomfort, no BM in 3 days, breathing feels the same  Discharge Exam: Vitals:   08/02/16 0553 08/02/16 1330  BP: (!) 149/83 133/73  Pulse: 83 85  Resp: 20 (!) 24  Temp:  98.2 F (36.8 C)    Vitals:   08/01/16 2011 08/02/16 0553 08/02/16 0743 08/02/16 1330  BP:  (!) 149/83  133/73  Pulse:  83  85  Resp:  20  (!) 24  Temp:  98.2 F (36.8 C)    TempSrc:  Oral    SpO2: 94% 99% 96% 96%  Weight:  62 kg (136 lb 9.6 oz)    Height:        General: Pt is alert, awake, not in acute distress Cardiovascular: RRR, S1/S2 +, no rubs, no gallops Respiratory: mild wheeze bilaterally, normal resp effort, no rhonchi Abdominal: Soft, NT, ND, bowel sounds + Extremities: no edema, no cyanosis    The results of significant diagnostics from this hospitalization (including imaging, microbiology, ancillary and laboratory) are listed below for reference.     Microbiology: Recent Results (from the past 240 hour(s))  Culture, blood (routine x 2)     Status: None (Preliminary result)   Collection Time: 07/29/16  8:36 PM  Result Value Ref Range Status   Specimen Description BLOOD RIGHT FOREARM  Final    Special Requests BOTTLES DRAWN AEROBIC AND ANAEROBIC 5CC EACH  Final   Culture NO GROWTH 3 DAYS  Final   Report Status PENDING  Incomplete  Culture, blood (routine x 2)     Status: None (Preliminary result)   Collection Time: 07/30/16  2:22 AM  Result Value Ref Range Status   Specimen Description BLOOD RIGHT FOREARM  Final   Special Requests BOTTLES DRAWN AEROBIC AND ANAEROBIC 10CC EACH  Final   Culture NO GROWTH 3 DAYS  Final   Report Status PENDING  Incomplete     Labs: BNP (last 3 results)  Recent Labs  07/29/16 2037  BNP 151.7*   Basic Metabolic Panel:  Recent Labs Lab 07/29/16 2030 07/30/16 0222 08/01/16 0635  NA 136 134* 133*  K 3.2* 3.1* 3.9  CL 103 101 100*  CO2 25 24 26   GLUCOSE 142* 188* 176*  BUN 21* 19 32*  CREATININE 1.60* 1.49* 1.09*  CALCIUM 9.3 9.1 9.4   Liver Function Tests:  Recent Labs Lab 07/29/16 2030  AST 28  ALT 11*  ALKPHOS 66  BILITOT 0.6  PROT 7.4  ALBUMIN 3.3*   No results for input(s): LIPASE, AMYLASE in the last 168 hours. No results for input(s): AMMONIA in the last 168 hours. CBC:  Recent Labs Lab 07/29/16 2030 07/30/16 0222 08/01/16 0635 08/02/16 0655  WBC 6.1 5.0 6.4 6.6  NEUTROABS 4.9  --   --   --   HGB 9.3* 8.9* 8.8* 9.4*  HCT 28.9* 27.5* 27.2* 28.7*  MCV 94.1 94.2 93.2 92.9  PLT 171 149* 147* 176   Cardiac Enzymes:  Recent Labs Lab 07/29/16 2030  TROPONINI 0.03*   BNP: Invalid input(s): POCBNP CBG:  Recent Labs Lab 08/01/16 1120 08/01/16 1622 08/01/16 2219 08/02/16 0733 08/02/16 1114  GLUCAP 232* 193* 230* 191* 202*   D-Dimer No results for input(s): DDIMER in the last 72 hours. Hgb A1c No results for input(s): HGBA1C in the last 72 hours. Lipid Profile No results for input(s): CHOL, HDL, LDLCALC, TRIG, CHOLHDL, LDLDIRECT in the last 72 hours. Thyroid function studies No results for input(s): TSH, T4TOTAL, T3FREE, THYROIDAB in the last 72 hours.  Invalid input(s): FREET3 Anemia work  up No results for input(s): VITAMINB12, FOLATE, FERRITIN, TIBC, IRON, RETICCTPCT in the last 72 hours. Urinalysis    Component Value Date/Time   COLORURINE YELLOW 11/25/2015 2255  APPEARANCEUR CLEAR 11/25/2015 2255   APPEARANCEUR Cloudy (A) 04/25/2013 1404   LABSPEC 1.010 11/25/2015 2255   PHURINE 7.0 11/25/2015 2255   GLUCOSEU NEGATIVE 11/25/2015 2255   HGBUR NEGATIVE 11/25/2015 2255   BILIRUBINUR NEGATIVE 11/25/2015 2255   BILIRUBINUR neg 02/28/2015 1602   BILIRUBINUR Negative 04/25/2013 1404   KETONESUR NEGATIVE 11/25/2015 2255   PROTEINUR NEGATIVE 11/25/2015 2255   UROBILINOGEN negative 02/28/2015 1602   UROBILINOGEN 0.2 08/21/2013 1922   NITRITE NEGATIVE 11/25/2015 2255   LEUKOCYTESUR NEGATIVE 11/25/2015 2255   LEUKOCYTESUR 2+ (A) 04/25/2013 1404   Sepsis Labs Invalid input(s): PROCALCITONIN,  WBC,  LACTICIDVEN Microbiology Recent Results (from the past 240 hour(s))  Culture, blood (routine x 2)     Status: None (Preliminary result)   Collection Time: 07/29/16  8:36 PM  Result Value Ref Range Status   Specimen Description BLOOD RIGHT FOREARM  Final   Special Requests BOTTLES DRAWN AEROBIC AND ANAEROBIC 5CC EACH  Final   Culture NO GROWTH 3 DAYS  Final   Report Status PENDING  Incomplete  Culture, blood (routine x 2)     Status: None (Preliminary result)   Collection Time: 07/30/16  2:22 AM  Result Value Ref Range Status   Specimen Description BLOOD RIGHT FOREARM  Final   Special Requests BOTTLES DRAWN AEROBIC AND ANAEROBIC 10CC EACH  Final   Culture NO GROWTH 3 DAYS  Final   Report Status PENDING  Incomplete     Time coordinating discharge: Over 30 minutes  SIGNED:   Kathie Dike, MD  [facility] 08/02/2016, 1:53 PM Pager   If 7PM-7AM, please contact night-coverage [email or web address] Password [email or web address]

## 2016-08-02 NOTE — Progress Notes (Signed)
PHARMACIST - PHYSICIAN COMMUNICATION DR:    CONCERNING: Antibiotic IV to Oral Route Change Policy  RECOMMENDATION: This patient is receiving Azithromycin  by the intravenous route.  Based on criteria approved by the Pharmacy and Therapeutics Committee, the antibiotic(s) is/are being converted to the equivalent oral dose form(s).   DESCRIPTION: These criteria include:  Patient being treated for a respiratory tract infection, urinary tract infection, cellulitis or clostridium difficile associated diarrhea if on metronidazole  The patient is not neutropenic and does not exhibit a GI malabsorption state  The patient is eating (either orally or via tube) and/or has been taking other orally administered medications for a least 24 hours  The patient is improving clinically and has a Tmax < 100.5  If you have questions about this conversion, please contact the Pharmacy Department  [x]  ( [phone] )  [place] []  ( [phone] )  [facility] []  ( [phone]  [place] []  ( [phone] )  [facility] []  ( [phone] )  [facility]  

## 2016-08-03 DIAGNOSIS — J441 Chronic obstructive pulmonary disease with (acute) exacerbation: Secondary | ICD-10-CM | POA: Diagnosis not present

## 2016-08-03 DIAGNOSIS — E1122 Type 2 diabetes mellitus with diabetic chronic kidney disease: Secondary | ICD-10-CM | POA: Diagnosis not present

## 2016-08-03 DIAGNOSIS — J159 Unspecified bacterial pneumonia: Secondary | ICD-10-CM | POA: Diagnosis not present

## 2016-08-03 DIAGNOSIS — I130 Hypertensive heart and chronic kidney disease with heart failure and stage 1 through stage 4 chronic kidney disease, or unspecified chronic kidney disease: Secondary | ICD-10-CM | POA: Diagnosis not present

## 2016-08-03 DIAGNOSIS — I509 Heart failure, unspecified: Secondary | ICD-10-CM | POA: Diagnosis not present

## 2016-08-03 DIAGNOSIS — N182 Chronic kidney disease, stage 2 (mild): Secondary | ICD-10-CM | POA: Diagnosis not present

## 2016-08-03 DIAGNOSIS — I2510 Atherosclerotic heart disease of native coronary artery without angina pectoris: Secondary | ICD-10-CM | POA: Diagnosis not present

## 2016-08-03 DIAGNOSIS — D649 Anemia, unspecified: Secondary | ICD-10-CM | POA: Diagnosis not present

## 2016-08-03 DIAGNOSIS — J440 Chronic obstructive pulmonary disease with acute lower respiratory infection: Secondary | ICD-10-CM | POA: Diagnosis not present

## 2016-08-04 ENCOUNTER — Ambulatory Visit: Payer: Self-pay | Admitting: Nurse Practitioner

## 2016-08-04 LAB — CULTURE, BLOOD (ROUTINE X 2)
CULTURE: NO GROWTH
Culture: NO GROWTH

## 2016-08-05 ENCOUNTER — Other Ambulatory Visit: Payer: Self-pay | Admitting: Nurse Practitioner

## 2016-08-06 DIAGNOSIS — E1122 Type 2 diabetes mellitus with diabetic chronic kidney disease: Secondary | ICD-10-CM | POA: Diagnosis not present

## 2016-08-06 DIAGNOSIS — I2510 Atherosclerotic heart disease of native coronary artery without angina pectoris: Secondary | ICD-10-CM | POA: Diagnosis not present

## 2016-08-06 DIAGNOSIS — D649 Anemia, unspecified: Secondary | ICD-10-CM | POA: Diagnosis not present

## 2016-08-06 DIAGNOSIS — J440 Chronic obstructive pulmonary disease with acute lower respiratory infection: Secondary | ICD-10-CM | POA: Diagnosis not present

## 2016-08-06 DIAGNOSIS — J441 Chronic obstructive pulmonary disease with (acute) exacerbation: Secondary | ICD-10-CM | POA: Diagnosis not present

## 2016-08-06 DIAGNOSIS — I509 Heart failure, unspecified: Secondary | ICD-10-CM | POA: Diagnosis not present

## 2016-08-06 DIAGNOSIS — J159 Unspecified bacterial pneumonia: Secondary | ICD-10-CM | POA: Diagnosis not present

## 2016-08-06 DIAGNOSIS — N182 Chronic kidney disease, stage 2 (mild): Secondary | ICD-10-CM | POA: Diagnosis not present

## 2016-08-06 DIAGNOSIS — I130 Hypertensive heart and chronic kidney disease with heart failure and stage 1 through stage 4 chronic kidney disease, or unspecified chronic kidney disease: Secondary | ICD-10-CM | POA: Diagnosis not present

## 2016-08-08 DIAGNOSIS — J449 Chronic obstructive pulmonary disease, unspecified: Secondary | ICD-10-CM | POA: Diagnosis not present

## 2016-08-08 DIAGNOSIS — J188 Other pneumonia, unspecified organism: Secondary | ICD-10-CM | POA: Diagnosis not present

## 2016-08-11 ENCOUNTER — Ambulatory Visit (INDEPENDENT_AMBULATORY_CARE_PROVIDER_SITE_OTHER): Payer: Commercial Managed Care - HMO | Admitting: Pharmacist

## 2016-08-11 ENCOUNTER — Telehealth: Payer: Self-pay | Admitting: *Deleted

## 2016-08-11 DIAGNOSIS — I130 Hypertensive heart and chronic kidney disease with heart failure and stage 1 through stage 4 chronic kidney disease, or unspecified chronic kidney disease: Secondary | ICD-10-CM | POA: Diagnosis not present

## 2016-08-11 DIAGNOSIS — I509 Heart failure, unspecified: Secondary | ICD-10-CM | POA: Diagnosis not present

## 2016-08-11 DIAGNOSIS — I2510 Atherosclerotic heart disease of native coronary artery without angina pectoris: Secondary | ICD-10-CM | POA: Diagnosis not present

## 2016-08-11 DIAGNOSIS — I482 Chronic atrial fibrillation: Secondary | ICD-10-CM

## 2016-08-11 DIAGNOSIS — I4821 Permanent atrial fibrillation: Secondary | ICD-10-CM

## 2016-08-11 DIAGNOSIS — N182 Chronic kidney disease, stage 2 (mild): Secondary | ICD-10-CM | POA: Diagnosis not present

## 2016-08-11 DIAGNOSIS — J441 Chronic obstructive pulmonary disease with (acute) exacerbation: Secondary | ICD-10-CM | POA: Diagnosis not present

## 2016-08-11 DIAGNOSIS — E1122 Type 2 diabetes mellitus with diabetic chronic kidney disease: Secondary | ICD-10-CM | POA: Diagnosis not present

## 2016-08-11 DIAGNOSIS — D649 Anemia, unspecified: Secondary | ICD-10-CM | POA: Diagnosis not present

## 2016-08-11 DIAGNOSIS — J159 Unspecified bacterial pneumonia: Secondary | ICD-10-CM | POA: Diagnosis not present

## 2016-08-11 DIAGNOSIS — J440 Chronic obstructive pulmonary disease with acute lower respiratory infection: Secondary | ICD-10-CM | POA: Diagnosis not present

## 2016-08-11 LAB — POCT INR: INR: 4.2

## 2016-08-11 NOTE — Telephone Encounter (Signed)
INR is supertherapeutic.  Likely related to prednisone given for pneumonia.  Recommend no warfarin for next 2 days, then decrease warfarin dose to 1mg  or 1/2 tablet daily until recheck INR Friday, January 5th, 2018.  Patient and Otila Kluver with [facility] notified.

## 2016-08-11 NOTE — Telephone Encounter (Signed)
INR 4.2

## 2016-08-12 DIAGNOSIS — I2510 Atherosclerotic heart disease of native coronary artery without angina pectoris: Secondary | ICD-10-CM | POA: Diagnosis not present

## 2016-08-12 DIAGNOSIS — N182 Chronic kidney disease, stage 2 (mild): Secondary | ICD-10-CM | POA: Diagnosis not present

## 2016-08-12 DIAGNOSIS — J159 Unspecified bacterial pneumonia: Secondary | ICD-10-CM | POA: Diagnosis not present

## 2016-08-12 DIAGNOSIS — J440 Chronic obstructive pulmonary disease with acute lower respiratory infection: Secondary | ICD-10-CM | POA: Diagnosis not present

## 2016-08-12 DIAGNOSIS — E1122 Type 2 diabetes mellitus with diabetic chronic kidney disease: Secondary | ICD-10-CM | POA: Diagnosis not present

## 2016-08-12 DIAGNOSIS — J441 Chronic obstructive pulmonary disease with (acute) exacerbation: Secondary | ICD-10-CM | POA: Diagnosis not present

## 2016-08-12 DIAGNOSIS — D649 Anemia, unspecified: Secondary | ICD-10-CM | POA: Diagnosis not present

## 2016-08-12 DIAGNOSIS — I509 Heart failure, unspecified: Secondary | ICD-10-CM | POA: Diagnosis not present

## 2016-08-12 DIAGNOSIS — I130 Hypertensive heart and chronic kidney disease with heart failure and stage 1 through stage 4 chronic kidney disease, or unspecified chronic kidney disease: Secondary | ICD-10-CM | POA: Diagnosis not present

## 2016-08-14 ENCOUNTER — Encounter: Payer: Self-pay | Admitting: Nurse Practitioner

## 2016-08-14 ENCOUNTER — Ambulatory Visit (INDEPENDENT_AMBULATORY_CARE_PROVIDER_SITE_OTHER): Payer: Commercial Managed Care - HMO | Admitting: Nurse Practitioner

## 2016-08-14 VITALS — BP 101/61 | HR 59 | Temp 97.0°F | Ht 62.0 in | Wt 129.0 lb

## 2016-08-14 DIAGNOSIS — J441 Chronic obstructive pulmonary disease with (acute) exacerbation: Secondary | ICD-10-CM | POA: Diagnosis not present

## 2016-08-14 DIAGNOSIS — N182 Chronic kidney disease, stage 2 (mild): Secondary | ICD-10-CM | POA: Diagnosis not present

## 2016-08-14 DIAGNOSIS — I482 Chronic atrial fibrillation: Secondary | ICD-10-CM | POA: Diagnosis not present

## 2016-08-14 DIAGNOSIS — I4821 Permanent atrial fibrillation: Secondary | ICD-10-CM

## 2016-08-14 DIAGNOSIS — I2510 Atherosclerotic heart disease of native coronary artery without angina pectoris: Secondary | ICD-10-CM | POA: Diagnosis not present

## 2016-08-14 DIAGNOSIS — D649 Anemia, unspecified: Secondary | ICD-10-CM | POA: Diagnosis not present

## 2016-08-14 DIAGNOSIS — J159 Unspecified bacterial pneumonia: Secondary | ICD-10-CM | POA: Diagnosis not present

## 2016-08-14 DIAGNOSIS — E1122 Type 2 diabetes mellitus with diabetic chronic kidney disease: Secondary | ICD-10-CM | POA: Diagnosis not present

## 2016-08-14 DIAGNOSIS — Z09 Encounter for follow-up examination after completed treatment for conditions other than malignant neoplasm: Secondary | ICD-10-CM

## 2016-08-14 DIAGNOSIS — I130 Hypertensive heart and chronic kidney disease with heart failure and stage 1 through stage 4 chronic kidney disease, or unspecified chronic kidney disease: Secondary | ICD-10-CM | POA: Diagnosis not present

## 2016-08-14 DIAGNOSIS — J440 Chronic obstructive pulmonary disease with acute lower respiratory infection: Secondary | ICD-10-CM | POA: Diagnosis not present

## 2016-08-14 DIAGNOSIS — I509 Heart failure, unspecified: Secondary | ICD-10-CM | POA: Diagnosis not present

## 2016-08-14 LAB — COAGUCHEK XS/INR WAIVED
INR: 2.1 — ABNORMAL HIGH (ref 0.9–1.1)
PROTHROMBIN TIME: 24.8 s

## 2016-08-14 NOTE — Progress Notes (Signed)
   Subjective:    Patient ID: [name], female    DOB: 1934-12-02, 82 y.o.   MRN: [id number]  HPI Patient was in hospital with diagnosis of pneumonia December 21-24,2018. SHe was given Iv antibiotics and was discharged on antibiotics. SHe is doing much better. Today denies SOB or cough.   * [facility] health did her INR on January 2,2018 and was 4.2- was addressed by clinical pharmacist and was told to repeat today.    Review of Systems  Constitutional: Negative.   HENT: Positive for congestion (slight).   Respiratory: Positive for cough (slight).   Cardiovascular: Negative.   Gastrointestinal: Negative.   Genitourinary: Negative.   Musculoskeletal: Negative.   Neurological: Negative.   Psychiatric/Behavioral: Negative.   All other systems reviewed and are negative.      Objective:   Physical Exam  Constitutional: She is oriented to person, place, and time. She appears well-developed and well-nourished. No distress.  Pulmonary/Chest: Effort normal. She has rales (crackles throughout).  Deep wet cough  Abdominal: Soft.  Neurological: She is alert and oriented to person, place, and time.  Skin: Skin is warm.  Psychiatric: She has a normal mood and affect. Her behavior is normal. Judgment and thought content normal.    BP 101/61   Pulse (!) 59   Temp 97 F (36.1 C) (Oral)   Ht 5\' 2"  (1.575 m)   Wt 129 lb (58.5 kg)   BMI 23.59 kg/m   INR 2.1     Assessment & Plan:  1. Permanent atrial fibrillation ([place]) See anticoag documentation recheck in 4 weeks - CoaguChek XS/INR Waived  2. Hospital discharge follow-up Hospital records reviewed Continue breathing exercises Follow up in 1 month   Tar [place]el, FNP

## 2016-08-14 NOTE — Patient Instructions (Signed)
Anticoagulation Dose Instructions as of 08/14/2016      [name] Tue Wed Thu Fri Sat   New Dose 1 mg 1 mg 1 mg 1 mg 2 mg 1 mg 1 mg   Alt Week 1 mg 1 mg 1 mg 1 mg 2 mg 1 mg 1 mg    Description   1/2 tablet daily except on Thursday a whole tablet    Follow in 1 month

## 2016-08-17 ENCOUNTER — Other Ambulatory Visit: Payer: Self-pay | Admitting: Pharmacist

## 2016-08-17 DIAGNOSIS — M545 Low back pain: Secondary | ICD-10-CM | POA: Diagnosis not present

## 2016-08-17 DIAGNOSIS — M47817 Spondylosis without myelopathy or radiculopathy, lumbosacral region: Secondary | ICD-10-CM | POA: Diagnosis not present

## 2016-08-17 MED ORDER — DENOSUMAB 60 MG/ML ~~LOC~~ SOLN: SUBCUTANEOUS | 0 refills | Status: DC

## 2016-08-18 ENCOUNTER — Telehealth: Payer: Self-pay

## 2016-08-18 ENCOUNTER — Ambulatory Visit (INDEPENDENT_AMBULATORY_CARE_PROVIDER_SITE_OTHER): Payer: Self-pay | Admitting: Pharmacist

## 2016-08-18 ENCOUNTER — Other Ambulatory Visit: Payer: Self-pay | Admitting: Nurse Practitioner

## 2016-08-18 DIAGNOSIS — I482 Chronic atrial fibrillation: Secondary | ICD-10-CM

## 2016-08-18 DIAGNOSIS — I2510 Atherosclerotic heart disease of native coronary artery without angina pectoris: Secondary | ICD-10-CM | POA: Diagnosis not present

## 2016-08-18 DIAGNOSIS — I130 Hypertensive heart and chronic kidney disease with heart failure and stage 1 through stage 4 chronic kidney disease, or unspecified chronic kidney disease: Secondary | ICD-10-CM | POA: Diagnosis not present

## 2016-08-18 DIAGNOSIS — J159 Unspecified bacterial pneumonia: Secondary | ICD-10-CM | POA: Diagnosis not present

## 2016-08-18 DIAGNOSIS — I509 Heart failure, unspecified: Secondary | ICD-10-CM | POA: Diagnosis not present

## 2016-08-18 DIAGNOSIS — D649 Anemia, unspecified: Secondary | ICD-10-CM | POA: Diagnosis not present

## 2016-08-18 DIAGNOSIS — N182 Chronic kidney disease, stage 2 (mild): Secondary | ICD-10-CM | POA: Diagnosis not present

## 2016-08-18 DIAGNOSIS — J440 Chronic obstructive pulmonary disease with acute lower respiratory infection: Secondary | ICD-10-CM | POA: Diagnosis not present

## 2016-08-18 DIAGNOSIS — E1122 Type 2 diabetes mellitus with diabetic chronic kidney disease: Secondary | ICD-10-CM | POA: Diagnosis not present

## 2016-08-18 DIAGNOSIS — I4821 Permanent atrial fibrillation: Secondary | ICD-10-CM

## 2016-08-18 DIAGNOSIS — J441 Chronic obstructive pulmonary disease with (acute) exacerbation: Secondary | ICD-10-CM | POA: Diagnosis not present

## 2016-08-18 LAB — POCT INR: INR: 1.3

## 2016-08-18 NOTE — Telephone Encounter (Signed)
[name]  You 5 hours ago (11:31 AM)    INR 1.3 PT 15.9 Coumadin held Thurs., Fri., Sat., and Sunday Took 1/2 of 2 mg last night (Routing comment)     Patient has epidural in spine yesterday 08/17/16.  Restarted warfarin last night.   Recommend she take 1 tablet today and tomorrow and then restart 1/2 tablet daily Recheck INR Friday, January 12th.

## 2016-08-20 DIAGNOSIS — J159 Unspecified bacterial pneumonia: Secondary | ICD-10-CM | POA: Diagnosis not present

## 2016-08-20 DIAGNOSIS — D649 Anemia, unspecified: Secondary | ICD-10-CM | POA: Diagnosis not present

## 2016-08-20 DIAGNOSIS — I130 Hypertensive heart and chronic kidney disease with heart failure and stage 1 through stage 4 chronic kidney disease, or unspecified chronic kidney disease: Secondary | ICD-10-CM | POA: Diagnosis not present

## 2016-08-20 DIAGNOSIS — I509 Heart failure, unspecified: Secondary | ICD-10-CM | POA: Diagnosis not present

## 2016-08-20 DIAGNOSIS — J441 Chronic obstructive pulmonary disease with (acute) exacerbation: Secondary | ICD-10-CM | POA: Diagnosis not present

## 2016-08-20 DIAGNOSIS — E1122 Type 2 diabetes mellitus with diabetic chronic kidney disease: Secondary | ICD-10-CM | POA: Diagnosis not present

## 2016-08-20 DIAGNOSIS — I2510 Atherosclerotic heart disease of native coronary artery without angina pectoris: Secondary | ICD-10-CM | POA: Diagnosis not present

## 2016-08-20 DIAGNOSIS — N182 Chronic kidney disease, stage 2 (mild): Secondary | ICD-10-CM | POA: Diagnosis not present

## 2016-08-20 DIAGNOSIS — J440 Chronic obstructive pulmonary disease with acute lower respiratory infection: Secondary | ICD-10-CM | POA: Diagnosis not present

## 2016-08-21 ENCOUNTER — Ambulatory Visit (INDEPENDENT_AMBULATORY_CARE_PROVIDER_SITE_OTHER): Payer: Self-pay | Admitting: Pharmacist

## 2016-08-21 ENCOUNTER — Telehealth: Payer: Self-pay | Admitting: *Deleted

## 2016-08-21 DIAGNOSIS — J159 Unspecified bacterial pneumonia: Secondary | ICD-10-CM | POA: Diagnosis not present

## 2016-08-21 DIAGNOSIS — J441 Chronic obstructive pulmonary disease with (acute) exacerbation: Secondary | ICD-10-CM | POA: Diagnosis not present

## 2016-08-21 DIAGNOSIS — I482 Chronic atrial fibrillation: Secondary | ICD-10-CM

## 2016-08-21 DIAGNOSIS — I509 Heart failure, unspecified: Secondary | ICD-10-CM | POA: Diagnosis not present

## 2016-08-21 DIAGNOSIS — J440 Chronic obstructive pulmonary disease with acute lower respiratory infection: Secondary | ICD-10-CM | POA: Diagnosis not present

## 2016-08-21 DIAGNOSIS — I2510 Atherosclerotic heart disease of native coronary artery without angina pectoris: Secondary | ICD-10-CM | POA: Diagnosis not present

## 2016-08-21 DIAGNOSIS — I130 Hypertensive heart and chronic kidney disease with heart failure and stage 1 through stage 4 chronic kidney disease, or unspecified chronic kidney disease: Secondary | ICD-10-CM | POA: Diagnosis not present

## 2016-08-21 DIAGNOSIS — D649 Anemia, unspecified: Secondary | ICD-10-CM | POA: Diagnosis not present

## 2016-08-21 DIAGNOSIS — I4821 Permanent atrial fibrillation: Secondary | ICD-10-CM

## 2016-08-21 DIAGNOSIS — N182 Chronic kidney disease, stage 2 (mild): Secondary | ICD-10-CM | POA: Diagnosis not present

## 2016-08-21 DIAGNOSIS — E1122 Type 2 diabetes mellitus with diabetic chronic kidney disease: Secondary | ICD-10-CM | POA: Diagnosis not present

## 2016-08-21 LAB — POCT INR: INR: 2.2

## 2016-08-21 NOTE — Telephone Encounter (Signed)
Per [facility] INR 2.2 PT 26.9 Pt took Warfarin 2mg  Tues and Weds 1mg  On Thurs Please review and advise

## 2016-08-21 NOTE — Telephone Encounter (Signed)
Recommend continue recent change in warfarin 2mg  tablets - take 1/2 tablet daily.  [facility] will check INR in 4 to 5 days.  Cathy with [facility] notified of above and patient notified.

## 2016-08-22 ENCOUNTER — Other Ambulatory Visit: Payer: Self-pay | Admitting: Nurse Practitioner

## 2016-08-24 DIAGNOSIS — I2510 Atherosclerotic heart disease of native coronary artery without angina pectoris: Secondary | ICD-10-CM | POA: Diagnosis not present

## 2016-08-24 DIAGNOSIS — N182 Chronic kidney disease, stage 2 (mild): Secondary | ICD-10-CM | POA: Diagnosis not present

## 2016-08-24 DIAGNOSIS — J159 Unspecified bacterial pneumonia: Secondary | ICD-10-CM | POA: Diagnosis not present

## 2016-08-24 DIAGNOSIS — J441 Chronic obstructive pulmonary disease with (acute) exacerbation: Secondary | ICD-10-CM | POA: Diagnosis not present

## 2016-08-24 DIAGNOSIS — D649 Anemia, unspecified: Secondary | ICD-10-CM | POA: Diagnosis not present

## 2016-08-24 DIAGNOSIS — I509 Heart failure, unspecified: Secondary | ICD-10-CM | POA: Diagnosis not present

## 2016-08-24 DIAGNOSIS — J440 Chronic obstructive pulmonary disease with acute lower respiratory infection: Secondary | ICD-10-CM | POA: Diagnosis not present

## 2016-08-24 DIAGNOSIS — I130 Hypertensive heart and chronic kidney disease with heart failure and stage 1 through stage 4 chronic kidney disease, or unspecified chronic kidney disease: Secondary | ICD-10-CM | POA: Diagnosis not present

## 2016-08-24 DIAGNOSIS — E1122 Type 2 diabetes mellitus with diabetic chronic kidney disease: Secondary | ICD-10-CM | POA: Diagnosis not present

## 2016-08-26 ENCOUNTER — Other Ambulatory Visit: Payer: Self-pay | Admitting: Nurse Practitioner

## 2016-08-26 NOTE — Telephone Encounter (Signed)
Last filled 07/27/16, last seen 08/14/16. Call in

## 2016-08-28 ENCOUNTER — Telehealth: Payer: Self-pay

## 2016-08-28 DIAGNOSIS — N182 Chronic kidney disease, stage 2 (mild): Secondary | ICD-10-CM | POA: Diagnosis not present

## 2016-08-28 DIAGNOSIS — D649 Anemia, unspecified: Secondary | ICD-10-CM | POA: Diagnosis not present

## 2016-08-28 DIAGNOSIS — E1122 Type 2 diabetes mellitus with diabetic chronic kidney disease: Secondary | ICD-10-CM | POA: Diagnosis not present

## 2016-08-28 DIAGNOSIS — J159 Unspecified bacterial pneumonia: Secondary | ICD-10-CM | POA: Diagnosis not present

## 2016-08-28 DIAGNOSIS — I130 Hypertensive heart and chronic kidney disease with heart failure and stage 1 through stage 4 chronic kidney disease, or unspecified chronic kidney disease: Secondary | ICD-10-CM | POA: Diagnosis not present

## 2016-08-28 DIAGNOSIS — J441 Chronic obstructive pulmonary disease with (acute) exacerbation: Secondary | ICD-10-CM | POA: Diagnosis not present

## 2016-08-28 DIAGNOSIS — J440 Chronic obstructive pulmonary disease with acute lower respiratory infection: Secondary | ICD-10-CM | POA: Diagnosis not present

## 2016-08-28 DIAGNOSIS — I2510 Atherosclerotic heart disease of native coronary artery without angina pectoris: Secondary | ICD-10-CM | POA: Diagnosis not present

## 2016-08-28 DIAGNOSIS — I509 Heart failure, unspecified: Secondary | ICD-10-CM | POA: Diagnosis not present

## 2016-08-28 LAB — POCT INR: INR: 2.8

## 2016-08-28 NOTE — Telephone Encounter (Signed)
Please call in klonopin with 1 refills 

## 2016-08-31 ENCOUNTER — Ambulatory Visit (INDEPENDENT_AMBULATORY_CARE_PROVIDER_SITE_OTHER): Payer: Self-pay | Admitting: Pharmacist

## 2016-08-31 DIAGNOSIS — I4821 Permanent atrial fibrillation: Secondary | ICD-10-CM

## 2016-08-31 DIAGNOSIS — I482 Chronic atrial fibrillation: Secondary | ICD-10-CM

## 2016-08-31 NOTE — Telephone Encounter (Signed)
INR is at goal. Continue current dose.  Recommend recheck protime this week.  Tye Maryland called to confirm that protime visit is scheduled.

## 2016-09-01 ENCOUNTER — Telehealth: Payer: Self-pay | Admitting: Nurse Practitioner

## 2016-09-01 NOTE — Telephone Encounter (Signed)
Home health is going out tomorrow to check INR.  I have left message with Tye Maryland - home health nurse to see if she can pick up Prolia from our office and administer to patient.  Prolia was sent to our office from patient's mail order pharmacy.

## 2016-09-02 ENCOUNTER — Encounter: Payer: Self-pay | Admitting: Pharmacist

## 2016-09-02 DIAGNOSIS — I2510 Atherosclerotic heart disease of native coronary artery without angina pectoris: Secondary | ICD-10-CM | POA: Diagnosis not present

## 2016-09-02 DIAGNOSIS — D649 Anemia, unspecified: Secondary | ICD-10-CM | POA: Diagnosis not present

## 2016-09-02 DIAGNOSIS — J441 Chronic obstructive pulmonary disease with (acute) exacerbation: Secondary | ICD-10-CM | POA: Diagnosis not present

## 2016-09-02 DIAGNOSIS — I130 Hypertensive heart and chronic kidney disease with heart failure and stage 1 through stage 4 chronic kidney disease, or unspecified chronic kidney disease: Secondary | ICD-10-CM | POA: Diagnosis not present

## 2016-09-02 DIAGNOSIS — N182 Chronic kidney disease, stage 2 (mild): Secondary | ICD-10-CM | POA: Diagnosis not present

## 2016-09-02 DIAGNOSIS — E1122 Type 2 diabetes mellitus with diabetic chronic kidney disease: Secondary | ICD-10-CM | POA: Diagnosis not present

## 2016-09-02 DIAGNOSIS — J159 Unspecified bacterial pneumonia: Secondary | ICD-10-CM | POA: Diagnosis not present

## 2016-09-02 DIAGNOSIS — J440 Chronic obstructive pulmonary disease with acute lower respiratory infection: Secondary | ICD-10-CM | POA: Diagnosis not present

## 2016-09-02 DIAGNOSIS — I509 Heart failure, unspecified: Secondary | ICD-10-CM | POA: Diagnosis not present

## 2016-09-08 DIAGNOSIS — J188 Other pneumonia, unspecified organism: Secondary | ICD-10-CM | POA: Diagnosis not present

## 2016-09-08 DIAGNOSIS — J449 Chronic obstructive pulmonary disease, unspecified: Secondary | ICD-10-CM | POA: Diagnosis not present

## 2016-09-11 DIAGNOSIS — I2510 Atherosclerotic heart disease of native coronary artery without angina pectoris: Secondary | ICD-10-CM | POA: Diagnosis not present

## 2016-09-11 DIAGNOSIS — J441 Chronic obstructive pulmonary disease with (acute) exacerbation: Secondary | ICD-10-CM | POA: Diagnosis not present

## 2016-09-11 DIAGNOSIS — D649 Anemia, unspecified: Secondary | ICD-10-CM | POA: Diagnosis not present

## 2016-09-11 DIAGNOSIS — J440 Chronic obstructive pulmonary disease with acute lower respiratory infection: Secondary | ICD-10-CM | POA: Diagnosis not present

## 2016-09-11 DIAGNOSIS — E1122 Type 2 diabetes mellitus with diabetic chronic kidney disease: Secondary | ICD-10-CM | POA: Diagnosis not present

## 2016-09-11 DIAGNOSIS — N182 Chronic kidney disease, stage 2 (mild): Secondary | ICD-10-CM | POA: Diagnosis not present

## 2016-09-11 DIAGNOSIS — J159 Unspecified bacterial pneumonia: Secondary | ICD-10-CM | POA: Diagnosis not present

## 2016-09-11 DIAGNOSIS — I509 Heart failure, unspecified: Secondary | ICD-10-CM | POA: Diagnosis not present

## 2016-09-11 DIAGNOSIS — I130 Hypertensive heart and chronic kidney disease with heart failure and stage 1 through stage 4 chronic kidney disease, or unspecified chronic kidney disease: Secondary | ICD-10-CM | POA: Diagnosis not present

## 2016-09-11 LAB — POCT INR: INR: 2.3

## 2016-09-14 ENCOUNTER — Telehealth: Payer: Self-pay | Admitting: *Deleted

## 2016-09-14 ENCOUNTER — Ambulatory Visit (INDEPENDENT_AMBULATORY_CARE_PROVIDER_SITE_OTHER): Payer: Self-pay | Admitting: Pharmacist

## 2016-09-14 ENCOUNTER — Encounter: Payer: Self-pay | Admitting: Nurse Practitioner

## 2016-09-14 ENCOUNTER — Ambulatory Visit (INDEPENDENT_AMBULATORY_CARE_PROVIDER_SITE_OTHER): Payer: Commercial Managed Care - HMO | Admitting: Nurse Practitioner

## 2016-09-14 VITALS — BP 122/72 | HR 77 | Temp 96.8°F | Ht 62.0 in | Wt 128.0 lb

## 2016-09-14 DIAGNOSIS — N183 Chronic kidney disease, stage 3 unspecified: Secondary | ICD-10-CM

## 2016-09-14 DIAGNOSIS — K219 Gastro-esophageal reflux disease without esophagitis: Secondary | ICD-10-CM | POA: Diagnosis not present

## 2016-09-14 DIAGNOSIS — Z7901 Long term (current) use of anticoagulants: Secondary | ICD-10-CM

## 2016-09-14 DIAGNOSIS — F419 Anxiety disorder, unspecified: Secondary | ICD-10-CM

## 2016-09-14 DIAGNOSIS — J441 Chronic obstructive pulmonary disease with (acute) exacerbation: Secondary | ICD-10-CM | POA: Diagnosis not present

## 2016-09-14 DIAGNOSIS — I10 Essential (primary) hypertension: Secondary | ICD-10-CM | POA: Diagnosis not present

## 2016-09-14 DIAGNOSIS — I2510 Atherosclerotic heart disease of native coronary artery without angina pectoris: Secondary | ICD-10-CM

## 2016-09-14 DIAGNOSIS — R300 Dysuria: Secondary | ICD-10-CM | POA: Diagnosis not present

## 2016-09-14 DIAGNOSIS — E785 Hyperlipidemia, unspecified: Secondary | ICD-10-CM

## 2016-09-14 DIAGNOSIS — I4821 Permanent atrial fibrillation: Secondary | ICD-10-CM

## 2016-09-14 DIAGNOSIS — E1121 Type 2 diabetes mellitus with diabetic nephropathy: Secondary | ICD-10-CM | POA: Diagnosis not present

## 2016-09-14 DIAGNOSIS — D631 Anemia in chronic kidney disease: Secondary | ICD-10-CM

## 2016-09-14 DIAGNOSIS — I482 Chronic atrial fibrillation: Secondary | ICD-10-CM | POA: Diagnosis not present

## 2016-09-14 DIAGNOSIS — Z950 Presence of cardiac pacemaker: Secondary | ICD-10-CM

## 2016-09-14 DIAGNOSIS — N184 Chronic kidney disease, stage 4 (severe): Secondary | ICD-10-CM

## 2016-09-14 DIAGNOSIS — F17210 Nicotine dependence, cigarettes, uncomplicated: Secondary | ICD-10-CM

## 2016-09-14 DIAGNOSIS — E559 Vitamin D deficiency, unspecified: Secondary | ICD-10-CM

## 2016-09-14 DIAGNOSIS — E1310 Other specified diabetes mellitus with ketoacidosis without coma: Secondary | ICD-10-CM | POA: Diagnosis not present

## 2016-09-14 LAB — URINALYSIS, COMPLETE
Bilirubin, UA: NEGATIVE
GLUCOSE, UA: NEGATIVE
KETONES UA: NEGATIVE
NITRITE UA: NEGATIVE
PROTEIN UA: NEGATIVE
RBC, UA: NEGATIVE
UUROB: 0.2 mg/dL (ref 0.2–1.0)
pH, UA: 6.5 (ref 5.0–7.5)

## 2016-09-14 LAB — MICROSCOPIC EXAMINATION: RENAL EPITHEL UA: NONE SEEN /HPF

## 2016-09-14 LAB — BAYER DCA HB A1C WAIVED: HB A1C: 5.5 % (ref <7.0)

## 2016-09-14 NOTE — Progress Notes (Signed)
Subjective:    Patient ID: [name], female    DOB: 1935/06/11, 82 y.o.   MRN: [id number]  HPI   Patient here today for follow up of chronic medical problems. She has lots of medical problems and is on a lot of meds. She has been doing well the last couple of months.  Patient was I hospital a month ago with pneumonia. SHe says that she is doing much better. She has no complaints today.   Outpatient Encounter Prescriptions as of 09/14/2016  Medication Sig  . acetaminophen (TYLENOL) 500 MG tablet Take 1,000 mg by mouth every 6 (six) hours as needed for mild pain, moderate pain or fever.   [name] albuterol (PROVENTIL HFA;VENTOLIN HFA) 108 (90 Base) MCG/ACT inhaler Inhale 2 puffs into the lungs every 6 (six) hours as needed for wheezing or shortness of breath.  . Blood Glucose Calibration (TRUE METRIX LEVEL 1) LOW SOLN Use weekly  . Blood Glucose Monitoring Suppl (TRUE METRIX AIR GLUCOSE METER) DEVI 1 Device by Does not apply route 2 (two) times daily. Use to Test blood sugar bid. DX E13.10  . calcium-vitamin D 250-100 MG-UNIT tablet Take 2 tablets by mouth daily.  . clonazePAM (KLONOPIN) 0.5 MG tablet TAKE ONE (1) TABLET THREE (3) TIMES EACH DAY  . CREON 36000 units CPEP capsule TAKE 3 CAPSULES WITH MEALS  AND TAKE 1 CAPSULE WITH A SNACK AS DIRECTED  . denosumab (PROLIA) 60 MG/ML SOLN injection INJECT 60 MG SQ every 6 months.  . diltiazem (CARDIZEM) 120 MG tablet TAKE 1 TABLET EVERY DAY  . ferrous sulfate 325 (65 FE) MG tablet Take 1 tablet (325 mg total) by mouth daily with breakfast.  . furosemide (LASIX) 40 MG tablet Take 0.5-1 tablets (20-40 mg total) by mouth daily. (Patient taking differently: Take 20 mg by mouth daily. )  . guaiFENesin (MUCINEX) 600 MG 12 hr tablet Take 1 tablet (600 mg total) by mouth 2 (two) times daily.  [name] HYDROcodone-acetaminophen (NORCO) 7.5-325 MG tablet Take 1 tablet by mouth 3 (three) times daily as needed.  [name] ipratropium-albuterol (DUONEB) 0.5-2.5 (3) MG/3ML  SOLN INHALE THE CONTENTS OF 1 VIAL VIA NEBULIZER EVERY 4 HOURS AS NEEDED  . JANUVIA 50 MG tablet TAKE 1 TABLET EVERY DAY  . nitroGLYCERIN (NITROSTAT) 0.4 MG SL tablet Place 1 tablet (0.4 mg total) under the tongue every 5 (five) minutes as needed for chest pain.  . OXYGEN Inhale 2 L into the lungs at bedtime. 2lpm with sleep only   . pantoprazole (PROTONIX) 40 MG tablet TAKE 1 TABLET TWICE DAILY  . potassium chloride SA (K-DUR,KLOR-CON) 20 MEQ tablet TAKE ONE TABLET EVERY MORNING  . Probiotic Product ([place]) CAPS Take 1 capsule by mouth daily.  . simvastatin (ZOCOR) 10 MG tablet TAKE 1 TABLET EVERY DAY  . SYMBICORT 160-4.5 MCG/ACT inhaler INHALE 2 PUFFS TWICE DAILY  . TRUE METRIX BLOOD GLUCOSE TEST test strip TEST TWICE DAILY AS DIRECTED  . TRUEPLUS LANCETS 28G MISC TEST BLOOD SUGAR TWICE DAILY  . warfarin (COUMADIN) 2 MG tablet TAKE PER PHYSICIAN INSTRUCTIONS    No facility-administered encounter medications on file as of 09/14/2016.    Patient Active Problem List   Diagnosis Date Noted  . COPD exacerbation ([place]) 07/29/2016  . Bacterial pneumonia 07/29/2016  . Hypoxia 07/29/2016  . Microalbuminuria due to type 2 diabetes mellitus ([place]) 07/20/2016  . At high risk for falls 02/13/2016  . Chronic kidney disease (CKD), stage IV (severe) ([place]) 07/02/2015  . Nocturnal  hypoxemia 04/04/2015  . Hyperlipidemia with target LDL less than 100 12/31/2014  . Cigarette smoker 11/21/2014  . Osteoporosis, post-menopausal 11/01/2013  . GERD (gastroesophageal reflux disease) 07/13/2013  . Diarrhea 07/13/2013  . Aortic stenosis 06/27/2013  . Acute on chronic diastolic CHF (congestive heart failure) ([place]) 06/27/2013  . Vitamin D deficiency   . Tobacco abuse 05/12/2012  . Abdominal wall hernia 10/19/2011  . CAD (coronary artery disease)   . Pacemaker-St.Jude   . Chronic anticoagulation 12/03/2010  . Anxiety 12/03/2010  . Mitral stenosis 05/30/2010  . Anemia 05/16/2010  . Type 2  diabetes mellitus with kidney complication, without long-term current use of insulin ([place]) 01/19/2009  . Essential hypertension 01/19/2009  . Permanent atrial fibrillation ([place]) 01/19/2009  . COPD GOLD O  01/19/2009  . HEART MURMUR, BENIGN 01/19/2009       Review of Systems  Constitutional: Negative.   HENT: Negative.   Respiratory: Negative.   Cardiovascular: Negative.   Genitourinary: Negative.   Neurological: Negative.   Psychiatric/Behavioral: Negative.   All other systems reviewed and are negative.      Objective:   Physical Exam  Constitutional: She is oriented to person, place, and time. She appears well-developed and well-nourished.  HENT:  Right Ear: External ear normal.  Left Ear: External ear normal.  Nose: Nose normal.  Mouth/Throat: Oropharynx is clear and moist.  Eyes: Pupils are equal, round, and reactive to light.  Neck: Normal range of motion. Neck supple.  Cardiovascular: Normal rate and intact distal pulses.   Murmur (2/6 systoic) heard. Pulmonary/Chest: Effort normal and breath sounds normal. No respiratory distress. She has no wheezes. She has no rales.  Abdominal: Soft. Bowel sounds are normal.  Lymphadenopathy:    She has no cervical adenopathy.  Neurological: She is alert and oriented to person, place, and time.  Skin: Skin is warm and dry.  Circulatory skin changes to bil lower ext  Psychiatric: She has a normal mood and affect. Her behavior is normal. Judgment and thought content normal.   BP 122/72   Pulse 77   Temp (!) 96.8 F (36 C) (Oral)   Ht 5\' 2"  (1.575 m)   Wt 128 lb (58.1 kg)   BMI 23.41 kg/m     hgba1c 5.5%     Assessment & Plan:   1. Essential hypertension   2. Hyperlipidemia with target LDL less than 100   3. Dysuria   4. Permanent atrial fibrillation ([place])   5. Coronary artery disease involving native coronary artery of native heart without angina pectoris   6. COPD exacerbation ([place])   7. Gastroesophageal reflux  disease without esophagitis   8. Type 2 diabetes mellitus with diabetic nephropathy, without long-term current use of insulin ([place])   9. Chronic kidney disease (CKD), stage IV (severe) ([place])   10. Anxiety   11. Chronic anticoagulation   12. Cigarette smoker   13. Pacemaker-St.Jude   14. Vitamin D deficiency   15. Anemia in stage 3 chronic kidney disease    Fall precautions [place] pending Encouraged to get eye exam Continue all meds Follow up in 3 months  [place], FNP

## 2016-09-14 NOTE — Telephone Encounter (Signed)
This call was on voicemail from Friday 09/11/16. Protime 2705 INR 2.3

## 2016-09-14 NOTE — Telephone Encounter (Signed)
THerapeutic anticogulation Recommend continue warfarin 2mg  tablets - take 1/2 tablet daily. Recheck INR in 1 week.  Notified patient and home health nurse Ananias Pilgrim.

## 2016-09-14 NOTE — Patient Instructions (Signed)
Fall Prevention in the Home Introduction Falls can cause injuries. They can happen to people of all ages. There are many things you can do to make your home safe and to help prevent falls. What can I do on the outside of my home?  Regularly fix the edges of walkways and driveways and fix any cracks.  Remove anything that might make you trip as you walk through a door, such as a raised step or threshold.  Trim any bushes or trees on the path to your home.  Use bright outdoor lighting.  Clear any walking paths of anything that might make someone trip, such as rocks or tools.  Regularly check to see if handrails are loose or broken. Make sure that both sides of any steps have handrails.  Any raised decks and porches should have guardrails on the edges.  Have any leaves, snow, or ice cleared regularly.  Use sand or salt on walking paths during winter.  Clean up any spills in your garage right away. This includes oil or grease spills. What can I do in the bathroom?  Use night lights.  Install grab bars by the toilet and in the tub and shower. Do not use towel bars as grab bars.  Use non-skid mats or decals in the tub or shower.  If you need to sit down in the shower, use a plastic, non-slip stool.  Keep the floor dry. Clean up any water that spills on the floor as soon as it happens.  Remove soap buildup in the tub or shower regularly.  Attach bath mats securely with double-sided non-slip rug tape.  Do not have throw rugs and other things on the floor that can make you trip. What can I do in the bedroom?  Use night lights.  Make sure that you have a light by your bed that is easy to reach.  Do not use any sheets or blankets that are too big for your bed. They should not hang down onto the floor.  Have a firm chair that has side arms. You can use this for support while you get dressed.  Do not have throw rugs and other things on the floor that can make you trip. What can  I do in the kitchen?  Clean up any spills right away.  Avoid walking on wet floors.  Keep items that you use a lot in easy-to-reach places.  If you need to reach something above you, use a strong step stool that has a grab bar.  Keep electrical cords out of the way.  Do not use floor polish or wax that makes floors slippery. If you must use wax, use non-skid floor wax.  Do not have throw rugs and other things on the floor that can make you trip. What can I do with my stairs?  Do not leave any items on the stairs.  Make sure that there are handrails on both sides of the stairs and use them. Fix handrails that are broken or loose. Make sure that handrails are as long as the stairways.  Check any carpeting to make sure that it is firmly attached to the stairs. Fix any carpet that is loose or worn.  Avoid having throw rugs at the top or bottom of the stairs. If you do have throw rugs, attach them to the floor with carpet tape.  Make sure that you have a light switch at the top of the stairs and the bottom of the stairs. If you   do not have them, ask someone to add them for you. What else can I do to help prevent falls?  Wear shoes that:  Do not have high heels.  Have rubber bottoms.  Are comfortable and fit you well.  Are closed at the toe. Do not wear sandals.  If you use a stepladder:  Make sure that it is fully opened. Do not climb a closed stepladder.  Make sure that both sides of the stepladder are locked into place.  Ask someone to hold it for you, if possible.  Clearly mark and make sure that you can see:  Any grab bars or handrails.  First and last steps.  Where the edge of each step is.  Use tools that help you move around (mobility aids) if they are needed. These include:  Canes.  Walkers.  Scooters.  Crutches.  Turn on the lights when you go into a dark area. Replace any light bulbs as soon as they burn out.  Set up your furniture so you have a  clear path. Avoid moving your furniture around.  If any of your floors are uneven, fix them.  If there are any pets around you, be aware of where they are.  Review your medicines with your doctor. Some medicines can make you feel dizzy. This can increase your chance of falling. Ask your doctor what other things that you can do to help prevent falls. This information is not intended to replace advice given to you by your health care provider. Make sure you discuss any questions you have with your health care provider. Document Released: 05/23/2009 Document Revised: 01/02/2016 Document Reviewed: 08/31/2014  2017 Elsevier  

## 2016-09-15 LAB — CMP14+EGFR
ALBUMIN: 4 g/dL (ref 3.5–4.8)
ALT: 11 IU/L (ref 0–32)
AST: 22 IU/L (ref 0–40)
Albumin/Globulin Ratio: 1.3 (ref 1.2–2.2)
Alkaline Phosphatase: 101 IU/L (ref 39–117)
BUN / CREAT RATIO: 14 (ref 12–28)
BUN: 17 mg/dL (ref 8–27)
Bilirubin Total: 0.4 mg/dL (ref 0.0–1.2)
CO2: 23 mmol/L (ref 18–29)
CREATININE: 1.25 mg/dL — AB (ref 0.57–1.00)
Calcium: 8.6 mg/dL — ABNORMAL LOW (ref 8.7–10.3)
Chloride: 99 mmol/L (ref 96–106)
GFR, EST AFRICAN AMERICAN: 47 mL/min/{1.73_m2} — AB (ref >59)
GFR, EST NON AFRICAN AMERICAN: 41 mL/min/{1.73_m2} — AB (ref >59)
GLUCOSE: 129 mg/dL — AB (ref 65–99)
Globulin, Total: 3 g/dL (ref 1.5–4.5)
Potassium: 4.5 mmol/L (ref 3.5–5.2)
Sodium: 138 mmol/L (ref 134–144)
TOTAL PROTEIN: 7 g/dL (ref 6.0–8.5)

## 2016-09-15 LAB — LIPID PANEL
CHOL/HDL RATIO: 5.2 ratio — AB (ref 0.0–4.4)
CHOLESTEROL TOTAL: 140 mg/dL (ref 100–199)
HDL: 27 mg/dL — ABNORMAL LOW (ref >39)
LDL CALC: 86 mg/dL (ref 0–99)
Triglycerides: 135 mg/dL (ref 0–149)
VLDL CHOLESTEROL CAL: 27 mg/dL (ref 5–40)

## 2016-09-16 ENCOUNTER — Ambulatory Visit (INDEPENDENT_AMBULATORY_CARE_PROVIDER_SITE_OTHER): Payer: Self-pay | Admitting: Pharmacist

## 2016-09-16 ENCOUNTER — Telehealth: Payer: Self-pay

## 2016-09-16 DIAGNOSIS — J440 Chronic obstructive pulmonary disease with acute lower respiratory infection: Secondary | ICD-10-CM | POA: Diagnosis not present

## 2016-09-16 DIAGNOSIS — N182 Chronic kidney disease, stage 2 (mild): Secondary | ICD-10-CM | POA: Diagnosis not present

## 2016-09-16 DIAGNOSIS — I4821 Permanent atrial fibrillation: Secondary | ICD-10-CM

## 2016-09-16 DIAGNOSIS — I482 Chronic atrial fibrillation: Secondary | ICD-10-CM

## 2016-09-16 DIAGNOSIS — E1122 Type 2 diabetes mellitus with diabetic chronic kidney disease: Secondary | ICD-10-CM | POA: Diagnosis not present

## 2016-09-16 DIAGNOSIS — I2510 Atherosclerotic heart disease of native coronary artery without angina pectoris: Secondary | ICD-10-CM | POA: Diagnosis not present

## 2016-09-16 DIAGNOSIS — I509 Heart failure, unspecified: Secondary | ICD-10-CM | POA: Diagnosis not present

## 2016-09-16 DIAGNOSIS — D649 Anemia, unspecified: Secondary | ICD-10-CM | POA: Diagnosis not present

## 2016-09-16 DIAGNOSIS — J441 Chronic obstructive pulmonary disease with (acute) exacerbation: Secondary | ICD-10-CM | POA: Diagnosis not present

## 2016-09-16 DIAGNOSIS — J159 Unspecified bacterial pneumonia: Secondary | ICD-10-CM | POA: Diagnosis not present

## 2016-09-16 DIAGNOSIS — I130 Hypertensive heart and chronic kidney disease with heart failure and stage 1 through stage 4 chronic kidney disease, or unspecified chronic kidney disease: Secondary | ICD-10-CM | POA: Diagnosis not present

## 2016-09-16 LAB — POCT INR: INR: 2.3

## 2016-09-16 NOTE — Telephone Encounter (Signed)
[name] from [facility] called wanting to tell [name] patients INR - 2.3

## 2016-09-16 NOTE — Telephone Encounter (Signed)
Therapeutic anticoagulation.  Recommend continue curren warfarin dose of 1mg  qd Recheck INR in 2weeks.  Notified Tina with [facility] and patient of recommendations

## 2016-09-24 DIAGNOSIS — M2550 Pain in unspecified joint: Secondary | ICD-10-CM | POA: Diagnosis not present

## 2016-09-24 DIAGNOSIS — M5136 Other intervertebral disc degeneration, lumbar region: Secondary | ICD-10-CM | POA: Diagnosis not present

## 2016-09-24 DIAGNOSIS — Z79891 Long term (current) use of opiate analgesic: Secondary | ICD-10-CM | POA: Diagnosis not present

## 2016-09-24 DIAGNOSIS — F17200 Nicotine dependence, unspecified, uncomplicated: Secondary | ICD-10-CM | POA: Diagnosis not present

## 2016-09-24 DIAGNOSIS — M150 Primary generalized (osteo)arthritis: Secondary | ICD-10-CM | POA: Diagnosis not present

## 2016-09-24 DIAGNOSIS — G8929 Other chronic pain: Secondary | ICD-10-CM | POA: Diagnosis not present

## 2016-09-24 DIAGNOSIS — M25512 Pain in left shoulder: Secondary | ICD-10-CM | POA: Diagnosis not present

## 2016-09-25 DIAGNOSIS — I2510 Atherosclerotic heart disease of native coronary artery without angina pectoris: Secondary | ICD-10-CM | POA: Diagnosis not present

## 2016-09-25 DIAGNOSIS — J159 Unspecified bacterial pneumonia: Secondary | ICD-10-CM | POA: Diagnosis not present

## 2016-09-25 DIAGNOSIS — I509 Heart failure, unspecified: Secondary | ICD-10-CM | POA: Diagnosis not present

## 2016-09-25 DIAGNOSIS — J441 Chronic obstructive pulmonary disease with (acute) exacerbation: Secondary | ICD-10-CM | POA: Diagnosis not present

## 2016-09-25 DIAGNOSIS — E1122 Type 2 diabetes mellitus with diabetic chronic kidney disease: Secondary | ICD-10-CM | POA: Diagnosis not present

## 2016-09-25 DIAGNOSIS — I130 Hypertensive heart and chronic kidney disease with heart failure and stage 1 through stage 4 chronic kidney disease, or unspecified chronic kidney disease: Secondary | ICD-10-CM | POA: Diagnosis not present

## 2016-09-25 DIAGNOSIS — D649 Anemia, unspecified: Secondary | ICD-10-CM | POA: Diagnosis not present

## 2016-09-25 DIAGNOSIS — J440 Chronic obstructive pulmonary disease with acute lower respiratory infection: Secondary | ICD-10-CM | POA: Diagnosis not present

## 2016-09-25 DIAGNOSIS — N182 Chronic kidney disease, stage 2 (mild): Secondary | ICD-10-CM | POA: Diagnosis not present

## 2016-09-28 ENCOUNTER — Encounter: Payer: Self-pay | Admitting: *Deleted

## 2016-09-29 DIAGNOSIS — I509 Heart failure, unspecified: Secondary | ICD-10-CM | POA: Diagnosis not present

## 2016-09-29 DIAGNOSIS — I130 Hypertensive heart and chronic kidney disease with heart failure and stage 1 through stage 4 chronic kidney disease, or unspecified chronic kidney disease: Secondary | ICD-10-CM | POA: Diagnosis not present

## 2016-09-29 DIAGNOSIS — N182 Chronic kidney disease, stage 2 (mild): Secondary | ICD-10-CM | POA: Diagnosis not present

## 2016-09-29 DIAGNOSIS — E1122 Type 2 diabetes mellitus with diabetic chronic kidney disease: Secondary | ICD-10-CM | POA: Diagnosis not present

## 2016-09-29 DIAGNOSIS — I2510 Atherosclerotic heart disease of native coronary artery without angina pectoris: Secondary | ICD-10-CM | POA: Diagnosis not present

## 2016-09-29 DIAGNOSIS — J440 Chronic obstructive pulmonary disease with acute lower respiratory infection: Secondary | ICD-10-CM | POA: Diagnosis not present

## 2016-09-29 DIAGNOSIS — J441 Chronic obstructive pulmonary disease with (acute) exacerbation: Secondary | ICD-10-CM | POA: Diagnosis not present

## 2016-09-29 DIAGNOSIS — J159 Unspecified bacterial pneumonia: Secondary | ICD-10-CM | POA: Diagnosis not present

## 2016-09-29 DIAGNOSIS — D649 Anemia, unspecified: Secondary | ICD-10-CM | POA: Diagnosis not present

## 2016-09-30 ENCOUNTER — Telehealth: Payer: Self-pay | Admitting: *Deleted

## 2016-09-30 LAB — POCT INR: INR: 2.2

## 2016-09-30 NOTE — Telephone Encounter (Signed)
INR 2.2

## 2016-10-01 ENCOUNTER — Ambulatory Visit (INDEPENDENT_AMBULATORY_CARE_PROVIDER_SITE_OTHER): Payer: Self-pay | Admitting: Pharmacist

## 2016-10-01 DIAGNOSIS — I482 Chronic atrial fibrillation: Secondary | ICD-10-CM

## 2016-10-01 DIAGNOSIS — I4821 Permanent atrial fibrillation: Secondary | ICD-10-CM

## 2016-10-01 NOTE — Telephone Encounter (Signed)
No change in warfarin dose recommended.  Continue warfarin 2mg  - take 1/2 tablet daily.  Recheck INR in 1 week

## 2016-10-07 ENCOUNTER — Telehealth: Payer: Self-pay | Admitting: *Deleted

## 2016-10-07 ENCOUNTER — Ambulatory Visit (INDEPENDENT_AMBULATORY_CARE_PROVIDER_SITE_OTHER): Payer: Medicare HMO | Admitting: Pediatrics

## 2016-10-07 ENCOUNTER — Other Ambulatory Visit: Payer: Self-pay | Admitting: Nurse Practitioner

## 2016-10-07 ENCOUNTER — Ambulatory Visit (INDEPENDENT_AMBULATORY_CARE_PROVIDER_SITE_OTHER): Payer: Self-pay | Admitting: Pharmacist

## 2016-10-07 DIAGNOSIS — J441 Chronic obstructive pulmonary disease with (acute) exacerbation: Secondary | ICD-10-CM

## 2016-10-07 DIAGNOSIS — D649 Anemia, unspecified: Secondary | ICD-10-CM | POA: Diagnosis not present

## 2016-10-07 DIAGNOSIS — J188 Other pneumonia, unspecified organism: Secondary | ICD-10-CM | POA: Diagnosis not present

## 2016-10-07 DIAGNOSIS — I130 Hypertensive heart and chronic kidney disease with heart failure and stage 1 through stage 4 chronic kidney disease, or unspecified chronic kidney disease: Secondary | ICD-10-CM | POA: Diagnosis not present

## 2016-10-07 DIAGNOSIS — N182 Chronic kidney disease, stage 2 (mild): Secondary | ICD-10-CM | POA: Diagnosis not present

## 2016-10-07 DIAGNOSIS — I509 Heart failure, unspecified: Secondary | ICD-10-CM | POA: Diagnosis not present

## 2016-10-07 DIAGNOSIS — I482 Chronic atrial fibrillation: Secondary | ICD-10-CM

## 2016-10-07 DIAGNOSIS — J449 Chronic obstructive pulmonary disease, unspecified: Secondary | ICD-10-CM | POA: Diagnosis not present

## 2016-10-07 DIAGNOSIS — J159 Unspecified bacterial pneumonia: Secondary | ICD-10-CM

## 2016-10-07 DIAGNOSIS — J440 Chronic obstructive pulmonary disease with acute lower respiratory infection: Secondary | ICD-10-CM | POA: Diagnosis not present

## 2016-10-07 DIAGNOSIS — E1122 Type 2 diabetes mellitus with diabetic chronic kidney disease: Secondary | ICD-10-CM | POA: Diagnosis not present

## 2016-10-07 DIAGNOSIS — I4821 Permanent atrial fibrillation: Secondary | ICD-10-CM

## 2016-10-07 DIAGNOSIS — I2510 Atherosclerotic heart disease of native coronary artery without angina pectoris: Secondary | ICD-10-CM | POA: Diagnosis not present

## 2016-10-07 LAB — POCT INR: INR: 1.5

## 2016-10-07 NOTE — Telephone Encounter (Signed)
INR 1.5

## 2016-10-07 NOTE — Telephone Encounter (Signed)
Subtherapeutic anticoagulation Current warfarin dose is 2mg  tablets - take 1/2 or 1mg  daily. INR was 1.5 and patient reports she did not miss any dose and not med or dietary changes.  Recommend take 2mg  or 1 tablet for next 2 days, then resume take 1/2 tablet or 1mg  daily. RTC in 1 week to recheck INR since this is home health / [place] last home visit. Patient aware of above and Otila Kluver with [facility]>

## 2016-10-14 ENCOUNTER — Ambulatory Visit (INDEPENDENT_AMBULATORY_CARE_PROVIDER_SITE_OTHER): Payer: Medicare HMO | Admitting: Pharmacist

## 2016-10-14 DIAGNOSIS — I4821 Permanent atrial fibrillation: Secondary | ICD-10-CM

## 2016-10-14 DIAGNOSIS — I482 Chronic atrial fibrillation: Secondary | ICD-10-CM | POA: Diagnosis not present

## 2016-10-14 LAB — COAGUCHEK XS/INR WAIVED
INR: 2 — AB (ref 0.9–1.1)
PROTHROMBIN TIME: 24.1 s

## 2016-10-21 ENCOUNTER — Other Ambulatory Visit: Payer: Self-pay | Admitting: Nurse Practitioner

## 2016-10-22 NOTE — Telephone Encounter (Signed)
Refill called to [facility] 

## 2016-10-22 NOTE — Telephone Encounter (Signed)
Please call in clonazepam with 1 refills 

## 2016-11-02 ENCOUNTER — Encounter: Payer: Self-pay | Admitting: Pharmacist

## 2016-11-02 ENCOUNTER — Ambulatory Visit (INDEPENDENT_AMBULATORY_CARE_PROVIDER_SITE_OTHER): Payer: Medicare HMO | Admitting: Pharmacist

## 2016-11-02 ENCOUNTER — Other Ambulatory Visit: Payer: Self-pay | Admitting: Nurse Practitioner

## 2016-11-02 VITALS — BP 116/70 | HR 66 | Ht 62.0 in | Wt 138.0 lb

## 2016-11-02 DIAGNOSIS — Z0000 Encounter for general adult medical examination without abnormal findings: Secondary | ICD-10-CM

## 2016-11-02 DIAGNOSIS — I482 Chronic atrial fibrillation: Secondary | ICD-10-CM | POA: Diagnosis not present

## 2016-11-02 DIAGNOSIS — I4821 Permanent atrial fibrillation: Secondary | ICD-10-CM

## 2016-11-02 LAB — COAGUCHEK XS/INR WAIVED
INR: 1.7 — ABNORMAL HIGH (ref 0.9–1.1)
Prothrombin Time: 20.5 s

## 2016-11-02 NOTE — Patient Instructions (Addendum)
  [name] , Thank you for taking time to come for your Medicare Wellness Visit. I appreciate your ongoing commitment to your health goals. Please review the following plan we discussed and let me know if I can assist you in the future.   These are the goals we discussed:  Anticoagulation Dose Instructions as of 11/02/2016      Dorene Grebe Tue Wed Thu Fri Sat   New Dose 1 mg 2 mg 1 mg 1 mg 1 mg 1 mg 1 mg    Description   Increase warfarin dose starting today - increase to 1 tablet on Mondays.  Take 1/2 tablet (=1mg ) all other days.  INR was 1.7     Your yearly diabetic eye exam is past due - call my eye doctor for make appointment [phone]  Try to start doing chair exercises daily  Goal Blood glucose:    Fasting (before meals) = 80 to 130   Within 2 hours of eating = less than 180  Try to have no more than 3 of these foods per meal:  Fruit:   1/2 cup or once piece (baseball size)- apples, pears, pineapple, peaches, oranges  1 cup - berries, melons  1/2 banana or grapefruit  Stachy Vegetables:   1/2 cup potatoes (white or sweet), corn, peas  Other starches:   1 piece of bread  1/2 cup rice or pasta  4 inch pancake    These foods you can eat more freely: Proteins:   Fish  Chicken or [place]  Beef or pork (1 or 2 servings per week)  Eggs  Nuts (peanuts, walnuts, almonds, pistachios)  Cheese  Non starchy vegetables:  Green beans  Broccoli or cauliflower  Carrots  Onions and peppers  Celery  Tomatoes  Asparagus  Eggplant  Cucumbers  Squash and Zucchini     This is a list of the screening recommended for you and due dates:  Health Maintenance  Topic Date Due  . Eye exam for diabetics  03/23/2014  . DEXA scan (bone density measurement)  11/02/2015  . Hemoglobin A1C  03/14/2017  . Complete foot exam   09/14/2017  . Tetanus Vaccine  10/31/2025  . Flu Shot  Completed  . Pneumonia vaccines  Completed

## 2016-11-02 NOTE — Progress Notes (Signed)
Patient ID: [name], female   DOB: 1934-09-20, 82 y.o.   MRN: [id number]     Subjective:   [name] is a 82 y.o. female who presents for a Subsequent Medicare Annual Wellness Visit and check INR.   [name] has 2 children and 5 grandchildren.  Her granddaughter brings her to appointments.  She is very good family support.   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    Current Medications (verified) Outpatient Encounter Prescriptions as of 11/02/2016  Medication Sig  . acetaminophen (TYLENOL) 500 MG tablet Take 1,000 mg by mouth every 6 (six) hours as needed for mild pain, moderate pain or fever.   [name] albuterol (PROVENTIL HFA;VENTOLIN HFA) 108 (90 Base) MCG/ACT inhaler Inhale 2 puffs into the lungs every 6 (six) hours as needed for wheezing or shortness of breath.  . Blood Glucose Calibration (TRUE METRIX LEVEL 1) LOW SOLN Use weekly  . Blood Glucose Monitoring Suppl (TRUE METRIX AIR GLUCOSE METER) DEVI 1 Device by Does not apply route 2 (two) times daily. Use to Test blood sugar bid. DX E13.10  . calcium-vitamin D 250-100 MG-UNIT tablet Take 2 tablets by mouth daily.  . clonazePAM (KLONOPIN) 0.5 MG tablet TAKE ONE (1) TABLET THREE (3) TIMES EACH DAY  . CREON 36000 units CPEP capsule TAKE 3 CAPSULES WITH MEALS  AND TAKE 1 CAPSULE WITH A SNACK AS DIRECTED  . denosumab (PROLIA) 60 MG/ML SOLN injection INJECT 60 MG SQ every 6 months.  . diltiazem (CARDIZEM) 120 MG tablet TAKE 1 TABLET EVERY DAY  . ferrous sulfate 325 (65 FE) MG tablet Take 1 tablet (325 mg total) by mouth daily with breakfast.  . furosemide (LASIX) 40 MG tablet TAKE 1 TABLET EVERY DAY  . guaiFENesin (MUCINEX) 600 MG 12 hr tablet Take 1 tablet (600 mg total) by mouth 2 (two) times daily.  [name] HYDROcodone-acetaminophen (NORCO) 7.5-325 MG tablet Take 1 tablet by mouth 3 (three) times daily as needed.  [name] ipratropium-albuterol (DUONEB) 0.5-2.5 (3) MG/3ML SOLN INHALE  THE CONTENTS OF 1 VIAL VIA NEBULIZER EVERY 4 HOURS AS NEEDED  . JANUVIA 50 MG tablet TAKE 1 TABLET EVERY DAY  . nitroGLYCERIN (NITROSTAT) 0.4 MG SL tablet Place 1 tablet (0.4 mg total) under the tongue every 5 (five) minutes as needed for chest pain.  . OXYGEN Inhale 2 L into the lungs at bedtime. 2lpm with sleep only   . pantoprazole (PROTONIX) 40 MG tablet TAKE 1 TABLET TWICE DAILY  . potassium chloride SA (K-DUR,KLOR-CON) 20 MEQ tablet TAKE ONE TABLET EVERY MORNING  . Probiotic Product ([place]) CAPS Take 1 capsule by mouth daily.  . simvastatin (ZOCOR) 10 MG tablet TAKE 1 TABLET EVERY DAY  . SYMBICORT 160-4.5 MCG/ACT inhaler INHALE 2 PUFFS TWICE DAILY  . TRUE METRIX BLOOD GLUCOSE TEST test strip TEST TWICE DAILY AS DIRECTED  . TRUEPLUS LANCETS 28G MISC TEST BLOOD SUGAR TWICE DAILY  . warfarin (COUMADIN) 2 MG tablet TAKE PER PHYSICIAN INSTRUCTIONS    No facility-administered encounter medications on file as of 11/02/2016.     Allergies (verified) Macrobid [nitrofurantoin monohyd macro]; Torsemide; and Tramadol   History: Past Medical History:  Diagnosis Date  . Abnormal CT of the chest    Mediastinal and hilar adenopathy followed by Dr. Koleen Nimrod in [place]  . Anemia, unspecified   . Body mass index (BMI) of 20.0-20.9 in adult FEB 2013 147 LBS  . CAD (coronary artery disease) 2011  Catheterization August 2011: mild nonobstructive coronary artery disease complicated by large left rectus sheath hematoma status post evacuation Coumadin restarted without recurrence  . Carotid artery disease ([place])    Doppler, 05/2012, 0-39% bilateral  . Cataract   . Chronic airway obstruction, not elsewhere classified   . Chronic kidney disease, stage II (mild)   . Congestive heart failure, unspecified    EF now 60%, previous nonischemic cardiomyopathy  . Diabetes mellitus   . Dilated bile duct 10/19/2011  . Diverticulitis Dec 2012  . Gastritis    By EGD  . GERD (gastroesophageal  reflux disease)   . Hypercholesterolemia   . Hypertension   . Mitral regurgitation 06/27/2013  . Neurogenic sleep apnea    Uses noctural oxygen prescribed by her pulmonologi  . Pacemaker    Single chamber [place]ch[employer]. Jude ACCENT [phone] SN 719 04/11/1959 10/31/2010  . Permanent atrial fibrillation (HCC)    H/o tachybradycardia syndrome on Coumadin anticoagulation, has pacemaker.  . Recurrent UTI   . Rheumatic heart disease   . Tachycardia-bradycardia [facility])    s/p St. Jude single chamber PPM 10/2010   . Unspecified hypertensive kidney disease with chronic kidney disease stage I through stage IV, or unspecified(403.90)   . Valvular heart disease    a. H/o moderate mitral stenosis by cath 2011 but no mention of this on 10/2011 echo. b. 10/2011 echo: mod MR, mild AS, mild TR; EF>65%  . Vitamin D deficiency    Past Surgical History:  Procedure Laterality Date  . CHOLECYSTECTOMY     40+ years ago  . COLONOSCOPY  10/2011   Dyann [name] sigmoid to descending diverticulosis, internal hemorrhoids  . ESOPHAGOGASTRODUODENOSCOPY  10/2011   Fields-erosive gastritis, biopsy with no H. pylori, hiatal hernia, peptic duodenitis, no celiac disease, duodenal diverticulum, duodenal nodule  . EUS  11/16/11   Mishra-13 MM CBD, normal pancreatic duct, otherwise normal EUS  . Evacuation of retroperitoneal and left rectus sheath    . HEMATOMA EVACUATION     femoral  . HERNIA REPAIR     X3  . PILONIDAL CYST / SINUS EXCISION    . Single-chamber pacemaker implantation  3/12   by Dr Caryl Comes   Family History  Problem Relation Age of Onset  . Cancer Mother     cervical  . Stroke Mother   . Cancer Daughter 73    kidney, lung  . Heart disease Father   . Heart attack Father   . Ulcers Father   . Early death Sister 1    died at 29 months old  . Hypertension Brother   . Cancer Paternal Aunt     breast  . Hypertension Son   . Colon cancer Neg Hx    Social History   Occupational History  . RETIRED Retired    Social History Main Topics  . Smoking status: Current Every Day Smoker    Packs/day: 0.25    Years: 45.00    Types: Cigarettes    Start date: 02/05/1958  . Smokeless tobacco: Never Used     Comment: smokes 5-6 cigarettes daily  . Alcohol use No  . Drug use: No  . Sexual activity: Not Currently    Do you feel safe at home?  Yes Are there smokers in your home (other than you)? Yes  Dietary issues and exercise activities: Current Exercise Habits: The patient does not participate in regular exercise at present, Exercise limited by: orthopedic condition(s);respiratory conditions(s)  Current Dietary habits:  Eats vegetables regularly. She also  checked CHO and sugar amount on nutrition labels and tries to count CHOs.    Objective:    Today's Vitals   11/02/16 1523  BP: 116/70  Pulse: 66  Weight: 138 lb (62.6 kg)  Height: 5\' 2"  (1.575 m)  PainSc: 0-No pain   Body mass index is 25.24 kg/m.   INR was 1.7 today  Activities of Daily Living In your present state of health, do you have any difficulty performing the following activities: 11/02/2016 07/30/2016  Hearing? N N  Vision? N N  Difficulty concentrating or making decisions? N N  Walking or climbing stairs? Y Y  Dressing or bathing? Y Y  Doing errands, shopping? N Y  Conservation officer, nature and eating ? N -  Using the Toilet? N -  In the past six months, have you accidently leaked urine? N -  Do you have problems with loss of bowel control? N -  Managing your Medications? N -  Managing your Finances? N -  Housekeeping or managing your Housekeeping? N -  Some recent data might be hidden     Cardiac Risk Factors include: advanced age (>46men, >4 women);dyslipidemia;hypertension;sedentary lifestyle;smoking/ tobacco exposure  Depression Screen PHQ 2/9 Scores 11/02/2016 09/14/2016 08/14/2016 04/22/2016  PHQ - 2 Score 0 0 2 0  PHQ- 9 Score 5 - 8 -     Fall Risk Fall Risk  11/02/2016 09/14/2016 08/14/2016 04/22/2016 04/15/2016  Falls in  the past year? No No Yes Yes Yes  Number falls in past yr: - - 2 or more 2 or more 2 or more  Injury with Fall? - - Yes Yes Yes  Risk Factor Category  High Fall Risk - - - High Fall Risk  Risk for fall due to : - - - - History of fall(s)  Follow up Falls prevention discussed - - - -    Cognitive Function: MMSE - Mini Mental State Exam 11/02/2016 11/01/2015 11/01/2015 10/12/2014  Orientation to time 5 5 - 5  Orientation to Place 5 5 - 5  Registration 3 3 3 3   Attention/ Calculation 5 5 - 3  Recall 2 2 2 2   Language- name 2 objects 2 2 - 2  Language- repeat 1 1 - 1  Language- follow 3 step command 1 1 - 1  Language- read & follow direction 1 1 - 1  Write a sentence 1 1 - 1  Copy design 1 0 - 1  Total score 27 26 - 25    Immunizations and Health Maintenance Immunization History  Administered Date(s) Administered  . Influenza Split 05/12/2012  . Influenza, High Dose Seasonal PF 06/01/2016  . Influenza,inj,Quad PF,36+ Mos 05/11/2013, 06/21/2014, 05/23/2015  . Pneumococcal Conjugate-13 06/21/2014  . Pneumococcal Polysaccharide-23 06/30/1995, 10/04/2015  . Tdap 11/01/2015  . Zoster 10/12/2014   Health Maintenance Due  Topic Date Due  . OPHTHALMOLOGY EXAM  03/23/2014    Patient Care Team: [name], FNP as PCP - General (Nurse Practitioner) Daneil Dolin, MD (Gastroenterology) Thompson Grayer, MD as Consulting Physician (Cardiology) Steffanie Rainwater, DPM as Consulting Physician (Podiatry) Danie Binder, MD as Consulting Physician (Gastroenterology) Herminio Commons, MD as Attending Physician (Cardiology) Okey Regal, [place] as Consulting Physician (Optometry)  Indicate any recent Medical Services you may have received from other than [facility] providers in the past year (date may be approximate).    Assessment:    Annual Wellness Visit  Subtherapeutic antigoagulation   Screening Tests Health Maintenance  Topic Date Due  . OPHTHALMOLOGY  EXAM  03/23/2014  . HEMOGLOBIN  A1C  03/14/2017  . FOOT EXAM  09/14/2017  . DEXA SCAN  12/04/2017  . TETANUS/TDAP  10/31/2025  . INFLUENZA VACCINE  Completed  . PNA vac Low Risk Adult  Completed        Plan:   During the course of the visit Naarah was educated and counseled about the following appropriate screening and preventive services:   Vaccines to include Pneumoccal, Influenza, Td, Zostavax - all vaccines are UTD  Colorectal cancer screening - UTD  Cardiovascular disease screening - EKG and ECHO are both UTD  BP is currently well controlled  Lipids are WNL  Diabetes  - controlled  Bone Denisty / Osteoporosis Screening - UTD  Mammogram - no longer required. Patient continues to perform self breast exams  Glaucoma screening / Diabetic Eye Exam - needed - patient reminded to call for appt  Nutrition counseling - continue current CHO counting diet  Smoking cessation counseling - patient it cutting back but declined pharmacotherapy.   Advanced Directives - declined  Physical Activity - patient given handout for chair exercises.    Conitnue to use walker for ambulation  Anticoagulation Dose Instructions as of 11/02/2016      Dorene Grebe Tue Wed Thu Fri Sat   New Dose 1 mg 2 mg 1 mg 1 mg 1 mg 1 mg 1 mg    Description   Increase warfarin dose starting today - increase to 1 tablet on Mondays.  Take 1/2 tablet (=1mg ) all other days.  INR was 1.7     Patient Instructions (the written plan) were given to the patient.   Cherre Robins, PharmD   11/02/2016

## 2016-11-03 ENCOUNTER — Ambulatory Visit: Payer: Self-pay | Admitting: Pharmacist

## 2016-11-06 DIAGNOSIS — J449 Chronic obstructive pulmonary disease, unspecified: Secondary | ICD-10-CM | POA: Diagnosis not present

## 2016-11-06 DIAGNOSIS — J188 Other pneumonia, unspecified organism: Secondary | ICD-10-CM | POA: Diagnosis not present

## 2016-11-18 DIAGNOSIS — Z79891 Long term (current) use of opiate analgesic: Secondary | ICD-10-CM | POA: Diagnosis not present

## 2016-11-18 DIAGNOSIS — M25559 Pain in unspecified hip: Secondary | ICD-10-CM | POA: Diagnosis not present

## 2016-11-18 DIAGNOSIS — M150 Primary generalized (osteo)arthritis: Secondary | ICD-10-CM | POA: Diagnosis not present

## 2016-11-18 DIAGNOSIS — G894 Chronic pain syndrome: Secondary | ICD-10-CM | POA: Diagnosis not present

## 2016-11-18 DIAGNOSIS — M2550 Pain in unspecified joint: Secondary | ICD-10-CM | POA: Diagnosis not present

## 2016-11-18 DIAGNOSIS — F17200 Nicotine dependence, unspecified, uncomplicated: Secondary | ICD-10-CM | POA: Diagnosis not present

## 2016-11-18 DIAGNOSIS — M47817 Spondylosis without myelopathy or radiculopathy, lumbosacral region: Secondary | ICD-10-CM | POA: Diagnosis not present

## 2016-11-19 DIAGNOSIS — B351 Tinea unguium: Secondary | ICD-10-CM | POA: Diagnosis not present

## 2016-11-19 DIAGNOSIS — M79671 Pain in right foot: Secondary | ICD-10-CM | POA: Diagnosis not present

## 2016-11-19 DIAGNOSIS — L84 Corns and callosities: Secondary | ICD-10-CM | POA: Diagnosis not present

## 2016-11-19 DIAGNOSIS — E1151 Type 2 diabetes mellitus with diabetic peripheral angiopathy without gangrene: Secondary | ICD-10-CM | POA: Diagnosis not present

## 2016-11-25 ENCOUNTER — Telehealth: Payer: Self-pay | Admitting: Nurse Practitioner

## 2016-11-25 NOTE — Telephone Encounter (Signed)
What type of referral do you need? Pain management urinalysis  Have you been seen at our office for this problem? She needs approval from [facility] they are refusing to pay and she is getting billed for $263.00 she needs to have referral (If no, schedule them an appointment.  They will need to be seen before a referral can be done.)  Is there a particular doctor or location that you prefer? [facility] [place] She has appt on Dec 14, 2016  Patient notified that referrals can take up to a week or longer to process. If they haven't heard anything within a week they should call back and speak with the referral department.

## 2016-12-07 DIAGNOSIS — J449 Chronic obstructive pulmonary disease, unspecified: Secondary | ICD-10-CM | POA: Diagnosis not present

## 2016-12-07 DIAGNOSIS — J188 Other pneumonia, unspecified organism: Secondary | ICD-10-CM | POA: Diagnosis not present

## 2016-12-14 ENCOUNTER — Ambulatory Visit (INDEPENDENT_AMBULATORY_CARE_PROVIDER_SITE_OTHER): Payer: Medicare HMO | Admitting: Nurse Practitioner

## 2016-12-14 ENCOUNTER — Encounter: Payer: Self-pay | Admitting: Nurse Practitioner

## 2016-12-14 VITALS — BP 108/64 | HR 76 | Temp 97.6°F | Ht 62.0 in | Wt 135.0 lb

## 2016-12-14 DIAGNOSIS — Z950 Presence of cardiac pacemaker: Secondary | ICD-10-CM | POA: Diagnosis not present

## 2016-12-14 DIAGNOSIS — Z720 Tobacco use: Secondary | ICD-10-CM | POA: Diagnosis not present

## 2016-12-14 DIAGNOSIS — F419 Anxiety disorder, unspecified: Secondary | ICD-10-CM

## 2016-12-14 DIAGNOSIS — I10 Essential (primary) hypertension: Secondary | ICD-10-CM

## 2016-12-14 DIAGNOSIS — I482 Chronic atrial fibrillation: Secondary | ICD-10-CM | POA: Diagnosis not present

## 2016-12-14 DIAGNOSIS — I2510 Atherosclerotic heart disease of native coronary artery without angina pectoris: Secondary | ICD-10-CM

## 2016-12-14 DIAGNOSIS — I5033 Acute on chronic diastolic (congestive) heart failure: Secondary | ICD-10-CM | POA: Diagnosis not present

## 2016-12-14 DIAGNOSIS — E785 Hyperlipidemia, unspecified: Secondary | ICD-10-CM | POA: Diagnosis not present

## 2016-12-14 DIAGNOSIS — E876 Hypokalemia: Secondary | ICD-10-CM

## 2016-12-14 DIAGNOSIS — I4821 Permanent atrial fibrillation: Secondary | ICD-10-CM

## 2016-12-14 DIAGNOSIS — E1121 Type 2 diabetes mellitus with diabetic nephropathy: Secondary | ICD-10-CM | POA: Diagnosis not present

## 2016-12-14 DIAGNOSIS — N184 Chronic kidney disease, stage 4 (severe): Secondary | ICD-10-CM

## 2016-12-14 DIAGNOSIS — K219 Gastro-esophageal reflux disease without esophagitis: Secondary | ICD-10-CM

## 2016-12-14 DIAGNOSIS — Z9181 History of falling: Secondary | ICD-10-CM

## 2016-12-14 LAB — COAGUCHEK XS/INR WAIVED
INR: 1.5 — ABNORMAL HIGH (ref 0.9–1.1)
Prothrombin Time: 18.6 s

## 2016-12-14 LAB — BAYER DCA HB A1C WAIVED: HB A1C: 5.7 % (ref <7.0)

## 2016-12-14 MED ORDER — CLONAZEPAM 0.5 MG PO TABS: ORAL_TABLET | ORAL | 1 refills | Status: DC

## 2016-12-14 MED ORDER — WARFARIN SODIUM 2 MG PO TABS: ORAL_TABLET | ORAL | 1 refills | Status: DC

## 2016-12-14 MED ORDER — POTASSIUM CHLORIDE CRYS ER 20 MEQ PO TBCR: 20.0000 meq | EXTENDED_RELEASE_TABLET | Freq: Every morning | ORAL | 1 refills | Status: DC

## 2016-12-14 MED ORDER — BUDESONIDE-FORMOTEROL FUMARATE 160-4.5 MCG/ACT IN AERO: 2.0000 | INHALATION_SPRAY | Freq: Two times a day (BID) | RESPIRATORY_TRACT | 1 refills | Status: DC

## 2016-12-14 MED ORDER — SIMVASTATIN 10 MG PO TABS: 10.0000 mg | ORAL_TABLET | Freq: Every day | ORAL | 1 refills | Status: DC

## 2016-12-14 NOTE — Patient Instructions (Signed)
Anticoagulation Dose Instructions as of 12/14/2016      [name] Tue Wed Thu Fri Sat   New Dose 1 mg 2 mg 1 mg 2 mg 1 mg 2 mg 1 mg    Description   Increase warfarin dose starting today - increase to 1 tablet on Mondays, wednesday and friday.  Take 1/2 tablet (=1mg ) all other days.  INR was 1.5    Recheck in 1 week

## 2016-12-14 NOTE — Progress Notes (Addendum)
Subjective:    Patient ID: [name], female    DOB: 1935/07/11, 82 y.o.   MRN: [id number]  HPI  [name] is here today for follow up of chronic medical problem.  Outpatient Encounter Prescriptions as of 12/14/2016  Medication Sig  . acetaminophen (TYLENOL) 500 MG tablet Take 1,000 mg by mouth every 6 (six) hours as needed for mild pain, moderate pain or fever.   [name] albuterol (PROVENTIL HFA;VENTOLIN HFA) 108 (90 Base) MCG/ACT inhaler Inhale 2 puffs into the lungs every 6 (six) hours as needed for wheezing or shortness of breath.  . Blood Glucose Calibration (TRUE METRIX LEVEL 1) LOW SOLN Use weekly  . Blood Glucose Monitoring Suppl (TRUE METRIX AIR GLUCOSE METER) DEVI 1 Device by Does not apply route 2 (two) times daily. Use to Test blood sugar bid. DX E13.10  . calcium-vitamin D 250-100 MG-UNIT tablet Take 2 tablets by mouth daily.  . clonazePAM (KLONOPIN) 0.5 MG tablet TAKE ONE (1) TABLET THREE (3) TIMES EACH DAY  . CREON 36000 units CPEP capsule TAKE 3 CAPSULES WITH MEALS  AND TAKE 1 CAPSULE WITH A SNACK AS DIRECTED  . denosumab (PROLIA) 60 MG/ML SOLN injection INJECT 60 MG SQ every 6 months.  . diltiazem (CARDIZEM) 120 MG tablet TAKE 1 TABLET EVERY DAY  . ferrous sulfate 325 (65 FE) MG tablet Take 1 tablet (325 mg total) by mouth daily with breakfast.  . furosemide (LASIX) 40 MG tablet TAKE 1 TABLET EVERY DAY  . HYDROcodone-acetaminophen (NORCO) 7.5-325 MG tablet Take 1 tablet by mouth 3 (three) times daily as needed.  [name] ipratropium-albuterol (DUONEB) 0.5-2.5 (3) MG/3ML SOLN INHALE THE CONTENTS OF 1 VIAL VIA NEBULIZER EVERY 4 HOURS AS NEEDED  . JANUVIA 50 MG tablet TAKE 1 TABLET EVERY DAY  . OXYGEN Inhale 2 L into the lungs at bedtime. 2lpm with sleep only   . pantoprazole (PROTONIX) 40 MG tablet TAKE 1 TABLET TWICE DAILY  . potassium chloride SA (K-DUR,KLOR-CON) 20 MEQ tablet TAKE ONE TABLET EVERY MORNING  . Probiotic Product ([place]) CAPS Take 1 capsule  by mouth daily.  . simvastatin (ZOCOR) 10 MG tablet TAKE 1 TABLET EVERY DAY  . SYMBICORT 160-4.5 MCG/ACT inhaler INHALE 2 PUFFS TWICE DAILY  . TRUE METRIX BLOOD GLUCOSE TEST test strip TEST TWICE DAILY AS DIRECTED  . TRUEPLUS LANCETS 28G MISC TEST BLOOD SUGAR TWICE DAILY  . warfarin (COUMADIN) 2 MG tablet TAKE PER PHYSICIAN INSTRUCTIONS   . nitroGLYCERIN (NITROSTAT) 0.4 MG SL tablet Place 1 tablet (0.4 mg total) under the tongue every 5 (five) minutes as needed for chest pain. (Patient not taking: Reported on 12/14/2016)     1. Type 2 diabetes mellitus with diabetic nephropathy, without long-term current use of insulin (HCC)  Last hgba1c was 5.5%- patient does not check blood sugars everyday- no hypoglycemia that she is aware of  2. Essential hypertension  NO c/o chet pain <soB or HA_ does not check blood pressures at home  3. Hyperlipidemia with target LDL less than 100   does not watch diet at all  4. Permanent atrial fibrillation (HCC)   denies palpitations- chronis anti coag  5. Acute on chronic diastolic CHF (congestive heart failure) ([place])  Sees cardiology every 6 months  6. Coronary artery disease involving native coronary artery of native heart without angina pectoris  No c/o chest pain or SOB  7. Gastroesophageal reflux disease without esophagitis  No symptoms as  Long as sh takes her medicine  8.  Chronic kidney disease (CKD), stage IV (severe) (HCC)  Currently watching labs  9. Anxiety  Takes klonopin daily- gets very anxious if does not take  10. At high risk for falls  Will discuss fall precautions- patient uses walker  11. Pacemaker-St.Jude  Not aware of any problems  12. Tobacco abuse  Does not want to stop smoking    New complaints: None today     Review of Systems  Constitutional: Negative for diaphoresis.  HENT: Negative.   Eyes: Negative for pain.  Respiratory: Negative for shortness of breath.   Cardiovascular: Negative for chest pain, palpitations and  leg swelling.  Gastrointestinal: Negative for abdominal pain.  Endocrine: Negative for polydipsia.  Genitourinary: Negative.   Skin: Negative for rash.  Neurological: Negative for dizziness, weakness and headaches.  Hematological: Does not bruise/bleed easily.  Psychiatric/Behavioral: Negative.        Objective:   Physical Exam  Constitutional: She is oriented to person, place, and time. She appears well-developed and well-nourished.  HENT:  Nose: Nose normal.  Mouth/Throat: Oropharynx is clear and moist.  Eyes: EOM are normal.  Neck: Trachea normal, normal range of motion and full passive range of motion without pain. Neck supple. No JVD present. Carotid bruit is not present. No thyromegaly present.  Cardiovascular: Normal rate, regular rhythm and intact distal pulses.  Exam reveals no gallop and no friction rub.   Murmur (1/6 systolic murmur) heard. Pulmonary/Chest: Effort normal and breath sounds normal.  Abdominal: Soft. Bowel sounds are normal. She exhibits no distension and no mass. There is no tenderness.  Musculoskeletal: Normal range of motion. She exhibits no edema.  Walking with walker  Lymphadenopathy:    She has no cervical adenopathy.  Neurological: She is alert and oriented to person, place, and time. She has normal reflexes.  Skin: Skin is warm and dry.  Psychiatric: She has a normal mood and affect. Her behavior is normal. Judgment and thought content normal.   BP 108/64   Pulse 76   Temp 97.6 F (36.4 C) (Oral)   Ht _0  (1.575 m)   Wt 135 lb (61.2 kg)   BMI 24.69 kg/m        Assessment & Plan:  1. Type 2 diabetes mellitus with diabetic nephropathy, without long-term current use of insulin (HCC) Continue to watch carbs in diet - Bayer DCA Hb A1c Waived  2. Essential hypertension Low sodium diet - CMP14+EGFR  3. Hyperlipidemia with target LDL less than 100 Watch fat in diet - Lipid panel - simvastatin (ZOCOR) 10 MG tablet; Take 1 tablet (10 mg  total) by mouth daily.  Dispense: 90 tablet; Refill: 1  4. Permanent atrial fibrillation ([place]) Keep follow up with cardiology - CoaguChek XS/INR Waived  5. Acute on chronic diastolic CHF (congestive heart failure) (HCC) limit fluid intake  6. Coronary artery disease involving native coronary artery of native heart without angina pectoris Keep follow up with cardiology  7. Gastroesophageal reflux disease without esophagitis Avoid spicy foods Do not eat 2 hours prior to bedtime  8. Chronic kidney disease (CKD), stage IV (severe) (HCC) Will continue to watch labs  9. Anxiety Stress management - clonazePAM (KLONOPIN) 0.5 MG tablet; TAKE ONE (1) TABLET THREE (3) TIMES EACH DAY  Dispense: 90 tablet; Refill: 1  10. At high risk for falls Continue to walk with wlkr  11. Pacemaker-St.Jude - warfarin (COUMADIN) 2 MG tablet; TAKE PER PHYSICIAN INSTRUCTIONS  Dispense: 135 tablet; Refill: 1  12. Tobacco abuse encouraged to  stop smoking - budesonide-formoterol (SYMBICORT) 160-4.5 MCG/ACT inhaler; Inhale 2 puffs into the lungs 2 (two) times daily.  Dispense: 3 Inhaler; Refill: 1  13. Hypokalemia - potassium chloride SA (K-DUR,KLOR-CON) 20 MEQ tablet; Take 1 tablet (20 mEq total) by mouth every morning.  Dispense: 90 tablet; Refill: 1   Encouraged to get eye exam Labs pending Health maintenance reviewed Diet and exercise encouraged Continue all meds Follow up  In 3 months INR check in 1 week   [place], FNP

## 2016-12-15 LAB — CMP14+EGFR
ALT: 11 IU/L (ref 0–32)
AST: 18 IU/L (ref 0–40)
Albumin/Globulin Ratio: 1.1 — ABNORMAL LOW (ref 1.2–2.2)
Albumin: 3.9 g/dL (ref 3.5–4.8)
Alkaline Phosphatase: 68 IU/L (ref 39–117)
BUN/Creatinine Ratio: 15 (ref 12–28)
BUN: 25 mg/dL (ref 8–27)
Bilirubin Total: 0.4 mg/dL (ref 0.0–1.2)
CALCIUM: 9.6 mg/dL (ref 8.7–10.3)
CO2: 23 mmol/L (ref 18–29)
CREATININE: 1.71 mg/dL — AB (ref 0.57–1.00)
Chloride: 99 mmol/L (ref 96–106)
GFR calc Af Amer: 32 mL/min/{1.73_m2} — ABNORMAL LOW (ref >59)
GFR, EST NON AFRICAN AMERICAN: 28 mL/min/{1.73_m2} — AB (ref >59)
GLUCOSE: 113 mg/dL — AB (ref 65–99)
Globulin, Total: 3.7 g/dL (ref 1.5–4.5)
POTASSIUM: 4.7 mmol/L (ref 3.5–5.2)
Sodium: 138 mmol/L (ref 134–144)
Total Protein: 7.6 g/dL (ref 6.0–8.5)

## 2016-12-15 LAB — LIPID PANEL
CHOL/HDL RATIO: 4.3 ratio (ref 0.0–4.4)
Cholesterol, Total: 138 mg/dL (ref 100–199)
HDL: 32 mg/dL — AB (ref >39)
LDL CALC: 83 mg/dL (ref 0–99)
TRIGLYCERIDES: 115 mg/dL (ref 0–149)
VLDL Cholesterol Cal: 23 mg/dL (ref 5–40)

## 2016-12-21 ENCOUNTER — Encounter: Payer: Medicare HMO | Admitting: Pharmacist

## 2016-12-22 ENCOUNTER — Other Ambulatory Visit: Payer: Self-pay | Admitting: Nurse Practitioner

## 2016-12-22 DIAGNOSIS — Z720 Tobacco use: Secondary | ICD-10-CM

## 2016-12-22 DIAGNOSIS — E785 Hyperlipidemia, unspecified: Secondary | ICD-10-CM

## 2016-12-24 ENCOUNTER — Encounter: Payer: Medicare HMO | Admitting: Pharmacist

## 2016-12-28 ENCOUNTER — Ambulatory Visit (INDEPENDENT_AMBULATORY_CARE_PROVIDER_SITE_OTHER): Payer: Medicare HMO | Admitting: Pharmacist

## 2016-12-28 DIAGNOSIS — I4821 Permanent atrial fibrillation: Secondary | ICD-10-CM

## 2016-12-28 DIAGNOSIS — I482 Chronic atrial fibrillation: Secondary | ICD-10-CM

## 2016-12-28 LAB — COAGUCHEK XS/INR WAIVED
INR: 2.3 — ABNORMAL HIGH (ref 0.9–1.1)
Prothrombin Time: 27.2 s

## 2017-01-06 DIAGNOSIS — J449 Chronic obstructive pulmonary disease, unspecified: Secondary | ICD-10-CM | POA: Diagnosis not present

## 2017-01-06 DIAGNOSIS — J188 Other pneumonia, unspecified organism: Secondary | ICD-10-CM | POA: Diagnosis not present

## 2017-01-12 ENCOUNTER — Other Ambulatory Visit: Payer: Self-pay | Admitting: Nurse Practitioner

## 2017-01-12 DIAGNOSIS — Z950 Presence of cardiac pacemaker: Secondary | ICD-10-CM

## 2017-01-14 DIAGNOSIS — M47817 Spondylosis without myelopathy or radiculopathy, lumbosacral region: Secondary | ICD-10-CM | POA: Diagnosis not present

## 2017-01-14 DIAGNOSIS — Z79899 Other long term (current) drug therapy: Secondary | ICD-10-CM | POA: Diagnosis not present

## 2017-01-18 ENCOUNTER — Other Ambulatory Visit: Payer: Self-pay | Admitting: Nurse Practitioner

## 2017-01-20 ENCOUNTER — Encounter: Payer: Self-pay | Admitting: Pharmacist

## 2017-01-21 ENCOUNTER — Encounter: Payer: Self-pay | Admitting: Pharmacist

## 2017-01-28 ENCOUNTER — Ambulatory Visit (INDEPENDENT_AMBULATORY_CARE_PROVIDER_SITE_OTHER): Payer: Medicare HMO | Admitting: Pharmacist

## 2017-01-28 DIAGNOSIS — I482 Chronic atrial fibrillation: Secondary | ICD-10-CM | POA: Diagnosis not present

## 2017-01-28 DIAGNOSIS — I4821 Permanent atrial fibrillation: Secondary | ICD-10-CM

## 2017-01-28 LAB — COAGUCHEK XS/INR WAIVED
INR: 3.4 — AB (ref 0.9–1.1)
PROTHROMBIN TIME: 40.8 s

## 2017-02-06 DIAGNOSIS — J188 Other pneumonia, unspecified organism: Secondary | ICD-10-CM | POA: Diagnosis not present

## 2017-02-06 DIAGNOSIS — J449 Chronic obstructive pulmonary disease, unspecified: Secondary | ICD-10-CM | POA: Diagnosis not present

## 2017-02-17 ENCOUNTER — Other Ambulatory Visit: Payer: Self-pay | Admitting: Nurse Practitioner

## 2017-02-17 DIAGNOSIS — F419 Anxiety disorder, unspecified: Secondary | ICD-10-CM

## 2017-02-18 ENCOUNTER — Ambulatory Visit (INDEPENDENT_AMBULATORY_CARE_PROVIDER_SITE_OTHER): Payer: Medicare HMO | Admitting: Pharmacist

## 2017-02-18 DIAGNOSIS — I482 Chronic atrial fibrillation: Secondary | ICD-10-CM | POA: Diagnosis not present

## 2017-02-18 DIAGNOSIS — I4821 Permanent atrial fibrillation: Secondary | ICD-10-CM

## 2017-02-18 LAB — COAGUCHEK XS/INR WAIVED
INR: 3.7 — AB (ref 0.9–1.1)
PROTHROMBIN TIME: 44.2 s

## 2017-02-18 MED ORDER — DENOSUMAB 60 MG/ML ~~LOC~~ SOLN: SUBCUTANEOUS | 0 refills | Status: DC

## 2017-02-18 NOTE — Patient Instructions (Signed)
Anticoagulation Warfarin Dose Instructions as of 02/18/2017      [name] Tue Wed Thu Fri Sat   New Dose 1 mg 2 mg 1 mg 1 mg 1 mg 1 mg 1 mg    Description   No warfarin today - Thursday, July 12th.  Then decrease warfarin dose to 1 tablet on Mondays. Take 1/2 tablet all other days  INR was 3.7 (goal is 2.0 to 3.0)

## 2017-02-19 NOTE — Telephone Encounter (Signed)
Last seen 12/14/16 MMM   If approved route to nurse to call into [facility]

## 2017-02-19 NOTE — Telephone Encounter (Signed)
Please call in clonazepam with 1 refills 

## 2017-02-19 NOTE — Telephone Encounter (Signed)
Refill called to [facility] 

## 2017-02-24 ENCOUNTER — Telehealth: Payer: Self-pay | Admitting: *Deleted

## 2017-02-24 NOTE — Telephone Encounter (Signed)
TC from [place] wanting to set of delivery for patients prolia and to verify medication. Please call [phone] to set up delivery. Please have  patient's [employer] id number and providers NPI number.

## 2017-02-24 NOTE — Telephone Encounter (Signed)
Prolia shipping 03/02/17 for patient's appt 03/09/2017.  Verified NPI and patient ID

## 2017-02-25 DIAGNOSIS — L84 Corns and callosities: Secondary | ICD-10-CM | POA: Diagnosis not present

## 2017-02-25 DIAGNOSIS — E1151 Type 2 diabetes mellitus with diabetic peripheral angiopathy without gangrene: Secondary | ICD-10-CM | POA: Diagnosis not present

## 2017-02-25 DIAGNOSIS — B351 Tinea unguium: Secondary | ICD-10-CM | POA: Diagnosis not present

## 2017-02-25 DIAGNOSIS — M79673 Pain in unspecified foot: Secondary | ICD-10-CM | POA: Diagnosis not present

## 2017-03-02 ENCOUNTER — Other Ambulatory Visit: Payer: Self-pay | Admitting: Nurse Practitioner

## 2017-03-08 DIAGNOSIS — J188 Other pneumonia, unspecified organism: Secondary | ICD-10-CM | POA: Diagnosis not present

## 2017-03-08 DIAGNOSIS — J449 Chronic obstructive pulmonary disease, unspecified: Secondary | ICD-10-CM | POA: Diagnosis not present

## 2017-03-09 ENCOUNTER — Ambulatory Visit (INDEPENDENT_AMBULATORY_CARE_PROVIDER_SITE_OTHER): Payer: Medicare HMO | Admitting: Pharmacist

## 2017-03-09 DIAGNOSIS — M810 Age-related osteoporosis without current pathological fracture: Secondary | ICD-10-CM

## 2017-03-09 DIAGNOSIS — I4821 Permanent atrial fibrillation: Secondary | ICD-10-CM

## 2017-03-09 DIAGNOSIS — I482 Chronic atrial fibrillation: Secondary | ICD-10-CM | POA: Diagnosis not present

## 2017-03-09 LAB — COAGUCHEK XS/INR WAIVED
INR: 2.3 — ABNORMAL HIGH (ref 0.9–1.1)
Prothrombin Time: 27.7 s

## 2017-03-09 MED ORDER — DENOSUMAB 60 MG/ML ~~LOC~~ SOLN
60.0000 mg | Freq: Once | SUBCUTANEOUS | Status: AC
  Administered 2017-03-09: 60 mg via SUBCUTANEOUS

## 2017-03-09 NOTE — Patient Instructions (Signed)
Anticoagulation Warfarin Dose Instructions as of 03/09/2017      Dorene Grebe Tue Wed Thu Fri Sat   New Dose 1 mg 1 mg 1 mg 1 mg 1 mg 2 mg 1 mg    Description   Continue current warfarin 2mg  tablets - take 1 tablet on Fridays. Take 1/2 tablet all other days  INR was 2.3 (goal is 2.0 to 3.0)      Continue calcium supplement - take 1 tablet daily

## 2017-03-09 NOTE — Progress Notes (Signed)
Patient ID: Annisha Baar, female   DOB: [birth year], 82 y.o.   MRN: [id number]   Subjective:    Mrs. Brabant is here today to recheck INR due to chronic anticoagulation therapy secondary to  atrial fibrillation.  Patient is also here to receive Prolia injection for treatment of osteoporosis.  Bleeding signs/symptoms: None Thromboembolic signs/symptoms: None  Missed Coumadin doses: denies Medication changes: none Dietary changes: none Bacterial/viral infection: no  Mrs. Tilmon's Prolia shipped to our office from her mail order pharmacy and is supplied by patient Last DEXA was 12/05/2015 and showed improvement in BMD compared to DEXA from  Lowest T-Score = -3.4 at L2 of spine and also L1-2 was -2.7 (11/01/2013)  Objective:    INR Today: 2.3 Current dose: warfarin 2mg  - 1 tablet on Fridays and 1/2 tablet all other days.   Assessment:    Therapeutic INR for goal of 2-3  Osteoporosis, post menopausal   Plan:    1. New dose: no change in warfarin   2. Next INR: 1 month   3.  Prolia 60mg  administered SQ in left arm to day in office. Next dose 08/2017 4.  Continue calcium supplementation 1200mg  daily from diet and supplementation   Cherre Robins, PharmD, CPP

## 2017-03-18 ENCOUNTER — Other Ambulatory Visit: Payer: Self-pay | Admitting: Nurse Practitioner

## 2017-03-18 DIAGNOSIS — Z950 Presence of cardiac pacemaker: Secondary | ICD-10-CM

## 2017-03-23 ENCOUNTER — Ambulatory Visit (INDEPENDENT_AMBULATORY_CARE_PROVIDER_SITE_OTHER): Payer: Medicare HMO | Admitting: Nurse Practitioner

## 2017-03-23 ENCOUNTER — Encounter: Payer: Self-pay | Admitting: Nurse Practitioner

## 2017-03-23 VITALS — BP 82/46 | HR 60 | Temp 97.4°F | Ht 62.0 in | Wt 138.0 lb

## 2017-03-23 DIAGNOSIS — I2510 Atherosclerotic heart disease of native coronary artery without angina pectoris: Secondary | ICD-10-CM

## 2017-03-23 DIAGNOSIS — K219 Gastro-esophageal reflux disease without esophagitis: Secondary | ICD-10-CM

## 2017-03-23 DIAGNOSIS — Z9181 History of falling: Secondary | ICD-10-CM

## 2017-03-23 DIAGNOSIS — Z7901 Long term (current) use of anticoagulants: Secondary | ICD-10-CM

## 2017-03-23 DIAGNOSIS — I482 Chronic atrial fibrillation: Secondary | ICD-10-CM | POA: Diagnosis not present

## 2017-03-23 DIAGNOSIS — Z950 Presence of cardiac pacemaker: Secondary | ICD-10-CM | POA: Diagnosis not present

## 2017-03-23 DIAGNOSIS — J449 Chronic obstructive pulmonary disease, unspecified: Secondary | ICD-10-CM | POA: Diagnosis not present

## 2017-03-23 DIAGNOSIS — N183 Chronic kidney disease, stage 3 unspecified: Secondary | ICD-10-CM

## 2017-03-23 DIAGNOSIS — E1121 Type 2 diabetes mellitus with diabetic nephropathy: Secondary | ICD-10-CM

## 2017-03-23 DIAGNOSIS — F419 Anxiety disorder, unspecified: Secondary | ICD-10-CM

## 2017-03-23 DIAGNOSIS — D631 Anemia in chronic kidney disease: Secondary | ICD-10-CM

## 2017-03-23 DIAGNOSIS — I10 Essential (primary) hypertension: Secondary | ICD-10-CM | POA: Diagnosis not present

## 2017-03-23 DIAGNOSIS — E785 Hyperlipidemia, unspecified: Secondary | ICD-10-CM

## 2017-03-23 DIAGNOSIS — Z720 Tobacco use: Secondary | ICD-10-CM

## 2017-03-23 DIAGNOSIS — M810 Age-related osteoporosis without current pathological fracture: Secondary | ICD-10-CM | POA: Diagnosis not present

## 2017-03-23 DIAGNOSIS — I4821 Permanent atrial fibrillation: Secondary | ICD-10-CM

## 2017-03-23 LAB — COAGUCHEK XS/INR WAIVED
INR: 2.1 — AB (ref 0.9–1.1)
Prothrombin Time: 25.7 s

## 2017-03-23 LAB — BAYER DCA HB A1C WAIVED: HB A1C (BAYER DCA - WAIVED): 5.5 % (ref <7.0)

## 2017-03-23 MED ORDER — CLONAZEPAM 0.5 MG PO TABS: ORAL_TABLET | ORAL | 2 refills | Status: DC

## 2017-03-23 MED ORDER — WARFARIN SODIUM 2 MG PO TABS: ORAL_TABLET | ORAL | 1 refills | Status: DC

## 2017-03-23 NOTE — Addendum Note (Signed)
Addended by: [name] on: 03/23/2017 12:23 PM   Modules accepted: Orders

## 2017-03-23 NOTE — Patient Instructions (Signed)

## 2017-03-23 NOTE — Progress Notes (Signed)
Subjective:    Patient ID: [name], female    DOB: Apr 07, 1935, 82 y.o.   MRN: [id number]  HPI Patient comes in today for follow up of chronic problems. She has been doing quite well since last visit. SHe has  Not needed to be hospitalized in quite some time. She c/o of constantt pain from arthritis and back pain. She currently goes to pain management to get her medications. SHe is also on klonopin TID and pain management is aware of this. SHe is also on coumadin and INR checked today. She denies any bleeding. She says she is doing okay today without complaunts  SOCIAL HISTORY: Her granddaughter no longer  lives with her- due to drug problems  Patient Active Problem List   Diagnosis Date Noted  . At high risk for falls 02/13/2016  . Chronic kidney disease (CKD), stage IV (severe) ([place]) 07/02/2015  . Hyperlipidemia with target LDL less than 100 12/31/2014  . Osteoporosis, post-menopausal 11/01/2013  . GERD (gastroesophageal reflux disease) 07/13/2013  . Diarrhea 07/13/2013  . Aortic stenosis 06/27/2013  . Acute on chronic diastolic CHF (congestive heart failure) ([place]) 06/27/2013  . Vitamin D deficiency   . Tobacco abuse 05/12/2012  . Abdominal wall hernia 10/19/2011  . CAD (coronary artery disease)   . Pacemaker-St.Jude   . Chronic anticoagulation 12/03/2010  . Anxiety 12/03/2010  . Mitral stenosis 05/30/2010  . Anemia 05/16/2010  . Type 2 diabetes mellitus with kidney complication, without long-term current use of insulin ([place]) 01/19/2009  . Essential hypertension 01/19/2009  . Permanent atrial fibrillation ([place]) 01/19/2009  . COPD GOLD O  01/19/2009  . HEART MURMUR, BENIGN 01/19/2009   Outpatient Encounter Prescriptions as of 03/23/2017  Medication Sig  . acetaminophen (TYLENOL) 500 MG tablet Take 1,000 mg by mouth every 6 (six) hours as needed for mild pain, moderate pain or fever.   . Blood Glucose Calibration (TRUE METRIX LEVEL 1) LOW SOLN Use weekly  . Blood  Glucose Monitoring Suppl (TRUE METRIX AIR GLUCOSE METER) DEVI 1 Device by Does not apply route 2 (two) times daily. Use to Test blood sugar bid. DX E13.10  . calcium-vitamin D 250-100 MG-UNIT tablet Take 2 tablets by mouth daily.  . clonazePAM (KLONOPIN) 0.5 MG tablet TAKE ONE (1) TABLET THREE (3) TIMES EACH DAY  . CREON 36000 units CPEP capsule TAKE 3 CAPSULES WITH MEALS  AND TAKE 1 CAPSULE WITH A SNACK AS DIRECTED  . denosumab (PROLIA) 60 MG/ML SOLN injection INJECT 60 MG SQ every 6 months.  . diltiazem (CARDIZEM) 120 MG tablet TAKE 1 TABLET EVERY DAY  . ferrous sulfate 325 (65 FE) MG tablet Take 1 tablet (325 mg total) by mouth daily with breakfast.  . furosemide (LASIX) 40 MG tablet TAKE 1 TABLET EVERY DAY  . HYDROcodone-acetaminophen (NORCO) 7.5-325 MG tablet Take 1 tablet by mouth 3 (three) times daily as needed.  [name] ipratropium-albuterol (DUONEB) 0.5-2.5 (3) MG/3ML SOLN INHALE THE CONTENTS OF 1 VIAL VIA NEBULIZER EVERY 4 HOURS AS NEEDED  . JANUVIA 50 MG tablet TAKE 1 TABLET EVERY DAY  . nitroGLYCERIN (NITROSTAT) 0.4 MG SL tablet Place 1 tablet (0.4 mg total) under the tongue every 5 (five) minutes as needed for chest pain.  . OXYGEN Inhale 2 L into the lungs at bedtime. 2lpm with sleep only   . pantoprazole (PROTONIX) 40 MG tablet TAKE 1 TABLET TWICE DAILY  . potassium chloride SA (K-DUR,KLOR-CON) 20 MEQ tablet Take 1 tablet (20 mEq total) by mouth every morning.  [name]  Probiotic Product ([place]) CAPS Take 1 capsule by mouth daily.  . simvastatin (ZOCOR) 10 MG tablet TAKE 1 TABLET EVERY DAY  . SYMBICORT 160-4.5 MCG/ACT inhaler INHALE 2 PUFFS TWICE DAILY  . TRUE METRIX BLOOD GLUCOSE TEST test strip TEST TWICE DAILY AS DIRECTED  . TRUEPLUS LANCETS 28G MISC TEST BLOOD SUGAR TWICE DAILY  . VENTOLIN HFA 108 (90 Base) MCG/ACT inhaler USE 2 PUFFS EVERY 6 HOURS AS NEEDED  . warfarin (COUMADIN) 2 MG tablet TAKE PER PHYSICIAN INSTRUCTIONS      Review of Systems  Constitutional:  Negative for activity change, appetite change and fatigue.  HENT: Negative.   Eyes: Negative for pain.  Respiratory: Positive for shortness of breath (occasional).   Cardiovascular: Positive for leg swelling. Negative for chest pain and palpitations.  Gastrointestinal: Negative for abdominal pain.  Endocrine: Negative for polydipsia.  Genitourinary: Negative.   Skin: Negative for rash.  Neurological: Negative for dizziness, weakness and headaches.  Hematological: Does not bruise/bleed easily.  Psychiatric/Behavioral: Negative.   All other systems reviewed and are negative.      Objective:   Physical Exam  Constitutional: She is oriented to person, place, and time. She appears well-developed and well-nourished.  HENT:  Nose: Nose normal.  Mouth/Throat: Oropharynx is clear and moist.  Eyes: EOM are normal.  Neck: Trachea normal, normal range of motion and full passive range of motion without pain. Neck supple. No JVD present. Carotid bruit is not present. No thyromegaly present.  Cardiovascular: Normal rate, regular rhythm, normal heart sounds and intact distal pulses.  Exam reveals no gallop and no friction rub.   No murmur heard. Pulmonary/Chest: Effort normal. She has wheezes (exp wheezes thrughout).  Abdominal: Soft. Bowel sounds are normal. She exhibits no distension and no mass. There is no tenderness.  abdominsl incisional hernia- nonincarcerated  Musculoskeletal: Normal range of motion.  Gait steady- walking with walker Left knee effusion with crepitus on flexion and extension- all ligaments intact  Lymphadenopathy:    She has no cervical adenopathy.  Neurological: She is alert and oriented to person, place, and time. She has normal reflexes.  Skin: Skin is warm and dry.  Psychiatric: She has a normal mood and affect. Her behavior is normal. Judgment and thought content normal.   BP (!) 82/46   Pulse 60   Temp (!) 97.4 F (36.3 C) (Oral)   Ht 5' 2"  (1.575 m)   Wt 138  lb (62.6 kg)   BMI 25.24 kg/m   hgba1c 5.5%    Assessment & Plan:   1. Hyperlipidemia with target LDL less than 100   2. Type 2 diabetes mellitus with diabetic nephropathy, without long-term current use of insulin ([place])   3. Essential hypertension   4. Permanent atrial fibrillation ([place])   5. Anxiety   6. Pacemaker-St.Jude   7. Coronary artery disease involving native coronary artery of native heart without angina pectoris   8. COPD GOLD O    9. Gastroesophageal reflux disease without esophagitis   10. Osteoporosis, post-menopausal   11. Tobacco abuse   12. Chronic anticoagulation   13. At high risk for falls   14. Anemia in stage 3 chronic kidney disease    Continue all meds See anticoag documentation Labs pending  Fall precautions discussed Discussed getting medical alert system since she lives by herself Follow up in 3 months  Meds ordered this encounter  Medications  . clonazePAM (KLONOPIN) 0.5 MG tablet    Sig: TAKE ONE (1) TABLET  THREE (3) TIMES EACH DAY    Dispense:  90 tablet    Refill:  2    Order Specific Question:   Supervising Provider    Answer:   VINCENT, CAROL L [[id number]]  . warfarin (COUMADIN) 2 MG tablet    Sig: TAKE PER PHYSICIAN INSTRUCTIONS    Dispense:  135 tablet    Refill:  1    Order Specific Question:   Supervising Provider    Answer:   VINCENT, CAROL L [[id number]]   Orders Placed This Encounter  Procedures  . Bayer DCA Hb A1c Waived  . CMP14+EGFR  . Lipid panel  . Microalbumin / creatinine urine ratio   [name], FNP

## 2017-03-24 LAB — LIPID PANEL
CHOL/HDL RATIO: 3.8 ratio (ref 0.0–4.4)
CHOLESTEROL TOTAL: 106 mg/dL (ref 100–199)
HDL: 28 mg/dL — ABNORMAL LOW (ref >39)
LDL CALC: 59 mg/dL (ref 0–99)
TRIGLYCERIDES: 93 mg/dL (ref 0–149)
VLDL CHOLESTEROL CAL: 19 mg/dL (ref 5–40)

## 2017-03-24 LAB — CMP14+EGFR
ALBUMIN: 3.9 g/dL (ref 3.5–4.7)
ALK PHOS: 85 IU/L (ref 39–117)
ALT: 7 IU/L (ref 0–32)
AST: 21 IU/L (ref 0–40)
Albumin/Globulin Ratio: 1.3 (ref 1.2–2.2)
BUN / CREAT RATIO: 12 (ref 12–28)
BUN: 21 mg/dL (ref 8–27)
Bilirubin Total: 0.4 mg/dL (ref 0.0–1.2)
CALCIUM: 8.8 mg/dL (ref 8.7–10.3)
CO2: 22 mmol/L (ref 20–29)
CREATININE: 1.78 mg/dL — AB (ref 0.57–1.00)
Chloride: 103 mmol/L (ref 96–106)
GFR calc Af Amer: 31 mL/min/{1.73_m2} — ABNORMAL LOW (ref >59)
GFR, EST NON AFRICAN AMERICAN: 27 mL/min/{1.73_m2} — AB (ref >59)
GLOBULIN, TOTAL: 2.9 g/dL (ref 1.5–4.5)
Glucose: 125 mg/dL — ABNORMAL HIGH (ref 65–99)
Potassium: 4.1 mmol/L (ref 3.5–5.2)
SODIUM: 139 mmol/L (ref 134–144)
Total Protein: 6.8 g/dL (ref 6.0–8.5)

## 2017-03-26 ENCOUNTER — Other Ambulatory Visit: Payer: Self-pay | Admitting: Nurse Practitioner

## 2017-03-29 ENCOUNTER — Other Ambulatory Visit: Payer: Self-pay | Admitting: Cardiovascular Disease

## 2017-03-29 ENCOUNTER — Other Ambulatory Visit: Payer: Self-pay | Admitting: Nurse Practitioner

## 2017-04-08 DIAGNOSIS — J449 Chronic obstructive pulmonary disease, unspecified: Secondary | ICD-10-CM | POA: Diagnosis not present

## 2017-04-08 DIAGNOSIS — J188 Other pneumonia, unspecified organism: Secondary | ICD-10-CM | POA: Diagnosis not present

## 2017-04-15 DIAGNOSIS — M75102 Unspecified rotator cuff tear or rupture of left shoulder, not specified as traumatic: Secondary | ICD-10-CM | POA: Diagnosis not present

## 2017-04-15 DIAGNOSIS — G8929 Other chronic pain: Secondary | ICD-10-CM | POA: Diagnosis not present

## 2017-04-15 DIAGNOSIS — Z79899 Other long term (current) drug therapy: Secondary | ICD-10-CM | POA: Diagnosis not present

## 2017-04-16 ENCOUNTER — Encounter: Payer: Commercial Managed Care - HMO | Admitting: Internal Medicine

## 2017-04-17 ENCOUNTER — Other Ambulatory Visit: Payer: Self-pay | Admitting: Nurse Practitioner

## 2017-04-17 DIAGNOSIS — F419 Anxiety disorder, unspecified: Secondary | ICD-10-CM

## 2017-04-19 ENCOUNTER — Ambulatory Visit: Payer: Medicare HMO | Admitting: Nurse Practitioner

## 2017-04-20 ENCOUNTER — Encounter: Payer: Self-pay | Admitting: Nurse Practitioner

## 2017-04-20 ENCOUNTER — Ambulatory Visit (INDEPENDENT_AMBULATORY_CARE_PROVIDER_SITE_OTHER): Payer: Medicare HMO | Admitting: Nurse Practitioner

## 2017-04-20 VITALS — BP 119/67 | HR 75 | Temp 97.4°F | Ht 62.0 in | Wt 137.0 lb

## 2017-04-20 DIAGNOSIS — J441 Chronic obstructive pulmonary disease with (acute) exacerbation: Secondary | ICD-10-CM | POA: Diagnosis not present

## 2017-04-20 DIAGNOSIS — R531 Weakness: Secondary | ICD-10-CM | POA: Diagnosis not present

## 2017-04-20 LAB — CMP14+EGFR
ALT: 21 IU/L (ref 0–32)
AST: 35 IU/L (ref 0–40)
Albumin/Globulin Ratio: 1.3 (ref 1.2–2.2)
Albumin: 4.1 g/dL (ref 3.5–4.7)
Alkaline Phosphatase: 81 IU/L (ref 39–117)
BUN/Creatinine Ratio: 13 (ref 12–28)
BUN: 23 mg/dL (ref 8–27)
Bilirubin Total: 0.5 mg/dL (ref 0.0–1.2)
CALCIUM: 8.7 mg/dL (ref 8.7–10.3)
CHLORIDE: 101 mmol/L (ref 96–106)
CO2: 23 mmol/L (ref 20–29)
Creatinine, Ser: 1.75 mg/dL — ABNORMAL HIGH (ref 0.57–1.00)
GFR, EST AFRICAN AMERICAN: 31 mL/min/{1.73_m2} — AB (ref >59)
GFR, EST NON AFRICAN AMERICAN: 27 mL/min/{1.73_m2} — AB (ref >59)
Globulin, Total: 3.2 g/dL (ref 1.5–4.5)
Glucose: 103 mg/dL — ABNORMAL HIGH (ref 65–99)
Potassium: 4.1 mmol/L (ref 3.5–5.2)
Sodium: 141 mmol/L (ref 134–144)
TOTAL PROTEIN: 7.3 g/dL (ref 6.0–8.5)

## 2017-04-20 MED ORDER — DOXYCYCLINE HYCLATE 100 MG PO TABS: 100.0000 mg | ORAL_TABLET | Freq: Two times a day (BID) | ORAL | 0 refills | Status: DC

## 2017-04-20 NOTE — Progress Notes (Signed)
   Subjective:    Patient ID: [name], female    DOB: [birth year], 82 y.o.   MRN: [id number]  HPI Patient in the office with complaints of coughing that started 3-4 days ago.  She states she is increasingly weak for the last 2 weeks.  Patient states cough is non-productive.  Patient has a nebulizer and states this is not helping with her cough. She smokes 4-5 cigarettes a day. Patient states previously when she was this weak, she had to go to the hospital and her sodium was found to be low.     Review of Systems  Constitutional: Positive for activity change and fatigue (x2 weeks).  HENT: Negative for postnasal drip and rhinorrhea.   Respiratory: Cough: x3-4 days; non-productive.   Neurological: Positive for headaches (when sleeping too long).  All other systems reviewed and are negative.      Objective:   Physical Exam  Constitutional: She is oriented to person, place, and time. She appears well-developed and well-nourished. No distress.  HENT:  Mouth/Throat: Oropharynx is clear and moist.  Neck: Normal range of motion. Neck supple.  Cardiovascular: Normal rate, regular rhythm and normal heart sounds.   Pulmonary/Chest: Effort normal. No respiratory distress. She has wheezes (expiratory wheezing in all lung fields).  Lymphadenopathy:    She has no cervical adenopathy.  Neurological: She is alert and oriented to person, place, and time.  Skin: Skin is warm and dry.  Psychiatric: She has a normal mood and affect. Her behavior is normal.   BP 119/67   Pulse 75   Temp (!) 97.4 F (36.3 C) (Oral)   Ht '5\' 2"'$  (1.575 m)   Wt 137 lb (62.1 kg)   BMI 25.06 kg/m      Assessment & Plan:  1. COPD with acute exacerbation ([place]) Smoking cessation encouraged OTC cough meds as needed - doxycycline (VIBRA-TABS) 100 MG tablet; Take 1 tablet (100 mg total) by mouth 2 (two) times daily. 1 po bid  Dispense: 20 tablet; Refill: 0  2. Weakness labs pending - CMP14+EGFR  Mary-Margaret  Hassell Done, FNP

## 2017-04-20 NOTE — Patient Instructions (Signed)
Chronic Obstructive Pulmonary Disease Exacerbation  Chronic obstructive pulmonary disease (COPD) is a common lung problem. In COPD, the flow of air from the lungs is limited. COPD exacerbations are times that breathing gets worse and you need extra treatment. Without treatment they can be life threatening. If they happen often, your lungs can become more damaged. If your COPD gets worse, your doctor may treat you with:  ? Medicines.  ? Oxygen.  ? Different ways to clear your airway, such as using a mask.    Follow these instructions at home:  ? Do not smoke.  ? Avoid tobacco smoke and other things that bother your lungs.  ? If given, take your antibiotic medicine as told. Finish the medicine even if you start to feel better.  ? Only take medicines as told by your doctor.  ? Drink enough fluids to keep your pee (urine) clear or pale yellow (unless your doctor has told you not to).  ? Use a cool mist machine (vaporizer).  ? If you use oxygen or a machine that turns liquid medicine into a mist (nebulizer), continue to use them as told.  ? Keep up with shots (vaccinations) as told by your doctor.  ? Exercise regularly.  ? Eat healthy foods.  ? Keep all doctor visits as told.  Get help right away if:  ? You are very short of breath and it gets worse.  ? You have trouble talking.  ? You have bad chest pain.  ? You have blood in your spit (sputum).  ? You have a fever.  ? You keep throwing up (vomiting).  ? You feel weak, or you pass out (faint).  ? You feel confused.  ? You keep getting worse.  This information is not intended to replace advice given to you by your health care provider. Make sure you discuss any questions you have with your health care provider.  Document Released: 07/16/2011 Document Revised: 01/02/2016 Document Reviewed: 03/31/2013  Elsevier Interactive Patient Education ? 2017 Elsevier Inc.

## 2017-04-23 ENCOUNTER — Ambulatory Visit (INDEPENDENT_AMBULATORY_CARE_PROVIDER_SITE_OTHER): Payer: Medicare HMO | Admitting: Pharmacist Clinician (PhC)/ Clinical Pharmacy Specialist

## 2017-04-23 DIAGNOSIS — I4821 Permanent atrial fibrillation: Secondary | ICD-10-CM

## 2017-04-23 DIAGNOSIS — I4891 Unspecified atrial fibrillation: Secondary | ICD-10-CM

## 2017-04-23 DIAGNOSIS — I482 Chronic atrial fibrillation: Secondary | ICD-10-CM | POA: Diagnosis not present

## 2017-04-23 LAB — COAGUCHEK XS/INR WAIVED
INR: 2 — ABNORMAL HIGH (ref 0.9–1.1)
PROTHROMBIN TIME: 24.6 s

## 2017-04-23 NOTE — Patient Instructions (Signed)
Anticoagulation Warfarin Dose Instructions as of 04/23/2017      Dorene Grebe Tue Wed Thu Fri Sat   New Dose 1 mg 1 mg 1 mg 1 mg 1 mg 2 mg 1 mg    Description   Continue current warfarin 2mg  tablets - take 1 tablet on Fridays. Take 1/2 tablet all other days  INR is 2.0 (goal is 2.0 to 3.0)

## 2017-04-26 ENCOUNTER — Ambulatory Visit: Payer: Medicare HMO | Admitting: Nurse Practitioner

## 2017-05-05 ENCOUNTER — Other Ambulatory Visit: Payer: Self-pay | Admitting: Nurse Practitioner

## 2017-05-05 ENCOUNTER — Ambulatory Visit (INDEPENDENT_AMBULATORY_CARE_PROVIDER_SITE_OTHER): Payer: Medicare HMO | Admitting: Internal Medicine

## 2017-05-05 ENCOUNTER — Encounter: Payer: Self-pay | Admitting: Internal Medicine

## 2017-05-05 VITALS — BP 76/56 | HR 60 | Ht 62.0 in | Wt 137.4 lb

## 2017-05-05 DIAGNOSIS — I482 Chronic atrial fibrillation: Secondary | ICD-10-CM

## 2017-05-05 DIAGNOSIS — Z720 Tobacco use: Secondary | ICD-10-CM

## 2017-05-05 DIAGNOSIS — I495 Sick sinus syndrome: Secondary | ICD-10-CM

## 2017-05-05 DIAGNOSIS — I4821 Permanent atrial fibrillation: Secondary | ICD-10-CM

## 2017-05-05 DIAGNOSIS — R5383 Other fatigue: Secondary | ICD-10-CM | POA: Diagnosis not present

## 2017-05-05 DIAGNOSIS — Z950 Presence of cardiac pacemaker: Secondary | ICD-10-CM | POA: Diagnosis not present

## 2017-05-05 NOTE — Progress Notes (Signed)
PCP: [name], FNP Primary Cardiologist:  Dr Bronson Ing Primary EP:  Dr Rayann Heman  [name] is a 82 y.o. female who presents today for routine electrophysiology followup.  Since last being seen in our clinic, the patient reports doing reasonably well.  She is fatigued and sleepy.  Today, she denies symptoms of palpitations, chest pain, shortness of breath,  lower extremity edema, dizziness, presyncope, or syncope.  The patient is otherwise without complaint today.   Past Medical History:  Diagnosis Date  . Abnormal CT of the chest    Mediastinal and hilar adenopathy followed by Dr. Koleen Nimrod in [place]  . Anemia, unspecified   . Body mass index (BMI) of 20.0-20.9 in adult FEB 2013 147 LBS  . CAD (coronary artery disease) 2011   Catheterization August 2011: mild nonobstructive coronary artery disease complicated by large left rectus sheath hematoma status post evacuation Coumadin restarted without recurrence  . Carotid artery disease ([place])    Doppler, 05/2012, 0-39% bilateral  . Cataract   . Chronic airway obstruction, not elsewhere classified   . Chronic kidney disease, stage II (mild)   . Congestive heart failure, unspecified    EF now 60%, previous nonischemic cardiomyopathy  . Diabetes mellitus   . Dilated bile duct 10/19/2011  . Diverticulitis Dec 2012  . Gastritis    By EGD  . GERD (gastroesophageal reflux disease)   . Hypercholesterolemia   . Hypertension   . Mitral regurgitation 06/27/2013  . Neurogenic sleep apnea    Uses noctural oxygen prescribed by her pulmonologi  . Pacemaker    Single chamber [place]. Jude ACCENT [phone] SN 719 04/[birth year]/1959 10/31/2010  . Permanent atrial fibrillation (H[facility])    H/o tachybradycardia syndrome on Coumadin anticoagulation, has pacemaker.  . Recurrent UTI   . Rheumatic heart disease   . Tachycardia-bradycardia [facility])    s/p St. Jude single chamber PPM 10/2010   . Unspecified hypertensive kidney disease with chronic kidney  disease stage I through stage IV, or unspecified(403.90)   . Valvular heart disease    a. H/o moderate mitral stenosis by cath 2011 but no mention of this on 10/2011 echo. b. 10/2011 echo: mod MR, mild AS, mild TR; EF>65%  . Vitamin D deficiency    Past Surgical History:  Procedure Laterality Date  . CHOLECYSTECTOMY     40+ years ago  . COLONOSCOPY  10/2011   Dyann Ruddle sigmoid to descending diverticulosis, internal hemorrhoids  . ESOPHAGOGASTRODUODENOSCOPY  10/2011   Fields-erosive gastritis, biopsy with no H. pylori, hiatal hernia, peptic duodenitis, no celiac disease, duodenal diverticulum, duodenal nodule  . EUS  11/16/11   Mishra-13 MM CBD, normal pancreatic duct, otherwise normal EUS  . Evacuation of retroperitoneal and left rectus sheath    . HEMATOMA EVACUATION     femoral  . HERNIA REPAIR     X3  . PILONIDAL CYST / SINUS EXCISION    . Single-chamber pacemaker implantation  3/12   by Dr Caryl Comes    ROS- all systems are reviewed and negative except as per HPI above  Current Outpatient Prescriptions  Medication Sig Dispense Refill  . acetaminophen (TYLENOL) 500 MG tablet Take 1,000 mg by mouth every 6 (six) hours as needed for mild pain, moderate pain or fever.     . Blood Glucose Calibration (TRUE METRIX LEVEL 1) LOW SOLN Use weekly 3 each 1  . Blood Glucose Monitoring Suppl (TRUE METRIX AIR GLUCOSE METER) DEVI 1 Device by Does not apply route 2 (two) times daily. Use  to Test blood sugar bid. DX E13.10 1 Device 0  . calcium-vitamin D 250-100 MG-UNIT tablet Take 2 tablets by mouth daily.    . clonazePAM (KLONOPIN) 0.5 MG tablet TAKE ONE (1) TABLET THREE (3) TIMES EACH DAY 90 tablet 2  . CREON 36000 units CPEP capsule TAKE 3 CAPSULES WITH MEALS  AND TAKE 1 CAPSULE WITH A SNACK AS DIRECTED 900 capsule 2  . denosumab (PROLIA) 60 MG/ML SOLN injection INJECT 60 MG SQ every 6 months. 1 mL 0  . diltiazem (CARDIZEM) 120 MG tablet TAKE 1 TABLET EVERY DAY 90 tablet 0  . ferrous sulfate 325  (65 FE) MG tablet Take 1 tablet (325 mg total) by mouth daily with breakfast.  3  . furosemide (LASIX) 40 MG tablet TAKE 1 TABLET EVERY DAY 90 tablet 0  . HYDROcodone-acetaminophen (NORCO) 7.5-325 MG tablet Take 1 tablet by mouth 3 (three) times daily as needed (pain).     [name] ipratropium-albuterol (DUONEB) 0.5-2.5 (3) MG/3ML SOLN INHALE THE CONTENTS OF 1 VIAL VIA NEBULIZER EVERY 4 HOURS AS NEEDED 1080 mL 1  . JANUVIA 50 MG tablet TAKE 1 TABLET EVERY DAY 90 tablet 1  . nitroGLYCERIN (NITROSTAT) 0.4 MG SL tablet Place 1 tablet (0.4 mg total) under the tongue every 5 (five) minutes as needed for chest pain. 25 tablet 3  . OXYGEN Inhale 2 L into the lungs at bedtime. 2lpm with sleep only     . pantoprazole (PROTONIX) 40 MG tablet TAKE 1 TABLET TWICE DAILY 180 tablet 1  . potassium chloride SA (K-DUR,KLOR-CON) 20 MEQ tablet Take 1 tablet (20 mEq total) by mouth every morning. 90 tablet 1  . Probiotic Product ([place]) CAPS Take 1 capsule by mouth daily.    . simvastatin (ZOCOR) 10 MG tablet TAKE 1 TABLET EVERY DAY 90 tablet 1  . SYMBICORT 160-4.5 MCG/ACT inhaler INHALE 2 PUFFS TWICE DAILY 3 Inhaler 1  . TRUE METRIX BLOOD GLUCOSE TEST test strip TEST TWICE DAILY AS DIRECTED 100 each 1  . TRUEPLUS LANCETS 28G MISC TEST BLOOD SUGAR TWICE DAILY 200 each 1  . VENTOLIN HFA 108 (90 Base) MCG/ACT inhaler USE 2 PUFFS EVERY 6 HOURS AS NEEDED 18 g 4  . warfarin (COUMADIN) 2 MG tablet TAKE PER PHYSICIAN INSTRUCTIONS 135 tablet 1   No current facility-administered medications for this visit.     Physical Exam: Vitals:   05/05/17 1436  BP: (!) 76/56  Pulse: 60  SpO2: 95%  Weight: 137 lb 6.4 oz (62.3 kg)  Height: 5\' 2"  (1.575 m)    GEN- The patient is elderly and frail appearing, alert and oriented x 3 today.   Head- normocephalic, atraumatic Eyes-  Sclera clear, conjunctiva pink Ears- hearing intact Oropharynx- clear with dry MM Lungs- Clear to ausculation bilaterally, normal work of  breathing Chest- pacemaker pocket is well healed Heart- irregular rate and rhythm GI- soft, NT, ND, + BS Extremities- no clubbing, cyanosis, or edema Skin- diffuse ecchymosis on legs  Pacemaker interrogation- reviewed in detail today,  See PACEART report   Assessment and Plan:  1. Symptomatic bradycardia  Normal pacemaker function See Pace Art report No changes today  2. Permanent afib On coumadin chads2vasc score if 4 Mostly rate controlled  3. Tobacco Cessation advised She says she is trying to quit  4. Chronic venous insufficiency Stable No change required today  5. CAD No ischemic symptoms No changes  6. Hypotension and fatigue Recent labs reviewed Will check cbc today given h/o  anemia and hypotension today  7. CRI Reduce lasix to 20mg  daily x 5 days then increase back to 40mg  daily give low BP today She will follow-up with Dr Bronson Ing as scheduled in October.  He can decide whether or not to further reduce lasix then based on her response.  Merlin Return in a year to see me in [place] MD, [facility] 05/05/2017 2:50 PM

## 2017-05-05 NOTE — Patient Instructions (Signed)
Medication Instructions:  Your physician has recommended you make the following change in your medication:  1) Decrease Furosemide to 20 mg daily for 5 days then go back to 40 mg daily after    Labwork: Your physician recommends that you return for lab work today: CBC   Testing/Procedures: None ordered   Follow-Up: Remote monitoring is used to monitor your Pacemaker from home. This monitoring reduces the number of office visits required to check your device to one time per year. It allows [place] to keep an eye on the functioning of your device to ensure it is working properly. You are scheduled for a device check from home on 08/04/17. You may send your transmission at any time that day. If you have a wireless device, the transmission will be sent automatically. After your physician reviews your transmission, you will receive a postcard with your next transmission date.    Your physician wants you to follow-up in: 12 months with Dr Vallery Ridge will receive a reminder letter in the mail two months in advance. If you don't receive a letter, please call our office to schedule the follow-up appointment.   Any Other Special Instructions Will Be Listed Below (If Applicable).     If you need a refill on your cardiac medications before your next appointment, please call your pharmacy.

## 2017-05-05 NOTE — Addendum Note (Signed)
Addended by: Janan Halter F on: 05/05/2017 03:07 PM   Modules accepted: Orders

## 2017-05-06 LAB — CBC WITH DIFFERENTIAL/PLATELET
Basophils Absolute: 0 10*3/uL (ref 0.0–0.2)
Basos: 0 %
EOS (ABSOLUTE): 0 10*3/uL (ref 0.0–0.4)
EOS: 0 %
HEMATOCRIT: 32.9 % — AB (ref 34.0–46.6)
Hemoglobin: 10.8 g/dL — ABNORMAL LOW (ref 11.1–15.9)
IMMATURE GRANULOCYTES: 0 %
Immature Grans (Abs): 0 10*3/uL (ref 0.0–0.1)
Lymphocytes Absolute: 3.2 10*3/uL — ABNORMAL HIGH (ref 0.7–3.1)
Lymphs: 35 %
MCH: 32.3 pg (ref 26.6–33.0)
MCHC: 32.8 g/dL (ref 31.5–35.7)
MCV: 99 fL — ABNORMAL HIGH (ref 79–97)
MONOS ABS: 0.5 10*3/uL (ref 0.1–0.9)
Monocytes: 5 %
NEUTROS PCT: 60 %
Neutrophils Absolute: 5.3 10*3/uL (ref 1.4–7.0)
Platelets: 222 10*3/uL (ref 150–379)
RBC: 3.34 x10E6/uL — AB (ref 3.77–5.28)
RDW: 15 % (ref 12.3–15.4)
WBC: 9 10*3/uL (ref 3.4–10.8)

## 2017-05-09 DIAGNOSIS — J188 Other pneumonia, unspecified organism: Secondary | ICD-10-CM | POA: Diagnosis not present

## 2017-05-09 DIAGNOSIS — J449 Chronic obstructive pulmonary disease, unspecified: Secondary | ICD-10-CM | POA: Diagnosis not present

## 2017-05-10 ENCOUNTER — Other Ambulatory Visit: Payer: Self-pay | Admitting: Nurse Practitioner

## 2017-05-10 DIAGNOSIS — Z720 Tobacco use: Secondary | ICD-10-CM

## 2017-05-10 DIAGNOSIS — E785 Hyperlipidemia, unspecified: Secondary | ICD-10-CM

## 2017-05-11 DIAGNOSIS — M79676 Pain in unspecified toe(s): Secondary | ICD-10-CM | POA: Diagnosis not present

## 2017-05-11 DIAGNOSIS — B351 Tinea unguium: Secondary | ICD-10-CM | POA: Diagnosis not present

## 2017-05-11 DIAGNOSIS — L84 Corns and callosities: Secondary | ICD-10-CM | POA: Diagnosis not present

## 2017-05-11 DIAGNOSIS — E1151 Type 2 diabetes mellitus with diabetic peripheral angiopathy without gangrene: Secondary | ICD-10-CM | POA: Diagnosis not present

## 2017-05-13 DIAGNOSIS — M67912 Unspecified disorder of synovium and tendon, left shoulder: Secondary | ICD-10-CM | POA: Diagnosis not present

## 2017-05-13 DIAGNOSIS — Z79899 Other long term (current) drug therapy: Secondary | ICD-10-CM | POA: Diagnosis not present

## 2017-05-14 LAB — CUP PACEART INCLINIC DEVICE CHECK
Brady Statistic RV Percent Paced: 6.3 %
Date Time Interrogation Session: 20180926183600
Implantable Lead Implant Date: 20120323
Implantable Lead Location: 753860
Implantable Lead Model: 1948
Implantable Pulse Generator Implant Date: 20120323
Lead Channel Pacing Threshold Pulse Width: 0.5 ms
Lead Channel Sensing Intrinsic Amplitude: 12 mV
Lead Channel Setting Sensing Sensitivity: 2 mV
MDC IDC MSMT BATTERY REMAINING LONGEVITY: 135 mo
MDC IDC MSMT BATTERY VOLTAGE: 2.95 V
MDC IDC MSMT LEADCHNL RV IMPEDANCE VALUE: 800 Ohm
MDC IDC MSMT LEADCHNL RV PACING THRESHOLD AMPLITUDE: 0.75 V
MDC IDC PG SERIAL: 7199163
MDC IDC SET LEADCHNL RV PACING AMPLITUDE: 2.5 V
MDC IDC SET LEADCHNL RV PACING PULSEWIDTH: 0.5 ms

## 2017-05-17 DIAGNOSIS — M12812 Other specific arthropathies, not elsewhere classified, left shoulder: Secondary | ICD-10-CM | POA: Diagnosis not present

## 2017-05-17 DIAGNOSIS — M75102 Unspecified rotator cuff tear or rupture of left shoulder, not specified as traumatic: Secondary | ICD-10-CM | POA: Diagnosis not present

## 2017-05-17 DIAGNOSIS — M25512 Pain in left shoulder: Secondary | ICD-10-CM | POA: Diagnosis not present

## 2017-05-17 DIAGNOSIS — G8929 Other chronic pain: Secondary | ICD-10-CM | POA: Diagnosis not present

## 2017-05-25 ENCOUNTER — Ambulatory Visit (INDEPENDENT_AMBULATORY_CARE_PROVIDER_SITE_OTHER): Payer: Medicare HMO | Admitting: Cardiovascular Disease

## 2017-05-25 ENCOUNTER — Encounter: Payer: Self-pay | Admitting: Cardiovascular Disease

## 2017-05-25 VITALS — BP 102/54 | HR 56 | Ht 62.0 in | Wt 130.0 lb

## 2017-05-25 DIAGNOSIS — I482 Chronic atrial fibrillation: Secondary | ICD-10-CM

## 2017-05-25 DIAGNOSIS — Z23 Encounter for immunization: Secondary | ICD-10-CM | POA: Diagnosis not present

## 2017-05-25 DIAGNOSIS — Z950 Presence of cardiac pacemaker: Secondary | ICD-10-CM

## 2017-05-25 DIAGNOSIS — R531 Weakness: Secondary | ICD-10-CM | POA: Diagnosis not present

## 2017-05-25 DIAGNOSIS — R5383 Other fatigue: Secondary | ICD-10-CM | POA: Diagnosis not present

## 2017-05-25 DIAGNOSIS — I779 Disorder of arteries and arterioles, unspecified: Secondary | ICD-10-CM

## 2017-05-25 DIAGNOSIS — I5032 Chronic diastolic (congestive) heart failure: Secondary | ICD-10-CM

## 2017-05-25 DIAGNOSIS — I350 Nonrheumatic aortic (valve) stenosis: Secondary | ICD-10-CM

## 2017-05-25 DIAGNOSIS — I10 Essential (primary) hypertension: Secondary | ICD-10-CM | POA: Diagnosis not present

## 2017-05-25 DIAGNOSIS — I739 Peripheral vascular disease, unspecified: Secondary | ICD-10-CM

## 2017-05-25 DIAGNOSIS — I4821 Permanent atrial fibrillation: Secondary | ICD-10-CM

## 2017-05-25 MED ORDER — FUROSEMIDE 40 MG PO TABS: 40.0000 mg | ORAL_TABLET | ORAL | Status: DC | PRN

## 2017-05-25 NOTE — Patient Instructions (Addendum)
Medication Instructions:   Change your Lasix to as needed.  Continue all other current medications.  Labwork:  BMET - do in 2 weeks - order given today.  Office will contact with results via phone or letter.    Testing/Procedures: none  Follow-Up: Your physician wants you to follow up in: 6 months.  You will receive a reminder letter in the mail one-two months in advance.  If you don't receive a letter, please call our office to schedule the follow up appointment   Any Other Special Instructions Will Be Listed Below (If Applicable).  If you need a refill on your cardiac medications before your next appointment, please call your pharmacy.

## 2017-05-25 NOTE — Progress Notes (Signed)
SUBJECTIVE: The patient presents for routine follow-up. She had hypotension and fatigue when she saw Dr. Rayann Heman in 05/05/17. He checked a CBC which I reviewed and demonstrated hemoglobin of 10.8 which was higher than baseline. He also reduce Lasix to 20 mg daily for 5 days.  In summary, she has COPD, chronic diastolic heart failure, permanent atrial fibrillation/flutter and has a pacemaker.   She also has a h/o mild nonobstructive CAD and a previous tachycardia-induced cardiomyopathy which has since normalized in function.   Coronary angiography on 04/04/2010 demonstrated a 40% proximal LAD lesion, 20% mid left circumflex coronary artery lesion, and the RCA had a 20% proximal, 50-60% mid vessel, and distal 30% stenosis.  Echocardiogram 04/29/16 normal left ventricular systolic function, EF 33-82%, severe left atrial enlargement, mild mitral regurgitation, mild calcific aortic stenosis, mean gradient 11 mmHg, mildly dilated right ventricle with mildly reduced contraction, mild tricuspid regurgitation, PA S/P 42 mmHg, and moderate right atrial enlargement.  Nuclear stress test 03/24/16 showed no perfusion defects consistent with prior infarct or recurrent ischemia.  She has been feeling weak and tired for the past 2 months. She denies chest pain and worsening of chronic exertional dyspnea.  After reducing Lasix to 20 mg daily, she felt no improvement in her symptoms.  On 03/23/17, BUN 21, creatinine 1.78. 9 months prior, BUN was 32 and creatinine was 1.09.  ECG performed in the office today which I ordered and personally interpreted demonstrated atrial flutter with variable conduction, 64 bpm, left axis deviation, and right bundle-branch block, with old anteroseptal infarct.  Review of Systems: As per "subjective", otherwise negative.  Allergies  Allergen Reactions  . Macrobid [Nitrofurantoin Monohyd Macro] Rash  . Torsemide Nausea And Vomiting  . Tramadol Nausea Only    Current  Outpatient Prescriptions  Medication Sig Dispense Refill  . acetaminophen (TYLENOL) 500 MG tablet Take 1,000 mg by mouth every 6 (six) hours as needed for mild pain, moderate pain or fever.     . Blood Glucose Calibration (TRUE METRIX LEVEL 1) LOW SOLN Use weekly 3 each 1  . Blood Glucose Monitoring Suppl (TRUE METRIX AIR GLUCOSE METER) DEVI 1 Device by Does not apply route 2 (two) times daily. Use to Test blood sugar bid. DX E13.10 1 Device 0  . calcium-vitamin D 250-100 MG-UNIT tablet Take 2 tablets by mouth daily.    . clonazePAM (KLONOPIN) 0.5 MG tablet TAKE ONE (1) TABLET THREE (3) TIMES EACH DAY 90 tablet 2  . CREON 36000 units CPEP capsule TAKE 3 CAPSULES WITH MEALS  AND TAKE 1 CAPSULE WITH A SNACK AS DIRECTED 900 capsule 2  . denosumab (PROLIA) 60 MG/ML SOLN injection INJECT 60 MG SQ every 6 months. 1 mL 0  . diltiazem (CARDIZEM) 120 MG tablet TAKE 1 TABLET EVERY DAY 90 tablet 0  . ferrous sulfate 325 (65 FE) MG tablet Take 1 tablet (325 mg total) by mouth daily with breakfast.  3  . furosemide (LASIX) 40 MG tablet TAKE 1 TABLET EVERY DAY 90 tablet 1  . HYDROcodone-acetaminophen (NORCO) 7.5-325 MG tablet Take 1 tablet by mouth 3 (three) times daily as needed (pain).     [name] ipratropium-albuterol (DUONEB) 0.5-2.5 (3) MG/3ML SOLN INHALE THE CONTENTS OF 1 VIAL VIA NEBULIZER EVERY 4 HOURS AS NEEDED 1080 mL 1  . JANUVIA 50 MG tablet TAKE 1 TABLET EVERY DAY 90 tablet 1  . nitroGLYCERIN (NITROSTAT) 0.4 MG SL tablet Place 1 tablet (0.4 mg total) under the tongue every 5 (  five) minutes as needed for chest pain. 25 tablet 3  . OXYGEN Inhale 2 L into the lungs at bedtime. 2lpm with sleep only     . pantoprazole (PROTONIX) 40 MG tablet TAKE 1 TABLET TWICE DAILY 180 tablet 1  . potassium chloride SA (K-DUR,KLOR-CON) 20 MEQ tablet Take 1 tablet (20 mEq total) by mouth every morning. 90 tablet 1  . Probiotic Product ([place]) CAPS Take 1 capsule by mouth daily.    . simvastatin (ZOCOR) 10 MG  tablet TAKE 1 TABLET EVERY DAY 90 tablet 1  . SYMBICORT 160-4.5 MCG/ACT inhaler INHALE 2 PUFFS TWICE DAILY 3 Inhaler 1  . TRUE METRIX BLOOD GLUCOSE TEST test strip TEST TWICE DAILY AS DIRECTED 100 each 2  . TRUEPLUS LANCETS 28G MISC TEST BLOOD SUGAR TWICE DAILY 200 each 1  . VENTOLIN HFA 108 (90 Base) MCG/ACT inhaler USE 2 PUFFS EVERY 6 HOURS AS NEEDED 18 g 4  . warfarin (COUMADIN) 2 MG tablet TAKE PER PHYSICIAN INSTRUCTIONS 135 tablet 1   No current facility-administered medications for this visit.     Past Medical History:  Diagnosis Date  . Abnormal CT of the chest    Mediastinal and hilar adenopathy followed by Dr. Koleen Nimrod in [place]  . Anemia, unspecified   . Body mass index (BMI) of 20.0-20.9 in adult FEB 2013 147 LBS  . CAD (coronary artery disease) 2011   Catheterization August 2011: mild nonobstructive coronary artery disease complicated by large left rectus sheath hematoma status post evacuation Coumadin restarted without recurrence  . Carotid artery disease ([place])    Doppler, 05/2012, 0-39% bilateral  . Cataract   . Chronic airway obstruction, not elsewhere classified   . Chronic kidney disease, stage II (mild)   . Congestive heart failure, unspecified    EF now 60%, previous nonischemic cardiomyopathy  . Diabetes mellitus   . Dilated bile duct 10/19/2011  . Diverticulitis Dec 2012  . Gastritis    By EGD  . GERD (gastroesophageal reflux disease)   . Hypercholesterolemia   . Hypertension   . Mitral regurgitation 06/27/2013  . Neurogenic sleep apnea    Uses noctural oxygen prescribed by her pulmonologi  . Pacemaker    Single chamber [place]. Jude ACCENT [phone] SN 719 04/11/1959 10/31/2010  . Permanent atrial fibrillation (HCC)    H/o tachybradycardia syndrome on Coumadin anticoagulation, has pacemaker.  . Recurrent UTI   . Rheumatic heart disease   . Tachycardia-bradycardia [facility])    s/p St. Jude single chamber PPM 10/2010   . Unspecified hypertensive kidney disease with  chronic kidney disease stage I through stage IV, or unspecified(403.90)   . Valvular heart disease    a. H/o moderate mitral stenosis by cath 2011 but no mention of this on 10/2011 echo. b. 10/2011 echo: mod MR, mild AS, mild TR; EF>65%  . Vitamin D deficiency     Past Surgical History:  Procedure Laterality Date  . CHOLECYSTECTOMY     40+ years ago  . COLONOSCOPY  10/2011   Dyann Ruddle sigmoid to descending diverticulosis, internal hemorrhoids  . ESOPHAGOGASTRODUODENOSCOPY  10/2011   Fields-erosive gastritis, biopsy with no H. pylori, hiatal hernia, peptic duodenitis, no celiac disease, duodenal diverticulum, duodenal nodule  . EUS  11/16/11   Mishra-13 MM CBD, normal pancreatic duct, otherwise normal EUS  . Evacuation of retroperitoneal and left rectus sheath    . HEMATOMA EVACUATION     femoral  . HERNIA REPAIR     X3  . PILONIDAL CYST /  SINUS EXCISION    . Single-chamber pacemaker implantation  3/12   by Dr Caryl Comes    Social History   Social History  . Marital status: Single    Spouse name: N/A  . Number of children: 2  . Years of education: N/A   Occupational History  . RETIRED Retired   Social History Main Topics  . Smoking status: Current Every Day Smoker    Packs/day: 0.25    Years: 45.00    Types: Cigarettes    Start date: 02/05/1958  . Smokeless tobacco: Never Used     Comment: smokes 5-6 cigarettes daily  . Alcohol use No  . Drug use: No  . Sexual activity: Not Currently   Other Topics Concern  . Not on file   Social History Narrative  . No narrative on file     Vitals:   05/25/17 1352  BP: (!) 102/54  Pulse: (!) 56  SpO2: 95%  Weight: 130 lb (59 kg)  Height: 5\' 2"  (1.575 m)    Wt Readings from Last 3 Encounters:  05/25/17 130 lb (59 kg)  05/05/17 137 lb 6.4 oz (62.3 kg)  04/20/17 137 lb (62.1 kg)     PHYSICAL EXAM General: NAD HEENT: Poor dentition Neck: No JVD, no thyromegaly. Lungs: No wheezes or crackles, poor air entry. CV:  Nondisplaced PMI. Regular rate and irregular rhythm, normal S1/S2, no S3, II/VI ejection systolic murmur heard at RUSB and LUSB. No pretibial or periankle edema. Chronic stasis dermatitis. Abdomen: Soft, nontender, no distention.  Neurologic: Alert and oriented.  Psych: Normal affect. Skin: Normal. Musculoskeletal: No gross deformities.    ECG: Most recent ECG reviewed.   Labs: Lab Results  Component Value Date/Time   K 4.1 04/20/2017 12:18 PM   K 4.5 04/02/2012 07:39 AM   BUN 23 04/20/2017 12:18 PM   CREATININE 1.75 (H) 04/20/2017 12:18 PM   CREATININE 1.38 (H) 01/18/2013 12:42 PM   ALT 21 04/20/2017 12:18 PM   TSH 3.150 11/01/2015 11:10 AM   HGB 10.8 (L) 05/05/2017 03:14 PM     Lipids: Lab Results  Component Value Date/Time   LDLCALC 59 03/23/2017 11:46 AM   LDLCALC 66 05/21/2014 01:21 PM   LDLCALC 78 01/18/2013 12:42 PM   CHOL 106 03/23/2017 11:46 AM   CHOL 125 01/18/2013 12:42 PM   TRIG 93 03/23/2017 11:46 AM   TRIG 102 12/31/2014 02:53 PM   TRIG 68 01/18/2013 12:42 PM   HDL 28 (L) 03/23/2017 11:46 AM   HDL 25 (L) 12/31/2014 02:53 PM   HDL 33 (L) 01/18/2013 12:42 PM       ASSESSMENT AND PLAN:  1. Permanent atrial fibrillation and flutter s/p pacemaker: Continue long-acting diltiazem 120 mg. I am not inclined to reintroduce metoprolol in the future should palpitations recur given her severe COPD. Continue warfarin. No bleeding complications noted.   2. Valvular heart disease: Mild aortic stenosis and mitral and tricuspid regurgitation as noted above. Will monitor.  3. Carotid artery stenosis: Mild bilaterally in October 2013.   4. Chronic diastolic heart failure: Euvolemic. Given weakness and fatigue, I will change Lasix to be used as needed. I will check a basic metabolic panel in 2 weeks.  5. Tobacco abuse: Cessation counseling previously provided.  6. Type II diabetes: On Januvia.   7. COPD: Not decompensated at present.  8. CAD: RCA lesion  was 50-60% in 2011. Dobutamine Myoview stress test showed no ischemia or scar. Continue SL nitroglycerin to be used prn.  Continue statin.  9. Hypercholesterolemia: Lipids reviewed above. Continue simvastatin.  10. Weakness and fatigue: I will change Lasix to be used as needed and check a basic metabolic panel in 2 weeks. Renal function reviewed above. She may be dehydrated to some degree.   Disposition: Follow up 6 months.   Kate Sable, M.D., F.A.C.C.

## 2017-06-01 DIAGNOSIS — E1151 Type 2 diabetes mellitus with diabetic peripheral angiopathy without gangrene: Secondary | ICD-10-CM | POA: Diagnosis not present

## 2017-06-01 DIAGNOSIS — L84 Corns and callosities: Secondary | ICD-10-CM | POA: Diagnosis not present

## 2017-06-01 DIAGNOSIS — M79676 Pain in unspecified toe(s): Secondary | ICD-10-CM | POA: Diagnosis not present

## 2017-06-01 DIAGNOSIS — B351 Tinea unguium: Secondary | ICD-10-CM | POA: Diagnosis not present

## 2017-06-03 ENCOUNTER — Other Ambulatory Visit: Payer: Self-pay | Admitting: Nurse Practitioner

## 2017-06-03 ENCOUNTER — Other Ambulatory Visit: Payer: Self-pay | Admitting: Cardiovascular Disease

## 2017-06-03 ENCOUNTER — Telehealth: Payer: Self-pay | Admitting: Nurse Practitioner

## 2017-06-03 NOTE — Telephone Encounter (Signed)
Patient needed to reschedule protime appointment for tomorrow. Patient rescheduled for Wednesday 10/31 @ 1:30pm.

## 2017-06-04 ENCOUNTER — Encounter: Payer: Self-pay | Admitting: Pharmacist Clinician (PhC)/ Clinical Pharmacy Specialist

## 2017-06-08 DIAGNOSIS — J188 Other pneumonia, unspecified organism: Secondary | ICD-10-CM | POA: Diagnosis not present

## 2017-06-08 DIAGNOSIS — J449 Chronic obstructive pulmonary disease, unspecified: Secondary | ICD-10-CM | POA: Diagnosis not present

## 2017-06-09 ENCOUNTER — Other Ambulatory Visit: Payer: Self-pay | Admitting: Cardiovascular Disease

## 2017-06-09 ENCOUNTER — Ambulatory Visit (INDEPENDENT_AMBULATORY_CARE_PROVIDER_SITE_OTHER): Payer: Medicare HMO | Admitting: *Deleted

## 2017-06-09 DIAGNOSIS — I4821 Permanent atrial fibrillation: Secondary | ICD-10-CM

## 2017-06-09 DIAGNOSIS — I482 Chronic atrial fibrillation: Secondary | ICD-10-CM | POA: Diagnosis not present

## 2017-06-09 DIAGNOSIS — I10 Essential (primary) hypertension: Secondary | ICD-10-CM | POA: Diagnosis not present

## 2017-06-09 DIAGNOSIS — I5032 Chronic diastolic (congestive) heart failure: Secondary | ICD-10-CM | POA: Diagnosis not present

## 2017-06-09 LAB — COAGUCHEK XS/INR WAIVED
INR: 2 — AB (ref 0.9–1.1)
Prothrombin Time: 24.2 s

## 2017-06-09 NOTE — Patient Instructions (Addendum)
Anticoagulation Warfarin Dose Instructions as of 06/09/2017      [name] Tue Wed Thu Fri Sat   New Dose 1 mg 1 mg 1 mg 1 mg 1 mg 2 mg 1 mg    Description   Continue current warfarin 2mg  tablets - take 1 tablet on Fridays. Take 1/2 tablet all other days  INR is 2.0 (goal is 2.0 to 3.0)    Please return for recheck of your protime in 1 month 07/07/17 at 1:30 pm.

## 2017-06-09 NOTE — Progress Notes (Signed)
Subjective:     Indication: atrial fibrillation Bleeding signs/symptoms: None Thromboembolic signs/symptoms: None  Missed Coumadin doses: None Medication changes: no Dietary changes: no Bacterial/viral infection: no Other concerns: no      Objective:    INR Today: 2.0 Current dose: Continue current warfarin 2mg  tablets - take 1 tablet on Fridays. Take 1/2 tablet all other days  Assessment:    Therapeutic INR for goal of 2-3   Plan:    1. New dose: no change   2. Next INR: 1 month     Discussed plan with Dr. Evette Doffing.

## 2017-06-10 LAB — BASIC METABOLIC PANEL
BUN/Creatinine Ratio: 13 (ref 12–28)
BUN: 18 mg/dL (ref 8–27)
CALCIUM: 9 mg/dL (ref 8.7–10.3)
CO2: 21 mmol/L (ref 20–29)
Chloride: 102 mmol/L (ref 96–106)
Creatinine, Ser: 1.39 mg/dL — ABNORMAL HIGH (ref 0.57–1.00)
GFR calc Af Amer: 41 mL/min/{1.73_m2} — ABNORMAL LOW (ref >59)
GFR, EST NON AFRICAN AMERICAN: 36 mL/min/{1.73_m2} — AB (ref >59)
Glucose: 120 mg/dL — ABNORMAL HIGH (ref 65–99)
POTASSIUM: 4.9 mmol/L (ref 3.5–5.2)
Sodium: 144 mmol/L (ref 134–144)

## 2017-06-11 DIAGNOSIS — M5441 Lumbago with sciatica, right side: Secondary | ICD-10-CM | POA: Diagnosis not present

## 2017-06-11 DIAGNOSIS — G8929 Other chronic pain: Secondary | ICD-10-CM | POA: Diagnosis not present

## 2017-06-11 DIAGNOSIS — Z79899 Other long term (current) drug therapy: Secondary | ICD-10-CM | POA: Diagnosis not present

## 2017-06-14 ENCOUNTER — Telehealth: Payer: Self-pay | Admitting: Cardiovascular Disease

## 2017-06-14 ENCOUNTER — Encounter: Payer: Self-pay | Admitting: *Deleted

## 2017-06-14 NOTE — Telephone Encounter (Signed)
Patient notified on stable BMET.  Letter already put in mail for notification.

## 2017-06-14 NOTE — Telephone Encounter (Signed)
[name] called stating that she is returning a call to [place].

## 2017-07-02 ENCOUNTER — Other Ambulatory Visit: Payer: Self-pay | Admitting: Nurse Practitioner

## 2017-07-07 ENCOUNTER — Encounter: Payer: Self-pay | Admitting: *Deleted

## 2017-07-07 DIAGNOSIS — Z79899 Other long term (current) drug therapy: Secondary | ICD-10-CM | POA: Diagnosis not present

## 2017-07-07 DIAGNOSIS — M5441 Lumbago with sciatica, right side: Secondary | ICD-10-CM | POA: Diagnosis not present

## 2017-07-07 DIAGNOSIS — G8929 Other chronic pain: Secondary | ICD-10-CM | POA: Diagnosis not present

## 2017-07-09 DIAGNOSIS — J449 Chronic obstructive pulmonary disease, unspecified: Secondary | ICD-10-CM | POA: Diagnosis not present

## 2017-07-09 DIAGNOSIS — J188 Other pneumonia, unspecified organism: Secondary | ICD-10-CM | POA: Diagnosis not present

## 2017-07-12 ENCOUNTER — Other Ambulatory Visit: Payer: Self-pay | Admitting: Nurse Practitioner

## 2017-07-13 ENCOUNTER — Other Ambulatory Visit: Payer: Self-pay | Admitting: Nurse Practitioner

## 2017-07-13 ENCOUNTER — Ambulatory Visit (INDEPENDENT_AMBULATORY_CARE_PROVIDER_SITE_OTHER): Payer: Medicare HMO

## 2017-07-13 ENCOUNTER — Ambulatory Visit (INDEPENDENT_AMBULATORY_CARE_PROVIDER_SITE_OTHER): Payer: Medicare HMO | Admitting: Family

## 2017-07-13 ENCOUNTER — Encounter: Payer: Self-pay | Admitting: Family

## 2017-07-13 VITALS — BP 117/56 | HR 69 | Temp 97.7°F | Ht 62.0 in | Wt 134.0 lb

## 2017-07-13 DIAGNOSIS — R197 Diarrhea, unspecified: Secondary | ICD-10-CM

## 2017-07-13 DIAGNOSIS — R1084 Generalized abdominal pain: Secondary | ICD-10-CM | POA: Diagnosis not present

## 2017-07-13 DIAGNOSIS — F419 Anxiety disorder, unspecified: Secondary | ICD-10-CM

## 2017-07-13 DIAGNOSIS — A084 Viral intestinal infection, unspecified: Secondary | ICD-10-CM | POA: Diagnosis not present

## 2017-07-13 DIAGNOSIS — K219 Gastro-esophageal reflux disease without esophagitis: Secondary | ICD-10-CM

## 2017-07-13 NOTE — Patient Instructions (Signed)

## 2017-07-13 NOTE — Progress Notes (Signed)
   Subjective:    Patient ID: [name], female    DOB: Dec 08, 1934, 82 y.o.   MRN: [id number]  Gastroesophageal Reflux  She complains of abdominal pain, belching, heartburn and wheezing. She reports no nausea. This is a chronic problem. The current episode started more than 1 year ago. The problem occurs frequently. The problem has been waxing and waning. She has tried a PPI for the symptoms. The treatment provided mild relief.  Abdominal Pain  This is a new problem. The current episode started yesterday. The onset quality is sudden. The problem occurs constantly. The pain is located in the generalized abdominal region. The pain is at a severity of 10/10. The pain is moderate. The quality of the pain is burning. The abdominal pain does not radiate. Associated symptoms include belching and diarrhea. Pertinent negatives include no dysuria, frequency, hematuria or nausea. Her past medical history is significant for GERD.      Review of Systems  Respiratory: Positive for wheezing.   Gastrointestinal: Positive for abdominal pain, diarrhea and heartburn. Negative for nausea.  Genitourinary: Negative for dysuria, frequency and hematuria.  All other systems reviewed and are negative.      Objective:   Physical Exam  Constitutional: She is oriented to person, place, and time. She appears well-developed and well-nourished. No distress.  HENT:  Head: Normocephalic.  Eyes: Pupils are equal, round, and reactive to light.  Neck: Normal range of motion. Neck supple. No thyromegaly present.  Cardiovascular: Normal rate, regular rhythm, normal heart sounds and intact distal pulses.  No murmur heard. Pulmonary/Chest: Effort normal and breath sounds normal. No respiratory distress. She has no wheezes.  Abdominal: Soft. She exhibits no distension. There is tenderness (mild generalized ).  Hyperactive BS  Musculoskeletal: Normal range of motion. She exhibits no edema or tenderness.  Neurological:  She is alert and oriented to person, place, and time.  Skin: Skin is warm and dry.  Psychiatric: She has a normal mood and affect. Her behavior is normal. Judgment and thought content normal.  Vitals reviewed.   KUB- Gas, negative Preliminary reading by Evelina Dun, FNP [facility]   BP (!) 117/56   Pulse 69   Temp 97.7 F (36.5 C) (Oral)   Ht _0  (1.575 m)   Wt 134 lb (60.8 kg)   BMI 24.51 kg/m      Assessment & Plan:  1. Generalized abdominal pain - DG Abd 1 View; Future - CBC with Differential/Platelet - BMP8+EGFR  2. Gastroesophageal reflux disease, esophagitis presence not specified Continue PPI -Diet discussed- Avoid fried, spicy, citrus foods, caffeine and alcohol -Do not eat 2-3 hours before bedtime -Encouraged small frequent meals -Avoid NSAID's - DG Abd 1 View; Future - CBC with Differential/Platelet - BMP8+EGFR  3. Diarrhea, unspecified type - DG Abd 1 View; Future - CBC with Differential/Platelet - BMP8+EGFR  4. Viral gastroenteritis Force fluids Rest BRAT diet Labs pending RTO prn or if symptoms do not improve   Evelina Dun, FNP

## 2017-07-14 LAB — CBC WITH DIFFERENTIAL/PLATELET
BASOS ABS: 0 10*3/uL (ref 0.0–0.2)
BASOS: 0 %
EOS (ABSOLUTE): 0 10*3/uL (ref 0.0–0.4)
Eos: 1 %
Hematocrit: 35.5 % (ref 34.0–46.6)
Hemoglobin: 11.7 g/dL (ref 11.1–15.9)
IMMATURE GRANS (ABS): 0 10*3/uL (ref 0.0–0.1)
Immature Granulocytes: 0 %
LYMPHS ABS: 1.9 10*3/uL (ref 0.7–3.1)
LYMPHS: 25 %
MCH: 32 pg (ref 26.6–33.0)
MCHC: 33 g/dL (ref 31.5–35.7)
MCV: 97 fL (ref 79–97)
Monocytes Absolute: 0.5 10*3/uL (ref 0.1–0.9)
Monocytes: 6 %
NEUTROS ABS: 5.4 10*3/uL (ref 1.4–7.0)
Neutrophils: 68 %
Platelets: 213 10*3/uL (ref 150–379)
RBC: 3.66 x10E6/uL — ABNORMAL LOW (ref 3.77–5.28)
RDW: 14.1 % (ref 12.3–15.4)
WBC: 7.9 10*3/uL (ref 3.4–10.8)

## 2017-07-14 LAB — BMP8+EGFR
BUN / CREAT RATIO: 9 — AB (ref 12–28)
BUN: 13 mg/dL (ref 8–27)
CHLORIDE: 104 mmol/L (ref 96–106)
CO2: 23 mmol/L (ref 20–29)
Calcium: 9.5 mg/dL (ref 8.7–10.3)
Creatinine, Ser: 1.39 mg/dL — ABNORMAL HIGH (ref 0.57–1.00)
GFR calc non Af Amer: 36 mL/min/{1.73_m2} — ABNORMAL LOW (ref >59)
GFR, EST AFRICAN AMERICAN: 41 mL/min/{1.73_m2} — AB (ref >59)
GLUCOSE: 127 mg/dL — AB (ref 65–99)
POTASSIUM: 3.9 mmol/L (ref 3.5–5.2)
SODIUM: 143 mmol/L (ref 134–144)

## 2017-07-14 NOTE — Telephone Encounter (Signed)
Seen yesterday Christy  MMM PCP  If approved route to nurse to call into [facility]

## 2017-07-14 NOTE — Telephone Encounter (Signed)
Please call in klonopin with 1 refills 

## 2017-07-14 NOTE — Telephone Encounter (Signed)
Rx phoned in.   

## 2017-07-20 ENCOUNTER — Other Ambulatory Visit: Payer: Self-pay | Admitting: *Deleted

## 2017-07-20 MED ORDER — GLUCOSE BLOOD VI STRP: ORAL_STRIP | 5 refills | Status: DC

## 2017-08-04 ENCOUNTER — Encounter: Payer: Medicare HMO | Admitting: *Deleted

## 2017-08-04 ENCOUNTER — Telehealth: Payer: Self-pay | Admitting: Cardiology

## 2017-08-04 DIAGNOSIS — Z79899 Other long term (current) drug therapy: Secondary | ICD-10-CM | POA: Diagnosis not present

## 2017-08-04 DIAGNOSIS — M5441 Lumbago with sciatica, right side: Secondary | ICD-10-CM | POA: Diagnosis not present

## 2017-08-04 DIAGNOSIS — M75102 Unspecified rotator cuff tear or rupture of left shoulder, not specified as traumatic: Secondary | ICD-10-CM | POA: Diagnosis not present

## 2017-08-04 DIAGNOSIS — G8929 Other chronic pain: Secondary | ICD-10-CM | POA: Diagnosis not present

## 2017-08-04 NOTE — Telephone Encounter (Signed)
LMOVM reminding pt to send remote transmission.   

## 2017-08-05 ENCOUNTER — Telehealth: Payer: Self-pay | Admitting: Nurse Practitioner

## 2017-08-05 ENCOUNTER — Other Ambulatory Visit: Payer: Self-pay | Admitting: Nurse Practitioner

## 2017-08-05 ENCOUNTER — Encounter: Payer: Self-pay | Admitting: Cardiology

## 2017-08-07 ENCOUNTER — Other Ambulatory Visit: Payer: Self-pay

## 2017-08-07 ENCOUNTER — Encounter (HOSPITAL_COMMUNITY): Payer: Self-pay

## 2017-08-07 ENCOUNTER — Emergency Department (HOSPITAL_COMMUNITY): Payer: Medicare HMO

## 2017-08-07 ENCOUNTER — Inpatient Hospital Stay (HOSPITAL_COMMUNITY)
Admission: EM | Admit: 2017-08-07 | Discharge: 2017-08-09 | DRG: 291 | Disposition: A | Discharge status: Home Health | Payer: Medicare HMO | Attending: Internal Medicine | Admitting: Internal Medicine

## 2017-08-07 DIAGNOSIS — I340 Nonrheumatic mitral (valve) insufficiency: Secondary | ICD-10-CM | POA: Diagnosis present

## 2017-08-07 DIAGNOSIS — Z8249 Family history of ischemic heart disease and other diseases of the circulatory system: Secondary | ICD-10-CM

## 2017-08-07 DIAGNOSIS — Z7901 Long term (current) use of anticoagulants: Secondary | ICD-10-CM | POA: Diagnosis not present

## 2017-08-07 DIAGNOSIS — Z803 Family history of malignant neoplasm of breast: Secondary | ICD-10-CM | POA: Diagnosis not present

## 2017-08-07 DIAGNOSIS — I10 Essential (primary) hypertension: Secondary | ICD-10-CM | POA: Diagnosis present

## 2017-08-07 DIAGNOSIS — J189 Pneumonia, unspecified organism: Secondary | ICD-10-CM | POA: Diagnosis not present

## 2017-08-07 DIAGNOSIS — E785 Hyperlipidemia, unspecified: Secondary | ICD-10-CM | POA: Diagnosis present

## 2017-08-07 DIAGNOSIS — I5033 Acute on chronic diastolic (congestive) heart failure: Secondary | ICD-10-CM | POA: Diagnosis not present

## 2017-08-07 DIAGNOSIS — I130 Hypertensive heart and chronic kidney disease with heart failure and stage 1 through stage 4 chronic kidney disease, or unspecified chronic kidney disease: Principal | ICD-10-CM | POA: Diagnosis present

## 2017-08-07 DIAGNOSIS — G4739 Other sleep apnea: Secondary | ICD-10-CM | POA: Diagnosis present

## 2017-08-07 DIAGNOSIS — F17210 Nicotine dependence, cigarettes, uncomplicated: Secondary | ICD-10-CM | POA: Diagnosis present

## 2017-08-07 DIAGNOSIS — Z885 Allergy status to narcotic agent status: Secondary | ICD-10-CM

## 2017-08-07 DIAGNOSIS — E1121 Type 2 diabetes mellitus with diabetic nephropathy: Secondary | ICD-10-CM | POA: Diagnosis not present

## 2017-08-07 DIAGNOSIS — J449 Chronic obstructive pulmonary disease, unspecified: Secondary | ICD-10-CM | POA: Diagnosis not present

## 2017-08-07 DIAGNOSIS — D649 Anemia, unspecified: Secondary | ICD-10-CM

## 2017-08-07 DIAGNOSIS — I4892 Unspecified atrial flutter: Secondary | ICD-10-CM | POA: Diagnosis present

## 2017-08-07 DIAGNOSIS — Z950 Presence of cardiac pacemaker: Secondary | ICD-10-CM

## 2017-08-07 DIAGNOSIS — K219 Gastro-esophageal reflux disease without esophagitis: Secondary | ICD-10-CM | POA: Diagnosis present

## 2017-08-07 DIAGNOSIS — Z720 Tobacco use: Secondary | ICD-10-CM | POA: Diagnosis present

## 2017-08-07 DIAGNOSIS — I482 Chronic atrial fibrillation: Secondary | ICD-10-CM | POA: Diagnosis present

## 2017-08-07 DIAGNOSIS — Z79891 Long term (current) use of opiate analgesic: Secondary | ICD-10-CM

## 2017-08-07 DIAGNOSIS — R0682 Tachypnea, not elsewhere classified: Secondary | ICD-10-CM | POA: Diagnosis not present

## 2017-08-07 DIAGNOSIS — J9601 Acute respiratory failure with hypoxia: Secondary | ICD-10-CM | POA: Diagnosis present

## 2017-08-07 DIAGNOSIS — E7800 Pure hypercholesterolemia, unspecified: Secondary | ICD-10-CM | POA: Diagnosis present

## 2017-08-07 DIAGNOSIS — I4821 Permanent atrial fibrillation: Secondary | ICD-10-CM | POA: Diagnosis present

## 2017-08-07 DIAGNOSIS — Z801 Family history of malignant neoplasm of trachea, bronchus and lung: Secondary | ICD-10-CM

## 2017-08-07 DIAGNOSIS — N182 Chronic kidney disease, stage 2 (mild): Secondary | ICD-10-CM | POA: Diagnosis present

## 2017-08-07 DIAGNOSIS — J188 Other pneumonia, unspecified organism: Secondary | ICD-10-CM | POA: Diagnosis not present

## 2017-08-07 DIAGNOSIS — J441 Chronic obstructive pulmonary disease with (acute) exacerbation: Secondary | ICD-10-CM | POA: Diagnosis present

## 2017-08-07 DIAGNOSIS — D638 Anemia in other chronic diseases classified elsewhere: Secondary | ICD-10-CM | POA: Diagnosis present

## 2017-08-07 DIAGNOSIS — J440 Chronic obstructive pulmonary disease with acute lower respiratory infection: Secondary | ICD-10-CM | POA: Diagnosis present

## 2017-08-07 DIAGNOSIS — I428 Other cardiomyopathies: Secondary | ICD-10-CM | POA: Diagnosis not present

## 2017-08-07 DIAGNOSIS — I2510 Atherosclerotic heart disease of native coronary artery without angina pectoris: Secondary | ICD-10-CM | POA: Diagnosis present

## 2017-08-07 DIAGNOSIS — Z8049 Family history of malignant neoplasm of other genital organs: Secondary | ICD-10-CM

## 2017-08-07 DIAGNOSIS — E1122 Type 2 diabetes mellitus with diabetic chronic kidney disease: Secondary | ICD-10-CM | POA: Diagnosis present

## 2017-08-07 DIAGNOSIS — Z8051 Family history of malignant neoplasm of kidney: Secondary | ICD-10-CM | POA: Diagnosis not present

## 2017-08-07 DIAGNOSIS — Z7951 Long term (current) use of inhaled steroids: Secondary | ICD-10-CM

## 2017-08-07 DIAGNOSIS — E876 Hypokalemia: Secondary | ICD-10-CM | POA: Diagnosis present

## 2017-08-07 DIAGNOSIS — Z9049 Acquired absence of other specified parts of digestive tract: Secondary | ICD-10-CM

## 2017-08-07 DIAGNOSIS — Z79899 Other long term (current) drug therapy: Secondary | ICD-10-CM

## 2017-08-07 DIAGNOSIS — Z823 Family history of stroke: Secondary | ICD-10-CM

## 2017-08-07 DIAGNOSIS — E559 Vitamin D deficiency, unspecified: Secondary | ICD-10-CM | POA: Diagnosis present

## 2017-08-07 DIAGNOSIS — Z9981 Dependence on supplemental oxygen: Secondary | ICD-10-CM

## 2017-08-07 DIAGNOSIS — R079 Chest pain, unspecified: Secondary | ICD-10-CM | POA: Diagnosis not present

## 2017-08-07 DIAGNOSIS — Z888 Allergy status to other drugs, medicaments and biological substances status: Secondary | ICD-10-CM

## 2017-08-07 DIAGNOSIS — Z87440 Personal history of urinary (tract) infections: Secondary | ICD-10-CM

## 2017-08-07 DIAGNOSIS — Z7984 Long term (current) use of oral hypoglycemic drugs: Secondary | ICD-10-CM

## 2017-08-07 DIAGNOSIS — E1129 Type 2 diabetes mellitus with other diabetic kidney complication: Secondary | ICD-10-CM | POA: Diagnosis present

## 2017-08-07 LAB — CBC WITH DIFFERENTIAL/PLATELET
BASOS PCT: 0 %
Basophils Absolute: 0 10*3/uL (ref 0.0–0.1)
EOS ABS: 0.1 10*3/uL (ref 0.0–0.7)
Eosinophils Relative: 1 %
HCT: 31.6 % — ABNORMAL LOW (ref 36.0–46.0)
Hemoglobin: 9.9 g/dL — ABNORMAL LOW (ref 12.0–15.0)
Lymphocytes Relative: 12 %
Lymphs Abs: 1.1 10*3/uL (ref 0.7–4.0)
MCH: 30.8 pg (ref 26.0–34.0)
MCHC: 31.3 g/dL (ref 30.0–36.0)
MCV: 98.4 fL (ref 78.0–100.0)
MONO ABS: 0.7 10*3/uL (ref 0.1–1.0)
MONOS PCT: 7 %
Neutro Abs: 7.7 10*3/uL (ref 1.7–7.7)
Neutrophils Relative %: 80 %
Platelets: 214 10*3/uL (ref 150–400)
RBC: 3.21 MIL/uL — ABNORMAL LOW (ref 3.87–5.11)
RDW: 13.6 % (ref 11.5–15.5)
WBC: 9.6 10*3/uL (ref 4.0–10.5)

## 2017-08-07 LAB — BASIC METABOLIC PANEL
Anion gap: 12 (ref 5–15)
BUN: 17 mg/dL (ref 6–20)
CALCIUM: 9.8 mg/dL (ref 8.9–10.3)
CO2: 24 mmol/L (ref 22–32)
CREATININE: 1.22 mg/dL — AB (ref 0.44–1.00)
Chloride: 104 mmol/L (ref 101–111)
GFR calc non Af Amer: 41 mL/min — ABNORMAL LOW (ref >60)
GFR, EST AFRICAN AMERICAN: 47 mL/min — AB (ref >60)
Glucose, Bld: 129 mg/dL — ABNORMAL HIGH (ref 65–99)
Potassium: 3.3 mmol/L — ABNORMAL LOW (ref 3.5–5.1)
SODIUM: 140 mmol/L (ref 135–145)

## 2017-08-07 LAB — GLUCOSE, CAPILLARY
GLUCOSE-CAPILLARY: 262 mg/dL — AB (ref 65–99)
Glucose-Capillary: 243 mg/dL — ABNORMAL HIGH (ref 65–99)

## 2017-08-07 LAB — TROPONIN I

## 2017-08-07 LAB — PROTIME-INR
INR: 1.95
Prothrombin Time: 22.1 seconds — ABNORMAL HIGH (ref 11.4–15.2)

## 2017-08-07 LAB — INFLUENZA PANEL BY PCR (TYPE A & B)
INFLBPCR: NEGATIVE
Influenza A By PCR: NEGATIVE

## 2017-08-07 MED ORDER — WARFARIN SODIUM 2.5 MG PO TABS
2.5000 mg | ORAL_TABLET | Freq: Once | ORAL | Status: AC
  Administered 2017-08-07: 2.5 mg via ORAL
  Filled 2017-08-07: qty 1

## 2017-08-07 MED ORDER — HYDROCODONE-ACETAMINOPHEN 7.5-325 MG PO TABS
1.0000 | ORAL_TABLET | Freq: Three times a day (TID) | ORAL | Status: DC | PRN
  Administered 2017-08-07 – 2017-08-09 (×5): 1 via ORAL
  Filled 2017-08-07 (×5): qty 1

## 2017-08-07 MED ORDER — SODIUM CHLORIDE 0.9 % IV SOLN: INTRAVENOUS | Status: DC

## 2017-08-07 MED ORDER — METHYLPREDNISOLONE SODIUM SUCC 40 MG IJ SOLR
40.0000 mg | Freq: Two times a day (BID) | INTRAMUSCULAR | Status: DC
  Administered 2017-08-07 – 2017-08-08 (×2): 40 mg via INTRAVENOUS
  Filled 2017-08-07 (×2): qty 1

## 2017-08-07 MED ORDER — ACETAMINOPHEN 500 MG PO TABS
1000.0000 mg | ORAL_TABLET | Freq: Four times a day (QID) | ORAL | Status: DC | PRN
  Administered 2017-08-07 – 2017-08-08 (×3): 1000 mg via ORAL
  Filled 2017-08-07 (×3): qty 2

## 2017-08-07 MED ORDER — PANCRELIPASE (LIP-PROT-AMYL) 12000-38000 UNITS PO CPEP
36000.0000 [IU] | ORAL_CAPSULE | Freq: Three times a day (TID) | ORAL | Status: DC
  Administered 2017-08-07 – 2017-08-09 (×5): 36000 [IU] via ORAL
  Filled 2017-08-07 (×6): qty 3

## 2017-08-07 MED ORDER — ORAL CARE MOUTH RINSE
15.0000 mL | Freq: Two times a day (BID) | OROMUCOSAL | Status: DC
  Administered 2017-08-07 – 2017-08-09 (×4): 15 mL via OROMUCOSAL

## 2017-08-07 MED ORDER — DEXTROSE 5 % IV SOLN
1.0000 g | Freq: Once | INTRAVENOUS | Status: AC
  Administered 2017-08-07: 1 g via INTRAVENOUS
  Filled 2017-08-07: qty 10

## 2017-08-07 MED ORDER — INSULIN ASPART 100 UNIT/ML ~~LOC~~ SOLN
0.0000 [IU] | Freq: Three times a day (TID) | SUBCUTANEOUS | Status: DC
  Administered 2017-08-07: 3 [IU] via SUBCUTANEOUS
  Administered 2017-08-08 (×2): 2 [IU] via SUBCUTANEOUS
  Administered 2017-08-08 – 2017-08-09 (×2): 1 [IU] via SUBCUTANEOUS

## 2017-08-07 MED ORDER — SODIUM CHLORIDE 0.9 % IV BOLUS (SEPSIS)
500.0000 mL | Freq: Once | INTRAVENOUS | Status: AC
  Administered 2017-08-07: 500 mL via INTRAVENOUS

## 2017-08-07 MED ORDER — SIMVASTATIN 10 MG PO TABS
10.0000 mg | ORAL_TABLET | Freq: Every day | ORAL | Status: DC
  Administered 2017-08-07 – 2017-08-09 (×3): 10 mg via ORAL
  Filled 2017-08-07 (×3): qty 1

## 2017-08-07 MED ORDER — PANTOPRAZOLE SODIUM 40 MG PO TBEC
40.0000 mg | DELAYED_RELEASE_TABLET | Freq: Two times a day (BID) | ORAL | Status: DC
  Administered 2017-08-07 – 2017-08-09 (×4): 40 mg via ORAL
  Filled 2017-08-07 (×4): qty 1

## 2017-08-07 MED ORDER — DILTIAZEM HCL 60 MG PO TABS
120.0000 mg | ORAL_TABLET | Freq: Every day | ORAL | Status: DC
  Administered 2017-08-07 – 2017-08-09 (×3): 120 mg via ORAL
  Filled 2017-08-07 (×3): qty 2

## 2017-08-07 MED ORDER — DEXTROSE 5 % IV SOLN
500.0000 mg | INTRAVENOUS | Status: DC
  Administered 2017-08-07: 500 mg via INTRAVENOUS
  Filled 2017-08-07 (×3): qty 500

## 2017-08-07 MED ORDER — WARFARIN - PHARMACIST DOSING INPATIENT
Status: DC
  Administered 2017-08-08 – 2017-08-09 (×2)

## 2017-08-07 MED ORDER — FERROUS SULFATE 325 (65 FE) MG PO TABS
325.0000 mg | ORAL_TABLET | Freq: Every day | ORAL | Status: DC
  Administered 2017-08-08 – 2017-08-09 (×2): 325 mg via ORAL
  Filled 2017-08-07 (×2): qty 1

## 2017-08-07 MED ORDER — IPRATROPIUM-ALBUTEROL 0.5-2.5 (3) MG/3ML IN SOLN
3.0000 mL | Freq: Four times a day (QID) | RESPIRATORY_TRACT | Status: DC | PRN
  Administered 2017-08-07 – 2017-08-08 (×2): 3 mL via RESPIRATORY_TRACT
  Filled 2017-08-07 (×2): qty 3

## 2017-08-07 MED ORDER — DEXTROSE 5 % IV SOLN
1.0000 g | INTRAVENOUS | Status: DC
  Administered 2017-08-08 – 2017-08-09 (×2): 1 g via INTRAVENOUS
  Filled 2017-08-07 (×5): qty 10

## 2017-08-07 MED ORDER — FUROSEMIDE 10 MG/ML IJ SOLN
40.0000 mg | Freq: Once | INTRAMUSCULAR | Status: AC
  Administered 2017-08-07: 40 mg via INTRAVENOUS
  Filled 2017-08-07: qty 4

## 2017-08-07 MED ORDER — METHYLPREDNISOLONE SODIUM SUCC 125 MG IJ SOLR
60.0000 mg | Freq: Once | INTRAMUSCULAR | Status: AC
  Administered 2017-08-07: 60 mg via INTRAVENOUS
  Filled 2017-08-07: qty 2

## 2017-08-07 MED ORDER — POTASSIUM CHLORIDE CRYS ER 20 MEQ PO TBCR
40.0000 meq | EXTENDED_RELEASE_TABLET | Freq: Two times a day (BID) | ORAL | Status: DC
  Administered 2017-08-07 – 2017-08-09 (×4): 40 meq via ORAL
  Filled 2017-08-07: qty 2
  Filled 2017-08-07: qty 4
  Filled 2017-08-07 (×2): qty 2

## 2017-08-07 MED ORDER — IPRATROPIUM-ALBUTEROL 0.5-2.5 (3) MG/3ML IN SOLN
3.0000 mL | Freq: Once | RESPIRATORY_TRACT | Status: AC
  Administered 2017-08-07: 3 mL via RESPIRATORY_TRACT
  Filled 2017-08-07: qty 3

## 2017-08-07 MED ORDER — ALBUTEROL SULFATE (2.5 MG/3ML) 0.083% IN NEBU
2.5000 mg | INHALATION_SOLUTION | RESPIRATORY_TRACT | Status: DC | PRN
  Administered 2017-08-08: 2.5 mg via RESPIRATORY_TRACT
  Filled 2017-08-07: qty 3

## 2017-08-07 MED ORDER — AZITHROMYCIN 500 MG IV SOLR
500.0000 mg | INTRAVENOUS | Status: DC
  Administered 2017-08-08 – 2017-08-09 (×2): 500 mg via INTRAVENOUS
  Filled 2017-08-07 (×4): qty 500

## 2017-08-07 MED ORDER — CLONAZEPAM 0.5 MG PO TABS
0.5000 mg | ORAL_TABLET | Freq: Three times a day (TID) | ORAL | Status: DC | PRN
  Administered 2017-08-07: 0.5 mg via ORAL
  Filled 2017-08-07: qty 1

## 2017-08-07 MED ORDER — MOMETASONE FURO-FORMOTEROL FUM 200-5 MCG/ACT IN AERO
2.0000 | INHALATION_SPRAY | Freq: Two times a day (BID) | RESPIRATORY_TRACT | Status: DC
  Administered 2017-08-07 – 2017-08-09 (×4): 2 via RESPIRATORY_TRACT
  Filled 2017-08-07 (×2): qty 8.8

## 2017-08-07 NOTE — ED Provider Notes (Signed)
[facility] EMERGENCY DEPARTMENT Provider Note   CSN: [id number] Arrival date & time: 08/07/17  1202     History   Chief Complaint Chief Complaint  Patient presents with  . Chest Pain    HPI [name] is a 82 y.o. female.  Worsening dyspnea over the past 3 days, worse today.  Patient has a known history of COPD and is on 2 L of nasal oxygen at night.  Patient was given a nebulizer treatment in route via EMS.   Past medical history includes COPD, CAD, atrial fibrillation (tachybradycardia syndrome), pacemaker, many others.  She is able to ambulate, but becomes dyspneic quickly.  Review of systems positive for minimal anterior chest pain.  Severity of symptoms is moderate.  Nothing makes symptoms better or worse      Past Medical History:  Diagnosis Date  . Abnormal CT of the chest    Mediastinal and hilar adenopathy followed by Dr. Koleen Nimrod in [place]  . Anemia, unspecified   . Body mass index (BMI) of 20.0-20.9 in adult FEB 2013 147 LBS  . CAD (coronary artery disease) 2011   Catheterization August 2011: mild nonobstructive coronary artery disease complicated by large left rectus sheath hematoma status post evacuation Coumadin restarted without recurrence  . Carotid artery disease ([place])    Doppler, 05/2012, 0-39% bilateral  . Cataract   . Chronic airway obstruction, not elsewhere classified   . Chronic kidney disease, stage II (mild)   . Congestive heart failure, unspecified    EF now 60%, previous nonischemic cardiomyopathy  . Diabetes mellitus   . Dilated bile duct 10/19/2011  . Diverticulitis Dec 2012  . Gastritis    By EGD  . GERD (gastroesophageal reflux disease)   . Hypercholesterolemia   . Hypertension   . Mitral regurgitation 06/27/2013  . Neurogenic sleep apnea    Uses noctural oxygen prescribed by her pulmonologi  . Pacemaker    Single chamber [place]. Jude ACCENT [phone] SN 719 04/11/1957 10/31/2010  . Permanent atrial fibrillation (HCC)    H/o  tachybradycardia syndrome on Coumadin anticoagulation, has pacemaker.  . Recurrent UTI   . Rheumatic heart disease   . Tachycardia-bradycardia [facility])    s/p St. Jude single chamber PPM 10/2010   . Unspecified hypertensive kidney disease with chronic kidney disease stage I through stage IV, or unspecified(403.90)   . Valvular heart disease    a. H/o moderate mitral stenosis by cath 2011 but no mention of this on 10/2011 echo. b. 10/2011 echo: mod MR, mild AS, mild TR; EF>65%  . Vitamin D deficiency     Patient Active Problem List   Diagnosis Date Noted  . At high risk for falls 02/13/2016  . Chronic kidney disease (CKD), stage IV (severe) ([place]) 07/02/2015  . Hyperlipidemia with target LDL less than 100 12/31/2014  . Osteoporosis, post-menopausal 11/01/2013  . GERD (gastroesophageal reflux disease) 07/13/2013  . Diarrhea 07/13/2013  . Aortic stenosis 06/27/2013  . Acute on chronic diastolic CHF (congestive heart failure) ([place]) 06/27/2013  . Vitamin D deficiency   . Tobacco abuse 05/12/2012  . Abdominal wall hernia 10/19/2011  . CAD (coronary artery disease)   . Pacemaker-St.Jude   . Chronic anticoagulation 12/03/2010  . Anxiety 12/03/2010  . Mitral stenosis 05/30/2010  . Anemia 05/16/2010  . Type 2 diabetes mellitus with kidney complication, without long-term current use of insulin ([place]) 01/19/2009  . Essential hypertension 01/19/2009  . Permanent atrial fibrillation ([place]) 01/19/2009  . COPD GOLD O  01/19/2009  .  HEART MURMUR, BENIGN 01/19/2009    Past Surgical History:  Procedure Laterality Date  . CHOLECYSTECTOMY     40+ years ago  . COLONOSCOPY  10/2011   Dyann Ruddle sigmoid to descending diverticulosis, internal hemorrhoids  . ESOPHAGOGASTRODUODENOSCOPY  10/2011   Fields-erosive gastritis, biopsy with no H. pylori, hiatal hernia, peptic duodenitis, no celiac disease, duodenal diverticulum, duodenal nodule  . EUS  11/16/11   Mishra-13 MM CBD, normal pancreatic duct, otherwise  normal EUS  . Evacuation of retroperitoneal and left rectus sheath    . HEMATOMA EVACUATION     femoral  . HERNIA REPAIR     X3  . PILONIDAL CYST / SINUS EXCISION    . Single-chamber pacemaker implantation  3/12   by Dr Caryl Comes    OB History    Durenda Age Para Term Preterm AB Living   2 2 2          SAB TAB Ectopic Multiple Live Births                   Home Medications    Prior to Admission medications   Medication Sig Start Date End Date Taking? Authorizing Provider  acetaminophen (TYLENOL) 500 MG tablet Take 1,000 mg by mouth every 6 (six) hours as needed for mild pain, moderate pain or fever.     [provider]  Blood Glucose Calibration (TRUE METRIX LEVEL 1) LOW SOLN Use weekly 10/23/14   Hassell Done, Mary-Margaret, FNP  Blood Glucose Monitoring Suppl (TRUE METRIX AIR GLUCOSE METER) DEVI 1 Device by Does not apply route 2 (two) times daily. Use to Test blood sugar bid. DX E13.10 12/19/14   [name], FNP  calcium-vitamin D 250-100 MG-UNIT tablet Take 2 tablets by mouth daily. 12/05/15   Cherre Robins, PharmD  clonazePAM (KLONOPIN) 0.5 MG tablet TAKE ONE (1) TABLET THREE (3) TIMES EACH DAY 07/14/17   Hassell Done, Mary-Margaret, FNP  CREON 36000 units CPEP capsule TAKE 3 CAPSULES WITH MEALS  AND TAKE 1 CAPSULE WITH A SNACK AS DIRECTED 12/22/16   [name], FNP  denosumab (PROLIA) 60 MG/ML SOLN injection INJECT 60MG  (1ML) SUBCUTANEOUSLY EVERY SIX MONTHS 08/06/17   Evelina Dun A, FNP  diltiazem (CARDIZEM) 120 MG tablet TAKE 1 TABLET EVERY DAY 06/04/17   Herminio Commons, MD  ferrous sulfate 325 (65 FE) MG tablet Take 1 tablet (325 mg total) by mouth daily with breakfast. 10/29/15   Cherre Robins, PharmD  furosemide (LASIX) 40 MG tablet Take 1 tablet (40 mg total) by mouth as needed for edema (leg swelling). Patient not taking: Reported on 07/13/2017 05/25/17   Herminio Commons, MD  glucose blood (TRUE METRIX BLOOD GLUCOSE TEST) test strip TEST TWO TIMES DAILY  AS DIRECTED 07/20/17   Evelina Dun A, FNP  HYDROcodone-acetaminophen (NORCO) 7.5-325 MG tablet Take 1 tablet by mouth 3 (three) times daily as needed (pain).  02/17/16   [provider]  ipratropium-albuterol (DUONEB) 0.5-2.5 (3) MG/3ML SOLN INHALE THE CONTENTS OF 1 VIAL VIA NEBULIZER EVERY 4 HOURS AS NEEDED 07/05/17   [name], FNP  JANUVIA 50 MG tablet TAKE 1 TABLET EVERY DAY 06/04/17   Hassell Done, Mary-Margaret, FNP  nitroGLYCERIN (NITROSTAT) 0.4 MG SL tablet Place 1 tablet (0.4 mg total) under the tongue every 5 (five) minutes as needed for chest pain. 07/20/14   Herminio Commons, MD  OXYGEN Inhale 2 L into the lungs at bedtime. 2lpm with sleep only     [provider]  pantoprazole (PROTONIX) 40 MG tablet TAKE  1 TABLET TWICE DAILY 03/30/17   Hassell Done, Mary-Margaret, FNP  potassium chloride SA (K-DUR,KLOR-CON) 20 MEQ tablet Take 1 tablet (20 mEq total) by mouth every morning. 12/14/16   [name], FNP  Probiotic Product ([place]) CAPS Take 1 capsule by mouth daily.    [provider]  simvastatin (ZOCOR) 10 MG tablet TAKE 1 TABLET EVERY DAY 05/11/17   [name], FNP  SYMBICORT 160-4.5 MCG/ACT inhaler INHALE 2 PUFFS TWICE DAILY 05/11/17   [name], FNP  TRUEPLUS LANCETS 28G MISC TEST BLOOD SUGAR TWICE DAILY 03/03/17   [name], FNP  VENTOLIN HFA 108 [phone] Base) MCG/ACT inhaler USE 2 PUFFS EVERY 6 HOURS AS NEEDED 01/18/17   [name], FNP  warfarin (COUMADIN) 2 MG tablet TAKE PER PHYSICIAN INSTRUCTIONS 03/23/17   [name], FNP    Family History Family History  Problem Relation Age of Onset  . Cancer Mother        cervical  . Stroke Mother   . Cancer Daughter 43       kidney, lung  . Heart disease Father   . Heart attack Father   . Ulcers Father   . Early death Sister 1       died at 52 months old  . Hypertension Brother   . Cancer Paternal Aunt        breast  .  Hypertension Son   . Colon cancer Neg Hx     Social History Social History   Tobacco Use  . Smoking status: Current Every Day Smoker    Packs/day: 0.25    Years: 45.00    Pack years: 11.25    Types: Cigarettes    Start date: 02/05/1958  . Smokeless tobacco: Never Used  . Tobacco comment: smokes 5-6 cigarettes daily  Substance Use Topics  . Alcohol use: No    Alcohol/week: 0.0 oz  . Drug use: No     Allergies   Macrobid [nitrofurantoin monohyd macro]; Torsemide; and Tramadol   Review of Systems Review of Systems  All other systems reviewed and are negative.    Physical Exam Updated Vital Signs BP 134/78 (BP Location: Left Arm)   Pulse (!) 111   Temp (!) 100.4 F (38 C) (Oral)   Resp 20   Ht 5\' 2"  (1.575 m)   Wt 60.8 kg (134 lb)   SpO2 100%   BMI 24.51 kg/m   Physical Exam  Constitutional: She is oriented to person, place, and time.  Thin body habitus, slight tachypnea  HENT:  Head: Normocephalic and atraumatic.  Eyes: Conjunctivae are normal.  Neck: Neck supple.  Cardiovascular: Normal rate and regular rhythm.  Pulmonary/Chest: Effort normal.  Bilateral expiratory wheezes.  Abdominal: Soft. Bowel sounds are normal.  Musculoskeletal: Normal range of motion.  Neurological: She is alert and oriented to person, place, and time.  Skin: Skin is warm and dry.  Psychiatric: She has a normal mood and affect. Her behavior is normal.  Nursing note and vitals reviewed.    ED Treatments / Results  Labs (all labs ordered are listed, but only abnormal results are displayed) Labs Reviewed  CBC WITH DIFFERENTIAL/PLATELET - Abnormal; Notable for the following components:      Result Value   RBC 3.21 (*)    Hemoglobin 9.9 (*)    HCT 31.6 (*)    All other components within normal limits  BASIC METABOLIC PANEL - Abnormal; Notable for the following components:   Potassium 3.3 (*)  Glucose, Bld 129 (*)    Creatinine, Ser 1.22 (*)    GFR calc non Af Amer 41 (*)     GFR calc Af Amer 47 (*)    All other components within normal limits  TROPONIN I    EKG  EKG Interpretation  Date/Time:  Saturday August 07 2017 12:22:36 EST Ventricular Rate:  98 PR Interval:    QRS Duration: 123 QT Interval:  412 QTC Calculation: 527 R Axis:   -75 Text Interpretation:  Atrial flutter with predominant 3:1 AV block Nonspecific IVCD with LAD LVH with secondary repolarization abnormality Anterior Q waves, possibly due to LVH Confirmed by Nat Christen [phone]) on 08/07/2017 12:34:07 PM       Radiology Dg Chest Port 1 View  Result Date: 08/07/2017 CLINICAL DATA:  Chest pain EXAM: PORTABLE CHEST 1 VIEW COMPARISON:  July 29, 2016 FINDINGS: The heart size and mediastinal contours are stable. Cardiac pacemaker is identified unchanged. The heart size is enlarged. There is pulmonary edema. Patchy consolidation of bilateral lung bases are identified. There is a small left pleural effusion. The visualized skeletal structures are stable. IMPRESSION: Mild pulmonary edema. Patchy consolidation of bilateral lung bases which can be due to atelectasis or early pneumonia. Small left pleural effusion is identified. Electronically Signed   By: Abelardo Diesel M.D.   On: 08/07/2017 12:42    Procedures Procedures (including critical care time)  Medications Ordered in ED Medications  azithromycin (ZITHROMAX) 500 mg in dextrose 5 % 250 mL IVPB (not administered)  ipratropium-albuterol (DUONEB) 0.5-2.5 (3) MG/3ML nebulizer solution 3 mL (3 mLs Nebulization Given 08/07/17 1315)  methylPREDNISolone sodium succinate (SOLU-MEDROL) 125 mg/2 mL injection 60 mg (60 mg Intravenous Given 08/07/17 1310)  cefTRIAXone (ROCEPHIN) 1 g in dextrose 5 % 50 mL IVPB (1 g Intravenous New Bag/Given 08/07/17 1311)     Initial Impression / Assessment and Plan / ED Course  I have reviewed the triage vital signs and the nursing notes.  Pertinent labs & imaging results that were available during my care  of the patient were reviewed by me and considered in my medical decision making (see chart for details).     Patient presents with worsening dyspnea over the past 3 days.  She has known heart disease and COPD.  Chest x-ray reveals patchy consolidations in the bilateral lung bases.  Will Rx IV steroids, nebulizer treatment, IV Rocephin, IV Zithromax.  Admit to general medicine.  Final Clinical Impressions(s) / ED Diagnoses   Final diagnoses:  Community acquired pneumonia, unspecified laterality  Chronic obstructive pulmonary disease, unspecified COPD type ([place])  Anemia, unspecified type    ED Discharge Orders    None       Nat Christen, MD 08/07/17 1420

## 2017-08-07 NOTE — ED Triage Notes (Addendum)
Patient brought in by [facility] from home. Patient complains of chest pain and shortness of breath x3 days but worse today. EMS gave albuterol neb given by [facility] and vitals improved after breathing was controlled. Patient 87% on RA. Patient is suppose to wear oxygen only while sleeping. Patient denies chest pain at this time.   Heart rate 146 on arrival per EMS, improved after breathing controlled.

## 2017-08-07 NOTE — Progress Notes (Signed)
Pharmacy:  Coumadin dosing  Awaiting result of INR (labs ordered) for Coumadin dosing.   Hart Robinsons, PharmD Clinical Pharmacist Pager:  [phone] 08/07/2017

## 2017-08-07 NOTE — H&P (Signed)
History and Physical    [name] GYF:[id number] DOB: 1935/03/30 DOA: 08/07/2017  PCP: [name], FNP (Confirm with patient/family/NH records and if not entered, this has to be entered at [facility] point of entry) Patient coming from: home.   I have personally briefly reviewed patient's old medical records in [place] [address]  Chief Complaint: fever, chills and sob.   HPI: [name] is a 82 y.o. female with medical history significant of CAD.  Stage 2 ckd, noted artery disease, hypertension, hyperlipidemia, GERD, type 2 diabetes mellitus, COPD on 2 L of nasal cannula oxygen at night comes in for fever chills shortness of breath since 3 days, associated with nonproductive cough.  At baseline she is on 2 L of nasal cannula oxygen to be used only at night.  But over the last 3 days she continues to worsen despite oxygen.  No chest pain dizziness syncope, nausea vomiting abdominal pain or diarrhea, headache sensory deficits.on arrival to ED, he was febrile with mild leukocytosis and chest x-ray shows basilar opacities suspicious for pneumonia or interstitial fluid.  He was referred to medical service for evaluation and management of community-acquired pneumonia versus acute on chronic mild diastolic heart failure.  Review of Systems: As per HPI otherwise 10 point review of systems negative.    Past Medical History:  Diagnosis Date  . Abnormal CT of the chest    Mediastinal and hilar adenopathy followed by Dr. Koleen Nimrod in [place]  . Anemia, unspecified   . Body mass index (BMI) of 20.0-20.9 in adult FEB 2013 147 LBS  . CAD (coronary artery disease) 2011   Catheterization August 2011: mild nonobstructive coronary artery disease complicated by large left rectus sheath hematoma status post evacuation Coumadin restarted without recurrence  . Carotid artery disease ([place])    Doppler, 05/2012, 0-39% bilateral  . Cataract   . Chronic airway obstruction, not elsewhere classified     . Chronic kidney disease, stage II (mild)   . Congestive heart failure, unspecified    EF now 60%, previous nonischemic cardiomyopathy  . Diabetes mellitus   . Dilated bile duct 10/19/2011  . Diverticulitis Dec 2012  . Gastritis    By EGD  . GERD (gastroesophageal reflux disease)   . Hypercholesterolemia   . Hypertension   . Mitral regurgitation 06/27/2013  . Neurogenic sleep apnea    Uses noctural oxygen prescribed by her pulmonologi  . Pacemaker    Single chamber [place]. Jude ACCENT [id number] SN 719 04/11/1959 10/31/2010  . Permanent atrial fibrillation (HCC)    H/o tachybradycardia syndrome on Coumadin anticoagulation, has pacemaker.  . Recurrent UTI   . Rheumatic heart disease   . Tachycardia-bradycardia [facility])    s/p St. Jude single chamber PPM 10/2010   . Unspecified hypertensive kidney disease with chronic kidney disease stage I through stage IV, or unspecified(403.90)   . Valvular heart disease    a. H/o moderate mitral stenosis by cath 2011 but no mention of this on 10/2011 echo. b. 10/2011 echo: mod MR, mild AS, mild TR; EF>65%  . Vitamin D deficiency     Past Surgical History:  Procedure Laterality Date  . CHOLECYSTECTOMY     40+ years ago  . COLONOSCOPY  10/2011   Dyann Ruddle sigmoid to descending diverticulosis, internal hemorrhoids  . ESOPHAGOGASTRODUODENOSCOPY  10/2011   Fields-erosive gastritis, biopsy with no H. pylori, hiatal hernia, peptic duodenitis, no celiac disease, duodenal diverticulum, duodenal nodule  . EUS  11/16/11   Mishra-13 MM CBD, normal pancreatic duct, otherwise  normal EUS  . Evacuation of retroperitoneal and left rectus sheath    . HEMATOMA EVACUATION     femoral  . HERNIA REPAIR     X3  . PILONIDAL CYST / SINUS EXCISION    . Single-chamber pacemaker implantation  3/12   by Dr Caryl Comes     reports that she has been smoking cigarettes.  She started smoking about 59 years ago. She has a 11.25 pack-year smoking history. she has never used smokeless  tobacco. She reports that she does not drink alcohol or use drugs.  Allergies  Allergen Reactions  . Macrobid [Nitrofurantoin Monohyd Macro] Rash  . Torsemide Nausea And Vomiting  . Tramadol Nausea Only    Family History  Problem Relation Age of Onset  . Cancer Mother        cervical  . Stroke Mother   . Cancer Daughter 28       kidney, lung  . Heart disease Father   . Heart attack Father   . Ulcers Father   . Early death Sister 1       died at 63 months old  . Hypertension Brother   . Cancer Paternal Aunt        breast  . Hypertension Son   . Colon cancer Neg Hx    Reviewed not pertinent  Prior to Admission medications   Medication Sig Start Date End Date Taking? Authorizing Provider  acetaminophen (TYLENOL) 500 MG tablet Take 1,000 mg by mouth every 6 (six) hours as needed for mild pain, moderate pain or fever.    Yes [provider]  Blood Glucose Calibration (TRUE METRIX LEVEL 1) LOW SOLN Use weekly 10/23/14  Yes Martin, Mary-Margaret, FNP  Blood Glucose Monitoring Suppl (TRUE METRIX AIR GLUCOSE METER) DEVI 1 Device by Does not apply route 2 (two) times daily. Use to Test blood sugar bid. DX E13.10 12/19/14  Yes Hassell Done, Mary-Margaret, FNP  calcium-vitamin D 250-100 MG-UNIT tablet Take 2 tablets by mouth daily. 12/05/15  Yes Cherre Robins, PharmD  clonazePAM (KLONOPIN) 0.5 MG tablet TAKE ONE (1) TABLET THREE (3) TIMES EACH DAY 07/14/17  Yes Hassell Done, Mary-Margaret, FNP  CREON 36000 units CPEP capsule TAKE 3 CAPSULES WITH MEALS  AND TAKE 1 CAPSULE WITH A SNACK AS DIRECTED 12/22/16  Yes Hassell Done, Mary-Margaret, FNP  denosumab (PROLIA) 60 MG/ML SOLN injection INJECT 60MG  (1ML) SUBCUTANEOUSLY EVERY SIX MONTHS 08/06/17  Yes Evelina Dun A, FNP  diltiazem (CARDIZEM) 120 MG tablet TAKE 1 TABLET EVERY DAY 06/04/17  Yes Herminio Commons, MD  ferrous sulfate 325 (65 FE) MG tablet Take 1 tablet (325 mg total) by mouth daily with breakfast. 10/29/15  Yes Cherre Robins, PharmD    furosemide (LASIX) 40 MG tablet Take 1 tablet (40 mg total) by mouth as needed for edema (leg swelling). 05/25/17  Yes Herminio Commons, MD  glucose blood (TRUE METRIX BLOOD GLUCOSE TEST) test strip TEST TWO TIMES DAILY AS DIRECTED 07/20/17  Yes Hawks, Christy A, FNP  HYDROcodone-acetaminophen (NORCO) 7.5-325 MG tablet Take 1 tablet by mouth 3 (three) times daily as needed (pain).  02/17/16  Yes [provider]  ipratropium-albuterol (DUONEB) 0.5-2.5 (3) MG/3ML SOLN INHALE THE CONTENTS OF 1 VIAL VIA NEBULIZER EVERY 4 HOURS AS NEEDED 07/05/17  Yes Hassell Done, Mary-Margaret, FNP  JANUVIA 50 MG tablet TAKE 1 TABLET EVERY DAY 06/04/17  Yes Hassell Done, Mary-Margaret, FNP  nitroGLYCERIN (NITROSTAT) 0.4 MG SL tablet Place 1 tablet (0.4 mg total) under the tongue every 5 (five) minutes as  needed for chest pain. 07/20/14  Yes Herminio Commons, MD  OXYGEN Inhale 2 L into the lungs at bedtime. 2lpm with sleep only    Yes [provider]  pantoprazole (PROTONIX) 40 MG tablet TAKE 1 TABLET TWICE DAILY 03/30/17  Yes Hassell Done, Mary-Margaret, FNP  potassium chloride SA (K-DUR,KLOR-CON) 20 MEQ tablet Take 1 tablet (20 mEq total) by mouth every morning. 12/14/16  Yes Hassell Done, Mary-Margaret, FNP  Probiotic Product ([place]) CAPS Take 1 capsule by mouth daily.   Yes [provider]  simvastatin (ZOCOR) 10 MG tablet TAKE 1 TABLET EVERY DAY 05/11/17  Yes [name], FNP  SYMBICORT 160-4.5 MCG/ACT inhaler INHALE 2 PUFFS TWICE DAILY 05/11/17  Yes Hassell Done, Mary-Margaret, FNP  TRUEPLUS LANCETS 28G MISC TEST BLOOD SUGAR TWICE DAILY 03/03/17  Yes Hassell Done, Mary-Margaret, FNP  VENTOLIN HFA 108 (90 Base) MCG/ACT inhaler USE 2 PUFFS EVERY 6 HOURS AS NEEDED 01/18/17  Yes Hassell Done, Mary-Margaret, FNP  warfarin (COUMADIN) 2 MG tablet TAKE PER PHYSICIAN INSTRUCTIONS 03/23/17  Yes [name], FNP    Physical Exam: Vitals:   08/07/17 1412 08/07/17 1430 08/07/17 1500 08/07/17 1549  BP:  (!) 161/74 135/80 (!) 147/90 136/72  Pulse: 91 66 (!) 108 (!) 104  Resp: 20 (!) 21 (!) 22 18  Temp:    98.6 F (37 C)  TempSrc:      SpO2: 97% 98% 98% 100%  Weight:    60.8 kg (134 lb)  Height:    5\' 2"  (1.575 m)    Constitutional: NAD, calm, comfortable Vitals:   08/07/17 1412 08/07/17 1430 08/07/17 1500 08/07/17 1549  BP: (!) 161/74 135/80 (!) 147/90 136/72  Pulse: 91 66 (!) 108 (!) 104  Resp: 20 (!) 21 (!) 22 18  Temp:    98.6 F (37 C)  TempSrc:      SpO2: 97% 98% 98% 100%  Weight:    60.8 kg (134 lb)  Height:    5\' 2"  (1.575 m)   Eyes: PERRL, lids and conjunctivae normal ENMT: Mucous membranes are moist. Posterior pharynx clear of any exudate or lesions.Normal dentition.  Neck: normal, supple, no masses, no thyromegaly Respiratory: Lateral expiratory wheezing both anterior and posterior.  Scattered Rales no rhonchi air entry fair Cardiovascular: Regular rate and rhythm, no murmurs / rubs / gallops. No extremity edema. 2+ pedal pulses. No carotid bruits.  Abdomen: no tenderness, no masses palpated. No hepatosplenomegaly. Bowel sounds positive.  Musculoskeletal: no clubbing / cyanosis. No joint deformity upper and lower extremities. Good ROM, no contractures. Normal muscle tone.  Skin: no rashes, lesions, ulcers. No induration Neurologic: CN 2-12 grossly intact. Sensation intact, DTR normal. Strength 5/5 in all 4.  Psychiatric: Normal judgment and insight. Alert and oriented x 3. Normal mood.     Labs on Admission: I have personally reviewed following labs and imaging studies  CBC: Recent Labs  Lab 08/07/17 1220  WBC 9.6  NEUTROABS 7.7  HGB 9.9*  HCT 31.6*  MCV 98.4  PLT 831   Basic Metabolic Panel: Recent Labs  Lab 08/07/17 1220  NA 140  K 3.3*  CL 104  CO2 24  GLUCOSE 129*  BUN 17  CREATININE 1.22*  CALCIUM 9.8   GFR: Estimated Creatinine Clearance: 31.6 mL/min (A) (by C-G formula based on SCr of 1.22 mg/dL (H)). Liver Function Tests: No results  for input(s): AST, ALT, ALKPHOS, BILITOT, PROT, ALBUMIN in the last 168 hours. No results for input(s): LIPASE, AMYLASE in the last 168 hours. No  results for input(s): AMMONIA in the last 168 hours. Coagulation Profile: No results for input(s): INR, PROTIME in the last 168 hours. Cardiac Enzymes: Recent Labs  Lab 08/07/17 1220  TROPONINI <0.03   BNP (last 3 results) No results for input(s): PROBNP in the last 8760 hours. HbA1C: No results for input(s): HGBA1C in the last 72 hours. CBG: Recent Labs  Lab 08/07/17 1644  GLUCAP 243*   Lipid Profile: No results for input(s): CHOL, HDL, LDLCALC, TRIG, CHOLHDL, LDLDIRECT in the last 72 hours. Thyroid Function Tests: No results for input(s): TSH, T4TOTAL, FREET4, T3FREE, THYROIDAB in the last 72 hours. Anemia Panel: No results for input(s): VITAMINB12, FOLATE, FERRITIN, TIBC, IRON, RETICCTPCT in the last 72 hours. Urine analysis:    Component Value Date/Time   COLORURINE YELLOW 11/25/2015 2255   APPEARANCEUR Clear 09/14/2016 1418   LABSPEC 1.010 11/25/2015 2255   PHURINE 7.0 11/25/2015 2255   GLUCOSEU Negative 09/14/2016 1418   HGBUR NEGATIVE 11/25/2015 2255   BILIRUBINUR Negative 09/14/2016 1418   KETONESUR NEGATIVE 11/25/2015 2255   PROTEINUR Negative 09/14/2016 1418   PROTEINUR NEGATIVE 11/25/2015 2255   UROBILINOGEN negative 02/28/2015 1602   UROBILINOGEN 0.2 08/21/2013 1922   NITRITE Negative 09/14/2016 1418   NITRITE NEGATIVE 11/25/2015 2255   LEUKOCYTESUR 1+ (A) 09/14/2016 1418    Radiological Exams on Admission: Dg Chest Port 1 View  Result Date: 08/07/2017 CLINICAL DATA:  Chest pain EXAM: PORTABLE CHEST 1 VIEW COMPARISON:  July 29, 2016 FINDINGS: The heart size and mediastinal contours are stable. Cardiac pacemaker is identified unchanged. The heart size is enlarged. There is pulmonary edema. Patchy consolidation of bilateral lung bases are identified. There is a small left pleural effusion. The visualized  skeletal structures are stable. IMPRESSION: Mild pulmonary edema. Patchy consolidation of bilateral lung bases which can be due to atelectasis or early pneumonia. Small left pleural effusion is identified. Electronically Signed   By: Abelardo Diesel M.D.   On: 08/07/2017 12:42    EKG: Independently reviewed. Atrial flutter   Assessment/Plan Active Problems:   CAP (community acquired pneumonia) Acute respiratory failure with hypoxia probably secondary to community-acquired pneumonia, mild acute on chronic diastolic heart failure, with a small component of COPD exacerbation.  Admit to telemetry, 1 dose of IV Lasix 40 mg given.  Daily weights, strict intake and output.  Watch renal parameters on Lasix. Start IV Rocephin, Zithromax and nasal cannula oxygen to keep sats greater than 90%.  Blood cultures sputum cultures, urine for streptococcal antigen and Legionella antigen ordered. Plans a PCR to evaluate for flu. Solu-Medrol, duo nebs, oxygen as needed.   Hypokalemia replaced repeat electrolytes in the morning.   Stage II CKD Creatinine is actually better than baseline, continue to monitor.   Anemia Normocytic Probably anemia of chronic disease Baseline hemoglobin around 10.  Today's hemoglobin is 9.9.  Continue to monitor  History of coronary artery disease Denies any chest pain at this time.    Atrial fibrillation/flutter Chronic, rate controlled on Coumadin for anticoagulation. INR is pending   Type 2 diabetes mellitus with renal complications Continue with sliding scale insulin for now.   For lipidemia resume Zocor    DVT prophylaxis: Coumadin Code Status: Full code Family Communication: No family at bedside, discussed the plan with the patient  disposition Plan: Pending PT evaluation probably home in the next couple of days Consults called: None Admission status: Inpatient telemetry   Hosie Poisson MD [facility] Pager [phone]  If 7PM-7AM, please  contact night-coverage  [email or web address] Password [facility]  08/07/2017, 5:23 PM

## 2017-08-07 NOTE — Progress Notes (Signed)
ANTICOAGULATION CONSULT NOTE - Initial Consult  Pharmacy Consult for Coumadin (home med) Indication: atrial fibrillation  Allergies  Allergen Reactions  . Macrobid [Nitrofurantoin Monohyd Macro] Rash  . Torsemide Nausea And Vomiting  . Tramadol Nausea Only    Patient Measurements: Height: 5\' 2"  (157.5 cm) Weight: 134 lb (60.8 kg) IBW/kg (Calculated) : 50.1  Vital Signs: Temp: 98.6 F (37 C) (12/29 1549) Temp Source: Oral (12/29 1207) BP: 136/72 (12/29 1549) Pulse Rate: 104 (12/29 1549)  Labs: Recent Labs    08/07/17 1220 08/07/17 1828  HGB 9.9*  --   HCT 31.6*  --   PLT 214  --   LABPROT  --  22.1*  INR  --  1.95  CREATININE 1.22*  --   TROPONINI <0.03  --     Estimated Creatinine Clearance: 31.6 mL/min (A) (by C-G formula based on SCr of 1.22 mg/dL (H)).   Medical History: Past Medical History:  Diagnosis Date  . Abnormal CT of the chest    Mediastinal and hilar adenopathy followed by Dr. Koleen Nimrod in [place]  . Anemia, unspecified   . Body mass index (BMI) of 20.0-20.9 in adult FEB 2013 147 LBS  . CAD (coronary artery disease) 2011   Catheterization August 2011: mild nonobstructive coronary artery disease complicated by large left rectus sheath hematoma status post evacuation Coumadin restarted without recurrence  . Carotid artery disease ([place])    Doppler, 05/2012, 0-39% bilateral  . Cataract   . Chronic airway obstruction, not elsewhere classified   . Chronic kidney disease, stage II (mild)   . Congestive heart failure, unspecified    EF now 60%, previous nonischemic cardiomyopathy  . Diabetes mellitus   . Dilated bile duct 10/19/2011  . Diverticulitis Dec 2012  . Gastritis    By EGD  . GERD (gastroesophageal reflux disease)   . Hypercholesterolemia   . Hypertension   . Mitral regurgitation 06/27/2013  . Neurogenic sleep apnea    Uses noctural oxygen prescribed by her pulmonologi  . Pacemaker    Single chamber [place]. Jude ACCENT [phone] SN 719  04/11/1959 10/31/2010  . Permanent atrial fibrillation (HCC)    H/o tachybradycardia syndrome on Coumadin anticoagulation, has pacemaker.  . Recurrent UTI   . Rheumatic heart disease   . Tachycardia-bradycardia [facility])    s/p St. Jude single chamber PPM 10/2010   . Unspecified hypertensive kidney disease with chronic kidney disease stage I through stage IV, or unspecified(403.90)   . Valvular heart disease    a. H/o moderate mitral stenosis by cath 2011 but no mention of this on 10/2011 echo. b. 10/2011 echo: mod MR, mild AS, mild TR; EF>65%  . Vitamin D deficiency     Medications:  Medications Prior to Admission  Medication Sig Dispense Refill Last Dose  . acetaminophen (TYLENOL) 500 MG tablet Take 1,000 mg by mouth every 6 (six) hours as needed for mild pain, moderate pain or fever.    unknown  . Blood Glucose Calibration (TRUE METRIX LEVEL 1) LOW SOLN Use weekly 3 each 1 08/06/2017 at Unknown time  . Blood Glucose Monitoring Suppl (TRUE METRIX AIR GLUCOSE METER) DEVI 1 Device by Does not apply route 2 (two) times daily. Use to Test blood sugar bid. DX E13.10 1 Device 0 08/06/2017 at Unknown time  . calcium-vitamin D 250-100 MG-UNIT tablet Take 2 tablets by mouth daily.   08/07/2017 at Unknown time  . clonazePAM (KLONOPIN) 0.5 MG tablet TAKE ONE (1) TABLET THREE (3) TIMES EACH DAY 90  tablet 1 08/07/2017 at Unknown time  . CREON 36000 units CPEP capsule TAKE 3 CAPSULES WITH MEALS  AND TAKE 1 CAPSULE WITH A SNACK AS DIRECTED 900 capsule 2 08/07/2017 at Unknown time  . denosumab (PROLIA) 60 MG/ML SOLN injection INJECT 60MG  (1ML) SUBCUTANEOUSLY EVERY SIX MONTHS 1 each 0 past 6 months  . diltiazem (CARDIZEM) 120 MG tablet TAKE 1 TABLET EVERY DAY 90 tablet 1 08/06/2017 at Unknown time  . ferrous sulfate 325 (65 FE) MG tablet Take 1 tablet (325 mg total) by mouth daily with breakfast.  3 08/06/2017 at Unknown time  . furosemide (LASIX) 40 MG tablet Take 1 tablet (40 mg total) by mouth as needed for  edema (leg swelling).   unknown  . glucose blood (TRUE METRIX BLOOD GLUCOSE TEST) test strip TEST TWO TIMES DAILY AS DIRECTED 100 each 5 08/06/2017 at Unknown time  . HYDROcodone-acetaminophen (NORCO) 7.5-325 MG tablet Take 1 tablet by mouth 3 (three) times daily as needed (pain).    08/07/2017 at Unknown time  . ipratropium-albuterol (DUONEB) 0.5-2.5 (3) MG/3ML SOLN INHALE THE CONTENTS OF 1 VIAL VIA NEBULIZER EVERY 4 HOURS AS NEEDED 1080 mL 1 08/06/2017 at Unknown time  . JANUVIA 50 MG tablet TAKE 1 TABLET EVERY DAY 90 tablet 0 08/07/2017 at Unknown time  . nitroGLYCERIN (NITROSTAT) 0.4 MG SL tablet Place 1 tablet (0.4 mg total) under the tongue every 5 (five) minutes as needed for chest pain. 25 tablet 3 unknown  . OXYGEN Inhale 2 L into the lungs at bedtime. 2lpm with sleep only    Taking  . pantoprazole (PROTONIX) 40 MG tablet TAKE 1 TABLET TWICE DAILY 180 tablet 1 08/07/2017 at Unknown time  . potassium chloride SA (K-DUR,KLOR-CON) 20 MEQ tablet Take 1 tablet (20 mEq total) by mouth every morning. 90 tablet 1 08/07/2017 at Unknown time  . Probiotic Product ([place]) CAPS Take 1 capsule by mouth daily.   unknown  . simvastatin (ZOCOR) 10 MG tablet TAKE 1 TABLET EVERY DAY 90 tablet 1 08/07/2017 at Unknown time  . SYMBICORT 160-4.5 MCG/ACT inhaler INHALE 2 PUFFS TWICE DAILY 3 Inhaler 1 unknown  . TRUEPLUS LANCETS 28G MISC TEST BLOOD SUGAR TWICE DAILY 200 each 1 08/07/2017 at Unknown time  . VENTOLIN HFA 108 (90 Base) MCG/ACT inhaler USE 2 PUFFS EVERY 6 HOURS AS NEEDED 18 g 4 unknown  . warfarin (COUMADIN) 2 MG tablet TAKE PER PHYSICIAN INSTRUCTIONS 135 tablet 1 08/07/2017 at Unknown time    Assessment: 82yo female on chronic Coumadin PTA.  INR below goal on admission.   Goal of Therapy:  INR 2-3 Monitor platelets by anticoagulation protocol: Yes   Plan:  Coumadin 2.5mg  x 1  INR daily  [place] Crane, Tyann Niehaus A 08/07/2017,7:39 PM

## 2017-08-08 DIAGNOSIS — I482 Chronic atrial fibrillation: Secondary | ICD-10-CM

## 2017-08-08 DIAGNOSIS — K219 Gastro-esophageal reflux disease without esophagitis: Secondary | ICD-10-CM

## 2017-08-08 DIAGNOSIS — Z7901 Long term (current) use of anticoagulants: Secondary | ICD-10-CM

## 2017-08-08 LAB — PROTIME-INR
INR: 2.46
Prothrombin Time: 26.5 seconds — ABNORMAL HIGH (ref 11.4–15.2)

## 2017-08-08 LAB — HEMOGLOBIN A1C
Hgb A1c MFr Bld: 5.1 % (ref 4.8–5.6)
Mean Plasma Glucose: 99.67 mg/dL

## 2017-08-08 LAB — BASIC METABOLIC PANEL
ANION GAP: 12 (ref 5–15)
BUN: 25 mg/dL — ABNORMAL HIGH (ref 6–20)
CHLORIDE: 102 mmol/L (ref 101–111)
CO2: 23 mmol/L (ref 22–32)
Calcium: 9.7 mg/dL (ref 8.9–10.3)
Creatinine, Ser: 1.35 mg/dL — ABNORMAL HIGH (ref 0.44–1.00)
GFR calc Af Amer: 42 mL/min — ABNORMAL LOW (ref >60)
GFR, EST NON AFRICAN AMERICAN: 36 mL/min — AB (ref >60)
GLUCOSE: 185 mg/dL — AB (ref 65–99)
POTASSIUM: 4.4 mmol/L (ref 3.5–5.1)
Sodium: 137 mmol/L (ref 135–145)

## 2017-08-08 LAB — CBC
HEMATOCRIT: 29.6 % — AB (ref 36.0–46.0)
HEMOGLOBIN: 9.4 g/dL — AB (ref 12.0–15.0)
MCH: 30.9 pg (ref 26.0–34.0)
MCHC: 31.8 g/dL (ref 30.0–36.0)
MCV: 97.4 fL (ref 78.0–100.0)
Platelets: 194 10*3/uL (ref 150–400)
RBC: 3.04 MIL/uL — AB (ref 3.87–5.11)
RDW: 13.5 % (ref 11.5–15.5)
WBC: 5.4 10*3/uL (ref 4.0–10.5)

## 2017-08-08 LAB — GLUCOSE, CAPILLARY
GLUCOSE-CAPILLARY: 161 mg/dL — AB (ref 65–99)
GLUCOSE-CAPILLARY: 142 mg/dL — AB (ref 65–99)
Glucose-Capillary: 192 mg/dL — ABNORMAL HIGH (ref 65–99)
Glucose-Capillary: 141 mg/dL — ABNORMAL HIGH (ref 65–99)

## 2017-08-08 LAB — STREP PNEUMONIAE URINARY ANTIGEN: Strep Pneumo Urinary Antigen: NEGATIVE

## 2017-08-08 MED ORDER — WARFARIN SODIUM 2 MG PO TABS
2.0000 mg | ORAL_TABLET | Freq: Once | ORAL | Status: AC
  Administered 2017-08-08: 2 mg via ORAL
  Filled 2017-08-08: qty 1

## 2017-08-08 MED ORDER — PREDNISONE 20 MG PO TABS
40.0000 mg | ORAL_TABLET | Freq: Every day | ORAL | Status: DC
  Administered 2017-08-09: 40 mg via ORAL
  Filled 2017-08-08: qty 2

## 2017-08-08 MED ORDER — FUROSEMIDE 10 MG/ML IJ SOLN
40.0000 mg | Freq: Once | INTRAMUSCULAR | Status: AC
  Administered 2017-08-08: 40 mg via INTRAVENOUS
  Filled 2017-08-08: qty 4

## 2017-08-08 NOTE — Progress Notes (Signed)
PROGRESS NOTE    [name]  WUJ:[id number] DOB: January 15, 1935 DOA: 08/07/2017 PCP: [name], FNP    Brief Narrative:   Shanekia Latella is a 82 y.o. female with medical history significant of CAD.  Stage 2 ckd, noted artery disease, hypertension, hyperlipidemia, GERD, type 2 diabetes mellitus, COPD on 2 L of nasal cannula oxygen at night comes in for fever chills shortness of breath since 3 days, associated with nonproductive cough.  At baseline she is on 2 L of nasal cannula oxygen to be used only at night.  But over the last 3 days she continues to worsen despite oxygen on arrival to ED, he was febrile with mild leukocytosis and chest x-ray shows basilar opacities suspicious for pneumonia or interstitial fluid.  He was referred to medical service for evaluation and management of community-acquired pneumonia versus acute on chronic mild diastolic heart failure      Assessment & Plan:   Active Problems:   Type 2 diabetes mellitus with kidney complication, without long-term current use of insulin (HCC)   Essential hypertension   Permanent atrial fibrillation (HCC)   COPD GOLD O    Chronic anticoagulation   CAD (coronary artery disease)   Tobacco abuse   Acute on chronic diastolic CHF (congestive heart failure) (HCC)   GERD (gastroesophageal reflux disease)   Hyperlipidemia with target LDL less than 100   CAP (community acquired pneumonia)   Acute respiratory failure with hypoxia probably secondary to community-acquired pneumonia/mild acute on chronic diastolic heart failure. She was admitted to telemetry he was given a dose of IV Lasix 40 mg.  Diuresed about 1.8 L after the IV Lasix.  We will continue with IV Lasix 40 mg today.  Watch renal parameters and electrolytes while on Lasix.  Continue with strict intake and output and daily weights.   Started on IV Rocephin and Zithromax for community-acquired pneumonia.  Blood cultures are pending and negative so far.   Urine for streptococcal antigen active.  Influenza PCR negative   Mild COPD exacerbation Resume duo nebs and Dulera.  Start tapering steroids.    Hypertension well-controlled resume home medications.  Chronic atrial fibrillation Rate controlled with Cardizem.  On Coumadin for anticoagulation.  INR is therapeutic.  Resume Coumadin as per pharmacy.    Hyperlipidemia resume statin.   GERD resume Protonix.   Stage II CKD Creatinine at baseline.  Anemia of chronic disease Normocytic Hemoglobin stable around 9.   DVT prophylaxis: coumadin.   Code Status: full code.  Family Communication: None at bedside today discussed the plan with the patient Disposition Plan: pEnding PT evaluation  Consultants:   None  Procedures: None  Antimicrobials: Rocephin and Zithromax since admission   Subjective: Slightly better than yesterday, but still very weak .   Objective: Vitals:   08/07/17 2324 08/08/17 0436 08/08/17 0820 08/08/17 1300  BP: (!) 132/54 (!) 131/55  (!) 122/50  Pulse: 61 (!) 58  (!) 59  Resp: 20 20  18   Temp: 97.8 F (36.6 C) 97.9 F (36.6 C)  98.4 F (36.9 C)  TempSrc: Oral Oral  Oral  SpO2: 100% 97% 97% 100%  Weight:      Height:        Intake/Output Summary (Last 24 hours) at 08/08/2017 1412 Last data filed at 08/08/2017 0803 Gross per 24 hour  Intake 845 ml  Output 1750 ml  Net -905 ml   Filed Weights   08/07/17 1208 08/07/17 1549  Weight: 60.8 kg (134 lb) 60.8 kg (  134 lb)    Examination:  General exam: Appears calm on 2 lit of [place] OXYGEN.  Respiratory system: air entry fair, no wheezing or rhonchi.  Cardiovascular system: S1 & S2 heard, RRR. No JVD, . No pedal edema. Gastrointestinal system: Abdomen is nondistended, soft and nontender. No organomegaly or masses felt. Normal bowel sounds heard. Central nervous system: Alert and oriented. No focal neurological deficits. Extremities: Symmetric 5 x 5 power. Skin: No rashes, lesions or  ulcers Psychiatry: Judgement and insight appear normal. Mood & affect appropriate.     Data Reviewed: I have personally reviewed following labs and imaging studies  CBC: Recent Labs  Lab 08/07/17 1220 08/08/17 0656  WBC 9.6 5.4  NEUTROABS 7.7  --   HGB 9.9* 9.4*  HCT 31.6* 29.6*  MCV 98.4 97.4  PLT 214 202   Basic Metabolic Panel: Recent Labs  Lab 08/07/17 1220 08/08/17 0656  NA 140 137  K 3.3* 4.4  CL 104 102  CO2 24 23  GLUCOSE 129* 185*  BUN 17 25*  CREATININE 1.22* 1.35*  CALCIUM 9.8 9.7   GFR: Estimated Creatinine Clearance: 28.5 mL/min (A) (by C-G formula based on SCr of 1.35 mg/dL (H)). Liver Function Tests: No results for input(s): AST, ALT, ALKPHOS, BILITOT, PROT, ALBUMIN in the last 168 hours. No results for input(s): LIPASE, AMYLASE in the last 168 hours. No results for input(s): AMMONIA in the last 168 hours. Coagulation Profile: Recent Labs  Lab 08/07/17 1828 08/08/17 0656  INR 1.95 2.46   Cardiac Enzymes: Recent Labs  Lab 08/07/17 1220  TROPONINI <0.03   BNP (last 3 results) No results for input(s): PROBNP in the last 8760 hours. HbA1C: Recent Labs    08/07/17 1225  HGBA1C 5.1   CBG: Recent Labs  Lab 08/07/17 1644 08/07/17 2320 08/08/17 0720 08/08/17 1107  GLUCAP 243* 262* 161* 192*   Lipid Profile: No results for input(s): CHOL, HDL, LDLCALC, TRIG, CHOLHDL, LDLDIRECT in the last 72 hours. Thyroid Function Tests: No results for input(s): TSH, T4TOTAL, FREET4, T3FREE, THYROIDAB in the last 72 hours. Anemia Panel: No results for input(s): VITAMINB12, FOLATE, FERRITIN, TIBC, IRON, RETICCTPCT in the last 72 hours. Sepsis Labs: No results for input(s): PROCALCITON, LATICACIDVEN in the last 168 hours.  Recent Results (from the past 240 hour(s))  Culture, blood (Routine X 2) w Reflex to ID Panel     Status: None (Preliminary result)   Collection Time: 08/07/17  6:28 PM  Result Value Ref Range Status   Specimen Description RIGHT  ANTECUBITAL  Final   Special Requests   Final    BOTTLES DRAWN AEROBIC AND ANAEROBIC Blood Culture adequate volume   Culture NO GROWTH < 24 HOURS  Final   Report Status PENDING  Incomplete  Culture, blood (Routine X 2) w Reflex to ID Panel     Status: None (Preliminary result)   Collection Time: 08/07/17  6:44 PM  Result Value Ref Range Status   Specimen Description LEFT ANTECUBITAL  Final   Special Requests   Final    BOTTLES DRAWN AEROBIC AND ANAEROBIC Blood Culture adequate volume   Culture NO GROWTH < 24 HOURS  Final   Report Status PENDING  Incomplete         Radiology Studies: Dg Chest Port 1 View  Result Date: 08/07/2017 CLINICAL DATA:  Chest pain EXAM: PORTABLE CHEST 1 VIEW COMPARISON:  July 29, 2016 FINDINGS: The heart size and mediastinal contours are stable. Cardiac pacemaker is identified unchanged. The heart size  is enlarged. There is pulmonary edema. Patchy consolidation of bilateral lung bases are identified. There is a small left pleural effusion. The visualized skeletal structures are stable. IMPRESSION: Mild pulmonary edema. Patchy consolidation of bilateral lung bases which can be due to atelectasis or early pneumonia. Small left pleural effusion is identified. Electronically Signed   By: Abelardo Diesel M.D.   On: 08/07/2017 12:42        Scheduled Meds: . diltiazem  120 mg Oral Daily  . ferrous sulfate  325 mg Oral Q breakfast  . insulin aspart  0-9 Units Subcutaneous TID WC  . lipase/protease/amylase  36,000 Units Oral TID WC  . mouth rinse  15 mL Mouth Rinse BID  . methylPREDNISolone (SOLU-MEDROL) injection  40 mg Intravenous Q12H  . mometasone-formoterol  2 puff Inhalation BID  . pantoprazole  40 mg Oral BID  . potassium chloride  40 mEq Oral BID  . simvastatin  10 mg Oral Daily  . warfarin  2 mg Oral Once  . Warfarin - Pharmacist Dosing Inpatient   Does not apply Q24H   Continuous Infusions: . azithromycin Stopped (08/08/17 1310)  . cefTRIAXone  (ROCEPHIN)  IV Stopped (08/08/17 0803)     LOS: 1 day    Time spent: 35 minutes    Hosie Poisson, MD [facility] Pager [phone] If 7PM-7AM, please contact night-coverage [email or web address] Password [email or web address] 08/08/2017, 2:12 PM

## 2017-08-08 NOTE — Progress Notes (Signed)
ANTICOAGULATION CONSULT NOTE - follow up  Pharmacy Consult for Coumadin (home med) Indication: atrial fibrillation  Allergies  Allergen Reactions  . Macrobid [Nitrofurantoin Monohyd Macro] Rash  . Torsemide Nausea And Vomiting  . Tramadol Nausea Only   Patient Measurements: Height: 5\' 2"  (157.5 cm) Weight: 134 lb (60.8 kg) IBW/kg (Calculated) : 50.1  Vital Signs: Temp: 97.9 F (36.6 C) (12/30 0436) Temp Source: Oral (12/30 0436) BP: 131/55 (12/30 0436) Pulse Rate: 58 (12/30 0436)  Labs: Recent Labs    08/07/17 1220 08/07/17 1828 08/08/17 0656  HGB 9.9*  --  9.4*  HCT 31.6*  --  29.6*  PLT 214  --  194  LABPROT  --  22.1* 26.5*  INR  --  1.95 2.46  CREATININE 1.22*  --  1.35*  TROPONINI <0.03  --   --     Estimated Creatinine Clearance: 28.5 mL/min (A) (by C-G formula based on SCr of 1.35 mg/dL (H)).  Medical History: Past Medical History:  Diagnosis Date  . Abnormal CT of the chest    Mediastinal and hilar adenopathy followed by Dr. Koleen Nimrod in [place]  . Anemia, unspecified   . Body mass index (BMI) of 20.0-20.9 in adult FEB 2013 147 LBS  . CAD (coronary artery disease) 2011   Catheterization August 2011: mild nonobstructive coronary artery disease complicated by large left rectus sheath hematoma status post evacuation Coumadin restarted without recurrence  . Carotid artery disease ([place])    Doppler, 05/2012, 0-39% bilateral  . Cataract   . Chronic airway obstruction, not elsewhere classified   . Chronic kidney disease, stage II (mild)   . Congestive heart failure, unspecified    EF now 60%, previous nonischemic cardiomyopathy  . Diabetes mellitus   . Dilated bile duct 10/19/2011  . Diverticulitis Dec 2012  . Gastritis    By EGD  . GERD (gastroesophageal reflux disease)   . Hypercholesterolemia   . Hypertension   . Mitral regurgitation 06/27/2013  . Neurogenic sleep apnea    Uses noctural oxygen prescribed by her pulmonologi  . Pacemaker    Single  chamber [place]. Jude ACCENT [id number] SN 719 04/11/1959 10/31/2010  . Permanent atrial fibrillation (HCC)    H/o tachybradycardia syndrome on Coumadin anticoagulation, has pacemaker.  . Recurrent UTI   . Rheumatic heart disease   . Tachycardia-bradycardia [facility])    s/p St. Jude single chamber PPM 10/2010   . Unspecified hypertensive kidney disease with chronic kidney disease stage I through stage IV, or unspecified(403.90)   . Valvular heart disease    a. H/o moderate mitral stenosis by cath 2011 but no mention of this on 10/2011 echo. b. 10/2011 echo: mod MR, mild AS, mild TR; EF>65%  . Vitamin D deficiency     Medications:  Medications Prior to Admission  Medication Sig Dispense Refill Last Dose  . acetaminophen (TYLENOL) 500 MG tablet Take 1,000 mg by mouth every 6 (six) hours as needed for mild pain, moderate pain or fever.    unknown  . Blood Glucose Calibration (TRUE METRIX LEVEL 1) LOW SOLN Use weekly 3 each 1 08/06/2017 at Unknown time  . Blood Glucose Monitoring Suppl (TRUE METRIX AIR GLUCOSE METER) DEVI 1 Device by Does not apply route 2 (two) times daily. Use to Test blood sugar bid. DX E13.10 1 Device 0 08/06/2017 at Unknown time  . calcium-vitamin D 250-100 MG-UNIT tablet Take 2 tablets by mouth daily.   08/07/2017 at Unknown time  . clonazePAM (KLONOPIN) 0.5 MG tablet TAKE  ONE (1) TABLET THREE (3) TIMES EACH DAY 90 tablet 1 08/07/2017 at Unknown time  . CREON 36000 units CPEP capsule TAKE 3 CAPSULES WITH MEALS  AND TAKE 1 CAPSULE WITH A SNACK AS DIRECTED 900 capsule 2 08/07/2017 at Unknown time  . denosumab (PROLIA) 60 MG/ML SOLN injection INJECT 60MG  (1ML) SUBCUTANEOUSLY EVERY SIX MONTHS 1 each 0 past 6 months  . diltiazem (CARDIZEM) 120 MG tablet TAKE 1 TABLET EVERY DAY 90 tablet 1 08/06/2017 at Unknown time  . ferrous sulfate 325 (65 FE) MG tablet Take 1 tablet (325 mg total) by mouth daily with breakfast.  3 08/06/2017 at Unknown time  . furosemide (LASIX) 40 MG tablet Take 1 tablet  (40 mg total) by mouth as needed for edema (leg swelling).   unknown  . glucose blood (TRUE METRIX BLOOD GLUCOSE TEST) test strip TEST TWO TIMES DAILY AS DIRECTED 100 each 5 08/06/2017 at Unknown time  . HYDROcodone-acetaminophen (NORCO) 7.5-325 MG tablet Take 1 tablet by mouth 3 (three) times daily as needed (pain).    08/07/2017 at Unknown time  . ipratropium-albuterol (DUONEB) 0.5-2.5 (3) MG/3ML SOLN INHALE THE CONTENTS OF 1 VIAL VIA NEBULIZER EVERY 4 HOURS AS NEEDED 1080 mL 1 08/06/2017 at Unknown time  . JANUVIA 50 MG tablet TAKE 1 TABLET EVERY DAY 90 tablet 0 08/07/2017 at Unknown time  . nitroGLYCERIN (NITROSTAT) 0.4 MG SL tablet Place 1 tablet (0.4 mg total) under the tongue every 5 (five) minutes as needed for chest pain. 25 tablet 3 unknown  . OXYGEN Inhale 2 L into the lungs at bedtime. 2lpm with sleep only    Taking  . pantoprazole (PROTONIX) 40 MG tablet TAKE 1 TABLET TWICE DAILY 180 tablet 1 08/07/2017 at Unknown time  . potassium chloride SA (K-DUR,KLOR-CON) 20 MEQ tablet Take 1 tablet (20 mEq total) by mouth every morning. 90 tablet 1 08/07/2017 at Unknown time  . Probiotic Product ([place]) CAPS Take 1 capsule by mouth daily.   unknown  . simvastatin (ZOCOR) 10 MG tablet TAKE 1 TABLET EVERY DAY 90 tablet 1 08/07/2017 at Unknown time  . SYMBICORT 160-4.5 MCG/ACT inhaler INHALE 2 PUFFS TWICE DAILY 3 Inhaler 1 unknown  . TRUEPLUS LANCETS 28G MISC TEST BLOOD SUGAR TWICE DAILY 200 each 1 08/07/2017 at Unknown time  . VENTOLIN HFA 108 (90 Base) MCG/ACT inhaler USE 2 PUFFS EVERY 6 HOURS AS NEEDED 18 g 4 unknown  . warfarin (COUMADIN) 2 MG tablet TAKE PER PHYSICIAN INSTRUCTIONS 135 tablet 1 08/07/2017 at Unknown time   Assessment: 82yo female on chronic Coumadin PTA.  INR now therapeutic.  Acute illness and antibiotics may cause INR to rise quickly.    Goal of Therapy:  INR 2-3 Monitor platelets by anticoagulation protocol: Yes   Plan:  Coumadin 2mg  x 1 today INR  daily  Osmani Kersten A 08/08/2017,10:11 AM

## 2017-08-09 DIAGNOSIS — I10 Essential (primary) hypertension: Secondary | ICD-10-CM

## 2017-08-09 DIAGNOSIS — E785 Hyperlipidemia, unspecified: Secondary | ICD-10-CM

## 2017-08-09 DIAGNOSIS — J449 Chronic obstructive pulmonary disease, unspecified: Secondary | ICD-10-CM

## 2017-08-09 DIAGNOSIS — E1121 Type 2 diabetes mellitus with diabetic nephropathy: Secondary | ICD-10-CM

## 2017-08-09 DIAGNOSIS — J189 Pneumonia, unspecified organism: Secondary | ICD-10-CM

## 2017-08-09 DIAGNOSIS — I5033 Acute on chronic diastolic (congestive) heart failure: Secondary | ICD-10-CM

## 2017-08-09 LAB — GLUCOSE, CAPILLARY
GLUCOSE-CAPILLARY: 145 mg/dL — AB (ref 65–99)
GLUCOSE-CAPILLARY: 164 mg/dL — AB (ref 65–99)
Glucose-Capillary: 120 mg/dL — ABNORMAL HIGH (ref 65–99)

## 2017-08-09 LAB — HIV ANTIBODY (ROUTINE TESTING W REFLEX): HIV SCREEN 4TH GENERATION: NONREACTIVE

## 2017-08-09 LAB — PROTIME-INR
INR: 2.92
PROTHROMBIN TIME: 30.3 s — AB (ref 11.4–15.2)

## 2017-08-09 MED ORDER — WARFARIN SODIUM 1 MG PO TABS
1.0000 mg | ORAL_TABLET | Freq: Once | ORAL | Status: AC
  Administered 2017-08-09: 1 mg via ORAL
  Filled 2017-08-09: qty 1

## 2017-08-09 MED ORDER — ONDANSETRON HCL 4 MG/2ML IJ SOLN
4.0000 mg | Freq: Four times a day (QID) | INTRAMUSCULAR | Status: DC
  Filled 2017-08-09: qty 2

## 2017-08-09 MED ORDER — AMOXICILLIN-POT CLAVULANATE 875-125 MG PO TABS: 1.0000 | ORAL_TABLET | Freq: Two times a day (BID) | ORAL | 0 refills | Status: AC

## 2017-08-09 MED ORDER — ONDANSETRON HCL 4 MG/2ML IJ SOLN
4.0000 mg | Freq: Four times a day (QID) | INTRAMUSCULAR | Status: DC | PRN
  Administered 2017-08-09: 4 mg via INTRAVENOUS

## 2017-08-09 MED ORDER — GUAIFENESIN ER 600 MG PO TB12: 600.0000 mg | ORAL_TABLET | Freq: Two times a day (BID) | ORAL | 2 refills | Status: AC

## 2017-08-09 MED ORDER — PREDNISONE 10 MG PO TABS: ORAL_TABLET | ORAL | 0 refills | Status: DC

## 2017-08-09 NOTE — Progress Notes (Signed)
Pt's IV catheter removed and intact. Pt's IV site clean dry and intact. Discharge instructions including medications and follow up appointments were reviewed and discussed with patient's daughter. All questions were answered and no further questions at this time. Pt in stable condition and in no acute distress at time of discharge. Pt escorted by nurse tech.

## 2017-08-09 NOTE — Care Management Note (Addendum)
Case Management Note  Patient Details  Name: [name] MRN: [id number] Date of Birth: 02/15/1935  Subjective/Objective: Adm with CAP. From home alone, ind with ADL's. Walks with RW. Reports her daughter is looking into hiring someone to stay with her. She has concentrator provided by [facility] for night time use. Will need O2 assessment prior to DC to assess for continuous need. Recommended for [facility] PT. Offered choice of [facility]. She would like [facility].                Action/Plan:Linda of [place] notified and will obtain orders from Epic when available.   1445: Patient will need continuous oxygen. Vaughan Basta of [facility] notified. Port 02 tank will be delivered to room prior to discharge.   Expected Discharge Date:     08/09/2017             Expected Discharge Plan:  [place]  In-House Referral:     Discharge planning Services  CM Consult  Post Acute Care Choice:  Home Health Choice offered to:  Patient  DME Arranged:   oxygen DME Agency[facility]   [facility]  HH Arranged:  PT [place] Agency:  [place]  Status of Service:  Completed, signed off  If discussed at [place] of Stay Meetings, dates discussed:    Additional Comments:  Charles Andringa, Chauncey Reading, RN 08/09/2017, 11:58 AM

## 2017-08-09 NOTE — Progress Notes (Signed)
SATURATION QUALIFICATIONS: (This note is used to comply with regulatory documentation for home oxygen)  Patient Saturations on Room Air at Rest = 100%  Patient Saturations on Room Air while Ambulating = 85%  Patient Saturations on 2 Liters of oxygen while Ambulating = 95 %  Please briefly explain why patient needs home oxygen:

## 2017-08-09 NOTE — Discharge Summary (Signed)
Physician Discharge Summary  [name] DPO:[id number] DOB: 23-Aug-1934 DOA: 08/07/2017  PCP: [name], FNP  Admit date: 08/07/2017 Discharge date: 08/09/2017  Admitted From: Home Disposition: Home  Recommendations for Outpatient Follow-up:  1. Follow up with PCP in 1-2 weeks 2. Please obtain BMP/CBC in one week 3. Repeat chest x-ray in 3-4 weeks  Home Health: Home health RN, home health PT Equipment/Devices: Oxygen 2 L  Discharge Condition: Stable CODE STATUS: Full code Diet recommendation: Heart Healthy / Carb Modified   Brief/Interim Summary: [name] Robertsonis a 82 y.o.femalewith medical history significant ofCAD. Stage 2 ckd, noted artery disease, hypertension, hyperlipidemia, GERD, type 2 diabetes mellitus, COPD on 2 L of nasal cannula oxygen at night comes in for fever chills shortness of breath since 3 days, associated with nonproductive cough. At baseline she is on 2 L of nasal cannula oxygen to be used only at night. But over the last 3 days she continues to worsen despite oxygen on arrival to ED,he was febrile with mild leukocytosis and chest x-ray shows basilar opacities suspicious for pneumonia or interstitial fluid. He was referred to medical service for evaluation and management of community-acquired pneumonia versus acute on chronic mild diastolic heart failure    Discharge Diagnoses:  Active Problems:   Type 2 diabetes mellitus with kidney complication, without long-term current use of insulin (HCC)   Essential hypertension   Permanent atrial fibrillation (HCC)   COPD GOLD O    Chronic anticoagulation   CAD (coronary artery disease)   Tobacco abuse   Acute on chronic diastolic CHF (congestive heart failure) (HCC)   GERD (gastroesophageal reflux disease)   Hyperlipidemia with target LDL less than 100   CAP (community acquired pneumonia)  1. Community-acquired pneumonia.  Patient started on intravenous antibiotics as well as  pulmonary hygiene.  She was treated with Rocephin and azithromycin.  Overall respiratory status has improved.  She is been transitioned to oral Augmentin.  She will a repeat chest x-ray in 3-4 weeks. 2. COPD exacerbation.  Treated with steroids, bronchodilators.  Appears to be improving.  Continue on prednisone taper.  The patient wears oxygen at night.  On ambulation today, she was noted to desaturate and will need to wear oxygen during the day as well on discharge.   3. Acute on chronic diastolic CHF, mild, treated with IV Lasix.  Volume status -3.1 L.  No evidence of volume overload.  Transition back to oral Lasix on discharge 4. Hypertension.  Stable.  Continue home regimen 5. Chronic atrial fibrillation.  Rate controlled with Cardizem.  Anticoagulated with Coumadin. 6. Hyperlipidemia.  Continue statin 7. Diabetes.  Blood sugar stable.  Treated with sliding scale insulin.  Resume Januvia on discharge.  Discharge Instructions  Discharge Instructions    Diet - low sodium heart healthy   Complete by:  As directed    Increase activity slowly   Complete by:  As directed      Allergies as of 08/09/2017      Reactions   Macrobid [nitrofurantoin Monohyd Macro] Rash   Torsemide Nausea And Vomiting   Tramadol Nausea Only      Medication List    TAKE these medications   acetaminophen 500 MG tablet Commonly known as:  TYLENOL Take 1,000 mg by mouth every 6 (six) hours as needed for mild pain, moderate pain or fever.   amoxicillin-clavulanate 875-125 MG tablet Commonly known as:  AUGMENTIN Take 1 tablet by mouth 2 (two) times daily for 4 days.   calcium-vitamin D  250-100 MG-UNIT tablet Take 2 tablets by mouth daily.   clonazePAM 0.5 MG tablet Commonly known as:  KLONOPIN TAKE ONE (1) TABLET THREE (3) TIMES EACH DAY   CREON 36000 UNITS Cpep capsule Generic drug:  lipase/protease/amylase TAKE 3 CAPSULES WITH MEALS  AND TAKE 1 CAPSULE WITH A SNACK AS DIRECTED   denosumab 60 MG/ML  Soln injection Commonly known as:  PROLIA INJECT 60MG  (1ML) SUBCUTANEOUSLY EVERY SIX MONTHS   diltiazem 120 MG tablet Commonly known as:  CARDIZEM TAKE 1 TABLET EVERY DAY   ferrous sulfate 325 (65 FE) MG tablet Take 1 tablet (325 mg total) by mouth daily with breakfast.   furosemide 40 MG tablet Commonly known as:  LASIX Take 1 tablet (40 mg total) by mouth as needed for edema (leg swelling).   glucose blood test strip Commonly known as:  TRUE METRIX BLOOD GLUCOSE TEST TEST TWO TIMES DAILY AS DIRECTED   guaiFENesin 600 MG 12 hr tablet Commonly known as:  MUCINEX Take 1 tablet (600 mg total) by mouth 2 (two) times daily.   HYDROcodone-acetaminophen 7.5-325 MG tablet Commonly known as:  NORCO Take 1 tablet by mouth 3 (three) times daily as needed (pain).   ipratropium-albuterol 0.5-2.5 (3) MG/3ML Soln Commonly known as:  DUONEB INHALE THE CONTENTS OF 1 VIAL VIA NEBULIZER EVERY 4 HOURS AS NEEDED   JANUVIA 50 MG tablet Generic drug:  sitaGLIPtin TAKE 1 TABLET EVERY DAY   nitroGLYCERIN 0.4 MG SL tablet Commonly known as:  NITROSTAT Place 1 tablet (0.4 mg total) under the tongue every 5 (five) minutes as needed for chest pain.   OXYGEN Inhale 2 L into the lungs at bedtime. 2lpm with sleep only   pantoprazole 40 MG tablet Commonly known as:  PROTONIX TAKE 1 TABLET TWICE DAILY   PHILLIPS COLON HEALTH Caps Take 1 capsule by mouth daily.   potassium chloride SA 20 MEQ tablet Commonly known as:  K-DUR,KLOR-CON Take 1 tablet (20 mEq total) by mouth every morning.   predniSONE 10 MG tablet Commonly known as:  DELTASONE Take 40mg  po daily for 2 days then 30mg  daily for 2 days then 20mg  daily for 2 days then 10mg  daily for 2 days then stop   simvastatin 10 MG tablet Commonly known as:  ZOCOR TAKE 1 TABLET EVERY DAY   SYMBICORT 160-4.5 MCG/ACT inhaler Generic drug:  budesonide-formoterol INHALE 2 PUFFS TWICE DAILY   TRUE METRIX AIR GLUCOSE METER Devi 1 Device by Does  not apply route 2 (two) times daily. Use to Test blood sugar bid. DX E13.10   TRUE METRIX LEVEL 1 Low Soln Use weekly   TRUEPLUS LANCETS 28G Misc TEST BLOOD SUGAR TWICE DAILY   VENTOLIN HFA 108 (90 Base) MCG/ACT inhaler Generic drug:  albuterol USE 2 PUFFS EVERY 6 HOURS AS NEEDED   warfarin 2 MG tablet Commonly known as:  COUMADIN Take as directed. If you are unsure how to take this medication, talk to your nurse or doctor. Original instructions:  TAKE PER PHYSICIAN INSTRUCTIONS            Durable Medical Equipment  (From admission, onward)        Start     Ordered   08/09/17 1533  For home use only DME oxygen  Once    Question Answer Comment  Mode or (Route) Nasal cannula   Liters per Minute 2   Frequency Continuous (stationary and portable oxygen unit needed)   Oxygen conserving device Yes   Oxygen delivery system Gas  08/09/17 1532   08/09/17 1503  For home use only DME oxygen  Once    Question Answer Comment  Mode or (Route) Nasal cannula   Liters per Minute 2   Frequency Continuous (stationary and portable oxygen unit needed)   Oxygen conserving device Yes   Oxygen delivery system Gas      08/09/17 1503      Allergies  Allergen Reactions  . Macrobid [Nitrofurantoin Monohyd Macro] Rash  . Torsemide Nausea And Vomiting  . Tramadol Nausea Only    Consultations:     Procedures/Studies: Dg Abd 1 View  Result Date: 07/14/2017 CLINICAL DATA:  Abdominal pain and diarrhea for the past 3 days. EXAM: ABDOMEN - 1 VIEW COMPARISON:  Abdominal and pelvic CT scan of December 05, 2015 FINDINGS: There are loops of mildly distended bowel present which appear relatively featureless. No free extraluminal gas collections are observed. Some gas and stool in the rectum is observed. No abnormal soft tissue calcifications are demonstrated. IMPRESSION: Loops of mildly distended bowel within the abdomen could reflect an ileus or partial obstruction. It is difficult to judge  with confidence whether the loops demonstrated reflect small or large bowel although I favor large bowel. Given the history of diverticulosis, contrast-enhanced abdominal and pelvic CT scanning would be useful. Electronically Signed   By: David  Martinique M.D.   On: 07/14/2017 08:01   Dg Chest Port 1 View  Result Date: 08/07/2017 CLINICAL DATA:  Chest pain EXAM: PORTABLE CHEST 1 VIEW COMPARISON:  July 29, 2016 FINDINGS: The heart size and mediastinal contours are stable. Cardiac pacemaker is identified unchanged. The heart size is enlarged. There is pulmonary edema. Patchy consolidation of bilateral lung bases are identified. There is a small left pleural effusion. The visualized skeletal structures are stable. IMPRESSION: Mild pulmonary edema. Patchy consolidation of bilateral lung bases which can be due to atelectasis or early pneumonia. Small left pleural effusion is identified. Electronically Signed   By: Abelardo Diesel M.D.   On: 08/07/2017 12:42     Subjective: Feels weak.  Continues to have productive cough.  Discharge Exam: Vitals:   08/09/17 1038 08/09/17 1336  BP: 127/71 (!) 115/51  Pulse:  75  Resp:  18  Temp:  98 F (36.7 C)  SpO2:  97%   Vitals:   08/09/17 0340 08/09/17 0955 08/09/17 1038 08/09/17 1336  BP: 139/74  127/71 (!) 115/51  Pulse: 97   75  Resp: 19   18  Temp: 97.8 F (36.6 C)   98 F (36.7 C)  TempSrc: Oral   Oral  SpO2: 98% 100%  97%  Weight: 60.7 kg (133 lb 12.8 oz)     Height:        General: Pt is alert, awake, not in acute distress Cardiovascular: RRR, S1/S2 +, no rubs, no gallops Respiratory: CTA bilaterally, no wheezing, no rhonchi Abdominal: Soft, NT, ND, bowel sounds + Extremities: no edema, no cyanosis    The results of significant diagnostics from this hospitalization (including imaging, microbiology, ancillary and laboratory) are listed below for reference.     Microbiology: Recent Results (from the past 240 hour(s))  Culture, blood  (Routine X 2) w Reflex to ID Panel     Status: None (Preliminary result)   Collection Time: 08/07/17  6:28 PM  Result Value Ref Range Status   Specimen Description RIGHT ANTECUBITAL  Final   Special Requests   Final    BOTTLES DRAWN AEROBIC AND ANAEROBIC Blood Culture adequate volume  Culture NO GROWTH 2 DAYS  Final   Report Status PENDING  Incomplete  Culture, blood (Routine X 2) w Reflex to ID Panel     Status: None (Preliminary result)   Collection Time: 08/07/17  6:44 PM  Result Value Ref Range Status   Specimen Description LEFT ANTECUBITAL  Final   Special Requests   Final    BOTTLES DRAWN AEROBIC AND ANAEROBIC Blood Culture adequate volume   Culture NO GROWTH 2 DAYS  Final   Report Status PENDING  Incomplete     Labs: BNP (last 3 results) No results for input(s): BNP in the last 8760 hours. Basic Metabolic Panel: Recent Labs  Lab 08/07/17 1220 08/08/17 0656  NA 140 137  K 3.3* 4.4  CL 104 102  CO2 24 23  GLUCOSE 129* 185*  BUN 17 25*  CREATININE 1.22* 1.35*  CALCIUM 9.8 9.7   Liver Function Tests: No results for input(s): AST, ALT, ALKPHOS, BILITOT, PROT, ALBUMIN in the last 168 hours. No results for input(s): LIPASE, AMYLASE in the last 168 hours. No results for input(s): AMMONIA in the last 168 hours. CBC: Recent Labs  Lab 08/07/17 1220 08/08/17 0656  WBC 9.6 5.4  NEUTROABS 7.7  --   HGB 9.9* 9.4*  HCT 31.6* 29.6*  MCV 98.4 97.4  PLT 214 194   Cardiac Enzymes: Recent Labs  Lab 08/07/17 1220  TROPONINI <0.03   BNP: Invalid input(s): POCBNP CBG: Recent Labs  Lab 08/08/17 1609 08/08/17 2116 08/09/17 0719 08/09/17 1109 08/09/17 1719  GLUCAP 141* 142* 120* 145* 164*   D-Dimer No results for input(s): DDIMER in the last 72 hours. Hgb A1c Recent Labs    08/07/17 1225  HGBA1C 5.1   Lipid Profile No results for input(s): CHOL, HDL, LDLCALC, TRIG, CHOLHDL, LDLDIRECT in the last 72 hours. Thyroid function studies No results for input(s):  TSH, T4TOTAL, T3FREE, THYROIDAB in the last 72 hours.  Invalid input(s): FREET3 Anemia work up No results for input(s): VITAMINB12, FOLATE, FERRITIN, TIBC, IRON, RETICCTPCT in the last 72 hours. Urinalysis    Component Value Date/Time   COLORURINE YELLOW 11/25/2015 2255   APPEARANCEUR Clear 09/14/2016 1418   LABSPEC 1.010 11/25/2015 2255   PHURINE 7.0 11/25/2015 2255   GLUCOSEU Negative 09/14/2016 1418   HGBUR NEGATIVE 11/25/2015 2255   BILIRUBINUR Negative 09/14/2016 1418   KETONESUR NEGATIVE 11/25/2015 2255   PROTEINUR Negative 09/14/2016 1418   PROTEINUR NEGATIVE 11/25/2015 2255   UROBILINOGEN negative 02/28/2015 1602   UROBILINOGEN 0.2 08/21/2013 1922   NITRITE Negative 09/14/2016 1418   NITRITE NEGATIVE 11/25/2015 2255   LEUKOCYTESUR 1+ (A) 09/14/2016 1418   Sepsis Labs Invalid input(s): PROCALCITONIN,  WBC,  LACTICIDVEN Microbiology Recent Results (from the past 240 hour(s))  Culture, blood (Routine X 2) w Reflex to ID Panel     Status: None (Preliminary result)   Collection Time: 08/07/17  6:28 PM  Result Value Ref Range Status   Specimen Description RIGHT ANTECUBITAL  Final   Special Requests   Final    BOTTLES DRAWN AEROBIC AND ANAEROBIC Blood Culture adequate volume   Culture NO GROWTH 2 DAYS  Final   Report Status PENDING  Incomplete  Culture, blood (Routine X 2) w Reflex to ID Panel     Status: None (Preliminary result)   Collection Time: 08/07/17  6:44 PM  Result Value Ref Range Status   Specimen Description LEFT ANTECUBITAL  Final   Special Requests   Final    BOTTLES DRAWN AEROBIC AND  ANAEROBIC Blood Culture adequate volume   Culture NO GROWTH 2 DAYS  Final   Report Status PENDING  Incomplete     Time coordinating discharge: Over 30 minutes  SIGNED:   Kathie Dike, MD  [facility] 08/09/2017, 6:02 PM Pager   If 7PM-7AM, please contact night-coverage [email or web address] Password [email or web address]

## 2017-08-09 NOTE — Plan of Care (Signed)
  Acute Rehab PT Goals(only PT should resolve) Pt Will Transfer Bed To Chair/Chair To Bed 08/09/2017 1414 - Progressing by Lonell Grandchild, PT Flowsheets Taken 08/09/2017 1414  Pt will Transfer Bed to Chair/Chair to Bed with modified independence Pt Will Ambulate 08/09/2017 1414 - Progressing by Lonell Grandchild, PT Flowsheets Taken 08/09/2017 1414  Pt will Ambulate 75 feet;with rolling walker;with modified independence Pt Will Go Up/Down Stairs 08/09/2017 1417 - Progressing by Lonell Grandchild, PT Flowsheets Taken 08/09/2017 1417  Pt will Go Up / Down Stairs 3-5 stairs;with supervision;with rail(s)  2:18 PM, 08/09/17 Lonell Grandchild, MPT Physical Therapist with [facility] [phone] office [phone] mobile phone

## 2017-08-09 NOTE — Evaluation (Signed)
Physical Therapy Evaluation Patient Details Name: [name] MRN: [id number] DOB: 03/30/1935 Today's Date: 08/09/2017   History of Present Illness  [name] is a 82 y.o. female with medical history significant of CAD.  Stage 2 ckd, noted artery disease, hypertension, hyperlipidemia, GERD, type 2 diabetes mellitus, COPD on 2 L of nasal cannula oxygen at night comes in for fever chills shortness of breath since 3 days, associated with nonproductive cough.  At baseline she is on 2 L of nasal cannula oxygen to be used only at night.  But over the last 3 days she continues to worsen despite oxygen.     Clinical Impression  Patient limited for functional mobility as stated below secondary to BLE weakness, fatigue and fair standing balance.  Patient will benefit from continued physical therapy in hospital and recommended venue below to increase strength, balance, endurance for safe ADLs and gait.    Follow Up Recommendations Home health PT    Equipment Recommendations  None recommended by PT    Recommendations for Other Services       Precautions / Restrictions Precautions Precautions: Fall Restrictions Weight Bearing Restrictions: No      Mobility  Bed Mobility Overal bed mobility: Independent                Transfers Overall transfer level: Modified independent                  Ambulation/Gait Ambulation/Gait assistance: Supervision Ambulation Distance (Feet): 50 Feet Assistive device: Rolling walker (2 wheeled) Gait Pattern/deviations: Decreased step length - right;Decreased step length - left;Decreased stride length   Gait velocity interpretation: Below normal speed for age/gender General Gait Details: demonstrates slow slightly labored cadence without loss of balance, limited secondary to c/o fatigue and O2 sats after walking and transfer to chair at 100% while on 2 LPM O2  Stairs            Wheelchair Mobility    Modified Rankin  (Stroke Patients Only)       Balance Overall balance assessment: Needs assistance Sitting-balance support: No upper extremity supported;Feet supported Sitting balance-Leahy Scale: Good     Standing balance support: Bilateral upper extremity supported;During functional activity Standing balance-Leahy Scale: Fair                               Pertinent Vitals/Pain Pain Assessment: No/denies pain    Home Living Family/patient expects to be discharged to:: Private residence Living Arrangements: Alone   Type of Home: Mobile home Home Access: Stairs to enter Entrance Stairs-Rails: Right;Left;Can reach both Entrance Stairs-Number of Steps: 5 Home Layout: One level Home Equipment: Walker - 2 wheels;Cane - single point;Bedside commode;Shower seat      Prior Function Level of Independence: Independent with assistive device(s)         Comments: ambulate without assistive device household, uses RW for community     Hand Dominance        Extremity/Trunk Assessment   Upper Extremity Assessment Upper Extremity Assessment: Generalized weakness    Lower Extremity Assessment Lower Extremity Assessment: Generalized weakness    Cervical / Trunk Assessment Cervical / Trunk Assessment: Normal  Communication   Communication: No difficulties  Cognition Arousal/Alertness: Awake/alert Behavior During Therapy: WFL for tasks assessed/performed Overall Cognitive Status: Within Functional Limits for tasks assessed  General Comments      Exercises     Assessment/Plan    PT Assessment Patient needs continued PT services  PT Problem List Decreased strength;Decreased activity tolerance;Decreased balance;Decreased mobility;Cardiopulmonary status limiting activity       PT Treatment Interventions Gait training;Stair training;Therapeutic activities;Therapeutic exercise;Patient/family education    PT Goals  (Current goals can be found in the Care Plan section)  Acute Rehab PT Goals Patient Stated Goal: return home PT Goal Formulation: With patient Time For Goal Achievement: 08/12/17 Potential to Achieve Goals: Good    Frequency Min 3X/week   Barriers to discharge        Co-evaluation               AM-PAC PT "6 Clicks" Daily Activity  Outcome Measure Difficulty turning over in bed (including adjusting bedclothes, sheets and blankets)?: None Difficulty moving from lying on back to sitting on the side of the bed? : None Difficulty sitting down on and standing up from a chair with arms (e.g., wheelchair, bedside commode, etc,.)?: None Help needed moving to and from a bed to chair (including a wheelchair)?: None Help needed walking in hospital room?: A Little Help needed climbing 3-5 steps with a railing? : A Little 6 Click Score: 22    End of Session   Activity Tolerance: Patient tolerated treatment well;Patient limited by fatigue Patient left: in chair;with call bell/phone within reach Nurse Communication: Mobility status PT Visit Diagnosis: Unsteadiness on feet (R26.81);Other abnormalities of gait and mobility (R26.89);Muscle weakness (generalized) (M62.81)    Time: 1010-1033 PT Time Calculation (min) (ACUTE ONLY): 23 min   Charges:   PT Evaluation $PT Eval Low Complexity: 1 Low PT Treatments $Therapeutic Activity: 23-37 mins   PT G Codes:   PT G-Codes **NOT FOR INPATIENT CLASS** Functional Assessment Tool Used: AM-PAC 6 Clicks Basic Mobility Functional Limitation: Mobility: Walking and moving around Mobility: Walking and Moving Around Current Status (H8299): At least 20 percent but less than 40 percent impaired, limited or restricted Mobility: Walking and Moving Around Goal Status [phone]): At least 1 percent but less than 20 percent impaired, limited or restricted Mobility: Walking and Moving Around Discharge Status [phone]): At least 20 percent but less than 40  percent impaired, limited or restricted    2:12 PM, 08/09/17 Lonell Grandchild, MPT Physical Therapist with [facility] [phone] office [phone] mobile phone

## 2017-08-09 NOTE — Progress Notes (Signed)
ANTICOAGULATION CONSULT NOTE - follow up  Pharmacy Consult for Coumadin (home med) Indication: atrial fibrillation  Allergies  Allergen Reactions  . Macrobid [Nitrofurantoin Monohyd Macro] Rash  . Torsemide Nausea And Vomiting  . Tramadol Nausea Only   Patient Measurements: Height: 5\' 2"  (157.5 cm) Weight: 133 lb 12.8 oz (60.7 kg) IBW/kg (Calculated) : 50.1  Vital Signs: Temp: 97.8 F (36.6 C) (12/31 0340) Temp Source: Oral (12/31 0340) BP: 127/71 (12/31 1038) Pulse Rate: 97 (12/31 0340)  Labs: Recent Labs    08/07/17 1220 08/07/17 1828 08/08/17 0656 08/09/17 0542  HGB 9.9*  --  9.4*  --   HCT 31.6*  --  29.6*  --   PLT 214  --  194  --   LABPROT  --  22.1* 26.5* 30.3*  INR  --  1.95 2.46 2.92  CREATININE 1.22*  --  1.35*  --   TROPONINI <0.03  --   --   --     Estimated Creatinine Clearance: 28.5 mL/min (A) (by C-G formula based on SCr of 1.35 mg/dL (H)).  Medical History: Past Medical History:  Diagnosis Date  . Abnormal CT of the chest    Mediastinal and hilar adenopathy followed by Dr. Koleen Nimrod in [place]  . Anemia, unspecified   . Body mass index (BMI) of 20.0-20.9 in adult FEB 2013 147 LBS  . CAD (coronary artery disease) 2011   Catheterization August 2011: mild nonobstructive coronary artery disease complicated by large left rectus sheath hematoma status post evacuation Coumadin restarted without recurrence  . Carotid artery disease ([place])    Doppler, 05/2012, 0-39% bilateral  . Cataract   . Chronic airway obstruction, not elsewhere classified   . Chronic kidney disease, stage II (mild)   . Congestive heart failure, unspecified    EF now 60%, previous nonischemic cardiomyopathy  . Diabetes mellitus   . Dilated bile duct 10/19/2011  . Diverticulitis Dec 2012  . Gastritis    By EGD  . GERD (gastroesophageal reflux disease)   . Hypercholesterolemia   . Hypertension   . Mitral regurgitation 06/27/2013  . Neurogenic sleep apnea    Uses noctural oxygen  prescribed by her pulmonologi  . Pacemaker    Single chamber [place]. Jude ACCENT [phone] SN 719 04/11/1957 10/31/2010  . Permanent atrial fibrillation (HCC)    H/o tachybradycardia syndrome on Coumadin anticoagulation, has pacemaker.  . Recurrent UTI   . Rheumatic heart disease   . Tachycardia-bradycardia [facility])    s/p St. Jude single chamber PPM 10/2010   . Unspecified hypertensive kidney disease with chronic kidney disease stage I through stage IV, or unspecified(403.90)   . Valvular heart disease    a. H/o moderate mitral stenosis by cath 2011 but no mention of this on 10/2011 echo. b. 10/2011 echo: mod MR, mild AS, mild TR; EF>65%  . Vitamin D deficiency     Medications:  Medications Prior to Admission  Medication Sig Dispense Refill Last Dose  . acetaminophen (TYLENOL) 500 MG tablet Take 1,000 mg by mouth every 6 (six) hours as needed for mild pain, moderate pain or fever.    unknown  . Blood Glucose Calibration (TRUE METRIX LEVEL 1) LOW SOLN Use weekly 3 each 1 08/06/2017 at Unknown time  . Blood Glucose Monitoring Suppl (TRUE METRIX AIR GLUCOSE METER) DEVI 1 Device by Does not apply route 2 (two) times daily. Use to Test blood sugar bid. DX E13.10 1 Device 0 08/06/2017 at Unknown time  . calcium-vitamin D 250-100 MG-UNIT  tablet Take 2 tablets by mouth daily.   08/07/2017 at Unknown time  . clonazePAM (KLONOPIN) 0.5 MG tablet TAKE ONE (1) TABLET THREE (3) TIMES EACH DAY 90 tablet 1 08/07/2017 at Unknown time  . CREON 36000 units CPEP capsule TAKE 3 CAPSULES WITH MEALS  AND TAKE 1 CAPSULE WITH A SNACK AS DIRECTED 900 capsule 2 08/07/2017 at Unknown time  . denosumab (PROLIA) 60 MG/ML SOLN injection INJECT 60MG  (1ML) SUBCUTANEOUSLY EVERY SIX MONTHS 1 each 0 past 6 months  . diltiazem (CARDIZEM) 120 MG tablet TAKE 1 TABLET EVERY DAY 90 tablet 1 08/06/2017 at Unknown time  . ferrous sulfate 325 (65 FE) MG tablet Take 1 tablet (325 mg total) by mouth daily with breakfast.  3 08/06/2017 at Unknown  time  . furosemide (LASIX) 40 MG tablet Take 1 tablet (40 mg total) by mouth as needed for edema (leg swelling).   unknown  . glucose blood (TRUE METRIX BLOOD GLUCOSE TEST) test strip TEST TWO TIMES DAILY AS DIRECTED 100 each 5 08/06/2017 at Unknown time  . HYDROcodone-acetaminophen (NORCO) 7.5-325 MG tablet Take 1 tablet by mouth 3 (three) times daily as needed (pain).    08/07/2017 at Unknown time  . ipratropium-albuterol (DUONEB) 0.5-2.5 (3) MG/3ML SOLN INHALE THE CONTENTS OF 1 VIAL VIA NEBULIZER EVERY 4 HOURS AS NEEDED 1080 mL 1 08/06/2017 at Unknown time  . JANUVIA 50 MG tablet TAKE 1 TABLET EVERY DAY 90 tablet 0 08/07/2017 at Unknown time  . nitroGLYCERIN (NITROSTAT) 0.4 MG SL tablet Place 1 tablet (0.4 mg total) under the tongue every 5 (five) minutes as needed for chest pain. 25 tablet 3 unknown  . OXYGEN Inhale 2 L into the lungs at bedtime. 2lpm with sleep only    Taking  . pantoprazole (PROTONIX) 40 MG tablet TAKE 1 TABLET TWICE DAILY 180 tablet 1 08/07/2017 at Unknown time  . potassium chloride SA (K-DUR,KLOR-CON) 20 MEQ tablet Take 1 tablet (20 mEq total) by mouth every morning. 90 tablet 1 08/07/2017 at Unknown time  . Probiotic Product ([place]) CAPS Take 1 capsule by mouth daily.   unknown  . simvastatin (ZOCOR) 10 MG tablet TAKE 1 TABLET EVERY DAY 90 tablet 1 08/07/2017 at Unknown time  . SYMBICORT 160-4.5 MCG/ACT inhaler INHALE 2 PUFFS TWICE DAILY 3 Inhaler 1 unknown  . TRUEPLUS LANCETS 28G MISC TEST BLOOD SUGAR TWICE DAILY 200 each 1 08/07/2017 at Unknown time  . VENTOLIN HFA 108 (90 Base) MCG/ACT inhaler USE 2 PUFFS EVERY 6 HOURS AS NEEDED 18 g 4 unknown  . warfarin (COUMADIN) 2 MG tablet TAKE PER PHYSICIAN INSTRUCTIONS 135 tablet 1 08/07/2017 at Unknown time   Assessment: 82yo female on chronic Coumadin PTA.  INR now therapeutic.  Acute illness and antibiotics may cause INR to rise quickly.  Home dose is 1mg  daily except 2mg  on Friday.  Goal of Therapy:  INR  2-3 Monitor platelets by anticoagulation protocol: Yes   Plan:  Coumadin 1mg  x 1 today INR daily Monitor for s/s of bleeding  Isac Sarna, BS [place], BCPS Clinical Pharmacist Pager [phone] 08/09/2017,10:52 AM

## 2017-08-10 ENCOUNTER — Other Ambulatory Visit: Payer: Self-pay | Admitting: Nurse Practitioner

## 2017-08-11 DIAGNOSIS — J441 Chronic obstructive pulmonary disease with (acute) exacerbation: Secondary | ICD-10-CM | POA: Diagnosis not present

## 2017-08-11 DIAGNOSIS — J189 Pneumonia, unspecified organism: Secondary | ICD-10-CM | POA: Diagnosis not present

## 2017-08-11 DIAGNOSIS — J440 Chronic obstructive pulmonary disease with acute lower respiratory infection: Secondary | ICD-10-CM | POA: Diagnosis not present

## 2017-08-11 DIAGNOSIS — N182 Chronic kidney disease, stage 2 (mild): Secondary | ICD-10-CM | POA: Diagnosis not present

## 2017-08-11 DIAGNOSIS — I5032 Chronic diastolic (congestive) heart failure: Secondary | ICD-10-CM | POA: Diagnosis not present

## 2017-08-11 DIAGNOSIS — I130 Hypertensive heart and chronic kidney disease with heart failure and stage 1 through stage 4 chronic kidney disease, or unspecified chronic kidney disease: Secondary | ICD-10-CM | POA: Diagnosis not present

## 2017-08-11 DIAGNOSIS — I2510 Atherosclerotic heart disease of native coronary artery without angina pectoris: Secondary | ICD-10-CM | POA: Diagnosis not present

## 2017-08-11 DIAGNOSIS — D631 Anemia in chronic kidney disease: Secondary | ICD-10-CM | POA: Diagnosis not present

## 2017-08-11 DIAGNOSIS — E1122 Type 2 diabetes mellitus with diabetic chronic kidney disease: Secondary | ICD-10-CM | POA: Diagnosis not present

## 2017-08-12 DIAGNOSIS — J188 Other pneumonia, unspecified organism: Secondary | ICD-10-CM | POA: Diagnosis not present

## 2017-08-12 DIAGNOSIS — J449 Chronic obstructive pulmonary disease, unspecified: Secondary | ICD-10-CM | POA: Diagnosis not present

## 2017-08-12 DIAGNOSIS — J441 Chronic obstructive pulmonary disease with (acute) exacerbation: Secondary | ICD-10-CM | POA: Diagnosis not present

## 2017-08-12 LAB — CULTURE, BLOOD (ROUTINE X 2)
CULTURE: NO GROWTH
Culture: NO GROWTH
SPECIAL REQUESTS: ADEQUATE
SPECIAL REQUESTS: ADEQUATE

## 2017-08-13 ENCOUNTER — Telehealth: Payer: Self-pay | Admitting: Nurse Practitioner

## 2017-08-14 NOTE — Telephone Encounter (Signed)
Please advise if ordered

## 2017-08-16 DIAGNOSIS — J441 Chronic obstructive pulmonary disease with (acute) exacerbation: Secondary | ICD-10-CM | POA: Diagnosis not present

## 2017-08-16 DIAGNOSIS — I130 Hypertensive heart and chronic kidney disease with heart failure and stage 1 through stage 4 chronic kidney disease, or unspecified chronic kidney disease: Secondary | ICD-10-CM | POA: Diagnosis not present

## 2017-08-16 DIAGNOSIS — N182 Chronic kidney disease, stage 2 (mild): Secondary | ICD-10-CM | POA: Diagnosis not present

## 2017-08-16 DIAGNOSIS — D631 Anemia in chronic kidney disease: Secondary | ICD-10-CM | POA: Diagnosis not present

## 2017-08-16 DIAGNOSIS — J189 Pneumonia, unspecified organism: Secondary | ICD-10-CM | POA: Diagnosis not present

## 2017-08-16 DIAGNOSIS — I2510 Atherosclerotic heart disease of native coronary artery without angina pectoris: Secondary | ICD-10-CM | POA: Diagnosis not present

## 2017-08-16 DIAGNOSIS — J440 Chronic obstructive pulmonary disease with acute lower respiratory infection: Secondary | ICD-10-CM | POA: Diagnosis not present

## 2017-08-16 DIAGNOSIS — I5032 Chronic diastolic (congestive) heart failure: Secondary | ICD-10-CM | POA: Diagnosis not present

## 2017-08-16 DIAGNOSIS — E1122 Type 2 diabetes mellitus with diabetic chronic kidney disease: Secondary | ICD-10-CM | POA: Diagnosis not present

## 2017-08-16 NOTE — Telephone Encounter (Signed)
Last injection was 03/09/17 and is due on 09/10/17. Will do insurance verification.

## 2017-08-17 DIAGNOSIS — J441 Chronic obstructive pulmonary disease with (acute) exacerbation: Secondary | ICD-10-CM | POA: Diagnosis not present

## 2017-08-17 DIAGNOSIS — I5032 Chronic diastolic (congestive) heart failure: Secondary | ICD-10-CM | POA: Diagnosis not present

## 2017-08-17 DIAGNOSIS — I2510 Atherosclerotic heart disease of native coronary artery without angina pectoris: Secondary | ICD-10-CM | POA: Diagnosis not present

## 2017-08-17 DIAGNOSIS — D631 Anemia in chronic kidney disease: Secondary | ICD-10-CM | POA: Diagnosis not present

## 2017-08-17 DIAGNOSIS — N182 Chronic kidney disease, stage 2 (mild): Secondary | ICD-10-CM | POA: Diagnosis not present

## 2017-08-17 DIAGNOSIS — J189 Pneumonia, unspecified organism: Secondary | ICD-10-CM | POA: Diagnosis not present

## 2017-08-17 DIAGNOSIS — E1122 Type 2 diabetes mellitus with diabetic chronic kidney disease: Secondary | ICD-10-CM | POA: Diagnosis not present

## 2017-08-17 DIAGNOSIS — J440 Chronic obstructive pulmonary disease with acute lower respiratory infection: Secondary | ICD-10-CM | POA: Diagnosis not present

## 2017-08-17 DIAGNOSIS — I130 Hypertensive heart and chronic kidney disease with heart failure and stage 1 through stage 4 chronic kidney disease, or unspecified chronic kidney disease: Secondary | ICD-10-CM | POA: Diagnosis not present

## 2017-08-18 ENCOUNTER — Other Ambulatory Visit: Payer: Self-pay

## 2017-08-18 ENCOUNTER — Other Ambulatory Visit: Payer: Self-pay | Admitting: Nurse Practitioner

## 2017-08-18 ENCOUNTER — Ambulatory Visit (INDEPENDENT_AMBULATORY_CARE_PROVIDER_SITE_OTHER): Payer: Medicare HMO | Admitting: *Deleted

## 2017-08-18 DIAGNOSIS — I495 Sick sinus syndrome: Secondary | ICD-10-CM | POA: Diagnosis not present

## 2017-08-18 DIAGNOSIS — J189 Pneumonia, unspecified organism: Secondary | ICD-10-CM | POA: Diagnosis not present

## 2017-08-18 DIAGNOSIS — E1122 Type 2 diabetes mellitus with diabetic chronic kidney disease: Secondary | ICD-10-CM | POA: Diagnosis not present

## 2017-08-18 DIAGNOSIS — N182 Chronic kidney disease, stage 2 (mild): Secondary | ICD-10-CM | POA: Diagnosis not present

## 2017-08-18 DIAGNOSIS — J440 Chronic obstructive pulmonary disease with acute lower respiratory infection: Secondary | ICD-10-CM | POA: Diagnosis not present

## 2017-08-18 DIAGNOSIS — I130 Hypertensive heart and chronic kidney disease with heart failure and stage 1 through stage 4 chronic kidney disease, or unspecified chronic kidney disease: Secondary | ICD-10-CM | POA: Diagnosis not present

## 2017-08-18 DIAGNOSIS — I5032 Chronic diastolic (congestive) heart failure: Secondary | ICD-10-CM | POA: Diagnosis not present

## 2017-08-18 DIAGNOSIS — J441 Chronic obstructive pulmonary disease with (acute) exacerbation: Secondary | ICD-10-CM | POA: Diagnosis not present

## 2017-08-18 DIAGNOSIS — D631 Anemia in chronic kidney disease: Secondary | ICD-10-CM | POA: Diagnosis not present

## 2017-08-18 DIAGNOSIS — I2510 Atherosclerotic heart disease of native coronary artery without angina pectoris: Secondary | ICD-10-CM | POA: Diagnosis not present

## 2017-08-19 DIAGNOSIS — B351 Tinea unguium: Secondary | ICD-10-CM | POA: Diagnosis not present

## 2017-08-19 DIAGNOSIS — M79676 Pain in unspecified toe(s): Secondary | ICD-10-CM | POA: Diagnosis not present

## 2017-08-19 DIAGNOSIS — E1151 Type 2 diabetes mellitus with diabetic peripheral angiopathy without gangrene: Secondary | ICD-10-CM | POA: Diagnosis not present

## 2017-08-19 DIAGNOSIS — L84 Corns and callosities: Secondary | ICD-10-CM | POA: Diagnosis not present

## 2017-08-20 DIAGNOSIS — I4891 Unspecified atrial fibrillation: Secondary | ICD-10-CM | POA: Diagnosis not present

## 2017-08-20 DIAGNOSIS — N182 Chronic kidney disease, stage 2 (mild): Secondary | ICD-10-CM | POA: Diagnosis not present

## 2017-08-20 DIAGNOSIS — J189 Pneumonia, unspecified organism: Secondary | ICD-10-CM | POA: Diagnosis not present

## 2017-08-20 DIAGNOSIS — J440 Chronic obstructive pulmonary disease with acute lower respiratory infection: Secondary | ICD-10-CM | POA: Diagnosis not present

## 2017-08-20 DIAGNOSIS — J449 Chronic obstructive pulmonary disease, unspecified: Secondary | ICD-10-CM | POA: Diagnosis not present

## 2017-08-20 DIAGNOSIS — D631 Anemia in chronic kidney disease: Secondary | ICD-10-CM | POA: Diagnosis not present

## 2017-08-20 DIAGNOSIS — I110 Hypertensive heart disease with heart failure: Secondary | ICD-10-CM | POA: Diagnosis not present

## 2017-08-20 DIAGNOSIS — I509 Heart failure, unspecified: Secondary | ICD-10-CM | POA: Diagnosis not present

## 2017-08-20 DIAGNOSIS — I2510 Atherosclerotic heart disease of native coronary artery without angina pectoris: Secondary | ICD-10-CM | POA: Diagnosis not present

## 2017-08-20 DIAGNOSIS — J441 Chronic obstructive pulmonary disease with (acute) exacerbation: Secondary | ICD-10-CM | POA: Diagnosis not present

## 2017-08-20 DIAGNOSIS — E1122 Type 2 diabetes mellitus with diabetic chronic kidney disease: Secondary | ICD-10-CM | POA: Diagnosis not present

## 2017-08-20 DIAGNOSIS — I5032 Chronic diastolic (congestive) heart failure: Secondary | ICD-10-CM | POA: Diagnosis not present

## 2017-08-20 DIAGNOSIS — I130 Hypertensive heart and chronic kidney disease with heart failure and stage 1 through stage 4 chronic kidney disease, or unspecified chronic kidney disease: Secondary | ICD-10-CM | POA: Diagnosis not present

## 2017-08-23 DIAGNOSIS — I2510 Atherosclerotic heart disease of native coronary artery without angina pectoris: Secondary | ICD-10-CM | POA: Diagnosis not present

## 2017-08-23 DIAGNOSIS — I130 Hypertensive heart and chronic kidney disease with heart failure and stage 1 through stage 4 chronic kidney disease, or unspecified chronic kidney disease: Secondary | ICD-10-CM | POA: Diagnosis not present

## 2017-08-23 DIAGNOSIS — J440 Chronic obstructive pulmonary disease with acute lower respiratory infection: Secondary | ICD-10-CM | POA: Diagnosis not present

## 2017-08-23 DIAGNOSIS — D631 Anemia in chronic kidney disease: Secondary | ICD-10-CM | POA: Diagnosis not present

## 2017-08-23 DIAGNOSIS — I5032 Chronic diastolic (congestive) heart failure: Secondary | ICD-10-CM | POA: Diagnosis not present

## 2017-08-23 DIAGNOSIS — E1122 Type 2 diabetes mellitus with diabetic chronic kidney disease: Secondary | ICD-10-CM | POA: Diagnosis not present

## 2017-08-23 DIAGNOSIS — J441 Chronic obstructive pulmonary disease with (acute) exacerbation: Secondary | ICD-10-CM | POA: Diagnosis not present

## 2017-08-23 DIAGNOSIS — N182 Chronic kidney disease, stage 2 (mild): Secondary | ICD-10-CM | POA: Diagnosis not present

## 2017-08-23 DIAGNOSIS — J189 Pneumonia, unspecified organism: Secondary | ICD-10-CM | POA: Diagnosis not present

## 2017-08-24 ENCOUNTER — Ambulatory Visit (INDEPENDENT_AMBULATORY_CARE_PROVIDER_SITE_OTHER): Payer: Medicare HMO | Admitting: *Deleted

## 2017-08-24 DIAGNOSIS — E1122 Type 2 diabetes mellitus with diabetic chronic kidney disease: Secondary | ICD-10-CM | POA: Diagnosis not present

## 2017-08-24 DIAGNOSIS — I482 Chronic atrial fibrillation: Secondary | ICD-10-CM

## 2017-08-24 DIAGNOSIS — I4821 Permanent atrial fibrillation: Secondary | ICD-10-CM

## 2017-08-24 DIAGNOSIS — I2510 Atherosclerotic heart disease of native coronary artery without angina pectoris: Secondary | ICD-10-CM | POA: Diagnosis not present

## 2017-08-24 DIAGNOSIS — I130 Hypertensive heart and chronic kidney disease with heart failure and stage 1 through stage 4 chronic kidney disease, or unspecified chronic kidney disease: Secondary | ICD-10-CM | POA: Diagnosis not present

## 2017-08-24 DIAGNOSIS — D631 Anemia in chronic kidney disease: Secondary | ICD-10-CM | POA: Diagnosis not present

## 2017-08-24 DIAGNOSIS — N182 Chronic kidney disease, stage 2 (mild): Secondary | ICD-10-CM | POA: Diagnosis not present

## 2017-08-24 DIAGNOSIS — I5032 Chronic diastolic (congestive) heart failure: Secondary | ICD-10-CM | POA: Diagnosis not present

## 2017-08-24 DIAGNOSIS — J189 Pneumonia, unspecified organism: Secondary | ICD-10-CM | POA: Diagnosis not present

## 2017-08-24 DIAGNOSIS — J441 Chronic obstructive pulmonary disease with (acute) exacerbation: Secondary | ICD-10-CM | POA: Diagnosis not present

## 2017-08-24 DIAGNOSIS — J440 Chronic obstructive pulmonary disease with acute lower respiratory infection: Secondary | ICD-10-CM | POA: Diagnosis not present

## 2017-08-24 LAB — POCT INR: INR: 3.2 — AB (ref <=1.1)

## 2017-08-24 NOTE — Progress Notes (Addendum)
Subjective:     Indication: atrial fibrillation Bleeding signs/symptoms: None Thromboembolic signs/symptoms: None  Missed Coumadin doses: None Medication changes: no Dietary changes: no Bacterial/viral infection: no Other concerns: Patient was discharged from the hospital 2 weeks ago. Dx with pneumonia. Due for follow up with PCP. Daughter will call to scheduled. Home health nurse is coming out to see her regularly.   The following portions of the patient's history were reviewed and updated as appropriate: allergies and current medications.  Review of Systems Pertinent items are noted in HPI.   Objective:    INR Today: 3.2 Current dose: 2mg  on Friday and 1mg  all other days    Assessment:    Supratherapeutic INR for goal of 2-3   Plan:    1. New dose: hold today's dose (1mg ) and restart normal dose tomorrow.    2. Next INR: 1 week    Discussed with patient and will leave instructions with home health nurse as well.  Asked patient to have her daughter call and schedule her follow up appointment with [name], FNP  Chong Sicilian, RN

## 2017-08-24 NOTE — Progress Notes (Signed)
VM rcvd today INR 3.2 Pt takes 2 mg Coumadin on Fridays and 1/2 on other days Please advise and return call. Call patient as well.

## 2017-08-25 ENCOUNTER — Encounter: Payer: Self-pay | Admitting: Family

## 2017-08-25 ENCOUNTER — Ambulatory Visit (INDEPENDENT_AMBULATORY_CARE_PROVIDER_SITE_OTHER): Payer: Medicare HMO | Admitting: Family

## 2017-08-25 ENCOUNTER — Ambulatory Visit (INDEPENDENT_AMBULATORY_CARE_PROVIDER_SITE_OTHER): Payer: Medicare HMO

## 2017-08-25 VITALS — BP 130/60 | HR 65 | Temp 98.6°F | Ht 62.0 in | Wt 129.6 lb

## 2017-08-25 DIAGNOSIS — N182 Chronic kidney disease, stage 2 (mild): Secondary | ICD-10-CM | POA: Diagnosis not present

## 2017-08-25 DIAGNOSIS — J189 Pneumonia, unspecified organism: Secondary | ICD-10-CM | POA: Diagnosis not present

## 2017-08-25 DIAGNOSIS — R1032 Left lower quadrant pain: Secondary | ICD-10-CM | POA: Diagnosis not present

## 2017-08-25 DIAGNOSIS — J440 Chronic obstructive pulmonary disease with acute lower respiratory infection: Secondary | ICD-10-CM | POA: Diagnosis not present

## 2017-08-25 DIAGNOSIS — D631 Anemia in chronic kidney disease: Secondary | ICD-10-CM | POA: Diagnosis not present

## 2017-08-25 DIAGNOSIS — J441 Chronic obstructive pulmonary disease with (acute) exacerbation: Secondary | ICD-10-CM | POA: Diagnosis not present

## 2017-08-25 DIAGNOSIS — I5032 Chronic diastolic (congestive) heart failure: Secondary | ICD-10-CM | POA: Diagnosis not present

## 2017-08-25 DIAGNOSIS — K5792 Diverticulitis of intestine, part unspecified, without perforation or abscess without bleeding: Secondary | ICD-10-CM | POA: Diagnosis not present

## 2017-08-25 DIAGNOSIS — E1122 Type 2 diabetes mellitus with diabetic chronic kidney disease: Secondary | ICD-10-CM | POA: Diagnosis not present

## 2017-08-25 DIAGNOSIS — I130 Hypertensive heart and chronic kidney disease with heart failure and stage 1 through stage 4 chronic kidney disease, or unspecified chronic kidney disease: Secondary | ICD-10-CM | POA: Diagnosis not present

## 2017-08-25 DIAGNOSIS — I2510 Atherosclerotic heart disease of native coronary artery without angina pectoris: Secondary | ICD-10-CM | POA: Diagnosis not present

## 2017-08-25 LAB — URINALYSIS
BILIRUBIN UA: NEGATIVE
Glucose, UA: NEGATIVE
NITRITE UA: NEGATIVE
PH UA: 6.5 (ref 5.0–7.5)
Specific Gravity, UA: 1.025 (ref 1.005–1.030)
Urobilinogen, Ur: 0.2 mg/dL (ref 0.2–1.0)

## 2017-08-25 MED ORDER — AMOXICILLIN-POT CLAVULANATE 875-125 MG PO TABS: 1.0000 | ORAL_TABLET | Freq: Three times a day (TID) | ORAL | 0 refills | Status: DC

## 2017-08-25 NOTE — Telephone Encounter (Signed)
Medication has came in from the pharmacy. Will schedule patient for injection.

## 2017-08-25 NOTE — Progress Notes (Signed)
   Subjective:    Patient ID: [name], female    DOB: [birth year], 83 y.o.   MRN: [id number]  Abdominal Pain  This is a recurrent problem. The current episode started yesterday. The onset quality is sudden. The problem occurs intermittently. The problem has been unchanged. The pain is located in the LLQ. The pain is at a severity of 8/10. The pain is moderate. The quality of the pain is aching and sharp. The abdominal pain does not radiate. Associated symptoms include belching and flatus. Pertinent negatives include no constipation, dysuria, frequency, nausea or vomiting. The pain is aggravated by eating. The pain is relieved by nothing. She has tried nothing for the symptoms. The treatment provided no relief.      Review of Systems  Gastrointestinal: Positive for abdominal pain and flatus. Negative for constipation, nausea and vomiting.  Genitourinary: Negative for dysuria and frequency.  All other systems reviewed and are negative.      Objective:   Physical Exam  Constitutional: She is oriented to person, place, and time. She appears well-developed and well-nourished. No distress.  HENT:  Head: Normocephalic.  Eyes: Pupils are equal, round, and reactive to light.  Neck: Normal range of motion. Neck supple. No thyromegaly present.  Cardiovascular: Normal rate, regular rhythm, normal heart sounds and intact distal pulses.  No murmur heard. Pulmonary/Chest: Effort normal and breath sounds normal. No respiratory distress. She has no wheezes.  Abdominal: Soft. Bowel sounds are normal. She exhibits no distension. There is tenderness (mild LLQ).  Musculoskeletal: Normal range of motion. She exhibits no edema or tenderness.  Neurological: She is alert and oriented to person, place, and time.  Skin: Skin is warm and dry.  Psychiatric: She has a normal mood and affect. Her behavior is normal. Judgment and thought content normal.  Vitals reviewed.  KUB- Negative Preliminary reading  by Evelina Dun, FNP [facility] ]   BP 130/60 (BP Location: Left Arm, Patient Position: Sitting, Cuff Size: Normal)   Pulse 65   Temp 98.6 F (37 C) (Oral)   Ht 5\' 2"  (1.575 m)   Wt 129 lb 9.6 oz (58.8 kg)   BMI 23.70 kg/m      Assessment & Plan:  1. LLQ pain - DG Abd 1 View; Future - Urinalysis - Urine Culture  2. Diverticulitis Force fluids If any blood call office or go to ED Stop smoking  Urine culture pending - amoxicillin-clavulanate (AUGMENTIN) 875-125 MG tablet; Take 1 tablet by mouth 3 (three) times daily.  Dispense: 21 tablet; Refill: 0    Evelina Dun, FNP

## 2017-08-25 NOTE — Patient Instructions (Signed)
Diverticulitis °Diverticulitis is infection or inflammation of small pouches (diverticula) in the colon that form due to a condition called diverticulosis. Diverticula can trap stool (feces) and bacteria, causing infection and inflammation. °Diverticulitis may cause severe stomach pain and diarrhea. It may lead to tissue damage in the colon that causes bleeding. The diverticula may also burst (rupture) and cause infected stool to enter other areas of the abdomen. °Complications of diverticulitis can include: °· Bleeding. °· Severe infection. °· Severe pain. °· Rupture (perforation) of the colon. °· Blockage (obstruction) of the colon. ° °What are the causes? °This condition is caused by stool becoming trapped in the diverticula, which allows bacteria to grow in the diverticula. This leads to inflammation and infection. °What increases the risk? °You are more likely to develop this condition if: °· You have diverticulosis. The risk for diverticulosis increases if: °? You are overweight or obese. °? You use tobacco products. °? You do not get enough exercise. °· You eat a diet that does not include enough fiber. High-fiber foods include fruits, vegetables, beans, nuts, and whole grains. ° °What are the signs or symptoms? °Symptoms of this condition may include: °· Pain and tenderness in the abdomen. The pain is normally located on the left side of the abdomen, but it may occur in other areas. °· Fever and chills. °· Bloating. °· Cramping. °· Nausea. °· Vomiting. °· Changes in bowel routines. °· Blood in your stool. ° °How is this diagnosed? °This condition is diagnosed based on: °· Your medical history. °· A physical exam. °· Tests to make sure there is nothing else causing your condition. These tests may include: °? Blood tests. °? Urine tests. °? Imaging tests of the abdomen, including X-rays, ultrasounds, MRIs, or CT scans. ° °How is this treated? °Most cases of this condition are mild and can be treated at home.  Treatment may include: °· Taking over-the-counter pain medicines. °· Following a clear liquid diet. °· Taking antibiotic medicines by mouth. °· Rest. ° °More severe cases may need to be treated at a hospital. Treatment may include: °· Not eating or drinking. °· Taking prescription pain medicine. °· Receiving antibiotic medicines through an IV tube. °· Receiving fluids and nutrition through an IV tube. °· Surgery. ° °When your condition is under control, your health care provider may recommend that you have a colonoscopy. This is an exam to look at the entire large intestine. During the exam, a lubricated, bendable tube is inserted into the anus and then passed into the rectum, colon, and other parts of the large intestine. A colonoscopy can show how severe your diverticula are and whether something else may be causing your symptoms. °Follow these instructions at home: °Medicines °· Take over-the-counter and prescription medicines only as told by your health care provider. These include fiber supplements, probiotics, and stool softeners. °· If you were prescribed an antibiotic medicine, take it as told by your health care provider. Do not stop taking the antibiotic even if you start to feel better. °· Do not drive or use heavy machinery while taking prescription pain medicine. °General instructions °· Follow a full liquid diet or another diet as directed by your health care provider. After your symptoms improve, your health care provider may tell you to change your diet. He or she may recommend that you eat a diet that contains at least 25 g (25 grams) of fiber daily. Fiber makes it easier to pass stool. Healthy sources of fiber include: °? Berries. One cup   contains 4-8 grams of fiber. °? Beans or lentils. One half cup contains 5-8 grams of fiber. °? Green vegetables. One cup contains 4 grams of fiber. °· Exercise for at least 30 minutes, 3 times each week. You should exercise hard enough to raise your heart rate and  break a sweat. °· Keep all follow-up visits as told by your health care provider. This is important. You may need a colonoscopy. °Contact a health care provider if: °· Your pain does not improve. °· You have a hard time drinking or eating food. °· Your bowel movements do not return to normal. °Get help right away if: °· Your pain gets worse. °· Your symptoms do not get better with treatment. °· Your symptoms suddenly get worse. °· You have a fever. °· You vomit more than one time. °· You have stools that are bloody, black, or tarry. °Summary °· Diverticulitis is infection or inflammation of small pouches (diverticula) in the colon that form due to a condition called diverticulosis. Diverticula can trap stool (feces) and bacteria, causing infection and inflammation. °· You are at higher risk for this condition if you have diverticulosis and you eat a diet that does not include enough fiber. °· Most cases of this condition are mild and can be treated at home. More severe cases may need to be treated at a hospital. °· When your condition is under control, your health care provider may recommend that you have an exam called a colonoscopy. This exam can show how severe your diverticula are and whether something else may be causing your symptoms. °This information is not intended to replace advice given to you by your health care provider. Make sure you discuss any questions you have with your health care provider. °Document Released: 05/06/2005 Document Revised: 08/29/2016 Document Reviewed: 08/29/2016 °Elsevier Interactive Patient Education © 2018 [employer] ° °

## 2017-08-26 LAB — CUP PACEART REMOTE DEVICE CHECK
Battery Remaining Percentage: 91 %
Date Time Interrogation Session: 20190109195248
Implantable Lead Implant Date: 20120323
Implantable Pulse Generator Implant Date: 20120323
Lead Channel Impedance Value: 730 Ohm
Lead Channel Pacing Threshold Amplitude: 0.75 V
Lead Channel Pacing Threshold Pulse Width: 0.5 ms
Lead Channel Setting Sensing Sensitivity: 2 mV
MDC IDC LEAD LOCATION: 753860
MDC IDC MSMT BATTERY REMAINING LONGEVITY: 133 mo
MDC IDC MSMT BATTERY VOLTAGE: 2.95 V
MDC IDC MSMT LEADCHNL RV SENSING INTR AMPL: 12 mV
MDC IDC PG SERIAL: 7199163
MDC IDC SET LEADCHNL RV PACING AMPLITUDE: 2.5 V
MDC IDC SET LEADCHNL RV PACING PULSEWIDTH: 0.5 ms
MDC IDC STAT BRADY RV PERCENT PACED: 7.6 %
Pulse Gen Model: 1110

## 2017-08-26 LAB — URINE CULTURE

## 2017-08-26 NOTE — Progress Notes (Signed)
Remote pacemaker transmission.   

## 2017-08-27 ENCOUNTER — Ambulatory Visit: Payer: Medicare HMO | Admitting: Nurse Practitioner

## 2017-08-27 ENCOUNTER — Encounter: Payer: Self-pay | Admitting: Cardiology

## 2017-08-30 ENCOUNTER — Ambulatory Visit (INDEPENDENT_AMBULATORY_CARE_PROVIDER_SITE_OTHER): Payer: Medicare HMO

## 2017-08-30 DIAGNOSIS — J440 Chronic obstructive pulmonary disease with acute lower respiratory infection: Secondary | ICD-10-CM | POA: Diagnosis not present

## 2017-08-30 DIAGNOSIS — J441 Chronic obstructive pulmonary disease with (acute) exacerbation: Secondary | ICD-10-CM

## 2017-08-30 DIAGNOSIS — I2510 Atherosclerotic heart disease of native coronary artery without angina pectoris: Secondary | ICD-10-CM

## 2017-08-30 DIAGNOSIS — J189 Pneumonia, unspecified organism: Secondary | ICD-10-CM | POA: Diagnosis not present

## 2017-08-31 ENCOUNTER — Ambulatory Visit: Payer: Self-pay | Admitting: *Deleted

## 2017-08-31 DIAGNOSIS — J440 Chronic obstructive pulmonary disease with acute lower respiratory infection: Secondary | ICD-10-CM | POA: Diagnosis not present

## 2017-08-31 DIAGNOSIS — D631 Anemia in chronic kidney disease: Secondary | ICD-10-CM | POA: Diagnosis not present

## 2017-08-31 DIAGNOSIS — I2510 Atherosclerotic heart disease of native coronary artery without angina pectoris: Secondary | ICD-10-CM | POA: Diagnosis not present

## 2017-08-31 DIAGNOSIS — I130 Hypertensive heart and chronic kidney disease with heart failure and stage 1 through stage 4 chronic kidney disease, or unspecified chronic kidney disease: Secondary | ICD-10-CM | POA: Diagnosis not present

## 2017-08-31 DIAGNOSIS — I5032 Chronic diastolic (congestive) heart failure: Secondary | ICD-10-CM | POA: Diagnosis not present

## 2017-08-31 DIAGNOSIS — I4821 Permanent atrial fibrillation: Secondary | ICD-10-CM

## 2017-08-31 DIAGNOSIS — J189 Pneumonia, unspecified organism: Secondary | ICD-10-CM | POA: Diagnosis not present

## 2017-08-31 DIAGNOSIS — N182 Chronic kidney disease, stage 2 (mild): Secondary | ICD-10-CM | POA: Diagnosis not present

## 2017-08-31 DIAGNOSIS — E1122 Type 2 diabetes mellitus with diabetic chronic kidney disease: Secondary | ICD-10-CM | POA: Diagnosis not present

## 2017-08-31 DIAGNOSIS — J441 Chronic obstructive pulmonary disease with (acute) exacerbation: Secondary | ICD-10-CM | POA: Diagnosis not present

## 2017-08-31 NOTE — Progress Notes (Signed)
VM from [facility] INR 2.2 Last INR was done 08/24/17 Coumadin was held then restarted 2.5 mg on Friday then 1 mg all other days

## 2017-09-01 DIAGNOSIS — M5441 Lumbago with sciatica, right side: Secondary | ICD-10-CM | POA: Diagnosis not present

## 2017-09-01 DIAGNOSIS — Z79899 Other long term (current) drug therapy: Secondary | ICD-10-CM | POA: Diagnosis not present

## 2017-09-01 DIAGNOSIS — G8929 Other chronic pain: Secondary | ICD-10-CM | POA: Diagnosis not present

## 2017-09-01 DIAGNOSIS — M75102 Unspecified rotator cuff tear or rupture of left shoulder, not specified as traumatic: Secondary | ICD-10-CM | POA: Diagnosis not present

## 2017-09-02 DIAGNOSIS — J440 Chronic obstructive pulmonary disease with acute lower respiratory infection: Secondary | ICD-10-CM | POA: Diagnosis not present

## 2017-09-02 DIAGNOSIS — N182 Chronic kidney disease, stage 2 (mild): Secondary | ICD-10-CM | POA: Diagnosis not present

## 2017-09-02 DIAGNOSIS — J441 Chronic obstructive pulmonary disease with (acute) exacerbation: Secondary | ICD-10-CM | POA: Diagnosis not present

## 2017-09-02 DIAGNOSIS — I5032 Chronic diastolic (congestive) heart failure: Secondary | ICD-10-CM | POA: Diagnosis not present

## 2017-09-02 DIAGNOSIS — E1122 Type 2 diabetes mellitus with diabetic chronic kidney disease: Secondary | ICD-10-CM | POA: Diagnosis not present

## 2017-09-02 DIAGNOSIS — I2510 Atherosclerotic heart disease of native coronary artery without angina pectoris: Secondary | ICD-10-CM | POA: Diagnosis not present

## 2017-09-02 DIAGNOSIS — I130 Hypertensive heart and chronic kidney disease with heart failure and stage 1 through stage 4 chronic kidney disease, or unspecified chronic kidney disease: Secondary | ICD-10-CM | POA: Diagnosis not present

## 2017-09-02 DIAGNOSIS — J189 Pneumonia, unspecified organism: Secondary | ICD-10-CM | POA: Diagnosis not present

## 2017-09-02 DIAGNOSIS — D631 Anemia in chronic kidney disease: Secondary | ICD-10-CM | POA: Diagnosis not present

## 2017-09-06 DIAGNOSIS — J440 Chronic obstructive pulmonary disease with acute lower respiratory infection: Secondary | ICD-10-CM | POA: Diagnosis not present

## 2017-09-06 DIAGNOSIS — D631 Anemia in chronic kidney disease: Secondary | ICD-10-CM | POA: Diagnosis not present

## 2017-09-06 DIAGNOSIS — I130 Hypertensive heart and chronic kidney disease with heart failure and stage 1 through stage 4 chronic kidney disease, or unspecified chronic kidney disease: Secondary | ICD-10-CM | POA: Diagnosis not present

## 2017-09-06 DIAGNOSIS — E1122 Type 2 diabetes mellitus with diabetic chronic kidney disease: Secondary | ICD-10-CM | POA: Diagnosis not present

## 2017-09-06 DIAGNOSIS — J189 Pneumonia, unspecified organism: Secondary | ICD-10-CM | POA: Diagnosis not present

## 2017-09-06 DIAGNOSIS — I5032 Chronic diastolic (congestive) heart failure: Secondary | ICD-10-CM | POA: Diagnosis not present

## 2017-09-06 DIAGNOSIS — I2510 Atherosclerotic heart disease of native coronary artery without angina pectoris: Secondary | ICD-10-CM | POA: Diagnosis not present

## 2017-09-06 DIAGNOSIS — N182 Chronic kidney disease, stage 2 (mild): Secondary | ICD-10-CM | POA: Diagnosis not present

## 2017-09-06 DIAGNOSIS — J441 Chronic obstructive pulmonary disease with (acute) exacerbation: Secondary | ICD-10-CM | POA: Diagnosis not present

## 2017-09-10 ENCOUNTER — Other Ambulatory Visit: Payer: Self-pay | Admitting: Nurse Practitioner

## 2017-09-10 ENCOUNTER — Ambulatory Visit: Payer: Medicare HMO

## 2017-09-10 DIAGNOSIS — F419 Anxiety disorder, unspecified: Secondary | ICD-10-CM

## 2017-09-10 NOTE — Telephone Encounter (Signed)
Last filled 08/12/17

## 2017-09-12 DIAGNOSIS — N2889 Other specified disorders of kidney and ureter: Secondary | ICD-10-CM | POA: Diagnosis not present

## 2017-09-12 DIAGNOSIS — Z79899 Other long term (current) drug therapy: Secondary | ICD-10-CM | POA: Diagnosis not present

## 2017-09-12 DIAGNOSIS — R1032 Left lower quadrant pain: Secondary | ICD-10-CM | POA: Diagnosis not present

## 2017-09-12 DIAGNOSIS — I428 Other cardiomyopathies: Secondary | ICD-10-CM | POA: Diagnosis not present

## 2017-09-12 DIAGNOSIS — Z720 Tobacco use: Secondary | ICD-10-CM | POA: Diagnosis not present

## 2017-09-12 DIAGNOSIS — I509 Heart failure, unspecified: Secondary | ICD-10-CM | POA: Diagnosis not present

## 2017-09-12 DIAGNOSIS — E7800 Pure hypercholesterolemia, unspecified: Secondary | ICD-10-CM | POA: Diagnosis not present

## 2017-09-12 DIAGNOSIS — K219 Gastro-esophageal reflux disease without esophagitis: Secondary | ICD-10-CM | POA: Diagnosis not present

## 2017-09-12 DIAGNOSIS — N390 Urinary tract infection, site not specified: Secondary | ICD-10-CM | POA: Diagnosis not present

## 2017-09-12 DIAGNOSIS — J441 Chronic obstructive pulmonary disease with (acute) exacerbation: Secondary | ICD-10-CM | POA: Diagnosis not present

## 2017-09-12 DIAGNOSIS — J188 Other pneumonia, unspecified organism: Secondary | ICD-10-CM | POA: Diagnosis not present

## 2017-09-12 DIAGNOSIS — R0602 Shortness of breath: Secondary | ICD-10-CM | POA: Diagnosis not present

## 2017-09-12 DIAGNOSIS — J449 Chronic obstructive pulmonary disease, unspecified: Secondary | ICD-10-CM | POA: Diagnosis not present

## 2017-09-12 DIAGNOSIS — E119 Type 2 diabetes mellitus without complications: Secondary | ICD-10-CM | POA: Diagnosis not present

## 2017-09-13 ENCOUNTER — Ambulatory Visit (INDEPENDENT_AMBULATORY_CARE_PROVIDER_SITE_OTHER): Payer: Medicare HMO | Admitting: Nurse Practitioner

## 2017-09-13 DIAGNOSIS — I482 Chronic atrial fibrillation: Secondary | ICD-10-CM

## 2017-09-13 DIAGNOSIS — M810 Age-related osteoporosis without current pathological fracture: Secondary | ICD-10-CM | POA: Diagnosis not present

## 2017-09-13 DIAGNOSIS — I4821 Permanent atrial fibrillation: Secondary | ICD-10-CM

## 2017-09-13 LAB — COAGUCHEK XS/INR WAIVED
INR: 3.2 — AB (ref 0.9–1.1)
Prothrombin Time: 38.1 s

## 2017-09-13 MED ORDER — DENOSUMAB 60 MG/ML ~~LOC~~ SOLN
60.0000 mg | Freq: Once | SUBCUTANEOUS | Status: AC
  Administered 2017-09-13: 60 mg via SUBCUTANEOUS

## 2017-09-13 NOTE — Progress Notes (Signed)
Pt given Prolia inj Tolerated well Subjective:    [name] is a 83 y.o. female here for follow-up of chronic anticoagulation for atrial fibrillation. Bleeding signs/symptoms:  None Thromboembolic signs/symptoms:  None  Missed Coumadin doses: None Medication changes: no Dietary changes: no Bacterial/viral infection: yes - Augmentin Other concerns: no  The following portions of the patient's history were reviewed and updated as appropriate: allergies and current medications.   Review of Systems Pertinent items are noted in HPI.   Objective:    Current warfarin (COUMADIN) dose: 2mg  on Friday, 1mg  all other days Lab Results  Component Value Date   INR 3.2 (A) 08/24/2017   INR 2.92 08/09/2017   INR 2.46 08/08/2017   INR 2.0 (H) 06/09/2017   INR 2.0 (H) 04/23/2017   INR 2.1 (H) 03/23/2017   INR 2.6 09/06/2011      Assessment:    Supratherapeutic INR for goal of 2-3    Plan:    Dose: 1mg  daily Next INR: 2 weeks     Discussed with [place]

## 2017-09-24 ENCOUNTER — Encounter: Payer: Medicare HMO | Admitting: Pharmacist Clinician (PhC)/ Clinical Pharmacy Specialist

## 2017-09-27 ENCOUNTER — Other Ambulatory Visit: Payer: Self-pay | Admitting: Nurse Practitioner

## 2017-09-27 DIAGNOSIS — Z720 Tobacco use: Secondary | ICD-10-CM

## 2017-09-27 DIAGNOSIS — E785 Hyperlipidemia, unspecified: Secondary | ICD-10-CM

## 2017-10-05 ENCOUNTER — Ambulatory Visit: Payer: Medicare HMO | Admitting: Nurse Practitioner

## 2017-10-06 ENCOUNTER — Other Ambulatory Visit: Payer: Self-pay | Admitting: Nurse Practitioner

## 2017-10-06 ENCOUNTER — Other Ambulatory Visit: Payer: Self-pay

## 2017-10-06 DIAGNOSIS — Z950 Presence of cardiac pacemaker: Secondary | ICD-10-CM

## 2017-10-06 MED ORDER — WARFARIN SODIUM 2 MG PO TABS: ORAL_TABLET | ORAL | 0 refills | Status: DC

## 2017-10-07 DIAGNOSIS — M67911 Unspecified disorder of synovium and tendon, right shoulder: Secondary | ICD-10-CM | POA: Diagnosis not present

## 2017-10-07 DIAGNOSIS — Z79899 Other long term (current) drug therapy: Secondary | ICD-10-CM | POA: Diagnosis not present

## 2017-10-07 DIAGNOSIS — R1031 Right lower quadrant pain: Secondary | ICD-10-CM | POA: Diagnosis not present

## 2017-10-10 DIAGNOSIS — J449 Chronic obstructive pulmonary disease, unspecified: Secondary | ICD-10-CM | POA: Diagnosis not present

## 2017-10-10 DIAGNOSIS — J441 Chronic obstructive pulmonary disease with (acute) exacerbation: Secondary | ICD-10-CM | POA: Diagnosis not present

## 2017-10-10 DIAGNOSIS — J188 Other pneumonia, unspecified organism: Secondary | ICD-10-CM | POA: Diagnosis not present

## 2017-10-12 ENCOUNTER — Ambulatory Visit (INDEPENDENT_AMBULATORY_CARE_PROVIDER_SITE_OTHER): Payer: Medicare HMO | Admitting: Nurse Practitioner

## 2017-10-12 ENCOUNTER — Encounter: Payer: Self-pay | Admitting: Nurse Practitioner

## 2017-10-12 VITALS — BP 93/47 | HR 66 | Temp 98.7°F | Ht 62.0 in | Wt 128.2 lb

## 2017-10-12 DIAGNOSIS — I482 Chronic atrial fibrillation: Secondary | ICD-10-CM | POA: Diagnosis not present

## 2017-10-12 DIAGNOSIS — I4821 Permanent atrial fibrillation: Secondary | ICD-10-CM

## 2017-10-12 LAB — COAGUCHEK XS/INR WAIVED
INR: 3 — ABNORMAL HIGH (ref 0.9–1.1)
PROTHROMBIN TIME: 36.4 s

## 2017-10-12 NOTE — Progress Notes (Signed)
Subjective:    Patient in today for INR only   Indication: atrial fibrillation and coronary artery disease Bleeding signs/symptoms: None Thromboembolic signs/symptoms: None  Missed Coumadin doses: None Medication changes: no Dietary changes: no Bacterial/viral infection: no Other concerns: no  The following portions of the patient's history were reviewed and updated as appropriate: allergies, current medications, past family history, past medical history, past social history, past surgical history and problem list.  Review of Systems Pertinent items noted in HPI and remainder of comprehensive ROS otherwise negative.   Objective:    INR Today: 3.0 Current dose: coumadin 1mg  daily    Assessment:    Therapeutic INR for goal of 2-3   Plan:    1. New dose: no change   2. Next INR: 1 month   * need to make appointment for complete follow up in 1 month  [place], FNP

## 2017-10-13 ENCOUNTER — Other Ambulatory Visit: Payer: Self-pay | Admitting: Nurse Practitioner

## 2017-10-19 DIAGNOSIS — F17200 Nicotine dependence, unspecified, uncomplicated: Secondary | ICD-10-CM | POA: Diagnosis not present

## 2017-10-19 DIAGNOSIS — I509 Heart failure, unspecified: Secondary | ICD-10-CM | POA: Diagnosis not present

## 2017-10-19 DIAGNOSIS — J449 Chronic obstructive pulmonary disease, unspecified: Secondary | ICD-10-CM | POA: Diagnosis not present

## 2017-10-19 DIAGNOSIS — R51 Headache: Secondary | ICD-10-CM | POA: Diagnosis not present

## 2017-10-19 DIAGNOSIS — E7800 Pure hypercholesterolemia, unspecified: Secondary | ICD-10-CM | POA: Diagnosis not present

## 2017-10-19 DIAGNOSIS — E119 Type 2 diabetes mellitus without complications: Secondary | ICD-10-CM | POA: Diagnosis not present

## 2017-10-19 DIAGNOSIS — M1990 Unspecified osteoarthritis, unspecified site: Secondary | ICD-10-CM | POA: Diagnosis not present

## 2017-10-19 DIAGNOSIS — Z79899 Other long term (current) drug therapy: Secondary | ICD-10-CM | POA: Diagnosis not present

## 2017-10-19 DIAGNOSIS — K219 Gastro-esophageal reflux disease without esophagitis: Secondary | ICD-10-CM | POA: Diagnosis not present

## 2017-10-22 DIAGNOSIS — R51 Headache: Secondary | ICD-10-CM | POA: Diagnosis not present

## 2017-10-25 ENCOUNTER — Other Ambulatory Visit: Payer: Self-pay | Admitting: Cardiovascular Disease

## 2017-10-28 DIAGNOSIS — B351 Tinea unguium: Secondary | ICD-10-CM | POA: Diagnosis not present

## 2017-10-28 DIAGNOSIS — M79676 Pain in unspecified toe(s): Secondary | ICD-10-CM | POA: Diagnosis not present

## 2017-10-28 DIAGNOSIS — L84 Corns and callosities: Secondary | ICD-10-CM | POA: Diagnosis not present

## 2017-10-28 DIAGNOSIS — E1142 Type 2 diabetes mellitus with diabetic polyneuropathy: Secondary | ICD-10-CM | POA: Diagnosis not present

## 2017-11-03 ENCOUNTER — Ambulatory Visit: Payer: Self-pay | Admitting: *Deleted

## 2017-11-04 DIAGNOSIS — Z79899 Other long term (current) drug therapy: Secondary | ICD-10-CM | POA: Diagnosis not present

## 2017-11-04 DIAGNOSIS — M67911 Unspecified disorder of synovium and tendon, right shoulder: Secondary | ICD-10-CM | POA: Diagnosis not present

## 2017-11-05 ENCOUNTER — Other Ambulatory Visit: Payer: Self-pay | Admitting: Family

## 2017-11-05 DIAGNOSIS — F419 Anxiety disorder, unspecified: Secondary | ICD-10-CM

## 2017-11-05 NOTE — Telephone Encounter (Signed)
Last seen 10/12/17  MMM

## 2017-11-08 ENCOUNTER — Ambulatory Visit (INDEPENDENT_AMBULATORY_CARE_PROVIDER_SITE_OTHER): Payer: Medicare HMO | Admitting: *Deleted

## 2017-11-08 ENCOUNTER — Encounter: Payer: Self-pay | Admitting: *Deleted

## 2017-11-08 VITALS — BP 123/74 | HR 64 | Ht 60.5 in | Wt 127.0 lb

## 2017-11-08 DIAGNOSIS — Z0000 Encounter for general adult medical examination without abnormal findings: Secondary | ICD-10-CM

## 2017-11-08 NOTE — Progress Notes (Addendum)
Subjective:   [name] is a 83 y.o. female who presents for an Initial Medicare Annual Wellness Visit. Ms Crothers has one daughter, one son, 5 grandchildren, and 42 great grandchildren. She lives at home alone with her small dog, but her daughter lives within close walking distance to her. She uses a cane and a walker, at times, to help with her balance.   Review of Systems    Health is about the same as last year.   Cardiac Risk Factors include: advanced age (>66men, >81 women);diabetes mellitus;dyslipidemia;smoking/ tobacco exposure;hypertension;sedentary lifestyle;family history of premature cardiovascular disease  GI: frequent diarrhea and right lower quadrant pain x  6 months. Has been evaluated but cause has not been found. She is expecting an appointment with a gastroenterologist. She denies any blood in her stool or black tarry stools. No N/V. Her abdominal pain is sometimes worse after eating.     Objective:    Today's Vitals   11/08/17 1511  BP: 123/74  Pulse: 64  Weight: 127 lb (57.6 kg)  Height: 5' 0.5" (1.537 m)  PainSc: 7    Body mass index is 24.39 kg/m.  Advanced Directives 08/07/2017 11/02/2016 07/30/2016 07/29/2016 11/25/2015 11/01/2015 10/12/2014  Does Patient Have a Medical Advance Directive? No No No No No No No  Would patient like information on creating a medical advance directive? No - Patient declined No - Patient declined No - Patient declined No - Patient declined No - patient declined information No - patient declined information Yes - Educational materials given  Pre-existing out of facility DNR order (yellow form or pink MOST form) - - - - - - -    Current Medications (verified) Outpatient Encounter Medications as of 11/08/2017  Medication Sig  . acetaminophen (TYLENOL) 500 MG tablet Take 1,000 mg by mouth every 6 (six) hours as needed for mild pain, moderate pain or fever.   . Blood Glucose Calibration (TRUE METRIX LEVEL 1) LOW SOLN Use  weekly  . Blood Glucose Monitoring Suppl (TRUE METRIX AIR GLUCOSE METER) DEVI 1 Device by Does not apply route 2 (two) times daily. Use to Test blood sugar bid. DX E13.10  . calcium-vitamin D 250-100 MG-UNIT tablet Take 2 tablets by mouth daily.  . clonazePAM (KLONOPIN) 0.5 MG tablet TAKE ONE (1) TABLET THREE (3) TIMES EACH DAY  . CREON 36000 units CPEP capsule TAKE 3 CAPSULES WITH MEALS  AND TAKE 1 CAPSULE WITH A SNACK AS DIRECTED  . denosumab (PROLIA) 60 MG/ML SOLN injection INJECT 60MG  (1ML) SUBCUTANEOUSLY EVERY SIX MONTHS  . diltiazem (CARDIZEM) 120 MG tablet TAKE 1 TABLET EVERY DAY  . ferrous sulfate 325 (65 FE) MG tablet Take 1 tablet (325 mg total) by mouth daily with breakfast.  . furosemide (LASIX) 40 MG tablet TAKE 1 TABLET EVERY DAY  . glucose blood (TRUE METRIX BLOOD GLUCOSE TEST) test strip TEST TWO TIMES DAILY AS DIRECTED  . guaiFENesin (MUCINEX) 600 MG 12 hr tablet Take 1 tablet (600 mg total) by mouth 2 (two) times daily.  [name] HYDROcodone-acetaminophen (NORCO) 7.5-325 MG tablet Take 1 tablet by mouth 3 (three) times daily as needed (pain).   [name] ipratropium-albuterol (DUONEB) 0.5-2.5 (3) MG/3ML SOLN INHALE THE CONTENTS OF 1 VIAL VIA NEBULIZER EVERY 4 HOURS AS NEEDED  . JANUVIA 50 MG tablet TAKE 1 TABLET EVERY DAY  . nitroGLYCERIN (NITROSTAT) 0.4 MG SL tablet Place 1 tablet (0.4 mg total) under the tongue every 5 (five) minutes as needed for chest pain.  [name]  OXYGEN Inhale 2 L into the lungs at bedtime. 2lpm with sleep only   . pantoprazole (PROTONIX) 40 MG tablet TAKE 1 TABLET TWICE DAILY  . potassium chloride SA (K-DUR,KLOR-CON) 20 MEQ tablet Take 1 tablet (20 mEq total) by mouth every morning.  . predniSONE (DELTASONE) 10 MG tablet Take 40mg  po daily for 2 days then 30mg  daily for 2 days then 20mg  daily for 2 days then 10mg  daily for 2 days then stop  . Probiotic Product ([place]) CAPS Take 1 capsule by mouth daily.  . simvastatin (ZOCOR) 10 MG tablet TAKE 1 TABLET EVERY  DAY  . SYMBICORT 160-4.5 MCG/ACT inhaler INHALE 2 PUFFS TWICE DAILY  . TRUEPLUS LANCETS 28G MISC TEST BLOOD SUGAR TWICE DAILY  . VENTOLIN HFA 108 (90 Base) MCG/ACT inhaler USE 2 PUFFS EVERY 6 HOURS AS NEEDED  . warfarin (COUMADIN) 2 MG tablet TAKE PER PHYSICIAN INSTRUCTIONS   No facility-administered encounter medications on file as of 11/08/2017.     Allergies (verified) Macrobid [nitrofurantoin monohyd macro]; Torsemide; and Tramadol   History: Past Medical History:  Diagnosis Date  . Abnormal CT of the chest    Mediastinal and hilar adenopathy followed by Dr. Koleen Nimrod in [place]  . Anemia, unspecified   . Body mass index (BMI) of 20.0-20.9 in adult FEB 2013 147 LBS  . CAD (coronary artery disease) 2011   Catheterization August 2011: mild nonobstructive coronary artery disease complicated by large left rectus sheath hematoma status post evacuation Coumadin restarted without recurrence  . Carotid artery disease ([place])    Doppler, 05/2012, 0-39% bilateral  . Cataract   . Chronic airway obstruction, not elsewhere classified   . Chronic kidney disease, stage II (mild)   . Congestive heart failure, unspecified    EF now 60%, previous nonischemic cardiomyopathy  . Diabetes mellitus   . Dilated bile duct 10/19/2011  . Diverticulitis Dec 2012  . Gastritis    By EGD  . GERD (gastroesophageal reflux disease)   . Hypercholesterolemia   . Hypertension   . Mitral regurgitation 06/27/2013  . Neurogenic sleep apnea    Uses noctural oxygen prescribed by her pulmonologi  . Pacemaker    Single chamber [place]. Jude ACCENT [id number] SN 719 04/[birth year]/1959 10/31/2010  . Permanent atrial fibrillation (HCC)    H/o tachybradycardia syndrome on Coumadin anticoagulation, has pacemaker.  . Recurrent UTI   . Rheumatic heart disease   . Tachycardia-bradycardia [facility])    s/p St. Jude single chamber PPM 10/2010   . Unspecified hypertensive kidney disease with chronic kidney disease stage I through stage IV, or  unspecified(403.90)   . Valvular heart disease    a. H/o moderate mitral stenosis by cath 2011 but no mention of this on 10/2011 echo. b. 10/2011 echo: mod MR, mild AS, mild TR; EF>65%  . Vitamin D deficiency    Past Surgical History:  Procedure Laterality Date  . CHOLECYSTECTOMY     40+ years ago  . COLONOSCOPY  10/2011   Dyann Ruddle sigmoid to descending diverticulosis, internal hemorrhoids  . ESOPHAGOGASTRODUODENOSCOPY  10/2011   Fields-erosive gastritis, biopsy with no H. pylori, hiatal hernia, peptic duodenitis, no celiac disease, duodenal diverticulum, duodenal nodule  . EUS  11/16/11   Mishra-13 MM CBD, normal pancreatic duct, otherwise normal EUS  . Evacuation of retroperitoneal and left rectus sheath    . HEMATOMA EVACUATION     femoral  . HERNIA REPAIR     X3  . PILONIDAL CYST / SINUS EXCISION    .  Single-chamber pacemaker implantation  3/12   by Dr Caryl Comes   Family History  Problem Relation Age of Onset  . Cancer Mother        cervical  . Stroke Mother   . Cancer Daughter 33       kidney, lung  . Heart disease Father   . Heart attack Father   . Ulcers Father   . Early death Sister 1       died at 10 months old  . Hypertension Brother   . Cancer Paternal Aunt        breast  . Hypertension Son   . Colon cancer Neg Hx    Social History   Socioeconomic History  . Marital status: Single    Spouse name: Not on file  . Number of children: 2  . Years of education: 8  . Highest education level: 8th grade  Occupational History  . Occupation: RETIRED    Employer: RETIRED    Comment: [employer]  Social Needs  . Financial resource strain: Not hard at all  . Food insecurity:    Worry: Never true    Inability: Never true  . Transportation needs:    Medical: No    Non-medical: No  Tobacco Use  . Smoking status: Current Every Day Smoker    Packs/day: 0.25    Years: 45.00    Pack years: 11.25    Types: Cigarettes    Start date: 02/05/1958  . Smokeless tobacco:  Never Used  . Tobacco comment: smokes 5-6 cigarettes daily  Substance and Sexual Activity  . Alcohol use: No    Alcohol/week: 0.0 oz  . Drug use: No  . Sexual activity: Not Currently  Lifestyle  . Physical activity:    Days per week: 0 days    Minutes per session: 0 min  . Stress: To some extent  Relationships  . Social connections:    Talks on phone: More than three times a week    Gets together: More than three times a week    Attends religious service: Never    Active member of club or organization: No    Attends meetings of clubs or organizations: Never    Relationship status: Never married  Other Topics Concern  . Not on file  Social History Narrative  . Not on file    Tobacco Counseling Ready to quit: Not Answered Counseling given: Not Answered Comment: smokes 5-6 cigarettes daily Not interested in smoking cessation at this time  Clinical Intake:    Pain : 0-10 Pain Score: 7  Pain Type: Chronic pain Pain Location: Abdomen Pain Orientation: Right, Lower Pain Descriptors / Indicators: Aching Pain Onset: More than a month ago(6 months or more) Pain Frequency: Constant Effect of Pain on Daily Activities: moderate    Nutritional Status: BMI of 19-24  Normal Diabetes: Yes CBG done?: No Did pt. bring in CBG monitor from home?: No  How often do you need to have someone help you when you read instructions, pamphlets, or other written materials from your doctor or pharmacy?: 1 - Never What is the last grade level you completed in school?: 8th  Interpreter Needed?: No  Information entered by :: Chong Sicilian, RN   Activities of Daily Living In your present state of health, do you have any difficulty performing the following activities: 11/08/2017 08/07/2017  Hearing? N N  Vision? Y N  Comment Needs to schedule exam -  Difficulty concentrating or making decisions? N N  Walking or climbing stairs? N Y  Dressing or bathing? N Y  Doing errands, shopping? N Y    Conservation officer, nature and eating ? N -  Using the Toilet? N -  In the past six months, have you accidently leaked urine? N -  Do you have problems with loss of bowel control? N -  Managing your Medications? N -  Managing your Finances? N -  Housekeeping or managing your Housekeeping? N -  Some recent data might be hidden     Immunizations and Health Maintenance Immunization History  Administered Date(s) Administered  . Influenza Split 05/12/2012  . Influenza, High Dose Seasonal PF 06/01/2016  . Influenza,inj,Quad PF,6+ Mos 05/11/2013, 06/21/2014, 05/23/2015, 05/25/2017  . Pneumococcal Conjugate-13 06/21/2014  . Pneumococcal Polysaccharide-23 06/30/1995, 10/04/2015  . Tdap 11/01/2015  . Zoster 10/12/2014   Health Maintenance Due  Topic Date Due  . OPHTHALMOLOGY EXAM  03/23/2014  . FOOT EXAM  09/14/2017    Patient Care Team: [name], [place] as PCP - General (Nurse Practitioner) Daneil Dolin, MD (Gastroenterology) Thompson Grayer, MD as Consulting Physician (Cardiology) Steffanie [place], DPM as Consulting Physician (Podiatry) Danie Binder, MD as Consulting Physician (Gastroenterology) Herminio Commons, MD as Attending Physician (Cardiology) Okey Regal, OD as Consulting Physician (Optometry)  Ed to hospital admission 08/07/17 for pneumonia. No other admission, ER visits, or surgeries this past year.      Assessment:   This is a routine wellness examination for [place].  Hearing/Vision screen No deficits noted during visit.  Dietary issues and exercise activities discussed:  Diet Eats two meals and a snack at lunch Eats out some and cooks some.  Caffeine free Dr Yevonne Pax cans a day and no water  Current Exercise Habits: The patient does not participate in regular exercise at present, Exercise limited by: None identified  Goals    . DIET - INCREASE WATER INTAKE    . Exercise 150 min/wk Moderate Activity      Depression Screen PHQ 2/9 Scores 10/12/2017  07/13/2017 04/20/2017 03/23/2017 03/09/2017 12/14/2016 11/02/2016  PHQ - 2 Score 0 0 0 0 4 0 0  PHQ- 9 Score - - - - 4 - 5    Fall Risk Fall Risk  10/12/2017 07/13/2017 04/20/2017 03/23/2017 12/14/2016  Falls in the past year? No No No No Yes  Number falls in past yr: - - - - 2 or more  Comment - - - - -  Injury with Fall? - - - - No  Comment - - - - -  Risk Factor Category  - - - - -  Risk for fall due to : - - - - -  Follow up - - - - -    Cognitive Function: MMSE - Mini Mental State Exam 11/08/2017 11/02/2016 11/01/2015 11/01/2015 10/12/2014  Orientation to time 5 5 5  - 5  Orientation to Place 5 5 5  - 5  Registration 3 3 3 3 3   Attention/ Calculation 4 5 5  - 3  Recall 3 2 2 2 2   Language- name 2 objects 2 2 2  - 2  Language- repeat 1 1 1  - 1  Language- follow 3 step command 3 1 1  - 1  Language- read & follow direction 1 1 1  - 1  Write a sentence 1 1 1  - 1  Copy design 0 1 0 - 1  Total score 28 27 26  - 25        Screening Tests Health Maintenance  Topic Date  Due  . OPHTHALMOLOGY EXAM  03/23/2014  . FOOT EXAM  09/14/2017  . DEXA SCAN  12/04/2017  . HEMOGLOBIN A1C  02/05/2018  . INFLUENZA VACCINE  03/10/2018  . TETANUS/TDAP  10/31/2025  . PNA vac Low Risk Adult  Completed       Plan:   Move carefully to avoid falls. Watch your dog so that you don't trip on him.  Keep f/u with PCP Increase water intake.  Aim for 150 minutes of moderate activity a week.   I have personally reviewed and noted the following in the patient's chart:   . Medical and social history . Use of alcohol, tobacco or illicit drugs  . Current medications and supplements . Functional ability and status . Nutritional status . Physical activity . Advanced directives . List of other physicians . Hospitalizations, surgeries, and ER visits in previous 12 months . Vitals . Screenings to include cognitive, depression, and falls . Referrals and appointments  In addition, I have reviewed and discussed with  patient certain preventive protocols, quality metrics, and best practice recommendations. A written personalized care plan for preventive services as well as general preventive health recommendations were provided to patient.     Chong Sicilian, RN  11/08/17    Dene Gentry, FNP-C with [facility] has recommended a GI in [facility]. She has not heard anything about an appointment though and would like to follow up on that [phone], f [phone]  I have reviewed and agree with the above AWV documentation. Referral made to gi at appointment  [place], [place]

## 2017-11-08 NOTE — Patient Instructions (Signed)
  Ms. Jr , Thank you for taking time to come for your Medicare Wellness Visit. I appreciate your ongoing commitment to your health goals. Please review the following plan we discussed and let me know if I can assist you in the future.   These are the goals we discussed: Goals    . DIET - INCREASE WATER INTAKE    . Exercise 150 min/wk Moderate Activity       This is a list of the screening recommended for you and due dates:  Health Maintenance  Topic Date Due  . Eye exam for diabetics  03/23/2014  . Complete foot exam   09/14/2017  . DEXA scan (bone density measurement)  12/04/2017  . Hemoglobin A1C  02/05/2018  . Flu Shot  03/10/2018  . Tetanus Vaccine  10/31/2025  . Pneumonia vaccines  Completed

## 2017-11-10 DIAGNOSIS — J188 Other pneumonia, unspecified organism: Secondary | ICD-10-CM | POA: Diagnosis not present

## 2017-11-10 DIAGNOSIS — J449 Chronic obstructive pulmonary disease, unspecified: Secondary | ICD-10-CM | POA: Diagnosis not present

## 2017-11-10 DIAGNOSIS — J441 Chronic obstructive pulmonary disease with (acute) exacerbation: Secondary | ICD-10-CM | POA: Diagnosis not present

## 2017-11-11 ENCOUNTER — Ambulatory Visit (INDEPENDENT_AMBULATORY_CARE_PROVIDER_SITE_OTHER): Payer: Medicare HMO | Admitting: Nurse Practitioner

## 2017-11-11 ENCOUNTER — Encounter: Payer: Self-pay | Admitting: Nurse Practitioner

## 2017-11-11 VITALS — BP 110/59 | HR 60 | Temp 98.2°F | Ht 60.0 in | Wt 117.0 lb

## 2017-11-11 DIAGNOSIS — I050 Rheumatic mitral stenosis: Secondary | ICD-10-CM | POA: Diagnosis not present

## 2017-11-11 DIAGNOSIS — I10 Essential (primary) hypertension: Secondary | ICD-10-CM

## 2017-11-11 DIAGNOSIS — K219 Gastro-esophageal reflux disease without esophagitis: Secondary | ICD-10-CM | POA: Diagnosis not present

## 2017-11-11 DIAGNOSIS — E1121 Type 2 diabetes mellitus with diabetic nephropathy: Secondary | ICD-10-CM | POA: Diagnosis not present

## 2017-11-11 DIAGNOSIS — I4821 Permanent atrial fibrillation: Secondary | ICD-10-CM

## 2017-11-11 DIAGNOSIS — I482 Chronic atrial fibrillation: Secondary | ICD-10-CM

## 2017-11-11 DIAGNOSIS — M810 Age-related osteoporosis without current pathological fracture: Secondary | ICD-10-CM

## 2017-11-11 DIAGNOSIS — E785 Hyperlipidemia, unspecified: Secondary | ICD-10-CM | POA: Diagnosis not present

## 2017-11-11 DIAGNOSIS — J449 Chronic obstructive pulmonary disease, unspecified: Secondary | ICD-10-CM | POA: Diagnosis not present

## 2017-11-11 DIAGNOSIS — I350 Nonrheumatic aortic (valve) stenosis: Secondary | ICD-10-CM | POA: Diagnosis not present

## 2017-11-11 DIAGNOSIS — E876 Hypokalemia: Secondary | ICD-10-CM

## 2017-11-11 DIAGNOSIS — R1031 Right lower quadrant pain: Secondary | ICD-10-CM

## 2017-11-11 DIAGNOSIS — I2510 Atherosclerotic heart disease of native coronary artery without angina pectoris: Secondary | ICD-10-CM

## 2017-11-11 DIAGNOSIS — N184 Chronic kidney disease, stage 4 (severe): Secondary | ICD-10-CM | POA: Diagnosis not present

## 2017-11-11 DIAGNOSIS — Z950 Presence of cardiac pacemaker: Secondary | ICD-10-CM

## 2017-11-11 DIAGNOSIS — I5033 Acute on chronic diastolic (congestive) heart failure: Secondary | ICD-10-CM

## 2017-11-11 DIAGNOSIS — F419 Anxiety disorder, unspecified: Secondary | ICD-10-CM

## 2017-11-11 LAB — COAGUCHEK XS/INR WAIVED
INR: 2.7 — AB (ref 0.9–1.1)
PROTHROMBIN TIME: 32.5 s

## 2017-11-11 LAB — BAYER DCA HB A1C WAIVED: HB A1C: 4.8 % (ref <7.0)

## 2017-11-11 MED ORDER — DILTIAZEM HCL 120 MG PO TABS: 120.0000 mg | ORAL_TABLET | Freq: Every day | ORAL | 1 refills | Status: DC

## 2017-11-11 MED ORDER — SITAGLIPTIN PHOSPHATE 50 MG PO TABS: 50.0000 mg | ORAL_TABLET | Freq: Every day | ORAL | 1 refills | Status: DC

## 2017-11-11 MED ORDER — POTASSIUM CHLORIDE CRYS ER 20 MEQ PO TBCR: 20.0000 meq | EXTENDED_RELEASE_TABLET | Freq: Every morning | ORAL | 1 refills | Status: DC

## 2017-11-11 MED ORDER — WARFARIN SODIUM 2 MG PO TABS: ORAL_TABLET | ORAL | 0 refills | Status: DC

## 2017-11-11 NOTE — Progress Notes (Signed)
Subjective:    Patient ID: [name], female    DOB: [birth year], 83 y.o.   MRN: [id number]  HPI [name] is here today for follow up of chronic medical problem.  Outpatient Encounter Medications as of 11/11/2017  Medication Sig  . acetaminophen (TYLENOL) 500 MG tablet Take 1,000 mg by mouth every 6 (six) hours as needed for mild pain, moderate pain or fever.   . Blood Glucose Calibration (TRUE METRIX LEVEL 1) LOW SOLN Use weekly  . Blood Glucose Monitoring Suppl (TRUE METRIX AIR GLUCOSE METER) DEVI 1 Device by Does not apply route 2 (two) times daily. Use to Test blood sugar bid. DX E13.10  . calcium-vitamin D 250-100 MG-UNIT tablet Take 2 tablets by mouth daily.  . clonazePAM (KLONOPIN) 0.5 MG tablet TAKE ONE (1) TABLET THREE (3) TIMES EACH DAY  . CREON 36000 units CPEP capsule TAKE 3 CAPSULES WITH MEALS  AND TAKE 1 CAPSULE WITH A SNACK AS DIRECTED  . denosumab (PROLIA) 60 MG/ML SOLN injection INJECT 60MG (1ML) SUBCUTANEOUSLY EVERY SIX MONTHS  . diltiazem (CARDIZEM) 120 MG tablet TAKE 1 TABLET EVERY DAY  . ferrous sulfate 325 (65 FE) MG tablet Take 1 tablet (325 mg total) by mouth daily with breakfast.  . glucose blood (TRUE METRIX BLOOD GLUCOSE TEST) test strip TEST TWO TIMES DAILY AS DIRECTED  . guaiFENesin (MUCINEX) 600 MG 12 hr tablet Take 1 tablet (600 mg total) by mouth 2 (two) times daily.  [name] HYDROcodone-acetaminophen (NORCO) 7.5-325 MG tablet Take 1 tablet by mouth 3 (three) times daily as needed (pain).   [name] ipratropium-albuterol (DUONEB) 0.5-2.5 (3) MG/3ML SOLN INHALE THE CONTENTS OF 1 VIAL VIA NEBULIZER EVERY 4 HOURS AS NEEDED  . JANUVIA 50 MG tablet TAKE 1 TABLET EVERY DAY  . nitroGLYCERIN (NITROSTAT) 0.4 MG SL tablet Place 1 tablet (0.4 mg total) under the tongue every 5 (five) minutes as needed for chest pain.  . OXYGEN Inhale 2 L into the lungs at bedtime. 2lpm with sleep only   . pantoprazole (PROTONIX) 40 MG tablet TAKE 1 TABLET TWICE DAILY  . potassium  chloride SA (K-DUR,KLOR-CON) 20 MEQ tablet Take 1 tablet (20 mEq total) by mouth every morning.  . Probiotic Product ([place]) CAPS Take 1 capsule by mouth daily.  . simvastatin (ZOCOR) 10 MG tablet TAKE 1 TABLET EVERY DAY  . SYMBICORT 160-4.5 MCG/ACT inhaler INHALE 2 PUFFS TWICE DAILY  . TRUEPLUS LANCETS 28G MISC TEST BLOOD SUGAR TWICE DAILY  . VENTOLIN HFA 108 (90 Base) MCG/ACT inhaler USE 2 PUFFS EVERY 6 HOURS AS NEEDED  . warfarin (COUMADIN) 2 MG tablet TAKE PER PHYSICIAN INSTRUCTIONS     1. Type 2 diabetes mellitus with diabetic nephropathy, without long-term current use of insulin (HCC) last HGBA1C was 5.1%- she does not check blood sugars at home.  2. Hyperlipidemia with target LDL less than 100  Does not really watch diet very closely  3. Essential hypertension  No c/o chest pain, SOB or headache. Does not check blood presure at home BP Readings from Last 3 Encounters:  11/11/17 (!) 110/59  11/08/17 123/74  10/12/17 (!) 93/47     4. Permanent atrial fibrillation ([place])  Patient is on coumadin- last INR was 3.0. Due to be rechecked today  5. Coronary artery disease involving native coronary artery of native heart without angina pectoris last visit with cardiology was 08/18/17. According to note no changes were made to cre.  6. Mitral valve stenosis, unspecified etiology  No worse-  sees cardiology every 6 months  7. Nonrheumatic aortic valve stenosis   8. Acute on chronic diastolic CHF (congestive heart failure) (HCC)  Limits fluid intake. She always has some edema in lower legs at end of day.  9. COPD GOLD O   Uses symbicort daily- is doing quite well other then the pneumoenia she had at end of December 2018.  10. Gastroesophageal reflux disease without esophagitis  She is no protonix daily- gets very symptomatic when does not take meds  11. Osteoporosis, post-menopausal  Does very little weight bearing exercises due to unsteady gait. She no longer wants to do  dexascans  12. Chronic kidney disease (CKD), stage IV (severe) (HCC)  Currently just watching labs  13. Anxiety  Takes klonopin TID. She has been on this for years and gets very anxious when she does not take.    New complaints: * C/o right lower quadrant abdominal pain every night. She wants referral to [facility]  Social history: Lives by herself- does not have medical alert bracelet/system   Review of Systems  Constitutional: Negative for activity change and appetite change.  HENT: Negative.   Eyes: Negative for pain.  Respiratory: Negative for shortness of breath.   Cardiovascular: Negative for chest pain, palpitations and leg swelling.  Gastrointestinal: Positive for abdominal pain (right lower quadrant).  Endocrine: Negative for polydipsia.  Genitourinary: Negative.   Skin: Negative for rash.  Neurological: Negative for dizziness, weakness and headaches.  Hematological: Does not bruise/bleed easily.  Psychiatric/Behavioral: Negative.   All other systems reviewed and are negative.      Objective:   Physical Exam  Constitutional: She is oriented to person, place, and time. She appears well-developed and well-nourished. No distress.  HENT:  Nose: Nose normal.  Mouth/Throat: Oropharynx is clear and moist.  Eyes: EOM are normal.  Neck: Trachea normal, normal range of motion and full passive range of motion without pain. Neck supple. No JVD present. Carotid bruit is not present. No thyromegaly present.  Cardiovascular: Normal rate, regular rhythm and intact distal pulses. Exam reveals no gallop and no friction rub.  Murmur (2/6 systolic murmur) heard. Pulmonary/Chest: Effort normal and breath sounds normal.  Abdominal: Soft. Bowel sounds are normal. She exhibits no distension and no mass. There is tenderness (mild right lower quadrant tendernes bil).  Musculoskeletal: Normal range of motion. She exhibits no edema.  Lymphadenopathy:    She has no cervical  adenopathy.  Neurological: She is alert and oriented to person, place, and time. She has normal reflexes.  Skin: Skin is warm and dry.  Psychiatric: She has a normal mood and affect. Her behavior is normal. Judgment and thought content normal.   BP (!) 110/59   Pulse 60   Temp 98.2 F (36.8 C) (Oral)   Ht 5' (1.524 m)   Wt 117 lb (53.1 kg)   BMI 22.85 kg/m   HGBA1C 4.8%      Assessment & Plan:  1. Type 2 diabetes mellitus with diabetic nephropathy, without long-term current use of insulin (HCC) Continue to watch carbs in diet - Bayer DCA Hb A1c Waived  2. Hyperlipidemia with target LDL less than 100 Low fat diet - Lipid panel  3. Essential hypertension Low sodium diet - CMP14+EGFR  4. Permanent atrial fibrillation ([place]) Keep follow up with cardiology - CoaguChek XS/INR Waived  5. Coronary artery disease involving native coronary artery of native heart without angina pectoris  6. Mitral valve stenosis, unspecified etiology  7. Nonrheumatic aortic valve  stenosis  8. Acute on chronic diastolic CHF (congestive heart failure) (HCC) Limit fluid intake  9. COPD GOLD O  Continue symbicort as rx  10. Gastroesophageal reflux disease without esophagitis Avoid spicy foods Do not eat 2 hours prior to bedtime  11. Osteoporosis, post-menopausal Continue proloia  12. Chronic kidney disease (CKD), stage IV (severe) (HCC) Will continue to monitor labs  13. Anxiety Stress management continue klonopin  14. Right lower quadrant pain - Ambulatory referral to Gastroenterology  15. Hypokalemia - potassium chloride SA (K-DUR,KLOR-CON) 20 MEQ tablet; Take 1 tablet (20 mEq total) by mouth every morning.  Dispense: 90 tablet; Refill: 1  16. Pacemaker-St.Jude - warfarin (COUMADIN) 2 MG tablet; TAKE PER PHYSICIAN INSTRUCTIONS  Dispense: 135 tablet; Refill: 0   Patient will schedule eye exam Labs pending Health maintenance reviewed Diet and exercise encouraged Continue all  meds Follow up  In 1 month for INR and 6 months for folloe up   [place], FNP

## 2017-11-12 LAB — CMP14+EGFR
ALBUMIN: 3.7 g/dL (ref 3.5–4.7)
ALT: 8 IU/L (ref 0–32)
AST: 20 IU/L (ref 0–40)
Albumin/Globulin Ratio: 1.5 (ref 1.2–2.2)
Alkaline Phosphatase: 60 IU/L (ref 39–117)
BILIRUBIN TOTAL: 0.3 mg/dL (ref 0.0–1.2)
BUN/Creatinine Ratio: 12 (ref 12–28)
BUN: 16 mg/dL (ref 8–27)
CALCIUM: 8.3 mg/dL — AB (ref 8.7–10.3)
CHLORIDE: 105 mmol/L (ref 96–106)
CO2: 21 mmol/L (ref 20–29)
Creatinine, Ser: 1.39 mg/dL — ABNORMAL HIGH (ref 0.57–1.00)
GFR calc non Af Amer: 36 mL/min/{1.73_m2} — ABNORMAL LOW (ref >59)
GFR, EST AFRICAN AMERICAN: 41 mL/min/{1.73_m2} — AB (ref >59)
Globulin, Total: 2.4 g/dL (ref 1.5–4.5)
Glucose: 115 mg/dL — ABNORMAL HIGH (ref 65–99)
Potassium: 4.5 mmol/L (ref 3.5–5.2)
Sodium: 142 mmol/L (ref 134–144)
TOTAL PROTEIN: 6.1 g/dL (ref 6.0–8.5)

## 2017-11-12 LAB — LIPID PANEL
Chol/HDL Ratio: 4 ratio (ref 0.0–4.4)
Cholesterol, Total: 112 mg/dL (ref 100–199)
HDL: 28 mg/dL — AB (ref >39)
LDL CALC: 64 mg/dL (ref 0–99)
Triglycerides: 99 mg/dL (ref 0–149)
VLDL CHOLESTEROL CAL: 20 mg/dL (ref 5–40)

## 2017-11-17 ENCOUNTER — Ambulatory Visit (INDEPENDENT_AMBULATORY_CARE_PROVIDER_SITE_OTHER): Payer: Medicare HMO | Admitting: *Deleted

## 2017-11-17 DIAGNOSIS — I495 Sick sinus syndrome: Secondary | ICD-10-CM | POA: Diagnosis not present

## 2017-11-18 ENCOUNTER — Encounter: Payer: Self-pay | Admitting: Cardiology

## 2017-11-18 NOTE — Progress Notes (Signed)
Remote pacemaker transmission.   

## 2017-11-22 ENCOUNTER — Other Ambulatory Visit: Payer: Self-pay | Admitting: Nurse Practitioner

## 2017-11-23 ENCOUNTER — Ambulatory Visit (INDEPENDENT_AMBULATORY_CARE_PROVIDER_SITE_OTHER): Payer: Medicare HMO | Admitting: Cardiovascular Disease

## 2017-11-23 ENCOUNTER — Encounter: Payer: Self-pay | Admitting: Cardiovascular Disease

## 2017-11-23 ENCOUNTER — Other Ambulatory Visit: Payer: Self-pay

## 2017-11-23 VITALS — BP 100/61 | HR 57 | Ht 62.0 in | Wt 124.0 lb

## 2017-11-23 DIAGNOSIS — I482 Chronic atrial fibrillation: Secondary | ICD-10-CM | POA: Diagnosis not present

## 2017-11-23 DIAGNOSIS — I25118 Atherosclerotic heart disease of native coronary artery with other forms of angina pectoris: Secondary | ICD-10-CM

## 2017-11-23 DIAGNOSIS — I350 Nonrheumatic aortic (valve) stenosis: Secondary | ICD-10-CM

## 2017-11-23 DIAGNOSIS — I779 Disorder of arteries and arterioles, unspecified: Secondary | ICD-10-CM

## 2017-11-23 DIAGNOSIS — Z720 Tobacco use: Secondary | ICD-10-CM | POA: Diagnosis not present

## 2017-11-23 DIAGNOSIS — I739 Peripheral vascular disease, unspecified: Secondary | ICD-10-CM

## 2017-11-23 DIAGNOSIS — I4821 Permanent atrial fibrillation: Secondary | ICD-10-CM

## 2017-11-23 DIAGNOSIS — Z950 Presence of cardiac pacemaker: Secondary | ICD-10-CM | POA: Diagnosis not present

## 2017-11-23 DIAGNOSIS — I5032 Chronic diastolic (congestive) heart failure: Secondary | ICD-10-CM | POA: Diagnosis not present

## 2017-11-23 NOTE — Progress Notes (Signed)
SUBJECTIVE: Patient presents for routine follow-up.  She has a history of COPD, chronic diastolic heart failure, permanent atrial fibrillation/flutter, and has a pacemaker.  She also has a h/o mild nonobstructive CAD and a previous tachycardia-induced cardiomyopathy which has since normalized in function.   Coronary angiography on 04/04/2010 demonstrated a 40% proximal LAD lesion, 20% mid left circumflex coronary artery lesion, and the RCA had a 20% proximal, 50-60% mid vessel, and distal 30% stenosis.  Echocardiogram 04/29/16 normal left ventricular systolic function, EF 46-27%, severe left atrial enlargement, mild mitral regurgitation, mild calcific aortic stenosis, mean gradient 11 mmHg, mildly dilated right ventricle with mildly reduced contraction, mild tricuspid regurgitation, PA S/P 42 mmHg, and moderate right atrial enlargement.  Nuclear stress test 03/24/16 showed no perfusion defects consistent with prior infarct or recurrent ischemia.  She was hospitalized for community-acquired pneumonia and a COPD exacerbation with mild acute on chronic diastolic heart failure in December 2018.  She currently denies any chest pain.  She feels cold at times which she attributes to being on a blood thinner.  She also has seasonal allergies and rhinorrhea.  She only takes Lasix as needed.  Lipids on 11/11/17 showed LDL 64.  BUN was 16 and creatinine was 1.39.  Hemoglobin was 9.4 on 08/08/17.  She is scheduled see a gastroenterologist in the near future.  Social history: She has a son who lives in [place] and a daughter who lives in [place] down the road from her.  They are both in their early 66s.   Review of Systems: As per "subjective", otherwise negative.  Allergies  Allergen Reactions  . Macrobid [Nitrofurantoin Monohyd Macro] Rash  . Torsemide Nausea And Vomiting  . Tramadol Nausea Only    Current Outpatient Medications  Medication Sig Dispense Refill  . acetaminophen  (TYLENOL) 500 MG tablet Take 1,000 mg by mouth every 6 (six) hours as needed for mild pain, moderate pain or fever.     . Blood Glucose Calibration (TRUE METRIX LEVEL 1) LOW SOLN Use weekly 3 each 1  . Blood Glucose Monitoring Suppl (TRUE METRIX AIR GLUCOSE METER) DEVI 1 Device by Does not apply route 2 (two) times daily. Use to Test blood sugar bid. DX E13.10 1 Device 0  . calcium-vitamin D 250-100 MG-UNIT tablet Take 2 tablets by mouth daily.    . clonazePAM (KLONOPIN) 0.5 MG tablet TAKE ONE (1) TABLET THREE (3) TIMES EACH DAY 90 tablet 2  . CREON 36000 units CPEP capsule TAKE 3 CAPSULES WITH MEALS  AND TAKE 1 CAPSULE WITH A SNACK AS DIRECTED 900 capsule 2  . denosumab (PROLIA) 60 MG/ML SOLN injection INJECT 60MG  (1ML) SUBCUTANEOUSLY EVERY SIX MONTHS 1 each 0  . diltiazem (CARDIZEM) 120 MG tablet Take 1 tablet (120 mg total) by mouth daily. 90 tablet 1  . ferrous sulfate 325 (65 FE) MG tablet Take 1 tablet (325 mg total) by mouth daily with breakfast.  3  . glucose blood (TRUE METRIX BLOOD GLUCOSE TEST) test strip TEST TWO TIMES DAILY AS DIRECTED 100 each 5  . guaiFENesin (MUCINEX) 600 MG 12 hr tablet Take 1 tablet (600 mg total) by mouth 2 (two) times daily. 60 tablet 2  . HYDROcodone-acetaminophen (NORCO) 7.5-325 MG tablet Take 1 tablet by mouth 3 (three) times daily as needed (pain).     [name] ipratropium-albuterol (DUONEB) 0.5-2.5 (3) MG/3ML SOLN INHALE THE CONTENTS OF 1 VIAL VIA NEBULIZER EVERY 4 HOURS AS NEEDED 1080 mL 5  . nitroGLYCERIN (NITROSTAT) 0.4  MG SL tablet Place 1 tablet (0.4 mg total) under the tongue every 5 (five) minutes as needed for chest pain. 25 tablet 3  . OXYGEN Inhale 2 L into the lungs at bedtime. 2lpm with sleep only     . pantoprazole (PROTONIX) 40 MG tablet TAKE 1 TABLET TWICE DAILY 180 tablet 1  . potassium chloride SA (K-DUR,KLOR-CON) 20 MEQ tablet Take 1 tablet (20 mEq total) by mouth every morning. 90 tablet 1  . Probiotic Product ([place]) CAPS Take 1  capsule by mouth daily.    . simvastatin (ZOCOR) 10 MG tablet TAKE 1 TABLET EVERY DAY 90 tablet 1  . sitaGLIPtin (JANUVIA) 50 MG tablet Take 1 tablet (50 mg total) by mouth daily. 90 tablet 1  . SYMBICORT 160-4.5 MCG/ACT inhaler INHALE 2 PUFFS TWICE DAILY 3 Inhaler 1  . TRUEPLUS LANCETS 28G MISC TEST BLOOD SUGAR TWICE DAILY 200 each 1  . VENTOLIN HFA 108 (90 Base) MCG/ACT inhaler USE 2 PUFFS EVERY 6 HOURS AS NEEDED 18 g 4  . warfarin (COUMADIN) 2 MG tablet TAKE PER PHYSICIAN INSTRUCTIONS 135 tablet 0   No current facility-administered medications for this visit.     Past Medical History:  Diagnosis Date  . Abnormal CT of the chest    Mediastinal and hilar adenopathy followed by Dr. Koleen Nimrod in [place]  . Anemia, unspecified   . Body mass index (BMI) of 20.0-20.9 in adult FEB 2013 147 LBS  . CAD (coronary artery disease) 2011   Catheterization August 2011: mild nonobstructive coronary artery disease complicated by large left rectus sheath hematoma status post evacuation Coumadin restarted without recurrence  . Carotid artery disease ([place])    Doppler, 05/2012, 0-39% bilateral  . Cataract   . Chronic airway obstruction, not elsewhere classified   . Chronic kidney disease, stage II (mild)   . Congestive heart failure, unspecified    EF now 60%, previous nonischemic cardiomyopathy  . Diabetes mellitus   . Dilated bile duct 10/19/2011  . Diverticulitis Dec 2012  . Gastritis    By EGD  . GERD (gastroesophageal reflux disease)   . Hypercholesterolemia   . Hypertension   . Mitral regurgitation 06/27/2013  . Neurogenic sleep apnea    Uses noctural oxygen prescribed by her pulmonologi  . Pacemaker    Single chamber [place]. Jude ACCENT [phone] SN 719 04/11/1957 10/31/2010  . Permanent atrial fibrillation (HCC)    H/o tachybradycardia syndrome on Coumadin anticoagulation, has pacemaker.  . Recurrent UTI   . Rheumatic heart disease   . Tachycardia-bradycardia [facility])    s/p St. Jude single  chamber PPM 10/2010   . Unspecified hypertensive kidney disease with chronic kidney disease stage I through stage IV, or unspecified(403.90)   . Valvular heart disease    a. H/o moderate mitral stenosis by cath 2011 but no mention of this on 10/2011 echo. b. 10/2011 echo: mod MR, mild AS, mild TR; EF>65%  . Vitamin D deficiency     Past Surgical History:  Procedure Laterality Date  . CHOLECYSTECTOMY     40+ years ago  . COLONOSCOPY  10/2011   Dyann Ruddle sigmoid to descending diverticulosis, internal hemorrhoids  . ESOPHAGOGASTRODUODENOSCOPY  10/2011   Fields-erosive gastritis, biopsy with no H. pylori, hiatal hernia, peptic duodenitis, no celiac disease, duodenal diverticulum, duodenal nodule  . EUS  11/16/11   Mishra-13 MM CBD, normal pancreatic duct, otherwise normal EUS  . Evacuation of retroperitoneal and left rectus sheath    . HEMATOMA EVACUATION  femoral  . HERNIA REPAIR     X3  . PILONIDAL CYST / SINUS EXCISION    . Single-chamber pacemaker implantation  3/12   by Dr Caryl Comes    Social History   Socioeconomic History  . Marital status: Single    Spouse name: Not on file  . Number of children: 2  . Years of education: 8  . Highest education level: 8th grade  Occupational History  . Occupation: RETIRED    Employer: RETIRED    Comment: [employer]  Social Needs  . Financial resource strain: Not hard at all  . Food insecurity:    Worry: Never true    Inability: Never true  . Transportation needs:    Medical: No    Non-medical: No  Tobacco Use  . Smoking status: Current Every Day Smoker    Packs/day: 0.25    Years: 45.00    Pack years: 11.25    Types: Cigarettes    Start date: 02/05/1958  . Smokeless tobacco: Never Used  . Tobacco comment: smokes 5-6 cigarettes daily  Substance and Sexual Activity  . Alcohol use: No    Alcohol/week: 0.0 oz  . Drug use: No  . Sexual activity: Not Currently  Lifestyle  . Physical activity:    Days per week: 0 days    Minutes  per session: 0 min  . Stress: To some extent  Relationships  . Social connections:    Talks on phone: More than three times a week    Gets together: More than three times a week    Attends religious service: Never    Active member of club or organization: No    Attends meetings of clubs or organizations: Never    Relationship status: Never married  . Intimate partner violence:    Fear of current or ex partner: No    Emotionally abused: No    Physically abused: No    Forced sexual activity: No  Other Topics Concern  . Not on file  Social History Narrative  . Not on file     Vitals:   11/23/17 1517  BP: 100/61  Pulse: (!) 57  SpO2: 97%  Weight: 124 lb (56.2 kg)  Height: 5\' 2"  (1.575 m)    Wt Readings from Last 3 Encounters:  11/23/17 124 lb (56.2 kg)  11/11/17 117 lb (53.1 kg)  11/08/17 127 lb (57.6 kg)     PHYSICAL EXAM General: NAD HEENT:Poor dentition Neck: No JVD, no thyromegaly. Lungs: No wheezes or crackles, poor air entry. CV: Nondisplaced PMI. Regular rate and irregular rhythm, normal S1/S2, no S3, II/VI ejection systolic murmur heard at RUSB and LUSB.  Trivial pretibial edema. Chronic stasis dermatitis. Abdomen: Soft, nontender, no distention.  Neurologic: Alert and oriented.  Psych: Normal affect. Skin: Normal. Musculoskeletal: No gross deformities.    ECG: Most recent ECG reviewed.   Labs: Lab Results  Component Value Date/Time   K 4.5 11/11/2017 01:25 PM   K 4.5 04/02/2012 07:39 AM   BUN 16 11/11/2017 01:25 PM   CREATININE 1.39 (H) 11/11/2017 01:25 PM   CREATININE 1.38 (H) 01/18/2013 12:42 PM   ALT 8 11/11/2017 01:25 PM   TSH 3.150 11/01/2015 11:10 AM   HGB 9.4 (L) 08/08/2017 06:56 AM   HGB 11.7 07/13/2017 03:07 PM     Lipids: Lab Results  Component Value Date/Time   LDLCALC 64 11/11/2017 01:25 PM   LDLCALC 66 05/21/2014 01:21 PM   LDLCALC 78 01/18/2013 12:42 PM  CHOL 112 11/11/2017 01:25 PM   CHOL 125 01/18/2013 12:42 PM    TRIG 99 11/11/2017 01:25 PM   TRIG 102 12/31/2014 02:53 PM   TRIG 68 01/18/2013 12:42 PM   HDL 28 (L) 11/11/2017 01:25 PM   HDL 25 (L) 12/31/2014 02:53 PM   HDL 33 (L) 01/18/2013 12:42 PM       ASSESSMENT AND PLAN: 1. Permanent atrial fibrillation and flutter s/p pacemaker: Symptomatically stable.  Continue long-acting diltiazem 120 mg. I am not inclined to reintroduce metoprolol in the future should palpitations recur given her severe COPD. Continue warfarin. No bleeding complications noted.   2. Valvular heart disease: Mild aortic stenosis and mitral and tricuspid regurgitation as noted above. Will monitor.  3. Carotid artery stenosis: Mild bilaterally in October 2013.   4. Chronic diastolic heart failure: Euvolemic.   Only takes Lasix as needed.  5. Tobacco abuse: Cessation counseling previously provided.  6. Type II diabetes: On Januvia.   7. COPD: Not decompensated at present.  8. CAD: Symptomatically stable.  RCA lesion was 50-60% in 2011. Dobutamine Myoview stress test showed no ischemia or scar. Continue SL nitroglycerin to be used prn. Continue statin.  9. Hypercholesterolemia: Lipids reviewed above. Continue simvastatin.      Disposition: Follow up 6 months   [name], M.D., F.A.C.C.

## 2017-11-23 NOTE — Patient Instructions (Signed)

## 2017-11-25 DIAGNOSIS — Z7984 Long term (current) use of oral hypoglycemic drugs: Secondary | ICD-10-CM | POA: Diagnosis not present

## 2017-11-25 DIAGNOSIS — H43393 Other vitreous opacities, bilateral: Secondary | ICD-10-CM | POA: Diagnosis not present

## 2017-11-25 DIAGNOSIS — E119 Type 2 diabetes mellitus without complications: Secondary | ICD-10-CM | POA: Diagnosis not present

## 2017-11-25 DIAGNOSIS — H25813 Combined forms of age-related cataract, bilateral: Secondary | ICD-10-CM | POA: Diagnosis not present

## 2017-11-25 LAB — HM DIABETES EYE EXAM

## 2017-12-02 DIAGNOSIS — R109 Unspecified abdominal pain: Secondary | ICD-10-CM | POA: Diagnosis not present

## 2017-12-02 DIAGNOSIS — K529 Noninfective gastroenteritis and colitis, unspecified: Secondary | ICD-10-CM | POA: Diagnosis not present

## 2017-12-07 DIAGNOSIS — H25811 Combined forms of age-related cataract, right eye: Secondary | ICD-10-CM | POA: Diagnosis not present

## 2017-12-07 DIAGNOSIS — H25812 Combined forms of age-related cataract, left eye: Secondary | ICD-10-CM | POA: Diagnosis not present

## 2017-12-07 DIAGNOSIS — H25813 Combined forms of age-related cataract, bilateral: Secondary | ICD-10-CM | POA: Diagnosis not present

## 2017-12-08 ENCOUNTER — Other Ambulatory Visit (HOSPITAL_COMMUNITY): Payer: Self-pay | Admitting: Gastroenterology

## 2017-12-08 DIAGNOSIS — R109 Unspecified abdominal pain: Secondary | ICD-10-CM

## 2017-12-09 DIAGNOSIS — M545 Low back pain: Secondary | ICD-10-CM | POA: Diagnosis not present

## 2017-12-09 DIAGNOSIS — F1120 Opioid dependence, uncomplicated: Secondary | ICD-10-CM | POA: Diagnosis not present

## 2017-12-09 DIAGNOSIS — Z79899 Other long term (current) drug therapy: Secondary | ICD-10-CM | POA: Diagnosis not present

## 2017-12-09 DIAGNOSIS — G8929 Other chronic pain: Secondary | ICD-10-CM | POA: Diagnosis not present

## 2017-12-10 DIAGNOSIS — J449 Chronic obstructive pulmonary disease, unspecified: Secondary | ICD-10-CM | POA: Diagnosis not present

## 2017-12-10 DIAGNOSIS — J188 Other pneumonia, unspecified organism: Secondary | ICD-10-CM | POA: Diagnosis not present

## 2017-12-10 DIAGNOSIS — J441 Chronic obstructive pulmonary disease with (acute) exacerbation: Secondary | ICD-10-CM | POA: Diagnosis not present

## 2017-12-13 ENCOUNTER — Encounter: Payer: Self-pay | Admitting: Nurse Practitioner

## 2017-12-13 ENCOUNTER — Ambulatory Visit (INDEPENDENT_AMBULATORY_CARE_PROVIDER_SITE_OTHER): Payer: Medicare HMO | Admitting: Nurse Practitioner

## 2017-12-13 VITALS — BP 118/60 | HR 59 | Temp 97.4°F | Ht 62.0 in | Wt 127.0 lb

## 2017-12-13 DIAGNOSIS — I4821 Permanent atrial fibrillation: Secondary | ICD-10-CM

## 2017-12-13 DIAGNOSIS — Z7901 Long term (current) use of anticoagulants: Secondary | ICD-10-CM | POA: Diagnosis not present

## 2017-12-13 DIAGNOSIS — I482 Chronic atrial fibrillation: Secondary | ICD-10-CM | POA: Diagnosis not present

## 2017-12-13 LAB — COAGUCHEK XS/INR WAIVED
INR: 2.4 — ABNORMAL HIGH (ref 0.9–1.1)
Prothrombin Time: 28.7 s

## 2017-12-13 NOTE — Addendum Note (Signed)
Addended by: [name] on: 12/13/2017 03:52 PM   Modules accepted: Level of Service

## 2017-12-13 NOTE — Progress Notes (Signed)
Subjective:  Chief Complaint: Recheck protime   Indication: atrial fibrillation Bleeding signs/symptoms: None Thromboembolic signs/symptoms: None  Missed Coumadin doses: None Medication changes: no Dietary changes: no Bacterial/viral infection: no Other concerns: no  The following portions of the patient's history were reviewed and updated as appropriate: allergies, current medications, past family history, past medical history, past social history, past surgical history and problem list.  Review of Systems Pertinent items noted in HPI and remainder of comprehensive ROS otherwise negative.   Objective:    INR Today: 2.4 Current dose: coumadin 1mg  daily    Assessment:    Therapeutic INR for goal of 2-3   Plan:    1. New dose: no change   2. Next INR: 1 month    [name], FNP

## 2017-12-16 ENCOUNTER — Ambulatory Visit: Payer: Medicare HMO | Admitting: Nurse Practitioner

## 2017-12-16 DIAGNOSIS — H2512 Age-related nuclear cataract, left eye: Secondary | ICD-10-CM | POA: Diagnosis not present

## 2017-12-16 DIAGNOSIS — H25812 Combined forms of age-related cataract, left eye: Secondary | ICD-10-CM | POA: Diagnosis not present

## 2017-12-17 LAB — CUP PACEART REMOTE DEVICE CHECK
Battery Remaining Percentage: 91 %
Brady Statistic RV Percent Paced: 7.5 %
Date Time Interrogation Session: 20190410181311
Lead Channel Pacing Threshold Amplitude: 0.75 V
Lead Channel Sensing Intrinsic Amplitude: 12 mV
Lead Channel Setting Pacing Amplitude: 2.5 V
Lead Channel Setting Pacing Pulse Width: 0.5 ms
Lead Channel Setting Sensing Sensitivity: 2 mV
MDC IDC LEAD IMPLANT DT: 20120323
MDC IDC LEAD LOCATION: 753860
MDC IDC MSMT BATTERY REMAINING LONGEVITY: 133 mo
MDC IDC MSMT BATTERY VOLTAGE: 2.95 V
MDC IDC MSMT LEADCHNL RV IMPEDANCE VALUE: 730 Ohm
MDC IDC MSMT LEADCHNL RV PACING THRESHOLD PULSEWIDTH: 0.5 ms
MDC IDC PG IMPLANT DT: 20120323
Pulse Gen Model: 1110
Pulse Gen Serial Number: 7199163

## 2017-12-24 DIAGNOSIS — Z9842 Cataract extraction status, left eye: Secondary | ICD-10-CM | POA: Diagnosis not present

## 2017-12-24 DIAGNOSIS — Z961 Presence of intraocular lens: Secondary | ICD-10-CM | POA: Diagnosis not present

## 2017-12-24 DIAGNOSIS — H43393 Other vitreous opacities, bilateral: Secondary | ICD-10-CM | POA: Diagnosis not present

## 2017-12-24 DIAGNOSIS — H25811 Combined forms of age-related cataract, right eye: Secondary | ICD-10-CM | POA: Diagnosis not present

## 2017-12-27 ENCOUNTER — Ambulatory Visit (HOSPITAL_COMMUNITY)
Admission: RE | Admit: 2017-12-27 | Discharge: 2017-12-27 | Disposition: A | Discharge status: Home / Self Care | Payer: Medicare HMO | Source: Ambulatory Visit | Attending: Gastroenterology | Admitting: Gastroenterology

## 2017-12-27 DIAGNOSIS — K439 Ventral hernia without obstruction or gangrene: Secondary | ICD-10-CM | POA: Insufficient documentation

## 2017-12-27 DIAGNOSIS — Z9049 Acquired absence of other specified parts of digestive tract: Secondary | ICD-10-CM | POA: Diagnosis not present

## 2017-12-27 DIAGNOSIS — R109 Unspecified abdominal pain: Secondary | ICD-10-CM | POA: Diagnosis not present

## 2017-12-27 DIAGNOSIS — N2889 Other specified disorders of kidney and ureter: Secondary | ICD-10-CM | POA: Diagnosis not present

## 2017-12-27 DIAGNOSIS — K838 Other specified diseases of biliary tract: Secondary | ICD-10-CM | POA: Insufficient documentation

## 2017-12-27 DIAGNOSIS — Z9889 Other specified postprocedural states: Secondary | ICD-10-CM | POA: Diagnosis not present

## 2017-12-27 LAB — POCT I-STAT CREATININE: Creatinine, Ser: 1.6 mg/dL — ABNORMAL HIGH (ref 0.44–1.00)

## 2017-12-27 MED ORDER — IOPAMIDOL (ISOVUE-300) INJECTION 61%
80.0000 mL | Freq: Once | INTRAVENOUS | Status: AC | PRN
  Administered 2017-12-27: 80 mL via INTRAVENOUS

## 2017-12-27 MED ORDER — IOPAMIDOL (ISOVUE-300) INJECTION 61%: 80.0000 mL | Freq: Once | INTRAVENOUS | Status: DC | PRN

## 2018-01-04 ENCOUNTER — Other Ambulatory Visit: Payer: Self-pay | Admitting: Nurse Practitioner

## 2018-01-04 DIAGNOSIS — B351 Tinea unguium: Secondary | ICD-10-CM | POA: Diagnosis not present

## 2018-01-04 DIAGNOSIS — M79676 Pain in unspecified toe(s): Secondary | ICD-10-CM | POA: Diagnosis not present

## 2018-01-04 DIAGNOSIS — E1142 Type 2 diabetes mellitus with diabetic polyneuropathy: Secondary | ICD-10-CM | POA: Diagnosis not present

## 2018-01-04 DIAGNOSIS — L84 Corns and callosities: Secondary | ICD-10-CM | POA: Diagnosis not present

## 2018-01-06 DIAGNOSIS — M545 Low back pain: Secondary | ICD-10-CM | POA: Diagnosis not present

## 2018-01-06 DIAGNOSIS — G8929 Other chronic pain: Secondary | ICD-10-CM | POA: Diagnosis not present

## 2018-01-06 DIAGNOSIS — Z79899 Other long term (current) drug therapy: Secondary | ICD-10-CM | POA: Diagnosis not present

## 2018-01-10 DIAGNOSIS — J188 Other pneumonia, unspecified organism: Secondary | ICD-10-CM | POA: Diagnosis not present

## 2018-01-10 DIAGNOSIS — J449 Chronic obstructive pulmonary disease, unspecified: Secondary | ICD-10-CM | POA: Diagnosis not present

## 2018-01-10 DIAGNOSIS — J441 Chronic obstructive pulmonary disease with (acute) exacerbation: Secondary | ICD-10-CM | POA: Diagnosis not present

## 2018-01-19 ENCOUNTER — Telehealth: Payer: Self-pay | Admitting: Nurse Practitioner

## 2018-01-20 DIAGNOSIS — K838 Other specified diseases of biliary tract: Secondary | ICD-10-CM | POA: Diagnosis not present

## 2018-01-20 DIAGNOSIS — K529 Noninfective gastroenteritis and colitis, unspecified: Secondary | ICD-10-CM | POA: Diagnosis not present

## 2018-01-20 DIAGNOSIS — R1031 Right lower quadrant pain: Secondary | ICD-10-CM | POA: Diagnosis not present

## 2018-01-21 ENCOUNTER — Ambulatory Visit: Payer: Medicare HMO | Admitting: Nurse Practitioner

## 2018-01-24 NOTE — Telephone Encounter (Signed)
Last given 09/13/17. Due 03/14/18.  Called to let patient know that we will process everything a couple of weeks before this date. Left information on her home voicemail.

## 2018-02-01 ENCOUNTER — Ambulatory Visit: Payer: Medicare HMO | Admitting: Nurse Practitioner

## 2018-02-01 DIAGNOSIS — Z01818 Encounter for other preprocedural examination: Secondary | ICD-10-CM | POA: Diagnosis not present

## 2018-02-01 DIAGNOSIS — H25811 Combined forms of age-related cataract, right eye: Secondary | ICD-10-CM | POA: Diagnosis not present

## 2018-02-01 DIAGNOSIS — Z961 Presence of intraocular lens: Secondary | ICD-10-CM | POA: Diagnosis not present

## 2018-02-01 DIAGNOSIS — H353131 Nonexudative age-related macular degeneration, bilateral, early dry stage: Secondary | ICD-10-CM | POA: Diagnosis not present

## 2018-02-01 DIAGNOSIS — H35352 Cystoid macular degeneration, left eye: Secondary | ICD-10-CM | POA: Diagnosis not present

## 2018-02-03 DIAGNOSIS — M545 Low back pain: Secondary | ICD-10-CM | POA: Diagnosis not present

## 2018-02-03 DIAGNOSIS — G8929 Other chronic pain: Secondary | ICD-10-CM | POA: Diagnosis not present

## 2018-02-03 DIAGNOSIS — Z79899 Other long term (current) drug therapy: Secondary | ICD-10-CM | POA: Diagnosis not present

## 2018-02-03 DIAGNOSIS — Z6823 Body mass index (BMI) 23.0-23.9, adult: Secondary | ICD-10-CM | POA: Diagnosis not present

## 2018-02-04 ENCOUNTER — Other Ambulatory Visit: Payer: Self-pay | Admitting: Nurse Practitioner

## 2018-02-04 DIAGNOSIS — F419 Anxiety disorder, unspecified: Secondary | ICD-10-CM

## 2018-02-04 NOTE — Telephone Encounter (Signed)
Last seen 12/13/17

## 2018-02-09 ENCOUNTER — Ambulatory Visit (INDEPENDENT_AMBULATORY_CARE_PROVIDER_SITE_OTHER): Payer: Medicare HMO | Admitting: Nurse Practitioner

## 2018-02-09 ENCOUNTER — Encounter: Payer: Self-pay | Admitting: Nurse Practitioner

## 2018-02-09 VITALS — BP 108/53 | HR 62 | Temp 98.4°F | Ht 62.0 in | Wt 125.0 lb

## 2018-02-09 DIAGNOSIS — I482 Chronic atrial fibrillation: Secondary | ICD-10-CM

## 2018-02-09 DIAGNOSIS — I4821 Permanent atrial fibrillation: Secondary | ICD-10-CM

## 2018-02-09 DIAGNOSIS — J441 Chronic obstructive pulmonary disease with (acute) exacerbation: Secondary | ICD-10-CM | POA: Diagnosis not present

## 2018-02-09 DIAGNOSIS — J188 Other pneumonia, unspecified organism: Secondary | ICD-10-CM | POA: Diagnosis not present

## 2018-02-09 DIAGNOSIS — J449 Chronic obstructive pulmonary disease, unspecified: Secondary | ICD-10-CM | POA: Diagnosis not present

## 2018-02-09 DIAGNOSIS — Z7901 Long term (current) use of anticoagulants: Secondary | ICD-10-CM

## 2018-02-09 LAB — COAGUCHEK XS/INR WAIVED
INR: 1.9 — ABNORMAL HIGH (ref 0.9–1.1)
Prothrombin Time: 22.8 s

## 2018-02-09 NOTE — Progress Notes (Signed)
Subjective:   Chief Complaint: INR recheck   Indication: atrial fibrillation Bleeding signs/symptoms: None Thromboembolic signs/symptoms: None  Missed Coumadin doses: None Medication changes: no Dietary changes: no Bacterial/viral infection: no Other concerns: no  The following portions of the patient's history were reviewed and updated as appropriate: allergies, current medications, past family history, past medical history, past social history, past surgical history and problem list.  Review of Systems Pertinent items noted in HPI and remainder of comprehensive ROS otherwise negative.   Objective:    INR Today: 1.9 Current dose: coumadin 1mg  daily    Assessment:    Subtherapeutic INR for goal of 2-3   Plan:    1. New dose: coumadin 1mg  daily except 2mg  today and tomorrow then back to 1mg  daily   2. Next INR: 1 month    [name], FNP

## 2018-02-14 ENCOUNTER — Other Ambulatory Visit: Payer: Self-pay | Admitting: Nurse Practitioner

## 2018-02-14 DIAGNOSIS — E785 Hyperlipidemia, unspecified: Secondary | ICD-10-CM

## 2018-02-14 DIAGNOSIS — Z720 Tobacco use: Secondary | ICD-10-CM

## 2018-02-14 DIAGNOSIS — Z950 Presence of cardiac pacemaker: Secondary | ICD-10-CM

## 2018-02-16 ENCOUNTER — Ambulatory Visit (INDEPENDENT_AMBULATORY_CARE_PROVIDER_SITE_OTHER): Payer: Medicare HMO | Admitting: *Deleted

## 2018-02-16 ENCOUNTER — Telehealth: Payer: Self-pay | Admitting: Cardiology

## 2018-02-16 DIAGNOSIS — I495 Sick sinus syndrome: Secondary | ICD-10-CM | POA: Diagnosis not present

## 2018-02-16 NOTE — Telephone Encounter (Signed)
Spoke with pt and reminded pt of remote transmission that is due today. Pt verbalized understanding.   

## 2018-02-17 NOTE — Progress Notes (Signed)
Remote pacemaker transmission.   

## 2018-02-21 ENCOUNTER — Other Ambulatory Visit: Payer: Self-pay | Admitting: Family

## 2018-02-21 ENCOUNTER — Other Ambulatory Visit: Payer: Self-pay | Admitting: Nurse Practitioner

## 2018-02-24 DIAGNOSIS — H2511 Age-related nuclear cataract, right eye: Secondary | ICD-10-CM | POA: Diagnosis not present

## 2018-02-24 DIAGNOSIS — H25811 Combined forms of age-related cataract, right eye: Secondary | ICD-10-CM | POA: Diagnosis not present

## 2018-03-02 DIAGNOSIS — H43393 Other vitreous opacities, bilateral: Secondary | ICD-10-CM | POA: Diagnosis not present

## 2018-03-02 DIAGNOSIS — Z961 Presence of intraocular lens: Secondary | ICD-10-CM | POA: Diagnosis not present

## 2018-03-02 DIAGNOSIS — E119 Type 2 diabetes mellitus without complications: Secondary | ICD-10-CM | POA: Diagnosis not present

## 2018-03-02 DIAGNOSIS — Z7984 Long term (current) use of oral hypoglycemic drugs: Secondary | ICD-10-CM | POA: Diagnosis not present

## 2018-03-03 DIAGNOSIS — Z79899 Other long term (current) drug therapy: Secondary | ICD-10-CM | POA: Diagnosis not present

## 2018-03-03 DIAGNOSIS — M5441 Lumbago with sciatica, right side: Secondary | ICD-10-CM | POA: Diagnosis not present

## 2018-03-03 DIAGNOSIS — G8929 Other chronic pain: Secondary | ICD-10-CM | POA: Diagnosis not present

## 2018-03-03 DIAGNOSIS — Z6823 Body mass index (BMI) 23.0-23.9, adult: Secondary | ICD-10-CM | POA: Diagnosis not present

## 2018-03-03 DIAGNOSIS — M545 Low back pain: Secondary | ICD-10-CM | POA: Diagnosis not present

## 2018-03-04 ENCOUNTER — Other Ambulatory Visit: Payer: Self-pay | Admitting: Nurse Practitioner

## 2018-03-04 DIAGNOSIS — F419 Anxiety disorder, unspecified: Secondary | ICD-10-CM

## 2018-03-04 NOTE — Telephone Encounter (Signed)
Last seen 02/09/18  MMM

## 2018-03-12 DIAGNOSIS — J188 Other pneumonia, unspecified organism: Secondary | ICD-10-CM | POA: Diagnosis not present

## 2018-03-12 DIAGNOSIS — J449 Chronic obstructive pulmonary disease, unspecified: Secondary | ICD-10-CM | POA: Diagnosis not present

## 2018-03-12 DIAGNOSIS — J441 Chronic obstructive pulmonary disease with (acute) exacerbation: Secondary | ICD-10-CM | POA: Diagnosis not present

## 2018-03-17 DIAGNOSIS — M79676 Pain in unspecified toe(s): Secondary | ICD-10-CM | POA: Diagnosis not present

## 2018-03-17 DIAGNOSIS — B351 Tinea unguium: Secondary | ICD-10-CM | POA: Diagnosis not present

## 2018-03-17 DIAGNOSIS — L84 Corns and callosities: Secondary | ICD-10-CM | POA: Diagnosis not present

## 2018-03-17 DIAGNOSIS — E1142 Type 2 diabetes mellitus with diabetic polyneuropathy: Secondary | ICD-10-CM | POA: Diagnosis not present

## 2018-03-17 LAB — CUP PACEART REMOTE DEVICE CHECK
Battery Remaining Longevity: 134 mo
Date Time Interrogation Session: 20190710180939
Implantable Lead Implant Date: 20120323
Implantable Pulse Generator Implant Date: 20120323
Lead Channel Impedance Value: 750 Ohm
Lead Channel Pacing Threshold Amplitude: 0.75 V
Lead Channel Pacing Threshold Pulse Width: 0.5 ms
Lead Channel Setting Sensing Sensitivity: 2 mV
MDC IDC LEAD LOCATION: 753860
MDC IDC MSMT BATTERY REMAINING PERCENTAGE: 91 %
MDC IDC MSMT BATTERY VOLTAGE: 2.95 V
MDC IDC MSMT LEADCHNL RV SENSING INTR AMPL: 12 mV
MDC IDC PG SERIAL: 7199163
MDC IDC SET LEADCHNL RV PACING AMPLITUDE: 2.5 V
MDC IDC SET LEADCHNL RV PACING PULSEWIDTH: 0.5 ms
MDC IDC STAT BRADY RV PERCENT PACED: 8.2 %
Pulse Gen Model: 1110

## 2018-03-24 ENCOUNTER — Encounter: Payer: Self-pay | Admitting: Nurse Practitioner

## 2018-03-24 ENCOUNTER — Ambulatory Visit (INDEPENDENT_AMBULATORY_CARE_PROVIDER_SITE_OTHER): Payer: Medicare HMO | Admitting: Nurse Practitioner

## 2018-03-24 ENCOUNTER — Telehealth: Payer: Self-pay | Admitting: *Deleted

## 2018-03-24 VITALS — BP 117/70 | HR 64 | Temp 97.3°F | Ht 62.0 in | Wt 124.0 lb

## 2018-03-24 DIAGNOSIS — I482 Chronic atrial fibrillation: Secondary | ICD-10-CM | POA: Diagnosis not present

## 2018-03-24 DIAGNOSIS — Z7901 Long term (current) use of anticoagulants: Secondary | ICD-10-CM | POA: Diagnosis not present

## 2018-03-24 DIAGNOSIS — I4821 Permanent atrial fibrillation: Secondary | ICD-10-CM

## 2018-03-24 LAB — COAGUCHEK XS/INR WAIVED
INR: 2.3 — ABNORMAL HIGH (ref 0.9–1.1)
PROTHROMBIN TIME: 27.5 s

## 2018-03-24 NOTE — Progress Notes (Signed)
Subjective:   CHIEF COMPLAINT: INR recheck   Indication: atrial fibrillation Bleeding signs/symptoms: None Thromboembolic signs/symptoms: None  Missed Coumadin doses: None Medication changes: no Dietary changes: no Bacterial/viral infection: no Other concerns: no  The following portions of the patient's history were reviewed and updated as appropriate: allergies, current medications, past family history, past medical history, past social history, past surgical history and problem list.  Review of Systems Pertinent items noted in HPI and remainder of comprehensive ROS otherwise negative.   Objective:    INR Today: 2.3 Current dose: coumadin 2mg  1/2 tablet daily except 1 tablet on fridays   (calendar was in correct) Assessment:    Therapeutic INR for goal of 2-3   Plan:    1. New dose: no change   2. Next INR: 1 month    [name], FNP

## 2018-03-24 NOTE — Telephone Encounter (Signed)
Pharmacist, community submitted for Prolia. Patient is dual eligible so most likely will be $0 out of pocket if purchased from the specialty pharmacy. Patient aware that we are working on this. Should hear back from the verification in 2 to 5 days and then we can proceed with the order and schedule her to come in for the injection. Last injection was given on 09/13/2017 and was well tolerated. She was archived in [employer] and the last verification was listed as 2016, which is unusual since she just had a dose 6 months ago.

## 2018-03-30 DIAGNOSIS — Z6822 Body mass index (BMI) 22.0-22.9, adult: Secondary | ICD-10-CM | POA: Diagnosis not present

## 2018-03-30 DIAGNOSIS — M75102 Unspecified rotator cuff tear or rupture of left shoulder, not specified as traumatic: Secondary | ICD-10-CM | POA: Diagnosis not present

## 2018-03-30 DIAGNOSIS — Z79899 Other long term (current) drug therapy: Secondary | ICD-10-CM | POA: Diagnosis not present

## 2018-03-30 MED ORDER — DENOSUMAB 60 MG/ML ~~LOC~~ SOSY: 60.0000 mg | PREFILLED_SYRINGE | Freq: Once | SUBCUTANEOUS | 0 refills | Status: DC

## 2018-03-30 NOTE — Telephone Encounter (Signed)
Summary of benefits received. No out of pocket cost to patient since she is dual eligible. Order sent to [facility]. They will contact the patient first and then our office to schedule delivery. We will call patient to schedule the injection once the medication comes in.

## 2018-03-31 ENCOUNTER — Telehealth: Payer: Self-pay | Admitting: *Deleted

## 2018-03-31 NOTE — Telephone Encounter (Signed)
Pt due for Prolia Most recent Calcium level was low Will come in for repeat labs before Prolia injection Order entered in Epic

## 2018-03-31 NOTE — Telephone Encounter (Signed)
appt scheduled for Prolia inj

## 2018-04-01 ENCOUNTER — Other Ambulatory Visit: Payer: Medicare HMO

## 2018-04-01 DIAGNOSIS — H52229 Regular astigmatism, unspecified eye: Secondary | ICD-10-CM | POA: Diagnosis not present

## 2018-04-01 DIAGNOSIS — Z0100 Encounter for examination of eyes and vision without abnormal findings: Secondary | ICD-10-CM | POA: Diagnosis not present

## 2018-04-02 LAB — BMP8+EGFR
BUN / CREAT RATIO: 11 — AB (ref 12–28)
BUN: 20 mg/dL (ref 8–27)
CHLORIDE: 101 mmol/L (ref 96–106)
CO2: 23 mmol/L (ref 20–29)
Calcium: 10.1 mg/dL (ref 8.7–10.3)
Creatinine, Ser: 1.75 mg/dL — ABNORMAL HIGH (ref 0.57–1.00)
GFR calc Af Amer: 31 mL/min/{1.73_m2} — ABNORMAL LOW (ref >59)
GFR calc non Af Amer: 27 mL/min/{1.73_m2} — ABNORMAL LOW (ref >59)
GLUCOSE: 124 mg/dL — AB (ref 65–99)
POTASSIUM: 3.9 mmol/L (ref 3.5–5.2)
SODIUM: 143 mmol/L (ref 134–144)

## 2018-04-05 ENCOUNTER — Other Ambulatory Visit: Payer: Self-pay | Admitting: Nurse Practitioner

## 2018-04-05 DIAGNOSIS — E876 Hypokalemia: Secondary | ICD-10-CM

## 2018-04-06 DIAGNOSIS — K838 Other specified diseases of biliary tract: Secondary | ICD-10-CM | POA: Diagnosis not present

## 2018-04-06 DIAGNOSIS — R109 Unspecified abdominal pain: Secondary | ICD-10-CM | POA: Diagnosis not present

## 2018-04-06 DIAGNOSIS — R634 Abnormal weight loss: Secondary | ICD-10-CM | POA: Diagnosis not present

## 2018-04-12 DIAGNOSIS — J449 Chronic obstructive pulmonary disease, unspecified: Secondary | ICD-10-CM | POA: Diagnosis not present

## 2018-04-12 DIAGNOSIS — J188 Other pneumonia, unspecified organism: Secondary | ICD-10-CM | POA: Diagnosis not present

## 2018-04-12 DIAGNOSIS — J441 Chronic obstructive pulmonary disease with (acute) exacerbation: Secondary | ICD-10-CM | POA: Diagnosis not present

## 2018-04-15 ENCOUNTER — Other Ambulatory Visit: Payer: Medicare HMO

## 2018-04-16 LAB — BMP8+EGFR
BUN/Creatinine Ratio: 11 — ABNORMAL LOW (ref 12–28)
BUN: 20 mg/dL (ref 8–27)
CALCIUM: 10.1 mg/dL (ref 8.7–10.3)
CO2: 22 mmol/L (ref 20–29)
CREATININE: 1.76 mg/dL — AB (ref 0.57–1.00)
Chloride: 101 mmol/L (ref 96–106)
GFR, EST AFRICAN AMERICAN: 31 mL/min/{1.73_m2} — AB (ref >59)
GFR, EST NON AFRICAN AMERICAN: 27 mL/min/{1.73_m2} — AB (ref >59)
Glucose: 139 mg/dL — ABNORMAL HIGH (ref 65–99)
Potassium: 3.8 mmol/L (ref 3.5–5.2)
Sodium: 140 mmol/L (ref 134–144)

## 2018-04-20 ENCOUNTER — Telehealth: Payer: Self-pay | Admitting: Nurse Practitioner

## 2018-04-20 NOTE — Telephone Encounter (Signed)
Call to pt to inform we have Prolia Pt will get inj at [facility] with MMM

## 2018-04-20 NOTE — Telephone Encounter (Signed)
Patient called asking if prolia has been ordered

## 2018-04-26 ENCOUNTER — Encounter: Payer: Self-pay | Admitting: Nurse Practitioner

## 2018-04-26 ENCOUNTER — Ambulatory Visit (INDEPENDENT_AMBULATORY_CARE_PROVIDER_SITE_OTHER): Payer: Medicare HMO | Admitting: Nurse Practitioner

## 2018-04-26 VITALS — BP 111/58 | HR 62 | Temp 97.0°F | Ht 62.0 in | Wt 125.0 lb

## 2018-04-26 DIAGNOSIS — I2510 Atherosclerotic heart disease of native coronary artery without angina pectoris: Secondary | ICD-10-CM | POA: Diagnosis not present

## 2018-04-26 DIAGNOSIS — I482 Chronic atrial fibrillation: Secondary | ICD-10-CM

## 2018-04-26 DIAGNOSIS — I5033 Acute on chronic diastolic (congestive) heart failure: Secondary | ICD-10-CM

## 2018-04-26 DIAGNOSIS — E1121 Type 2 diabetes mellitus with diabetic nephropathy: Secondary | ICD-10-CM | POA: Diagnosis not present

## 2018-04-26 DIAGNOSIS — E785 Hyperlipidemia, unspecified: Secondary | ICD-10-CM

## 2018-04-26 DIAGNOSIS — K439 Ventral hernia without obstruction or gangrene: Secondary | ICD-10-CM

## 2018-04-26 DIAGNOSIS — J449 Chronic obstructive pulmonary disease, unspecified: Secondary | ICD-10-CM | POA: Diagnosis not present

## 2018-04-26 DIAGNOSIS — I4821 Permanent atrial fibrillation: Secondary | ICD-10-CM

## 2018-04-26 DIAGNOSIS — E559 Vitamin D deficiency, unspecified: Secondary | ICD-10-CM

## 2018-04-26 DIAGNOSIS — K219 Gastro-esophageal reflux disease without esophagitis: Secondary | ICD-10-CM

## 2018-04-26 DIAGNOSIS — N184 Chronic kidney disease, stage 4 (severe): Secondary | ICD-10-CM

## 2018-04-26 DIAGNOSIS — M810 Age-related osteoporosis without current pathological fracture: Secondary | ICD-10-CM

## 2018-04-26 DIAGNOSIS — Z950 Presence of cardiac pacemaker: Secondary | ICD-10-CM

## 2018-04-26 DIAGNOSIS — R5382 Chronic fatigue, unspecified: Secondary | ICD-10-CM | POA: Diagnosis not present

## 2018-04-26 DIAGNOSIS — I10 Essential (primary) hypertension: Secondary | ICD-10-CM

## 2018-04-26 DIAGNOSIS — Z720 Tobacco use: Secondary | ICD-10-CM

## 2018-04-26 DIAGNOSIS — F419 Anxiety disorder, unspecified: Secondary | ICD-10-CM

## 2018-04-26 LAB — COAGUCHEK XS/INR WAIVED
INR: 2.8 — AB (ref 0.9–1.1)
Prothrombin Time: 33.9 s

## 2018-04-26 LAB — BAYER DCA HB A1C WAIVED: HB A1C (BAYER DCA - WAIVED): 5.2 % (ref <7.0)

## 2018-04-26 MED ORDER — DILTIAZEM HCL 120 MG PO TABS: 120.0000 mg | ORAL_TABLET | Freq: Every day | ORAL | 1 refills | Status: DC

## 2018-04-26 MED ORDER — DENOSUMAB 60 MG/ML ~~LOC~~ SOSY
60.0000 mg | PREFILLED_SYRINGE | Freq: Once | SUBCUTANEOUS | Status: AC
  Administered 2018-04-26: 60 mg via SUBCUTANEOUS

## 2018-04-26 MED ORDER — CLONAZEPAM 0.5 MG PO TABS: 0.5000 mg | ORAL_TABLET | Freq: Three times a day (TID) | ORAL | 2 refills | Status: DC | PRN

## 2018-04-26 MED ORDER — SITAGLIPTIN PHOSPHATE 50 MG PO TABS: 50.0000 mg | ORAL_TABLET | Freq: Every day | ORAL | 1 refills | Status: DC

## 2018-04-26 MED ORDER — WARFARIN SODIUM 2 MG PO TABS: 1.0000 mg | ORAL_TABLET | Freq: Every day | ORAL | 0 refills | Status: DC

## 2018-04-26 NOTE — Progress Notes (Signed)
Pt given Prolia inj in R arm Tolerated well This was pt supplied

## 2018-04-26 NOTE — Addendum Note (Signed)
Addended by: Marin Olp on: 04/26/2018 04:38 PM   Modules accepted: Orders

## 2018-04-26 NOTE — Progress Notes (Signed)
Subjective:    Patient ID: [name], female    DOB: 1934-11-17, 83 y.o.   MRN: [id number]   Chief Complaint: Medical management of chronic issues  HPI:  1. Acute on chronic diastolic CHF (congestive heart failure) (HCC) pateint has occassional lower ext edema when she sits a lot with knee bent. Swelling will resolve by morning.   2. Essential hypertension  No c/o chest pain, sob or headache. Does not check blood pressure at home. BP Readings from Last 3 Encounters:  03/24/18 117/70  02/09/18 (!) 108/53  12/13/17 118/60     3. Permanent atrial fibrillation (HCC)  Feels no palpitations- is still on coumadin  4. Coronary artery disease involving native coronary artery of native heart without angina pectoris last saw cardiologist on 02/16/18. They checked her pacemaker and it is working well. No changes were made to care.  5. COPD GOLD O   Uses symbicort daily and duoneb as needed. She has not had any breathing problems as of late but she does still smoke.  6. Gastroesophageal reflux disease without esophagitis  Is on protonix daily- works well to keep symptoms under control. Has very poor appetie. She is seeing GI and is scheduled for gastroscopy on septeber 26,2019.  7. Type 2 diabetes mellitus with diabetic nephropathy, without long-term current use of insulin ([place]) last hgba1c was 4.8%. Sh ehas not been checking blood sugars at home. No hypoglycemia that she is aware of.  8. Osteoporosis, post-menopausal  Last dexascan was done 11/01/16. t score -2.2. She does very little weight bearing exercise. walks with walker  9. Chronic kidney disease (CKD), stage IV (severe) (HCC)  We re currently just watching labs. She denies any problems urinationg  10. Anxiety  Stays anxious all the time. Takes klonopin 3x a day- gets very anxious is she does not take.  11. Abdominal wall hernia  Does not bother her at all  12. Tobacco abuse  Has mo desire  To quit  13. Vitamin D deficiency    Does not always take supplement  14. Hyperlipidemia with target LDL less than 100  Does not watch diet at all. Has poor appetie so eats what ever he wants to.    Indication: atrial fibrillation Bleeding signs/symptoms: None Thromboembolic signs/symptoms: None  Missed Coumadin doses: None Medication changes: no Dietary changes: no Bacterial/viral infection: no Other concerns: no    Outpatient Encounter Medications as of 04/26/2018  Medication Sig  . acetaminophen (TYLENOL) 500 MG tablet Take 1,000 mg by mouth every 6 (six) hours as needed for mild pain, moderate pain or fever.   . Blood Glucose Calibration (TRUE METRIX LEVEL 1) LOW SOLN Use weekly  . Blood Glucose Monitoring Suppl (TRUE METRIX AIR GLUCOSE METER) DEVI 1 Device by Does not apply route 2 (two) times daily. Use to Test blood sugar bid. DX E13.10  . calcium-vitamin D 250-100 MG-UNIT tablet Take 2 tablets by mouth daily.  . clonazePAM (KLONOPIN) 0.5 MG tablet TAKE ONE (1) TABLET THREE (3) TIMES EACH DAY  . CREON 36000 units CPEP capsule TAKE 3 CAPSULES WITH MEALS  AND TAKE 1 CAPSULE WITH A SNACK AS DIRECTED  . diltiazem (CARDIZEM) 120 MG tablet Take 1 tablet (120 mg total) by mouth daily.  . ferrous sulfate 325 (65 FE) MG tablet Take 1 tablet (325 mg total) by mouth daily with breakfast.  . guaiFENesin (MUCINEX) 600 MG 12 hr tablet Take 1 tablet (600 mg total) by mouth 2 (two) times daily.  [name]  HYDROcodone-acetaminophen (NORCO) 7.5-325 MG tablet Take 1 tablet by mouth 3 (three) times daily as needed (pain).   [name] ipratropium-albuterol (DUONEB) 0.5-2.5 (3) MG/3ML SOLN INHALE THE CONTENTS OF 1 VIAL VIA NEBULIZER EVERY 4 HOURS AS NEEDED  . nitroGLYCERIN (NITROSTAT) 0.4 MG SL tablet Place 1 tablet (0.4 mg total) under the tongue every 5 (five) minutes as needed for chest pain.  . OXYGEN Inhale 2 L into the lungs at bedtime. 2lpm with sleep only   . pantoprazole (PROTONIX) 40 MG tablet TAKE 1 TABLET TWICE DAILY  . potassium chloride  SA (K-DUR,KLOR-CON) 20 MEQ tablet TAKE 1 TABLET EVERY MORNING.  . Probiotic Product ([place]) CAPS Take 1 capsule by mouth daily.  . simvastatin (ZOCOR) 10 MG tablet TAKE 1 TABLET EVERY DAY  . sitaGLIPtin (JANUVIA) 50 MG tablet Take 1 tablet (50 mg total) by mouth daily.  . SYMBICORT 160-4.5 MCG/ACT inhaler INHALE 2 PUFFS TWICE DAILY  . TRUE METRIX BLOOD GLUCOSE TEST test strip TEST TWO TIMES DAILY AS DIRECTED  . TRUEPLUS LANCETS 28G MISC TEST BLOOD SUGAR TWICE DAILY  . VENTOLIN HFA 108 (90 Base) MCG/ACT inhaler USE 2 PUFFS EVERY 6 HOURS AS NEEDED  . warfarin (COUMADIN) 2 MG tablet TAKE PER PHYSICIAN INSTRUCTIONS     New complaints: Just stays tired all the time  Social history: [place] granddaughter lives with her right now. She is pregnanat   Review of Systems  Constitutional: Negative for activity change and appetite change.  HENT: Negative.   Eyes: Negative for pain.  Respiratory: Negative for shortness of breath.   Cardiovascular: Negative for chest pain, palpitations and leg swelling.  Gastrointestinal: Negative for abdominal pain.  Endocrine: Negative for polydipsia.  Genitourinary: Negative.   Skin: Negative for rash.  Neurological: Negative for dizziness, weakness and headaches.  Hematological: Does not bruise/bleed easily.  Psychiatric/Behavioral: Negative.   All other systems reviewed and are negative.      Objective:   Physical Exam  Constitutional: She is oriented to person, place, and time. She appears well-developed and well-nourished. No distress.  HENT:  Head: Normocephalic.  Nose: Nose normal.  Mouth/Throat: Oropharynx is clear and moist.  Eyes: Pupils are equal, round, and reactive to light. EOM are normal.  Neck: Normal range of motion. Neck supple. No JVD present. Carotid bruit is not present.  Cardiovascular: Normal rate, regular rhythm, normal heart sounds and intact distal pulses.  Pulmonary/Chest: Effort normal and breath sounds  normal. No respiratory distress. She has no wheezes. She has no rales. She exhibits no tenderness.  Abdominal: Soft. Normal appearance, normal aorta and bowel sounds are normal. She exhibits no distension, no abdominal bruit, no pulsatile midline mass and no mass. There is no splenomegaly or hepatomegaly. There is no tenderness.  Musculoskeletal: Normal range of motion. She exhibits no edema.  Walking with walker  Lymphadenopathy:    She has no cervical adenopathy.  Neurological: She is alert and oriented to person, place, and time. She has normal reflexes.  Skin: Skin is warm and dry. Capillary refill takes 2 to 3 seconds.  Blacken discoloration of bil lower exct  Psychiatric: She has a normal mood and affect. Her behavior is normal. Judgment and thought content normal.  Nursing note and vitals reviewed.  BP (!) 111/58   Pulse 62   Temp (!) 97 F (36.1 C) (Oral)   Ht 5' 2"  (1.575 m)   Wt 125 lb (56.7 kg)   BMI 22.86 kg/m   INR 2.8 today  Assessment & Plan:  Cadance Raus comes in today with chief complaint of No chief complaint on file.   Diagnosis and orders addressed:  1. Acute on chronic diastolic CHF (congestive heart failure) ([place]) Keep follow up appointment with cardiology - diltiazem (CARDIZEM) 120 MG tablet; Take 1 tablet (120 mg total) by mouth daily.  Dispense: 90 tablet; Refill: 1  2. Essential hypertension Low sodium diet - CMP14+EGFR  3. Permanent atrial fibrillation ([place]) Have pacemaker checked daily - CoaguChek XS/INR Waived  4. Coronary artery disease involving native coronary artery of native heart without angina pectoris  5. COPD GOLD O  Smoking cessation encouraged Continue inhalers daily  6. Gastroesophageal reflux disease without esophagitis Avoid spicy foods Do not eat 2 hours prior to bedtime Let me know results of gastroscopy  7. Type 2 diabetes mellitus with diabetic nephropathy, without long-term current use of insulin  (HCC) Continue to watch carbs in diet - Bayer DCA Hb A1c Waived - sitaGLIPtin (JANUVIA) 50 MG tablet; Take 1 tablet (50 mg total) by mouth daily.  Dispense: 90 tablet; Refill: 1  8. Osteoporosis, post-menopausal Weight bearing exercise if can tolerate  9. Chronic kidney disease (CKD), stage IV (severe) ([place]) Labs pending  10. Anxiety stres management - clonazePAM (KLONOPIN) 0.5 MG tablet; Take 1 tablet (0.5 mg total) by mouth 3 (three) times daily as needed for anxiety.  Dispense: 90 tablet; Refill: 2  11. Abdominal wall hernia  12. Tobacco abuse Stop smoking  13. Vitamin D deficiency Daily vitamin d supplement  14. Hyperlipidemia with target LDL less than 100 Low fat diet - Lipid panel  15. Pacemaker-St.Jude - warfarin (COUMADIN) 2 MG tablet; Take 0.5 tablets (1 mg total) by mouth daily at 6 PM.  Dispense: 135 tablet; Refill: 0  16. Chronic fatigue Labs pending - CBC with Differential/Platelet   Labs pending Health Maintenance reviewed Diet and exercise encouraged  Follow up plan: 3 months   [place], FNP

## 2018-04-26 NOTE — Patient Instructions (Signed)

## 2018-04-27 LAB — LIPID PANEL
CHOLESTEROL TOTAL: 109 mg/dL (ref 100–199)
Chol/HDL Ratio: 3.5 ratio (ref 0.0–4.4)
HDL: 31 mg/dL — ABNORMAL LOW (ref >39)
LDL Calculated: 56 mg/dL (ref 0–99)
TRIGLYCERIDES: 110 mg/dL (ref 0–149)
VLDL Cholesterol Cal: 22 mg/dL (ref 5–40)

## 2018-04-27 LAB — CMP14+EGFR
ALK PHOS: 82 IU/L (ref 39–117)
ALT: 13 IU/L (ref 0–32)
AST: 26 IU/L (ref 0–40)
Albumin/Globulin Ratio: 1.4 (ref 1.2–2.2)
Albumin: 3.8 g/dL (ref 3.5–4.7)
BUN/Creatinine Ratio: 12 (ref 12–28)
BUN: 22 mg/dL (ref 8–27)
Bilirubin Total: 0.4 mg/dL (ref 0.0–1.2)
CO2: 25 mmol/L (ref 20–29)
CREATININE: 1.79 mg/dL — AB (ref 0.57–1.00)
Calcium: 10.1 mg/dL (ref 8.7–10.3)
Chloride: 101 mmol/L (ref 96–106)
GFR calc Af Amer: 30 mL/min/{1.73_m2} — ABNORMAL LOW (ref >59)
GFR calc non Af Amer: 26 mL/min/{1.73_m2} — ABNORMAL LOW (ref >59)
GLOBULIN, TOTAL: 2.8 g/dL (ref 1.5–4.5)
Glucose: 118 mg/dL — ABNORMAL HIGH (ref 65–99)
Potassium: 4.1 mmol/L (ref 3.5–5.2)
SODIUM: 142 mmol/L (ref 134–144)
Total Protein: 6.6 g/dL (ref 6.0–8.5)

## 2018-04-27 LAB — CBC WITH DIFFERENTIAL/PLATELET
BASOS ABS: 0 10*3/uL (ref 0.0–0.2)
Basos: 0 %
EOS (ABSOLUTE): 0.1 10*3/uL (ref 0.0–0.4)
Eos: 2 %
Hematocrit: 31.7 % — ABNORMAL LOW (ref 34.0–46.6)
Hemoglobin: 10.2 g/dL — ABNORMAL LOW (ref 11.1–15.9)
Immature Grans (Abs): 0 10*3/uL (ref 0.0–0.1)
Immature Granulocytes: 0 %
LYMPHS ABS: 2.5 10*3/uL (ref 0.7–3.1)
Lymphs: 38 %
MCH: 29.2 pg (ref 26.6–33.0)
MCHC: 32.2 g/dL (ref 31.5–35.7)
MCV: 91 fL (ref 79–97)
MONOCYTES: 7 %
Monocytes Absolute: 0.5 10*3/uL (ref 0.1–0.9)
Neutrophils Absolute: 3.6 10*3/uL (ref 1.4–7.0)
Neutrophils: 53 %
Platelets: 209 10*3/uL (ref 150–450)
RBC: 3.49 x10E6/uL — AB (ref 3.77–5.28)
RDW: 14.7 % (ref 12.3–15.4)
WBC: 6.7 10*3/uL (ref 3.4–10.8)

## 2018-04-28 DIAGNOSIS — M5441 Lumbago with sciatica, right side: Secondary | ICD-10-CM | POA: Diagnosis not present

## 2018-04-28 DIAGNOSIS — Z79899 Other long term (current) drug therapy: Secondary | ICD-10-CM | POA: Diagnosis not present

## 2018-04-28 DIAGNOSIS — Z6823 Body mass index (BMI) 23.0-23.9, adult: Secondary | ICD-10-CM | POA: Diagnosis not present

## 2018-04-28 DIAGNOSIS — I4891 Unspecified atrial fibrillation: Secondary | ICD-10-CM | POA: Diagnosis not present

## 2018-04-28 DIAGNOSIS — Z01810 Encounter for preprocedural cardiovascular examination: Secondary | ICD-10-CM | POA: Diagnosis not present

## 2018-04-28 DIAGNOSIS — G8929 Other chronic pain: Secondary | ICD-10-CM | POA: Diagnosis not present

## 2018-04-28 DIAGNOSIS — M75102 Unspecified rotator cuff tear or rupture of left shoulder, not specified as traumatic: Secondary | ICD-10-CM | POA: Diagnosis not present

## 2018-05-05 DIAGNOSIS — K838 Other specified diseases of biliary tract: Secondary | ICD-10-CM | POA: Diagnosis not present

## 2018-05-05 DIAGNOSIS — J449 Chronic obstructive pulmonary disease, unspecified: Secondary | ICD-10-CM | POA: Diagnosis not present

## 2018-05-05 DIAGNOSIS — N182 Chronic kidney disease, stage 2 (mild): Secondary | ICD-10-CM | POA: Diagnosis not present

## 2018-05-05 DIAGNOSIS — K828 Other specified diseases of gallbladder: Secondary | ICD-10-CM | POA: Diagnosis not present

## 2018-05-05 DIAGNOSIS — E1122 Type 2 diabetes mellitus with diabetic chronic kidney disease: Secondary | ICD-10-CM | POA: Diagnosis not present

## 2018-05-05 DIAGNOSIS — I2510 Atherosclerotic heart disease of native coronary artery without angina pectoris: Secondary | ICD-10-CM | POA: Diagnosis not present

## 2018-05-05 DIAGNOSIS — Z9049 Acquired absence of other specified parts of digestive tract: Secondary | ICD-10-CM | POA: Diagnosis not present

## 2018-05-05 DIAGNOSIS — I130 Hypertensive heart and chronic kidney disease with heart failure and stage 1 through stage 4 chronic kidney disease, or unspecified chronic kidney disease: Secondary | ICD-10-CM | POA: Diagnosis not present

## 2018-05-05 DIAGNOSIS — M25512 Pain in left shoulder: Secondary | ICD-10-CM | POA: Diagnosis not present

## 2018-05-05 DIAGNOSIS — F17210 Nicotine dependence, cigarettes, uncomplicated: Secondary | ICD-10-CM | POA: Diagnosis not present

## 2018-05-05 DIAGNOSIS — R933 Abnormal findings on diagnostic imaging of other parts of digestive tract: Secondary | ICD-10-CM | POA: Diagnosis not present

## 2018-05-05 DIAGNOSIS — I509 Heart failure, unspecified: Secondary | ICD-10-CM | POA: Diagnosis not present

## 2018-05-06 ENCOUNTER — Encounter: Payer: Medicare HMO | Admitting: Internal Medicine

## 2018-05-12 DIAGNOSIS — J188 Other pneumonia, unspecified organism: Secondary | ICD-10-CM | POA: Diagnosis not present

## 2018-05-12 DIAGNOSIS — J441 Chronic obstructive pulmonary disease with (acute) exacerbation: Secondary | ICD-10-CM | POA: Diagnosis not present

## 2018-05-12 DIAGNOSIS — J449 Chronic obstructive pulmonary disease, unspecified: Secondary | ICD-10-CM | POA: Diagnosis not present

## 2018-05-18 ENCOUNTER — Ambulatory Visit (INDEPENDENT_AMBULATORY_CARE_PROVIDER_SITE_OTHER): Payer: Medicare HMO | Admitting: *Deleted

## 2018-05-18 DIAGNOSIS — I495 Sick sinus syndrome: Secondary | ICD-10-CM

## 2018-05-19 NOTE — Progress Notes (Signed)
Remote pacemaker transmission.   

## 2018-05-24 ENCOUNTER — Other Ambulatory Visit: Payer: Self-pay | Admitting: Nurse Practitioner

## 2018-05-25 NOTE — Telephone Encounter (Signed)
OV 05/30/18

## 2018-05-26 ENCOUNTER — Ambulatory Visit: Payer: Medicare HMO | Admitting: Cardiovascular Disease

## 2018-05-26 DIAGNOSIS — G8929 Other chronic pain: Secondary | ICD-10-CM | POA: Diagnosis not present

## 2018-05-26 DIAGNOSIS — M5441 Lumbago with sciatica, right side: Secondary | ICD-10-CM | POA: Diagnosis not present

## 2018-05-26 DIAGNOSIS — Z79899 Other long term (current) drug therapy: Secondary | ICD-10-CM | POA: Diagnosis not present

## 2018-05-26 DIAGNOSIS — M75102 Unspecified rotator cuff tear or rupture of left shoulder, not specified as traumatic: Secondary | ICD-10-CM | POA: Diagnosis not present

## 2018-05-30 ENCOUNTER — Ambulatory Visit (INDEPENDENT_AMBULATORY_CARE_PROVIDER_SITE_OTHER): Payer: Medicare HMO | Admitting: Nurse Practitioner

## 2018-05-30 ENCOUNTER — Encounter: Payer: Self-pay | Admitting: Nurse Practitioner

## 2018-05-30 VITALS — BP 104/54 | HR 62 | Temp 98.2°F | Ht 62.0 in | Wt 123.0 lb

## 2018-05-30 DIAGNOSIS — I4821 Permanent atrial fibrillation: Secondary | ICD-10-CM | POA: Diagnosis not present

## 2018-05-30 LAB — COAGUCHEK XS/INR WAIVED
INR: 3.7 — ABNORMAL HIGH (ref 0.9–1.1)
Prothrombin Time: 45 s

## 2018-05-30 NOTE — Progress Notes (Signed)
Subjective:   CHIEF COMPLAINT: INR recheck   Indication: atrial fibrillation Bleeding signs/symptoms: None Thromboembolic signs/symptoms: None  Missed Coumadin doses: None Medication changes: no Dietary changes: no Bacterial/viral infection: no Other concerns: no  The following portions of the patient's history were reviewed and updated as appropriate: allergies, current medications, past family history, past medical history, past social history, past surgical history and problem list.  Review of Systems Pertinent items noted in HPI and remainder of comprehensive ROS otherwise negative.   Objective:    INR Today: 3.7 Current dose: coumadin 1mg  daily except 2mg  on friday    Assessment:    Supratherapeutic INR for goal of 2-3   Plan:    1. New dose: hold today then coumadin 1mg  daily    2. Next INR: 2 weeks    [name], FNP

## 2018-06-10 ENCOUNTER — Ambulatory Visit (INDEPENDENT_AMBULATORY_CARE_PROVIDER_SITE_OTHER): Payer: 59 | Admitting: Internal Medicine

## 2018-06-10 ENCOUNTER — Encounter: Payer: Self-pay | Admitting: Internal Medicine

## 2018-06-10 VITALS — BP 108/67 | HR 60 | Ht 62.0 in | Wt 127.0 lb

## 2018-06-10 DIAGNOSIS — I4821 Permanent atrial fibrillation: Secondary | ICD-10-CM

## 2018-06-10 DIAGNOSIS — Z720 Tobacco use: Secondary | ICD-10-CM | POA: Diagnosis not present

## 2018-06-10 DIAGNOSIS — R001 Bradycardia, unspecified: Secondary | ICD-10-CM

## 2018-06-10 LAB — CUP PACEART INCLINIC DEVICE CHECK
Battery Remaining Longevity: 135 mo
Battery Voltage: 2.95 V
Brady Statistic RV Percent Paced: 9.5 %
Implantable Lead Implant Date: 20120323
Implantable Lead Location: 753860
Implantable Lead Model: 1948
Lead Channel Pacing Threshold Amplitude: 0.5 V
Lead Channel Pacing Threshold Pulse Width: 0.5 ms
Lead Channel Sensing Intrinsic Amplitude: 12 mV
Lead Channel Setting Pacing Amplitude: 2.5 V
Lead Channel Setting Pacing Pulse Width: 0.5 ms
Lead Channel Setting Sensing Sensitivity: 2 mV
MDC IDC MSMT LEADCHNL RV IMPEDANCE VALUE: 725 Ohm
MDC IDC MSMT LEADCHNL RV PACING THRESHOLD AMPLITUDE: 0.5 V
MDC IDC MSMT LEADCHNL RV PACING THRESHOLD PULSEWIDTH: 0.5 ms
MDC IDC PG IMPLANT DT: 20120323
MDC IDC PG SERIAL: 7199163
MDC IDC SESS DTM: 20191101153103

## 2018-06-10 MED ORDER — NITROGLYCERIN 0.4 MG SL SUBL: 0.4000 mg | SUBLINGUAL_TABLET | SUBLINGUAL | 3 refills | Status: DC | PRN

## 2018-06-10 NOTE — Progress Notes (Signed)
PCP: [name], FNP Primary Cardiologist: Dr Bronson Ing Primary EP:  Dr Rayann Heman  [name] is a 83 y.o. female who presents today for routine electrophysiology followup.  Since last being seen in our clinic, the patient reports doing reasonably well.  She has been having GI issues for which she has ongoing workup.  Today, she denies symptoms of palpitations, chest pain, shortness of breath,  lower extremity edema, dizziness, presyncope, or syncope.  The patient is otherwise without complaint today.   Past Medical History:  Diagnosis Date  . Abnormal CT of the chest    Mediastinal and hilar adenopathy followed by Dr. Koleen Nimrod in [place]  . Anemia, unspecified   . Body mass index (BMI) of 20.0-20.9 in adult FEB 2013 147 LBS  . CAD (coronary artery disease) 2011   Catheterization August 2011: mild nonobstructive coronary artery disease complicated by large left rectus sheath hematoma status post evacuation Coumadin restarted without recurrence  . Carotid artery disease ([place])    Doppler, 05/2012, 0-39% bilateral  . Cataract   . Chronic airway obstruction, not elsewhere classified   . Chronic kidney disease, stage II (mild)   . Congestive heart failure, unspecified    EF now 60%, previous nonischemic cardiomyopathy  . Diabetes mellitus   . Dilated bile duct 10/19/2011  . Diverticulitis Dec 2012  . Gastritis    By EGD  . GERD (gastroesophageal reflux disease)   . Hypercholesterolemia   . Hypertension   . Mitral regurgitation 06/27/2013  . Neurogenic sleep apnea    Uses noctural oxygen prescribed by her pulmonologi  . Pacemaker    Single chamber [place]. Jude ACCENT [phone] SN 719 04/[birth year]/1959 10/31/2010  . Permanent atrial fibrillation    H/o tachybradycardia syndrome on Coumadin anticoagulation, has pacemaker.  . Recurrent UTI   . Rheumatic heart disease   . Tachycardia-bradycardia [facility])    s/p St. Jude single chamber PPM 10/2010   . Unspecified hypertensive kidney  disease with chronic kidney disease stage I through stage IV, or unspecified(403.90)   . Valvular heart disease    a. H/o moderate mitral stenosis by cath 2011 but no mention of this on 10/2011 echo. b. 10/2011 echo: mod MR, mild AS, mild TR; EF>65%  . Vitamin D deficiency    Past Surgical History:  Procedure Laterality Date  . CHOLECYSTECTOMY     40+ years ago  . COLONOSCOPY  10/2011   Dyann Ruddle sigmoid to descending diverticulosis, internal hemorrhoids  . ESOPHAGOGASTRODUODENOSCOPY  10/2011   Fields-erosive gastritis, biopsy with no H. pylori, hiatal hernia, peptic duodenitis, no celiac disease, duodenal diverticulum, duodenal nodule  . EUS  11/16/11   Mishra-13 MM CBD, normal pancreatic duct, otherwise normal EUS  . Evacuation of retroperitoneal and left rectus sheath    . HEMATOMA EVACUATION     femoral  . HERNIA REPAIR     X3  . PILONIDAL CYST / SINUS EXCISION    . Single-chamber pacemaker implantation  3/12   by Dr Caryl Comes    ROS- all systems are reviewed and negative except as per HPI above  Current Outpatient Medications  Medication Sig Dispense Refill  . acetaminophen (TYLENOL) 500 MG tablet Take 1,000 mg by mouth every 6 (six) hours as needed for mild pain, moderate pain or fever.     . Blood Glucose Calibration (TRUE METRIX LEVEL 1) LOW SOLN Use weekly 3 each 1  . Blood Glucose Monitoring Suppl (TRUE METRIX AIR GLUCOSE METER) DEVI 1 Device by Does not apply route  2 (two) times daily. Use to Test blood sugar bid. DX E13.10 1 Device 0  . calcium-vitamin D 250-100 MG-UNIT tablet Take 2 tablets by mouth daily.    . clonazePAM (KLONOPIN) 0.5 MG tablet Take 1 tablet (0.5 mg total) by mouth 3 (three) times daily as needed for anxiety. 90 tablet 2  . CREON 36000 units CPEP capsule TAKE 3 CAPSULES WITH MEALS  AND TAKE 1 CAPSULE WITH A SNACK AS DIRECTED 900 capsule 2  . diltiazem (CARDIZEM) 120 MG tablet Take 1 tablet (120 mg total) by mouth daily. 90 tablet 1  . ferrous sulfate 325  (65 FE) MG tablet Take 1 tablet (325 mg total) by mouth daily with breakfast.  3  . guaiFENesin (MUCINEX) 600 MG 12 hr tablet Take 1 tablet (600 mg total) by mouth 2 (two) times daily. 60 tablet 2  . HYDROcodone-acetaminophen (NORCO) 7.5-325 MG tablet Take 1 tablet by mouth 3 (three) times daily as needed (pain).     [name] ipratropium-albuterol (DUONEB) 0.5-2.5 (3) MG/3ML SOLN INHALE THE CONTENTS OF 1 VIAL VIA NEBULIZER EVERY 4 HOURS AS NEEDED 1080 mL 5  . Multiple Vitamins-Minerals (PRESERVISION AREDS 2) CAPS Take 1 capsule by mouth daily.     . nitroGLYCERIN (NITROSTAT) 0.4 MG SL tablet Place 1 tablet (0.4 mg total) under the tongue every 5 (five) minutes as needed for chest pain. 25 tablet 3  . OXYGEN Inhale 2 L into the lungs at bedtime. 2lpm with sleep only     . pantoprazole (PROTONIX) 40 MG tablet TAKE 1 TABLET TWICE DAILY 180 tablet 0  . potassium chloride SA (K-DUR,KLOR-CON) 20 MEQ tablet TAKE 1 TABLET EVERY MORNING. 90 tablet 1  . Probiotic Product ([place]) CAPS Take 1 capsule by mouth daily.    . simvastatin (ZOCOR) 10 MG tablet TAKE 1 TABLET EVERY DAY 90 tablet 1  . sitaGLIPtin (JANUVIA) 50 MG tablet Take 1 tablet (50 mg total) by mouth daily. 90 tablet 1  . SYMBICORT 160-4.5 MCG/ACT inhaler INHALE 2 PUFFS TWICE DAILY 3 Inhaler 1  . TRUE METRIX BLOOD GLUCOSE TEST test strip TEST TWO TIMES DAILY AS DIRECTED 200 each 11  . TRUEPLUS LANCETS 28G MISC TEST BLOOD SUGAR TWICE DAILY 200 each 1  . VENTOLIN HFA 108 (90 Base) MCG/ACT inhaler USE 2 PUFFS EVERY 6 HOURS AS NEEDED 18 g 4  . warfarin (COUMADIN) 2 MG tablet Take 0.5 tablets (1 mg total) by mouth daily at 6 PM. 135 tablet 0   No current facility-administered medications for this visit.     Physical Exam: Vitals:   06/10/18 1215  BP: 108/67  Pulse: 60  SpO2: 96%  Weight: 127 lb (57.6 kg)  Height: 5\' 2"  (1.575 m)    GEN- The patient is well appearing, alert and oriented x 3 today.   Head- normocephalic,  atraumatic Eyes-  Sclera clear, conjunctiva pink Ears- hearing intact Oropharynx- clear Lungs- Clear to ausculation bilaterally, normal work of breathing Chest- pacemaker pocket is well healed Heart- irregular rate and rhythm, no murmurs, rubs or gallops, PMI not laterally displaced GI- soft, NT, ND, + BS Extremities- no clubbing, cyanosis, or edema  Pacemaker interrogation- reviewed in detail today,  See PACEART report   Assessment and Plan:  1. Symptomatic bradycardia  Normal pacemaker function See Pace Art report No changes today  2. Permanent afib chads2vasc score is 4.  Continue coumadin  3. CAD No ischemic symptoms No changes  4. Tobacco Cessation advised  5. Chronic diastolic  dysfunction, chronic renal insufficiency Volume is stable No changes  Merlin Return in a year  Thompson Grayer MD, [facility] 06/10/2018 12:54 PM

## 2018-06-10 NOTE — Patient Instructions (Signed)
Medication Instructions:  Continue all current medications.  Labwork: none  Testing/Procedures: none  Follow-Up: 1 year   Any Other Special Instructions Will Be Listed Below (If Applicable). Remote monitoring is used to monitor your Pacemaker of ICD from home. This monitoring reduces the number of office visits required to check your device to one time per year. It allows [place] to keep an eye on the functioning of your device to ensure it is working properly. You are scheduled for a device check from home on 08/17/18. You may send your transmission at any time that day. If you have a wireless device, the transmission will be sent automatically. After your physician reviews your transmission, you will receive a postcard with your next transmission date.  If you need a refill on your cardiac medications before your next appointment, please call your pharmacy.

## 2018-06-12 DIAGNOSIS — J449 Chronic obstructive pulmonary disease, unspecified: Secondary | ICD-10-CM | POA: Diagnosis not present

## 2018-06-12 DIAGNOSIS — J188 Other pneumonia, unspecified organism: Secondary | ICD-10-CM | POA: Diagnosis not present

## 2018-06-12 DIAGNOSIS — J441 Chronic obstructive pulmonary disease with (acute) exacerbation: Secondary | ICD-10-CM | POA: Diagnosis not present

## 2018-06-20 ENCOUNTER — Ambulatory Visit (INDEPENDENT_AMBULATORY_CARE_PROVIDER_SITE_OTHER): Payer: Medicare HMO | Admitting: Nurse Practitioner

## 2018-06-20 ENCOUNTER — Encounter: Payer: Self-pay | Admitting: Nurse Practitioner

## 2018-06-20 VITALS — BP 124/65 | HR 62 | Temp 97.7°F | Ht 62.0 in | Wt 123.0 lb

## 2018-06-20 DIAGNOSIS — Z23 Encounter for immunization: Secondary | ICD-10-CM | POA: Diagnosis not present

## 2018-06-20 DIAGNOSIS — I4821 Permanent atrial fibrillation: Secondary | ICD-10-CM

## 2018-06-20 LAB — COAGUCHEK XS/INR WAIVED
INR: 2.8 — AB (ref 0.9–1.1)
Prothrombin Time: 33 s

## 2018-06-20 NOTE — Progress Notes (Signed)
Subjective:   Chief Complaint: INR recheck   Indication: atrial fibrillation Bleeding signs/symptoms: None Thromboembolic signs/symptoms: None  Missed Coumadin doses: None Medication changes: no Dietary changes: no Bacterial/viral infection: no Other concerns: no  The following portions of the patient's history were reviewed and updated as appropriate: allergies, current medications, past family history, past medical history, past social history, past surgical history and problem list.  Review of Systems Pertinent items noted in HPI and remainder of comprehensive ROS otherwise negative.   Objective:    INR Today: 2.8 Current dose: coumadin 1mg  daily    Assessment:    Therapeutic INR for goal of 2-3   Plan:    1. New dose: no change   2. Next INR: 1 month     [name], FNP

## 2018-06-21 DIAGNOSIS — I4821 Permanent atrial fibrillation: Secondary | ICD-10-CM | POA: Diagnosis not present

## 2018-06-21 DIAGNOSIS — Z23 Encounter for immunization: Secondary | ICD-10-CM | POA: Diagnosis not present

## 2018-06-23 DIAGNOSIS — G8929 Other chronic pain: Secondary | ICD-10-CM | POA: Diagnosis not present

## 2018-06-23 DIAGNOSIS — M75102 Unspecified rotator cuff tear or rupture of left shoulder, not specified as traumatic: Secondary | ICD-10-CM | POA: Diagnosis not present

## 2018-06-23 DIAGNOSIS — Z79899 Other long term (current) drug therapy: Secondary | ICD-10-CM | POA: Diagnosis not present

## 2018-06-23 DIAGNOSIS — M5441 Lumbago with sciatica, right side: Secondary | ICD-10-CM | POA: Diagnosis not present

## 2018-06-29 ENCOUNTER — Other Ambulatory Visit: Payer: Self-pay | Admitting: Nurse Practitioner

## 2018-06-29 DIAGNOSIS — Z950 Presence of cardiac pacemaker: Secondary | ICD-10-CM

## 2018-07-11 ENCOUNTER — Other Ambulatory Visit: Payer: Self-pay | Admitting: Nurse Practitioner

## 2018-07-11 DIAGNOSIS — Z720 Tobacco use: Secondary | ICD-10-CM

## 2018-07-11 DIAGNOSIS — E785 Hyperlipidemia, unspecified: Secondary | ICD-10-CM

## 2018-07-12 DIAGNOSIS — J441 Chronic obstructive pulmonary disease with (acute) exacerbation: Secondary | ICD-10-CM | POA: Diagnosis not present

## 2018-07-12 DIAGNOSIS — J449 Chronic obstructive pulmonary disease, unspecified: Secondary | ICD-10-CM | POA: Diagnosis not present

## 2018-07-12 DIAGNOSIS — J188 Other pneumonia, unspecified organism: Secondary | ICD-10-CM | POA: Diagnosis not present

## 2018-07-14 DIAGNOSIS — M79676 Pain in unspecified toe(s): Secondary | ICD-10-CM | POA: Diagnosis not present

## 2018-07-14 DIAGNOSIS — I70203 Unspecified atherosclerosis of native arteries of extremities, bilateral legs: Secondary | ICD-10-CM | POA: Diagnosis not present

## 2018-07-14 DIAGNOSIS — B351 Tinea unguium: Secondary | ICD-10-CM | POA: Diagnosis not present

## 2018-07-14 DIAGNOSIS — L84 Corns and callosities: Secondary | ICD-10-CM | POA: Diagnosis not present

## 2018-07-18 ENCOUNTER — Ambulatory Visit: Payer: Self-pay | Admitting: *Deleted

## 2018-07-18 DIAGNOSIS — N183 Chronic kidney disease, stage 3 (moderate): Secondary | ICD-10-CM

## 2018-07-18 DIAGNOSIS — N184 Chronic kidney disease, stage 4 (severe): Secondary | ICD-10-CM

## 2018-07-18 DIAGNOSIS — Z9181 History of falling: Secondary | ICD-10-CM

## 2018-07-18 DIAGNOSIS — I5033 Acute on chronic diastolic (congestive) heart failure: Secondary | ICD-10-CM

## 2018-07-18 DIAGNOSIS — D631 Anemia in chronic kidney disease: Secondary | ICD-10-CM

## 2018-07-18 DIAGNOSIS — Z720 Tobacco use: Secondary | ICD-10-CM

## 2018-07-18 DIAGNOSIS — F419 Anxiety disorder, unspecified: Secondary | ICD-10-CM

## 2018-07-18 DIAGNOSIS — E785 Hyperlipidemia, unspecified: Secondary | ICD-10-CM

## 2018-07-18 DIAGNOSIS — I4821 Permanent atrial fibrillation: Secondary | ICD-10-CM

## 2018-07-18 DIAGNOSIS — I10 Essential (primary) hypertension: Secondary | ICD-10-CM

## 2018-07-18 DIAGNOSIS — E1121 Type 2 diabetes mellitus with diabetic nephropathy: Secondary | ICD-10-CM

## 2018-07-18 NOTE — Chronic Care Management (AMB) (Addendum)
  Chronic Care Management   Note  07/18/2018 Name: [name] MRN: [id number] DOB: [birth year]  [name] is an 83 year old female primary care patient of [name], [place] who was referred for CCM services by her health plan. She was referred for services due to CKD, hypertension, diabetes,COPD, heart failure, anemia, anxieyt, risk for falls, and medication management. I spoke with Ms Weimann by phone about the services that CCM offers. She would like to think about it and discuss it will Shelah Lewandowsky at her visit next week.   Plan I will f/u with patient by phone within one week after her visit with [name], FNP on 07/28/18.   Chong Sicilian, RN-BC, BSN Nurse Case Manager [place] Ph: [phone]  I have reviewed documnetation made by K. Hudy ,RN,BSN. I agree with plan of care. [name], FNP

## 2018-07-18 NOTE — Patient Instructions (Signed)
Ms. Bradeen was given information about Chronic Care Management services today including:  1. CCM service includes personalized support from designated clinical staff supervised by her physician, including individualized plan of care and coordination with other care providers 2. 24/7 contact phone numbers for assistance for urgent and routine care needs. 3. Service will only be billed when office clinical staff spend 20 minutes or more in a month to coordinate care. 4. Only one practitioner may furnish and bill the service in a calendar month. 5. The patient may stop CCM services at any time (effective at the end of the month) by phone call to the office staff. 6. $0 copay for this patients health plan   I will follow up with you by phone after your visit with [name], FNP next week.  You can reach out to me sooner if needed.   Chong Sicilian, RN-BC, BSN Nurse Case Manager [facility] Ph: [phone]

## 2018-07-21 DIAGNOSIS — I10 Essential (primary) hypertension: Secondary | ICD-10-CM | POA: Diagnosis not present

## 2018-07-21 DIAGNOSIS — R109 Unspecified abdominal pain: Secondary | ICD-10-CM | POA: Diagnosis not present

## 2018-07-21 DIAGNOSIS — M5441 Lumbago with sciatica, right side: Secondary | ICD-10-CM | POA: Diagnosis not present

## 2018-07-21 DIAGNOSIS — Z79899 Other long term (current) drug therapy: Secondary | ICD-10-CM | POA: Diagnosis not present

## 2018-07-21 DIAGNOSIS — G8929 Other chronic pain: Secondary | ICD-10-CM | POA: Diagnosis not present

## 2018-07-28 ENCOUNTER — Ambulatory Visit: Payer: Medicare HMO | Admitting: Nurse Practitioner

## 2018-07-29 LAB — CUP PACEART REMOTE DEVICE CHECK
Battery Remaining Longevity: 133 mo
Battery Remaining Percentage: 91 %
Date Time Interrogation Session: 20191009180945
Implantable Lead Implant Date: 20120323
Implantable Lead Model: 1948
Implantable Pulse Generator Implant Date: 20120323
Lead Channel Impedance Value: 730 Ohm
Lead Channel Pacing Threshold Amplitude: 0.75 V
Lead Channel Pacing Threshold Pulse Width: 0.5 ms
Lead Channel Setting Sensing Sensitivity: 2 mV
MDC IDC LEAD LOCATION: 753860
MDC IDC MSMT BATTERY VOLTAGE: 2.95 V
MDC IDC MSMT LEADCHNL RV SENSING INTR AMPL: 12 mV
MDC IDC SET LEADCHNL RV PACING AMPLITUDE: 2.5 V
MDC IDC SET LEADCHNL RV PACING PULSEWIDTH: 0.5 ms
MDC IDC STAT BRADY RV PERCENT PACED: 9.1 %
Pulse Gen Model: 1110
Pulse Gen Serial Number: 7199163

## 2018-08-02 ENCOUNTER — Other Ambulatory Visit: Payer: Self-pay | Admitting: Nurse Practitioner

## 2018-08-08 DIAGNOSIS — Z888 Allergy status to other drugs, medicaments and biological substances status: Secondary | ICD-10-CM | POA: Diagnosis not present

## 2018-08-08 DIAGNOSIS — Z7951 Long term (current) use of inhaled steroids: Secondary | ICD-10-CM | POA: Diagnosis not present

## 2018-08-08 DIAGNOSIS — D689 Coagulation defect, unspecified: Secondary | ICD-10-CM | POA: Diagnosis not present

## 2018-08-08 DIAGNOSIS — Z7901 Long term (current) use of anticoagulants: Secondary | ICD-10-CM | POA: Diagnosis not present

## 2018-08-08 DIAGNOSIS — Z79899 Other long term (current) drug therapy: Secondary | ICD-10-CM | POA: Diagnosis not present

## 2018-08-08 DIAGNOSIS — J441 Chronic obstructive pulmonary disease with (acute) exacerbation: Secondary | ICD-10-CM | POA: Diagnosis not present

## 2018-08-08 DIAGNOSIS — Z720 Tobacco use: Secondary | ICD-10-CM | POA: Diagnosis not present

## 2018-08-08 DIAGNOSIS — R05 Cough: Secondary | ICD-10-CM | POA: Diagnosis not present

## 2018-08-08 DIAGNOSIS — F17200 Nicotine dependence, unspecified, uncomplicated: Secondary | ICD-10-CM | POA: Diagnosis not present

## 2018-08-10 ENCOUNTER — Other Ambulatory Visit: Payer: Self-pay | Admitting: Nurse Practitioner

## 2018-08-10 DIAGNOSIS — Z720 Tobacco use: Secondary | ICD-10-CM

## 2018-08-11 ENCOUNTER — Inpatient Hospital Stay (HOSPITAL_COMMUNITY)
Admission: EM | Admit: 2018-08-11 | Discharge: 2018-08-13 | DRG: 871 | Disposition: A | Discharge status: Home / Self Care | Payer: Medicare HMO | Attending: Internal Medicine | Admitting: Internal Medicine

## 2018-08-11 ENCOUNTER — Ambulatory Visit: Payer: Medicare HMO | Admitting: Cardiovascular Disease

## 2018-08-11 ENCOUNTER — Encounter (HOSPITAL_COMMUNITY): Payer: Self-pay | Admitting: Emergency Medicine

## 2018-08-11 ENCOUNTER — Emergency Department (HOSPITAL_COMMUNITY): Payer: Medicare HMO

## 2018-08-11 ENCOUNTER — Other Ambulatory Visit: Payer: Self-pay

## 2018-08-11 DIAGNOSIS — Z823 Family history of stroke: Secondary | ICD-10-CM | POA: Diagnosis not present

## 2018-08-11 DIAGNOSIS — Z9049 Acquired absence of other specified parts of digestive tract: Secondary | ICD-10-CM

## 2018-08-11 DIAGNOSIS — E1122 Type 2 diabetes mellitus with diabetic chronic kidney disease: Secondary | ICD-10-CM | POA: Diagnosis present

## 2018-08-11 DIAGNOSIS — J449 Chronic obstructive pulmonary disease, unspecified: Secondary | ICD-10-CM | POA: Diagnosis present

## 2018-08-11 DIAGNOSIS — J188 Other pneumonia, unspecified organism: Secondary | ICD-10-CM | POA: Diagnosis not present

## 2018-08-11 DIAGNOSIS — Z885 Allergy status to narcotic agent status: Secondary | ICD-10-CM | POA: Diagnosis not present

## 2018-08-11 DIAGNOSIS — Z79899 Other long term (current) drug therapy: Secondary | ICD-10-CM

## 2018-08-11 DIAGNOSIS — I4891 Unspecified atrial fibrillation: Secondary | ICD-10-CM | POA: Diagnosis not present

## 2018-08-11 DIAGNOSIS — E785 Hyperlipidemia, unspecified: Secondary | ICD-10-CM | POA: Diagnosis present

## 2018-08-11 DIAGNOSIS — G4730 Sleep apnea, unspecified: Secondary | ICD-10-CM | POA: Diagnosis present

## 2018-08-11 DIAGNOSIS — K219 Gastro-esophageal reflux disease without esophagitis: Secondary | ICD-10-CM | POA: Diagnosis present

## 2018-08-11 DIAGNOSIS — D509 Iron deficiency anemia, unspecified: Secondary | ICD-10-CM | POA: Diagnosis present

## 2018-08-11 DIAGNOSIS — Z950 Presence of cardiac pacemaker: Secondary | ICD-10-CM | POA: Diagnosis not present

## 2018-08-11 DIAGNOSIS — J9621 Acute and chronic respiratory failure with hypoxia: Secondary | ICD-10-CM | POA: Diagnosis not present

## 2018-08-11 DIAGNOSIS — A419 Sepsis, unspecified organism: Principal | ICD-10-CM | POA: Diagnosis present

## 2018-08-11 DIAGNOSIS — Z87891 Personal history of nicotine dependence: Secondary | ICD-10-CM | POA: Diagnosis not present

## 2018-08-11 DIAGNOSIS — J441 Chronic obstructive pulmonary disease with (acute) exacerbation: Secondary | ICD-10-CM

## 2018-08-11 DIAGNOSIS — Z8249 Family history of ischemic heart disease and other diseases of the circulatory system: Secondary | ICD-10-CM

## 2018-08-11 DIAGNOSIS — Z87440 Personal history of urinary (tract) infections: Secondary | ICD-10-CM

## 2018-08-11 DIAGNOSIS — I080 Rheumatic disorders of both mitral and aortic valves: Secondary | ICD-10-CM | POA: Diagnosis present

## 2018-08-11 DIAGNOSIS — J189 Pneumonia, unspecified organism: Secondary | ICD-10-CM | POA: Diagnosis present

## 2018-08-11 DIAGNOSIS — D649 Anemia, unspecified: Secondary | ICD-10-CM | POA: Diagnosis not present

## 2018-08-11 DIAGNOSIS — R0902 Hypoxemia: Secondary | ICD-10-CM | POA: Diagnosis not present

## 2018-08-11 DIAGNOSIS — I130 Hypertensive heart and chronic kidney disease with heart failure and stage 1 through stage 4 chronic kidney disease, or unspecified chronic kidney disease: Secondary | ICD-10-CM | POA: Diagnosis present

## 2018-08-11 DIAGNOSIS — Z801 Family history of malignant neoplasm of trachea, bronchus and lung: Secondary | ICD-10-CM

## 2018-08-11 DIAGNOSIS — I428 Other cardiomyopathies: Secondary | ICD-10-CM | POA: Diagnosis not present

## 2018-08-11 DIAGNOSIS — I4821 Permanent atrial fibrillation: Secondary | ICD-10-CM | POA: Diagnosis not present

## 2018-08-11 DIAGNOSIS — Z888 Allergy status to other drugs, medicaments and biological substances status: Secondary | ICD-10-CM

## 2018-08-11 DIAGNOSIS — Z8051 Family history of malignant neoplasm of kidney: Secondary | ICD-10-CM

## 2018-08-11 DIAGNOSIS — I2510 Atherosclerotic heart disease of native coronary artery without angina pectoris: Secondary | ICD-10-CM | POA: Diagnosis present

## 2018-08-11 DIAGNOSIS — E7800 Pure hypercholesterolemia, unspecified: Secondary | ICD-10-CM | POA: Diagnosis present

## 2018-08-11 DIAGNOSIS — J440 Chronic obstructive pulmonary disease with acute lower respiratory infection: Secondary | ICD-10-CM | POA: Diagnosis present

## 2018-08-11 DIAGNOSIS — Z7951 Long term (current) use of inhaled steroids: Secondary | ICD-10-CM

## 2018-08-11 DIAGNOSIS — Z9981 Dependence on supplemental oxygen: Secondary | ICD-10-CM

## 2018-08-11 DIAGNOSIS — M810 Age-related osteoporosis without current pathological fracture: Secondary | ICD-10-CM | POA: Diagnosis present

## 2018-08-11 DIAGNOSIS — I5032 Chronic diastolic (congestive) heart failure: Secondary | ICD-10-CM | POA: Diagnosis not present

## 2018-08-11 DIAGNOSIS — J181 Lobar pneumonia, unspecified organism: Secondary | ICD-10-CM | POA: Diagnosis not present

## 2018-08-11 DIAGNOSIS — I447 Left bundle-branch block, unspecified: Secondary | ICD-10-CM | POA: Diagnosis present

## 2018-08-11 DIAGNOSIS — Z803 Family history of malignant neoplasm of breast: Secondary | ICD-10-CM

## 2018-08-11 DIAGNOSIS — N183 Chronic kidney disease, stage 3 (moderate): Secondary | ICD-10-CM | POA: Diagnosis present

## 2018-08-11 DIAGNOSIS — R918 Other nonspecific abnormal finding of lung field: Secondary | ICD-10-CM | POA: Diagnosis not present

## 2018-08-11 DIAGNOSIS — Z7901 Long term (current) use of anticoagulants: Secondary | ICD-10-CM

## 2018-08-11 DIAGNOSIS — R0602 Shortness of breath: Secondary | ICD-10-CM | POA: Diagnosis not present

## 2018-08-11 DIAGNOSIS — N289 Disorder of kidney and ureter, unspecified: Secondary | ICD-10-CM | POA: Diagnosis not present

## 2018-08-11 DIAGNOSIS — I491 Atrial premature depolarization: Secondary | ICD-10-CM | POA: Diagnosis not present

## 2018-08-11 DIAGNOSIS — D631 Anemia in chronic kidney disease: Secondary | ICD-10-CM | POA: Diagnosis present

## 2018-08-11 LAB — CBC WITH DIFFERENTIAL/PLATELET
Abs Immature Granulocytes: 0.04 10*3/uL (ref 0.00–0.07)
BASOS ABS: 0 10*3/uL (ref 0.0–0.1)
Basophils Relative: 0 %
Eosinophils Absolute: 0 10*3/uL (ref 0.0–0.5)
Eosinophils Relative: 0 %
HCT: 36.2 % (ref 36.0–46.0)
Hemoglobin: 11 g/dL — ABNORMAL LOW (ref 12.0–15.0)
Immature Granulocytes: 0 %
Lymphocytes Relative: 10 %
Lymphs Abs: 1 10*3/uL (ref 0.7–4.0)
MCH: 30.3 pg (ref 26.0–34.0)
MCHC: 30.4 g/dL (ref 30.0–36.0)
MCV: 99.7 fL (ref 80.0–100.0)
Monocytes Absolute: 0.6 10*3/uL (ref 0.1–1.0)
Monocytes Relative: 7 %
Neutro Abs: 7.6 10*3/uL (ref 1.7–7.7)
Neutrophils Relative %: 83 %
Platelets: 225 10*3/uL (ref 150–400)
RBC: 3.63 MIL/uL — ABNORMAL LOW (ref 3.87–5.11)
RDW: 13.9 % (ref 11.5–15.5)
WBC: 9.3 10*3/uL (ref 4.0–10.5)
nRBC: 0 % (ref 0.0–0.2)

## 2018-08-11 LAB — PROTIME-INR
INR: 2.47
Prothrombin Time: 26.4 seconds — ABNORMAL HIGH (ref 11.4–15.2)

## 2018-08-11 LAB — I-STAT CG4 LACTIC ACID, ED
LACTIC ACID, VENOUS: 0.82 mmol/L (ref 0.5–1.9)
Lactic Acid, Venous: 2.02 mmol/L (ref 0.5–1.9)

## 2018-08-11 LAB — COMPREHENSIVE METABOLIC PANEL
ALT: 19 U/L (ref 0–44)
AST: 32 U/L (ref 15–41)
Albumin: 3.8 g/dL (ref 3.5–5.0)
Alkaline Phosphatase: 63 U/L (ref 38–126)
Anion gap: 9 (ref 5–15)
BUN: 18 mg/dL (ref 8–23)
CO2: 26 mmol/L (ref 22–32)
Calcium: 9.1 mg/dL (ref 8.9–10.3)
Chloride: 103 mmol/L (ref 98–111)
Creatinine, Ser: 1.09 mg/dL — ABNORMAL HIGH (ref 0.44–1.00)
GFR calc Af Amer: 55 mL/min — ABNORMAL LOW (ref >60)
GFR calc non Af Amer: 48 mL/min — ABNORMAL LOW (ref >60)
Glucose, Bld: 101 mg/dL — ABNORMAL HIGH (ref 70–99)
Potassium: 3.5 mmol/L (ref 3.5–5.1)
Sodium: 138 mmol/L (ref 135–145)
Total Bilirubin: 0.7 mg/dL (ref 0.3–1.2)
Total Protein: 7.7 g/dL (ref 6.5–8.1)

## 2018-08-11 LAB — GLUCOSE, CAPILLARY
Glucose-Capillary: 123 mg/dL — ABNORMAL HIGH (ref 70–99)
Glucose-Capillary: 165 mg/dL — ABNORMAL HIGH (ref 70–99)

## 2018-08-11 LAB — INFLUENZA PANEL BY PCR (TYPE A & B)
INFLBPCR: NEGATIVE
Influenza A By PCR: NEGATIVE

## 2018-08-11 MED ORDER — FLUTICASONE FUROATE-VILANTEROL 200-25 MCG/INH IN AEPB
1.0000 | INHALATION_SPRAY | Freq: Every day | RESPIRATORY_TRACT | Status: DC
  Administered 2018-08-12 – 2018-08-13 (×2): 1 via RESPIRATORY_TRACT
  Filled 2018-08-11: qty 28

## 2018-08-11 MED ORDER — ONDANSETRON HCL 4 MG/2ML IJ SOLN: 4.0000 mg | Freq: Four times a day (QID) | INTRAMUSCULAR | Status: DC | PRN

## 2018-08-11 MED ORDER — OCUVITE-LUTEIN PO CAPS
1.0000 | ORAL_CAPSULE | Freq: Every day | ORAL | Status: DC
  Administered 2018-08-11 – 2018-08-13 (×3): 1 via ORAL
  Filled 2018-08-11 (×3): qty 1

## 2018-08-11 MED ORDER — PANTOPRAZOLE SODIUM 40 MG PO TBEC
40.0000 mg | DELAYED_RELEASE_TABLET | Freq: Two times a day (BID) | ORAL | Status: DC
  Administered 2018-08-11 – 2018-08-13 (×5): 40 mg via ORAL
  Filled 2018-08-11 (×5): qty 1

## 2018-08-11 MED ORDER — ACETAMINOPHEN 325 MG PO TABS: 650.0000 mg | ORAL_TABLET | Freq: Once | ORAL | Status: DC

## 2018-08-11 MED ORDER — SODIUM CHLORIDE 0.9 % IV SOLN
2.0000 g | INTRAVENOUS | Status: DC
  Administered 2018-08-11 – 2018-08-13 (×3): 2 g via INTRAVENOUS
  Filled 2018-08-11 (×3): qty 20
  Filled 2018-08-11: qty 2
  Filled 2018-08-11: qty 20

## 2018-08-11 MED ORDER — CLONAZEPAM 0.5 MG PO TABS: 0.5000 mg | ORAL_TABLET | Freq: Three times a day (TID) | ORAL | Status: DC | PRN

## 2018-08-11 MED ORDER — LACTATED RINGERS IV SOLN
INTRAVENOUS | Status: AC
  Administered 2018-08-11: 15:00:00 via INTRAVENOUS

## 2018-08-11 MED ORDER — IPRATROPIUM BROMIDE 0.02 % IN SOLN
0.5000 mg | Freq: Four times a day (QID) | RESPIRATORY_TRACT | Status: DC
  Administered 2018-08-11: 0.5 mg via RESPIRATORY_TRACT
  Filled 2018-08-11: qty 2.5

## 2018-08-11 MED ORDER — WARFARIN - PHARMACIST DOSING INPATIENT
Status: DC
  Administered 2018-08-12: 16:00:00

## 2018-08-11 MED ORDER — HYDROCODONE-ACETAMINOPHEN 7.5-325 MG PO TABS
1.0000 | ORAL_TABLET | Freq: Three times a day (TID) | ORAL | Status: DC | PRN
  Administered 2018-08-11 – 2018-08-13 (×6): 1 via ORAL
  Filled 2018-08-11 (×6): qty 1

## 2018-08-11 MED ORDER — SODIUM CHLORIDE 0.9 % IV SOLN
500.0000 mg | INTRAVENOUS | Status: DC
  Administered 2018-08-11 – 2018-08-13 (×3): 500 mg via INTRAVENOUS
  Filled 2018-08-11 (×5): qty 500

## 2018-08-11 MED ORDER — RISAQUAD PO CAPS
ORAL_CAPSULE | Freq: Every day | ORAL | Status: DC
  Administered 2018-08-11 – 2018-08-13 (×3): 1 via ORAL
  Filled 2018-08-11 (×3): qty 1

## 2018-08-11 MED ORDER — LEVALBUTEROL HCL 0.63 MG/3ML IN NEBU
0.6300 mg | INHALATION_SOLUTION | Freq: Four times a day (QID) | RESPIRATORY_TRACT | Status: DC
  Administered 2018-08-11: 0.63 mg via RESPIRATORY_TRACT
  Filled 2018-08-11: qty 3

## 2018-08-11 MED ORDER — ACETAMINOPHEN 325 MG PO TABS
650.0000 mg | ORAL_TABLET | Freq: Four times a day (QID) | ORAL | Status: DC | PRN
  Administered 2018-08-12: 650 mg via ORAL
  Filled 2018-08-11: qty 2

## 2018-08-11 MED ORDER — FERROUS SULFATE 325 (65 FE) MG PO TABS
325.0000 mg | ORAL_TABLET | Freq: Every day | ORAL | Status: DC
  Administered 2018-08-12 – 2018-08-13 (×2): 325 mg via ORAL
  Filled 2018-08-11 (×2): qty 1

## 2018-08-11 MED ORDER — DILTIAZEM HCL ER COATED BEADS 120 MG PO CP24
120.0000 mg | ORAL_CAPSULE | Freq: Every day | ORAL | Status: DC
  Administered 2018-08-11 – 2018-08-13 (×3): 120 mg via ORAL
  Filled 2018-08-11 (×3): qty 1

## 2018-08-11 MED ORDER — INSULIN ASPART 100 UNIT/ML ~~LOC~~ SOLN
0.0000 [IU] | Freq: Three times a day (TID) | SUBCUTANEOUS | Status: DC
  Administered 2018-08-11: 1 [IU] via SUBCUTANEOUS
  Administered 2018-08-12: 3 [IU] via SUBCUTANEOUS
  Administered 2018-08-12 – 2018-08-13 (×3): 2 [IU] via SUBCUTANEOUS

## 2018-08-11 MED ORDER — WARFARIN SODIUM 1 MG PO TABS
1.0000 mg | ORAL_TABLET | Freq: Once | ORAL | Status: AC
  Administered 2018-08-11: 1 mg via ORAL
  Filled 2018-08-11: qty 1

## 2018-08-11 MED ORDER — LINAGLIPTIN 5 MG PO TABS
5.0000 mg | ORAL_TABLET | Freq: Every day | ORAL | Status: DC
  Administered 2018-08-11 – 2018-08-13 (×3): 5 mg via ORAL
  Filled 2018-08-11 (×3): qty 1

## 2018-08-11 MED ORDER — ACETAMINOPHEN 650 MG RE SUPP: 650.0000 mg | Freq: Four times a day (QID) | RECTAL | Status: DC | PRN

## 2018-08-11 MED ORDER — METHYLPREDNISOLONE SODIUM SUCC 40 MG IJ SOLR
40.0000 mg | Freq: Two times a day (BID) | INTRAMUSCULAR | Status: DC
  Administered 2018-08-11 – 2018-08-13 (×4): 40 mg via INTRAVENOUS
  Filled 2018-08-11 (×4): qty 1

## 2018-08-11 MED ORDER — INSULIN ASPART 100 UNIT/ML ~~LOC~~ SOLN: 0.0000 [IU] | Freq: Every day | SUBCUTANEOUS | Status: DC

## 2018-08-11 MED ORDER — ACETAMINOPHEN 500 MG PO TABS
1000.0000 mg | ORAL_TABLET | Freq: Once | ORAL | Status: AC
  Administered 2018-08-11: 1000 mg via ORAL
  Filled 2018-08-11: qty 2

## 2018-08-11 MED ORDER — DM-GUAIFENESIN ER 30-600 MG PO TB12
1.0000 | ORAL_TABLET | Freq: Two times a day (BID) | ORAL | Status: DC
  Administered 2018-08-11 – 2018-08-13 (×5): 1 via ORAL
  Filled 2018-08-11 (×5): qty 1

## 2018-08-11 MED ORDER — PANCRELIPASE (LIP-PROT-AMYL) 36000-114000 UNITS PO CPEP
36000.0000 [IU] | ORAL_CAPSULE | Freq: Three times a day (TID) | ORAL | Status: DC
  Administered 2018-08-11 – 2018-08-13 (×6): 36000 [IU] via ORAL
  Filled 2018-08-11 (×5): qty 1

## 2018-08-11 MED ORDER — NITROGLYCERIN 0.4 MG SL SUBL: 0.4000 mg | SUBLINGUAL_TABLET | SUBLINGUAL | Status: DC | PRN

## 2018-08-11 MED ORDER — SIMVASTATIN 10 MG PO TABS
10.0000 mg | ORAL_TABLET | Freq: Every day | ORAL | Status: DC
  Administered 2018-08-11 – 2018-08-13 (×3): 10 mg via ORAL
  Filled 2018-08-11 (×3): qty 1

## 2018-08-11 MED ORDER — WARFARIN SODIUM 2.5 MG PO TABS: 2.5000 mg | ORAL_TABLET | Freq: Every day | ORAL | Status: DC

## 2018-08-11 MED ORDER — METOPROLOL TARTRATE 5 MG/5ML IV SOLN
5.0000 mg | Freq: Once | INTRAVENOUS | Status: AC
  Administered 2018-08-11: 5 mg via INTRAVENOUS
  Filled 2018-08-11: qty 5

## 2018-08-11 MED ORDER — ONDANSETRON HCL 4 MG PO TABS: 4.0000 mg | ORAL_TABLET | Freq: Four times a day (QID) | ORAL | Status: DC | PRN

## 2018-08-11 NOTE — H&P (Signed)
History and Physical    [name] YJE:[id number] DOB: 25-Apr-1935 DOA: 08/11/2018  PCP: [name], FNP   Patient coming from: Home  Chief Complaint: Dyspnea  HPI: [name] is a 84 y.o. female with medical history significant for COPD on home chronic 2 L nasal cannula at bedtime, CAD permanent atrial fibrillation with [employer] pacemaker on anticoagulation with Coumadin, chronic diastolic heart failure, hypertension, type 2 diabetes, and GERD who presented to the ED with worsening shortness of breath with onset approximately 4 days ago.  She has also had a cough with yellowish sputum as well as a fever at home with temperature 102 Fahrenheit.  She apparently went to [facility] emergency department 3 days ago and received a steroid shot and prednisone taper but never got this medication filled as she claims that her family members were "too busy."  She tried a nebulizer treatment at home with minimal relief of her symptoms and called EMS who brought her to the ED.  She was also noted to have supratherapeutic INR levels at [facility] and was told to stop her Coumadin for a few days.  She denies any chest pain, lower extremity edema, weight gain, nausea, vomiting, or abdominal pain.   ED Course: Vital signs are noted to be stable.  Lactic acid noted to be 2.02 and patient is currently on 3 L nasal cannula.  She appears to be in no acute respiratory distress and has been started on Rocephin and azithromycin after two-view chest x-ray demonstrated left lower lobe pneumonia.  No significant leukocytosis noted on laboratory data.  Hemoglobin level noted to be 11 and stable.  Creatinine of 1.09 consistent with CKD stage III.  EKG with atrial fibrillation and LBBB.  Review of Systems: All others reviewed and otherwise negative.  Past Medical History:  Diagnosis Date  . Abnormal CT of the chest    Mediastinal and hilar adenopathy followed by Dr. Koleen Nimrod in [place]  . Anemia,  unspecified   . Body mass index (BMI) of 20.0-20.9 in adult FEB 2013 147 LBS  . CAD (coronary artery disease) 2011   Catheterization August 2011: mild nonobstructive coronary artery disease complicated by large left rectus sheath hematoma status post evacuation Coumadin restarted without recurrence  . Carotid artery disease ([place])    Doppler, 05/2012, 0-39% bilateral  . Cataract   . Chronic airway obstruction, not elsewhere classified   . Chronic kidney disease, stage II (mild)   . Congestive heart failure, unspecified    EF now 60%, previous nonischemic cardiomyopathy  . Diabetes mellitus   . Dilated bile duct 10/19/2011  . Diverticulitis Dec 2012  . Gastritis    By EGD  . GERD (gastroesophageal reflux disease)   . Hypercholesterolemia   . Hypertension   . Mitral regurgitation 06/27/2013  . Neurogenic sleep apnea    Uses noctural oxygen prescribed by her pulmonologi  . Pacemaker    Single chamber [place]. Jude ACCENT [id number] SN 719 04/11/1959 10/31/2010  . Permanent atrial fibrillation    H/o tachybradycardia syndrome on Coumadin anticoagulation, has pacemaker.  . Recurrent UTI   . Rheumatic heart disease   . Tachycardia-bradycardia [facility])    s/p St. Jude single chamber PPM 10/2010   . Unspecified hypertensive kidney disease with chronic kidney disease stage I through stage IV, or unspecified(403.90)   . Valvular heart disease    a. H/o moderate mitral stenosis by cath 2011 but no mention of this on 10/2011 echo. b. 10/2011 echo: mod MR, mild AS,  mild TR; EF>65%  . Vitamin D deficiency     Past Surgical History:  Procedure Laterality Date  . CHOLECYSTECTOMY     40+ years ago  . COLONOSCOPY  10/2011   Dyann Ruddle sigmoid to descending diverticulosis, internal hemorrhoids  . ESOPHAGOGASTRODUODENOSCOPY  10/2011   Fields-erosive gastritis, biopsy with no H. pylori, hiatal hernia, peptic duodenitis, no celiac disease, duodenal diverticulum, duodenal nodule  . EUS  11/16/11   Mishra-13 MM  CBD, normal pancreatic duct, otherwise normal EUS  . Evacuation of retroperitoneal and left rectus sheath    . HEMATOMA EVACUATION     femoral  . HERNIA REPAIR     X3  . PILONIDAL CYST / SINUS EXCISION    . Single-chamber pacemaker implantation  3/12   by Dr Caryl Comes     reports that she has been smoking cigarettes. She started smoking about 60 years ago. She has a 11.25 pack-year smoking history. She has never used smokeless tobacco. She reports that she does not drink alcohol or use drugs.  Allergies  Allergen Reactions  . Macrobid [Nitrofurantoin Monohyd Macro] Rash  . Torsemide Nausea And Vomiting  . Tramadol Nausea Only    Family History  Problem Relation Age of Onset  . Cancer Mother        cervical  . Stroke Mother   . Cancer Daughter 7       kidney, lung  . Heart disease Father   . Heart attack Father   . Ulcers Father   . Early death Sister 1       died at 13 months old  . Hypertension Brother   . Cancer Paternal Aunt        breast  . Hypertension Son   . Colon cancer Neg Hx     Prior to Admission medications   Medication Sig Start Date End Date Taking? Authorizing Provider  acetaminophen (TYLENOL) 500 MG tablet Take 1,000 mg by mouth every 6 (six) hours as needed for mild pain, moderate pain or fever.    Yes [provider]  clonazePAM (KLONOPIN) 0.5 MG tablet Take 1 tablet (0.5 mg total) by mouth 3 (three) times daily as needed for anxiety. 06/03/18 08/11/18 Yes Martin, Mary-Margaret, FNP  CREON 36000 units CPEP capsule TAKE 3 CAPSULES WITH MEALS  AND TAKE 1 CAPSULE WITH A SNACK AS DIRECTED 12/22/16  Yes Hassell Done, Mary-Margaret, FNP  diltiazem (CARDIZEM) 120 MG tablet Take 1 tablet (120 mg total) by mouth daily. 04/26/18  Yes Hassell Done, Mary-Margaret, FNP  ferrous sulfate 325 (65 FE) MG tablet Take 1 tablet (325 mg total) by mouth daily with breakfast. 10/29/15  Yes Eckard, Tammy, PharmD  HYDROcodone-acetaminophen (NORCO) 7.5-325 MG tablet Take 1 tablet by mouth 3  (three) times daily as needed (pain).  02/17/16  Yes [provider]  ipratropium-albuterol (DUONEB) 0.5-2.5 (3) MG/3ML SOLN INHALE THE CONTENTS OF 1 VIAL VIA NEBULIZER EVERY 4 HOURS AS NEEDED 11/23/17  Yes Hassell Done, Mary-Margaret, FNP  Multiple Vitamins-Minerals (PRESERVISION AREDS 2) CAPS Take 1 capsule by mouth daily.    Yes [provider]  nitroGLYCERIN (NITROSTAT) 0.4 MG SL tablet Place 1 tablet (0.4 mg total) under the tongue every 5 (five) minutes as needed for chest pain. 06/10/18  Yes Allred, Jeneen Rinks, MD  omeprazole (PRILOSEC) 40 MG capsule Take 40 mg by mouth daily.   Yes [provider]  OXYGEN Inhale 2 L into the lungs at bedtime. 2lpm with sleep only    Yes [provider]  polyethylene glycol  powder (GLYCOLAX/MIRALAX) powder Take 17 g by mouth once.   Yes [provider]  potassium chloride SA (K-DUR,KLOR-CON) 20 MEQ tablet TAKE 1 TABLET EVERY MORNING. 04/06/18  Yes Hassell Done, Mary-Margaret, FNP  Probiotic Product ([place]) CAPS Take 1 capsule by mouth daily.   Yes [provider]  simvastatin (ZOCOR) 10 MG tablet TAKE 1 TABLET EVERY DAY 07/12/18  Yes Hassell Done, Mary-Margaret, FNP  sitaGLIPtin (JANUVIA) 50 MG tablet Take 1 tablet (50 mg total) by mouth daily. 04/26/18  Yes Hassell Done, Mary-Margaret, FNP  SYMBICORT 160-4.5 MCG/ACT inhaler INHALE 2 PUFFS TWICE DAILY 08/11/18  Yes Dettinger, Fransisca Kaufmann, MD  VENTOLIN HFA 108 (90 Base) MCG/ACT inhaler USE 2 PUFFS EVERY 6 HOURS AS NEEDED 01/18/17  Yes Hassell Done, Mary-Margaret, FNP  warfarin (COUMADIN) 2 MG tablet TAKE 1/2  TABLET (1 MG TOTAL) BY MOUTH DAILY AT 6 PM. 06/30/18  Yes Hassell Done, Mary-Margaret, FNP  Blood Glucose Calibration (TRUE METRIX LEVEL 1) LOW SOLN Use weekly 10/23/14   Hassell Done, Mary-Margaret, FNP  Blood Glucose Monitoring Suppl (TRUE METRIX AIR GLUCOSE METER) DEVI 1 Device by Does not apply route 2 (two) times daily. Use to Test blood sugar bid. DX E13.10 12/19/14   [name],  FNP  calcium-vitamin D 250-100 MG-UNIT tablet Take 2 tablets by mouth daily. 12/05/15   Cherre Robins, PharmD  pantoprazole (PROTONIX) 40 MG tablet TAKE 1 TABLET TWICE DAILY Patient not taking: Reported on 08/11/2018 08/04/18   [name], FNP  TRUE METRIX BLOOD GLUCOSE TEST test strip TEST TWO TIMES DAILY AS DIRECTED 02/22/18   [name], FNP  TRUEPLUS LANCETS 28G MISC TEST BLOOD SUGAR TWICE DAILY 03/03/17   [name], FNP    Physical Exam: Vitals:   08/11/18 1115 08/11/18 1122 08/11/18 1130 08/11/18 1145  BP:   109/72   Pulse: 94  94 96  Resp: (!) 24  20 19   Temp:  98.5 F (36.9 C)    TempSrc:  Oral    SpO2: 100%  100% 100%  Weight:      Height:        Constitutional: NAD, calm, comfortable, frail-appearing Vitals:   08/11/18 1115 08/11/18 1122 08/11/18 1130 08/11/18 1145  BP:   109/72   Pulse: 94  94 96  Resp: (!) 24  20 19   Temp:  98.5 F (36.9 C)    TempSrc:  Oral    SpO2: 100%  100% 100%  Weight:      Height:       Eyes: lids and conjunctivae normal ENMT: Mucous membranes are moist.  Neck: normal, supple Respiratory: clear to auscultation bilaterally. Normal respiratory effort. No accessory muscle use.  Currently on 3 L nasal cannula. cardiovascular: Regular rate and rhythm, no murmurs. No extremity edema. Abdomen: no tenderness, no distention. Bowel sounds positive.  Musculoskeletal:  No joint deformity upper and lower extremities.   Skin: no rashes, lesions, ulcers.  Psychiatric: Normal judgment and insight. Alert and oriented x 3. Normal mood.   Labs on Admission: I have personally reviewed following labs and imaging studies  CBC: Recent Labs  Lab 08/11/18 1048  WBC 9.3  NEUTROABS 7.6  HGB 11.0*  HCT 36.2  MCV 99.7  PLT 326   Basic Metabolic Panel: Recent Labs  Lab 08/11/18 1048  NA 138  K 3.5  CL 103  CO2 26  GLUCOSE 101*  BUN 18  CREATININE 1.09*  CALCIUM 9.1   GFR: Estimated Creatinine Clearance: 32  mL/min (A) (by C-G formula based on SCr  of 1.09 mg/dL (H)). Liver Function Tests: Recent Labs  Lab 08/11/18 1048  AST 32  ALT 19  ALKPHOS 63  BILITOT 0.7  PROT 7.7  ALBUMIN 3.8   No results for input(s): LIPASE, AMYLASE in the last 168 hours. No results for input(s): AMMONIA in the last 168 hours. Coagulation Profile: Recent Labs  Lab 08/11/18 1048  INR 2.47   Cardiac Enzymes: No results for input(s): CKTOTAL, CKMB, CKMBINDEX, TROPONINI in the last 168 hours. BNP (last 3 results) No results for input(s): PROBNP in the last 8760 hours. HbA1C: No results for input(s): HGBA1C in the last 72 hours. CBG: No results for input(s): GLUCAP in the last 168 hours. Lipid Profile: No results for input(s): CHOL, HDL, LDLCALC, TRIG, CHOLHDL, LDLDIRECT in the last 72 hours. Thyroid Function Tests: No results for input(s): TSH, T4TOTAL, FREET4, T3FREE, THYROIDAB in the last 72 hours. Anemia Panel: No results for input(s): VITAMINB12, FOLATE, FERRITIN, TIBC, IRON, RETICCTPCT in the last 72 hours. Urine analysis:    Component Value Date/Time   COLORURINE YELLOW 11/25/2015 2255   APPEARANCEUR Cloudy (A) 08/25/2017 1732   LABSPEC 1.010 11/25/2015 2255   PHURINE 7.0 11/25/2015 2255   GLUCOSEU Negative 08/25/2017 1732   HGBUR NEGATIVE 11/25/2015 2255   BILIRUBINUR Negative 08/25/2017 1732   KETONESUR NEGATIVE 11/25/2015 2255   PROTEINUR 2+ (A) 08/25/2017 1732   PROTEINUR NEGATIVE 11/25/2015 2255   UROBILINOGEN negative 02/28/2015 1602   UROBILINOGEN 0.2 08/21/2013 1922   NITRITE Negative 08/25/2017 1732   NITRITE NEGATIVE 11/25/2015 2255   LEUKOCYTESUR 1+ (A) 08/25/2017 1732    Radiological Exams on Admission: Dg Chest 2 View  Result Date: 08/11/2018 CLINICAL DATA:  Cough, fever EXAM: CHEST - 2 VIEW COMPARISON:  08/08/2018 FINDINGS: Left single lead pacer remains in place, unchanged. Mild cardiomegaly. Airspace opacity in the left lower lobe concerning for pneumonia. Right lung  clear. Mild hyperinflation. No effusions or acute bony abnormality. IMPRESSION: Left lower lobe airspace opacity concerning for pneumonia. Electronically Signed   By: Rolm Baptise M.D.   On: 08/11/2018 10:57    EKG: Independently reviewed. Afib 112bpm with LBBB.  Assessment/Plan Principal Problem:   COPD with acute exacerbation (HCC) Active Problems:   Permanent atrial fibrillation   COPD GOLD O    Chronic anticoagulation   CAD (coronary artery disease)   Pacemaker-St[employer]Jude   Pneumonia    1. Acute on chronic hypoxemic respiratory failure secondary to AE COPD along with noted left lower lobe pneumonia.  Continue on bronchodilator treatments every 6 hours as well as low-dose IV steroids with methylprednisolone 40 mg IV twice daily as well as Pulmicort.  Will work on pulmonary toilet with flutter valve as well as Mucinex.  Continue on Rocephin and azithromycin in order strep pneumonia as well as Legionella antigen.  Obtain sputum for culture if possible.  Continue to wean oxygen down to baseline 2 L nasal cannula. 2. Permanent atrial fibrillation.  Continue to monitor on telemetry for now and ensure rate control while on home diltiazem.  Continue on anticoagulation with Coumadin with INR to be monitored by pharmacy.  She is currently at therapeutic levels. 3. Type 2 diabetes.  Without any significant hyperglycemia at the moment.  Will order SSI coverage while on steroids.  Monitor closely. 4. CAD.  No chest pain or EKG changes noted.  Will monitor for signs of ACS. 5. GERD.  Maintain on PPI. 6. Anemia.  Currently stable with no overt bleeding.  Appears to be iron deficiency and will  continue supplementation. 7. CKD stage III.  Appears to be at baseline.  Monitor with repeat labs. 8. Chronic diastolic heart failure.  Last echo on 04/2016 with EF 55 to 60%.  No need to repeat at this time.  Continue monitor daily weights.   DVT prophylaxis: Coumadin Code Status: Full Family Communication: None  at bedside Disposition Plan:PNA and AECOPD treatment Consults called:None Admission status: Inpatient, Tele   Kateryna Grantham Darleen Crocker DO [facility] Pager [phone]  If 7PM-7AM, please contact night-coverage [email or web address] Password [facility]  08/11/2018, 12:22 PM

## 2018-08-11 NOTE — ED Notes (Signed)
Patient transported to X-ray 

## 2018-08-11 NOTE — Progress Notes (Signed)
[place] for Warfarin Indication: atrial fibrillation  Allergies  Allergen Reactions  . Macrobid [Nitrofurantoin Monohyd Macro] Rash  . Torsemide Nausea And Vomiting  . Tramadol Nausea Only   Patient Measurements: Height: 5\' 2"  (157.5 cm) Weight: 119 lb (54 kg) IBW/kg (Calculated) : 50.1  Vital Signs: Temp: 98.5 F (36.9 C) (01/02 1122) Temp Source: Oral (01/02 1122) BP: 130/67 (01/02 1330) Pulse Rate: 74 (01/02 1334)  Labs: Recent Labs    08/11/18 1048  HGB 11.0*  HCT 36.2  PLT 225  LABPROT 26.4*  INR 2.47  CREATININE 1.09*   Estimated Creatinine Clearance: 32 mL/min (A) (by C-G formula based on SCr of 1.09 mg/dL (H)).  Medical History: Past Medical History:  Diagnosis Date  . Abnormal CT of the chest    Mediastinal and hilar adenopathy followed by Dr. Koleen Nimrod in [place]  . Anemia, unspecified   . Body mass index (BMI) of 20.0-20.9 in adult FEB 2013 147 LBS  . CAD (coronary artery disease) 2011   Catheterization August 2011: mild nonobstructive coronary artery disease complicated by large left rectus sheath hematoma status post evacuation Coumadin restarted without recurrence  . Carotid artery disease ([place])    Doppler, 05/2012, 0-39% bilateral  . Cataract   . Chronic airway obstruction, not elsewhere classified   . Chronic kidney disease, stage II (mild)   . Congestive heart failure, unspecified    EF now 60%, previous nonischemic cardiomyopathy  . Diabetes mellitus   . Dilated bile duct 10/19/2011  . Diverticulitis Dec 2012  . Gastritis    By EGD  . GERD (gastroesophageal reflux disease)   . Hypercholesterolemia   . Hypertension   . Mitral regurgitation 06/27/2013  . Neurogenic sleep apnea    Uses noctural oxygen prescribed by her pulmonologi  . Pacemaker    Single chamber [place]. Jude ACCENT [phone] SN 719 04/11/1959 10/31/2010  . Permanent atrial fibrillation    H/o tachybradycardia syndrome on Coumadin  anticoagulation, has pacemaker.  . Recurrent UTI   . Rheumatic heart disease   . Tachycardia-bradycardia [facility])    s/p [employer] Jude single chamber PPM 10/2010   . Unspecified hypertensive kidney disease with chronic kidney disease stage I through stage IV, or unspecified(403.90)   . Valvular heart disease    a. H/o moderate mitral stenosis by cath 2011 but no mention of this on 10/2011 echo. b. 10/2011 echo: mod MR, mild AS, mild TR; EF>65%  . Vitamin D deficiency    Medications:  Current Facility-Administered Medications  Medication Dose Route Frequency Provider Last Rate Last Dose  . acetaminophen (TYLENOL) tablet 650 mg  650 mg Oral Q6H PRN Manuella Ghazi, Pratik D, DO       Or  . acetaminophen (TYLENOL) suppository 650 mg  650 mg Rectal Q6H PRN Manuella Ghazi, Pratik D, DO      . acetaminophen (TYLENOL) tablet 650 mg  650 mg Oral Once Manuella Ghazi, Pratik D, DO      . azithromycin (ZITHROMAX) 500 mg in sodium chloride 0.9 % 250 mL IVPB  500 mg Intravenous Q24H Manuella Ghazi, Pratik D, DO   Stopped at 08/11/18 1315  . cefTRIAXone (ROCEPHIN) 2 g in sodium chloride 0.9 % 100 mL IVPB  2 g Intravenous Q24H Manuella Ghazi, Pratik D, DO   Stopped at 08/11/18 1228  . clonazePAM (KLONOPIN) tablet 0.5 mg  0.5 mg Oral TID PRN Manuella Ghazi, Pratik D, DO      . dextromethorphan-guaiFENesin (MUCINEX DM) 30-600 MG per 12 hr tablet 1 tablet  1 tablet Oral BID Manuella Ghazi, Pratik D, DO      . diltiazem (CARDIZEM) tablet 120 mg  120 mg Oral Daily Manuella Ghazi, Pratik D, DO      . [START ON 08/12/2018] ferrous sulfate tablet 325 mg  325 mg Oral Q breakfast Manuella Ghazi, Pratik D, DO      . fluticasone furoate-vilanterol (BREO ELLIPTA) 200-25 MCG/INH 1 puff  1 puff Inhalation Daily Shah, Pratik D, DO      . HYDROcodone-acetaminophen (NORCO) 7.5-325 MG per tablet 1 tablet  1 tablet Oral TID PRN Manuella Ghazi, Pratik D, DO      . insulin aspart (novoLOG) injection 0-5 Units  0-5 Units Subcutaneous QHS Shah, Pratik D, DO      . insulin aspart (novoLOG) injection 0-9 Units  0-9 Units Subcutaneous TID WC  Shah, Pratik D, DO      . ipratropium (ATROVENT) nebulizer solution 0.5 mg  0.5 mg Nebulization Q6H Shah, Pratik D, DO      . lactated ringers infusion   Intravenous Continuous Manuella Ghazi, Pratik D, DO      . levalbuterol Penne Lash) nebulizer solution 0.63 mg  0.63 mg Nebulization Q6H Shah, Pratik D, DO      . linagliptin (TRADJENTA) tablet 5 mg  5 mg Oral Daily Shah, Pratik D, DO      . lipase/protease/amylase (CREON) capsule 36,000 Units  36,000 Units Oral TID WC Shah, Pratik D, DO      . methylPREDNISolone sodium succinate (SOLU-MEDROL) 40 mg/mL injection 40 mg  40 mg Intravenous Q12H Shah, Pratik D, DO      . metoprolol tartrate (LOPRESSOR) injection 5 mg  5 mg Intravenous Once Shah, Pratik D, DO      . nitroGLYCERIN (NITROSTAT) SL tablet 0.4 mg  0.4 mg Sublingual Q5 min PRN Manuella Ghazi, Pratik D, DO      . ondansetron (ZOFRAN) tablet 4 mg  4 mg Oral Q6H PRN Manuella Ghazi, Pratik D, DO       Or  . ondansetron (ZOFRAN) injection 4 mg  4 mg Intravenous Q6H PRN Manuella Ghazi, Pratik D, DO      . pantoprazole (PROTONIX) EC tablet 40 mg  40 mg Oral BID Manuella Ghazi, Pratik D, DO      . PHILLIPS COLON HEALTH CAPS 1 capsule  1 capsule Oral Daily Manuella Ghazi, Pratik D, DO      . PRESERVISION AREDS 2 CAPS 1 capsule  1 capsule Oral Daily Manuella Ghazi, Pratik D, DO      . simvastatin (ZOCOR) tablet 10 mg  10 mg Oral Daily Manuella Ghazi, Pratik D, DO      . warfarin (COUMADIN) tablet 2.5 mg  2.5 mg Oral q1800 Manuella Ghazi, Pratik D, DO       Current Outpatient Medications  Medication Sig Dispense Refill Last Dose  . acetaminophen (TYLENOL) 500 MG tablet Take 1,000 mg by mouth every 6 (six) hours as needed for mild pain, moderate pain or fever.    08/10/2018 at Unknown time  . clonazePAM (KLONOPIN) 0.5 MG tablet Take 1 tablet (0.5 mg total) by mouth 3 (three) times daily as needed for anxiety. 90 tablet 2 08/10/2018 at Unknown time  . CREON 36000 units CPEP capsule TAKE 3 CAPSULES WITH MEALS  AND TAKE 1 CAPSULE WITH A SNACK AS DIRECTED 900 capsule 2 08/10/2018 at Unknown time  .  diltiazem (CARDIZEM) 120 MG tablet Take 1 tablet (120 mg total) by mouth daily. 90 tablet 1 08/10/2018 at Unknown time  . ferrous sulfate 325 (65 FE) MG tablet Take  1 tablet (325 mg total) by mouth daily with breakfast.  3 08/10/2018 at Unknown time  . HYDROcodone-acetaminophen (NORCO) 7.5-325 MG tablet Take 1 tablet by mouth 3 (three) times daily as needed (pain).    08/10/2018 at Unknown time  . ipratropium-albuterol (DUONEB) 0.5-2.5 (3) MG/3ML SOLN INHALE THE CONTENTS OF 1 VIAL VIA NEBULIZER EVERY 4 HOURS AS NEEDED 1080 mL 5 08/11/2018 at Unknown time  . Multiple Vitamins-Minerals (PRESERVISION AREDS 2) CAPS Take 1 capsule by mouth daily.    08/10/2018 at Unknown time  . nitroGLYCERIN (NITROSTAT) 0.4 MG SL tablet Place 1 tablet (0.4 mg total) under the tongue every 5 (five) minutes as needed for chest pain. 25 tablet 3 Taking  . omeprazole (PRILOSEC) 40 MG capsule Take 40 mg by mouth daily.   08/10/2018 at Unknown time  . OXYGEN Inhale 2 L into the lungs at bedtime. 2lpm with sleep only    Taking  . polyethylene glycol powder (GLYCOLAX/MIRALAX) powder Take 17 g by mouth once.   08/10/2018 at Unknown time  . potassium chloride SA (K-DUR,KLOR-CON) 20 MEQ tablet TAKE 1 TABLET EVERY MORNING. 90 tablet 1 08/10/2018 at Unknown time  . Probiotic Product ([place]) CAPS Take 1 capsule by mouth daily.   08/10/2018 at Unknown time  . simvastatin (ZOCOR) 10 MG tablet TAKE 1 TABLET EVERY DAY 90 tablet 0 08/10/2018 at Unknown time  . sitaGLIPtin (JANUVIA) 50 MG tablet Take 1 tablet (50 mg total) by mouth daily. 90 tablet 1 08/10/2018 at Unknown time  . SYMBICORT 160-4.5 MCG/ACT inhaler INHALE 2 PUFFS TWICE DAILY 1 Inhaler 2 08/10/2018 at Unknown time  . VENTOLIN HFA 108 (90 Base) MCG/ACT inhaler USE 2 PUFFS EVERY 6 HOURS AS NEEDED 18 g 4 08/10/2018 at Unknown time  . warfarin (COUMADIN) 2 MG tablet TAKE 1/2  TABLET (1 MG TOTAL) BY MOUTH DAILY AT 6 PM. 90 tablet 1 08/07/2018 at 2100  . Blood Glucose Calibration (TRUE METRIX  LEVEL 1) LOW SOLN Use weekly 3 each 1 Taking  . Blood Glucose Monitoring Suppl (TRUE METRIX AIR GLUCOSE METER) DEVI 1 Device by Does not apply route 2 (two) times daily. Use to Test blood sugar bid. DX E13.10 1 Device 0 Taking  . calcium-vitamin D 250-100 MG-UNIT tablet Take 2 tablets by mouth daily.   Taking  . pantoprazole (PROTONIX) 40 MG tablet TAKE 1 TABLET TWICE DAILY (Patient not taking: Reported on 08/11/2018) 180 tablet 0 Not Taking at Unknown time  . TRUE METRIX BLOOD GLUCOSE TEST test strip TEST TWO TIMES DAILY AS DIRECTED 200 each 11 Taking  . TRUEPLUS LANCETS 28G MISC TEST BLOOD SUGAR TWICE DAILY 200 each 1 Taking   Assessment: Okay for Protocol, INR at goal on admission. Home dose = 1mg  daily.  Goal of Therapy:  INR 2-3   Plan:  Warfarin 1mg  PO x 1 Daily PT/INR Monitor for signs and symptoms of bleeding.   Biagio Quint R 08/11/2018,1:40 PM

## 2018-08-11 NOTE — ED Provider Notes (Addendum)
[name] EMERGENCY DEPARTMENT Provider Note   CSN: [id number] Arrival date & time: 08/11/18  3810     History   Chief Complaint Chief Complaint  Patient presents with  . Shortness of Breath    HPI [name] is a 84 y.o. female.  Complains of cough productive of yellow sputum and shortness of breath onset 4 days ago.  Progressively worsening.  She went to [facility] emergency department 3 days ago received "a steroid shot and prescription for prednisone written, which she has not filled.  She continues to feel worse over the past 4 days.  She denies any vomiting.  She was treated with an nebulized treatment this morning at home, without relief.  EMS increased oxygen to 4 L.  She wears 2 L oxygen nasal cannula normally.  No other associated symptoms.  Nothing makes symptoms better or worse.  She also reports that [facility] told her "my blood was too thin" at her visit 3 days ago.  Her Coumadin was temporarily stopped  HPI  Past Medical History:  Diagnosis Date  . Abnormal CT of the chest    Mediastinal and hilar adenopathy followed by Dr. Koleen Nimrod in [place]  . Anemia, unspecified   . Body mass index (BMI) of 20.0-20.9 in adult FEB 2013 147 LBS  . CAD (coronary artery disease) 2011   Catheterization August 2011: mild nonobstructive coronary artery disease complicated by large left rectus sheath hematoma status post evacuation Coumadin restarted without recurrence  . Carotid artery disease ([place])    Doppler, 05/2012, 0-39% bilateral  . Cataract   . Chronic airway obstruction, not elsewhere classified   . Chronic kidney disease, stage II (mild)   . Congestive heart failure, unspecified    EF now 60%, previous nonischemic cardiomyopathy  . Diabetes mellitus   . Dilated bile duct 10/19/2011  . Diverticulitis Dec 2012  . Gastritis    By EGD  . GERD (gastroesophageal reflux disease)   . Hypercholesterolemia   . Hypertension   . Mitral regurgitation 06/27/2013    . Neurogenic sleep apnea    Uses noctural oxygen prescribed by her pulmonologi  . Pacemaker    Single chamber Renovo. Jude ACCENT [phone] SN 719 04/11/1957 10/31/2010  . Permanent atrial fibrillation    H/o tachybradycardia syndrome on Coumadin anticoagulation, has pacemaker.  . Recurrent UTI   . Rheumatic heart disease   . Tachycardia-bradycardia [facility])    s/p St. Jude single chamber PPM 10/2010   . Unspecified hypertensive kidney disease with chronic kidney disease stage I through stage IV, or unspecified(403.90)   . Valvular heart disease    a. H/o moderate mitral stenosis by cath 2011 but no mention of this on 10/2011 echo. b. 10/2011 echo: mod MR, mild AS, mild TR; EF>65%  . Vitamin D deficiency     Patient Active Problem List   Diagnosis Date Noted  . At high risk for falls 02/13/2016  . Chronic kidney disease (CKD), stage IV (severe) ([place]) 07/02/2015  . Hyperlipidemia with target LDL less than 100 12/31/2014  . Osteoporosis, post-menopausal 11/01/2013  . GERD (gastroesophageal reflux disease) 07/13/2013  . Aortic stenosis 06/27/2013  . Acute on chronic diastolic CHF (congestive heart failure) ([place]) 06/27/2013  . Vitamin D deficiency   . Tobacco abuse 05/12/2012  . Abdominal wall hernia 10/19/2011  . CAD (coronary artery disease)   . Pacemaker-St.Jude   . Chronic anticoagulation 12/03/2010  . Anxiety 12/03/2010  . Mitral stenosis 05/30/2010  . Anemia 05/16/2010  .  Type 2 diabetes mellitus with kidney complication, without long-term current use of insulin ([place]) 01/19/2009  . Essential hypertension 01/19/2009  . Permanent atrial fibrillation 01/19/2009  . COPD GOLD O  01/19/2009  . HEART MURMUR, BENIGN 01/19/2009    Past Surgical History:  Procedure Laterality Date  . CHOLECYSTECTOMY     40+ years ago  . COLONOSCOPY  10/2011   Dyann Ruddle sigmoid to descending diverticulosis, internal hemorrhoids  . ESOPHAGOGASTRODUODENOSCOPY  10/2011   Fields-erosive gastritis, biopsy  with no H. pylori, hiatal hernia, peptic duodenitis, no celiac disease, duodenal diverticulum, duodenal nodule  . EUS  11/16/11   Mishra-13 MM CBD, normal pancreatic duct, otherwise normal EUS  . Evacuation of retroperitoneal and left rectus sheath    . HEMATOMA EVACUATION     femoral  . HERNIA REPAIR     X3  . PILONIDAL CYST / SINUS EXCISION    . Single-chamber pacemaker implantation  3/12   by Dr Caryl Comes     OB History    Gravida  2   Para  2   Term  2   Preterm      AB      Living        SAB      TAB      Ectopic      Multiple      Live Births               Home Medications    Prior to Admission medications   Medication Sig Start Date End Date Taking? Authorizing Provider  acetaminophen (TYLENOL) 500 MG tablet Take 1,000 mg by mouth every 6 (six) hours as needed for mild pain, moderate pain or fever.    Yes [provider]  clonazePAM (KLONOPIN) 0.5 MG tablet Take 1 tablet (0.5 mg total) by mouth 3 (three) times daily as needed for anxiety. 06/03/18 08/11/18 Yes Martin, Mary-Margaret, FNP  CREON 36000 units CPEP capsule TAKE 3 CAPSULES WITH MEALS  AND TAKE 1 CAPSULE WITH A SNACK AS DIRECTED 12/22/16  Yes Hassell Done, Mary-Margaret, FNP  diltiazem (CARDIZEM) 120 MG tablet Take 1 tablet (120 mg total) by mouth daily. 04/26/18  Yes Hassell Done, Mary-Margaret, FNP  ferrous sulfate 325 (65 FE) MG tablet Take 1 tablet (325 mg total) by mouth daily with breakfast. 10/29/15  Yes Eckard, Tammy, PharmD  HYDROcodone-acetaminophen (NORCO) 7.5-325 MG tablet Take 1 tablet by mouth 3 (three) times daily as needed (pain).  02/17/16  Yes [provider]  ipratropium-albuterol (DUONEB) 0.5-2.5 (3) MG/3ML SOLN INHALE THE CONTENTS OF 1 VIAL VIA NEBULIZER EVERY 4 HOURS AS NEEDED 11/23/17  Yes Hassell Done, Mary-Margaret, FNP  Multiple Vitamins-Minerals (PRESERVISION AREDS 2) CAPS Take 1 capsule by mouth daily.    Yes [provider]  nitroGLYCERIN (NITROSTAT) 0.4 MG SL tablet  Place 1 tablet (0.4 mg total) under the tongue every 5 (five) minutes as needed for chest pain. 06/10/18  Yes Allred, Jeneen Rinks, MD  omeprazole (PRILOSEC) 40 MG capsule Take 40 mg by mouth daily.   Yes [provider]  OXYGEN Inhale 2 L into the lungs at bedtime. 2lpm with sleep only    Yes [provider]  polyethylene glycol powder (GLYCOLAX/MIRALAX) powder Take 17 g by mouth once.   Yes [provider]  potassium chloride SA (K-DUR,KLOR-CON) 20 MEQ tablet TAKE 1 TABLET EVERY MORNING. 04/06/18  Yes Hassell Done, Mary-Margaret, FNP  Probiotic Product ([place]) CAPS Take 1 capsule by mouth daily.   Yes [provider]  simvastatin (ZOCOR) 10 MG tablet TAKE 1 TABLET EVERY DAY 07/12/18  Yes Hassell Done, Mary-Margaret, FNP  sitaGLIPtin (JANUVIA) 50 MG tablet Take 1 tablet (50 mg total) by mouth daily. 04/26/18  Yes Hassell Done, Mary-Margaret, FNP  SYMBICORT 160-4.5 MCG/ACT inhaler INHALE 2 PUFFS TWICE DAILY 08/11/18  Yes Dettinger, Fransisca Kaufmann, MD  VENTOLIN HFA 108 (90 Base) MCG/ACT inhaler USE 2 PUFFS EVERY 6 HOURS AS NEEDED 01/18/17  Yes Hassell Done, Mary-Margaret, FNP  warfarin (COUMADIN) 2 MG tablet TAKE 1/2  TABLET (1 MG TOTAL) BY MOUTH DAILY AT 6 PM. 06/30/18  Yes Hassell Done, Mary-Margaret, FNP  Blood Glucose Calibration (TRUE METRIX LEVEL 1) LOW SOLN Use weekly 10/23/14   Hassell Done, Mary-Margaret, FNP  Blood Glucose Monitoring Suppl (TRUE METRIX AIR GLUCOSE METER) DEVI 1 Device by Does not apply route 2 (two) times daily. Use to Test blood sugar bid. DX E13.10 12/19/14   [name], FNP  calcium-vitamin D 250-100 MG-UNIT tablet Take 2 tablets by mouth daily. 12/05/15   Cherre Robins, PharmD  pantoprazole (PROTONIX) 40 MG tablet TAKE 1 TABLET TWICE DAILY Patient not taking: Reported on 08/11/2018 08/04/18   [name], FNP  TRUE METRIX BLOOD GLUCOSE TEST test strip TEST TWO TIMES DAILY AS DIRECTED 02/22/18   [name], FNP  TRUEPLUS LANCETS 28G MISC TEST BLOOD  SUGAR TWICE DAILY 03/03/17   [name], FNP    Family History Family History  Problem Relation Age of Onset  . Cancer Mother        cervical  . Stroke Mother   . Cancer Daughter 1       kidney, lung  . Heart disease Father   . Heart attack Father   . Ulcers Father   . Early death Sister 1       died at 82 months old  . Hypertension Brother   . Cancer Paternal Aunt        breast  . Hypertension Son   . Colon cancer Neg Hx     Social History Social History   Tobacco Use  . Smoking status: Current Every Day Smoker    Packs/day: 0.25    Years: 45.00    Pack years: 11.25    Types: Cigarettes    Start date: 02/05/1958  . Smokeless tobacco: Never Used  . Tobacco comment: smokes 5-6 cigarettes daily  Substance Use Topics  . Alcohol use: No    Alcohol/week: 0.0 standard drinks  . Drug use: No     Allergies   Macrobid [nitrofurantoin monohyd macro]; Torsemide; and Tramadol   Review of Systems Review of Systems  Constitutional: Negative.   HENT: Negative.   Respiratory: Positive for cough and shortness of breath.   Cardiovascular: Negative.   Gastrointestinal: Negative.   Musculoskeletal: Negative.   Skin: Negative.   Neurological: Negative.   Hematological: Bruises/bleeds easily.  Psychiatric/Behavioral: Negative.   All other systems reviewed and are negative.    Physical Exam Updated Vital Signs BP (!) 123/58 (BP Location: Left Arm)   Pulse (!) 113   Temp (!) 102 F (38.9 C) (Rectal)   Resp (!) 29   Ht 5\' 2"  (1.575 m)   Wt 54 kg   SpO2 97%   BMI 21.77 kg/m   Physical Exam Vitals signs and nursing note reviewed.  Constitutional:      Comments: Frail chronically ill-appearing  HENT:     Head: Normocephalic and atraumatic.     Mouth/Throat:     Comments: Edentulous Eyes:     Conjunctiva/sclera:  Conjunctivae normal.     Pupils: Pupils are equal, round, and reactive to light.  Neck:     Musculoskeletal: Neck supple.     Thyroid: No  thyromegaly.     Trachea: No tracheal deviation.  Cardiovascular:     Heart sounds: No murmur.     Comments: Irregularly irregular.  Tachycardic Pulmonary:     Effort: Pulmonary effort is normal.     Comments: Diffuse rhonchi Abdominal:     General: Bowel sounds are normal. There is no distension.     Palpations: Abdomen is soft.     Tenderness: There is no abdominal tenderness.  Musculoskeletal: Normal range of motion.        General: No tenderness.  Skin:    General: Skin is warm and dry.     Findings: No rash.  Neurological:     Coordination: Coordination normal.    Code sepsis called by me, based on sirs criteria fever, tachypnea, tachycardia.  Source of infection felt to be respiratory  ED Treatments / Results  Labs (all labs ordered are listed, but only abnormal results are displayed) Labs Reviewed  CULTURE, BLOOD (ROUTINE X 2)  CULTURE, BLOOD (ROUTINE X 2)  INFLUENZA PANEL BY PCR (TYPE A & B)  COMPREHENSIVE METABOLIC PANEL  CBC WITH DIFFERENTIAL/PLATELET  I-STAT CG4 LACTIC ACID, ED    EKG EKG Interpretation  Date/Time:  Thursday August 11 2018 09:40:57 EST Ventricular Rate:  112 PR Interval:    QRS Duration: 122 QT Interval:  356 QTC Calculation: 486 R Axis:   -77 Text Interpretation:  Atrial fibrillation Left bundle branch block No significant change since last tracing Confirmed by Orlie Dakin [id number]) on 08/11/2018 10:04:12 AM   Radiology No results found.  Procedures Procedures (including critical care time)  Medications Ordered in ED Medications  cefTRIAXone (ROCEPHIN) 2 g in sodium chloride 0.9 % 100 mL IVPB (has no administration in time range)  azithromycin (ZITHROMAX) 500 mg in sodium chloride 0.9 % 250 mL IVPB (has no administration in time range)  acetaminophen (TYLENOL) tablet 1,000 mg (1,000 mg Oral Given 08/11/18 0949)    Chest x-ray viewed by me Results for orders placed or performed during the hospital encounter of 08/11/18    Influenza panel by PCR (type A & B)  Result Value Ref Range   Influenza A By PCR NEGATIVE NEGATIVE   Influenza B By PCR NEGATIVE NEGATIVE  Comprehensive metabolic panel  Result Value Ref Range   Sodium 138 135 - 145 mmol/L   Potassium 3.5 3.5 - 5.1 mmol/L   Chloride 103 98 - 111 mmol/L   CO2 26 22 - 32 mmol/L   Glucose, Bld 101 (H) 70 - 99 mg/dL   BUN 18 8 - 23 mg/dL   Creatinine, Ser 1.09 (H) 0.44 - 1.00 mg/dL   Calcium 9.1 8.9 - 10.3 mg/dL   Total Protein 7.7 6.5 - 8.1 g/dL   Albumin 3.8 3.5 - 5.0 g/dL   AST 32 15 - 41 U/L   ALT 19 0 - 44 U/L   Alkaline Phosphatase 63 38 - 126 U/L   Total Bilirubin 0.7 0.3 - 1.2 mg/dL   GFR calc non Af Amer 48 (L) >60 mL/min   GFR calc Af Amer 55 (L) >60 mL/min   Anion gap 9 5 - 15  CBC WITH DIFFERENTIAL  Result Value Ref Range   WBC 9.3 4.0 - 10.5 K/uL   RBC 3.63 (L) 3.87 - 5.11 MIL/uL   Hemoglobin 11.0 (  L) 12.0 - 15.0 g/dL   HCT 36.2 36.0 - 46.0 %   MCV 99.7 80.0 - 100.0 fL   MCH 30.3 26.0 - 34.0 pg   MCHC 30.4 30.0 - 36.0 g/dL   RDW 13.9 11.5 - 15.5 %   Platelets 225 150 - 400 K/uL   nRBC 0.0 0.0 - 0.2 %   Neutrophils Relative % 83 %   Neutro Abs 7.6 1.7 - 7.7 K/uL   Lymphocytes Relative 10 %   Lymphs Abs 1.0 0.7 - 4.0 K/uL   Monocytes Relative 7 %   Monocytes Absolute 0.6 0.1 - 1.0 K/uL   Eosinophils Relative 0 %   Eosinophils Absolute 0.0 0.0 - 0.5 K/uL   Basophils Relative 0 %   Basophils Absolute 0.0 0.0 - 0.1 K/uL   Immature Granulocytes 0 %   Abs Immature Granulocytes 0.04 0.00 - 0.07 K/uL  Protime-INR  Result Value Ref Range   Prothrombin Time 26.4 (H) 11.4 - 15.2 seconds   INR 2.47   I-Stat CG4 Lactic Acid, ED  Result Value Ref Range   Lactic Acid, Venous 2.02 (HH) 0.5 - 1.9 mmol/L   Dg Chest 2 View  Result Date: 08/11/2018 CLINICAL DATA:  Cough, fever EXAM: CHEST - 2 VIEW COMPARISON:  08/08/2018 FINDINGS: Left single lead pacer remains in place, unchanged. Mild cardiomegaly. Airspace opacity in the left lower  lobe concerning for pneumonia. Right lung clear. Mild hyperinflation. No effusions or acute bony abnormality. IMPRESSION: Left lower lobe airspace opacity concerning for pneumonia. Electronically Signed   By: Rolm Baptise M.D.   On: 08/11/2018 10:57   Initial Impression / Assessment and Plan / ED Course  I have reviewed the triage vital signs and the nursing notes.  Pertinent labs & imaging results that were available during my care of the patient were reviewed by me and considered in my medical decision making (see chart for details).     11:55 AM feels improved after treatment with intravenous antibiotics.  Sepsis - Repeat Assessment  Performed at:    1155am  Vitals     Blood pressure 109/72, pulse 96, temperature 98.5 F (36.9 C), temperature source Oral, resp. rate 19, height 5\' 2"  (1.575 m), weight 54 kg, SpO2 100 %.  Heart:     Irregular rate and rhythm  Lungs:    Rhonchi  Capillary Refill:   <2 sec  Peripheral Pulse:   Radial pulse palpable  Skin:     Normal Color   I consulted Dr.Shah from hospitalist service who will arrange for overnight stay.  Lab work  remarkable for mild anemia and mild renal insufficiency, both chronic  . Final Clinical Impressions(s) / ED Diagnoses  Dx #1 community-acquired pneumonia of left lower lobe  #2 sepsis #3 chronic renal insufficiency #4 anemia Final diagnoses:  None    ED Discharge Orders    None       Orlie Dakin, MD 08/11/18 1202 CRITICAL CARE Performed by: Orlie Dakin Total critical care time: 30 minutes Critical care time was exclusive of separately billable procedures and treating other patients. Critical care was necessary to treat or prevent imminent or life-threatening deterioration. Critical care was time spent personally by me on the following activities: development of treatment plan with patient and/or surrogate as well as nursing, discussions with consultants, evaluation of patient's response to  treatment, examination of patient, obtaining history from patient or surrogate, ordering and performing treatments and interventions, ordering and review of laboratory studies, ordering and review  of radiographic studies, pulse oximetry and re-evaluation of patient's condition.   Orlie Dakin, MD 09/02/18 1421

## 2018-08-11 NOTE — Progress Notes (Signed)
Pt C/O pain and pressure in the lower abdomen, patient has not voided all day. Attending RN'S bladder scanned patient and it showed 542 mL. MD made aware, stated to put in an order for an in and out cath. Pt wanted to try and void on the toilet first before possible catheterization. Pt did void fairly much, but she missed the hat so nursing staff couldn't measure the output. Will let night shift RN know to keep an eye on the patient's urine output, and that the in and out cath order is there if they feel the need to use it.

## 2018-08-11 NOTE — ED Triage Notes (Signed)
Patient brought in by EMS for complaint of shortness of breath x 4 days. States she was seen at [facility] on Monday for COPD exacerbation. Patient complaining of headache at triage. Per EMS, patient O2 91% on 2L, increased O2 to 4L and increased saturation to 98%. Patient states she was given prednisone prescription but has not got it filled.

## 2018-08-12 LAB — BASIC METABOLIC PANEL
Anion gap: 6 (ref 5–15)
BUN: 20 mg/dL (ref 8–23)
CO2: 24 mmol/L (ref 22–32)
Calcium: 8.2 mg/dL — ABNORMAL LOW (ref 8.9–10.3)
Chloride: 107 mmol/L (ref 98–111)
Creatinine, Ser: 0.93 mg/dL (ref 0.44–1.00)
GFR calc Af Amer: >60 mL/min (ref >60)
GFR calc non Af Amer: 58 mL/min — ABNORMAL LOW (ref >60)
GLUCOSE: 147 mg/dL — AB (ref 70–99)
Potassium: 4.1 mmol/L (ref 3.5–5.1)
Sodium: 137 mmol/L (ref 135–145)

## 2018-08-12 LAB — GLUCOSE, CAPILLARY
Glucose-Capillary: 152 mg/dL — ABNORMAL HIGH (ref 70–99)
Glucose-Capillary: 213 mg/dL — ABNORMAL HIGH (ref 70–99)
Glucose-Capillary: 104 mg/dL — ABNORMAL HIGH (ref 70–99)
Glucose-Capillary: 191 mg/dL — ABNORMAL HIGH (ref 70–99)

## 2018-08-12 LAB — LACTIC ACID, PLASMA: Lactic Acid, Venous: 0.8 mmol/L (ref 0.5–1.9)

## 2018-08-12 LAB — CBC
HCT: 30.7 % — ABNORMAL LOW (ref 36.0–46.0)
Hemoglobin: 9.5 g/dL — ABNORMAL LOW (ref 12.0–15.0)
MCH: 30.7 pg (ref 26.0–34.0)
MCHC: 30.9 g/dL (ref 30.0–36.0)
MCV: 99.4 fL (ref 80.0–100.0)
Platelets: 193 10*3/uL (ref 150–400)
RBC: 3.09 MIL/uL — ABNORMAL LOW (ref 3.87–5.11)
RDW: 13.5 % (ref 11.5–15.5)
WBC: 4.8 10*3/uL (ref 4.0–10.5)
nRBC: 0 % (ref 0.0–0.2)

## 2018-08-12 LAB — PROTIME-INR
INR: 2.54
Prothrombin Time: 27 seconds — ABNORMAL HIGH (ref 11.4–15.2)

## 2018-08-12 MED ORDER — LEVALBUTEROL HCL 0.63 MG/3ML IN NEBU
0.6300 mg | INHALATION_SOLUTION | Freq: Three times a day (TID) | RESPIRATORY_TRACT | Status: DC
  Administered 2018-08-12 – 2018-08-13 (×4): 0.63 mg via RESPIRATORY_TRACT
  Filled 2018-08-12 (×5): qty 3

## 2018-08-12 MED ORDER — WARFARIN SODIUM 1 MG PO TABS: 1.0000 mg | ORAL_TABLET | Freq: Once | ORAL | Status: DC

## 2018-08-12 MED ORDER — WARFARIN SODIUM 1 MG PO TABS
1.0000 mg | ORAL_TABLET | Freq: Once | ORAL | Status: AC
  Administered 2018-08-12: 1 mg via ORAL
  Filled 2018-08-12: qty 1

## 2018-08-12 MED ORDER — IPRATROPIUM BROMIDE 0.02 % IN SOLN
0.5000 mg | Freq: Three times a day (TID) | RESPIRATORY_TRACT | Status: DC
  Administered 2018-08-12 – 2018-08-13 (×4): 0.5 mg via RESPIRATORY_TRACT
  Filled 2018-08-12 (×5): qty 2.5

## 2018-08-12 MED ORDER — ALUM & MAG HYDROXIDE-SIMETH 200-200-20 MG/5ML PO SUSP
30.0000 mL | ORAL | Status: DC | PRN
  Administered 2018-08-12 – 2018-08-13 (×2): 30 mL via ORAL
  Filled 2018-08-12 (×2): qty 30

## 2018-08-12 NOTE — Progress Notes (Signed)
PROGRESS NOTE    [name]  PHX:[id number] DOB: 1934-08-22 DOA: 08/11/2018 PCP: [name], FNP   Brief Narrative:  Per HPI:  [name] is a 84 y.o. female with medical history significant for COPD on home chronic 2 L nasal cannula at bedtime, CAD permanent atrial fibrillation with [facility] pacemaker on anticoagulation with Coumadin, chronic diastolic heart failure, hypertension, type 2 diabetes, and GERD who presented to the ED with worsening shortness of breath with onset approximately 4 days ago.  She has also had a cough with yellowish sputum as well as a fever at home with temperature 102 Fahrenheit.  She apparently went to [facility] emergency department 3 days ago and received a steroid shot and prednisone taper but never got this medication filled as she claims that her family members were "too busy."  She tried a nebulizer treatment at home with minimal relief of her symptoms and called EMS who brought her to the ED.  She was also noted to have supratherapeutic INR levels at [facility] and was told to stop her Coumadin for a few days.  She denies any chest pain, lower extremity edema, weight gain, nausea, vomiting, or abdominal pain.  Patient was admitted with acute on chronic hypoxemic respiratory failure secondary to COPD exacerbation with left lower lobe pneumonia.  She was started on Rocephin and azithromycin as well as bronchodilators and IV steroids.  Assessment & Plan:   Principal Problem:   COPD with acute exacerbation ([place]) Active Problems:   Permanent atrial fibrillation   COPD GOLD O    Chronic anticoagulation   CAD (coronary artery disease)   Pacemaker-St.Jude   Pneumonia  1. Acute on chronic hypoxemic respiratory failure secondary to AE COPD along with noted left lower lobe pneumonia.  Continue on bronchodilator treatments every 6 hours as well as low-dose IV steroids with methylprednisolone 40 mg IV twice daily as well as Pulmicort.   Will work on pulmonary toilet with flutter valve as well as Mucinex.  Continue on Rocephin and azithromycin in order strep pneumonia as well as Legionella antigen.  Obtain sputum for culture if possible.  Continue to wean oxygen down to baseline 2 L nasal cannula. 2. Permanent atrial fibrillation.  Continue to monitor on telemetry for now and ensure rate control while on home diltiazem.  Continue on anticoagulation with Coumadin with INR to be monitored by pharmacy.  She is currently at therapeutic levels. 3. Type 2 diabetes.  Without any significant hyperglycemia at the moment.  Will order SSI coverage while on steroids.  Monitor closely. 4. CAD.  No chest pain or EKG changes noted.  Will monitor for signs of ACS. 5. GERD.  Maintain on PPI. 6. Anemia.  Currently stable with no overt bleeding.  Appears to be iron deficiency and will continue supplementation. 7. CKD stage III.  Appears to be at baseline.  Monitor with repeat labs. 8. Chronic diastolic heart failure.  Last echo on 04/2016 with EF 55 to 60%.  No need to repeat at this time.  Continue monitor daily weights.  DVT prophylaxis: Coumadin Code Status: Full Family Communication: None at bedside Disposition Plan: Continue treatment of COPD exacerbation and pneumonia   Consultants:   None  Procedures:   None  Antimicrobials:   Azithromycin and Rocephin 1/2->   Subjective: Patient seen and evaluated today with no new acute complaints or concerns. No acute concerns or events noted overnight.  She appears to be feeling better this morning and has less cough and sputum  production.  She continues to remain on 3 L nasal cannula and wears 2 L at home.  Objective: Vitals:   08/11/18 2035 08/11/18 2207 08/12/18 0500 08/12/18 0836  BP:  (!) 126/56 129/61   Pulse:  62 65   Resp:  16 18   Temp:  98.3 F (36.8 C) 98 F (36.7 C)   TempSrc:  Oral Oral   SpO2: 97% 99% 99% 100%  Weight:      Height:        Intake/Output Summary (Last  24 hours) at 08/12/2018 1206 Last data filed at 08/12/2018 0559 Gross per 24 hour  Intake 1477.7 ml  Output 500 ml  Net 977.7 ml   Filed Weights   08/11/18 0943 08/11/18 1355  Weight: 54 kg 53.3 kg    Examination:  General exam: Appears calm and comfortable  Respiratory system: Clear to auscultation. Respiratory effort normal.  On 3 L nasal cannula. Cardiovascular system: S1 & S2 heard, RRR. No JVD, murmurs, rubs, gallops or clicks. No pedal edema. Gastrointestinal system: Abdomen is nondistended, soft and nontender. No organomegaly or masses felt. Normal bowel sounds heard. Central nervous system: Alert and oriented. No focal neurological deficits. Extremities: Symmetric 5 x 5 power. Skin: No rashes, lesions or ulcers Psychiatry: Judgement and insight appear normal. Mood & affect appropriate.     Data Reviewed: I have personally reviewed following labs and imaging studies  CBC: Recent Labs  Lab 08/11/18 1048 08/12/18 0636  WBC 9.3 4.8  NEUTROABS 7.6  --   HGB 11.0* 9.5*  HCT 36.2 30.7*  MCV 99.7 99.4  PLT 225 409   Basic Metabolic Panel: Recent Labs  Lab 08/11/18 1048 08/12/18 0636  NA 138 137  K 3.5 4.1  CL 103 107  CO2 26 24  GLUCOSE 101* 147*  BUN 18 20  CREATININE 1.09* 0.93  CALCIUM 9.1 8.2*   GFR: Estimated Creatinine Clearance: 37.5 mL/min (by C-G formula based on SCr of 0.93 mg/dL). Liver Function Tests: Recent Labs  Lab 08/11/18 1048  AST 32  ALT 19  ALKPHOS 63  BILITOT 0.7  PROT 7.7  ALBUMIN 3.8   No results for input(s): LIPASE, AMYLASE in the last 168 hours. No results for input(s): AMMONIA in the last 168 hours. Coagulation Profile: Recent Labs  Lab 08/11/18 1048 08/12/18 0636  INR 2.47 2.54   Cardiac Enzymes: No results for input(s): CKTOTAL, CKMB, CKMBINDEX, TROPONINI in the last 168 hours. BNP (last 3 results) No results for input(s): PROBNP in the last 8760 hours. HbA1C: No results for input(s): HGBA1C in the last 72  hours. CBG: Recent Labs  Lab 08/11/18 1658 08/11/18 2208 08/12/18 0718  GLUCAP 123* 165* 152*   Lipid Profile: No results for input(s): CHOL, HDL, LDLCALC, TRIG, CHOLHDL, LDLDIRECT in the last 72 hours. Thyroid Function Tests: No results for input(s): TSH, T4TOTAL, FREET4, T3FREE, THYROIDAB in the last 72 hours. Anemia Panel: No results for input(s): VITAMINB12, FOLATE, FERRITIN, TIBC, IRON, RETICCTPCT in the last 72 hours. Sepsis Labs: Recent Labs  Lab 08/11/18 1050 08/11/18 1211 08/12/18 0636  LATICACIDVEN 2.02* 0.82 0.8    Recent Results (from the past 240 hour(s))  Blood Culture (routine x 2)     Status: None (Preliminary result)   Collection Time: 08/11/18 10:48 AM  Result Value Ref Range Status   Specimen Description BLOOD BLOOD RIGHT FOREARM  Final   Special Requests   Final    BOTTLES DRAWN AEROBIC AND ANAEROBIC Blood Culture results may not  be optimal due to an inadequate volume of blood received in culture bottles   Culture   Final    NO GROWTH < 24 HOURS Performed at [facility], [address], [place], [place] [address]    Report Status PENDING  Incomplete  Blood Culture (routine x 2)     Status: None (Preliminary result)   Collection Time: 08/11/18 10:48 AM  Result Value Ref Range Status   Specimen Description BLOOD BLOOD RIGHT FOREARM  Final   Special Requests   Final    BOTTLES DRAWN AEROBIC AND ANAEROBIC Blood Culture adequate volume   Culture   Final    NO GROWTH < 24 HOURS Performed at [facility], [address] [place], [place] [address]    Report Status PENDING  Incomplete         Radiology Studies: Dg Chest 2 View  Result Date: 08/11/2018 CLINICAL DATA:  Cough, fever EXAM: CHEST - 2 VIEW COMPARISON:  08/08/2018 FINDINGS: Left single lead pacer remains in place, unchanged. Mild cardiomegaly. Airspace opacity in the left lower lobe concerning for pneumonia. Right lung clear. Mild hyperinflation. No effusions or acute bony abnormality.  IMPRESSION: Left lower lobe airspace opacity concerning for pneumonia. Electronically Signed   By: Rolm Baptise M.D.   On: 08/11/2018 10:57        Scheduled Meds: . acetaminophen  650 mg Oral Once  . acidophilus   Oral Daily  . dextromethorphan-guaiFENesin  1 tablet Oral BID  . diltiazem  120 mg Oral Daily  . ferrous sulfate  325 mg Oral Q breakfast  . fluticasone furoate-vilanterol  1 puff Inhalation Daily  . insulin aspart  0-5 Units Subcutaneous QHS  . insulin aspart  0-9 Units Subcutaneous TID WC  . ipratropium  0.5 mg Nebulization TID  . levalbuterol  0.63 mg Nebulization TID  . linagliptin  5 mg Oral Daily  . lipase/protease/amylase  36,000 Units Oral TID WC  . methylPREDNISolone (SOLU-MEDROL) injection  40 mg Intravenous Q12H  . multivitamin-lutein  1 capsule Oral Daily  . pantoprazole  40 mg Oral BID  . simvastatin  10 mg Oral Daily  . Warfarin - Pharmacist Dosing Inpatient   Does not apply Q24H   Continuous Infusions: . azithromycin 500 mg (08/12/18 1154)  . cefTRIAXone (ROCEPHIN)  IV 2 g (08/12/18 1108)     LOS: 1 day    Time spent: 30 minutes    Dock Baccam Darleen Crocker, DO [facility] Pager [phone]  If 7PM-7AM, please contact night-coverage [email or web address] Password [facility] 08/12/2018, 12:06 PM

## 2018-08-12 NOTE — Progress Notes (Signed)
[place] for Warfarin Indication: atrial fibrillation  Allergies  Allergen Reactions  . Macrobid [Nitrofurantoin Monohyd Macro] Rash  . Torsemide Nausea And Vomiting  . Tramadol Nausea Only   Patient Measurements: Height: 5\' 2"  (157.5 cm) Weight: 117 lb 9.6 oz (53.3 kg) IBW/kg (Calculated) : 50.1  Vital Signs: Temp: 98 F (36.7 C) (01/03 0500) Temp Source: Oral (01/03 0500) BP: 129/61 (01/03 0500) Pulse Rate: 65 (01/03 0500)  Labs: Recent Labs    08/11/18 1048 08/12/18 0636  HGB 11.0* 9.5*  HCT 36.2 30.7*  PLT 225 193  LABPROT 26.4* 27.0*  INR 2.47 2.54  CREATININE 1.09* 0.93   Estimated Creatinine Clearance: 37.5 mL/min (by C-G formula based on SCr of 0.93 mg/dL).   Assessment: Pharmacy consulted to manage warfarin for this 72 yof on chronic warfarin therapy for atrial fibrillation. INR was at goal on admission. INR today remains within goal range at 2.54.  Goal of Therapy:  INR 2-3   Plan:  Give warfarin 1mg  PO x 1 dose today for INR Daily PT/INR Monitor for signs and symptoms of bleeding.   Despina Pole 08/12/2018,12:48 PM

## 2018-08-13 LAB — BASIC METABOLIC PANEL
Anion gap: 6 (ref 5–15)
BUN: 25 mg/dL — ABNORMAL HIGH (ref 8–23)
CO2: 24 mmol/L (ref 22–32)
Calcium: 8.3 mg/dL — ABNORMAL LOW (ref 8.9–10.3)
Chloride: 106 mmol/L (ref 98–111)
Creatinine, Ser: 0.94 mg/dL (ref 0.44–1.00)
GFR calc Af Amer: >60 mL/min (ref >60)
GFR calc non Af Amer: 57 mL/min — ABNORMAL LOW (ref >60)
Glucose, Bld: 170 mg/dL — ABNORMAL HIGH (ref 70–99)
Potassium: 4 mmol/L (ref 3.5–5.1)
SODIUM: 136 mmol/L (ref 135–145)

## 2018-08-13 LAB — CBC
HCT: 31.1 % — ABNORMAL LOW (ref 36.0–46.0)
Hemoglobin: 9.5 g/dL — ABNORMAL LOW (ref 12.0–15.0)
MCH: 30.1 pg (ref 26.0–34.0)
MCHC: 30.5 g/dL (ref 30.0–36.0)
MCV: 98.4 fL (ref 80.0–100.0)
PLATELETS: 214 10*3/uL (ref 150–400)
RBC: 3.16 MIL/uL — ABNORMAL LOW (ref 3.87–5.11)
RDW: 13.4 % (ref 11.5–15.5)
WBC: 7.7 10*3/uL (ref 4.0–10.5)
nRBC: 0 % (ref 0.0–0.2)

## 2018-08-13 LAB — GLUCOSE, CAPILLARY
Glucose-Capillary: 162 mg/dL — ABNORMAL HIGH (ref 70–99)
Glucose-Capillary: 155 mg/dL — ABNORMAL HIGH (ref 70–99)

## 2018-08-13 LAB — PROTIME-INR
INR: 2.64
Prothrombin Time: 27.8 seconds — ABNORMAL HIGH (ref 11.4–15.2)

## 2018-08-13 MED ORDER — DM-GUAIFENESIN ER 30-600 MG PO TB12: 1.0000 | ORAL_TABLET | Freq: Two times a day (BID) | ORAL | 0 refills | Status: AC

## 2018-08-13 MED ORDER — DM-GUAIFENESIN ER 30-600 MG PO TB12: 1.0000 | ORAL_TABLET | Freq: Two times a day (BID) | ORAL | 0 refills | Status: DC

## 2018-08-13 MED ORDER — AMOXICILLIN-POT CLAVULANATE 875-125 MG PO TABS: 1.0000 | ORAL_TABLET | Freq: Two times a day (BID) | ORAL | 0 refills | Status: DC

## 2018-08-13 MED ORDER — PREDNISONE 20 MG PO TABS: 40.0000 mg | ORAL_TABLET | Freq: Every day | ORAL | 0 refills | Status: AC

## 2018-08-13 MED ORDER — WARFARIN SODIUM 1 MG PO TABS: 1.0000 mg | ORAL_TABLET | Freq: Once | ORAL | Status: DC

## 2018-08-13 MED ORDER — AMOXICILLIN-POT CLAVULANATE 875-125 MG PO TABS: 1.0000 | ORAL_TABLET | Freq: Two times a day (BID) | ORAL | 0 refills | Status: AC

## 2018-08-13 MED ORDER — ALUM & MAG HYDROXIDE-SIMETH 200-200-20 MG/5ML PO SUSP: 15.0000 mL | Freq: Once | ORAL | Status: DC

## 2018-08-13 NOTE — Progress Notes (Signed)
Removed both IVs-clean, dry, intact. Reviewed d/c paperwork with patient. She requested preprinted prescriptions because her pharmacy is closed after 12 noon on Saturday..Wheeled stable patient and belongings to main entrance where she was picked up by her granddaughter

## 2018-08-13 NOTE — Discharge Summary (Signed)
Physician Discharge Summary  [name] YOV:[id number] DOB: 1934/12/11 DOA: 08/11/2018  PCP: [name], FNP  Admit date: 08/11/2018  Discharge date: 08/13/2018  Admitted From:Home  Disposition:  Home  Recommendations for Outpatient Follow-up:  1. Follow up with PCP in 1-2 weeks 2. Continue on prednisone for 5 more days 3. Continue on Augmentin for 5 more days 4. Continue on home oxygen as prior  Home Health:None  Equipment/Devices:None  Discharge Condition:Stable  CODE STATUS: Full  Diet recommendation: Heart Healthy  Brief/Interim Summary: Per HPI: [name] Robertsonis a 84 y.o.femalewith medical history significant forCOPD on home chronic 2 L nasal cannula at bedtime, CAD permanent atrial fibrillation with [facility] pacemaker on anticoagulation with Coumadin, chronic diastolic heart failure, hypertension, type 2 diabetes, and GERD who presented to the ED with worsening shortness of breath with onset approximately 82 days ago. She has also had a cough with yellowish sputum as well as a fever at home with temperature 102 Fahrenheit. She apparently went to [facility] emergency department 3 days ago and received a steroid shot and prednisone taper but never got this medication filled as she claims that her family members were "too busy." She tried a nebulizer treatment at home with minimal relief of her symptoms and called EMS who brought her to the ED. She was also noted to have supratherapeutic INR levels at [facility] and was told to stop her Coumadin for a few days. She denies any chest pain, lower extremity edema, weight gain, nausea, vomiting, or abdominal pain.  Patient was admitted with acute on chronic hypoxemic respiratory failure secondary to COPD exacerbation with left lower lobe pneumonia.  She was started on Rocephin and azithromycin as well as bronchodilators and IV steroids.  She is doing much better this morning and can be discharged on home  oral Augmentin as well as prednisone.  She will continue her home oxygen via nasal cannula as previously prescribed.  No other acute events noted during this brief course of hospital admission.  Discharge Diagnoses:  Principal Problem:   COPD with acute exacerbation ([place]) Active Problems:   Permanent atrial fibrillation   COPD GOLD O    Chronic anticoagulation   CAD (coronary artery disease)   Pacemaker-[employer]ude   Pneumonia  Principal discharge diagnosis: Acute on chronic hypoxemic respiratory failure secondary to COPD exacerbation.  Discharge Instructions  Discharge Instructions    Diet - low sodium heart healthy   Complete by:  As directed    Increase activity slowly   Complete by:  As directed      Allergies as of 08/13/2018      Reactions   Macrobid [nitrofurantoin Monohyd Macro] Rash   Torsemide Nausea And Vomiting   Tramadol Nausea Only      Medication List    TAKE these medications   acetaminophen 500 MG tablet Commonly known as:  TYLENOL Take 1,000 mg by mouth every 6 (six) hours as needed for mild pain, moderate pain or fever.   amoxicillin-clavulanate 875-125 MG tablet Commonly known as:  AUGMENTIN Take 1 tablet by mouth 2 (two) times daily for 5 days.   calcium-vitamin D 250-100 MG-UNIT tablet Take 2 tablets by mouth daily.   clonazePAM 0.5 MG tablet Commonly known as:  KLONOPIN Take 1 tablet (0.5 mg total) by mouth 3 (three) times daily as needed for anxiety.   CREON 36000 UNITS Cpep capsule Generic drug:  lipase/protease/amylase TAKE 3 CAPSULES WITH MEALS  AND TAKE 1 CAPSULE WITH A SNACK AS DIRECTED   dextromethorphan-guaiFENesin  30-600 MG 12hr tablet Commonly known as:  MUCINEX DM Take 1 tablet by mouth 2 (two) times daily for 10 days.   diltiazem 120 MG tablet Commonly known as:  CARDIZEM Take 1 tablet (120 mg total) by mouth daily.   ferrous sulfate 325 (65 FE) MG tablet Take 1 tablet (325 mg total) by mouth daily with breakfast.    HYDROcodone-acetaminophen 7.5-325 MG tablet Commonly known as:  NORCO Take 1 tablet by mouth 3 (three) times daily as needed (pain).   ipratropium-albuterol 0.5-2.5 (3) MG/3ML Soln Commonly known as:  DUONEB INHALE THE CONTENTS OF 1 VIAL VIA NEBULIZER EVERY 4 HOURS AS NEEDED   nitroGLYCERIN 0.4 MG SL tablet Commonly known as:  NITROSTAT Place 1 tablet (0.4 mg total) under the tongue every 5 (five) minutes as needed for chest pain.   omeprazole 40 MG capsule Commonly known as:  PRILOSEC Take 40 mg by mouth daily.   OXYGEN Inhale 2 L into the lungs at bedtime. 2lpm with sleep only   pantoprazole 40 MG tablet Commonly known as:  PROTONIX TAKE 1 TABLET TWICE DAILY   PHILLIPS COLON HEALTH Caps Take 1 capsule by mouth daily.   polyethylene glycol powder powder Commonly known as:  GLYCOLAX/MIRALAX Take 17 g by mouth once.   potassium chloride SA 20 MEQ tablet Commonly known as:  K-DUR,KLOR-CON TAKE 1 TABLET EVERY MORNING.   predniSONE 20 MG tablet Commonly known as:  DELTASONE Take 2 tablets (40 mg total) by mouth daily for 5 days.   PRESERVISION AREDS 2 Caps Take 1 capsule by mouth daily.   simvastatin 10 MG tablet Commonly known as:  ZOCOR TAKE 1 TABLET EVERY DAY   sitaGLIPtin 50 MG tablet Commonly known as:  JANUVIA Take 1 tablet (50 mg total) by mouth daily.   SYMBICORT 160-4.5 MCG/ACT inhaler Generic drug:  budesonide-formoterol INHALE 2 PUFFS TWICE DAILY   TRUE METRIX AIR GLUCOSE METER Devi 1 Device by Does not apply route 2 (two) times daily. Use to Test blood sugar bid. DX E13.10   TRUE METRIX BLOOD GLUCOSE TEST test strip Generic drug:  glucose blood TEST TWO TIMES DAILY AS DIRECTED   TRUE METRIX LEVEL 1 Low Soln Use weekly   TRUEPLUS LANCETS 28G Misc TEST BLOOD SUGAR TWICE DAILY   VENTOLIN HFA 108 (90 Base) MCG/ACT inhaler Generic drug:  albuterol USE 2 PUFFS EVERY 6 HOURS AS NEEDED   warfarin 2 MG tablet Commonly known as:  COUMADIN Take  as directed. If you are unsure how to take this medication, talk to your nurse or doctor. Original instructions:  TAKE 1/2  TABLET (1 MG TOTAL) BY MOUTH DAILY AT 6 PM.      Follow-up Information    Hassell Done, Mary-Margaret, FNP Follow up in 1 week(s).   Specialty:  Family Medicine Contact information: [place] [address] [phone]        Herminio Commons, MD .   Specialty:  Cardiology Contact information: [place] [address] [phone]        Thompson Grayer, MD .   Specialty:  Cardiology Contact information: [place] [address] [phone]          Allergies  Allergen Reactions  . Macrobid [Nitrofurantoin Monohyd Macro] Rash  . Torsemide Nausea And Vomiting  . Tramadol Nausea Only    Consultations:  None   Procedures/Studies: Dg Chest 2 View  Result Date: 08/11/2018 CLINICAL DATA:  Cough,  fever EXAM: CHEST - 2 VIEW COMPARISON:  08/08/2018 FINDINGS: Left single lead pacer remains in place, unchanged. Mild cardiomegaly. Airspace opacity in the left lower lobe concerning for pneumonia. Right lung clear. Mild hyperinflation. No effusions or acute bony abnormality. IMPRESSION: Left lower lobe airspace opacity concerning for pneumonia. Electronically Signed   By: Rolm Baptise M.D.   On: 08/11/2018 10:57    Discharge Exam: Vitals:   08/13/18 0540 08/13/18 0700  BP: 138/89 133/61  Pulse: 94 70  Resp: 19 17  Temp:  98.4 F (36.9 C)  SpO2: 95% 97%   Vitals:   08/12/18 2037 08/12/18 2104 08/13/18 0540 08/13/18 0700  BP:  119/63 138/89 133/61  Pulse:  70 94 70  Resp:   19 17  Temp:  98.3 F (36.8 C)  98.4 F (36.9 C)  TempSrc:  Oral    SpO2: 95% 97% 95% 97%  Weight:    54.1 kg  Height:        General: Pt is alert, awake, not in acute distress Cardiovascular: RRR, S1/S2 +, no rubs, no gallops Respiratory: CTA bilaterally, no wheezing, no rhonchi, on 2 L nasal cannula Abdominal:  Soft, NT, ND, bowel sounds + Extremities: no edema, no cyanosis    The results of significant diagnostics from this hospitalization (including imaging, microbiology, ancillary and laboratory) are listed below for reference.     Microbiology: Recent Results (from the past 240 hour(s))  Blood Culture (routine x 2)     Status: None (Preliminary result)   Collection Time: 08/11/18 10:48 AM  Result Value Ref Range Status   Specimen Description BLOOD BLOOD RIGHT FOREARM  Final   Special Requests   Final    BOTTLES DRAWN AEROBIC AND ANAEROBIC Blood Culture results may not be optimal due to an inadequate volume of blood received in culture bottles   Culture   Final    NO GROWTH 2 DAYS Performed at [facility], [address], [place], [place] [address]    Report Status PENDING  Incomplete  Blood Culture (routine x 2)     Status: None (Preliminary result)   Collection Time: 08/11/18 10:48 AM  Result Value Ref Range Status   Specimen Description BLOOD BLOOD RIGHT FOREARM  Final   Special Requests   Final    BOTTLES DRAWN AEROBIC AND ANAEROBIC Blood Culture adequate volume   Culture   Final    NO GROWTH 2 DAYS Performed at [facility], [address], [place], [place] [address]    Report Status PENDING  Incomplete     Labs: BNP (last 3 results) No results for input(s): BNP in the last 8760 hours. Basic Metabolic Panel: Recent Labs  Lab 08/11/18 1048 08/12/18 0636 08/13/18 0633  NA 138 137 136  K 3.5 4.1 4.0  CL 103 107 106  CO2 26 24 24   GLUCOSE 101* 147* 170*  BUN 18 20 25*  CREATININE 1.09* 0.93 0.94  CALCIUM 9.1 8.2* 8.3*   Liver Function Tests: Recent Labs  Lab 08/11/18 1048  AST 32  ALT 19  ALKPHOS 63  BILITOT 0.7  PROT 7.7  ALBUMIN 3.8   No results for input(s): LIPASE, AMYLASE in the last 168 hours. No results for input(s): AMMONIA in the last 168 hours. CBC: Recent Labs  Lab 08/11/18 1048 08/12/18 0636 08/13/18 0633  WBC 9.3 4.8 7.7  NEUTROABS 7.6   --   --   HGB 11.0* 9.5* 9.5*  HCT 36.2 30.7* 31.1*  MCV 99.7 99.4  98.4  PLT 225 193 214   Cardiac Enzymes: No results for input(s): CKTOTAL, CKMB, CKMBINDEX, TROPONINI in the last 168 hours. BNP: Invalid input(s): POCBNP CBG: Recent Labs  Lab 08/12/18 0718 08/12/18 1246 08/12/18 1627 08/12/18 2105 08/13/18 0749  GLUCAP 152* 213* 104* 191* 162*   D-Dimer No results for input(s): DDIMER in the last 72 hours. Hgb A1c No results for input(s): HGBA1C in the last 72 hours. Lipid Profile No results for input(s): CHOL, HDL, LDLCALC, TRIG, CHOLHDL, LDLDIRECT in the last 72 hours. Thyroid function studies No results for input(s): TSH, T4TOTAL, T3FREE, THYROIDAB in the last 72 hours.  Invalid input(s): FREET3 Anemia work up No results for input(s): VITAMINB12, FOLATE, FERRITIN, TIBC, IRON, RETICCTPCT in the last 72 hours. Urinalysis    Component Value Date/Time   COLORURINE YELLOW 11/25/2015 2255   APPEARANCEUR Cloudy (A) 08/25/2017 1732   LABSPEC 1.010 11/25/2015 2255   PHURINE 7.0 11/25/2015 2255   GLUCOSEU Negative 08/25/2017 1732   HGBUR NEGATIVE 11/25/2015 2255   BILIRUBINUR Negative 08/25/2017 1732   KETONESUR NEGATIVE 11/25/2015 2255   PROTEINUR 2+ (A) 08/25/2017 1732   PROTEINUR NEGATIVE 11/25/2015 2255   UROBILINOGEN negative 02/28/2015 1602   UROBILINOGEN 0.2 08/21/2013 1922   NITRITE Negative 08/25/2017 1732   NITRITE NEGATIVE 11/25/2015 2255   LEUKOCYTESUR 1+ (A) 08/25/2017 1732   Sepsis Labs Invalid input(s): PROCALCITONIN,  WBC,  LACTICIDVEN Microbiology Recent Results (from the past 240 hour(s))  Blood Culture (routine x 2)     Status: None (Preliminary result)   Collection Time: 08/11/18 10:48 AM  Result Value Ref Range Status   Specimen Description BLOOD BLOOD RIGHT FOREARM  Final   Special Requests   Final    BOTTLES DRAWN AEROBIC AND ANAEROBIC Blood Culture results may not be optimal due to an inadequate volume of blood received in culture bottles    Culture   Final    NO GROWTH 2 DAYS Performed at [facility], [address] [place], [place] [address]    Report Status PENDING  Incomplete  Blood Culture (routine x 2)     Status: None (Preliminary result)   Collection Time: 08/11/18 10:48 AM  Result Value Ref Range Status   Specimen Description BLOOD BLOOD RIGHT FOREARM  Final   Special Requests   Final    BOTTLES DRAWN AEROBIC AND ANAEROBIC Blood Culture adequate volume   Culture   Final    NO GROWTH 2 DAYS Performed at [facility], [address], [place], [place] [address]    Report Status PENDING  Incomplete     Time coordinating discharge: 35 minutes  SIGNED:   Rodena Goldmann, DO [facility] 08/13/2018, 10:21 AM Pager [phone]  If 7PM-7AM, please contact night-coverage [email or web address] Password [email or web address]

## 2018-08-13 NOTE — Progress Notes (Signed)
[place] for Warfarin Indication: atrial fibrillation  Allergies  Allergen Reactions  . Macrobid [Nitrofurantoin Monohyd Macro] Rash  . Torsemide Nausea And Vomiting  . Tramadol Nausea Only   Patient Measurements: Height: 5\' 2"  (157.5 cm) Weight: 119 lb 4.3 oz (54.1 kg) IBW/kg (Calculated) : 50.1  Vital Signs: Temp: 98.4 F (36.9 C) (01/04 0700) BP: 133/61 (01/04 0700) Pulse Rate: 70 (01/04 0700)  Labs: Recent Labs    08/11/18 1048 08/12/18 0636 08/13/18 0633  HGB 11.0* 9.5* 9.5*  HCT 36.2 30.7* 31.1*  PLT 225 193 214  LABPROT 26.4* 27.0* 27.8*  INR 2.47 2.54 2.64  CREATININE 1.09* 0.93 0.94   Estimated Creatinine Clearance: 37.1 mL/min (by C-G formula based on SCr of 0.94 mg/dL).   Assessment: Pharmacy consulted to manage warfarin for this 89 yof on chronic warfarin therapy for atrial fibrillation. INR was at goal on admission. INR today remains within goal range at 2.64.  Goal of Therapy:  INR 2-3   Plan:  Give warfarin 1mg  PO x 1 dose today  Daily PT/INR Monitor for signs and symptoms of bleeding.   Despina Pole 08/13/2018,12:10 PM

## 2018-08-15 ENCOUNTER — Ambulatory Visit: Payer: Medicare HMO | Admitting: Nurse Practitioner

## 2018-08-16 ENCOUNTER — Other Ambulatory Visit: Payer: Self-pay

## 2018-08-16 LAB — CULTURE, BLOOD (ROUTINE X 2)
Culture: NO GROWTH
Culture: NO GROWTH
Special Requests: ADEQUATE

## 2018-08-16 NOTE — Patient Outreach (Signed)
[facility]) Care Management  08/16/2018  Ensley Blas [birth year] [id number]    EMMI-GENERAL DISCHARGE  RED ON EMMI ALERT Day # 1 Date: 08/15/18   1:45 PM  Red Alert Reason: "Questions About Discharge Papers? Yes", "Scheduled follow-up? No", Unfilled prescriptions? Yes"  Outreach attempt # 1 unsuccessful. No answer. RN CM left HIPAA compliant voicemail message along with contact info.  Plan: RN CM will make outreach attempt to patient within 3-4 business days. RN CM will send unsuccessful outreach letter to patient.    Barb Merino, RN,CCM [facility] Care Management Care Management Coordinator Direct Phone: [phone] Toll Free: [phone] Fax: [phone]

## 2018-08-17 ENCOUNTER — Other Ambulatory Visit: Payer: Self-pay

## 2018-08-17 NOTE — Patient Outreach (Signed)
[facility]) Care Management  08/17/2018  [name] 06-30-1935 [id number]    EMMI-GENERAL DISCHARGE #2 ATTEMPT  RED ON EMMI ALERT Day # 1 Date: 08/15/18   1:45 PM  Red Alert Reason: "Questions About Discharge Papers? Yes", "Scheduled follow-up? No", Unfilled prescriptions? Yes"   Outreach attempt # 2, successful.    Spoke with patient, HIPAA verified.   Reviewed and addressed red alerts. Patient states she has filled all of her medications and is taking them as prescribed. She denies questions or financial hardship. She confirms having read over her d/c papers and denies having questions.   Patient lives with her great-granddaughter and has the help of her daughter with transportation. She will f/u with her Pain management doctor tomorrow and plans to schedule a f/u with her PCP for next week. She voiced concern about having elevated blood glucose levels while taking Prednisone. She reports this morning before breakfast her glucose level was elevated to 166. Patient has 3 days of Prednisone remaining. She is currently prescribed to take Januvia 5 mg q am. RN CM instructed patient to continue to monitor her blood glucose levels closely, recommended tid glucose checks, and recording each reading while taking the Prednisone. RN CM instructed patient to contact her PCP with readings that read 250 or greater. Patient verbalizes understanding. She confirms having the 24 hour nurse line phone # for [facility] that she uses for a resource if needed, declines recording [place] line #. She denies having further questions or concerns at this time.   Advised patient she may get one more automated EMMI-GENERAL post discharge call and may receive a call from a nurse if any of her responses are abnormal. Patient voiced understanding and was appreciative of f/u call.    Plan: RN CM will close case due to patient is stable and no CM needs are identified at this time.     Barb Merino, RN,CCM [facility] Care Management Care Management Coordinator Direct Phone: [phone] Toll Free: [phone] Fax: [phone]

## 2018-08-18 ENCOUNTER — Encounter: Payer: Self-pay | Admitting: Cardiology

## 2018-08-18 DIAGNOSIS — G8929 Other chronic pain: Secondary | ICD-10-CM | POA: Diagnosis not present

## 2018-08-18 DIAGNOSIS — M5441 Lumbago with sciatica, right side: Secondary | ICD-10-CM | POA: Diagnosis not present

## 2018-08-18 DIAGNOSIS — Z79899 Other long term (current) drug therapy: Secondary | ICD-10-CM | POA: Diagnosis not present

## 2018-08-20 ENCOUNTER — Other Ambulatory Visit: Payer: Self-pay | Admitting: Nurse Practitioner

## 2018-08-20 DIAGNOSIS — F419 Anxiety disorder, unspecified: Secondary | ICD-10-CM

## 2018-08-25 ENCOUNTER — Other Ambulatory Visit: Payer: Self-pay | Admitting: Nurse Practitioner

## 2018-08-25 DIAGNOSIS — E876 Hypokalemia: Secondary | ICD-10-CM

## 2018-08-26 ENCOUNTER — Ambulatory Visit (INDEPENDENT_AMBULATORY_CARE_PROVIDER_SITE_OTHER): Payer: Medicare HMO

## 2018-08-26 DIAGNOSIS — I4821 Permanent atrial fibrillation: Secondary | ICD-10-CM | POA: Diagnosis not present

## 2018-08-26 DIAGNOSIS — I495 Sick sinus syndrome: Secondary | ICD-10-CM

## 2018-08-27 LAB — CUP PACEART REMOTE DEVICE CHECK
Battery Remaining Percentage: 81 %
Battery Voltage: 2.93 V
Brady Statistic RV Percent Paced: 6 %
Date Time Interrogation Session: 20200116195408
Implantable Lead Implant Date: 20120323
Implantable Lead Location: 753860
Implantable Lead Model: 1948
Implantable Pulse Generator Implant Date: 20120323
Lead Channel Impedance Value: 780 Ohm
Lead Channel Pacing Threshold Amplitude: 0.5 V
Lead Channel Pacing Threshold Pulse Width: 0.5 ms
Lead Channel Sensing Intrinsic Amplitude: 12 mV
Lead Channel Setting Pacing Amplitude: 2.5 V
Lead Channel Setting Pacing Pulse Width: 0.5 ms
MDC IDC MSMT BATTERY REMAINING LONGEVITY: 119 mo
MDC IDC SET LEADCHNL RV SENSING SENSITIVITY: 2 mV
Pulse Gen Model: 1110
Pulse Gen Serial Number: 7199163

## 2018-08-29 NOTE — Progress Notes (Signed)
Remote pacemaker transmission.   

## 2018-08-30 ENCOUNTER — Ambulatory Visit (INDEPENDENT_AMBULATORY_CARE_PROVIDER_SITE_OTHER): Payer: Medicare HMO | Admitting: Cardiovascular Disease

## 2018-08-30 ENCOUNTER — Encounter: Payer: Self-pay | Admitting: Cardiovascular Disease

## 2018-08-30 ENCOUNTER — Encounter

## 2018-08-30 VITALS — BP 136/67 | HR 63 | Ht 62.0 in | Wt 114.0 lb

## 2018-08-30 DIAGNOSIS — I779 Disorder of arteries and arterioles, unspecified: Secondary | ICD-10-CM

## 2018-08-30 DIAGNOSIS — I4821 Permanent atrial fibrillation: Secondary | ICD-10-CM | POA: Diagnosis not present

## 2018-08-30 DIAGNOSIS — I25118 Atherosclerotic heart disease of native coronary artery with other forms of angina pectoris: Secondary | ICD-10-CM | POA: Diagnosis not present

## 2018-08-30 DIAGNOSIS — Z950 Presence of cardiac pacemaker: Secondary | ICD-10-CM

## 2018-08-30 DIAGNOSIS — R001 Bradycardia, unspecified: Secondary | ICD-10-CM | POA: Diagnosis not present

## 2018-08-30 DIAGNOSIS — Z720 Tobacco use: Secondary | ICD-10-CM | POA: Diagnosis not present

## 2018-08-30 DIAGNOSIS — I5032 Chronic diastolic (congestive) heart failure: Secondary | ICD-10-CM

## 2018-08-30 DIAGNOSIS — E785 Hyperlipidemia, unspecified: Secondary | ICD-10-CM

## 2018-08-30 DIAGNOSIS — I739 Peripheral vascular disease, unspecified: Secondary | ICD-10-CM | POA: Diagnosis not present

## 2018-08-30 DIAGNOSIS — I350 Nonrheumatic aortic (valve) stenosis: Secondary | ICD-10-CM

## 2018-08-30 NOTE — Progress Notes (Signed)
SUBJECTIVE: The patient presents for routine follow-up.  She was recently hospitalized for a COPD exacerbation and pneumonia.  Chest x-ray on 08/11/2018 showed a left lower lobe opacity concerning for pneumonia.  She has a history of COPD, chronic diastolic heart failure, permanent atrial fibrillation/flutter, and has a pacemaker.  She also has a h/o mild nonobstructive CAD and a previous tachycardia-induced cardiomyopathy which has since normalized in function.   Coronary angiography on 04/04/2010 demonstrated a 40% proximal LAD lesion, 20% mid left circumflex coronary artery lesion, and the RCA had a 20% proximal, 50-60% mid vessel, and distal 30% stenosis.  Echocardiogram 04/29/16 normal left ventricular systolic function, EF 96-75%, severe left atrial enlargement, mild mitral regurgitation, mild calcific aortic stenosis, mean gradient 11 mmHg, mildly dilated right ventricle with mildly reduced contraction, mild tricuspid regurgitation, PA S/P 42 mmHg, and moderate right atrial enlargement.  Nuclear stress test 03/24/16 showed no perfusion defects consistent with prior infarct or recurrent ischemia.  She denies chest pain.  Her breathing is gradually improving.  She continues to smoke 1/2 pack of cigarettes daily.  She uses 2 L of oxygen at night.   Social history: She has a son who lives in [place] and a daughter who lives in [place] down the road from her.  They are both in their early 13s.  Review of Systems: As per "subjective", otherwise negative.  Allergies  Allergen Reactions  . Macrobid [Nitrofurantoin Monohyd Macro] Rash  . Torsemide Nausea And Vomiting  . Tramadol Nausea Only    Current Outpatient Medications  Medication Sig Dispense Refill  . acetaminophen (TYLENOL) 500 MG tablet Take 1,000 mg by mouth every 6 (six) hours as needed for mild pain, moderate pain or fever.     . Blood Glucose Calibration (TRUE METRIX LEVEL 1) LOW SOLN Use weekly 3 each 1  .  Blood Glucose Monitoring Suppl (TRUE METRIX AIR GLUCOSE METER) DEVI 1 Device by Does not apply route 2 (two) times daily. Use to Test blood sugar bid. DX E13.10 1 Device 0  . calcium-vitamin D 250-100 MG-UNIT tablet Take 2 tablets by mouth daily.    . clonazePAM (KLONOPIN) 0.5 MG tablet TAKE 1 TABLET 3 TIMES DAILY AS NEEDED FOR ANXIETY 90 tablet 0  . CREON 36000 units CPEP capsule TAKE 3 CAPSULES WITH MEALS  AND TAKE 1 CAPSULE WITH A SNACK AS DIRECTED 900 capsule 2  . diltiazem (CARDIZEM) 120 MG tablet Take 1 tablet (120 mg total) by mouth daily. 90 tablet 1  . ferrous sulfate 325 (65 FE) MG tablet Take 1 tablet (325 mg total) by mouth daily with breakfast.  3  . HYDROcodone-acetaminophen (NORCO) 7.5-325 MG tablet Take 1 tablet by mouth 3 (three) times daily as needed (pain).     [name] ipratropium-albuterol (DUONEB) 0.5-2.5 (3) MG/3ML SOLN INHALE THE CONTENTS OF 1 VIAL VIA NEBULIZER EVERY 4 HOURS AS NEEDED 1080 mL 5  . Multiple Vitamins-Minerals (PRESERVISION AREDS 2) CAPS Take 1 capsule by mouth daily.     . nitroGLYCERIN (NITROSTAT) 0.4 MG SL tablet Place 1 tablet (0.4 mg total) under the tongue every 5 (five) minutes as needed for chest pain. 25 tablet 3  . omeprazole (PRILOSEC) 40 MG capsule Take 40 mg by mouth daily.    . OXYGEN Inhale 2 L into the lungs at bedtime. 2lpm with sleep only     . pantoprazole (PROTONIX) 40 MG tablet TAKE 1 TABLET TWICE DAILY 180 tablet 0  . polyethylene glycol powder (GLYCOLAX/MIRALAX) powder  Take 17 g by mouth once.    . potassium chloride SA (K-DUR,KLOR-CON) 20 MEQ tablet TAKE 1 TABLET EVERY MORNING 90 tablet 0  . Probiotic Product (Gibson) CAPS Take 1 capsule by mouth daily.    . simvastatin (ZOCOR) 10 MG tablet TAKE 1 TABLET EVERY DAY 90 tablet 0  . sitaGLIPtin (JANUVIA) 50 MG tablet Take 1 tablet (50 mg total) by mouth daily. 90 tablet 1  . SYMBICORT 160-4.5 MCG/ACT inhaler INHALE 2 PUFFS TWICE DAILY 1 Inhaler 2  . TRUE METRIX BLOOD GLUCOSE TEST  test strip TEST TWO TIMES DAILY AS DIRECTED 200 each 11  . TRUEPLUS LANCETS 28G MISC TEST BLOOD SUGAR TWICE DAILY 200 each 1  . VENTOLIN HFA 108 (90 Base) MCG/ACT inhaler USE 2 PUFFS EVERY 6 HOURS AS NEEDED 18 g 4  . warfarin (COUMADIN) 2 MG tablet TAKE 1/2  TABLET (1 MG TOTAL) BY MOUTH DAILY AT 6 PM. 90 tablet 1   No current facility-administered medications for this visit.     Past Medical History:  Diagnosis Date  . Abnormal CT of the chest    Mediastinal and hilar adenopathy followed by Dr. Koleen Nimrod in [place]  . Anemia, unspecified   . Body mass index (BMI) of 20.0-20.9 in adult FEB 2013 147 LBS  . CAD (coronary artery disease) 2011   Catheterization August 2011: mild nonobstructive coronary artery disease complicated by large left rectus sheath hematoma status post evacuation Coumadin restarted without recurrence  . Carotid artery disease ([place])    Doppler, 05/2012, 0-39% bilateral  . Cataract   . Chronic airway obstruction, not elsewhere classified   . Chronic kidney disease, stage II (mild)   . Congestive heart failure, unspecified    EF now 60%, previous nonischemic cardiomyopathy  . Diabetes mellitus   . Dilated bile duct 10/19/2011  . Diverticulitis Dec 2012  . Gastritis    By EGD  . GERD (gastroesophageal reflux disease)   . Hypercholesterolemia   . Hypertension   . Mitral regurgitation 06/27/2013  . Neurogenic sleep apnea    Uses noctural oxygen prescribed by her pulmonologi  . Pacemaker    Single chamber [place]. Jude ACCENT [phone] SN 719 04/11/1959 10/31/2010  . Permanent atrial fibrillation    H/o tachybradycardia syndrome on Coumadin anticoagulation, has pacemaker.  . Recurrent UTI   . Rheumatic heart disease   . Tachycardia-bradycardia [facility])    s/p St. Jude single chamber PPM 10/2010   . Unspecified hypertensive kidney disease with chronic kidney disease stage I through stage IV, or unspecified(403.90)   . Valvular heart disease    a. H/o moderate mitral stenosis  by cath 2011 but no mention of this on 10/2011 echo. b. 10/2011 echo: mod MR, mild AS, mild TR; EF>65%  . Vitamin D deficiency     Past Surgical History:  Procedure Laterality Date  . CHOLECYSTECTOMY     40+ years ago  . COLONOSCOPY  10/2011   Dyann Ruddle sigmoid to descending diverticulosis, internal hemorrhoids  . ESOPHAGOGASTRODUODENOSCOPY  10/2011   Fields-erosive gastritis, biopsy with no H. pylori, hiatal hernia, peptic duodenitis, no celiac disease, duodenal diverticulum, duodenal nodule  . EUS  11/16/11   Mishra-13 MM CBD, normal pancreatic duct, otherwise normal EUS  . Evacuation of retroperitoneal and left rectus sheath    . HEMATOMA EVACUATION     femoral  . HERNIA REPAIR     X3  . PILONIDAL CYST / SINUS EXCISION    . Single-chamber pacemaker implantation  3/12  by Dr Caryl Comes    Social History   Socioeconomic History  . Marital status: Single    Spouse name: Not on file  . Number of children: 2  . Years of education: 8  . Highest education level: 8th grade  Occupational History  . Occupation: RETIRED    Employer: RETIRED    Comment: [employer]  Social Needs  . Financial resource strain: Not hard at all  . Food insecurity:    Worry: Never true    Inability: Never true  . Transportation needs:    Medical: No    Non-medical: No  Tobacco Use  . Smoking status: Current Every Day Smoker    Packs/day: 0.25    Years: 45.00    Pack years: 11.25    Types: Cigarettes    Start date: 02/05/1958  . Smokeless tobacco: Never Used  . Tobacco comment: smokes 5-6 cigarettes daily  Substance and Sexual Activity  . Alcohol use: No    Alcohol/week: 0.0 standard drinks  . Drug use: No  . Sexual activity: Not Currently  Lifestyle  . Physical activity:    Days per week: 0 days    Minutes per session: 0 min  . Stress: To some extent  Relationships  . Social connections:    Talks on phone: More than three times a week    Gets together: More than three times a week     Attends religious service: Never    Active member of club or organization: No    Attends meetings of clubs or organizations: Never    Relationship status: Never married  . Intimate partner violence:    Fear of current or ex partner: No    Emotionally abused: No    Physically abused: No    Forced sexual activity: No  Other Topics Concern  . Not on file  Social History Narrative  . Not on file     Vitals:   08/30/18 1442  BP: 136/67  Pulse: 63  SpO2: 98%  Weight: 114 lb (51.7 kg)  Height: 5\' 2"  (1.575 m)    Wt Readings from Last 3 Encounters:  08/30/18 114 lb (51.7 kg)  08/13/18 119 lb 4.3 oz (54.1 kg)  06/20/18 123 lb (55.8 kg)     PHYSICAL EXAM General: NAD HEENT: Normal. Neck: No JVD, no thyromegaly. Lungs: Poor air movement, bilateral rhonchi and faint expiratory wheezes, no crackles. CV: Regular rate and irregular rhythm, normal S1/S2, no S3, II/VI ejection systolic murmur heard at RUSB and LUSB.  Trivial pretibial edema. Chronic stasis dermatitis. Abdomen: Soft, nontender, no distention.  Neurologic: Alert and oriented.  Psych: Normal affect. Skin: Normal. Musculoskeletal: No gross deformities.    ECG: Reviewed above under Subjective   Labs: Lab Results  Component Value Date/Time   K 4.0 08/13/2018 06:33 AM   K 4.5 04/02/2012 07:39 AM   BUN 25 (H) 08/13/2018 06:33 AM   BUN 22 04/26/2018 01:01 PM   CREATININE 0.94 08/13/2018 06:33 AM   CREATININE 1.38 (H) 01/18/2013 12:42 PM   ALT 19 08/11/2018 10:48 AM   TSH 3.150 11/01/2015 11:10 AM   HGB 9.5 (L) 08/13/2018 06:33 AM   HGB 10.2 (L) 04/26/2018 01:01 PM     Lipids: Lab Results  Component Value Date/Time   LDLCALC 56 04/26/2018 01:01 PM   LDLCALC 66 05/21/2014 01:21 PM   LDLCALC 78 01/18/2013 12:42 PM   CHOL 109 04/26/2018 01:01 PM   CHOL 125 01/18/2013 12:42 PM   TRIG 110  04/26/2018 01:01 PM   TRIG 102 12/31/2014 02:53 PM   TRIG 68 01/18/2013 12:42 PM   HDL 31 (L) 04/26/2018 01:01 PM    HDL 25 (L) 12/31/2014 02:53 PM   HDL 33 (L) 01/18/2013 12:42 PM       ASSESSMENT AND PLAN:  1. Permanent atrial fibrillationand flutters/p pacemaker: Symptomatically stable.  Continue long-acting diltiazem 120 mg. I am not inclined to reintroduce metoprolol in the future should palpitations recur given her severe COPD. Continue warfarin. No bleeding complications noted.   2. Valvular heart disease: Mild aortic stenosis and mitral and tricuspid regurgitation as noted above.  I will obtain a follow-up echocardiogram.  3. Carotid artery stenosis: Mild bilaterally in October 2013. I will obtain carotid Dopplers.  4. Chronic diastolic heart failure: Euvolemic.Only takes Lasix as needed.  5. Tobacco abuse: Cessation counseling previously provided.  She continues to smoke 1/2 pack of cigarettes daily.  6. Type II diabetes: On Januvia.   7. COPD: Not decompensated at present.  Hospitalized for a recent COPD exacerbation.  8. CAD: Symptomatically stable.  RCA lesion was 50-60% in 2011. Dobutamine Myoview stress test showed no ischemia or scar. Continue SL nitroglycerin to be used prn. Continue statin.  9. Hypercholesterolemia: Lipids reviewed above. Continue simvastatin.    Disposition: Follow up 6 months   Kate Sable, M.D., F.A.C.C.

## 2018-08-30 NOTE — Patient Instructions (Signed)
Medication Instructions:   Your physician recommends that you continue on your current medications as directed. Please refer to the Current Medication list given to you today.  Labwork:  NONE  Testing/Procedures: Your physician has requested that you have an echocardiogram. Echocardiography is a painless test that uses sound waves to create images of your heart. It provides your doctor with information about the size and shape of your heart and how well your heart's chambers and valves are working. This procedure takes approximately one hour. There are no restrictions for this procedure. Your physician has requested that you have a carotid duplex. This test is an ultrasound of the carotid arteries in your neck. It looks at blood flow through these arteries that supply the brain with blood. Allow one hour for this exam. There are no restrictions or special instructions.  Follow-Up:  Your physician recommends that you schedule a follow-up appointment in: 6 months. You will receive a reminder letter in the mail in about 4 months reminding you to call and schedule your appointment. If you don't receive this letter, please contact our office.  Any Other Special Instructions Will Be Listed Below (If Applicable).  If you need a refill on your cardiac medications before your next appointment, please call your pharmacy.

## 2018-09-05 ENCOUNTER — Ambulatory Visit: Payer: Medicare HMO | Admitting: Nurse Practitioner

## 2018-09-07 ENCOUNTER — Ambulatory Visit (INDEPENDENT_AMBULATORY_CARE_PROVIDER_SITE_OTHER): Payer: Medicare HMO | Admitting: Nurse Practitioner

## 2018-09-07 ENCOUNTER — Encounter: Payer: Self-pay | Admitting: Nurse Practitioner

## 2018-09-07 VITALS — BP 110/55 | HR 67 | Temp 97.4°F | Ht 62.0 in | Wt 116.0 lb

## 2018-09-07 DIAGNOSIS — I4821 Permanent atrial fibrillation: Secondary | ICD-10-CM

## 2018-09-07 LAB — COAGUCHEK XS/INR WAIVED
INR: 6 (ref 0.9–1.1)
Prothrombin Time: 72.1 s

## 2018-09-07 NOTE — Progress Notes (Signed)
Subjective:   CHIEF COMPLAINT:  INR recheck  Indication: atrial fibrillation Bleeding signs/symptoms: None Thromboembolic signs/symptoms: None  Missed Coumadin doses: None Medication changes: no Dietary changes: no Bacterial/viral infection: she was diagnosed with pneumonia early in January and ws in hospital. Other concerns: no  The following portions of the patient's history were reviewed and updated as appropriate: allergies, current medications, past family history, past medical history, past social history, past surgical history and problem list.  Review of Systems Pertinent items noted in HPI and remainder of comprehensive ROS otherwise negative.   Objective:    INR Today: 6.0 Current dose: coumadin 1mg  daily    Assessment:    Supratherapeutic INR for goal of 2-3   Plan:    1. New dose: hold today, thurs and fri then 1mg  daily    2. Next INR: Monday Feb 3     [place], [place]

## 2018-09-12 ENCOUNTER — Ambulatory Visit (INDEPENDENT_AMBULATORY_CARE_PROVIDER_SITE_OTHER): Payer: Medicare HMO | Admitting: Nurse Practitioner

## 2018-09-12 ENCOUNTER — Other Ambulatory Visit: Payer: Self-pay | Admitting: Nurse Practitioner

## 2018-09-12 ENCOUNTER — Encounter: Payer: Self-pay | Admitting: Nurse Practitioner

## 2018-09-12 VITALS — BP 108/58 | HR 63 | Temp 96.7°F | Ht 62.0 in | Wt 113.0 lb

## 2018-09-12 DIAGNOSIS — I959 Hypotension, unspecified: Secondary | ICD-10-CM | POA: Diagnosis not present

## 2018-09-12 DIAGNOSIS — E1121 Type 2 diabetes mellitus with diabetic nephropathy: Secondary | ICD-10-CM

## 2018-09-12 DIAGNOSIS — J449 Chronic obstructive pulmonary disease, unspecified: Secondary | ICD-10-CM | POA: Diagnosis not present

## 2018-09-12 DIAGNOSIS — S81811A Laceration without foreign body, right lower leg, initial encounter: Secondary | ICD-10-CM | POA: Diagnosis not present

## 2018-09-12 DIAGNOSIS — W108XXA Fall (on) (from) other stairs and steps, initial encounter: Secondary | ICD-10-CM | POA: Diagnosis not present

## 2018-09-12 DIAGNOSIS — K439 Ventral hernia without obstruction or gangrene: Secondary | ICD-10-CM | POA: Diagnosis not present

## 2018-09-12 DIAGNOSIS — S20212A Contusion of left front wall of thorax, initial encounter: Secondary | ICD-10-CM | POA: Diagnosis not present

## 2018-09-12 DIAGNOSIS — J188 Other pneumonia, unspecified organism: Secondary | ICD-10-CM | POA: Diagnosis not present

## 2018-09-12 DIAGNOSIS — I700 Atherosclerosis of aorta: Secondary | ICD-10-CM | POA: Diagnosis not present

## 2018-09-12 DIAGNOSIS — N2889 Other specified disorders of kidney and ureter: Secondary | ICD-10-CM | POA: Diagnosis not present

## 2018-09-12 DIAGNOSIS — S3991XA Unspecified injury of abdomen, initial encounter: Secondary | ICD-10-CM | POA: Diagnosis not present

## 2018-09-12 DIAGNOSIS — N183 Chronic kidney disease, stage 3 unspecified: Secondary | ICD-10-CM

## 2018-09-12 DIAGNOSIS — S299XXA Unspecified injury of thorax, initial encounter: Secondary | ICD-10-CM | POA: Diagnosis not present

## 2018-09-12 DIAGNOSIS — I4821 Permanent atrial fibrillation: Secondary | ICD-10-CM | POA: Diagnosis not present

## 2018-09-12 DIAGNOSIS — J441 Chronic obstructive pulmonary disease with (acute) exacerbation: Secondary | ICD-10-CM | POA: Diagnosis not present

## 2018-09-12 DIAGNOSIS — D631 Anemia in chronic kidney disease: Secondary | ICD-10-CM

## 2018-09-12 DIAGNOSIS — I130 Hypertensive heart and chronic kidney disease with heart failure and stage 1 through stage 4 chronic kidney disease, or unspecified chronic kidney disease: Secondary | ICD-10-CM | POA: Diagnosis not present

## 2018-09-12 DIAGNOSIS — J439 Emphysema, unspecified: Secondary | ICD-10-CM | POA: Diagnosis not present

## 2018-09-12 DIAGNOSIS — W19XXXA Unspecified fall, initial encounter: Secondary | ICD-10-CM | POA: Diagnosis not present

## 2018-09-12 DIAGNOSIS — I4891 Unspecified atrial fibrillation: Secondary | ICD-10-CM | POA: Diagnosis not present

## 2018-09-12 DIAGNOSIS — R109 Unspecified abdominal pain: Secondary | ICD-10-CM | POA: Diagnosis not present

## 2018-09-12 DIAGNOSIS — S8011XA Contusion of right lower leg, initial encounter: Secondary | ICD-10-CM | POA: Diagnosis not present

## 2018-09-12 DIAGNOSIS — I509 Heart failure, unspecified: Secondary | ICD-10-CM | POA: Diagnosis not present

## 2018-09-12 NOTE — Progress Notes (Signed)
Subjective:   Chief Complaint: INR recheck   Indication: atrial fibrillation Bleeding signs/symptoms: None Thromboembolic signs/symptoms: None  Missed Coumadin doses: None Medication changes: was seen last week with elevated INR- was told to hold meds for 2 days . Dietary changes: no Bacterial/viral infection: no Other concerns: dark colored stools almost look like coffee ground.  The following portions of the patient's history were reviewed and updated as appropriate: allergies, current medications, past family history, past medical history, past social history, past surgical history and problem list.  Review of Systems Pertinent items noted in HPI and remainder of comprehensive ROS otherwise negative.   Objective:    INR Today: 2.3    HGB was 10.5 was 9.5 in early january Current dose: coumadin 1mg  daily    Assessment:    Therapeutic INR for goal of 2-3   Plan:    1. New dose: no change   2. Next INR:   2 weeks  Will watch stools and hgb - dark stool may be coming from iron supplement  [name], FNP

## 2018-09-13 LAB — COAGUCHEK XS/INR WAIVED
INR: 2.3 — ABNORMAL HIGH (ref 0.9–1.1)
Prothrombin Time: 27.9 s

## 2018-09-13 LAB — HEMOGLOBIN, FINGERSTICK: Hemoglobin: 10.5 g/dL — ABNORMAL LOW (ref 11.1–15.9)

## 2018-09-14 ENCOUNTER — Other Ambulatory Visit: Payer: Self-pay | Admitting: Nurse Practitioner

## 2018-09-14 DIAGNOSIS — E785 Hyperlipidemia, unspecified: Secondary | ICD-10-CM

## 2018-09-15 DIAGNOSIS — G8929 Other chronic pain: Secondary | ICD-10-CM | POA: Diagnosis not present

## 2018-09-15 DIAGNOSIS — Z6820 Body mass index (BMI) 20.0-20.9, adult: Secondary | ICD-10-CM | POA: Diagnosis not present

## 2018-09-15 DIAGNOSIS — Z79899 Other long term (current) drug therapy: Secondary | ICD-10-CM | POA: Diagnosis not present

## 2018-09-15 DIAGNOSIS — M5441 Lumbago with sciatica, right side: Secondary | ICD-10-CM | POA: Diagnosis not present

## 2018-09-19 ENCOUNTER — Other Ambulatory Visit: Payer: Self-pay | Admitting: Nurse Practitioner

## 2018-09-19 DIAGNOSIS — F419 Anxiety disorder, unspecified: Secondary | ICD-10-CM

## 2018-09-21 ENCOUNTER — Other Ambulatory Visit: Payer: Medicare HMO

## 2018-09-21 ENCOUNTER — Telehealth: Payer: Self-pay | Admitting: Internal Medicine

## 2018-09-21 NOTE — Telephone Encounter (Signed)
Patient has re-scheduled her echo and dopplers to 10-27-2018.  The Authorization will expire 10/14/2018.

## 2018-09-23 ENCOUNTER — Other Ambulatory Visit: Payer: Self-pay | Admitting: Internal Medicine

## 2018-09-26 ENCOUNTER — Ambulatory Visit: Payer: Medicare HMO | Admitting: Nurse Practitioner

## 2018-09-26 ENCOUNTER — Other Ambulatory Visit: Payer: Self-pay | Admitting: Nurse Practitioner

## 2018-09-26 DIAGNOSIS — I5033 Acute on chronic diastolic (congestive) heart failure: Secondary | ICD-10-CM

## 2018-10-05 ENCOUNTER — Ambulatory Visit: Payer: Medicare HMO | Admitting: Nurse Practitioner

## 2018-10-05 ENCOUNTER — Other Ambulatory Visit: Payer: Self-pay | Admitting: Nurse Practitioner

## 2018-10-07 ENCOUNTER — Ambulatory Visit: Payer: Medicare HMO | Admitting: Family Medicine

## 2018-10-11 ENCOUNTER — Ambulatory Visit: Payer: Medicare HMO | Admitting: Family Medicine

## 2018-10-11 ENCOUNTER — Telehealth: Payer: Self-pay | Admitting: Nurse Practitioner

## 2018-10-11 DIAGNOSIS — J188 Other pneumonia, unspecified organism: Secondary | ICD-10-CM | POA: Diagnosis not present

## 2018-10-11 DIAGNOSIS — J449 Chronic obstructive pulmonary disease, unspecified: Secondary | ICD-10-CM | POA: Diagnosis not present

## 2018-10-11 DIAGNOSIS — J441 Chronic obstructive pulmonary disease with (acute) exacerbation: Secondary | ICD-10-CM | POA: Diagnosis not present

## 2018-10-11 NOTE — Telephone Encounter (Signed)
Appt made

## 2018-10-11 NOTE — Telephone Encounter (Signed)
Make appointment please

## 2018-10-13 DIAGNOSIS — M5441 Lumbago with sciatica, right side: Secondary | ICD-10-CM | POA: Diagnosis not present

## 2018-10-13 DIAGNOSIS — G8929 Other chronic pain: Secondary | ICD-10-CM | POA: Diagnosis not present

## 2018-10-13 DIAGNOSIS — Z6820 Body mass index (BMI) 20.0-20.9, adult: Secondary | ICD-10-CM | POA: Diagnosis not present

## 2018-10-13 DIAGNOSIS — Z79899 Other long term (current) drug therapy: Secondary | ICD-10-CM | POA: Diagnosis not present

## 2018-10-14 ENCOUNTER — Encounter: Payer: Self-pay | Admitting: Nurse Practitioner

## 2018-10-14 ENCOUNTER — Ambulatory Visit (INDEPENDENT_AMBULATORY_CARE_PROVIDER_SITE_OTHER): Payer: Medicare HMO | Admitting: Nurse Practitioner

## 2018-10-14 VITALS — BP 116/60 | HR 60 | Temp 97.2°F | Ht 62.0 in | Wt 112.0 lb

## 2018-10-14 DIAGNOSIS — I4821 Permanent atrial fibrillation: Secondary | ICD-10-CM | POA: Diagnosis not present

## 2018-10-14 LAB — COAGUCHEK XS/INR WAIVED
INR: 4.2 — ABNORMAL HIGH (ref 0.9–1.1)
Prothrombin Time: 50.6 s

## 2018-10-14 NOTE — Progress Notes (Signed)
Subjective:   chief complaint:  INR recheck   Indication: atrial fibrillation Bleeding signs/symptoms: None Thromboembolic signs/symptoms: None  Missed Coumadin doses: None Medication changes: no Dietary changes: no Bacterial/viral infection: no Other concerns: no  The following portions of the patient's history were reviewed and updated as appropriate: allergies, current medications, past family history, past medical history, past social history, past surgical history and problem list.  Review of Systems Pertinent items noted in HPI and remainder of comprehensive ROS otherwise negative.   Objective:    INR Today: 4.2 Current dose: coumadin 1mg  daily    Assessment:    Supratherapeutic INR for goal of 2-3   Plan:    1. New dose: hold coumadin for the next 3 days then start back on 1mg  daily   2. Next INR: 2 weeks    [name], FNP

## 2018-10-19 ENCOUNTER — Other Ambulatory Visit: Payer: Medicare HMO

## 2018-10-20 ENCOUNTER — Other Ambulatory Visit: Payer: Self-pay | Admitting: Family Medicine

## 2018-10-20 DIAGNOSIS — L84 Corns and callosities: Secondary | ICD-10-CM | POA: Diagnosis not present

## 2018-10-20 DIAGNOSIS — B351 Tinea unguium: Secondary | ICD-10-CM | POA: Diagnosis not present

## 2018-10-20 DIAGNOSIS — Z720 Tobacco use: Secondary | ICD-10-CM

## 2018-10-20 DIAGNOSIS — M79676 Pain in unspecified toe(s): Secondary | ICD-10-CM | POA: Diagnosis not present

## 2018-10-20 DIAGNOSIS — I70203 Unspecified atherosclerosis of native arteries of extremities, bilateral legs: Secondary | ICD-10-CM | POA: Diagnosis not present

## 2018-10-27 ENCOUNTER — Other Ambulatory Visit: Payer: Medicare HMO

## 2018-11-02 ENCOUNTER — Other Ambulatory Visit: Payer: Self-pay | Admitting: Nurse Practitioner

## 2018-11-02 ENCOUNTER — Ambulatory Visit (INDEPENDENT_AMBULATORY_CARE_PROVIDER_SITE_OTHER): Payer: Medicare HMO | Admitting: Nurse Practitioner

## 2018-11-02 ENCOUNTER — Other Ambulatory Visit: Payer: Self-pay

## 2018-11-02 ENCOUNTER — Encounter: Payer: Self-pay | Admitting: Nurse Practitioner

## 2018-11-02 VITALS — BP 131/84 | HR 46 | Temp 97.4°F | Ht 62.0 in | Wt 111.0 lb

## 2018-11-02 DIAGNOSIS — I4821 Permanent atrial fibrillation: Secondary | ICD-10-CM

## 2018-11-02 LAB — COAGUCHEK XS/INR WAIVED
INR: 4.5 — ABNORMAL HIGH (ref 0.9–1.1)
Prothrombin Time: 53.8 s

## 2018-11-02 NOTE — Progress Notes (Signed)
Subjective:   Chief Complaint  INR recheck   Indication: atrial fibrillation Bleeding signs/symptoms: None Thromboembolic signs/symptoms: None  Missed Coumadin doses: None Medication changes: no Dietary changes: no Bacterial/viral infection: no Other concerns: no  The following portions of the patient's history were reviewed and updated as appropriate: allergies, current medications, past family history, past medical history, past social history, past surgical history and problem list.  Review of Systems Pertinent items are noted in HPI.   Objective:    INR Today: 4.5 Current dose: coumadin 1mg  daily    Assessment:    Supratherapeutic INR for goal of 2-3   Plan:    1. New dose: hold coumadin for the next 2 days then back on1mg  daily   2. Next INR: 2 weeks    [name], FNP

## 2018-11-03 MED ORDER — PANCRELIPASE (LIP-PROT-AMYL) 36000-114000 UNITS PO CPEP: ORAL_CAPSULE | ORAL | 2 refills | Status: DC

## 2018-11-03 NOTE — Telephone Encounter (Signed)
Refill sent to pharmacy.   

## 2018-11-10 ENCOUNTER — Encounter: Payer: Medicare HMO | Admitting: *Deleted

## 2018-11-10 ENCOUNTER — Telehealth: Payer: Self-pay | Admitting: Internal Medicine

## 2018-11-10 DIAGNOSIS — Z9189 Other specified personal risk factors, not elsewhere classified: Secondary | ICD-10-CM | POA: Diagnosis not present

## 2018-11-10 DIAGNOSIS — R1031 Right lower quadrant pain: Secondary | ICD-10-CM | POA: Diagnosis not present

## 2018-11-10 DIAGNOSIS — M5441 Lumbago with sciatica, right side: Secondary | ICD-10-CM | POA: Diagnosis not present

## 2018-11-10 DIAGNOSIS — G8929 Other chronic pain: Secondary | ICD-10-CM | POA: Diagnosis not present

## 2018-11-10 NOTE — Telephone Encounter (Signed)
Called and left message with patient in regards to re-scheduling her upcoming Echo and Cartoid studies.

## 2018-11-11 ENCOUNTER — Other Ambulatory Visit: Payer: Self-pay | Admitting: Nurse Practitioner

## 2018-11-11 ENCOUNTER — Telehealth: Payer: Self-pay | Admitting: Internal Medicine

## 2018-11-11 DIAGNOSIS — J441 Chronic obstructive pulmonary disease with (acute) exacerbation: Secondary | ICD-10-CM | POA: Diagnosis not present

## 2018-11-11 DIAGNOSIS — J449 Chronic obstructive pulmonary disease, unspecified: Secondary | ICD-10-CM | POA: Diagnosis not present

## 2018-11-11 DIAGNOSIS — F419 Anxiety disorder, unspecified: Secondary | ICD-10-CM

## 2018-11-11 DIAGNOSIS — J188 Other pneumonia, unspecified organism: Secondary | ICD-10-CM | POA: Diagnosis not present

## 2018-11-11 NOTE — Telephone Encounter (Signed)
°  Precert needed for: Echo & Carotid     Location:  [facility]    Date: Dec 29, 2018

## 2018-11-11 NOTE — Telephone Encounter (Signed)
Last seen 11/02/18. Mmm

## 2018-11-16 ENCOUNTER — Other Ambulatory Visit: Payer: Self-pay

## 2018-11-16 ENCOUNTER — Telehealth: Payer: Self-pay | Admitting: Nurse Practitioner

## 2018-11-16 NOTE — Telephone Encounter (Signed)
We only have her scheduled for an INR. Please advise

## 2018-11-17 ENCOUNTER — Ambulatory Visit (INDEPENDENT_AMBULATORY_CARE_PROVIDER_SITE_OTHER): Payer: Medicare HMO | Admitting: Nurse Practitioner

## 2018-11-17 ENCOUNTER — Other Ambulatory Visit: Payer: Medicare HMO

## 2018-11-17 ENCOUNTER — Encounter: Payer: Self-pay | Admitting: Nurse Practitioner

## 2018-11-17 DIAGNOSIS — I4821 Permanent atrial fibrillation: Secondary | ICD-10-CM | POA: Diagnosis not present

## 2018-11-17 DIAGNOSIS — E1121 Type 2 diabetes mellitus with diabetic nephropathy: Secondary | ICD-10-CM

## 2018-11-17 LAB — BAYER DCA HB A1C WAIVED: HB A1C (BAYER DCA - WAIVED): 5.3 % (ref <7.0)

## 2018-11-17 LAB — COAGUCHEK XS/INR WAIVED
INR: 4.6 — ABNORMAL HIGH (ref 0.9–1.1)
Prothrombin Time: 55.4 s

## 2018-11-17 NOTE — Addendum Note (Signed)
Addended by: Rolena Infante on: 11/17/2018 02:27 PM   Modules accepted: Orders

## 2018-11-17 NOTE — Telephone Encounter (Signed)
If she is out of pain meds then she has taken to many- she can use tylenol until she is able to pick up new rx at pharmacy.

## 2018-11-17 NOTE — Telephone Encounter (Signed)
Pt has her meds, they were filled today.

## 2018-11-17 NOTE — Progress Notes (Signed)
Patient ID: [name], female   DOB: March 24, 1935, 84 y.o.   MRN: [id number]    Virtual Visit via telephone Note  I connected with [name] on 11/17/18 at 1:00 PM by telephone and verified that I am speaking with the correct person using two identifiers. [name] is currently located at home and no one is currently with her during visit. The provider, [name], FNP is located in their home office at time of visit.  I discussed the limitations, risks, security and privacy concerns of performing an evaluation and management service by telephone and the availability of in person appointments. I also discussed with the patient that there may be a patient responsible charge related to this service. The patient expressed understanding and agreed to proceed.   History and Present Illness:  Subjective:   Chief COmplaint: INR recheckk   Indication: atrial fibrillation Bleeding signs/symptoms: None Thromboembolic signs/symptoms: None  Missed Coumadin doses: This week - was told to hold for 2 days because INR was 4.5 ;ast visit Medication changes: no Dietary changes: no Bacterial/viral infection: no Other concerns: no  The following portions of the patient's history were reviewed and updated as appropriate: allergies, current medications, past family history, past medical history, past social history, past surgical history and problem list.  Review of Systems Pertinent items are noted in HPI.   Objective:    INR Today: 4.6 Current dose: coumadin 1mg  daily except 2mg  on friday   chart said 1mg  everyday.  Assessment:    Supratherapeutic INR for goal of 2-3   Plan:    1. New dose: hold for  Days then 1mg  daily    2. Next INR: April 20        I discussed the assessment and treatment plan with the patient. The patient was provided an opportunity to ask questions and all were answered. The patient agreed with the plan and demonstrated an understanding  of the instructions.   The patient was advised to call back or seek an in-person evaluation if the symptoms worsen or if the condition fails to improve as anticipated.  The above assessment and management plan was discussed with the patient. The patient verbalized understanding of and has agreed to the management plan. Patient is aware to call the clinic if symptoms persist or worsen. Patient is aware when to return to the clinic for a follow-up visit. Patient educated on when it is appropriate to go to the emergency department.    I provided 7 minutes of non-face-to-face time during this encounter.    [name], FNP

## 2018-11-18 ENCOUNTER — Other Ambulatory Visit: Payer: Self-pay | Admitting: Nurse Practitioner

## 2018-11-18 DIAGNOSIS — E785 Hyperlipidemia, unspecified: Secondary | ICD-10-CM

## 2018-11-18 DIAGNOSIS — Z950 Presence of cardiac pacemaker: Secondary | ICD-10-CM

## 2018-11-21 ENCOUNTER — Telehealth: Payer: Self-pay | Admitting: Nurse Practitioner

## 2018-11-21 NOTE — Telephone Encounter (Signed)
done

## 2018-11-21 NOTE — Telephone Encounter (Signed)
What is the name of the medication? Simvastatin warfarin  Have you contacted your pharmacy to request a refill? yes  Which pharmacy would you like this sent to? [facility]   Patient notified that their request is being sent to the clinical staff for review and that they should receive a call once it is complete. If they do not receive a call within 24 hours they can check with their pharmacy or our office.

## 2018-11-23 ENCOUNTER — Other Ambulatory Visit: Payer: Medicare HMO

## 2018-11-25 ENCOUNTER — Telehealth: Payer: Self-pay

## 2018-11-25 ENCOUNTER — Other Ambulatory Visit: Payer: Self-pay

## 2018-11-25 ENCOUNTER — Other Ambulatory Visit: Payer: Self-pay | Admitting: Nurse Practitioner

## 2018-11-25 ENCOUNTER — Ambulatory Visit (INDEPENDENT_AMBULATORY_CARE_PROVIDER_SITE_OTHER): Payer: Medicare HMO | Admitting: *Deleted

## 2018-11-25 DIAGNOSIS — I5033 Acute on chronic diastolic (congestive) heart failure: Secondary | ICD-10-CM

## 2018-11-25 DIAGNOSIS — I495 Sick sinus syndrome: Secondary | ICD-10-CM

## 2018-11-25 DIAGNOSIS — R001 Bradycardia, unspecified: Secondary | ICD-10-CM | POA: Diagnosis not present

## 2018-11-25 NOTE — Telephone Encounter (Signed)
Spoke with patient to remind of missed remote transmission 

## 2018-11-26 LAB — CUP PACEART REMOTE DEVICE CHECK
Battery Remaining Longevity: 119 mo
Battery Remaining Percentage: 81 %
Battery Voltage: 2.93 V
Brady Statistic RV Percent Paced: 4.7 %
Date Time Interrogation Session: 20200417212500
Implantable Lead Implant Date: 20120323
Implantable Lead Location: 753860
Implantable Lead Model: 1948
Implantable Pulse Generator Implant Date: 20120323
Lead Channel Impedance Value: 700 Ohm
Lead Channel Pacing Threshold Amplitude: 0.5 V
Lead Channel Pacing Threshold Pulse Width: 0.5 ms
Lead Channel Sensing Intrinsic Amplitude: 10.5 mV
Lead Channel Setting Pacing Amplitude: 2.5 V
Lead Channel Setting Pacing Pulse Width: 0.5 ms
Lead Channel Setting Sensing Sensitivity: 2 mV
Pulse Gen Model: 1110
Pulse Gen Serial Number: 7199163

## 2018-11-28 ENCOUNTER — Other Ambulatory Visit: Payer: Self-pay

## 2018-11-28 ENCOUNTER — Other Ambulatory Visit: Payer: Medicare HMO

## 2018-11-28 ENCOUNTER — Ambulatory Visit (INDEPENDENT_AMBULATORY_CARE_PROVIDER_SITE_OTHER): Payer: Medicare HMO | Admitting: Family Medicine

## 2018-11-28 VITALS — BP 138/68 | HR 70 | Temp 97.8°F | Ht 62.0 in | Wt 108.0 lb

## 2018-11-28 DIAGNOSIS — Z7901 Long term (current) use of anticoagulants: Secondary | ICD-10-CM

## 2018-11-28 DIAGNOSIS — I4821 Permanent atrial fibrillation: Secondary | ICD-10-CM | POA: Diagnosis not present

## 2018-11-28 NOTE — Progress Notes (Signed)
Subjective: CC: Atrial fibrillation/chronic anticoagulation PCP: [name], FNP DZH:[id number] Westrich is a 84 y.o. female presenting to clinic today for:  1. atrial fibrillation/chronic anticoagulation Patient here for INR check.  She has a history of atrial fibrillation and is on chronic anticoagulation with warfarin.  At her last visit, her INR was noted to be supratherapeutic at 4.6.  She reports that her Coumadin was held and then decrease to 1 mg daily.  Denies any recent antibiotic use or recent consumption of greens or any vitamin K rich foods.  She reports compliance with her medications.  No heart palpitations.  She does report a bruise on the left hip that has been present for a couple of weeks now.  No other abnormal bleeding including vaginal bleeding, hematuria or melena.  ROS: Per HPI  Allergies  Allergen Reactions  . Macrobid [Nitrofurantoin Monohyd Macro] Rash  . Torsemide Nausea And Vomiting  . Tramadol Nausea Only   Past Medical History:  Diagnosis Date  . Abnormal CT of the chest    Mediastinal and hilar adenopathy followed by Dr. Koleen Nimrod in [place]  . Anemia, unspecified   . Body mass index (BMI) of 20.0-20.9 in adult FEB 2013 147 LBS  . CAD (coronary artery disease) 2011   Catheterization August 2011: mild nonobstructive coronary artery disease complicated by large left rectus sheath hematoma status post evacuation Coumadin restarted without recurrence  . Carotid artery disease ([place])    Doppler, 05/2012, 0-39% bilateral  . Cataract   . Chronic airway obstruction, not elsewhere classified   . Chronic kidney disease, stage II (mild)   . Congestive heart failure, unspecified    EF now 60%, previous nonischemic cardiomyopathy  . Diabetes mellitus   . Dilated bile duct 10/19/2011  . Diverticulitis Dec 2012  . Gastritis    By EGD  . GERD (gastroesophageal reflux disease)   . Hypercholesterolemia   . Hypertension   . Mitral regurgitation 06/27/2013   . Neurogenic sleep apnea    Uses noctural oxygen prescribed by her pulmonologi  . Pacemaker    Single chamber [employer]. Jude ACCENT [phone] SN 719 04/11/1957 10/31/2010  . Permanent atrial fibrillation    H/o tachybradycardia syndrome on Coumadin anticoagulation, has pacemaker.  . Recurrent UTI   . Rheumatic heart disease   . Tachycardia-bradycardia [facility])    s/p St. Jude single chamber PPM 10/2010   . Unspecified hypertensive kidney disease with chronic kidney disease stage I through stage IV, or unspecified(403.90)   . Valvular heart disease    a. H/o moderate mitral stenosis by cath 2011 but no mention of this on 10/2011 echo. b. 10/2011 echo: mod MR, mild AS, mild TR; EF>65%  . Vitamin D deficiency     Current Outpatient Medications:  .  acetaminophen (TYLENOL) 500 MG tablet, Take 1,000 mg by mouth every 6 (six) hours as needed for mild pain, moderate pain or fever. , Disp: , Rfl:  .  Blood Glucose Calibration (TRUE METRIX LEVEL 1) LOW SOLN, Use weekly, Disp: 3 each, Rfl: 1 .  Blood Glucose Monitoring Suppl (TRUE METRIX AIR GLUCOSE METER) DEVI, 1 Device by Does not apply route 2 (two) times daily. Use to Test blood sugar bid. DX E13.10, Disp: 1 Device, Rfl: 0 .  budesonide-formoterol (SYMBICORT) 160-4.5 MCG/ACT inhaler, INHALE 2 PUFFS TWICE DAILY, Disp: 3 Inhaler, Rfl: 2 .  calcium-vitamin D 250-100 MG-UNIT tablet, Take 2 tablets by mouth daily., Disp: , Rfl:  .  clonazePAM (KLONOPIN) 0.5 MG tablet, TAKE 1  TABLET 3 TIMES DAILY AS NEEDED FOR ANXIETY, Disp: 90 tablet, Rfl: 1 .  diltiazem (CARDIZEM) 120 MG tablet, TAKE 1 TABLET EVERY DAY, Disp: 90 tablet, Rfl: 0 .  ferrous sulfate 325 (65 FE) MG tablet, Take 1 tablet (325 mg total) by mouth daily with breakfast., Disp: , Rfl: 3 .  HYDROcodone-acetaminophen (NORCO) 7.5-325 MG tablet, Take 1 tablet by mouth 3 (three) times daily as needed (pain). , Disp: , Rfl:  .  ipratropium-albuterol (DUONEB) 0.5-2.5 (3) MG/3ML SOLN, INHALE THE CONTENTS OF 1 VIAL  VIA NEBULIZER EVERY 4 HOURS AS NEEDED, Disp: 540 mL, Rfl: 0 .  JANUVIA 50 MG tablet, TAKE 1 TABLET (50 MG TOTAL) BY MOUTH DAILY., Disp: 90 tablet, Rfl: 1 .  lipase/protease/amylase (CREON) 36000 UNITS CPEP capsule, TAKE 3 CAPSULES WITH MEALS  AND TAKE 1 CAPSULE WITH A SNACK AS DIRECTED, Disp: 900 capsule, Rfl: 2 .  Multiple Vitamins-Minerals (PRESERVISION AREDS 2) CAPS, Take 1 capsule by mouth daily. , Disp: , Rfl:  .  nitroGLYCERIN (NITROSTAT) 0.4 MG SL tablet, PLACE 1 TABLET (0.4 MG) UNDER THE TONGUE EVERY 5 MINUTES AS NEEDED FOR CHEST PAIN AS DIRECTED., Disp: 25 tablet, Rfl: 3 .  omeprazole (PRILOSEC) 40 MG capsule, Take 40 mg by mouth daily., Disp: , Rfl:  .  OXYGEN, Inhale 2 L into the lungs at bedtime. 2lpm with sleep only , Disp: , Rfl:  .  pantoprazole (PROTONIX) 40 MG tablet, TAKE 1 TABLET TWICE DAILY, Disp: 180 tablet, Rfl: 1 .  polyethylene glycol powder (GLYCOLAX/MIRALAX) powder, Take 17 g by mouth once., Disp: , Rfl:  .  potassium chloride SA (K-DUR,KLOR-CON) 20 MEQ tablet, TAKE 1 TABLET EVERY MORNING, Disp: 90 tablet, Rfl: 0 .  Probiotic Product ([place]) CAPS, Take 1 capsule by mouth daily., Disp: , Rfl:  .  simvastatin (ZOCOR) 10 MG tablet, TAKE 1 TABLET EVERY DAY, Disp: 90 tablet, Rfl: 1 .  TRUE METRIX BLOOD GLUCOSE TEST test strip, TEST TWO TIMES DAILY AS DIRECTED, Disp: 200 each, Rfl: 11 .  TRUEPLUS LANCETS 28G MISC, TEST BLOOD SUGAR TWICE DAILY, Disp: 200 each, Rfl: 1 .  VENTOLIN HFA 108 (90 Base) MCG/ACT inhaler, USE 2 PUFFS EVERY 6 HOURS AS NEEDED, Disp: 18 g, Rfl: 4 .  warfarin (COUMADIN) 2 MG tablet, TAKE 1/2  TABLET (1 MG TOTAL) DAILY AT 6 PM, Disp: 90 tablet, Rfl: 1 Social History   Socioeconomic History  . Marital status: Single    Spouse name: Not on file  . Number of children: 2  . Years of education: 8  . Highest education level: 8th grade  Occupational History  . Occupation: RETIRED    Employer: RETIRED    Comment: [employer]  Social Needs  .  Financial resource strain: Not hard at all  . Food insecurity:    Worry: Never true    Inability: Never true  . Transportation needs:    Medical: No    Non-medical: No  Tobacco Use  . Smoking status: Current Every Day Smoker    Packs/day: 0.25    Years: 45.00    Pack years: 11.25    Types: Cigarettes    Start date: 02/05/1958  . Smokeless tobacco: Never Used  . Tobacco comment: smokes 5-6 cigarettes daily  Substance and Sexual Activity  . Alcohol use: No    Alcohol/week: 0.0 standard drinks  . Drug use: No  . Sexual activity: Not Currently  Lifestyle  . Physical activity:    Days per week: 0  days    Minutes per session: 0 min  . Stress: To some extent  Relationships  . Social connections:    Talks on phone: More than three times a week    Gets together: More than three times a week    Attends religious service: Never    Active member of club or organization: No    Attends meetings of clubs or organizations: Never    Relationship status: Never married  . Intimate partner violence:    Fear of current or ex partner: No    Emotionally abused: No    Physically abused: No    Forced sexual activity: No  Other Topics Concern  . Not on file  Social History Narrative  . Not on file   Family History  Problem Relation Age of Onset  . Cancer Mother        cervical  . Stroke Mother   . Cancer Daughter 68       kidney, lung  . Heart disease Father   . Heart attack Father   . Ulcers Father   . Early death Sister 1       died at 83 months old  . Hypertension Brother   . Cancer Paternal Aunt        breast  . Hypertension Son   . Colon cancer Neg Hx     Objective: Office vital signs reviewed. BP 138/68   Pulse 70   Temp 97.8 F (36.6 C) (Oral)   Ht 5\' 2"  (1.575 m)   Wt 108 lb (49 kg)   BMI 19.75 kg/m   Physical Examination:  General: Awake, alert, thin, chronically ill appearing female, No acute distress Cardio: Bradycardic with regular rhythm.  S1-S2 heard.  No  murmurs appreciated. Pulm: clear to auscultation bilaterally, no wheezes, rhonchi or rales; normal work of breathing on room air Skin: Large ecchymosis noted along the left posterior lateral hip  Assessment/ Plan: 85 y.o. female   1. Permanent atrial fibrillation Rate controlled.  INR is subtherapeutic at 1.9 today.  I have recommended that she resume 2 mg q. Friday with 1 mg daily all other days.  Recheck in 1 week.   - CoaguChek XS/INR Waived  2. Chronic anticoagulation As above - CoaguChek XS/INR Waived   Orders Placed This Encounter  Procedures  . Bayer DCA Hb A1c Waived   No orders of the defined types were placed in this encounter.    Janora Norlander, DO [place] [phone]

## 2018-11-28 NOTE — Patient Instructions (Signed)
Your Coumadin level is a little low.  I want you to go back on Coumadin 2mg  on Fridays and 1mg  daily all the rest of the days.  Recheck in 1 week.

## 2018-11-29 LAB — COAGUCHEK XS/INR WAIVED
INR: 1.9 — ABNORMAL HIGH (ref 0.9–1.1)
Prothrombin Time: 22.4 s

## 2018-11-30 ENCOUNTER — Telehealth: Payer: Self-pay | Admitting: Nurse Practitioner

## 2018-11-30 DIAGNOSIS — Z950 Presence of cardiac pacemaker: Secondary | ICD-10-CM

## 2018-11-30 DIAGNOSIS — I5033 Acute on chronic diastolic (congestive) heart failure: Secondary | ICD-10-CM

## 2018-11-30 NOTE — Progress Notes (Signed)
Remote pacemaker transmission.   

## 2018-11-30 NOTE — Telephone Encounter (Signed)
Pt aware these medicines were sent this past week

## 2018-12-05 ENCOUNTER — Other Ambulatory Visit: Payer: Self-pay

## 2018-12-06 ENCOUNTER — Ambulatory Visit (INDEPENDENT_AMBULATORY_CARE_PROVIDER_SITE_OTHER): Payer: Medicare HMO | Admitting: *Deleted

## 2018-12-06 ENCOUNTER — Encounter: Payer: Self-pay | Admitting: Nurse Practitioner

## 2018-12-06 ENCOUNTER — Encounter: Payer: Self-pay | Admitting: *Deleted

## 2018-12-06 ENCOUNTER — Other Ambulatory Visit: Payer: Self-pay

## 2018-12-06 ENCOUNTER — Ambulatory Visit (INDEPENDENT_AMBULATORY_CARE_PROVIDER_SITE_OTHER): Payer: Medicare HMO | Admitting: Nurse Practitioner

## 2018-12-06 VITALS — BP 130/65 | HR 66 | Temp 97.2°F | Ht 62.0 in | Wt 108.0 lb

## 2018-12-06 DIAGNOSIS — I4821 Permanent atrial fibrillation: Secondary | ICD-10-CM | POA: Diagnosis not present

## 2018-12-06 DIAGNOSIS — Z0000 Encounter for general adult medical examination without abnormal findings: Secondary | ICD-10-CM

## 2018-12-06 LAB — COAGUCHEK XS/INR WAIVED
INR: 3.3 — ABNORMAL HIGH (ref 0.9–1.1)
Prothrombin Time: 39.7 s

## 2018-12-06 NOTE — Progress Notes (Addendum)
MEDICARE ANNUAL WELLNESS VISIT  12/06/2018  Telephone Visit Disclaimer This Medicare AWV was conducted by telephone due to national recommendations for restrictions regarding the COVID-19 Pandemic (e.g. social distancing).  I verified, using two identifiers, that I am speaking with [name] or their authorized healthcare agent. I discussed the limitations, risks, security, and privacy concerns of performing an evaluation and management service by telephone and the potential availability of an in-person appointment in the future. The patient expressed understanding and agreed to proceed.   Subjective:  [name] is a 84 y.o. female patient of [name], [name] who had a Medicare Annual Wellness Visit today via telephone. [name] worked at [employer] until she went out of work on disability in 1993 after a hernia operation and various health problems.   she has 2 children, 6 grandchildren, 75 great grandchildren, and 1 great great grandchild.  One of her great grand daughters and great great grandson live with her.  she reports that she is socially active and does interact with friends/family regularly. she is minimally physically active and enjoys watching television.  Patient Care Team: [name], [place] as PCP - General (Nurse Practitioner) Herminio Commons, MD as PCP - Cardiology (Cardiology) Thompson Grayer, MD as PCP - Electrophysiology (Cardiology) Gala Romney, Cristopher Estimable, MD (Gastroenterology) Thompson Grayer, MD as Consulting Physician (Cardiology) Steffanie Rainwater, DPM as Consulting Physician (Podiatry) Danie Binder, MD as Consulting Physician (Gastroenterology) Herminio Commons, MD as Attending Physician (Cardiology) Okey Regal, OD as Consulting Physician (Optometry)  Advanced Directives 12/06/2018 08/11/2018 08/07/2017 11/02/2016 07/30/2016 07/29/2016 11/25/2015  Does Patient Have a Medical Advance Directive? No No No No No No No  Would patient like  information on creating a medical advance directive? No - Patient declined No - Patient declined No - Patient declined No - Patient declined No - Patient declined No - Patient declined No - patient declined information  Pre-existing out of facility DNR order (yellow form or pink MOST form) - - - 47 - -    Hospital Utilization Over the Past 12 Months: # of hospitalizations or ER visits: 1 due to COPD exacerbation # of surgeries: 0  Review of Systems    Patient reports that her overall health is unchanged compared to last year.    All systems negative today as reported by patient.         Current Medications & Allergies (verified) Allergies as of 12/06/2018      Reactions   Macrobid [nitrofurantoin Monohyd Macro] Rash   Torsemide Nausea And Vomiting   Tramadol Nausea Only      Medication List       Accurate as of December 06, 2018  4:24 PM. Always use your most recent med list.        acetaminophen 500 MG tablet Commonly known as:  TYLENOL Take 1,000 mg by mouth every 6 (six) hours as needed for mild pain, moderate pain or fever.   budesonide-formoterol 160-4.5 MCG/ACT inhaler Commonly known as:  Symbicort INHALE 2 PUFFS TWICE DAILY   calcium-vitamin D 250-100 MG-UNIT tablet Take 2 tablets by mouth daily.   clonazePAM 0.5 MG tablet Commonly known as:  KLONOPIN TAKE 1 TABLET 3 TIMES DAILY AS NEEDED FOR ANXIETY   diltiazem 120 MG tablet Commonly known as:  CARDIZEM TAKE 1 TABLET EVERY DAY   ferrous sulfate 325 (65 FE) MG tablet Take 1 tablet (325 mg total) by mouth daily with breakfast.   HYDROcodone-acetaminophen 7.5-325 MG tablet Commonly known  as:  NORCO Take 1 tablet by mouth 3 (three) times daily as needed (pain).   ipratropium-albuterol 0.5-2.5 (3) MG/3ML Soln Commonly known as:  DUONEB INHALE THE CONTENTS OF 1 VIAL VIA NEBULIZER EVERY 4 HOURS AS NEEDED   Januvia 50 MG tablet Generic drug:  sitaGLIPtin TAKE 1 TABLET (50 MG TOTAL) BY MOUTH DAILY.    lipase/protease/amylase 36000 UNITS Cpep capsule Commonly known as:  Creon TAKE 3 CAPSULES WITH MEALS  AND TAKE 1 CAPSULE WITH A SNACK AS DIRECTED   nitroGLYCERIN 0.4 MG SL tablet Commonly known as:  NITROSTAT PLACE 1 TABLET (0.4 MG) UNDER THE TONGUE EVERY 5 MINUTES AS NEEDED FOR CHEST PAIN AS DIRECTED.   omeprazole 40 MG capsule Commonly known as:  PRILOSEC Take 40 mg by mouth daily.   OXYGEN Inhale 2 L into the lungs at bedtime. 2lpm with sleep only   pantoprazole 40 MG tablet Commonly known as:  PROTONIX TAKE 1 TABLET TWICE DAILY   Phillips Colon Health Caps Take 1 capsule by mouth daily.   polyethylene glycol powder 17 GM/SCOOP powder Commonly known as:  GLYCOLAX/MIRALAX Take 17 g by mouth once.   potassium chloride SA 20 MEQ tablet Commonly known as:  K-DUR TAKE 1 TABLET EVERY MORNING   PreserVision AREDS 2 Caps Take 1 capsule by mouth daily.   simvastatin 10 MG tablet Commonly known as:  ZOCOR TAKE 1 TABLET EVERY DAY   True Metrix Air Glucose Meter Devi 1 Device by Does not apply route 2 (two) times daily. Use to Test blood sugar bid. DX E13.10   True Metrix Blood Glucose Test test strip Generic drug:  glucose blood TEST TWO TIMES DAILY AS DIRECTED   True Metrix Level 1 Low Soln Use weekly   TRUEplus Lancets 28G Misc TEST BLOOD SUGAR TWICE DAILY   Ventolin HFA 108 (90 Base) MCG/ACT inhaler Generic drug:  albuterol USE 2 PUFFS EVERY 6 HOURS AS NEEDED   warfarin 2 MG tablet Commonly known as:  COUMADIN Take as directed by the anticoagulation clinic. If you are unsure how to take this medication, talk to your nurse or doctor. Original instructions:  TAKE 1/2  TABLET (1 MG TOTAL) DAILY AT 6 PM       History (reviewed): Past Medical History:  Diagnosis Date  . Abnormal CT of the chest    Mediastinal and hilar adenopathy followed by Dr. Koleen Nimrod in [place]  . Anemia, unspecified   . Body mass index (BMI) of 20.0-20.9 in adult FEB 2013 147 LBS  . CAD  (coronary artery disease) 2011   Catheterization August 2011: mild nonobstructive coronary artery disease complicated by large left rectus sheath hematoma status post evacuation Coumadin restarted without recurrence  . Carotid artery disease ([place])    Doppler, 05/2012, 0-39% bilateral  . Cataract   . Chronic airway obstruction, not elsewhere classified   . Chronic kidney disease, stage II (mild)   . Congestive heart failure, unspecified    EF now 60%, previous nonischemic cardiomyopathy  . Diabetes mellitus   . Dilated bile duct 10/19/2011  . Diverticulitis Dec 2012  . Gastritis    By EGD  . GERD (gastroesophageal reflux disease)   . Hypercholesterolemia   . Hypertension   . Mitral regurgitation 06/27/2013  . Neurogenic sleep apnea    Uses noctural oxygen prescribed by her pulmonologi  . Pacemaker    Single chamber [place]. Jude ACCENT [phone] SN 719 04/11/1957 10/31/2010  . Permanent atrial fibrillation    H/o tachybradycardia syndrome on  Coumadin anticoagulation, has pacemaker.  . Recurrent UTI   . Rheumatic heart disease   . Tachycardia-bradycardia [facility])    s/p St. Jude single chamber PPM 10/2010   . Unspecified hypertensive kidney disease with chronic kidney disease stage I through stage IV, or unspecified(403.90)   . Valvular heart disease    a. H/o moderate mitral stenosis by cath 2011 but no mention of this on 10/2011 echo. b. 10/2011 echo: mod MR, mild AS, mild TR; EF>65%  . Vitamin D deficiency    Past Surgical History:  Procedure Laterality Date  . CHOLECYSTECTOMY     40+ years ago  . COLONOSCOPY  10/2011   Dyann Ruddle sigmoid to descending diverticulosis, internal hemorrhoids  . ESOPHAGOGASTRODUODENOSCOPY  10/2011   Fields-erosive gastritis, biopsy with no H. pylori, hiatal hernia, peptic duodenitis, no celiac disease, duodenal diverticulum, duodenal nodule  . EUS  11/16/11   Mishra-13 MM CBD, normal pancreatic duct, otherwise normal EUS  . Evacuation of retroperitoneal and  left rectus sheath    . HEMATOMA EVACUATION     femoral  . HERNIA REPAIR     X3  . PILONIDAL CYST / SINUS EXCISION    . Single-chamber pacemaker implantation  3/12   by Dr Caryl Comes   Family History  Problem Relation Age of Onset  . Cancer Mother        cervical  . Stroke Mother   . Cancer Daughter 86       kidney, lung  . Heart disease Father   . Heart attack Father   . Ulcers Father   . Early death Sister 1       died at 16 months old  . Hypertension Brother   . Cancer Paternal Aunt        breast  . Hypertension Son   . Colon cancer Neg Hx    Social History   Socioeconomic History  . Marital status: Single    Spouse name: Not on file  . Number of children: 2  . Years of education: 8  . Highest education level: 8th grade  Occupational History  . Occupation: RETIRED    Employer: RETIRED    Comment: [employer]  Social Needs  . Financial resource strain: Not hard at all  . Food insecurity:    Worry: Never true    Inability: Never true  . Transportation needs:    Medical: No    Non-medical: No  Tobacco Use  . Smoking status: Current Every Day Smoker    Packs/day: 0.25    Years: 45.00    Pack years: 11.25    Types: Cigarettes    Start date: 02/05/1958  . Smokeless tobacco: Never Used  . Tobacco comment: smokes 5-6 cigarettes daily  Substance and Sexual Activity  . Alcohol use: No    Alcohol/week: 0.0 standard drinks  . Drug use: No  . Sexual activity: Not Currently  Lifestyle  . Physical activity:    Days per week: 0 days    Minutes per session: 0 min  . Stress: To some extent  Relationships  . Social connections:    Talks on phone: More than three times a week    Gets together: More than three times a week    Attends religious service: Never    Active member of club or organization: No    Attends meetings of clubs or organizations: Never    Relationship status: Never married  Other Topics Concern  . Not on file  Social History Narrative  .  Not on  file    Activities of Daily Living In your present state of health, do you have any difficulty performing the following activities: 12/06/2018 08/11/2018  Hearing? N N  Vision? N N  Difficulty concentrating or making decisions? N N  Walking or climbing stairs? Y N  Comment due to balance problems -  Dressing or bathing? N N  Doing errands, shopping? Y N  Comment Family provides transportation, patient does not drive Facilities manager and eating ? N -  Using the Toilet? N -  In the past six months, have you accidently leaked urine? N -  Do you have problems with loss of bowel control? N -  Managing your Medications? N -  Managing your Finances? N -  Housekeeping or managing your Housekeeping? N -  Some recent data might be hidden        Exercise Current Exercise Habits: Home exercise routine, Type of exercise: walking, Time (Minutes): 15, Intensity: Mild  Diet Patient reports consuming 2 meals a day and 1 snack(s) a day Patient reports that her primary diet is: Regular Patient reports that she does have regular access to food. Discussed healthy food choices.  Recommended patient adding peanut butter to her waffles at breakfast, and incorporating an Ensure into her diet daily.   Depression Screen PHQ 2/9 Scores 12/06/2018 11/02/2018 09/12/2018 09/07/2018 06/20/2018 05/30/2018 04/26/2018  PHQ - 2 Score 0 0 0 0 0 0 0  PHQ- 9 Score - - - - 0 - -     Fall Risk Fall Risk  12/06/2018 11/02/2018 09/12/2018 09/07/2018 06/20/2018  Falls in the past year? 1 0 0 0 0  Number falls in past yr: 0 - - - -  Comment - - - - -  Injury with Fall? 0 - - - -  Comment - - - - -  Risk Factor Category  - - - - -  Risk for fall due to : Impaired balance/gait;Impaired mobility - - - -  Follow up Education provided;Falls prevention discussed - - - -     Objective:  [name] seemed alert and oriented and she participated appropriately during our telephone visit.  Blood Pressure Weight BMI  BP  Readings from Last 3 Encounters:  12/06/18 130/65  11/28/18 138/68  11/02/18 131/84   Wt Readings from Last 3 Encounters:  12/06/18 108 lb (49 kg)  11/28/18 108 lb (49 kg)  11/02/18 111 lb (50.3 kg)   BMI Readings from Last 1 Encounters:  12/06/18 19.75 kg/m    *Unable to obtain current vital signs, weight, and BMI due to telephone visit type  Hearing/Vision  . Vida did not seem to have difficulty with hearing/understanding during the telephone conversation . Reports that she has had a formal eye exam by an eye care professional within the past year . Reports that she has not had a formal hearing evaluation within the past year *Unable to fully assess hearing and vision during telephone visit type  Cognitive Function: 6CIT Screen 12/06/2018  What Year? 0 points  What month? 0 points  What time? 0 points  Count back from 20 0 points  Months in reverse 0 points  Repeat phrase 2 points  Total Score 2    Normal Cognitive Function Screening: Yes (Normal:0-7, Significant for Dysfunction: >8)  Immunization & Health Maintenance Record Immunization History  Administered Date(s) Administered  . Influenza Split 05/12/2012  . Influenza, High Dose Seasonal PF 06/01/2016  . Influenza,inj,Quad PF,6+ Mos  05/11/2013, 06/21/2014, 05/23/2015, 05/25/2017, 06/21/2018  . Pneumococcal Conjugate-13 06/21/2014  . Pneumococcal Polysaccharide-23 06/30/1995, 10/04/2015  . Tdap 11/01/2015  . Zoster 10/12/2014    Health Maintenance  Topic Date Due  . DEXA SCAN  12/04/2017  . FOOT EXAM  11/12/2018  . OPHTHALMOLOGY EXAM  11/26/2018  . INFLUENZA VACCINE  03/11/2019  . HEMOGLOBIN A1C  05/19/2019  . TETANUS/TDAP  10/31/2025  . PNA vac Low Risk Adult  Completed       Assessment  This is a routine wellness examination for [name].  Health Maintenance: Due or Overdue Health Maintenance Due  Topic Date Due  . DEXA SCAN  Not indicated  . FOOT EXAM  11/12/2018  . OPHTHALMOLOGY  EXAM  11/26/2018    [name] has been referred previously for Chronic Case Management.  She is not currently participating in the program.   Plan:  Personalized Goals Goals Addressed            This Visit's Progress   . Quit Smoking (pt-stated)       Reduce amount of cigarettes smoked by one each week until quitting smoking completely.       Personalized Health Maintenance & Screening Recommendations  Advanced directives: has NO advanced directive - not interested in additional information  Lung Cancer Screening Recommended: not applicable (Low Dose CT Chest recommended if Age 39-80 years, 30 pack-year currently smoking OR have quit w/in past 15 years) Hepatitis C Screening recommended: not applicable   Advanced Directives: Written information was not prepared per patient's request.  Referrals & Orders n/a  Follow-up Plan . Follow-up with [name] Pretty, FNP as planned . Schedule diabetic eye exam when [facility] re-opens for regular business . Consider getting the Shingrix vaccine series in the future.  I have personally reviewed and noted the following in the patient's chart:   . Medical and social history . Use of alcohol, tobacco or illicit drugs  . Current medications and supplements . Functional ability and status . Nutritional status . Physical activity . Advanced directives . List of other physicians . Hospitalizations, surgeries, and ER visits in previous 12 months . Vitals . Screenings to include cognitive, depression, and falls . Referrals and appointments  In addition, I have reviewed and discussed with [name] certain preventive protocols, quality metrics, and best practice recommendations. A written personalized care plan for preventive services as well as general preventive health recommendations is available and can be mailed to the patient at her request.       Nolberto Hanlon, RN 12/06/2018   I have reviewed and agree  with the above AWV documentation.   [name], FNP

## 2018-12-06 NOTE — Patient Instructions (Addendum)
  Ms. Humes , Thank you for taking time to talk with me for your Medicare Wellness Visit. I appreciate your ongoing commitment to your health goals. Please review the following plan we discussed and let me know if I can assist you in the future.   These are the goal we discussed: Goals              . Quit Smoking (pt-stated)     Reduce amount of cigarettes smoked by one each week until quitting smoking completely.        This is a list of the screening recommended for you and due dates:  Health Maintenance  Topic Date Due  . DEXA scan (bone density measurement)  Completed  . Complete foot exam   11/12/2018  . Eye exam for diabetics  11/26/2018  . Flu Shot  03/11/2019  . Hemoglobin A1C  05/19/2019  . Tetanus Vaccine  10/31/2025  . Pneumonia vaccines  Completed

## 2018-12-06 NOTE — Progress Notes (Signed)
Subjective:   Chief Complaint: INR recheck   Indication: atrial fibrillation Bleeding signs/symptoms: None Thromboembolic signs/symptoms: None  Missed Coumadin doses: None Medication changes: no Dietary changes: no Bacterial/viral infection: no Other concerns: no  The following portions of the patient's history were reviewed and updated as appropriate: allergies, current medications, past family history, past medical history, past social history, past surgical history and problem list.  Review of Systems Pertinent items noted in HPI and remainder of comprehensive ROS otherwise negative.   Objective:    INR Today: 3.6 Current dose: coumadin 1mg  daily    Assessment:    Subtherapeutic INR for goal of 2-3   Plan:    1. New dose: coumadin 1mg  daily hold every Tuesday    2. Next INR: 2 weeks    ,[name], FNP

## 2018-12-08 DIAGNOSIS — Z9189 Other specified personal risk factors, not elsewhere classified: Secondary | ICD-10-CM | POA: Diagnosis not present

## 2018-12-08 DIAGNOSIS — R1031 Right lower quadrant pain: Secondary | ICD-10-CM | POA: Diagnosis not present

## 2018-12-08 DIAGNOSIS — G8929 Other chronic pain: Secondary | ICD-10-CM | POA: Diagnosis not present

## 2018-12-08 DIAGNOSIS — M5441 Lumbago with sciatica, right side: Secondary | ICD-10-CM | POA: Diagnosis not present

## 2018-12-15 ENCOUNTER — Other Ambulatory Visit: Payer: Self-pay | Admitting: Nurse Practitioner

## 2018-12-15 DIAGNOSIS — F419 Anxiety disorder, unspecified: Secondary | ICD-10-CM

## 2018-12-17 ENCOUNTER — Other Ambulatory Visit: Payer: Self-pay | Admitting: Nurse Practitioner

## 2018-12-20 ENCOUNTER — Other Ambulatory Visit: Payer: Self-pay

## 2018-12-20 ENCOUNTER — Ambulatory Visit: Payer: Medicare HMO | Admitting: Nurse Practitioner

## 2018-12-21 ENCOUNTER — Ambulatory Visit (INDEPENDENT_AMBULATORY_CARE_PROVIDER_SITE_OTHER): Payer: Medicare HMO | Admitting: Pharmacist Clinician (PhC)/ Clinical Pharmacy Specialist

## 2018-12-21 ENCOUNTER — Encounter: Payer: Self-pay | Admitting: Pharmacist Clinician (PhC)/ Clinical Pharmacy Specialist

## 2018-12-21 DIAGNOSIS — I4821 Permanent atrial fibrillation: Secondary | ICD-10-CM | POA: Diagnosis not present

## 2018-12-21 LAB — COAGUCHEK XS/INR WAIVED
INR: 2.4 — ABNORMAL HIGH (ref 0.9–1.1)
Prothrombin Time: 29.2 s

## 2018-12-21 NOTE — Patient Instructions (Signed)
Description   Continue taking 1mg  a day with none on Tuesdays.  INR today is 2.4  (goal 2-3)  Perfect reading

## 2018-12-28 ENCOUNTER — Telehealth: Payer: Self-pay | Admitting: Cardiology

## 2018-12-28 NOTE — Telephone Encounter (Signed)

## 2018-12-29 ENCOUNTER — Other Ambulatory Visit: Payer: Medicare HMO

## 2018-12-29 ENCOUNTER — Telehealth: Payer: Self-pay | Admitting: *Deleted

## 2018-12-29 ENCOUNTER — Telehealth: Payer: Self-pay

## 2018-12-29 DIAGNOSIS — Z20822 Contact with and (suspected) exposure to covid-19: Secondary | ICD-10-CM

## 2018-12-29 NOTE — Telephone Encounter (Signed)
Kisha Pinnix, LPN called from [facility] and says the patient had a temp in the office at 100.5 and Dr. Domenic Polite wants her tested for COVID-19. She says the patient is on her way to the testing site in [place]. I advised I will schedule the appointment for 1230 and call the patient, order placed.   I attempted to call the patient at the number listed in the chart, no answer, left VM to return call to [phone] concerning a test.

## 2018-12-29 NOTE — Telephone Encounter (Signed)
Pt presented to office for cardiac testing. Upon screening pt was noted to have temp of 100.5 with temporal thermometer. Pt states that she has cough but is dt COPD. Spoke with Lattie Haw, RN and appt made and order placed for COVID testing.

## 2018-12-30 ENCOUNTER — Encounter: Payer: Self-pay | Admitting: *Deleted

## 2018-12-30 NOTE — Telephone Encounter (Signed)
Pt went for COVID testing yesterday - wants work note for 3-5 days for testing time while she needs to remain quarantined - will forward to Dr Bronson Ing if ok to give

## 2018-12-30 NOTE — Telephone Encounter (Signed)
Left detailed message that we would provider letter for 5 days and to call back and let us know when they would pick up so that we could meet them at the door with letter (letter in [place])

## 2018-12-30 NOTE — Telephone Encounter (Signed)
Asking for Work note - stated no one called her back yesterday and she really needs for work

## 2018-12-30 NOTE — Telephone Encounter (Signed)
Ok to give note, unclear how long testing will take over the weekend. Can give note for 5 days   J BrancH MD

## 2018-12-30 NOTE — Telephone Encounter (Signed)
Letter in Epic

## 2018-12-30 NOTE — Telephone Encounter (Signed)
Letter needs to state Johnell Comings not the patient

## 2019-01-03 ENCOUNTER — Telehealth: Payer: Self-pay | Admitting: Cardiovascular Disease

## 2019-01-03 NOTE — Telephone Encounter (Signed)
Patient advised that result not available but lab would be contacted for status.

## 2019-01-03 NOTE — Telephone Encounter (Signed)
On hold for [facility] for greater than 11 minutes. Will retry later.

## 2019-01-03 NOTE — Telephone Encounter (Signed)
Patient requesting results of Coronavirus test / tg

## 2019-01-04 ENCOUNTER — Encounter: Payer: Self-pay | Admitting: *Deleted

## 2019-01-04 NOTE — Telephone Encounter (Signed)
Spoke with [employer], results will not be available for another 2-4 days. Made pt aware and that daughter could call office to pick up another letter for work (letter in [employer])

## 2019-01-04 NOTE — Telephone Encounter (Signed)
Patient called stating that she needs to know the results of COVID 93 States that her daughter is going to lose her job if they don't get the results.

## 2019-01-06 ENCOUNTER — Other Ambulatory Visit: Payer: Self-pay | Admitting: Cardiovascular Disease

## 2019-01-06 LAB — NOVEL CORONAVIRUS, NAA: SARS-CoV-2, NAA: NOT DETECTED

## 2019-01-09 ENCOUNTER — Other Ambulatory Visit: Payer: Self-pay | Admitting: Nurse Practitioner

## 2019-01-09 ENCOUNTER — Telehealth: Payer: Self-pay

## 2019-01-09 ENCOUNTER — Telehealth: Payer: Self-pay | Admitting: Nurse Practitioner

## 2019-01-09 NOTE — Telephone Encounter (Signed)
Pt aware, not having symptoms now

## 2019-01-09 NOTE — Telephone Encounter (Signed)
Patient returned call - advised of lab results.

## 2019-01-10 ENCOUNTER — Other Ambulatory Visit: Payer: Self-pay | Admitting: Nurse Practitioner

## 2019-01-10 DIAGNOSIS — F419 Anxiety disorder, unspecified: Secondary | ICD-10-CM

## 2019-01-13 ENCOUNTER — Encounter: Payer: Self-pay | Admitting: Nurse Practitioner

## 2019-01-13 ENCOUNTER — Telehealth: Payer: Self-pay | Admitting: Cardiovascular Disease

## 2019-01-13 ENCOUNTER — Ambulatory Visit (INDEPENDENT_AMBULATORY_CARE_PROVIDER_SITE_OTHER): Payer: Medicare HMO | Admitting: Nurse Practitioner

## 2019-01-13 ENCOUNTER — Other Ambulatory Visit: Payer: Self-pay

## 2019-01-13 ENCOUNTER — Other Ambulatory Visit: Payer: Self-pay | Admitting: Nurse Practitioner

## 2019-01-13 DIAGNOSIS — I2510 Atherosclerotic heart disease of native coronary artery without angina pectoris: Secondary | ICD-10-CM

## 2019-01-13 DIAGNOSIS — I5033 Acute on chronic diastolic (congestive) heart failure: Secondary | ICD-10-CM | POA: Diagnosis not present

## 2019-01-13 DIAGNOSIS — K219 Gastro-esophageal reflux disease without esophagitis: Secondary | ICD-10-CM

## 2019-01-13 DIAGNOSIS — E785 Hyperlipidemia, unspecified: Secondary | ICD-10-CM | POA: Diagnosis not present

## 2019-01-13 DIAGNOSIS — J449 Chronic obstructive pulmonary disease, unspecified: Secondary | ICD-10-CM

## 2019-01-13 DIAGNOSIS — I4821 Permanent atrial fibrillation: Secondary | ICD-10-CM

## 2019-01-13 DIAGNOSIS — I10 Essential (primary) hypertension: Secondary | ICD-10-CM

## 2019-01-13 DIAGNOSIS — N183 Chronic kidney disease, stage 3 unspecified: Secondary | ICD-10-CM

## 2019-01-13 DIAGNOSIS — E876 Hypokalemia: Secondary | ICD-10-CM

## 2019-01-13 DIAGNOSIS — Z9181 History of falling: Secondary | ICD-10-CM

## 2019-01-13 DIAGNOSIS — F419 Anxiety disorder, unspecified: Secondary | ICD-10-CM

## 2019-01-13 DIAGNOSIS — E1121 Type 2 diabetes mellitus with diabetic nephropathy: Secondary | ICD-10-CM

## 2019-01-13 DIAGNOSIS — E559 Vitamin D deficiency, unspecified: Secondary | ICD-10-CM

## 2019-01-13 DIAGNOSIS — D631 Anemia in chronic kidney disease: Secondary | ICD-10-CM

## 2019-01-13 DIAGNOSIS — Z720 Tobacco use: Secondary | ICD-10-CM

## 2019-01-13 DIAGNOSIS — N184 Chronic kidney disease, stage 4 (severe): Secondary | ICD-10-CM

## 2019-01-13 MED ORDER — DILTIAZEM HCL 120 MG PO TABS: 120.0000 mg | ORAL_TABLET | Freq: Every day | ORAL | 1 refills | Status: DC

## 2019-01-13 MED ORDER — OMEPRAZOLE 40 MG PO CPDR: 40.0000 mg | DELAYED_RELEASE_CAPSULE | Freq: Every day | ORAL | 1 refills | Status: DC

## 2019-01-13 MED ORDER — POTASSIUM CHLORIDE CRYS ER 20 MEQ PO TBCR: 20.0000 meq | EXTENDED_RELEASE_TABLET | Freq: Every morning | ORAL | 1 refills | Status: DC

## 2019-01-13 MED ORDER — BUDESONIDE-FORMOTEROL FUMARATE 160-4.5 MCG/ACT IN AERO: 2.0000 | INHALATION_SPRAY | Freq: Two times a day (BID) | RESPIRATORY_TRACT | 2 refills | Status: AC

## 2019-01-13 MED ORDER — PANTOPRAZOLE SODIUM 40 MG PO TBEC: 40.0000 mg | DELAYED_RELEASE_TABLET | Freq: Two times a day (BID) | ORAL | 1 refills | Status: DC

## 2019-01-13 MED ORDER — CLONAZEPAM 0.5 MG PO TABS: ORAL_TABLET | ORAL | 3 refills | Status: DC

## 2019-01-13 NOTE — Progress Notes (Signed)
Virtual Visit via telephone Note  I connected with [name] on 01/13/19 at 2:10 PM by video and verified that I am speaking with the correct person using two identifiers. [name] is currently located at home  and no one is currently with her during visit. The provider, [name] Hassell Done, FNP is located in their office at time of visit.  I discussed the limitations, risks, security and privacy concerns of performing an evaluation and management service by telephone and the availability of in person appointments. I also discussed with the patient that there may be a patient responsible charge related to this service. The patient expressed understanding and agreed to proceed.   History and Present Illness:   Chief Complaint: Medical Management of Chronic Issues    HPI:  1. Essential hypertension No c/o chest pain, sob or headache. Does not check blood pressure at home. BP Readings from Last 3 Encounters:  12/06/18 130/65  11/28/18 138/68  11/02/18 131/84     2. Hyperlipidemia with target LDL less than 100 She does not watch diet and does very little exercise.  3. Coronary artery disease involving native coronary artery of native heart without angina pectoris Has not seen cardiology in January 2020. Sh ewas suppose to have an U/S but she had fever and they refused to do test. They sent her for covid testing and it was negative. The U/S has not been rescheduled.  4. Acute on chronic diastolic CHF (congestive heart failure) (HCC) Has some swelling in leg sbut ususally resolves at night.  5. Permanent atrial fibrillation Denies palpitations or hear t racing  6. COPD GOLD O  Breathing is good right night. Has slight cough in mornings but is good throughout the day.  7. Type 2 diabetes mellitus with diabetic nephropathy, without long-term current use of insulin (HCC) Last HGBA1c was 5.2 her blood sugars have been running below 120 consistently. Denies any  hypoglycemia.  8. Gastroesophageal reflux disease without esophagitis Takes protonix daily. Still c/o right sided andmoinal pain, which has been worked up several times. All test have been negative.  9. Chronic kidney disease (CKD), stage IV (severe) (HCC) Last creatine was 0.94  10. Anemia due to stage 3 chronic kidney disease (HCC) Last hgb was 9.5. takes daily iron supplement  11. Anxiety Stays stressed . Is on klonopin TID. She gets very anxious if she does not take meds.  12. Vitamin D deficiency Takes daily vitamin d supplement  13. Tobacco abuse Smokes over a pack a day  14. At high risk for falls No recent falls- uses walker when walking    Outpatient Encounter Medications as of 01/13/2019  Medication Sig  . acetaminophen (TYLENOL) 500 MG tablet Take 1,000 mg by mouth every 6 (six) hours as needed for mild pain, moderate pain or fever.   . Blood Glucose Calibration (TRUE METRIX LEVEL 1) LOW SOLN Use weekly  . Blood Glucose Monitoring Suppl (TRUE METRIX AIR GLUCOSE METER) DEVI 1 Device by Does not apply route 2 (two) times daily. Use to Test blood sugar bid. DX E13.10  . budesonide-formoterol (SYMBICORT) 160-4.5 MCG/ACT inhaler INHALE 2 PUFFS TWICE DAILY  . calcium-vitamin D 250-100 MG-UNIT tablet Take 2 tablets by mouth daily.  . clonazePAM (KLONOPIN) 0.5 MG tablet TAKE 1 TABLET 3 TIMES DAILY AS NEEDED FOR ANXIETY  . diltiazem (CARDIZEM) 120 MG tablet TAKE 1 TABLET EVERY DAY  . ferrous sulfate 325 (65 FE) MG tablet Take 1 tablet (325 mg total) by mouth daily  with breakfast.  . HYDROcodone-acetaminophen (NORCO) 7.5-325 MG tablet Take 1 tablet by mouth 3 (three) times daily as needed (pain).   [name] ipratropium-albuterol (DUONEB) 0.5-2.5 (3) MG/3ML SOLN INHALE THE CONTENTS OF 1 VIAL VIA NEBULIZER EVERY 4 HOURS AS NEEDED  . JANUVIA 50 MG tablet TAKE 1 TABLET (50 MG TOTAL) BY MOUTH DAILY.  [name] lipase/protease/amylase (CREON) 36000 UNITS CPEP capsule TAKE 3 CAPSULES WITH MEALS  AND  TAKE 1 CAPSULE WITH A SNACK AS DIRECTED  . Multiple Vitamins-Minerals (PRESERVISION AREDS 2) CAPS Take 1 capsule by mouth daily.   . nitroGLYCERIN (NITROSTAT) 0.4 MG SL tablet DISSOLVE 1 TABLET UNDER THE TONGUE EVERY 5 MINUTES AS NEEDED FOR CHEST PAIN AS DIRECTED  . omeprazole (PRILOSEC) 40 MG capsule Take 40 mg by mouth daily.  . OXYGEN Inhale 2 L into the lungs at bedtime. 2lpm with sleep only   . pantoprazole (PROTONIX) 40 MG tablet TAKE 1 TABLET TWICE DAILY  . polyethylene glycol powder (GLYCOLAX/MIRALAX) powder Take 17 g by mouth once.  . potassium chloride SA (K-DUR,KLOR-CON) 20 MEQ tablet TAKE 1 TABLET EVERY MORNING  . Probiotic Product (Poteet) CAPS Take 1 capsule by mouth daily.  . simvastatin (ZOCOR) 10 MG tablet TAKE 1 TABLET EVERY DAY  . TRUE METRIX BLOOD GLUCOSE TEST test strip TEST TWO TIMES DAILY AS DIRECTED  . TRUEPLUS LANCETS 28G MISC TEST BLOOD SUGAR TWICE DAILY  . VENTOLIN HFA 108 (90 Base) MCG/ACT inhaler USE 2 PUFFS EVERY 6 HOURS AS NEEDED  . warfarin (COUMADIN) 2 MG tablet TAKE 1/2  TABLET (1 MG TOTAL) DAILY AT 6 PM       New complaints: None today  Social history:r liuves by herself. Her great granddaughter is currently living with her    Review of Systems  Constitutional: Negative for diaphoresis and weight loss.  Eyes: Negative for blurred vision, double vision and pain.  Respiratory: Negative for shortness of breath.   Cardiovascular: Negative for chest pain, palpitations, orthopnea and leg swelling.  Gastrointestinal: Negative for abdominal pain.  Musculoskeletal: Positive for back pain.  Skin: Negative for rash.  Neurological: Negative for dizziness, sensory change, loss of consciousness, weakness and headaches.  Endo/Heme/Allergies: Negative for polydipsia. Does not bruise/bleed easily.  Psychiatric/Behavioral: Negative for memory loss. The patient does not have insomnia.   All other systems reviewed and are negative.       Observations/Objective: Alert and oriented No distress noted  Assessment and Plan: [name] comes in today with chief complaint of Medical Management of Chronic Issues   Diagnosis and orders addressed:  1. Essential hypertension Low sodium  2. Hyperlipidemia with target LDL less than 100 Low fat diet  3. Coronary artery disease involving native coronary artery of native heart without angina pectoris Need to follow up with cardiology Call nad have echo cardiogram rescheduled  4. Acute on chronic diastolic CHF (congestive heart failure) (HCC) - diltiazem (CARDIZEM) 120 MG tablet; Take 1 tablet (120 mg total) by mouth daily.  Dispense: 90 tablet; Refill: 1  5. Permanent atrial fibrillation  6. COPD GOLD O  Need to stop smoking Continue inhalers  7. Type 2 diabetes mellitus with diabetic nephropathy, without long-term current use of insulin (HCC) Carb counting encouraged Continue to check blood sugars daily  8. Gastroesophageal reflux disease without esophagitis Avoid spicy foods Do not eat 2 hours prior to bedtime  9. Chronic kidney disease (CKD), stage IV (severe) (HCC) Will check creatine at next visit  10. Anemia due to stage 3  chronic kidney disease ([place]) Will check hgb at next visit  11. Anxiety Stress management - clonazePAM (KLONOPIN) 0.5 MG tablet; TAKE 1 TABLET 3 TIMES DAILY AS NEEDED FOR ANXIETY  Dispense: 90 tablet; Refill: 3  12. Vitamin D deficiency Daily vitamin d supplement  13. Tobacco abuse smoking cessation encouraged - budesonide-formoterol (SYMBICORT) 160-4.5 MCG/ACT inhaler; Inhale 2 puffs into the lungs 2 (two) times daily.  Dispense: 3 Inhaler; Refill: 2  14. At high risk for falls Fall precautions  15. Hypokalemia - potassium chloride SA (K-DUR) 20 MEQ tablet; Take 1 tablet (20 mEq total) by mouth every morning.  Dispense: 90 tablet; Refill: 1   Previous lab results reviewed Health Maintenance reviewed Diet and exercise  encouraged  Follow up plan: 3 months     I discussed the assessment and treatment plan with the patient. The patient was provided an opportunity to ask questions and all were answered. The patient agreed with the plan and demonstrated an understanding of the instructions.   The patient was advised to call back or seek an in-person evaluation if the symptoms worsen or if the condition fails to improve as anticipated.  The above assessment and management plan was discussed with the patient. The patient verbalized understanding of and has agreed to the management plan. Patient is aware to call the clinic if symptoms persist or worsen. Patient is aware when to return to the clinic for a follow-up visit. Patient educated on when it is appropriate to go to the emergency department.   Time call ended: 2:35  I provided 25 minutes of face-to-face time during this encounter.    [name] Hassell Done, FNP

## 2019-01-13 NOTE — Telephone Encounter (Signed)
°  Precert needed for: Carotid & echo   Location: [facility]    Date: February 15, 2019

## 2019-01-17 ENCOUNTER — Telehealth: Payer: Self-pay | Admitting: Nurse Practitioner

## 2019-01-17 DIAGNOSIS — E1121 Type 2 diabetes mellitus with diabetic nephropathy: Secondary | ICD-10-CM

## 2019-01-17 NOTE — Telephone Encounter (Signed)
What is the name of the medication? JANUVIA  Have you contacted your pharmacy to request a refill?yes  Which pharmacy would you like this sent to? [facility]   Patient notified that their request is being sent to the clinical staff for review and that they should receive a call once it is complete. If they do not receive a call within 24 hours they can check with their pharmacy or our office.

## 2019-01-17 NOTE — Telephone Encounter (Signed)
LMOVM refill was sent to pharmacy on 01/16/19

## 2019-02-04 ENCOUNTER — Other Ambulatory Visit: Payer: Self-pay | Admitting: Nurse Practitioner

## 2019-02-08 ENCOUNTER — Encounter: Payer: Self-pay | Admitting: Pharmacist Clinician (PhC)/ Clinical Pharmacy Specialist

## 2019-02-15 ENCOUNTER — Other Ambulatory Visit: Payer: Medicare HMO

## 2019-02-24 ENCOUNTER — Telehealth: Payer: Self-pay

## 2019-02-24 ENCOUNTER — Ambulatory Visit (INDEPENDENT_AMBULATORY_CARE_PROVIDER_SITE_OTHER): Payer: Medicare HMO | Admitting: *Deleted

## 2019-02-24 DIAGNOSIS — I495 Sick sinus syndrome: Secondary | ICD-10-CM

## 2019-02-24 NOTE — Telephone Encounter (Signed)
Left message for patient to remind of missed remote transmission.  

## 2019-02-28 LAB — CUP PACEART REMOTE DEVICE CHECK
Date Time Interrogation Session: 20200721070640
Implantable Lead Implant Date: 20120323
Implantable Lead Location: 753860
Implantable Lead Model: 1948
Implantable Pulse Generator Implant Date: 20120323
Pulse Gen Model: 1110
Pulse Gen Serial Number: 7199163

## 2019-03-01 ENCOUNTER — Encounter: Payer: Self-pay | Admitting: Cardiology

## 2019-03-01 NOTE — Progress Notes (Signed)
Remote pacemaker transmission.   

## 2019-03-04 ENCOUNTER — Other Ambulatory Visit: Payer: Self-pay | Admitting: Nurse Practitioner

## 2019-03-09 ENCOUNTER — Ambulatory Visit: Payer: Medicare HMO | Admitting: Cardiovascular Disease

## 2019-03-10 ENCOUNTER — Telehealth: Payer: Self-pay | Admitting: *Deleted

## 2019-03-10 MED ORDER — DENOSUMAB 60 MG/ML ~~LOC~~ SOSY: 60.0000 mg | PREFILLED_SYRINGE | Freq: Once | SUBCUTANEOUS | 0 refills | Status: AC

## 2019-03-10 NOTE — Telephone Encounter (Signed)
Pt due for Prolia inj Ordered thru mail order appt scheduled for INR and to rck Calcium level Pt notified

## 2019-03-15 ENCOUNTER — Other Ambulatory Visit: Payer: Self-pay

## 2019-03-16 ENCOUNTER — Ambulatory Visit (INDEPENDENT_AMBULATORY_CARE_PROVIDER_SITE_OTHER): Payer: Medicare HMO | Admitting: Nurse Practitioner

## 2019-03-16 ENCOUNTER — Encounter: Payer: Self-pay | Admitting: Nurse Practitioner

## 2019-03-16 ENCOUNTER — Other Ambulatory Visit: Payer: Self-pay

## 2019-03-16 VITALS — BP 123/55 | HR 56 | Temp 97.7°F | Ht 62.0 in | Wt 105.0 lb

## 2019-03-16 DIAGNOSIS — I4821 Permanent atrial fibrillation: Secondary | ICD-10-CM

## 2019-03-16 LAB — CMP14+EGFR
ALT: 19 IU/L (ref 0–32)
AST: 31 IU/L (ref 0–40)
Albumin/Globulin Ratio: 1.2 (ref 1.2–2.2)
Albumin: 3.6 g/dL (ref 3.6–4.6)
Alkaline Phosphatase: 115 IU/L (ref 39–117)
BUN/Creatinine Ratio: 14 (ref 12–28)
BUN: 20 mg/dL (ref 8–27)
Bilirubin Total: 0.4 mg/dL (ref 0.0–1.2)
CO2: 27 mmol/L (ref 20–29)
Calcium: 9.9 mg/dL (ref 8.7–10.3)
Chloride: 100 mmol/L (ref 96–106)
Creatinine, Ser: 1.45 mg/dL — ABNORMAL HIGH (ref 0.57–1.00)
GFR calc Af Amer: 39 mL/min/{1.73_m2} — ABNORMAL LOW (ref >59)
GFR calc non Af Amer: 34 mL/min/{1.73_m2} — ABNORMAL LOW (ref >59)
Globulin, Total: 2.9 g/dL (ref 1.5–4.5)
Glucose: 146 mg/dL — ABNORMAL HIGH (ref 65–99)
Potassium: 4.3 mmol/L (ref 3.5–5.2)
Sodium: 141 mmol/L (ref 134–144)
Total Protein: 6.5 g/dL (ref 6.0–8.5)

## 2019-03-16 LAB — COAGUCHEK XS/INR WAIVED
INR: 1.7 — ABNORMAL HIGH (ref 0.9–1.1)
Prothrombin Time: 20.6 s

## 2019-03-16 NOTE — Progress Notes (Signed)
Subjective:   Chief Complaint: INR recheck   Indication: atrial fibrillation Bleeding signs/symptoms: None Thromboembolic signs/symptoms: None  Missed Coumadin doses: None Medication changes: no Dietary changes: no Bacterial/viral infection: no Other concerns: no  The following portions of the patient's history were reviewed and updated as appropriate: allergies, current medications, past medical history, past social history, past surgical history and problem list.  Review of Systems Pertinent items noted in HPI and remainder of comprehensive ROS otherwise negative.   Objective:    INR Today: 1.7 Current dose: coumadin 1mg  daily except none on tuesdays    Assessment:    Subtherapeutic INR for goal of 2.5-3.5   Plan:    1. New dose: coumadin 1mg  daily except 2mg  on thursday   2. Next INR: 2 weeks    [name], FNP

## 2019-03-21 ENCOUNTER — Telehealth: Payer: Self-pay | Admitting: *Deleted

## 2019-03-27 ENCOUNTER — Other Ambulatory Visit: Payer: Self-pay | Admitting: Nurse Practitioner

## 2019-03-29 ENCOUNTER — Other Ambulatory Visit: Payer: Medicare HMO

## 2019-03-30 ENCOUNTER — Ambulatory Visit: Payer: Medicare HMO | Admitting: Cardiovascular Disease

## 2019-03-30 ENCOUNTER — Ambulatory Visit: Payer: Self-pay | Admitting: Nurse Practitioner

## 2019-03-31 ENCOUNTER — Other Ambulatory Visit: Payer: Self-pay | Admitting: Nurse Practitioner

## 2019-03-31 DIAGNOSIS — Z950 Presence of cardiac pacemaker: Secondary | ICD-10-CM

## 2019-03-31 DIAGNOSIS — E785 Hyperlipidemia, unspecified: Secondary | ICD-10-CM

## 2019-04-03 ENCOUNTER — Ambulatory Visit: Payer: Medicare HMO | Admitting: Nurse Practitioner

## 2019-04-19 ENCOUNTER — Ambulatory Visit (INDEPENDENT_AMBULATORY_CARE_PROVIDER_SITE_OTHER): Payer: Medicare HMO | Admitting: Family Medicine

## 2019-04-19 ENCOUNTER — Encounter: Payer: Self-pay | Admitting: Family Medicine

## 2019-04-19 DIAGNOSIS — J441 Chronic obstructive pulmonary disease with (acute) exacerbation: Secondary | ICD-10-CM | POA: Diagnosis not present

## 2019-04-19 MED ORDER — PREDNISONE 10 MG PO TABS: ORAL_TABLET | ORAL | 0 refills | Status: DC

## 2019-04-19 NOTE — Progress Notes (Signed)
Subjective:    Patient ID: [name], female    DOB: 1934/08/21, 84 y.o.   MRN: [id number]  She also  HPI: [name] is a 84 y.o. female presenting for generalized weakness for a week. She has had anemia. She is also due for INR check in the office with Ms. Hassell Done for her atrial fibrillation. Denies melena, hematochezia, hematuria. Also denies chest pain. Mild dyspnea. Feels like her COPD is flaring. Some cough. Occasionaly productive.   Depression screen [facility] 2/9 12/06/2018 11/02/2018 09/12/2018 09/07/2018 06/20/2018  Decreased Interest 0 0 0 0 0  Down, Depressed, Hopeless 0 0 0 0 0  PHQ - 2 Score 0 0 0 0 0  Altered sleeping - - - - 0  Tired, decreased energy - - - - 0  Change in appetite - - - - 0  Feeling bad or failure about yourself  - - - - 0  Trouble concentrating - - - - 0  Moving slowly or fidgety/restless - - - - 0  Suicidal thoughts - - - - 0  PHQ-9 Score - - - - 0  Difficult doing work/chores - - - - Not difficult at all  Some recent data might be hidden     Relevant past medical, surgical, family and social history reviewed and updated as indicated.  Interim medical history since our last visit reviewed. Allergies and medications reviewed and updated.  ROS:  Review of Systems  Constitutional: Positive for activity change (declining).  HENT: Positive for congestion.   Eyes: Negative for visual disturbance.  Respiratory: Positive for cough.   Cardiovascular: Negative for chest pain.  Gastrointestinal: Negative for abdominal pain, constipation, diarrhea, nausea and vomiting.  Genitourinary: Negative for difficulty urinating.  Musculoskeletal: Negative for arthralgias and myalgias.  Neurological: Negative for headaches.  Psychiatric/Behavioral: Negative for sleep disturbance.     Social History   Tobacco Use  Smoking Status Current Every Day Smoker   Packs/day: 0.25   Years: 45.00   Pack years: 11.25   Types: Cigarettes   Start date:  02/05/1958  Smokeless Tobacco Never Used  Tobacco Comment   smokes 5-6 cigarettes daily       Objective:     Wt Readings from Last 3 Encounters:  03/16/19 105 lb (47.6 kg)  12/06/18 108 lb (49 kg)  11/28/18 108 lb (49 kg)     Exam deferred. Pt. [place] due to COVID 19. Phone visit performed.   Assessment & Plan:   1. Acute exacerbation of chronic obstructive pulmonary disease (COPD) ([place])     Meds ordered this encounter  Medications   predniSONE (DELTASONE) 10 MG tablet    Sig: Take 5 daily for 2 days followed by 4,3,2 and 1 for 2 days each.    Dispense:  30 tablet    Refill:  0    No orders of the defined types were placed in this encounter.     Diagnoses and all orders for this visit:  Acute exacerbation of chronic obstructive pulmonary disease (COPD) ([place])  Other orders -     predniSONE (DELTASONE) 10 MG tablet; Take 5 daily for 2 days followed by 4,3,2 and 1 for 2 days each.    Virtual Visit via telephone Note  I discussed the limitations, risks, security and privacy concerns of performing an evaluation and management service by telephone and the availability of in person appointments. The patient was identified with two identifiers. Pt.expressed understanding and agreed to proceed. Pt. Is at home.  Dr. Livia Snellen is in his office.  Follow Up Instructions:   I discussed the assessment and treatment plan with the patient. The patient was provided an opportunity to ask questions and all were answered. The patient agreed with the plan and demonstrated an understanding of the instructions.   The patient was advised to call back or seek an in-person evaluation if the symptoms worsen or if the condition fails to improve as anticipated.   Total minutes including chart review and phone contact time: 25   Follow up plan: Return in about 1 week (around 04/26/2019) for INR, with Ms. Hassell Done, diabetes.  Claretta Fraise, MD [place]

## 2019-04-25 ENCOUNTER — Inpatient Hospital Stay (HOSPITAL_COMMUNITY)
Admission: EM | Admit: 2019-04-25 | Discharge: 2019-04-27 | DRG: 190 | Disposition: A | Discharge status: Home Health | Payer: Medicare HMO | Attending: Internal Medicine | Admitting: Internal Medicine

## 2019-04-25 ENCOUNTER — Encounter (HOSPITAL_COMMUNITY): Payer: Self-pay | Admitting: Emergency Medicine

## 2019-04-25 ENCOUNTER — Other Ambulatory Visit: Payer: Self-pay

## 2019-04-25 ENCOUNTER — Emergency Department (HOSPITAL_COMMUNITY): Payer: Medicare HMO

## 2019-04-25 ENCOUNTER — Encounter: Payer: Self-pay | Admitting: Nurse Practitioner

## 2019-04-25 ENCOUNTER — Other Ambulatory Visit: Payer: Self-pay | Admitting: Nurse Practitioner

## 2019-04-25 ENCOUNTER — Ambulatory Visit (INDEPENDENT_AMBULATORY_CARE_PROVIDER_SITE_OTHER): Payer: Medicare HMO | Admitting: Nurse Practitioner

## 2019-04-25 DIAGNOSIS — R519 Headache, unspecified: Secondary | ICD-10-CM

## 2019-04-25 DIAGNOSIS — E1122 Type 2 diabetes mellitus with diabetic chronic kidney disease: Secondary | ICD-10-CM | POA: Diagnosis present

## 2019-04-25 DIAGNOSIS — Z9981 Dependence on supplemental oxygen: Secondary | ICD-10-CM

## 2019-04-25 DIAGNOSIS — F419 Anxiety disorder, unspecified: Secondary | ICD-10-CM | POA: Diagnosis present

## 2019-04-25 DIAGNOSIS — R791 Abnormal coagulation profile: Secondary | ICD-10-CM | POA: Diagnosis present

## 2019-04-25 DIAGNOSIS — I5033 Acute on chronic diastolic (congestive) heart failure: Secondary | ICD-10-CM | POA: Diagnosis present

## 2019-04-25 DIAGNOSIS — Z888 Allergy status to other drugs, medicaments and biological substances status: Secondary | ICD-10-CM

## 2019-04-25 DIAGNOSIS — R059 Cough, unspecified: Secondary | ICD-10-CM

## 2019-04-25 DIAGNOSIS — Z803 Family history of malignant neoplasm of breast: Secondary | ICD-10-CM

## 2019-04-25 DIAGNOSIS — J441 Chronic obstructive pulmonary disease with (acute) exacerbation: Principal | ICD-10-CM | POA: Diagnosis present

## 2019-04-25 DIAGNOSIS — Z20828 Contact with and (suspected) exposure to other viral communicable diseases: Secondary | ICD-10-CM | POA: Diagnosis present

## 2019-04-25 DIAGNOSIS — E1129 Type 2 diabetes mellitus with other diabetic kidney complication: Secondary | ICD-10-CM | POA: Diagnosis present

## 2019-04-25 DIAGNOSIS — D631 Anemia in chronic kidney disease: Secondary | ICD-10-CM | POA: Diagnosis present

## 2019-04-25 DIAGNOSIS — K219 Gastro-esophageal reflux disease without esophagitis: Secondary | ICD-10-CM | POA: Diagnosis present

## 2019-04-25 DIAGNOSIS — E876 Hypokalemia: Secondary | ICD-10-CM | POA: Diagnosis present

## 2019-04-25 DIAGNOSIS — R5383 Other fatigue: Secondary | ICD-10-CM | POA: Diagnosis not present

## 2019-04-25 DIAGNOSIS — R51 Headache: Secondary | ICD-10-CM

## 2019-04-25 DIAGNOSIS — R531 Weakness: Secondary | ICD-10-CM | POA: Diagnosis not present

## 2019-04-25 DIAGNOSIS — Z8051 Family history of malignant neoplasm of kidney: Secondary | ICD-10-CM

## 2019-04-25 DIAGNOSIS — D509 Iron deficiency anemia, unspecified: Secondary | ICD-10-CM | POA: Diagnosis present

## 2019-04-25 DIAGNOSIS — I2510 Atherosclerotic heart disease of native coronary artery without angina pectoris: Secondary | ICD-10-CM | POA: Diagnosis present

## 2019-04-25 DIAGNOSIS — E7800 Pure hypercholesterolemia, unspecified: Secondary | ICD-10-CM | POA: Diagnosis present

## 2019-04-25 DIAGNOSIS — N183 Chronic kidney disease, stage 3 unspecified: Secondary | ICD-10-CM | POA: Diagnosis present

## 2019-04-25 DIAGNOSIS — Z681 Body mass index (BMI) 19 or less, adult: Secondary | ICD-10-CM

## 2019-04-25 DIAGNOSIS — Z9049 Acquired absence of other specified parts of digestive tract: Secondary | ICD-10-CM

## 2019-04-25 DIAGNOSIS — Z79891 Long term (current) use of opiate analgesic: Secondary | ICD-10-CM

## 2019-04-25 DIAGNOSIS — J9621 Acute and chronic respiratory failure with hypoxia: Secondary | ICD-10-CM | POA: Diagnosis present

## 2019-04-25 DIAGNOSIS — F17200 Nicotine dependence, unspecified, uncomplicated: Secondary | ICD-10-CM | POA: Diagnosis present

## 2019-04-25 DIAGNOSIS — R05 Cough: Secondary | ICD-10-CM

## 2019-04-25 DIAGNOSIS — Z8249 Family history of ischemic heart disease and other diseases of the circulatory system: Secondary | ICD-10-CM

## 2019-04-25 DIAGNOSIS — Z7952 Long term (current) use of systemic steroids: Secondary | ICD-10-CM

## 2019-04-25 DIAGNOSIS — I130 Hypertensive heart and chronic kidney disease with heart failure and stage 1 through stage 4 chronic kidney disease, or unspecified chronic kidney disease: Secondary | ICD-10-CM | POA: Diagnosis present

## 2019-04-25 DIAGNOSIS — I5032 Chronic diastolic (congestive) heart failure: Secondary | ICD-10-CM | POA: Diagnosis present

## 2019-04-25 DIAGNOSIS — Z950 Presence of cardiac pacemaker: Secondary | ICD-10-CM

## 2019-04-25 DIAGNOSIS — R64 Cachexia: Secondary | ICD-10-CM | POA: Diagnosis present

## 2019-04-25 DIAGNOSIS — Z801 Family history of malignant neoplasm of trachea, bronchus and lung: Secondary | ICD-10-CM

## 2019-04-25 DIAGNOSIS — I447 Left bundle-branch block, unspecified: Secondary | ICD-10-CM | POA: Diagnosis present

## 2019-04-25 DIAGNOSIS — I340 Nonrheumatic mitral (valve) insufficiency: Secondary | ICD-10-CM | POA: Diagnosis present

## 2019-04-25 DIAGNOSIS — I495 Sick sinus syndrome: Secondary | ICD-10-CM | POA: Diagnosis present

## 2019-04-25 DIAGNOSIS — I428 Other cardiomyopathies: Secondary | ICD-10-CM | POA: Diagnosis present

## 2019-04-25 DIAGNOSIS — D539 Nutritional anemia, unspecified: Secondary | ICD-10-CM | POA: Diagnosis present

## 2019-04-25 DIAGNOSIS — Z7901 Long term (current) use of anticoagulants: Secondary | ICD-10-CM

## 2019-04-25 DIAGNOSIS — Z79899 Other long term (current) drug therapy: Secondary | ICD-10-CM

## 2019-04-25 DIAGNOSIS — I4821 Permanent atrial fibrillation: Secondary | ICD-10-CM | POA: Diagnosis present

## 2019-04-25 DIAGNOSIS — Z823 Family history of stroke: Secondary | ICD-10-CM

## 2019-04-25 DIAGNOSIS — I4891 Unspecified atrial fibrillation: Secondary | ICD-10-CM | POA: Diagnosis present

## 2019-04-25 DIAGNOSIS — Z7951 Long term (current) use of inhaled steroids: Secondary | ICD-10-CM

## 2019-04-25 DIAGNOSIS — I10 Essential (primary) hypertension: Secondary | ICD-10-CM | POA: Diagnosis present

## 2019-04-25 LAB — COMPREHENSIVE METABOLIC PANEL
ALT: 19 U/L (ref 0–44)
AST: 31 U/L (ref 15–41)
Albumin: 3.1 g/dL — ABNORMAL LOW (ref 3.5–5.0)
Alkaline Phosphatase: 80 U/L (ref 38–126)
Anion gap: 9 (ref 5–15)
BUN: 18 mg/dL (ref 8–23)
CO2: 32 mmol/L (ref 22–32)
Calcium: 9.4 mg/dL (ref 8.9–10.3)
Chloride: 102 mmol/L (ref 98–111)
Creatinine, Ser: 1.25 mg/dL — ABNORMAL HIGH (ref 0.44–1.00)
GFR calc Af Amer: 46 mL/min — ABNORMAL LOW (ref >60)
GFR calc non Af Amer: 40 mL/min — ABNORMAL LOW (ref >60)
Glucose, Bld: 106 mg/dL — ABNORMAL HIGH (ref 70–99)
Potassium: 3 mmol/L — ABNORMAL LOW (ref 3.5–5.1)
Sodium: 143 mmol/L (ref 135–145)
Total Bilirubin: 0.5 mg/dL (ref 0.3–1.2)
Total Protein: 6.9 g/dL (ref 6.5–8.1)

## 2019-04-25 LAB — CBC WITH DIFFERENTIAL/PLATELET
Abs Immature Granulocytes: 0.01 10*3/uL (ref 0.00–0.07)
Basophils Absolute: 0 10*3/uL (ref 0.0–0.1)
Basophils Relative: 1 %
Eosinophils Absolute: 0.1 10*3/uL (ref 0.0–0.5)
Eosinophils Relative: 1 %
HCT: 34.8 % — ABNORMAL LOW (ref 36.0–46.0)
Hemoglobin: 10.5 g/dL — ABNORMAL LOW (ref 12.0–15.0)
Immature Granulocytes: 0 %
Lymphocytes Relative: 20 %
Lymphs Abs: 1.6 10*3/uL (ref 0.7–4.0)
MCH: 32.2 pg (ref 26.0–34.0)
MCHC: 30.2 g/dL (ref 30.0–36.0)
MCV: 106.7 fL — ABNORMAL HIGH (ref 80.0–100.0)
Monocytes Absolute: 0.8 10*3/uL (ref 0.1–1.0)
Monocytes Relative: 10 %
Neutro Abs: 5.6 10*3/uL (ref 1.7–7.7)
Neutrophils Relative %: 68 %
Platelets: 211 10*3/uL (ref 150–400)
RBC: 3.26 MIL/uL — ABNORMAL LOW (ref 3.87–5.11)
RDW: 14.4 % (ref 11.5–15.5)
WBC: 8.2 10*3/uL (ref 4.0–10.5)
nRBC: 0 % (ref 0.0–0.2)

## 2019-04-25 LAB — TROPONIN I (HIGH SENSITIVITY): Troponin I (High Sensitivity): 14 ng/L (ref <18)

## 2019-04-25 LAB — PROTIME-INR
INR: 7.3 (ref 0.8–1.2)
Prothrombin Time: 61.2 seconds — ABNORMAL HIGH (ref 11.4–15.2)

## 2019-04-25 LAB — BRAIN NATRIURETIC PEPTIDE: B Natriuretic Peptide: 450 pg/mL — ABNORMAL HIGH (ref 0.0–100.0)

## 2019-04-25 MED ORDER — ALBUTEROL SULFATE HFA 108 (90 BASE) MCG/ACT IN AERS
6.0000 | INHALATION_SPRAY | Freq: Once | RESPIRATORY_TRACT | Status: AC
  Administered 2019-04-25: 23:00:00 6 via RESPIRATORY_TRACT
  Filled 2019-04-25: qty 6.7

## 2019-04-25 MED ORDER — METHYLPREDNISOLONE SODIUM SUCC 125 MG IJ SOLR
60.0000 mg | Freq: Once | INTRAMUSCULAR | Status: AC
  Administered 2019-04-25: 23:00:00 60 mg via INTRAVENOUS
  Filled 2019-04-25: qty 2

## 2019-04-25 NOTE — Progress Notes (Signed)
   Virtual Visit via telephone Note Due to COVID-19 pandemic this visit was conducted virtually. This visit type was conducted due to national recommendations for restrictions regarding the COVID-19 Pandemic (e.g. social distancing, sheltering in place) in an effort to limit this patient's exposure and mitigate transmission in our community. All issues noted in this document were discussed and addressed.  A physical exam was not performed with this format.  I connected with [name] on 04/25/19 at 5:35 by telephone and verified that I am speaking with the correct person using two identifiers. [name] is currently located at home and her granddaughter is currently with her during visit. The provider, [name], FNP is located in their office at time of visit.  I discussed the limitations, risks, security and privacy concerns of performing an evaluation and management service by telephone and the availability of in person appointments. I also discussed with the patient that there may be a patient responsible charge related to this service. The patient expressed understanding and agreed to proceed.   History and Present Illness:   Chief Complaint: Headache   HPI Called patient and she says she is so weak she can hardly go. She says she can hardly get out of the bed. She has a bad cough and feels SOB. Started over a week ago. She has a bad headache every  Morning when she gets up.    Review of Systems  Constitutional: Positive for chills and malaise/fatigue. Negative for fever.  HENT: Positive for congestion.   Respiratory: Positive for cough and shortness of breath.   Cardiovascular: Positive for chest pain.  Neurological: Positive for weakness and headaches.  All other systems reviewed and are negative.    Observations/Objective: Patient can hardly talk on phone Wet cough  Assessment and Plan: [name] in today with chief complaint of Headache    1. Cough  2. Fatigue, unspecified type  3. Weakness  4. Acute nonintractable headache, unspecified headache type Patient was told to go to the er Needs chest xray and blood work and possible covid checking   Follow Up Instructions: prn    I discussed the assessment and treatment plan with the patient. The patient was provided an opportunity to ask questions and all were answered. The patient agreed with the plan and demonstrated an understanding of the instructions.   The patient was advised to call back or seek an in-person evaluation if the symptoms worsen or if the condition fails to improve as anticipated.  The above assessment and management plan was discussed with the patient. The patient verbalized understanding of and has agreed to the management plan. Patient is aware to call the clinic if symptoms persist or worsen. Patient is aware when to return to the clinic for a follow-up visit. Patient educated on when it is appropriate to go to the emergency department.   Time call ended:  5:45  I provided 10 minutes of non-face-to-face time during this encounter.    [name], FNP

## 2019-04-25 NOTE — ED Notes (Signed)
CRITICAL VALUE ALERT  Critical Value: INR 7.3  Date & Time Notied: 04/25/2019 2334  Provider Notified: Dr. Wyvonnia Dusky  Orders Received/Actions taken: n/a

## 2019-04-25 NOTE — ED Triage Notes (Signed)
Pt c/o weakness all over, cough and sob at times x one week.

## 2019-04-25 NOTE — ED Provider Notes (Signed)
[facility] EMERGENCY DEPARTMENT Provider Note   CSN: [id number] Arrival date & time: 04/25/19  2113     History   Chief Complaint Chief Complaint  Patient presents with  . Weakness    HPI [name] is a 84 y.o. female.     Patient with history of COPD on oxygen at night, CHF with pacemaker in place, CAD, on Coumadin for history of atrial fibrillation presenting from home with a one-week history of generalized weakness, shortness of breath, cough and weakness.  She denies any fevers.  She states her cough is nonproductive.  She has not been using her nebulizer at home as frequently as usual.  She states she did not use it at all today.  Her cough is nonproductive.  She denies any chest pain or fever.  No abdominal pain, nausea or vomiting.  She was sent in by her PCP with a concern for pneumonia.  She denies any pain with urination or blood in the urine.  No recent coronavirus exposures.  No recent falls or trauma.  Chart review shows she is complaining of a headache earlier to her doctor which she now denies.  No neck pain or back pain.  No documented fever.  The history is provided by the patient.  Weakness Associated symptoms: arthralgias, cough, myalgias and shortness of breath   Associated symptoms: no abdominal pain, no chest pain, no dizziness, no dysuria, no fever, no headaches, no nausea and no vomiting     Past Medical History:  Diagnosis Date  . Abnormal CT of the chest    Mediastinal and hilar adenopathy followed by Dr. Koleen Nimrod in [place]  . Anemia, unspecified   . Body mass index (BMI) of 20.0-20.9 in adult FEB 2013 147 LBS  . CAD (coronary artery disease) 2011   Catheterization August 2011: mild nonobstructive coronary artery disease complicated by large left rectus sheath hematoma status post evacuation Coumadin restarted without recurrence  . Carotid artery disease ([place])    Doppler, 05/2012, 0-39% bilateral  . Cataract   . Chronic airway obstruction, not  elsewhere classified   . Chronic kidney disease, stage II (mild)   . Congestive heart failure, unspecified    EF now 60%, previous nonischemic cardiomyopathy  . Diabetes mellitus   . Dilated bile duct 10/19/2011  . Diverticulitis Dec 2012  . Gastritis    By EGD  . GERD (gastroesophageal reflux disease)   . Hypercholesterolemia   . Hypertension   . Mitral regurgitation 06/27/2013  . Neurogenic sleep apnea    Uses noctural oxygen prescribed by her pulmonologi  . Pacemaker    Single chamber [place]. Jude ACCENT [phone] SN 719 04/11/1957 10/31/2010  . Permanent atrial fibrillation    H/o tachybradycardia syndrome on Coumadin anticoagulation, has pacemaker.  . Recurrent UTI   . Rheumatic heart disease   . Tachycardia-bradycardia [facility])    s/p St. Jude single chamber PPM 10/2010   . Unspecified hypertensive kidney disease with chronic kidney disease stage I through stage IV, or unspecified(403.90)   . Valvular heart disease    a. H/o moderate mitral stenosis by cath 2011 but no mention of this on 10/2011 echo. b. 10/2011 echo: mod MR, mild AS, mild TR; EF>65%  . Vitamin D deficiency     Patient Active Problem List   Diagnosis Date Noted  . At high risk for falls 02/13/2016  . Chronic kidney disease (CKD), stage IV (severe) ([place]) 07/02/2015  . Hyperlipidemia with target LDL less than 100 12/31/2014  .  Osteoporosis, post-menopausal 11/01/2013  . GERD (gastroesophageal reflux disease) 07/13/2013  . Aortic stenosis 06/27/2013  . Acute on chronic diastolic CHF (congestive heart failure) ([place]) 06/27/2013  . Vitamin D deficiency   . Tobacco abuse 05/12/2012  . Abdominal wall hernia 10/19/2011  . CAD (coronary artery disease)   . Pacemaker-St.Jude   . Chronic anticoagulation 12/03/2010  . Anxiety 12/03/2010  . Mitral stenosis 05/30/2010  . Anemia 05/16/2010  . Type 2 diabetes mellitus with kidney complication, without long-term current use of insulin ([place]) 01/19/2009  . Essential hypertension  01/19/2009  . COPD GOLD O  01/19/2009  . HEART MURMUR, BENIGN 01/19/2009    Past Surgical History:  Procedure Laterality Date  . CHOLECYSTECTOMY     40+ years ago  . COLONOSCOPY  10/2011   Dyann Ruddle sigmoid to descending diverticulosis, internal hemorrhoids  . ESOPHAGOGASTRODUODENOSCOPY  10/2011   Fields-erosive gastritis, biopsy with no H. pylori, hiatal hernia, peptic duodenitis, no celiac disease, duodenal diverticulum, duodenal nodule  . EUS  11/16/11   Mishra-13 MM CBD, normal pancreatic duct, otherwise normal EUS  . Evacuation of retroperitoneal and left rectus sheath    . HEMATOMA EVACUATION     femoral  . HERNIA REPAIR     X3  . PILONIDAL CYST / SINUS EXCISION    . Single-chamber pacemaker implantation  3/12   by Dr Caryl Comes     OB History    Gravida  2   Para  2   Term  2   Preterm      AB      Living        SAB      TAB      Ectopic      Multiple      Live Births               Home Medications    Prior to Admission medications   Medication Sig Start Date End Date Taking? Authorizing Provider  acetaminophen (TYLENOL) 500 MG tablet Take 1,000 mg by mouth every 6 (six) hours as needed for mild pain, moderate pain or fever.     [provider]  Blood Glucose Calibration (TRUE METRIX LEVEL 1) LOW SOLN Use weekly 10/23/14   Hassell Done, Mary-Margaret, FNP  Blood Glucose Monitoring Suppl (TRUE METRIX AIR GLUCOSE METER) DEVI 1 Device by Does not apply route 2 (two) times daily. Use to Test blood sugar bid. DX E13.10 12/19/14   [name], FNP  budesonide-formoterol (SYMBICORT) 160-4.5 MCG/ACT inhaler Inhale 2 puffs into the lungs 2 (two) times daily. 01/13/19   [name], FNP  calcium-vitamin D 250-100 MG-UNIT tablet Take 2 tablets by mouth daily. 12/05/15   Cherre Robins, PharmD  clonazePAM (KLONOPIN) 0.5 MG tablet TAKE 1 TABLET 3 TIMES DAILY AS NEEDED FOR ANXIETY 01/13/19   Hassell Done, Mary-Margaret, FNP  diltiazem (CARDIZEM) 120 MG  tablet Take 1 tablet (120 mg total) by mouth daily. 01/13/19   [name], FNP  ferrous sulfate 325 (65 FE) MG tablet Take 1 tablet (325 mg total) by mouth daily with breakfast. 10/29/15   Cherre Robins, PharmD  glucose blood (TRUE METRIX BLOOD GLUCOSE TEST) test strip Test 2 times daily Dx E13.10 03/06/19   [name], FNP  HYDROcodone-acetaminophen (NORCO) 7.5-325 MG tablet Take 1 tablet by mouth 3 (three) times daily as needed (pain).  02/17/16   [provider]  ipratropium-albuterol (DUONEB) 0.5-2.5 (3) MG/3ML SOLN INHALE THE CONTENTS OF 1 VIAL VIA NEBULIZER EVERY 4 HOURS AS NEEDED 03/29/19  Martin, Mary-Margaret, FNP  JANUVIA 50 MG tablet TAKE 1 TABLET (50 MG TOTAL) BY MOUTH DAILY. 01/16/19   [name], FNP  lipase/protease/amylase (CREON) 36000 UNITS CPEP capsule TAKE 3 CAPSULES WITH MEALS  AND TAKE 1 CAPSULE WITH A SNACK AS DIRECTED 11/03/18   [name], FNP  Multiple Vitamins-Minerals (PRESERVISION AREDS 2) CAPS Take 1 capsule by mouth daily.     [provider]  nitroGLYCERIN (NITROSTAT) 0.4 MG SL tablet DISSOLVE 1 TABLET UNDER THE TONGUE EVERY 5 MINUTES AS NEEDED FOR CHEST PAIN AS DIRECTED 01/09/19   Herminio Commons, MD  omeprazole (PRILOSEC) 40 MG capsule Take 1 capsule (40 mg total) by mouth daily. 01/13/19   Hassell Done, Mary-Margaret, FNP  OXYGEN Inhale 2 L into the lungs at bedtime. 2lpm with sleep only     [provider]  pantoprazole (PROTONIX) 40 MG tablet Take 1 tablet (40 mg total) by mouth 2 (two) times daily. 01/13/19   Hassell Done, Mary-Margaret, FNP  polyethylene glycol powder (GLYCOLAX/MIRALAX) powder Take 17 g by mouth once.    [provider]  potassium chloride SA (K-DUR) 20 MEQ tablet Take 1 tablet (20 mEq total) by mouth every morning. 01/13/19   Hassell Done, Mary-Margaret, FNP  predniSONE (DELTASONE) 10 MG tablet Take 5 daily for 2 days followed by 4,3,2 and 1 for 2 days each. 04/19/19   Claretta Fraise, MD   Probiotic Product ([place]) CAPS Take 1 capsule by mouth daily.    [provider]  simvastatin (ZOCOR) 10 MG tablet TAKE 1 TABLET EVERY DAY 04/03/19   [name], FNP  TRUEPLUS LANCETS 28G MISC TEST BLOOD SUGAR TWICE DAILY 03/03/17   [name], FNP  VENTOLIN HFA 108 [phone] Base) MCG/ACT inhaler USE 2 PUFFS EVERY 6 HOURS AS NEEDED 01/18/17   [name], FNP  warfarin (COUMADIN) 2 MG tablet TAKE 1/2  TABLET (1 MG TOTAL) DAILY AT 6 PM 04/03/19   [name], FNP    Family History Family History  Problem Relation Age of Onset  . Cancer Mother        cervical  . Stroke Mother   . Cancer Daughter 22       kidney, lung  . Heart disease Father   . Heart attack Father   . Ulcers Father   . Early death Sister 1       died at 31 months old  . Hypertension Brother   . Cancer Paternal Aunt        breast  . Hypertension Son   . Colon cancer Neg Hx     Social History Social History   Tobacco Use  . Smoking status: Current Every Day Smoker    Packs/day: 0.25    Years: 45.00    Pack years: 11.25    Types: Cigarettes    Start date: 02/05/1958  . Smokeless tobacco: Never Used  . Tobacco comment: smokes 5-6 cigarettes daily  Substance Use Topics  . Alcohol use: No    Alcohol/week: 0.0 standard drinks  . Drug use: No     Allergies   Macrobid [nitrofurantoin monohyd macro], Torsemide, and Tramadol   Review of Systems Review of Systems  Constitutional: Positive for activity change and appetite change. Negative for fever.  HENT: Positive for congestion and rhinorrhea.   Eyes: Negative for visual disturbance.  Respiratory: Positive for cough, chest tightness and shortness of breath.   Cardiovascular: Negative for chest pain.  Gastrointestinal: Negative for abdominal pain, nausea and vomiting.  Genitourinary: Negative  for dysuria and hematuria.  Musculoskeletal: Positive for arthralgias and myalgias.  Skin: Negative for  rash.  Neurological: Positive for weakness. Negative for dizziness and headaches.   all other systems are negative except as noted in the HPI and PMH.     Physical Exam Updated Vital Signs BP (!) 141/54   Pulse 65   Temp 98.1 F (36.7 C)   Resp (!) 22   Ht 5\' 2"  (1.575 m)   Wt 47.6 kg   SpO2 98%   BMI 19.20 kg/m   Physical Exam Vitals signs and nursing note reviewed.  Constitutional:      General: She is not in acute distress.    Appearance: Normal appearance. She is well-developed and normal weight.     Comments: Speaking in full sentences  HENT:     Head: Normocephalic and atraumatic.     Nose: No rhinorrhea.     Mouth/Throat:     Mouth: Mucous membranes are moist.     Pharynx: No oropharyngeal exudate.  Eyes:     Conjunctiva/sclera: Conjunctivae normal.     Pupils: Pupils are equal, round, and reactive to light.  Neck:     Musculoskeletal: Normal range of motion and neck supple.     Comments: No meningismus. Cardiovascular:     Rate and Rhythm: Normal rate. Rhythm irregular.     Heart sounds: Normal heart sounds. No murmur.  Pulmonary:     Effort: Pulmonary effort is normal. No respiratory distress.     Breath sounds: Wheezing present.     Comments: Diminished breath sounds with scattered expiratory wheezing bilaterally Abdominal:     Palpations: Abdomen is soft.     Tenderness: There is no abdominal tenderness. There is no guarding or rebound.  Musculoskeletal: Normal range of motion.        General: No tenderness.  Skin:    General: Skin is warm.     Capillary Refill: Capillary refill takes less than 2 seconds.  Neurological:     General: No focal deficit present.     Mental Status: She is alert and oriented to person, place, and time. Mental status is at baseline.     Cranial Nerves: No cranial nerve deficit.     Motor: No abnormal muscle tone.     Coordination: Coordination normal.     Comments: No ataxia on finger to nose bilaterally. No pronator  drift. 5/5 strength throughout. CN 2-12 intact.Equal grip strength. Sensation intact.   Psychiatric:        Behavior: Behavior normal.      ED Treatments / Results  Labs (all labs ordered are listed, but only abnormal results are displayed) Labs Reviewed  CBC WITH DIFFERENTIAL/PLATELET - Abnormal; Notable for the following components:      Result Value   RBC 3.26 (*)    Hemoglobin 10.5 (*)    HCT 34.8 (*)    MCV 106.7 (*)    All other components within normal limits  COMPREHENSIVE METABOLIC PANEL - Abnormal; Notable for the following components:   Potassium 3.0 (*)    Glucose, Bld 106 (*)    Creatinine, Ser 1.25 (*)    Albumin 3.1 (*)    GFR calc non Af Amer 40 (*)    GFR calc Af Amer 46 (*)    All other components within normal limits  BRAIN NATRIURETIC PEPTIDE - Abnormal; Notable for the following components:   B Natriuretic Peptide 450.0 (*)    All other components within normal  limits  PROTIME-INR - Abnormal; Notable for the following components:   Prothrombin Time 61.2 (*)    INR 7.3 (*)    All other components within normal limits  SARS CORONAVIRUS 2 (HOSPITAL ORDER, PERFORMED IN [facility])  EXPECTORATED SPUTUM ASSESSMENT W REFEX TO RESP CULTURE  HEMOGLOBIN S8P  BASIC METABOLIC PANEL  MAGNESIUM  CBC  PROTIME-INR  TROPONIN I (HIGH SENSITIVITY)  TROPONIN I (HIGH SENSITIVITY)    EKG EKG Interpretation  Date/Time:  Wednesday April 26 2019 01:34:24 EDT Ventricular Rate:  80 PR Interval:    QRS Duration: 132 QT Interval:  412 QTC Calculation: 476 R Axis:   -75 Text Interpretation:  Atrial flutter with predominant 4:1 AV block Left bundle branch block No significant change was found Confirmed by Ezequiel Essex [phone]) on 04/26/2019 1:44:50 AM   Radiology Ct Head Wo Contrast  Result Date: 04/26/2019 CLINICAL DATA:  Weakness, cough and shortness of breath for 1 week EXAM: CT HEAD WITHOUT CONTRAST TECHNIQUE: Contiguous axial images were  obtained from the base of the skull through the vertex without intravenous contrast. COMPARISON:  CT 10/19/2017 FINDINGS: Imaging quality is motion degraded. Brain: No evidence of acute infarction, hemorrhage, hydrocephalus, extra-axial collection or mass lesion/mass effect. Symmetric prominence of the ventricles, cisterns and sulci compatible with parenchymal volume loss. Patchy areas of white matter hypoattenuation are most compatible with chronic microvascular angiopathy. Vascular: Atherosclerotic calcification of the carotid siphons and intradural vertebral arteries. No hyperdense vessel. Skull: No calvarial fracture or suspicious osseous lesion. No scalp swelling or hematoma. Sinuses/Orbits: Retention cyst present in the right maxillary sinus. Remaining paranasal sinuses and mastoid air cells are predominantly clear. Included orbital structures are unremarkable. Other: None IMPRESSION: Motion degraded study. No definite acute intracranial abnormality. Chronic microvascular changes and parenchymal volume loss are similar to prior. Electronically Signed   By: [name] M.D.   On: 04/26/2019 01:22   Dg Chest Portable 1 View  Result Date: 04/25/2019 CLINICAL DATA:  Shortness of breath EXAM: PORTABLE CHEST 1 VIEW COMPARISON:  08/11/2018, 08/07/2017 FINDINGS: Left-sided pacing device as before. Stable cardiomediastinal silhouette with aortic atherosclerosis. No focal consolidation or effusion. Chronic appearing bronchitic changes. No pneumothorax. Stable sclerotic focus in the right humeral head. IMPRESSION: No active disease. Chronic interstitial opacities. Mild cardiomegaly Electronically Signed   By: Donavan Foil M.D.   On: 04/25/2019 23:10    Procedures Procedures (including critical care time)  Medications Ordered in ED Medications  albuterol (VENTOLIN HFA) 108 (90 Base) MCG/ACT inhaler 6 puff (6 puffs Inhalation Given 04/25/19 2326)  methylPREDNISolone sodium succinate (SOLU-MEDROL) 125 mg/2 mL  injection 60 mg (60 mg Intravenous Given 04/25/19 2326)     Initial Impression / Assessment and Plan / ED Course  I have reviewed the triage vital signs and the nursing notes.  Pertinent labs & imaging results that were available during my care of the patient were reviewed by me and considered in my medical decision making (see chart for details).       Patient here with 1 week of shortness of breath, chest tightness, cough and generalized weakness.  Her lungs are diminished with expiratory wheezing.  EKG shows paced rhythm with atrial fibrillation Chest x-ray is negative for infiltrate  She is given bronchodilators and steroids CXR is negative.  Coronavirus testing is negative.   CT head obtained given her complaint of headache and elevated INR. This was negative.  nebulizers given after coronavirus testing negative.   Dyspnea increased with exertion and tachypnea.  New O2 requirement present.   Will plan admission for suspected COPD exacerbation.  D/w Dr/ Opyd.  CRITICAL CARE Performed by: Ezequiel Essex Total critical care time: 33 minutes Critical care time was exclusive of separately billable procedures and treating other patients. Critical care was necessary to treat or prevent imminent or life-threatening deterioration. Critical care was time spent personally by me on the following activities: development of treatment plan with patient and/or surrogate as well as nursing, discussions with consultants, evaluation of patient's response to treatment, examination of patient, obtaining history from patient or surrogate, ordering and performing treatments and interventions, ordering and review of laboratory studies, ordering and review of radiographic studies, pulse oximetry and re-evaluation of patient's condition.    [name] was evaluated in Emergency Department on 04/26/2019 for the symptoms described in the history of present illness. She was evaluated in the  context of the global COVID-19 pandemic, which necessitated consideration that the patient might be at risk for infection with the SARS-CoV-2 virus that causes COVID-19. Institutional protocols and algorithms that pertain to the evaluation of patients at risk for COVID-19 are in a state of rapid change based on information released by regulatory bodies including the CDC and federal and state organizations. These policies and algorithms were followed during the patient's care in the ED.   Final Clinical Impressions(s) / ED Diagnoses   Final diagnoses:  COPD exacerbation [facility])    ED Discharge Orders    None       [place] Canty, Annie Main, MD 04/26/19 [phone]

## 2019-04-26 ENCOUNTER — Encounter (HOSPITAL_COMMUNITY): Payer: Self-pay | Admitting: Family Medicine

## 2019-04-26 DIAGNOSIS — I5032 Chronic diastolic (congestive) heart failure: Secondary | ICD-10-CM

## 2019-04-26 DIAGNOSIS — I2510 Atherosclerotic heart disease of native coronary artery without angina pectoris: Secondary | ICD-10-CM | POA: Diagnosis present

## 2019-04-26 DIAGNOSIS — R791 Abnormal coagulation profile: Secondary | ICD-10-CM | POA: Diagnosis present

## 2019-04-26 DIAGNOSIS — F419 Anxiety disorder, unspecified: Secondary | ICD-10-CM | POA: Diagnosis present

## 2019-04-26 DIAGNOSIS — I4821 Permanent atrial fibrillation: Secondary | ICD-10-CM | POA: Diagnosis present

## 2019-04-26 DIAGNOSIS — I340 Nonrheumatic mitral (valve) insufficiency: Secondary | ICD-10-CM | POA: Diagnosis present

## 2019-04-26 DIAGNOSIS — Z681 Body mass index (BMI) 19 or less, adult: Secondary | ICD-10-CM | POA: Diagnosis not present

## 2019-04-26 DIAGNOSIS — I495 Sick sinus syndrome: Secondary | ICD-10-CM | POA: Diagnosis present

## 2019-04-26 DIAGNOSIS — D631 Anemia in chronic kidney disease: Secondary | ICD-10-CM | POA: Diagnosis present

## 2019-04-26 DIAGNOSIS — E876 Hypokalemia: Secondary | ICD-10-CM

## 2019-04-26 DIAGNOSIS — R531 Weakness: Secondary | ICD-10-CM | POA: Diagnosis present

## 2019-04-26 DIAGNOSIS — J441 Chronic obstructive pulmonary disease with (acute) exacerbation: Secondary | ICD-10-CM | POA: Diagnosis present

## 2019-04-26 DIAGNOSIS — I428 Other cardiomyopathies: Secondary | ICD-10-CM | POA: Diagnosis present

## 2019-04-26 DIAGNOSIS — D539 Nutritional anemia, unspecified: Secondary | ICD-10-CM | POA: Diagnosis present

## 2019-04-26 DIAGNOSIS — E1122 Type 2 diabetes mellitus with diabetic chronic kidney disease: Secondary | ICD-10-CM | POA: Diagnosis present

## 2019-04-26 DIAGNOSIS — Z20828 Contact with and (suspected) exposure to other viral communicable diseases: Secondary | ICD-10-CM | POA: Diagnosis present

## 2019-04-26 DIAGNOSIS — N183 Chronic kidney disease, stage 3 (moderate): Secondary | ICD-10-CM

## 2019-04-26 DIAGNOSIS — I10 Essential (primary) hypertension: Secondary | ICD-10-CM | POA: Diagnosis not present

## 2019-04-26 DIAGNOSIS — R64 Cachexia: Secondary | ICD-10-CM | POA: Diagnosis present

## 2019-04-26 DIAGNOSIS — F17200 Nicotine dependence, unspecified, uncomplicated: Secondary | ICD-10-CM | POA: Diagnosis present

## 2019-04-26 DIAGNOSIS — J9621 Acute and chronic respiratory failure with hypoxia: Secondary | ICD-10-CM | POA: Diagnosis present

## 2019-04-26 DIAGNOSIS — I447 Left bundle-branch block, unspecified: Secondary | ICD-10-CM | POA: Diagnosis present

## 2019-04-26 DIAGNOSIS — K219 Gastro-esophageal reflux disease without esophagitis: Secondary | ICD-10-CM | POA: Diagnosis present

## 2019-04-26 DIAGNOSIS — D509 Iron deficiency anemia, unspecified: Secondary | ICD-10-CM | POA: Diagnosis present

## 2019-04-26 DIAGNOSIS — E7800 Pure hypercholesterolemia, unspecified: Secondary | ICD-10-CM | POA: Diagnosis present

## 2019-04-26 DIAGNOSIS — I130 Hypertensive heart and chronic kidney disease with heart failure and stage 1 through stage 4 chronic kidney disease, or unspecified chronic kidney disease: Secondary | ICD-10-CM | POA: Diagnosis present

## 2019-04-26 LAB — CBC
HCT: 37.2 % (ref 36.0–46.0)
Hemoglobin: 11.4 g/dL — ABNORMAL LOW (ref 12.0–15.0)
MCH: 32.7 pg (ref 26.0–34.0)
MCHC: 30.6 g/dL (ref 30.0–36.0)
MCV: 106.6 fL — ABNORMAL HIGH (ref 80.0–100.0)
Platelets: 201 10*3/uL (ref 150–400)
RBC: 3.49 MIL/uL — ABNORMAL LOW (ref 3.87–5.11)
RDW: 14.2 % (ref 11.5–15.5)
WBC: 6.8 10*3/uL (ref 4.0–10.5)
nRBC: 0 % (ref 0.0–0.2)

## 2019-04-26 LAB — SARS CORONAVIRUS 2 BY RT PCR (HOSPITAL ORDER, PERFORMED IN ~~LOC~~ HOSPITAL LAB): SARS Coronavirus 2: NEGATIVE

## 2019-04-26 LAB — GLUCOSE, CAPILLARY
Glucose-Capillary: 204 mg/dL — ABNORMAL HIGH (ref 70–99)
Glucose-Capillary: 213 mg/dL — ABNORMAL HIGH (ref 70–99)
Glucose-Capillary: 192 mg/dL — ABNORMAL HIGH (ref 70–99)
Glucose-Capillary: 183 mg/dL — ABNORMAL HIGH (ref 70–99)

## 2019-04-26 LAB — BASIC METABOLIC PANEL
Anion gap: 10 (ref 5–15)
BUN: 17 mg/dL (ref 8–23)
CO2: 31 mmol/L (ref 22–32)
Calcium: 9.7 mg/dL (ref 8.9–10.3)
Chloride: 103 mmol/L (ref 98–111)
Creatinine, Ser: 1.2 mg/dL — ABNORMAL HIGH (ref 0.44–1.00)
GFR calc Af Amer: 49 mL/min — ABNORMAL LOW (ref >60)
GFR calc non Af Amer: 42 mL/min — ABNORMAL LOW (ref >60)
Glucose, Bld: 202 mg/dL — ABNORMAL HIGH (ref 70–99)
Potassium: 3.3 mmol/L — ABNORMAL LOW (ref 3.5–5.1)
Sodium: 144 mmol/L (ref 135–145)

## 2019-04-26 LAB — PROTIME-INR
INR: 6.4 (ref 0.8–1.2)
Prothrombin Time: 55.2 seconds — ABNORMAL HIGH (ref 11.4–15.2)

## 2019-04-26 LAB — TROPONIN I (HIGH SENSITIVITY): Troponin I (High Sensitivity): 16 ng/L (ref <18)

## 2019-04-26 LAB — MAGNESIUM: Magnesium: 3.1 mg/dL — ABNORMAL HIGH (ref 1.7–2.4)

## 2019-04-26 MED ORDER — SODIUM CHLORIDE 0.9% FLUSH: 3.0000 mL | INTRAVENOUS | Status: DC | PRN

## 2019-04-26 MED ORDER — ONDANSETRON HCL 4 MG PO TABS: 4.0000 mg | ORAL_TABLET | Freq: Four times a day (QID) | ORAL | Status: DC | PRN

## 2019-04-26 MED ORDER — INSULIN ASPART 100 UNIT/ML ~~LOC~~ SOLN
0.0000 [IU] | Freq: Three times a day (TID) | SUBCUTANEOUS | Status: DC
  Administered 2019-04-26 (×2): 3 [IU] via SUBCUTANEOUS
  Administered 2019-04-26: 2 [IU] via SUBCUTANEOUS
  Administered 2019-04-27: 1 [IU] via SUBCUTANEOUS
  Administered 2019-04-27: 13:00:00 5 [IU] via SUBCUTANEOUS

## 2019-04-26 MED ORDER — ACETAMINOPHEN 325 MG PO TABS: 650.0000 mg | ORAL_TABLET | Freq: Four times a day (QID) | ORAL | Status: DC | PRN

## 2019-04-26 MED ORDER — IPRATROPIUM-ALBUTEROL 0.5-2.5 (3) MG/3ML IN SOLN
3.0000 mL | Freq: Four times a day (QID) | RESPIRATORY_TRACT | Status: DC
  Administered 2019-04-26 (×2): 3 mL via RESPIRATORY_TRACT
  Filled 2019-04-26 (×2): qty 3

## 2019-04-26 MED ORDER — ONDANSETRON HCL 4 MG/2ML IJ SOLN: 4.0000 mg | Freq: Four times a day (QID) | INTRAMUSCULAR | Status: DC | PRN

## 2019-04-26 MED ORDER — ALBUTEROL (5 MG/ML) CONTINUOUS INHALATION SOLN
10.0000 mg/h | INHALATION_SOLUTION | Freq: Once | RESPIRATORY_TRACT | Status: AC
  Administered 2019-04-26: 10 mg/h via RESPIRATORY_TRACT
  Filled 2019-04-26: qty 20

## 2019-04-26 MED ORDER — IPRATROPIUM-ALBUTEROL 0.5-2.5 (3) MG/3ML IN SOLN
3.0000 mL | Freq: Three times a day (TID) | RESPIRATORY_TRACT | Status: DC
  Administered 2019-04-27: 08:00:00 3 mL via RESPIRATORY_TRACT
  Filled 2019-04-26 (×2): qty 3

## 2019-04-26 MED ORDER — MAGNESIUM SULFATE 2 GM/50ML IV SOLN
2.0000 g | Freq: Once | INTRAVENOUS | Status: AC
  Administered 2019-04-26: 02:00:00 2 g via INTRAVENOUS
  Filled 2019-04-26: qty 50

## 2019-04-26 MED ORDER — POTASSIUM CHLORIDE CRYS ER 20 MEQ PO TBCR
40.0000 meq | EXTENDED_RELEASE_TABLET | Freq: Once | ORAL | Status: AC
  Administered 2019-04-26: 40 meq via ORAL
  Filled 2019-04-26: qty 2

## 2019-04-26 MED ORDER — SODIUM CHLORIDE 0.9 % IV SOLN
500.0000 mg | INTRAVENOUS | Status: DC
  Administered 2019-04-26 – 2019-04-27 (×2): 500 mg via INTRAVENOUS
  Filled 2019-04-26 (×2): qty 500

## 2019-04-26 MED ORDER — INSULIN ASPART 100 UNIT/ML ~~LOC~~ SOLN: 0.0000 [IU] | Freq: Every day | SUBCUTANEOUS | Status: DC

## 2019-04-26 MED ORDER — IPRATROPIUM BROMIDE 0.02 % IN SOLN
0.5000 mg | Freq: Once | RESPIRATORY_TRACT | Status: AC
  Administered 2019-04-26: 02:00:00 0.5 mg via RESPIRATORY_TRACT
  Filled 2019-04-26: qty 2.5

## 2019-04-26 MED ORDER — PANCRELIPASE (LIP-PROT-AMYL) 12000-38000 UNITS PO CPEP: 12000.0000 [IU] | ORAL_CAPSULE | ORAL | Status: DC

## 2019-04-26 MED ORDER — CLONAZEPAM 0.5 MG PO TABS
0.5000 mg | ORAL_TABLET | Freq: Three times a day (TID) | ORAL | Status: DC | PRN
  Administered 2019-04-26 – 2019-04-27 (×2): 0.5 mg via ORAL
  Filled 2019-04-26 (×2): qty 1

## 2019-04-26 MED ORDER — METHYLPREDNISOLONE SODIUM SUCC 125 MG IJ SOLR
60.0000 mg | Freq: Four times a day (QID) | INTRAMUSCULAR | Status: DC
  Administered 2019-04-26 – 2019-04-27 (×6): 60 mg via INTRAVENOUS
  Filled 2019-04-26 (×6): qty 2

## 2019-04-26 MED ORDER — DILTIAZEM HCL ER 120 MG PO CP24
120.0000 mg | ORAL_CAPSULE | Freq: Every day | ORAL | Status: DC
  Administered 2019-04-26 – 2019-04-27 (×2): 120 mg via ORAL
  Filled 2019-04-26 (×5): qty 1

## 2019-04-26 MED ORDER — HYDROCODONE-ACETAMINOPHEN 7.5-325 MG PO TABS
1.0000 | ORAL_TABLET | Freq: Three times a day (TID) | ORAL | Status: DC | PRN
  Administered 2019-04-26 – 2019-04-27 (×4): 1 via ORAL
  Filled 2019-04-26 (×4): qty 1

## 2019-04-26 MED ORDER — MOMETASONE FURO-FORMOTEROL FUM 200-5 MCG/ACT IN AERO
2.0000 | INHALATION_SPRAY | Freq: Two times a day (BID) | RESPIRATORY_TRACT | Status: DC
  Administered 2019-04-26 – 2019-04-27 (×3): 2 via RESPIRATORY_TRACT
  Filled 2019-04-26: qty 8.8

## 2019-04-26 MED ORDER — POTASSIUM CHLORIDE CRYS ER 20 MEQ PO TBCR
20.0000 meq | EXTENDED_RELEASE_TABLET | Freq: Every morning | ORAL | Status: DC
  Administered 2019-04-26 – 2019-04-27 (×2): 20 meq via ORAL
  Filled 2019-04-26 (×2): qty 1

## 2019-04-26 MED ORDER — ALUM & MAG HYDROXIDE-SIMETH 200-200-20 MG/5ML PO SUSP
30.0000 mL | Freq: Four times a day (QID) | ORAL | Status: DC | PRN
  Administered 2019-04-26 – 2019-04-27 (×2): 30 mL via ORAL
  Filled 2019-04-26 (×2): qty 30

## 2019-04-26 MED ORDER — PANCRELIPASE (LIP-PROT-AMYL) 12000-38000 UNITS PO CPEP
36000.0000 [IU] | ORAL_CAPSULE | Freq: Three times a day (TID) | ORAL | Status: DC
  Administered 2019-04-26 – 2019-04-27 (×5): 36000 [IU] via ORAL
  Filled 2019-04-26 (×5): qty 3

## 2019-04-26 MED ORDER — SIMVASTATIN 10 MG PO TABS
10.0000 mg | ORAL_TABLET | Freq: Every day | ORAL | Status: DC
  Administered 2019-04-26: 17:00:00 10 mg via ORAL
  Filled 2019-04-26: qty 1

## 2019-04-26 MED ORDER — SODIUM CHLORIDE 0.9 % IV SOLN: 250.0000 mL | INTRAVENOUS | Status: DC | PRN

## 2019-04-26 MED ORDER — PANTOPRAZOLE SODIUM 40 MG PO TBEC
40.0000 mg | DELAYED_RELEASE_TABLET | Freq: Every day | ORAL | Status: DC
  Administered 2019-04-26 – 2019-04-27 (×2): 40 mg via ORAL
  Filled 2019-04-26 (×2): qty 1

## 2019-04-26 MED ORDER — POLYETHYLENE GLYCOL 3350 17 G PO PACK: 17.0000 g | PACK | Freq: Every day | ORAL | Status: DC | PRN

## 2019-04-26 MED ORDER — ACETAMINOPHEN 650 MG RE SUPP: 650.0000 mg | Freq: Four times a day (QID) | RECTAL | Status: DC | PRN

## 2019-04-26 MED ORDER — IPRATROPIUM-ALBUTEROL 0.5-2.5 (3) MG/3ML IN SOLN: 3.0000 mL | RESPIRATORY_TRACT | Status: DC | PRN

## 2019-04-26 NOTE — Progress Notes (Signed)
CRITICAL VALUE ALERT  Critical Value: INR 7.3   Date & Time Notied: 9-16-20200430  Provider Notified: opyd, MD  Orders Received/Actions taken: No new orders received at this time.  Will continue to monitor patient.

## 2019-04-26 NOTE — TOC Initial Note (Signed)
Transition of Care [facility]) - Initial/Assessment Note    Patient Details  Name: [name] MRN: [id number] Date of Birth: 26-Jun-1935  Transition of Care [facility]) CM/SW Contact:    Heliodoro Domagalski, Chauncey [place], RN Phone Number: 04/26/2019, 10:37 AM  Clinical Narrative:   COPD exacerbation. From home with granddaughter. Independent. Has cane and RW if needed. Has night time oxygen (she thinks [employer]). No current home health. Has PCP, transportation to appointments, no issues obtaining/taking medications. TOC to follow for needs. Discussed home health with patient, she is unsure if she will agree. Anticipate a PT eval prior to DC.                 Expected Discharge Plan: [place] Barriers to Discharge: No Barriers Identified   Patient Goals and CMS Choice Patient states their goals for this hospitalization and ongoing recovery are:: return home and breathe easier/feel better CMS Medicare.gov Compare Post Acute Care list provided to:: Patient    Expected Discharge Plan and Services Expected Discharge Plan: [place]   Discharge Planning Services: CM Consult   Living arrangements for the past 2 months: Single Family Home                         Prior Living Arrangements/Services Living arrangements for the past 2 months: Single Family Home Lives with:: Relatives(grand daughter) Patient language and need for interpreter reviewed:: Yes Do you feel safe going back to the place where you live?: Yes      Need for Family Participation in Patient Care: Yes (Comment) Care giver support system in place?: Yes (comment) Current home services: DME(cane, RW) Criminal Activity/Legal Involvement Pertinent to Current Situation/Hospitalization: No - Comment as needed  Activities of Daily Living Home Assistive Devices/Equipment: Cane (specify quad or straight), Walker (specify type) ADL Screening (condition at time of admission) Patient's cognitive ability  adequate to safely complete daily activities?: Yes Is the patient deaf or have difficulty hearing?: Yes Does the patient have difficulty seeing, even when wearing glasses/contacts?: No Does the patient have difficulty concentrating, remembering, or making decisions?: No Patient able to express need for assistance with ADLs?: Yes Does the patient have difficulty dressing or bathing?: No Independently performs ADLs?: Yes (appropriate for developmental age) Does the patient have difficulty walking or climbing stairs?: No Weakness of Legs: None Weakness of Arms/Hands: None        Emotional Assessment   Attitude/Demeanor/Rapport: Engaged Affect (typically observed): Accepting Orientation: : Oriented to Self, Oriented to Place, Oriented to  Time Alcohol / Substance Use: Not Applicable Psych Involvement: No (comment)  Admission diagnosis:  COPD exacerbation ([place]) [J44.1] Patient Active Problem List   Diagnosis Date Noted  . COPD exacerbation ([place]) 04/26/2019  . Hypokalemia 04/26/2019  . Supratherapeutic INR 04/26/2019  . At high risk for falls 02/13/2016  . CKD (chronic kidney disease), stage III ([place]) 07/02/2015  . Hyperlipidemia with target LDL less than 100 12/31/2014  . Osteoporosis, post-menopausal 11/01/2013  . GERD (gastroesophageal reflux disease) 07/13/2013  . Aortic stenosis 06/27/2013  . Chronic diastolic CHF (congestive heart failure) ([place]) 06/27/2013  . Vitamin D deficiency   . Tobacco abuse 05/12/2012  . Abdominal wall hernia 10/19/2011  . Unspecified atrial fibrillation ([place])   . CAD (coronary artery disease)   . Pacemaker-St.Jude   . Chronic anticoagulation 12/03/2010  . Anxiety 12/03/2010  . Mitral stenosis 05/30/2010  . Macrocytic anemia 05/16/2010  . Type 2 diabetes mellitus with  kidney complication, without long-term current use of insulin ([place]) 01/19/2009  . Essential hypertension 01/19/2009  . COPD GOLD O  01/19/2009  . HEART MURMUR, BENIGN 01/19/2009    PCP:  [name], FNP Pharmacy:   [facility], [address] [place] [address] Phone: [phone] Fax: [phone]  [facility] [place], [address] [place] [address] Phone: [phone] Fax: [place], [place] [address] Phone: [phone] Fax: [phone]     Social Determinants of Health ([place]) Interventions    Readmission Risk Interventions Readmission Risk Prevention Plan 04/26/2019  Transportation Screening Complete  Social Work Consult for [place] Planning/Counseling [place] Not Applicable  Medication Review Press photographer) Complete  Some recent data might be hidden

## 2019-04-26 NOTE — H&P (Signed)
History and Physical    [name] DXI:[id number] DOB: 03-17-1935 DOA: 04/25/2019  PCP: [name], FNP   Patient coming from: Home   Chief Complaint: Cough, SOB, gen weakness, HA   HPI: [name] is a 84 y.o. female with medical history significant for COPD, coronary artery disease, chronic diastolic CHF, atrial fibrillation on warfarin, chronic kidney disease stage III, chronic anemia, and tachy-bradycardia syndrome with pacer, now presenting to the emergency department for evaluation of increased cough, shortness of breath, headaches, and general weakness and malaise.  Patient reports waking with mild headaches recently, experiencing some general malaise and generalized weakness without change in vision or hearing or focal numbness or weakness, and has also been experiencing increase in her chronic cough and shortness of breath, all for the past week.  She denies fevers or chills, denies chest pain, and denies headache at this time.  She has inhalers at home and nebulizer machine, but has not tried those.  She reports that her legs will swell occasionally, but not now.  ED Course: Upon arrival to the ED, patient is found to be afebrile, saturating 84% on room air, tachypneic, and with stable blood pressure.  EKG features atrial fibrillation with chronic LBBB.  Noncontrast head CT is negative for acute findings.  Chest x-ray is negative for acute cardiopulmonary disease.  Chemistry panel notable for potassium 3.0 and creatinine 1.25.  CBC features a microcytic anemia with hemoglobin 10.5.  INR is elevated to 7.3.  High-sensitivity troponin was normal x2.  BNP is elevated 11/16/1946, similar to priors.  Patient was treated with supplemental oxygen, IV magnesium, IV Solu-Medrol, oral potassium, and DuoNeb in the ED.  Review of Systems:  All other systems reviewed and apart from HPI, are negative.  Past Medical History:  Diagnosis Date   Abnormal CT of the chest    Mediastinal and hilar adenopathy followed by Dr. Koleen Nimrod in [place]   Anemia, unspecified    Body mass index (BMI) of 20.0-20.9 in adult FEB 2013 147 LBS   CAD (coronary artery disease) 2011   Catheterization August 2011: mild nonobstructive coronary artery disease complicated by large left rectus sheath hematoma status post evacuation Coumadin restarted without recurrence   Carotid artery disease ([place])    Doppler, 05/2012, 0-39% bilateral   Cataract    Chronic airway obstruction, not elsewhere classified    Chronic kidney disease, stage II (mild)    Congestive heart failure, unspecified    EF now 60%, previous nonischemic cardiomyopathy   Diabetes mellitus    Dilated bile duct 10/19/2011   Diverticulitis Dec 2012   Gastritis    By EGD   GERD (gastroesophageal reflux disease)    Hypercholesterolemia    Hypertension    Mitral regurgitation 06/27/2013   Neurogenic sleep apnea    Uses noctural oxygen prescribed by her pulmonologi   Pacemaker    Single chamber Welcome[employer] Jude ACCENT [id number] SN 719 04/11/1959 10/31/2010   Permanent atrial fibrillation    H/o tachybradycardia syndrome on Coumadin anticoagulation, has pacemaker.   Recurrent UTI    Rheumatic heart disease    Tachycardia-bradycardia ([place])    s/p St. Jude single chamber PPM 10/2010    Unspecified hypertensive kidney disease with chronic kidney disease stage I through stage IV, or unspecified(403.90)    Valvular heart disease    a. H/o moderate mitral stenosis by cath 2011 but no mention of this on 10/2011 echo. b. 10/2011 echo: mod MR, mild AS, mild TR; EF>65%   Vitamin D  deficiency     Past Surgical History:  Procedure Laterality Date   CHOLECYSTECTOMY     40+ years ago   COLONOSCOPY  10/2011   Dyann Ruddle sigmoid to descending diverticulosis, internal hemorrhoids   ESOPHAGOGASTRODUODENOSCOPY  10/2011   Fields-erosive gastritis, biopsy with no H. pylori, hiatal hernia, peptic duodenitis, no celiac  disease, duodenal diverticulum, duodenal nodule   EUS  11/16/11   Mishra-13 MM CBD, normal pancreatic duct, otherwise normal EUS   Evacuation of retroperitoneal and left rectus sheath     HEMATOMA EVACUATION     femoral   HERNIA REPAIR     X3   PILONIDAL CYST / SINUS EXCISION     Single-chamber pacemaker implantation  3/12   by Dr Caryl Comes     reports that she has been smoking cigarettes. She started smoking about 61 years ago. She has a 11.25 pack-year smoking history. She has never used smokeless tobacco. She reports that she does not drink alcohol or use drugs.  Allergies  Allergen Reactions   Macrobid [Nitrofurantoin Monohyd Macro] Rash   Torsemide Nausea And Vomiting   Tramadol Nausea Only    Family History  Problem Relation Age of Onset   Cancer Mother        cervical   Stroke Mother    Cancer Daughter 65       kidney, lung   Heart disease Father    Heart attack Father    Ulcers Father    Early death Sister 1       died at 61 months old   Hypertension Brother    Cancer Paternal Aunt        breast   Hypertension Son    Colon cancer Neg Hx      Prior to Admission medications   Medication Sig Start Date End Date Taking? Authorizing Provider  acetaminophen (TYLENOL) 500 MG tablet Take 1,000 mg by mouth every 6 (six) hours as needed for mild pain, moderate pain or fever.     [provider]  Blood Glucose Calibration (TRUE METRIX LEVEL 1) LOW SOLN Use weekly 10/23/14   Hassell Done, Mary-Margaret, FNP  Blood Glucose Monitoring Suppl (TRUE METRIX AIR GLUCOSE METER) DEVI 1 Device by Does not apply route 2 (two) times daily. Use to Test blood sugar bid. DX E13.10 12/19/14   [name], FNP  budesonide-formoterol (SYMBICORT) 160-4.5 MCG/ACT inhaler Inhale 2 puffs into the lungs 2 (two) times daily. 01/13/19   [name], FNP  calcium-vitamin D 250-100 MG-UNIT tablet Take 2 tablets by mouth daily. 12/05/15   Cherre Robins, PharmD    clonazePAM (KLONOPIN) 0.5 MG tablet TAKE 1 TABLET 3 TIMES DAILY AS NEEDED FOR ANXIETY 01/13/19   Hassell Done, Mary-Margaret, FNP  diltiazem (CARDIZEM) 120 MG tablet Take 1 tablet (120 mg total) by mouth daily. 01/13/19   [name], FNP  ferrous sulfate 325 (65 FE) MG tablet Take 1 tablet (325 mg total) by mouth daily with breakfast. 10/29/15   Cherre Robins, PharmD  glucose blood (TRUE METRIX BLOOD GLUCOSE TEST) test strip Test 2 times daily Dx E13.10 03/06/19   [name], FNP  HYDROcodone-acetaminophen (NORCO) 7.5-325 MG tablet Take 1 tablet by mouth 3 (three) times daily as needed (pain).  02/17/16   [provider]  ipratropium-albuterol (DUONEB) 0.5-2.5 (3) MG/3ML SOLN INHALE THE CONTENTS OF 1 VIAL VIA NEBULIZER EVERY 4 HOURS AS NEEDED 03/29/19   Hassell Done, Mary-Margaret, FNP  JANUVIA 50 MG tablet TAKE 1 TABLET (50 MG TOTAL) BY MOUTH DAILY. 01/16/19  Hassell Done, Mary-Margaret, FNP  lipase/protease/amylase (CREON) 36000 UNITS CPEP capsule TAKE 3 CAPSULES WITH MEALS  AND TAKE 1 CAPSULE WITH A SNACK AS DIRECTED 11/03/18   [name], FNP  Multiple Vitamins-Minerals (PRESERVISION AREDS 2) CAPS Take 1 capsule by mouth daily.     [provider]  nitroGLYCERIN (NITROSTAT) 0.4 MG SL tablet DISSOLVE 1 TABLET UNDER THE TONGUE EVERY 5 MINUTES AS NEEDED FOR CHEST PAIN AS DIRECTED 01/09/19   Herminio Commons, MD  omeprazole (PRILOSEC) 40 MG capsule Take 1 capsule (40 mg total) by mouth daily. 01/13/19   Hassell Done, Mary-Margaret, FNP  OXYGEN Inhale 2 L into the lungs at bedtime. 2lpm with sleep only     [provider]  pantoprazole (PROTONIX) 40 MG tablet Take 1 tablet (40 mg total) by mouth 2 (two) times daily. 01/13/19   Hassell Done, Mary-Margaret, FNP  polyethylene glycol powder (GLYCOLAX/MIRALAX) powder Take 17 g by mouth once.    [provider]  potassium chloride SA (K-DUR) 20 MEQ tablet Take 1 tablet (20 mEq total) by mouth every morning. 01/13/19   Hassell Done,  Mary-Margaret, FNP  predniSONE (DELTASONE) 10 MG tablet Take 5 daily for 2 days followed by 4,3,2 and 1 for 2 days each. 04/19/19   Claretta Fraise, MD  Probiotic Product ([place]) CAPS Take 1 capsule by mouth daily.    [provider]  simvastatin (ZOCOR) 10 MG tablet TAKE 1 TABLET EVERY DAY 04/03/19   [name], FNP  TRUEPLUS LANCETS 28G MISC TEST BLOOD SUGAR TWICE DAILY 03/03/17   [name], FNP  VENTOLIN HFA 108 (90 Base) MCG/ACT inhaler USE 2 PUFFS EVERY 6 HOURS AS NEEDED 01/18/17   [name], FNP  warfarin (COUMADIN) 2 MG tablet TAKE 1/2  TABLET (1 MG TOTAL) DAILY AT 6 PM 04/03/19   [name], FNP    Physical Exam: Vitals:   04/26/19 0030 04/26/19 0153 04/26/19 0302 04/26/19 0337  BP: (!) 128/54  (!) 117/50 (!) 164/65  Pulse: 65  82 95  Resp: (!) 25  18 (!) 24  Temp:    97.8 F (36.6 C)  TempSrc:    Oral  SpO2: 96% 95% 96% 98%  Weight:    45.8 kg  Height:    5\' 2"  (1.575 m)    Constitutional: NAD, calm, frail appearing  Eyes: PERTLA, lids and conjunctivae normal ENMT: Mucous membranes are moist. Posterior pharynx clear of any exudate or lesions.   Neck: normal, supple, no masses, no thyromegaly Respiratory: Breath sounds diminished bilaterally, prolonged expiratory phase. No pallor or cyanosis.  Cardiovascular: Rate ~60 and irregular. Trace pretibial edema bilaterally.  Abdomen: No distension, no tenderness, soft. Bowel sounds active.  Musculoskeletal: no clubbing / cyanosis. No joint deformity upper and lower extremities.    Skin: no significant rashes, lesions, ulcers. Warm, dry, well-perfused. Neurologic: CN 2-12 grossly intact. Sensation intact. Strength 5/5 in all 4 limbs.  Psychiatric: Alert and oriented to person, place, and situation. Calm, cooperative.    Labs on Admission: I have personally reviewed following labs and imaging studies  CBC: Recent Labs  Lab 04/25/19 2223  WBC 8.2  NEUTROABS 5.6    HGB 10.5*  HCT 34.8*  MCV 106.7*  PLT 672   Basic Metabolic Panel: Recent Labs  Lab 04/25/19 2223  NA 143  K 3.0*  CL 102  CO2 32  GLUCOSE 106*  BUN 18  CREATININE 1.25*  CALCIUM 9.4   GFR: Estimated Creatinine Clearance: 25.1 mL/min (A) (by C-G formula based  on SCr of 1.25 mg/dL (H)). Liver Function Tests: Recent Labs  Lab 04/25/19 2223  AST 31  ALT 19  ALKPHOS 80  BILITOT 0.5  PROT 6.9  ALBUMIN 3.1*   No results for input(s): LIPASE, AMYLASE in the last 168 hours. No results for input(s): AMMONIA in the last 168 hours. Coagulation Profile: Recent Labs  Lab 04/25/19 2223  INR 7.3*   Cardiac Enzymes: No results for input(s): CKTOTAL, CKMB, CKMBINDEX, TROPONINI in the last 168 hours. BNP (last 3 results) No results for input(s): PROBNP in the last 8760 hours. HbA1C: No results for input(s): HGBA1C in the last 72 hours. CBG: No results for input(s): GLUCAP in the last 168 hours. Lipid Profile: No results for input(s): CHOL, HDL, LDLCALC, TRIG, CHOLHDL, LDLDIRECT in the last 72 hours. Thyroid Function Tests: No results for input(s): TSH, T4TOTAL, FREET4, T3FREE, THYROIDAB in the last 72 hours. Anemia Panel: No results for input(s): VITAMINB12, FOLATE, FERRITIN, TIBC, IRON, RETICCTPCT in the last 72 hours. Urine analysis:    Component Value Date/Time   COLORURINE YELLOW 11/25/2015 2255   APPEARANCEUR Cloudy (A) 08/25/2017 1732   LABSPEC 1.010 11/25/2015 2255   PHURINE 7.0 11/25/2015 2255   GLUCOSEU Negative 08/25/2017 1732   HGBUR NEGATIVE 11/25/2015 2255   BILIRUBINUR Negative 08/25/2017 1732   KETONESUR NEGATIVE 11/25/2015 2255   PROTEINUR 2+ (A) 08/25/2017 1732   PROTEINUR NEGATIVE 11/25/2015 2255   UROBILINOGEN negative 02/28/2015 1602   UROBILINOGEN 0.2 08/21/2013 1922   NITRITE Negative 08/25/2017 1732   NITRITE NEGATIVE 11/25/2015 2255   LEUKOCYTESUR 1+ (A) 08/25/2017 1732   Sepsis Labs: @LABRCNTIP (procalcitonin:4,lacticidven:4) ) Recent  Results (from the past 240 hour(s))  SARS Coronavirus 2 [facility] order, Performed in [facility] hospital lab) Nasopharyngeal Nasopharyngeal Swab     Status: None   Collection Time: 04/25/19 10:15 PM   Specimen: Nasopharyngeal Swab  Result Value Ref Range Status   SARS Coronavirus 2 NEGATIVE NEGATIVE Final    Comment: (NOTE) If result is NEGATIVE SARS-CoV-2 target nucleic acids are NOT DETECTED. The SARS-CoV-2 RNA is generally detectable in upper and lower  respiratory specimens during the acute phase of infection. The lowest  concentration of SARS-CoV-2 viral copies this assay can detect is 250  copies / mL. A negative result does not preclude SARS-CoV-2 infection  and should not be used as the sole basis for treatment or other  patient management decisions.  A negative result may occur with  improper specimen collection / handling, submission of specimen other  than nasopharyngeal swab, presence of viral mutation(s) within the  areas targeted by this assay, and inadequate number of viral copies  (<250 copies / mL). A negative result must be combined with clinical  observations, patient history, and epidemiological information. If result is POSITIVE SARS-CoV-2 target nucleic acids are DETECTED. The SARS-CoV-2 RNA is generally detectable in upper and lower  respiratory specimens dur ing the acute phase of infection.  Positive  results are indicative of active infection with SARS-CoV-2.  Clinical  correlation with patient history and other diagnostic information is  necessary to determine patient infection status.  Positive results do  not rule out bacterial infection or co-infection with other viruses. If result is PRESUMPTIVE POSTIVE SARS-CoV-2 nucleic acids MAY BE PRESENT.   A presumptive positive result was obtained on the submitted specimen  and confirmed on repeat testing.  While 2019 novel coronavirus  (SARS-CoV-2) nucleic acids may be present in the submitted sample    additional confirmatory testing may be necessary  for epidemiological  and / or clinical management purposes  to differentiate between  SARS-CoV-2 and other Sarbecovirus currently known to infect humans.  If clinically indicated additional testing with an alternate test  methodology [phone]) is advised. The SARS-CoV-2 RNA is generally  detectable in upper and lower respiratory sp ecimens during the acute  phase of infection. The expected result is Negative. Fact Sheet for Patients:  StrictlyIdeas.no Fact Sheet for Healthcare Providers: BankingDealers.co.za This test is not yet approved or cleared by the [place] FDA and has been authorized for detection and/or diagnosis of SARS-CoV-2 by FDA under an Emergency Use Authorization (EUA).  This EUA will remain in effect (meaning this test can be used) for the duration of the COVID-19 declaration under Section 564(b)(1) of the Act, 21 U.S.C. section 360bbb-3(b)(1), unless the authorization is terminated or revoked sooner. Performed at [facility], [address] [place], [place] [address]      Radiological Exams on Admission: Ct Head Wo Contrast  Result Date: 04/26/2019 CLINICAL DATA:  Weakness, cough and shortness of breath for 1 week EXAM: CT HEAD WITHOUT CONTRAST TECHNIQUE: Contiguous axial images were obtained from the base of the skull through the vertex without intravenous contrast. COMPARISON:  CT 10/19/2017 FINDINGS: Imaging quality is motion degraded. Brain: No evidence of acute infarction, hemorrhage, hydrocephalus, extra-axial collection or mass lesion/mass effect. Symmetric prominence of the ventricles, cisterns and sulci compatible with parenchymal volume loss. Patchy areas of white matter hypoattenuation are most compatible with chronic microvascular angiopathy. Vascular: Atherosclerotic calcification of the carotid siphons and intradural vertebral arteries. No hyperdense vessel.  Skull: No calvarial fracture or suspicious osseous lesion. No scalp swelling or hematoma. Sinuses/Orbits: Retention cyst present in the right maxillary sinus. Remaining paranasal sinuses and mastoid air cells are predominantly clear. Included orbital structures are unremarkable. Other: None IMPRESSION: Motion degraded study. No definite acute intracranial abnormality. Chronic microvascular changes and parenchymal volume loss are similar to prior. Electronically Signed   By: [name] M.D.   On: 04/26/2019 01:22   Dg Chest Portable 1 View  Result Date: 04/25/2019 CLINICAL DATA:  Shortness of breath EXAM: PORTABLE CHEST 1 VIEW COMPARISON:  08/11/2018, 08/07/2017 FINDINGS: Left-sided pacing device as before. Stable cardiomediastinal silhouette with aortic atherosclerosis. No focal consolidation or effusion. Chronic appearing bronchitic changes. No pneumothorax. Stable sclerotic focus in the right humeral head. IMPRESSION: No active disease. Chronic interstitial opacities. Mild cardiomegaly Electronically Signed   By: Donavan Foil M.D.   On: 04/25/2019 23:10    EKG: Independently reviewed. Atrial fibrillation, chronic LBBB.   Assessment/Plan   1. COPD exacerbation; acute hypoxic respiratory failure  - Presents with SOB, increased cough, and generalized weakness for a week and is found to be tachypneic and saturating low-80's at rest with clear CXR, negative COVID-19  - She was treated in ED with IV Solu-Medrol, nebs, and supplemental O2  - Check sputum culture, continue systemic steroid, start azithromycin, continue supplemental O2 as needed, continue ICS/LABA, and continue duonebs    2. Atrial fibrillation; supratherapeutic INR without bleeding  - In rate-controlled atrial fibrillation on admission  - CHADS-VASc is at least 48 (age x2, gender, CAD, CHF, DM)  - INR is 7.3 without bleeding  - Continue diltiazem, hold warfarin, repeat INR in am    3. Chronic diastolic CHF  - Appears compensated      4. Type II DM  - A1c was only 5.3% in April 2020 but she will be on systemic steroids as above  - Hold  Januvia, check CBG's, and use a low-intensity SSI with Novolog while in hospital    5. CKD stage III  - SCr is 1.25 on admission, similar to priors  - Renally-dose medications, monitor   6. Macrocytic anemia  - Hgb appears to be stable at 10.5 and she denies melena or hematochezia   7. Hypokalemia  - Serum potassium is 3.0 on admission  - She was given 40 mEq oral potassium and 2 g IV magnesium in ED   - Repeat chem panel in am    8. Headache  - Patient reports waking with a headache, now resolved, and reported generalized weakness without change in vision or hearing or focal numbness or weakness  - Head CT negative for acute findings and headache resolved spontaneously prior to admission, possibly from hypoxia   9. CAD  - She denies chest pain  - Continue statin    PPE: Mask, face shield  DVT prophylaxis: warfarin  Code Status: Full  Family Communication: Discussed with patient  Consults called: None  Admission status: Inpatient. The patient has acute hypoxic respiratory failure, has labored breathing at rest despite aggressive interventions in ED, appears cachectic and has severe comorbidity, and will requi    Vianne Bulls, MD [facility] Pager [phone]  If 7PM-7AM, please contact night-coverage [email or web address] Password [facility]  04/26/2019, 4:15 AM

## 2019-04-26 NOTE — Progress Notes (Signed)
Patient admitted to the hospital earlier this morning by Dr. Myna Hidalgo  Patient seen and examined. She is continued to feel short of breath.  She has mild expiratory wheezes bilaterally.  No lower extremity edema.  Patient admitted to the hospital later this morning with COPD exacerbation.  Currently on intravenous steroids, bronchodilators and antibiotics.  She still feels short of breath not quite back to baseline.  Continue current treatments and reevaluate in a.m.  Atrial fibrillation is rate controlled.  INR is elevated without any signs of bleeding.  Continue to hold warfarin and continue diltiazem for rate control.  Chronic diastolic heart failure is currently compensated.  Chronic kidney disease stage III, creatinine is currently similar to prior levels.  Continue to monitor.  Continue to monitor blood sugars with history of diabetes.  Januvia currently on hold.  Continue sliding scale insulin.  [employer]

## 2019-04-27 ENCOUNTER — Other Ambulatory Visit: Payer: Self-pay | Admitting: Cardiovascular Disease

## 2019-04-27 LAB — GLUCOSE, CAPILLARY
Glucose-Capillary: 137 mg/dL — ABNORMAL HIGH (ref 70–99)
Glucose-Capillary: 263 mg/dL — ABNORMAL HIGH (ref 70–99)

## 2019-04-27 LAB — HEMOGLOBIN A1C
Hgb A1c MFr Bld: 5.3 % (ref 4.8–5.6)
Mean Plasma Glucose: 105 mg/dL

## 2019-04-27 LAB — PROTIME-INR
INR: 6 (ref 0.8–1.2)
Prothrombin Time: 52.2 seconds — ABNORMAL HIGH (ref 11.4–15.2)

## 2019-04-27 MED ORDER — IPRATROPIUM-ALBUTEROL 0.5-2.5 (3) MG/3ML IN SOLN: RESPIRATORY_TRACT | 1 refills | Status: AC

## 2019-04-27 MED ORDER — WARFARIN SODIUM 2 MG PO TABS: ORAL_TABLET | ORAL | 1 refills | Status: DC

## 2019-04-27 MED ORDER — AZITHROMYCIN 500 MG PO TABS: 500.0000 mg | ORAL_TABLET | Freq: Every day | ORAL | 0 refills | Status: AC

## 2019-04-27 MED ORDER — PREDNISONE 10 MG PO TABS: ORAL_TABLET | ORAL | 0 refills | Status: DC

## 2019-04-27 NOTE — TOC Transition Note (Signed)
Transition of Care [facility]) - CM/SW Discharge Note   Patient Details  Name: [name] MRN: [id number] Date of Birth: 05-May-1935  [facility] of [facility]) CM/SW Contact:  Jayde Mcallister, Chauncey [place], RN Phone Number: 04/27/2019, 12:20 PM   Clinical Narrative:   Patient to DC home today with home health RN and PT. Juliann Pulse of [employer] will provide patient with a tank for continuous use, upgraded from her usual HS use. Patient will have her INR checked on Monday. Family to transport patient home.     Final next level of care: [place] Barriers to Discharge: No Barriers Identified   Patient Goals and CMS Choice Patient states their goals for this hospitalization and ongoing recovery are:: return home and breathe easier/feel better CMS Medicare.gov Compare Post Acute Care list provided to:: Patient Choice offered to / list presented to : Patient  Discharge Placement                       Discharge Plan and Services   Discharge Planning Services: CM Consult            DME Arranged: Oxygen DME Agency: [employer] Date DME Agency Contacted: 04/27/19 Time DME Agency Contacted: 1219 Representative spoke with at DME Agency: Juliann Pulse HH Arranged: RN, PT [facility] Agency: [place] ([place]) Date [place]: 04/27/19 Time [place]: 1220 Representative spoke with at [place]: [place] ([place]) Interventions     Readmission Risk Interventions Readmission Risk Prevention Plan 04/27/2019 04/26/2019  Transportation Screening - Complete  PCP or Specialist Appt within 3-5 Days Complete -  HRI or Home Care Consult Complete -  Social Work Consult for [place] Planning/Counseling - Complete  Palliative Care Screening - Not Applicable  Medication Review Press photographer) - Complete  Some recent data might be hidden

## 2019-04-27 NOTE — Care Management (Signed)
Home Health Agency InformationSorted ascending, Select to sort descending  [place] [phone]   [place] to my Favorites Quality of Patient Care Rating4 out of 5 stars Patient Survey Summary Rating4 out of [place] [phone]   [place] to my Favorites Quality of Patient Care Rating3 out of 5 stars Patient Survey Summary Rating5 out of [place] [phone]   [place] to my Favorites Quality of Patient Care Rating3 out of 5 stars Patient Survey Summary Rating4 out of [place] [phone]   [place] to my [place]  out of 5 stars Patient Survey Summary Rating4 out of [place] [phone]   [place] to my [place]  out of 5 stars Patient Survey Summary Rating3 out of [place] [phone]   Add [facility], [place] to my Favorites Quality of Patient Care Rating4 out of 5 stars Patient Survey Summary Rating4 out of [place] [phone]   [place] to my Favorites Quality of Patient Care Rating4 out of 5 stars Patient Survey Summary Rating4 out of [place] [phone]   [place] to my Favorites Quality of Patient Care Rating4 out of 5 stars Patient Survey Summary Rating4 out of [place] AGE [phone]   [place] AGE to my Favorites Quality of Patient Care Rating3 out of 5 stars Patient Survey Summary Rating3 out of 5 stars  [facility] [place] [phone]   Add [facility] [place] to my Favorites Quality of Patient Care Rating3  out of 5 stars Patient Survey Summary Rating4 out of [place] [phone]   [place] to my Favorites Quality of Patient Care Rating3 out of 5 stars Patient Survey Summary Rating4 out of 5 stars  [facility] [phone]   Add [facility] to my Favorites Quality of Patient Care Rating3  out of 5 stars Patient Survey Summary Rating3 out of [place] [phone]   Add [place] to my Favorites Quality of Patient Care Rating3  out of 5 stars Not [place] (336) [place] to my Favorites Quality of Patient [place]  out of 5 stars

## 2019-04-27 NOTE — Progress Notes (Signed)
IV removed, 2x2 gauze and paper tape applied to site, patient tolerated well.  Reviewed AVS with patient who verbalized understanding. Patient taken to lobby via wheelchair and transported home by her son.

## 2019-04-27 NOTE — Discharge Summary (Signed)
Physician Discharge Summary  [name] HKV:[id number] DOB: 09/11/1934 DOA: 04/25/2019  PCP: [name], FNP  Admit date: 04/25/2019 Discharge date: 04/27/2019  Admitted From: Home Disposition: Home  Recommendations for Outpatient Follow-up:  1. Follow up with PCP in 1-2 weeks 2. Please obtain BMP/CBC in one week 3. Repeat INR check on 9/21.  Restart Coumadin if INR less than 3  Home Health: Home health RN, PT Equipment/Devices: Oxygen at 2 L  Discharge Condition: Stable CODE STATUS: Full code Diet recommendation: Heart healthy, carb modified  Brief/Interim Summary: 84 year old female with a history of COPD, ongoing tobacco use, coronary artery disease, CHF, atrial fibrillation on warfarin presents to the hospital with cough, shortness of breath and generalized weakness.  She was found to have COPD exacerbation and admitted for further treatments.  Discharge Diagnoses:  Principal Problem:   COPD exacerbation ([place]) Active Problems:   Type 2 diabetes mellitus with kidney complication, without long-term current use of insulin (HCC)   Macrocytic anemia   Essential hypertension   Anxiety   Unspecified atrial fibrillation (HCC)   CAD (coronary artery disease)   Chronic diastolic CHF (congestive heart failure) (HCC)   CKD (chronic kidney disease), stage III (HCC)   Hypokalemia   Supratherapeutic INR  1. COPD exacerbation.  She was treated with intravenous antibiotics, steroids and bronchodilators.  Overall respiratory status has improved and appears to be approaching her baseline.  She will be started on a prednisone taper and continue on bronchodilators at home.  She normally wears oxygen at night, but will currently require it during the day.  She has been strongly advised to quit smoking. 2. Chronic respiratory failure with hypoxia. Now requiring oxygen during the day as well as at night.  Secondary to COPD exacerbation 3. Chronic kidney disease stage III.   Creatinine is currently similar to prior levels.  Continue to monitor. 4. Chronic diastolic heart failure.  Currently appears currently stable. 5. Atrial fibrillation.  Rate controlled.  She is anticoagulated with warfarin.  INR remains elevated and is slowly trending down, currently at 6.  Does not have any signs of bleeding.  We will continue to hold Warfarin for now and recheck on 9/21.  If INR is less than 3, can restart Coumadin.  Continue on diltiazem for rate control. 6. Diabetes.  Resume Januvia on discharge.  Blood sugars are currently stable.  Discharge Instructions  Discharge Instructions    Diet - low sodium heart healthy   Complete by: As directed    Increase activity slowly   Complete by: As directed      Allergies as of 04/27/2019      Reactions   Macrobid [nitrofurantoin Monohyd Macro] Rash   Torsemide Nausea And Vomiting   Tramadol Nausea Only      Medication List    STOP taking these medications   omeprazole 40 MG capsule Commonly known as: PRILOSEC     TAKE these medications   acetaminophen 500 MG tablet Commonly known as: TYLENOL Take 1,000 mg by mouth every 6 (six) hours as needed for mild pain, moderate pain or fever.   azithromycin 500 MG tablet Commonly known as: Zithromax Take 1 tablet (500 mg total) by mouth daily for 3 days. Take 1 tablet daily for 3 days.   budesonide-formoterol 160-4.5 MCG/ACT inhaler Commonly known as: Symbicort Inhale 2 puffs into the lungs 2 (two) times daily.   clonazePAM 0.5 MG tablet Commonly known as: KLONOPIN TAKE 1 TABLET 3 TIMES DAILY AS NEEDED FOR ANXIETY  diltiazem 120 MG tablet Commonly known as: CARDIZEM Take 1 tablet (120 mg total) by mouth daily.   ferrous sulfate 325 (65 FE) MG tablet Take 1 tablet (325 mg total) by mouth daily with breakfast.   HYDROcodone-acetaminophen 7.5-325 MG tablet Commonly known as: NORCO Take 1 tablet by mouth 3 (three) times daily as needed (pain).   ipratropium-albuterol  0.5-2.5 (3) MG/3ML Soln Commonly known as: DUONEB INHALE THE CONTENTS OF 1 VIAL VIA NEBULIZER EVERY 4 HOURS AS NEEDED   Januvia 50 MG tablet Generic drug: sitaGLIPtin TAKE 1 TABLET (50 MG TOTAL) BY MOUTH DAILY.   lipase/protease/amylase 36000 UNITS Cpep capsule Commonly known as: Creon TAKE 3 CAPSULES WITH MEALS  AND TAKE 1 CAPSULE WITH A SNACK AS DIRECTED   nitroGLYCERIN 0.4 MG SL tablet Commonly known as: NITROSTAT DISSOLVE 1 TABLET UNDER THE TONGUE EVERY 5 MINUTES AS NEEDED FOR CHEST PAIN AS DIRECTED   OXYGEN Inhale 2 L into the lungs at bedtime. 2lpm with sleep only   pantoprazole 40 MG tablet Commonly known as: PROTONIX Take 1 tablet (40 mg total) by mouth 2 (two) times daily.   [facility] Caps Take 1 capsule by mouth daily.   potassium chloride SA 20 MEQ tablet Commonly known as: K-DUR Take 1 tablet (20 mEq total) by mouth every morning.   predniSONE 10 MG tablet Commonly known as: DELTASONE Take 40mg  po daily for 2 days then 30mg  daily for 2 days then 20mg  daily for 2 days then 10mg  daily for 2 days then stop What changed: additional instructions   PreserVision AREDS 2 Caps Take 1 capsule by mouth daily.   simvastatin 10 MG tablet Commonly known as: ZOCOR TAKE 1 TABLET EVERY DAY   Ventolin HFA 108 (90 Base) MCG/ACT inhaler Generic drug: albuterol USE 2 PUFFS EVERY 6 HOURS AS NEEDED   warfarin 2 MG tablet Commonly known as: COUMADIN Take as directed. If you are unsure how to take this medication, talk to your nurse or doctor. Original instructions: TAKE 1/2  TABLET (1 MG TOTAL) DAILY AT 6 PM. Restart 9/21 if INR <3 What changed: See the new instructions.            Durable Medical Equipment  (From admission, onward)         Start     Ordered   04/27/19 1218  For home use only DME oxygen  Once    Question Answer Comment  Length of Need Lifetime   Mode or (Route) Nasal cannula   Liters per Minute 2   Frequency Continuous (stationary  and portable oxygen unit needed)   Oxygen conserving device Yes   Oxygen delivery system Gas      04/27/19 1217         Follow-up Information    [name], FNP. Schedule an appointment as soon as possible for a visit in 2 week(s).   Specialty: Family Medicine Why: appointment September 24 at 11:15 Contact information: [place] [address] [phone]        Herminio Commons, MD .   Specialty: Cardiology Contact information: [place] [address] [phone]        Thompson Grayer, MD .   Specialty: Cardiology Contact information: [place] [address] [phone]          Allergies  Allergen Reactions  . Macrobid [Nitrofurantoin Monohyd Macro] Rash  . Torsemide Nausea And Vomiting  . Tramadol Nausea  Only    Consultations:     Procedures/Studies: Ct Head Wo Contrast  Result Date: 04/26/2019 CLINICAL DATA:  Weakness, cough and shortness of breath for 1 week EXAM: CT HEAD WITHOUT CONTRAST TECHNIQUE: Contiguous axial images were obtained from the base of the skull through the vertex without intravenous contrast. COMPARISON:  CT 10/19/2017 FINDINGS: Imaging quality is motion degraded. Brain: No evidence of acute infarction, hemorrhage, hydrocephalus, extra-axial collection or mass lesion/mass effect. Symmetric prominence of the ventricles, cisterns and sulci compatible with parenchymal volume loss. Patchy areas of white matter hypoattenuation are most compatible with chronic microvascular angiopathy. Vascular: Atherosclerotic calcification of the carotid siphons and intradural vertebral arteries. No hyperdense vessel. Skull: No calvarial fracture or suspicious osseous lesion. No scalp swelling or hematoma. Sinuses/Orbits: Retention cyst present in the right maxillary sinus. Remaining paranasal sinuses and mastoid air cells are predominantly clear. Included orbital structures are  unremarkable. Other: None IMPRESSION: Motion degraded study. No definite acute intracranial abnormality. Chronic microvascular changes and parenchymal volume loss are similar to prior. Electronically Signed   By: [name] M.D.   On: 04/26/2019 01:22   Dg Chest Portable 1 View  Result Date: 04/25/2019 CLINICAL DATA:  Shortness of breath EXAM: PORTABLE CHEST 1 VIEW COMPARISON:  08/11/2018, 08/07/2017 FINDINGS: Left-sided pacing device as before. Stable cardiomediastinal silhouette with aortic atherosclerosis. No focal consolidation or effusion. Chronic appearing bronchitic changes. No pneumothorax. Stable sclerotic focus in the right humeral head. IMPRESSION: No active disease. Chronic interstitial opacities. Mild cardiomegaly Electronically Signed   By: Donavan Foil M.D.   On: 04/25/2019 23:10       Subjective: Able to walk with physical therapy.  Still feels mildly short of breath, but approaching baseline.  Discharge Exam: Vitals:   04/27/19 0539 04/27/19 0600 04/27/19 0744 04/27/19 0749  BP: 125/89     Pulse: 70     Resp: 20     Temp: 98.8 F (37.1 C)     TempSrc: Oral     SpO2: 94%  93% 96%  Weight:  49.1 kg    Height:        General: Pt is alert, awake, not in acute distress Cardiovascular: RRR, S1/S2 +, no rubs, no gallops Respiratory: Mild wheeze bilaterally, normal respiratory effort, able to carry on a conversation Abdominal: Soft, NT, ND, bowel sounds + Extremities: no edema, no cyanosis    The results of significant diagnostics from this hospitalization (including imaging, microbiology, ancillary and laboratory) are listed below for reference.     Microbiology: Recent Results (from the past 240 hour(s))  SARS Coronavirus 2 [facility] order, Performed in [facility] hospital lab) Nasopharyngeal Nasopharyngeal Swab     Status: None   Collection Time: 04/25/19 10:15 PM   Specimen: Nasopharyngeal Swab  Result Value Ref Range Status   SARS Coronavirus 2 NEGATIVE  NEGATIVE Final    Comment: (NOTE) If result is NEGATIVE SARS-CoV-2 target nucleic acids are NOT DETECTED. The SARS-CoV-2 RNA is generally detectable in upper and lower  respiratory specimens during the acute phase of infection. The lowest  concentration of SARS-CoV-2 viral copies this assay can detect is 250  copies / mL. A negative result does not preclude SARS-CoV-2 infection  and should not be used as the sole basis for treatment or other  patient management decisions.  A negative result may occur with  improper specimen collection / handling, submission of specimen other  than nasopharyngeal swab, presence of viral mutation(s) within the  areas targeted by this assay, and  inadequate number of viral copies  (<250 copies / mL). A negative result must be combined with clinical  observations, patient history, and epidemiological information. If result is POSITIVE SARS-CoV-2 target nucleic acids are DETECTED. The SARS-CoV-2 RNA is generally detectable in upper and lower  respiratory specimens dur ing the acute phase of infection.  Positive  results are indicative of active infection with SARS-CoV-2.  Clinical  correlation with patient history and other diagnostic information is  necessary to determine patient infection status.  Positive results do  not rule out bacterial infection or co-infection with other viruses. If result is PRESUMPTIVE POSTIVE SARS-CoV-2 nucleic acids MAY BE PRESENT.   A presumptive positive result was obtained on the submitted specimen  and confirmed on repeat testing.  While 2019 novel coronavirus  (SARS-CoV-2) nucleic acids may be present in the submitted sample  additional confirmatory testing may be necessary for epidemiological  and / or clinical management purposes  to differentiate between  SARS-CoV-2 and other Sarbecovirus currently known to infect humans.  If clinically indicated additional testing with an alternate test  methodology [phone]) is  advised. The SARS-CoV-2 RNA is generally  detectable in upper and lower respiratory sp ecimens during the acute  phase of infection. The expected result is Negative. Fact Sheet for Patients:  StrictlyIdeas.no Fact Sheet for Healthcare Providers: BankingDealers.co.za This test is not yet approved or cleared by the [place] FDA and has been authorized for detection and/or diagnosis of SARS-CoV-2 by FDA under an Emergency Use Authorization (EUA).  This EUA will remain in effect (meaning this test can be used) for the duration of the COVID-19 declaration under Section 564(b)(1) of the Act, 21 U.S.C. section 360bbb-3(b)(1), unless the authorization is terminated or revoked sooner. Performed at [facility], [address] [place], [place] [address]      Labs: BNP (last 3 results) Recent Labs    04/25/19 2223  BNP 993.5*   Basic Metabolic Panel: Recent Labs  Lab 04/25/19 2223 04/26/19 0435  NA 143 144  K 3.0* 3.3*  CL 102 103  CO2 32 31  GLUCOSE 106* 202*  BUN 18 17  CREATININE 1.25* 1.20*  CALCIUM 9.4 9.7  MG  --  3.1*   Liver Function Tests: Recent Labs  Lab 04/25/19 2223  AST 31  ALT 19  ALKPHOS 80  BILITOT 0.5  PROT 6.9  ALBUMIN 3.1*   No results for input(s): LIPASE, AMYLASE in the last 168 hours. No results for input(s): AMMONIA in the last 168 hours. CBC: Recent Labs  Lab 04/25/19 2223 04/26/19 0435  WBC 8.2 6.8  NEUTROABS 5.6  --   HGB 10.5* 11.4*  HCT 34.8* 37.2  MCV 106.7* 106.6*  PLT 211 201   Cardiac Enzymes: No results for input(s): CKTOTAL, CKMB, CKMBINDEX, TROPONINI in the last 168 hours. BNP: Invalid input(s): POCBNP CBG: Recent Labs  Lab 04/26/19 1102 04/26/19 1610 04/26/19 2127 04/27/19 0728 04/27/19 1124  GLUCAP 213* 192* 183* 137* 263*   D-Dimer No results for input(s): DDIMER in the last 72 hours. Hgb A1c Recent Labs    04/26/19 0435  HGBA1C 5.3   Lipid Profile No  results for input(s): CHOL, HDL, LDLCALC, TRIG, CHOLHDL, LDLDIRECT in the last 72 hours. Thyroid function studies No results for input(s): TSH, T4TOTAL, T3FREE, THYROIDAB in the last 72 hours.  Invalid input(s): FREET3 Anemia work up No results for input(s): VITAMINB12, FOLATE, FERRITIN, TIBC, IRON, RETICCTPCT in the last 72 hours. Urinalysis    Component Value Date/Time  COLORURINE YELLOW 11/25/2015 2255   APPEARANCEUR Cloudy (A) 08/25/2017 1732   LABSPEC 1.010 11/25/2015 2255   PHURINE 7.0 11/25/2015 2255   GLUCOSEU Negative 08/25/2017 1732   HGBUR NEGATIVE 11/25/2015 2255   BILIRUBINUR Negative 08/25/2017 1732   KETONESUR NEGATIVE 11/25/2015 2255   PROTEINUR 2+ (A) 08/25/2017 1732   PROTEINUR NEGATIVE 11/25/2015 2255   UROBILINOGEN negative 02/28/2015 1602   UROBILINOGEN 0.2 08/21/2013 1922   NITRITE Negative 08/25/2017 1732   NITRITE NEGATIVE 11/25/2015 2255   LEUKOCYTESUR 1+ (A) 08/25/2017 1732   Sepsis Labs Invalid input(s): PROCALCITONIN,  WBC,  LACTICIDVEN Microbiology Recent Results (from the past 240 hour(s))  SARS Coronavirus 2 [facility] order, Performed in [facility] hospital lab) Nasopharyngeal Nasopharyngeal Swab     Status: None   Collection Time: 04/25/19 10:15 PM   Specimen: Nasopharyngeal Swab  Result Value Ref Range Status   SARS Coronavirus 2 NEGATIVE NEGATIVE Final    Comment: (NOTE) If result is NEGATIVE SARS-CoV-2 target nucleic acids are NOT DETECTED. The SARS-CoV-2 RNA is generally detectable in upper and lower  respiratory specimens during the acute phase of infection. The lowest  concentration of SARS-CoV-2 viral copies this assay can detect is 250  copies / mL. A negative result does not preclude SARS-CoV-2 infection  and should not be used as the sole basis for treatment or other  patient management decisions.  A negative result may occur with  improper specimen collection / handling, submission of specimen other  than nasopharyngeal swab,  presence of viral mutation(s) within the  areas targeted by this assay, and inadequate number of viral copies  (<250 copies / mL). A negative result must be combined with clinical  observations, patient history, and epidemiological information. If result is POSITIVE SARS-CoV-2 target nucleic acids are DETECTED. The SARS-CoV-2 RNA is generally detectable in upper and lower  respiratory specimens dur ing the acute phase of infection.  Positive  results are indicative of active infection with SARS-CoV-2.  Clinical  correlation with patient history and other diagnostic information is  necessary to determine patient infection status.  Positive results do  not rule out bacterial infection or co-infection with other viruses. If result is PRESUMPTIVE POSTIVE SARS-CoV-2 nucleic acids MAY BE PRESENT.   A presumptive positive result was obtained on the submitted specimen  and confirmed on repeat testing.  While 2019 novel coronavirus  (SARS-CoV-2) nucleic acids may be present in the submitted sample  additional confirmatory testing may be necessary for epidemiological  and / or clinical management purposes  to differentiate between  SARS-CoV-2 and other Sarbecovirus currently known to infect humans.  If clinically indicated additional testing with an alternate test  methodology [phone]) is advised. The SARS-CoV-2 RNA is generally  detectable in upper and lower respiratory sp ecimens during the acute  phase of infection. The expected result is Negative. Fact Sheet for Patients:  StrictlyIdeas.no Fact Sheet for Healthcare Providers: BankingDealers.co.za This test is not yet approved or cleared by the [place] FDA and has been authorized for detection and/or diagnosis of SARS-CoV-2 by FDA under an Emergency Use Authorization (EUA).  This EUA will remain in effect (meaning this test can be used) for the duration of the COVID-19 declaration under  Section 564(b)(1) of the Act, 21 U.S.C. section 360bbb-3(b)(1), unless the authorization is terminated or revoked sooner. Performed at [facility], [address] [place],  [address]      Time coordinating discharge: 11mins  SIGNED:   Kathie Dike, MD  [facility] 04/27/2019,  12:34 PM   If 7PM-7AM, please contact night-coverage [email or web address]

## 2019-04-27 NOTE — Progress Notes (Signed)
CRITICAL VALUE ALERT  Critical Value: INR 6.0  Date & Time Notied:  04/27/2019 3299  Provider Notified: Dr. Kathie Dike   Orders Received/Actions taken: awaiting call back/ orders

## 2019-04-27 NOTE — Evaluation (Signed)
Physical Therapy Evaluation Patient Details Name: [name] MRN: [id number] DOB: 10-16-1934 Today's Date: 04/27/2019   History of Present Illness  Lanise Mergen is a 84 y.o. female with medical history significant for COPD, coronary artery disease, chronic diastolic CHF, atrial fibrillation on warfarin, chronic kidney disease stage III, chronic anemia, and tachy-bradycardia syndrome with pacer, now presenting to the emergency department for evaluation of increased cough, shortness of breath, headaches, and general weakness and malaise.  Patient reports waking with mild headaches recently, experiencing some general malaise and generalized weakness without change in vision or hearing or focal numbness or weakness, and has also been experiencing increase in her chronic cough and shortness of breath, all for the past week.  She denies fevers or chills, denies chest pain, and denies headache at this time.  She has inhalers at home and nebulizer machine, but has not tried those.  She reports that her legs will swell occasionally, but not now.    Clinical Impression  Patient functioning near baseline for functional mobility and gait, had difficulty using her quad-cane with near loss of balance, demonstrated good return for using RW for transfers and ambulation without loss of balance and encouraged to use RW at home and practice using quad-cane with HHPT.  Patient to be discharged home today and discharged from physical therapy to care of nursing for ambulation daily as tolerated for length of stay.     Follow Up Recommendations Home health PT;Supervision for mobility/OOB;Supervision - Intermittent    Equipment Recommendations  None recommended by PT    Recommendations for Other Services       Precautions / Restrictions Precautions Precautions: Fall Restrictions Weight Bearing Restrictions: No      Mobility  Bed Mobility Overal bed mobility: Modified Independent              General bed mobility comments: increased time  Transfers Overall transfer level: Needs assistance Equipment used: Rolling walker (2 wheeled);Quad cane Transfers: Sit to/from [employer] to Stand: Supervision Stand pivot transfers: Supervision       General transfer comment: unsteady unsing quad-cane with near loss of balance, safer using RW  Ambulation/Gait Ambulation/Gait assistance: Supervision Gait Distance (Feet): 65 Feet Assistive device: Rolling walker (2 wheeled) Gait Pattern/deviations: Decreased step length - right;Decreased step length - left;Decreased stride length Gait velocity: decreased   General Gait Details: slightly labored cadence without loss of balance while on 2 LPM with SpO2 at 90-94%  Stairs            Wheelchair Mobility    Modified Rankin (Stroke Patients Only)       Balance Overall balance assessment: Needs assistance Sitting-balance support: Feet supported;No upper extremity supported Sitting balance-Leahy Scale: Good Sitting balance - Comments: seated at bedside   Standing balance support: During functional activity;No upper extremity supported Standing balance-Leahy Scale: Poor Standing balance comment: fair/poor without AD and Quad cane, fair/good using RW                             Pertinent Vitals/Pain Pain Assessment: No/denies pain    Home Living Family/patient expects to be discharged to:: Private residence Living Arrangements: Children Available Help at Discharge: Family;Available PRN/intermittently Type of Home: Mobile home Home Access: Stairs to enter;Ramped entrance Entrance Stairs-Rails: Left Entrance Stairs-Number of Steps: 2 Home Layout: One level Home Equipment: Kasandra Knudsen - quad;Walker - 2 wheels;Toilet riser      Prior Function Level of Independence: Independent  with assistive device(s)         Comments: household and short distanced community ambulator with quad-cane      Hand Dominance   Dominant Hand: Left    Extremity/Trunk Assessment   Upper Extremity Assessment Upper Extremity Assessment: Overall WFL for tasks assessed    Lower Extremity Assessment Lower Extremity Assessment: Generalized weakness    Cervical / Trunk Assessment Cervical / Trunk Assessment: Kyphotic  Communication   Communication: No difficulties  Cognition Arousal/Alertness: Awake/alert Behavior During Therapy: WFL for tasks assessed/performed Overall Cognitive Status: Within Functional Limits for tasks assessed                                        General Comments      Exercises     Assessment/Plan    PT Assessment All further PT needs can be met in the next venue of care  PT Problem List Decreased strength;Decreased activity tolerance;Decreased balance;Decreased mobility       PT Treatment Interventions      PT Goals (Current goals can be found in the Care Plan section)  Acute Rehab PT Goals Patient Stated Goal: return home with family to assist PT Goal Formulation: With patient Time For Goal Achievement: 04/27/19 Potential to Achieve Goals: Good    Frequency     Barriers to discharge        Co-evaluation               AM-PAC PT "6 Clicks" Mobility  Outcome Measure Help needed turning from your back to your side while in a flat bed without using bedrails?: None Help needed moving from lying on your back to sitting on the side of a flat bed without using bedrails?: None Help needed moving to and from a bed to a chair (including a wheelchair)?: A Little Help needed standing up from a chair using your arms (e.g., wheelchair or bedside chair)?: A Little Help needed to walk in hospital room?: A Little Help needed climbing 3-5 steps with a railing? : A Little 6 Click Score: 20    End of Session Equipment Utilized During Treatment: Oxygen Activity Tolerance: Patient tolerated treatment well;Patient limited by  fatigue Patient left: in chair;with call bell/phone within reach Nurse Communication: Mobility status PT Visit Diagnosis: Unsteadiness on feet (R26.81);Other abnormalities of gait and mobility (R26.89);Muscle weakness (generalized) (M62.81)    Time: 9090-3014 PT Time Calculation (min) (ACUTE ONLY): 30 min   Charges:   PT Evaluation $PT Eval Moderate Complexity: 1 Mod          12:34 PM, 04/27/19 Lonell Grandchild, MPT Physical Therapist with [facility] [phone] office [phone] mobile phone

## 2019-04-27 NOTE — Progress Notes (Signed)
SATURATION QUALIFICATIONS: (This note is used to comply with regulatory documentation for home oxygen)  Patient Saturations on Room Air at Rest = 90%  Patient Saturations on Room Air while Ambulating = 84%  Patient Saturations on 2 Liters of oxygen while Ambulating = 93%  Please briefly explain why patient needs home oxygen: Patient desats quickly when ambulating on room air, patient requires 2 L/min to maintain O2 sats above 90 % when ambulating.

## 2019-05-04 ENCOUNTER — Ambulatory Visit (INDEPENDENT_AMBULATORY_CARE_PROVIDER_SITE_OTHER): Payer: Medicare HMO | Admitting: Nurse Practitioner

## 2019-05-04 ENCOUNTER — Encounter: Payer: Self-pay | Admitting: Nurse Practitioner

## 2019-05-04 DIAGNOSIS — J441 Chronic obstructive pulmonary disease with (acute) exacerbation: Secondary | ICD-10-CM | POA: Diagnosis not present

## 2019-05-04 DIAGNOSIS — Z7901 Long term (current) use of anticoagulants: Secondary | ICD-10-CM

## 2019-05-04 DIAGNOSIS — E876 Hypokalemia: Secondary | ICD-10-CM | POA: Diagnosis not present

## 2019-05-04 DIAGNOSIS — Z09 Encounter for follow-up examination after completed treatment for conditions other than malignant neoplasm: Secondary | ICD-10-CM | POA: Diagnosis not present

## 2019-05-04 NOTE — Telephone Encounter (Signed)
Pt due for Prolia Pt in hospital at time of call Will call back to schedule

## 2019-05-04 NOTE — Progress Notes (Signed)
   Virtual Visit via telephone Note Due to COVID-19 pandemic this visit was conducted virtually. This visit type was conducted due to national recommendations for restrictions regarding the COVID-19 Pandemic (e.g. social distancing, sheltering in place) in an effort to limit this patient's exposure and mitigate transmission in our community. All issues noted in this document were discussed and addressed.  A physical exam was not performed with this format.  I connected with [name] on 05/04/19 at 11:05 by telephone and verified that I am speaking with the correct person using two identifiers. [name] is currently located at home and no one is currently with her during visit. The provider, [name], FNP is located in their office at time of visit.  I discussed the limitations, risks, security and privacy concerns of performing an evaluation and management service by telephone and the availability of in person appointments. I also discussed with the patient that there may be a patient responsible charge related to this service. The patient expressed understanding and agreed to proceed.   History and Present Illness:  Patient went to er 04/25/19. She just felt so bad and was coughing. She was dx with exacerbation of COPD, hypokalemia and INR was 7.5. stayed 2 nights and was given antibiotics, potassium and was holding coumadin. She is feeling some better since going home. Is still coughing and is very week.    Review of Systems  Constitutional: Positive for malaise/fatigue. Negative for chills and fever.  HENT: Negative.   Respiratory: Positive for cough. Negative for shortness of breath and wheezing.   Cardiovascular: Positive for leg swelling.  Gastrointestinal: Negative.   Genitourinary: Negative.   Neurological: Positive for weakness.  Psychiatric/Behavioral: Negative.      Observations/Objective: Alert and oriented- answers all questions appropriately Mild  distress Wet cough during phone visit   Assessment and Plan: COPD exacerbation ([place])  Hypokalemia  Chronic anticoagulation  Hospital discharge follow-up  Hospital records reviewed continue all current meds Will try to get home health to do labs Call if not improving daily   Follow Up Instructions: prn    I discussed the assessment and treatment plan with the patient. The patient was provided an opportunity to ask questions and all were answered. The patient agreed with the plan and demonstrated an understanding of the instructions.   The patient was advised to call back or seek an in-person evaluation if the symptoms worsen or if the condition fails to improve as anticipated.  The above assessment and management plan was discussed with the patient. The patient verbalized understanding of and has agreed to the management plan. Patient is aware to call the clinic if symptoms persist or worsen. Patient is aware when to return to the clinic for a follow-up visit. Patient educated on when it is appropriate to go to the emergency department.   Time call ended:  11:18  I provided 13 minutes of non-face-to-face time during this encounter.    [name], FNP

## 2019-05-05 ENCOUNTER — Other Ambulatory Visit: Payer: Self-pay | Admitting: Nurse Practitioner

## 2019-05-05 ENCOUNTER — Telehealth: Payer: Self-pay | Admitting: Family Medicine

## 2019-05-05 DIAGNOSIS — F419 Anxiety disorder, unspecified: Secondary | ICD-10-CM

## 2019-05-05 NOTE — Telephone Encounter (Signed)
**  [place] [facility] After Hours/ Emergency Line Call**  Patient: [name] .  PCP: [name], FNP  Patient reports that she is run out of her Klonopin.  I reviewed her chart and it appears that her PCP gave her a prescription for 31-month supply back in June.  She has had early refills on multiple occasions.  She filled the prescription on 6/6 then again on 7/2.  Followed by 03/10/2019 and again on 04/08/2019.  I reiterated that she should certainly not be out of her medications as she has had several early refills.  I will forward over to her PCP given this high risk use and red flag findings.  She will need to follow-up with PCP for ongoing refills of this controlled substance.  Alverda Nazzaro M. Lajuana Ripple, DO

## 2019-05-09 ENCOUNTER — Encounter: Payer: Self-pay | Admitting: Nurse Practitioner

## 2019-05-09 ENCOUNTER — Encounter (HOSPITAL_COMMUNITY): Payer: Self-pay

## 2019-05-09 ENCOUNTER — Ambulatory Visit (INDEPENDENT_AMBULATORY_CARE_PROVIDER_SITE_OTHER): Payer: Medicare HMO | Admitting: Nurse Practitioner

## 2019-05-09 ENCOUNTER — Emergency Department (HOSPITAL_COMMUNITY): Payer: Medicare HMO

## 2019-05-09 ENCOUNTER — Other Ambulatory Visit: Payer: Self-pay

## 2019-05-09 ENCOUNTER — Inpatient Hospital Stay (HOSPITAL_COMMUNITY)
Admission: EM | Admit: 2019-05-09 | Discharge: 2019-05-16 | DRG: 177 | Disposition: A | Discharge status: Home Health | Payer: Medicare HMO | Attending: Internal Medicine | Admitting: Internal Medicine

## 2019-05-09 DIAGNOSIS — N179 Acute kidney failure, unspecified: Secondary | ICD-10-CM | POA: Diagnosis not present

## 2019-05-09 DIAGNOSIS — J9611 Chronic respiratory failure with hypoxia: Secondary | ICD-10-CM | POA: Diagnosis present

## 2019-05-09 DIAGNOSIS — E1129 Type 2 diabetes mellitus with other diabetic kidney complication: Secondary | ICD-10-CM | POA: Diagnosis present

## 2019-05-09 DIAGNOSIS — Z7984 Long term (current) use of oral hypoglycemic drugs: Secondary | ICD-10-CM

## 2019-05-09 DIAGNOSIS — I5033 Acute on chronic diastolic (congestive) heart failure: Secondary | ICD-10-CM | POA: Diagnosis present

## 2019-05-09 DIAGNOSIS — Z720 Tobacco use: Secondary | ICD-10-CM

## 2019-05-09 DIAGNOSIS — Z801 Family history of malignant neoplasm of trachea, bronchus and lung: Secondary | ICD-10-CM

## 2019-05-09 DIAGNOSIS — E1122 Type 2 diabetes mellitus with diabetic chronic kidney disease: Secondary | ICD-10-CM

## 2019-05-09 DIAGNOSIS — N183 Chronic kidney disease, stage 3 unspecified: Secondary | ICD-10-CM | POA: Diagnosis present

## 2019-05-09 DIAGNOSIS — I10 Essential (primary) hypertension: Secondary | ICD-10-CM

## 2019-05-09 DIAGNOSIS — I4891 Unspecified atrial fibrillation: Secondary | ICD-10-CM | POA: Diagnosis present

## 2019-05-09 DIAGNOSIS — D631 Anemia in chronic kidney disease: Secondary | ICD-10-CM | POA: Diagnosis present

## 2019-05-09 DIAGNOSIS — I4821 Permanent atrial fibrillation: Secondary | ICD-10-CM | POA: Diagnosis present

## 2019-05-09 DIAGNOSIS — J189 Pneumonia, unspecified organism: Secondary | ICD-10-CM | POA: Diagnosis not present

## 2019-05-09 DIAGNOSIS — I130 Hypertensive heart and chronic kidney disease with heart failure and stage 1 through stage 4 chronic kidney disease, or unspecified chronic kidney disease: Secondary | ICD-10-CM | POA: Diagnosis present

## 2019-05-09 DIAGNOSIS — I428 Other cardiomyopathies: Secondary | ICD-10-CM | POA: Diagnosis present

## 2019-05-09 DIAGNOSIS — I482 Chronic atrial fibrillation, unspecified: Secondary | ICD-10-CM | POA: Diagnosis present

## 2019-05-09 DIAGNOSIS — Z7901 Long term (current) use of anticoagulants: Secondary | ICD-10-CM

## 2019-05-09 DIAGNOSIS — J449 Chronic obstructive pulmonary disease, unspecified: Secondary | ICD-10-CM

## 2019-05-09 DIAGNOSIS — E785 Hyperlipidemia, unspecified: Secondary | ICD-10-CM

## 2019-05-09 DIAGNOSIS — J1289 Other viral pneumonia: Secondary | ICD-10-CM | POA: Diagnosis present

## 2019-05-09 DIAGNOSIS — Z885 Allergy status to narcotic agent status: Secondary | ICD-10-CM

## 2019-05-09 DIAGNOSIS — I5032 Chronic diastolic (congestive) heart failure: Secondary | ICD-10-CM | POA: Diagnosis not present

## 2019-05-09 DIAGNOSIS — R0602 Shortness of breath: Secondary | ICD-10-CM

## 2019-05-09 DIAGNOSIS — R778 Other specified abnormalities of plasma proteins: Secondary | ICD-10-CM | POA: Diagnosis present

## 2019-05-09 DIAGNOSIS — I2510 Atherosclerotic heart disease of native coronary artery without angina pectoris: Secondary | ICD-10-CM | POA: Diagnosis present

## 2019-05-09 DIAGNOSIS — U071 COVID-19: Secondary | ICD-10-CM | POA: Diagnosis present

## 2019-05-09 DIAGNOSIS — Z803 Family history of malignant neoplasm of breast: Secondary | ICD-10-CM

## 2019-05-09 DIAGNOSIS — E7800 Pure hypercholesterolemia, unspecified: Secondary | ICD-10-CM | POA: Diagnosis present

## 2019-05-09 DIAGNOSIS — K219 Gastro-esophageal reflux disease without esophagitis: Secondary | ICD-10-CM | POA: Diagnosis not present

## 2019-05-09 DIAGNOSIS — J1282 Pneumonia due to coronavirus disease 2019: Secondary | ICD-10-CM

## 2019-05-09 DIAGNOSIS — G4730 Sleep apnea, unspecified: Secondary | ICD-10-CM | POA: Diagnosis present

## 2019-05-09 DIAGNOSIS — F419 Anxiety disorder, unspecified: Secondary | ICD-10-CM | POA: Diagnosis present

## 2019-05-09 DIAGNOSIS — Z950 Presence of cardiac pacemaker: Secondary | ICD-10-CM

## 2019-05-09 DIAGNOSIS — E876 Hypokalemia: Secondary | ICD-10-CM

## 2019-05-09 DIAGNOSIS — E1121 Type 2 diabetes mellitus with diabetic nephropathy: Secondary | ICD-10-CM

## 2019-05-09 DIAGNOSIS — Z881 Allergy status to other antibiotic agents status: Secondary | ICD-10-CM

## 2019-05-09 DIAGNOSIS — Z9981 Dependence on supplemental oxygen: Secondary | ICD-10-CM | POA: Diagnosis not present

## 2019-05-09 DIAGNOSIS — Z9181 History of falling: Secondary | ICD-10-CM

## 2019-05-09 DIAGNOSIS — Z7951 Long term (current) use of inhaled steroids: Secondary | ICD-10-CM

## 2019-05-09 DIAGNOSIS — I495 Sick sinus syndrome: Secondary | ICD-10-CM | POA: Diagnosis present

## 2019-05-09 DIAGNOSIS — D539 Nutritional anemia, unspecified: Secondary | ICD-10-CM | POA: Diagnosis present

## 2019-05-09 DIAGNOSIS — R7989 Other specified abnormal findings of blood chemistry: Secondary | ICD-10-CM

## 2019-05-09 DIAGNOSIS — J9621 Acute and chronic respiratory failure with hypoxia: Secondary | ICD-10-CM | POA: Diagnosis present

## 2019-05-09 DIAGNOSIS — I447 Left bundle-branch block, unspecified: Secondary | ICD-10-CM | POA: Diagnosis present

## 2019-05-09 DIAGNOSIS — J181 Lobar pneumonia, unspecified organism: Secondary | ICD-10-CM

## 2019-05-09 DIAGNOSIS — K439 Ventral hernia without obstruction or gangrene: Secondary | ICD-10-CM | POA: Diagnosis present

## 2019-05-09 DIAGNOSIS — T502X5A Adverse effect of carbonic-anhydrase inhibitors, benzothiadiazides and other diuretics, initial encounter: Secondary | ICD-10-CM | POA: Diagnosis not present

## 2019-05-09 DIAGNOSIS — R1030 Lower abdominal pain, unspecified: Secondary | ICD-10-CM

## 2019-05-09 DIAGNOSIS — Z808 Family history of malignant neoplasm of other organs or systems: Secondary | ICD-10-CM

## 2019-05-09 DIAGNOSIS — Z823 Family history of stroke: Secondary | ICD-10-CM

## 2019-05-09 DIAGNOSIS — I340 Nonrheumatic mitral (valve) insufficiency: Secondary | ICD-10-CM | POA: Diagnosis present

## 2019-05-09 DIAGNOSIS — J440 Chronic obstructive pulmonary disease with acute lower respiratory infection: Secondary | ICD-10-CM | POA: Diagnosis present

## 2019-05-09 DIAGNOSIS — Z8051 Family history of malignant neoplasm of kidney: Secondary | ICD-10-CM

## 2019-05-09 DIAGNOSIS — S81802S Unspecified open wound, left lower leg, sequela: Secondary | ICD-10-CM

## 2019-05-09 DIAGNOSIS — Z8249 Family history of ischemic heart disease and other diseases of the circulatory system: Secondary | ICD-10-CM

## 2019-05-09 DIAGNOSIS — L03116 Cellulitis of left lower limb: Secondary | ICD-10-CM | POA: Diagnosis present

## 2019-05-09 DIAGNOSIS — D696 Thrombocytopenia, unspecified: Secondary | ICD-10-CM | POA: Diagnosis present

## 2019-05-09 DIAGNOSIS — F17210 Nicotine dependence, cigarettes, uncomplicated: Secondary | ICD-10-CM | POA: Diagnosis present

## 2019-05-09 DIAGNOSIS — I351 Nonrheumatic aortic (valve) insufficiency: Secondary | ICD-10-CM | POA: Diagnosis not present

## 2019-05-09 LAB — URINALYSIS, ROUTINE W REFLEX MICROSCOPIC
Bilirubin Urine: NEGATIVE
Glucose, UA: NEGATIVE mg/dL
Hgb urine dipstick: NEGATIVE
Ketones, ur: NEGATIVE mg/dL
Leukocytes,Ua: NEGATIVE
Nitrite: NEGATIVE
Protein, ur: 100 mg/dL — AB
Specific Gravity, Urine: 1.018 (ref 1.005–1.030)
pH: 7 (ref 5.0–8.0)

## 2019-05-09 LAB — COMPREHENSIVE METABOLIC PANEL
ALT: 27 U/L (ref 0–44)
AST: 32 U/L (ref 15–41)
Albumin: 2.8 g/dL — ABNORMAL LOW (ref 3.5–5.0)
Alkaline Phosphatase: 78 U/L (ref 38–126)
Anion gap: 8 (ref 5–15)
BUN: 28 mg/dL — ABNORMAL HIGH (ref 8–23)
CO2: 32 mmol/L (ref 22–32)
Calcium: 8.3 mg/dL — ABNORMAL LOW (ref 8.9–10.3)
Chloride: 96 mmol/L — ABNORMAL LOW (ref 98–111)
Creatinine, Ser: 1.19 mg/dL — ABNORMAL HIGH (ref 0.44–1.00)
GFR calc Af Amer: 49 mL/min — ABNORMAL LOW (ref >60)
GFR calc non Af Amer: 42 mL/min — ABNORMAL LOW (ref >60)
Glucose, Bld: 111 mg/dL — ABNORMAL HIGH (ref 70–99)
Potassium: 3.6 mmol/L (ref 3.5–5.1)
Sodium: 136 mmol/L (ref 135–145)
Total Bilirubin: 0.6 mg/dL (ref 0.3–1.2)
Total Protein: 6.3 g/dL — ABNORMAL LOW (ref 6.5–8.1)

## 2019-05-09 LAB — CBC
HCT: 36 % (ref 36.0–46.0)
Hemoglobin: 10.9 g/dL — ABNORMAL LOW (ref 12.0–15.0)
MCH: 32.2 pg (ref 26.0–34.0)
MCHC: 30.3 g/dL (ref 30.0–36.0)
MCV: 106.2 fL — ABNORMAL HIGH (ref 80.0–100.0)
Platelets: 123 10*3/uL — ABNORMAL LOW (ref 150–400)
RBC: 3.39 MIL/uL — ABNORMAL LOW (ref 3.87–5.11)
RDW: 14.6 % (ref 11.5–15.5)
WBC: 7.9 10*3/uL (ref 4.0–10.5)
nRBC: 0 % (ref 0.0–0.2)

## 2019-05-09 LAB — LIPASE, BLOOD: Lipase: 21 U/L (ref 11–51)

## 2019-05-09 LAB — TROPONIN I (HIGH SENSITIVITY): Troponin I (High Sensitivity): 30 ng/L — ABNORMAL HIGH (ref <18)

## 2019-05-09 MED ORDER — GUAIFENESIN-DM 100-10 MG/5ML PO SYRP
5.0000 mL | ORAL_SOLUTION | ORAL | Status: DC | PRN
  Administered 2019-05-14: 5 mL via ORAL
  Filled 2019-05-09: qty 10

## 2019-05-09 MED ORDER — ACETAMINOPHEN 325 MG PO TABS
650.0000 mg | ORAL_TABLET | Freq: Four times a day (QID) | ORAL | Status: DC | PRN
  Administered 2019-05-12 – 2019-05-14 (×4): 650 mg via ORAL
  Filled 2019-05-09 (×4): qty 2

## 2019-05-09 MED ORDER — CIPROFLOXACIN HCL 500 MG PO TABS: 500.0000 mg | ORAL_TABLET | Freq: Two times a day (BID) | ORAL | 0 refills | Status: DC

## 2019-05-09 MED ORDER — SODIUM CHLORIDE 0.9 % IV SOLN
500.0000 mg | INTRAVENOUS | Status: DC
  Administered 2019-05-09: 500 mg via INTRAVENOUS
  Filled 2019-05-09: qty 500

## 2019-05-09 MED ORDER — DILTIAZEM HCL 120 MG PO TABS: 120.0000 mg | ORAL_TABLET | Freq: Every day | ORAL | 1 refills | Status: AC

## 2019-05-09 MED ORDER — SIMVASTATIN 10 MG PO TABS: 10.0000 mg | ORAL_TABLET | Freq: Every day | ORAL | 1 refills | Status: AC

## 2019-05-09 MED ORDER — CLONAZEPAM 0.5 MG PO TABS
0.5000 mg | ORAL_TABLET | Freq: Three times a day (TID) | ORAL | Status: DC | PRN
  Administered 2019-05-10 – 2019-05-16 (×11): 0.5 mg via ORAL
  Filled 2019-05-09 (×11): qty 1

## 2019-05-09 MED ORDER — WARFARIN SODIUM 2 MG PO TABS: ORAL_TABLET | ORAL | 1 refills | Status: DC

## 2019-05-09 MED ORDER — SODIUM CHLORIDE 0.9% FLUSH: 3.0000 mL | Freq: Once | INTRAVENOUS | Status: DC

## 2019-05-09 MED ORDER — ALBUTEROL SULFATE HFA 108 (90 BASE) MCG/ACT IN AERS
2.0000 | INHALATION_SPRAY | Freq: Once | RESPIRATORY_TRACT | Status: AC
  Administered 2019-05-09: 2 via RESPIRATORY_TRACT
  Filled 2019-05-09: qty 6.7

## 2019-05-09 MED ORDER — SUCRALFATE 1 GM/10ML PO SUSP
1.0000 g | Freq: Three times a day (TID) | ORAL | Status: DC
  Administered 2019-05-09 – 2019-05-16 (×25): 1 g via ORAL
  Filled 2019-05-09 (×32): qty 10

## 2019-05-09 MED ORDER — ONDANSETRON HCL 4 MG PO TABS: 4.0000 mg | ORAL_TABLET | Freq: Four times a day (QID) | ORAL | Status: DC | PRN

## 2019-05-09 MED ORDER — HYDROCODONE-ACETAMINOPHEN 5-325 MG PO TABS
1.0000 | ORAL_TABLET | Freq: Four times a day (QID) | ORAL | Status: DC | PRN
  Administered 2019-05-10: 2 via ORAL
  Filled 2019-05-09: qty 2

## 2019-05-09 MED ORDER — SODIUM CHLORIDE 0.9 % IV SOLN
2.0000 g | Freq: Once | INTRAVENOUS | Status: AC
  Administered 2019-05-10: 2 g via INTRAVENOUS
  Filled 2019-05-09: qty 20

## 2019-05-09 MED ORDER — ONDANSETRON HCL 4 MG/2ML IJ SOLN: 4.0000 mg | Freq: Four times a day (QID) | INTRAMUSCULAR | Status: DC | PRN

## 2019-05-09 MED ORDER — PANTOPRAZOLE SODIUM 40 MG PO TBEC: 40.0000 mg | DELAYED_RELEASE_TABLET | Freq: Two times a day (BID) | ORAL | 1 refills | Status: DC

## 2019-05-09 MED ORDER — CLONAZEPAM 0.5 MG PO TABS: ORAL_TABLET | ORAL | 3 refills | Status: DC

## 2019-05-09 MED ORDER — POTASSIUM CHLORIDE CRYS ER 20 MEQ PO TBCR: 20.0000 meq | EXTENDED_RELEASE_TABLET | Freq: Every morning | ORAL | 1 refills | Status: DC

## 2019-05-09 MED ORDER — SITAGLIPTIN PHOSPHATE 50 MG PO TABS: ORAL_TABLET | ORAL | 1 refills | Status: AC

## 2019-05-09 NOTE — ED Provider Notes (Addendum)
[facility] EMERGENCY DEPARTMENT Provider Note   CSN: [id number] Arrival date & time: 05/09/19  1508     History   Chief Complaint Chief Complaint  Patient presents with  . Abdominal Pain    HPI [name] is a 84 y.o. female with history of COPD, ongoing tobacco use, renal insufficiency, diastolic heart failure with EF 55 to 60%, CAD, atrial fibrillation on warfarin, tachycardia-bradycardia syndrome with pacemaker, diabetes, recent hospitalization for COPD exacerbation, chronic respiratory failure on oxygen at home presents to the ER for evaluation of abdominal pain.  Reports this is been going on for months but it worsened today.  Located to the periumbilical, epigastric areas.  Sometimes it radiates into left chest and it feels like a heaviness but no chest pain currently.  Pain is described burning and like a fire.  It is worse with foods.  She takes Protonix and Tums but this is not helping.  Had a telemedicine visit with her PCP today and she referred her to GI.  She has an upcoming appointment with gastroenterology.  She denies any fever, nausea, vomiting, hiccups or belching, diarrhea, constipation, dysuria.  No melena. She does not take any ibuprofen or aspirin. She does not drink alcohol. She states she is always short of breath, not necessarily worse.  She is now on 3 L nasal cannula of oxygen at home at all times.  She has not had to increase her oxygen requirement.    HPI  Past Medical History:  Diagnosis Date  . Abnormal CT of the chest    Mediastinal and hilar adenopathy followed by Dr. Koleen Nimrod in [place]  . Anemia, unspecified   . Body mass index (BMI) of 20.0-20.9 in adult FEB 2013 147 LBS  . CAD (coronary artery disease) 2011   Catheterization August 2011: mild nonobstructive coronary artery disease complicated by large left rectus sheath hematoma status post evacuation Coumadin restarted without recurrence  . Carotid artery disease ([place])    Doppler, 05/2012,  0-39% bilateral  . Cataract   . Chronic airway obstruction, not elsewhere classified   . Chronic kidney disease, stage II (mild)   . Congestive heart failure, unspecified    EF now 60%, previous nonischemic cardiomyopathy  . Diabetes mellitus   . Dilated bile duct 10/19/2011  . Diverticulitis Dec 2012  . Gastritis    By EGD  . GERD (gastroesophageal reflux disease)   . Hypercholesterolemia   . Hypertension   . Mitral regurgitation 06/27/2013  . Neurogenic sleep apnea    Uses noctural oxygen prescribed by her pulmonologi  . Pacemaker    Single chamber [employer] Jude ACCENT [phone] SN 719 04/11/1957 10/31/2010  . Permanent atrial fibrillation    H/o tachybradycardia syndrome on Coumadin anticoagulation, has pacemaker.  . Recurrent UTI   . Rheumatic heart disease   . Tachycardia-bradycardia [facility])    s/p St. Jude single chamber PPM 10/2010   . Unspecified hypertensive kidney disease with chronic kidney disease stage I through stage IV, or unspecified(403.90)   . Valvular heart disease    a. H/o moderate mitral stenosis by cath 2011 but no mention of this on 10/2011 echo. b. 10/2011 echo: mod MR, mild AS, mild TR; EF>65%  . Vitamin D deficiency     Patient Active Problem List   Diagnosis Date Noted  . COPD exacerbation ([place]) 04/26/2019  . Hypokalemia 04/26/2019  . Supratherapeutic INR 04/26/2019  . At high risk for falls 02/13/2016  . CKD (chronic kidney disease), stage III ([place]) 07/02/2015  .  Hyperlipidemia with target LDL less than 100 12/31/2014  . Osteoporosis, post-menopausal 11/01/2013  . GERD (gastroesophageal reflux disease) 07/13/2013  . Aortic stenosis 06/27/2013  . Chronic diastolic CHF (congestive heart failure) ([place]) 06/27/2013  . Vitamin D deficiency   . Tobacco abuse 05/12/2012  . Abdominal wall hernia 10/19/2011  . Unspecified atrial fibrillation ([place])   . CAD (coronary artery disease)   . Pacemaker-St.Jude   . Chronic anticoagulation 12/03/2010  . Anxiety 12/03/2010   . Mitral stenosis 05/30/2010  . Macrocytic anemia 05/16/2010  . Type 2 diabetes mellitus with kidney complication, without long-term current use of insulin ([place]) 01/19/2009  . Essential hypertension 01/19/2009  . COPD GOLD O  01/19/2009  . HEART MURMUR, BENIGN 01/19/2009    Past Surgical History:  Procedure Laterality Date  . CHOLECYSTECTOMY     40+ years ago  . COLONOSCOPY  10/2011   Dyann Ruddle sigmoid to descending diverticulosis, internal hemorrhoids  . ESOPHAGOGASTRODUODENOSCOPY  10/2011   Fields-erosive gastritis, biopsy with no H. pylori, hiatal hernia, peptic duodenitis, no celiac disease, duodenal diverticulum, duodenal nodule  . EUS  11/16/11   Mishra-13 MM CBD, normal pancreatic duct, otherwise normal EUS  . Evacuation of retroperitoneal and left rectus sheath    . HEMATOMA EVACUATION     femoral  . HERNIA REPAIR     X3  . PILONIDAL CYST / SINUS EXCISION    . Single-chamber pacemaker implantation  3/12   by Dr Caryl Comes     OB History    Gravida  2   Para  2   Term  2   Preterm      AB      Living        SAB      TAB      Ectopic      Multiple      Live Births               Home Medications    Prior to Admission medications   Medication Sig Start Date End Date Taking? Authorizing Provider  budesonide-formoterol (SYMBICORT) 160-4.5 MCG/ACT inhaler Inhale 2 puffs into the lungs 2 (two) times daily. 01/13/19  Yes Martin, Mary-Margaret, FNP  clonazePAM (KLONOPIN) 0.5 MG tablet TAKE 1 TABLET 3 TIMES DAILY AS NEEDED FOR ANXIETY Patient taking differently: Take 0.5 mg by mouth 3 (three) times daily as needed for anxiety.  05/09/19  Yes Martin, Mary-Margaret, FNP  diltiazem (CARDIZEM) 120 MG tablet Take 1 tablet (120 mg total) by mouth daily. 05/09/19  Yes Hassell Done, Mary-Margaret, FNP  ferrous sulfate 325 (65 FE) MG tablet Take 1 tablet (325 mg total) by mouth daily with breakfast. 10/29/15  Yes Eckard, Tammy, PharmD  HYDROcodone-acetaminophen (NORCO)  7.5-325 MG tablet Take 1 tablet by mouth 3 (three) times daily as needed (pain).  02/17/16  Yes [provider]  ipratropium-albuterol (DUONEB) 0.5-2.5 (3) MG/3ML SOLN INHALE THE CONTENTS OF 1 VIAL VIA NEBULIZER EVERY 4 HOURS AS NEEDED Patient taking differently: Take 3 mLs by nebulization every 4 (four) hours as needed (for shortness of breath/wheezing).  04/27/19  Yes Kathie Dike, MD  lipase/protease/amylase (CREON) 36000 UNITS CPEP capsule TAKE 3 CAPSULES WITH MEALS  AND TAKE 1 CAPSULE WITH A SNACK AS DIRECTED Patient taking differently: Take by mouth 2 (two) times daily. TAKE 3 CAPSULES BY MOUTH TWICE DAILY 11/03/18  Yes Hassell Done, Mary-Margaret, FNP  Multiple Vitamins-Minerals (PRESERVISION AREDS 2) CAPS Take 1 capsule by mouth daily.    Yes [provider]  nitroGLYCERIN (NITROSTAT)  0.4 MG SL tablet DISSOLVE 1 TABLET UNDER THE TONGUE EVERY 5 MINUTES AS NEEDED FOR CHEST PAIN AS DIRECTED Patient taking differently: Place 0.4 mg under the tongue every 5 (five) minutes as needed for chest pain.  04/28/19  Yes Herminio Commons, MD  OXYGEN Inhale 2 L into the lungs at bedtime. 2lpm with sleep only    Yes [provider]  pantoprazole (PROTONIX) 40 MG tablet Take 1 tablet (40 mg total) by mouth 2 (two) times daily. 05/09/19  Yes Hassell Done, Mary-Margaret, FNP  potassium chloride SA (K-DUR) 20 MEQ tablet Take 1 tablet (20 mEq total) by mouth every morning. 05/09/19  Yes Hassell Done, Mary-Margaret, FNP  simvastatin (ZOCOR) 10 MG tablet Take 1 tablet (10 mg total) by mouth daily. 05/09/19  Yes Martin, Mary-Margaret, FNP  sitaGLIPtin (JANUVIA) 50 MG tablet TAKE 1 TABLET (50 MG TOTAL) BY MOUTH DAILY. Patient taking differently: Take 50 mg by mouth daily.  05/09/19  Yes Martin, Mary-Margaret, FNP  VENTOLIN HFA 108 (90 Base) MCG/ACT inhaler USE 2 PUFFS EVERY 6 HOURS AS NEEDED Patient taking differently: Inhale 2 puffs into the lungs every 6 (six) hours as needed for wheezing or shortness of  breath.  01/18/17  Yes Hassell Done, Mary-Margaret, FNP  warfarin (COUMADIN) 2 MG tablet TAKE 1/2  TABLET (1 MG TOTAL) DAILY AT 6 PM. Restart 9/21 if INR <3 Patient taking differently: Take 1 mg by mouth every evening. TAKE 1/2  TABLET (1 MG TOTAL) DAILY AT 6 PM. 05/09/19  Yes Hassell Done, Mary-Margaret, FNP  ciprofloxacin (CIPRO) 500 MG tablet Take 1 tablet (500 mg total) by mouth 2 (two) times daily. 05/09/19   [name], FNP  predniSONE (DELTASONE) 10 MG tablet Take 40mg  po daily for 2 days then 30mg  daily for 2 days then 20mg  daily for 2 days then 10mg  daily for 2 days then stop Patient not taking: Reported on 05/09/2019 04/27/19   Kathie Dike, MD    Family History Family History  Problem Relation Age of Onset  . Cancer Mother        cervical  . Stroke Mother   . Cancer Daughter 61       kidney, lung  . Heart disease Father   . Heart attack Father   . Ulcers Father   . Early death Sister 1       died at 82 months old  . Hypertension Brother   . Cancer Paternal Aunt        breast  . Hypertension Son   . Colon cancer Neg Hx     Social History Social History   Tobacco Use  . Smoking status: Current Every Day Smoker    Packs/day: 0.25    Years: 45.00    Pack years: 11.25    Types: Cigarettes    Start date: 02/05/1958  . Smokeless tobacco: Never Used  . Tobacco comment: smokes 5-6 cigarettes daily  Substance Use Topics  . Alcohol use: No    Alcohol/week: 0.0 standard drinks  . Drug use: No     Allergies   Macrobid [nitrofurantoin monohyd macro], Torsemide, and Tramadol   Review of Systems Review of Systems  Cardiovascular: Positive for chest pain (intermittent, none currently).  Gastrointestinal: Positive for abdominal pain.  Hematological: Bruises/bleeds easily.  All other systems reviewed and are negative.    Physical Exam Updated Vital Signs BP (!) 138/59   Pulse 71   Temp 98.2 F (36.8 C) (Oral)   Resp (!) 24   Ht 5\' 2"  (  1.575 m)   Wt 46.7 kg    SpO2 93%   BMI 18.84 kg/m   Physical Exam Vitals signs and nursing note reviewed.  Constitutional:      Appearance: She is well-developed.     Comments: Found asleep but easily arousable to verbal stimulus.  Chronically ill-appearing.  Frail.  HENT:     Head: Normocephalic and atraumatic.     Nose: Nose normal.     Mouth/Throat:     Comments: Edentulous.  Lips are dry.  Moist mucous membranes. Eyes:     Conjunctiva/sclera: Conjunctivae normal.  Neck:     Musculoskeletal: Normal range of motion.  Cardiovascular:     Rate and Rhythm: Normal rate and regular rhythm.  Pulmonary:     Effort: Pulmonary effort is normal.     Breath sounds: Wheezing and rhonchi present.     Comments: Coarse lung sounds and wheezing to the middle and lower lobes posteriorly L>R.  SPO2 89-92% on 3 L [place].  Speaking in 2-3 word sentences. Prolonged expiration.  Abdominal:     General: Bowel sounds are normal.     Palpations: Abdomen is soft.     Tenderness: There is no abdominal tenderness.     Hernia: A hernia is present.     Comments: Abdomen is soft.  Large ventral hernia noted that is easily reducible without any pain.  No tenderness throughout the abdomen.  Patient states it only bothers her when you press "deep" around the umbilicus.  Active bowel sounds to lower quadrants.  No suprapubic or CVA tenderness.  Negative Murphy's and McBurney's.  Musculoskeletal: Normal range of motion.  Skin:    General: Skin is warm and dry.     Capillary Refill: Capillary refill takes less than 2 seconds.  Neurological:     Mental Status: She is alert.     Comments: Appears tired, somewhat somnolent but able to converse state and provide good history.  Psychiatric:        Behavior: Behavior normal.      ED Treatments / Results  Labs (all labs ordered are listed, but only abnormal results are displayed) Labs Reviewed  COMPREHENSIVE METABOLIC PANEL - Abnormal; Notable for the following components:      Result  Value   Chloride 96 (*)    Glucose, Bld 111 (*)    BUN 28 (*)    Creatinine, Ser 1.19 (*)    Calcium 8.3 (*)    Total Protein 6.3 (*)    Albumin 2.8 (*)    GFR calc non Af Amer 42 (*)    GFR calc Af Amer 49 (*)    All other components within normal limits  CBC - Abnormal; Notable for the following components:   RBC 3.39 (*)    Hemoglobin 10.9 (*)    MCV 106.2 (*)    Platelets 123 (*)    All other components within normal limits  URINALYSIS, ROUTINE W REFLEX MICROSCOPIC - Abnormal; Notable for the following components:   APPearance CLOUDY (*)    Protein, ur 100 (*)    Bacteria, UA RARE (*)    All other components within normal limits  TROPONIN I (HIGH SENSITIVITY) - Abnormal; Notable for the following components:   Troponin I (High Sensitivity) 30 (*)    All other components within normal limits  SARS CORONAVIRUS 2 (HOSPITAL ORDER, [place] [facility])  LIPASE, BLOOD  TROPONIN I (HIGH SENSITIVITY)    EKG EKG Interpretation  Date/Time:  Tuesday May 09 2019 21:18:40 EDT Ventricular Rate:  73 PR Interval:    QRS Duration: 127 QT Interval:  462 QTC Calculation: 510 R Axis:   -78 Text Interpretation:  Atrial fibrillation Left bundle branch block Confirmed by Gerlene Fee [phone]) on 05/09/2019 9:33:15 PM   Radiology Dg Abdomen 1 View  Result Date: 05/09/2019 CLINICAL DATA:  84 year old female with acute abdominal pain and burning. EXAM: ABDOMEN - 1 VIEW COMPARISON:  08/25/2017 radiographs FINDINGS: Nondistended gas-filled loops of colon and small bowel are noted. No dilated bowel loops are present. Abdominal wall mesh markers are present. No suspicious calcifications are identified. No acute bony abnormalities are noted. Degenerative changes in the LOWER lumbar spine are again identified. IMPRESSION: Nonspecific nonobstructive bowel gas pattern. Electronically Signed   By: Margarette Canada M.D.   On: 05/09/2019 21:37    Procedures .Critical Care  Performed by: Kinnie Feil, PA-C Authorized by: Kinnie Feil, PA-C   Critical care provider statement:    Critical care time (minutes):  45   Critical care was necessary to treat or prevent imminent or life-threatening deterioration of the following conditions:  Respiratory failure   Critical care was time spent personally by me on the following activities:  Discussions with consultants, evaluation of patient's response to treatment, examination of patient, ordering and performing treatments and interventions, ordering and review of laboratory studies, ordering and review of radiographic studies, pulse oximetry, re-evaluation of patient's condition, obtaining history from patient or surrogate, review of old charts and development of treatment plan with patient or surrogate   I assumed direction of critical care for this patient from another provider in my specialty: no     (including critical care time)  Medications Ordered in ED Medications  sodium chloride flush (NS) 0.9 % injection 3 mL (has no administration in time range)  sucralfate (CARAFATE) 1 GM/10ML suspension 1 g (1 g Oral Given 05/09/19 2111)  albuterol (VENTOLIN HFA) 108 (90 Base) MCG/ACT inhaler 2 puff (2 puffs Inhalation Given 05/09/19 2214)     Initial Impression / Assessment and Plan / ED Course  I have reviewed the triage vital signs and the nursing notes.  Pertinent labs & imaging results that were available during my care of the patient were reviewed by me and considered in my medical decision making (see chart for details).  Clinical Course as of May 09 2227  Tue May 09, 2019  2108 Bacteria, UA(!): RARE [CG]  2108 Squamous Epithelial / LPF: 6-10 [CG]  2108 Triple Phosphate Crystal: PRESENT [CG]  2127 Creatinine(!): 1.19 [CG]  2127 GFR, Est Non African American(!): 42 [CG]  2158 Nonspecific nonobstructive bowel gas pattern.  DG Abdomen 1 View [CG]  2159 A fib with LBBB HR <100s  EKG 12-Lead [CG]  2159  Troponin I (High Sensitivity)(!): 30 [CG]    Clinical Course User Index [CG] Kinnie Feil, PA-C   EMR reviewed patient. Patient seen by gastroenterology August 2019 for common bile duct dilation s/p cholecystectomy.  She had an EUS which revealed CBD of 13 mm otherwise benign.  She has history of multiple abdominal surgeries.  Has reported indigestion with eating since 2019.    Symptoms are most suggestive of gastritis/GERD possibly PUD.  Given her age and multiple abdominal surgeries however she is at risk for an SBO, ileus.  She also has several risk factors for mesenteric ischemia and she is having postprandrial symptoms.  However, her exam today is very benign.  I cannot reproduce any  tenderness here.  No melena.  No urinary symptoms.  Her hernia is easily reducible and nontender and symptomatic hernia or complication is less likely.    She reports some discomfort in her left chest but none currently.   Concern about reliable history. She is somnolent. Lungs with rhonchi/wheezing, SpO2 89-92% on 3 L [place].   ER work-up reviewed shows stable anemia otherwise normal.  Rare bacteria but UA is contaminated and she has no urinary symptoms, we will send for culture.  LFTs normal.  Lipase normal.  No leukocytosis.  Given her age, cardiac history and radiation of discomfort in the chest we will add troponin, EKG, CXR, KUB to evaluate lungs and for obstructive pattern in the gut.    GI medicines given, will reassess.  2225: Pt has significantly improved abdominal pain. Re-evaluated patient. Spo2 89% on RA. She is speaking in 2-3 word sentences, lungs with rhonchi/wheezing. Trop 30, EKG without ischemia and pt has no CP. Last trops have been in the 15s.  HEART score is 6.   She has echo scheduled for tomorrow. Given age, HEART score, initially elevated troponin I think she will benefit from admission for CP, serial troponins. CXR pending. Shared with EDP.  Final Clinical Impressions(s) / ED  Diagnoses   Final diagnoses:  Elevated troponin    ED Discharge Orders    None       Arlean Hopping 05/09/19 2229    Maudie Flakes, MD 05/14/19 1034    Kinnie Feil, PA-C 05/28/19 1204    Maudie Flakes, MD 05/30/19 [phone]

## 2019-05-09 NOTE — Progress Notes (Signed)
Virtual Visit via telephone Note Due to COVID-19 pandemic this visit was conducted virtually. This visit type was conducted due to national recommendations for restrictions regarding the COVID-19 Pandemic (e.g. social distancing, sheltering in place) in an effort to limit this patient's exposure and mitigate transmission in our community. All issues noted in this document were discussed and addressed.  A physical exam was not performed with this format.  I connected with [name] on 05/09/19 at 10:15 by telephone and verified that I am speaking with the correct person using two identifiers. [name] is currently located at home and no one is currently with  her during visit. The provider, [name], FNP is located in their office at time of visit.  I discussed the limitations, risks, security and privacy concerns of performing an evaluation and management service by telephone and the availability of in person appointments. I also discussed with the patient that there may be a patient responsible charge related to this service. The patient expressed understanding and agreed to proceed.   History and Present Illness:   Chief Complaint: Medical Management of Chronic Issues    HPI:  1. Essential hypertension No c/o chest pain, sob or headache. She does not check blood pressure at ome anymore. BP Readings from Last 3 Encounters:  04/27/19 125/89  03/16/19 (!) 123/55  12/06/18 130/65     2. Chronic diastolic CHF (congestive heart failure) ([place]) She was in hospital over a week ago and cardiology saw her in hospital. No changes were made to current medications.  3. Coronary artery disease involving native coronary artery of native heart without angina pectoris denies chest pain.she is scheduled for echo cardiogram and carotid doppler study to,orrow.  4. Permanent atrial fibrillation ([place]) She is on coumadin and last INR was 6.0. she was off of coumadin for  several days it has been back on it for over a week now.  5. COPD GOLD O  She had recent flare up of COPD and was hoisputralized. She is still very weak but is not coughing as much.  6. Gastroesophageal reflux disease without esophagitis Is on protonix daily and seems to help with symptoms. But is c/o abdominal pain daily  7. Type 2 diabetes mellitus with stage 3 chronic kidney disease, without long-term current use of insulin (HCC) Her blood sugars have been running around   8. Anxiety Patient stays very anxious and is on klonopin daily.  9. At high risk for falls She has had no recent falls  10. Hyperlipidemia with target LDL less than 100 Does not watch diet and does no exercise at all.  11. Hypokalemia She denies any lower ext cramps  12. Chronic anticoagulation Again last INR was 6.0. need to be rechecked. She deneis any bleeding  13. Pacemaker-St[employer]Jude Had interrogated several days ago.  14. Tobacco abuse Still smokes daily    Outpatient Encounter Medications as of 05/09/2019  Medication Sig  . acetaminophen (TYLENOL) 500 MG tablet Take 1,000 mg by mouth every 6 (six) hours as needed for mild pain, moderate pain or fever.   . budesonide-formoterol (SYMBICORT) 160-4.5 MCG/ACT inhaler Inhale 2 puffs into the lungs 2 (two) times daily.  . clonazePAM (KLONOPIN) 0.5 MG tablet TAKE 1 TABLET 3 TIMES DAILY AS NEEDED FOR ANXIETY  . diltiazem (CARDIZEM) 120 MG tablet Take 1 tablet (120 mg total) by mouth daily.  . ferrous sulfate 325 (65 FE) MG tablet Take 1 tablet (325 mg total) by mouth daily with breakfast.  .  HYDROcodone-acetaminophen (NORCO) 7.5-325 MG tablet Take 1 tablet by mouth 3 (three) times daily as needed (pain).   [name] ipratropium-albuterol (DUONEB) 0.5-2.5 (3) MG/3ML SOLN INHALE THE CONTENTS OF 1 VIAL VIA NEBULIZER EVERY 4 HOURS AS NEEDED  . JANUVIA 50 MG tablet TAKE 1 TABLET (50 MG TOTAL) BY MOUTH DAILY.  [name] lipase/protease/amylase (CREON) 36000 UNITS CPEP capsule  TAKE 3 CAPSULES WITH MEALS  AND TAKE 1 CAPSULE WITH A SNACK AS DIRECTED  . Multiple Vitamins-Minerals (PRESERVISION AREDS 2) CAPS Take 1 capsule by mouth daily.   . nitroGLYCERIN (NITROSTAT) 0.4 MG SL tablet DISSOLVE 1 TABLET UNDER THE TONGUE EVERY 5 MINUTES AS NEEDED FOR CHEST PAIN AS DIRECTED  . OXYGEN Inhale 2 L into the lungs at bedtime. 2lpm with sleep only   . pantoprazole (PROTONIX) 40 MG tablet Take 1 tablet (40 mg total) by mouth 2 (two) times daily.  . potassium chloride SA (K-DUR) 20 MEQ tablet Take 1 tablet (20 mEq total) by mouth every morning.  . predniSONE (DELTASONE) 10 MG tablet Take 40mg  po daily for 2 days then 30mg  daily for 2 days then 20mg  daily for 2 days then 10mg  daily for 2 days then stop  . Probiotic Product ([place]) CAPS Take 1 capsule by mouth daily.  . simvastatin (ZOCOR) 10 MG tablet TAKE 1 TABLET EVERY DAY  . VENTOLIN HFA 108 (90 Base) MCG/ACT inhaler USE 2 PUFFS EVERY 6 HOURS AS NEEDED  . warfarin (COUMADIN) 2 MG tablet TAKE 1/2  TABLET (1 MG TOTAL) DAILY AT 6 PM. Restart 9/21 if INR <3     Past Surgical History:  Procedure Laterality Date  . CHOLECYSTECTOMY     40+ years ago  . COLONOSCOPY  10/2011   Dyann Ruddle sigmoid to descending diverticulosis, internal hemorrhoids  . ESOPHAGOGASTRODUODENOSCOPY  10/2011   Fields-erosive gastritis, biopsy with no H. pylori, hiatal hernia, peptic duodenitis, no celiac disease, duodenal diverticulum, duodenal nodule  . EUS  11/16/11   Mishra-13 MM CBD, normal pancreatic duct, otherwise normal EUS  . Evacuation of retroperitoneal and left rectus sheath    . HEMATOMA EVACUATION     femoral  . HERNIA REPAIR     X3  . PILONIDAL CYST / SINUS EXCISION    . Single-chamber pacemaker implantation  3/12   by Dr Caryl Comes    Family History  Problem Relation Age of Onset  . Cancer Mother        cervical  . Stroke Mother   . Cancer Daughter 57       kidney, lung  . Heart disease Father   . Heart attack Father    . Ulcers Father   . Early death Sister 1       died at 2 months old  . Hypertension Brother   . Cancer Paternal Aunt        breast  . Hypertension Son   . Colon cancer Neg Hx     New complaints: She is complaining of pain in the middle of her stomach. She has this pain often. mylanta helps some.  Left like red swollen and weeping Social history: Has family that stays with her  Controlled substance contract: will have signed at next face to face visit    Review of Systems  Constitutional: Positive for malaise/fatigue.  HENT: Negative.   Respiratory: Positive for cough, sputum production and shortness of breath.   Cardiovascular: Positive for leg swelling.  Gastrointestinal: Negative.   Musculoskeletal: Positive for back pain and myalgias.  Skin: Negative.   Neurological: Positive for weakness.  Psychiatric/Behavioral: The patient is nervous/anxious.   All other systems reviewed and are negative.    Observations/Objective: Alert and oriented- answers all questions appropriately mild distress Occasional cough during phone call   Assessment and Plan: [name] comes in today with chief complaint of Medical Management of Chronic Issues   Diagnosis and orders addressed:  1. Essential hypertension Low sodium diet  2. Chronic diastolic CHF (congestive heart failure) ([place]) Keep follow up with cardiology  3. Coronary artery disease involving native coronary artery of native heart without angina pectoris  4. Permanent atrial fibrillation (HCC) Needs INR - checking with home health  5. COPD GOLD O  Continue inhalers  6. Gastroesophageal reflux disease without esophagitis Avoid all spicy and fatty foods - Ambulatory referral to Gastroenterology - pantoprazole (PROTONIX) 40 MG tablet; Take 1 tablet (40 mg total) by mouth 2 (two) times daily.  Dispense: 180 tablet; Refill: 1  7. Type 2 diabetes mellitus with stage 3 chronic kidney disease, without long-term  current use of insulin (HCC) Continue to wtach carbs in diet  8. Anxiety she has been getting klonopin filed early for the last 3 months according to pharmacy. She thinks her granddaughter is stealing some of meds. I told her she needs to watch that because if she o=cintinues to ask for them early she will no longer be able to get refills from our office. - clonazePAM (KLONOPIN) 0.5 MG tablet; TAKE 1 TABLET 3 TIMES DAILY AS NEEDED FOR ANXIETY  Dispense: 90 tablet; Refill: 3  9. At high risk for falls Fall preventiuon  10. Hyperlipidemia with target LDL less than 100 Low fat diet - simvastatin (ZOCOR) 10 MG tablet; Take 1 tablet (10 mg total) by mouth daily.  Dispense: 90 tablet; Refill: 1  11. Hypokalemia - potassium chloride SA (K-DUR) 20 MEQ tablet; Take 1 tablet (20 mEq total) by mouth every morning.  Dispense: 90 tablet; Refill: 1  12. Chronic anticoagulation Needs INR- will have home health re draw today  13. Pacemaker-[employer]Jude - warfarin (COUMADIN) 2 MG tablet; TAKE 1/2  TABLET (1 MG TOTAL) DAILY AT 6 PM. Restart 9/21 if INR <3  Dispense: 90 tablet; Refill: 1  14. Tobacco abuse Smoking cessation encouraged  15. Acute on chronic diastolic CHF (congestive heart failure) (HCC) - diltiazem (CARDIZEM) 120 MG tablet; Take 1 tablet (120 mg total) by mouth daily.  Dispense: 90 tablet; Refill: 1  16. Type 2 diabetes mellitus with diabetic nephropathy, without long-term current use of insulin (HCC) - sitaGLIPtin (JANUVIA) 50 MG tablet; TAKE 1 TABLET (50 MG TOTAL) BY MOUTH DAILY.  Dispense: 90 tablet; Refill: 1  17. Cellulitis cipro 500 1 po bid for 10 days o refills Elevate when sitting   Previous lab results reviewed Health Maintenance reviewed Diet and exercise encouraged  Follow up plan: 1 month     I discussed the assessment and treatment plan with the patient. The patient was provided an opportunity to ask questions and all were answered. The patient agreed with the plan  and demonstrated an understanding of the instructions.   The patient was advised to call back or seek an in-person evaluation if the symptoms worsen or if the condition fails to improve as anticipated.  The above assessment and management plan was discussed with the patient. The patient verbalized understanding of and has agreed to the management plan. Patient is aware to call the clinic if symptoms persist or worsen. Patient is aware  when to return to the clinic for a follow-up visit. Patient educated on when it is appropriate to go to the emergency department.   Time call ended:  [id number]  I provided 22 minutes of non-face-to-face time during this encounter.    [name], FNP

## 2019-05-09 NOTE — ED Triage Notes (Signed)
Pt having abdominal burning. Has history of same. PCP stated she was referred to a GI doctor today.

## 2019-05-09 NOTE — H&P (Addendum)
History and Physical    [name] ZWC:[id number] DOB: 1935/05/27 DOA: 05/09/2019  PCP: [name], FNP   Patient coming from: Home   Chief Complaint: Burning discomfort in upper abdomen   HPI: [name] is a 84 y.o. female with medical history significant for coronary artery disease, atrial fibrillation on warfarin, chronic diastolic CHF, COPD with chronic hypoxic respiratory failure, chronic kidney disease stage III, type 2 diabetes mellitus, anxiety, now presenting to the emergency department for evaluation of burning discomfort in the upper abdomen.  Patient reports that the symptoms have been worsening over recent weeks, particularly bad today.  She describes this as indigestion.  She has been taking PPI for this, but continues to have symptoms and has been referred to GI by her PCP.  She reports chronic shortness of breath and cough, not sure if it has worsened much or not, reports chronic leg swelling that seems to be worse.  She has not noted any fevers or chills.  Denied chest pain.  Denies vomiting, denies melena, and denies hematochezia.  ED Course: Upon arrival to the ED, patient is found to be afebrile, saturating low 90s on her usual supplemental oxygen, tachypneic while at rest, and with stable blood pressure.  EKG features atrial fibrillation with chronic LBBB.  KUB with nonspecific bowel gas pattern.  Chest x-ray concerning for a new patchy opacity in the left suprahilar lung concerning for pneumonia, as well as increased bronchial thickening and interstitial opacities that could be infectious or edema.  Chemistry panel notable for creatinine 1.19, similar to priors.  CBC with stable macrocytic anemia and mild thrombocytopenia.  Troponin was elevated at 30, down to 29 when repeated.  Patient was treated with albuterol and Carafate in the emergency department, and then also given Rocephin and azithromycin.  Review of Systems:  All other systems reviewed and  apart from HPI, are negative.  Past Medical History:  Diagnosis Date  . Abnormal CT of the chest    Mediastinal and hilar adenopathy followed by Dr. Koleen Nimrod in [place]  . Anemia, unspecified   . Body mass index (BMI) of 20.0-20.9 in adult FEB 2013 147 LBS  . CAD (coronary artery disease) 2011   Catheterization August 2011: mild nonobstructive coronary artery disease complicated by large left rectus sheath hematoma status post evacuation Coumadin restarted without recurrence  . Carotid artery disease ([place])    Doppler, 05/2012, 0-39% bilateral  . Cataract   . Chronic airway obstruction, not elsewhere classified   . Chronic kidney disease, stage II (mild)   . Congestive heart failure, unspecified    EF now 60%, previous nonischemic cardiomyopathy  . Diabetes mellitus   . Dilated bile duct 10/19/2011  . Diverticulitis Dec 2012  . Gastritis    By EGD  . GERD (gastroesophageal reflux disease)   . Hypercholesterolemia   . Hypertension   . Mitral regurgitation 06/27/2013  . Neurogenic sleep apnea    Uses noctural oxygen prescribed by her pulmonologi  . Pacemaker    Single chamber [place]. Jude ACCENT [phone] SN 719 04/11/1959 10/31/2010  . Permanent atrial fibrillation    H/o tachybradycardia syndrome on Coumadin anticoagulation, has pacemaker.  . Recurrent UTI   . Rheumatic heart disease   . Tachycardia-bradycardia [facility])    s/p St. Jude single chamber PPM 10/2010   . Unspecified hypertensive kidney disease with chronic kidney disease stage I through stage IV, or unspecified(403.90)   . Valvular heart disease    a. H/o moderate mitral stenosis by cath  2011 but no mention of this on 10/2011 echo. b. 10/2011 echo: mod MR, mild AS, mild TR; EF>65%  . Vitamin D deficiency     Past Surgical History:  Procedure Laterality Date  . CHOLECYSTECTOMY     40+ years ago  . COLONOSCOPY  10/2011   Dyann Ruddle sigmoid to descending diverticulosis, internal hemorrhoids  . ESOPHAGOGASTRODUODENOSCOPY   10/2011   Fields-erosive gastritis, biopsy with no H. pylori, hiatal hernia, peptic duodenitis, no celiac disease, duodenal diverticulum, duodenal nodule  . EUS  11/16/11   Mishra-13 MM CBD, normal pancreatic duct, otherwise normal EUS  . Evacuation of retroperitoneal and left rectus sheath    . HEMATOMA EVACUATION     femoral  . HERNIA REPAIR     X3  . PILONIDAL CYST / SINUS EXCISION    . Single-chamber pacemaker implantation  3/12   by Dr Caryl Comes     reports that she has been smoking cigarettes. She started smoking about 61 years ago. She has a 11.25 pack-year smoking history. She has never used smokeless tobacco. She reports that she does not drink alcohol or use drugs.  Allergies  Allergen Reactions  . Macrobid [Nitrofurantoin Monohyd Macro] Rash  . Torsemide Nausea And Vomiting  . Tramadol Nausea Only    Family History  Problem Relation Age of Onset  . Cancer Mother        cervical  . Stroke Mother   . Cancer Daughter 42       kidney, lung  . Heart disease Father   . Heart attack Father   . Ulcers Father   . Early death Sister 1       died at 55 months old  . Hypertension Brother   . Cancer Paternal Aunt        breast  . Hypertension Son   . Colon cancer Neg Hx      Prior to Admission medications   Medication Sig Start Date End Date Taking? Authorizing Provider  budesonide-formoterol (SYMBICORT) 160-4.5 MCG/ACT inhaler Inhale 2 puffs into the lungs 2 (two) times daily. 01/13/19  Yes Martin, Mary-Margaret, FNP  clonazePAM (KLONOPIN) 0.5 MG tablet TAKE 1 TABLET 3 TIMES DAILY AS NEEDED FOR ANXIETY Patient taking differently: Take 0.5 mg by mouth 3 (three) times daily as needed for anxiety.  05/09/19  Yes Martin, Mary-Margaret, FNP  diltiazem (CARDIZEM) 120 MG tablet Take 1 tablet (120 mg total) by mouth daily. 05/09/19  Yes Hassell Done, Mary-Margaret, FNP  ferrous sulfate 325 (65 FE) MG tablet Take 1 tablet (325 mg total) by mouth daily with breakfast. 10/29/15  Yes Eckard, Tammy,  PharmD  HYDROcodone-acetaminophen (NORCO) 7.5-325 MG tablet Take 1 tablet by mouth 3 (three) times daily as needed (pain).  02/17/16  Yes [provider]  ipratropium-albuterol (DUONEB) 0.5-2.5 (3) MG/3ML SOLN INHALE THE CONTENTS OF 1 VIAL VIA NEBULIZER EVERY 4 HOURS AS NEEDED Patient taking differently: Take 3 mLs by nebulization every 4 (four) hours as needed (for shortness of breath/wheezing).  04/27/19  Yes Kathie Dike, MD  lipase/protease/amylase (CREON) 36000 UNITS CPEP capsule TAKE 3 CAPSULES WITH MEALS  AND TAKE 1 CAPSULE WITH A SNACK AS DIRECTED Patient taking differently: Take by mouth 2 (two) times daily. TAKE 3 CAPSULES BY MOUTH TWICE DAILY 11/03/18  Yes Hassell Done, Mary-Margaret, FNP  Multiple Vitamins-Minerals (PRESERVISION AREDS 2) CAPS Take 1 capsule by mouth daily.    Yes [provider]  nitroGLYCERIN (NITROSTAT) 0.4 MG SL tablet DISSOLVE 1 TABLET UNDER THE TONGUE EVERY 5 MINUTES AS  NEEDED FOR CHEST PAIN AS DIRECTED Patient taking differently: Place 0.4 mg under the tongue every 5 (five) minutes as needed for chest pain.  04/28/19  Yes Herminio Commons, MD  OXYGEN Inhale 2 L into the lungs at bedtime. 2lpm with sleep only    Yes [provider]  pantoprazole (PROTONIX) 40 MG tablet Take 1 tablet (40 mg total) by mouth 2 (two) times daily. 05/09/19  Yes Hassell Done, Mary-Margaret, FNP  potassium chloride SA (K-DUR) 20 MEQ tablet Take 1 tablet (20 mEq total) by mouth every morning. 05/09/19  Yes Hassell Done, Mary-Margaret, FNP  simvastatin (ZOCOR) 10 MG tablet Take 1 tablet (10 mg total) by mouth daily. 05/09/19  Yes Martin, Mary-Margaret, FNP  sitaGLIPtin (JANUVIA) 50 MG tablet TAKE 1 TABLET (50 MG TOTAL) BY MOUTH DAILY. Patient taking differently: Take 50 mg by mouth daily.  05/09/19  Yes Martin, Mary-Margaret, FNP  VENTOLIN HFA 108 (90 Base) MCG/ACT inhaler USE 2 PUFFS EVERY 6 HOURS AS NEEDED Patient taking differently: Inhale 2 puffs into the lungs every 6 (six) hours  as needed for wheezing or shortness of breath.  01/18/17  Yes Hassell Done, Mary-Margaret, FNP  warfarin (COUMADIN) 2 MG tablet TAKE 1/2  TABLET (1 MG TOTAL) DAILY AT 6 PM. Restart 9/21 if INR <3 Patient taking differently: Take 1 mg by mouth every evening. TAKE 1/2  TABLET (1 MG TOTAL) DAILY AT 6 PM. 05/09/19  Yes Hassell Done, Mary-Margaret, FNP  ciprofloxacin (CIPRO) 500 MG tablet Take 1 tablet (500 mg total) by mouth 2 (two) times daily. 05/09/19   [name], FNP    Physical Exam: Vitals:   05/09/19 2200 05/09/19 2230 05/09/19 2300 05/09/19 2330  BP: (!) 138/59 (!) 147/56 (!) 127/40 (!) 132/52  Pulse: 71 81 71 72  Resp: (!) 24 (!) 27 (!) 26 (!) 28  Temp:      TempSrc:      SpO2: 93% 93% 93% 93%  Weight:      Height:        Constitutional: frail-appearing, mild tachypnea at rest, no diaphoresis  Eyes: PERTLA, lids and conjunctivae normal ENMT: Mucous membranes are moist. Posterior pharynx clear of any exudate or lesions.   Neck: normal, supple, no masses, no thyromegaly Respiratory: Mild tachypnea at rest. Increased WOB. Rales bilaterally. No pallor or cyanosis.   Cardiovascular: Rate ~80 and irregularly irregular. Pretibial pitting edema bilaterally.   Abdomen: No distension, no tenderness, soft. Bowel sounds active.  Musculoskeletal: no clubbing / cyanosis. No joint deformity upper and lower extremities.   Skin: Erythema and induration involving lower left leg with some serous weeping. Warm, dry, well-perfused. Neurologic: No gross facial asymmetry. Sensation intact. Moving all extremities.  Psychiatric: Alert and oriented to person, place, and situation. Calm, cooperative.    Labs on Admission: I have personally reviewed following labs and imaging studies  CBC: Recent Labs  Lab 05/09/19 1736  WBC 7.9  HGB 10.9*  HCT 36.0  MCV 106.2*  PLT 295*   Basic Metabolic Panel: Recent Labs  Lab 05/09/19 1736  NA 136  K 3.6  CL 96*  CO2 32  GLUCOSE 111*  BUN 28*   CREATININE 1.19*  CALCIUM 8.3*   GFR: Estimated Creatinine Clearance: 26.9 mL/min (A) (by C-G formula based on SCr of 1.19 mg/dL (H)). Liver Function Tests: Recent Labs  Lab 05/09/19 1736  AST 32  ALT 27  ALKPHOS 78  BILITOT 0.6  PROT 6.3*  ALBUMIN 2.8*   Recent Labs  Lab 05/09/19 1736  LIPASE 21   No results for input(s): AMMONIA in the last 168 hours. Coagulation Profile: No results for input(s): INR, PROTIME in the last 168 hours. Cardiac Enzymes: No results for input(s): CKTOTAL, CKMB, CKMBINDEX, TROPONINI in the last 168 hours. BNP (last 3 results) No results for input(s): PROBNP in the last 8760 hours. HbA1C: No results for input(s): HGBA1C in the last 72 hours. CBG: No results for input(s): GLUCAP in the last 168 hours. Lipid Profile: No results for input(s): CHOL, HDL, LDLCALC, TRIG, CHOLHDL, LDLDIRECT in the last 72 hours. Thyroid Function Tests: No results for input(s): TSH, T4TOTAL, FREET4, T3FREE, THYROIDAB in the last 72 hours. Anemia Panel: No results for input(s): VITAMINB12, FOLATE, FERRITIN, TIBC, IRON, RETICCTPCT in the last 72 hours. Urine analysis:    Component Value Date/Time   COLORURINE YELLOW 05/09/2019 2045   APPEARANCEUR CLOUDY (A) 05/09/2019 2045   APPEARANCEUR Cloudy (A) 08/25/2017 1732   LABSPEC 1.018 05/09/2019 2045   PHURINE 7.0 05/09/2019 2045   GLUCOSEU NEGATIVE 05/09/2019 2045   HGBUR NEGATIVE 05/09/2019 2045   BILIRUBINUR NEGATIVE 05/09/2019 2045   BILIRUBINUR Negative 08/25/2017 [place] 05/09/2019 2045   PROTEINUR 100 (A) 05/09/2019 2045   UROBILINOGEN negative 02/28/2015 1602   UROBILINOGEN 0.2 08/21/2013 1922   NITRITE NEGATIVE 05/09/2019 2045   LEUKOCYTESUR NEGATIVE 05/09/2019 2045   Sepsis Labs: @LABRCNTIP (procalcitonin:4,lacticidven:4) )No results found for this or any previous visit (from the past 240 hour(s)).   Radiological Exams on Admission: Dg Abdomen 1 View  Result Date: 05/09/2019  CLINICAL DATA:  84 year old female with acute abdominal pain and burning. EXAM: ABDOMEN - 1 VIEW COMPARISON:  08/25/2017 radiographs FINDINGS: Nondistended gas-filled loops of colon and small bowel are noted. No dilated bowel loops are present. Abdominal wall mesh markers are present. No suspicious calcifications are identified. No acute bony abnormalities are noted. Degenerative changes in the LOWER lumbar spine are again identified. IMPRESSION: Nonspecific nonobstructive bowel gas pattern. Electronically Signed   By: Margarette Canada M.D.   On: 05/09/2019 21:37   Dg Chest Portable 1 View  Result Date: 05/09/2019 CLINICAL DATA:  Cough and wheezing. EXAM: PORTABLE CHEST 1 VIEW COMPARISON:  Chest radiograph 04/25/2019, CT 09/12/2018 FINDINGS: Left-sided pacemaker in place. Mild cardiomegaly with unchanged mediastinal contours. Increased bronchial thickening and interstitial opacities since prior exam. Suspected small pleural effusions, left greater than right. New patchy opacity in the left suprahilar lung. No pneumothorax. Chronic benign lesion in the right proximal humerus. IMPRESSION: 1. New patchy opacity in the left suprahilar lung, suspicious for pneumonia. Followup PA and lateral chest X-ray is recommended in 3-4 weeks following trial of antibiotic therapy to ensure resolution and exclude underlying malignancy. 2. Increased interstitial opacities and bronchial thickening since prior, infectious versus pulmonary edema. 3. Suspected small pleural effusions. Electronically Signed   By: Keith Rake M.D.   On: 05/09/2019 22:40    EKG: Independently reviewed. Atrial fibrillation, chronic LBBB.   Assessment/Plan   1. Pneumonia  - Presents with burning discomfort in upper abdomen, improved with Carafate in ED, but also noted to be dyspneic at rest in bed with frequent coughing  - CXR is concerning for PNA involving the left lung, and there is also diffuse patchy opacities that could reflect edema or  infection   - Continue airborne/contact precautions until COVID-19 testing has resulted, check procalcitonin, check strep pnuemo and legionella antigens, culture sputum, and continue Rocephin and azithromycin    ADDENDUM: COVID-19 is positive. Continue abx pending procalcitonin. Start Decadron, start  remdesivir with pharmacy assistance, trend inflammatory markers. Admit to [facility].    2. Acute on chronic diastolic CHF  - Appears hypervolemic on admission with marked peripheral edema and suspected edema on CXR  - SLIV, fluid-restrict diet, diurese with Lasix 40 mg IV q12h, update echocardiogram    3. COPD with chronic hypoxic respiratory failure  - Continue ICS/LABA, supplemental O2, and Duonebs     4. CAD; elevated troponin  - HS troponin was elevated to 30 in ED without chest pain, 29 when repeated  - There is chronic LBBB noted on EKG  - Continue statin   5. GERD  - Patient reports recent worsening in her chronic indigestion, referred to GI by her PCP  - Denies hematemesis or melena  - She was given Carafate in ED with reported improvement - Continue PPI    6. Atrial fibrillation  - In rate-controlled a fib on admission  - CHADS-VASc is 62 (age x2, gender, CAD, CHF, DM)  - Check INR, continue warfarin with pharmacy assistance, continue diltiazem    7. Anxiety  - Calm on admission - Continue as-needed Klonopin    8. Type II DM  - A1c was only 5.3% in September 2020  - Managed with Januvia at home, held on admission  - Check CBG's and use a low-intensity SSI with Novolog as needed while in hospital   9. Lower left leg cellulitis  - Non-purulent, Rocephin should cover   PPE: Mask, face shield  DVT prophylaxis: warfarin  Code Status: Full  Family Communication: Discussed with patient  Consults called: None   Admission status: Inpatient. Patient has pneumonia, and in light of her age and the severity of her comorbid conditions, she is at particularly high risk  for life-threatening complications as reflected by her high PORT/PSI scores, and she will require careful inpatient management.    Vianne Bulls, MD [facility] Pager [phone]  If 7PM-7AM, please contact night-coverage [email or web address] Password [facility]  05/09/2019, 11:57 PM

## 2019-05-10 ENCOUNTER — Inpatient Hospital Stay (HOSPITAL_COMMUNITY): Payer: Medicare HMO

## 2019-05-10 ENCOUNTER — Other Ambulatory Visit: Payer: Medicare HMO

## 2019-05-10 ENCOUNTER — Encounter (HOSPITAL_COMMUNITY): Payer: Self-pay | Admitting: Family Medicine

## 2019-05-10 DIAGNOSIS — U071 COVID-19: Secondary | ICD-10-CM | POA: Diagnosis present

## 2019-05-10 DIAGNOSIS — I351 Nonrheumatic aortic (valve) insufficiency: Secondary | ICD-10-CM

## 2019-05-10 DIAGNOSIS — I340 Nonrheumatic mitral (valve) insufficiency: Secondary | ICD-10-CM

## 2019-05-10 DIAGNOSIS — R778 Other specified abnormalities of plasma proteins: Secondary | ICD-10-CM | POA: Insufficient documentation

## 2019-05-10 LAB — C-REACTIVE PROTEIN
CRP: 16.1 mg/dL — ABNORMAL HIGH (ref <1.0)
CRP: 15.3 mg/dL — ABNORMAL HIGH (ref <1.0)

## 2019-05-10 LAB — CBC WITH DIFFERENTIAL/PLATELET
Abs Immature Granulocytes: 0.04 10*3/uL (ref 0.00–0.07)
Basophils Absolute: 0 10*3/uL (ref 0.0–0.1)
Basophils Relative: 0 %
Eosinophils Absolute: 0 10*3/uL (ref 0.0–0.5)
Eosinophils Relative: 0 %
HCT: 34.3 % — ABNORMAL LOW (ref 36.0–46.0)
Hemoglobin: 10.6 g/dL — ABNORMAL LOW (ref 12.0–15.0)
Immature Granulocytes: 1 %
Lymphocytes Relative: 5 %
Lymphs Abs: 0.3 10*3/uL — ABNORMAL LOW (ref 0.7–4.0)
MCH: 32.3 pg (ref 26.0–34.0)
MCHC: 30.9 g/dL (ref 30.0–36.0)
MCV: 104.6 fL — ABNORMAL HIGH (ref 80.0–100.0)
Monocytes Absolute: 0.1 10*3/uL (ref 0.1–1.0)
Monocytes Relative: 2 %
Neutro Abs: 6.4 10*3/uL (ref 1.7–7.7)
Neutrophils Relative %: 92 %
Platelets: 118 10*3/uL — ABNORMAL LOW (ref 150–400)
RBC: 3.28 MIL/uL — ABNORMAL LOW (ref 3.87–5.11)
RDW: 14.6 % (ref 11.5–15.5)
WBC: 6.9 10*3/uL (ref 4.0–10.5)
nRBC: 0 % (ref 0.0–0.2)

## 2019-05-10 LAB — PROTIME-INR
INR: 2.8 — ABNORMAL HIGH (ref 0.8–1.2)
INR: 3 — ABNORMAL HIGH (ref 0.8–1.2)
INR: 3 — ABNORMAL HIGH (ref 0.8–1.2)
Prothrombin Time: 28.7 seconds — ABNORMAL HIGH (ref 11.4–15.2)
Prothrombin Time: 30.7 seconds — ABNORMAL HIGH (ref 11.4–15.2)
Prothrombin Time: 30.7 seconds — ABNORMAL HIGH (ref 11.4–15.2)

## 2019-05-10 LAB — GLUCOSE, CAPILLARY
Glucose-Capillary: 129 mg/dL — ABNORMAL HIGH (ref 70–99)
Glucose-Capillary: 169 mg/dL — ABNORMAL HIGH (ref 70–99)

## 2019-05-10 LAB — FIBRINOGEN: Fibrinogen: 547 mg/dL — ABNORMAL HIGH (ref 210–475)

## 2019-05-10 LAB — SARS CORONAVIRUS 2 BY RT PCR (HOSPITAL ORDER, PERFORMED IN ~~LOC~~ HOSPITAL LAB): SARS Coronavirus 2: POSITIVE — AB

## 2019-05-10 LAB — STREP PNEUMONIAE URINARY ANTIGEN
Strep Pneumo Urinary Antigen: NEGATIVE
Strep Pneumo Urinary Antigen: NEGATIVE

## 2019-05-10 LAB — COMPREHENSIVE METABOLIC PANEL WITH GFR
ALT: 22 U/L (ref 0–44)
AST: 29 U/L (ref 15–41)
Albumin: 2.7 g/dL — ABNORMAL LOW (ref 3.5–5.0)
Alkaline Phosphatase: 79 U/L (ref 38–126)
Anion gap: 11 (ref 5–15)
BUN: 27 mg/dL — ABNORMAL HIGH (ref 8–23)
CO2: 32 mmol/L (ref 22–32)
Calcium: 8.6 mg/dL — ABNORMAL LOW (ref 8.9–10.3)
Chloride: 95 mmol/L — ABNORMAL LOW (ref 98–111)
Creatinine, Ser: 1.19 mg/dL — ABNORMAL HIGH (ref 0.44–1.00)
GFR calc Af Amer: 49 mL/min — ABNORMAL LOW
GFR calc non Af Amer: 42 mL/min — ABNORMAL LOW
Glucose, Bld: 120 mg/dL — ABNORMAL HIGH (ref 70–99)
Potassium: 3.4 mmol/L — ABNORMAL LOW (ref 3.5–5.1)
Sodium: 138 mmol/L (ref 135–145)
Total Bilirubin: 0.7 mg/dL (ref 0.3–1.2)
Total Protein: 6.2 g/dL — ABNORMAL LOW (ref 6.5–8.1)

## 2019-05-10 LAB — TYPE AND SCREEN
ABO/RH(D): A POS
Antibody Screen: NEGATIVE

## 2019-05-10 LAB — PROCALCITONIN
Procalcitonin: 0.16 ng/mL
Procalcitonin: 0.17 ng/mL

## 2019-05-10 LAB — ECHOCARDIOGRAM LIMITED
Height: 63 in
Weight: 1657.86 [oz_av]

## 2019-05-10 LAB — TROPONIN I (HIGH SENSITIVITY): Troponin I (High Sensitivity): 29 ng/L — ABNORMAL HIGH (ref <18)

## 2019-05-10 LAB — D-DIMER, QUANTITATIVE
D-Dimer, Quant: 1.41 ug/mL-FEU — ABNORMAL HIGH (ref 0.00–0.50)
D-Dimer, Quant: 2 ug{FEU}/mL — ABNORMAL HIGH (ref 0.00–0.50)

## 2019-05-10 LAB — FERRITIN
Ferritin: 1230 ng/mL — ABNORMAL HIGH (ref 11–307)
Ferritin: 1341 ng/mL — ABNORMAL HIGH (ref 11–307)

## 2019-05-10 LAB — LACTATE DEHYDROGENASE: LDH: 278 U/L — ABNORMAL HIGH (ref 98–192)

## 2019-05-10 LAB — MAGNESIUM: Magnesium: 2.8 mg/dL — ABNORMAL HIGH (ref 1.7–2.4)

## 2019-05-10 LAB — ABO/RH: ABO/RH(D): A POS

## 2019-05-10 LAB — BRAIN NATRIURETIC PEPTIDE
B Natriuretic Peptide: 440 pg/mL — ABNORMAL HIGH (ref 0.0–100.0)
B Natriuretic Peptide: 651.8 pg/mL — ABNORMAL HIGH (ref 0.0–100.0)

## 2019-05-10 MED ORDER — PANCRELIPASE (LIP-PROT-AMYL) 12000-38000 UNITS PO CPEP
12000.0000 [IU] | ORAL_CAPSULE | ORAL | Status: DC | PRN
  Filled 2019-05-10: qty 1

## 2019-05-10 MED ORDER — SODIUM CHLORIDE 0.9% FLUSH
3.0000 mL | Freq: Two times a day (BID) | INTRAVENOUS | Status: DC
  Administered 2019-05-10: 3 mL via INTRAVENOUS

## 2019-05-10 MED ORDER — SODIUM CHLORIDE 0.9 % IV SOLN
100.0000 mg | INTRAVENOUS | Status: AC
  Administered 2019-05-11 – 2019-05-14 (×4): 100 mg via INTRAVENOUS
  Filled 2019-05-10 (×4): qty 20

## 2019-05-10 MED ORDER — WARFARIN SODIUM 1 MG PO TABS
ORAL_TABLET | ORAL | Status: AC
  Filled 2019-05-10: qty 1

## 2019-05-10 MED ORDER — SENNOSIDES-DOCUSATE SODIUM 8.6-50 MG PO TABS
1.0000 | ORAL_TABLET | Freq: Every evening | ORAL | Status: DC | PRN
  Filled 2019-05-10: qty 1

## 2019-05-10 MED ORDER — SPIRONOLACTONE 25 MG PO TABS
25.0000 mg | ORAL_TABLET | Freq: Every day | ORAL | Status: DC
  Administered 2019-05-10 – 2019-05-12 (×3): 25 mg via ORAL
  Filled 2019-05-10 (×3): qty 1

## 2019-05-10 MED ORDER — WARFARIN - PHARMACIST DOSING INPATIENT: Freq: Every day | Status: DC

## 2019-05-10 MED ORDER — MOMETASONE FURO-FORMOTEROL FUM 200-5 MCG/ACT IN AERO
2.0000 | INHALATION_SPRAY | Freq: Two times a day (BID) | RESPIRATORY_TRACT | Status: DC
  Administered 2019-05-10 – 2019-05-16 (×13): 2 via RESPIRATORY_TRACT
  Filled 2019-05-10: qty 8.8

## 2019-05-10 MED ORDER — WARFARIN - PHARMACIST DOSING INPATIENT
Freq: Every day | Status: DC
  Administered 2019-05-12 – 2019-05-14 (×2)

## 2019-05-10 MED ORDER — SIMVASTATIN 20 MG PO TABS
10.0000 mg | ORAL_TABLET | Freq: Every day | ORAL | Status: DC
  Administered 2019-05-10 – 2019-05-15 (×6): 10 mg via ORAL
  Filled 2019-05-10 (×5): qty 1

## 2019-05-10 MED ORDER — WARFARIN SODIUM 1 MG PO TABS
1.0000 mg | ORAL_TABLET | Freq: Once | ORAL | Status: AC
  Administered 2019-05-10: 1 mg via ORAL
  Filled 2019-05-10: qty 1

## 2019-05-10 MED ORDER — POTASSIUM CHLORIDE CRYS ER 20 MEQ PO TBCR
20.0000 meq | EXTENDED_RELEASE_TABLET | Freq: Every morning | ORAL | Status: DC
  Administered 2019-05-10: 20 meq via ORAL
  Filled 2019-05-10: qty 1

## 2019-05-10 MED ORDER — WARFARIN SODIUM 1 MG PO TABS
1.0000 mg | ORAL_TABLET | Freq: Once | ORAL | Status: DC
  Filled 2019-05-10: qty 1

## 2019-05-10 MED ORDER — SODIUM CHLORIDE 0.9 % IV SOLN: 250.0000 mL | INTRAVENOUS | Status: DC | PRN

## 2019-05-10 MED ORDER — WARFARIN 0.5 MG HALF TABLET
0.5000 mg | ORAL_TABLET | Freq: Once | ORAL | Status: AC
  Administered 2019-05-10: 0.5 mg via ORAL
  Filled 2019-05-10 (×2): qty 1

## 2019-05-10 MED ORDER — PANTOPRAZOLE SODIUM 40 MG PO TBEC
40.0000 mg | DELAYED_RELEASE_TABLET | Freq: Two times a day (BID) | ORAL | Status: DC
  Administered 2019-05-10 – 2019-05-16 (×13): 40 mg via ORAL
  Filled 2019-05-10 (×12): qty 1

## 2019-05-10 MED ORDER — PNEUMOCOCCAL VAC POLYVALENT 25 MCG/0.5ML IJ INJ
0.5000 mL | INJECTION | INTRAMUSCULAR | Status: DC
  Filled 2019-05-10: qty 0.5

## 2019-05-10 MED ORDER — DILTIAZEM HCL ER COATED BEADS 120 MG PO CP24
120.0000 mg | ORAL_CAPSULE | Freq: Every day | ORAL | Status: DC
  Administered 2019-05-10 – 2019-05-16 (×7): 120 mg via ORAL
  Filled 2019-05-10 (×7): qty 1

## 2019-05-10 MED ORDER — SODIUM CHLORIDE 0.9 % IV SOLN: 2.0000 g | INTRAVENOUS | Status: DC

## 2019-05-10 MED ORDER — IPRATROPIUM-ALBUTEROL 0.5-2.5 (3) MG/3ML IN SOLN: 3.0000 mL | RESPIRATORY_TRACT | Status: DC | PRN

## 2019-05-10 MED ORDER — INSULIN ASPART 100 UNIT/ML ~~LOC~~ SOLN
0.0000 [IU] | Freq: Three times a day (TID) | SUBCUTANEOUS | Status: DC
  Administered 2019-05-10 (×2): 2 [IU] via SUBCUTANEOUS
  Administered 2019-05-10: 1 [IU] via SUBCUTANEOUS
  Administered 2019-05-11 (×2): 2 [IU] via SUBCUTANEOUS
  Administered 2019-05-11: 3 [IU] via SUBCUTANEOUS
  Administered 2019-05-12 (×2): 2 [IU] via SUBCUTANEOUS
  Administered 2019-05-12: 09:00:00 1 [IU] via SUBCUTANEOUS
  Administered 2019-05-13 – 2019-05-14 (×3): 2 [IU] via SUBCUTANEOUS
  Administered 2019-05-14: 1 [IU] via SUBCUTANEOUS
  Administered 2019-05-15: 09:00:00 2 [IU] via SUBCUTANEOUS
  Administered 2019-05-15 – 2019-05-16 (×2): 1 [IU] via SUBCUTANEOUS

## 2019-05-10 MED ORDER — WARFARIN 0.5 MG HALF TABLET
0.5000 mg | ORAL_TABLET | Freq: Once | ORAL | Status: DC
  Filled 2019-05-10: qty 1

## 2019-05-10 MED ORDER — DILTIAZEM HCL 60 MG PO TABS
120.0000 mg | ORAL_TABLET | Freq: Every day | ORAL | Status: DC
  Filled 2019-05-10: qty 2

## 2019-05-10 MED ORDER — SODIUM CHLORIDE 0.9% FLUSH: 3.0000 mL | INTRAVENOUS | Status: DC | PRN

## 2019-05-10 MED ORDER — FUROSEMIDE 10 MG/ML IJ SOLN
40.0000 mg | Freq: Two times a day (BID) | INTRAMUSCULAR | Status: DC
  Administered 2019-05-10 – 2019-05-11 (×5): 40 mg via INTRAVENOUS
  Filled 2019-05-10 (×5): qty 4

## 2019-05-10 MED ORDER — PANCRELIPASE (LIP-PROT-AMYL) 36000-114000 UNITS PO CPEP
36000.0000 [IU] | ORAL_CAPSULE | Freq: Three times a day (TID) | ORAL | Status: DC
  Administered 2019-05-10 – 2019-05-16 (×19): 36000 [IU] via ORAL
  Filled 2019-05-10 (×24): qty 1

## 2019-05-10 MED ORDER — PANCRELIPASE (LIP-PROT-AMYL) 36000-114000 UNITS PO CPEP
108000.0000 [IU] | ORAL_CAPSULE | Freq: Three times a day (TID) | ORAL | Status: DC
  Filled 2019-05-10 (×3): qty 3

## 2019-05-10 MED ORDER — HYDROCODONE-ACETAMINOPHEN 5-325 MG PO TABS
1.0000 | ORAL_TABLET | Freq: Four times a day (QID) | ORAL | Status: DC | PRN
  Administered 2019-05-10 – 2019-05-16 (×15): 1 via ORAL
  Filled 2019-05-10 (×15): qty 1

## 2019-05-10 MED ORDER — DEXAMETHASONE SODIUM PHOSPHATE 10 MG/ML IJ SOLN
6.0000 mg | INTRAMUSCULAR | Status: DC
  Administered 2019-05-10 (×2): 6 mg via INTRAVENOUS
  Filled 2019-05-10 (×2): qty 1

## 2019-05-10 MED ORDER — POTASSIUM CHLORIDE CRYS ER 20 MEQ PO TBCR
40.0000 meq | EXTENDED_RELEASE_TABLET | Freq: Every day | ORAL | Status: DC
  Administered 2019-05-10: 14:00:00 40 meq via ORAL
  Filled 2019-05-10: qty 2

## 2019-05-10 MED ORDER — SODIUM CHLORIDE 0.9 % IV SOLN
200.0000 mg | Freq: Once | INTRAVENOUS | Status: AC
  Administered 2019-05-10: 200 mg via INTRAVENOUS
  Filled 2019-05-10: qty 40

## 2019-05-10 NOTE — Progress Notes (Signed)
  Echocardiogram 2D Echocardiogram has been performed.  [name] 05/10/2019, 9:16 AM

## 2019-05-10 NOTE — Plan of Care (Signed)
Pt will remain free from falls and injury and stable VS throughout shift

## 2019-05-10 NOTE — Progress Notes (Signed)
[place] for warfarin Indication: atrial fibrillation  Allergies  Allergen Reactions  . Macrobid [Nitrofurantoin Monohyd Macro] Rash  . Torsemide Nausea And Vomiting  . Tramadol Nausea Only    Patient Measurements: Height: 5\' 3"  (160 cm) Weight: 103 lb 9.9 oz (47 kg) IBW/kg (Calculated) : 52.4 Heparin Dosing Weight: 46.7 kg  Vital Signs: Temp: 98 F (36.7 C) (09/30 0535) Temp Source: Oral (09/30 0535) BP: 128/66 (09/30 0535) Pulse Rate: 78 (09/30 0535)  Labs: Recent Labs    05/09/19 1736 05/09/19 2109 05/09/19 2240 05/09/19 2315 05/10/19 0420  HGB 10.9*  --   --   --  10.6*  HCT 36.0  --   --   --  34.3*  PLT 123*  --   --   --  118*  LABPROT  --   --   --  28.7* 30.7*  INR  --   --   --  2.8* 3.0*  CREATININE 1.19*  --   --   --   --   TROPONINIHS  --  30* 29*  --   --     Estimated Creatinine Clearance: 27 mL/min (A) (by C-G formula based on SCr of 1.19 mg/dL (H)).   Assessment: [name] is a 84 y.o. female with medical history significant for CAD, afib on warfarin, chronic diastolic CHF, COPD with chronic hypoxic respiratory failure, CKD stage III, type 2 DM. Pharmacy consulted to dose warfarin while inpatient. Last dose reported taken on 9/28.  Baseline INR is therapeutic at 2.8 (Goal 2.5-3.5). Recently admitted with elevated INR up to 7.3. Additionally, azithromycin has been started.  PTA dose: warfarin 1 mg PO daily  Today, INR 3 (therapeutic). Remains on azithromycin which may potentiate effects of warfarin  Goal of Therapy:  INR 2.5-3.5 Monitor platelets by anticoagulation protocol: Yes   Plan:  Give warfarin 0.5 mg po x 1 Monitor daily INR, CBC, clinical course, s/sx of bleed, PO intake, DDI   Thank you for allowing [place] to participate in this patients care.  Sherlon Handing, PharmD, BCPS CGV Clinical pharmacist phone [phone] 05/10/2019 5:56 AM

## 2019-05-10 NOTE — Progress Notes (Signed)
PROGRESS NOTE                                                                                                                                                                                                             Patient Demographics:    [name], is a 84 y.o. female, DOB - [birth year], HRC:[id number]  Outpatient Primary MD for the patient is [name], FNP    LOS - 1  Admit date - 05/09/2019    Chief Complaint  Patient presents with   Abdominal Pain       Brief Narrative  [name] is a 84 y.o. female with medical history significant for coronary artery disease, chronic atrial fibrillation on warfarin, chronic diastolic CHF, COPD with chronic hypoxic respiratory failure, chronic kidney disease stage III, type 2 diabetes mellitus, anxiety, now presenting to the emergency department for evaluation of burning discomfort in the upper abdomen.  In the ER she was diagnosed with COVID-19 pneumonitis, acute on chronic diastolic CHF and admitted to the hospital.   Subjective:    [name] today has, No headache, No chest pain, No abdominal pain - No Nausea, No new weakness tingling or numbness, improved Cough & SOB.     Assessment  & Plan :     1. Acute Hypoxic Resp. Failure due to Acute Covid 19 Viral Pneumonitis during the ongoing 2020 Covid 19 Pandemic - her disease is quite mild, inflammatory markers however are elevated, currently placed on Remdisvir along with Decadron and clinically seems to be doing better.  Will be closely monitored especially her inflammatory markers and clinically.  She has been requested to sit up in chair and use flutter valve and I-S for pulmonary toiletry, she has been counseled to quit smoking, will monitor her closely on these medications.    COVID-19 Labs  Recent Labs    05/09/19 2240 05/10/19 0808  DDIMER 1.41* 2.00*  FERRITIN 1,230* 1,341*  LDH  278*  --   CRP 16.1* 15.3*    Lab Results  Component Value Date   SARSCOV2NAA POSITIVE (A) 05/09/2019   [place] NEGATIVE 04/25/2019   [place] Not Detected 12/29/2018     Hepatic Function Latest Ref Rng & Units 05/10/2019 05/09/2019 04/25/2019  Total Protein 6.5 - 8.1 g/dL 6.2(L) 6.3(L) 6.9  Albumin 3.5 - 5.0 g/dL 2.7(L) 2.8(L) 3.1(L)  AST 15 - 41 U/L 29 32 31  ALT 0 - 44 U/L 22 27 19   Alk Phosphatase 38 - 126 U/L 79 78 80  Total Bilirubin 0.3 - 1.2 mg/dL 0.7 0.6 0.5  Bilirubin, Direct 0.0 - 0.3 mg/dL - - -        Component Value Date/Time   BNP 651.8 (H) 05/10/2019 0808      2.  Acute on chronic diastolic CHF last EF 39% on echocardiogram 2 years ago.  Recent echo from this admission is pending, continue diuresis with Lasix and Aldactone, fluid and salt restriction, will monitor and adjust electrolytes and diuretics as needed.  3.  Chronic atrial fibrillation [place] vas 2 score of at least 3.  On diltiazem and Coumadin pharmacy monitoring INR.  Goal rate control.  4.  With underlying chronic hypoxic respiratory failure.  Continue supportive care with nebulizer treatments, uses 2 L nasal cannula oxygen at home at all times continue.  5.  CAD with chronic left bundle branch block.  Chest pain-free no acute issues mildly elevated troponin likely due to hypoxic respiratory failure from reasons #1 and 2 above.  Troponin trend is flat and and non-ACS pattern, continue combination of statin, Cardizem and Coumadin.  Outpatient cardiology follow-up.  No acute issues.  6.  Chronic anxiety and depression.  Home dose Klonopin and other medications will be continued.  7.  Smoking.  Counseled to quit.  8.  GERD and initial GI complaints.  After receiving Carafate in the ER the symptoms have resolved, continue PPI and Carafate combination here.  Outpatient GI follow-up if needed.  9.  Left lower leg cellulitis and wound which is chronic.  Under bandage.  Wound care consult.  No signs of  active infection.  10.  DM type II.  Recent A1c was 5.3 suggesting stable control.  Currently on sliding scale.  Uses Januvia at home.  CBG (last 3)  Recent Labs    05/10/19 0808  GLUCAP 129*       Condition - Extremely Guarded  Family Communication  :  None  Code Status :  Full  Diet :   Diet Order            Diet heart healthy/carb modified Room service appropriate? Yes; Fluid consistency: Thin; Fluid restriction: 1500 mL Fluid  Diet effective now               Disposition Plan  : TBD  Consults  :    Procedures  :    TTE  PUD Prophylaxis :  PPI  DVT Prophylaxis  :  Coumadin  Lab Results  Component Value Date   INR 3.0 (H) 05/10/2019   INR 3.0 (H) 05/10/2019   INR 2.8 (H) 05/09/2019     Lab Results  Component Value Date   PLT 118 (L) 05/10/2019    Inpatient Medications  Scheduled Meds:  dexamethasone (DECADRON) injection  6 mg Intravenous Q24H   diltiazem  120 mg Oral Daily   furosemide  40 mg Intravenous Q12H   insulin aspart  0-9 Units Subcutaneous TID WC   lipase/protease/amylase  36,000 Units Oral TID WC   mometasone-formoterol  2 puff Inhalation BID   pantoprazole  40 mg Oral BID   potassium chloride  40 mEq Oral Daily   simvastatin  10 mg Oral q1800   sodium chloride flush  3 mL Intravenous Once   spironolactone  25 mg Oral Daily   sucralfate  1 g Oral TID  WC & HS   warfarin  0.5 mg Oral ONCE-1800   Warfarin - Pharmacist Dosing Inpatient   Does not apply q1800   Continuous Infusions:  [START ON 05/11/2019] remdesivir 100 mg in NS 250 mL     PRN Meds:.acetaminophen, clonazePAM, guaiFENesin-dextromethorphan, HYDROcodone-acetaminophen, ipratropium-albuterol, lipase/protease/amylase, ondansetron **OR** ondansetron (ZOFRAN) IV, senna-docusate  Antibiotics  :    Anti-infectives (From admission, onward)   Start     Dose/Rate Route Frequency Ordered Stop   05/11/19 1000  remdesivir 100 mg in sodium chloride 0.9 % 250 mL  IVPB     100 mg 500 mL/hr over 30 Minutes Intravenous Every 24 hours 05/10/19 0540 05/15/19 0959   05/10/19 2200  cefTRIAXone (ROCEPHIN) 2 g in sodium chloride 0.9 % 100 mL IVPB  Status:  Discontinued     2 g 200 mL/hr over 30 Minutes Intravenous Every 24 hours 05/10/19 0011 05/10/19 1114   05/10/19 0630  remdesivir 200 mg in sodium chloride 0.9 % 250 mL IVPB     200 mg 500 mL/hr over 30 Minutes Intravenous Once 05/10/19 0540 05/10/19 1142   05/09/19 2245  cefTRIAXone (ROCEPHIN) 2 g in sodium chloride 0.9 % 100 mL IVPB     2 g 200 mL/hr over 30 Minutes Intravenous  Once 05/09/19 2244 05/10/19 0337   05/09/19 2245  azithromycin (ZITHROMAX) 500 mg in sodium chloride 0.9 % 250 mL IVPB  Status:  Discontinued     500 mg 250 mL/hr over 60 Minutes Intravenous Every 24 hours 05/09/19 2244 05/10/19 1114       Time Spent in minutes  30   Lala Lund M.D on 05/10/2019 at 12:07 PM  To page go to [email or web address] - password [facility]  [facility] -  Office  [phone]    See all Orders from today for further details    Objective:   Vitals:   05/10/19 0805 05/10/19 1100 05/10/19 1141 05/10/19 1204  BP: 119/60 132/68 125/69 126/67  Pulse: 80 81  75  Resp: (!) 22   (!) 22  Temp: 97.6 F (36.4 C)   97.8 F (36.6 C)  TempSrc: Axillary   Axillary  SpO2: 92% 97%  91%  Weight: 47 kg     Height:        Wt Readings from Last 3 Encounters:  05/10/19 47 kg  04/27/19 49.1 kg  03/16/19 47.6 kg     Intake/Output Summary (Last 24 hours) at 05/10/2019 1207 Last data filed at 05/10/2019 0337 Gross per 24 hour  Intake 350 ml  Output --  Net 350 ml     Physical Exam  Awake Alert, Oriented X 3, No new F.N deficits, Normal affect [place].AT,PERRAL Supple Neck,No JVD, No cervical lymphadenopathy appriciated.  Symmetrical Chest wall movement, Good air movement bilaterally, CTAB RRR,No Gallops,Rubs or new Murmurs, No Parasternal Heave +ve B.Sounds, Abd Soft, No tenderness, No organomegaly  appriciated, No rebound - guarding or rigidity. No Cyanosis, she has 1+ edema and a chronic wound under bandage in the left leg    Data Review:    CBC Recent Labs  Lab 05/09/19 1736 05/10/19 0420  WBC 7.9 6.9  HGB 10.9* 10.6*  HCT 36.0 34.3*  PLT 123* 118*  MCV 106.2* 104.6*  MCH 32.2 32.3  MCHC 30.3 30.9  RDW 14.6 14.6  LYMPHSABS  --  0.3*  MONOABS  --  0.1  EOSABS  --  0.0  BASOSABS  --  0.0    Chemistries  Recent Labs  Lab 05/09/19  1736 05/10/19 0808  NA 136 138  K 3.6 3.4*  CL 96* 95*  CO2 32 32  GLUCOSE 111* 120*  BUN 28* 27*  CREATININE 1.19* 1.19*  CALCIUM 8.3* 8.6*  MG  --  2.8*  AST 32 29  ALT 27 22  ALKPHOS 78 79  BILITOT 0.6 0.7   ------------------------------------------------------------------------------------------------------------------ No results for input(s): CHOL, HDL, LDLCALC, TRIG, CHOLHDL, LDLDIRECT in the last 72 hours.  Lab Results  Component Value Date   HGBA1C 5.3 04/26/2019   ------------------------------------------------------------------------------------------------------------------ No results for input(s): TSH, T4TOTAL, T3FREE, THYROIDAB in the last 72 hours.  Invalid input(s): FREET3  Cardiac Enzymes No results for input(s): CKMB, TROPONINI, MYOGLOBIN in the last 168 hours.  Invalid input(s): CK ------------------------------------------------------------------------------------------------------------------    Component Value Date/Time   BNP 651.8 (H) 05/10/2019 3845    Micro Results Recent Results (from the past 240 hour(s))  SARS Coronavirus 2 [facility] order, Performed in [facility] hospital lab) Nasopharyngeal Nasopharyngeal Swab     Status: Abnormal   Collection Time: 05/09/19 10:22 PM   Specimen: Nasopharyngeal Swab  Result Value Ref Range Status   SARS Coronavirus 2 POSITIVE (A) NEGATIVE Final    Comment: RESULT CALLED TO, READ BACK BY AND VERIFIED WITH: M DOSS,RN @0020  05/10/19 MKELLY (NOTE) If  result is NEGATIVE SARS-CoV-2 target nucleic acids are NOT DETECTED. The SARS-CoV-2 RNA is generally detectable in upper and lower  respiratory specimens during the acute phase of infection. The lowest  concentration of SARS-CoV-2 viral copies this assay can detect is 250  copies / mL. A negative result does not preclude SARS-CoV-2 infection  and should not be used as the sole basis for treatment or other  patient management decisions.  A negative result may occur with  improper specimen collection / handling, submission of specimen other  than nasopharyngeal swab, presence of viral mutation(s) within the  areas targeted by this assay, and inadequate number of viral copies  (<250 copies / mL). A negative result must be combined with clinical  observations, patient history, and epidemiological information. If result is POSITIVE SARS-CoV-2 target nucleic acids are DETECTED. The S ARS-CoV-2 RNA is generally detectable in upper and lower  respiratory specimens during the acute phase of infection.  Positive  results are indicative of active infection with SARS-CoV-2.  Clinical  correlation with patient history and other diagnostic information is  necessary to determine patient infection status.  Positive results do  not rule out bacterial infection or co-infection with other viruses. If result is PRESUMPTIVE POSTIVE SARS-CoV-2 nucleic acids MAY BE PRESENT.   A presumptive positive result was obtained on the submitted specimen  and confirmed on repeat testing.  While 2019 novel coronavirus  (SARS-CoV-2) nucleic acids may be present in the submitted sample  additional confirmatory testing may be necessary for epidemiological  and / or clinical management purposes  to differentiate between  SARS-CoV-2 and other Sarbecovirus currently known to infect humans.  If clinically indicated additional testing with an alternate test  methodology [phone]) is adv ised. The SARS-CoV-2 RNA is generally    detectable in upper and lower respiratory specimens during the acute  phase of infection. The expected result is Negative. Fact Sheet for Patients:  StrictlyIdeas.no Fact Sheet for Healthcare Providers: BankingDealers.co.za This test is not yet approved or cleared by the [place] FDA and has been authorized for detection and/or diagnosis of SARS-CoV-2 by FDA under an Emergency Use Authorization (EUA).  This EUA will remain in effect (meaning this test can  be used) for the duration of the COVID-19 declaration under Section 564(b)(1) of the Act, 21 U.S.C. section 360bbb-3(b)(1), unless the authorization is terminated or revoked sooner. Performed at [facility], [address], [place], [place] [address]     Radiology Reports Dg Abdomen 1 View  Result Date: 05/09/2019 CLINICAL DATA:  84 year old female with acute abdominal pain and burning. EXAM: ABDOMEN - 1 VIEW COMPARISON:  08/25/2017 radiographs FINDINGS: Nondistended gas-filled loops of colon and small bowel are noted. No dilated bowel loops are present. Abdominal wall mesh markers are present. No suspicious calcifications are identified. No acute bony abnormalities are noted. Degenerative changes in the LOWER lumbar spine are again identified. IMPRESSION: Nonspecific nonobstructive bowel gas pattern. Electronically Signed   By: Margarette Canada M.D.   On: 05/09/2019 21:37   Ct Head Wo Contrast  Result Date: 04/26/2019 CLINICAL DATA:  Weakness, cough and shortness of breath for 1 week EXAM: CT HEAD WITHOUT CONTRAST TECHNIQUE: Contiguous axial images were obtained from the base of the skull through the vertex without intravenous contrast. COMPARISON:  CT 10/19/2017 FINDINGS: Imaging quality is motion degraded. Brain: No evidence of acute infarction, hemorrhage, hydrocephalus, extra-axial collection or mass lesion/mass effect. Symmetric prominence of the ventricles, cisterns and sulci compatible  with parenchymal volume loss. Patchy areas of white matter hypoattenuation are most compatible with chronic microvascular angiopathy. Vascular: Atherosclerotic calcification of the carotid siphons and intradural vertebral arteries. No hyperdense vessel. Skull: No calvarial fracture or suspicious osseous lesion. No scalp swelling or hematoma. Sinuses/Orbits: Retention cyst present in the right maxillary sinus. Remaining paranasal sinuses and mastoid air cells are predominantly clear. Included orbital structures are unremarkable. Other: None IMPRESSION: Motion degraded study. No definite acute intracranial abnormality. Chronic microvascular changes and parenchymal volume loss are similar to prior. Electronically Signed   By: [name] M.D.   On: 04/26/2019 01:22   Dg Chest Port 1 View  Result Date: 05/10/2019 CLINICAL DATA:  Shortness of breath period EXAM: PORTABLE CHEST 1 VIEW COMPARISON:  May 09, 2019 FINDINGS: Lungs hyperexpanded. Patchy airspace consolidation throughout much of the left lung is again noted with overall slight clearing in the left upper lobe and left base regions. Right lung is clear. Pacemaker lead tip is attached to the right ventricle. Cardiomegaly is stable. The pulmonary vascularity is normal. There is aortic atherosclerosis. No adenopathy. Bones are osteoporotic. Sclerotic lesion in the proximal right humerus is stable. There is superior migration of each humeral head. IMPRESSION: Areas of airspace opacity consistent with multifocal pneumonia on the left. Slight partial clearing in left upper lobe and left base regions. No new opacity evident. Right lung clear. Underlying hyperexpansion of the lungs. Stable cardiomegaly. Stable pacemaker position. Aortic Atherosclerosis (ICD10-I70.0). Sclerotic area in the proximal right humerus consistent with either bone infarct or enchondroma, stable. Superior migration of each humeral head, a finding indicative of chronic rotator cuff tears.  Electronically Signed   By: Lowella Grip III M.D.   On: 05/10/2019 08:22   Dg Chest Portable 1 View  Result Date: 05/09/2019 CLINICAL DATA:  Cough and wheezing. EXAM: PORTABLE CHEST 1 VIEW COMPARISON:  Chest radiograph 04/25/2019, CT 09/12/2018 FINDINGS: Left-sided pacemaker in place. Mild cardiomegaly with unchanged mediastinal contours. Increased bronchial thickening and interstitial opacities since prior exam. Suspected small pleural effusions, left greater than right. New patchy opacity in the left suprahilar lung. No pneumothorax. Chronic benign lesion in the right proximal humerus. IMPRESSION: 1. New patchy opacity in the left suprahilar lung, suspicious for pneumonia. Followup PA  and lateral chest X-ray is recommended in 3-4 weeks following trial of antibiotic therapy to ensure resolution and exclude underlying malignancy. 2. Increased interstitial opacities and bronchial thickening since prior, infectious versus pulmonary edema. 3. Suspected small pleural effusions. Electronically Signed   By: Keith Rake M.D.   On: 05/09/2019 22:40   Dg Chest Portable 1 View  Result Date: 04/25/2019 CLINICAL DATA:  Shortness of breath EXAM: PORTABLE CHEST 1 VIEW COMPARISON:  08/11/2018, 08/07/2017 FINDINGS: Left-sided pacing device as before. Stable cardiomediastinal silhouette with aortic atherosclerosis. No focal consolidation or effusion. Chronic appearing bronchitic changes. No pneumothorax. Stable sclerotic focus in the right humeral head. IMPRESSION: No active disease. Chronic interstitial opacities. Mild cardiomegaly Electronically Signed   By: Donavan Foil M.D.   On: 04/25/2019 23:10

## 2019-05-10 NOTE — Evaluation (Signed)
Physical Therapy Evaluation Patient Details Name: [name] MRN: [id number] DOB: 17-Dec-1934 Today's Date: 05/10/2019   History of Present Illness  84 y/o female, w/ hx of CAD, a-fib on warfarin, anemia, pacemaker, chronic diastolic CHF, COPD w/ chronic hypoxic respiratory failure, HTN, CKD III, DM II, anxiety presents with burning upper abdomen pain.  Clinical Impression   Pt admitted with above diagnosis. Pt currently with functional limitations due to the deficits listed below (see PT Problem List). Pt will benefit from skilled PT to increase her strength, balance and coordination,  independence and safety with mobility to allow discharge to the venue listed below.       Follow Up Recommendations Home health PT;Supervision for mobility/OOB;Supervision - Intermittent    Equipment Recommendations  None recommended by PT    Recommendations for Other Services       Precautions / Restrictions Precautions Precautions: Fall;ICD/Pacemaker Restrictions Weight Bearing Restrictions: No      Mobility  Bed Mobility Overal bed mobility: Needs Assistance Bed Mobility: Rolling;Supine to Sit Rolling: Supervision   Supine to sit: Supervision        Transfers Overall transfer level: Needs assistance Equipment used: Rolling walker (2 wheeled) Transfers: Sit to/from [employer] Transfers Sit to Stand: Min guard Stand pivot transfers: Min guard       General transfer comment: needs increased a sec to line management  Ambulation/Gait Ambulation/Gait assistance: Min guard Gait Distance (Feet): 5 Feet Assistive device: Rolling walker (2 wheeled) Gait Pattern/deviations: Decreased step length - right;Decreased step length - left;Decreased stride length     General Gait Details: on 2.5L/min via [place] mesuared on finger probe, on 90s throughout  [employer] Mobility    Modified Rankin (Stroke Patients Only)       Balance Overall balance  assessment: Needs assistance Sitting-balance support: Feet supported Sitting balance-Leahy Scale: Good     Standing balance support: Bilateral upper extremity supported Standing balance-Leahy Scale: Fair Standing balance comment: uses RW for UE support                             Pertinent Vitals/Pain Pain Assessment: 0-10 Pain Score: 4  Pain Location: right side adbomen Pain Descriptors / Indicators: Aching Pain Intervention(s): Limited activity within patient's tolerance;Monitored during session    [place] expects to be discharged to:: Private residence Living Arrangements: Other relatives Available Help at Discharge: Family;Available PRN/intermittently Type of Home: Mobile home Home Access: Stairs to enter;Ramped entrance Entrance Stairs-Rails: Left Entrance Stairs-Number of Steps: 2 Home Layout: One level Home Equipment: Kasandra Knudsen - quad;Walker - 2 wheels;Toilet riser      Prior Function Level of Independence: Needs assistance   Gait / Transfers Assistance Needed: was I prior to hospitalization w/ RW but has been needing some assist from family  ADL's / Homemaking Assistance Needed: yes        Hand Dominance   Dominant Hand: Left    Extremity/Trunk Assessment   Upper Extremity Assessment Upper Extremity Assessment: Defer to OT evaluation    Lower Extremity Assessment Lower Extremity Assessment: Overall WFL for tasks assessed    Cervical / Trunk Assessment Cervical / Trunk Assessment: Kyphotic  Communication   Communication: No difficulties  Cognition Arousal/Alertness: Lethargic Behavior During Therapy: WFL for tasks assessed/performed Overall Cognitive Status: Within Functional Limits for tasks assessed  General Comments General comments (skin integrity, edema, etc.): Pt has been admitted to hospital atleast one other time this month, she states that she was quite  independent prior to all this but now needs some assist from family.    Exercises     Assessment/Plan    PT Assessment Patient needs continued PT services  PT Problem List Decreased strength;Decreased activity tolerance;Decreased balance;Decreased coordination       PT Treatment Interventions Therapeutic activities;Therapeutic exercise;Balance training;Neuromuscular re-education;Patient/family education;Gait training    PT Goals (Current goals can be found in the Care Plan section)  Acute Rehab PT Goals Patient Stated Goal: return home with family to assist PT Goal Formulation: With patient Time For Goal Achievement: 05/23/20 Potential to Achieve Goals: Good    Frequency Min 3X/week   Barriers to discharge        Co-evaluation               AM-PAC PT "6 Clicks" Mobility  Outcome Measure Help needed turning from your back to your side while in a flat bed without using bedrails?: None Help needed moving from lying on your back to sitting on the side of a flat bed without using bedrails?: None Help needed moving to and from a bed to a chair (including a wheelchair)?: A Little Help needed standing up from a chair using your arms (e.g., wheelchair or bedside chair)?: A Little Help needed to walk in hospital room?: A Lot Help needed climbing 3-5 steps with a railing? : A Lot 6 Click Score: 18    End of Session Equipment Utilized During Treatment: Oxygen Activity Tolerance: Patient limited by lethargy Patient left: in chair;with call bell/phone within reach Nurse Communication: Mobility status PT Visit Diagnosis: Unsteadiness on feet (R26.81);Other abnormalities of gait and mobility (R26.89);Muscle weakness (generalized) (M62.81)    Time: 0109-3235 PT Time Calculation (min) (ACUTE ONLY): 38 min   Charges:   PT Evaluation $PT Eval Moderate Complexity: 1 Mod PT Treatments $Gait Training: 8-22 mins $Therapeutic Activity: 8-22 mins        Horald Chestnut,  PT   Delford Field 05/10/2019, 1:44 PM

## 2019-05-10 NOTE — Plan of Care (Signed)
Pt educated on importance of turning and deep breathing

## 2019-05-10 NOTE — Progress Notes (Signed)
Pharmacy Antibiotic Note  [name] is a 84 y.o. female admitted on 05/09/2019 with COVID 19.  Pharmacy has been consulted for Remdesivir dosing.  CXR shows new patchy opacity in the left suprahilar lung, suspicious for pneumonia. Pt requiring supplemental oxygen (3L [place]) ALT 27  Plan: Remdesivir 200mg  IV now then 100mg  IV daily x 4 days Will f/u ALT and pt's clinical condition   Height: 5\' 2"  (157.5 cm) Weight: 103 lb (46.7 kg) IBW/kg (Calculated) : 50.1  Temp (24hrs), Avg:98.1 F (36.7 C), Min:98 F (36.7 C), Max:98.2 F (36.8 C)  Recent Labs  Lab 05/09/19 1736 05/10/19 0420  WBC 7.9 6.9  CREATININE 1.19*  --     Estimated Creatinine Clearance: 26.9 mL/min (A) (by C-G formula based on SCr of 1.19 mg/dL (H)).    Allergies  Allergen Reactions  . Macrobid [Nitrofurantoin Monohyd Macro] Rash  . Torsemide Nausea And Vomiting  . Tramadol Nausea Only    Antimicrobials this admission: 9/30 Ceftriaxone  >>  9/30 Azithromycin >>  9/30 Remdesivir >>10/4  Thank you for allowing pharmacy to be a part of this patient's care.  Sherlon Handing, PharmD, BCPS CGV Clinical pharmacist phone [phone] 05/10/2019 5:37 AM

## 2019-05-10 NOTE — ED Notes (Signed)
ED TO INPATIENT HANDOFF REPORT  ED Nurse Name and Phone #:  Eustaquio Maize [phone]  S Name/Age/Gender [name] 84 y.o. female Room/Bed: APA11/APA11  Code Status   Code Status: Full Code  Home/SNF/Other Home Patient oriented to: self, place, time and situation Is this baseline? Yes   Triage Complete: Triage complete  Chief Complaint Abdominal Pain  Triage Note Pt having abdominal burning. Has history of same. PCP stated she was referred to a GI doctor today.    Allergies Allergies  Allergen Reactions  . Macrobid [Nitrofurantoin Monohyd Macro] Rash  . Torsemide Nausea And Vomiting  . Tramadol Nausea Only    Level of Care/Admitting Diagnosis ED Disposition    ED Disposition Condition [facility]: [place] [[id number]]  Level of Care: Telemetry [5]  Covid Evaluation: Confirmed COVID Positive  Diagnosis: Pneumonia [[id number]]  Admitting Physician: Vianne Bulls [[id number]]  Attending Physician: Vianne Bulls [[id number]]  Estimated length of stay: past midnight tomorrow  Certification:: I certify this patient will need inpatient services for at least 2 midnights  PT Class (Do Not Modify): Inpatient [101]  PT Acc Code (Do Not Modify): Private [1]       B Medical/Surgery History Past Medical History:  Diagnosis Date  . Abnormal CT of the chest    Mediastinal and hilar adenopathy followed by Dr. Koleen Nimrod in [place]  . Anemia, unspecified   . Body mass index (BMI) of 20.0-20.9 in adult FEB 2013 147 LBS  . CAD (coronary artery disease) 2011   Catheterization August 2011: mild nonobstructive coronary artery disease complicated by large left rectus sheath hematoma status post evacuation Coumadin restarted without recurrence  . Carotid artery disease ([place])    Doppler, 05/2012, 0-39% bilateral  . Cataract   . Chronic airway obstruction, not elsewhere classified   . Chronic kidney disease, stage II (mild)   . Congestive heart failure,  unspecified    EF now 60%, previous nonischemic cardiomyopathy  . Diabetes mellitus   . Dilated bile duct 10/19/2011  . Diverticulitis Dec 2012  . Gastritis    By EGD  . GERD (gastroesophageal reflux disease)   . Hypercholesterolemia   . Hypertension   . Mitral regurgitation 06/27/2013  . Neurogenic sleep apnea    Uses noctural oxygen prescribed by her pulmonologi  . Pacemaker    Single chamber [employer]. Jude ACCENT [phone] SN 719 04/11/1957 10/31/2010  . Permanent atrial fibrillation    H/o tachybradycardia syndrome on Coumadin anticoagulation, has pacemaker.  . Recurrent UTI   . Rheumatic heart disease   . Tachycardia-bradycardia [facility])    s/p St. Jude single chamber PPM 10/2010   . Unspecified hypertensive kidney disease with chronic kidney disease stage I through stage IV, or unspecified(403.90)   . Valvular heart disease    a. H/o moderate mitral stenosis by cath 2011 but no mention of this on 10/2011 echo. b. 10/2011 echo: mod MR, mild AS, mild TR; EF>65%  . Vitamin D deficiency    Past Surgical History:  Procedure Laterality Date  . CHOLECYSTECTOMY     40+ years ago  . COLONOSCOPY  10/2011   Dyann Ruddle sigmoid to descending diverticulosis, internal hemorrhoids  . ESOPHAGOGASTRODUODENOSCOPY  10/2011   Fields-erosive gastritis, biopsy with no H. pylori, hiatal hernia, peptic duodenitis, no celiac disease, duodenal diverticulum, duodenal nodule  . EUS  11/16/11   Mishra-13 MM CBD, normal pancreatic duct, otherwise normal EUS  . Evacuation of retroperitoneal and left rectus sheath    .  HEMATOMA EVACUATION     femoral  . HERNIA REPAIR     X3  . PILONIDAL CYST / SINUS EXCISION    . Single-chamber pacemaker implantation  3/12   by Dr Stacey Drain IV Location/Drains/Wounds Patient Lines/Drains/Airways Status   Active Line/Drains/Airways    Name:   Placement date:   Placement time:   Site:   Days:   Peripheral IV 05/10/19 Right Forearm   05/10/19    0142    Forearm   less than 1           Intake/Output Last 24 hours  Intake/Output Summary (Last 24 hours) at 05/10/2019 0201 Last data filed at 05/10/2019 0122 Gross per 24 hour  Intake 250 ml  Output -  Net 250 ml    Labs/Imaging Results for orders placed or performed during the hospital encounter of 05/09/19 (from the past 48 hour(s))  Lipase, blood     Status: None   Collection Time: 05/09/19  5:36 PM  Result Value Ref Range   Lipase 21 11 - 51 U/L    Comment: Performed at [facility], [address] [place], [place] [address]  Comprehensive metabolic panel     Status: Abnormal   Collection Time: 05/09/19  5:36 PM  Result Value Ref Range   Sodium 136 135 - 145 mmol/L   Potassium 3.6 3.5 - 5.1 mmol/L   Chloride 96 (L) 98 - 111 mmol/L   CO2 32 22 - 32 mmol/L   Glucose, Bld 111 (H) 70 - 99 mg/dL   BUN 28 (H) 8 - 23 mg/dL   Creatinine, Ser 1.19 (H) 0.44 - 1.00 mg/dL   Calcium 8.3 (L) 8.9 - 10.3 mg/dL   Total Protein 6.3 (L) 6.5 - 8.1 g/dL   Albumin 2.8 (L) 3.5 - 5.0 g/dL   AST 32 15 - 41 U/L   ALT 27 0 - 44 U/L   Alkaline Phosphatase 78 38 - 126 U/L   Total Bilirubin 0.6 0.3 - 1.2 mg/dL   GFR calc non Af Amer 42 (L) >60 mL/min   GFR calc Af Amer 49 (L) >60 mL/min   Anion gap 8 5 - 15    Comment: Performed at [facility], [address] [place], [place] [address]  CBC     Status: Abnormal   Collection Time: 05/09/19  5:36 PM  Result Value Ref Range   WBC 7.9 4.0 - 10.5 K/uL   RBC 3.39 (L) 3.87 - 5.11 MIL/uL   Hemoglobin 10.9 (L) 12.0 - 15.0 g/dL   HCT 36.0 36.0 - 46.0 %   MCV 106.2 (H) 80.0 - 100.0 fL   MCH 32.2 26.0 - 34.0 pg   MCHC 30.3 30.0 - 36.0 g/dL   RDW 14.6 11.5 - 15.5 %   Platelets 123 (L) 150 - 400 K/uL   nRBC 0.0 0.0 - 0.2 %    Comment: Performed at [facility], [address] [place], [place] [address]  Urinalysis, Routine w reflex microscopic     Status: Abnormal   Collection Time: 05/09/19  8:45 PM  Result Value Ref Range   Color, Urine YELLOW YELLOW   APPearance CLOUDY (A)  CLEAR   Specific Gravity, Urine 1.018 1.005 - 1.030   pH 7.0 5.0 - 8.0   Glucose, UA NEGATIVE NEGATIVE mg/dL   Hgb urine dipstick NEGATIVE NEGATIVE   Bilirubin Urine NEGATIVE NEGATIVE   Ketones, ur NEGATIVE NEGATIVE mg/dL   Protein, ur 100 (A)  NEGATIVE mg/dL   Nitrite NEGATIVE NEGATIVE   Leukocytes,Ua NEGATIVE NEGATIVE   WBC, UA 0-5 0 - 5 WBC/hpf   Bacteria, UA RARE (A) NONE SEEN   Squamous Epithelial / LPF 6-10 0 - 5   Triple Phosphate Crystal PRESENT     Comment: Performed at [facility], [address] [place], [place] [address]  Troponin I (High Sensitivity)     Status: Abnormal   Collection Time: 05/09/19  9:09 PM  Result Value Ref Range   Troponin I (High Sensitivity) 30 (H) <18 ng/L    Comment: (NOTE) Elevated high sensitivity troponin I (hsTnI) values and significant  changes across serial measurements may suggest ACS but many other  chronic and acute conditions are known to elevate hsTnI results.  Refer to the "Links" section for chest pain algorithms and additional  guidance. Performed at [facility], [address] [place], [place] [address]   SARS Coronavirus 2 [facility] order, Performed in [facility] hospital lab) Nasopharyngeal Nasopharyngeal Swab     Status: Abnormal   Collection Time: 05/09/19 10:22 PM   Specimen: Nasopharyngeal Swab  Result Value Ref Range   SARS Coronavirus 2 POSITIVE (A) NEGATIVE    Comment: RESULT CALLED TO, READ BACK BY AND VERIFIED WITH: M DOSS,RN @0020  05/10/19 MKELLY (NOTE) If result is NEGATIVE SARS-CoV-2 target nucleic acids are NOT DETECTED. The SARS-CoV-2 RNA is generally detectable in upper and lower  respiratory specimens during the acute phase of infection. The lowest  concentration of SARS-CoV-2 viral copies this assay can detect is 250  copies / mL. A negative result does not preclude SARS-CoV-2 infection  and should not be used as the sole basis for treatment or other  patient management decisions.  A negative result may  occur with  improper specimen collection / handling, submission of specimen other  than nasopharyngeal swab, presence of viral mutation(s) within the  areas targeted by this assay, and inadequate number of viral copies  (<250 copies / mL). A negative result must be combined with clinical  observations, patient history, and epidemiological information. If result is POSITIVE SARS-CoV-2 target nucleic acids are DETECTED. The S ARS-CoV-2 RNA is generally detectable in upper and lower  respiratory specimens during the acute phase of infection.  Positive  results are indicative of active infection with SARS-CoV-2.  Clinical  correlation with patient history and other diagnostic information is  necessary to determine patient infection status.  Positive results do  not rule out bacterial infection or co-infection with other viruses. If result is PRESUMPTIVE POSTIVE SARS-CoV-2 nucleic acids MAY BE PRESENT.   A presumptive positive result was obtained on the submitted specimen  and confirmed on repeat testing.  While 2019 novel coronavirus  (SARS-CoV-2) nucleic acids may be present in the submitted sample  additional confirmatory testing may be necessary for epidemiological  and / or clinical management purposes  to differentiate between  SARS-CoV-2 and other Sarbecovirus currently known to infect humans.  If clinically indicated additional testing with an alternate test  methodology [phone]) is adv ised. The SARS-CoV-2 RNA is generally  detectable in upper and lower respiratory specimens during the acute  phase of infection. The expected result is Negative. Fact Sheet for Patients:  StrictlyIdeas.no Fact Sheet for Healthcare Providers: BankingDealers.co.za This test is not yet approved or cleared by the [place] FDA and has been authorized for detection and/or diagnosis of SARS-CoV-2 by FDA under an Emergency Use Authorization (EUA).  This  EUA will remain in effect (meaning this  test can be used) for the duration of the COVID-19 declaration under Section 564(b)(1) of the Act, 21 U.S.C. section 360bbb-3(b)(1), unless the authorization is terminated or revoked sooner. Performed at [facility], [address], [place], [place] [address]   Troponin I (High Sensitivity)     Status: Abnormal   Collection Time: 05/09/19 10:40 PM  Result Value Ref Range   Troponin I (High Sensitivity) 29 (H) <18 ng/L    Comment: (NOTE) Elevated high sensitivity troponin I (hsTnI) values and significant  changes across serial measurements may suggest ACS but many other  chronic and acute conditions are known to elevate hsTnI results.  Refer to the "Links" section for chest pain algorithms and additional  guidance. Performed at [facility], [address] [place], [place] [address]   C-reactive protein     Status: Abnormal   Collection Time: 05/09/19 10:40 PM  Result Value Ref Range   CRP 16.1 (H) <1.0 mg/dL    Comment: Performed at [facility], [address], [place], [place] [address]  D-dimer, quantitative (not at [facility])     Status: Abnormal   Collection Time: 05/09/19 10:40 PM  Result Value Ref Range   D-Dimer, Quant 1.41 (H) 0.00 - 0.50 ug/mL-FEU    Comment: (NOTE) At the manufacturer cut-off of 0.50 ug/mL FEU, this assay has been documented to exclude PE with a sensitivity and negative predictive value of 97 to 99%.  At this time, this assay has not been approved by the FDA to exclude DVT/VTE. Results should be correlated with clinical presentation. Performed at [facility], [address], [place], [place] [address]   Fibrinogen     Status: Abnormal   Collection Time: 05/09/19 10:40 PM  Result Value Ref Range   Fibrinogen 547 (H) 210 - 475 mg/dL    Comment: Performed at [facility], [address] [place], [place] [address]  Ferritin     Status: Abnormal   Collection Time: 05/09/19 10:40 PM  Result Value Ref Range   Ferritin  1,230 (H) 11 - 307 ng/mL    Comment: Performed at [facility], [address], [place], [place] [address]  Lactate dehydrogenase     Status: Abnormal   Collection Time: 05/09/19 10:40 PM  Result Value Ref Range   LDH 278 (H) 98 - 192 U/L    Comment: Performed at [facility], [address], [place], [place] [address]  Protime-INR     Status: Abnormal   Collection Time: 05/09/19 11:15 PM  Result Value Ref Range   Prothrombin Time 28.7 (H) 11.4 - 15.2 seconds   INR 2.8 (H) 0.8 - 1.2    Comment: (NOTE) INR goal varies based on device and disease states. Performed at [facility], [address], [place], [place] [address]   Procalcitonin - Baseline     Status: None   Collection Time: 05/09/19 11:15 PM  Result Value Ref Range   Procalcitonin 0.16 ng/mL    Comment:        Interpretation: PCT (Procalcitonin) <= 0.5 ng/mL: Systemic infection (sepsis) is not likely. Local bacterial infection is possible. (NOTE)       Sepsis PCT Algorithm           Lower Respiratory Tract                                      Infection PCT Algorithm    ----------------------------     ----------------------------  PCT < 0.25 ng/mL                PCT < 0.10 ng/mL         Strongly encourage             Strongly discourage   discontinuation of antibiotics    initiation of antibiotics    ----------------------------     -----------------------------       PCT 0.25 - 0.50 ng/mL            PCT 0.10 - 0.25 ng/mL               OR       >80% decrease in PCT            Discourage initiation of                                            antibiotics      Encourage discontinuation           of antibiotics    ----------------------------     -----------------------------         PCT >= 0.50 ng/mL              PCT 0.26 - 0.50 ng/mL               AND        <80% decrease in PCT             Encourage initiation of                                             antibiotics       Encourage continuation            of antibiotics    ----------------------------     -----------------------------        PCT >= 0.50 ng/mL                  PCT > 0.50 ng/mL               AND         increase in PCT                  Strongly encourage                                      initiation of antibiotics    Strongly encourage escalation           of antibiotics                                     -----------------------------                                           PCT <= 0.25 ng/mL  OR                                        > 80% decrease in PCT                                     Discontinue / Do not initiate                                             antibiotics Performed at [facility], [address], [place], [place] [address]   Brain natriuretic peptide     Status: Abnormal   Collection Time: 05/09/19 11:15 PM  Result Value Ref Range   B Natriuretic Peptide 440.0 (H) 0.0 - 100.0 pg/mL    Comment: Performed at [facility], [address], [place], [place] [address]   *Note: Due to a large number of results and/or encounters for the requested time period, some results have not been displayed. A complete set of results can be found in Results Review.   Dg Abdomen 1 View  Result Date: 05/09/2019 CLINICAL DATA:  84 year old female with acute abdominal pain and burning. EXAM: ABDOMEN - 1 VIEW COMPARISON:  08/25/2017 radiographs FINDINGS: Nondistended gas-filled loops of colon and small bowel are noted. No dilated bowel loops are present. Abdominal wall mesh markers are present. No suspicious calcifications are identified. No acute bony abnormalities are noted. Degenerative changes in the LOWER lumbar spine are again identified. IMPRESSION: Nonspecific nonobstructive bowel gas pattern. Electronically Signed   By: Margarette Canada M.D.   On: 05/09/2019 21:37   Dg Chest Portable 1 View  Result Date: 05/09/2019 CLINICAL DATA:  Cough and wheezing. EXAM: PORTABLE CHEST 1  VIEW COMPARISON:  Chest radiograph 04/25/2019, CT 09/12/2018 FINDINGS: Left-sided pacemaker in place. Mild cardiomegaly with unchanged mediastinal contours. Increased bronchial thickening and interstitial opacities since prior exam. Suspected small pleural effusions, left greater than right. New patchy opacity in the left suprahilar lung. No pneumothorax. Chronic benign lesion in the right proximal humerus. IMPRESSION: 1. New patchy opacity in the left suprahilar lung, suspicious for pneumonia. Followup PA and lateral chest X-ray is recommended in 3-4 weeks following trial of antibiotic therapy to ensure resolution and exclude underlying malignancy. 2. Increased interstitial opacities and bronchial thickening since prior, infectious versus pulmonary edema. 3. Suspected small pleural effusions. Electronically Signed   By: Keith Rake M.D.   On: 05/09/2019 22:40    Pending Labs Unresulted Labs (From admission, onward)    Start     Ordered   05/11/19 0500  CBC with Differential/Platelet  Daily,   R     05/10/19 0031   05/11/19 0500  C-reactive protein  Daily,   R     05/10/19 0031   05/11/19 0500  Comprehensive metabolic panel  Daily,   R     05/10/19 0031   05/11/19 0500  D-dimer, quantitative (not at [facility])  Daily,   R     05/10/19 0031   05/11/19 0500  Magnesium  Daily,   R     05/10/19 0031   05/11/19 0500  Ferritin  Daily,   R     05/10/19 0031   05/11/19 0500  Phosphorus  Daily,   R     05/10/19 0031   05/10/19 0500  CBC WITH DIFFERENTIAL  Tomorrow morning,   R     05/10/19 0011   05/10/19 0500  Protime-INR  Daily,   R     05/10/19 0046   05/09/19 2358  Culture, sputum-assessment  Once,   STAT     05/09/19 2357   05/09/19 2358  Legionella Pneumophila Serogp 1 Ur Ag  Add-on,   AD     05/09/19 2357   05/09/19 2358  Strep pneumoniae urinary antigen  Add-on,   AD     05/09/19 2357          Vitals/Pain Today's Vitals   05/09/19 2300 05/09/19 2330 05/10/19 0030 05/10/19 0147   BP: (!) 127/40 (!) 132/52 (!) 125/59 (!) 179/61  Pulse: 71 72 81 94  Resp: (!) 26 (!) 28 (!) 27 (!) 27  Temp:    98 F (36.7 C)  TempSrc:    Oral  SpO2: 93% 93% 93% 91%  Weight:      Height:      PainSc:    8     Isolation Precautions Airborne and Contact precautions  Medications Medications  sodium chloride flush (NS) 0.9 % injection 3 mL (3 mLs Intravenous Not Given 05/10/19 0035)  sucralfate (CARAFATE) 1 GM/10ML suspension 1 g (1 g Oral Given 05/09/19 2111)  cefTRIAXone (ROCEPHIN) 2 g in sodium chloride 0.9 % 100 mL IVPB (2 g Intravenous New Bag/Given 05/10/19 0143)  azithromycin (ZITHROMAX) 500 mg in sodium chloride 0.9 % 250 mL IVPB (0 mg Intravenous Stopped 05/10/19 0122)  acetaminophen (TYLENOL) tablet 650 mg (has no administration in time range)  HYDROcodone-acetaminophen (NORCO/VICODIN) 5-325 MG per tablet 1-2 tablet (2 tablets Oral Given 05/10/19 0149)  guaiFENesin-dextromethorphan (ROBITUSSIN DM) 100-10 MG/5ML syrup 5 mL (has no administration in time range)  diltiazem (CARDIZEM) tablet 120 mg (has no administration in time range)  simvastatin (ZOCOR) tablet 10 mg (has no administration in time range)  lipase/protease/amylase (CREON) capsule 36,000 Units (has no administration in time range)  pantoprazole (PROTONIX) EC tablet 40 mg (has no administration in time range)  clonazePAM (KLONOPIN) tablet 0.5 mg (0.5 mg Oral Given 05/10/19 0149)  potassium chloride SA (KLOR-CON) CR tablet 20 mEq (has no administration in time range)  mometasone-formoterol (DULERA) 200-5 MCG/ACT inhaler 2 puff (2 puffs Inhalation Given 05/10/19 0138)  ipratropium-albuterol (DUONEB) 0.5-2.5 (3) MG/3ML nebulizer solution 3 mL (has no administration in time range)  sodium chloride flush (NS) 0.9 % injection 3 mL (3 mLs Intravenous Given 05/10/19 0138)  sodium chloride flush (NS) 0.9 % injection 3 mL (3 mLs Intravenous Given 05/10/19 0138)  sodium chloride flush (NS) 0.9 % injection 3 mL (has no  administration in time range)  0.9 %  sodium chloride infusion (has no administration in time range)  senna-docusate (Senokot-S) tablet 1 tablet (has no administration in time range)  ondansetron (ZOFRAN) tablet 4 mg (has no administration in time range)    Or  ondansetron (ZOFRAN) injection 4 mg (has no administration in time range)  cefTRIAXone (ROCEPHIN) 2 g in sodium chloride 0.9 % 100 mL IVPB (has no administration in time range)  insulin aspart (novoLOG) injection 0-9 Units (has no administration in time range)  furosemide (LASIX) injection 40 mg (40 mg Intravenous Given 05/10/19 0133)  dexamethasone (DECADRON) injection 6 mg (6 mg Intravenous Given 05/10/19 0133)  Warfarin - Pharmacist Dosing Inpatient (has no administration in time range)  albuterol (VENTOLIN HFA)  108 (90 Base) MCG/ACT inhaler 2 puff (2 puffs Inhalation Given 05/09/19 2214)  warfarin (COUMADIN) tablet 1 mg (1 mg Oral Given 05/10/19 0139)  warfarin (COUMADIN) 1 MG tablet (has no administration in time range)    Mobility walks Low fall risk   Focused Assessments    R Recommendations: See Admitting Provider Note  Report given to:   Additional Notes:

## 2019-05-10 NOTE — Progress Notes (Signed)
Called Pts Daughter Johnell Comings) updated her on pts condition, O2 requirements and plan of care, will keep monitoring the pt for changes

## 2019-05-10 NOTE — Progress Notes (Signed)
ANTICOAGULATION CONSULT NOTE - Initial Consult  Pharmacy Consult for warfarin Indication: atrial fibrillation  Allergies  Allergen Reactions  . Macrobid [Nitrofurantoin Monohyd Macro] Rash  . Torsemide Nausea And Vomiting  . Tramadol Nausea Only    Patient Measurements: Height: 5\' 2"  (157.5 cm) Weight: 103 lb (46.7 kg) IBW/kg (Calculated) : 50.1 Heparin Dosing Weight: 46.7 kg  Vital Signs: Temp: 98.2 F (36.8 C) (09/29 1651) Temp Source: Oral (09/29 1651) BP: 132/52 (09/29 2330) Pulse Rate: 72 (09/29 2330)  Labs: Recent Labs    05/09/19 1736 05/09/19 2109 05/09/19 2240 05/09/19 2315  HGB 10.9*  --   --   --   HCT 36.0  --   --   --   PLT 123*  --   --   --   LABPROT  --   --   --  28.7*  INR  --   --   --  2.8*  CREATININE 1.19*  --   --   --   TROPONINIHS  --  30* 29*  --     Estimated Creatinine Clearance: 26.9 mL/min (A) (by C-G formula based on SCr of 1.19 mg/dL (H)).   Medical History: Past Medical History:  Diagnosis Date  . Abnormal CT of the chest    Mediastinal and hilar adenopathy followed by Dr. Koleen Nimrod in [place]  . Anemia, unspecified   . Body mass index (BMI) of 20.0-20.9 in adult FEB 2013 147 LBS  . CAD (coronary artery disease) 2011   Catheterization August 2011: mild nonobstructive coronary artery disease complicated by large left rectus sheath hematoma status post evacuation Coumadin restarted without recurrence  . Carotid artery disease ([place])    Doppler, 05/2012, 0-39% bilateral  . Cataract   . Chronic airway obstruction, not elsewhere classified   . Chronic kidney disease, stage II (mild)   . Congestive heart failure, unspecified    EF now 60%, previous nonischemic cardiomyopathy  . Diabetes mellitus   . Dilated bile duct 10/19/2011  . Diverticulitis Dec 2012  . Gastritis    By EGD  . GERD (gastroesophageal reflux disease)   . Hypercholesterolemia   . Hypertension   . Mitral regurgitation 06/27/2013  . Neurogenic sleep apnea    Uses noctural oxygen prescribed by her pulmonologi  . Pacemaker    Single chamber [place]. Jude ACCENT [phone] SN 719 04/11/1957 10/31/2010  . Permanent atrial fibrillation    H/o tachybradycardia syndrome on Coumadin anticoagulation, has pacemaker.  . Recurrent UTI   . Rheumatic heart disease   . Tachycardia-bradycardia [facility])    s/p St. Jude single chamber PPM 10/2010   . Unspecified hypertensive kidney disease with chronic kidney disease stage I through stage IV, or unspecified(403.90)   . Valvular heart disease    a. H/o moderate mitral stenosis by cath 2011 but no mention of this on 10/2011 echo. b. 10/2011 echo: mod MR, mild AS, mild TR; EF>65%  . Vitamin D deficiency      Assessment: [name] is a 84 y.o. female with medical history significant for CAD, afib on warfarin, chronic diastolic CHF, COPD with chronic hypoxic respiratory failure, CKD stage III, type 2 DM. Pharmacy consulted to dose warfarin while inpatient. Last dose reported taken on 9/28.  Baseline INR is therapeutic at 2.8 (Goal 2.5-3.5). Recently admitted with elevated INR up to 7.3. Additionally, azithromycin has been started.  PTA dose: warfarin 1 mg PO daily Last office visit for INR was 8/6, and dose was changed to 1mg  daily except  2mg  on thursday    Goal of Therapy:  INR 2.5-3.5 Monitor platelets by anticoagulation protocol: Yes   Plan:  Give warfarin 1 mg po x 1 Monitor daily INR, CBC, clinical course, s/sx of bleed, PO intake, DDI   Thank you for allowing [place] to participate in this patients care. Jens Som, PharmD 05/10/2019,12:21 AM

## 2019-05-10 NOTE — ED Notes (Signed)
CRITICAL VALUE ALERT  Critical Value:  COVID +  Date & Time Notied:  05/10/2019 0023  Provider Notified: Dr. Myna Hidalgo  Orders Received/Actions taken: see chart

## 2019-05-10 NOTE — Progress Notes (Signed)
Updated daughter, daughter states pt has cellulitis on leg. Notified MD singh no new orders will continue to monitor

## 2019-05-11 DIAGNOSIS — J189 Pneumonia, unspecified organism: Secondary | ICD-10-CM

## 2019-05-11 LAB — CBC WITH DIFFERENTIAL/PLATELET
Abs Immature Granulocytes: 0.02 10*3/uL (ref 0.00–0.07)
Basophils Absolute: 0 10*3/uL (ref 0.0–0.1)
Basophils Relative: 0 %
Eosinophils Absolute: 0 10*3/uL (ref 0.0–0.5)
Eosinophils Relative: 0 %
HCT: 35.9 % — ABNORMAL LOW (ref 36.0–46.0)
Hemoglobin: 11.4 g/dL — ABNORMAL LOW (ref 12.0–15.0)
Immature Granulocytes: 0 %
Lymphocytes Relative: 8 %
Lymphs Abs: 0.5 10*3/uL — ABNORMAL LOW (ref 0.7–4.0)
MCH: 31.5 pg (ref 26.0–34.0)
MCHC: 31.8 g/dL (ref 30.0–36.0)
MCV: 99.2 fL (ref 80.0–100.0)
Monocytes Absolute: 0.2 10*3/uL (ref 0.1–1.0)
Monocytes Relative: 3 %
Neutro Abs: 5.2 10*3/uL (ref 1.7–7.7)
Neutrophils Relative %: 89 %
Platelets: 165 10*3/uL (ref 150–400)
RBC: 3.62 MIL/uL — ABNORMAL LOW (ref 3.87–5.11)
RDW: 14.1 % (ref 11.5–15.5)
WBC: 5.9 10*3/uL (ref 4.0–10.5)
nRBC: 0 % (ref 0.0–0.2)

## 2019-05-11 LAB — PROTIME-INR
INR: 3.4 — ABNORMAL HIGH (ref 0.8–1.2)
Prothrombin Time: 33.7 seconds — ABNORMAL HIGH (ref 11.4–15.2)

## 2019-05-11 LAB — COMPREHENSIVE METABOLIC PANEL
ALT: 22 U/L (ref 0–44)
AST: 28 U/L (ref 15–41)
Albumin: 2.6 g/dL — ABNORMAL LOW (ref 3.5–5.0)
Alkaline Phosphatase: 76 U/L (ref 38–126)
Anion gap: 9 (ref 5–15)
BUN: 37 mg/dL — ABNORMAL HIGH (ref 8–23)
CO2: 32 mmol/L (ref 22–32)
Calcium: 9.1 mg/dL (ref 8.9–10.3)
Chloride: 96 mmol/L — ABNORMAL LOW (ref 98–111)
Creatinine, Ser: 1.17 mg/dL — ABNORMAL HIGH (ref 0.44–1.00)
GFR calc Af Amer: 50 mL/min — ABNORMAL LOW (ref >60)
GFR calc non Af Amer: 43 mL/min — ABNORMAL LOW (ref >60)
Glucose, Bld: 143 mg/dL — ABNORMAL HIGH (ref 70–99)
Potassium: 4.2 mmol/L (ref 3.5–5.1)
Sodium: 137 mmol/L (ref 135–145)
Total Bilirubin: 0.5 mg/dL (ref 0.3–1.2)
Total Protein: 6.2 g/dL — ABNORMAL LOW (ref 6.5–8.1)

## 2019-05-11 LAB — BRAIN NATRIURETIC PEPTIDE: B Natriuretic Peptide: 331 pg/mL — ABNORMAL HIGH (ref 0.0–100.0)

## 2019-05-11 LAB — FERRITIN: Ferritin: 1457 ng/mL — ABNORMAL HIGH (ref 11–307)

## 2019-05-11 LAB — MAGNESIUM: Magnesium: 2.1 mg/dL (ref 1.7–2.4)

## 2019-05-11 LAB — LEGIONELLA PNEUMOPHILA SEROGP 1 UR AG: L. pneumophila Serogp 1 Ur Ag: NEGATIVE

## 2019-05-11 LAB — PROCALCITONIN: Procalcitonin: 0.95 ng/mL

## 2019-05-11 LAB — LACTATE DEHYDROGENASE: LDH: 259 U/L — ABNORMAL HIGH (ref 98–192)

## 2019-05-11 LAB — GLUCOSE, CAPILLARY
Glucose-Capillary: 147 mg/dL — ABNORMAL HIGH (ref 70–99)
Glucose-Capillary: 121 mg/dL — ABNORMAL HIGH (ref 70–99)
Glucose-Capillary: 163 mg/dL — ABNORMAL HIGH (ref 70–99)
Glucose-Capillary: 218 mg/dL — ABNORMAL HIGH (ref 70–99)
Glucose-Capillary: 199 mg/dL — ABNORMAL HIGH (ref 70–99)

## 2019-05-11 LAB — D-DIMER, QUANTITATIVE: D-Dimer, Quant: 1.45 ug/mL-FEU — ABNORMAL HIGH (ref 0.00–0.50)

## 2019-05-11 LAB — C-REACTIVE PROTEIN: CRP: 12 mg/dL — ABNORMAL HIGH (ref <1.0)

## 2019-05-11 MED ORDER — DICLOFENAC SODIUM 1 % TD GEL
2.0000 g | Freq: Four times a day (QID) | TRANSDERMAL | Status: DC
  Administered 2019-05-11 – 2019-05-12 (×5): 2 g via TOPICAL
  Filled 2019-05-11: qty 100

## 2019-05-11 MED ORDER — POTASSIUM CHLORIDE CRYS ER 20 MEQ PO TBCR
20.0000 meq | EXTENDED_RELEASE_TABLET | Freq: Every day | ORAL | Status: DC
  Administered 2019-05-11 – 2019-05-12 (×2): 20 meq via ORAL
  Filled 2019-05-11 (×2): qty 1

## 2019-05-11 MED ORDER — PNEUMOCOCCAL VAC POLYVALENT 25 MCG/0.5ML IJ INJ
0.5000 mL | INJECTION | Freq: Once | INTRAMUSCULAR | Status: DC
  Filled 2019-05-11: qty 0.5

## 2019-05-11 MED ORDER — ALUM & MAG HYDROXIDE-SIMETH 200-200-20 MG/5ML PO SUSP
30.0000 mL | Freq: Once | ORAL | Status: AC
  Administered 2019-05-11: 30 mL via ORAL
  Filled 2019-05-11: qty 30

## 2019-05-11 MED ORDER — WARFARIN 0.5 MG HALF TABLET
0.5000 mg | ORAL_TABLET | Freq: Once | ORAL | Status: AC
  Administered 2019-05-11: 17:00:00 0.5 mg via ORAL
  Filled 2019-05-11: qty 1

## 2019-05-11 MED ORDER — DEXAMETHASONE SODIUM PHOSPHATE 4 MG/ML IJ SOLN
4.0000 mg | INTRAMUSCULAR | Status: DC
  Administered 2019-05-11: 20:00:00 4 mg via INTRAVENOUS
  Filled 2019-05-11: qty 1

## 2019-05-11 NOTE — Progress Notes (Signed)
Called daughter, no answer, voicemail left.

## 2019-05-11 NOTE — Progress Notes (Signed)
Physical Therapy Treatment Patient Details Name: [name] MRN: [id number] DOB: [birth year] Today's Date: 05/11/2019    History of Present Illness 84 y/o female, w/ hx of CAD, a-fib on warfarin, anemia, pacemaker, chronic diastolic CHF, COPD w/ chronic hypoxic respiratory failure, HTN, CKD III, DM II, anxiety presents with burning upper abdomen pain.    PT Comments    Pt more alert today able to tolerate more activity than previous session. PT on 2L/min via [place] measured on nelcor finger probe. Ambulated in room approx 55ft w/ RW and CGA, did desat with this but able to recover faily quickly, within 2-57mins w/ cues.    Follow Up Recommendations  Home health PT;Supervision for mobility/OOB;Supervision - Intermittent     Equipment Recommendations  None recommended by PT    Recommendations for Other Services       Precautions / Restrictions Precautions Precautions: Fall;ICD/Pacemaker Restrictions Weight Bearing Restrictions: No    Mobility  Bed Mobility               General bed mobility comments: Pt received sitting in recliner  Transfers Overall transfer level: Needs assistance Equipment used: Rolling walker (2 wheeled) Transfers: Sit to/from Stand;Stand Pivot Transfers Sit to Stand: Min guard Stand pivot transfers: Min guard       General transfer comment: needs increased a sec to line management  Ambulation/Gait Ambulation/Gait assistance: Min guard Gait Distance (Feet): 45 Feet Assistive device: Rolling walker (2 wheeled)   Gait velocity: decreased   General Gait Details: on 2L/min via [place] mesuared on finger probe, desats to low 80s but is able to recover with pursed lip breathing   Stairs             Wheelchair Mobility    Modified Rankin (Stroke Patients Only)       Balance Overall balance assessment: Needs assistance Sitting-balance support: Feet supported Sitting balance-Leahy Scale: Good     Standing balance support:  Bilateral upper extremity supported Standing balance-Leahy Scale: Good   Single Leg Stance - Right Leg: (stands on single limb to donn pants but needs UE support) Single Leg Stance - Left Leg: (needs UE support able to stand on single limb briefly)                        Cognition Arousal/Alertness: Awake/alert Behavior During Therapy: WFL for tasks assessed/performed Overall Cognitive Status: Within Functional Limits for tasks assessed                                 General Comments: pt is more animated and alert today      Exercises      General Comments General comments (skin integrity, edema, etc.): Pt moving much better today, she is tolerating more activity than previous session. But needs increased rest breaks between activities as desats to low 80s. Pt stays on 2L/min via [place] read on Nelcor finger probe      Pertinent Vitals/Pain Pain Assessment: 0-10 Pain Score: 5  Pain Location: Back and R hip Pain Intervention(s): Limited activity within patient's tolerance    Home Living                      Prior Function            PT Goals (current goals can now be found in the care plan section) Acute Rehab PT Goals Patient Stated Goal:  return home with family to assist PT Goal Formulation: With patient Time For Goal Achievement: 05/24/19 Potential to Achieve Goals: Good    Frequency    Min 3X/week      PT Plan Current plan remains appropriate    Co-evaluation              AM-PAC PT "6 Clicks" Mobility   Outcome Measure  Help needed turning from your back to your side while in a flat bed without using bedrails?: None Help needed moving from lying on your back to sitting on the side of a flat bed without using bedrails?: None Help needed moving to and from a bed to a chair (including a wheelchair)?: A Little Help needed standing up from a chair using your arms (e.g., wheelchair or bedside chair)?: A Little Help needed to  walk in hospital room?: A Lot Help needed climbing 3-5 steps with a railing? : A Lot 6 Click Score: 18    End of Session Equipment Utilized During Treatment: Oxygen Activity Tolerance: Patient limited by lethargy Patient left: in chair;with call bell/phone within reach   PT Visit Diagnosis: Unsteadiness on feet (R26.81);Other abnormalities of gait and mobility (R26.89);Muscle weakness (generalized) (M62.81)     Time: 1610-9604 PT Time Calculation (min) (ACUTE ONLY): 42 min  Charges:  $Gait Training: 8-22 mins $Therapeutic Activity: 8-22 mins $Self Care/Home Management: [place] 05/11/2019, 1:27 PM

## 2019-05-11 NOTE — Progress Notes (Signed)
PROGRESS NOTE                                                                                                                                                                                                             Patient Demographics:    [name], is a 84 y.o. female, DOB - 1935/06/04, PYP:[id number]  Outpatient Primary MD for the patient is [name], FNP    LOS - 2  Admit date - 05/09/2019    Chief Complaint  Patient presents with   Abdominal Pain       Brief Narrative  [name] is a 84 y.o. female with medical history significant for coronary artery disease, chronic atrial fibrillation on warfarin, chronic diastolic CHF, COPD with chronic hypoxic respiratory failure, chronic kidney disease stage III, type 2 diabetes mellitus, anxiety, now presenting to the emergency department for evaluation of burning discomfort in the upper abdomen.  In the ER she was diagnosed with COVID-19 pneumonitis, acute on chronic diastolic CHF and admitted to the hospital.   Subjective:   Patient in bed, appears comfortable, denies any headache, no fever, no chest pain or pressure, no shortness of breath , no abdominal pain. No focal weakness.  Only complaint today is chronic low back pain.   Assessment  & Plan :     1. Acute Hypoxic Resp. Failure due to Acute Covid 19 Viral Pneumonitis during the ongoing 2020 Covid 19 Pandemic - her disease is quite mild, inflammatory markers however are elevated, currently placed on Remdisvir along with Decadron and clinically seems to be doing better.  Will be closely monitored especially her inflammatory markers and clinically.  She has been requested to sit up in chair and use flutter valve and I-S for pulmonary toiletry, she has been counseled to quit smoking, will monitor her closely on these medications.  COVID-19 Labs  Recent Labs    05/09/19 2240 05/10/19 0808  05/11/19 0312  DDIMER 1.41* 2.00* 1.45*  FERRITIN 1,230* 1,341* 1,457*  LDH 278*  --  259*  CRP 16.1* 15.3* 12.0*    Lab Results  Component Value Date   SARSCOV2NAA POSITIVE (A) 05/09/2019   [place] NEGATIVE 04/25/2019   [place] Not Detected 12/29/2018     Hepatic Function Latest Ref Rng & Units 05/11/2019 05/10/2019 05/09/2019  Total Protein 6.5 - 8.1 g/dL 6.2(L) 6.2(L)  6.3(L)  Albumin 3.5 - 5.0 g/dL 2.6(L) 2.7(L) 2.8(L)  AST 15 - 41 U/L 28 29 32  ALT 0 - 44 U/L _0 Alk Phosphatase 38 - 126 U/L 76 79 78  Total Bilirubin 0.3 - 1.2 mg/dL 0.5 0.7 0.6  Bilirubin, Direct 0.0 - 0.3 mg/dL - - -        Component Value Date/Time   BNP 331.0 (H) 05/11/2019 0312      2.  Acute on chronic diastolic CHF last EF 74% on echocardiogram .  Echo from this admission noted, continue diuresis with Lasix and Aldactone, fluid and salt restriction, will monitor and adjust electrolytes and diuretics as needed.  3.  Chronic atrial fibrillation [place] vas 2 score of at least 3.  On diltiazem and Coumadin pharmacy monitoring INR.  Goal rate control.  4.  With underlying chronic hypoxic respiratory failure.  Continue supportive care with nebulizer treatments, uses 2 L nasal cannula oxygen at home at all times continue.  5.  CAD with chronic left bundle branch block.  Chest pain-free no acute issues mildly elevated troponin likely due to hypoxic respiratory failure from reasons #1 and 2 above.  Troponin trend is flat and and non-ACS pattern, continue combination of statin, Cardizem and Coumadin.  Outpatient cardiology follow-up.  No acute issues.  6.  Chronic anxiety and depression.  Home dose Klonopin and other medications will be continued.  7.  Smoking.  Counseled to quit.  8.  GERD and initial GI complaints.  After receiving Carafate in the ER the symptoms have resolved, continue PPI and Carafate combination here.  Outpatient GI follow-up if needed.  9.  Left lower extremity wound  present on admission .  Wound present on admission almost completely healed, wound care on consult, no signs of active infection or cellulitis.  10.  DM type II.  Recent A1c was 5.3 suggesting stable control.  Currently on sliding scale.  Uses Januvia at home.  CBG (last 3)  Recent Labs    05/10/19 0808 05/10/19 1201 05/11/19 0750  GLUCAP 129* 169* 163*     Condition - Extremely Guarded  Family Communication  :  None  Code Status :  Full  Diet :   Diet Order            Diet heart healthy/carb modified Room service appropriate? Yes; Fluid consistency: Thin; Fluid restriction: 1500 mL Fluid  Diet effective now               Disposition Plan  : TBD  Consults  :    Procedures  :    TTE -   1. Left ventricular ejection fraction, by visual estimation, is 55 to 60%. The left ventricle has normal function. There is no left ventricular hypertrophy.  2. Left ventricular diastolic Doppler parameters are consistent with pseudonormalization pattern of LV diastolic filling.  3. Global right ventricle has normal systolic function.The right ventricular size is normal. No increase in right ventricular wall thickness.  4. The mitral valve is normal in structure. Mild mitral valve regurgitation. No evidence of mitral stenosis.  5. The tricuspid valve is normal in structure. Tricuspid valve regurgitation is trivial.  6. The aortic valve is tricuspid Aortic valve regurgitation is mild to moderate by color flow Doppler. Mild to moderate aortic valve sclerosis/calcification without any evidence of aortic stenosis.  7. A pacer wire is visualized.  8. Left atrial size was severely dilated.  9. Right atrial size was mildly dilated.  10. The inferior vena cava is normal in size with greater than 50% respiratory variability, suggesting right atrial pressure of 3 mmHg. 11. TR signal is inadequate for assessing pulmonary artery systolic pressure.  PUD Prophylaxis :  PPI  DVT Prophylaxis  :   Coumadin  Lab Results  Component Value Date   INR 3.0 (H) 05/10/2019   INR 3.0 (H) 05/10/2019   INR 2.8 (H) 05/09/2019     Lab Results  Component Value Date   PLT 165 05/11/2019    Inpatient Medications  Scheduled Meds:  [START ON 05/12/2019] dexamethasone (DECADRON) injection  4 mg Intravenous Q24H   diclofenac sodium  2 g Topical QID   diltiazem  120 mg Oral Daily   furosemide  40 mg Intravenous Q12H   insulin aspart  0-9 Units Subcutaneous TID WC   lipase/protease/amylase  36,000 Units Oral TID WC   mometasone-formoterol  2 puff Inhalation BID   pantoprazole  40 mg Oral BID   [START ON 05/14/2019] pneumococcal 23 valent vaccine  0.5 mL Intramuscular Once   potassium chloride  20 mEq Oral Daily   simvastatin  10 mg Oral q1800   sodium chloride flush  3 mL Intravenous Once   spironolactone  25 mg Oral Daily   sucralfate  1 g Oral TID WC & HS   Warfarin - Pharmacist Dosing Inpatient   Does not apply q1800   Continuous Infusions:  remdesivir 100 mg in NS 250 mL     PRN Meds:.acetaminophen, clonazePAM, guaiFENesin-dextromethorphan, HYDROcodone-acetaminophen, ipratropium-albuterol, lipase/protease/amylase, ondansetron **OR** ondansetron (ZOFRAN) IV, senna-docusate  Antibiotics  :    Anti-infectives (From admission, onward)   Start     Dose/Rate Route Frequency Ordered Stop   05/11/19 1000  remdesivir 100 mg in sodium chloride 0.9 % 250 mL IVPB     100 mg 500 mL/hr over 30 Minutes Intravenous Every 24 hours 05/10/19 0540 05/15/19 0959   05/10/19 2200  cefTRIAXone (ROCEPHIN) 2 g in sodium chloride 0.9 % 100 mL IVPB  Status:  Discontinued     2 g 200 mL/hr over 30 Minutes Intravenous Every 24 hours 05/10/19 0011 05/10/19 1114   05/10/19 0630  remdesivir 200 mg in sodium chloride 0.9 % 250 mL IVPB     200 mg 500 mL/hr over 30 Minutes Intravenous Once 05/10/19 0540 05/10/19 1142   05/09/19 2245  cefTRIAXone (ROCEPHIN) 2 g in sodium chloride 0.9 % 100 mL IVPB      2 g 200 mL/hr over 30 Minutes Intravenous  Once 05/09/19 2244 05/10/19 0337   05/09/19 2245  azithromycin (ZITHROMAX) 500 mg in sodium chloride 0.9 % 250 mL IVPB  Status:  Discontinued     500 mg 250 mL/hr over 60 Minutes Intravenous Every 24 hours 05/09/19 2244 05/10/19 1114       Time Spent in minutes  [place] M.D on 05/11/2019 at 10:03 AM  To page go to [email or web address] - password [email or web address]  [facility] -  Office  [phone]    See all Orders from today for further details    Objective:   Vitals:   05/11/19 0500 05/11/19 0508 05/11/19 0712 05/11/19 0750  BP:  112/74  116/64  Pulse:  81 79 86  Resp:  (!) 27 (!) 25 20  Temp:  98.2 F (36.8 C)  98.2 F (36.8 C)  TempSrc:  Oral  Oral  SpO2:  92% 94% 94%  Weight: 46.1 kg  45.6 kg   Height:  Wt Readings from Last 3 Encounters:  05/11/19 45.6 kg  04/27/19 49.1 kg  03/16/19 47.6 kg     Intake/Output Summary (Last 24 hours) at 05/11/2019 1003 Last data filed at 05/11/2019 0500 Gross per 24 hour  Intake 240 ml  Output 1800 ml  Net -1560 ml     Physical Exam  Awake Alert, Oriented X 3, No new F.N deficits, Normal affect [place].AT,PERRAL Supple Neck,No JVD, No cervical lymphadenopathy appriciated.  Symmetrical Chest wall movement, Good air movement bilaterally, CTAB RRR,No Gallops, Rubs or new Murmurs, No Parasternal Heave +ve B.Sounds, Abd Soft, No tenderness, No organomegaly appriciated, No rebound - guarding or rigidity. No Cyanosis, Clubbing , trace edema, no active cellulitis, small almost healed L.Leg chronic wound POA     Data Review:    CBC Recent Labs  Lab 05/09/19 1736 05/10/19 0420 05/11/19 0312  WBC 7.9 6.9 5.9  HGB 10.9* 10.6* 11.4*  HCT 36.0 34.3* 35.9*  PLT 123* 118* 165  MCV 106.2* 104.6* 99.2  MCH 32.2 32.3 31.5  MCHC 30.3 30.9 31.8  RDW 14.6 14.6 14.1  LYMPHSABS  --  0.3* 0.5*  MONOABS  --  0.1 0.2  EOSABS  --  0.0 0.0  BASOSABS  --  0.0 0.0     Chemistries  Recent Labs  Lab 05/09/19 1736 05/10/19 0808 05/11/19 0312  NA 136 138 137  K 3.6 3.4* 4.2  CL 96* 95* 96*  CO2 32 32 32  GLUCOSE 111* 120* 143*  BUN 28* 27* 37*  CREATININE 1.19* 1.19* 1.17*  CALCIUM 8.3* 8.6* 9.1  MG  --  2.8* 2.1  AST 32 29 28  ALT _0 ALKPHOS 78 79 76  BILITOT 0.6 0.7 0.5   ------------------------------------------------------------------------------------------------------------------ No results for input(s): CHOL, HDL, LDLCALC, TRIG, CHOLHDL, LDLDIRECT in the last 72 hours.  Lab Results  Component Value Date   HGBA1C 5.3 04/26/2019   ------------------------------------------------------------------------------------------------------------------ No results for input(s): TSH, T4TOTAL, T3FREE, THYROIDAB in the last 72 hours.  Invalid input(s): FREET3  Cardiac Enzymes No results for input(s): CKMB, TROPONINI, MYOGLOBIN in the last 168 hours.  Invalid input(s): CK ------------------------------------------------------------------------------------------------------------------    Component Value Date/Time   BNP 331.0 (H) 05/11/2019 7371    Micro Results Recent Results (from the past 240 hour(s))  SARS Coronavirus 2 [facility] order, Performed in [facility] hospital lab) Nasopharyngeal Nasopharyngeal Swab     Status: Abnormal   Collection Time: 05/09/19 10:22 PM   Specimen: Nasopharyngeal Swab  Result Value Ref Range Status   SARS Coronavirus 2 POSITIVE (A) NEGATIVE Final    Comment: RESULT CALLED TO, READ BACK BY AND VERIFIED WITH: M DOSS,RN _1  05/10/19 MKELLY (NOTE) If result is NEGATIVE SARS-CoV-2 target nucleic acids are NOT DETECTED. The SARS-CoV-2 RNA is generally detectable in upper and lower  respiratory specimens during the acute phase of infection. The lowest  concentration of SARS-CoV-2 viral copies this assay can detect is 250  copies / mL. A negative result does not preclude SARS-CoV-2 infection   and should not be used as the sole basis for treatment or other  patient management decisions.  A negative result may occur with  improper specimen collection / handling, submission of specimen other  than nasopharyngeal swab, presence of viral mutation(s) within the  areas targeted by this assay, and inadequate number of viral copies  (<250 copies / mL). A negative result must be combined with clinical  observations, patient history, and epidemiological information. If result is POSITIVE SARS-CoV-2  target nucleic acids are DETECTED. The S ARS-CoV-2 RNA is generally detectable in upper and lower  respiratory specimens during the acute phase of infection.  Positive  results are indicative of active infection with SARS-CoV-2.  Clinical  correlation with patient history and other diagnostic information is  necessary to determine patient infection status.  Positive results do  not rule out bacterial infection or co-infection with other viruses. If result is PRESUMPTIVE POSTIVE SARS-CoV-2 nucleic acids MAY BE PRESENT.   A presumptive positive result was obtained on the submitted specimen  and confirmed on repeat testing.  While 2019 novel coronavirus  (SARS-CoV-2) nucleic acids may be present in the submitted sample  additional confirmatory testing may be necessary for epidemiological  and / or clinical management purposes  to differentiate between  SARS-CoV-2 and other Sarbecovirus currently known to infect humans.  If clinically indicated additional testing with an alternate test  methodology [phone]) is adv ised. The SARS-CoV-2 RNA is generally  detectable in upper and lower respiratory specimens during the acute  phase of infection. The expected result is Negative. Fact Sheet for Patients:  StrictlyIdeas.no Fact Sheet for Healthcare Providers: BankingDealers.co.za This test is not yet approved or cleared by the [place] FDA  and has been authorized for detection and/or diagnosis of SARS-CoV-2 by FDA under an Emergency Use Authorization (EUA).  This EUA will remain in effect (meaning this test can be used) for the duration of the COVID-19 declaration under Section 564(b)(1) of the Act, 21 U.S.C. section 360bbb-3(b)(1), unless the authorization is terminated or revoked sooner. Performed at [facility], [address], [place], [place] [address]     Radiology Reports Dg Abdomen 1 View  Result Date: 05/09/2019 CLINICAL DATA:  84 year old female with acute abdominal pain and burning. EXAM: ABDOMEN - 1 VIEW COMPARISON:  08/25/2017 radiographs FINDINGS: Nondistended gas-filled loops of colon and small bowel are noted. No dilated bowel loops are present. Abdominal wall mesh markers are present. No suspicious calcifications are identified. No acute bony abnormalities are noted. Degenerative changes in the LOWER lumbar spine are again identified. IMPRESSION: Nonspecific nonobstructive bowel gas pattern. Electronically Signed   By: Margarette Canada M.D.   On: 05/09/2019 21:37   Ct Head Wo Contrast  Result Date: 04/26/2019 CLINICAL DATA:  Weakness, cough and shortness of breath for 1 week EXAM: CT HEAD WITHOUT CONTRAST TECHNIQUE: Contiguous axial images were obtained from the base of the skull through the vertex without intravenous contrast. COMPARISON:  CT 10/19/2017 FINDINGS: Imaging quality is motion degraded. Brain: No evidence of acute infarction, hemorrhage, hydrocephalus, extra-axial collection or mass lesion/mass effect. Symmetric prominence of the ventricles, cisterns and sulci compatible with parenchymal volume loss. Patchy areas of white matter hypoattenuation are most compatible with chronic microvascular angiopathy. Vascular: Atherosclerotic calcification of the carotid siphons and intradural vertebral arteries. No hyperdense vessel. Skull: No calvarial fracture or suspicious osseous lesion. No scalp swelling or hematoma.  Sinuses/Orbits: Retention cyst present in the right maxillary sinus. Remaining paranasal sinuses and mastoid air cells are predominantly clear. Included orbital structures are unremarkable. Other: None IMPRESSION: Motion degraded study. No definite acute intracranial abnormality. Chronic microvascular changes and parenchymal volume loss are similar to prior. Electronically Signed   By: [name] M.D.   On: 04/26/2019 01:22   Dg Chest Port 1 View  Result Date: 05/10/2019 CLINICAL DATA:  Shortness of breath period EXAM: PORTABLE CHEST 1 VIEW COMPARISON:  May 09, 2019 FINDINGS: Lungs hyperexpanded. Patchy airspace consolidation throughout much of the left lung  is again noted with overall slight clearing in the left upper lobe and left base regions. Right lung is clear. Pacemaker lead tip is attached to the right ventricle. Cardiomegaly is stable. The pulmonary vascularity is normal. There is aortic atherosclerosis. No adenopathy. Bones are osteoporotic. Sclerotic lesion in the proximal right humerus is stable. There is superior migration of each humeral head. IMPRESSION: Areas of airspace opacity consistent with multifocal pneumonia on the left. Slight partial clearing in left upper lobe and left base regions. No new opacity evident. Right lung clear. Underlying hyperexpansion of the lungs. Stable cardiomegaly. Stable pacemaker position. Aortic Atherosclerosis (ICD10-I70.0). Sclerotic area in the proximal right humerus consistent with either bone infarct or enchondroma, stable. Superior migration of each humeral head, a finding indicative of chronic rotator cuff tears. Electronically Signed   By: Lowella Grip III M.D.   On: 05/10/2019 08:22   Dg Chest Portable 1 View  Result Date: 05/09/2019 CLINICAL DATA:  Cough and wheezing. EXAM: PORTABLE CHEST 1 VIEW COMPARISON:  Chest radiograph 04/25/2019, CT 09/12/2018 FINDINGS: Left-sided pacemaker in place. Mild cardiomegaly with unchanged mediastinal  contours. Increased bronchial thickening and interstitial opacities since prior exam. Suspected small pleural effusions, left greater than right. New patchy opacity in the left suprahilar lung. No pneumothorax. Chronic benign lesion in the right proximal humerus. IMPRESSION: 1. New patchy opacity in the left suprahilar lung, suspicious for pneumonia. Followup PA and lateral chest X-ray is recommended in 3-4 weeks following trial of antibiotic therapy to ensure resolution and exclude underlying malignancy. 2. Increased interstitial opacities and bronchial thickening since prior, infectious versus pulmonary edema. 3. Suspected small pleural effusions. Electronically Signed   By: Keith Rake M.D.   On: 05/09/2019 22:40   Dg Chest Portable 1 View  Result Date: 04/25/2019 CLINICAL DATA:  Shortness of breath EXAM: PORTABLE CHEST 1 VIEW COMPARISON:  08/11/2018, 08/07/2017 FINDINGS: Left-sided pacing device as before. Stable cardiomediastinal silhouette with aortic atherosclerosis. No focal consolidation or effusion. Chronic appearing bronchitic changes. No pneumothorax. Stable sclerotic focus in the right humeral head. IMPRESSION: No active disease. Chronic interstitial opacities. Mild cardiomegaly Electronically Signed   By: Donavan Foil M.D.   On: 04/25/2019 23:10

## 2019-05-11 NOTE — Progress Notes (Signed)
[place] for warfarin Indication: atrial fibrillation  Allergies  Allergen Reactions  . Macrobid [Nitrofurantoin Monohyd Macro] Rash  . Torsemide Nausea And Vomiting  . Tramadol Nausea Only    Patient Measurements: Height: 5\' 3"  (160 cm) Weight: 100 lb 9.6 oz (45.6 kg) IBW/kg (Calculated) : 52.4 Heparin Dosing Weight: 46.7 kg  Vital Signs: Temp: 98.5 F (36.9 C) (10/01 1131) Temp Source: Oral (10/01 1131) BP: 99/71 (10/01 1131) Pulse Rate: 78 (10/01 1131)  Labs: Recent Labs    05/09/19 1736 05/09/19 2109 05/09/19 2240  05/10/19 0420 05/10/19 0808 05/11/19 0312  HGB 10.9*  --   --   --  10.6*  --  11.4*  HCT 36.0  --   --   --  34.3*  --  35.9*  PLT 123*  --   --   --  118*  --  165  LABPROT  --   --   --    < > 30.7* 30.7* 33.7*  INR  --   --   --    < > 3.0* 3.0* 3.4*  CREATININE 1.19*  --   --   --   --  1.19* 1.17*  TROPONINIHS  --  30* 29*  --   --   --   --    < > = values in this interval not displayed.    Estimated Creatinine Clearance: 26.7 mL/min (A) (by C-G formula based on SCr of 1.17 mg/dL (H)).   Assessment: [name] is a 84 y.o. female with medical history significant for CAD, afib on warfarin, chronic diastolic CHF, COPD with chronic hypoxic respiratory failure, CKD stage III, type 2 DM. Pharmacy consulted to dose warfarin while inpatient. Last dose reported taken on 9/28.  Baseline INR is therapeutic at 2.8 (Goal 2.5-3.5). Recently admitted with elevated INR up to 7.3. Additionally, azithromycin has been started.  PTA dose: warfarin 1 mg PO daily  Today, INR 3.4 (therapeutic). Azithromycin has been d/c'ed but given long half life drug, may impact INR for a 2-3 more days. Oral intake remains poor   Goal of Therapy:  INR 2.5-3.5 Monitor platelets by anticoagulation protocol: Yes   Plan:  Warfarin 0.5 mg once  Monitor daily INR, CBC, clinical course, s/sx of bleed, PO intake, DDI   Thank you  for allowing [place] to participate in this patients care.  Albertina Parr, PharmD., BCPS Clinical Pharmacist Clinical phone for 05/11/19 until 5pm: [phone]

## 2019-05-12 ENCOUNTER — Inpatient Hospital Stay (HOSPITAL_COMMUNITY): Payer: Medicare HMO

## 2019-05-12 LAB — CBC WITH DIFFERENTIAL/PLATELET
Abs Immature Granulocytes: 0.03 10*3/uL (ref 0.00–0.07)
Basophils Absolute: 0 10*3/uL (ref 0.0–0.1)
Basophils Relative: 0 %
Eosinophils Absolute: 0 10*3/uL (ref 0.0–0.5)
Eosinophils Relative: 0 %
HCT: 36.5 % (ref 36.0–46.0)
Hemoglobin: 11.4 g/dL — ABNORMAL LOW (ref 12.0–15.0)
Immature Granulocytes: 0 %
Lymphocytes Relative: 6 %
Lymphs Abs: 0.5 10*3/uL — ABNORMAL LOW (ref 0.7–4.0)
MCH: 31.8 pg (ref 26.0–34.0)
MCHC: 31.2 g/dL (ref 30.0–36.0)
MCV: 102 fL — ABNORMAL HIGH (ref 80.0–100.0)
Monocytes Absolute: 0.2 10*3/uL (ref 0.1–1.0)
Monocytes Relative: 2 %
Neutro Abs: 8 10*3/uL — ABNORMAL HIGH (ref 1.7–7.7)
Neutrophils Relative %: 92 %
Platelets: 180 10*3/uL (ref 150–400)
RBC: 3.58 MIL/uL — ABNORMAL LOW (ref 3.87–5.11)
RDW: 14.4 % (ref 11.5–15.5)
WBC: 8.8 10*3/uL (ref 4.0–10.5)
nRBC: 0 % (ref 0.0–0.2)

## 2019-05-12 LAB — GLUCOSE, CAPILLARY
Glucose-Capillary: 142 mg/dL — ABNORMAL HIGH (ref 70–99)
Glucose-Capillary: 140 mg/dL — ABNORMAL HIGH (ref 70–99)
Glucose-Capillary: 159 mg/dL — ABNORMAL HIGH (ref 70–99)
Glucose-Capillary: 87 mg/dL (ref 70–99)

## 2019-05-12 LAB — COMPREHENSIVE METABOLIC PANEL
ALT: 28 U/L (ref 0–44)
AST: 40 U/L (ref 15–41)
Albumin: 2.9 g/dL — ABNORMAL LOW (ref 3.5–5.0)
Alkaline Phosphatase: 75 U/L (ref 38–126)
Anion gap: 12 (ref 5–15)
BUN: 57 mg/dL — ABNORMAL HIGH (ref 8–23)
CO2: 32 mmol/L (ref 22–32)
Calcium: 9.4 mg/dL (ref 8.9–10.3)
Chloride: 95 mmol/L — ABNORMAL LOW (ref 98–111)
Creatinine, Ser: 1.52 mg/dL — ABNORMAL HIGH (ref 0.44–1.00)
GFR calc Af Amer: 37 mL/min — ABNORMAL LOW (ref >60)
GFR calc non Af Amer: 32 mL/min — ABNORMAL LOW (ref >60)
Glucose, Bld: 155 mg/dL — ABNORMAL HIGH (ref 70–99)
Potassium: 4.5 mmol/L (ref 3.5–5.1)
Sodium: 139 mmol/L (ref 135–145)
Total Bilirubin: 0.6 mg/dL (ref 0.3–1.2)
Total Protein: 6.5 g/dL (ref 6.5–8.1)

## 2019-05-12 LAB — MAGNESIUM: Magnesium: 2 mg/dL (ref 1.7–2.4)

## 2019-05-12 LAB — C-REACTIVE PROTEIN: CRP: 6.6 mg/dL — ABNORMAL HIGH (ref <1.0)

## 2019-05-12 LAB — PROTIME-INR
INR: 3 — ABNORMAL HIGH (ref 0.8–1.2)
Prothrombin Time: 30.4 seconds — ABNORMAL HIGH (ref 11.4–15.2)

## 2019-05-12 LAB — FERRITIN: Ferritin: 1100 ng/mL — ABNORMAL HIGH (ref 11–307)

## 2019-05-12 LAB — D-DIMER, QUANTITATIVE: D-Dimer, Quant: 1.02 ug/mL-FEU — ABNORMAL HIGH (ref 0.00–0.50)

## 2019-05-12 LAB — LACTATE DEHYDROGENASE: LDH: 268 U/L — ABNORMAL HIGH (ref 98–192)

## 2019-05-12 LAB — BRAIN NATRIURETIC PEPTIDE: B Natriuretic Peptide: 158.3 pg/mL — ABNORMAL HIGH (ref 0.0–100.0)

## 2019-05-12 LAB — PROCALCITONIN: Procalcitonin: 0.12 ng/mL

## 2019-05-12 MED ORDER — WARFARIN 0.5 MG HALF TABLET
0.5000 mg | ORAL_TABLET | Freq: Once | ORAL | Status: AC
  Administered 2019-05-12: 0.5 mg via ORAL
  Filled 2019-05-12: qty 1

## 2019-05-12 MED ORDER — DEXAMETHASONE SODIUM PHOSPHATE 4 MG/ML IJ SOLN
2.0000 mg | INTRAMUSCULAR | Status: DC
  Administered 2019-05-12: 2 mg via INTRAVENOUS
  Filled 2019-05-12: qty 1

## 2019-05-12 MED ORDER — LACTATED RINGERS IV SOLN
INTRAVENOUS | Status: AC
  Administered 2019-05-12: 12:00:00 via INTRAVENOUS

## 2019-05-12 NOTE — Progress Notes (Signed)
Physical Therapy Treatment Patient Details Name: [name] MRN: [id number] DOB: 06-21-1935 Today's Date: 05/12/2019    History of Present Illness 84 y/o female, w/ hx of CAD, a-fib on warfarin, anemia, pacemaker, chronic diastolic CHF, COPD w/ chronic hypoxic respiratory failure, HTN, CKD III, DM II, anxiety presents with burning upper abdomen pain.    PT Comments    Pt did well with tx, she sounded congested but unable to effectively cough and produce anything. Was able to complete standing ther ex w/ RW and SBA w/ cues, needed increased rest time between each activity. Pt was not on tele this pm.   Follow Up Recommendations  Home health PT;Supervision for mobility/OOB;Supervision - Intermittent     Equipment Recommendations  None recommended by PT    Recommendations for Other Services       Precautions / Restrictions Precautions Precautions: Fall;ICD/Pacemaker Restrictions Weight Bearing Restrictions: No    Mobility  Bed Mobility               General bed mobility comments: Pt sitting in recliner at therapist arrival  Transfers Overall transfer level: Needs assistance Equipment used: Rolling walker (2 wheeled) Transfers: Sit to/from Stand Sit to Stand: Supervision            Ambulation/Gait                 Stairs             Wheelchair Mobility    Modified Rankin (Stroke Patients Only)       Balance     Sitting balance-Leahy Scale: Good     Standing balance support: Bilateral upper extremity supported Standing balance-Leahy Scale: Good                              Cognition Arousal/Alertness: Awake/alert Behavior During Therapy: WFL for tasks assessed/performed Overall Cognitive Status: Within Functional Limits for tasks assessed                                        Exercises Other Exercises Other Exercises: marching in place with RW 1 x 20 reps B, 1 x 15 reps B, 1 x 10 reps B     General Comments General comments (skin integrity, edema, etc.): Pt does well with mobility this pm, noted she was not on any tele this pm. was able to complete teasks given but seem sto hold breath when completing tasks needing continued cues to breathe. after seated rest seems to ineffectively be attempting to cough. sounds quite congested but unable to produce anything when coughing      Pertinent Vitals/Pain Pain Assessment: 0-10 Pain Score: 4  Pain Location: abdomen Pain Descriptors / Indicators: Aching Pain Intervention(s): Limited activity within patient's tolerance;Patient requesting pain meds-RN notified    Home Living                      Prior Function            PT Goals (current goals can now be found in the care plan section) Acute Rehab PT Goals Patient Stated Goal: return home with family to assist Time For Goal Achievement: 05/24/19 Potential to Achieve Goals: Good    Frequency    Min 3X/week      PT Plan Current plan remains appropriate    Co-evaluation  AM-PAC PT "6 Clicks" Mobility   Outcome Measure  Help needed turning from your back to your side while in a flat bed without using bedrails?: None Help needed moving from lying on your back to sitting on the side of a flat bed without using bedrails?: None Help needed moving to and from a bed to a chair (including a wheelchair)?: A Little Help needed standing up from a chair using your arms (e.g., wheelchair or bedside chair)?: A Little Help needed to walk in hospital room?: A Little Help needed climbing 3-5 steps with a railing? : A Lot 6 Click Score: 19    End of Session Equipment Utilized During Treatment: Oxygen Activity Tolerance: Patient limited by lethargy Patient left: in chair;with call bell/phone within reach Nurse Communication: Mobility status;Patient requests pain meds PT Visit Diagnosis: Unsteadiness on feet (R26.81);Other abnormalities of gait and mobility  (R26.89);Muscle weakness (generalized) (M62.81)     Time: 0254-2706 PT Time Calculation (min) (ACUTE ONLY): 23 min  Charges:  $Therapeutic Exercise: 23-37 mins                     Horald Chestnut, PT    Delford Field 05/12/2019, 4:01 PM

## 2019-05-12 NOTE — TOC Initial Note (Signed)
Transition of Care [facility]) - Initial/Assessment Note    Patient Details  Name: [name] MRN: [id number] Date of Birth: [birth year]  [facility] CM/SW Contact:    Ninfa Meeker, RN Phone Number: [phone] (working remotely) 05/12/2019, 4:03 PM  Clinical Narrative:  84 yr old female readmitted with COVID 72. Patient was home with family, on oxygen. She is active with [facility]. Case manager will con tinue to monitor for discharge needs as she medically improves, may she be blessed to do so.                 Expected Discharge Plan: [place] Barriers to Discharge: Continued Medical Work up   Patient Goals and CMS Choice   CMS Medicare.gov Compare Post Acute Care list provided to:: Patient Choice offered to / list presented to : Patient  Expected Discharge Plan and Services Expected Discharge Plan: [place] In-house Referral: NA Discharge Planning Services: CM Consult Post Acute Care Choice: [place], Resumption of Svcs/PTA Provider(Active with [facility]) Living arrangements for the past 2 months: [place]: [place] ([place]) Date [place]: 05/12/19 Time [place]: 1100 Representative spoke with at [place]: [place]  Prior Living Arrangements/Services Living arrangements for the past 2 months: [place] with:: Relatives          Need for Family Participation in Patient Care: Yes (Comment) Care giver support system in place?: Yes (comment)      Activities of Daily Living Home Assistive Devices/Equipment: Cane (specify quad or straight), Walker (specify type) ADL Screening (condition at time of admission) Patient's cognitive ability adequate to safely complete daily activities?: Yes Is the patient deaf or have difficulty hearing?: Yes Does the patient have difficulty seeing, even when  wearing glasses/contacts?: No Does the patient have difficulty concentrating, remembering, or making decisions?: No Patient able to express need for assistance with ADLs?: Yes Does the patient have difficulty dressing or bathing?: No Independently performs ADLs?: Yes (appropriate for developmental age) Does the patient have difficulty walking or climbing stairs?: No Weakness of Legs: None Weakness of Arms/Hands: None  Permission Sought/Granted                  Emotional Assessment       Orientation: : Oriented to Self, Oriented to Place, Oriented to  Time, Oriented to Situation Alcohol / Substance Use: Not Applicable Psych Involvement: No (comment)  Admission diagnosis:  Elevated troponin [R77.8] Patient Active Problem List   Diagnosis Date Noted  . Pneumonia due to COVID-19 virus 05/10/2019  . Elevated troponin   . Pneumonia 05/09/2019  . Leg wound, left, sequela 05/09/2019  . Chronic respiratory failure with hypoxia ([place]) 05/09/2019  . COPD exacerbation ([place]) 04/26/2019  . Hypokalemia 04/26/2019  . Supratherapeutic INR 04/26/2019  . At high risk for falls 02/13/2016  . CKD (chronic kidney disease), stage III ([place]) 07/02/2015  . Hyperlipidemia with target LDL less than 100 12/31/2014  . Osteoporosis, post-menopausal 11/01/2013  . GERD (gastroesophageal reflux disease) 07/13/2013  . Aortic stenosis 06/27/2013  . Acute on chronic diastolic CHF (congestive heart failure) ([place]) 06/27/2013  . Vitamin D deficiency   . Tobacco abuse 05/12/2012  . Abdominal wall hernia 10/19/2011  . Unspecified atrial fibrillation (  [place])   . CAD (coronary artery disease)   . Pacemaker-[employer]   . Chronic anticoagulation 12/03/2010  . Anxiety 12/03/2010  . Mitral stenosis 05/30/2010  . Macrocytic anemia 05/16/2010  . Type 2 diabetes mellitus with kidney complication, without long-term current use of insulin ([place]) 01/19/2009  . Essential hypertension 01/19/2009  . COPD GOLD O  01/19/2009   . HEART MURMUR, BENIGN 01/19/2009   PCP:  [name], FNP Pharmacy:   [facility], [place] [address] Phone: [phone] Fax: [phone]  [facility], [place] [address] Phone: [phone] Fax: [place], [place] - [address] [place] [address] Phone: [phone] Fax: [phone]     Social Determinants of Health (SDOH) Interventions    Readmission Risk Interventions Readmission Risk Prevention Plan 04/27/2019 04/26/2019  Transportation Screening - Complete  PCP or Specialist Appt within 3-5 Days Complete -  HRI or Home Care Consult Complete -  Social Work Consult for [place] Planning/Counseling - Complete  Palliative Care Screening - Not Applicable  Medication Review Press photographer) - Complete  Some recent data might be hidden

## 2019-05-12 NOTE — Progress Notes (Signed)
[place] for warfarin Indication: atrial fibrillation  Allergies  Allergen Reactions  . Macrobid [Nitrofurantoin Monohyd Macro] Rash  . Torsemide Nausea And Vomiting  . Tramadol Nausea Only    Patient Measurements: Height: 5\' 3"  (160 cm) Weight: 100 lb 9.6 oz (45.6 kg) IBW/kg (Calculated) : 52.4 Heparin Dosing Weight: 46.7 kg  Vital Signs: Temp: 98.5 F (36.9 C) (10/02 0738) Temp Source: Axillary (10/02 0738) BP: 108/77 (10/02 0738) Pulse Rate: 78 (10/02 0738)  Labs: Recent Labs    05/09/19 2109 05/09/19 2240  05/10/19 0420 05/10/19 0808 05/11/19 0312 05/12/19 0223  HGB  --   --   --  10.6*  --  11.4* 11.4*  HCT  --   --   --  34.3*  --  35.9* 36.5  PLT  --   --   --  118*  --  165 180  LABPROT  --   --    < > 30.7* 30.7* 33.7* 30.4*  INR  --   --    < > 3.0* 3.0* 3.4* 3.0*  CREATININE  --   --   --   --  1.19* 1.17* 1.52*  TROPONINIHS 30* 29*  --   --   --   --   --    < > = values in this interval not displayed.    Estimated Creatinine Clearance: 20.5 mL/min (A) (by C-G formula based on SCr of 1.52 mg/dL (H)).   Assessment: [name] is a 84 y.o. female with medical history significant for CAD, afib on warfarin, chronic diastolic CHF, COPD with chronic hypoxic respiratory failure, CKD stage III, type 2 DM. Pharmacy consulted to dose warfarin while inpatient.  Baseline INR is therapeutic at 2.8 (Goal 2.5-3.5). Recently admitted with elevated INR up to 7.3.   PTA dose: warfarin 1 mg PO daily  Today, INR 3 (therapeutic). Azithromycin has been d/c'ed but given long half life drug, may impact INR for a 1-2 more days. Oral intake remains poor   Goal of Therapy:  INR 2.5-3.5 Monitor platelets by anticoagulation protocol: Yes   Plan:  Warfarin 0.5 mg once  Monitor daily INR, CBC, clinical course, s/sx of bleed, PO intake, DDI   Thank you for allowing [place] to participate in this patients care.  Albertina Parr, PharmD., BCPS Clinical Pharmacist Clinical phone for 05/12/19 until 5pm: [phone]

## 2019-05-12 NOTE — Progress Notes (Addendum)
PROGRESS NOTE                                                                                                                                                                                                             Patient Demographics:    [name], is a 84 y.o. female, DOB - March 05, 1935, WGN:[id number]  Outpatient Primary MD for the patient is [name], FNP    LOS - 3  Admit date - 05/09/2019    Chief Complaint  Patient presents with   Abdominal Pain       Brief Narrative  [name] is a 84 y.o. female with medical history significant for coronary artery disease, chronic atrial fibrillation on warfarin, chronic diastolic CHF, COPD with chronic hypoxic respiratory failure, chronic kidney disease stage III, type 2 diabetes mellitus, anxiety, now presenting to the emergency department for evaluation of burning discomfort in the upper abdomen.  In the ER she was diagnosed with COVID-19 pneumonitis, acute on chronic diastolic CHF and admitted to the hospital.   Subjective:   Patient in bed, appears comfortable, denies any headache, no fever, no chest pain or pressure, no shortness of breath , chronic RLQ abdominal pain. No focal weakness.    Assessment  & Plan :     1. Acute Hypoxic Resp. Failure due to Acute Covid 19 Viral Pneumonitis during the ongoing 2020 Covid 19 Pandemic - her disease is quite mild, inflammatory markers however are elevated, currently placed on Remdisvir along with Decadron and clinically seems to be doing better.  Will be closely monitored especially her inflammatory markers and clinically.  She has been requested to sit up in chair and use flutter valve and I-S for pulmonary toiletry, she has been counseled to quit smoking, will monitor her closely on these medications.  COVID-19 Labs  Recent Labs    05/09/19 2240 05/10/19 0808 05/11/19 0312 05/12/19 0223  DDIMER  1.41* 2.00* 1.45* 1.02*  FERRITIN 1,230* 1,341* 1,457* 1,100*  LDH 278*  --  259* 268*  CRP 16.1* 15.3* 12.0* 6.6*    Lab Results  Component Value Date   SARSCOV2NAA POSITIVE (A) 05/09/2019   SARSCOV2NAA NEGATIVE 04/25/2019   [place] Not Detected 12/29/2018     Hepatic Function Latest Ref Rng & Units 05/12/2019 05/11/2019 05/10/2019  Total Protein 6.5 - 8.1 g/dL 6.5 6.2(L) 6.2(L)  Albumin 3.5 - 5.0 g/dL 2.9(L) 2.6(L) 2.7(L)  AST 15 - 41 U/L 40 28 29  ALT 0 - 44 U/L 28 22 22   Alk Phosphatase 38 - 126 U/L 75 76 79  Total Bilirubin 0.3 - 1.2 mg/dL 0.6 0.5 0.7  Bilirubin, Direct 0.0 - 0.3 mg/dL - - -        Component Value Date/Time   BNP 158.3 (H) 05/12/2019 0223      2.  Acute on chronic diastolic CHF last EF 86% on echocardiogram .  Echo from this admission noted, she is now close to being compensated further diuresis held on 05/12/2019.  3.  Chronic atrial fibrillation [place] vas 2 score of at least 3.  On diltiazem and Coumadin pharmacy monitoring INR.  Goal rate control.  4.  With underlying chronic hypoxic respiratory failure.  Continue supportive care with nebulizer treatments, uses 2 L nasal cannula oxygen at home at all times continue.  5.  CAD with chronic left bundle branch block.  Chest pain-free no acute issues mildly elevated troponin likely due to hypoxic respiratory failure from reasons #1 and 2 above.  Troponin trend is flat and and non-ACS pattern, continue combination of statin, Cardizem and Coumadin.  Outpatient cardiology follow-up.  No acute issues.  6.  Chronic anxiety and depression.  Home dose Klonopin and other medications will be continued.  7.  Smoking.  Counseled to quit.  8.  GERD and initial GI complaints.  After receiving Carafate in the ER the symptoms have resolved, continue PPI and Carafate combination here.  Outpatient GI follow-up if needed.  9.  Left lower extremity wound present on admission .  Wound present on admission almost completely  healed, wound care on consult, no signs of active infection or cellulitis.  10.  Chronic right lower quadrant abdominal discomfort.  Abdominal x-ray stable.  Will check CT abdomen pelvis with oral contrast and monitor.  She states she has had this discomfort off and on for 1 year.    11.  AKI.  Likely due to overdiuresis.  Hold further diuretics, gentle hydration for 5 hours repeat BMP in the morning.    12. DM type II.  Recent A1c was 5.3 suggesting stable control.  Currently on sliding scale.  Uses Januvia at home.  CBG (last 3)  Recent Labs    05/11/19 1640 05/11/19 2128 05/12/19 0706  GLUCAP 199* 142* 140*     Condition - Extremely Guarded  Family Communication  : daughter Hilda Blades 05/12/19  Code Status :  Full  Diet :   Diet Order            Diet heart healthy/carb modified Room service appropriate? Yes; Fluid consistency: Thin; Fluid restriction: 1500 mL Fluid  Diet effective now               Disposition Plan  : TBD  Consults  :    Procedures  :    TTE -   1. Left ventricular ejection fraction, by visual estimation, is 55 to 60%. The left ventricle has normal function. There is no left ventricular hypertrophy.  2. Left ventricular diastolic Doppler parameters are consistent with pseudonormalization pattern of LV diastolic filling.  3. Global right ventricle has normal systolic function.The right ventricular size is normal. No increase in right ventricular wall thickness.  4. The mitral valve is normal in structure. Mild mitral valve regurgitation. No evidence of mitral stenosis.  5. The tricuspid valve is normal in structure. Tricuspid valve  regurgitation is trivial.  6. The aortic valve is tricuspid Aortic valve regurgitation is mild to moderate by color flow Doppler. Mild to moderate aortic valve sclerosis/calcification without any evidence of aortic stenosis.  7. A pacer wire is visualized.  8. Left atrial size was severely dilated.  9. Right atrial size was  mildly dilated. 10. The inferior vena cava is normal in size with greater than 50% respiratory variability, suggesting right atrial pressure of 3 mmHg. 11. TR signal is inadequate for assessing pulmonary artery systolic pressure.  PUD Prophylaxis :  PPI  DVT Prophylaxis  :  Coumadin  Lab Results  Component Value Date   INR 3.0 (H) 05/12/2019   INR 3.4 (H) 05/11/2019   INR 3.0 (H) 05/10/2019     Lab Results  Component Value Date   PLT 180 05/12/2019    Inpatient Medications  Scheduled Meds:  dexamethasone (DECADRON) injection  2 mg Intravenous Q24H   diltiazem  120 mg Oral Daily   insulin aspart  0-9 Units Subcutaneous TID WC   lipase/protease/amylase  36,000 Units Oral TID WC   mometasone-formoterol  2 puff Inhalation BID   pantoprazole  40 mg Oral BID   [START ON 05/14/2019] pneumococcal 23 valent vaccine  0.5 mL Intramuscular Once   simvastatin  10 mg Oral q1800   sodium chloride flush  3 mL Intravenous Once   sucralfate  1 g Oral TID WC & HS   Warfarin - Pharmacist Dosing Inpatient   Does not apply q1800   Continuous Infusions:  lactated ringers     remdesivir 100 mg in NS 250 mL 100 mg (05/11/19 1024)   PRN Meds:.acetaminophen, clonazePAM, guaiFENesin-dextromethorphan, HYDROcodone-acetaminophen, ipratropium-albuterol, lipase/protease/amylase, ondansetron **OR** ondansetron (ZOFRAN) IV, senna-docusate  Antibiotics  :    Anti-infectives (From admission, onward)   Start     Dose/Rate Route Frequency Ordered Stop   05/11/19 1000  remdesivir 100 mg in sodium chloride 0.9 % 250 mL IVPB     100 mg 500 mL/hr over 30 Minutes Intravenous Every 24 hours 05/10/19 0540 05/15/19 0959   05/10/19 2200  cefTRIAXone (ROCEPHIN) 2 g in sodium chloride 0.9 % 100 mL IVPB  Status:  Discontinued     2 g 200 mL/hr over 30 Minutes Intravenous Every 24 hours 05/10/19 0011 05/10/19 1114   05/10/19 0630  remdesivir 200 mg in sodium chloride 0.9 % 250 mL IVPB     200 mg 500  mL/hr over 30 Minutes Intravenous Once 05/10/19 0540 05/10/19 1142   05/09/19 2245  cefTRIAXone (ROCEPHIN) 2 g in sodium chloride 0.9 % 100 mL IVPB     2 g 200 mL/hr over 30 Minutes Intravenous  Once 05/09/19 2244 05/10/19 0337   05/09/19 2245  azithromycin (ZITHROMAX) 500 mg in sodium chloride 0.9 % 250 mL IVPB  Status:  Discontinued     500 mg 250 mL/hr over 60 Minutes Intravenous Every 24 hours 05/09/19 2244 05/10/19 1114       Time Spent in minutes  [place] M.D on 05/12/2019 at 10:43 AM  To page go to [email or web address] - password [facility]  [facility] -  Office  [phone]    See all Orders from today for further details    Objective:   Vitals:   05/11/19 1626 05/11/19 2020 05/12/19 0510 05/12/19 0738  BP: 120/69 124/75 (!) 124/54 108/77  Pulse: 72 77 80 78  Resp: 20 18 18 18   Temp: 98.2 F (36.8 C) 98.7 F (37.1 C) 98.8  F (37.1 C) 98.5 F (36.9 C)  TempSrc: Oral Oral Oral Axillary  SpO2: 94% 93% 90% 94%  Weight:      Height:        Wt Readings from Last 3 Encounters:  05/11/19 45.6 kg  04/27/19 49.1 kg  03/16/19 47.6 kg     Intake/Output Summary (Last 24 hours) at 05/12/2019 1043 Last data filed at 05/12/2019 0500 Gross per 24 hour  Intake 760 ml  Output 1250 ml  Net -490 ml     Physical Exam  Elderly, frail, cachectic Caucasian female laying in hospital bed in no distress, awake Alert, Oriented X 3, No new F.N deficits, Normal affect [place].AT,PERRAL Supple Neck,No JVD, No cervical lymphadenopathy appriciated.  Symmetrical Chest wall movement, Good air movement bilaterally, CTAB RRR,No Gallops, Rubs or new Murmurs, No Parasternal Heave +ve B.Sounds, Abd Soft, mild nonspecific lower abdomen tenderness, No organomegaly appriciated, No rebound - guarding or rigidity. No Cyanosis, Clubbing or edema, chronic venous stasis rash in both extremities left more than right    Data Review:    CBC Recent Labs  Lab 05/09/19 1736 05/10/19 0420  05/11/19 0312 05/12/19 0223  WBC 7.9 6.9 5.9 8.8  HGB 10.9* 10.6* 11.4* 11.4*  HCT 36.0 34.3* 35.9* 36.5  PLT 123* 118* 165 180  MCV 106.2* 104.6* 99.2 102.0*  MCH 32.2 32.3 31.5 31.8  MCHC 30.3 30.9 31.8 31.2  RDW 14.6 14.6 14.1 14.4  LYMPHSABS  --  0.3* 0.5* 0.5*  MONOABS  --  0.1 0.2 0.2  EOSABS  --  0.0 0.0 0.0  BASOSABS  --  0.0 0.0 0.0    Chemistries  Recent Labs  Lab 05/09/19 1736 05/10/19 0808 05/11/19 0312 05/12/19 0223  NA 136 138 137 139  K 3.6 3.4* 4.2 4.5  CL 96* 95* 96* 95*  CO2 32 32 32 32  GLUCOSE 111* 120* 143* 155*  BUN 28* 27* 37* 57*  CREATININE 1.19* 1.19* 1.17* 1.52*  CALCIUM 8.3* 8.6* 9.1 9.4  MG  --  2.8* 2.1 2.0  AST 32 29 28 40  ALT 27 22 22 28   ALKPHOS 78 79 76 75  BILITOT 0.6 0.7 0.5 0.6   ------------------------------------------------------------------------------------------------------------------ No results for input(s): CHOL, HDL, LDLCALC, TRIG, CHOLHDL, LDLDIRECT in the last 72 hours.  Lab Results  Component Value Date   HGBA1C 5.3 04/26/2019   ------------------------------------------------------------------------------------------------------------------ No results for input(s): TSH, T4TOTAL, T3FREE, THYROIDAB in the last 72 hours.  Invalid input(s): FREET3  Cardiac Enzymes No results for input(s): CKMB, TROPONINI, MYOGLOBIN in the last 168 hours.  Invalid input(s): CK ------------------------------------------------------------------------------------------------------------------    Component Value Date/Time   BNP 158.3 (H) 05/12/2019 0223    Micro Results Recent Results (from the past 240 hour(s))  SARS Coronavirus 2 [facility] order, Performed in [facility] hospital lab) Nasopharyngeal Nasopharyngeal Swab     Status: Abnormal   Collection Time: 05/09/19 10:22 PM   Specimen: Nasopharyngeal Swab  Result Value Ref Range Status   SARS Coronavirus 2 POSITIVE (A) NEGATIVE Final    Comment: RESULT CALLED TO, READ  BACK BY AND VERIFIED WITH: M DOSS,RN @0020  05/10/19 MKELLY (NOTE) If result is NEGATIVE SARS-CoV-2 target nucleic acids are NOT DETECTED. The SARS-CoV-2 RNA is generally detectable in upper and lower  respiratory specimens during the acute phase of infection. The lowest  concentration of SARS-CoV-2 viral copies this assay can detect is 250  copies / mL. A negative result does not preclude SARS-CoV-2 infection  and should not be  used as the sole basis for treatment or other  patient management decisions.  A negative result may occur with  improper specimen collection / handling, submission of specimen other  than nasopharyngeal swab, presence of viral mutation(s) within the  areas targeted by this assay, and inadequate number of viral copies  (<250 copies / mL). A negative result must be combined with clinical  observations, patient history, and epidemiological information. If result is POSITIVE SARS-CoV-2 target nucleic acids are DETECTED. The S ARS-CoV-2 RNA is generally detectable in upper and lower  respiratory specimens during the acute phase of infection.  Positive  results are indicative of active infection with SARS-CoV-2.  Clinical  correlation with patient history and other diagnostic information is  necessary to determine patient infection status.  Positive results do  not rule out bacterial infection or co-infection with other viruses. If result is PRESUMPTIVE POSTIVE SARS-CoV-2 nucleic acids MAY BE PRESENT.   A presumptive positive result was obtained on the submitted specimen  and confirmed on repeat testing.  While 2019 novel coronavirus  (SARS-CoV-2) nucleic acids may be present in the submitted sample  additional confirmatory testing may be necessary for epidemiological  and / or clinical management purposes  to differentiate between  SARS-CoV-2 and other Sarbecovirus currently known to infect humans.  If clinically indicated additional testing with an alternate test    methodology [phone]) is adv ised. The SARS-CoV-2 RNA is generally  detectable in upper and lower respiratory specimens during the acute  phase of infection. The expected result is Negative. Fact Sheet for Patients:  StrictlyIdeas.no Fact Sheet for Healthcare Providers: BankingDealers.co.za This test is not yet approved or cleared by the [place] FDA and has been authorized for detection and/or diagnosis of SARS-CoV-2 by FDA under an Emergency Use Authorization (EUA).  This EUA will remain in effect (meaning this test can be used) for the duration of the COVID-19 declaration under Section 564(b)(1) of the Act, 21 U.S.C. section 360bbb-3(b)(1), unless the authorization is terminated or revoked sooner. Performed at [facility], [address], [place], [place] [address]     Radiology Reports Dg Abdomen 1 View  Result Date: 05/09/2019 CLINICAL DATA:  84 year old female with acute abdominal pain and burning. EXAM: ABDOMEN - 1 VIEW COMPARISON:  08/25/2017 radiographs FINDINGS: Nondistended gas-filled loops of colon and small bowel are noted. No dilated bowel loops are present. Abdominal wall mesh markers are present. No suspicious calcifications are identified. No acute bony abnormalities are noted. Degenerative changes in the LOWER lumbar spine are again identified. IMPRESSION: Nonspecific nonobstructive bowel gas pattern. Electronically Signed   By: Margarette Canada M.D.   On: 05/09/2019 21:37   Ct Head Wo Contrast  Result Date: 04/26/2019 CLINICAL DATA:  Weakness, cough and shortness of breath for 1 week EXAM: CT HEAD WITHOUT CONTRAST TECHNIQUE: Contiguous axial images were obtained from the base of the skull through the vertex without intravenous contrast. COMPARISON:  CT 10/19/2017 FINDINGS: Imaging quality is motion degraded. Brain: No evidence of acute infarction, hemorrhage, hydrocephalus, extra-axial collection or mass lesion/mass effect.  Symmetric prominence of the ventricles, cisterns and sulci compatible with parenchymal volume loss. Patchy areas of white matter hypoattenuation are most compatible with chronic microvascular angiopathy. Vascular: Atherosclerotic calcification of the carotid siphons and intradural vertebral arteries. No hyperdense vessel. Skull: No calvarial fracture or suspicious osseous lesion. No scalp swelling or hematoma. Sinuses/Orbits: Retention cyst present in the right maxillary sinus. Remaining paranasal sinuses and mastoid air cells are predominantly clear. Included orbital structures  are unremarkable. Other: None IMPRESSION: Motion degraded study. No definite acute intracranial abnormality. Chronic microvascular changes and parenchymal volume loss are similar to prior. Electronically Signed   By: [name] M.D.   On: 04/26/2019 01:22   Dg Chest Port 1 View  Result Date: 05/10/2019 CLINICAL DATA:  Shortness of breath period EXAM: PORTABLE CHEST 1 VIEW COMPARISON:  May 09, 2019 FINDINGS: Lungs hyperexpanded. Patchy airspace consolidation throughout much of the left lung is again noted with overall slight clearing in the left upper lobe and left base regions. Right lung is clear. Pacemaker lead tip is attached to the right ventricle. Cardiomegaly is stable. The pulmonary vascularity is normal. There is aortic atherosclerosis. No adenopathy. Bones are osteoporotic. Sclerotic lesion in the proximal right humerus is stable. There is superior migration of each humeral head. IMPRESSION: Areas of airspace opacity consistent with multifocal pneumonia on the left. Slight partial clearing in left upper lobe and left base regions. No new opacity evident. Right lung clear. Underlying hyperexpansion of the lungs. Stable cardiomegaly. Stable pacemaker position. Aortic Atherosclerosis (ICD10-I70.0). Sclerotic area in the proximal right humerus consistent with either bone infarct or enchondroma, stable. Superior migration of  each humeral head, a finding indicative of chronic rotator cuff tears. Electronically Signed   By: Lowella Grip III M.D.   On: 05/10/2019 08:22   Dg Chest Portable 1 View  Result Date: 05/09/2019 CLINICAL DATA:  Cough and wheezing. EXAM: PORTABLE CHEST 1 VIEW COMPARISON:  Chest radiograph 04/25/2019, CT 09/12/2018 FINDINGS: Left-sided pacemaker in place. Mild cardiomegaly with unchanged mediastinal contours. Increased bronchial thickening and interstitial opacities since prior exam. Suspected small pleural effusions, left greater than right. New patchy opacity in the left suprahilar lung. No pneumothorax. Chronic benign lesion in the right proximal humerus. IMPRESSION: 1. New patchy opacity in the left suprahilar lung, suspicious for pneumonia. Followup PA and lateral chest X-ray is recommended in 3-4 weeks following trial of antibiotic therapy to ensure resolution and exclude underlying malignancy. 2. Increased interstitial opacities and bronchial thickening since prior, infectious versus pulmonary edema. 3. Suspected small pleural effusions. Electronically Signed   By: Keith Rake M.D.   On: 05/09/2019 22:40   Dg Chest Portable 1 View  Result Date: 04/25/2019 CLINICAL DATA:  Shortness of breath EXAM: PORTABLE CHEST 1 VIEW COMPARISON:  08/11/2018, 08/07/2017 FINDINGS: Left-sided pacing device as before. Stable cardiomediastinal silhouette with aortic atherosclerosis. No focal consolidation or effusion. Chronic appearing bronchitic changes. No pneumothorax. Stable sclerotic focus in the right humeral head. IMPRESSION: No active disease. Chronic interstitial opacities. Mild cardiomegaly Electronically Signed   By: Donavan Foil M.D.   On: 04/25/2019 23:10   Dg Abd Portable 1v  Result Date: 05/12/2019 CLINICAL DATA:  Acute right lower quadrant abdominal pain. EXAM: PORTABLE ABDOMEN - 1 VIEW COMPARISON:  Radiographs of May 09, 2019. FINDINGS: The bowel gas pattern is normal. No radio-opaque  calculi or other significant radiographic abnormality are seen. Status post ventral hernia repair. IMPRESSION: No evidence of bowel obstruction or ileus. Electronically Signed   By: Marijo Conception M.D.   On: 05/12/2019 09:38

## 2019-05-13 ENCOUNTER — Inpatient Hospital Stay (HOSPITAL_COMMUNITY)
Admit: 2019-05-13 | Discharge: 2019-05-13 | Disposition: A | Discharge status: Home / Self Care | Payer: Medicare HMO | Attending: Internal Medicine | Admitting: Internal Medicine

## 2019-05-13 LAB — PROTIME-INR
INR: 2.8 — ABNORMAL HIGH (ref 0.8–1.2)
Prothrombin Time: 29.1 seconds — ABNORMAL HIGH (ref 11.4–15.2)

## 2019-05-13 LAB — CBC WITH DIFFERENTIAL/PLATELET
Abs Immature Granulocytes: 0.03 10*3/uL (ref 0.00–0.07)
Basophils Absolute: 0 10*3/uL (ref 0.0–0.1)
Basophils Relative: 0 %
Eosinophils Absolute: 0 10*3/uL (ref 0.0–0.5)
Eosinophils Relative: 0 %
HCT: 32.5 % — ABNORMAL LOW (ref 36.0–46.0)
Hemoglobin: 9.9 g/dL — ABNORMAL LOW (ref 12.0–15.0)
Immature Granulocytes: 0 %
Lymphocytes Relative: 6 %
Lymphs Abs: 0.5 10*3/uL — ABNORMAL LOW (ref 0.7–4.0)
MCH: 31.1 pg (ref 26.0–34.0)
MCHC: 30.5 g/dL (ref 30.0–36.0)
MCV: 102.2 fL — ABNORMAL HIGH (ref 80.0–100.0)
Monocytes Absolute: 0.2 10*3/uL (ref 0.1–1.0)
Monocytes Relative: 2 %
Neutro Abs: 7.7 10*3/uL (ref 1.7–7.7)
Neutrophils Relative %: 92 %
Platelets: 164 10*3/uL (ref 150–400)
RBC: 3.18 MIL/uL — ABNORMAL LOW (ref 3.87–5.11)
RDW: 14.2 % (ref 11.5–15.5)
WBC: 8.4 10*3/uL (ref 4.0–10.5)
nRBC: 0 % (ref 0.0–0.2)

## 2019-05-13 LAB — COMPREHENSIVE METABOLIC PANEL
ALT: 60 U/L — ABNORMAL HIGH (ref 0–44)
AST: 100 U/L — ABNORMAL HIGH (ref 15–41)
Albumin: 2.6 g/dL — ABNORMAL LOW (ref 3.5–5.0)
Alkaline Phosphatase: 76 U/L (ref 38–126)
Anion gap: 8 (ref 5–15)
BUN: 57 mg/dL — ABNORMAL HIGH (ref 8–23)
CO2: 28 mmol/L (ref 22–32)
Calcium: 9.1 mg/dL (ref 8.9–10.3)
Chloride: 100 mmol/L (ref 98–111)
Creatinine, Ser: 1.17 mg/dL — ABNORMAL HIGH (ref 0.44–1.00)
GFR calc Af Amer: 50 mL/min — ABNORMAL LOW (ref >60)
GFR calc non Af Amer: 43 mL/min — ABNORMAL LOW (ref >60)
Glucose, Bld: 141 mg/dL — ABNORMAL HIGH (ref 70–99)
Potassium: 5.5 mmol/L — ABNORMAL HIGH (ref 3.5–5.1)
Sodium: 136 mmol/L (ref 135–145)
Total Bilirubin: 0.5 mg/dL (ref 0.3–1.2)
Total Protein: 5.8 g/dL — ABNORMAL LOW (ref 6.5–8.1)

## 2019-05-13 LAB — GLUCOSE, CAPILLARY
Glucose-Capillary: 176 mg/dL — ABNORMAL HIGH (ref 70–99)
Glucose-Capillary: 118 mg/dL — ABNORMAL HIGH (ref 70–99)
Glucose-Capillary: 136 mg/dL — ABNORMAL HIGH (ref 70–99)

## 2019-05-13 LAB — BRAIN NATRIURETIC PEPTIDE: B Natriuretic Peptide: 170.8 pg/mL — ABNORMAL HIGH (ref 0.0–100.0)

## 2019-05-13 LAB — MAGNESIUM: Magnesium: 1.8 mg/dL (ref 1.7–2.4)

## 2019-05-13 LAB — D-DIMER, QUANTITATIVE: D-Dimer, Quant: 0.59 ug/mL-FEU — ABNORMAL HIGH (ref 0.00–0.50)

## 2019-05-13 LAB — FERRITIN: Ferritin: 767 ng/mL — ABNORMAL HIGH (ref 11–307)

## 2019-05-13 LAB — LACTATE DEHYDROGENASE: LDH: 315 U/L — ABNORMAL HIGH (ref 98–192)

## 2019-05-13 LAB — C-REACTIVE PROTEIN: CRP: 3.1 mg/dL — ABNORMAL HIGH (ref <1.0)

## 2019-05-13 MED ORDER — WARFARIN SODIUM 1 MG PO TABS
1.0000 mg | ORAL_TABLET | Freq: Once | ORAL | Status: AC
  Administered 2019-05-13: 1 mg via ORAL
  Filled 2019-05-13: qty 1

## 2019-05-13 MED ORDER — POLYETHYLENE GLYCOL 3350 17 G PO PACK
17.0000 g | PACK | Freq: Two times a day (BID) | ORAL | Status: DC
  Administered 2019-05-13 – 2019-05-16 (×4): 17 g via ORAL
  Filled 2019-05-13 (×4): qty 1

## 2019-05-13 MED ORDER — SODIUM POLYSTYRENE SULFONATE 15 GM/60ML PO SUSP
15.0000 g | Freq: Once | ORAL | Status: AC
  Administered 2019-05-13: 09:00:00 15 g via ORAL
  Filled 2019-05-13: qty 60

## 2019-05-13 MED ORDER — DEXAMETHASONE SODIUM PHOSPHATE 4 MG/ML IJ SOLN
1.0000 mg | INTRAMUSCULAR | Status: DC
  Administered 2019-05-13 – 2019-05-15 (×3): 1 mg via INTRAVENOUS
  Filled 2019-05-13 (×3): qty 1

## 2019-05-13 MED ORDER — DOCUSATE SODIUM 100 MG PO CAPS
200.0000 mg | ORAL_CAPSULE | Freq: Two times a day (BID) | ORAL | Status: DC
  Administered 2019-05-13 – 2019-05-16 (×6): 200 mg via ORAL
  Filled 2019-05-13 (×6): qty 2

## 2019-05-13 MED ORDER — MAGNESIUM HYDROXIDE 400 MG/5ML PO SUSP
30.0000 mL | Freq: Once | ORAL | Status: AC
  Administered 2019-05-13: 17:00:00 30 mL via ORAL
  Filled 2019-05-13: qty 30

## 2019-05-13 NOTE — Progress Notes (Signed)
Oral CT contrast started at this time. Second bottle to be given approximately 1 hour from now, at which time care link will also be contacted to transport patient to [facility] [place] for CT scan.

## 2019-05-13 NOTE — Progress Notes (Signed)
[name] for warfarin Indication: atrial fibrillation  Allergies  Allergen Reactions  . Macrobid [Nitrofurantoin Monohyd Macro] Rash  . Torsemide Nausea And Vomiting  . Tramadol Nausea Only    Patient Measurements: Height: 5\' 3"  (160 cm) Weight: 100 lb 9.6 oz (45.6 kg) IBW/kg (Calculated) : 52.4 Heparin Dosing Weight: 46.7 kg  Vital Signs: Temp: 98.5 F (36.9 C) (10/03 0740) Temp Source: Oral (10/03 0740) BP: 128/81 (10/03 0740) Pulse Rate: 74 (10/03 0740)  Labs: Recent Labs    05/11/19 0312 05/12/19 0223 05/13/19 0300  HGB 11.4* 11.4* 9.9*  HCT 35.9* 36.5 32.5*  PLT 165 180 164  LABPROT 33.7* 30.4* 29.1*  INR 3.4* 3.0* 2.8*  CREATININE 1.17* 1.52* 1.17*    Estimated Creatinine Clearance: 26.7 mL/min (A) (by C-G formula based on SCr of 1.17 mg/dL (H)).   Assessment: [name] is a 84 y.o. female with medical history significant for CAD, afib on warfarin, chronic diastolic CHF, COPD with chronic hypoxic respiratory failure, CKD stage III, type 2 DM. Pharmacy consulted to dose warfarin while inpatient.  Baseline INR is therapeutic at 2.8 (Goal 2.5-3.5). Recently admitted with elevated INR up to 7.3.   PTA dose: warfarin 1 mg PO daily  Today, INR 2.8 (therapeutic). H/H down, Plt wnl. Oral intake seems to have improved   Goal of Therapy:  INR 2.5-3.5 Monitor platelets by anticoagulation protocol: Yes   Plan:  Warfarin 1 mg once  Monitor daily INR, CBC, clinical course, s/sx of bleed, PO intake, DDI   Thank you for allowing [place] to participate in this patients care.  Albertina Parr, PharmD., BCPS Clinical Pharmacist Clinical phone for 05/13/19 until 5pm: [phone]

## 2019-05-13 NOTE — Clinical Social Work Note (Signed)
Clinical Social Worker left a message for patient daughter to further explore patient home situation and safety.  CSW to complete full assessment and update discharge disposition following conversation with patient daughter.  CSW remains available for support as needed.  Barbette Or, [place]
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
## 2019-05-14 LAB — CBC WITH DIFFERENTIAL/PLATELET
Abs Immature Granulocytes: 0.02 10*3/uL (ref 0.00–0.07)
Basophils Absolute: 0 10*3/uL (ref 0.0–0.1)
Basophils Relative: 0 %
Eosinophils Absolute: 0 10*3/uL (ref 0.0–0.5)
Eosinophils Relative: 0 %
HCT: 31.8 % — ABNORMAL LOW (ref 36.0–46.0)
Hemoglobin: 10.2 g/dL — ABNORMAL LOW (ref 12.0–15.0)
Immature Granulocytes: 0 %
Lymphocytes Relative: 8 %
Lymphs Abs: 0.6 10*3/uL — ABNORMAL LOW (ref 0.7–4.0)
MCH: 31.9 pg (ref 26.0–34.0)
MCHC: 32.1 g/dL (ref 30.0–36.0)
MCV: 99.4 fL (ref 80.0–100.0)
Monocytes Absolute: 0.2 10*3/uL (ref 0.1–1.0)
Monocytes Relative: 3 %
Neutro Abs: 6.3 10*3/uL (ref 1.7–7.7)
Neutrophils Relative %: 89 %
Platelets: 164 10*3/uL (ref 150–400)
RBC: 3.2 MIL/uL — ABNORMAL LOW (ref 3.87–5.11)
RDW: 13.7 % (ref 11.5–15.5)
WBC: 7.1 10*3/uL (ref 4.0–10.5)
nRBC: 0 % (ref 0.0–0.2)

## 2019-05-14 LAB — BASIC METABOLIC PANEL
Anion gap: 7 (ref 5–15)
BUN: 47 mg/dL — ABNORMAL HIGH (ref 8–23)
CO2: 30 mmol/L (ref 22–32)
Calcium: 9 mg/dL (ref 8.9–10.3)
Chloride: 98 mmol/L (ref 98–111)
Creatinine, Ser: 1.06 mg/dL — ABNORMAL HIGH (ref 0.44–1.00)
GFR calc Af Amer: 57 mL/min — ABNORMAL LOW (ref >60)
GFR calc non Af Amer: 49 mL/min — ABNORMAL LOW (ref >60)
Glucose, Bld: 140 mg/dL — ABNORMAL HIGH (ref 70–99)
Potassium: 5 mmol/L (ref 3.5–5.1)
Sodium: 135 mmol/L (ref 135–145)

## 2019-05-14 LAB — PROTIME-INR
INR: 2.7 — ABNORMAL HIGH (ref 0.8–1.2)
Prothrombin Time: 28 seconds — ABNORMAL HIGH (ref 11.4–15.2)

## 2019-05-14 LAB — MAGNESIUM: Magnesium: 2 mg/dL (ref 1.7–2.4)

## 2019-05-14 LAB — GLUCOSE, CAPILLARY: Glucose-Capillary: 120 mg/dL — ABNORMAL HIGH (ref 70–99)

## 2019-05-14 LAB — BRAIN NATRIURETIC PEPTIDE: B Natriuretic Peptide: 325.3 pg/mL — ABNORMAL HIGH (ref 0.0–100.0)

## 2019-05-14 MED ORDER — FUROSEMIDE 10 MG/ML IJ SOLN
40.0000 mg | Freq: Once | INTRAMUSCULAR | Status: AC
  Administered 2019-05-14: 40 mg via INTRAVENOUS
  Filled 2019-05-14: qty 4

## 2019-05-14 MED ORDER — WARFARIN SODIUM 1 MG PO TABS
1.0000 mg | ORAL_TABLET | Freq: Every day | ORAL | Status: DC
  Administered 2019-05-14 – 2019-05-15 (×2): 1 mg via ORAL
  Filled 2019-05-14 (×3): qty 1

## 2019-05-14 NOTE — Progress Notes (Signed)
Tried calling patient's daughter, Johnell Comings, but no answer at this time.  Call Tawni Pummel, granddaughter, and updated on her grandmother.  States that her mother is also positive and that is where her grandmother will be staying.  States that the house is inhabitable and has water and electricity.  There was a question about this per the night RN but is confirmed to be appropriate for situation.

## 2019-05-14 NOTE — Progress Notes (Signed)
[name] for warfarin Indication: atrial fibrillation  Allergies  Allergen Reactions  . Macrobid [Nitrofurantoin Monohyd Macro] Rash  . Torsemide Nausea And Vomiting  . Tramadol Nausea Only    Patient Measurements: Height: 5\' 3"  (160 cm) Weight: 100 lb 9.6 oz (45.6 kg) IBW/kg (Calculated) : 52.4 Heparin Dosing Weight: 46.7 kg  Vital Signs: Temp: 98.9 F (37.2 C) (10/04 0519) Temp Source: Oral (10/04 0519) BP: 141/72 (10/04 0519) Pulse Rate: 74 (10/04 0519)  Labs: Recent Labs    05/12/19 0223 05/13/19 0300 05/14/19 0408  HGB 11.4* 9.9* 10.2*  HCT 36.5 32.5* 31.8*  PLT 180 164 164  LABPROT 30.4* 29.1* 28.0*  INR 3.0* 2.8* 2.7*  CREATININE 1.52* 1.17* 1.06*    Estimated Creatinine Clearance: 29.5 mL/min (A) (by C-G formula based on SCr of 1.06 mg/dL (H)).   Assessment: [name] is a 84 y.o. female with medical history significant for CAD, afib on warfarin, chronic diastolic CHF, COPD with chronic hypoxic respiratory failure, CKD stage III, type 2 DM. Pharmacy consulted to dose warfarin while inpatient.  Baseline INR is therapeutic at 2.8 (Goal 2.5-3.5). Recently admitted with elevated INR up to 7.3.   PTA dose: warfarin 1 mg PO daily  Today, INR 2.7 (therapeutic). H/H down, Plt wnl. Oral intake seems to have improved   Goal of Therapy:  INR 2.5-3.5 Monitor platelets by anticoagulation protocol: Yes   Plan:  Resume warfarin 1 mg daily  Monitor daily INR, CBC, clinical course, s/sx of bleed, PO intake, DDI   Thank you for allowing [place] to participate in this patients care.  Albertina Parr, PharmD., BCPS Clinical Pharmacist Clinical phone for 05/14/19 until 5pm: [phone]
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
## 2019-05-15 DIAGNOSIS — J1289 Other viral pneumonia: Secondary | ICD-10-CM

## 2019-05-15 DIAGNOSIS — I10 Essential (primary) hypertension: Secondary | ICD-10-CM

## 2019-05-15 DIAGNOSIS — U071 COVID-19: Principal | ICD-10-CM

## 2019-05-15 LAB — GLUCOSE, CAPILLARY
Glucose-Capillary: 171 mg/dL — ABNORMAL HIGH (ref 70–99)
Glucose-Capillary: 166 mg/dL — ABNORMAL HIGH (ref 70–99)
Glucose-Capillary: 122 mg/dL — ABNORMAL HIGH (ref 70–99)
Glucose-Capillary: 140 mg/dL — ABNORMAL HIGH (ref 70–99)
Glucose-Capillary: 232 mg/dL — ABNORMAL HIGH (ref 70–99)
Glucose-Capillary: 155 mg/dL — ABNORMAL HIGH (ref 70–99)
Glucose-Capillary: 186 mg/dL — ABNORMAL HIGH (ref 70–99)
Glucose-Capillary: 148 mg/dL — ABNORMAL HIGH (ref 70–99)

## 2019-05-15 LAB — PROTIME-INR
INR: 3.1 — ABNORMAL HIGH (ref 0.8–1.2)
Prothrombin Time: 31.6 seconds — ABNORMAL HIGH (ref 11.4–15.2)

## 2019-05-15 MED ORDER — FUROSEMIDE 20 MG PO TABS: 20.0000 mg | ORAL_TABLET | Freq: Every day | ORAL | 0 refills | Status: DC

## 2019-05-15 MED ORDER — METHYLPREDNISOLONE 4 MG PO TBPK: ORAL_TABLET | ORAL | 0 refills | Status: DC

## 2019-05-15 NOTE — Discharge Summary (Addendum)
[name] FYB:[id number] DOB: Dec 26, 1934 DOA: 05/09/2019  PCP: [name] Pretty, FNP  Admit date: 05/09/2019  Discharge date: 05/16/2019  Admitted From: Home   Disposition:  Home   Recommendations for Outpatient Follow-up:   Follow up with PCP in 1-2 weeks  PCP Please obtain BMP/CBC, 2 view CXR in 1week,  (see Discharge instructions)   PCP Please follow up on the following pending results:    Home Health: PT,RN   Equipment/Devices: None  Consultations: None  Discharge Condition: Stable    CODE STATUS: Full    Diet Recommendation: Heart Healthy Low Carb    Chief Complaint  Patient presents with   Abdominal Pain     Brief history of present illness from the day of admission and additional interim summary    [name] Robertsonis a 84 y.o.femalewith medical history significant forcoronary artery disease, chronic atrial fibrillation on warfarin, chronic diastolic CHF, COPD with chronic hypoxic respiratory failure, chronic kidney disease stage III, type 2 diabetes mellitus, anxiety, now presenting to the emergency department for evaluation of burning discomfort in the upper abdomen.  In the ER she was diagnosed with COVID-19 pneumonitis, acute on chronic diastolic CHF and admitted to the hospital.                                                                 Hospital Course   1. Acute Hypoxic Resp. Failure due to Acute Covid 19 Viral Pneumonitis during the ongoing 2020 Covid 19 Pandemic along with CHF as below- her disease is quite mild, inflammatory markers however are elevated, she was placed on Remdisvir along with Decadron and clinically responded very well and is back to her baseline now. Stable inflammatory markers has finished her Remdisvir and steroid course and will be discharged  home.[name]  COVID-19 Labs  No results for input(s): DDIMER, FERRITIN, LDH, CRP in the last 72 hours.  Lab Results  Component Value Date   [place] (A) 05/09/2019   [place] NEGATIVE 04/25/2019   [place] Not Detected 12/29/2018      2.  Acute on chronic diastolic CHF last EF 52% on echocardiogram .  Echo from this admission noted, she was diuresed and is now compensated will be placed on low-dose Lasix upon discharge.  CHF was the larger component of her symptoms in my opinion.  She has been requested to follow with PCP and her primary cardiologist post discharge.  Request PCP to monitor her weight and diuretic dose along with BMP closely post discharge.  3.  Chronic atrial fibrillation [place] vas 2 score of at least 3.  On diltiazem and Coumadin pharmacy monitoring INR.  Goal rate control.  Lab Results  Component Value Date   INR 3.7 (H) 05/16/2019   INR 3.1 (H) 05/15/2019   INR 2.7 (H) 05/14/2019  4.  COPD with underlying chronic hypoxic respiratory failure.  Continue supportive care with nebulizer treatments, uses 2 L nasal cannula oxygen at home at all times continue.  She is at baseline.  5.  CAD with chronic left bundle branch block.  Chest pain-free no acute issues mildly elevated troponin likely due to hypoxic respiratory failure from reasons #1 and 2 above.  Troponin trend is flat and and non-ACS pattern, continue combination of statin, Cardizem and Coumadin.  Outpatient cardiology follow-up.  No acute issues.  6.  Chronic anxiety and depression.  Home dose Klonopin and other medications will be continued.  7.  Smoking.  Counseled to quit.  8.  GERD and initial GI complaints.  After receiving Carafate in the ER the symptoms have resolved, continue PPI and Carafate combination here.  Outpatient GI follow-up if needed.  9.  Left lower extremity wound present on admission .  Wound present on admission almost completely healed, wound care on consult, no  signs of active infection or cellulitis.  10.  Chronic  lower quadrant abdominal discomfort.  Likely narcotic bowel discussed with daughter on 05/12/2019, patient has had extensive outpatient GI work-up which was unremarkable, CT scan abdomen pelvis unremarkable here as well, placed on bowel regimen with good effect, PCP to monitor and provide supportive care as needed in the outpatient setting, minimize chronic narcotic use.  11.  AKI.  Likely due to overdiuresis resolved after gentle hydration.    12. DM type II.  Recent A1c was 5.3 suggesting stable control.  Continue home regimen of Januvia at home.  Discharge diagnosis     Principal Problem:   Pneumonia Active Problems:   Type 2 diabetes mellitus with kidney complication, without long-term current use of insulin (HCC)   Macrocytic anemia   Essential hypertension   COPD GOLD O    Unspecified atrial fibrillation (HCC)   CAD (coronary artery disease)   Acute on chronic diastolic CHF (congestive heart failure) (HCC)   GERD (gastroesophageal reflux disease)   CKD (chronic kidney disease), stage III (HCC)   Leg wound, left, sequela   Chronic respiratory failure with hypoxia ([place])   Pneumonia due to COVID-19 virus    Discharge instructions    Discharge Instructions    MyChart COVID-19 home monitoring program   Complete by: May 15, 2019    Is the patient willing to use the [place] for home monitoring?: Yes   Temperature monitoring   Complete by: May 15, 2019    After how many days would you like to receive a notification of this patient's flowsheet entries?: 1   Discharge instructions   Complete by: As directed    Follow with Primary MD [name] Done Mary-Margaret, FNP and your primary cardiologist in 3 days   Get CBC, INR, CMP, 2 view Chest X ray -  checked next visit within 3 days by Primary MD   Activity: As tolerated with Full fall precautions use walker/cane & assistance as needed  Disposition Home    Diet:  Heart Healthy Low Carb, with 1.5 L/day total fluid restriction  Special Instructions: If you have smoked or chewed Tobacco  in the last 2 yrs please stop smoking, stop any regular Alcohol  and or any Recreational drug use.  On your next visit with your primary care physician please Get Medicines reviewed and adjusted.  Please request your Prim.MD to go over all Hospital Tests and Procedure/Radiological results at the follow up, please get all Hospital records sent to  your Prim MD by signing hospital release before you go home.  If you experience worsening of your admission symptoms, develop shortness of breath, life threatening emergency, suicidal or homicidal thoughts you must seek medical attention immediately by calling 911 or calling your MD immediately  if symptoms less severe.  You Must read complete instructions/literature along with all the possible adverse reactions/side effects for all the Medicines you take and that have been prescribed to you. Take any new Medicines after you have completely understood and accpet all the possible adverse reactions/side effects.   Do not drive, operate heavy machinery, perform activities at heights, swimming or participation in water activities or provide baby sitting services if your were admitted for syncope or siezures until you have seen by Primary MD or a Neurologist and advised to do so again.  Do not drive when taking Pain medications.  Do not take more than prescribed Pain, Sleep and Anxiety Medications   Increase activity slowly   Complete by: As directed       Discharge Medications   Allergies as of 05/16/2019      Reactions   Macrobid [nitrofurantoin Monohyd Macro] Rash   Torsemide Nausea And Vomiting   Tramadol Nausea Only      Medication List    STOP taking these medications   ciprofloxacin 500 MG tablet Commonly known as: Cipro   potassium chloride SA 20 MEQ tablet Commonly known as: KLOR-CON     TAKE these medications    budesonide-formoterol 160-4.5 MCG/ACT inhaler Commonly known as: Symbicort Inhale 2 puffs into the lungs 2 (two) times daily.   clonazePAM 0.5 MG tablet Commonly known as: KLONOPIN TAKE 1 TABLET 3 TIMES DAILY AS NEEDED FOR ANXIETY What changed:   how much to take  how to take this  when to take this  reasons to take this  additional instructions   diltiazem 120 MG tablet Commonly known as: CARDIZEM Take 1 tablet (120 mg total) by mouth daily.   ferrous sulfate 325 (65 FE) MG tablet Take 1 tablet (325 mg total) by mouth daily with breakfast.   furosemide 20 MG tablet Commonly known as: Lasix Take 1 tablet (20 mg total) by mouth daily.   HYDROcodone-acetaminophen 7.5-325 MG tablet Commonly known as: NORCO Take 1 tablet by mouth 3 (three) times daily as needed (pain).   ipratropium-albuterol 0.5-2.5 (3) MG/3ML Soln Commonly known as: DUONEB INHALE THE CONTENTS OF 1 VIAL VIA NEBULIZER EVERY 4 HOURS AS NEEDED What changed:   how much to take  how to take this  when to take this  reasons to take this  additional instructions   lipase/protease/amylase 12000 units Cpep capsule Commonly known as: CREON Take 36,000 Units by mouth 3 (three) times daily with meals. Take 3 capsules with meals and 1 capsule with snacks   methylPREDNISolone 4 MG Tbpk tablet Commonly known as: MEDROL DOSEPAK follow package directions   nitroGLYCERIN 0.4 MG SL tablet Commonly known as: NITROSTAT DISSOLVE 1 TABLET UNDER THE TONGUE EVERY 5 MINUTES AS NEEDED FOR CHEST PAIN AS DIRECTED What changed: See the new instructions.   OXYGEN Inhale 2 L into the lungs at bedtime. 2lpm with sleep only   pantoprazole 40 MG tablet Commonly known as: PROTONIX Take 1 tablet (40 mg total) by mouth 2 (two) times daily.   PreserVision AREDS 2 Caps Take 1 capsule by mouth daily.   simvastatin 10 MG tablet Commonly known as: ZOCOR Take 1 tablet (10 mg total) by mouth daily.  sitaGLIPtin 50 MG  tablet Commonly known as: Januvia TAKE 1 TABLET (50 MG TOTAL) BY MOUTH DAILY. What changed:   how much to take  how to take this  when to take this  additional instructions   Ventolin HFA 108 (90 Base) MCG/ACT inhaler Generic drug: albuterol USE 2 PUFFS EVERY 6 HOURS AS NEEDED What changed: See the new instructions.   warfarin 2 MG tablet Commonly known as: COUMADIN Take as directed. If you are unsure how to take this medication, talk to your nurse or doctor. Original instructions: Take 0.5 tablets (1 mg total) by mouth every evening. TAKE 1/2  TABLET (1 MG TOTAL) DAILY AT 6 PM.Get INR check by PCP the day after you are discharged Start taking on: May 18, 2019 What changed:   how much to take  how to take this  when to take this  additional instructions  These instructions start on May 18, 2019. If you are unsure what to do until then, ask your doctor or other care provider.       Follow-up [facility] Vera, [facility] Follow up.   Why: A representative from [facility] will contact you to arrange date/time to resume your home health services. Contact information: [place] [address] [phone]        [name] Pretty, FNP. Schedule an appointment as soon as possible for a visit in 1 week(s).   Specialty: Family Medicine Contact information: [place] [address] [phone]        Herminio Commons, MD. Schedule an appointment as soon as possible for a visit in 1 week(s).   Specialty: Cardiology Contact information: [place] [address] [phone]        Thompson Grayer, MD .   Specialty: Cardiology Contact information: [place] [address] [phone]           Major procedures and Radiology Reports - PLEASE review detailed and final reports thoroughly  -         Ct Abdomen Pelvis Wo Contrast  Result  Date: 05/13/2019 CLINICAL DATA:  84 year old female with history of abdominal pain. Gastroenteritis or colitis is suspected. EXAM: CT ABDOMEN AND PELVIS WITHOUT CONTRAST TECHNIQUE: Multidetector CT imaging of the abdomen and pelvis was performed following the standard protocol without IV contrast. COMPARISON:  CT the abdomen and pelvis 12/27/2017. FINDINGS: Lower chest: Cardiomegaly. Pacemaker lead terminating in the right ventricular apex. Aortic atherosclerosis with atherosclerotic calcifications in the right coronary artery. Hepatobiliary: No definite cystic or solid hepatic lesions are confidently identified on today's noncontrast CT examination. Status post cholecystectomy. Common bile duct measures 15 mm in the porta hepatis, similar to the prior study, likely reflective of benign post cholecystectomy physiology. Pancreas: No definite pancreatic mass or peripancreatic fluid collections or inflammatory changes on today's noncontrast CT examination. Spleen: Unremarkable. Adrenals/Urinary Tract: Calcifications in the left renal hilum, strongly favored to be vascular rather than nonobstructive calculi. Unenhanced appearance of the kidneys and bilateral adrenal glands is otherwise unremarkable. No hydroureteronephrosis. Urinary bladder is normal in appearance. Stomach/Bowel: Normal appearance of the stomach. No pathologic dilatation of small bowel or colon. Numerous colonic diverticulae are noted, particularly in the sigmoid colon, without surrounding inflammatory changes to suggest an acute diverticulitis at this time. The appendix is not confidently identified and may be surgically absent. Regardless, there are no inflammatory changes noted adjacent to the cecum to suggest  the presence of an acute appendicitis at this time. Vascular/Lymphatic: Aortic atherosclerosis. No lymphadenopathy noted in the abdomen or pelvis. Reproductive: Uterus and ovaries are atrophic. Other: Ventral hernia status post mesh repair  containing several loops of small bowel and colon. No significant volume of ascites. No pneumoperitoneum. Musculoskeletal: There are no aggressive appearing lytic or blastic lesions noted in the visualized portions of the skeleton. IMPRESSION: 1. No acute findings are noted in the abdomen or pelvis to account for the patient's symptoms. 2. Colonic diverticulosis without evidence of acute diverticulitis at this time. 3. Ventral hernia status post mesh repair, containing several loops of small bowel and colon, without evidence of bowel incarceration or obstruction at this time. 4. Aortic atherosclerosis. Electronically Signed   By: Vinnie Langton M.D.   On: 05/13/2019 17:10   Dg Abdomen 1 View  Result Date: 05/09/2019 CLINICAL DATA:  84 year old female with acute abdominal pain and burning. EXAM: ABDOMEN - 1 VIEW COMPARISON:  08/25/2017 radiographs FINDINGS: Nondistended gas-filled loops of colon and small bowel are noted. No dilated bowel loops are present. Abdominal wall mesh markers are present. No suspicious calcifications are identified. No acute bony abnormalities are noted. Degenerative changes in the LOWER lumbar spine are again identified. IMPRESSION: Nonspecific nonobstructive bowel gas pattern. Electronically Signed   By: Margarette Canada M.D.   On: 05/09/2019 21:37   Ct Head Wo Contrast  Result Date: 04/26/2019 CLINICAL DATA:  Weakness, cough and shortness of breath for 1 week EXAM: CT HEAD WITHOUT CONTRAST TECHNIQUE: Contiguous axial images were obtained from the base of the skull through the vertex without intravenous contrast. COMPARISON:  CT 10/19/2017 FINDINGS: Imaging quality is motion degraded. Brain: No evidence of acute infarction, hemorrhage, hydrocephalus, extra-axial collection or mass lesion/mass effect. Symmetric prominence of the ventricles, cisterns and sulci compatible with parenchymal volume loss. Patchy areas of white matter hypoattenuation are most compatible with chronic  microvascular angiopathy. Vascular: Atherosclerotic calcification of the carotid siphons and intradural vertebral arteries. No hyperdense vessel. Skull: No calvarial fracture or suspicious osseous lesion. No scalp swelling or hematoma. Sinuses/Orbits: Retention cyst present in the right maxillary sinus. Remaining paranasal sinuses and mastoid air cells are predominantly clear. Included orbital structures are unremarkable. Other: None IMPRESSION: Motion degraded study. No definite acute intracranial abnormality. Chronic microvascular changes and parenchymal volume loss are similar to prior. Electronically Signed   By: [name] M.D.   On: 04/26/2019 01:22   Dg Chest Port 1 View  Result Date: 05/10/2019 CLINICAL DATA:  Shortness of breath period EXAM: PORTABLE CHEST 1 VIEW COMPARISON:  May 09, 2019 FINDINGS: Lungs hyperexpanded. Patchy airspace consolidation throughout much of the left lung is again noted with overall slight clearing in the left upper lobe and left base regions. Right lung is clear. Pacemaker lead tip is attached to the right ventricle. Cardiomegaly is stable. The pulmonary vascularity is normal. There is aortic atherosclerosis. No adenopathy. Bones are osteoporotic. Sclerotic lesion in the proximal right humerus is stable. There is superior migration of each humeral head. IMPRESSION: Areas of airspace opacity consistent with multifocal pneumonia on the left. Slight partial clearing in left upper lobe and left base regions. No new opacity evident. Right lung clear. Underlying hyperexpansion of the lungs. Stable cardiomegaly. Stable pacemaker position. Aortic Atherosclerosis (ICD10-I70.0). Sclerotic area in the proximal right humerus consistent with either bone infarct or enchondroma, stable. Superior migration of each humeral head, a finding indicative of chronic rotator cuff tears. Electronically Signed   By: Lowella Grip III M.D.  On: 05/10/2019 08:22   Dg Chest Portable 1  View  Result Date: 05/09/2019 CLINICAL DATA:  Cough and wheezing. EXAM: PORTABLE CHEST 1 VIEW COMPARISON:  Chest radiograph 04/25/2019, CT 09/12/2018 FINDINGS: Left-sided pacemaker in place. Mild cardiomegaly with unchanged mediastinal contours. Increased bronchial thickening and interstitial opacities since prior exam. Suspected small pleural effusions, left greater than right. New patchy opacity in the left suprahilar lung. No pneumothorax. Chronic benign lesion in the right proximal humerus. IMPRESSION: 1. New patchy opacity in the left suprahilar lung, suspicious for pneumonia. Followup PA and lateral chest X-ray is recommended in 3-4 weeks following trial of antibiotic therapy to ensure resolution and exclude underlying malignancy. 2. Increased interstitial opacities and bronchial thickening since prior, infectious versus pulmonary edema. 3. Suspected small pleural effusions. Electronically Signed   By: Keith Rake M.D.   On: 05/09/2019 22:40   Dg Chest Portable 1 View  Result Date: 04/25/2019 CLINICAL DATA:  Shortness of breath EXAM: PORTABLE CHEST 1 VIEW COMPARISON:  08/11/2018, 08/07/2017 FINDINGS: Left-sided pacing device as before. Stable cardiomediastinal silhouette with aortic atherosclerosis. No focal consolidation or effusion. Chronic appearing bronchitic changes. No pneumothorax. Stable sclerotic focus in the right humeral head. IMPRESSION: No active disease. Chronic interstitial opacities. Mild cardiomegaly Electronically Signed   By: Donavan Foil M.D.   On: 04/25/2019 23:10   Dg Abd Portable 1v  Result Date: 05/12/2019 CLINICAL DATA:  Acute right lower quadrant abdominal pain. EXAM: PORTABLE ABDOMEN - 1 VIEW COMPARISON:  Radiographs of May 09, 2019. FINDINGS: The bowel gas pattern is normal. No radio-opaque calculi or other significant radiographic abnormality are seen. Status post ventral hernia repair. IMPRESSION: No evidence of bowel obstruction or ileus. Electronically  Signed   By: Marijo Conception M.D.   On: 05/12/2019 09:38    Micro Results     Recent Results (from the past 240 hour(s))  SARS Coronavirus 2 [facility] order, Performed in [facility] hospital lab) Nasopharyngeal Nasopharyngeal Swab     Status: Abnormal   Collection Time: 05/09/19 10:22 PM   Specimen: Nasopharyngeal Swab  Result Value Ref Range Status   SARS Coronavirus 2 POSITIVE (A) NEGATIVE Final    Comment: RESULT CALLED TO, READ BACK BY AND VERIFIED WITH: M DOSS,RN @0020  05/10/19 MKELLY (NOTE) If result is NEGATIVE SARS-CoV-2 target nucleic acids are NOT DETECTED. The SARS-CoV-2 RNA is generally detectable in upper and lower  respiratory specimens during the acute phase of infection. The lowest  concentration of SARS-CoV-2 viral copies this assay can detect is 250  copies / mL. A negative result does not preclude SARS-CoV-2 infection  and should not be used as the sole basis for treatment or other  patient management decisions.  A negative result may occur with  improper specimen collection / handling, submission of specimen other  than nasopharyngeal swab, presence of viral mutation(s) within the  areas targeted by this assay, and inadequate number of viral copies  (<250 copies / mL). A negative result must be combined with clinical  observations, patient history, and epidemiological information. If result is POSITIVE SARS-CoV-2 target nucleic acids are DETECTED. The S ARS-CoV-2 RNA is generally detectable in upper and lower  respiratory specimens during the acute phase of infection.  Positive  results are indicative of active infection with SARS-CoV-2.  Clinical  correlation with patient history and other diagnostic information is  necessary to determine patient infection status.  Positive results do  not rule out bacterial infection or co-infection with other viruses. If result is PRESUMPTIVE  POSTIVE SARS-CoV-2 nucleic acids MAY BE PRESENT.   A presumptive positive  result was obtained on the submitted specimen  and confirmed on repeat testing.  While 2019 novel coronavirus  (SARS-CoV-2) nucleic acids may be present in the submitted sample  additional confirmatory testing may be necessary for epidemiological  and / or clinical management purposes  to differentiate between  SARS-CoV-2 and other Sarbecovirus currently known to infect humans.  If clinically indicated additional testing with an alternate test  methodology [phone]) is adv ised. The SARS-CoV-2 RNA is generally  detectable in upper and lower respiratory specimens during the acute  phase of infection. The expected result is Negative. Fact Sheet for Patients:  StrictlyIdeas.no Fact Sheet for Healthcare Providers: BankingDealers.co.za This test is not yet approved or cleared by the [place] FDA and has been authorized for detection and/or diagnosis of SARS-CoV-2 by FDA under an Emergency Use Authorization (EUA).  This EUA will remain in effect (meaning this test can be used) for the duration of the COVID-19 declaration under Section 564(b)(1) of the Act, 21 U.S.C. section 360bbb-3(b)(1), unless the authorization is terminated or revoked sooner. Performed at [facility], [address], [place], [place] [address]     Today   Subjective    [name] today has no headache,no chest abdominal pain,no new weakness tingling or numbness, feels much better wants to go home today.     Objective   Blood pressure 125/76, pulse 87, temperature 98 F (36.7 C), temperature source Oral, resp. rate 18, height 5\' 3"  (1.6 m), weight 47.9 kg, SpO2 97 %.   Intake/Output Summary (Last 24 hours) at 05/16/2019 1314 Last data filed at 05/16/2019 1313 Gross per 24 hour  Intake 836 ml  Output 400 ml  Net 436 ml    Exam Awake Alert, Oriented x 3, No new F.N deficits, Normal affect [place].AT,PERRAL Supple Neck,No JVD, No cervical lymphadenopathy  appriciated.  Symmetrical Chest wall movement, Good air movement bilaterally, CTAB RRR,No Gallops,Rubs or new Murmurs, No Parasternal Heave +ve B.Sounds, Abd Soft, Non tender, No organomegaly appriciated, No rebound -guarding or rigidity. No Cyanosis, Clubbing or edema, No new Rash or bruise   Data Review   CBC w Diff:  Lab Results  Component Value Date   WBC 7.1 05/14/2019   HGB 10.2 (L) 05/14/2019   HGB 10.2 (L) 04/26/2018   HCT 31.8 (L) 05/14/2019   HCT 31.7 (L) 04/26/2018   HCT 36 04/02/2012   PLT 164 05/14/2019   PLT 209 04/26/2018   LYMPHOPCT 8 05/14/2019   MONOPCT 3 05/14/2019   EOSPCT 0 05/14/2019   BASOPCT 0 05/14/2019    CMP:  Lab Results  Component Value Date   NA 135 05/14/2019   NA 141 03/16/2019   K 5.0 05/14/2019   K 4.5 04/02/2012   CL 98 05/14/2019   CL 103 04/02/2012   CO2 30 05/14/2019   CO2 28 04/02/2012   BUN 47 (H) 05/14/2019   BUN 20 03/16/2019   CREATININE 1.06 (H) 05/14/2019   CREATININE 1.38 (H) 01/18/2013   GLU 65 08/09/2012   PROT 5.8 (L) 05/13/2019   PROT 6.5 03/16/2019   PROT 7.7 04/02/2012   ALBUMIN 2.6 (L) 05/13/2019   ALBUMIN 3.6 03/16/2019   BILITOT 0.5 05/13/2019   BILITOT 0.4 03/16/2019   BILITOT 0.6 04/02/2012   ALKPHOS 76 05/13/2019   ALKPHOS 90 04/02/2012   AST 100 (H) 05/13/2019   AST 56 04/02/2012   ALT 60 (H) 05/13/2019  .  Total Time in preparing paper work, data evaluation and todays exam - 53 minutes  Lala Lund M.D on 05/16/2019 at 1:14 PM  [facility]   Office  [phone]
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
## 2019-05-15 NOTE — Progress Notes (Addendum)
[place] for warfarin Indication: atrial fibrillation  Allergies  Allergen Reactions  . Macrobid [Nitrofurantoin Monohyd Macro] Rash  . Torsemide Nausea And Vomiting  . Tramadol Nausea Only    Patient Measurements: Height: 5\' 3"  (160 cm) Weight: 100 lb 9.6 oz (45.6 kg) IBW/kg (Calculated) : 52.4 Heparin Dosing Weight: 46.7 kg  Vital Signs: Temp: 98.3 F (36.8 C) (10/05 0804) Temp Source: Oral (10/05 0804) BP: 148/77 (10/05 0804) Pulse Rate: 75 (10/05 0804)  Labs: Recent Labs    05/13/19 0300 05/14/19 0408 05/15/19 0515  HGB 9.9* 10.2*  --   HCT 32.5* 31.8*  --   PLT 164 164  --   LABPROT 29.1* 28.0* 31.6*  INR 2.8* 2.7* 3.1*  CREATININE 1.17* 1.06*  --     Estimated Creatinine Clearance: 29.5 mL/min (A) (by C-G formula based on SCr of 1.06 mg/dL (H)).   Assessment: [name] is a 84 y.o. female with medical history significant for CAD, afib on warfarin, chronic diastolic CHF, COPD with chronic hypoxic respiratory failure, CKD stage III, type 2 DM. Pharmacy consulted to dose warfarin while inpatient.  Baseline INR is therapeutic at 2.8 (Goal 2.5-3.5). Recently admitted with elevated INR up to 7.3.  PTA dose: warfarin 1 mg PO daily  Today, INR 3.1 (therapeutic). No new CBC, no oral intake charted.  Discharge planning for today.  Goal of Therapy:  INR 2.5-3.5 Monitor platelets by anticoagulation protocol: Yes   Plan:  Resume warfarin 1 mg daily  Monitor daily INR, CBC, clinical course, s/sx of bleed, PO intake, DDI   Thank you for allowing [place] to participate in this patients care.  Gretta Arab PharmD, BCPS Clinical pharmacist phone 7am- 5pm: [phone] 05/15/2019 11:39 AM

## 2019-05-15 NOTE — Care Management Important Message (Signed)
Important Message  Patient Details  Name: Cordell Coke MRN: [id number] Date of Birth: 1935-02-25   Medicare Important Message Given:  Yes - Important Message mailed due to current National Emergency  Verbal consent obtained due to current National Emergency  Relationship to patient: Child   Call Date: 05/15/19  Time: 1426 Phone: Johnell Comings [phone] Outcome: No Answer/Busy Important Message mailed to: Patient address on file    Delorse Lek 05/15/2019, 2:27 PM

## 2019-05-15 NOTE — Progress Notes (Signed)
Spoke with SW and MD about pt social situation and orders given to cancel discharge at this time.
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
## 2019-05-16 LAB — GLUCOSE, CAPILLARY
Glucose-Capillary: 139 mg/dL — ABNORMAL HIGH (ref 70–99)
Glucose-Capillary: 118 mg/dL — ABNORMAL HIGH (ref 70–99)

## 2019-05-16 LAB — PROTIME-INR
INR: 3.7 — ABNORMAL HIGH (ref 0.8–1.2)
Prothrombin Time: 36.1 seconds — ABNORMAL HIGH (ref 11.4–15.2)

## 2019-05-16 MED ORDER — WARFARIN SODIUM 2 MG PO TABS: 1.0000 mg | ORAL_TABLET | Freq: Every evening | ORAL | Status: AC
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
## 2019-05-16 NOTE — Progress Notes (Signed)
[name] for warfarin Indication: atrial fibrillation  Allergies  Allergen Reactions  . Macrobid [Nitrofurantoin Monohyd Macro] Rash  . Torsemide Nausea And Vomiting  . Tramadol Nausea Only    Patient Measurements: Height: 5\' 3"  (160 cm) Weight: 105 lb 8 oz (47.9 kg) IBW/kg (Calculated) : 52.4 Heparin Dosing Weight: 46.7 kg  Vital Signs: Temp: 98 F (36.7 C) (10/06 0850) Temp Source: Oral (10/06 0850) BP: 125/76 (10/06 0850) Pulse Rate: 87 (10/06 0950)  Labs: Recent Labs    05/14/19 0408 05/15/19 0515 05/16/19 0228  HGB 10.2*  --   --   HCT 31.8*  --   --   PLT 164  --   --   LABPROT 28.0* 31.6* 36.1*  INR 2.7* 3.1* 3.7*  CREATININE 1.06*  --   --     Estimated Creatinine Clearance: 30.9 mL/min (A) (by C-G formula based on SCr of 1.06 mg/dL (H)).   Assessment: [name] is a 84 y.o. female with medical history significant for CAD, afib on warfarin, chronic diastolic CHF, COPD with chronic hypoxic respiratory failure, CKD stage III, type 2 DM. Pharmacy consulted to dose warfarin while inpatient.  Baseline INR is therapeutic at 2.8 (Goal 2.5-3.5). Recently admitted with elevated INR up to 7.3.  PTA dose: warfarin 1 mg PO daily  Today, INR increased to supratherapeutic at 3.7.  No new CBC, no oral intake charted.  Discharge planning for today.  Goal of Therapy:  INR 2.5-3.5 Monitor platelets by anticoagulation protocol: Yes   Plan:  HOLD warfarin dose tonight. Monitor daily INR, clinical course, s/sx of bleed, PO intake, DDI Re-check CBC with AM labs if remains inpatient.  If discharged, recommend resuming lower dose warfarin 0.5 mg PO daily on Thursday, or after INR < 3.5.  Recheck INR no later than Thursday or Friday.   Thank you for allowing [place] to participate in this patients care.  Gretta Arab PharmD, BCPS Clinical pharmacist phone 7am- 5pm: [phone] 05/16/2019 12:51 PM

## 2019-05-16 NOTE — Discharge Instructions (Signed)
Follow with Primary MD Hassell Done, Mary-Margaret, FNP and your primary cardiologist in 3 days   Get CBC, INR, CMP, 2 view Chest X ray -  checked next visit within 3 days by Primary MD   Activity: As tolerated with Full fall precautions use walker/cane & assistance as needed  Disposition Home    Diet: Heart Healthy Low Carb, with 1.5 L/day total fluid restriction  Special Instructions: If you have smoked or chewed Tobacco  in the last 2 yrs please stop smoking, stop any regular Alcohol  and or any Recreational drug use.  On your next visit with your primary care physician please Get Medicines reviewed and adjusted.  Please request your Prim.MD to go over all Hospital Tests and Procedure/Radiological results at the follow up, please get all Hospital records sent to your Prim MD by signing hospital release before you go home.  If you experience worsening of your admission symptoms, develop shortness of breath, life threatening emergency, suicidal or homicidal thoughts you must seek medical attention immediately by calling 911 or calling your MD immediately  if symptoms less severe.  You Must read complete instructions/literature along with all the possible adverse reactions/side effects for all the Medicines you take and that have been prescribed to you. Take any new Medicines after you have completely understood and accpet all the possible adverse reactions/side effects.   Do not drive, operate heavy machinery, perform activities at heights, swimming or participation in water activities or provide baby sitting services if your were admitted for syncope or siezures until you have seen by Primary MD or a Neurologist and advised to do so again.  Do not drive when taking Pain medications.  Do not take more than prescribed Pain, Sleep and Anxiety Medications      Person Under Monitoring Name: [name]  Location: [address] [place] [address]   Infection Prevention Recommendations  for Individuals Confirmed to have, or Being Evaluated for, 2019 Novel Coronavirus (COVID-19) Infection Who Receive Care at Home  Individuals who are confirmed to have, or are being evaluated for, COVID-19 should follow the prevention steps below until a healthcare provider or local or state health department says they can return to normal activities.  Stay home except to get medical care You should restrict activities outside your home, except for getting medical care. Do not go to work, school, or public areas, and do not use public transportation or taxis.  Call ahead before visiting your doctor Before your medical appointment, call the healthcare provider and tell them that you have, or are being evaluated for, COVID-19 infection. This will help the healthcare providers office take steps to keep other people from getting infected. Ask your healthcare provider to call the local or state health department.  Monitor your symptoms Seek prompt medical attention if your illness is worsening (e.g., difficulty breathing). Before going to your medical appointment, call the healthcare provider and tell them that you have, or are being evaluated for, COVID-19 infection. Ask your healthcare provider to call the local or state health department.  Wear a facemask You should wear a facemask that covers your nose and mouth when you are in the same room with other people and when you visit a healthcare provider. People who live with or visit you should also wear a facemask while they are in the same room with you.  Separate yourself from other people in your home As much as possible, you should stay in a different room from other people  in your home. Also, you should use a separate bathroom, if available.  Avoid sharing household items You should not share dishes, drinking glasses, cups, eating utensils, towels, bedding, or other items with other people in your home. After using these items, you  should wash them thoroughly with soap and water.  Cover your coughs and sneezes Cover your mouth and nose with a tissue when you cough or sneeze, or you can cough or sneeze into your sleeve. Throw used tissues in a lined trash can, and immediately wash your hands with soap and water for at least 20 seconds or use an alcohol-based hand rub.  Wash your [facility] your hands often and thoroughly with soap and water for at least 20 seconds. You can use an alcohol-based hand sanitizer if soap and water are not available and if your hands are not visibly dirty. Avoid touching your eyes, nose, and mouth with unwashed hands.   Prevention Steps for Caregivers and Household Members of Individuals Confirmed to have, or Being Evaluated for, COVID-19 Infection Being Cared for in the Home  If you live with, or provide care at home for, a person confirmed to have, or being evaluated for, COVID-19 infection please follow these guidelines to prevent infection:  Follow healthcare providers instructions Make sure that you understand and can help the patient follow any healthcare provider instructions for all care.  Provide for the patients basic needs You should help the patient with basic needs in the home and provide support for getting groceries, prescriptions, and other personal needs.  Monitor the patients symptoms If they are getting sicker, call his or her medical provider and tell them that the patient has, or is being evaluated for, COVID-19 infection. This will help the healthcare providers office take steps to keep other people from getting infected. Ask the healthcare provider to call the local or state health department.  Limit the number of people who have contact with the patient  If possible, have only one caregiver for the patient.  Other household members should stay in another home or place of residence. If this is not possible, they should stay  in another room, or be  separated from the patient as much as possible. Use a separate bathroom, if available.  Restrict visitors who do not have an essential need to be in the home.  Keep older adults, very young children, and other sick people away from the patient Keep older adults, very young children, and those who have compromised immune systems or chronic health conditions away from the patient. This includes people with chronic heart, lung, or kidney conditions, diabetes, and cancer.  Ensure good ventilation Make sure that shared spaces in the home have good air flow, such as from an air conditioner or an opened window, weather permitting.  Wash your hands often  Wash your hands often and thoroughly with soap and water for at least 20 seconds. You can use an alcohol based hand sanitizer if soap and water are not available and if your hands are not visibly dirty.  Avoid touching your eyes, nose, and mouth with unwashed hands.  Use disposable paper towels to dry your hands. If not available, use dedicated cloth towels and replace them when they become wet.  Wear a facemask and gloves  Wear a disposable facemask at all times in the room and gloves when you touch or have contact with the patients blood, body fluids, and/or secretions or excretions, such as sweat, saliva, sputum, nasal mucus,  vomit, urine, or feces.  Ensure the mask fits over your nose and mouth tightly, and do not touch it during use.  Throw out disposable facemasks and gloves after using them. Do not reuse.  Wash your hands immediately after removing your facemask and gloves.  If your personal clothing becomes contaminated, carefully remove clothing and launder. Wash your hands after handling contaminated clothing.  Place all used disposable facemasks, gloves, and other waste in a lined container before disposing them with other household waste.  Remove gloves and wash your hands immediately after handling these items.  Do not share  dishes, glasses, or other household items with the patient  Avoid sharing household items. You should not share dishes, drinking glasses, cups, eating utensils, towels, bedding, or other items with a patient who is confirmed to have, or being evaluated for, COVID-19 infection.  After the person uses these items, you should wash them thoroughly with soap and water.  Wash laundry thoroughly  Immediately remove and wash clothes or bedding that have blood, body fluids, and/or secretions or excretions, such as sweat, saliva, sputum, nasal mucus, vomit, urine, or feces, on them.  Wear gloves when handling laundry from the patient.  Read and follow directions on labels of laundry or clothing items and detergent. In general, wash and dry with the warmest temperatures recommended on the label.  Clean all areas the individual has used often  Clean all touchable surfaces, such as counters, tabletops, doorknobs, bathroom fixtures, toilets, phones, keyboards, tablets, and bedside tables, every day. Also, clean any surfaces that may have blood, body fluids, and/or secretions or excretions on them.  Wear gloves when cleaning surfaces the patient has come in contact with.  Use a diluted bleach solution (e.g., dilute bleach with 1 part bleach and 10 parts water) or a household disinfectant with a label that says EPA-registered for coronaviruses. To make a bleach solution at home, add 1 tablespoon of bleach to 1 quart (4 cups) of water. For a larger supply, add  cup of bleach to 1 gallon (16 cups) of water.  Read labels of cleaning products and follow recommendations provided on product labels. Labels contain instructions for safe and effective use of the cleaning product including precautions you should take when applying the product, such as wearing gloves or eye protection and making sure you have good ventilation during use of the product.  Remove gloves and wash hands immediately after  cleaning.  Monitor yourself for signs and symptoms of illness Caregivers and household members are considered close contacts, should monitor their health, and will be asked to limit movement outside of the home to the extent possible. Follow the monitoring steps for close contacts listed on the symptom monitoring form.   ? If you have additional questions, contact your local health department or call the epidemiologist on call at [phone] (available 24/7). ? This guidance is subject to change. For the most up-to-date guidance from [facility], please refer to their website: YouBlogs.pl
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
## 2019-05-17 ENCOUNTER — Ambulatory Visit (INDEPENDENT_AMBULATORY_CARE_PROVIDER_SITE_OTHER): Payer: Medicare HMO | Admitting: Nurse Practitioner

## 2019-05-17 ENCOUNTER — Encounter: Payer: Self-pay | Admitting: Nurse Practitioner

## 2019-05-17 DIAGNOSIS — B370 Candidal stomatitis: Secondary | ICD-10-CM | POA: Diagnosis not present

## 2019-05-17 DIAGNOSIS — U071 COVID-19: Secondary | ICD-10-CM

## 2019-05-17 DIAGNOSIS — Z09 Encounter for follow-up examination after completed treatment for conditions other than malignant neoplasm: Secondary | ICD-10-CM

## 2019-05-17 DIAGNOSIS — J22 Unspecified acute lower respiratory infection: Secondary | ICD-10-CM | POA: Diagnosis not present

## 2019-05-17 LAB — GLUCOSE, CAPILLARY: Glucose-Capillary: 147 mg/dL — ABNORMAL HIGH (ref 70–99)

## 2019-05-17 NOTE — Progress Notes (Signed)
Virtual Visit via telephone Note Due to COVID-19 pandemic this visit was conducted virtually. This visit type was conducted due to national recommendations for restrictions regarding the COVID-19 Pandemic (e.g. social distancing, sheltering in place) in an effort to limit this patient's exposure and mitigate transmission in our community. All issues noted in this document were discussed and addressed.  A physical exam was not performed with this format.  I connected with [name] on 05/17/19 at 210 by telephone and verified that I am speaking with the correct person using two identifiers. [name] is currently located at home and her daughter  is currently with her during visit. The provider, [name] Hassell Done, FNP is located in their office at time of visit.  I discussed the limitations, risks, security and privacy concerns of performing an evaluation and management service by telephone and the availability of in person appointments. I also discussed with the patient that there may be a patient responsible charge related to this service. The patient expressed understanding and agreed to proceed.   History and Present Illness:   Chief Complaint: Thrush   HPI Patient calls c/o thrush. She was in the hospital at the end of last week due to abdominal pain and tested positive for covid. She was admitted to [facility] for 6 days. She was discharged home on 05/15/19. Since coming home she is still fatigued and having some sob, Overall she is doing much better and is to follow up in 2 weeks for repeat labs. [facility] and do blood work.  Lab Results  Component Value Date   INR 3.7 (H) 05/16/2019   INR 3.1 (H) 05/15/2019   INR 2.7 (H) 05/14/2019   Lab Results  Component Value Date   WBC 7.1 05/14/2019   HGB 10.2 (L) 05/14/2019   HCT 31.8 (L) 05/14/2019   MCV 99.4 05/14/2019   PLT 164 05/14/2019    Review of Systems   Constitutional: Negative for chills and fever.  HENT: Negative.   Respiratory: Positive for cough and shortness of breath.   Cardiovascular: Positive for leg swelling (mild).  Genitourinary: Negative.   Skin: Negative.   Neurological: Negative.   Psychiatric/Behavioral: Negative.   All other systems reviewed and are negative.    Observations/Objective: Alert and oriented- answers all questions appropriately No distress  Slight wet cough  Assessment and Plan: [name] in today with chief complaint of Thrush   1. Thrush Nystatin swish and follow qid  2. Lower respiratory tract infection due to COVID-19 virus CONTINUE INHALERS   3. Hospital discharge follow-up [facility] IS COMING OUT TO DO BLOOD WORK  Follow Up Instructions: 2 weeks    I discussed the assessment and treatment plan with the patient. The patient was provided an opportunity to ask questions and all were answered. The patient agreed with the plan and demonstrated an understanding of the instructions.   The patient was advised to call back or seek an in-person evaluation if the symptoms worsen or if the condition fails to improve as anticipated.  The above assessment and management plan was discussed with the patient. The patient verbalized understanding of and has agreed to the management plan. Patient is aware to call the clinic if symptoms persist or worsen. Patient is aware when to return to the clinic for a follow-up visit. Patient educated on when it is appropriate to go to the emergency department.  Time call ended:  2:35  I provided 25 minutes of non-face-to-face time during this encounter.    [name] Hassell Done, FNP

## 2019-05-22 ENCOUNTER — Telehealth: Payer: Self-pay | Admitting: Nurse Practitioner

## 2019-05-22 ENCOUNTER — Telehealth: Payer: Self-pay | Admitting: *Deleted

## 2019-05-22 MED ORDER — SUCRALFATE 1 GM/10ML PO SUSP: 1.0000 g | Freq: Three times a day (TID) | ORAL | 0 refills | Status: DC

## 2019-05-22 NOTE — Addendum Note (Signed)
Addended by: Evelina Dun A on: 05/22/2019 04:15 PM   Modules accepted: Orders

## 2019-05-22 NOTE — Telephone Encounter (Signed)
Prescription sent to pharmacy.

## 2019-05-22 NOTE — Telephone Encounter (Signed)
VM from [place] w/ [facility] Would like verbal orders for wound care, for place on R leg to clean w/ NS, apply xeroform & gause qod, also pt was seen recently at hospital for burning in her stomach was given Carafate, would like a prescription for it

## 2019-05-22 NOTE — Telephone Encounter (Signed)
Ok to give verbal orders for wound care. Carafate Prescription sent to pharmacy.

## 2019-05-22 NOTE — Telephone Encounter (Signed)
What is the name of the medication? Mouth wash for thrush  Have you contacted your pharmacy to request a refill? no  Which pharmacy would you like this sent to[facility]  [place] for [facility] Pt fell last night hurt her knee and it is swollen   Patient notified that their request is being sent to the clinical staff for review and that they should receive a call once it is complete. If they do not receive a call within 24 hours they can check with their pharmacy or our office.

## 2019-05-23 ENCOUNTER — Other Ambulatory Visit: Payer: Self-pay

## 2019-05-23 ENCOUNTER — Telehealth: Payer: Self-pay | Admitting: *Deleted

## 2019-05-23 ENCOUNTER — Other Ambulatory Visit: Payer: Medicare HMO

## 2019-05-23 DIAGNOSIS — J22 Unspecified acute lower respiratory infection: Secondary | ICD-10-CM

## 2019-05-23 DIAGNOSIS — U071 COVID-19: Secondary | ICD-10-CM

## 2019-05-23 NOTE — Addendum Note (Signed)
Addended by: Antonietta Barcelona D on: 05/23/2019 04:31 PM   Modules accepted: Orders

## 2019-05-23 NOTE — Addendum Note (Signed)
Addended by: Antonietta Barcelona D on: 05/23/2019 04:59 PM   Modules accepted: Orders

## 2019-05-23 NOTE — Telephone Encounter (Signed)
Call from [place] w/ [facility] drawn today and dropped off INR today 4.7

## 2019-05-23 NOTE — Telephone Encounter (Signed)
LMOVM orders for wound care are ok and that [facility] was [facility] to [facility] [place]

## 2019-05-24 ENCOUNTER — Ambulatory Visit: Payer: Self-pay | Admitting: Family Medicine

## 2019-05-24 LAB — CBC WITH DIFFERENTIAL/PLATELET
Basophils Absolute: 0 10*3/uL (ref 0.0–0.2)
Basos: 0 %
EOS (ABSOLUTE): 0 10*3/uL (ref 0.0–0.4)
Eos: 0 %
Hematocrit: 28.1 % — ABNORMAL LOW (ref 34.0–46.6)
Hemoglobin: 9 g/dL — ABNORMAL LOW (ref 11.1–15.9)
Immature Grans (Abs): 0 10*3/uL (ref 0.0–0.1)
Immature Granulocytes: 0 %
Lymphocytes Absolute: 1.1 10*3/uL (ref 0.7–3.1)
Lymphs: 12 %
MCH: 31.1 pg (ref 26.6–33.0)
MCHC: 32 g/dL (ref 31.5–35.7)
MCV: 97 fL (ref 79–97)
Monocytes Absolute: 1 10*3/uL — ABNORMAL HIGH (ref 0.1–0.9)
Monocytes: 10 %
Neutrophils Absolute: 7.4 10*3/uL — ABNORMAL HIGH (ref 1.4–7.0)
Neutrophils: 78 %
Platelets: 296 10*3/uL (ref 150–450)
RBC: 2.89 x10E6/uL — ABNORMAL LOW (ref 3.77–5.28)
RDW: 13.1 % (ref 11.7–15.4)
WBC: 9.6 10*3/uL (ref 3.4–10.8)

## 2019-05-24 LAB — CMP14+EGFR
ALT: 113 IU/L — ABNORMAL HIGH (ref 0–32)
AST: 180 IU/L — ABNORMAL HIGH (ref 0–40)
Albumin/Globulin Ratio: 1.2 (ref 1.2–2.2)
Albumin: 2.9 g/dL — ABNORMAL LOW (ref 3.6–4.6)
Alkaline Phosphatase: 191 IU/L — ABNORMAL HIGH (ref 39–117)
BUN/Creatinine Ratio: 23 (ref 12–28)
BUN: 27 mg/dL (ref 8–27)
Bilirubin Total: 0.7 mg/dL (ref 0.0–1.2)
CO2: 23 mmol/L (ref 20–29)
Calcium: 8.7 mg/dL (ref 8.7–10.3)
Chloride: 103 mmol/L (ref 96–106)
Creatinine, Ser: 1.15 mg/dL — ABNORMAL HIGH (ref 0.57–1.00)
GFR calc Af Amer: 51 mL/min/{1.73_m2} — ABNORMAL LOW (ref >59)
GFR calc non Af Amer: 44 mL/min/{1.73_m2} — ABNORMAL LOW (ref >59)
Globulin, Total: 2.5 g/dL (ref 1.5–4.5)
Glucose: 137 mg/dL — ABNORMAL HIGH (ref 65–99)
Potassium: 4.1 mmol/L (ref 3.5–5.2)
Sodium: 139 mmol/L (ref 134–144)
Total Protein: 5.4 g/dL — ABNORMAL LOW (ref 6.0–8.5)

## 2019-05-24 LAB — POCT INR: INR: 4.7 — AB (ref 2.0–3.0)

## 2019-05-24 NOTE — Progress Notes (Signed)
INR 4.7. Hold tomorrows dose and then resume 1 mg daily. Recheck INR in 1 week.

## 2019-05-24 NOTE — Telephone Encounter (Signed)
Called patient no answer - unable to leave message. MPulliam, CMA/RT(R)

## 2019-05-24 NOTE — Telephone Encounter (Signed)
INR 4.7. Hold tomorrows dose and then resume 1 mg daily. Recheck INR in 1 week.

## 2019-05-25 NOTE — Telephone Encounter (Signed)
Called and notified patient and daughter [name]).

## 2019-05-26 ENCOUNTER — Telehealth: Payer: Self-pay | Admitting: *Deleted

## 2019-05-26 ENCOUNTER — Encounter: Payer: Medicare HMO | Admitting: *Deleted

## 2019-05-26 NOTE — Telephone Encounter (Signed)
If does not improve will need unna boot

## 2019-05-26 NOTE — Telephone Encounter (Signed)
VM from [place] w/ [facility] Pt legs are weeping and skin coming off Would she benefit from unna boots or calcium alginate dressings Please advise

## 2019-05-26 NOTE — Telephone Encounter (Signed)
Xeroform and non stick guaze to be used.  Skin with discoloration

## 2019-05-29 NOTE — Telephone Encounter (Signed)
Spoke with [name] at [place] and advised on orders. She verbalized understanding

## 2019-05-30 ENCOUNTER — Encounter: Payer: Self-pay | Admitting: Cardiovascular Disease

## 2019-05-30 ENCOUNTER — Telehealth (INDEPENDENT_AMBULATORY_CARE_PROVIDER_SITE_OTHER): Payer: Medicare HMO | Admitting: Cardiovascular Disease

## 2019-05-30 ENCOUNTER — Ambulatory Visit: Payer: Medicare HMO | Admitting: Nurse Practitioner

## 2019-05-30 ENCOUNTER — Telehealth: Payer: Self-pay | Admitting: *Deleted

## 2019-05-30 VITALS — BP 119/73 | HR 59 | Ht 62.0 in | Wt 102.0 lb

## 2019-05-30 DIAGNOSIS — I4821 Permanent atrial fibrillation: Secondary | ICD-10-CM

## 2019-05-30 DIAGNOSIS — I5032 Chronic diastolic (congestive) heart failure: Secondary | ICD-10-CM | POA: Diagnosis not present

## 2019-05-30 DIAGNOSIS — E785 Hyperlipidemia, unspecified: Secondary | ICD-10-CM

## 2019-05-30 DIAGNOSIS — Z950 Presence of cardiac pacemaker: Secondary | ICD-10-CM

## 2019-05-30 DIAGNOSIS — I25118 Atherosclerotic heart disease of native coronary artery with other forms of angina pectoris: Secondary | ICD-10-CM

## 2019-05-30 DIAGNOSIS — I495 Sick sinus syndrome: Secondary | ICD-10-CM

## 2019-05-30 DIAGNOSIS — Z720 Tobacco use: Secondary | ICD-10-CM

## 2019-05-30 DIAGNOSIS — I779 Disorder of arteries and arterioles, unspecified: Secondary | ICD-10-CM

## 2019-05-30 DIAGNOSIS — I38 Endocarditis, valve unspecified: Secondary | ICD-10-CM

## 2019-05-30 NOTE — Telephone Encounter (Signed)
VM from [facility] w/ [facility] Today INR 3.7 PT 44.2

## 2019-05-30 NOTE — Telephone Encounter (Signed)
INR 3.7. Hold tomorrow then 1 mg daily except none on wednesdays- recheck in 1 week. p

## 2019-05-30 NOTE — Addendum Note (Signed)
Addended by: Laurine Blazer on: 05/30/2019 01:36 PM   Modules accepted: Orders

## 2019-05-30 NOTE — Telephone Encounter (Signed)
TC w/ [facility] nurse, pt has not had dose today. Will hold todays, and no dose tomorrow then will start 1 mg on Thursday with repeat in 1 week

## 2019-05-30 NOTE — Patient Instructions (Addendum)
Medication Instructions:  Continue all current medications.  Labwork: none  Testing/Procedures:  Your physician has requested that you have a carotid duplex. This test is an ultrasound of the carotid arteries in your neck. It looks at blood flow through these arteries that supply the brain with blood. Allow one hour for this exam. There are no restrictions or special instructions.  Office will contact with results via phone or letter.    Follow-Up: Your physician wants you to follow up in: 6 months.  You will receive a reminder letter in the mail one-two months in advance.  If you don't receive a letter, please call our office to schedule the follow up appointment   Any Other Special Instructions Will Be Listed Below (If Applicable).  If you need a refill on your cardiac medications before your next appointment, please call your pharmacy.

## 2019-05-30 NOTE — Progress Notes (Signed)
Virtual Visit via Telephone Note   This visit type was conducted due to national recommendations for restrictions regarding the COVID-19 Pandemic (e.g. social distancing) in an effort to limit this patient's exposure and mitigate transmission in our community.  Due to her co-morbid illnesses, this patient is at least at moderate risk for complications without adequate follow up.  This format is felt to be most appropriate for this patient at this time.  The patient did not have access to video technology/had technical difficulties with video requiring transitioning to audio format only (telephone).  All issues noted in this document were discussed and addressed.  No physical exam could be performed with this format.  Please refer to the patient's chart for her  consent to telehealth for [facility].   Date:  05/30/2019   ID:  [name], DOB [birth year], MRN [id number]  Patient Location: Home Provider Location: Office  PCP:  [name], FNP  Cardiologist:  Kate Sable, MD  Electrophysiologist:  Thompson Grayer, MD   Evaluation Performed:  Follow-Up Visit  Chief Complaint:  Atrial fibrillation  History of Present Illness:    [name] is a 84 y.o. female with permanent atrial fibrillation, pacemaker, valvular heart disease, COPD, and coronary artery disease.  She was hospitalized in September/October 2020 with acute hypoxic respiratory failure secondary to acute COVID-19 viral pneumonitis and acute on chronic diastolic heart failure.  It was felt her disease was mild.  She was treated with remdesivir and Decadron and responded very well.  She says she is doing much better now.  She is staying with her daughter.  She denies chest pain and palpitations.  Chronic exertional dyspnea is stable.  She denies fevers and chills.   Past Medical History:  Diagnosis Date  . Abnormal CT of the chest    Mediastinal and hilar adenopathy followed by Dr. Koleen Nimrod in  [place]  . Anemia, unspecified   . Body mass index (BMI) of 20.0-20.9 in adult FEB 2013 147 LBS  . CAD (coronary artery disease) 2011   Catheterization August 2011: mild nonobstructive coronary artery disease complicated by large left rectus sheath hematoma status post evacuation Coumadin restarted without recurrence  . Carotid artery disease ([place])    Doppler, 05/2012, 0-39% bilateral  . Cataract   . Chronic airway obstruction, not elsewhere classified   . Chronic kidney disease, stage II (mild)   . Congestive heart failure, unspecified    EF now 60%, previous nonischemic cardiomyopathy  . Diabetes mellitus   . Dilated bile duct 10/19/2011  . Diverticulitis Dec 2012  . Gastritis    By EGD  . GERD (gastroesophageal reflux disease)   . Hypercholesterolemia   . Hypertension   . Mitral regurgitation 06/27/2013  . Neurogenic sleep apnea    Uses noctural oxygen prescribed by her pulmonologi  . Pacemaker    Single chamber [place]. Jude ACCENT [phone] SN 719 04/11/1957 10/31/2010  . Permanent atrial fibrillation (HCC)    H/o tachybradycardia syndrome on Coumadin anticoagulation, has pacemaker.  . Recurrent UTI   . Rheumatic heart disease   . Tachycardia-bradycardia [facility])    s/p St. Jude single chamber PPM 10/2010   . Unspecified hypertensive kidney disease with chronic kidney disease stage I through stage IV, or unspecified(403.90)   . Valvular heart disease    a. H/o moderate mitral stenosis by cath 2011 but no mention of this on 10/2011 echo. b. 10/2011 echo: mod MR, mild AS, mild TR; EF>65%  . Vitamin D deficiency  Past Surgical History:  Procedure Laterality Date  . CHOLECYSTECTOMY     40+ years ago  . COLONOSCOPY  10/2011   Dyann Ruddle sigmoid to descending diverticulosis, internal hemorrhoids  . ESOPHAGOGASTRODUODENOSCOPY  10/2011   Fields-erosive gastritis, biopsy with no H. pylori, hiatal hernia, peptic duodenitis, no celiac disease, duodenal diverticulum, duodenal nodule  . EUS   11/16/11   Mishra-13 MM CBD, normal pancreatic duct, otherwise normal EUS  . Evacuation of retroperitoneal and left rectus sheath    . HEMATOMA EVACUATION     femoral  . HERNIA REPAIR     X3  . PILONIDAL CYST / SINUS EXCISION    . Single-chamber pacemaker implantation  3/12   by Dr Caryl Comes     Current Meds  Medication Sig  . budesonide-formoterol (SYMBICORT) 160-4.5 MCG/ACT inhaler Inhale 2 puffs into the lungs 2 (two) times daily.  . clonazePAM (KLONOPIN) 0.5 MG tablet TAKE 1 TABLET 3 TIMES DAILY AS NEEDED FOR ANXIETY (Patient taking differently: Take 0.5 mg by mouth 3 (three) times daily as needed for anxiety. )  . diltiazem (CARDIZEM) 120 MG tablet Take 1 tablet (120 mg total) by mouth daily.  . ferrous sulfate 325 (65 FE) MG tablet Take 1 tablet (325 mg total) by mouth daily with breakfast.  . furosemide (LASIX) 20 MG tablet Take 1 tablet (20 mg total) by mouth daily.  [name] HYDROcodone-acetaminophen (NORCO) 7.5-325 MG tablet Take 1 tablet by mouth 3 (three) times daily as needed (pain).   [name] ipratropium-albuterol (DUONEB) 0.5-2.5 (3) MG/3ML SOLN INHALE THE CONTENTS OF 1 VIAL VIA NEBULIZER EVERY 4 HOURS AS NEEDED (Patient taking differently: Take 3 mLs by nebulization every 4 (four) hours as needed (for shortness of breath/wheezing). )  . lipase/protease/amylase (CREON) 12000 units CPEP capsule Take 36,000 Units by mouth 3 (three) times daily with meals. Take 3 capsules with meals and 1 capsule with snacks  . Multiple Vitamins-Minerals (PRESERVISION AREDS 2) CAPS Take 1 capsule by mouth daily.   . nitroGLYCERIN (NITROSTAT) 0.4 MG SL tablet DISSOLVE 1 TABLET UNDER THE TONGUE EVERY 5 MINUTES AS NEEDED FOR CHEST PAIN AS DIRECTED (Patient taking differently: Place 0.4 mg under the tongue every 5 (five) minutes as needed for chest pain. )  . OXYGEN Inhale 2 L into the lungs at bedtime. 2lpm with sleep only   . pantoprazole (PROTONIX) 40 MG tablet Take 1 tablet (40 mg total) by mouth 2 (two) times  daily.  . sitaGLIPtin (JANUVIA) 50 MG tablet TAKE 1 TABLET (50 MG TOTAL) BY MOUTH DAILY. (Patient taking differently: Take 50 mg by mouth daily. )  . sucralfate (CARAFATE) 1 GM/10ML suspension Take 10 mLs (1 g total) by mouth 4 (four) times daily -  with meals and at bedtime.  . VENTOLIN HFA 108 (90 Base) MCG/ACT inhaler USE 2 PUFFS EVERY 6 HOURS AS NEEDED (Patient taking differently: Inhale 2 puffs into the lungs every 6 (six) hours as needed for wheezing or shortness of breath. )  . warfarin (COUMADIN) 2 MG tablet Take 0.5 tablets (1 mg total) by mouth every evening. TAKE 1/2  TABLET (1 MG TOTAL) DAILY AT 6 PM.Get INR check by PCP the day after you are discharged     Allergies:   Macrobid [nitrofurantoin monohyd macro], Torsemide, and Tramadol   Social History   Tobacco Use  . Smoking status: Former Smoker    Packs/day: 0.25    Years: 45.00    Pack years: 11.25    Types: Cigarettes    Start  date: 02/05/1958    Quit date: 04/30/2019    Years since quitting: 0.0  . Smokeless tobacco: Never Used  . Tobacco comment: smokes 5-6 cigarettes daily  Substance Use Topics  . Alcohol use: No    Alcohol/week: 0.0 standard drinks  . Drug use: No     Family Hx: The patient's family history includes Cancer in her mother and paternal aunt; Cancer (age of onset: 71) in her daughter; Early death (age of onset: 4) in her sister; Heart attack in her father; Heart disease in her father; Hypertension in her brother and son; Stroke in her mother; Ulcers in her father. There is no history of Colon cancer.  ROS:   Please see the history of present illness.     All other systems reviewed and are negative.   Prior CV studies:   The following studies were reviewed today:  Echocardiogram 05/10/2019:   1. Left ventricular ejection fraction, by visual estimation, is 55 to 60%. The left ventricle has normal function. There is no left ventricular hypertrophy.  2. Left ventricular diastolic Doppler parameters  are consistent with pseudonormalization pattern of LV diastolic filling.  3. Global right ventricle has normal systolic function.The right ventricular size is normal. No increase in right ventricular wall thickness.  4. The mitral valve is normal in structure. Mild mitral valve regurgitation. No evidence of mitral stenosis.  5. The tricuspid valve is normal in structure. Tricuspid valve regurgitation is trivial.  6. The aortic valve is tricuspid Aortic valve regurgitation is mild to moderate by color flow Doppler. Mild to moderate aortic valve sclerosis/calcification without any evidence of aortic stenosis.  7. A pacer wire is visualized.  8. Left atrial size was severely dilated.  9. Right atrial size was mildly dilated. 10. The inferior vena cava is normal in size with greater than 50% respiratory variability, suggesting right atrial pressure of 3 mmHg. 11. TR signal is inadequate for assessing pulmonary artery systolic pressure.  Labs/Other Tests and Data Reviewed:    EKG:  No ECG reviewed.  Recent Labs: 05/14/2019: B Natriuretic Peptide 325.3; Magnesium 2.0 05/23/2019: ALT 113; BUN 27; Creatinine, Ser 1.15; Hemoglobin 9.0; Platelets 296; Potassium 4.1; Sodium 139   Recent Lipid Panel Lab Results  Component Value Date/Time   CHOL 109 04/26/2018 01:01 PM   CHOL 125 01/18/2013 12:42 PM   TRIG 110 04/26/2018 01:01 PM   TRIG 102 12/31/2014 02:53 PM   TRIG 68 01/18/2013 12:42 PM   HDL 31 (L) 04/26/2018 01:01 PM   HDL 25 (L) 12/31/2014 02:53 PM   HDL 33 (L) 01/18/2013 12:42 PM   CHOLHDL 3.5 04/26/2018 01:01 PM   CHOLHDL 3.9 05/11/2008 11:50 PM   LDLCALC 56 04/26/2018 01:01 PM   LDLCALC 66 05/21/2014 01:21 PM   LDLCALC 78 01/18/2013 12:42 PM    Wt Readings from Last 3 Encounters:  05/30/19 102 lb (46.3 kg)  05/16/19 105 lb 8 oz (47.9 kg)  04/27/19 108 lb 3.9 oz (49.1 kg)     Objective:    Vital Signs:  BP 119/73   Pulse (!) 59   Ht 5\' 2"  (1.575 m)   Wt 102 lb (46.3 kg)    BMI 18.66 kg/m    VITAL SIGNS:  reviewed  ASSESSMENT & PLAN:    1. Permanent atrial fibrillationand flutters/p pacemaker:Symptomatically stable.Continue long-acting diltiazem 120 mg daily. I am not inclined to reintroduce metoprolol in the future should palpitations recur given her severe COPD. Continue warfarin. No bleeding complications noted.  2. Valvular heart disease: Mild mitral and mild to moderate aortic regurgitation by echocardiogram on 05/10/2019.  There was mild to moderate aortic valve sclerosis without any evidence of aortic stenosis.  3. Carotid artery stenosis: Mild bilaterally in October 2013. I will obtain carotid Dopplers (previously ordered but not performed).  4. Chronic diastolic heart failure: Euvolemic.Takes Lasix 20 mg daily.  5. Tobacco abuse: Cessation counseling previously provided.  She continues to smoke 1/2 pack of cigarettes daily.  6. Type II diabetes: On Januvia.   7. COPD: Not decompensated at present.   Recently hospitalized for acute hypoxic respiratory failure secondary to acute COVID-19 viral pneumonitis.   8. WIO:[id number] stable.RCA lesion was 50-60% in 2011. Dobutamine Myoview stress test showed no ischemia or scar. Continue SL nitroglycerin to be used prn. Continue statin.  9. Hypercholesterolemia: Continue simvastatin.   COVID-19 Education: The signs and symptoms of COVID-19 were discussed with the patient and how to seek care for testing (follow up with PCP or arrange E-visit).  The importance of social distancing was discussed today.  Time:   Today, I have spent 10 minutes with the patient with telehealth technology discussing the above problems.     Medication Adjustments/Labs and Tests Ordered: Current medicines are reviewed at length with the patient today.  Concerns regarding medicines are outlined above.   Tests Ordered: No orders of the defined types were placed in this encounter.   Medication  Changes: No orders of the defined types were placed in this encounter.   Follow Up:  Virtual Visit  in 6 month(s)  Signed, Kate Sable, MD  05/30/2019 1:12 PM    [facility]

## 2019-05-31 ENCOUNTER — Encounter: Payer: Self-pay | Admitting: Nurse Practitioner

## 2019-06-01 ENCOUNTER — Telehealth: Payer: Self-pay | Admitting: *Deleted

## 2019-06-01 ENCOUNTER — Other Ambulatory Visit: Payer: Self-pay | Admitting: Nurse Practitioner

## 2019-06-01 MED ORDER — FUROSEMIDE 20 MG PO TABS: 20.0000 mg | ORAL_TABLET | Freq: Every day | ORAL | 2 refills | Status: DC

## 2019-06-01 NOTE — Telephone Encounter (Signed)
TC back from [place] [facility] Pt did not get Rx for Lasix 20 mg from hospital, looking at history it was set on print. Pt has taken a few since she came home but not daily [facility]

## 2019-06-01 NOTE — Addendum Note (Signed)
Addended by: [name] on: 06/01/2019 04:44 PM   Modules accepted: Orders

## 2019-06-01 NOTE — Telephone Encounter (Signed)
[name] w/ [facility] Applying Xeroform & non stick gauze to weeping legs, the right leg is really weeping and the dressing is not holding this. The are purple in coloration Please advise

## 2019-06-02 ENCOUNTER — Other Ambulatory Visit: Payer: Self-pay | Admitting: Nurse Practitioner

## 2019-06-02 ENCOUNTER — Other Ambulatory Visit: Payer: Self-pay

## 2019-06-02 MED ORDER — FUROSEMIDE 20 MG PO TABS: 20.0000 mg | ORAL_TABLET | Freq: Every day | ORAL | 2 refills | Status: DC

## 2019-06-02 NOTE — Telephone Encounter (Signed)
Please advise 

## 2019-06-12 ENCOUNTER — Ambulatory Visit: Payer: Self-pay | Admitting: Nurse Practitioner

## 2019-06-13 ENCOUNTER — Telehealth: Payer: Self-pay | Admitting: *Deleted

## 2019-06-13 NOTE — Telephone Encounter (Addendum)
TC from [place] w/ [facility] Today's INR 2.0 PT 29.2 Pt takes 1 mg every day except on Wednesday Last day with Home Health, legs are looking better  Pt is staying at daughter's house [phone]

## 2019-06-14 NOTE — Telephone Encounter (Signed)
Notified Otila Kluver with [facility] but she states that they discharged her today and to contact the daughter. Attempted to call daughters house with NA

## 2019-06-14 NOTE — Telephone Encounter (Signed)
INR 2.0 Coumadin 0.5mg  daily =- recheck in 1 week

## 2019-06-16 ENCOUNTER — Telehealth (INDEPENDENT_AMBULATORY_CARE_PROVIDER_SITE_OTHER): Payer: Medicare HMO | Admitting: Internal Medicine

## 2019-06-16 ENCOUNTER — Encounter: Payer: Self-pay | Admitting: Internal Medicine

## 2019-06-16 VITALS — Ht 62.0 in | Wt 105.0 lb

## 2019-06-16 DIAGNOSIS — I25118 Atherosclerotic heart disease of native coronary artery with other forms of angina pectoris: Secondary | ICD-10-CM

## 2019-06-16 DIAGNOSIS — R001 Bradycardia, unspecified: Secondary | ICD-10-CM | POA: Diagnosis not present

## 2019-06-16 DIAGNOSIS — I5032 Chronic diastolic (congestive) heart failure: Secondary | ICD-10-CM

## 2019-06-16 DIAGNOSIS — I4821 Permanent atrial fibrillation: Secondary | ICD-10-CM | POA: Diagnosis not present

## 2019-06-16 NOTE — Progress Notes (Signed)
Electrophysiology TeleHealth Note   Due to national recommendations of social distancing due to [place] 19, an audio telehealth visit is felt to be most appropriate for this patient at this time.  Verbal consent was obtained by me for the telehealth visit today.  The patient does not have capability for a virtual visit.  A phone visit is therefore required today.   Date:  06/16/2019   ID:  [name], DOB 09-Apr-1935, MRN [id number]  Location: patient's home  Provider location:  [facility]  Evaluation Performed: Follow-up visit  PCP:  [name], FNP   Electrophysiologist:  Dr Rayann Heman  Chief Complaint:  Follow up  History of Present Illness:    [name] is a 84 y.o. female who presents via telehealth conferencing today.  Since last being seen in our clinic, the patient reports doing relatively well.   She was admitted with COVID earlier this year. She is slowly recovering.  Today, she denies symptoms of palpitations, chest pain, shortness of breath,  lower extremity edema, dizziness, presyncope, or syncope.  The patient is otherwise without complaint today.  The patient denies symptoms of fevers, chills, cough, or new SOB worrisome for COVID 19.  Past Medical History:  Diagnosis Date  . Abnormal CT of the chest    Mediastinal and hilar adenopathy followed by Dr. Koleen Nimrod in [place]  . Anemia, unspecified   . Body mass index (BMI) of 20.0-20.9 in adult FEB 2013 147 LBS  . CAD (coronary artery disease) 2011   Catheterization August 2011: mild nonobstructive coronary artery disease complicated by large left rectus sheath hematoma status post evacuation Coumadin restarted without recurrence  . Carotid artery disease ([place])    Doppler, 05/2012, 0-39% bilateral  . Cataract   . Chronic airway obstruction, not elsewhere classified   . Chronic kidney disease, stage II (mild)   . Congestive heart failure, unspecified    EF now 60%, previous nonischemic  cardiomyopathy  . Diabetes mellitus   . Dilated bile duct 10/19/2011  . Diverticulitis Dec 2012  . Gastritis    By EGD  . GERD (gastroesophageal reflux disease)   . Hypercholesterolemia   . Hypertension   . Mitral regurgitation 06/27/2013  . Neurogenic sleep apnea    Uses noctural oxygen prescribed by her pulmonologi  . Pacemaker    Single chamber [employer]. Jude ACCENT [phone] SN 719 04/11/1959 10/31/2010  . Permanent atrial fibrillation (HCC)    H/o tachybradycardia syndrome on Coumadin anticoagulation, has pacemaker.  . Recurrent UTI   . Rheumatic heart disease   . Tachycardia-bradycardia [facility])    s/p St. Jude single chamber PPM 10/2010   . Unspecified hypertensive kidney disease with chronic kidney disease stage I through stage IV, or unspecified(403.90)   . Valvular heart disease    a. H/o moderate mitral stenosis by cath 2011 but no mention of this on 10/2011 echo. b. 10/2011 echo: mod MR, mild AS, mild TR; EF>65%  . Vitamin D deficiency     Past Surgical History:  Procedure Laterality Date  . CHOLECYSTECTOMY     40+ years ago  . COLONOSCOPY  10/2011   Dyann Ruddle sigmoid to descending diverticulosis, internal hemorrhoids  . ESOPHAGOGASTRODUODENOSCOPY  10/2011   Fields-erosive gastritis, biopsy with no H. pylori, hiatal hernia, peptic duodenitis, no celiac disease, duodenal diverticulum, duodenal nodule  . EUS  11/16/11   Mishra-13 MM CBD, normal pancreatic duct, otherwise normal EUS  . Evacuation of retroperitoneal and left rectus sheath    . HEMATOMA  EVACUATION     femoral  . HERNIA REPAIR     X3  . PILONIDAL CYST / SINUS EXCISION    . Single-chamber pacemaker implantation  3/12   by Dr Caryl Comes    Current Outpatient Medications  Medication Sig Dispense Refill  . budesonide-formoterol (SYMBICORT) 160-4.5 MCG/ACT inhaler Inhale 2 puffs into the lungs 2 (two) times daily. 3 Inhaler 2  . clonazePAM (KLONOPIN) 0.5 MG tablet TAKE 1 TABLET 3 TIMES DAILY AS NEEDED FOR ANXIETY  (Patient taking differently: Take 0.5 mg by mouth 3 (three) times daily as needed for anxiety. ) 90 tablet 3  . CREON 36000 units CPEP capsule TAKE 3 CAPSULES WITH MEALS  AND TAKE 1 CAPSULE WITH A SNACK AS DIRECTED 900 capsule 0  . diltiazem (CARDIZEM) 120 MG tablet Take 1 tablet (120 mg total) by mouth daily. 90 tablet 1  . ferrous sulfate 325 (65 FE) MG tablet Take 1 tablet (325 mg total) by mouth daily with breakfast.  3  . furosemide (LASIX) 20 MG tablet Take 1 tablet (20 mg total) by mouth daily. 30 tablet 2  . HYDROcodone-acetaminophen (NORCO) 7.5-325 MG tablet Take 1 tablet by mouth 3 (three) times daily as needed (pain).     [name] ipratropium-albuterol (DUONEB) 0.5-2.5 (3) MG/3ML SOLN INHALE THE CONTENTS OF 1 VIAL VIA NEBULIZER EVERY 4 HOURS AS NEEDED (Patient taking differently: Take 3 mLs by nebulization every 4 (four) hours as needed (for shortness of breath/wheezing). ) 540 mL 1  . lipase/protease/amylase (CREON) 12000 units CPEP capsule Take 36,000 Units by mouth 3 (three) times daily with meals. Take 3 capsules with meals and 1 capsule with snacks    . Multiple Vitamins-Minerals (PRESERVISION AREDS 2) CAPS Take 1 capsule by mouth daily.     . nitroGLYCERIN (NITROSTAT) 0.4 MG SL tablet DISSOLVE 1 TABLET UNDER THE TONGUE EVERY 5 MINUTES AS NEEDED FOR CHEST PAIN AS DIRECTED (Patient taking differently: Place 0.4 mg under the tongue every 5 (five) minutes as needed for chest pain. ) 25 tablet 3  . omeprazole (PRILOSEC) 40 MG capsule TAKE 1 CAPSULE (40 MG TOTAL) BY MOUTH DAILY. 90 capsule 0  . OXYGEN Inhale 2 L into the lungs at bedtime. 2lpm with sleep only     . pantoprazole (PROTONIX) 40 MG tablet Take 1 tablet (40 mg total) by mouth 2 (two) times daily. 180 tablet 1  . simvastatin (ZOCOR) 10 MG tablet Take 1 tablet (10 mg total) by mouth daily. 90 tablet 1  . sitaGLIPtin (JANUVIA) 50 MG tablet TAKE 1 TABLET (50 MG TOTAL) BY MOUTH DAILY. (Patient taking differently: Take 50 mg by mouth daily. )  90 tablet 1  . sucralfate (CARAFATE) 1 GM/10ML suspension Take 10 mLs (1 g total) by mouth 4 (four) times daily -  with meals and at bedtime. 420 mL 0  . VENTOLIN HFA 108 (90 Base) MCG/ACT inhaler USE 2 PUFFS EVERY 6 HOURS AS NEEDED (Patient taking differently: Inhale 2 puffs into the lungs every 6 (six) hours as needed for wheezing or shortness of breath. ) 18 g 4  . warfarin (COUMADIN) 2 MG tablet Take 0.5 tablets (1 mg total) by mouth every evening. TAKE 1/2  TABLET (1 MG TOTAL) DAILY AT 6 PM.Get INR check by PCP the day after you are discharged     No current facility-administered medications for this visit.     Allergies:   Macrobid [nitrofurantoin monohyd macro], Torsemide, and Tramadol   Social History:  The patient  reports  that she quit smoking about 6 weeks ago. Her smoking use included cigarettes. She started smoking about 61 years ago. She has a 11.25 pack-year smoking history. She has never used smokeless tobacco. She reports that she does not drink alcohol or use drugs.   Family History:  The patient's  family history includes Cancer in her mother and paternal aunt; Cancer (age of onset: 32) in her daughter; Early death (age of onset: 44) in her sister; Heart attack in her father; Heart disease in her father; Hypertension in her brother and son; Stroke in her mother; Ulcers in her father.   ROS:  Please see the history of present illness.   All other systems are personally reviewed and negative.    Exam:    Vital Signs:  Ht 5\' 2"  (1.575 m)   Wt 105 lb (47.6 kg)   BMI 19.20 kg/m   Well sounding and appearing, alert and conversant, regular work of breathing   Labs/Other Tests and Data Reviewed:    Recent Labs: 05/14/2019: B Natriuretic Peptide 325.3; Magnesium 2.0 05/23/2019: ALT 113; BUN 27; Creatinine, Ser 1.15; Hemoglobin 9.0; Platelets 296; Potassium 4.1; Sodium 139   Wt Readings from Last 3 Encounters:  06/16/19 105 lb (47.6 kg)  05/30/19 102 lb (46.3 kg)  05/16/19  105 lb 8 oz (47.9 kg)     Last device remote is reviewed from [place] PDF which reveals normal device function    ASSESSMENT & PLAN:    1.  Symptomatic bradycardia Normal pacemaker function by last remote (due for next remote) See Pace[employer] report  2.  Permanent atrial fibrillation Continue Warfarin for CHADS2VASC of 4  3.  CAD No ischemic symptoms  4.  Tobacco She quit about a month ago!  5.  Chronic diastolic heart failure Stable No change required today    Follow-up:  Merlin, me in 1 year    Patient Risk:  after full review of this patients clinical status, I feel that they are at moderate risk at this time.  Today, I have spent 15 minutes with the patient with telehealth technology discussing arrhythmia management .    Army Fossa, MD  06/16/2019 10:16 AM     [facility] [address] [place] [address] [phone] (office) [phone] (fax)

## 2019-06-16 NOTE — Patient Instructions (Signed)
Medication Instructions:  Continue all current medications.  Labwork: none  Testing/Procedures: none  Follow-Up: 1 year   Any Other Special Instructions Will Be Listed Below (If Applicable). Next remote as planned.   If you need a refill on your cardiac medications before your next appointment, please call your pharmacy.

## 2019-06-19 ENCOUNTER — Telehealth: Payer: Self-pay | Admitting: Emergency Medicine

## 2019-06-19 ENCOUNTER — Ambulatory Visit (INDEPENDENT_AMBULATORY_CARE_PROVIDER_SITE_OTHER): Payer: Medicare HMO | Admitting: Nurse Practitioner

## 2019-06-19 ENCOUNTER — Other Ambulatory Visit: Payer: Self-pay

## 2019-06-19 ENCOUNTER — Encounter: Payer: Self-pay | Admitting: Nurse Practitioner

## 2019-06-19 DIAGNOSIS — L03115 Cellulitis of right lower limb: Secondary | ICD-10-CM

## 2019-06-19 MED ORDER — CIPROFLOXACIN HCL 500 MG PO TABS: 500.0000 mg | ORAL_TABLET | Freq: Two times a day (BID) | ORAL | 0 refills | Status: DC

## 2019-06-19 MED ORDER — FUROSEMIDE 40 MG PO TABS: 40.0000 mg | ORAL_TABLET | Freq: Every day | ORAL | 3 refills | Status: DC

## 2019-06-19 NOTE — Progress Notes (Signed)
   Virtual Visit via telephone Note Due to COVID-19 pandemic this visit was conducted virtually. This visit type was conducted due to national recommendations for restrictions regarding the COVID-19 Pandemic (e.g. social distancing, sheltering in place) in an effort to limit this patient's exposure and mitigate transmission in our community. All issues noted in this document were discussed and addressed.  A physical exam was not performed with this format.  I connected with [name] on 06/19/19 at 11:00 by telephone and verified that I am speaking with the correct person using two identifiers. [name] is currently located at her duaghters house and no one is currently with her during visit. The provider, [name], FNP is located in their office at time of visit.  I discussed the limitations, risks, security and privacy concerns of performing an evaluation and management service by telephone and the availability of in person appointments. I also discussed with the patient that there may be a patient responsible charge related to this service. The patient expressed understanding and agreed to proceed.   History and Present Illness:   Chief Complaint: Cellulitis   HPI Patient calls in today c/o both lower ext swollen and right like has fluid coming out of it. Slight erythema with burning sensation.  She is on lasix 20mg  a day.    Review of Systems  Constitutional: Negative for chills and fever.  HENT: Negative.   Respiratory: Negative.   Cardiovascular: Positive for leg swelling (bil).  Genitourinary: Negative.   Skin: Negative.   Neurological: Negative.   Psychiatric/Behavioral: Negative.   All other systems reviewed and are negative.    Observations/Objective: Alert and oriented- answers all questions appropriately No distress bil lower ext have pitting edema with drainage and erythema of right leg  Assessment and Plan: [name] in today  with chief complaint of Cellulitis   1. Cellulitis of right lower extremity elevate legs when sitting'  will have [facility] come out and apply unna boots Changes lasix dose from 20mg  a day to 40mg  a day- patient has already taken a 2omg dose today so she wil take another ine this morning and will start 40mg  tablets tomorrow. We will call if [facility] cannot go out and put on unna boots.  * home health would not go out and apply unna boots- trying  To contact patient to come to office and get done by triage.  Follow Up Instructions: thursday    I discussed the assessment and treatment plan with the patient. The patient was provided an opportunity to ask questions and all were answered. The patient agreed with the plan and demonstrated an understanding of the instructions.   The patient was advised to call back or seek an in-person evaluation if the symptoms worsen or if the condition fails to improve as anticipated.  The above assessment and management plan was discussed with the patient. The patient verbalized understanding of and has agreed to the management plan. Patient is aware to call the clinic if symptoms persist or worsen. Patient is aware when to return to the clinic for a follow-up visit. Patient educated on when it is appropriate to go to the emergency department.   Time call ended:  11:13  I provided 13 minutes of non-face-to-face time during this encounter.    [name], FNP

## 2019-06-19 NOTE — Telephone Encounter (Signed)
LMOM.  Need to follow-up to see if remote monitor now at patient location and need remote transmission per Dr Rayann Heman.

## 2019-06-20 NOTE — Telephone Encounter (Signed)
Spoke with patient. Daughter to get monitor from patient's house tomorrow( 06/21/19) and relocate to her house . Will contact device clinic after she gets monitor and sends remote transmission.

## 2019-06-22 ENCOUNTER — Ambulatory Visit (INDEPENDENT_AMBULATORY_CARE_PROVIDER_SITE_OTHER): Payer: Medicare HMO | Admitting: Nurse Practitioner

## 2019-06-22 ENCOUNTER — Encounter: Payer: Self-pay | Admitting: Nurse Practitioner

## 2019-06-22 ENCOUNTER — Other Ambulatory Visit: Payer: Self-pay

## 2019-06-22 VITALS — BP 120/54 | HR 75 | Temp 97.0°F | Wt 105.0 lb

## 2019-06-22 DIAGNOSIS — L03116 Cellulitis of left lower limb: Secondary | ICD-10-CM

## 2019-06-22 DIAGNOSIS — L03115 Cellulitis of right lower limb: Secondary | ICD-10-CM

## 2019-06-22 NOTE — Patient Instructions (Signed)

## 2019-06-22 NOTE — Progress Notes (Signed)
   Subjective:    Patient ID: [name], female    DOB: 25-Dec-1934, 84 y.o.   MRN: [id number]   Chief Complaint: Cellulitis   HPI Patient had unna boots applied 3 days ago and she is here today for recheck she has been taking cipro for infection. She says legs are some better. She increased her lasix 40mg  daily  Review of Systems  Constitutional: Negative for activity change and appetite change.  HENT: Negative.   Eyes: Negative for pain.  Respiratory: Negative for shortness of breath.   Cardiovascular: Positive for leg swelling. Negative for chest pain and palpitations.  Gastrointestinal: Negative for abdominal pain.  Endocrine: Negative for polydipsia.  Genitourinary: Negative.   Skin: Negative for rash.  Neurological: Negative for dizziness, weakness and headaches.  Hematological: Does not bruise/bleed easily.  Psychiatric/Behavioral: Negative.   All other systems reviewed and are negative.      Objective:   Physical Exam Vitals signs and nursing note reviewed.  Constitutional:      Appearance: Normal appearance.  Cardiovascular:     Rate and Rhythm: Normal rate and regular rhythm.     Heart sounds: Normal heart sounds.  Pulmonary:     Breath sounds: Normal breath sounds.  Musculoskeletal:     Right lower leg: Edema (2+) present.     Left lower leg: Edema (2+) present.  Neurological:     General: No focal deficit present.     Mental Status: She is alert and oriented to person, place, and time.  Psychiatric:        Mood and Affect: Mood normal.        Behavior: Behavior normal.    BP (!) 120/54   Pulse 75   Temp (!) 97 F (36.1 C) (Temporal)   Wt 105 lb (47.6 kg)   SpO2 97%   BMI 19.20 kg/m         Assessment & Plan:  [name] in today with chief complaint of Cellulitis   1. Cellulitis of right lower extremity  - apply/Change dressing - Apply unna boot  2. Cellulitis of left lower extremity Increase lasix to BID till seen again  Finish antibiotics Elevate while sitting - apply/Change dressing - Apply unna boot Follow up Monday Referral made to wound care  [place], FNP

## 2019-06-22 NOTE — Telephone Encounter (Signed)
LMOVM

## 2019-06-23 ENCOUNTER — Other Ambulatory Visit: Payer: Self-pay | Admitting: Nurse Practitioner

## 2019-06-23 DIAGNOSIS — K219 Gastro-esophageal reflux disease without esophagitis: Secondary | ICD-10-CM

## 2019-06-23 MED ORDER — PANTOPRAZOLE SODIUM 40 MG PO TBEC: 40.0000 mg | DELAYED_RELEASE_TABLET | Freq: Two times a day (BID) | ORAL | 1 refills | Status: AC

## 2019-06-23 NOTE — Telephone Encounter (Signed)
Sent in

## 2019-06-26 NOTE — Telephone Encounter (Signed)
-----   Message from Thompson Grayer, MD sent at 06/16/2019 10:20 AM EST ----- Her transmitter is at her house and she is staying at her daughters house.  Due for a remote.  I asked her to have her daughter go get her old transmitter and then send a transmission.  Please follow-up with her.

## 2019-06-26 NOTE — Telephone Encounter (Signed)
The pt states she will have her granddaughter pick up the monitor. I told her as soon as she get the monitor to call me on my direct office number to get help sending the transmission. The pt verbalized understanding.

## 2019-06-27 ENCOUNTER — Encounter: Payer: Self-pay | Admitting: Nurse Practitioner

## 2019-06-27 ENCOUNTER — Ambulatory Visit (INDEPENDENT_AMBULATORY_CARE_PROVIDER_SITE_OTHER): Payer: Medicare HMO | Admitting: Nurse Practitioner

## 2019-06-27 ENCOUNTER — Other Ambulatory Visit: Payer: Self-pay

## 2019-06-27 VITALS — BP 120/50 | HR 85 | Temp 97.7°F | Resp 20 | Ht 62.0 in | Wt 125.0 lb

## 2019-06-27 DIAGNOSIS — I4821 Permanent atrial fibrillation: Secondary | ICD-10-CM

## 2019-06-27 DIAGNOSIS — I878 Other specified disorders of veins: Secondary | ICD-10-CM | POA: Diagnosis not present

## 2019-06-27 DIAGNOSIS — Z23 Encounter for immunization: Secondary | ICD-10-CM

## 2019-06-27 LAB — COAGUCHEK XS/INR WAIVED
INR: 1.8 — ABNORMAL HIGH (ref 0.9–1.1)
Prothrombin Time: 22.1 s

## 2019-06-27 NOTE — Progress Notes (Signed)
Subjective:    Patient ID: [name], female    DOB: 1935/02/27, 84 y.o.   MRN: [id number]   Chief Complaint: Recheck cellulitis and protime   HPI patient comes in today for: - recheck of cellulitis- she has been battling this for a cople of weeks. Has been on cipro with and increase in lasix. She has had unna boots applied 2x. Her weight is up 20lbs according to note but I think this is an error in charting at last visit.  - Rechekc INR - Indication: atrial fibrillation Bleeding signs/symptoms: None Thromboembolic signs/symptoms: None  Missed Coumadin doses: None Medication changes: no Dietary changes: no Bacterial/viral infection: no Other concerns: no     Review of Systems  Constitutional: Negative for activity change and appetite change.  HENT: Negative.   Eyes: Negative for pain.  Respiratory: Negative for cough and shortness of breath.   Cardiovascular: Positive for leg swelling. Negative for chest pain and palpitations.  Gastrointestinal: Negative for abdominal pain.  Endocrine: Negative for polydipsia.  Genitourinary: Negative.   Skin: Negative for rash.  Neurological: Negative for dizziness, weakness and headaches.  Hematological: Does not bruise/bleed easily.  Psychiatric/Behavioral: Negative.   All other systems reviewed and are negative.      Objective:   Physical Exam Vitals signs and nursing note reviewed.  Constitutional:      General: She is not in acute distress.    Appearance: Normal appearance. She is well-developed.  HENT:     Head: Normocephalic.     Nose: Nose normal.  Eyes:     Pupils: Pupils are equal, round, and reactive to light.  Neck:     Musculoskeletal: Normal range of motion and neck supple.     Vascular: No carotid bruit or JVD.  Cardiovascular:     Rate and Rhythm: Normal rate and regular rhythm.     Heart sounds: Normal heart sounds.  Pulmonary:     Effort: Pulmonary effort is normal. No respiratory distress.    Breath sounds: Normal breath sounds. No wheezing or rales.  Chest:     Chest wall: No tenderness.  Abdominal:     General: Bowel sounds are normal. There is no distension or abdominal bruit.     Palpations: Abdomen is soft. There is no hepatomegaly, splenomegaly, mass or pulsatile mass.     Tenderness: There is no abdominal tenderness.  Musculoskeletal: Normal range of motion.     Right lower leg: Edema (1+) present.     Left lower leg: Edema (!+) present.  Lymphadenopathy:     Cervical: No cervical adenopathy.  Skin:    General: Skin is warm and dry.     Comments: No lesions noted on lower ext  Neurological:     Mental Status: She is alert and oriented to person, place, and time.     Deep Tendon Reflexes: Reflexes are normal and symmetric.  Psychiatric:        Behavior: Behavior normal.        Thought Content: Thought content normal.        Judgment: Judgment normal.     INR-1.8  BP (!) 120/50   Pulse 85   Temp 97.7 F (36.5 C) (Temporal)   Resp 20   Ht 5\' 2"  (1.575 m)   Wt 125 lb (56.7 kg)   SpO2 98%   BMI 22.86 kg/m       Assessment & Plan:  [name] in today with chief complaint of Recheck cellulitis and protime  1. Permanent atrial fibrillation (HCC) Coumadin 1mg  daily except 2mg  on tues and thur - inr FINGERSTICK  2. Venous stasis compression hose when get home Elevate legs when sitting Lasix 1 po in AM an s1/2 tablet round 1pm daily F/U prn   [name] Hassell Done, FNP

## 2019-06-27 NOTE — Patient Instructions (Signed)

## 2019-06-30 NOTE — Telephone Encounter (Signed)
According to [place] last communication date 06-28-2019.

## 2019-07-05 NOTE — Telephone Encounter (Signed)
Spoke with patient and home monitor has not been moved to her daughter's house . Encouraged patient to have monitor brought to her plaace of residence. She is unsure if it will work at her daughter's home. Will call back Monday to assist with set of monitor.

## 2019-07-12 ENCOUNTER — Ambulatory Visit: Payer: Self-pay | Admitting: Nurse Practitioner

## 2019-07-12 NOTE — Telephone Encounter (Signed)
LMOVM for pt to send a transmission with her home monitor. I left my direct office number for pt to call back.

## 2019-07-13 NOTE — Telephone Encounter (Signed)
I spoke with the pt and she states she is having a difficult time walking and would like for me to order her a new monitor. I ordered the pt a new home remote monitor.

## 2019-07-20 ENCOUNTER — Telehealth: Payer: Self-pay | Admitting: Nurse Practitioner

## 2019-07-20 NOTE — Telephone Encounter (Signed)
What symptoms do you have? Runny nose and hacking cough and no fever. Hadn't left the house  How long have you been sick? Started cough last night  Have you been seen for this problem? NO  If your provider decides to give you a prescription, which pharmacy would you like for [facility] to [facility]  Patient informed that this information will be sent to the clinical staff for review and that they should receive a follow up call.

## 2019-07-21 ENCOUNTER — Telehealth: Payer: Self-pay | Admitting: Nurse Practitioner

## 2019-07-21 NOTE — Telephone Encounter (Signed)
Insurance will not pay for cold meds. Should not need antibiotic- take OTC cold meds for people with high blood pressure.

## 2019-07-21 NOTE — Telephone Encounter (Signed)
Pt aware of provider feedback and voiced understanding. 

## 2019-07-25 ENCOUNTER — Encounter (HOSPITAL_COMMUNITY): Payer: Self-pay

## 2019-07-25 ENCOUNTER — Inpatient Hospital Stay (HOSPITAL_COMMUNITY)
Admission: EM | Admit: 2019-07-25 | Discharge: 2019-08-08 | DRG: 291 | Disposition: A | Discharge status: Home Health | Payer: Medicare HMO | Attending: Internal Medicine | Admitting: Internal Medicine

## 2019-07-25 ENCOUNTER — Other Ambulatory Visit: Payer: Self-pay

## 2019-07-25 ENCOUNTER — Ambulatory Visit (INDEPENDENT_AMBULATORY_CARE_PROVIDER_SITE_OTHER): Payer: Medicare HMO | Admitting: Nurse Practitioner

## 2019-07-25 ENCOUNTER — Encounter: Payer: Self-pay | Admitting: Nurse Practitioner

## 2019-07-25 ENCOUNTER — Emergency Department (HOSPITAL_COMMUNITY): Payer: Medicare HMO

## 2019-07-25 DIAGNOSIS — I2510 Atherosclerotic heart disease of native coronary artery without angina pectoris: Secondary | ICD-10-CM | POA: Diagnosis present

## 2019-07-25 DIAGNOSIS — J930 Spontaneous tension pneumothorax: Secondary | ICD-10-CM | POA: Diagnosis not present

## 2019-07-25 DIAGNOSIS — Z7951 Long term (current) use of inhaled steroids: Secondary | ICD-10-CM

## 2019-07-25 DIAGNOSIS — E785 Hyperlipidemia, unspecified: Secondary | ICD-10-CM | POA: Diagnosis present

## 2019-07-25 DIAGNOSIS — I509 Heart failure, unspecified: Secondary | ICD-10-CM | POA: Diagnosis not present

## 2019-07-25 DIAGNOSIS — J449 Chronic obstructive pulmonary disease, unspecified: Secondary | ICD-10-CM | POA: Diagnosis not present

## 2019-07-25 DIAGNOSIS — R059 Cough, unspecified: Secondary | ICD-10-CM

## 2019-07-25 DIAGNOSIS — E1121 Type 2 diabetes mellitus with diabetic nephropathy: Secondary | ICD-10-CM | POA: Diagnosis not present

## 2019-07-25 DIAGNOSIS — E1129 Type 2 diabetes mellitus with other diabetic kidney complication: Secondary | ICD-10-CM | POA: Diagnosis present

## 2019-07-25 DIAGNOSIS — Z8249 Family history of ischemic heart disease and other diseases of the circulatory system: Secondary | ICD-10-CM | POA: Diagnosis not present

## 2019-07-25 DIAGNOSIS — E1165 Type 2 diabetes mellitus with hyperglycemia: Secondary | ICD-10-CM | POA: Diagnosis present

## 2019-07-25 DIAGNOSIS — K6289 Other specified diseases of anus and rectum: Secondary | ICD-10-CM | POA: Diagnosis present

## 2019-07-25 DIAGNOSIS — I4821 Permanent atrial fibrillation: Secondary | ICD-10-CM | POA: Diagnosis present

## 2019-07-25 DIAGNOSIS — J9383 Other pneumothorax: Secondary | ICD-10-CM | POA: Diagnosis present

## 2019-07-25 DIAGNOSIS — J939 Pneumothorax, unspecified: Secondary | ICD-10-CM

## 2019-07-25 DIAGNOSIS — J440 Chronic obstructive pulmonary disease with acute lower respiratory infection: Secondary | ICD-10-CM | POA: Diagnosis present

## 2019-07-25 DIAGNOSIS — G8929 Other chronic pain: Secondary | ICD-10-CM | POA: Diagnosis present

## 2019-07-25 DIAGNOSIS — Z7901 Long term (current) use of anticoagulants: Secondary | ICD-10-CM | POA: Diagnosis not present

## 2019-07-25 DIAGNOSIS — I10 Essential (primary) hypertension: Secondary | ICD-10-CM | POA: Diagnosis present

## 2019-07-25 DIAGNOSIS — J9621 Acute and chronic respiratory failure with hypoxia: Secondary | ICD-10-CM | POA: Diagnosis present

## 2019-07-25 DIAGNOSIS — J441 Chronic obstructive pulmonary disease with (acute) exacerbation: Secondary | ICD-10-CM | POA: Diagnosis present

## 2019-07-25 DIAGNOSIS — K219 Gastro-esophageal reflux disease without esophagitis: Secondary | ICD-10-CM | POA: Diagnosis present

## 2019-07-25 DIAGNOSIS — E7800 Pure hypercholesterolemia, unspecified: Secondary | ICD-10-CM | POA: Diagnosis present

## 2019-07-25 DIAGNOSIS — R339 Retention of urine, unspecified: Secondary | ICD-10-CM | POA: Diagnosis present

## 2019-07-25 DIAGNOSIS — Z9981 Dependence on supplemental oxygen: Secondary | ICD-10-CM

## 2019-07-25 DIAGNOSIS — F17210 Nicotine dependence, cigarettes, uncomplicated: Secondary | ICD-10-CM | POA: Diagnosis present

## 2019-07-25 DIAGNOSIS — K861 Other chronic pancreatitis: Secondary | ICD-10-CM | POA: Diagnosis present

## 2019-07-25 DIAGNOSIS — I130 Hypertensive heart and chronic kidney disease with heart failure and stage 1 through stage 4 chronic kidney disease, or unspecified chronic kidney disease: Principal | ICD-10-CM | POA: Diagnosis present

## 2019-07-25 DIAGNOSIS — Z8619 Personal history of other infectious and parasitic diseases: Secondary | ICD-10-CM | POA: Diagnosis not present

## 2019-07-25 DIAGNOSIS — R05 Cough: Secondary | ICD-10-CM | POA: Diagnosis not present

## 2019-07-25 DIAGNOSIS — L8990 Pressure ulcer of unspecified site, unspecified stage: Secondary | ICD-10-CM | POA: Insufficient documentation

## 2019-07-25 DIAGNOSIS — J189 Pneumonia, unspecified organism: Secondary | ICD-10-CM | POA: Diagnosis present

## 2019-07-25 DIAGNOSIS — Z79899 Other long term (current) drug therapy: Secondary | ICD-10-CM

## 2019-07-25 DIAGNOSIS — R109 Unspecified abdominal pain: Secondary | ICD-10-CM

## 2019-07-25 DIAGNOSIS — R0602 Shortness of breath: Secondary | ICD-10-CM | POA: Diagnosis present

## 2019-07-25 DIAGNOSIS — I5033 Acute on chronic diastolic (congestive) heart failure: Secondary | ICD-10-CM | POA: Diagnosis present

## 2019-07-25 DIAGNOSIS — F419 Anxiety disorder, unspecified: Secondary | ICD-10-CM | POA: Diagnosis present

## 2019-07-25 DIAGNOSIS — N1832 Chronic kidney disease, stage 3b: Secondary | ICD-10-CM | POA: Diagnosis present

## 2019-07-25 DIAGNOSIS — Z950 Presence of cardiac pacemaker: Secondary | ICD-10-CM

## 2019-07-25 DIAGNOSIS — M545 Low back pain: Secondary | ICD-10-CM | POA: Diagnosis present

## 2019-07-25 DIAGNOSIS — N183 Chronic kidney disease, stage 3 unspecified: Secondary | ICD-10-CM | POA: Diagnosis not present

## 2019-07-25 DIAGNOSIS — Z7984 Long term (current) use of oral hypoglycemic drugs: Secondary | ICD-10-CM

## 2019-07-25 DIAGNOSIS — R338 Other retention of urine: Secondary | ICD-10-CM | POA: Diagnosis not present

## 2019-07-25 DIAGNOSIS — E1122 Type 2 diabetes mellitus with diabetic chronic kidney disease: Secondary | ICD-10-CM | POA: Diagnosis present

## 2019-07-25 DIAGNOSIS — D649 Anemia, unspecified: Secondary | ICD-10-CM | POA: Diagnosis present

## 2019-07-25 DIAGNOSIS — L89600 Pressure ulcer of unspecified heel, unstageable: Secondary | ICD-10-CM | POA: Diagnosis not present

## 2019-07-25 DIAGNOSIS — G4700 Insomnia, unspecified: Secondary | ICD-10-CM | POA: Diagnosis present

## 2019-07-25 DIAGNOSIS — T380X5A Adverse effect of glucocorticoids and synthetic analogues, initial encounter: Secondary | ICD-10-CM | POA: Diagnosis present

## 2019-07-25 DIAGNOSIS — R7981 Abnormal blood-gas level: Secondary | ICD-10-CM | POA: Diagnosis not present

## 2019-07-25 HISTORY — DX: COVID-19: U07.1

## 2019-07-25 LAB — CBC WITH DIFFERENTIAL/PLATELET
Abs Immature Granulocytes: 0.01 10*3/uL (ref 0.00–0.07)
Basophils Absolute: 0 10*3/uL (ref 0.0–0.1)
Basophils Relative: 1 %
Eosinophils Absolute: 0.1 10*3/uL (ref 0.0–0.5)
Eosinophils Relative: 1 %
HCT: 29.8 % — ABNORMAL LOW (ref 36.0–46.0)
Hemoglobin: 8.7 g/dL — ABNORMAL LOW (ref 12.0–15.0)
Immature Granulocytes: 0 %
Lymphocytes Relative: 15 %
Lymphs Abs: 1.2 10*3/uL (ref 0.7–4.0)
MCH: 32.7 pg (ref 26.0–34.0)
MCHC: 29.2 g/dL — ABNORMAL LOW (ref 30.0–36.0)
MCV: 112 fL — ABNORMAL HIGH (ref 80.0–100.0)
Monocytes Absolute: 0.5 10*3/uL (ref 0.1–1.0)
Monocytes Relative: 7 %
Neutro Abs: 5.7 10*3/uL (ref 1.7–7.7)
Neutrophils Relative %: 76 %
Platelets: 242 10*3/uL (ref 150–400)
RBC: 2.66 MIL/uL — ABNORMAL LOW (ref 3.87–5.11)
RDW: 14.6 % (ref 11.5–15.5)
WBC: 7.5 10*3/uL (ref 4.0–10.5)
nRBC: 0 % (ref 0.0–0.2)

## 2019-07-25 LAB — TROPONIN I (HIGH SENSITIVITY)
Troponin I (High Sensitivity): 20 ng/L — ABNORMAL HIGH (ref <18)
Troponin I (High Sensitivity): 18 ng/L — ABNORMAL HIGH (ref <18)

## 2019-07-25 LAB — COMPREHENSIVE METABOLIC PANEL
ALT: 14 U/L (ref 0–44)
AST: 24 U/L (ref 15–41)
Albumin: 3.2 g/dL — ABNORMAL LOW (ref 3.5–5.0)
Alkaline Phosphatase: 90 U/L (ref 38–126)
Anion gap: 13 (ref 5–15)
BUN: 34 mg/dL — ABNORMAL HIGH (ref 8–23)
CO2: 25 mmol/L (ref 22–32)
Calcium: 9.6 mg/dL (ref 8.9–10.3)
Chloride: 104 mmol/L (ref 98–111)
Creatinine, Ser: 1.61 mg/dL — ABNORMAL HIGH (ref 0.44–1.00)
GFR calc Af Amer: 34 mL/min — ABNORMAL LOW (ref >60)
GFR calc non Af Amer: 29 mL/min — ABNORMAL LOW (ref >60)
Glucose, Bld: 137 mg/dL — ABNORMAL HIGH (ref 70–99)
Potassium: 3.7 mmol/L (ref 3.5–5.1)
Sodium: 142 mmol/L (ref 135–145)
Total Bilirubin: 0.5 mg/dL (ref 0.3–1.2)
Total Protein: 7.1 g/dL (ref 6.5–8.1)

## 2019-07-25 LAB — CBG MONITORING, ED: Glucose-Capillary: 153 mg/dL — ABNORMAL HIGH (ref 70–99)

## 2019-07-25 LAB — POC SARS CORONAVIRUS 2 AG -  ED: SARS Coronavirus 2 Ag: NEGATIVE

## 2019-07-25 LAB — PROTIME-INR
INR: 2 — ABNORMAL HIGH (ref 0.8–1.2)
Prothrombin Time: 22.1 seconds — ABNORMAL HIGH (ref 11.4–15.2)

## 2019-07-25 LAB — BRAIN NATRIURETIC PEPTIDE: B Natriuretic Peptide: 790 pg/mL — ABNORMAL HIGH (ref 0.0–100.0)

## 2019-07-25 MED ORDER — ALBUTEROL SULFATE HFA 108 (90 BASE) MCG/ACT IN AERS
4.0000 | INHALATION_SPRAY | Freq: Once | RESPIRATORY_TRACT | Status: AC
  Administered 2019-07-25: 4 via RESPIRATORY_TRACT
  Filled 2019-07-25: qty 6.7

## 2019-07-25 MED ORDER — PANCRELIPASE (LIP-PROT-AMYL) 36000-114000 UNITS PO CPEP
36000.0000 [IU] | ORAL_CAPSULE | Freq: Three times a day (TID) | ORAL | Status: DC
  Administered 2019-07-26 – 2019-08-08 (×40): 36000 [IU] via ORAL
  Filled 2019-07-25 (×48): qty 1

## 2019-07-25 MED ORDER — HYDROCODONE-ACETAMINOPHEN 5-325 MG PO TABS
1.5000 | ORAL_TABLET | Freq: Three times a day (TID) | ORAL | Status: DC | PRN
  Administered 2019-07-25 – 2019-08-04 (×25): 1.5 via ORAL
  Filled 2019-07-25 (×28): qty 2

## 2019-07-25 MED ORDER — ALBUTEROL SULFATE HFA 108 (90 BASE) MCG/ACT IN AERS
2.0000 | INHALATION_SPRAY | Freq: Four times a day (QID) | RESPIRATORY_TRACT | Status: DC | PRN
  Administered 2019-07-25 – 2019-08-03 (×2): 2 via RESPIRATORY_TRACT
  Filled 2019-07-25: qty 6.7

## 2019-07-25 MED ORDER — METHYLPREDNISOLONE SODIUM SUCC 125 MG IJ SOLR
80.0000 mg | Freq: Three times a day (TID) | INTRAMUSCULAR | Status: DC
  Administered 2019-07-26 – 2019-07-28 (×7): 80 mg via INTRAVENOUS
  Filled 2019-07-25 (×7): qty 2

## 2019-07-25 MED ORDER — POTASSIUM CHLORIDE CRYS ER 20 MEQ PO TBCR
20.0000 meq | EXTENDED_RELEASE_TABLET | Freq: Every day | ORAL | Status: DC
  Administered 2019-07-25 – 2019-08-08 (×15): 20 meq via ORAL
  Filled 2019-07-25 (×16): qty 1

## 2019-07-25 MED ORDER — OCUVITE-LUTEIN PO CAPS
1.0000 | ORAL_CAPSULE | Freq: Every day | ORAL | Status: DC
  Administered 2019-07-26 – 2019-08-08 (×14): 1 via ORAL
  Filled 2019-07-25 (×17): qty 1

## 2019-07-25 MED ORDER — AEROCHAMBER Z-STAT PLUS/MEDIUM MISC
Status: AC
  Administered 2019-07-25: 1
  Filled 2019-07-25: qty 1

## 2019-07-25 MED ORDER — WARFARIN SODIUM 1 MG PO TABS
1.0000 mg | ORAL_TABLET | Freq: Every day | ORAL | Status: DC
  Administered 2019-07-26 – 2019-08-03 (×9): 1 mg via ORAL
  Filled 2019-07-25 (×11): qty 1

## 2019-07-25 MED ORDER — INSULIN ASPART 100 UNIT/ML ~~LOC~~ SOLN: 0.0000 [IU] | Freq: Every day | SUBCUTANEOUS | Status: DC

## 2019-07-25 MED ORDER — LINAGLIPTIN 5 MG PO TABS: 5.0000 mg | ORAL_TABLET | Freq: Every day | ORAL | Status: DC

## 2019-07-25 MED ORDER — MOMETASONE FURO-FORMOTEROL FUM 200-5 MCG/ACT IN AERO
2.0000 | INHALATION_SPRAY | Freq: Two times a day (BID) | RESPIRATORY_TRACT | Status: DC
  Filled 2019-07-25: qty 8.8

## 2019-07-25 MED ORDER — SIMVASTATIN 20 MG PO TABS
10.0000 mg | ORAL_TABLET | Freq: Every day | ORAL | Status: DC
  Administered 2019-07-25 – 2019-08-08 (×15): 10 mg via ORAL
  Filled 2019-07-25 (×15): qty 1

## 2019-07-25 MED ORDER — FUROSEMIDE 10 MG/ML IJ SOLN
40.0000 mg | Freq: Once | INTRAMUSCULAR | Status: AC
  Administered 2019-07-25: 40 mg via INTRAVENOUS
  Filled 2019-07-25: qty 4

## 2019-07-25 MED ORDER — FERROUS SULFATE 325 (65 FE) MG PO TABS
325.0000 mg | ORAL_TABLET | Freq: Every day | ORAL | Status: DC
  Administered 2019-07-26 – 2019-08-08 (×14): 325 mg via ORAL
  Filled 2019-07-25 (×15): qty 1

## 2019-07-25 MED ORDER — SODIUM CHLORIDE 0.9 % IV SOLN
2.0000 g | INTRAVENOUS | Status: AC
  Administered 2019-07-25 – 2019-07-29 (×5): 2 g via INTRAVENOUS
  Filled 2019-07-25 (×5): qty 20

## 2019-07-25 MED ORDER — WARFARIN - PHARMACIST DOSING INPATIENT: Freq: Every day | Status: DC

## 2019-07-25 MED ORDER — SODIUM CHLORIDE 0.9 % IV SOLN
500.0000 mg | INTRAVENOUS | Status: AC
  Administered 2019-07-25 – 2019-07-29 (×5): 500 mg via INTRAVENOUS
  Filled 2019-07-25 (×5): qty 500

## 2019-07-25 MED ORDER — INSULIN ASPART 100 UNIT/ML ~~LOC~~ SOLN
0.0000 [IU] | Freq: Three times a day (TID) | SUBCUTANEOUS | Status: DC
  Administered 2019-07-26: 3 [IU] via SUBCUTANEOUS
  Administered 2019-07-26 (×2): 2 [IU] via SUBCUTANEOUS
  Administered 2019-07-27: 3 [IU] via SUBCUTANEOUS
  Administered 2019-07-27 (×2): 2 [IU] via SUBCUTANEOUS
  Administered 2019-07-28: 9 [IU] via SUBCUTANEOUS

## 2019-07-25 MED ORDER — PANTOPRAZOLE SODIUM 40 MG PO TBEC
40.0000 mg | DELAYED_RELEASE_TABLET | Freq: Two times a day (BID) | ORAL | Status: DC
  Administered 2019-07-25 – 2019-08-08 (×28): 40 mg via ORAL
  Filled 2019-07-25 (×28): qty 1

## 2019-07-25 MED ORDER — DILTIAZEM HCL 60 MG PO TABS
120.0000 mg | ORAL_TABLET | Freq: Every day | ORAL | Status: DC
  Administered 2019-07-25 – 2019-08-08 (×15): 120 mg via ORAL
  Filled 2019-07-25 (×5): qty 2
  Filled 2019-07-25: qty 4
  Filled 2019-07-25 (×9): qty 2

## 2019-07-25 MED ORDER — CLONAZEPAM 0.5 MG PO TABS
0.5000 mg | ORAL_TABLET | Freq: Three times a day (TID) | ORAL | Status: DC | PRN
  Administered 2019-07-26 – 2019-08-07 (×19): 0.5 mg via ORAL
  Filled 2019-07-25 (×19): qty 1

## 2019-07-25 MED ORDER — PRO-STAT SUGAR FREE PO LIQD
30.0000 mL | Freq: Two times a day (BID) | ORAL | Status: DC
  Administered 2019-07-26 – 2019-08-08 (×25): 30 mL via ORAL
  Filled 2019-07-25 (×31): qty 30

## 2019-07-25 MED ORDER — METHYLPREDNISOLONE SODIUM SUCC 125 MG IJ SOLR
125.0000 mg | Freq: Once | INTRAMUSCULAR | Status: AC
  Administered 2019-07-25: 125 mg via INTRAVENOUS
  Filled 2019-07-25: qty 2

## 2019-07-25 MED ORDER — UMECLIDINIUM BROMIDE 62.5 MCG/INH IN AEPB
1.0000 | INHALATION_SPRAY | Freq: Every day | RESPIRATORY_TRACT | Status: DC
  Filled 2019-07-25: qty 7

## 2019-07-25 MED ORDER — FUROSEMIDE 10 MG/ML IJ SOLN
40.0000 mg | Freq: Every day | INTRAMUSCULAR | Status: DC
  Administered 2019-07-26 – 2019-07-27 (×2): 40 mg via INTRAVENOUS
  Filled 2019-07-25 (×2): qty 4

## 2019-07-25 MED ORDER — SUCRALFATE 1 GM/10ML PO SUSP
1.0000 g | Freq: Three times a day (TID) | ORAL | Status: DC
  Administered 2019-07-25 – 2019-08-08 (×54): 1 g via ORAL
  Filled 2019-07-25 (×54): qty 10

## 2019-07-25 NOTE — ED Provider Notes (Signed)
[facility] EMERGENCY DEPARTMENT Provider Note   CSN: [id number] Arrival date & time: 07/25/19  1520     History Chief Complaint  Patient presents with  . Shortness of Breath    [name] is a 84 y.o. female.  Patient is an 84 year old female with past medical history of coronary artery disease, congestive heart failure, chronic renal insufficiency, diabetes, COPD, hypertension, and hospitalization for COVID-19 during early October.  Patient presents today for evaluation of shortness of breath worsening over the past 2 days.  Patient normally wears 2 L oxygen by nasal cannula, however is needing 4 L nasal cannula to maintain saturations above the 80s.  She describes some cough, but no fever.  She reports increased swelling of her legs.  The history is provided by the patient.  Shortness of Breath Severity:  Moderate      Past Medical History:  Diagnosis Date  . Abnormal CT of the chest    Mediastinal and hilar adenopathy followed by Dr. Koleen Nimrod in [place]  . Anemia, unspecified   . Body mass index (BMI) of 20.0-20.9 in adult FEB 2013 147 LBS  . CAD (coronary artery disease) 2011   Catheterization August 2011: mild nonobstructive coronary artery disease complicated by large left rectus sheath hematoma status post evacuation Coumadin restarted without recurrence  . Carotid artery disease ([place])    Doppler, 05/2012, 0-39% bilateral  . Cataract   . Chronic airway obstruction, not elsewhere classified   . Chronic kidney disease, stage II (mild)   . Congestive heart failure, unspecified    EF now 60%, previous nonischemic cardiomyopathy  . COVID-19   . Diabetes mellitus   . Dilated bile duct 10/19/2011  . Diverticulitis Dec 2012  . Gastritis    By EGD  . GERD (gastroesophageal reflux disease)   . Hypercholesterolemia   . Hypertension   . Mitral regurgitation 06/27/2013  . Neurogenic sleep apnea    Uses noctural oxygen prescribed by her pulmonologi  . Pacemaker    Single chamber [employer] Jude ACCENT [phone] SN 719 04/11/1957 10/31/2010  . Permanent atrial fibrillation (HCC)    H/o tachybradycardia syndrome on Coumadin anticoagulation, has pacemaker.  . Recurrent UTI   . Rheumatic heart disease   . Tachycardia-bradycardia [facility])    s/p St. Jude single chamber PPM 10/2010   . Unspecified hypertensive kidney disease with chronic kidney disease stage I through stage IV, or unspecified(403.90)   . Valvular heart disease    a. H/o moderate mitral stenosis by cath 2011 but no mention of this on 10/2011 echo. b. 10/2011 echo: mod MR, mild AS, mild TR; EF>65%  . Vitamin D deficiency     Patient Active Problem List   Diagnosis Date Noted  . Pneumonia due to COVID-19 virus 05/10/2019  . Elevated troponin   . Pneumonia 05/09/2019  . Leg wound, left, sequela 05/09/2019  . Chronic respiratory failure with hypoxia ([place]) 05/09/2019  . COPD exacerbation ([place]) 04/26/2019  . Hypokalemia 04/26/2019  . Supratherapeutic INR 04/26/2019  . At high risk for falls 02/13/2016  . CKD (chronic kidney disease), stage III ([place]) 07/02/2015  . Hyperlipidemia with target LDL less than 100 12/31/2014  . Osteoporosis, post-menopausal 11/01/2013  . GERD (gastroesophageal reflux disease) 07/13/2013  . Aortic stenosis 06/27/2013  . Acute on chronic diastolic CHF (congestive heart failure) ([place]) 06/27/2013  . Vitamin D deficiency   . Tobacco abuse 05/12/2012  . Abdominal wall hernia 10/19/2011  . Unspecified atrial fibrillation ([place])   . CAD (coronary artery  disease)   . Pacemaker-St.Jude   . Chronic anticoagulation 12/03/2010  . Anxiety 12/03/2010  . Mitral stenosis 05/30/2010  . Macrocytic anemia 05/16/2010  . Type 2 diabetes mellitus with kidney complication, without long-term current use of insulin ([place]) 01/19/2009  . Essential hypertension 01/19/2009  . COPD GOLD O  01/19/2009  . HEART MURMUR, BENIGN 01/19/2009    Past Surgical History:  Procedure Laterality Date  .  CHOLECYSTECTOMY     40+ years ago  . COLONOSCOPY  10/2011   Dyann Ruddle sigmoid to descending diverticulosis, internal hemorrhoids  . ESOPHAGOGASTRODUODENOSCOPY  10/2011   Fields-erosive gastritis, biopsy with no H. pylori, hiatal hernia, peptic duodenitis, no celiac disease, duodenal diverticulum, duodenal nodule  . EUS  11/16/11   Mishra-13 MM CBD, normal pancreatic duct, otherwise normal EUS  . Evacuation of retroperitoneal and left rectus sheath    . HEMATOMA EVACUATION     femoral  . HERNIA REPAIR     X3  . PILONIDAL CYST / SINUS EXCISION    . Single-chamber pacemaker implantation  3/12   by Dr Caryl Comes     OB History    Gravida  2   Para  2   Term  2   Preterm      AB      Living        SAB      TAB      Ectopic      Multiple      Live Births              Family History  Problem Relation Age of Onset  . Cancer Mother        cervical  . Stroke Mother   . Cancer Daughter 88       kidney, lung  . Heart disease Father   . Heart attack Father   . Ulcers Father   . Early death Sister 1       died at 24 months old  . Hypertension Brother   . Cancer Paternal Aunt        breast  . Hypertension Son   . Colon cancer Neg Hx     Social History   Tobacco Use  . Smoking status: Former Smoker    Packs/day: 0.25    Years: 45.00    Pack years: 11.25    Types: Cigarettes    Start date: 02/05/1958    Quit date: 04/30/2019    Years since quitting: 0.2  . Smokeless tobacco: Never Used  . Tobacco comment: smokes 5-6 cigarettes daily  Substance Use Topics  . Alcohol use: No    Alcohol/week: 0.0 standard drinks  . Drug use: No    Home Medications Prior to Admission medications   Medication Sig Start Date End Date Taking? Authorizing Provider  budesonide-formoterol (SYMBICORT) 160-4.5 MCG/ACT inhaler Inhale 2 puffs into the lungs 2 (two) times daily. 01/13/19   Hassell Done, Mary-Margaret, FNP  ciprofloxacin (CIPRO) 500 MG tablet Take 1 tablet (500 mg total) by  mouth 2 (two) times daily. 06/19/19   Hassell Done, Mary-Margaret, FNP  clonazePAM (KLONOPIN) 0.5 MG tablet TAKE 1 TABLET 3 TIMES DAILY AS NEEDED FOR ANXIETY Patient taking differently: Take 0.5 mg by mouth 3 (three) times daily as needed for anxiety.  05/09/19   [name], FNP  CREON 36000 units CPEP capsule TAKE 3 CAPSULES WITH MEALS  AND TAKE 1 CAPSULE WITH A SNACK AS DIRECTED 06/02/19   Hassell Done, Mary-Margaret, FNP  diltiazem (CARDIZEM) 120 MG tablet  Take 1 tablet (120 mg total) by mouth daily. 05/09/19   [name], FNP  ferrous sulfate 325 (65 FE) MG tablet Take 1 tablet (325 mg total) by mouth daily with breakfast. 10/29/15   Cherre Robins, PharmD  furosemide (LASIX) 40 MG tablet Take 1 tablet (40 mg total) by mouth daily. 06/19/19   Hassell Done, Mary-Margaret, FNP  HYDROcodone-acetaminophen (NORCO) 7.5-325 MG tablet Take 1 tablet by mouth 3 (three) times daily as needed (pain).  02/17/16   [provider]  ipratropium-albuterol (DUONEB) 0.5-2.5 (3) MG/3ML SOLN INHALE THE CONTENTS OF 1 VIAL VIA NEBULIZER EVERY 4 HOURS AS NEEDED Patient taking differently: Take 3 mLs by nebulization every 4 (four) hours as needed (for shortness of breath/wheezing).  04/27/19   Kathie Dike, MD  lipase/protease/amylase (CREON) 12000 units CPEP capsule Take 36,000 Units by mouth 3 (three) times daily with meals. Take 3 capsules with meals and 1 capsule with snacks    [provider]  Multiple Vitamins-Minerals (PRESERVISION AREDS 2) CAPS Take 1 capsule by mouth daily.     [provider]  nitroGLYCERIN (NITROSTAT) 0.4 MG SL tablet DISSOLVE 1 TABLET UNDER THE TONGUE EVERY 5 MINUTES AS NEEDED FOR CHEST PAIN AS DIRECTED Patient taking differently: Place 0.4 mg under the tongue every 5 (five) minutes as needed for chest pain.  04/28/19   Herminio Commons, MD  omeprazole (PRILOSEC) 40 MG capsule TAKE 1 CAPSULE (40 MG TOTAL) BY MOUTH DAILY. 06/02/19   Hassell Done, Mary-Margaret, FNP    OXYGEN Inhale 2 L into the lungs at bedtime. 2lpm with sleep only     [provider]  pantoprazole (PROTONIX) 40 MG tablet Take 1 tablet (40 mg total) by mouth 2 (two) times daily. 06/23/19   Hassell Done, Mary-Margaret, FNP  potassium chloride SA (KLOR-CON) 20 MEQ tablet  06/18/19   [provider]  simvastatin (ZOCOR) 10 MG tablet Take 1 tablet (10 mg total) by mouth daily. 05/09/19   Hassell Done, Mary-Margaret, FNP  sitaGLIPtin (JANUVIA) 50 MG tablet TAKE 1 TABLET (50 MG TOTAL) BY MOUTH DAILY. Patient taking differently: Take 50 mg by mouth daily.  05/09/19   Hassell Done, Mary-Margaret, FNP  sucralfate (CARAFATE) 1 GM/10ML suspension Take 10 mLs (1 g total) by mouth 4 (four) times daily -  with meals and at bedtime. 05/22/19   Hawks, Christy A, FNP  VENTOLIN HFA 108 (90 Base) MCG/ACT inhaler USE 2 PUFFS EVERY 6 HOURS AS NEEDED Patient taking differently: Inhale 2 puffs into the lungs every 6 (six) hours as needed for wheezing or shortness of breath.  01/18/17   [name], FNP  warfarin (COUMADIN) 2 MG tablet Take 0.5 tablets (1 mg total) by mouth every evening. TAKE 1/2  TABLET (1 MG TOTAL) DAILY AT 6 PM.Get INR check by PCP the day after you are discharged 05/18/19   Thurnell Lose, MD    Allergies    Macrobid [nitrofurantoin monohyd macro], Torsemide, and Tramadol  Review of Systems   Review of Systems  Respiratory: Positive for shortness of breath.   All other systems reviewed and are negative.   Physical Exam Updated Vital Signs BP (!) 111/58   Pulse 86   Temp 98.5 F (36.9 C)   Resp 16   Ht 5\' 2"  (1.575 m)   Wt 46.3 kg   SpO2 97%   BMI 18.66 kg/m   Physical Exam Vitals and nursing note reviewed.  Constitutional:      General: She is not in acute distress.  Appearance: She is well-developed. She is not diaphoretic.  HENT:     Head: Normocephalic and atraumatic.  Cardiovascular:     Rate and Rhythm: Normal rate and regular rhythm.     Heart sounds: No  murmur. No friction rub. No gallop.   Pulmonary:     Effort: Pulmonary effort is normal. No respiratory distress.     Breath sounds: Normal breath sounds. No wheezing.  Abdominal:     General: Bowel sounds are normal. There is no distension.     Palpations: Abdomen is soft.     Tenderness: There is no abdominal tenderness.  Musculoskeletal:        General: Normal range of motion.     Cervical back: Normal range of motion and neck supple.  Skin:    General: Skin is warm and dry.  Neurological:     Mental Status: She is alert and oriented to person, place, and time.     ED Results / Procedures / Treatments   Labs (all labs ordered are listed, but only abnormal results are displayed) Labs Reviewed  COMPREHENSIVE METABOLIC PANEL - Abnormal; Notable for the following components:      Result Value   Glucose, Bld 137 (*)    BUN 34 (*)    Creatinine, Ser 1.61 (*)    Albumin 3.2 (*)    GFR calc non Af Amer 29 (*)    GFR calc Af Amer 34 (*)    All other components within normal limits  CBC WITH DIFFERENTIAL/PLATELET - Abnormal; Notable for the following components:   RBC 2.66 (*)    Hemoglobin 8.7 (*)    HCT 29.8 (*)    MCV 112.0 (*)    MCHC 29.2 (*)    All other components within normal limits  BRAIN NATRIURETIC PEPTIDE - Abnormal; Notable for the following components:   B Natriuretic Peptide 790.0 (*)    All other components within normal limits  TROPONIN I (HIGH SENSITIVITY) - Abnormal; Notable for the following components:   Troponin I (High Sensitivity) 20 (*)    All other components within normal limits  POC SARS CORONAVIRUS 2 AG -  ED  TROPONIN I (HIGH SENSITIVITY)    EKG EKG Interpretation  Date/Time:  Tuesday July 25 2019 15:40:28 EST Ventricular Rate:  92 PR Interval:    QRS Duration: 119 QT Interval:  377 QTC Calculation: 467 R Axis:   -77 Text Interpretation: //Atrial fibrillation Left anterior fascicular block Anterior infarct, age indeterminate No  significant change since 05/09/2019 Confirmed by Veryl Speak [phone]) on 07/25/2019 3:53:33 PM   Radiology DG Chest Port 1 View  Result Date: 07/25/2019 CLINICAL DATA:  Shortness of breath, history of CHF and COPD on home O2 EXAM: PORTABLE CHEST 1 VIEW COMPARISON:  Most recent radiograph 05/10/2019 FINDINGS: Lung volumes are diminished with patchy mixed interstitial and airspace opacity with a basilar predominance. Small right pleural effusion is seen. Cardiac silhouette appears enlarged though is partially obscured by basilar opacity. The aorta is calcified. No acute osseous or soft tissue abnormality. Degenerative changes are present in the imaged spine and shoulders. Stable sclerotic lesion in the right proximal humerus. IMPRESSION: 1. Low lung volumes with patchy mixed interstitial and airspace opacity with a basilar predominance. Findings are suspicious for multifocal pneumonia or aspiration though mixed interstitial and alveolar edema could have a similar appearance 2. Small right pleural effusion. 3. Stable cardiomegaly. 4.  Aortic Atherosclerosis (ICD10-I70.0). Electronically Signed   By: Elwin Sleight.D.  On: 07/25/2019 16:35    Procedures Procedures (including critical care time)  Medications Ordered in ED Medications  methylPREDNISolone sodium succinate (SOLU-MEDROL) 125 mg/2 mL injection 125 mg (125 mg Intravenous Given 07/25/19 1609)  albuterol (VENTOLIN HFA) 108 (90 Base) MCG/ACT inhaler 4 puff (4 puffs Inhalation Given 07/25/19 1609)  aerochamber Z-Stat Plus/medium (1 each  Given 07/25/19 1609)    ED Course  I have reviewed the triage vital signs and the nursing notes.  Pertinent labs & imaging results that were available during my care of the patient were reviewed by me and considered in my medical decision making (see chart for details).  Patient presenting here with complaints of shortness of breath, the etiology of which appears to be multifactorial.  I suspect both COPD  and likely congestive heart failure.  Her BNP is elevated and chest x-ray looks like failure.  Patient was also given albuterol and steroids along with Lasix.  Laboratory studies are otherwise unremarkable.  Patient has hypoxic upon presentation and I feel will require admission.  I have spoken with the hospitalist who agrees to admit.  CRITICAL CARE Performed by: Veryl Speak Total critical care time: 35 minutes Critical care time was exclusive of separately billable procedures and treating other patients. Critical care was necessary to treat or prevent imminent or life-threatening deterioration. Critical care was time spent personally by me on the following activities: development of treatment plan with patient and/or surrogate as well as nursing, discussions with consultants, evaluation of patient's response to treatment, examination of patient, obtaining history from patient or surrogate, ordering and performing treatments and interventions, ordering and review of laboratory studies, ordering and review of radiographic studies, pulse oximetry and re-evaluation of patient's condition.   MDM Rules/Calculators/A&P  Final Clinical Impression(s) / ED Diagnoses Final diagnoses:  None    Rx / DC Orders ED Discharge Orders    None       Veryl Speak, MD 07/25/19 2313

## 2019-07-25 NOTE — Progress Notes (Signed)
ANTICOAGULATION CONSULT NOTE - Initial Up Consult   Pharmacy Consult for warfarin dosing  Indication: atrial fibrillation   Allergies  Allergen Reactions  . Macrobid [Nitrofurantoin Monohyd Macro] Rash  . Torsemide Nausea And Vomiting  . Tramadol Nausea Only      Patient Measurements: Last Weight  Most recent update: 07/25/2019  3:27 PM   Weight  46.3 kg (102 lb)           Body mass index is 18.66 kg/m. [name]               Temp: 98.5 F (36.9 C) (12/15 1607) BP: 122/66 (12/15 1930) Pulse Rate: 107 (12/15 1830)  Labs: Recent Labs    07/25/19 1555  HGB 8.7*  HCT 29.8*  PLT 242  LABPROT 22.1*  INR 2.0*  CREATININE 1.61*    Estimated Creatinine Clearance: 19.7 mL/min (A) (by C-G formula based on SCr of 1.61 mg/dL (H)).  Goal of Therapy:  INR 2-3 Monitor CBC     Prior to Admission Warfarin Dosing:  [name] takes 1mg  of warfarin daily    Admit INR was 2.0 Lab Results  Component Value Date   INR 2.0 (H) 07/25/2019   INR 1.8 (H) 06/27/2019   INR 4.7 (A) 05/24/2019    Assessment: [name] a 84 y.o. female requires anticoagulation with warfarin for the indication of  Afib. Potential for drug drug interaction with azithromycin, will monitor closely. Warfarin will be initiated inpatient following pharmacy protocol per pharmacy consult. Patient most recent blood work is as follows:  CBC Latest Ref Rng & Units 07/25/2019 05/23/2019 05/14/2019  WBC 4.0 - 10.5 K/uL 7.5 9.6 7.1  Hemoglobin 12.0 - 15.0 g/dL 8.7(L) 9.0(L) 10.2(L)  Hematocrit 36.0 - 46.0 % 29.8(L) 28.1(L) 31.8(L)  Platelets 150 - 400 K/uL 242 296 164     Plan: Warfarin 1mg  po daily  Monitor CBC qmwf with am labs   Monitor INR daily Monitor for signs and symptoms of bleeding   [name], PharmD, MBA, BCGP Clinical Pharmacist

## 2019-07-25 NOTE — ED Notes (Signed)
Pt placed on high flow nasal cannula due to o2 trending downward on 4lpm. Pt is on 6lpm and is 93%

## 2019-07-25 NOTE — H&P (Signed)
TRH H&P    Patient Demographics:    [name], is a 84 y.o. female  MRN: [id number]  DOB - Oct 16, 1934  Admit Date - 07/25/2019  Referring MD/NP/PA: Trish Mage  Outpatient Primary MD for the patient is [name], [place]  Patient coming from:  home  Chief complaint- dyspnea   HPI:    [name]  is a 84 y.o. female,  w hypertension, hyperlipidemia, Dm2, CKD stage 3, Anemia, h/o pacer, Pafib, CAD, mild- mod Aortic regurgitation, mild Mitral regurgitation, CHF (EF 55-60%) , h/o  Covid-19 infection, Copd, c/o increase in dyspnea for the past 5 days,  Pt has had cough w grey sputum, pt denies fever, chills, cp, palp, n/v, abd pain, diarrhea, brbpr, black stool,   In ED,  T 98.5, P 64, R 16, Bp 119/66 pox 95%on 4L [place] WT 46.3  CXR IMPRESSION: 1. Low lung volumes with patchy mixed interstitial and airspace opacity with a basilar predominance. Findings are suspicious for multifocal pneumonia or aspiration though mixed interstitial and alveolar edema could have a similar appearance 2. Small right pleural effusion. 3. Stable cardiomegaly.  Na 142, K 3.7, Bun 34, creatinine 1.61 ASt 24, Alt 14 Wbc 7.5, Hgb 8.7, Plt 242 Trop 20 -> 18 BNP 790   Sars negative  Pt will be admitted for Copd exacerbation, Pneumonia.     Review of systems:    In addition to the HPI above,  No Fever-chills, No Headache, No changes with Vision or hearing, No problems swallowing food or Liquids, No Chest pain, Cough or Shortness of Breath, No Abdominal pain, No Nausea or Vomiting, bowel movements are regular, No Blood in stool, urine No dysuria, No new skin rashes or bruises, No new joints pains-aches,  No new weakness, tingling, numbness in any extremity, No recent weight gain or loss, No polyuria, polydypsia or polyphagia, No significant Mental Stressors.  All other systems reviewed and are  negative.    Past History of the following :    Past Medical History:  Diagnosis Date  . Abnormal CT of the chest    Mediastinal and hilar adenopathy followed by Dr. Koleen Nimrod in [place]  . Anemia, unspecified   . Body mass index (BMI) of 20.0-20.9 in adult FEB 2013 147 LBS  . CAD (coronary artery disease) 2011   Catheterization August 2011: mild nonobstructive coronary artery disease complicated by large left rectus sheath hematoma status post evacuation Coumadin restarted without recurrence  . Carotid artery disease ([place])    Doppler, 05/2012, 0-39% bilateral  . Cataract   . Chronic airway obstruction, not elsewhere classified   . Chronic kidney disease, stage II (mild)   . Congestive heart failure, unspecified    EF now 60%, previous nonischemic cardiomyopathy  . COVID-19   . Diabetes mellitus   . Dilated bile duct 10/19/2011  . Diverticulitis Dec 2012  . Gastritis    By EGD  . GERD (gastroesophageal reflux disease)   . Hypercholesterolemia   . Hypertension   . Mitral regurgitation 06/27/2013  . Neurogenic sleep apnea  Uses noctural oxygen prescribed by her pulmonologi  . Pacemaker    Single chamber [place]. Jude ACCENT [phone] SN 719 04/11/1959 10/31/2010  . Permanent atrial fibrillation (HCC)    H/o tachybradycardia syndrome on Coumadin anticoagulation, has pacemaker.  . Recurrent UTI   . Rheumatic heart disease   . Tachycardia-bradycardia [facility])    s/p St. Jude single chamber PPM 10/2010   . Unspecified hypertensive kidney disease with chronic kidney disease stage I through stage IV, or unspecified(403.90)   . Valvular heart disease    a. H/o moderate mitral stenosis by cath 2011 but no mention of this on 10/2011 echo. b. 10/2011 echo: mod MR, mild AS, mild TR; EF>65%  . Vitamin D deficiency       Past Surgical History:  Procedure Laterality Date  . CHOLECYSTECTOMY     40+ years ago  . COLONOSCOPY  10/2011   Dyann Ruddle sigmoid to descending diverticulosis, internal  hemorrhoids  . ESOPHAGOGASTRODUODENOSCOPY  10/2011   Fields-erosive gastritis, biopsy with no H. pylori, hiatal hernia, peptic duodenitis, no celiac disease, duodenal diverticulum, duodenal nodule  . EUS  11/16/11   Mishra-13 MM CBD, normal pancreatic duct, otherwise normal EUS  . Evacuation of retroperitoneal and left rectus sheath    . HEMATOMA EVACUATION     femoral  . HERNIA REPAIR     X3  . PILONIDAL CYST / SINUS EXCISION    . Single-chamber pacemaker implantation  3/12   by Dr Caryl Comes      Social History:      Social History   Tobacco Use  . Smoking status: Former Smoker    Packs/day: 0.25    Years: 45.00    Pack years: 11.25    Types: Cigarettes    Start date: 02/05/1958    Quit date: 04/30/2019    Years since quitting: 0.2  . Smokeless tobacco: Never Used  . Tobacco comment: smokes 5-6 cigarettes daily  Substance Use Topics  . Alcohol use: No    Alcohol/week: 0.0 standard drinks       Family History :     Family History  Problem Relation Age of Onset  . Cancer Mother        cervical  . Stroke Mother   . Cancer Daughter 4       kidney, lung  . Heart disease Father   . Heart attack Father   . Ulcers Father   . Early death Sister 1       died at 1 months old  . Hypertension Brother   . Cancer Paternal Aunt        breast  . Hypertension Son   . Colon cancer Neg Hx        Home Medications:   Prior to Admission medications   Medication Sig Start Date End Date Taking? Authorizing Provider  budesonide-formoterol (SYMBICORT) 160-4.5 MCG/ACT inhaler Inhale 2 puffs into the lungs 2 (two) times daily. 01/13/19   Hassell Done, Mary-Margaret, FNP  ciprofloxacin (CIPRO) 500 MG tablet Take 1 tablet (500 mg total) by mouth 2 (two) times daily. 06/19/19   Hassell Done, Mary-Margaret, FNP  clonazePAM (KLONOPIN) 0.5 MG tablet TAKE 1 TABLET 3 TIMES DAILY AS NEEDED FOR ANXIETY Patient taking differently: Take 0.5 mg by mouth 3 (three) times daily as needed for anxiety.  05/09/19    [name], FNP  CREON 36000 units CPEP capsule TAKE 3 CAPSULES WITH MEALS  AND TAKE 1 CAPSULE WITH A SNACK AS DIRECTED 06/02/19   [name], FNP  diltiazem (CARDIZEM) 120 MG tablet Take 1 tablet (120 mg total) by mouth daily. 05/09/19   [name], FNP  ferrous sulfate 325 (65 FE) MG tablet Take 1 tablet (325 mg total) by mouth daily with breakfast. 10/29/15   Cherre Robins, PharmD  furosemide (LASIX) 40 MG tablet Take 1 tablet (40 mg total) by mouth daily. 06/19/19   Hassell Done, Mary-Margaret, FNP  HYDROcodone-acetaminophen (NORCO) 7.5-325 MG tablet Take 1 tablet by mouth 3 (three) times daily as needed (pain).  02/17/16   [provider]  ipratropium-albuterol (DUONEB) 0.5-2.5 (3) MG/3ML SOLN INHALE THE CONTENTS OF 1 VIAL VIA NEBULIZER EVERY 4 HOURS AS NEEDED Patient taking differently: Take 3 mLs by nebulization every 4 (four) hours as needed (for shortness of breath/wheezing).  04/27/19   Kathie Dike, MD  lipase/protease/amylase (CREON) 12000 units CPEP capsule Take 36,000 Units by mouth 3 (three) times daily with meals. Take 3 capsules with meals and 1 capsule with snacks    [provider]  Multiple Vitamins-Minerals (PRESERVISION AREDS 2) CAPS Take 1 capsule by mouth daily.     [provider]  nitroGLYCERIN (NITROSTAT) 0.4 MG SL tablet DISSOLVE 1 TABLET UNDER THE TONGUE EVERY 5 MINUTES AS NEEDED FOR CHEST PAIN AS DIRECTED Patient taking differently: Place 0.4 mg under the tongue every 5 (five) minutes as needed for chest pain.  04/28/19   Herminio Commons, MD  omeprazole (PRILOSEC) 40 MG capsule TAKE 1 CAPSULE (40 MG TOTAL) BY MOUTH DAILY. 06/02/19   Hassell Done, Mary-Margaret, FNP  OXYGEN Inhale 2 L into the lungs at bedtime. 2lpm with sleep only     [provider]  pantoprazole (PROTONIX) 40 MG tablet Take 1 tablet (40 mg total) by mouth 2 (two) times daily. 06/23/19   Hassell Done, Mary-Margaret, FNP  potassium chloride SA  (KLOR-CON) 20 MEQ tablet  06/18/19   [provider]  simvastatin (ZOCOR) 10 MG tablet Take 1 tablet (10 mg total) by mouth daily. 05/09/19   Hassell Done, Mary-Margaret, FNP  sitaGLIPtin (JANUVIA) 50 MG tablet TAKE 1 TABLET (50 MG TOTAL) BY MOUTH DAILY. Patient taking differently: Take 50 mg by mouth daily.  05/09/19   Hassell Done, Mary-Margaret, FNP  sucralfate (CARAFATE) 1 GM/10ML suspension Take 10 mLs (1 g total) by mouth 4 (four) times daily -  with meals and at bedtime. 05/22/19   Hawks, Christy A, FNP  VENTOLIN HFA 108 (90 Base) MCG/ACT inhaler USE 2 PUFFS EVERY 6 HOURS AS NEEDED Patient taking differently: Inhale 2 puffs into the lungs every 6 (six) hours as needed for wheezing or shortness of breath.  01/18/17   [name], FNP  warfarin (COUMADIN) 2 MG tablet Take 0.5 tablets (1 mg total) by mouth every evening. TAKE 1/2  TABLET (1 MG TOTAL) DAILY AT 6 PM.Get INR check by PCP the day after you are discharged 05/18/19   Thurnell Lose, MD     Allergies:     Allergies  Allergen Reactions  . Macrobid [Nitrofurantoin Monohyd Macro] Rash  . Torsemide Nausea And Vomiting  . Tramadol Nausea Only     Physical Exam:   Vitals  Blood pressure 122/74, pulse (!) 107, temperature 98.5 F (36.9 C), resp. rate 20, height 5' 2"  (1.575 m), weight 46.3 kg, SpO2 92 %.  1.  General: axoxo3  2. Psychiatric: Euthymic  3. Neurologic: cn2-12 intact, reflexes 2+ symmetric, diffuse with no clonus, motor 5/5 in all 4 ext  4. HEENMT:  Anicteric, pupils 1.7m symmetric, direct, consensual near intact  Neck:  no jvd  5. Respiratory : Bibasilar crackles, + bilateral exp wheezing,   6. Cardiovascular : Irr, irr, s1, s2,   7. Gastrointestinal:  ABd: soft, nt, nd, +bs  8. Skin:  Ext: no c/c/e,  No rash  9.Musculoskeletal:  Good ROM    Data Review:    CBC Recent Labs  Lab 07/25/19 1555  WBC 7.5  HGB 8.7*  HCT 29.8*  PLT 242  MCV 112.0*  MCH 32.7  MCHC 29.2*  RDW 14.6   LYMPHSABS 1.2  MONOABS 0.5  EOSABS 0.1  BASOSABS 0.0   ------------------------------------------------------------------------------------------------------------------  Results for orders placed or performed during the hospital encounter of 07/25/19 (from the past 48 hour(s))  Comprehensive metabolic panel     Status: Abnormal   Collection Time: 07/25/19  3:55 PM  Result Value Ref Range   Sodium 142 135 - 145 mmol/L   Potassium 3.7 3.5 - 5.1 mmol/L   Chloride 104 98 - 111 mmol/L   CO2 25 22 - 32 mmol/L   Glucose, Bld 137 (H) 70 - 99 mg/dL   BUN 34 (H) 8 - 23 mg/dL   Creatinine, Ser 1.61 (H) 0.44 - 1.00 mg/dL   Calcium 9.6 8.9 - 10.3 mg/dL   Total Protein 7.1 6.5 - 8.1 g/dL   Albumin 3.2 (L) 3.5 - 5.0 g/dL   AST 24 15 - 41 U/L   ALT 14 0 - 44 U/L   Alkaline Phosphatase 90 38 - 126 U/L   Total Bilirubin 0.5 0.3 - 1.2 mg/dL   GFR calc non Af Amer 29 (L) >60 mL/min   GFR calc Af Amer 34 (L) >60 mL/min   Anion gap 13 5 - 15    Comment: Performed at [facility], [address], [place], [place] [address]  CBC with Differential     Status: Abnormal   Collection Time: 07/25/19  3:55 PM  Result Value Ref Range   WBC 7.5 4.0 - 10.5 K/uL   RBC 2.66 (L) 3.87 - 5.11 MIL/uL   Hemoglobin 8.7 (L) 12.0 - 15.0 g/dL   HCT 29.8 (L) 36.0 - 46.0 %   MCV 112.0 (H) 80.0 - 100.0 fL   MCH 32.7 26.0 - 34.0 pg   MCHC 29.2 (L) 30.0 - 36.0 g/dL   RDW 14.6 11.5 - 15.5 %   Platelets 242 150 - 400 K/uL   nRBC 0.0 0.0 - 0.2 %   Neutrophils Relative % 76 %   Neutro Abs 5.7 1.7 - 7.7 K/uL   Lymphocytes Relative 15 %   Lymphs Abs 1.2 0.7 - 4.0 K/uL   Monocytes Relative 7 %   Monocytes Absolute 0.5 0.1 - 1.0 K/uL   Eosinophils Relative 1 %   Eosinophils Absolute 0.1 0.0 - 0.5 K/uL   Basophils Relative 1 %   Basophils Absolute 0.0 0.0 - 0.1 K/uL   Immature Granulocytes 0 %   Abs Immature Granulocytes 0.01 0.00 - 0.07 K/uL    Comment: Performed at [facility], [address], [place], [place]  [address]  Troponin I (High Sensitivity)     Status: Abnormal   Collection Time: 07/25/19  3:55 PM  Result Value Ref Range   Troponin I (High Sensitivity) 20 (H) <18 ng/L    Comment: (NOTE) Elevated high sensitivity troponin I (hsTnI) values and significant  changes across serial measurements may suggest ACS but many other  chronic and acute conditions are known to elevate hsTnI results.  Refer to the "Links" section for  chest pain algorithms and additional  guidance. Performed at [facility], [address], [place], [place] [address]   Brain natriuretic peptide     Status: Abnormal   Collection Time: 07/25/19  4:04 PM  Result Value Ref Range   B Natriuretic Peptide 790.0 (H) 0.0 - 100.0 pg/mL    Comment: Performed at [facility], [address], [place], [place] [address]  POC SARS Coronavirus 2 Ag-ED - Nasal Swab (BD Veritor Kit)     Status: None   Collection Time: 07/25/19  4:14 PM  Result Value Ref Range   SARS Coronavirus 2 Ag NEGATIVE NEGATIVE    Comment: (NOTE) SARS-CoV-2 antigen NOT DETECTED.  Negative results are presumptive.  Negative results do not preclude SARS-CoV-2 infection and should not be used as the sole basis for treatment or other patient management decisions, including infection  control decisions, particularly in the presence of clinical signs and  symptoms consistent with COVID-19, or in those who have been in contact with the virus.  Negative results must be combined with clinical observations, patient history, and epidemiological information. The expected result is Negative. Fact Sheet for Patients: PodPark.tn Fact Sheet for Healthcare Providers: GiftContent.is This test is not yet approved or cleared by the [place] FDA and  has been authorized for detection and/or diagnosis of SARS-CoV-2 by FDA under an Emergency Use Authorization (EUA).  This EUA will remain in effect (meaning this test can  be used) for the duration of  the COVID-19 de claration under Section 564(b)(1) of the Act, 21 U.S.C. section 360bbb-3(b)(1), unless the authorization is terminated or revoked sooner.   Troponin I (High Sensitivity)     Status: Abnormal   Collection Time: 07/25/19  5:54 PM  Result Value Ref Range   Troponin I (High Sensitivity) 18 (H) <18 ng/L    Comment: (NOTE) Elevated high sensitivity troponin I (hsTnI) values and significant  changes across serial measurements may suggest ACS but many other  chronic and acute conditions are known to elevate hsTnI results.  Refer to the "Links" section for chest pain algorithms and additional  guidance. Performed at [facility], [address], [place], [place] [address]    *Note: Due to a large number of results and/or encounters for the requested time period, some results have not been displayed. A complete set of results can be found in Results Review.    Chemistries  Recent Labs  Lab 07/25/19 1555  NA 142  K 3.7  CL 104  CO2 25  GLUCOSE 137*  BUN 34*  CREATININE 1.61*  CALCIUM 9.6  AST 24  ALT 14  ALKPHOS 90  BILITOT 0.5   ------------------------------------------------------------------------------------------------------------------  ------------------------------------------------------------------------------------------------------------------ GFR: Estimated Creatinine Clearance: 19.7 mL/min (A) (by C-G formula based on SCr of 1.61 mg/dL (H)). Liver Function Tests: Recent Labs  Lab 07/25/19 1555  AST 24  ALT 14  ALKPHOS 90  BILITOT 0.5  PROT 7.1  ALBUMIN 3.2*   No results for input(s): LIPASE, AMYLASE in the last 168 hours. No results for input(s): AMMONIA in the last 168 hours. Coagulation Profile: No results for input(s): INR, PROTIME in the last 168 hours. Cardiac Enzymes: No results for input(s): CKTOTAL, CKMB, CKMBINDEX, TROPONINI in the last 168 hours. BNP (last 3 results) No results for input(s): PROBNP  in the last 8760 hours. HbA1C: No results for input(s): HGBA1C in the last 72 hours. CBG: No results for input(s): GLUCAP in the last 168 hours. Lipid Profile: No results for input(s): CHOL, HDL,  LDLCALC, TRIG, CHOLHDL, LDLDIRECT in the last 72 hours. Thyroid Function Tests: No results for input(s): TSH, T4TOTAL, FREET4, T3FREE, THYROIDAB in the last 72 hours. Anemia Panel: No results for input(s): VITAMINB12, FOLATE, FERRITIN, TIBC, IRON, RETICCTPCT in the last 72 hours.  --------------------------------------------------------------------------------------------------------------- Urine analysis:    Component Value Date/Time   COLORURINE YELLOW 05/09/2019 2045   APPEARANCEUR CLOUDY (A) 05/09/2019 2045   APPEARANCEUR Cloudy (A) 08/25/2017 1732   LABSPEC 1.018 05/09/2019 2045   PHURINE 7.0 05/09/2019 2045   GLUCOSEU NEGATIVE 05/09/2019 2045   HGBUR NEGATIVE 05/09/2019 2045   BILIRUBINUR NEGATIVE 05/09/2019 2045   BILIRUBINUR Negative 08/25/2017 [place] 05/09/2019 2045   PROTEINUR 100 (A) 05/09/2019 2045   UROBILINOGEN negative 02/28/2015 1602   UROBILINOGEN 0.2 08/21/2013 1922   NITRITE NEGATIVE 05/09/2019 2045   LEUKOCYTESUR NEGATIVE 05/09/2019 2045      Imaging Results:    DG Chest Port 1 View  Result Date: 07/25/2019 CLINICAL DATA:  Shortness of breath, history of CHF and COPD on home O2 EXAM: PORTABLE CHEST 1 VIEW COMPARISON:  Most recent radiograph 05/10/2019 FINDINGS: Lung volumes are diminished with patchy mixed interstitial and airspace opacity with a basilar predominance. Small right pleural effusion is seen. Cardiac silhouette appears enlarged though is partially obscured by basilar opacity. The aorta is calcified. No acute osseous or soft tissue abnormality. Degenerative changes are present in the imaged spine and shoulders. Stable sclerotic lesion in the right proximal humerus. IMPRESSION: 1. Low lung volumes with patchy mixed interstitial and  airspace opacity with a basilar predominance. Findings are suspicious for multifocal pneumonia or aspiration though mixed interstitial and alveolar edema could have a similar appearance 2. Small right pleural effusion. 3. Stable cardiomegaly. 4.  Aortic Atherosclerosis (ICD10-I70.0). Electronically Signed   By: [name] M.D.   On: 07/25/2019 16:35    Afib at 90, LAD,  Q v1-3,  No st-t changes c/w ischemia   Assessment & Plan:    Principal Problem:   Pneumonia Active Problems:   Type 2 diabetes mellitus with kidney complication, without long-term current use of insulin (HCC)   Essential hypertension   COPD GOLD O    COPD with acute exacerbation (HCC)   CKD (chronic kidney disease), stage III (HCC) Pneumonia  Blood culture x2 Urine legionella  Urine strep antigen Rocephin 2gm iv qday Zithromax 566m iv qday  Copd exacerbation Solumedrol 832miv q8h Abx as above Incruse 1puff qday Dulera 2puff bid Albuterol HFA 2puff q6h and q6h prn   Elevated troponin Check trop I Check cardiac echo  Pafib Cont Cardizem 12084mo qday Cont Coumadin , pharmacy to dose  CHF Lasix 90m46m qday Check cmp in am  Dm2 Cont Januvia-> Tradjenta 5mg 51mqday Fsbs ac and qhs, ISS  Anemia Cont ferrous sulfate 325mg 105mday Check cbc in am  Gerd Cont PPI Cont Sucralfate  Anxiety Cont Clonazepam 0.5mg po58md  H/o pancreatic insufficiency Cont Creon   DVT Prophylaxis-   Coumadin   AM Labs Ordered, also please review Full Orders  Family Communication: Admission, patients condition and plan of care including tests being ordered have been discussed with the patient  who indicate understanding and agree with the plan and Code Status.  Code Status:  FULL CODE per patient,   Admission status: Inpatient: Based on patients clinical presentation and evaluation of above clinical data, I have made determination that patient meets Inpatient criteria at this time. Pt will require iv solumedrol  and iv  abx for Copd exacerbation , pneumonia, and has high risk of clinical deterioration. Pt will require > 2 nites stay.   Time spent in minutes : 70 minutes   Jani Gravel M.D on 07/25/2019 at 7:11 PM

## 2019-07-25 NOTE — ED Notes (Signed)
Pt spoke with daughter regarding admission.

## 2019-07-25 NOTE — ED Notes (Signed)
Dr [name] plan to order blood cultures, hold antibiotics for now.

## 2019-07-25 NOTE — ED Triage Notes (Signed)
Ems reports pt has history of chf and copd and is on 4liters of 02 at home.  Reports increasing sob x 2 days.  O2 sat 80's on 4liters.  Ems put pt on nrb and sat 97%.  Pt presently back on 4 liters.  Pt c/o cough.    EMS placed 18g in left forearm pta.

## 2019-07-25 NOTE — Progress Notes (Signed)
   Virtual Visit via telephone Note Due to COVID-19 pandemic this visit was conducted virtually. This visit type was conducted due to national recommendations for restrictions regarding the COVID-19 Pandemic (e.g. social distancing, sheltering in place) in an effort to limit this patient's exposure and mitigate transmission in our community. All issues noted in this document were discussed and addressed.  A physical exam was not performed with this format.  I connected with [name] on 07/25/19 at 1:45 by telephone and verified that I am speaking with the correct person using two identifiers. [name] is currently located at home and no one is currently with her during visit. The provider, [name], FNP is located in their office at time of visit.  I discussed the limitations, risks, security and privacy concerns of performing an evaluation and management service by telephone and the availability of in person appointments. I also discussed with the patient that there may be a patient responsible charge related to this service. The patient expressed understanding and agreed to proceed.   History and Present Illness:   Chief Complaint: URI   HPI patient has been having sob and feeling fatigued. She called EMS this  morning and they came to see her and they told her she has pneumonia. They talked her out of gong to the hospital she is on oxygen at home and 02 sat at home is 87% on 3L of o2. She has slight cough. She is SOB.   Review of Systems  Constitutional: Negative for diaphoresis and weight loss.  HENT: Positive for congestion.   Eyes: Negative for blurred vision, double vision and pain.  Respiratory: Positive for cough and shortness of breath.   Cardiovascular: Negative for chest pain, palpitations, orthopnea and leg swelling.  Gastrointestinal: Negative for abdominal pain.  Skin: Negative for rash.  Neurological: Negative for dizziness, sensory change,  loss of consciousness, weakness and headaches.  Endo/Heme/Allergies: Negative for polydipsia. Does not bruise/bleed easily.  Psychiatric/Behavioral: Negative for memory loss. The patient does not have insomnia.   All other systems reviewed and are negative.    Observations/Objective: Alert and oriented- answers all questions appropriately No distress slight cough   Assessment and Plan: [name] in today with chief complaint of URI   1. SOB (shortness of breath)  2. Cough  3. Low O2 saturation Due to multiple medical problems and o2 sat in 80's on oxygen at 3l - needs to go to the hospital. Patient will call ES to come back and take her o the hospital.   Follow Up Instructions: prn    I discussed the assessment and treatment plan with the patient. The patient was provided an opportunity to ask questions and all were answered. The patient agreed with the plan and demonstrated an understanding of the instructions.   The patient was advised to call back or seek an in-person evaluation if the symptoms worsen or if the condition fails to improve as anticipated.  The above assessment and management plan was discussed with the patient. The patient verbalized understanding of and has agreed to the management plan. Patient is aware to call the clinic if symptoms persist or worsen. Patient is aware when to return to the clinic for a follow-up visit. Patient educated on when it is appropriate to go to the emergency department.   Time call ended:  2:00  I provided 15 minutes of non-face-to-face time during this encounter.    [name], FNP

## 2019-07-25 NOTE — ED Notes (Signed)
Bruise to right forehead from leaned forward in bathroom and hit head on sink, denies LOC.

## 2019-07-26 ENCOUNTER — Encounter (HOSPITAL_COMMUNITY): Payer: Self-pay | Admitting: Internal Medicine

## 2019-07-26 DIAGNOSIS — N1832 Chronic kidney disease, stage 3b: Secondary | ICD-10-CM

## 2019-07-26 DIAGNOSIS — I509 Heart failure, unspecified: Secondary | ICD-10-CM

## 2019-07-26 DIAGNOSIS — J449 Chronic obstructive pulmonary disease, unspecified: Secondary | ICD-10-CM

## 2019-07-26 DIAGNOSIS — J441 Chronic obstructive pulmonary disease with (acute) exacerbation: Secondary | ICD-10-CM

## 2019-07-26 DIAGNOSIS — E1121 Type 2 diabetes mellitus with diabetic nephropathy: Secondary | ICD-10-CM

## 2019-07-26 DIAGNOSIS — I10 Essential (primary) hypertension: Secondary | ICD-10-CM

## 2019-07-26 LAB — COMPREHENSIVE METABOLIC PANEL
ALT: 15 U/L (ref 0–44)
AST: 22 U/L (ref 15–41)
Albumin: 2.8 g/dL — ABNORMAL LOW (ref 3.5–5.0)
Alkaline Phosphatase: 83 U/L (ref 38–126)
Anion gap: 11 (ref 5–15)
BUN: 39 mg/dL — ABNORMAL HIGH (ref 8–23)
CO2: 25 mmol/L (ref 22–32)
Calcium: 9.3 mg/dL (ref 8.9–10.3)
Chloride: 106 mmol/L (ref 98–111)
Creatinine, Ser: 1.62 mg/dL — ABNORMAL HIGH (ref 0.44–1.00)
GFR calc Af Amer: 34 mL/min — ABNORMAL LOW (ref >60)
GFR calc non Af Amer: 29 mL/min — ABNORMAL LOW (ref >60)
Glucose, Bld: 184 mg/dL — ABNORMAL HIGH (ref 70–99)
Potassium: 4.8 mmol/L (ref 3.5–5.1)
Sodium: 142 mmol/L (ref 135–145)
Total Bilirubin: 0.6 mg/dL (ref 0.3–1.2)
Total Protein: 6.6 g/dL (ref 6.5–8.1)

## 2019-07-26 LAB — SARS CORONAVIRUS 2 (TAT 6-24 HRS): SARS Coronavirus 2: NEGATIVE

## 2019-07-26 LAB — GLUCOSE, CAPILLARY
Glucose-Capillary: 215 mg/dL — ABNORMAL HIGH (ref 70–99)
Glucose-Capillary: 160 mg/dL — ABNORMAL HIGH (ref 70–99)
Glucose-Capillary: 193 mg/dL — ABNORMAL HIGH (ref 70–99)

## 2019-07-26 LAB — CBC
HCT: 29.1 % — ABNORMAL LOW (ref 36.0–46.0)
Hemoglobin: 8.7 g/dL — ABNORMAL LOW (ref 12.0–15.0)
MCH: 32.8 pg (ref 26.0–34.0)
MCHC: 29.9 g/dL — ABNORMAL LOW (ref 30.0–36.0)
MCV: 109.8 fL — ABNORMAL HIGH (ref 80.0–100.0)
Platelets: 233 10*3/uL (ref 150–400)
RBC: 2.65 MIL/uL — ABNORMAL LOW (ref 3.87–5.11)
RDW: 14.7 % (ref 11.5–15.5)
WBC: 4.5 10*3/uL (ref 4.0–10.5)
nRBC: 0 % (ref 0.0–0.2)

## 2019-07-26 LAB — PROTIME-INR
INR: 2 — ABNORMAL HIGH (ref 0.8–1.2)
Prothrombin Time: 22.7 seconds — ABNORMAL HIGH (ref 11.4–15.2)

## 2019-07-26 LAB — HIV ANTIBODY (ROUTINE TESTING W REFLEX): HIV Screen 4th Generation wRfx: NONREACTIVE

## 2019-07-26 LAB — CBG MONITORING, ED: Glucose-Capillary: 170 mg/dL — ABNORMAL HIGH (ref 70–99)

## 2019-07-26 MED ORDER — ARFORMOTEROL TARTRATE 15 MCG/2ML IN NEBU
15.0000 ug | INHALATION_SOLUTION | Freq: Two times a day (BID) | RESPIRATORY_TRACT | Status: DC
  Administered 2019-07-26 – 2019-08-08 (×26): 15 ug via RESPIRATORY_TRACT
  Filled 2019-07-26 (×26): qty 2

## 2019-07-26 MED ORDER — DM-GUAIFENESIN ER 30-600 MG PO TB12
1.0000 | ORAL_TABLET | Freq: Two times a day (BID) | ORAL | Status: DC
  Administered 2019-07-26 – 2019-08-08 (×26): 1 via ORAL
  Filled 2019-07-26 (×26): qty 1

## 2019-07-26 MED ORDER — BUDESONIDE 0.5 MG/2ML IN SUSP
0.5000 mg | Freq: Two times a day (BID) | RESPIRATORY_TRACT | Status: DC
  Administered 2019-07-26 – 2019-08-08 (×26): 0.5 mg via RESPIRATORY_TRACT
  Filled 2019-07-26 (×25): qty 2

## 2019-07-26 MED ORDER — INSULIN GLARGINE 100 UNIT/ML ~~LOC~~ SOLN
7.0000 [IU] | Freq: Every day | SUBCUTANEOUS | Status: DC
  Administered 2019-07-26 – 2019-08-07 (×12): 7 [IU] via SUBCUTANEOUS
  Filled 2019-07-26 (×14): qty 0.07

## 2019-07-26 NOTE — Progress Notes (Signed)
ANTICOAGULATION CONSULT NOTE -   Pharmacy Consult for warfarin dosing  Indication: atrial fibrillation   Allergies  Allergen Reactions  . Macrobid [Nitrofurantoin Monohyd Macro] Rash  . Torsemide Nausea And Vomiting  . Tramadol Nausea Only      Patient Measurements: Last Weight  Most recent update: 07/25/2019  3:27 PM   Weight  46.3 kg (102 lb)           Body mass index is 18.66 kg/m. [name]               BP: 139/82 (12/16 0938) Pulse Rate: 114 (12/16 0938)  Labs: Recent Labs    07/25/19 1555 07/26/19 0520  HGB 8.7* 8.7*  HCT 29.8* 29.1*  PLT 242 233  LABPROT 22.1* 22.7*  INR 2.0* 2.0*  CREATININE 1.61* 1.62*    Estimated Creatinine Clearance: 19.6 mL/min (A) (by C-G formula based on SCr of 1.62 mg/dL (H)).  Goal of Therapy:  INR 2-3 Monitor CBC     Prior to Admission Warfarin Dosing:  Devita Nies takes 1mg  of warfarin daily    Admit INR was 2.0 Lab Results  Component Value Date   INR 2.0 (H) 07/26/2019   INR 2.0 (H) 07/25/2019   INR 1.8 (H) 06/27/2019    Assessment: [name] a 84 y.o. female requires anticoagulation with warfarin for the indication of  Afib. Potential for drug drug interaction with azithromycin, will monitor closely. Warfarin will be initiated inpatient following pharmacy protocol per pharmacy consult. Patient most recent blood work is as follows:   CBC Latest Ref Rng & Units 07/26/2019 07/25/2019 05/23/2019  WBC 4.0 - 10.5 K/uL 4.5 7.5 9.6  Hemoglobin 12.0 - 15.0 g/dL 8.7(L) 8.7(L) 9.0(L)  Hematocrit 36.0 - 46.0 % 29.1(L) 29.8(L) 28.1(L)  Platelets 150 - 400 K/uL 233 242 296     Plan: Continue Warfarin 1mg  po daily  Monitor CBC qmwf with am labs   Monitor INR daily Monitor for signs and symptoms of bleeding   Margot Ables, PharmD Clinical Pharmacist 07/26/2019 10:44 AM

## 2019-07-26 NOTE — Telephone Encounter (Signed)
Kilgore, Kem D, CMA  11:11 AM Note   LMOVM to follow up to see if the pt has received her home remote monitor. I left my direct office number for the pt to call back.

## 2019-07-26 NOTE — Telephone Encounter (Signed)
LMOVM to follow up to see if the pt has received her home remote monitor. I left my direct office number for the pt to call back.

## 2019-07-26 NOTE — Progress Notes (Addendum)
PROGRESS NOTE    [name]  CHY:[id number] DOB: 01-14-1935 DOA: 07/25/2019 PCP: [name], FNP     Brief Narrative:  As per H&P written by Dr. [name] on 07/25/2019 84 y.o. female,  w hypertension, hyperlipidemia, Dm2, CKD stage 3, Anemia, h/o pacer, Pafib, CAD, mild- mod Aortic regurgitation, mild Mitral regurgitation, CHF (EF 55-60%) , h/o  Covid-19 infection, Copd, c/o increase in dyspnea for the past 5 days,  Pt has had cough w grey sputum, pt denies fever, chills, cp, palp, n/v, abd pain, diarrhea, brbpr, black stool,   In ED,  T 98.5, P 64, R 16, Bp 119/66 pox 95%on 4L [place] WT 46.3  CXR IMPRESSION: 1. Low lung volumes with patchy mixed interstitial and airspace opacity with a basilar predominance. Findings are suspicious for multifocal pneumonia or aspiration though mixed interstitial and alveolar edema could have a similar appearance 2. Small right pleural effusion. 3. Stable cardiomegaly.  Na 142, K 3.7, Bun 34, creatinine 1.61 ASt 24, Alt 14 Wbc 7.5, Hgb 8.7, Plt 242 Trop 20 -> 18 BNP 790    COVID test negative  Assessment & Plan: 1-acute on chronic respiratory failure in the setting of multifocal pneumonia, COPD exacerbation and CHF exacerbation. -Patient heart failure exacerbation is acute on chronic diastolic heart failure. -Continue daily weights, low-sodium diet, IV Lasix. -Follow strict I's and O's. -For her COPD will continue the use of IV steroids, start patient on Brovana, Pulmicort and as needed bronchodilators. -Patient also started on flutter valve and Mucinex. -Continue antibiotics for multifocal pneumonia component.   -Wean off oxygen supplementation as tolerated (patient at baseline using 2-3 L).  2-chronic atrial fibrillation -Rate control currently -Continue Coumadin per pharmacy -Continue Cardizem -Monitoring on telemetry.  3-Type 2 diabetes mellitus with kidney complication, without long-term current use of insulin  ([place]) -While inpatient will hold oral hypoglycemic agents -Continue sliding scale insulin and Lantus.  4-Essential hypertension -Stable overall -Continue current antihypertensive regimen.  5-chronic kidney disease is stage III b. -Appears to be stable -Close monitoring of electrolytes and renal function in the setting of acute IV diuresis.  6-gastroesophageal reflux disease -Continue PPI and Carafate.  7-history of chronic pancreatitis -Continue Creon -Patient reports no abdominal pain, nausea or vomiting currently.  8-hyperlipidemia -Continue statins  9-urinary retention -Bladder scan demonstrated over 800 mL retained -Foley catheter has been placed -voiding trial to be attempted in 1-2 days.  DVT prophylaxis: Chronically on Coumadin Code Status: Full code Family Communication: No family at bedside. Disposition Plan: Remains inpatient, continue treatment for CHF and COPD exacerbation; negative Covid 19 test; safe to use nebulizer management.  Foley catheter placed secondary to urinary retention.  Continue monitoring Benicar response. Consultants:   None  Procedures:   See below for x-ray reports  Antimicrobials:  Anti-infectives (From admission, onward)   Start     Dose/Rate Route Frequency Ordered Stop   07/25/19 1830  cefTRIAXone (ROCEPHIN) 2 g in sodium chloride 0.9 % 100 mL IVPB     2 g 200 mL/hr over 30 Minutes Intravenous Every 24 hours 07/25/19 1826 07/30/19 1829   07/25/19 1830  azithromycin (ZITHROMAX) 500 mg in sodium chloride 0.9 % 250 mL IVPB     500 mg 250 mL/hr over 60 Minutes Intravenous Every 24 hours 07/25/19 1826 07/30/19 1829      Subjective: Afebrile, still complaining of shortness of breath, mild orthopnea and having difficulty speaking in full sentences.  Patient is requiring 6 L nasal cannula supplementation to maintain O2 sat above  90%.  Urinary retention with a bladder scan demonstrating more than 920 mL appreciated.  Signs of fluid  overload on exam.  Objective: Vitals:   07/26/19 0400 07/26/19 0700 07/26/19 0900 07/26/19 0938  BP: (!) 107/48 (!) 97/57 121/71 139/82  Pulse: 75 74 85 (!) 114  Resp: 20 20 (!) 23 20  Temp:      SpO2: 95% 90% 94% 94%  Weight:      Height:        Intake/Output Summary (Last 24 hours) at 07/26/2019 1618 Last data filed at 07/26/2019 1300 Gross per 24 hour  Intake 240 ml  Output --  Net 240 ml   Filed Weights   07/25/19 1527  Weight: 46.3 kg    Examination: General exam: Alert, awake, oriented x 2 (missed date); afebrile, reports feeling short of breath and having orthopnea.  Unable to speak in full sentences requiring 6 L nasal cannula supplementation to maintain O2 sat above 90%. Respiratory system: Fine crackles at the bases, positive rhonchi, positive expiratory wheezing; no using accessory muscles. Cardiovascular system:Rate controlled, no rubs, no gallops, 2++ edema bilateral, positive mild JVD. Gastrointestinal system: Abdomen is nondistended, soft and nontender. No organomegaly or masses felt. Normal bowel sounds heard. Central nervous system: Alert and oriented. No focal neurological deficits. Extremities: No cyanosis or clubbing; 2+ edema bilaterally.  Chronic stasis dermatitis appreciated Skin: Chronic lower extremity changes from stasis dermatitis and prolonged leg edema on exam; no signs of superimposed infection. Psychiatry: Mood & affect appropriate.     Data Reviewed: I have personally reviewed following labs and imaging studies  CBC: Recent Labs  Lab 07/25/19 1555 07/26/19 0520  WBC 7.5 4.5  NEUTROABS 5.7  --   HGB 8.7* 8.7*  HCT 29.8* 29.1*  MCV 112.0* 109.8*  PLT 242 323   Basic Metabolic Panel: Recent Labs  Lab 07/25/19 1555 07/26/19 0520  NA 142 142  K 3.7 4.8  CL 104 106  CO2 25 25  GLUCOSE 137* 184*  BUN 34* 39*  CREATININE 1.61* 1.62*  CALCIUM 9.6 9.3   GFR: Estimated Creatinine Clearance: 19.6 mL/min (A) (by C-G formula based  on SCr of 1.62 mg/dL (H)).   Liver Function Tests: Recent Labs  Lab 07/25/19 1555 07/26/19 0520  AST 24 22  ALT 14 15  ALKPHOS 90 83  BILITOT 0.5 0.6  PROT 7.1 6.6  ALBUMIN 3.2* 2.8*   Coagulation Profile: Recent Labs  Lab 07/25/19 1555 07/26/19 0520  INR 2.0* 2.0*   CBG: Recent Labs  Lab 07/25/19 2112 07/26/19 0821 07/26/19 1146 07/26/19 1613  GLUCAP 153* 170* 215* 160*   Urine analysis:    Component Value Date/Time   COLORURINE YELLOW 05/09/2019 2045   APPEARANCEUR CLOUDY (A) 05/09/2019 2045   APPEARANCEUR Cloudy (A) 08/25/2017 1732   LABSPEC 1.018 05/09/2019 2045   PHURINE 7.0 05/09/2019 2045   GLUCOSEU NEGATIVE 05/09/2019 2045   HGBUR NEGATIVE 05/09/2019 2045   BILIRUBINUR NEGATIVE 05/09/2019 2045   BILIRUBINUR Negative 08/25/2017 [place] 05/09/2019 2045   PROTEINUR 100 (A) 05/09/2019 2045   UROBILINOGEN negative 02/28/2015 1602   UROBILINOGEN 0.2 08/21/2013 1922   NITRITE NEGATIVE 05/09/2019 2045   LEUKOCYTESUR NEGATIVE 05/09/2019 2045    Recent Results (from the past 240 hour(s))  Culture, blood (Routine X 2) w Reflex to ID Panel     Status: None (Preliminary result)   Collection Time: 07/25/19  5:54 PM   Specimen: Right Antecubital; Blood  Result Value Ref Range  Status   Specimen Description RIGHT ANTECUBITAL  Final   Special Requests   Final    BOTTLES DRAWN AEROBIC AND ANAEROBIC Blood Culture adequate volume   Culture   Final    NO GROWTH < 24 HOURS Performed at [facility], [address], [place], [place] [address]    Report Status PENDING  Incomplete  Culture, blood (Routine X 2) w Reflex to ID Panel     Status: None (Preliminary result)   Collection Time: 07/25/19  7:09 PM   Specimen: BLOOD LEFT WRIST  Result Value Ref Range Status   Specimen Description BLOOD LEFT WRIST  Final   Special Requests   Final    BOTTLES DRAWN AEROBIC AND ANAEROBIC Blood Culture adequate volume   Culture   Final    NO GROWTH < 12  HOURS Performed at [facility], [address], [place], [place] [address]    Report Status PENDING  Incomplete  SARS CORONAVIRUS 2 (TAT 6-24 HRS) Nasopharyngeal Nasopharyngeal Swab     Status: None   Collection Time: 07/25/19  7:13 PM   Specimen: Nasopharyngeal Swab  Result Value Ref Range Status   SARS Coronavirus 2 NEGATIVE NEGATIVE Final    Comment: (NOTE) SARS-CoV-2 target nucleic acids are NOT DETECTED. The SARS-CoV-2 RNA is generally detectable in upper and lower respiratory specimens during the acute phase of infection. Negative results do not preclude SARS-CoV-2 infection, do not rule out co-infections with other pathogens, and should not be used as the sole basis for treatment or other patient management decisions. Negative results must be combined with clinical observations, patient history, and epidemiological information. The expected result is Negative. Fact Sheet for Patients: SugarRoll.be Fact Sheet for Healthcare Providers: https://www.woods-mathews.com/ This test is not yet approved or cleared by the [place] FDA and  has been authorized for detection and/or diagnosis of SARS-CoV-2 by FDA under an Emergency Use Authorization (EUA). This EUA will remain  in effect (meaning this test can be used) for the duration of the COVID-19 declaration under Section 56 4(b)(1) of the Act, 21 U.S.C. section 360bbb-3(b)(1), unless the authorization is terminated or revoked sooner. Performed at [facility], [place] [address] [place], [place] [address]      Radiology Studies: DG Chest Port 1 View  Result Date: 07/25/2019 CLINICAL DATA:  Shortness of breath, history of CHF and COPD on home O2 EXAM: PORTABLE CHEST 1 VIEW COMPARISON:  Most recent radiograph 05/10/2019 FINDINGS: Lung volumes are diminished with patchy mixed interstitial and airspace opacity with a basilar predominance. Small right pleural effusion is seen. Cardiac  silhouette appears enlarged though is partially obscured by basilar opacity. The aorta is calcified. No acute osseous or soft tissue abnormality. Degenerative changes are present in the imaged spine and shoulders. Stable sclerotic lesion in the right proximal humerus. IMPRESSION: 1. Low lung volumes with patchy mixed interstitial and airspace opacity with a basilar predominance. Findings are suspicious for multifocal pneumonia or aspiration though mixed interstitial and alveolar edema could have a similar appearance 2. Small right pleural effusion. 3. Stable cardiomegaly. 4.  Aortic Atherosclerosis (ICD10-I70.0). Electronically Signed   By: [name] M.D.   On: 07/25/2019 16:35   Scheduled Meds:  arformoterol  15 mcg Nebulization BID   budesonide (PULMICORT) nebulizer solution  0.5 mg Nebulization BID   dextromethorphan-guaiFENesin  1 tablet Oral BID   diltiazem  120 mg Oral Daily   feeding supplement (PRO-STAT SUGAR FREE 64)  30 mL Oral BID   ferrous sulfate  325 mg Oral Q breakfast   furosemide  40 mg Intravenous Daily   insulin aspart  0-5 Units Subcutaneous QHS   insulin aspart  0-9 Units Subcutaneous TID WC   insulin glargine  7 Units Subcutaneous QHS   lipase/protease/amylase  36,000 Units Oral TID WC   methylPREDNISolone (SOLU-MEDROL) injection  80 mg Intravenous Q8H   multivitamin-lutein  1 capsule Oral Daily   pantoprazole  40 mg Oral BID   potassium chloride SA  20 mEq Oral Daily   simvastatin  10 mg Oral Daily   sucralfate  1 g Oral TID WC & HS   warfarin  1 mg Oral q1800   Warfarin - Pharmacist Dosing Inpatient   Does not apply q1800   Continuous Infusions:  azithromycin Stopped (07/25/19 2121)   cefTRIAXone (ROCEPHIN)  IV Stopped (07/25/19 2015)     LOS: 1 day    Time spent: 30 minutes.   Barton Dubois, MD [facility] Pager [phone]  07/26/2019, 4:18 PM

## 2019-07-27 LAB — PROTIME-INR
INR: 2.1 — ABNORMAL HIGH (ref 0.8–1.2)
Prothrombin Time: 23.5 seconds — ABNORMAL HIGH (ref 11.4–15.2)

## 2019-07-27 LAB — GLUCOSE, CAPILLARY
Glucose-Capillary: 154 mg/dL — ABNORMAL HIGH (ref 70–99)
Glucose-Capillary: 217 mg/dL — ABNORMAL HIGH (ref 70–99)
Glucose-Capillary: 157 mg/dL — ABNORMAL HIGH (ref 70–99)
Glucose-Capillary: 166 mg/dL — ABNORMAL HIGH (ref 70–99)

## 2019-07-27 MED ORDER — FUROSEMIDE 10 MG/ML IJ SOLN
30.0000 mg | Freq: Two times a day (BID) | INTRAMUSCULAR | Status: DC
  Administered 2019-07-27: 30 mg via INTRAVENOUS
  Filled 2019-07-27: qty 4

## 2019-07-27 MED ORDER — LORAZEPAM 2 MG/ML IJ SOLN
0.5000 mg | Freq: Once | INTRAMUSCULAR | Status: AC
  Administered 2019-07-27: 0.5 mg via INTRAVENOUS
  Filled 2019-07-27: qty 1

## 2019-07-27 MED ORDER — CHLORHEXIDINE GLUCONATE CLOTH 2 % EX PADS
6.0000 | MEDICATED_PAD | Freq: Every day | CUTANEOUS | Status: DC
  Administered 2019-07-28: 6 via TOPICAL

## 2019-07-27 NOTE — Progress Notes (Signed)
MD notified via text page about pt being very restless. She has been up and down out of bed. She is complaining of not being able to get comfortable. She has called the staff into the room multiple, numerous times over the last couple of hours. She is requesting something for sleep. I told the patient it was too late in the night to have something for sleep but that I would ask for an order for something for her anxiety. MD gave order for 0.5 IV ativan. Med given. Pt now resting comfortably in bed with eyes closed.

## 2019-07-27 NOTE — Progress Notes (Signed)
PROGRESS NOTE    [name]  ZOX:[id number] DOB: February 02, 1935 DOA: 07/25/2019 PCP: [name], FNP     Brief Narrative:  As per H&P written by Dr. [name] on 07/25/2019 84 y.o. female,  w hypertension, hyperlipidemia, Dm2, CKD stage 3, Anemia, h/o pacer, Pafib, CAD, mild- mod Aortic regurgitation, mild Mitral regurgitation, CHF (EF 55-60%) , h/o  Covid-19 infection, Copd, c/o increase in dyspnea for the past 5 days,  Pt has had cough w grey sputum, pt denies fever, chills, cp, palp, n/v, abd pain, diarrhea, brbpr, black stool,   In ED,  T 98.5, P 64, R 16, Bp 119/66 pox 95%on 4L [place] WT 46.3  CXR IMPRESSION: 1. Low lung volumes with patchy mixed interstitial and airspace opacity with a basilar predominance. Findings are suspicious for multifocal pneumonia or aspiration though mixed interstitial and alveolar edema could have a similar appearance 2. Small right pleural effusion. 3. Stable cardiomegaly.  Na 142, K 3.7, Bun 34, creatinine 1.61 ASt 24, Alt 14 Wbc 7.5, Hgb 8.7, Plt 242 Trop 20 -> 18 BNP 790    COVID test negative  Assessment & Plan: 1-acute on chronic respiratory failure in the setting of multifocal pneumonia, COPD exacerbation and CHF exacerbation. -Patient heart failure exacerbation is acute on chronic diastolic heart failure. -Continue daily weights, low-sodium diet, IV Lasix. -Follow strict I's and O's. -For her COPD will continue the use of IV steroids, start patient on Brovana, Pulmicort and as needed bronchodilators. -Patient also started on flutter valve and Mucinex. -Continue antibiotics for multifocal pneumonia component.   -Wean off oxygen supplementation as tolerated (patient at baseline using 2-3 L).  2-chronic atrial fibrillation -Rate control currently -Continue Coumadin per pharmacy -Continue Cardizem -Monitoring on telemetry.  3-Type 2 diabetes mellitus with kidney complication, without long-term current use of insulin  ([place]) -While inpatient will hold oral hypoglycemic agents -Continue sliding scale insulin and Lantus.  4-Essential hypertension -Stable overall -Continue current antihypertensive regimen.  5-chronic kidney disease is stage III b. -Appears to be stable -Close monitoring of electrolytes and renal function in the setting of acute IV diuresis.  6-gastroesophageal reflux disease -Continue PPI and Carafate.  7-history of chronic pancreatitis -Continue Creon -Patient reports no abdominal pain, nausea or vomiting currently.  8-hyperlipidemia -Continue statins  9-urinary retention -Bladder scan demonstrated over 800 mL retained -Foley catheter has been placed -voiding trial to be attempted in 1-2 days.  DVT prophylaxis: Chronically on Coumadin Code Status: Full code Family Communication: No family at bedside. Disposition Plan: Remains inpatient, continue treatment for CHF and COPD exacerbation; negative Covid 19 test; safe to use nebulizer management.  Foley catheter placed secondary to urinary retention.  Continue monitoring Benicar response.  Consultants:   None  Procedures:   See below for x-ray reports  Antimicrobials:  Anti-infectives (From admission, onward)   Start     Dose/Rate Route Frequency Ordered Stop   07/25/19 1830  cefTRIAXone (ROCEPHIN) 2 g in sodium chloride 0.9 % 100 mL IVPB     2 g 200 mL/hr over 30 Minutes Intravenous Every 24 hours 07/25/19 1826 07/30/19 1829   07/25/19 1830  azithromycin (ZITHROMAX) 500 mg in sodium chloride 0.9 % 250 mL IVPB     500 mg 250 mL/hr over 60 Minutes Intravenous Every 24 hours 07/25/19 1826 07/30/19 1829      Subjective: No fever, still complaining of shortness of breath, requiring 6 7 L nasal cannula supplementation.  Patient reports orthopnea and lower extremity swelling.  Positive wheezing appreciated on exam.  Objective: Vitals:   07/26/19 1958 07/26/19 2125 07/27/19 0627 07/27/19 1433  BP:  118/66 109/73 120/60   Pulse:  81  88  Resp:  20 14 (!) 22  Temp:  97.8 F (36.6 C) 97.9 F (36.6 C) 97.7 F (36.5 C)  TempSrc:  Oral Oral Oral  SpO2: 91% 97% 96% 92%  Weight:      Height:        Intake/Output Summary (Last 24 hours) at 07/27/2019 1636 Last data filed at 07/27/2019 1624 Gross per 24 hour  Intake 589.42 ml  Output 1075 ml  Net -485.58 ml   Filed Weights   07/25/19 1527 07/26/19 1024  Weight: 46.3 kg 46.3 kg    Examination: General exam: Alert, awake, oriented x 2; no fever, still having difficulty speaking in full sentences, complaining of orthopnea and shortness of breath.  Requiring higher levels oxygen supplementation to maintain O2 sat above 90% (currently 6-7 L nasal cannula). Respiratory system: Positive rhonchi bilaterally, mild expiratory wheezing, fine crackles at the bases; no using accessory muscles. Cardiovascular system:Rate controlled, no rubs, no gallops, 2+ edema bilaterally, positive mild JVD. Gastrointestinal system: Abdomen is nondistended, soft and nontender. No organomegaly or masses felt. Normal bowel sounds heard. Central nervous system: Alert and oriented. No focal neurological deficits. Extremities/skin: No cyanosis or clubbing.  2+ edema bilaterally appreciated.  Chronic abscesses dermatitis appreciated on exam. Psychiatry: Mood & affect appropriate currently; nursing staff reporting overnight restlessness and insomnia..    Data Reviewed: I have personally reviewed following labs and imaging studies  CBC: Recent Labs  Lab 07/25/19 1555 07/26/19 0520  WBC 7.5 4.5  NEUTROABS 5.7  --   HGB 8.7* 8.7*  HCT 29.8* 29.1*  MCV 112.0* 109.8*  PLT 242 660   Basic Metabolic Panel: Recent Labs  Lab 07/25/19 1555 07/26/19 0520  NA 142 142  K 3.7 4.8  CL 104 106  CO2 25 25  GLUCOSE 137* 184*  BUN 34* 39*  CREATININE 1.61* 1.62*  CALCIUM 9.6 9.3   GFR: Estimated Creatinine Clearance: 19.6 mL/min (A) (by C-G formula based on SCr of 1.62 mg/dL (H)).    Liver Function Tests: Recent Labs  Lab 07/25/19 1555 07/26/19 0520  AST 24 22  ALT 14 15  ALKPHOS 90 83  BILITOT 0.5 0.6  PROT 7.1 6.6  ALBUMIN 3.2* 2.8*   Coagulation Profile: Recent Labs  Lab 07/25/19 1555 07/26/19 0520 07/27/19 0521  INR 2.0* 2.0* 2.1*   CBG: Recent Labs  Lab 07/26/19 1146 07/26/19 1613 07/26/19 2129 07/27/19 0705 07/27/19 1152  GLUCAP 215* 160* 193* 154* 217*   Urine analysis:    Component Value Date/Time   COLORURINE YELLOW 05/09/2019 2045   APPEARANCEUR CLOUDY (A) 05/09/2019 2045   APPEARANCEUR Cloudy (A) 08/25/2017 1732   LABSPEC 1.018 05/09/2019 2045   PHURINE 7.0 05/09/2019 2045   GLUCOSEU NEGATIVE 05/09/2019 2045   HGBUR NEGATIVE 05/09/2019 2045   BILIRUBINUR NEGATIVE 05/09/2019 2045   BILIRUBINUR Negative 08/25/2017 [place] 05/09/2019 2045   PROTEINUR 100 (A) 05/09/2019 2045   UROBILINOGEN negative 02/28/2015 1602   UROBILINOGEN 0.2 08/21/2013 1922   NITRITE NEGATIVE 05/09/2019 2045   LEUKOCYTESUR NEGATIVE 05/09/2019 2045    Recent Results (from the past 240 hour(s))  Culture, blood (Routine X 2) w Reflex to ID Panel     Status: None (Preliminary result)   Collection Time: 07/25/19  5:54 PM   Specimen: Right Antecubital; Blood  Result Value Ref Range Status  Specimen Description RIGHT ANTECUBITAL  Final   Special Requests   Final    BOTTLES DRAWN AEROBIC AND ANAEROBIC Blood Culture adequate volume   Culture   Final    NO GROWTH 2 DAYS Performed at [facility], [address] [place], [place] [address]    Report Status PENDING  Incomplete  Culture, blood (Routine X 2) w Reflex to ID Panel     Status: None (Preliminary result)   Collection Time: 07/25/19  7:09 PM   Specimen: BLOOD LEFT WRIST  Result Value Ref Range Status   Specimen Description BLOOD LEFT WRIST  Final   Special Requests   Final    BOTTLES DRAWN AEROBIC AND ANAEROBIC Blood Culture adequate volume   Culture   Final    NO GROWTH 2  DAYS Performed at [facility], [address] [place], [place] [address]    Report Status PENDING  Incomplete  SARS CORONAVIRUS 2 (TAT 6-24 HRS) Nasopharyngeal Nasopharyngeal Swab     Status: None   Collection Time: 07/25/19  7:13 PM   Specimen: Nasopharyngeal Swab  Result Value Ref Range Status   SARS Coronavirus 2 NEGATIVE NEGATIVE Final    Comment: (NOTE) SARS-CoV-2 target nucleic acids are NOT DETECTED. The SARS-CoV-2 RNA is generally detectable in upper and lower respiratory specimens during the acute phase of infection. Negative results do not preclude SARS-CoV-2 infection, do not rule out co-infections with other pathogens, and should not be used as the sole basis for treatment or other patient management decisions. Negative results must be combined with clinical observations, patient history, and epidemiological information. The expected result is Negative. Fact Sheet for Patients: SugarRoll.be Fact Sheet for Healthcare Providers: https://www.woods-mathews.com/ This test is not yet approved or cleared by the [place] FDA and  has been authorized for detection and/or diagnosis of SARS-CoV-2 by FDA under an Emergency Use Authorization (EUA). This EUA will remain  in effect (meaning this test can be used) for the duration of the COVID-19 declaration under Section 56 4(b)(1) of the Act, 21 U.S.C. section 360bbb-3(b)(1), unless the authorization is terminated or revoked sooner. Performed at [facility], [place] [address] [place], [place] [address]      Radiology Studies: No results found.   Scheduled Meds: . arformoterol  15 mcg Nebulization BID  . budesonide (PULMICORT) nebulizer solution  0.5 mg Nebulization BID  . dextromethorphan-guaiFENesin  1 tablet Oral BID  . diltiazem  120 mg Oral Daily  . feeding supplement (PRO-STAT SUGAR FREE 64)  30 mL Oral BID  . ferrous sulfate  325 mg Oral Q breakfast  . furosemide  40  mg Intravenous Daily  . insulin aspart  0-5 Units Subcutaneous QHS  . insulin aspart  0-9 Units Subcutaneous TID WC  . insulin glargine  7 Units Subcutaneous QHS  . lipase/protease/amylase  36,000 Units Oral TID WC  . methylPREDNISolone (SOLU-MEDROL) injection  80 mg Intravenous Q8H  . multivitamin-lutein  1 capsule Oral Daily  . pantoprazole  40 mg Oral BID  . potassium chloride SA  20 mEq Oral Daily  . simvastatin  10 mg Oral Daily  . sucralfate  1 g Oral TID WC & HS  . warfarin  1 mg Oral q1800  . Warfarin - Pharmacist Dosing Inpatient   Does not apply q1800   Continuous Infusions: . azithromycin 500 mg (07/26/19 1803)  . cefTRIAXone (ROCEPHIN)  IV 2 g (07/26/19 1809)     LOS: 2 days    Time spent: 30  minutes.   Barton Dubois, MD [facility] Pager [phone]  07/27/2019, 4:36 PM

## 2019-07-27 NOTE — Progress Notes (Signed)
ANTICOAGULATION CONSULT NOTE -   Pharmacy Consult for warfarin dosing  Indication: atrial fibrillation   Allergies  Allergen Reactions  . Macrobid [Nitrofurantoin Monohyd Macro] Rash  . Torsemide Nausea And Vomiting  . Tramadol Nausea Only      Patient Measurements: Last Weight  Most recent update: 07/26/2019  6:55 PM   Weight  46.3 kg (102 lb)           Body mass index is 18.66 kg/m. [name]               Temp: 97.9 F (36.6 C) (12/17 0627) Temp Source: Oral (12/17 0627) BP: 109/73 (12/17 0627)  Labs: Recent Labs    07/25/19 1555 07/26/19 0520 07/27/19 0521  HGB 8.7* 8.7*  --   HCT 29.8* 29.1*  --   PLT 242 233  --   LABPROT 22.1* 22.7* 23.5*  INR 2.0* 2.0* 2.1*  CREATININE 1.61* 1.62*  --     Estimated Creatinine Clearance: 19.6 mL/min (A) (by C-G formula based on SCr of 1.62 mg/dL (H)).  Goal of Therapy:  INR 2-3 Monitor CBC     Prior to Admission Warfarin Dosing:  Sullivan Blasing takes 1mg  of warfarin daily    Admit INR was 2.0 Lab Results  Component Value Date   INR 2.1 (H) 07/27/2019   INR 2.0 (H) 07/26/2019   INR 2.0 (H) 07/25/2019    Assessment: [name] a 84 y.o. female requires anticoagulation with warfarin for the indication of  Afib. Potential for drug drug interaction with azithromycin, will monitor closely. Warfarin will be initiated inpatient following pharmacy protocol per pharmacy consult. Patient most recent blood work is as follows:   CBC Latest Ref Rng & Units 07/26/2019 07/25/2019 05/23/2019  WBC 4.0 - 10.5 K/uL 4.5 7.5 9.6  Hemoglobin 12.0 - 15.0 g/dL 8.7(L) 8.7(L) 9.0(L)  Hematocrit 36.0 - 46.0 % 29.1(L) 29.8(L) 28.1(L)  Platelets 150 - 400 K/uL 233 242 296     Plan: Continue Warfarin 1mg  po daily  Monitor CBC qmwf with am labs   Monitor INR daily Monitor for signs and symptoms of bleeding   Margot Ables, PharmD Clinical Pharmacist 07/27/2019 10:29 AM

## 2019-07-28 DIAGNOSIS — J189 Pneumonia, unspecified organism: Secondary | ICD-10-CM

## 2019-07-28 LAB — CBC
HCT: 32.9 % — ABNORMAL LOW (ref 36.0–46.0)
Hemoglobin: 9.7 g/dL — ABNORMAL LOW (ref 12.0–15.0)
MCH: 32.1 pg (ref 26.0–34.0)
MCHC: 29.5 g/dL — ABNORMAL LOW (ref 30.0–36.0)
MCV: 108.9 fL — ABNORMAL HIGH (ref 80.0–100.0)
Platelets: 314 10*3/uL (ref 150–400)
RBC: 3.02 MIL/uL — ABNORMAL LOW (ref 3.87–5.11)
RDW: 14.7 % (ref 11.5–15.5)
WBC: 12.7 10*3/uL — ABNORMAL HIGH (ref 4.0–10.5)
nRBC: 0 % (ref 0.0–0.2)

## 2019-07-28 LAB — BASIC METABOLIC PANEL
Anion gap: 13 (ref 5–15)
BUN: 71 mg/dL — ABNORMAL HIGH (ref 8–23)
CO2: 20 mmol/L — ABNORMAL LOW (ref 22–32)
Calcium: 9.3 mg/dL (ref 8.9–10.3)
Chloride: 108 mmol/L (ref 98–111)
Creatinine, Ser: 2.04 mg/dL — ABNORMAL HIGH (ref 0.44–1.00)
GFR calc Af Amer: 26 mL/min — ABNORMAL LOW (ref >60)
GFR calc non Af Amer: 22 mL/min — ABNORMAL LOW (ref >60)
Glucose, Bld: 168 mg/dL — ABNORMAL HIGH (ref 70–99)
Potassium: 4.9 mmol/L (ref 3.5–5.1)
Sodium: 141 mmol/L (ref 135–145)

## 2019-07-28 LAB — GLUCOSE, CAPILLARY
Glucose-Capillary: 355 mg/dL — ABNORMAL HIGH (ref 70–99)
Glucose-Capillary: 188 mg/dL — ABNORMAL HIGH (ref 70–99)
Glucose-Capillary: 154 mg/dL — ABNORMAL HIGH (ref 70–99)
Glucose-Capillary: 130 mg/dL — ABNORMAL HIGH (ref 70–99)

## 2019-07-28 LAB — HEMOGLOBIN A1C
Hgb A1c MFr Bld: 5.6 % (ref 4.8–5.6)
Mean Plasma Glucose: 114.02 mg/dL

## 2019-07-28 LAB — PROTIME-INR
INR: 2.4 — ABNORMAL HIGH (ref 0.8–1.2)
Prothrombin Time: 26 seconds — ABNORMAL HIGH (ref 11.4–15.2)

## 2019-07-28 MED ORDER — INSULIN ASPART 100 UNIT/ML ~~LOC~~ SOLN
3.0000 [IU] | Freq: Three times a day (TID) | SUBCUTANEOUS | Status: DC
  Administered 2019-07-28 – 2019-08-08 (×28): 3 [IU] via SUBCUTANEOUS

## 2019-07-28 MED ORDER — METHYLPREDNISOLONE SODIUM SUCC 40 MG IJ SOLR
40.0000 mg | Freq: Three times a day (TID) | INTRAMUSCULAR | Status: DC
  Administered 2019-07-28 – 2019-07-30 (×7): 40 mg via INTRAVENOUS
  Filled 2019-07-28 (×7): qty 1

## 2019-07-28 MED ORDER — INSULIN ASPART 100 UNIT/ML ~~LOC~~ SOLN
0.0000 [IU] | Freq: Three times a day (TID) | SUBCUTANEOUS | Status: DC
  Administered 2019-07-28 (×2): 4 [IU] via SUBCUTANEOUS
  Administered 2019-07-29 (×2): 3 [IU] via SUBCUTANEOUS
  Administered 2019-07-29: 4 [IU] via SUBCUTANEOUS
  Administered 2019-07-30: 3 [IU] via SUBCUTANEOUS
  Administered 2019-07-30 (×2): 4 [IU] via SUBCUTANEOUS
  Administered 2019-07-31 – 2019-08-01 (×4): 3 [IU] via SUBCUTANEOUS
  Administered 2019-08-01: 4 [IU] via SUBCUTANEOUS
  Administered 2019-08-01 – 2019-08-02 (×2): 3 [IU] via SUBCUTANEOUS
  Administered 2019-08-02 – 2019-08-03 (×3): 4 [IU] via SUBCUTANEOUS
  Administered 2019-08-03 (×2): 3 [IU] via SUBCUTANEOUS
  Administered 2019-08-04: 4 [IU] via SUBCUTANEOUS
  Administered 2019-08-04: 3 [IU] via SUBCUTANEOUS
  Administered 2019-08-04: 7 [IU] via SUBCUTANEOUS
  Administered 2019-08-05: 4 [IU] via SUBCUTANEOUS
  Administered 2019-08-05: 3 [IU] via SUBCUTANEOUS
  Administered 2019-08-05: 4 [IU] via SUBCUTANEOUS
  Administered 2019-08-06 (×2): 3 [IU] via SUBCUTANEOUS
  Administered 2019-08-07: 7 [IU] via SUBCUTANEOUS
  Administered 2019-08-08 (×2): 3 [IU] via SUBCUTANEOUS

## 2019-07-28 MED ORDER — LIDOCAINE 5 % EX PTCH
1.0000 | MEDICATED_PATCH | CUTANEOUS | Status: DC
  Administered 2019-07-28 – 2019-08-08 (×12): 1 via TRANSDERMAL
  Filled 2019-07-28 (×12): qty 1

## 2019-07-28 MED ORDER — INSULIN ASPART 100 UNIT/ML ~~LOC~~ SOLN: 0.0000 [IU] | Freq: Every day | SUBCUTANEOUS | Status: DC

## 2019-07-28 NOTE — Telephone Encounter (Signed)
LMOVM

## 2019-07-28 NOTE — Progress Notes (Addendum)
Patient complaining of aching pain to lower back and neck, rated at a 10. Last given Norco at 0047. MD notified. New orders for Lidocaine patch. Placed Lidocaine patch and patient repositioned in the bed. Will continue to monitor.

## 2019-07-28 NOTE — Evaluation (Signed)
Physical Therapy Evaluation Patient Details Name: [name] MRN: [id number] DOB: 12-Aug-1934 Today's Date: 07/28/2019   History of Present Illness  Afreen Siebels  is a 84 y.o. female,  w hypertension, hyperlipidemia, Dm2, CKD stage 3, Anemia, h/o pacer, Pafib, CAD, mild- mod Aortic regurgitation, mild Mitral regurgitation, CHF (EF 55-60%) , h/o  Covid-19 infection, Copd, c/o increase in dyspnea for the past 5 days,  Pt has had cough w grey sputum, pt denies fever, chills, cp, palp, n/v, abd pain, diarrhea, brbpr, black stool,    Clinical Impression  Patient limited for functional mobility as stated below secondary to BLE weakness, fatigue and poor/fair standing balance. Patient moderately SOB throughout session. Her O2 sat is monitored throughout session on 8L O2 at 88% at rest which decreases to 85% with all activity which improves with pursed lip breathing. She is able to transfer and ambulate with min assist for safety and weakness and demonstrates impaired activity tolerance with quick fatigue.  Patient will benefit from continued physical therapy in hospital and recommended venue below to increase strength, balance, endurance for safe ADLs and gait.     Follow Up Recommendations SNF    Equipment Recommendations  None recommended by PT    Recommendations for Other Services       Precautions / Restrictions Precautions Precautions: Fall Restrictions Weight Bearing Restrictions: No      Mobility  Bed Mobility Overal bed mobility: Needs Assistance Bed Mobility: Supine to Sit     Supine to sit: Min guard     General bed mobility comments: slow, labored, min verbal cueing; Patient SpO2 monitored throughout session on 8L O2 88% at rest, drops to 85% with activity  Transfers Overall transfer level: Needs assistance Equipment used: Rolling walker (2 wheeled) Transfers: Sit to/from [facility] Sit to Stand: Min assist Stand pivot transfers: Min  assist       General transfer comment: patient requires min assist with RW for transfers to standing  Ambulation/Gait Ambulation/Gait assistance: Min assist Gait Distance (Feet): 3 Feet Assistive device: Rolling walker (2 wheeled) Gait Pattern/deviations: Decreased step length - right;Decreased step length - left;Decreased stride length;Shuffle;Trunk flexed Gait velocity: decreased   General Gait Details: slow, labored, with RW and min assist, limited by fatigue and SOB  Stairs            Wheelchair Mobility    Modified Rankin (Stroke Patients Only)       Balance Overall balance assessment: Needs assistance Sitting-balance support: Feet supported;No upper extremity supported Sitting balance-Leahy Scale: Fair Sitting balance - Comments: seated EOB   Standing balance support: Bilateral upper extremity supported Standing balance-Leahy Scale: Fair Standing balance comment: using RW                             Pertinent Vitals/Pain Pain Assessment: Faces Faces Pain Scale: Hurts even more Pain Location: butt Pain Intervention(s): Limited activity within patient's tolerance;Monitored during session    [place] expects to be discharged to:: Private residence Living Arrangements: Children Available Help at Discharge: Family;Available PRN/intermittently Type of Home: House Home Access: Stairs to enter Entrance Stairs-Rails: Can reach both Entrance Stairs-Number of Steps: 3 Home Layout: One level Home Equipment: Latina Craver - 2 wheels      Prior Function Level of Independence: Needs assistance   Gait / Transfers Assistance Needed: household ambulator with RW; family assists  ADL's / Homemaking Assistance Needed: family assists  Hand Dominance   Dominant Hand: Left    Extremity/Trunk Assessment   Upper Extremity Assessment Upper Extremity Assessment: Generalized weakness    Lower Extremity Assessment Lower  Extremity Assessment: Generalized weakness    Cervical / Trunk Assessment Cervical / Trunk Assessment: Kyphotic  Communication   Communication: No difficulties  Cognition Arousal/Alertness: Awake/alert Behavior During Therapy: WFL for tasks assessed/performed Overall Cognitive Status: Within Functional Limits for tasks assessed                                        General Comments      Exercises     Assessment/Plan    PT Assessment Patient needs continued PT services  PT Problem List Decreased strength;Decreased activity tolerance;Decreased balance;Decreased mobility;Decreased range of motion;Pain;Decreased skin integrity       PT Treatment Interventions DME instruction;Gait training;Stair training;Functional mobility training;Therapeutic activities;Therapeutic exercise;Balance training;Neuromuscular re-education;Patient/family education;Manual techniques;Modalities    PT Goals (Current goals can be found in the Care Plan section)  Acute Rehab PT Goals Patient Stated Goal: Return home with family PT Goal Formulation: With patient Time For Goal Achievement: 08/11/19 Potential to Achieve Goals: Fair    Frequency Min 3X/week   Barriers to discharge        Co-evaluation               AM-PAC PT "6 Clicks" Mobility  Outcome Measure Help needed turning from your back to your side while in a flat bed without using bedrails?: A Little Help needed moving from lying on your back to sitting on the side of a flat bed without using bedrails?: A Little Help needed moving to and from a bed to a chair (including a wheelchair)?: A Lot Help needed standing up from a chair using your arms (e.g., wheelchair or bedside chair)?: A Little Help needed to walk in hospital room?: A Lot Help needed climbing 3-5 steps with a railing? : A Lot 6 Click Score: 15    End of Session Equipment Utilized During Treatment: Gait belt;Oxygen(8 L) Activity Tolerance: Patient  tolerated treatment well;Patient limited by fatigue Patient left: in chair;with call bell/phone within reach;with chair alarm set Nurse Communication: Mobility status PT Visit Diagnosis: Unsteadiness on feet (R26.81);Other abnormalities of gait and mobility (R26.89);Muscle weakness (generalized) (M62.81)    Time: 5409-8119 PT Time Calculation (min) (ACUTE ONLY): 36 min   Charges:   PT Evaluation $PT Eval Moderate Complexity: 1 Mod PT Treatments $Therapeutic Activity: 23-37 mins        3:51 PM, 07/28/19 Mearl Latin PT, DPT Physical Therapist at [facility]

## 2019-07-28 NOTE — Progress Notes (Signed)
ANTICOAGULATION CONSULT NOTE -   Pharmacy Consult for warfarin dosing  Indication: atrial fibrillation   Allergies  Allergen Reactions  . Macrobid [Nitrofurantoin Monohyd Macro] Rash  . Torsemide Nausea And Vomiting  . Tramadol Nausea Only      Patient Measurements: Last Weight  Most recent update: 07/28/2019  6:12 AM   Weight  67.8 kg (149 lb 7.6 oz)           Body mass index is 27.34 kg/m. [name]               Temp: 97.7 F (36.5 C) (12/18 0447) BP: 121/69 (12/18 0447) Pulse Rate: 93 (12/18 0447)  Labs: Recent Labs    07/25/19 1555 07/26/19 0520 07/27/19 0521 07/28/19 0532  HGB 8.7* 8.7*  --  9.7*  HCT 29.8* 29.1*  --  32.9*  PLT 242 233  --  314  LABPROT 22.1* 22.7* 23.5* 26.0*  INR 2.0* 2.0* 2.1* 2.4*  CREATININE 1.61* 1.62*  --  2.04*    Estimated Creatinine Clearance: 19.2 mL/min (A) (by C-G formula based on SCr of 2.04 mg/dL (H)).  Goal of Therapy:  INR 2-3 Monitor CBC     Prior to Admission Warfarin Dosing:  [name] takes 1mg  of warfarin daily    Admit INR was 2.0 Lab Results  Component Value Date   INR 2.4 (H) 07/28/2019   INR 2.1 (H) 07/27/2019   INR 2.0 (H) 07/26/2019    Assessment: [name] a 84 y.o. female requires anticoagulation with warfarin for the indication of  Afib. Potential for drug drug interaction with azithromycin, will monitor closely. Warfarin will be initiated inpatient following pharmacy protocol per pharmacy consult. Patient most recent blood work is as follows:   CBC Latest Ref Rng & Units 07/28/2019 07/26/2019 07/25/2019  WBC 4.0 - 10.5 K/uL 12.7(H) 4.5 7.5  Hemoglobin 12.0 - 15.0 g/dL 9.7(L) 8.7(L) 8.7(L)  Hematocrit 36.0 - 46.0 % 32.9(L) 29.1(L) 29.8(L)  Platelets 150 - 400 K/uL 314 233 242     Plan: Continue Warfarin 1mg  po daily  Monitor CBC qmwf with am labs   Monitor INR daily Monitor for signs and symptoms of bleeding   Margot Ables, PharmD Clinical  Pharmacist 07/28/2019 10:39 AM

## 2019-07-28 NOTE — Plan of Care (Signed)
  Problem: Acute Rehab PT Goals(only PT should resolve) Goal: Pt Will Go Supine/Side To Sit Outcome: Progressing Flowsheets (Taken 07/28/2019 1553) Pt will go Supine/Side to Sit: with modified independence Goal: Patient Will Transfer Sit To/From Stand Outcome: Progressing Flowsheets (Taken 07/28/2019 1553) Patient will transfer sit to/from stand: with supervision Goal: Pt Will Transfer Bed To Chair/Chair To Bed Outcome: Progressing Flowsheets (Taken 07/28/2019 1553) Pt will Transfer Bed to Chair/Chair to Bed: min guard assist Goal: Pt Will Ambulate Outcome: Progressing Flowsheets (Taken 07/28/2019 1553) Pt will Ambulate:  25 feet  with min guard assist  3:54 PM, 07/28/19 Mearl Latin PT, DPT Physical Therapist at [facility]

## 2019-07-28 NOTE — TOC Initial Note (Signed)
Transition of Care [facility]) - Initial/Assessment Note    Patient Details  Name: [name] MRN: [id number] Date of Birth: 03-25-1935  Transition of Care [facility]) CM/SW Contact:    Boneta Lucks, RN Phone Number: 07/28/2019, 10:26 AM  Clinical Narrative:        Patient admitted for pneumonia. Patient is high risk for readmission. Patient currently on 8L, spoke with daughter Hilda Blades.  Patient lives at home with Hilda Blades and her daughter, the patient son is also available to assist any time they need.  Patient walks with a walker and on 2/3 L home oxygen with [employer]. She was active with [facility] in October. TOC will follow for needs.            Expected Discharge Plan: [place] Barriers to Discharge: Continued Medical Work up   Patient Goals and CMS Choice Patient states their goals for this hospitalization and ongoing recovery are:: to go back home with family.  Expected Discharge Plan and Services Expected Discharge Plan: [place]   Prior Living Arrangements/Services   Lives with:: Adult Children          Need for Family Participation in Patient Care: Yes (Comment) Care giver support system in place?: Yes (comment) Current home services: DME Criminal Activity/Legal Involvement Pertinent to Current Situation/Hospitalization: No - Comment as needed  Activities of Daily Living Home Assistive Devices/Equipment: Walker (specify type), CBG Meter ADL Screening (condition at time of admission) Patient's cognitive ability adequate to safely complete daily activities?: Yes Is the patient deaf or have difficulty hearing?: No Does the patient have difficulty seeing, even when wearing glasses/contacts?: No Does the patient have difficulty concentrating, remembering, or making decisions?: No Patient able to express need for assistance with ADLs?: Yes Does the patient have difficulty dressing or bathing?: No Independently performs ADLs?: Yes  (appropriate for developmental age) Does the patient have difficulty walking or climbing stairs?: Yes Weakness of Legs: Both Weakness of Arms/Hands: None  Permission Sought/Granted      Share Information with NAME: Debra       Emotional Assessment      Alcohol / Substance Use: Other (comment) Psych Involvement: No (comment)  Admission diagnosis:  Pneumonia [J18.9] Patient Active Problem List   Diagnosis Date Noted  . Pneumonia due to COVID-19 virus 05/10/2019  . Elevated troponin   . Pneumonia 05/09/2019  . Leg wound, left, sequela 05/09/2019  . Chronic respiratory failure with hypoxia ([place]) 05/09/2019  . COPD exacerbation ([place]) 04/26/2019  . Hypokalemia 04/26/2019  . Supratherapeutic INR 04/26/2019  . At high risk for falls 02/13/2016  . CKD (chronic kidney disease), stage III ([place]) 07/02/2015  . Hyperlipidemia with target LDL less than 100 12/31/2014  . Osteoporosis, post-menopausal 11/01/2013  . GERD (gastroesophageal reflux disease) 07/13/2013  . Aortic stenosis 06/27/2013  . COPD with acute exacerbation ([place]) 06/27/2013  . Acute on chronic diastolic CHF (congestive heart failure) ([place]) 06/27/2013  . Vitamin D deficiency   . Tobacco abuse 05/12/2012  . Abdominal wall hernia 10/19/2011  . Unspecified atrial fibrillation ([place])   . CAD (coronary artery disease)   . Pacemaker-St.Jude   . Chronic anticoagulation 12/03/2010  . Anxiety 12/03/2010  . Mitral stenosis 05/30/2010  . Macrocytic anemia 05/16/2010  . Type 2 diabetes mellitus with kidney complication, without long-term current use of insulin ([place]) 01/19/2009  . Essential hypertension 01/19/2009  . COPD GOLD O  01/19/2009  . HEART MURMUR, BENIGN 01/19/2009   PCP:  Hassell Done,  Mary-Margaret, FNP Pharmacy:   [facility], [address] [place] [address] Phone: [phone] Fax: [phone]  [facility], [address] [place] [address] Phone: [phone] Fax: [place], [place] [address] [place] [address] Phone: [phone] Fax: [phone]   Readmission Risk Interventions Readmission Risk Prevention Plan 07/28/2019 05/14/2019 04/27/2019  Transportation Screening Complete Complete -  PCP or Specialist Appt within 3-5 Days - Complete Complete  HRI or Home Care Consult - Complete Complete  Social Work Consult for [place] Planning/Counseling - Complete -  [place] - Not Applicable -  Medication Review Press photographer) Complete Complete -  PCP or Specialist appointment within 3-5 days of discharge Not Complete - -  [place] or Home Care Consult Complete - -  Palliative Care Screening Not Complete - -  [place] Not Complete - -  Some recent data might be hidden

## 2019-07-28 NOTE — Progress Notes (Signed)
PROGRESS NOTE    [name]  RWE:[id number] DOB: [birth year] DOA: 07/25/2019 PCP: [name], FNP   Brief Narrative:  As per H&P written by Dr. [name] on 07/25/2019 84 y.o.female,w hypertension, hyperlipidemia, Dm2, CKD stage 3, Anemia, h/o pacer, Pafib, CAD, mild- mod Aortic regurgitation, mild Mitral regurgitation, CHF (EF 55-60%) , h/o Covid-19 infection, Copd, c/o increase in dyspnea for the past 5 days, Pt has had cough w grey sputum, pt denies fever, chills, cp, palp, n/v, abd pain, diarrhea, brbpr, black stool,   In ED,  T 98.5, P 64, R 16, Bp 119/66 pox 95%on 4L [place] WT 46.3  CXR IMPRESSION: 1. Low lung volumes with patchy mixed interstitial and airspace opacity with a basilar predominance. Findings are suspicious for multifocal pneumonia or aspiration though mixed interstitial and alveolar edema could have a similar appearance 2. Small right pleural effusion. 3. Stable cardiomegaly.  Na 142, K 3.7, Bun 34, creatinine 1.61 ASt 24, Alt 14 Wbc 7.5, Hgb 8.7, Plt 242 Trop 20 -> 18 BNP 790   COVID test negative  12/18: Patient continues to have significant hypoxemia and remains on 8 L nasal cannula.  She also appears quite weak.  We will plan to wean oxygen today and attempt a voiding trial with discontinuation of Foley catheter.  PT evaluation will be necessary prior to discharge.  Continue on Rocephin and azithromycin for multifocal pneumonia.  Follow a.m. labs.  Assessment & Plan:   Principal Problem:   Pneumonia Active Problems:   Type 2 diabetes mellitus with kidney complication, without long-term current use of insulin (HCC)   Essential hypertension   COPD GOLD O    COPD with acute exacerbation (HCC)   CKD (chronic kidney disease), stage III (HCC)   1-acute on chronic respiratory failure in the setting of multifocal pneumonia, COPD exacerbation and CHF exacerbation. -Patient heart failure exacerbation is acute on chronic diastolic heart  failure. -Continue daily weights, low-sodium diet, IV Lasix. -Follow strict I's and O's. -For her COPD will continue the use of IV steroids which will be weaned to 40 mg 3 times daily, start patient on Brovana, Pulmicort and as needed bronchodilators. -Patient also started on flutter valve and Mucinex. -Continue antibiotics for multifocal pneumonia component.   -Wean off oxygen supplementation as tolerated (patient at baseline using 2-3 L).  2-chronic atrial fibrillation -Rate control currently -Continue Coumadin per pharmacy -Continue Cardizem -Monitoring on telemetry.  3-Type 2 diabetes mellitus with kidney complication, without long-term current use of insulin (HCC) -With noted steroid-induced hyperglycemia. -While inpatient will hold oral hypoglycemic agents -Continue sliding scale insulin and Lantus with doses that have been increased along with mealtime insulins that have been added.  4-Essential hypertension -Stable overall -Continue current antihypertensive regimen.  5-chronic kidney disease is stage III b. -Appears to be stable -Close monitoring of electrolytes and renal function in the setting of acute IV diuresis.  6-gastroesophageal reflux disease -Continue PPI and Carafate.  7-history of chronic pancreatitis -Continue Creon -Patient reports no abdominal pain, nausea or vomiting currently.  8-hyperlipidemia -Continue statins  9-urinary retention -Bladder scan demonstrated over 800 mL retained -Foley catheter has been placed -We will trial voiding today  DVT prophylaxis: Chronically on Coumadin Code Status: Full code Family Communication: No family at bedside. Disposition Plan: Remains inpatient, continue treatment for CHF and COPD exacerbation; negative Covid 19 test; safe to use nebulizer management.  Foley catheter placed secondary to urinary retention and will try voiding trial today.  Continue monitoring Benicar response.  PT evaluation  for  possible need for placement.  Wean oxygen as tolerated.  Consultants:   None  Procedures:   See below for x-ray reports  Antimicrobials:  Anti-infectives (From admission, onward)   Start     Dose/Rate Route Frequency Ordered Stop   07/25/19 1830  cefTRIAXone (ROCEPHIN) 2 g in sodium chloride 0.9 % 100 mL IVPB     2 g 200 mL/hr over 30 Minutes Intravenous Every 24 hours 07/25/19 1826 07/30/19 1829   07/25/19 1830  azithromycin (ZITHROMAX) 500 mg in sodium chloride 0.9 % 250 mL IVPB     500 mg 250 mL/hr over 60 Minutes Intravenous Every 24 hours 07/25/19 1826 07/30/19 1829       Subjective: Patient seen and evaluated today with no new acute complaints or concerns. No acute concerns or events noted overnight.  She continues to remain on 8 L nasal cannula oxygen and remains weak.  She is complaining of some bilateral hip pain as well.  Objective: Vitals:   07/28/19 0447 07/28/19 0553 07/28/19 0612 07/28/19 0745  BP: 121/69     Pulse: 93     Resp: 20     Temp: 97.7 F (36.5 C)     TempSrc:      SpO2: 96%   94%  Weight:  67.5 kg 67.8 kg   Height:        Intake/Output Summary (Last 24 hours) at 07/28/2019 1227 Last data filed at 07/28/2019 0600 Gross per 24 hour  Intake 490 ml  Output 1575 ml  Net -1085 ml   Filed Weights   07/26/19 1024 07/28/19 0553 07/28/19 0612  Weight: 46.3 kg 67.5 kg 67.8 kg    Examination:  General exam: Appears calm and comfortable, appears frail Respiratory system: Clear to auscultation. Respiratory effort normal.  Currently on 8 L nasal cannula oxygen. Cardiovascular system: S1 & S2 heard, RRR. No JVD, murmurs, rubs, gallops or clicks. No pedal edema. Gastrointestinal system: Abdomen is nondistended, soft and nontender. No organomegaly or masses felt. Normal bowel sounds heard. Central nervous system: Alert and oriented. No focal neurological deficits. Extremities: Symmetric 5 x 5 power. Skin: No rashes, lesions or ulcers Psychiatry:  Judgement and insight appear normal. Mood & affect appropriate.     Data Reviewed: I have personally reviewed following labs and imaging studies  CBC: Recent Labs  Lab 07/25/19 1555 07/26/19 0520 07/28/19 0532  WBC 7.5 4.5 12.7*  NEUTROABS 5.7  --   --   HGB 8.7* 8.7* 9.7*  HCT 29.8* 29.1* 32.9*  MCV 112.0* 109.8* 108.9*  PLT 242 233 347   Basic Metabolic Panel: Recent Labs  Lab 07/25/19 1555 07/26/19 0520 07/28/19 0532  NA 142 142 141  K 3.7 4.8 4.9  CL 104 106 108  CO2 25 25 20*  GLUCOSE 137* 184* 168*  BUN 34* 39* 71*  CREATININE 1.61* 1.62* 2.04*  CALCIUM 9.6 9.3 9.3   GFR: Estimated Creatinine Clearance: 19.2 mL/min (A) (by C-G formula based on SCr of 2.04 mg/dL (H)). Liver Function Tests: Recent Labs  Lab 07/25/19 1555 07/26/19 0520  AST 24 22  ALT 14 15  ALKPHOS 90 83  BILITOT 0.5 0.6  PROT 7.1 6.6  ALBUMIN 3.2* 2.8*   No results for input(s): LIPASE, AMYLASE in the last 168 hours. No results for input(s): AMMONIA in the last 168 hours. Coagulation Profile: Recent Labs  Lab 07/25/19 1555 07/26/19 0520 07/27/19 0521 07/28/19 0532  INR 2.0* 2.0* 2.1* 2.4*   Cardiac Enzymes: No  results for input(s): CKTOTAL, CKMB, CKMBINDEX, TROPONINI in the last 168 hours. BNP (last 3 results) No results for input(s): PROBNP in the last 8760 hours. HbA1C: Recent Labs    07/28/19 0823  HGBA1C 5.6   CBG: Recent Labs  Lab 07/27/19 1152 07/27/19 1633 07/27/19 2209 07/28/19 0746 07/28/19 1155  GLUCAP 217* 157* 166* 355* 188*   Lipid Profile: No results for input(s): CHOL, HDL, LDLCALC, TRIG, CHOLHDL, LDLDIRECT in the last 72 hours. Thyroid Function Tests: No results for input(s): TSH, T4TOTAL, FREET4, T3FREE, THYROIDAB in the last 72 hours. Anemia Panel: No results for input(s): VITAMINB12, FOLATE, FERRITIN, TIBC, IRON, RETICCTPCT in the last 72 hours. Sepsis Labs: No results for input(s): PROCALCITON, LATICACIDVEN in the last 168 hours.  Recent  Results (from the past 240 hour(s))  Culture, blood (Routine X 2) w Reflex to ID Panel     Status: None (Preliminary result)   Collection Time: 07/25/19  5:54 PM   Specimen: Right Antecubital; Blood  Result Value Ref Range Status   Specimen Description RIGHT ANTECUBITAL  Final   Special Requests   Final    BOTTLES DRAWN AEROBIC AND ANAEROBIC Blood Culture adequate volume   Culture   Final    NO GROWTH 3 DAYS Performed at [facility], [address] [place], [place] [address]    Report Status PENDING  Incomplete  Culture, blood (Routine X 2) w Reflex to ID Panel     Status: None (Preliminary result)   Collection Time: 07/25/19  7:09 PM   Specimen: BLOOD LEFT WRIST  Result Value Ref Range Status   Specimen Description BLOOD LEFT WRIST  Final   Special Requests   Final    BOTTLES DRAWN AEROBIC AND ANAEROBIC Blood Culture adequate volume   Culture   Final    NO GROWTH 3 DAYS Performed at [facility], [address], [place], [place] [address]    Report Status PENDING  Incomplete  SARS CORONAVIRUS 2 (TAT 6-24 HRS) Nasopharyngeal Nasopharyngeal Swab     Status: None   Collection Time: 07/25/19  7:13 PM   Specimen: Nasopharyngeal Swab  Result Value Ref Range Status   SARS Coronavirus 2 NEGATIVE NEGATIVE Final    Comment: (NOTE) SARS-CoV-2 target nucleic acids are NOT DETECTED. The SARS-CoV-2 RNA is generally detectable in upper and lower respiratory specimens during the acute phase of infection. Negative results do not preclude SARS-CoV-2 infection, do not rule out co-infections with other pathogens, and should not be used as the sole basis for treatment or other patient management decisions. Negative results must be combined with clinical observations, patient history, and epidemiological information. The expected result is Negative. Fact Sheet for Patients: SugarRoll.be Fact Sheet for Healthcare  Providers: https://www.woods-mathews.com/ This test is not yet approved or cleared by the [place] FDA and  has been authorized for detection and/or diagnosis of SARS-CoV-2 by FDA under an Emergency Use Authorization (EUA). This EUA will remain  in effect (meaning this test can be used) for the duration of the COVID-19 declaration under Section 56 4(b)(1) of the Act, 21 U.S.C. section 360bbb-3(b)(1), unless the authorization is terminated or revoked sooner. Performed at [facility], [place] [address] [place], [place] [address]          Radiology Studies: No results found.      Scheduled Meds:  arformoterol  15 mcg Nebulization BID   budesonide (PULMICORT) nebulizer solution  0.5 mg Nebulization BID   Chlorhexidine Gluconate Cloth  6 each Topical Q0600  dextromethorphan-guaiFENesin  1 tablet Oral BID   diltiazem  120 mg Oral Daily   feeding supplement (PRO-STAT SUGAR FREE 64)  30 mL Oral BID   ferrous sulfate  325 mg Oral Q breakfast   insulin aspart  0-20 Units Subcutaneous TID WC   insulin aspart  0-5 Units Subcutaneous QHS   insulin aspart  3 Units Subcutaneous TID WC   insulin glargine  7 Units Subcutaneous QHS   lidocaine  1 patch Transdermal Q24H   lipase/protease/amylase  36,000 Units Oral TID WC   methylPREDNISolone (SOLU-MEDROL) injection  40 mg Intravenous Q8H   multivitamin-lutein  1 capsule Oral Daily   pantoprazole  40 mg Oral BID   potassium chloride SA  20 mEq Oral Daily   simvastatin  10 mg Oral Daily   sucralfate  1 g Oral TID WC & HS   warfarin  1 mg Oral q1800   Warfarin - Pharmacist Dosing Inpatient   Does not apply q1800   Continuous Infusions:  azithromycin 500 mg (07/27/19 2219)   cefTRIAXone (ROCEPHIN)  IV 2 g (07/27/19 1728)     LOS: 3 days    Time spent: 30 minutes    Jiselle Sheu Darleen Crocker, DO [facility] Pager [phone]  If 7PM-7AM, please contact  night-coverage [email or web address] Password [facility] 07/28/2019, 12:27 PM

## 2019-07-29 LAB — CBC
HCT: 32.3 % — ABNORMAL LOW (ref 36.0–46.0)
Hemoglobin: 9.7 g/dL — ABNORMAL LOW (ref 12.0–15.0)
MCH: 32.8 pg (ref 26.0–34.0)
MCHC: 30 g/dL (ref 30.0–36.0)
MCV: 109.1 fL — ABNORMAL HIGH (ref 80.0–100.0)
Platelets: 274 10*3/uL (ref 150–400)
RBC: 2.96 MIL/uL — ABNORMAL LOW (ref 3.87–5.11)
RDW: 14.7 % (ref 11.5–15.5)
WBC: 9.2 10*3/uL (ref 4.0–10.5)
nRBC: 0 % (ref 0.0–0.2)

## 2019-07-29 LAB — BASIC METABOLIC PANEL
Anion gap: 6 (ref 5–15)
BUN: 72 mg/dL — ABNORMAL HIGH (ref 8–23)
CO2: 26 mmol/L (ref 22–32)
Calcium: 9.2 mg/dL (ref 8.9–10.3)
Chloride: 109 mmol/L (ref 98–111)
Creatinine, Ser: 1.61 mg/dL — ABNORMAL HIGH (ref 0.44–1.00)
GFR calc Af Amer: 34 mL/min — ABNORMAL LOW (ref >60)
GFR calc non Af Amer: 29 mL/min — ABNORMAL LOW (ref >60)
Glucose, Bld: 163 mg/dL — ABNORMAL HIGH (ref 70–99)
Potassium: 4.5 mmol/L (ref 3.5–5.1)
Sodium: 141 mmol/L (ref 135–145)

## 2019-07-29 LAB — GLUCOSE, CAPILLARY
Glucose-Capillary: 143 mg/dL — ABNORMAL HIGH (ref 70–99)
Glucose-Capillary: 125 mg/dL — ABNORMAL HIGH (ref 70–99)
Glucose-Capillary: 157 mg/dL — ABNORMAL HIGH (ref 70–99)
Glucose-Capillary: 137 mg/dL — ABNORMAL HIGH (ref 70–99)

## 2019-07-29 LAB — PROTIME-INR
INR: 2.5 — ABNORMAL HIGH (ref 0.8–1.2)
Prothrombin Time: 27 seconds — ABNORMAL HIGH (ref 11.4–15.2)

## 2019-07-29 MED ORDER — ALUM & MAG HYDROXIDE-SIMETH 200-200-20 MG/5ML PO SUSP
30.0000 mL | ORAL | Status: DC | PRN
  Administered 2019-07-29 – 2019-08-05 (×2): 30 mL via ORAL
  Filled 2019-07-29 (×3): qty 30

## 2019-07-29 NOTE — Progress Notes (Signed)
PROGRESS NOTE    [name]  HDQ:[id number] DOB: Mar 08, 1935 DOA: 07/25/2019 PCP: [name], FNP   Brief Narrative:  As per H&P written by Dr. [name] on 07/25/2019 84 y.o.female,w hypertension, hyperlipidemia, Dm2, CKD stage 3, Anemia, h/o pacer, Pafib, CAD, mild- mod Aortic regurgitation, mild Mitral regurgitation, CHF (EF 55-60%) , h/o Covid-19 infection, Copd, c/o increase in dyspnea for the past 5 days, Pt has had cough w grey sputum, pt denies fever, chills, cp, palp, n/v, abd pain, diarrhea, brbpr, black stool,   In ED,  T 98.5, P 64, R 16, Bp 119/66 pox 95%on 4L [place] WT 46.3  CXR IMPRESSION: 1. Low lung volumes with patchy mixed interstitial and airspace opacity with a basilar predominance. Findings are suspicious for multifocal pneumonia or aspiration though mixed interstitial and alveolar edema could have a similar appearance 2. Small right pleural effusion. 3. Stable cardiomegaly.  Na 142, K 3.7, Bun 34, creatinine 1.61 ASt 24, Alt 14 Wbc 7.5, Hgb 8.7, Plt 242 Trop 20 -> 18 BNP 790   COVID test negative  12/18: Patient continues to have significant hypoxemia and remains on 8 L nasal cannula.  She also appears quite weak.  We will plan to wean oxygen today and attempt a voiding trial with discontinuation of Foley catheter.  PT evaluation will be necessary prior to discharge.  Continue on Rocephin and azithromycin for multifocal pneumonia.  Follow a.m. labs.  12/19: Patient is currently on 5 L nasal cannula oxygen.  She still appears quite weak.  PT recommending SNF on discharge.  Continuing azithromycin and Rocephin for pneumonia.  Assessment & Plan:   Principal Problem:   Pneumonia Active Problems:   Type 2 diabetes mellitus with kidney complication, without long-term current use of insulin (HCC)   Essential hypertension   COPD GOLD O    COPD with acute exacerbation (HCC)   CKD (chronic kidney disease), stage III (HCC)   1-acute on  chronic respiratory failure in the setting of multifocal pneumonia, COPD exacerbation and CHF exacerbation-improving -Patient heart failure exacerbation is acute on chronic diastolic heart failure. -Continue daily weights and low-sodium diet -Follow strict I's and O's. -For her COPD will continue the use of IV steroids which will be weaned to 40 mg 3 times daily, start patient on Brovana, Pulmicort and as needed bronchodilators. -Patient also started on flutter valve and Mucinex. -Continue antibiotics for multifocal pneumonia component.  -Wean off oxygen supplementation as tolerated (patient at baseline using 2-3 L).  Currently on 5 L nasal cannula.  2-chronic atrial fibrillation -Rate control currently -Continue Coumadin per pharmacy -Continue Cardizem -Monitoring on telemetry.  3-Type 2 diabetes mellitus with kidney complication, without long-term current use of insulin (HCC) -With noted steroid-induced hyperglycemia. -While inpatient will hold oral hypoglycemic agents -Continue sliding scale insulin and Lantus with doses that have been increased along with mealtime insulins that have been added.  4-Essential hypertension -Stable overall -Continue current antihypertensive regimen.  5-chronic kidney disease is stage III b. -Appears to be stable -Close monitoring of electrolytes and renal function in the setting of acute IV diuresis.  6-gastroesophageal reflux disease -Continue PPI and Carafate.  7-history of chronic pancreatitis -Continue Creon -Patient reports no abdominal pain, nausea or vomiting currently.  8-hyperlipidemia -Continue statins  9-urinary retention -Bladder scan demonstrated over 800 mL retained -Foley catheter removed on 12/18, but failed voiding trial and will replace today  DVT prophylaxis:Chronically on Coumadin Code Status:Full code Family Communication:No family at bedside. Disposition Plan:Remains inpatient, continue treatment for  CHF and COPD exacerbation; negative Covid 19 test; safe to use nebulizer management. Foley catheter to be replaced today. Continue monitoring Benicar response.  PT evaluation indicating need for SNF. Wean oxygen as tolerated.  Consultants:  None  Procedures:  See below for x-ray reports  Antimicrobials:  Anti-infectives (From admission, onward)   Start     Dose/Rate Route Frequency Ordered Stop   07/25/19 1830  cefTRIAXone (ROCEPHIN) 2 g in sodium chloride 0.9 % 100 mL IVPB     2 g 200 mL/hr over 30 Minutes Intravenous Every 24 hours 07/25/19 1826 07/30/19 1829   07/25/19 1830  azithromycin (ZITHROMAX) 500 mg in sodium chloride 0.9 % 250 mL IVPB     500 mg 250 mL/hr over 60 Minutes Intravenous Every 24 hours 07/25/19 1826 07/30/19 1829        Subjective: Patient seen and evaluated today with no new acute complaints or concerns. No acute concerns or events noted overnight.  She is overall feeling weak and not well.  Remains on 5 L nasal cannula oxygen.  Objective: Vitals:   07/29/19 0500 07/29/19 0600 07/29/19 0844 07/29/19 0847  BP:  (!) 147/85    Pulse:  96    Resp:  19    Temp:  97.9 F (36.6 C)    TempSrc:  Oral    SpO2:  91% 98% 100%  Weight: 67.9 kg     Height:        Intake/Output Summary (Last 24 hours) at 07/29/2019 1235 Last data filed at 07/29/2019 0900 Gross per 24 hour  Intake 805 ml  Output 600 ml  Net 205 ml   Filed Weights   07/28/19 0553 07/28/19 0612 07/29/19 0500  Weight: 67.5 kg 67.8 kg 67.9 kg    Examination:  General exam: Appears calm and comfortable, thin/cachectic Respiratory system: Clear to auscultation. Respiratory effort normal.  Currently on 5 L nasal cannula oxygen. Cardiovascular system: S1 & S2 heard, RRR. No JVD, murmurs, rubs, gallops or clicks. No pedal edema. Gastrointestinal system: Abdomen is nondistended, soft and nontender. No organomegaly or masses felt. Normal bowel sounds heard. Central nervous system: Alert  and oriented. No focal neurological deficits. Extremities: Symmetric 5 x 5 power. Skin: No rashes, lesions or ulcers Psychiatry: Judgement and insight appear normal. Mood & affect appropriate.     Data Reviewed: I have personally reviewed following labs and imaging studies  CBC: Recent Labs  Lab 07/25/19 1555 07/26/19 0520 07/28/19 0532 07/29/19 0706  WBC 7.5 4.5 12.7* 9.2  NEUTROABS 5.7  --   --   --   HGB 8.7* 8.7* 9.7* 9.7*  HCT 29.8* 29.1* 32.9* 32.3*  MCV 112.0* 109.8* 108.9* 109.1*  PLT 242 233 314 130   Basic Metabolic Panel: Recent Labs  Lab 07/25/19 1555 07/26/19 0520 07/28/19 0532 07/29/19 0706  NA 142 142 141 141  K 3.7 4.8 4.9 4.5  CL 104 106 108 109  CO2 25 25 20* 26  GLUCOSE 137* 184* 168* 163*  BUN 34* 39* 71* 72*  CREATININE 1.61* 1.62* 2.04* 1.61*  CALCIUM 9.6 9.3 9.3 9.2   GFR: Estimated Creatinine Clearance: 24.3 mL/min (A) (by C-G formula based on SCr of 1.61 mg/dL (H)). Liver Function Tests: Recent Labs  Lab 07/25/19 1555 07/26/19 0520  AST 24 22  ALT 14 15  ALKPHOS 90 83  BILITOT 0.5 0.6  PROT 7.1 6.6  ALBUMIN 3.2* 2.8*   No results for input(s): LIPASE, AMYLASE in the last 168 hours. No results  for input(s): AMMONIA in the last 168 hours. Coagulation Profile: Recent Labs  Lab 07/25/19 1555 07/26/19 0520 07/27/19 0521 07/28/19 0532 07/29/19 0706  INR 2.0* 2.0* 2.1* 2.4* 2.5*   Cardiac Enzymes: No results for input(s): CKTOTAL, CKMB, CKMBINDEX, TROPONINI in the last 168 hours. BNP (last 3 results) No results for input(s): PROBNP in the last 8760 hours. HbA1C: Recent Labs    07/28/19 0823  HGBA1C 5.6   CBG: Recent Labs  Lab 07/28/19 1155 07/28/19 1617 07/28/19 2215 07/29/19 0738 07/29/19 1120  GLUCAP 188* 154* 130* 143* 125*   Lipid Profile: No results for input(s): CHOL, HDL, LDLCALC, TRIG, CHOLHDL, LDLDIRECT in the last 72 hours. Thyroid Function Tests: No results for input(s): TSH, T4TOTAL, FREET4, T3FREE,  THYROIDAB in the last 72 hours. Anemia Panel: No results for input(s): VITAMINB12, FOLATE, FERRITIN, TIBC, IRON, RETICCTPCT in the last 72 hours. Sepsis Labs: No results for input(s): PROCALCITON, LATICACIDVEN in the last 168 hours.  Recent Results (from the past 240 hour(s))  Culture, blood (Routine X 2) w Reflex to ID Panel     Status: None (Preliminary result)   Collection Time: 07/25/19  5:54 PM   Specimen: Right Antecubital; Blood  Result Value Ref Range Status   Specimen Description RIGHT ANTECUBITAL  Final   Special Requests   Final    BOTTLES DRAWN AEROBIC AND ANAEROBIC Blood Culture adequate volume   Culture   Final    NO GROWTH 4 DAYS Performed at [facility], [address], [place], [place] [address]    Report Status PENDING  Incomplete  Culture, blood (Routine X 2) w Reflex to ID Panel     Status: None (Preliminary result)   Collection Time: 07/25/19  7:09 PM   Specimen: BLOOD LEFT WRIST  Result Value Ref Range Status   Specimen Description BLOOD LEFT WRIST  Final   Special Requests   Final    BOTTLES DRAWN AEROBIC AND ANAEROBIC Blood Culture adequate volume   Culture   Final    NO GROWTH 4 DAYS Performed at [facility], [address], [place], [place] [address]    Report Status PENDING  Incomplete  SARS CORONAVIRUS 2 (TAT 6-24 HRS) Nasopharyngeal Nasopharyngeal Swab     Status: None   Collection Time: 07/25/19  7:13 PM   Specimen: Nasopharyngeal Swab  Result Value Ref Range Status   SARS Coronavirus 2 NEGATIVE NEGATIVE Final    Comment: (NOTE) SARS-CoV-2 target nucleic acids are NOT DETECTED. The SARS-CoV-2 RNA is generally detectable in upper and lower respiratory specimens during the acute phase of infection. Negative results do not preclude SARS-CoV-2 infection, do not rule out co-infections with other pathogens, and should not be used as the sole basis for treatment or other patient management decisions. Negative results must be combined with clinical  observations, patient history, and epidemiological information. The expected result is Negative. Fact Sheet for Patients: SugarRoll.be Fact Sheet for Healthcare Providers: https://www.woods-mathews.com/ This test is not yet approved or cleared by the [place] FDA and  has been authorized for detection and/or diagnosis of SARS-CoV-2 by FDA under an Emergency Use Authorization (EUA). This EUA will remain  in effect (meaning this test can be used) for the duration of the COVID-19 declaration under Section 56 4(b)(1) of the Act, 21 U.S.C. section 360bbb-3(b)(1), unless the authorization is terminated or revoked sooner. Performed at [facility], [place] [address], [place],  [address]          Radiology Studies: No results  found.      Scheduled Meds: . arformoterol  15 mcg Nebulization BID  . budesonide (PULMICORT) nebulizer solution  0.5 mg Nebulization BID  . dextromethorphan-guaiFENesin  1 tablet Oral BID  . diltiazem  120 mg Oral Daily  . feeding supplement (PRO-STAT SUGAR FREE 64)  30 mL Oral BID  . ferrous sulfate  325 mg Oral Q breakfast  . insulin aspart  0-20 Units Subcutaneous TID WC  . insulin aspart  0-5 Units Subcutaneous QHS  . insulin aspart  3 Units Subcutaneous TID WC  . insulin glargine  7 Units Subcutaneous QHS  . lidocaine  1 patch Transdermal Q24H  . lipase/protease/amylase  36,000 Units Oral TID WC  . methylPREDNISolone (SOLU-MEDROL) injection  40 mg Intravenous Q8H  . multivitamin-lutein  1 capsule Oral Daily  . pantoprazole  40 mg Oral BID  . potassium chloride SA  20 mEq Oral Daily  . simvastatin  10 mg Oral Daily  . sucralfate  1 g Oral TID WC & HS  . warfarin  1 mg Oral q1800  . Warfarin - Pharmacist Dosing Inpatient   Does not apply q1800   Continuous Infusions: . azithromycin 500 mg (07/28/19 1830)  . cefTRIAXone (ROCEPHIN)  IV 2 g (07/28/19 1756)     LOS: 4 days    Time spent:  30 minutes    Sharvi Mooneyhan Darleen Crocker, DO [facility] Pager [phone]  If 7PM-7AM, please contact night-coverage [email or web address] Password [facility] 07/29/2019, 12:35 PM

## 2019-07-29 NOTE — Progress Notes (Signed)
ANTICOAGULATION CONSULT NOTE -   Pharmacy Consult for warfarin dosing  Indication: atrial fibrillation   Allergies  Allergen Reactions  . Macrobid [Nitrofurantoin Monohyd Macro] Rash  . Torsemide Nausea And Vomiting  . Tramadol Nausea Only      Patient Measurements: Last Weight  Most recent update: 07/29/2019  6:38 AM   Weight  67.9 kg (149 lb 11.1 oz)           Body mass index is 27.38 kg/m. [name]               Temp: 97.9 F (36.6 C) (12/19 0600) Temp Source: Oral (12/19 0600) BP: 147/85 (12/19 0600) Pulse Rate: 96 (12/19 0600)  Labs: Recent Labs    07/27/19 0521 07/28/19 0532 07/29/19 0706  HGB  --  9.7* 9.7*  HCT  --  32.9* 32.3*  PLT  --  314 274  LABPROT 23.5* 26.0* 27.0*  INR 2.1* 2.4* 2.5*  CREATININE  --  2.04* 1.61*    Estimated Creatinine Clearance: 24.3 mL/min (A) (by C-G formula based on SCr of 1.61 mg/dL (H)).  Goal of Therapy:  INR 2-3 Monitor CBC     Prior to Admission Warfarin Dosing:  [name] takes 1mg  of warfarin daily    Admit INR was 2.0 Lab Results  Component Value Date   INR 2.5 (H) 07/29/2019   INR 2.4 (H) 07/28/2019   INR 2.1 (H) 07/27/2019    Assessment: [name] a 84 y.o. female requires anticoagulation with warfarin for the indication of  Afib. Potential for drug drug interaction with azithromycin, will monitor closely. Warfarin will be initiated inpatient following pharmacy protocol per pharmacy consult. Patient most recent blood work is as follows:   CBC Latest Ref Rng & Units 07/29/2019 07/28/2019 07/26/2019  WBC 4.0 - 10.5 K/uL 9.2 12.7(H) 4.5  Hemoglobin 12.0 - 15.0 g/dL 9.7(L) 9.7(L) 8.7(L)  Hematocrit 36.0 - 46.0 % 32.3(L) 32.9(L) 29.1(L)  Platelets 150 - 400 K/uL 274 314 233     Plan: Continue Warfarin 1mg  po daily  Monitor CBC qmwf with am labs   Monitor INR daily Monitor for signs and symptoms of bleeding   Margot Ables, PharmD Clinical Pharmacist 07/29/2019 11:55  AM

## 2019-07-29 NOTE — Progress Notes (Signed)
Patient has not voided and complaining of discomfort to lower abdomen. Bladder scan reading greater than 612 ml's. In and out catheter performed, 600 ml's of clear, yellow urine emptied from bladder.

## 2019-07-30 ENCOUNTER — Encounter (HOSPITAL_COMMUNITY): Payer: Self-pay | Admitting: Internal Medicine

## 2019-07-30 LAB — BASIC METABOLIC PANEL
Anion gap: 11 (ref 5–15)
BUN: 68 mg/dL — ABNORMAL HIGH (ref 8–23)
CO2: 25 mmol/L (ref 22–32)
Calcium: 9.2 mg/dL (ref 8.9–10.3)
Chloride: 105 mmol/L (ref 98–111)
Creatinine, Ser: 1.49 mg/dL — ABNORMAL HIGH (ref 0.44–1.00)
GFR calc Af Amer: 38 mL/min — ABNORMAL LOW (ref >60)
GFR calc non Af Amer: 32 mL/min — ABNORMAL LOW (ref >60)
Glucose, Bld: 149 mg/dL — ABNORMAL HIGH (ref 70–99)
Potassium: 4.6 mmol/L (ref 3.5–5.1)
Sodium: 141 mmol/L (ref 135–145)

## 2019-07-30 LAB — GLUCOSE, CAPILLARY
Glucose-Capillary: 144 mg/dL — ABNORMAL HIGH (ref 70–99)
Glucose-Capillary: 180 mg/dL — ABNORMAL HIGH (ref 70–99)
Glucose-Capillary: 171 mg/dL — ABNORMAL HIGH (ref 70–99)
Glucose-Capillary: 134 mg/dL — ABNORMAL HIGH (ref 70–99)

## 2019-07-30 LAB — CULTURE, BLOOD (ROUTINE X 2)
Culture: NO GROWTH
Culture: NO GROWTH
Special Requests: ADEQUATE
Special Requests: ADEQUATE

## 2019-07-30 LAB — CBC
HCT: 35.3 % — ABNORMAL LOW (ref 36.0–46.0)
Hemoglobin: 10.4 g/dL — ABNORMAL LOW (ref 12.0–15.0)
MCH: 31.9 pg (ref 26.0–34.0)
MCHC: 29.5 g/dL — ABNORMAL LOW (ref 30.0–36.0)
MCV: 108.3 fL — ABNORMAL HIGH (ref 80.0–100.0)
Platelets: 314 10*3/uL (ref 150–400)
RBC: 3.26 MIL/uL — ABNORMAL LOW (ref 3.87–5.11)
RDW: 14.6 % (ref 11.5–15.5)
WBC: 9.9 10*3/uL (ref 4.0–10.5)
nRBC: 0 % (ref 0.0–0.2)

## 2019-07-30 LAB — PROTIME-INR
INR: 2.5 — ABNORMAL HIGH (ref 0.8–1.2)
Prothrombin Time: 26.7 seconds — ABNORMAL HIGH (ref 11.4–15.2)

## 2019-07-30 MED ORDER — METHYLPREDNISOLONE SODIUM SUCC 40 MG IJ SOLR
40.0000 mg | Freq: Two times a day (BID) | INTRAMUSCULAR | Status: DC
  Administered 2019-07-30 – 2019-08-08 (×18): 40 mg via INTRAVENOUS
  Filled 2019-07-30 (×18): qty 1

## 2019-07-30 NOTE — Progress Notes (Signed)
PROGRESS NOTE    [name]  HER:[id number] DOB: [birth year] DOA: 07/25/2019 PCP: [name], FNP   Brief Narrative:  As per H&P written by Dr. [name] on 07/25/2019 84 y.o.female,w hypertension, hyperlipidemia, Dm2, CKD stage 3, Anemia, h/o pacer, Pafib, CAD, mild- mod Aortic regurgitation, mild Mitral regurgitation, CHF (EF 55-60%) , h/o Covid-19 infection, Copd, c/o increase in dyspnea for the past 5 days, Pt has had cough w grey sputum, pt denies fever, chills, cp, palp, n/v, abd pain, diarrhea, brbpr, black stool,   In ED,  T 98.5, P 64, R 16, Bp 119/66 pox 95%on 4L [place] WT 46.3  CXR IMPRESSION: 1. Low lung volumes with patchy mixed interstitial and airspace opacity with a basilar predominance. Findings are suspicious for multifocal pneumonia or aspiration though mixed interstitial and alveolar edema could have a similar appearance 2. Small right pleural effusion. 3. Stable cardiomegaly.  Na 142, K 3.7, Bun 34, creatinine 1.61 ASt 24, Alt 14 Wbc 7.5, Hgb 8.7, Plt 242 Trop 20 -> 18 BNP 790   COVID test negative  12/18:Patient continues to have significant hypoxemia and remains on 8 L nasal cannula. She also appears quite weak. We will plan to wean oxygen today and attempt a voiding trial with discontinuation of Foley catheter. PT evaluation will be necessary prior to discharge. Continue on Rocephin and azithromycin for multifocal pneumonia. Follow a.m. labs.  12/19: Patient is currently on 5 L nasal cannula oxygen.  She still appears quite weak.  PT recommending SNF on discharge.  Continuing azithromycin and Rocephin for pneumonia.  12/20: Patient is currently on 3-4 L nasal cannula oxygen.  She continues to remain weak with PT recommending SNF on discharge.  Her baseline is typically 2 L nasal cannula oxygen.  Assessment & Plan:   Principal Problem:   Pneumonia Active Problems:   Type 2 diabetes mellitus with kidney complication, without  long-term current use of insulin (HCC)   Essential hypertension   COPD GOLD O    COPD with acute exacerbation (HCC)   CKD (chronic kidney disease), stage III (HCC)   1-acute on chronic respiratory failure in the setting of multifocal pneumonia, COPD exacerbation and CHF exacerbation-improving -Patient heart failure exacerbation is acute on chronic diastolic heart failure. -Continue daily weights and low-sodium diet -Follow strict I's and O's. -For her COPD will continue the use of IV steroidswhich will be weaned to 40 mg 2 times daily, start patient on Brovana, Pulmicort and as needed bronchodilators. -Patient also started on flutter valve and Mucinex. -Continue antibiotics for multifocal pneumonia component.  -Wean off oxygen supplementation as tolerated (patient at baseline using 2-3 L).  Currently on 4L nasal cannula.  2-chronic atrial fibrillation -Rate control currently -Continue Coumadin per pharmacy -Continue Cardizem -Monitoring on telemetry.  3-Type 2 diabetes mellitus with kidney complication, without long-term current use of insulin (HCC) -With noted steroid-induced hyperglycemia. -While inpatient will hold oral hypoglycemic agents -Continue sliding scale insulin and Lantuswith doses that have been increased along with mealtime insulins that have been added.  4-Essential hypertension -Stable overall -Continue current antihypertensive regimen.  5-chronic kidney disease is stage III b. -Appears to be stable -Close monitoring of electrolytes and renal function in the setting of acute IV diuresis.  6-gastroesophageal reflux disease -Continue PPI and Carafate.  7-history of chronic pancreatitis -Continue Creon -Patient reports no abdominal pain, nausea or vomiting currently.  8-hyperlipidemia -Continue statins  9-urinary retention -Bladder scan demonstrated over 800 mL retained -Foley catheter removed on 12/18, but failed voiding trial  and therefore,  will keep in place until discharge  DVT prophylaxis:Chronically on Coumadin with therapeutic INR Code Status:Full code Family Communication:No family at bedside, tried calling daughter with no response Disposition Plan:Remains inpatient, continue treatment for CHF and COPD exacerbation; negative Covid 19 test; safe to use nebulizer management. Foley catheter to be replaced today. Continue monitoring Benicar response.PT evaluation indicating need for SNF.Wean oxygen as tolerated.  Consultants:  None  Procedures:  See below for x-ray reports  Antimicrobials:  Anti-infectives (From admission, onward)   Start     Dose/Rate Route Frequency Ordered Stop   07/25/19 1830  cefTRIAXone (ROCEPHIN) 2 g in sodium chloride 0.9 % 100 mL IVPB     2 g 200 mL/hr over 30 Minutes Intravenous Every 24 hours 07/25/19 1826 07/29/19 2146   07/25/19 1830  azithromycin (ZITHROMAX) 500 mg in sodium chloride 0.9 % 250 mL IVPB     500 mg 250 mL/hr over 60 Minutes Intravenous Every 24 hours 07/25/19 1826 07/29/19 2316       Subjective: Patient seen and evaluated today with no new acute complaints or concerns. No acute concerns or events noted overnight.  She continues to struggle with back pain and neck pain and has some weakness that is ongoing.  Oxygen has been weaned to 3-4 L nasal cannula.  Objective: Vitals:   07/30/19 0411 07/30/19 0433 07/30/19 0848 07/30/19 0850  BP: 139/84     Pulse: 99     Resp: 20     Temp: 98 F (36.7 C)     TempSrc: Oral     SpO2: 93%  95% 96%  Weight:  68.5 kg    Height:        Intake/Output Summary (Last 24 hours) at 07/30/2019 1224 Last data filed at 07/30/2019 0900 Gross per 24 hour  Intake 1200 ml  Output 1200 ml  Net 0 ml   Filed Weights   07/28/19 0612 07/29/19 0500 07/30/19 0433  Weight: 67.8 kg 67.9 kg 68.5 kg    Examination:  General exam: Appears calm and comfortable  Respiratory system: Clear to auscultation. Respiratory effort  normal.  Continues on 3-4 L nasal cannula. Cardiovascular system: S1 & S2 heard, RRR. No JVD, murmurs, rubs, gallops or clicks. No pedal edema. Gastrointestinal system: Abdomen is nondistended, soft and nontender. No organomegaly or masses felt. Normal bowel sounds heard. Central nervous system: Alert and awake. Extremities: Symmetric 5 x 5 power. Skin: No rashes, lesions or ulcers Psychiatry: Flat affect    Data Reviewed: I have personally reviewed following labs and imaging studies  CBC: Recent Labs  Lab 07/25/19 1555 07/26/19 0520 07/28/19 0532 07/29/19 0706 07/30/19 0704  WBC 7.5 4.5 12.7* 9.2 9.9  NEUTROABS 5.7  --   --   --   --   HGB 8.7* 8.7* 9.7* 9.7* 10.4*  HCT 29.8* 29.1* 32.9* 32.3* 35.3*  MCV 112.0* 109.8* 108.9* 109.1* 108.3*  PLT 242 233 314 274 101   Basic Metabolic Panel: Recent Labs  Lab 07/25/19 1555 07/26/19 0520 07/28/19 0532 07/29/19 0706 07/30/19 0704  NA 142 142 141 141 141  K 3.7 4.8 4.9 4.5 4.6  CL 104 106 108 109 105  CO2 25 25 20* 26 25  GLUCOSE 137* 184* 168* 163* 149*  BUN 34* 39* 71* 72* 68*  CREATININE 1.61* 1.62* 2.04* 1.61* 1.49*  CALCIUM 9.6 9.3 9.3 9.2 9.2   GFR: Estimated Creatinine Clearance: 26.4 mL/min (A) (by C-G formula based on SCr of 1.49 mg/dL (H)).  Liver Function Tests: Recent Labs  Lab 07/25/19 1555 07/26/19 0520  AST 24 22  ALT 14 15  ALKPHOS 90 83  BILITOT 0.5 0.6  PROT 7.1 6.6  ALBUMIN 3.2* 2.8*   No results for input(s): LIPASE, AMYLASE in the last 168 hours. No results for input(s): AMMONIA in the last 168 hours. Coagulation Profile: Recent Labs  Lab 07/26/19 0520 07/27/19 0521 07/28/19 0532 07/29/19 0706 07/30/19 0704  INR 2.0* 2.1* 2.4* 2.5* 2.5*   Cardiac Enzymes: No results for input(s): CKTOTAL, CKMB, CKMBINDEX, TROPONINI in the last 168 hours. BNP (last 3 results) No results for input(s): PROBNP in the last 8760 hours. HbA1C: Recent Labs    07/28/19 0823  HGBA1C 5.6   CBG: Recent  Labs  Lab 07/29/19 1120 07/29/19 1616 07/29/19 2223 07/30/19 0743 07/30/19 1100  GLUCAP 125* 157* 137* 144* 180*   Lipid Profile: No results for input(s): CHOL, HDL, LDLCALC, TRIG, CHOLHDL, LDLDIRECT in the last 72 hours. Thyroid Function Tests: No results for input(s): TSH, T4TOTAL, FREET4, T3FREE, THYROIDAB in the last 72 hours. Anemia Panel: No results for input(s): VITAMINB12, FOLATE, FERRITIN, TIBC, IRON, RETICCTPCT in the last 72 hours. Sepsis Labs: No results for input(s): PROCALCITON, LATICACIDVEN in the last 168 hours.  Recent Results (from the past 240 hour(s))  Culture, blood (Routine X 2) w Reflex to ID Panel     Status: None   Collection Time: 07/25/19  5:54 PM   Specimen: Right Antecubital; Blood  Result Value Ref Range Status   Specimen Description RIGHT ANTECUBITAL  Final   Special Requests   Final    BOTTLES DRAWN AEROBIC AND ANAEROBIC Blood Culture adequate volume   Culture   Final    NO GROWTH 5 DAYS Performed at [facility], [address], [place], [place] [address]    Report Status 07/30/2019 FINAL  Final  Culture, blood (Routine X 2) w Reflex to ID Panel     Status: None   Collection Time: 07/25/19  7:09 PM   Specimen: BLOOD LEFT WRIST  Result Value Ref Range Status   Specimen Description BLOOD LEFT WRIST  Final   Special Requests   Final    BOTTLES DRAWN AEROBIC AND ANAEROBIC Blood Culture adequate volume   Culture   Final    NO GROWTH 5 DAYS Performed at [id number], [address], [place], [place] [address]    Report Status 07/30/2019 FINAL  Final  SARS CORONAVIRUS 2 (TAT 6-24 HRS) Nasopharyngeal Nasopharyngeal Swab     Status: None   Collection Time: 07/25/19  7:13 PM   Specimen: Nasopharyngeal Swab  Result Value Ref Range Status   SARS Coronavirus 2 NEGATIVE NEGATIVE Final    Comment: (NOTE) SARS-CoV-2 target nucleic acids are NOT DETECTED. The SARS-CoV-2 RNA is generally detectable in upper and lower respiratory specimens during the  acute phase of infection. Negative results do not preclude SARS-CoV-2 infection, do not rule out co-infections with other pathogens, and should not be used as the sole basis for treatment or other patient management decisions. Negative results must be combined with clinical observations, patient history, and epidemiological information. The expected result is Negative. Fact Sheet for Patients: SugarRoll.be Fact Sheet for Healthcare Providers: https://www.woods-mathews.com/ This test is not yet approved or cleared by the [place] FDA and  has been authorized for detection and/or diagnosis of SARS-CoV-2 by FDA under an Emergency Use Authorization (EUA). This EUA will remain  in effect (meaning this test can be used) for the duration of  the COVID-19 declaration under Section 56 4(b)(1) of the Act, 21 U.S.C. section 360bbb-3(b)(1), unless the authorization is terminated or revoked sooner. Performed at [facility], [place] [address], [place], [place] [address]          Radiology Studies: No results found.      Scheduled Meds: . arformoterol  15 mcg Nebulization BID  . budesonide (PULMICORT) nebulizer solution  0.5 mg Nebulization BID  . dextromethorphan-guaiFENesin  1 tablet Oral BID  . diltiazem  120 mg Oral Daily  . feeding supplement (PRO-STAT SUGAR FREE 64)  30 mL Oral BID  . ferrous sulfate  325 mg Oral Q breakfast  . insulin aspart  0-20 Units Subcutaneous TID WC  . insulin aspart  0-5 Units Subcutaneous QHS  . insulin aspart  3 Units Subcutaneous TID WC  . insulin glargine  7 Units Subcutaneous QHS  . lidocaine  1 patch Transdermal Q24H  . lipase/protease/amylase  36,000 Units Oral TID WC  . methylPREDNISolone (SOLU-MEDROL) injection  40 mg Intravenous Q12H  . multivitamin-lutein  1 capsule Oral Daily  . pantoprazole  40 mg Oral BID  . potassium chloride SA  20 mEq Oral Daily  . simvastatin  10 mg Oral Daily  .  sucralfate  1 g Oral TID WC & HS  . warfarin  1 mg Oral q1800  . Warfarin - Pharmacist Dosing Inpatient   Does not apply q1800   Continuous Infusions:   LOS: 5 days    Time spent: 30 minutes    Tramaine Sauls Darleen Crocker, DO [facility] Pager [phone]  If 7PM-7AM, please contact night-coverage [email or web address] Password [facility] 07/30/2019, 12:24 PM

## 2019-07-30 NOTE — Progress Notes (Signed)
ANTICOAGULATION CONSULT NOTE -   Pharmacy Consult for warfarin dosing  Indication: atrial fibrillation   Allergies  Allergen Reactions  . Chlorhexidine   . Macrobid [Nitrofurantoin Monohyd Macro] Rash  . Torsemide Nausea And Vomiting  . Tramadol Nausea Only      Patient Measurements: Last Weight  Most recent update: 07/30/2019  4:33 AM   Weight  68.5 kg (151 lb 0.2 oz)           Body mass index is 27.62 kg/m. [name]               Temp: 98 F (36.7 C) (12/20 0411) Temp Source: Oral (12/20 0411) BP: 139/84 (12/20 0411) Pulse Rate: 99 (12/20 0411)  Labs: Recent Labs    07/28/19 0532 07/29/19 0706 07/30/19 0704  HGB 9.7* 9.7* 10.4*  HCT 32.9* 32.3* 35.3*  PLT 314 274 314  LABPROT 26.0* 27.0* 26.7*  INR 2.4* 2.5* 2.5*  CREATININE 2.04* 1.61* 1.49*    Estimated Creatinine Clearance: 26.4 mL/min (A) (by C-G formula based on SCr of 1.49 mg/dL (H)).  Goal of Therapy:  INR 2-3 Monitor CBC     Prior to Admission Warfarin Dosing:  Hildreth Orsak takes 1mg  of warfarin daily    Admit INR was 2.0 Lab Results  Component Value Date   INR 2.5 (H) 07/30/2019   INR 2.5 (H) 07/29/2019   INR 2.4 (H) 07/28/2019    Assessment: [name] a 84 y.o. female requires anticoagulation with warfarin for the indication of  Afib. Potential for drug drug interaction with azithromycin, will monitor closely. Warfarin will be initiated inpatient following pharmacy protocol per pharmacy consult. Patient most recent blood work is as follows:   CBC Latest Ref Rng & Units 07/30/2019 07/29/2019 07/28/2019  WBC 4.0 - 10.5 K/uL 9.9 9.2 12.7(H)  Hemoglobin 12.0 - 15.0 g/dL 10.4(L) 9.7(L) 9.7(L)  Hematocrit 36.0 - 46.0 % 35.3(L) 32.3(L) 32.9(L)  Platelets 150 - 400 K/uL 314 274 314     Plan: Continue Warfarin 1mg  po daily  Monitor CBC qmwf with am labs   Monitor INR daily Monitor for signs and symptoms of bleeding   Margot Ables, PharmD Clinical  Pharmacist 07/30/2019 8:46 AM

## 2019-07-31 ENCOUNTER — Inpatient Hospital Stay (HOSPITAL_COMMUNITY): Payer: Medicare HMO

## 2019-07-31 LAB — CBC
HCT: 35.8 % — ABNORMAL LOW (ref 36.0–46.0)
Hemoglobin: 10.7 g/dL — ABNORMAL LOW (ref 12.0–15.0)
MCH: 32.3 pg (ref 26.0–34.0)
MCHC: 29.9 g/dL — ABNORMAL LOW (ref 30.0–36.0)
MCV: 108.2 fL — ABNORMAL HIGH (ref 80.0–100.0)
Platelets: 315 10*3/uL (ref 150–400)
RBC: 3.31 MIL/uL — ABNORMAL LOW (ref 3.87–5.11)
RDW: 14.6 % (ref 11.5–15.5)
WBC: 10.6 10*3/uL — ABNORMAL HIGH (ref 4.0–10.5)
nRBC: 0 % (ref 0.0–0.2)

## 2019-07-31 LAB — BASIC METABOLIC PANEL
Anion gap: 11 (ref 5–15)
BUN: 63 mg/dL — ABNORMAL HIGH (ref 8–23)
CO2: 25 mmol/L (ref 22–32)
Calcium: 9.5 mg/dL (ref 8.9–10.3)
Chloride: 104 mmol/L (ref 98–111)
Creatinine, Ser: 1.24 mg/dL — ABNORMAL HIGH (ref 0.44–1.00)
GFR calc Af Amer: 47 mL/min — ABNORMAL LOW (ref >60)
GFR calc non Af Amer: 40 mL/min — ABNORMAL LOW (ref >60)
Glucose, Bld: 159 mg/dL — ABNORMAL HIGH (ref 70–99)
Potassium: 4.8 mmol/L (ref 3.5–5.1)
Sodium: 140 mmol/L (ref 135–145)

## 2019-07-31 LAB — GLUCOSE, CAPILLARY
Glucose-Capillary: 134 mg/dL — ABNORMAL HIGH (ref 70–99)
Glucose-Capillary: 131 mg/dL — ABNORMAL HIGH (ref 70–99)
Glucose-Capillary: 144 mg/dL — ABNORMAL HIGH (ref 70–99)
Glucose-Capillary: 195 mg/dL — ABNORMAL HIGH (ref 70–99)

## 2019-07-31 LAB — PROTIME-INR
INR: 2.5 — ABNORMAL HIGH (ref 0.8–1.2)
Prothrombin Time: 26.5 seconds — ABNORMAL HIGH (ref 11.4–15.2)

## 2019-07-31 MED ORDER — ACETYLCYSTEINE 20 % IN SOLN
4.0000 mL | Freq: Two times a day (BID) | RESPIRATORY_TRACT | Status: DC
  Administered 2019-07-31 – 2019-08-04 (×9): 4 mL via RESPIRATORY_TRACT
  Filled 2019-07-31 (×9): qty 4

## 2019-07-31 MED ORDER — ALBUTEROL SULFATE (2.5 MG/3ML) 0.083% IN NEBU
2.5000 mg | INHALATION_SOLUTION | RESPIRATORY_TRACT | Status: DC | PRN
  Administered 2019-07-31 – 2019-08-04 (×10): 2.5 mg via RESPIRATORY_TRACT
  Filled 2019-07-31 (×10): qty 3

## 2019-07-31 NOTE — Progress Notes (Signed)
Physical Therapy Treatment Patient Details Name: [name] MRN: [id number] DOB: 03-Dec-1934 Today's Date: 07/31/2019    History of Present Illness [name]  is a 84 y.o. female,  w hypertension, hyperlipidemia, Dm2, CKD stage 3, Anemia, h/o pacer, Pafib, CAD, mild- mod Aortic regurgitation, mild Mitral regurgitation, CHF (EF 55-60%) , h/o  Covid-19 infection, Copd, c/o increase in dyspnea for the past 5 days,  Pt has had cough w grey sputum, pt denies fever, chills, cp, palp, n/v, abd pain, diarrhea, brbpr, black stool,    PT Comments    Patient c/o moderate/severe pain right side of abdomin when attempting to sit up at bedside, had to roll to side and used bed rail to pull self up, had difficulty breathing with SpO2 dropped from 91% to 85% with exertion and limited to a few steps at bedside secondary to c/o fatigue.  Patient tolerated sitting up in chair after therapy - RN/NT notified and recommended to use RW when putting patient back to bed.  Patient will benefit from continued physical therapy in hospital and recommended venue below to increase strength, balance, endurance for safe ADLs and gait.    Follow Up Recommendations  SNF     Equipment Recommendations  None recommended by PT    Recommendations for Other Services       Precautions / Restrictions Precautions Precautions: Fall Precaution Comments: Home O2 dependent Restrictions Weight Bearing Restrictions: No    Mobility  Bed Mobility Overal bed mobility: Needs Assistance Bed Mobility: Supine to Sit     Supine to sit: Min guard;Min assist     General bed mobility comments: slow labored movement  Transfers Overall transfer level: Needs assistance Equipment used: Rolling walker (2 wheeled) Transfers: Sit to/from [facility] Sit to Stand: Min assist Stand pivot transfers: Min assist;Mod assist       General transfer comment: slow unsteady  movement  Ambulation/Gait Ambulation/Gait assistance: Min assist;Mod assist Gait Distance (Feet): 4 Feet Assistive device: Rolling walker (2 wheeled) Gait Pattern/deviations: Decreased step length - right;Decreased step length - left;Decreased stride length;Shuffle;Trunk flexed Gait velocity: decreased   General Gait Details: limited to 4-5 slow unsteady labored steps due to c/o fatigue, difficulty breathing   Stairs             Wheelchair Mobility    Modified Rankin (Stroke Patients Only)       Balance Overall balance assessment: Needs assistance Sitting-balance support: Feet supported;No upper extremity supported Sitting balance-Leahy Scale: Fair Sitting balance - Comments: fair/good seated at EOB   Standing balance support: Bilateral upper extremity supported;During functional activity Standing balance-Leahy Scale: Poor Standing balance comment: fair/poor using RW                            Cognition Arousal/Alertness: Awake/alert Behavior During Therapy: WFL for tasks assessed/performed Overall Cognitive Status: Within Functional Limits for tasks assessed                                        Exercises General Exercises - Lower Extremity Long Arc Quad: Seated;AROM;Strengthening;Both;10 reps Hip Flexion/Marching: Seated;AROM;Strengthening;Both;10 reps Toe Raises: Seated;AROM;Strengthening;Both;10 reps Heel Raises: Seated;AROM;Strengthening;Both;10 reps    General Comments        Pertinent Vitals/Pain Pain Assessment: Faces Faces Pain Scale: Hurts little more Pain Location: right side of abdomen, throat Pain Descriptors / Indicators: Sore;Discomfort;Grimacing;Guarding Pain Intervention(s):  Limited activity within patient's tolerance;Monitored during session;Repositioned    Home Living                      Prior Function            PT Goals (current goals can now be found in the care plan section) Acute Rehab  PT Goals Patient Stated Goal: Return home with family PT Goal Formulation: With patient Time For Goal Achievement: 08/11/19 Potential to Achieve Goals: Fair Progress towards PT goals: Progressing toward goals    Frequency    Min 3X/week      PT Plan Current plan remains appropriate    Co-evaluation              AM-PAC PT "6 Clicks" Mobility   Outcome Measure  Help needed turning from your back to your side while in a flat bed without using bedrails?: A Little Help needed moving from lying on your back to sitting on the side of a flat bed without using bedrails?: A Little Help needed moving to and from a bed to a chair (including a wheelchair)?: A Lot Help needed standing up from a chair using your arms (e.g., wheelchair or bedside chair)?: A Lot Help needed to walk in hospital room?: A Lot Help needed climbing 3-5 steps with a railing? : A Lot 6 Click Score: 14    End of Session   Activity Tolerance: Patient tolerated treatment well Patient left: in chair;with call bell/phone within reach;with chair alarm set Nurse Communication: Mobility status PT Visit Diagnosis: Unsteadiness on feet (R26.81);Other abnormalities of gait and mobility (R26.89);Muscle weakness (generalized) (M62.81)     Time: 2831-5176 PT Time Calculation (min) (ACUTE ONLY): 21 min  Charges:  $Therapeutic Exercise: 8-22 mins $Therapeutic Activity: 8-22 mins                     4:24 PM, 07/31/19 Lonell Grandchild, MPT Physical Therapist with [facility] [phone] office [phone] mobile phone

## 2019-07-31 NOTE — Care Management Important Message (Signed)
Important Message  Patient Details  Name: [name] MRN: [id number] Date of Birth: [birth year]   Medicare Important Message Given:  Ilean Skill, RN to deliver to patient's room)     Tommy Medal 07/31/2019, 3:50 PM

## 2019-07-31 NOTE — Progress Notes (Signed)
ANTICOAGULATION CONSULT NOTE -   Pharmacy Consult for warfarin dosing  Indication: atrial fibrillation   Allergies  Allergen Reactions  . Chlorhexidine   . Macrobid [Nitrofurantoin Monohyd Macro] Rash  . Torsemide Nausea And Vomiting  . Tramadol Nausea Only      Patient Measurements: Last Weight  Most recent update: 07/31/2019  5:42 AM   Weight  68.9 kg (151 lb 14.4 oz)           Body mass index is 27.78 kg/m. [name]               Temp: 97.9 F (36.6 C) (12/21 0530) Temp Source: Oral (12/21 0530) BP: 142/83 (12/21 0530) Pulse Rate: 98 (12/21 0543)  Labs: Recent Labs    07/29/19 0706 07/30/19 0704 07/31/19 0523  HGB 9.7* 10.4* 10.7*  HCT 32.3* 35.3* 35.8*  PLT 274 314 315  LABPROT 27.0* 26.7* 26.5*  INR 2.5* 2.5* 2.5*  CREATININE 1.61* 1.49* 1.24*    Estimated Creatinine Clearance: 31.8 mL/min (A) (by C-G formula based on SCr of 1.24 mg/dL (H)).  Goal of Therapy:  INR 2-3 Monitor CBC     Prior to Admission Warfarin Dosing:  Georgianna Band takes 1mg  of warfarin daily    Admit INR was 2.0 Lab Results  Component Value Date   INR 2.5 (H) 07/31/2019   INR 2.5 (H) 07/30/2019   INR 2.5 (H) 07/29/2019    Assessment: [name] a 84 y.o. female requires anticoagulation with warfarin for the indication of  Afib. Potential for drug drug interaction with azithromycin, will monitor closely. Warfarin will be initiated inpatient following pharmacy protocol per pharmacy consult. Patient most recent blood work is as follows:   CBC Latest Ref Rng & Units 07/31/2019 07/30/2019 07/29/2019  WBC 4.0 - 10.5 K/uL 10.6(H) 9.9 9.2  Hemoglobin 12.0 - 15.0 g/dL 10.7(L) 10.4(L) 9.7(L)  Hematocrit 36.0 - 46.0 % 35.8(L) 35.3(L) 32.3(L)  Platelets 150 - 400 K/uL 315 314 274     Plan: Continue Warfarin 1mg  po daily  Monitor CBC qmwf with am labs   Monitor INR daily Monitor for signs and symptoms of bleeding   Margot Ables, PharmD Clinical  Pharmacist 07/31/2019 8:28 AM

## 2019-07-31 NOTE — Telephone Encounter (Signed)
LMOVM

## 2019-07-31 NOTE — Progress Notes (Signed)
PROGRESS NOTE    [name]  ZOX:[id number] DOB: [birth year] DOA: 07/25/2019 PCP: [name], FNP   Brief Narrative:  As per H&P written by Dr. [name] on 07/25/2019 84 y.o.female,w hypertension, hyperlipidemia, Dm2, CKD stage 3, Anemia, h/o pacer, Pafib, CAD, mild- mod Aortic regurgitation, mild Mitral regurgitation, CHF (EF 55-60%) , h/o Covid-19 infection, Copd, c/o increase in dyspnea for the past 5 days, Pt has had cough w grey sputum, pt denies fever, chills, cp, palp, n/v, abd pain, diarrhea, brbpr, black stool,   In ED,  T 98.5, P 64, R 16, Bp 119/66 pox 95%on 4L [place] WT 46.3  CXR IMPRESSION: 1. Low lung volumes with patchy mixed interstitial and airspace opacity with a basilar predominance. Findings are suspicious for multifocal pneumonia or aspiration though mixed interstitial and alveolar edema could have a similar appearance 2. Small right pleural effusion. 3. Stable cardiomegaly.  Na 142, K 3.7, Bun 34, creatinine 1.61 ASt 24, Alt 14 Wbc 7.5, Hgb 8.7, Plt 242 Trop 20 -> 18 BNP 790   COVID test negative  12/18:Patient continues to have significant hypoxemia and remains on 8 L nasal cannula. She also appears quite weak. We will plan to wean oxygen today and attempt a voiding trial with discontinuation of Foley catheter. PT evaluation will be necessary prior to discharge. Continue on Rocephin and azithromycin for multifocal pneumonia. Follow a.m. labs.  12/19: Patient is currently on 5 L nasal cannula oxygen. She still appears quite weak. PT recommending SNF on discharge. Continuing azithromycin and Rocephin for pneumonia.  12/20: Patient is currently on 3-4 L nasal cannula oxygen.  She continues to remain weak with PT recommending SNF on discharge.  Her baseline is typically 2 L nasal cannula oxygen.  12/21: Today patient is having ongoing cough with sputum production and is back to 6 L nasal cannula oxygen.  We will plan to repeat  chest x-ray and start Mucomyst today.  She overall has net negative fluid balance of 1.58 L and creatinine has improved.  Assessment & Plan:   Principal Problem:   Pneumonia Active Problems:   Type 2 diabetes mellitus with kidney complication, without long-term current use of insulin (HCC)   Essential hypertension   COPD GOLD O    COPD with acute exacerbation (HCC)   CKD (chronic kidney disease), stage III (HCC)   1-acute on chronic respiratory failure in the setting of multifocal pneumonia, COPD exacerbation and CHF exacerbation-improving -Patient heart failure exacerbation is acute on chronic diastolic heart failure. -Continue daily weightsand low-sodium diet -Follow strict I's and O's. -For her COPD will continue the use of IV steroidswhich will be weaned to 40 mg 2 times daily, start patient on Brovana, Pulmicort and as needed bronchodilators. -Continue antibiotics for multifocal pneumonia component.  Plan to recheck procalcitonin in a.m. and discontinue antibiotics based on results.  She is currently day 7 of 7 with the antibiotics. -Wean off oxygen supplementation as tolerated (patient at baseline using 2-3 L).Currently on 6L nasal cannula. -Plan to check chest x-ray today 12/21 -Start Mucomyst for chest congestion and continue flutter valve  2-chronic atrial fibrillation -Rate control currently -Continue Coumadin per pharmacy -Continue Cardizem -Monitoring on telemetry.  3-Type 2 diabetes mellitus with kidney complication, without long-term current use of insulin (HCC) -With noted steroid-induced hyperglycemia. -While inpatient will hold oral hypoglycemic agents -Continue sliding scale insulin and Lantuswith doses that have been increased along with mealtime insulins that have been added.  4-Essential hypertension -Stable overall -Continue current antihypertensive regimen.  5-chronic kidney disease is stage III b. -Appears to be stable -Close monitoring of  electrolytes and renal function in the setting of acute IV diuresis.  6-gastroesophageal reflux disease -Continue PPI and Carafate.  7-history of chronic pancreatitis -Continue Creon -Patient reports no abdominal pain, nausea or vomiting currently.  8-hyperlipidemia -Continue statins  9-urinary retention -Bladder scan demonstrated over 800 mL retained -Foley catheterremoved on 12/18, but failed voiding trial and therefore, will keep in place until discharge  DVT prophylaxis:Chronically on Coumadin with therapeutic INR Code Status:Full code Family Communication:No family at bedside, tried calling daughter multiple times with no response Disposition Plan:Remains inpatient, continue treatment for CHF and COPD exacerbation; negative Covid 19 test; safe to use nebulizer management.  Continue Foley catheter until discharge.  PT indicating need for SNF on discharge and patient seems agreeable to go there when ready.  Repeat chest x-ray today and start Mucomyst.  Consultants:  None  Procedures:  See below for x-ray reports  Antimicrobials:  Anti-infectives (From admission, onward)   Start     Dose/Rate Route Frequency Ordered Stop   07/25/19 1830  cefTRIAXone (ROCEPHIN) 2 g in sodium chloride 0.9 % 100 mL IVPB     2 g 200 mL/hr over 30 Minutes Intravenous Every 24 hours 07/25/19 1826 07/29/19 2146   07/25/19 1830  azithromycin (ZITHROMAX) 500 mg in sodium chloride 0.9 % 250 mL IVPB     500 mg 250 mL/hr over 60 Minutes Intravenous Every 24 hours 07/25/19 1826 07/29/19 2316       Subjective: Patient seen and evaluated today with ongoing productive cough with significant chest congestion.  She has been increased back to 6 L nasal cannula this morning.  She maintains a negative fluid balance.  Objective: Vitals:   07/31/19 0543 07/31/19 0810 07/31/19 0813 07/31/19 1008  BP:      Pulse: 98     Resp:      Temp:      TempSrc:      SpO2: 95% 92% 96% 97%  Weight:       Height:        Intake/Output Summary (Last 24 hours) at 07/31/2019 1158 Last data filed at 07/31/2019 0547 Gross per 24 hour  Intake 720 ml  Output 1350 ml  Net -630 ml   Filed Weights   07/29/19 0500 07/30/19 0433 07/31/19 0500  Weight: 67.9 kg 68.5 kg 68.9 kg    Examination:  General exam: Appears calm and comfortable, thin/cachectic Respiratory system: Clear to auscultation. Respiratory effort normal.  Currently on 6 L nasal cannula oxygen.  Significant rhonchi noted bilaterally. Cardiovascular system: S1 & S2 heard, RRR. No JVD, murmurs, rubs, gallops or clicks. No pedal edema. Gastrointestinal system: Abdomen is nondistended, soft and nontender. No organomegaly or masses felt. Normal bowel sounds heard. Central nervous system: Alert and oriented. No focal neurological deficits. Extremities: Symmetric 5 x 5 power. Skin: No rashes, lesions or ulcers Psychiatry: Judgement and insight appear normal. Mood & affect appropriate.     Data Reviewed: I have personally reviewed following labs and imaging studies  CBC: Recent Labs  Lab 07/25/19 1555 07/26/19 0520 07/28/19 0532 07/29/19 0706 07/30/19 0704 07/31/19 0523  WBC 7.5 4.5 12.7* 9.2 9.9 10.6*  NEUTROABS 5.7  --   --   --   --   --   HGB 8.7* 8.7* 9.7* 9.7* 10.4* 10.7*  HCT 29.8* 29.1* 32.9* 32.3* 35.3* 35.8*  MCV 112.0* 109.8* 108.9* 109.1* 108.3* 108.2*  PLT 242 233 314 274  314 782   Basic Metabolic Panel: Recent Labs  Lab 07/26/19 0520 07/28/19 0532 07/29/19 0706 07/30/19 0704 07/31/19 0523  NA 142 141 141 141 140  K 4.8 4.9 4.5 4.6 4.8  CL 106 108 109 105 104  CO2 25 20* 26 25 25   GLUCOSE 184* 168* 163* 149* 159*  BUN 39* 71* 72* 68* 63*  CREATININE 1.62* 2.04* 1.61* 1.49* 1.24*  CALCIUM 9.3 9.3 9.2 9.2 9.5   GFR: Estimated Creatinine Clearance: 31.8 mL/min (A) (by C-G formula based on SCr of 1.24 mg/dL (H)). Liver Function Tests: Recent Labs  Lab 07/25/19 1555 07/26/19 0520  AST 24 22    ALT 14 15  ALKPHOS 90 83  BILITOT 0.5 0.6  PROT 7.1 6.6  ALBUMIN 3.2* 2.8*   No results for input(s): LIPASE, AMYLASE in the last 168 hours. No results for input(s): AMMONIA in the last 168 hours. Coagulation Profile: Recent Labs  Lab 07/27/19 0521 07/28/19 0532 07/29/19 0706 07/30/19 0704 07/31/19 0523  INR 2.1* 2.4* 2.5* 2.5* 2.5*   Cardiac Enzymes: No results for input(s): CKTOTAL, CKMB, CKMBINDEX, TROPONINI in the last 168 hours. BNP (last 3 results) No results for input(s): PROBNP in the last 8760 hours. HbA1C: No results for input(s): HGBA1C in the last 72 hours. CBG: Recent Labs  Lab 07/30/19 1100 07/30/19 1605 07/30/19 2103 07/31/19 0754 07/31/19 1149  GLUCAP 180* 171* 134* 134* 131*   Lipid Profile: No results for input(s): CHOL, HDL, LDLCALC, TRIG, CHOLHDL, LDLDIRECT in the last 72 hours. Thyroid Function Tests: No results for input(s): TSH, T4TOTAL, FREET4, T3FREE, THYROIDAB in the last 72 hours. Anemia Panel: No results for input(s): VITAMINB12, FOLATE, FERRITIN, TIBC, IRON, RETICCTPCT in the last 72 hours. Sepsis Labs: No results for input(s): PROCALCITON, LATICACIDVEN in the last 168 hours.  Recent Results (from the past 240 hour(s))  Culture, blood (Routine X 2) w Reflex to ID Panel     Status: None   Collection Time: 07/25/19  5:54 PM   Specimen: Right Antecubital; Blood  Result Value Ref Range Status   Specimen Description RIGHT ANTECUBITAL  Final   Special Requests   Final    BOTTLES DRAWN AEROBIC AND ANAEROBIC Blood Culture adequate volume   Culture   Final    NO GROWTH 5 DAYS Performed at [facility], [address], [place], [place] [address]    Report Status 07/30/2019 FINAL  Final  Culture, blood (Routine X 2) w Reflex to ID Panel     Status: None   Collection Time: 07/25/19  7:09 PM   Specimen: BLOOD LEFT WRIST  Result Value Ref Range Status   Specimen Description BLOOD LEFT WRIST  Final   Special Requests   Final    BOTTLES  DRAWN AEROBIC AND ANAEROBIC Blood Culture adequate volume   Culture   Final    NO GROWTH 5 DAYS Performed at [facility], [address], [place],  [address]    Report Status 07/30/2019 FINAL  Final  SARS CORONAVIRUS 2 (TAT 6-24 HRS) Nasopharyngeal Nasopharyngeal Swab     Status: None   Collection Time: 07/25/19  7:13 PM   Specimen: Nasopharyngeal Swab  Result Value Ref Range Status   SARS Coronavirus 2 NEGATIVE NEGATIVE Final    Comment: (NOTE) SARS-CoV-2 target nucleic acids are NOT DETECTED. The SARS-CoV-2 RNA is generally detectable in upper and lower respiratory specimens during the acute phase of infection. Negative results do not preclude SARS-CoV-2 infection, do not rule out co-infections with other  pathogens, and should not be used as the sole basis for treatment or other patient management decisions. Negative results must be combined with clinical observations, patient history, and epidemiological information. The expected result is Negative. Fact Sheet for Patients: SugarRoll.be Fact Sheet for Healthcare Providers: https://www.woods-mathews.com/ This test is not yet approved or cleared by the [place] FDA and  has been authorized for detection and/or diagnosis of SARS-CoV-2 by FDA under an Emergency Use Authorization (EUA). This EUA will remain  in effect (meaning this test can be used) for the duration of the COVID-19 declaration under Section 56 4(b)(1) of the Act, 21 U.S.C. section 360bbb-3(b)(1), unless the authorization is terminated or revoked sooner. Performed at [facility], [place] [address], [place], [place] [address]          Radiology Studies: No results found.      Scheduled Meds: . acetylcysteine  4 mL Nebulization BID  . arformoterol  15 mcg Nebulization BID  . budesonide (PULMICORT) nebulizer solution  0.5 mg Nebulization BID  . dextromethorphan-guaiFENesin  1 tablet Oral BID  .  diltiazem  120 mg Oral Daily  . feeding supplement (PRO-STAT SUGAR FREE 64)  30 mL Oral BID  . ferrous sulfate  325 mg Oral Q breakfast  . insulin aspart  0-20 Units Subcutaneous TID WC  . insulin aspart  0-5 Units Subcutaneous QHS  . insulin aspart  3 Units Subcutaneous TID WC  . insulin glargine  7 Units Subcutaneous QHS  . lidocaine  1 patch Transdermal Q24H  . lipase/protease/amylase  36,000 Units Oral TID WC  . methylPREDNISolone (SOLU-MEDROL) injection  40 mg Intravenous Q12H  . multivitamin-lutein  1 capsule Oral Daily  . pantoprazole  40 mg Oral BID  . potassium chloride SA  20 mEq Oral Daily  . simvastatin  10 mg Oral Daily  . sucralfate  1 g Oral TID WC & HS  . warfarin  1 mg Oral q1800  . Warfarin - Pharmacist Dosing Inpatient   Does not apply q1800   Continuous Infusions:   LOS: 6 days    Time spent: 30 minutes    [employer] Darleen Crocker, DO [facility] Pager [phone]  If 7PM-7AM, please contact night-coverage [email or web address] Password [facility] 07/31/2019, 11:58 AM

## 2019-08-01 LAB — PROTIME-INR
INR: 2.4 — ABNORMAL HIGH (ref 0.8–1.2)
Prothrombin Time: 25.8 seconds — ABNORMAL HIGH (ref 11.4–15.2)

## 2019-08-01 LAB — BASIC METABOLIC PANEL
Anion gap: 10 (ref 5–15)
BUN: 61 mg/dL — ABNORMAL HIGH (ref 8–23)
CO2: 23 mmol/L (ref 22–32)
Calcium: 9.5 mg/dL (ref 8.9–10.3)
Chloride: 107 mmol/L (ref 98–111)
Creatinine, Ser: 1.15 mg/dL — ABNORMAL HIGH (ref 0.44–1.00)
GFR calc Af Amer: 51 mL/min — ABNORMAL LOW (ref >60)
GFR calc non Af Amer: 44 mL/min — ABNORMAL LOW (ref >60)
Glucose, Bld: 196 mg/dL — ABNORMAL HIGH (ref 70–99)
Potassium: 4.8 mmol/L (ref 3.5–5.1)
Sodium: 140 mmol/L (ref 135–145)

## 2019-08-01 LAB — GLUCOSE, CAPILLARY
Glucose-Capillary: 150 mg/dL — ABNORMAL HIGH (ref 70–99)
Glucose-Capillary: 236 mg/dL — ABNORMAL HIGH (ref 70–99)
Glucose-Capillary: 153 mg/dL — ABNORMAL HIGH (ref 70–99)
Glucose-Capillary: 171 mg/dL — ABNORMAL HIGH (ref 70–99)

## 2019-08-01 MED ORDER — POLYETHYLENE GLYCOL 3350 17 G PO PACK
17.0000 g | PACK | Freq: Every day | ORAL | Status: DC
  Administered 2019-08-01 – 2019-08-08 (×5): 17 g via ORAL
  Filled 2019-08-01 (×6): qty 1

## 2019-08-01 MED ORDER — FUROSEMIDE 10 MG/ML IJ SOLN
40.0000 mg | Freq: Two times a day (BID) | INTRAMUSCULAR | Status: DC
  Administered 2019-08-01 – 2019-08-05 (×9): 40 mg via INTRAVENOUS
  Filled 2019-08-01 (×9): qty 4

## 2019-08-01 NOTE — Progress Notes (Signed)
PROGRESS NOTE    [name]  MWN:[id number] DOB: 23-Dec-1934 DOA: 07/25/2019 PCP: [name], FNP   Brief Narrative:  As per H&P written by Dr. [name] on 07/25/2019 84 y.o.female,w hypertension, hyperlipidemia, Dm2, CKD stage 3, Anemia, h/o pacer, Pafib, CAD, mild- mod Aortic regurgitation, mild Mitral regurgitation, CHF (EF 55-60%) , h/o Covid-19 infection, Copd, c/o increase in dyspnea for the past 5 days, Pt has had cough w grey sputum, pt denies fever, chills, cp, palp, n/v, abd pain, diarrhea, brbpr, black stool,   In ED,  T 98.5, P 64, R 16, Bp 119/66 pox 95%on 4L [place] WT 46.3  CXR IMPRESSION: 1. Low lung volumes with patchy mixed interstitial and airspace opacity with a basilar predominance. Findings are suspicious for multifocal pneumonia or aspiration though mixed interstitial and alveolar edema could have a similar appearance 2. Small right pleural effusion. 3. Stable cardiomegaly.  Na 142, K 3.7, Bun 34, creatinine 1.61 ASt 24, Alt 14 Wbc 7.5, Hgb 8.7, Plt 242 Trop 20 -> 18 BNP 790   COVID test negative  12/18:Patient continues to have significant hypoxemia and remains on 8 L nasal cannula. She also appears quite weak. We will plan to wean oxygen today and attempt a voiding trial with discontinuation of Foley catheter. PT evaluation will be necessary prior to discharge. Continue on Rocephin and azithromycin for multifocal pneumonia. Follow a.m. labs.  12/19: Patient is currently on 5 L nasal cannula oxygen. She still appears quite weak. PT recommending SNF on discharge. Continuing azithromycin and Rocephin for pneumonia.  12/20:Patient is currently on 3-4 L nasal cannula oxygen. She continues to remain weak with PT recommending SNF on discharge. Her baseline is typically 2 L nasal cannula oxygen.  12/21: Today patient is having ongoing cough with sputum production and is back to 6 L nasal cannula oxygen.  We will plan to repeat  chest x-ray and start Mucomyst today.  She overall has net negative fluid balance of 1.58 L and creatinine has improved.  12/22: Patient appears to have worsening edema noted on chest x-ray 12/21.  Her oxygen requirements have increased to 8 L this morning.  We will plan to diurese further with Lasix 40 mg twice daily.  Start MiraLAX for bowel movement as well.  Wean oxygen as tolerated.  Maintain on Foley catheter given urinary retention issues.  Requires SNF/rehab on discharge and patient appears agreeable.  Assessment & Plan:   Principal Problem:   Pneumonia Active Problems:   Type 2 diabetes mellitus with kidney complication, without long-term current use of insulin (HCC)   Essential hypertension   COPD GOLD O    COPD with acute exacerbation (HCC)   CKD (chronic kidney disease), stage III (HCC)   1-acute on chronic respiratory failure in the setting of multifocal pneumonia, COPD exacerbation and CHF exacerbation -Patient heart failure exacerbation is acute on chronic diastolic heart failure.  Repeat chest x-ray on 12/21 demonstrates increasing pulmonary edema.  Will resume Lasix 40 mg IV twice daily today. -Continue daily weightsand low-sodium diet -Follow strict I's and O's. -For her COPD will continue the use of IV steroidswhich will be weaned to 40 mg2times daily, start patient on Brovana, Pulmicort and as needed bronchodilators. -Discontinue antibiotics -Wean off oxygen supplementation as tolerated (patient at baseline using 2-3 L).Currently on8Lnasal cannula. -Plan to check chest x-ray today 12/21 -Start Mucomyst for chest congestion and continue flutter valve  2-chronic atrial fibrillation -Rate control currently -Continue Coumadin per pharmacy -Continue Cardizem -Monitoring on telemetry.  3-Type  2 diabetes mellitus with kidney complication, without long-term current use of insulin ([place]) -With noted steroid-induced hyperglycemia. -While inpatient will hold oral  hypoglycemic agents -Continue sliding scale insulin and Lantuswith doses that have been increased along with mealtime insulins that have been added.  4-Essential hypertension -Stable overall -Continue current antihypertensive regimen.  5-chronic kidney disease is stage III b. -Appears to be stable -Close monitoring of electrolytes and renal function in the setting of acute IV diuresis.  6-gastroesophageal reflux disease -Continue PPI and Carafate.  7-history of chronic pancreatitis -Continue Creon -Patient reports no abdominal pain, nausea or vomiting currently.  8-hyperlipidemia -Continue statins  9-urinary retention -Bladder scan demonstrated over 800 mL retained -Foley catheterremoved on 12/18, but failed voiding trial andtherefore, will keep in place until discharge  DVT prophylaxis:Chronically on Coumadinwith therapeutic INR Code Status:Full code Family Communication:No family at bedside, tried calling daughter multiple times with no response Disposition Plan:Remains inpatient, continue treatment for CHF and COPD exacerbation; negative Covid 19 test; safe to use nebulizer management.  Continue Foley catheter until discharge.  PT indicating need for SNF on discharge and patient seems agreeable to go there when ready.  Resume Lasix IV twice daily on 12/21 and wean oxygen to baseline of 2 L as tolerated.  Consultants:  None  Procedures:  See below for x-ray reports  Antimicrobials:  Anti-infectives (From admission, onward)   Start     Dose/Rate Route Frequency Ordered Stop   07/25/19 1830  cefTRIAXone (ROCEPHIN) 2 g in sodium chloride 0.9 % 100 mL IVPB     2 g 200 mL/hr over 30 Minutes Intravenous Every 24 hours 07/25/19 1826 07/29/19 2146   07/25/19 1830  azithromycin (ZITHROMAX) 500 mg in sodium chloride 0.9 % 250 mL IVPB     500 mg 250 mL/hr over 60 Minutes Intravenous Every 24 hours 07/25/19 1826 07/29/19 2316       Subjective: Patient seen  and evaluated today and complaining of ongoing back pain along with some mild abdominal pain with no bowel movement noted recently.  She is currently on 8 L nasal cannula oxygen.  Still appears to have some ongoing congestion.  Objective: Vitals:   07/31/19 2058 08/01/19 0500 08/01/19 0610 08/01/19 0758  BP: (!) 157/74  135/88   Pulse: 94  (!) 102   Resp: (!) 22  20   Temp: 97.6 F (36.4 C)  97.9 F (36.6 C)   TempSrc: Oral  Oral   SpO2: 91%  98% 96%  Weight:  68.5 kg    Height:        Intake/Output Summary (Last 24 hours) at 08/01/2019 0947 Last data filed at 08/01/2019 0521 Gross per 24 hour  Intake 440 ml  Output 1200 ml  Net -760 ml   Filed Weights   07/30/19 0433 07/31/19 0500 08/01/19 0500  Weight: 68.5 kg 68.9 kg 68.5 kg    Examination:  General exam: Appears calm and comfortable, frail Respiratory system: Clear to auscultation. Respiratory effort normal.  8 L nasal cannula oxygen. Cardiovascular system: S1 & S2 heard, RRR. No JVD, murmurs, rubs, gallops or clicks. No pedal edema. Gastrointestinal system: Abdomen is nondistended, soft and nontender. No organomegaly or masses felt. Normal bowel sounds heard. Central nervous system: Alert and oriented. No focal neurological deficits. Extremities: Symmetric 5 x 5 power. Skin: No rashes, lesions or ulcers Psychiatry: Judgement and insight appear normal. Mood & affect appropriate.     Data Reviewed: I have personally reviewed following labs and imaging studies  CBC:  Recent Labs  Lab 07/25/19 1555 07/26/19 0520 07/28/19 0532 07/29/19 0706 07/30/19 0704 07/31/19 0523  WBC 7.5 4.5 12.7* 9.2 9.9 10.6*  NEUTROABS 5.7  --   --   --   --   --   HGB 8.7* 8.7* 9.7* 9.7* 10.4* 10.7*  HCT 29.8* 29.1* 32.9* 32.3* 35.3* 35.8*  MCV 112.0* 109.8* 108.9* 109.1* 108.3* 108.2*  PLT 242 233 314 274 314 161   Basic Metabolic Panel: Recent Labs  Lab 07/28/19 0532 07/29/19 0706 07/30/19 0704 07/31/19 0523 08/01/19 0533   NA 141 141 141 140 140  K 4.9 4.5 4.6 4.8 4.8  CL 108 109 105 104 107  CO2 20* 26 25 25 23   GLUCOSE 168* 163* 149* 159* 196*  BUN 71* 72* 68* 63* 61*  CREATININE 2.04* 1.61* 1.49* 1.24* 1.15*  CALCIUM 9.3 9.2 9.2 9.5 9.5   GFR: Estimated Creatinine Clearance: 34.2 mL/min (A) (by C-G formula based on SCr of 1.15 mg/dL (H)). Liver Function Tests: Recent Labs  Lab 07/25/19 1555 07/26/19 0520  AST 24 22  ALT 14 15  ALKPHOS 90 83  BILITOT 0.5 0.6  PROT 7.1 6.6  ALBUMIN 3.2* 2.8*   No results for input(s): LIPASE, AMYLASE in the last 168 hours. No results for input(s): AMMONIA in the last 168 hours. Coagulation Profile: Recent Labs  Lab 07/28/19 0532 07/29/19 0706 07/30/19 0704 07/31/19 0523 08/01/19 0533  INR 2.4* 2.5* 2.5* 2.5* 2.4*   Cardiac Enzymes: No results for input(s): CKTOTAL, CKMB, CKMBINDEX, TROPONINI in the last 168 hours. BNP (last 3 results) No results for input(s): PROBNP in the last 8760 hours. HbA1C: No results for input(s): HGBA1C in the last 72 hours. CBG: Recent Labs  Lab 07/31/19 0754 07/31/19 1149 07/31/19 1639 07/31/19 2134 08/01/19 0729  GLUCAP 134* 131* 144* 195* 150*   Lipid Profile: No results for input(s): CHOL, HDL, LDLCALC, TRIG, CHOLHDL, LDLDIRECT in the last 72 hours. Thyroid Function Tests: No results for input(s): TSH, T4TOTAL, FREET4, T3FREE, THYROIDAB in the last 72 hours. Anemia Panel: No results for input(s): VITAMINB12, FOLATE, FERRITIN, TIBC, IRON, RETICCTPCT in the last 72 hours. Sepsis Labs: No results for input(s): PROCALCITON, LATICACIDVEN in the last 168 hours.  Recent Results (from the past 240 hour(s))  Culture, blood (Routine X 2) w Reflex to ID Panel     Status: None   Collection Time: 07/25/19  5:54 PM   Specimen: Right Antecubital; Blood  Result Value Ref Range Status   Specimen Description RIGHT ANTECUBITAL  Final   Special Requests   Final    BOTTLES DRAWN AEROBIC AND ANAEROBIC Blood Culture adequate  volume   Culture   Final    NO GROWTH 5 DAYS Performed at [facility], [address] [place], [place] [address]    Report Status 07/30/2019 FINAL  Final  Culture, blood (Routine X 2) w Reflex to ID Panel     Status: None   Collection Time: 07/25/19  7:09 PM   Specimen: BLOOD LEFT WRIST  Result Value Ref Range Status   Specimen Description BLOOD LEFT WRIST  Final   Special Requests   Final    BOTTLES DRAWN AEROBIC AND ANAEROBIC Blood Culture adequate volume   Culture   Final    NO GROWTH 5 DAYS Performed at [facility], [address], [place], [place] [address]    Report Status 07/30/2019 FINAL  Final  SARS CORONAVIRUS 2 (TAT 6-24 HRS) Nasopharyngeal Nasopharyngeal Swab     Status: None  Collection Time: 07/25/19  7:13 PM   Specimen: Nasopharyngeal Swab  Result Value Ref Range Status   SARS Coronavirus 2 NEGATIVE NEGATIVE Final    Comment: (NOTE) SARS-CoV-2 target nucleic acids are NOT DETECTED. The SARS-CoV-2 RNA is generally detectable in upper and lower respiratory specimens during the acute phase of infection. Negative results do not preclude SARS-CoV-2 infection, do not rule out co-infections with other pathogens, and should not be used as the sole basis for treatment or other patient management decisions. Negative results must be combined with clinical observations, patient history, and epidemiological information. The expected result is Negative. Fact Sheet for Patients: SugarRoll.be Fact Sheet for Healthcare Providers: https://www.woods-mathews.com/ This test is not yet approved or cleared by the [place] FDA and  has been authorized for detection and/or diagnosis of SARS-CoV-2 by FDA under an Emergency Use Authorization (EUA). This EUA will remain  in effect (meaning this test can be used) for the duration of the COVID-19 declaration under Section 56 4(b)(1) of the Act, 21 U.S.C. section 360bbb-3(b)(1), unless the  authorization is terminated or revoked sooner. Performed at [facility], [place] [address] [place], [place] [address]          Radiology Studies: DG CHEST PORT 1 VIEW  Result Date: 07/31/2019 CLINICAL DATA:  Cough. EXAM: PORTABLE CHEST 1 VIEW COMPARISON:  07/25/2019. FINDINGS: Cardiac pacer with lead tip over the right ventricle. Cardiomegaly. Diffuse bilateral pulmonary infiltrates/edema and bilateral pleural effusions. Findings have from prior exam. No pneumothorax. Sclerotic changes right proximal humerus again noted consistent old infarct. Degenerative changes left shoulder and thoracic spine again noted. IMPRESSION: 1. Cardiac pacer with lead tip over the right ventricle in stable position. Cardiomegaly. 2. Diffuse bilateral pulmonary infiltrates/edema bilateral pleural effusions. Findings suggest CHF. Bilateral pneumonia cannot be excluded. Findings have progressed from prior exam. Electronically Signed   By: [place]   On: 07/31/2019 15:14        Scheduled Meds: . acetylcysteine  4 mL Nebulization BID  . arformoterol  15 mcg Nebulization BID  . budesonide (PULMICORT) nebulizer solution  0.5 mg Nebulization BID  . dextromethorphan-guaiFENesin  1 tablet Oral BID  . diltiazem  120 mg Oral Daily  . feeding supplement (PRO-STAT SUGAR FREE 64)  30 mL Oral BID  . ferrous sulfate  325 mg Oral Q breakfast  . furosemide  40 mg Intravenous Q12H  . insulin aspart  0-20 Units Subcutaneous TID WC  . insulin aspart  0-5 Units Subcutaneous QHS  . insulin aspart  3 Units Subcutaneous TID WC  . insulin glargine  7 Units Subcutaneous QHS  . lidocaine  1 patch Transdermal Q24H  . lipase/protease/amylase  36,000 Units Oral TID WC  . methylPREDNISolone (SOLU-MEDROL) injection  40 mg Intravenous Q12H  . multivitamin-lutein  1 capsule Oral Daily  . pantoprazole  40 mg Oral BID  . polyethylene glycol  17 g Oral Daily  . potassium chloride SA  20 mEq Oral Daily  . simvastatin  10  mg Oral Daily  . sucralfate  1 g Oral TID WC & HS  . warfarin  1 mg Oral q1800  . Warfarin - Pharmacist Dosing Inpatient   Does not apply q1800   Continuous Infusions:   LOS: 7 days    Time spent: 30 minutes    Cristen Murcia Darleen Crocker, DO [facility] Pager [phone]  If 7PM-7AM, please contact night-coverage [email or web address] Password [facility] 08/01/2019, 9:47 AM

## 2019-08-01 NOTE — TOC Progression Note (Addendum)
Transition of Care [facility]) - Progression Note    Patient Details  Name: [name] MRN: [id number] Date of Birth: [birth year]  [facility] CM/SW Contact  Ihor Gully, LCSW Phone Number: 08/01/2019, 1:45 PM  Clinical Narrative:    Spoke with Patient's granddaughter, Cristy Hilts, regarding PT recommendations. Ms. Tamala Julian to discuss SNF with patient's daughter and son and return contact to LCSW. Return contact received from [place] who advised that the family did not desire SNF at this time with the holidays approaching. Ms. Tamala Julian states that patient lives with her and that she is a CMA. Request HH. Discussed HH choices. Patient previously active with [facility].  Romualdo Bolk at [facility] agreeable for patient to be readmitted for RN,PT. Cannot start care until 12/27.   Expected Discharge Plan: [place] Barriers to Discharge: Continued Medical Work up  Expected Discharge Plan and Services Expected Discharge Plan: [place] Determinants of Health (SDOH) Interventions    Readmission Risk Interventions Readmission Risk Prevention Plan 07/28/2019 05/14/2019 04/27/2019  Transportation Screening Complete Complete -  PCP or Specialist Appt within 3-5 Days - Complete Complete  HRI or Home Care Consult - Complete Complete  Social Work Consult for [place] Planning/Counseling - Complete -  Palliative Care Screening - Not Applicable -  Medication Review Press photographer) Complete Complete -  PCP or Specialist appointment within 3-5 days of discharge Not Complete - -  [place] or Home Care Consult Complete - -  Palliative Care Screening Not Complete - -  [place] Not Complete - -  Some recent data might be hidden

## 2019-08-01 NOTE — Progress Notes (Signed)
ANTICOAGULATION CONSULT NOTE -   Pharmacy Consult for warfarin dosing  Indication: atrial fibrillation   Allergies  Allergen Reactions  . Chlorhexidine   . Macrobid [Nitrofurantoin Monohyd Macro] Rash  . Torsemide Nausea And Vomiting  . Tramadol Nausea Only      Patient Measurements: Last Weight  Most recent update: 08/01/2019  5:23 AM   Weight  68.5 kg (151 lb 0.2 oz)           Body mass index is 27.62 kg/m. [name]               Temp: 97.9 F (36.6 C) (12/22 0610) Temp Source: Oral (12/22 0610) BP: 135/88 (12/22 0610) Pulse Rate: 102 (12/22 0610)  Labs: Recent Labs    07/30/19 0704 07/31/19 0523 08/01/19 0533  HGB 10.4* 10.7*  --   HCT 35.3* 35.8*  --   PLT 314 315  --   LABPROT 26.7* 26.5* 25.8*  INR 2.5* 2.5* 2.4*  CREATININE 1.49* 1.24* 1.15*    Estimated Creatinine Clearance: 34.2 mL/min (A) (by C-G formula based on SCr of 1.15 mg/dL (H)).  Goal of Therapy:  INR 2-3 Monitor CBC     Prior to Admission Warfarin Dosing:  [name] takes 1mg  of warfarin daily    Admit INR was 2.0 Lab Results  Component Value Date   INR 2.4 (H) 08/01/2019   INR 2.5 (H) 07/31/2019   INR 2.5 (H) 07/30/2019    Assessment: [name] a 84 y.o. female requires anticoagulation with warfarin for the indication of  Afib. Potential for drug drug interaction with azithromycin, will monitor closely. Warfarin will be initiated inpatient following pharmacy protocol per pharmacy consult. Patient most recent blood work is as follows:   CBC Latest Ref Rng & Units 07/31/2019 07/30/2019 07/29/2019  WBC 4.0 - 10.5 K/uL 10.6(H) 9.9 9.2  Hemoglobin 12.0 - 15.0 g/dL 10.7(L) 10.4(L) 9.7(L)  Hematocrit 36.0 - 46.0 % 35.8(L) 35.3(L) 32.3(L)  Platelets 150 - 400 K/uL 315 314 274     Plan: Continue Warfarin 1mg  po daily  Monitor CBC qmwf with am labs   Monitor INR daily Monitor for signs and symptoms of bleeding   [name], PharmD Clinical  Pharmacist 08/01/2019 7:54 AM

## 2019-08-01 NOTE — Telephone Encounter (Signed)
LMOVM

## 2019-08-02 LAB — CBC
HCT: 36.6 % (ref 36.0–46.0)
Hemoglobin: 11.2 g/dL — ABNORMAL LOW (ref 12.0–15.0)
MCH: 32.8 pg (ref 26.0–34.0)
MCHC: 30.6 g/dL (ref 30.0–36.0)
MCV: 107.3 fL — ABNORMAL HIGH (ref 80.0–100.0)
Platelets: 286 10*3/uL (ref 150–400)
RBC: 3.41 MIL/uL — ABNORMAL LOW (ref 3.87–5.11)
RDW: 14.6 % (ref 11.5–15.5)
WBC: 12.4 10*3/uL — ABNORMAL HIGH (ref 4.0–10.5)
nRBC: 0 % (ref 0.0–0.2)

## 2019-08-02 LAB — GLUCOSE, CAPILLARY
Glucose-Capillary: 148 mg/dL — ABNORMAL HIGH (ref 70–99)
Glucose-Capillary: 175 mg/dL — ABNORMAL HIGH (ref 70–99)
Glucose-Capillary: 177 mg/dL — ABNORMAL HIGH (ref 70–99)
Glucose-Capillary: 99 mg/dL (ref 70–99)

## 2019-08-02 LAB — BASIC METABOLIC PANEL
Anion gap: 11 (ref 5–15)
BUN: 62 mg/dL — ABNORMAL HIGH (ref 8–23)
CO2: 27 mmol/L (ref 22–32)
Calcium: 9.7 mg/dL (ref 8.9–10.3)
Chloride: 101 mmol/L (ref 98–111)
Creatinine, Ser: 1.2 mg/dL — ABNORMAL HIGH (ref 0.44–1.00)
GFR calc Af Amer: 49 mL/min — ABNORMAL LOW (ref >60)
GFR calc non Af Amer: 42 mL/min — ABNORMAL LOW (ref >60)
Glucose, Bld: 147 mg/dL — ABNORMAL HIGH (ref 70–99)
Potassium: 5 mmol/L (ref 3.5–5.1)
Sodium: 139 mmol/L (ref 135–145)

## 2019-08-02 LAB — PROTIME-INR
INR: 2.4 — ABNORMAL HIGH (ref 0.8–1.2)
Prothrombin Time: 26.4 seconds — ABNORMAL HIGH (ref 11.4–15.2)

## 2019-08-02 LAB — MAGNESIUM: Magnesium: 2 mg/dL (ref 1.7–2.4)

## 2019-08-02 MED ORDER — BISACODYL 10 MG RE SUPP
10.0000 mg | Freq: Once | RECTAL | Status: AC
  Administered 2019-08-02: 10 mg via RECTAL
  Filled 2019-08-02: qty 1

## 2019-08-02 NOTE — Progress Notes (Signed)
Physical Therapy Treatment Patient Details Name: [name] MRN: [id number] DOB: 12/01/1934 Today's Date: 08/02/2019    History of Present Illness Maurina [name]  is a 84 y.o. female,  w hypertension, hyperlipidemia, Dm2, CKD stage 3, Anemia, h/o pacer, Pafib, CAD, mild- mod Aortic regurgitation, mild Mitral regurgitation, CHF (EF 55-60%) , h/o  Covid-19 infection, Copd, c/o increase in dyspnea for the past 5 days,  Pt has had cough w grey sputum, pt denies fever, chills, cp, palp, n/v, abd pain, diarrhea, brbpr, black stool,    PT Comments    Patient demonstrates increased endurance/distance for taking steps forward/backwards at bedside with slow  labored cadence.  Patient tolerated sitting up at bedside for approximately 25 minutes while completing BLE ROM/strenghtening exercises, limited mostly due to c/o right flank pain with occasional guarding by placing hands on right side.  Patient declined to sit up in chair and stated she had been up in chair earlier today with nursing staff assisting.  Patient required Mod assistance to reposition when put back to bed.  Patient will benefit from continued physical therapy in hospital and recommended venue below to increase strength, balance, endurance for safe ADLs and gait.   Follow Up Recommendations  SNF     Equipment Recommendations  None recommended by PT    Recommendations for Other Services       Precautions / Restrictions Precautions Precautions: Fall Precaution Comments: Home O2 dependent Restrictions Weight Bearing Restrictions: No    Mobility  Bed Mobility Overal bed mobility: Needs Assistance Bed Mobility: Supine to Sit;Sit to Supine     Supine to sit: Min assist Sit to supine: Min assist   General bed mobility comments: slow labored movement with fair/good return for propping up on elbows  Transfers Overall transfer level: Needs assistance Equipment used: Rolling walker (2 wheeled) Transfers: Sit to/from  [facility] Sit to Stand: Min assist Stand pivot transfers: Min assist       General transfer comment: slow unsteady movement, difficulty with sit to stands due to BLE weakness  Ambulation/Gait Ambulation/Gait assistance: Min assist;Mod assist Gait Distance (Feet): 10 Feet Assistive device: Rolling walker (2 wheeled) Gait Pattern/deviations: Decreased step length - right;Decreased step length - left;Decreased stride length;Trunk flexed Gait velocity: decreased   General Gait Details: limited to 10 feet stepping forward/backwards at bedside demonstrating slow labored cadence without loss of balance, limited secondary to c/o fatigue   Stairs             Wheelchair Mobility    Modified Rankin (Stroke Patients Only)       Balance Overall balance assessment: Needs assistance Sitting-balance support: Feet supported;No upper extremity supported Sitting balance-Leahy Scale: Fair Sitting balance - Comments: fair/good seated at EOB   Standing balance support: Bilateral upper extremity supported;During functional activity Standing balance-Leahy Scale: Poor Standing balance comment: fair/poor using RW                            Cognition Arousal/Alertness: Awake/alert Behavior During Therapy: WFL for tasks assessed/performed Overall Cognitive Status: Within Functional Limits for tasks assessed                                        Exercises General Exercises - Lower Extremity Long Arc Quad: Seated;AROM;Strengthening;Both;10 reps Hip ABduction/ADduction: Seated;AROM;Strengthening;Both;10 reps Hip Flexion/Marching: Seated;AROM;Strengthening;Both;10 reps Toe Raises: Seated;AROM;Strengthening;Both;10 reps Heel Raises: Seated;AROM;Strengthening;Both;10  reps    General Comments        Pertinent Vitals/Pain Pain Assessment: Faces Faces Pain Scale: Hurts even more Pain Location: right side abdomen/flank Pain Descriptors /  Indicators: Sore;Discomfort;Grimacing;Guarding Pain Intervention(s): Limited activity within patient's tolerance;Monitored during session;Patient requesting pain meds-RN notified    Home Living                      Prior Function            PT Goals (current goals can now be found in the care plan section) Acute Rehab PT Goals Patient Stated Goal: Return home with family PT Goal Formulation: With patient Time For Goal Achievement: 08/11/19 Potential to Achieve Goals: Good Progress towards PT goals: Progressing toward goals    Frequency    Min 3X/week      PT Plan Current plan remains appropriate    Co-evaluation              AM-PAC PT "6 Clicks" Mobility   Outcome Measure  Help needed turning from your back to your side while in a flat bed without using bedrails?: A Little Help needed moving from lying on your back to sitting on the side of a flat bed without using bedrails?: A Little Help needed moving to and from a bed to a chair (including a wheelchair)?: A Lot Help needed standing up from a chair using your arms (e.g., wheelchair or bedside chair)?: A Little Help needed to walk in hospital room?: A Lot Help needed climbing 3-5 steps with a railing? : A Lot 6 Click Score: 15    End of Session Equipment Utilized During Treatment: Oxygen Activity Tolerance: Patient tolerated treatment well;Patient limited by fatigue;Patient limited by pain Patient left: with call bell/phone within reach;in bed;with bed alarm set Nurse Communication: Mobility status PT Visit Diagnosis: Unsteadiness on feet (R26.81);Other abnormalities of gait and mobility (R26.89);Muscle weakness (generalized) (M62.81)     Time: 3244-0102 PT Time Calculation (min) (ACUTE ONLY): 30 min  Charges:  $Therapeutic Exercise: 8-22 mins $Therapeutic Activity: 8-22 mins                     3:21 PM, 08/02/19 Lonell Grandchild, MPT Physical Therapist with [facility] 336  [phone] office [phone] mobile phone

## 2019-08-02 NOTE — Progress Notes (Signed)
ANTICOAGULATION CONSULT NOTE -   Pharmacy Consult for warfarin dosing  Indication: atrial fibrillation   Allergies  Allergen Reactions  . Chlorhexidine   . Macrobid [Nitrofurantoin Monohyd Macro] Rash  . Torsemide Nausea And Vomiting  . Tramadol Nausea Only      Patient Measurements: Last Weight  Most recent update: 08/02/2019  5:33 AM   Weight  65.2 kg (143 lb 11.8 oz)           Body mass index is 26.29 kg/m. [name]               Temp: 98 F (36.7 C) (12/23 0609) Temp Source: Oral (12/23 0609) BP: 136/72 (12/23 0609) Pulse Rate: 97 (12/23 0609)  Labs: Recent Labs    07/31/19 0523 08/01/19 0533 08/02/19 0542  HGB 10.7*  --  11.2*  HCT 35.8*  --  36.6  PLT 315  --  286  LABPROT 26.5* 25.8* 26.4*  INR 2.5* 2.4* 2.4*  CREATININE 1.24* 1.15* 1.20*    Estimated Creatinine Clearance: 32 mL/min (A) (by C-G formula based on SCr of 1.2 mg/dL (H)).  Goal of Therapy:  INR 2-3 Monitor CBC     Prior to Admission Warfarin Dosing:  [name] takes 1mg  of warfarin daily    Admit INR was 2.0 Lab Results  Component Value Date   INR 2.4 (H) 08/02/2019   INR 2.4 (H) 08/01/2019   INR 2.5 (H) 07/31/2019    Assessment: [name] a 84 y.o. female requires anticoagulation with warfarin for the indication of  Afib. Potential for drug drug interaction with azithromycin, will monitor closely. Warfarin will be initiated inpatient following pharmacy protocol per pharmacy consult. Patient most recent blood work is as follows:   CBC Latest Ref Rng & Units 08/02/2019 07/31/2019 07/30/2019  WBC 4.0 - 10.5 K/uL 12.4(H) 10.6(H) 9.9  Hemoglobin 12.0 - 15.0 g/dL 11.2(L) 10.7(L) 10.4(L)  Hematocrit 36.0 - 46.0 % 36.6 35.8(L) 35.3(L)  Platelets 150 - 400 K/uL 286 315 314     Plan: Continue Warfarin 1mg  po daily  Monitor CBC qmwf with am labs   Monitor INR daily Monitor for signs and symptoms of bleeding   Isac Sarna, BS Vena [place], BCPS Clinical  Pharmacist Pager [phone] 08/02/2019 11:36 AM

## 2019-08-02 NOTE — Care Management Important Message (Signed)
Important Message  Patient Details  Name: [name] MRN: [id number] Date of Birth: Sep 13, 1934   Medicare Important Message Given:  Yes     Ihor Gully, LCSW 08/02/2019, 12:08 PM

## 2019-08-02 NOTE — Progress Notes (Signed)
PROGRESS NOTE    [name]  ZOX:[id number] DOB: [birth year] DOA: 07/25/2019 PCP: [name], FNP   Brief Narrative:  As per H&P written by Dr. [name] on 07/25/2019 84 y.o.female,w hypertension, hyperlipidemia, Dm2, CKD stage 3, Anemia, h/o pacer, Pafib, CAD, mild- mod Aortic regurgitation, mild Mitral regurgitation, CHF (EF 55-60%) , h/o Covid-19 infection, Copd, c/o increase in dyspnea for the past 5 days, Pt has had cough w grey sputum, pt denies fever, chills, cp, palp, n/v, abd pain, diarrhea, brbpr, black stool,   In ED,  T 98.5, P 64, R 16, Bp 119/66 pox 95%on 4L [place] WT 46.3  CXR IMPRESSION: 1. Low lung volumes with patchy mixed interstitial and airspace opacity with a basilar predominance. Findings are suspicious for multifocal pneumonia or aspiration though mixed interstitial and alveolar edema could have a similar appearance 2. Small right pleural effusion. 3. Stable cardiomegaly.  Na 142, K 3.7, Bun 34, creatinine 1.61 ASt 24, Alt 14 Wbc 7.5, Hgb 8.7, Plt 242 Trop 20 -> 18 BNP 790   COVID test negative  12/18:Patient continues to have significant hypoxemia and remains on 8 L nasal cannula. She also appears quite weak. We will plan to wean oxygen today and attempt a voiding trial with discontinuation of Foley catheter. PT evaluation will be necessary prior to discharge. Continue on Rocephin and azithromycin for multifocal pneumonia. Follow a.m. labs.  12/19: Patient is currently on 5 L nasal cannula oxygen. She still appears quite weak. PT recommending SNF on discharge. Continuing azithromycin and Rocephin for pneumonia.  12/20:Patient is currently on 3-4 L nasal cannula oxygen. She continues to remain weak with PT recommending SNF on discharge. Her baseline is typically 2 L nasal cannula oxygen.  12/21:Today patient is having ongoing cough with sputum production and is back to 6 L nasal cannula oxygen. We will plan to repeat  chest x-ray and start Mucomyst today. She overall has net negative fluid balance of 1.58 L and creatinine has improved.  12/22: Patient appears to have worsening edema noted on chest x-ray 12/21.  Her oxygen requirements have increased to 8 L this morning.  We will plan to diurese further with Lasix 40 mg twice daily.  Start MiraLAX for bowel movement as well.  Wean oxygen as tolerated.  Maintain on Foley catheter given urinary retention issues.  Requires SNF/rehab on discharge and patient appears agreeable.  12/23: Patient is diuresing quite well on Lasix with over 3 L diuresed yesterday.  We will continue the same for now and try suppository for bowel movement today.  She is currently on 6 L nasal cannula and will continue the weaning process.  Assessment & Plan:   Principal Problem:   Pneumonia Active Problems:   Type 2 diabetes mellitus with kidney complication, without long-term current use of insulin (HCC)   Essential hypertension   COPD GOLD O    COPD with acute exacerbation (HCC)   CKD (chronic kidney disease), stage III (HCC)   1-acute on chronic respiratory failure in the setting of multifocal pneumonia, COPD exacerbation and CHF exacerbation -Patient heart failure exacerbation is acute on chronic diastolic heart failure.  Repeat chest x-ray on 12/21 demonstrates increasing pulmonary edema.  Continue Lasix 40 mg IV twice daily today. -Continue daily weightsand low-sodium diet -Follow strict I's and O's. -For her COPD will continue the use of IV steroidswhich will be weaned to 40 mg2times daily, start patient on Brovana, Pulmicort and as needed bronchodilators. -Discontinued antibiotics on 12/21 -Wean off oxygen supplementation as  tolerated (patient at baseline using 2-3 L).Currently on6Lnasal cannula. -Start Mucomyst for chest congestion and continue flutter valve  2-chronic atrial fibrillation -Rate control currently -Continue Coumadin per pharmacy -Continue  Cardizem -Monitoring on telemetry.  3-Type 2 diabetes mellitus with kidney complication, without long-term current use of insulin (HCC) -With noted steroid-induced hyperglycemia. -While inpatient will hold oral hypoglycemic agents -Continue sliding scale insulin and Lantuswith doses that have been increased along with mealtime insulins that have been added.  4-Essential hypertension -Stable overall -Continue current antihypertensive regimen.  5-chronic kidney disease is stage III b. -Appears to be stable -Close monitoring of electrolytes and renal function in the setting of acute IV diuresis.  6-gastroesophageal reflux disease -Continue PPI and Carafate.  7-history of chronic pancreatitis -Continue Creon -Patient reports no abdominal pain, nausea or vomiting currently.  8-hyperlipidemia -Continue statins  9-urinary retention -Bladder scan demonstrated over 800 mL retained -Foley catheterremoved on 12/18, but failed voiding trial andtherefore, will keep in place until discharge  DVT prophylaxis:Chronically on Coumadinwith therapeutic INR Code Status:Full code Family Communication:No family at bedside, tried calling daughtermultiple timeswith no response Disposition Plan:Remains inpatient, continue treatment for CHF and COPD exacerbation; negative Covid 19 test; safe to use nebulizer management.Continue Foley catheter until discharge. PT indicating need for SNF on discharge, but patient would like to go home with home health PT. Resume Lasix IV twice daily on 12/21 and wean oxygen to baseline of 2 L as tolerated.  Consultants:  None  Procedures:  See below for x-ray reports  Antimicrobials:  Anti-infectives (From admission, onward)   Start     Dose/Rate Route Frequency Ordered Stop   07/25/19 1830  cefTRIAXone (ROCEPHIN) 2 g in sodium chloride 0.9 % 100 mL IVPB     2 g 200 mL/hr over 30 Minutes Intravenous Every 24 hours 07/25/19 1826 07/29/19  2146   07/25/19 1830  azithromycin (ZITHROMAX) 500 mg in sodium chloride 0.9 % 250 mL IVPB     500 mg 250 mL/hr over 60 Minutes Intravenous Every 24 hours 07/25/19 1826 07/29/19 2316       Subjective: Patient seen and evaluated today with no new acute complaints or concerns. No acute concerns or events noted overnight.  She remains on 6 L nasal cannula oxygen and has been diuresing well.  No bowel movement noted as of yet.  Objective: Vitals:   08/01/19 2214 08/02/19 0500 08/02/19 0609 08/02/19 0743  BP: 134/73  136/72   Pulse: 96  97   Resp: 20  (!) 22   Temp: 97.8 F (36.6 C)  98 F (36.7 C)   TempSrc: Oral  Oral   SpO2: 98%  100% 100%  Weight:  65.2 kg    Height:        Intake/Output Summary (Last 24 hours) at 08/02/2019 1234 Last data filed at 08/02/2019 0942 Gross per 24 hour  Intake 440 ml  Output 4500 ml  Net -4060 ml   Filed Weights   07/31/19 0500 08/01/19 0500 08/02/19 0500  Weight: 68.9 kg 68.5 kg 65.2 kg    Examination:  General exam: Appears calm and comfortable  Respiratory system: Clear to auscultation. Respiratory effort normal.  Currently on 6 L nasal cannula oxygen. Cardiovascular system: S1 & S2 heard, RRR. No JVD, murmurs, rubs, gallops or clicks. No pedal edema. Gastrointestinal system: Abdomen is nondistended, soft and nontender. No organomegaly or masses felt. Normal bowel sounds heard. Central nervous system: Alert and oriented. No focal neurological deficits. Extremities: Symmetric 5 x 5 power. Skin:  No rashes, lesions or ulcers Psychiatry: Judgement and insight appear normal. Mood & affect appropriate.     Data Reviewed: I have personally reviewed following labs and imaging studies  CBC: Recent Labs  Lab 07/28/19 0532 07/29/19 0706 07/30/19 0704 07/31/19 0523 08/02/19 0542  WBC 12.7* 9.2 9.9 10.6* 12.4*  HGB 9.7* 9.7* 10.4* 10.7* 11.2*  HCT 32.9* 32.3* 35.3* 35.8* 36.6  MCV 108.9* 109.1* 108.3* 108.2* 107.3*  PLT 314 274 314  315 630   Basic Metabolic Panel: Recent Labs  Lab 07/29/19 0706 07/30/19 0704 07/31/19 0523 08/01/19 0533 08/02/19 0542  NA 141 141 140 140 139  K 4.5 4.6 4.8 4.8 5.0  CL 109 105 104 107 101  CO2 26 25 25 23 27   GLUCOSE 163* 149* 159* 196* 147*  BUN 72* 68* 63* 61* 62*  CREATININE 1.61* 1.49* 1.24* 1.15* 1.20*  CALCIUM 9.2 9.2 9.5 9.5 9.7  MG  --   --   --   --  2.0   GFR: Estimated Creatinine Clearance: 32 mL/min (A) (by C-G formula based on SCr of 1.2 mg/dL (H)). Liver Function Tests: No results for input(s): AST, ALT, ALKPHOS, BILITOT, PROT, ALBUMIN in the last 168 hours. No results for input(s): LIPASE, AMYLASE in the last 168 hours. No results for input(s): AMMONIA in the last 168 hours. Coagulation Profile: Recent Labs  Lab 07/29/19 0706 07/30/19 0704 07/31/19 0523 08/01/19 0533 08/02/19 0542  INR 2.5* 2.5* 2.5* 2.4* 2.4*   Cardiac Enzymes: No results for input(s): CKTOTAL, CKMB, CKMBINDEX, TROPONINI in the last 168 hours. BNP (last 3 results) No results for input(s): PROBNP in the last 8760 hours. HbA1C: No results for input(s): HGBA1C in the last 72 hours. CBG: Recent Labs  Lab 08/01/19 1323 08/01/19 1636 08/01/19 2143 08/02/19 0744 08/02/19 1101  GLUCAP 236* 153* 171* 148* 175*   Lipid Profile: No results for input(s): CHOL, HDL, LDLCALC, TRIG, CHOLHDL, LDLDIRECT in the last 72 hours. Thyroid Function Tests: No results for input(s): TSH, T4TOTAL, FREET4, T3FREE, THYROIDAB in the last 72 hours. Anemia Panel: No results for input(s): VITAMINB12, FOLATE, FERRITIN, TIBC, IRON, RETICCTPCT in the last 72 hours. Sepsis Labs: No results for input(s): PROCALCITON, LATICACIDVEN in the last 168 hours.  Recent Results (from the past 240 hour(s))  Culture, blood (Routine X 2) w Reflex to ID Panel     Status: None   Collection Time: 07/25/19  5:54 PM   Specimen: Right Antecubital; Blood  Result Value Ref Range Status   Specimen Description RIGHT  ANTECUBITAL  Final   Special Requests   Final    BOTTLES DRAWN AEROBIC AND ANAEROBIC Blood Culture adequate volume   Culture   Final    NO GROWTH 5 DAYS Performed at [facility], [address] [place], [place] [address]    Report Status 07/30/2019 FINAL  Final  Culture, blood (Routine X 2) w Reflex to ID Panel     Status: None   Collection Time: 07/25/19  7:09 PM   Specimen: BLOOD LEFT WRIST  Result Value Ref Range Status   Specimen Description BLOOD LEFT WRIST  Final   Special Requests   Final    BOTTLES DRAWN AEROBIC AND ANAEROBIC Blood Culture adequate volume   Culture   Final    NO GROWTH 5 DAYS Performed at [facility], [address] [place], [place] [address]    Report Status 07/30/2019 FINAL  Final  SARS CORONAVIRUS 2 (TAT 6-24 HRS) Nasopharyngeal Nasopharyngeal Swab  Status: None   Collection Time: 07/25/19  7:13 PM   Specimen: Nasopharyngeal Swab  Result Value Ref Range Status   SARS Coronavirus 2 NEGATIVE NEGATIVE Final    Comment: (NOTE) SARS-CoV-2 target nucleic acids are NOT DETECTED. The SARS-CoV-2 RNA is generally detectable in upper and lower respiratory specimens during the acute phase of infection. Negative results do not preclude SARS-CoV-2 infection, do not rule out co-infections with other pathogens, and should not be used as the sole basis for treatment or other patient management decisions. Negative results must be combined with clinical observations, patient history, and epidemiological information. The expected result is Negative. Fact Sheet for Patients: SugarRoll.be Fact Sheet for Healthcare Providers: https://www.woods-mathews.com/ This test is not yet approved or cleared by the [place] FDA and  has been authorized for detection and/or diagnosis of SARS-CoV-2 by FDA under an Emergency Use Authorization (EUA). This EUA will remain  in effect (meaning this test can be used) for the duration of the  COVID-19 declaration under Section 56 4(b)(1) of the Act, 21 U.S.C. section 360bbb-3(b)(1), unless the authorization is terminated or revoked sooner. Performed at [facility], [place] [address], [place], [place] [address]          Radiology Studies: DG CHEST PORT 1 VIEW  Result Date: 07/31/2019 CLINICAL DATA:  Cough. EXAM: PORTABLE CHEST 1 VIEW COMPARISON:  07/25/2019. FINDINGS: Cardiac pacer with lead tip over the right ventricle. Cardiomegaly. Diffuse bilateral pulmonary infiltrates/edema and bilateral pleural effusions. Findings have from prior exam. No pneumothorax. Sclerotic changes right proximal humerus again noted consistent old infarct. Degenerative changes left shoulder and thoracic spine again noted. IMPRESSION: 1. Cardiac pacer with lead tip over the right ventricle in stable position. Cardiomegaly. 2. Diffuse bilateral pulmonary infiltrates/edema bilateral pleural effusions. Findings suggest CHF. Bilateral pneumonia cannot be excluded. Findings have progressed from prior exam. Electronically Signed   By: [place]   On: 07/31/2019 15:14        Scheduled Meds: . acetylcysteine  4 mL Nebulization BID  . arformoterol  15 mcg Nebulization BID  . bisacodyl  10 mg Rectal Once  . budesonide (PULMICORT) nebulizer solution  0.5 mg Nebulization BID  . dextromethorphan-guaiFENesin  1 tablet Oral BID  . diltiazem  120 mg Oral Daily  . feeding supplement (PRO-STAT SUGAR FREE 64)  30 mL Oral BID  . ferrous sulfate  325 mg Oral Q breakfast  . furosemide  40 mg Intravenous Q12H  . insulin aspart  0-20 Units Subcutaneous TID WC  . insulin aspart  0-5 Units Subcutaneous QHS  . insulin aspart  3 Units Subcutaneous TID WC  . insulin glargine  7 Units Subcutaneous QHS  . lidocaine  1 patch Transdermal Q24H  . lipase/protease/amylase  36,000 Units Oral TID WC  . methylPREDNISolone (SOLU-MEDROL) injection  40 mg Intravenous Q12H  . multivitamin-lutein  1 capsule Oral Daily   . pantoprazole  40 mg Oral BID  . polyethylene glycol  17 g Oral Daily  . potassium chloride SA  20 mEq Oral Daily  . simvastatin  10 mg Oral Daily  . sucralfate  1 g Oral TID WC & HS  . warfarin  1 mg Oral q1800  . Warfarin - Pharmacist Dosing Inpatient   Does not apply q1800   Continuous Infusions:   LOS: 8 days    Time spent: 30 minutes    Cathlin Buchan Darleen Crocker, DO [facility] Pager [phone]  If 7PM-7AM, please contact night-coverage [email or web address] Password [facility] 08/02/2019, 12:34  PM

## 2019-08-03 ENCOUNTER — Telehealth: Payer: Self-pay

## 2019-08-03 LAB — GLUCOSE, CAPILLARY
Glucose-Capillary: 136 mg/dL — ABNORMAL HIGH (ref 70–99)
Glucose-Capillary: 126 mg/dL — ABNORMAL HIGH (ref 70–99)
Glucose-Capillary: 194 mg/dL — ABNORMAL HIGH (ref 70–99)
Glucose-Capillary: 194 mg/dL — ABNORMAL HIGH (ref 70–99)

## 2019-08-03 LAB — CBC
HCT: 35.9 % — ABNORMAL LOW (ref 36.0–46.0)
Hemoglobin: 11 g/dL — ABNORMAL LOW (ref 12.0–15.0)
MCH: 32.1 pg (ref 26.0–34.0)
MCHC: 30.6 g/dL (ref 30.0–36.0)
MCV: 104.7 fL — ABNORMAL HIGH (ref 80.0–100.0)
Platelets: 277 10*3/uL (ref 150–400)
RBC: 3.43 MIL/uL — ABNORMAL LOW (ref 3.87–5.11)
RDW: 14.5 % (ref 11.5–15.5)
WBC: 16.2 10*3/uL — ABNORMAL HIGH (ref 4.0–10.5)
nRBC: 0 % (ref 0.0–0.2)

## 2019-08-03 LAB — BASIC METABOLIC PANEL
Anion gap: 12 (ref 5–15)
BUN: 63 mg/dL — ABNORMAL HIGH (ref 8–23)
CO2: 27 mmol/L (ref 22–32)
Calcium: 9.4 mg/dL (ref 8.9–10.3)
Chloride: 99 mmol/L (ref 98–111)
Creatinine, Ser: 1.24 mg/dL — ABNORMAL HIGH (ref 0.44–1.00)
GFR calc Af Amer: 47 mL/min — ABNORMAL LOW (ref >60)
GFR calc non Af Amer: 40 mL/min — ABNORMAL LOW (ref >60)
Glucose, Bld: 134 mg/dL — ABNORMAL HIGH (ref 70–99)
Potassium: 4.4 mmol/L (ref 3.5–5.1)
Sodium: 138 mmol/L (ref 135–145)

## 2019-08-03 LAB — PROTIME-INR
INR: 2.5 — ABNORMAL HIGH (ref 0.8–1.2)
Prothrombin Time: 26.9 seconds — ABNORMAL HIGH (ref 11.4–15.2)

## 2019-08-03 LAB — MAGNESIUM: Magnesium: 2 mg/dL (ref 1.7–2.4)

## 2019-08-03 NOTE — Progress Notes (Signed)
ANTICOAGULATION CONSULT NOTE -   Pharmacy Consult for warfarin dosing  Indication: atrial fibrillation   Allergies  Allergen Reactions  . Chlorhexidine   . Macrobid [Nitrofurantoin Monohyd Macro] Rash  . Torsemide Nausea And Vomiting  . Tramadol Nausea Only      Patient Measurements: Last Weight  Most recent update: 08/03/2019  6:11 AM   Weight  63.8 kg (140 lb 10.5 oz)           Body mass index is 25.73 kg/m. [name]               Temp: 97.8 F (36.6 C) (12/24 0551) Temp Source: Oral (12/24 0551) BP: 129/71 (12/24 0551) Pulse Rate: 95 (12/24 0551)  Labs: Recent Labs    08/01/19 0533 08/02/19 0542 08/03/19 0606  HGB  --  11.2* 11.0*  HCT  --  36.6 35.9*  PLT  --  286 277  LABPROT 25.8* 26.4* 26.9*  INR 2.4* 2.4* 2.5*  CREATININE 1.15* 1.20* 1.24*    Estimated Creatinine Clearance: 30.7 mL/min (A) (by C-G formula based on SCr of 1.24 mg/dL (H)).  Goal of Therapy:  INR 2-3 Monitor CBC     Prior to Admission Warfarin Dosing:  [name] takes 1mg  of warfarin daily    Admit INR was 2.0 Lab Results  Component Value Date   INR 2.5 (H) 08/03/2019   INR 2.4 (H) 08/02/2019   INR 2.4 (H) 08/01/2019    Assessment: [name] a 84 y.o. female requires anticoagulation with warfarin for the indication of  Afib. Potential for drug drug interaction with azithromycin, will monitor closely. Warfarin will be initiated inpatient following pharmacy protocol per pharmacy consult. Patient most recent blood work is as follows:   CBC Latest Ref Rng & Units 08/03/2019 08/02/2019 07/31/2019  WBC 4.0 - 10.5 K/uL 16.2(H) 12.4(H) 10.6(H)  Hemoglobin 12.0 - 15.0 g/dL 11.0(L) 11.2(L) 10.7(L)  Hematocrit 36.0 - 46.0 % 35.9(L) 36.6 35.8(L)  Platelets 150 - 400 K/uL 277 286 315     Plan: Continue Warfarin 1mg  po daily  Monitor CBC qmwf with am labs   Monitor INR daily Monitor for signs and symptoms of bleeding   Margot Ables, PharmD Clinical  Pharmacist 08/03/2019 8:36 AM

## 2019-08-03 NOTE — Progress Notes (Signed)
PROGRESS NOTE    [name]  LKG:[id number] DOB: 1935-05-17 DOA: 07/25/2019 PCP: [name], FNP   Brief Narrative:  As per H&P written by Dr. [name] on 07/25/2019 84 y.o.female,w hypertension, hyperlipidemia, Dm2, CKD stage 3, Anemia, h/o pacer, Pafib, CAD, mild- mod Aortic regurgitation, mild Mitral regurgitation, CHF (EF 55-60%) , h/o Covid-19 infection, Copd, c/o increase in dyspnea for the past 5 days, Pt has had cough w grey sputum, pt denies fever, chills, cp, palp, n/v, abd pain, diarrhea, brbpr, black stool,   In ED,  T 98.5, P 64, R 16, Bp 119/66 pox 95%on 4L [place] WT 46.3  CXR IMPRESSION: 1. Low lung volumes with patchy mixed interstitial and airspace opacity with a basilar predominance. Findings are suspicious for multifocal pneumonia or aspiration though mixed interstitial and alveolar edema could have a similar appearance 2. Small right pleural effusion. 3. Stable cardiomegaly.  Na 142, K 3.7, Bun 34, creatinine 1.61 ASt 24, Alt 14 Wbc 7.5, Hgb 8.7, Plt 242 Trop 20 -> 18 BNP 790   COVID test negative  12/18:Patient continues to have significant hypoxemia and remains on 8 L nasal cannula. She also appears quite weak. We will plan to wean oxygen today and attempt a voiding trial with discontinuation of Foley catheter. PT evaluation will be necessary prior to discharge. Continue on Rocephin and azithromycin for multifocal pneumonia. Follow a.m. labs.  12/19: Patient is currently on 5 L nasal cannula oxygen. She still appears quite weak. PT recommending SNF on discharge. Continuing azithromycin and Rocephin for pneumonia.  12/20:Patient is currently on 3-4 L nasal cannula oxygen. She continues to remain weak with PT recommending SNF on discharge. Her baseline is typically 2 L nasal cannula oxygen.  12/21:Today patient is having ongoing cough with sputum production and is back to 6 L nasal cannula oxygen. We will plan to repeat  chest x-ray and start Mucomyst today. She overall has net negative fluid balance of 1.58 L and creatinine has improved.  12/22: Patient appears to have worsening edema noted on chest x-ray 12/21. Her oxygen requirements have increased to 8 L this morning. We will plan to diurese further with Lasix 40 mg twice daily. Start MiraLAX for bowel movement as well. Wean oxygen as tolerated. Maintain on Foley catheter given urinary retention issues. Requires SNF/rehab on discharge and patient appears agreeable.  12/23: Patient is diuresing quite well on Lasix with over 3 L diuresed yesterday.  We will continue the same for now and try suppository for bowel movement today.  She is currently on 6 L nasal cannula and will continue the weaning process.  12/24: Patient continues to have great urine output on Lasix which I will continue for now she remains on 6 L nasal cannula oxygen.  We will continue to wean down to baseline as tolerated.  She has had a bowel movement after her suppository.  Assessment & Plan:   Principal Problem:   Pneumonia Active Problems:   Type 2 diabetes mellitus with kidney complication, without long-term current use of insulin (HCC)   Essential hypertension   COPD GOLD O    COPD with acute exacerbation (HCC)   CKD (chronic kidney disease), stage III (HCC)   1-acute on chronic respiratory failure in the setting of multifocal pneumonia, COPD exacerbation and CHF exacerbation -Patient heart failure exacerbation is acute on chronic diastolic heart failure.Repeat chest x-ray on 12/21 demonstrates increasing pulmonary edema.  Continue Lasix 40 mg IV twice daily today. -Continue daily weightsand low-sodium diet -Follow strict  I's and O's. -For her COPD will continue the use of IV steroidswhich will be weaned to 40 mg2times daily, start patient on Brovana, Pulmicort and as needed bronchodilators. -Discontinued antibiotics on 12/21 -Wean off oxygen supplementation as  tolerated (patient at baseline using 2-3 L).Currently on6Lnasal cannula. -Start Mucomyst for chest congestion and continue flutter valve  2-chronic atrial fibrillation -Rate control currently -Continue Coumadin per pharmacy -Continue Cardizem -Monitoring on telemetry.  3-Type 2 diabetes mellitus with kidney complication, without long-term current use of insulin (HCC) -With noted steroid-induced hyperglycemia. -While inpatient will hold oral hypoglycemic agents -Continue sliding scale insulin and Lantuswith doses that have been increased along with mealtime insulins that have been added.  4-Essential hypertension -Stable overall -Continue current antihypertensive regimen.  5-chronic kidney disease is stage III b. -Appears to be stable -Close monitoring of electrolytes and renal function in the setting of acute IV diuresis.  6-gastroesophageal reflux disease -Continue PPI and Carafate.  7-history of chronic pancreatitis -Continue Creon -Patient reports no abdominal pain, nausea or vomiting currently.  8-hyperlipidemia -Continue statins  9-urinary retention -Bladder scan demonstrated over 800 mL retained -Foley catheterremoved on 12/18, but failed voiding trial andtherefore, will keep in place until discharge  DVT prophylaxis:Chronically on Coumadinwith therapeutic INR Code Status:Full code Family Communication:No family at bedside, tried calling daughtermultiple timeswith no response Disposition Plan:Remains inpatient, continue treatment for CHF and COPD exacerbation; negative Covid 19 test; safe to use nebulizer management.Continue Foley catheter until discharge. PT indicating need for SNF on discharge, but patient would like to go home with home health PT.Resume Lasix IV twice daily on 12/21 and wean oxygen to baseline of 2 L as tolerated; still requiring 6 L.  Consultants:  None  Procedures:  See below for x-ray reports  Antimicrobials:   Anti-infectives (From admission, onward)   Start     Dose/Rate Route Frequency Ordered Stop   07/25/19 1830  cefTRIAXone (ROCEPHIN) 2 g in sodium chloride 0.9 % 100 mL IVPB     2 g 200 mL/hr over 30 Minutes Intravenous Every 24 hours 07/25/19 1826 07/29/19 2146   07/25/19 1830  azithromycin (ZITHROMAX) 500 mg in sodium chloride 0.9 % 250 mL IVPB     500 mg 250 mL/hr over 60 Minutes Intravenous Every 24 hours 07/25/19 1826 07/29/19 2316       Subjective: Patient seen and evaluated today with some ongoing abdominal and back pain.  She still remains on 6 L nasal cannula oxygen.  She has had a bowel movement after suppository.  She continues to diurese well.  Objective: Vitals:   08/02/19 2130 08/03/19 0500 08/03/19 0551 08/03/19 0750  BP: 133/73  129/71   Pulse: 99  95   Resp: 20  20   Temp: 98.6 F (37 C)  97.8 F (36.6 C)   TempSrc: Oral  Oral   SpO2: 100%  93% 91%  Weight:  63.8 kg    Height:        Intake/Output Summary (Last 24 hours) at 08/03/2019 1127 Last data filed at 08/03/2019 1033 Gross per 24 hour  Intake 1930 ml  Output 2600 ml  Net -670 ml   Filed Weights   08/01/19 0500 08/02/19 0500 08/03/19 0500  Weight: 68.5 kg 65.2 kg 63.8 kg    Examination:  General exam: Appears calm and comfortable, frail Respiratory system: Clear to auscultation. Respiratory effort normal.  Still on 6 L nasal cannula oxygen. Cardiovascular system: S1 & S2 heard, RRR. No JVD, murmurs, rubs, gallops or clicks. No  pedal edema. Gastrointestinal system: Abdomen is nondistended, soft and nontender. No organomegaly or masses felt. Normal bowel sounds heard. Central nervous system: Alert and oriented. No focal neurological deficits. Extremities: Symmetric 5 x 5 power. Skin: No rashes, lesions or ulcers Psychiatry: Judgement and insight appear normal. Mood & affect appropriate.     Data Reviewed: I have personally reviewed following labs and imaging studies  CBC: Recent Labs    Lab 2019-08-04 0706 07/30/19 0704 07/31/19 0523 08/02/19 0542 08/03/19 0606  WBC 9.2 9.9 10.6* 12.4* 16.2*  HGB 9.7* 10.4* 10.7* 11.2* 11.0*  HCT 32.3* 35.3* 35.8* 36.6 35.9*  MCV 109.1* 108.3* 108.2* 107.3* 104.7*  PLT 274 314 315 286 427   Basic Metabolic Panel: Recent Labs  Lab 07/30/19 0704 07/31/19 0523 08/01/19 0533 08/02/19 0542 08/03/19 0606  NA 141 140 140 139 138  K 4.6 4.8 4.8 5.0 4.4  CL 105 104 107 101 99  CO2 25 25 23 27 27   GLUCOSE 149* 159* 196* 147* 134*  BUN 68* 63* 61* 62* 63*  CREATININE 1.49* 1.24* 1.15* 1.20* 1.24*  CALCIUM 9.2 9.5 9.5 9.7 9.4  MG  --   --   --  2.0 2.0   GFR: Estimated Creatinine Clearance: 30.7 mL/min (A) (by C-G formula based on SCr of 1.24 mg/dL (H)). Liver Function Tests: No results for input(s): AST, ALT, ALKPHOS, BILITOT, PROT, ALBUMIN in the last 168 hours. No results for input(s): LIPASE, AMYLASE in the last 168 hours. No results for input(s): AMMONIA in the last 168 hours. Coagulation Profile: Recent Labs  Lab 07/30/19 0704 07/31/19 0523 08/01/19 0533 08/02/19 0542 08/03/19 0606  INR 2.5* 2.5* 2.4* 2.4* 2.5*   Cardiac Enzymes: No results for input(s): CKTOTAL, CKMB, CKMBINDEX, TROPONINI in the last 168 hours. BNP (last 3 results) No results for input(s): PROBNP in the last 8760 hours. HbA1C: No results for input(s): HGBA1C in the last 72 hours. CBG: Recent Labs  Lab 08/02/19 0744 08/02/19 1101 08/02/19 1610 08/02/19 2132 08/03/19 0741  GLUCAP 148* 175* 177* 99 136*   Lipid Profile: No results for input(s): CHOL, HDL, LDLCALC, TRIG, CHOLHDL, LDLDIRECT in the last 72 hours. Thyroid Function Tests: No results for input(s): TSH, T4TOTAL, FREET4, T3FREE, THYROIDAB in the last 72 hours. Anemia Panel: No results for input(s): VITAMINB12, FOLATE, FERRITIN, TIBC, IRON, RETICCTPCT in the last 72 hours. Sepsis Labs: No results for input(s): PROCALCITON, LATICACIDVEN in the last 168 hours.  Recent Results  (from the past 240 hour(s))  Culture, blood (Routine X 2) w Reflex to ID Panel     Status: None   Collection Time: 07/25/19  5:54 PM   Specimen: Right Antecubital; Blood  Result Value Ref Range Status   Specimen Description RIGHT ANTECUBITAL  Final   Special Requests   Final    BOTTLES DRAWN AEROBIC AND ANAEROBIC Blood Culture adequate volume   Culture   Final    NO GROWTH 5 DAYS Performed at [facility], [address] [place], [place] [address]    Report Status 07/30/2019 FINAL  Final  Culture, blood (Routine X 2) w Reflex to ID Panel     Status: None   Collection Time: 07/25/19  7:09 PM   Specimen: BLOOD LEFT WRIST  Result Value Ref Range Status   Specimen Description BLOOD LEFT WRIST  Final   Special Requests   Final    BOTTLES DRAWN AEROBIC AND ANAEROBIC Blood Culture adequate volume   Culture   Final    NO GROWTH  5 DAYS Performed at [facility], [address], [place], [place] [address]    Report Status 07/30/2019 FINAL  Final  SARS CORONAVIRUS 2 (TAT 6-24 HRS) Nasopharyngeal Nasopharyngeal Swab     Status: None   Collection Time: 07/25/19  7:13 PM   Specimen: Nasopharyngeal Swab  Result Value Ref Range Status   SARS Coronavirus 2 NEGATIVE NEGATIVE Final    Comment: (NOTE) SARS-CoV-2 target nucleic acids are NOT DETECTED. The SARS-CoV-2 RNA is generally detectable in upper and lower respiratory specimens during the acute phase of infection. Negative results do not preclude SARS-CoV-2 infection, do not rule out co-infections with other pathogens, and should not be used as the sole basis for treatment or other patient management decisions. Negative results must be combined with clinical observations, patient history, and epidemiological information. The expected result is Negative. Fact Sheet for Patients: SugarRoll.be Fact Sheet for Healthcare Providers: https://www.woods-mathews.com/ This test is not yet approved or cleared  by the [place] FDA and  has been authorized for detection and/or diagnosis of SARS-CoV-2 by FDA under an Emergency Use Authorization (EUA). This EUA will remain  in effect (meaning this test can be used) for the duration of the COVID-19 declaration under Section 56 4(b)(1) of the Act, 21 U.S.C. section 360bbb-3(b)(1), unless the authorization is terminated or revoked sooner. Performed at [facility], [place] [address] [place], [place] [address]          Radiology Studies: No results found.      Scheduled Meds: . acetylcysteine  4 mL Nebulization BID  . arformoterol  15 mcg Nebulization BID  . budesonide (PULMICORT) nebulizer solution  0.5 mg Nebulization BID  . dextromethorphan-guaiFENesin  1 tablet Oral BID  . diltiazem  120 mg Oral Daily  . feeding supplement (PRO-STAT SUGAR FREE 64)  30 mL Oral BID  . ferrous sulfate  325 mg Oral Q breakfast  . furosemide  40 mg Intravenous Q12H  . insulin aspart  0-20 Units Subcutaneous TID WC  . insulin aspart  0-5 Units Subcutaneous QHS  . insulin aspart  3 Units Subcutaneous TID WC  . insulin glargine  7 Units Subcutaneous QHS  . lidocaine  1 patch Transdermal Q24H  . lipase/protease/amylase  36,000 Units Oral TID WC  . methylPREDNISolone (SOLU-MEDROL) injection  40 mg Intravenous Q12H  . multivitamin-lutein  1 capsule Oral Daily  . pantoprazole  40 mg Oral BID  . polyethylene glycol  17 g Oral Daily  . potassium chloride SA  20 mEq Oral Daily  . simvastatin  10 mg Oral Daily  . sucralfate  1 g Oral TID WC & HS  . warfarin  1 mg Oral q1800  . Warfarin - Pharmacist Dosing Inpatient   Does not apply q1800   Continuous Infusions:   LOS: 9 days    Time spent: 30 minutes    Gwen Sarvis Darleen Crocker, DO [facility] Pager [phone]  If 7PM-7AM, please contact night-coverage [email or web address] Password [facility] 08/03/2019, 11:27 AM

## 2019-08-03 NOTE — Telephone Encounter (Signed)
The pt daughter states she has been in the hospital for a week. She also states they have received the new monitor but she do not have a land line. I told her I will order her a cell adapter for the monitor and send it to her. The pt daughter verbalized understanding and thanked me for the call.

## 2019-08-03 NOTE — Plan of Care (Signed)

## 2019-08-03 NOTE — Progress Notes (Signed)
Patient complained of gas and burning rectum.  Patient felt like she was having diarrhea but was only passing gas.  Barrier ointment and sacral foam placed.  Patient had one episode of chest pain and trouble breathing.  Albuterol inhaler, klonopin and norco given.  Pain and anxiety decreased.

## 2019-08-03 NOTE — TOC Progression Note (Signed)
Transition of Care [facility]) - Progression Note    Patient Details  Name: [name] MRN: [id number] Date of Birth: Mar 18, 1935  Transition of [facility] CM/SW Contact  Shade Flood, LCSW Phone Number: 08/03/2019, 11:15 AM  Clinical Narrative:     Pt status discussed with MD in Progression today. Per MD, pt continues with dieuresis and 6L O2 which is above her baseline O2. MD anticipating pt will return home with foley in place and will likely be here several more days.  Updated [place] at [facility]. They had previously stated that they could not start care until 12/27. Updated that MD not sure pt will even be able to dc before 12/27. TOC will follow.  Expected Discharge Plan: [place] Barriers to Discharge: Continued Medical Work up  Expected Discharge Plan and Services Expected Discharge Plan: [place]: RN, PT [facility] Agency: [place] ([facility]) Date [place]: 08/01/19 Time [place]: 1355 Representative spoke with at [place]: [place] ([place]) Interventions    Readmission Risk Interventions Readmission Risk Prevention Plan 07/28/2019 05/14/2019 04/27/2019  Transportation Screening Complete Complete -  PCP or Specialist Appt within 3-5 Days - Complete Complete  HRI or [place] - Complete Complete  Social Work Consult for [place] Planning/Counseling - Complete -  [place] - Not Applicable -  Medication Review Press photographer) Complete Complete -  PCP or Specialist appointment within 3-5 days of discharge Not Complete - -  [place] or Home Care Consult Complete - -  Palliative Care Screening Not Complete - -  [place] Not Complete - -  Some recent data might be hidden

## 2019-08-04 ENCOUNTER — Inpatient Hospital Stay (HOSPITAL_COMMUNITY): Payer: Medicare HMO

## 2019-08-04 LAB — CBC
HCT: 35.3 % — ABNORMAL LOW (ref 36.0–46.0)
Hemoglobin: 10.8 g/dL — ABNORMAL LOW (ref 12.0–15.0)
MCH: 33 pg (ref 26.0–34.0)
MCHC: 30.6 g/dL (ref 30.0–36.0)
MCV: 108 fL — ABNORMAL HIGH (ref 80.0–100.0)
Platelets: 249 10*3/uL (ref 150–400)
RBC: 3.27 MIL/uL — ABNORMAL LOW (ref 3.87–5.11)
RDW: 14.7 % (ref 11.5–15.5)
WBC: 11.7 10*3/uL — ABNORMAL HIGH (ref 4.0–10.5)
nRBC: 0 % (ref 0.0–0.2)

## 2019-08-04 LAB — PROTIME-INR
INR: 2.8 — ABNORMAL HIGH (ref 0.8–1.2)
Prothrombin Time: 29.3 seconds — ABNORMAL HIGH (ref 11.4–15.2)

## 2019-08-04 LAB — BASIC METABOLIC PANEL
Anion gap: 12 (ref 5–15)
BUN: 60 mg/dL — ABNORMAL HIGH (ref 8–23)
CO2: 29 mmol/L (ref 22–32)
Calcium: 9.3 mg/dL (ref 8.9–10.3)
Chloride: 97 mmol/L — ABNORMAL LOW (ref 98–111)
Creatinine, Ser: 1.16 mg/dL — ABNORMAL HIGH (ref 0.44–1.00)
GFR calc Af Amer: 51 mL/min — ABNORMAL LOW (ref >60)
GFR calc non Af Amer: 44 mL/min — ABNORMAL LOW (ref >60)
Glucose, Bld: 179 mg/dL — ABNORMAL HIGH (ref 70–99)
Potassium: 4.1 mmol/L (ref 3.5–5.1)
Sodium: 138 mmol/L (ref 135–145)

## 2019-08-04 LAB — GLUCOSE, CAPILLARY
Glucose-Capillary: 149 mg/dL — ABNORMAL HIGH (ref 70–99)
Glucose-Capillary: 243 mg/dL — ABNORMAL HIGH (ref 70–99)
Glucose-Capillary: 163 mg/dL — ABNORMAL HIGH (ref 70–99)
Glucose-Capillary: 93 mg/dL (ref 70–99)

## 2019-08-04 LAB — MAGNESIUM: Magnesium: 2.1 mg/dL (ref 1.7–2.4)

## 2019-08-04 MED ORDER — GERHARDT'S BUTT CREAM
TOPICAL_CREAM | Freq: Every day | CUTANEOUS | Status: DC
  Administered 2019-08-05: 1 via TOPICAL
  Filled 2019-08-04: qty 1

## 2019-08-04 MED ORDER — HYDROCODONE-ACETAMINOPHEN 5-325 MG PO TABS
1.0000 | ORAL_TABLET | ORAL | Status: DC | PRN
  Administered 2019-08-04 – 2019-08-06 (×11): 2 via ORAL
  Administered 2019-08-07 – 2019-08-08 (×3): 1 via ORAL
  Filled 2019-08-04 (×5): qty 2
  Filled 2019-08-04: qty 1
  Filled 2019-08-04 (×6): qty 2
  Filled 2019-08-04 (×2): qty 1

## 2019-08-04 MED ORDER — HYDROCORTISONE (PERIANAL) 2.5 % EX CREA
TOPICAL_CREAM | Freq: Two times a day (BID) | CUTANEOUS | Status: DC
  Administered 2019-08-05 – 2019-08-06 (×2): 1 via RECTAL
  Filled 2019-08-04: qty 28.35

## 2019-08-04 MED ORDER — WARFARIN 0.5 MG HALF TABLET
0.5000 mg | ORAL_TABLET | Freq: Once | ORAL | Status: AC
  Administered 2019-08-04: 0.5 mg via ORAL
  Filled 2019-08-04: qty 1

## 2019-08-04 NOTE — Progress Notes (Signed)
ANTICOAGULATION CONSULT NOTE -   Pharmacy Consult for warfarin dosing  Indication: atrial fibrillation   Allergies  Allergen Reactions  . Chlorhexidine   . Macrobid [Nitrofurantoin Monohyd Macro] Rash  . Torsemide Nausea And Vomiting  . Tramadol Nausea Only      Patient Measurements: Last Weight  Most recent update: 08/04/2019  7:19 AM   Weight  63.6 kg (140 lb 3.4 oz)           Body mass index is 25.65 kg/m. [name]               Temp: 98.4 F (36.9 C) (12/25 0619) Temp Source: Oral (12/25 0619) BP: 129/76 (12/25 0619) Pulse Rate: 102 (12/25 0619)  Labs: Recent Labs    08/02/19 0542 08/03/19 0606 08/04/19 0652  HGB 11.2* 11.0* 10.8*  HCT 36.6 35.9* 35.3*  PLT 286 277 249  LABPROT 26.4* 26.9* 29.3*  INR 2.4* 2.5* 2.8*  CREATININE 1.20* 1.24*  --     Estimated Creatinine Clearance: 30.6 mL/min (A) (by C-G formula based on SCr of 1.24 mg/dL (H)).  Goal of Therapy:  INR 2-3 Monitor CBC     Prior to Admission Warfarin Dosing:  [name] takes 1mg  of warfarin daily    Admit INR was 2.0 Lab Results  Component Value Date   INR 2.8 (H) 08/04/2019   INR 2.5 (H) 08/03/2019   INR 2.4 (H) 08/02/2019    Assessment: [name] a 84 y.o. female requires anticoagulation with warfarin for the indication of  Afib. Potential for drug drug interaction with azithromycin, will monitor closely. Warfarin will be initiated inpatient following pharmacy protocol per pharmacy consult. Patient most recent blood work is as follows:   CBC Latest Ref Rng & Units 08/04/2019 08/03/2019 08/02/2019  WBC 4.0 - 10.5 K/uL 11.7(H) 16.2(H) 12.4(H)  Hemoglobin 12.0 - 15.0 g/dL 10.8(L) 11.0(L) 11.2(L)  Hematocrit 36.0 - 46.0 % 35.3(L) 35.9(L) 36.6  Platelets 150 - 400 K/uL 249 277 286   INR drifting upward, likely due to drug interaction. Dose adjusted.  Plan: Warfarin 0.5mg  po today  Monitor CBC qmwf with am labs   Monitor INR daily Monitor for signs and  symptoms of bleeding   Isac Sarna, BS [place], BCPS Clinical Pharmacist Pager [phone] 08/04/2019 9:07 AM

## 2019-08-04 NOTE — Progress Notes (Signed)
PROGRESS NOTE    [name]  UVO:[id number] DOB: 1934-09-01 DOA: 07/25/2019 PCP: [name], FNP   Brief Narrative:  As per H&P written by Dr. [name] on 07/25/2019 84 y.o.female,w hypertension, hyperlipidemia, Dm2, CKD stage 3, Anemia, h/o pacer, Pafib, CAD, mild- mod Aortic regurgitation, mild Mitral regurgitation, CHF (EF 55-60%) , h/o Covid-19 infection, Copd, c/o increase in dyspnea for the past 5 days, Pt has had cough w grey sputum, pt denies fever, chills, cp, palp, n/v, abd pain, diarrhea, brbpr, black stool,   In ED,  T 98.5, P 64, R 16, Bp 119/66 pox 95%on 4L [place] WT 46.3  CXR IMPRESSION: 1. Low lung volumes with patchy mixed interstitial and airspace opacity with a basilar predominance. Findings are suspicious for multifocal pneumonia or aspiration though mixed interstitial and alveolar edema could have a similar appearance 2. Small right pleural effusion. 3. Stable cardiomegaly.  Na 142, K 3.7, Bun 34, creatinine 1.61 ASt 24, Alt 14 Wbc 7.5, Hgb 8.7, Plt 242 Trop 20 -> 18 BNP 790   COVID test negative  12/18:Patient continues to have significant hypoxemia and remains on 8 L nasal cannula. She also appears quite weak. We will plan to wean oxygen today and attempt a voiding trial with discontinuation of Foley catheter. PT evaluation will be necessary prior to discharge. Continue on Rocephin and azithromycin for multifocal pneumonia. Follow a.m. labs.  12/19: Patient is currently on 5 L nasal cannula oxygen. She still appears quite weak. PT recommending SNF on discharge. Continuing azithromycin and Rocephin for pneumonia.  12/20:Patient is currently on 3-4 L nasal cannula oxygen. She continues to remain weak with PT recommending SNF on discharge. Her baseline is typically 2 L nasal cannula oxygen.  12/21:Today patient is having ongoing cough with sputum production and is back to 6 L nasal cannula oxygen. We will plan to repeat  chest x-ray and start Mucomyst today. She overall has net negative fluid balance of 1.58 L and creatinine has improved.  12/22: Patient appears to have worsening edema noted on chest x-ray 12/21. Her oxygen requirements have increased to 8 L this morning. We will plan to diurese further with Lasix 40 mg twice daily. Start MiraLAX for bowel movement as well. Wean oxygen as tolerated. Maintain on Foley catheter given urinary retention issues. Requires SNF/rehab on discharge and patient appears agreeable.  12/23:Patient is diuresing quite well on Lasix with over 3 L diuresed yesterday. We will continue the same for now and try suppository for bowel movement today. She is currently on 6 L nasal cannula and will continue the weaning process.  12/24: Patient continues to have great urine output on Lasix which I will continue for now she remains on 6 L nasal cannula oxygen.  We will continue to wean down to baseline as tolerated.  She has had a bowel movement after her suppository.  12/25: Patient continues to have good urine output and stable creatinine.  We will continue Lasix for diuresis and continue weaning oxygen as tolerated.  She is complaining of rectal pain as well as ongoing low back and hip pain.  We will plan to increase her frequency of oral narcotics.  We will start Anusol rectal gel as well.  Some leukocytosis noted secondary to steroids which I feel can be discontinued at this point.  Repeat chest x-ray today to reevaluate as well as KUB to assess stool burden.  Assessment & Plan:   Principal Problem:   Pneumonia Active Problems:   Type 2 diabetes mellitus  with kidney complication, without long-term current use of insulin (HCC)   Essential hypertension   COPD GOLD O    COPD with acute exacerbation (HCC)   CKD (chronic kidney disease), stage III (HCC)   1-acute on chronic respiratory failure in the setting of multifocal pneumonia, COPD exacerbation and CHF exacerbation  -Patient heart failure exacerbation is acute on chronic diastolic heart failure.Repeat chest x-ray on 12/21 demonstrates increasing pulmonary edema.Continue Lasix 40 mg IV twice daily today. -Continue daily weightsand low-sodium diet -Follow strict I's and O's. -For her COPD will continue the use of IV steroidswhich will be weaned to 40 mg2times daily, start patient on Brovana, Pulmicort and as needed bronchodilators. -Discontinuedantibioticson 12/21 -Wean off oxygen supplementation as tolerated (patient at baseline using 2-3 L).Currently on6Lnasal cannula. -Start Mucomyst for chest congestion and continue flutter valve -Recheck chest x-ray as well as KUB  2-chronic atrial fibrillation -Rate control currently -Continue Coumadin per pharmacy -Continue Cardizem -Monitoring on telemetry.  3-Type 2 diabetes mellitus with kidney complication, without long-term current use of insulin (HCC) -With noted steroid-induced hyperglycemia. -While inpatient will hold oral hypoglycemic agents -Continue sliding scale insulin and Lantuswith doses that have been increased along with mealtime insulins that have been added.  4-Essential hypertension -Stable overall -Continue current antihypertensive regimen.  5-chronic kidney disease is stage III b. -Appears to be stable -Close monitoring of electrolytes and renal function in the setting of acute IV diuresis.  6-gastroesophageal reflux disease -Continue PPI and Carafate.  7-history of chronic pancreatitis -Continue Creon -Patient reports no abdominal pain, nausea or vomiting currently.  8-hyperlipidemia -Continue statins  9-urinary retention -Bladder scan demonstrated over 800 mL retained -Foley catheterremoved on 12/18, but failed voiding trial andtherefore, will keep in place until discharge  10-chronic pain -Primarily in low back and neck along with hip -We will increase oral narcotic medications -Continue  lidocaine patch -Anusol gel for rectal pain  DVT prophylaxis:Chronically on Coumadinwith therapeutic INR-therapeutic Code Status:Full code Family Communication:No family at bedside, tried calling daughtermultiple timeswith no response Disposition Plan:Remains inpatient, continue treatment for CHF and COPD exacerbation; negative Covid 19 test; safe to use nebulizer management.Continue Foley catheter until discharge. PT indicating need for SNF on discharge, but patient would like to go home with home health PT.Continue IV Lasix and recheck chest x-ray as well as KUB.  Wears baseline 2 L nasal cannula and is still on 6 L.  Consultants:  None  Procedures:  See below for x-ray reports  Antimicrobials:  Anti-infectives (From admission, onward)   Start     Dose/Rate Route Frequency Ordered Stop   07/25/19 1830  cefTRIAXone (ROCEPHIN) 2 g in sodium chloride 0.9 % 100 mL IVPB     2 g 200 mL/hr over 30 Minutes Intravenous Every 24 hours 07/25/19 1826 07/29/19 2146   07/25/19 1830  azithromycin (ZITHROMAX) 500 mg in sodium chloride 0.9 % 250 mL IVPB     500 mg 250 mL/hr over 60 Minutes Intravenous Every 24 hours 07/25/19 1826 07/29/19 2316       Subjective: Patient seen and evaluated today with ongoing pain in her low back and hip.  She complains of rectal pain as well.  She still noted to have good diuresis with ongoing Lasix.  Objective: Vitals:   08/03/19 2052 08/04/19 0008 08/04/19 0619 08/04/19 0754  BP: 134/74  129/76   Pulse: 90  (!) 102   Resp: 20  19   Temp: 97.8 F (36.6 C)  98.4 F (36.9 C)   TempSrc: Oral  Oral   SpO2: 96% 95% 97% 93%  Weight:   63.6 kg   Height:        Intake/Output Summary (Last 24 hours) at 08/04/2019 1153 Last data filed at 08/04/2019 1050 Gross per 24 hour  Intake 1825 ml  Output 1525 ml  Net 300 ml   Filed Weights   08/02/19 0500 08/03/19 0500 08/04/19 0619  Weight: 65.2 kg 63.8 kg 63.6 kg    Examination:  General  exam: Appears calm and comfortable, frail Respiratory system: Clear to auscultation. Respiratory effort normal.  Currently on 6 L nasal cannula. Cardiovascular system: S1 & S2 heard, RRR. No JVD, murmurs, rubs, gallops or clicks. No pedal edema. Gastrointestinal system: Abdomen is nondistended, soft and nontender. No organomegaly or masses felt. Normal bowel sounds heard. Central nervous system: Alert and awake Extremities: No significant edema Skin: No rashes, lesions or ulcers Psychiatry: Flat affect    Data Reviewed: I have personally reviewed following labs and imaging studies  CBC: Recent Labs  Lab 07/30/19 0704 07/31/19 0523 08/02/19 0542 08/03/19 0606 08/04/19 0652  WBC 9.9 10.6* 12.4* 16.2* 11.7*  HGB 10.4* 10.7* 11.2* 11.0* 10.8*  HCT 35.3* 35.8* 36.6 35.9* 35.3*  MCV 108.3* 108.2* 107.3* 104.7* 108.0*  PLT 314 315 286 277 811   Basic Metabolic Panel: Recent Labs  Lab 07/31/19 0523 08/01/19 0533 08/02/19 0542 08/03/19 0606 08/04/19 0652  NA 140 140 139 138 138  K 4.8 4.8 5.0 4.4 4.1  CL 104 107 101 99 97*  CO2 25 23 27 27 29   GLUCOSE 159* 196* 147* 134* 179*  BUN 63* 61* 62* 63* 60*  CREATININE 1.24* 1.15* 1.20* 1.24* 1.16*  CALCIUM 9.5 9.5 9.7 9.4 9.3  MG  --   --  2.0 2.0 2.1   GFR: Estimated Creatinine Clearance: 32.8 mL/min (A) (by C-G formula based on SCr of 1.16 mg/dL (H)). Liver Function Tests: No results for input(s): AST, ALT, ALKPHOS, BILITOT, PROT, ALBUMIN in the last 168 hours. No results for input(s): LIPASE, AMYLASE in the last 168 hours. No results for input(s): AMMONIA in the last 168 hours. Coagulation Profile: Recent Labs  Lab 07/31/19 0523 08/01/19 0533 08/02/19 0542 08/03/19 0606 08/04/19 0652  INR 2.5* 2.4* 2.4* 2.5* 2.8*   Cardiac Enzymes: No results for input(s): CKTOTAL, CKMB, CKMBINDEX, TROPONINI in the last 168 hours. BNP (last 3 results) No results for input(s): PROBNP in the last 8760 hours. HbA1C: No results for  input(s): HGBA1C in the last 72 hours. CBG: Recent Labs  Lab 08/03/19 0741 08/03/19 1207 08/03/19 1655 08/03/19 2055 08/04/19 0825  GLUCAP 136* 126* 194* 194* 149*   Lipid Profile: No results for input(s): CHOL, HDL, LDLCALC, TRIG, CHOLHDL, LDLDIRECT in the last 72 hours. Thyroid Function Tests: No results for input(s): TSH, T4TOTAL, FREET4, T3FREE, THYROIDAB in the last 72 hours. Anemia Panel: No results for input(s): VITAMINB12, FOLATE, FERRITIN, TIBC, IRON, RETICCTPCT in the last 72 hours. Sepsis Labs: No results for input(s): PROCALCITON, LATICACIDVEN in the last 168 hours.  Recent Results (from the past 240 hour(s))  Culture, blood (Routine X 2) w Reflex to ID Panel     Status: None   Collection Time: 07/25/19  5:54 PM   Specimen: Right Antecubital; Blood  Result Value Ref Range Status   Specimen Description RIGHT ANTECUBITAL  Final   Special Requests   Final    BOTTLES DRAWN AEROBIC AND ANAEROBIC Blood Culture adequate volume   Culture   Final    NO  GROWTH 5 DAYS Performed at [facility], [address], [place], [place] [address]    Report Status 07/30/2019 FINAL  Final  Culture, blood (Routine X 2) w Reflex to ID Panel     Status: None   Collection Time: 07/25/19  7:09 PM   Specimen: BLOOD LEFT WRIST  Result Value Ref Range Status   Specimen Description BLOOD LEFT WRIST  Final   Special Requests   Final    BOTTLES DRAWN AEROBIC AND ANAEROBIC Blood Culture adequate volume   Culture   Final    NO GROWTH 5 DAYS Performed at [facility], [address], [place], [place] [address]    Report Status 07/30/2019 FINAL  Final  SARS CORONAVIRUS 2 (TAT 6-24 HRS) Nasopharyngeal Nasopharyngeal Swab     Status: None   Collection Time: 07/25/19  7:13 PM   Specimen: Nasopharyngeal Swab  Result Value Ref Range Status   SARS Coronavirus 2 NEGATIVE NEGATIVE Final    Comment: (NOTE) SARS-CoV-2 target nucleic acids are NOT DETECTED. The SARS-CoV-2 RNA is generally detectable in  upper and lower respiratory specimens during the acute phase of infection. Negative results do not preclude SARS-CoV-2 infection, do not rule out co-infections with other pathogens, and should not be used as the sole basis for treatment or other patient management decisions. Negative results must be combined with clinical observations, patient history, and epidemiological information. The expected result is Negative. Fact Sheet for Patients: SugarRoll.be Fact Sheet for Healthcare Providers: https://www.woods-mathews.com/ This test is not yet approved or cleared by the [place] FDA and  has been authorized for detection and/or diagnosis of SARS-CoV-2 by FDA under an Emergency Use Authorization (EUA). This EUA will remain  in effect (meaning this test can be used) for the duration of the COVID-19 declaration under Section 56 4(b)(1) of the Act, 21 U.S.C. section 360bbb-3(b)(1), unless the authorization is terminated or revoked sooner. Performed at [facility], [place] [address] [place], [place] [address]          Radiology Studies: No results found.      Scheduled Meds: . acetylcysteine  4 mL Nebulization BID  . arformoterol  15 mcg Nebulization BID  . budesonide (PULMICORT) nebulizer solution  0.5 mg Nebulization BID  . dextromethorphan-guaiFENesin  1 tablet Oral BID  . diltiazem  120 mg Oral Daily  . feeding supplement (PRO-STAT SUGAR FREE 64)  30 mL Oral BID  . ferrous sulfate  325 mg Oral Q breakfast  . furosemide  40 mg Intravenous Q12H  . Gerhardt's butt cream   Topical Daily  . hydrocortisone   Rectal BID  . insulin aspart  0-20 Units Subcutaneous TID WC  . insulin aspart  0-5 Units Subcutaneous QHS  . insulin aspart  3 Units Subcutaneous TID WC  . insulin glargine  7 Units Subcutaneous QHS  . lidocaine  1 patch Transdermal Q24H  . lipase/protease/amylase  36,000 Units Oral TID WC  . methylPREDNISolone  (SOLU-MEDROL) injection  40 mg Intravenous Q12H  . multivitamin-lutein  1 capsule Oral Daily  . pantoprazole  40 mg Oral BID  . polyethylene glycol  17 g Oral Daily  . potassium chloride SA  20 mEq Oral Daily  . simvastatin  10 mg Oral Daily  . sucralfate  1 g Oral TID WC & HS  . warfarin  0.5 mg Oral Once  . Warfarin - Pharmacist Dosing Inpatient   Does not apply q1800   Continuous Infusions:   LOS: 10 days  Time spent: 30 minutes    Abbas Beyene Darleen Crocker, DO [facility] Pager [phone]  If 7PM-7AM, please contact night-coverage [email or web address] Password [facility] 08/04/2019, 11:53 AM

## 2019-08-05 ENCOUNTER — Inpatient Hospital Stay (HOSPITAL_COMMUNITY): Payer: Medicare HMO

## 2019-08-05 LAB — CBC
HCT: 37.2 % (ref 36.0–46.0)
Hemoglobin: 11.5 g/dL — ABNORMAL LOW (ref 12.0–15.0)
MCH: 33 pg (ref 26.0–34.0)
MCHC: 30.9 g/dL (ref 30.0–36.0)
MCV: 106.9 fL — ABNORMAL HIGH (ref 80.0–100.0)
Platelets: 239 10*3/uL (ref 150–400)
RBC: 3.48 MIL/uL — ABNORMAL LOW (ref 3.87–5.11)
RDW: 14.6 % (ref 11.5–15.5)
WBC: 10.4 10*3/uL (ref 4.0–10.5)
nRBC: 0 % (ref 0.0–0.2)

## 2019-08-05 LAB — BASIC METABOLIC PANEL
Anion gap: 9 (ref 5–15)
BUN: 69 mg/dL — ABNORMAL HIGH (ref 8–23)
CO2: 32 mmol/L (ref 22–32)
Calcium: 8.7 mg/dL — ABNORMAL LOW (ref 8.9–10.3)
Chloride: 94 mmol/L — ABNORMAL LOW (ref 98–111)
Creatinine, Ser: 1.31 mg/dL — ABNORMAL HIGH (ref 0.44–1.00)
GFR calc Af Amer: 44 mL/min — ABNORMAL LOW (ref >60)
GFR calc non Af Amer: 38 mL/min — ABNORMAL LOW (ref >60)
Glucose, Bld: 150 mg/dL — ABNORMAL HIGH (ref 70–99)
Potassium: 3.7 mmol/L (ref 3.5–5.1)
Sodium: 135 mmol/L (ref 135–145)

## 2019-08-05 LAB — GLUCOSE, CAPILLARY
Glucose-Capillary: 142 mg/dL — ABNORMAL HIGH (ref 70–99)
Glucose-Capillary: 164 mg/dL — ABNORMAL HIGH (ref 70–99)
Glucose-Capillary: 159 mg/dL — ABNORMAL HIGH (ref 70–99)
Glucose-Capillary: 130 mg/dL — ABNORMAL HIGH (ref 70–99)

## 2019-08-05 LAB — MAGNESIUM: Magnesium: 2 mg/dL (ref 1.7–2.4)

## 2019-08-05 LAB — TROPONIN I (HIGH SENSITIVITY)
Troponin I (High Sensitivity): 47 ng/L — ABNORMAL HIGH (ref <18)
Troponin I (High Sensitivity): 44 ng/L — ABNORMAL HIGH (ref <18)

## 2019-08-05 LAB — PROTIME-INR
INR: 2.7 — ABNORMAL HIGH (ref 0.8–1.2)
Prothrombin Time: 28.5 seconds — ABNORMAL HIGH (ref 11.4–15.2)

## 2019-08-05 MED ORDER — WARFARIN 0.5 MG HALF TABLET
0.5000 mg | ORAL_TABLET | Freq: Once | ORAL | Status: AC
  Administered 2019-08-05: 0.5 mg via ORAL
  Filled 2019-08-05: qty 1

## 2019-08-05 MED ORDER — FUROSEMIDE 40 MG PO TABS
40.0000 mg | ORAL_TABLET | Freq: Every day | ORAL | Status: DC
  Administered 2019-08-06 – 2019-08-08 (×3): 40 mg via ORAL
  Filled 2019-08-05 (×3): qty 1

## 2019-08-05 MED ORDER — ALUM & MAG HYDROXIDE-SIMETH 200-200-20 MG/5ML PO SUSP
30.0000 mL | Freq: Once | ORAL | Status: AC
  Administered 2019-08-05: 30 mL via ORAL
  Filled 2019-08-05: qty 30

## 2019-08-05 MED ORDER — LIDOCAINE VISCOUS HCL 2 % MT SOLN
15.0000 mL | Freq: Once | OROMUCOSAL | Status: AC
  Administered 2019-08-05: 15 mL via ORAL
  Filled 2019-08-05: qty 15

## 2019-08-05 NOTE — Progress Notes (Signed)
PROGRESS NOTE    [name]  NIO:[id number] DOB: [birth year] DOA: 07/25/2019 PCP: [name], FNP   Brief Narrative:  As per H&P written by Dr. [name] on 07/25/2019 84 y.o.female,w hypertension, hyperlipidemia, Dm2, CKD stage 3, Anemia, h/o pacer, Pafib, CAD, mild- mod Aortic regurgitation, mild Mitral regurgitation, CHF (EF 55-60%) , h/o Covid-19 infection, Copd, c/o increase in dyspnea for the past 5 days, Pt has had cough w grey sputum, pt denies fever, chills, cp, palp, n/v, abd pain, diarrhea, brbpr, black stool,   In ED,  T 98.5, P 64, R 16, Bp 119/66 pox 95%on 4L [place] WT 46.3  CXR IMPRESSION: 1. Low lung volumes with patchy mixed interstitial and airspace opacity with a basilar predominance. Findings are suspicious for multifocal pneumonia or aspiration though mixed interstitial and alveolar edema could have a similar appearance 2. Small right pleural effusion. 3. Stable cardiomegaly.  Na 142, K 3.7, Bun 34, creatinine 1.61 ASt 24, Alt 14 Wbc 7.5, Hgb 8.7, Plt 242 Trop 20 -> 18 BNP 790   COVID test negative  12/18:Patient continues to have significant hypoxemia and remains on 8 L nasal cannula. She also appears quite weak. We will plan to wean oxygen today and attempt a voiding trial with discontinuation of Foley catheter. PT evaluation will be necessary prior to discharge. Continue on Rocephin and azithromycin for multifocal pneumonia. Follow a.m. labs.  12/19: Patient is currently on 5 L nasal cannula oxygen. She still appears quite weak. PT recommending SNF on discharge. Continuing azithromycin and Rocephin for pneumonia.  12/20:Patient is currently on 3-4 L nasal cannula oxygen. She continues to remain weak with PT recommending SNF on discharge. Her baseline is typically 2 L nasal cannula oxygen.  12/21:Today patient is having ongoing cough with sputum production and is back to 6 L nasal cannula oxygen. We will plan to repeat  chest x-ray and start Mucomyst today. She overall has net negative fluid balance of 1.58 L and creatinine has improved.  12/22: Patient appears to have worsening edema noted on chest x-ray 12/21. Her oxygen requirements have increased to 8 L this morning. We will plan to diurese further with Lasix 40 mg twice daily. Start MiraLAX for bowel movement as well. Wean oxygen as tolerated. Maintain on Foley catheter given urinary retention issues. Requires SNF/rehab on discharge and patient appears agreeable.  12/23:Patient is diuresing quite well on Lasix with over 3 L diuresed yesterday. We will continue the same for now and try suppository for bowel movement today. She is currently on 6 L nasal cannula and will continue the weaning process.  12/24:Patient continues to have great urine output on Lasix which I will continue for now she remains on 6 L nasal cannula oxygen. We will continue to wean down to baseline as tolerated. She has had a bowel movement after her suppository.  12/25: Patient continues to have good urine output and stable creatinine.  We will continue Lasix for diuresis and continue weaning oxygen as tolerated.  She is complaining of rectal pain as well as ongoing low back and hip pain.  We will plan to increase her frequency of oral narcotics.  We will start Anusol rectal gel as well.  Some leukocytosis noted secondary to steroids which I feel can be discontinued at this point.  Repeat chest x-ray today to reevaluate as well as KUB to assess stool burden.  12/26: Patient has had her oxygen weaned from 6 L to 4 this morning and repeat chest x-ray demonstrates improvement in  pneumothorax which will be followed in a.m.  She was noted to have some chest pain/indigestion that responded to GI cocktail.  Troponins were evaluated as well as EKG with no new changes or concerns for ACS.  She is noted to have some elevation in creatinine levels for which I will hold IV Lasix and resume  home oral Lasix by a.m.  Monitor repeat labs and wean oxygen to baseline prior to consideration for discharge.  Assessment & Plan:   Principal Problem:   Pneumonia Active Problems:   Type 2 diabetes mellitus with kidney complication, without long-term current use of insulin (HCC)   Essential hypertension   COPD GOLD O    COPD with acute exacerbation (HCC)   CKD (chronic kidney disease), stage III (HCC)   1-acute on chronic respiratory failure in the setting of multifocal pneumonia, COPD exacerbation and CHF exacerbation along with left sided, minimal, nontraumatic pneumothorax -Patient heart failure exacerbation is acute on chronic diastolic heart failure. Which appears to have resolved.  She appears to have diuresed to renal tolerance and I will discontinue IV Lasix today and resume oral Lasix on 12/27. -Follow repeat chest x-ray in a.m. to ensure resolution of pneumothorax which will need follow-up outpatient. -Continue daily weightsand low-sodium diet -Follow strict I's and O's. -For her COPD will continue the use of IV steroidswhich will be weaned to 40 mg2times daily, start patient on Brovana, Pulmicort and as needed bronchodilators. -Discontinuedantibioticson 12/21 -Wean off oxygen supplementation as tolerated (patient at baseline using 2-3 L).Currently on4Lnasal cannula. -Start Mucomyst for chest congestion and continue flutter valve  2-chronic atrial fibrillation -Rate controlled currently -Continue Coumadin per pharmacy -Continue Cardizem -Monitoring on telemetry.  3-Type 2 diabetes mellitus with kidney complication, without long-term current use of insulin (HCC) -Stable and controlled despite IV methylprednisolone use -While inpatient will hold oral hypoglycemic agents -Continue sliding scale insulin and Lantuswith doses that have been increased along with mealtime insulins that have been added.  4-Essential hypertension -Stable overall -Continue current  antihypertensive regimen.  5-chronic kidney disease is stage III b. -Appears to be stable -Patient appears to have diuresed to renal tolerance today and will discontinue IV Lasix and plan to resume oral Lasix on 12/27  6-gastroesophageal reflux disease -Continue PPI and Carafate.  7-history of chronic pancreatitis -Continue Creon -Patient reports no abdominal pain, nausea or vomiting currently.  8-hyperlipidemia -Continue statins  9-urinary retention -Bladder scan demonstrated over 800 mL retained -Foley catheterremoved on 12/18, but failed voiding trial andtherefore, will keep in place until discharge -May consider repeat void trial by tomorrow since IV Lasix has now been discontinued  10-chronic pain -Primarily in low back and neck along with hip -We will increase oral narcotic medications -Continue lidocaine patch -Anusol gel for rectal pain  DVT prophylaxis:Chronically on Coumadinwith therapeutic INR-therapeutic Code Status:Full code Family Communication:No family at bedside, tried calling daughtermultiple timeswith no response Disposition Plan:Remains inpatient, continue treatment for CHF and COPD exacerbation; negative Covid 19 test; safe to use nebulizer management.Continue Foley catheter for now and consider voiding trial by tomorrow prior to discharge.  PT recommending SNF on discharge, but patient would like to go home with home health PT.  Discontinue IV Lasix today and switch to oral by 12/27.  Wean oxygen to baseline 2-3 L, but is still on 4 L due to combination of CHF, pneumothorax, and some COPD exacerbation with slow clinical progression.  Consultants:  None  Procedures:  See below for x-ray reports  Antimicrobials:  Anti-infectives (From admission, onward)  Start     Dose/Rate Route Frequency Ordered Stop   07/25/19 1830  cefTRIAXone (ROCEPHIN) 2 g in sodium chloride 0.9 % 100 mL IVPB     2 g 200 mL/hr over 30 Minutes Intravenous  Every 24 hours 07/25/19 1826 07/29/19 2146   07/25/19 1830  azithromycin (ZITHROMAX) 500 mg in sodium chloride 0.9 % 250 mL IVPB     500 mg 250 mL/hr over 60 Minutes Intravenous Every 24 hours 07/25/19 1826 07/29/19 2316       Subjective: Patient seen and evaluated today with no new acute complaints or concerns. No acute concerns or events noted overnight.  She states that her low back pain and hip pain has improved.  She did complain of some indigestion this morning and was given GI cocktail with improvement in symptoms.  She has been weaned to 4 L nasal cannula today from 6 L yesterday.  Objective: Vitals:   08/04/19 2157 08/04/19 2159 08/05/19 0733 08/05/19 1021  BP:  (!) 109/51  118/64  Pulse: 87 82    Resp:  20    Temp:  98.2 F (36.8 C)    TempSrc:  Oral    SpO2: 94% 92% 95%   Weight:  59.1 kg    Height:        Intake/Output Summary (Last 24 hours) at 08/05/2019 1145 Last data filed at 08/05/2019 1054 Gross per 24 hour  Intake 3327 ml  Output 950 ml  Net 2377 ml   Filed Weights   08/03/19 0500 08/04/19 0619 08/04/19 2159  Weight: 63.8 kg 63.6 kg 59.1 kg    Examination:  General exam: Appears calm and comfortable, frail appearing. Respiratory system: Clear to auscultation. Respiratory effort normal.  Currently on 4 L nasal cannula oxygen. Cardiovascular system: S1 & S2 heard, RRR. No JVD, murmurs, rubs, gallops or clicks. No pedal edema. Gastrointestinal system: Abdomen is nondistended, soft and nontender. No organomegaly or masses felt. Normal bowel sounds heard. Central nervous system: Alert and oriented. No focal neurological deficits. Extremities: Symmetric 5 x 5 power. Skin: No rashes, lesions or ulcers Psychiatry: Judgement and insight appear normal. Mood & affect appropriate.     Data Reviewed: I have personally reviewed following labs and imaging studies  CBC: Recent Labs  Lab 07/31/19 0523 08/02/19 0542 08/03/19 0606 08/04/19 0652 08/05/19 0800   WBC 10.6* 12.4* 16.2* 11.7* 10.4  HGB 10.7* 11.2* 11.0* 10.8* 11.5*  HCT 35.8* 36.6 35.9* 35.3* 37.2  MCV 108.2* 107.3* 104.7* 108.0* 106.9*  PLT 315 286 277 249 160   Basic Metabolic Panel: Recent Labs  Lab 08/01/19 0533 08/02/19 0542 08/03/19 0606 08/04/19 0652 08/05/19 0800  NA 140 139 138 138 135  K 4.8 5.0 4.4 4.1 3.7  CL 107 101 99 97* 94*  CO2 23 27 27 29  32  GLUCOSE 196* 147* 134* 179* 150*  BUN 61* 62* 63* 60* 69*  CREATININE 1.15* 1.20* 1.24* 1.16* 1.31*  CALCIUM 9.5 9.7 9.4 9.3 8.7*  MG  --  2.0 2.0 2.1 2.0   GFR: Estimated Creatinine Clearance: 26.2 mL/min (A) (by C-G formula based on SCr of 1.31 mg/dL (H)). Liver Function Tests: No results for input(s): AST, ALT, ALKPHOS, BILITOT, PROT, ALBUMIN in the last 168 hours. No results for input(s): LIPASE, AMYLASE in the last 168 hours. No results for input(s): AMMONIA in the last 168 hours. Coagulation Profile: Recent Labs  Lab 08/01/19 0533 08/02/19 0542 08/03/19 0606 08/04/19 0652 08/05/19 0800  INR 2.4* 2.4* 2.5* 2.8*  2.7*   Cardiac Enzymes: No results for input(s): CKTOTAL, CKMB, CKMBINDEX, TROPONINI in the last 168 hours. BNP (last 3 results) No results for input(s): PROBNP in the last 8760 hours. HbA1C: No results for input(s): HGBA1C in the last 72 hours. CBG: Recent Labs  Lab 08/04/19 1220 08/04/19 1806 08/04/19 2200 08/05/19 0754 08/05/19 1116  GLUCAP 243* 163* 93 142* 164*   Lipid Profile: No results for input(s): CHOL, HDL, LDLCALC, TRIG, CHOLHDL, LDLDIRECT in the last 72 hours. Thyroid Function Tests: No results for input(s): TSH, T4TOTAL, FREET4, T3FREE, THYROIDAB in the last 72 hours. Anemia Panel: No results for input(s): VITAMINB12, FOLATE, FERRITIN, TIBC, IRON, RETICCTPCT in the last 72 hours. Sepsis Labs: No results for input(s): PROCALCITON, LATICACIDVEN in the last 168 hours.  No results found for this or any previous visit (from the past 240 hour(s)).       Radiology  Studies: DG Abd 1 View  Result Date: 08/04/2019 CLINICAL DATA:  Abdominal pain. EXAM: ABDOMEN - 1 VIEW COMPARISON:  05/12/2019 FINDINGS: The bowel gas pattern is normal. Moderate stool in the rectum. No visible free air on the supine radiograph. No acute bone abnormality. Aortic atherosclerosis. Multiple surgical clips and mesh attachment devices present over the abdomen, unchanged. Lucency at the left lung base laterally probably represents the pneumothorax seen on the chest x-ray of this same date. IMPRESSION: 1. Benign-appearing abdomen. 2.  Aortic Atherosclerosis (ICD10-I70.0). 3. Lucency at the left lung base probably represents the pneumothorax seen on the prior chest x-ray of this same date. Electronically Signed   By: Lorriane Shire M.D.   On: 08/04/2019 12:46   DG CHEST PORT 1 VIEW  Result Date: 08/05/2019 CLINICAL DATA:  Shortness of breath.  Left pneumothorax. EXAM: PORTABLE CHEST 1 VIEW COMPARISON:  08/04/2019 FINDINGS: Left-sided pacemaker unchanged. Patient is rotated to the left. Known small left-sided pneumothorax with slight interval improvement. Moderate stable opacification throughout the left lung. Likely associated component of left pleural fluid. Right lung is clear. Cardiomediastinal silhouette and remainder of the exam is unchanged. IMPRESSION: Slight interval improvement of a small left pneumothorax. Stable moderate opacification throughout the left lung likely with component of basilar pleural fluid/atelectasis. Electronically Signed   By: Marin Olp M.D.   On: 08/05/2019 05:13   DG CHEST PORT 1 VIEW  Result Date: 08/04/2019 CLINICAL DATA:  Abdominal pain. EXAM: PORTABLE CHEST 1 VIEW COMPARISON:  Chest x-ray dated 07/31/2019 FINDINGS: The patient has a new approximately 15% left apical pneumothorax with prominent atelectasis in the left upper lobe. There is slightly improved aeration at the left lung base since the prior study although peripheral density persists which is  probably loculated pleural fluid. The right pleural effusion seen on the prior chest x-ray has resolved. No consolidative infiltrates on the right. There is slight hazy accentuation of the interstitial markings on the right. Pulmonary vascularity is normal. Aortic atherosclerosis. Pacemaker in place. No acute bone abnormality. IMPRESSION: 1. New approximately 15% left apical pneumothorax. 2. Slightly improved aeration at the left lung base since the prior study. 3. Hazy accentuation of the interstitial markings in the right lung. 4.  Aortic Atherosclerosis (ICD10-I70.0). Critical Value/emergent results were called by telephone at the time of interpretation on 08/04/2019 at 12:41 pm to Jackelyn Poling, [place] , who verbally acknowledged these results. Electronically Signed   By: Lorriane Shire M.D.   On: 08/04/2019 12:43        Scheduled Meds: . arformoterol  15 mcg Nebulization BID  . budesonide (PULMICORT) nebulizer  solution  0.5 mg Nebulization BID  . dextromethorphan-guaiFENesin  1 tablet Oral BID  . diltiazem  120 mg Oral Daily  . feeding supplement (PRO-STAT SUGAR FREE 64)  30 mL Oral BID  . ferrous sulfate  325 mg Oral Q breakfast  . [START ON 08/06/2019] furosemide  40 mg Oral Daily  . Gerhardt's butt cream   Topical Daily  . hydrocortisone   Rectal BID  . insulin aspart  0-20 Units Subcutaneous TID WC  . insulin aspart  0-5 Units Subcutaneous QHS  . insulin aspart  3 Units Subcutaneous TID WC  . insulin glargine  7 Units Subcutaneous QHS  . lidocaine  1 patch Transdermal Q24H  . lipase/protease/amylase  36,000 Units Oral TID WC  . methylPREDNISolone (SOLU-MEDROL) injection  40 mg Intravenous Q12H  . multivitamin-lutein  1 capsule Oral Daily  . pantoprazole  40 mg Oral BID  . polyethylene glycol  17 g Oral Daily  . potassium chloride SA  20 mEq Oral Daily  . simvastatin  10 mg Oral Daily  . sucralfate  1 g Oral TID WC & HS  . warfarin  0.5 mg Oral ONCE-1800  . Warfarin - Pharmacist Dosing  Inpatient   Does not apply q1800   Continuous Infusions:   LOS: 11 days    Time spent: 30 minutes    Sanvika Cuttino Darleen Crocker, DO [facility] Pager [phone]  If 7PM-7AM, please contact night-coverage [email or web address] Password [email or web address] 08/05/2019, 11:45 AM

## 2019-08-05 NOTE — Progress Notes (Signed)
**Note De-Identified Koleman Marling Obfuscation** EKG completed and placed in patient chart; RN notified

## 2019-08-05 NOTE — Progress Notes (Addendum)
ANTICOAGULATION CONSULT NOTE -   Pharmacy Consult for warfarin dosing  Indication: atrial fibrillation   Allergies  Allergen Reactions  . Chlorhexidine   . Macrobid [Nitrofurantoin Monohyd Macro] Rash  . Torsemide Nausea And Vomiting  . Tramadol Nausea Only      Patient Measurements: Last Weight  Most recent update: 08/05/2019  6:13 AM   Weight  59.1 kg (130 lb 4.7 oz)           Body mass index is 23.83 kg/m. [name]               Temp: 98.2 F (36.8 C) (12/25 2159) Temp Source: Oral (12/25 2159) BP: 109/51 (12/25 2159) Pulse Rate: 82 (12/25 2159)  Labs: Recent Labs    08/03/19 0606 08/04/19 0652 08/05/19 0800  HGB 11.0* 10.8* 11.5*  HCT 35.9* 35.3* 37.2  PLT 277 249 239  LABPROT 26.9* 29.3* 28.5*  INR 2.5* 2.8* 2.7*  CREATININE 1.24* 1.16* 1.31*    Estimated Creatinine Clearance: 26.2 mL/min (A) (by C-G formula based on SCr of 1.31 mg/dL (H)).  Goal of Therapy:  INR 2-3 Monitor CBC     Prior to Admission Warfarin Dosing:  [name] takes 1mg  of warfarin daily    Admit INR was 2.0 Lab Results  Component Value Date   INR 2.7 (H) 08/05/2019   INR 2.8 (H) 08/04/2019   INR 2.5 (H) 08/03/2019    Assessment: [name] a 84 y.o. female requires anticoagulation with warfarin for the indication of  Afib. Potential for drug drug interaction with azithromycin, will monitor closely. Warfarin will be initiated inpatient following pharmacy protocol per pharmacy consult. Patient most recent blood work is as follows:   CBC Latest Ref Rng & Units 08/05/2019 08/04/2019 08/03/2019  WBC 4.0 - 10.5 K/uL 10.4 11.7(H) 16.2(H)  Hemoglobin 12.0 - 15.0 g/dL 11.5(L) 10.8(L) 11.0(L)  Hematocrit 36.0 - 46.0 % 37.2 35.3(L) 35.9(L)  Platelets 150 - 400 K/uL 239 249 277   INR stable INR 2.7  Plan: Warfarin 0.5mg  po today  Monitor CBC qmwf with am labs   Monitor INR daily Monitor for signs and symptoms of bleeding   Thomasenia Sales, PharmD,  MBA, BCGP Clinical Pharmacist  08/05/2019 9:27 AM

## 2019-08-06 DIAGNOSIS — J930 Spontaneous tension pneumothorax: Secondary | ICD-10-CM

## 2019-08-06 DIAGNOSIS — R338 Other retention of urine: Secondary | ICD-10-CM

## 2019-08-06 DIAGNOSIS — L8990 Pressure ulcer of unspecified site, unspecified stage: Secondary | ICD-10-CM | POA: Insufficient documentation

## 2019-08-06 LAB — PROTIME-INR
INR: 2.7 — ABNORMAL HIGH (ref 0.8–1.2)
Prothrombin Time: 28.4 seconds — ABNORMAL HIGH (ref 11.4–15.2)

## 2019-08-06 LAB — BASIC METABOLIC PANEL
Anion gap: 10 (ref 5–15)
BUN: 60 mg/dL — ABNORMAL HIGH (ref 8–23)
CO2: 34 mmol/L — ABNORMAL HIGH (ref 22–32)
Calcium: 9.3 mg/dL (ref 8.9–10.3)
Chloride: 96 mmol/L — ABNORMAL LOW (ref 98–111)
Creatinine, Ser: 1 mg/dL (ref 0.44–1.00)
GFR calc Af Amer: >60 mL/min (ref >60)
GFR calc non Af Amer: 52 mL/min — ABNORMAL LOW (ref >60)
Glucose, Bld: 134 mg/dL — ABNORMAL HIGH (ref 70–99)
Potassium: 3.6 mmol/L (ref 3.5–5.1)
Sodium: 140 mmol/L (ref 135–145)

## 2019-08-06 LAB — GLUCOSE, CAPILLARY
Glucose-Capillary: 146 mg/dL — ABNORMAL HIGH (ref 70–99)
Glucose-Capillary: 117 mg/dL — ABNORMAL HIGH (ref 70–99)

## 2019-08-06 MED ORDER — WARFARIN 0.5 MG HALF TABLET
0.5000 mg | ORAL_TABLET | Freq: Once | ORAL | Status: AC
  Administered 2019-08-06: 0.5 mg via ORAL
  Filled 2019-08-06: qty 1

## 2019-08-06 NOTE — Progress Notes (Signed)
ANTICOAGULATION CONSULT NOTE -   Pharmacy Consult for warfarin dosing  Indication: atrial fibrillation   Allergies  Allergen Reactions  . Chlorhexidine   . Macrobid [Nitrofurantoin Monohyd Macro] Rash  . Torsemide Nausea And Vomiting  . Tramadol Nausea Only      Patient Measurements: Last Weight  Most recent update: 08/06/2019  5:24 AM   Weight  55.7 kg (122 lb 12.7 oz)           Body mass index is 22.46 kg/m. [name]                  Labs: Recent Labs    08/04/19 7948 08/05/19 0800 08/06/19 0724  HGB 10.8* 11.5*  --   HCT 35.3* 37.2  --   PLT 249 239  --   LABPROT 29.3* 28.5* 28.4*  INR 2.8* 2.7* 2.7*  CREATININE 1.16* 1.31* 1.00    Estimated Creatinine Clearance: 34.3 mL/min (by C-G formula based on SCr of 1 mg/dL).  Goal of Therapy:  INR 2-3 Monitor CBC     Prior to Admission Warfarin Dosing:  [name] takes 1mg  of warfarin daily    Admit INR was 2.0 Lab Results  Component Value Date   INR 2.7 (H) 08/06/2019   INR 2.7 (H) 08/05/2019   INR 2.8 (H) 08/04/2019    Assessment: [name] a 84 y.o. female requires anticoagulation with warfarin for the indication of  Afib. Potential for drug drug interaction with azithromycin, will monitor closely. Warfarin will be initiated inpatient following pharmacy protocol per pharmacy consult. Patient most recent blood work is as follows:   CBC Latest Ref Rng & Units 08/05/2019 08/04/2019 08/03/2019  WBC 4.0 - 10.5 K/uL 10.4 11.7(H) 16.2(H)  Hemoglobin 12.0 - 15.0 g/dL 11.5(L) 10.8(L) 11.0(L)  Hematocrit 36.0 - 46.0 % 37.2 35.3(L) 35.9(L)  Platelets 150 - 400 K/uL 239 249 277   INR stable INR 2.7  Plan: Warfarin 0.5mg  po today  Monitor CBC qmwf with am labs   Monitor INR daily Monitor for signs and symptoms of bleeding   [name], PharmD, MBA, BCGP Clinical Pharmacist  08/06/2019 9:00 AM

## 2019-08-06 NOTE — Progress Notes (Signed)
Removed foley

## 2019-08-06 NOTE — Progress Notes (Signed)
PROGRESS NOTE    [name]  VOZ:[id number] DOB: Dec 21, 1934 DOA: 07/25/2019 PCP: [name], FNP   Brief Narrative:  As per H&P written by Dr. [name] on 07/25/2019 84 y.o.female,w hypertension, hyperlipidemia, Dm2, CKD stage 3, Anemia, h/o pacer, Pafib, CAD, mild- mod Aortic regurgitation, mild Mitral regurgitation, CHF (EF 55-60%) , h/o Covid-19 infection, Copd, c/o increase in dyspnea for the past 5 days, Pt has had cough w grey sputum, pt denies fever, chills, cp, palp, n/v, abd pain, diarrhea, brbpr, black stool,   In ED,  T 98.5, P 64, R 16, Bp 119/66 pox 95%on 4L [place] WT 46.3  CXR IMPRESSION: 1. Low lung volumes with patchy mixed interstitial and airspace opacity with a basilar predominance. Findings are suspicious for multifocal pneumonia or aspiration though mixed interstitial and alveolar edema could have a similar appearance 2. Small right pleural effusion. 3. Stable cardiomegaly.  Na 142, K 3.7, Bun 34, creatinine 1.61 ASt 24, Alt 14 Wbc 7.5, Hgb 8.7, Plt 242 Trop 20 -> 18 BNP 790   COVID test negative  12/18:Patient continues to have significant hypoxemia and remains on 8 L nasal cannula. She also appears quite weak. We will plan to wean oxygen today and attempt a voiding trial with discontinuation of Foley catheter. PT evaluation will be necessary prior to discharge. Continue on Rocephin and azithromycin for multifocal pneumonia. Follow a.m. labs.  12/19: Patient is currently on 5 L nasal cannula oxygen. She still appears quite weak. PT recommending SNF on discharge. Continuing azithromycin and Rocephin for pneumonia.  12/20:Patient is currently on 3-4 L nasal cannula oxygen. She continues to remain weak with PT recommending SNF on discharge. Her baseline is typically 2 L nasal cannula oxygen.  12/21:Today patient is having ongoing cough with sputum production and is back to 6 L nasal cannula oxygen. We will plan to repeat  chest x-ray and start Mucomyst today. She overall has net negative fluid balance of 1.58 L and creatinine has improved.  12/22: Patient appears to have worsening edema noted on chest x-ray 12/21. Her oxygen requirements have increased to 8 L this morning. We will plan to diurese further with Lasix 40 mg twice daily. Start MiraLAX for bowel movement as well. Wean oxygen as tolerated. Maintain on Foley catheter given urinary retention issues. Requires SNF/rehab on discharge and patient appears agreeable.  12/23:Patient is diuresing quite well on Lasix with over 3 L diuresed yesterday. We will continue the same for now and try suppository for bowel movement today. She is currently on 6 L nasal cannula and will continue the weaning process.  12/24:Patient continues to have great urine output on Lasix which I will continue for now she remains on 6 L nasal cannula oxygen. We will continue to wean down to baseline as tolerated. She has had a bowel movement after her suppository.  12/25: Patient continues to have good urine output and stable creatinine.  We will continue Lasix for diuresis and continue weaning oxygen as tolerated.  She is complaining of rectal pain as well as ongoing low back and hip pain.  We will plan to increase her frequency of oral narcotics.  We will start Anusol rectal gel as well.  Some leukocytosis noted secondary to steroids which I feel can be discontinued at this point.  Repeat chest x-ray today to reevaluate as well as KUB to assess stool burden.  12/26: Patient has had her oxygen weaned from 6 L to 4 this morning and repeat chest x-ray demonstrates improvement in  pneumothorax which will be followed in a.m.  She was noted to have some chest pain/indigestion that responded to GI cocktail.  Troponins were evaluated as well as EKG with no new changes or concerns for ACS.  She is noted to have some elevation in creatinine levels for which I will hold IV Lasix and resume  home oral Lasix by a.m.  Monitor repeat labs and wean oxygen to baseline prior to consideration for discharge.  Assessment & Plan:   Principal Problem:   Pneumonia Active Problems:   Type 2 diabetes mellitus with kidney complication, without long-term current use of insulin (HCC)   Essential hypertension   COPD GOLD O    COPD with acute exacerbation (HCC)   CKD (chronic kidney disease), stage III (HCC)   Pressure injury of skin   1-acute on chronic respiratory failure in the setting of multifocal pneumonia, COPD exacerbation and CHF exacerbation along with left sided, minimal, nontraumatic pneumothorax -Patient heart failure exacerbation is acute on chronic diastolic heart failure. Which appears to have resolved.  She appears to have diuresed to renal tolerance and I will discontinue IV Lasix today and resume oral Lasix on 12/27. -Follow repeat chest x-ray in a.m. to ensure resolution of pneumothorax which will need follow-up outpatient. -Continue daily weightsand low-sodium diet -Follow strict I's and O's. -For her COPD will continue the use of IV steroidswhich will be weaned to 40 mg2times daily, started patient on Brovana, Pulmicort and as needed bronchodilators. -Discontinuedantibioticson 12/21after completing therapy. -Wean off oxygen supplementation as tolerated (patient at baseline using 2-3 L).Currently on3-4Lnasal cannula. -continue Mucomyst for chest congestion and continue use of flutter valve  2-chronic atrial fibrillation -Rate controlled currently -Continue Coumadin per pharmacy -Continue Cardizem -continue to monitor on telemetry.  3-Type 2 diabetes mellitus with kidney complication, without long-term current use of insulin (HCC) -Stable and controlled despite IV methylprednisolone use -While inpatient will hold oral hypoglycemic agents -Continue sliding scale insulin and Lantuswith doses that have been increased along with mealtime insulins that have  been added.  4-Essential hypertension -Stable overall -Continue current antihypertensive regimen.  5-chronic kidney disease is stage III b. -Appears to be stable -Patient appears euvolemic currently -will assess diuresis/urine output with oral lasix  6-gastroesophageal reflux disease -Continue PPI and Carafate.  7-history of chronic pancreatitis -Continue Creon -Patient reports no nausea or vomiting currently.  8-hyperlipidemia -Continue statins  9-urinary retention -Bladder scan demonstrated over 800 mL retained -Foley catheterremoved on 12/18, but failed voiding trial andtherefore, will keep in place until discharge; voiding trial in am; if fail will go home with foley and urology follow up.  10-chronic pain -Primarily in low back and neck along with hip -Continue adjusted oral narcotic medications -Continue lidocaine patch -Anusol gel for rectal pain  DVT prophylaxis:Chronically on Coumadinwith therapeutic INR-therapeutic Code Status:Full code Family Communication:No family at bedside, tried calling daughtermultiple timeswith no response Disposition Plan:Remains inpatient, continue treatment for CHF and COPD exacerbation; negative Covid 19 test; safe to use nebulizer management.Continue Foley catheter for now and consider voiding trial by tomorrow prior to discharge.  PT recommending SNF on discharge, but patient would like to go home with home health PT. assessed urine output after transition to oral diuretics; continue to wean oxygen down to baseline (2-3 L nasal cannula).  Consultants:  None  Procedures:  See below for x-ray reports  Antimicrobials:  Anti-infectives (From admission, onward)   Start     Dose/Rate Route Frequency Ordered Stop   07/25/19 1830  cefTRIAXone (ROCEPHIN) 2  g in sodium chloride 0.9 % 100 mL IVPB     2 g 200 mL/hr over 30 Minutes Intravenous Every 24 hours 07/25/19 1826 07/29/19 2146   07/25/19 1830  azithromycin  (ZITHROMAX) 500 mg in sodium chloride 0.9 % 250 mL IVPB     500 mg 250 mL/hr over 60 Minutes Intravenous Every 24 hours 07/25/19 1826 07/29/19 2316      Subjective: Patient continues slowly improving; still short of breath on exertion and is using 3-4 L nasal cannula supplementation.  Feeling weak and deconditioned.  No fever, no chest pain.  Complaining of right flank intermittent pain.  Objective: Vitals:   08/05/19 2036 08/06/19 0500 08/06/19 0744 08/06/19 1026  BP: 138/60   129/69  Pulse: 80     Resp: 20     Temp: 98 F (36.7 C)     TempSrc: Oral     SpO2: 96%  97%   Weight:  55.7 kg    Height:  5\' 2"  (1.575 m)      Intake/Output Summary (Last 24 hours) at 08/06/2019 1704 Last data filed at 08/06/2019 1214 Gross per 24 hour  Intake 600 ml  Output 700 ml  Net -100 ml   Filed Weights   08/04/19 0619 08/04/19 2159 08/06/19 0500  Weight: 63.6 kg 59.1 kg 55.7 kg    Examination: General exam: Alert, afebrile, no chest pain, no nausea, no vomiting.  Reports breathing improved.  Complaining of some intermittent right flank pain.  Feeling weak and deconditioned. Respiratory system: Using 3-4 L nasal cannula; decreased breath sounds at the bases, no using accessory muscles.  Expressing shortness of breath on exertion.   Cardiovascular system: RRR. No murmurs, rubs, gallops. Gastrointestinal system: Abdomen is nondistended, soft and nontender. No organomegaly or masses felt. Normal bowel sounds heard. Central nervous system: Alert and oriented. No focal neurological deficits. Extremities/skin: No cyanosis or clubbing; chronic stasis dermatitis appreciated bilaterally; no swelling on exam. Psychiatry: Judgement and insight appear normal. Mood & affect appropriate.    Data Reviewed: I have personally reviewed following labs and imaging studies  CBC: Recent Labs  Lab 07/31/19 0523 08/02/19 0542 08/03/19 0606 08/04/19 0652 08/05/19 0800  WBC 10.6* 12.4* 16.2* 11.7* 10.4    HGB 10.7* 11.2* 11.0* 10.8* 11.5*  HCT 35.8* 36.6 35.9* 35.3* 37.2  MCV 108.2* 107.3* 104.7* 108.0* 106.9*  PLT 315 286 277 249 209   Basic Metabolic Panel: Recent Labs  Lab 08/02/19 0542 08/03/19 0606 08/04/19 0652 08/05/19 0800 08/06/19 0724  NA 139 138 138 135 140  K 5.0 4.4 4.1 3.7 3.6  CL 101 99 97* 94* 96*  CO2 27 27 29  32 34*  GLUCOSE 147* 134* 179* 150* 134*  BUN 62* 63* 60* 69* 60*  CREATININE 1.20* 1.24* 1.16* 1.31* 1.00  CALCIUM 9.7 9.4 9.3 8.7* 9.3  MG 2.0 2.0 2.1 2.0  --    GFR: Estimated Creatinine Clearance: 34.3 mL/min (by C-G formula based on SCr of 1 mg/dL).  Coagulation Profile: Recent Labs  Lab 08/02/19 0542 08/03/19 0606 08/04/19 0652 08/05/19 0800 08/06/19 0724  INR 2.4* 2.5* 2.8* 2.7* 2.7*   CBG: Recent Labs  Lab 08/05/19 1116 08/05/19 1609 08/05/19 2158 08/06/19 1136 08/06/19 1655  GLUCAP 164* 159* 130* 146* 117*    Radiology Studies: DG CHEST PORT 1 VIEW  Result Date: 08/05/2019 CLINICAL DATA:  Shortness of breath.  Left pneumothorax. EXAM: PORTABLE CHEST 1 VIEW COMPARISON:  08/04/2019 FINDINGS: Left-sided pacemaker unchanged. Patient is rotated to the  left. Known small left-sided pneumothorax with slight interval improvement. Moderate stable opacification throughout the left lung. Likely associated component of left pleural fluid. Right lung is clear. Cardiomediastinal silhouette and remainder of the exam is unchanged. IMPRESSION: Slight interval improvement of a small left pneumothorax. Stable moderate opacification throughout the left lung likely with component of basilar pleural fluid/atelectasis. Electronically Signed   By: Marin Olp M.D.   On: 08/05/2019 05:13    Scheduled Meds: . arformoterol  15 mcg Nebulization BID  . budesonide (PULMICORT) nebulizer solution  0.5 mg Nebulization BID  . dextromethorphan-guaiFENesin  1 tablet Oral BID  . diltiazem  120 mg Oral Daily  . feeding supplement (PRO-STAT SUGAR FREE 64)  30 mL  Oral BID  . ferrous sulfate  325 mg Oral Q breakfast  . furosemide  40 mg Oral Daily  . Gerhardt's butt cream   Topical Daily  . hydrocortisone   Rectal BID  . insulin aspart  0-20 Units Subcutaneous TID WC  . insulin aspart  0-5 Units Subcutaneous QHS  . insulin aspart  3 Units Subcutaneous TID WC  . insulin glargine  7 Units Subcutaneous QHS  . lidocaine  1 patch Transdermal Q24H  . lipase/protease/amylase  36,000 Units Oral TID WC  . methylPREDNISolone (SOLU-MEDROL) injection  40 mg Intravenous Q12H  . multivitamin-lutein  1 capsule Oral Daily  . pantoprazole  40 mg Oral BID  . polyethylene glycol  17 g Oral Daily  . potassium chloride SA  20 mEq Oral Daily  . simvastatin  10 mg Oral Daily  . sucralfate  1 g Oral TID WC & HS  . warfarin  0.5 mg Oral ONCE-1800  . Warfarin - Pharmacist Dosing Inpatient   Does not apply q1800   Continuous Infusions:   LOS: 12 days    Time spent: 30 minutes    Barton Dubois, MD [facility] Pager [phone]  08/06/2019, 5:04 PM

## 2019-08-07 DIAGNOSIS — L89600 Pressure ulcer of unspecified heel, unstageable: Secondary | ICD-10-CM

## 2019-08-07 DIAGNOSIS — J939 Pneumothorax, unspecified: Secondary | ICD-10-CM

## 2019-08-07 DIAGNOSIS — I5033 Acute on chronic diastolic (congestive) heart failure: Secondary | ICD-10-CM

## 2019-08-07 LAB — CBC
HCT: 34.6 % — ABNORMAL LOW (ref 36.0–46.0)
Hemoglobin: 10.8 g/dL — ABNORMAL LOW (ref 12.0–15.0)
MCH: 32.9 pg (ref 26.0–34.0)
MCHC: 31.2 g/dL (ref 30.0–36.0)
MCV: 105.5 fL — ABNORMAL HIGH (ref 80.0–100.0)
Platelets: 204 10*3/uL (ref 150–400)
RBC: 3.28 MIL/uL — ABNORMAL LOW (ref 3.87–5.11)
RDW: 14.5 % (ref 11.5–15.5)
WBC: 12.3 10*3/uL — ABNORMAL HIGH (ref 4.0–10.5)
nRBC: 0 % (ref 0.0–0.2)

## 2019-08-07 LAB — BASIC METABOLIC PANEL
Anion gap: 10 (ref 5–15)
BUN: 60 mg/dL — ABNORMAL HIGH (ref 8–23)
CO2: 32 mmol/L (ref 22–32)
Calcium: 8.9 mg/dL (ref 8.9–10.3)
Chloride: 97 mmol/L — ABNORMAL LOW (ref 98–111)
Creatinine, Ser: 1.09 mg/dL — ABNORMAL HIGH (ref 0.44–1.00)
GFR calc Af Amer: 55 mL/min — ABNORMAL LOW (ref >60)
GFR calc non Af Amer: 47 mL/min — ABNORMAL LOW (ref >60)
Glucose, Bld: 109 mg/dL — ABNORMAL HIGH (ref 70–99)
Potassium: 3.5 mmol/L (ref 3.5–5.1)
Sodium: 139 mmol/L (ref 135–145)

## 2019-08-07 LAB — GLUCOSE, CAPILLARY
Glucose-Capillary: 95 mg/dL (ref 70–99)
Glucose-Capillary: 212 mg/dL — ABNORMAL HIGH (ref 70–99)
Glucose-Capillary: 112 mg/dL — ABNORMAL HIGH (ref 70–99)
Glucose-Capillary: 172 mg/dL — ABNORMAL HIGH (ref 70–99)

## 2019-08-07 LAB — PROTIME-INR
INR: 3.3 — ABNORMAL HIGH (ref 0.8–1.2)
Prothrombin Time: 33.5 seconds — ABNORMAL HIGH (ref 11.4–15.2)

## 2019-08-07 MED ORDER — PREDNISONE 20 MG PO TABS: ORAL_TABLET | ORAL | 0 refills | Status: DC

## 2019-08-07 MED ORDER — LINACLOTIDE 145 MCG PO CAPS: 145.0000 ug | ORAL_CAPSULE | Freq: Every day | ORAL | 1 refills | Status: AC

## 2019-08-07 NOTE — Discharge Summary (Signed)
Physician Discharge Summary  [name] JME:[id number] DOB: 1935/06/06 DOA: 07/25/2019  PCP: [name], FNP  Admit date: 07/25/2019 Discharge date: 08/07/2019  Time spent: 35 minutes  Recommendations for Outpatient Follow-up:  1. Repeat basic metabolic panel to follow across renal function 2. Repeat chest x-ray in 4 weeks to assess resolution of vascular congestion, infiltrate or pneumothorax.   Discharge Diagnoses:  Principal Problem:   Pneumonia Active Problems:   Type 2 diabetes mellitus with kidney complication, without long-term current use of insulin (HCC)   Essential hypertension   COPD GOLD O    COPD with acute exacerbation (HCC)   Acute on chronic diastolic HF (heart failure) (HCC)   CKD (chronic kidney disease), stage III (HCC)   Pressure injury of skin   Pneumothorax   Discharge Condition: Stable and improved.  Patient discharged home with instructions to follow-up with PCP in 10 days.  Home health services arranged at time of discharge.  Diet recommendation: Heart healthy/modified carbohydrate diet.  Filed Weights   08/04/19 0619 08/04/19 2159 08/06/19 0500  Weight: 63.6 kg 59.1 kg 55.7 kg    History of present illness:  As per H&P written by Dr. [name] on 07/25/2019 84 y.o.female,w hypertension, hyperlipidemia, Dm2, CKD stage 3, Anemia, h/o pacer, Pafib, CAD, mild- mod Aortic regurgitation, mild Mitral regurgitation, CHF (EF 55-60%) , h/o Covid-19 infection, Copd, c/o increase in dyspnea for the past 5 days, Pt has had cough w grey sputum, pt denies fever, chills, cp, palp, n/v, abd pain, diarrhea, brbpr, black stool,   In ED,  T 98.5, P 64, R 16, Bp 119/66 pox 95%on 4L  WT 46.3  CXR IMPRESSION: 1. Low lung volumes with patchy mixed interstitial and airspace opacity with a basilar predominance. Findings are suspicious for multifocal pneumonia or aspiration though mixed interstitial and alveolar edema could have a similar  appearance 2. Small right pleural effusion. 3. Stable cardiomegaly.  Na 142, K 3.7, Bun 34, creatinine 1.61 ASt 24, Alt 14 Wbc 7.5, Hgb 8.7, Plt 242 Trop 20 -> 18 BNP 790   COVID test negative   Hospital Course:  1-acute on chronic respiratory failure in the setting of multifocal pneumonia, COPD exacerbation and CHF exacerbation along with left sided, minimal, nontraumatic pneumothorax -Patient heart failure exacerbation is acute on chronic diastolic heart failure.  -euvolemic and using lasix 40mg  daily at discharge. -Follow repeat chest x-ray in a.m. to ensure resolution of pneumothorax which will need follow-up outpatient. -Continue daily weightsand low-sodium diet -For her COPD will continue PRN bronchodilators (inhalers and nebulizer); discharge on steroids tapering. Patient has completed antibiotic therapy.   -Wean off oxygen supplementation as tolerated (patient at baseline using 2-3 L).Currently on3Lnasal cannula. -continue use of flutter valve and resumption of symbicort.   2-chronic atrial fibrillation -Rate controlled currently -Continue Coumadin and cardizem -outpatient follow up with cardiology service as previously scheduled.   3-Type 2 diabetes mellitus with kidney complication, without long-term current use of insulin (HCC) -Stable and controlled despite use of steroids. -Will resume home oral hypoglycemic regimen at discharge -modified carb diet discussed with patient.   4-Essential hypertension -Stable overall -Continue current antihypertensive regimen.  5-chronic kidney disease is stage III b. -Appears to be stable -Patient appears euvolemic currently -will recommend BMET at follow up visit to assess renal function trend.  6-gastroesophageal reflux disease -Continue PPI and Carafate.  7-history of chronic pancreatitis -Continue Creon -Patient reports no nausea or vomiting currently.  8-hyperlipidemia -Continue statins  9-urinary  retention -Bladder scan demonstrated over  800 mL retained -Foley catheterremoved on 12/18, but failed voiding trial andagain voiding trial tried on 12/27, after 2 in and out catheterization replacement of foley ordered -will need outpatient follow up with urology service.  10-chronic pain -Primarily in low back and neck along with hip -Continue home narcotics regimen -Continue lidocaine patch -started on bowel regimen using linzes   11-physical deconditioning -Patient discharged home with home health services. -PT evaluation recommended a skilled nursing facility; patient and family has declined these recommendations at this time.  Procedures:  See below for x-ray reports   Consultations:  None   Discharge Exam: Vitals:   08/07/19 0535 08/07/19 0731  BP: (!) 141/78   Pulse: 88   Resp: 20   Temp: 99.2 F (37.3 C)   SpO2: 95% 96%     Discharge Instructions   Discharge Instructions    (HEART FAILURE PATIENTS) Call MD:  Anytime you have any of the following symptoms: 1) 3 pound weight gain in 24 hours or 5 pounds in 1 week 2) shortness of breath, with or without a dry hacking cough 3) swelling in the hands, feet or stomach 4) if you have to sleep on extra pillows at night in order to breathe.   Complete by: As directed    Diet - low sodium heart healthy   Complete by: As directed    Discharge instructions   Complete by: As directed    Maintain adequate hydration Follow low-sodium diet (less than 2 g daily) Check weight on daily basis Follow-up with cardiology service as previously scheduled Arrange follow-up with PCP in 10 days Follow-up with urology as instructed Follow recommendation by home health services (nurse and physical therapist) to continue assisting with rehabilitation and proper care of Foley catheter.   Increase activity slowly   Complete by: As directed      Allergies as of 08/07/2019      Reactions   Chlorhexidine    Macrobid [nitrofurantoin  Monohyd Macro] Rash   Torsemide Nausea And Vomiting   Tramadol Nausea Only      Medication List    STOP taking these medications   ciprofloxacin 500 MG tablet Commonly known as: Cipro   omeprazole 40 MG capsule Commonly known as: PRILOSEC     TAKE these medications   budesonide-formoterol 160-4.5 MCG/ACT inhaler Commonly known as: Symbicort Inhale 2 puffs into the lungs 2 (two) times daily.   clonazePAM 0.5 MG tablet Commonly known as: KLONOPIN TAKE 1 TABLET 3 TIMES DAILY AS NEEDED FOR ANXIETY What changed:   how much to take  how to take this  when to take this  reasons to take this  additional instructions   Creon 36000 UNITS Cpep capsule Generic drug: lipase/protease/amylase TAKE 3 CAPSULES WITH MEALS  AND TAKE 1 CAPSULE WITH A SNACK AS DIRECTED   diltiazem 120 MG tablet Commonly known as: CARDIZEM Take 1 tablet (120 mg total) by mouth daily.   ferrous sulfate 325 (65 FE) MG tablet Take 1 tablet (325 mg total) by mouth daily with breakfast.   furosemide 40 MG tablet Commonly known as: LASIX Take 1 tablet (40 mg total) by mouth daily.   HYDROcodone-acetaminophen 7.5-325 MG tablet Commonly known as: NORCO Take 1 tablet by mouth 3 (three) times daily as needed (pain).   ipratropium-albuterol 0.5-2.5 (3) MG/3ML Soln Commonly known as: DUONEB INHALE THE CONTENTS OF 1 VIAL VIA NEBULIZER EVERY 4 HOURS AS NEEDED What changed:   how much to take  how to take this  when to take this  reasons to take this  additional instructions   linaclotide 145 MCG Caps capsule Commonly known as: Linzess Take 1 capsule (145 mcg total) by mouth daily before breakfast.   nitroGLYCERIN 0.4 MG SL tablet Commonly known as: NITROSTAT DISSOLVE 1 TABLET UNDER THE TONGUE EVERY 5 MINUTES AS NEEDED FOR CHEST PAIN AS DIRECTED What changed: See the new instructions.   OXYGEN Inhale 2 L into the lungs at bedtime. 2lpm with sleep only   pantoprazole 40 MG tablet Commonly  known as: PROTONIX Take 1 tablet (40 mg total) by mouth 2 (two) times daily.   potassium chloride SA 20 MEQ tablet Commonly known as: KLOR-CON Take 20 mEq by mouth daily.   predniSONE 20 MG tablet Commonly known as: Deltasone Take 3 tablets by mouth daily x1 day; then 2 tablets by mouth daily x3 days; then 1 tablet by mouth daily x3 days; then half tablet by mouth daily x3 days and stop prednisone.   PreserVision AREDS 2 Caps Take 1 capsule by mouth daily.   simvastatin 10 MG tablet Commonly known as: ZOCOR Take 1 tablet (10 mg total) by mouth daily.   sitaGLIPtin 50 MG tablet Commonly known as: Januvia TAKE 1 TABLET (50 MG TOTAL) BY MOUTH DAILY. What changed:   how much to take  how to take this  when to take this  additional instructions   sucralfate 1 GM/10ML suspension Commonly known as: Carafate Take 10 mLs (1 g total) by mouth 4 (four) times daily -  with meals and at bedtime.   Ventolin HFA 108 (90 Base) MCG/ACT inhaler Generic drug: albuterol USE 2 PUFFS EVERY 6 HOURS AS NEEDED What changed: See the new instructions.   warfarin 2 MG tablet Commonly known as: COUMADIN Take as directed. If you are unsure how to take this medication, talk to your nurse or doctor. Original instructions: Take 0.5 tablets (1 mg total) by mouth every evening. TAKE 1/2  TABLET (1 MG TOTAL) DAILY AT 6 PM.Get INR check by PCP the day after you are discharged      Allergies  Allergen Reactions  . Chlorhexidine   . Macrobid [Nitrofurantoin Monohyd Macro] Rash  . Torsemide Nausea And Vomiting  . Tramadol Nausea Only   Follow-up Information    [name], FNP. Schedule an appointment as soon as possible for a visit in 10 day(s).   Specialty: Family Medicine Contact information: [place] [address] [phone]        Herminio Commons, MD .   Specialty: Cardiology Contact information: [place]  [id number] [phone]        Thompson Grayer, MD .   Specialty: Cardiology Contact information: [place] [address] [phone]            The results of significant diagnostics from this hospitalization (including imaging, microbiology, ancillary and laboratory) are listed below for reference.    Significant Diagnostic Studies: DG Abd 1 View  Result Date: 08/04/2019 CLINICAL DATA:  Abdominal pain. EXAM: ABDOMEN - 1 VIEW COMPARISON:  05/12/2019 FINDINGS: The bowel gas pattern is normal. Moderate stool in the rectum. No visible free air on the supine radiograph. No acute bone abnormality. Aortic atherosclerosis. Multiple surgical clips and mesh attachment devices present over the abdomen, unchanged. Lucency at the left lung base laterally probably represents the pneumothorax seen on the chest x-ray of this same date. IMPRESSION: 1. Benign-appearing abdomen. 2.  Aortic Atherosclerosis (ICD10-I70.0). 3. Lucency at the left lung base probably represents the pneumothorax seen on the prior chest x-ray of this same date. Electronically Signed   By: Lorriane Shire M.D.   On: 08/04/2019 12:46   DG CHEST PORT 1 VIEW  Result Date: 08/05/2019 CLINICAL DATA:  Shortness of breath.  Left pneumothorax. EXAM: PORTABLE CHEST 1 VIEW COMPARISON:  08/04/2019 FINDINGS: Left-sided pacemaker unchanged. Patient is rotated to the left. Known small left-sided pneumothorax with slight interval improvement. Moderate stable opacification throughout the left lung. Likely associated component of left pleural fluid. Right lung is clear. Cardiomediastinal silhouette and remainder of the exam is unchanged. IMPRESSION: Slight interval improvement of a small left pneumothorax. Stable moderate opacification throughout the left lung likely with component of basilar pleural fluid/atelectasis. Electronically Signed   By: Marin Olp M.D.   On: 08/05/2019 05:13   DG CHEST PORT 1 VIEW  Result Date:  08/04/2019 CLINICAL DATA:  Abdominal pain. EXAM: PORTABLE CHEST 1 VIEW COMPARISON:  Chest x-ray dated 07/31/2019 FINDINGS: The patient has a new approximately 15% left apical pneumothorax with prominent atelectasis in the left upper lobe. There is slightly improved aeration at the left lung base since the prior study although peripheral density persists which is probably loculated pleural fluid. The right pleural effusion seen on the prior chest x-ray has resolved. No consolidative infiltrates on the right. There is slight hazy accentuation of the interstitial markings on the right. Pulmonary vascularity is normal. Aortic atherosclerosis. Pacemaker in place. No acute bone abnormality. IMPRESSION: 1. New approximately 15% left apical pneumothorax. 2. Slightly improved aeration at the left lung base since the prior study. 3. Hazy accentuation of the interstitial markings in the right lung. 4.  Aortic Atherosclerosis (ICD10-I70.0). Critical Value/emergent results were called by telephone at the time of interpretation on 08/04/2019 at 12:41 pm to Jackelyn Poling, [place] , who verbally acknowledged these results. Electronically Signed   By: Lorriane Shire M.D.   On: 08/04/2019 12:43   DG CHEST PORT 1 VIEW  Result Date: 07/31/2019 CLINICAL DATA:  Cough. EXAM: PORTABLE CHEST 1 VIEW COMPARISON:  07/25/2019. FINDINGS: Cardiac pacer with lead tip over the right ventricle. Cardiomegaly. Diffuse bilateral pulmonary infiltrates/edema and bilateral pleural effusions. Findings have from prior exam. No pneumothorax. Sclerotic changes right proximal humerus again noted consistent old infarct. Degenerative changes left shoulder and thoracic spine again noted. IMPRESSION: 1. Cardiac pacer with lead tip over the right ventricle in stable position. Cardiomegaly. 2. Diffuse bilateral pulmonary infiltrates/edema bilateral pleural effusions. Findings suggest CHF. Bilateral pneumonia cannot be excluded. Findings have progressed from prior exam.  Electronically Signed   By: Marcello Moores  Register   On: 07/31/2019 15:14   DG Chest Port 1 View  Result Date: 07/25/2019 CLINICAL DATA:  Shortness of breath, history of CHF and COPD on home O2 EXAM: PORTABLE CHEST 1 VIEW COMPARISON:  Most recent radiograph 05/10/2019 FINDINGS: Lung volumes are diminished with patchy mixed interstitial and airspace opacity with a basilar predominance. Small right pleural effusion is seen. Cardiac silhouette appears enlarged though is partially obscured by basilar opacity. The aorta is calcified. No acute osseous or soft tissue abnormality. Degenerative changes are present in the imaged spine and shoulders. Stable sclerotic lesion in the right proximal humerus. IMPRESSION: 1. Low lung volumes with patchy mixed interstitial and airspace opacity with a basilar predominance. Findings are suspicious for multifocal pneumonia or aspiration though mixed interstitial and alveolar edema could have a similar appearance 2. Small right pleural effusion. 3. Stable cardiomegaly. 4.  Aortic Atherosclerosis (ICD10-I70.0). Electronically Signed   By: [name] M.D.   On: 07/25/2019 16:35    Microbiology: No results found for this or any previous visit (from the past 240 hour(s)).   Labs: Basic Metabolic Panel: Recent Labs  Lab 08/02/19 0542 08/03/19 0606 08/04/19 0652 08/05/19 0800 08/06/19 0724 08/07/19 0736  NA 139 138 138 135 140 139  K 5.0 4.4 4.1 3.7 3.6 3.5  CL 101 99 97* 94* 96* 97*  CO2 27 27 29  32 34* 32  GLUCOSE 147* 134* 179* 150* 134* 109*  BUN 62* 63* 60* 69* 60* 60*  CREATININE 1.20* 1.24* 1.16* 1.31* 1.00 1.09*  CALCIUM 9.7 9.4 9.3 8.7* 9.3 8.9  MG 2.0 2.0 2.1 2.0  --   --    CBC: Recent Labs  Lab 08/02/19 0542 08/03/19 0606 08/04/19 0652 08/05/19 0800 08/07/19 0736  WBC 12.4* 16.2* 11.7* 10.4 12.3*  HGB 11.2* 11.0* 10.8* 11.5* 10.8*  HCT 36.6 35.9* 35.3* 37.2 34.6*  MCV 107.3* 104.7* 108.0* 106.9* 105.5*  PLT 286 277 249 239 204    BNP (last  3 results) Recent Labs    05/13/19 0300 05/14/19 0408 07/25/19 1604  BNP 170.8* 325.3* 790.0*    CBG: Recent Labs  Lab 08/05/19 2158 08/06/19 1136 08/06/19 1655 08/07/19 0720 08/07/19 1141  GLUCAP 130* 146* 117* 95 212*    Signed:  Barton Dubois MD.  [facility] 08/07/2019, 4:01 PM

## 2019-08-07 NOTE — Progress Notes (Signed)
Was advised by the [facility] that patient's daughter had been contacted and would be here by 2200 to pick patient up. All patients meds will be administered before discharge. Discharge papers have been printed.

## 2019-08-07 NOTE — TOC Transition Note (Signed)
Transition of Care [facility]) - CM/SW Discharge Note   Patient Details  Name: [name] MRN: [id number] Date of Birth: [birth year]  [facility] CM/SW Contact:  Ihor Gully, LCSW Phone Number: 08/07/2019, 4:26 PM   Clinical Narrative:    Vaughan Basta with [facility] notified of patient's discharge today. TOC signing off.    Final next level of care: [place] Barriers to Discharge: Continued Medical Work up   Patient Goals and CMS Choice Patient states their goals for this hospitalization and ongoing recovery are:: Patient wants to be discharged.      Discharge Placement                  Name of family member notified: Message left for granddaughter, Tawni Pummel. No answer from daughter listed on chart after multiple calls. Patient and family notified of of transfer: 08/07/19  Discharge Plan and Services                          HH Arranged: RN, [facility] Agency: [place] ([facility]) Date [place]: 08/01/19 Time [place]: 1355 Representative spoke with at [place]: [place] ([place]) Interventions     Readmission Risk Interventions Readmission Risk Prevention Plan 07/28/2019 05/14/2019 04/27/2019  Transportation Screening Complete Complete -  PCP or Specialist Appt within 3-5 Days - Complete Complete  HRI or [place] - Complete Complete  Social Work Consult for [place] Planning/Counseling - Complete -  [place] - Not Applicable -  Medication Review Press photographer) Complete Complete -  PCP or Specialist appointment within 3-5 days of discharge Not Complete - -  [place] or Home Care Consult Complete - -  Palliative Care Screening Not Complete - -  [place] Not Complete - -  Some recent data might be hidden

## 2019-08-07 NOTE — Care Plan (Signed)
Daughter notified of discharge; states she is unable to take pt home tonight, as she does not have anyone available to stay with her tomorrow while she works.

## 2019-08-07 NOTE — Progress Notes (Signed)
AC called to discuss discharge with patients daughter, Johnell Comings. [facility] [place]  Informed daughter that the MD attempted to contact her yesterday to inform her of discharge today with no response from her. Hilda Blades states she will work out care for her mother in the morning and come pick her up tonight.

## 2019-08-07 NOTE — Progress Notes (Signed)
ANTICOAGULATION CONSULT NOTE -   Pharmacy Consult for warfarin dosing  Indication: atrial fibrillation   Allergies  Allergen Reactions  . Chlorhexidine   . Macrobid [Nitrofurantoin Monohyd Macro] Rash  . Torsemide Nausea And Vomiting  . Tramadol Nausea Only      Patient Measurements: Last Weight  Most recent update: 08/06/2019  5:24 AM   Weight  55.7 kg (122 lb 12.7 oz)           Body mass index is 22.46 kg/m. [name]               Temp: 99.2 F (37.3 C) (12/28 0535) Temp Source: Oral (12/28 0535) BP: 141/78 (12/28 0535) Pulse Rate: 88 (12/28 0535)  Labs: Recent Labs    08/05/19 0800 08/06/19 0724 08/07/19 0736  HGB 11.5*  --  10.8*  HCT 37.2  --  34.6*  PLT 239  --  204  LABPROT 28.5* 28.4* 33.5*  INR 2.7* 2.7* 3.3*  CREATININE 1.31* 1.00 1.09*    Estimated Creatinine Clearance: 31.5 mL/min (A) (by C-G formula based on SCr of 1.09 mg/dL (H)).  Goal of Therapy:  INR 2-3 Monitor CBC     Prior to Admission Warfarin Dosing:  Palmer Shorey takes 1mg  of warfarin daily    Admit INR was 2.0 Lab Results  Component Value Date   INR 3.3 (H) 08/07/2019   INR 2.7 (H) 08/06/2019   INR 2.7 (H) 08/05/2019    Assessment: Finlee Concepcion a 84 y.o. female requires anticoagulation with warfarin for the indication of  Afib. Potential for drug drug interaction with azithromycin, will monitor closely. Warfarin will be initiated inpatient following pharmacy protocol per pharmacy consult. Patient most recent blood work is as follows:   CBC Latest Ref Rng & Units 08/07/2019 08/05/2019 08/04/2019  WBC 4.0 - 10.5 K/uL 12.3(H) 10.4 11.7(H)  Hemoglobin 12.0 - 15.0 g/dL 10.8(L) 11.5(L) 10.8(L)  Hematocrit 36.0 - 46.0 % 34.6(L) 37.2 35.3(L)  Platelets 150 - 400 K/uL 204 239 249    INR 3.3  Plan: Hold warfarin x 1 dose. Monitor CBC qmwf with am labs   Monitor INR daily Monitor for signs and symptoms of bleeding   Margot Ables, PharmD Clinical  Pharmacist 08/07/2019 1:15 PM

## 2019-08-07 NOTE — Progress Notes (Signed)
Received a call from the patient's granddaughter questioning why her grandmother had to be discharged at 2200 in the evening. I advised her that her grandmother's discharge orders had been in since 1630 this afternoon and that there were attempts to have someone pick her up then but her mother could not pick her up today. She expressed that she was a CNA and did not feel comfortable picking her up this late at night, she has no problems picking her up in the morning but if the patient could not walk, that would pose further problems.   My opinion is patient should be considered for skilled placement as to have full time availability of care. The granddaughter's contact number was forwarded to the [facility]. Pt is in her room, resting comfortably. All meds given, all belongings in a patient bag. Will continue to monitor the patient pending discharge.

## 2019-08-07 NOTE — Progress Notes (Signed)
Patient unable to urinate by self. Bladder scanned patient to result that more than 550 of urine was retained. I&O catheterized patient emptying 700. Patient tolerated procedure well. Continue to monitor. Patient educated on importance of oral fluids, water, but to be mindful of fluid restrictions.

## 2019-08-08 LAB — GLUCOSE, CAPILLARY
Glucose-Capillary: 170 mg/dL — ABNORMAL HIGH (ref 70–99)
Glucose-Capillary: 147 mg/dL — ABNORMAL HIGH (ref 70–99)
Glucose-Capillary: 137 mg/dL — ABNORMAL HIGH (ref 70–99)

## 2019-08-08 LAB — PROTIME-INR
INR: 3 — ABNORMAL HIGH (ref 0.8–1.2)
Prothrombin Time: 31.2 seconds — ABNORMAL HIGH (ref 11.4–15.2)

## 2019-08-08 NOTE — Progress Notes (Signed)
Pt grand daughter called back after speaking with AC. She states that she spoke with someone named "Stanton Kidney" who advised that it was not safe to pick her grandmother up tonight with it being late, cold and her grandmother having pneumonia. She inquired as to what time she could pick her grandmother up in the morning. Advised patient that the front door is open at 0600. Pt granddaughter advised that she would be here between 10-11 to pick her up. She stated that if patient could not walk, that was going to be an issue.  **See notes from PT**

## 2019-08-08 NOTE — Progress Notes (Signed)
Patient d/c home at this time with grand daughter verbalized understanding.

## 2019-08-08 NOTE — Progress Notes (Signed)
Patient seen and examined.  Has remained hemodynamically stable for discharge.  Some social difficulties prevented her to get home as plan on 08/07/2019.  Family again contacted and updated by social worker with anticipated plan to pick patient up later today.  Discharge summary and previous prescription has been reviewed and no changes needed.  Home health services has been arrange prior to discharge.  Please refer to discharge summary dictated on 08/07/2019 for further info/details and recommendations. Patient will be discharged home with Foley catheter in place secondary to acute urinary retention that have failed voiding trial and outpatient urology follow-up has been scheduled.  No acute complaints.  Barton Dubois MD [phone]

## 2019-08-08 NOTE — Clinical Social Work Note (Signed)
Patient's granddaughter indicates that patient someone is enroute to provide care for her 73 and 55 year olds and once they arrive she will be on the way to pick patient up.     Temeka Pore, Clydene Pugh, LCSW

## 2019-08-08 NOTE — Progress Notes (Signed)
Patient refusing to turn and reposition

## 2019-08-11 ENCOUNTER — Inpatient Hospital Stay (HOSPITAL_COMMUNITY)
Admission: EM | Admit: 2019-08-11 | Discharge: 2019-08-16 | DRG: 445 | Disposition: A | Discharge status: Skilled Nursing Facility | Payer: Medicare HMO | Attending: Family Medicine | Admitting: Family Medicine

## 2019-08-11 ENCOUNTER — Encounter (HOSPITAL_COMMUNITY): Payer: Self-pay | Admitting: Emergency Medicine

## 2019-08-11 ENCOUNTER — Emergency Department (HOSPITAL_COMMUNITY): Payer: Medicare HMO

## 2019-08-11 ENCOUNTER — Other Ambulatory Visit: Payer: Self-pay

## 2019-08-11 DIAGNOSIS — J9611 Chronic respiratory failure with hypoxia: Secondary | ICD-10-CM | POA: Diagnosis present

## 2019-08-11 DIAGNOSIS — R7401 Elevation of levels of liver transaminase levels: Secondary | ICD-10-CM | POA: Diagnosis present

## 2019-08-11 DIAGNOSIS — J939 Pneumothorax, unspecified: Secondary | ICD-10-CM | POA: Diagnosis present

## 2019-08-11 DIAGNOSIS — K838 Other specified diseases of biliary tract: Secondary | ICD-10-CM

## 2019-08-11 DIAGNOSIS — J449 Chronic obstructive pulmonary disease, unspecified: Secondary | ICD-10-CM | POA: Diagnosis present

## 2019-08-11 DIAGNOSIS — I5032 Chronic diastolic (congestive) heart failure: Secondary | ICD-10-CM | POA: Diagnosis present

## 2019-08-11 DIAGNOSIS — Z7901 Long term (current) use of anticoagulants: Secondary | ICD-10-CM

## 2019-08-11 DIAGNOSIS — I130 Hypertensive heart and chronic kidney disease with heart failure and stage 1 through stage 4 chronic kidney disease, or unspecified chronic kidney disease: Secondary | ICD-10-CM | POA: Diagnosis present

## 2019-08-11 DIAGNOSIS — R1310 Dysphagia, unspecified: Secondary | ICD-10-CM | POA: Diagnosis present

## 2019-08-11 DIAGNOSIS — Z823 Family history of stroke: Secondary | ICD-10-CM

## 2019-08-11 DIAGNOSIS — Z9981 Dependence on supplemental oxygen: Secondary | ICD-10-CM

## 2019-08-11 DIAGNOSIS — E1122 Type 2 diabetes mellitus with diabetic chronic kidney disease: Secondary | ICD-10-CM | POA: Diagnosis present

## 2019-08-11 DIAGNOSIS — Z950 Presence of cardiac pacemaker: Secondary | ICD-10-CM | POA: Diagnosis not present

## 2019-08-11 DIAGNOSIS — I2510 Atherosclerotic heart disease of native coronary artery without angina pectoris: Secondary | ICD-10-CM | POA: Diagnosis present

## 2019-08-11 DIAGNOSIS — D696 Thrombocytopenia, unspecified: Secondary | ICD-10-CM | POA: Diagnosis present

## 2019-08-11 DIAGNOSIS — K219 Gastro-esophageal reflux disease without esophagitis: Secondary | ICD-10-CM | POA: Diagnosis not present

## 2019-08-11 DIAGNOSIS — Z8616 Personal history of COVID-19: Secondary | ICD-10-CM | POA: Diagnosis present

## 2019-08-11 DIAGNOSIS — N1832 Chronic kidney disease, stage 3b: Secondary | ICD-10-CM | POA: Diagnosis present

## 2019-08-11 DIAGNOSIS — I4891 Unspecified atrial fibrillation: Secondary | ICD-10-CM | POA: Diagnosis present

## 2019-08-11 DIAGNOSIS — Z87440 Personal history of urinary (tract) infections: Secondary | ICD-10-CM

## 2019-08-11 DIAGNOSIS — I4821 Permanent atrial fibrillation: Secondary | ICD-10-CM | POA: Diagnosis present

## 2019-08-11 DIAGNOSIS — K8309 Other cholangitis: Principal | ICD-10-CM | POA: Diagnosis present

## 2019-08-11 DIAGNOSIS — D649 Anemia, unspecified: Secondary | ICD-10-CM | POA: Diagnosis present

## 2019-08-11 DIAGNOSIS — E860 Dehydration: Secondary | ICD-10-CM | POA: Diagnosis present

## 2019-08-11 DIAGNOSIS — E7800 Pure hypercholesterolemia, unspecified: Secondary | ICD-10-CM | POA: Diagnosis present

## 2019-08-11 DIAGNOSIS — E1121 Type 2 diabetes mellitus with diabetic nephropathy: Secondary | ICD-10-CM | POA: Diagnosis not present

## 2019-08-11 DIAGNOSIS — Z87891 Personal history of nicotine dependence: Secondary | ICD-10-CM | POA: Diagnosis not present

## 2019-08-11 DIAGNOSIS — E876 Hypokalemia: Secondary | ICD-10-CM | POA: Diagnosis present

## 2019-08-11 DIAGNOSIS — Z8051 Family history of malignant neoplasm of kidney: Secondary | ICD-10-CM

## 2019-08-11 DIAGNOSIS — I495 Sick sinus syndrome: Secondary | ICD-10-CM | POA: Diagnosis present

## 2019-08-11 DIAGNOSIS — Z8249 Family history of ischemic heart disease and other diseases of the circulatory system: Secondary | ICD-10-CM

## 2019-08-11 DIAGNOSIS — I10 Essential (primary) hypertension: Secondary | ICD-10-CM | POA: Diagnosis present

## 2019-08-11 DIAGNOSIS — Z801 Family history of malignant neoplasm of trachea, bronchus and lung: Secondary | ICD-10-CM

## 2019-08-11 DIAGNOSIS — R339 Retention of urine, unspecified: Secondary | ICD-10-CM | POA: Diagnosis present

## 2019-08-11 DIAGNOSIS — R7989 Other specified abnormal findings of blood chemistry: Secondary | ICD-10-CM

## 2019-08-11 DIAGNOSIS — E1129 Type 2 diabetes mellitus with other diabetic kidney complication: Secondary | ICD-10-CM | POA: Diagnosis present

## 2019-08-11 DIAGNOSIS — Z7951 Long term (current) use of inhaled steroids: Secondary | ICD-10-CM | POA: Diagnosis not present

## 2019-08-11 DIAGNOSIS — N183 Chronic kidney disease, stage 3 unspecified: Secondary | ICD-10-CM | POA: Diagnosis present

## 2019-08-11 DIAGNOSIS — R630 Anorexia: Secondary | ICD-10-CM | POA: Diagnosis present

## 2019-08-11 DIAGNOSIS — F419 Anxiety disorder, unspecified: Secondary | ICD-10-CM

## 2019-08-11 DIAGNOSIS — K5900 Constipation, unspecified: Secondary | ICD-10-CM | POA: Diagnosis present

## 2019-08-11 DIAGNOSIS — R54 Age-related physical debility: Secondary | ICD-10-CM | POA: Diagnosis present

## 2019-08-11 DIAGNOSIS — R531 Weakness: Secondary | ICD-10-CM

## 2019-08-11 LAB — COMPREHENSIVE METABOLIC PANEL
ALT: 183 U/L — ABNORMAL HIGH (ref 0–44)
AST: 73 U/L — ABNORMAL HIGH (ref 15–41)
Albumin: 3 g/dL — ABNORMAL LOW (ref 3.5–5.0)
Alkaline Phosphatase: 134 U/L — ABNORMAL HIGH (ref 38–126)
Anion gap: 10 (ref 5–15)
BUN: 43 mg/dL — ABNORMAL HIGH (ref 8–23)
CO2: 28 mmol/L (ref 22–32)
Calcium: 8.8 mg/dL — ABNORMAL LOW (ref 8.9–10.3)
Chloride: 104 mmol/L (ref 98–111)
Creatinine, Ser: 0.94 mg/dL (ref 0.44–1.00)
GFR calc Af Amer: >60 mL/min (ref >60)
GFR calc non Af Amer: 56 mL/min — ABNORMAL LOW (ref >60)
Glucose, Bld: 169 mg/dL — ABNORMAL HIGH (ref 70–99)
Potassium: 3 mmol/L — ABNORMAL LOW (ref 3.5–5.1)
Sodium: 142 mmol/L (ref 135–145)
Total Bilirubin: 1.4 mg/dL — ABNORMAL HIGH (ref 0.3–1.2)
Total Protein: 5.9 g/dL — ABNORMAL LOW (ref 6.5–8.1)

## 2019-08-11 LAB — CBC
HCT: 36.9 % (ref 36.0–46.0)
Hemoglobin: 11.3 g/dL — ABNORMAL LOW (ref 12.0–15.0)
MCH: 33.1 pg (ref 26.0–34.0)
MCHC: 30.6 g/dL (ref 30.0–36.0)
MCV: 108.2 fL — ABNORMAL HIGH (ref 80.0–100.0)
Platelets: 116 10*3/uL — ABNORMAL LOW (ref 150–400)
RBC: 3.41 MIL/uL — ABNORMAL LOW (ref 3.87–5.11)
RDW: 14.7 % (ref 11.5–15.5)
WBC: 13.1 10*3/uL — ABNORMAL HIGH (ref 4.0–10.5)
nRBC: 0 % (ref 0.0–0.2)

## 2019-08-11 LAB — URINALYSIS, ROUTINE W REFLEX MICROSCOPIC
Bilirubin Urine: NEGATIVE
Glucose, UA: NEGATIVE mg/dL
Ketones, ur: NEGATIVE mg/dL
Nitrite: NEGATIVE
Protein, ur: 100 mg/dL — AB
Specific Gravity, Urine: 1.023 (ref 1.005–1.030)
pH: 6 (ref 5.0–8.0)

## 2019-08-11 LAB — MAGNESIUM: Magnesium: 2.2 mg/dL (ref 1.7–2.4)

## 2019-08-11 LAB — RESPIRATORY PANEL BY RT PCR (FLU A&B, COVID)
Influenza A by PCR: NEGATIVE
Influenza B by PCR: NEGATIVE
SARS Coronavirus 2 by RT PCR: NEGATIVE

## 2019-08-11 LAB — CBG MONITORING, ED: Glucose-Capillary: 139 mg/dL — ABNORMAL HIGH (ref 70–99)

## 2019-08-11 LAB — BRAIN NATRIURETIC PEPTIDE: B Natriuretic Peptide: 584 pg/mL — ABNORMAL HIGH (ref 0.0–100.0)

## 2019-08-11 LAB — POC SARS CORONAVIRUS 2 AG -  ED: SARS Coronavirus 2 Ag: NEGATIVE

## 2019-08-11 MED ORDER — CLONAZEPAM 0.5 MG PO TABS
0.5000 mg | ORAL_TABLET | Freq: Three times a day (TID) | ORAL | Status: DC | PRN
  Administered 2019-08-12 – 2019-08-15 (×7): 0.5 mg via ORAL
  Filled 2019-08-11 (×7): qty 1

## 2019-08-11 MED ORDER — HYDROCODONE-ACETAMINOPHEN 7.5-325 MG PO TABS
1.0000 | ORAL_TABLET | Freq: Three times a day (TID) | ORAL | Status: DC | PRN
  Administered 2019-08-12 – 2019-08-16 (×10): 1 via ORAL
  Filled 2019-08-11 (×11): qty 1

## 2019-08-11 MED ORDER — DILTIAZEM HCL 60 MG PO TABS
120.0000 mg | ORAL_TABLET | Freq: Every day | ORAL | Status: DC
  Administered 2019-08-12 – 2019-08-16 (×5): 120 mg via ORAL
  Filled 2019-08-11 (×5): qty 2

## 2019-08-11 MED ORDER — SODIUM CHLORIDE 0.9 % IV SOLN
2.0000 g | INTRAVENOUS | Status: DC
  Administered 2019-08-11 – 2019-08-15 (×5): 2 g via INTRAVENOUS
  Filled 2019-08-11 (×5): qty 20

## 2019-08-11 MED ORDER — SIMVASTATIN 20 MG PO TABS
10.0000 mg | ORAL_TABLET | Freq: Every day | ORAL | Status: DC
  Administered 2019-08-12 – 2019-08-16 (×5): 10 mg via ORAL
  Filled 2019-08-11 (×5): qty 1

## 2019-08-11 MED ORDER — PANTOPRAZOLE SODIUM 40 MG PO TBEC
40.0000 mg | DELAYED_RELEASE_TABLET | Freq: Two times a day (BID) | ORAL | Status: DC
  Administered 2019-08-11 – 2019-08-14 (×6): 40 mg via ORAL
  Filled 2019-08-11 (×6): qty 1

## 2019-08-11 MED ORDER — SUCRALFATE 1 GM/10ML PO SUSP
1.0000 g | Freq: Three times a day (TID) | ORAL | Status: DC
  Administered 2019-08-11 – 2019-08-15 (×12): 1 g via ORAL
  Filled 2019-08-11 (×14): qty 10

## 2019-08-11 MED ORDER — ALBUTEROL SULFATE HFA 108 (90 BASE) MCG/ACT IN AERS
2.0000 | INHALATION_SPRAY | Freq: Four times a day (QID) | RESPIRATORY_TRACT | Status: DC | PRN
  Filled 2019-08-11: qty 6.7

## 2019-08-11 MED ORDER — POTASSIUM CHLORIDE CRYS ER 20 MEQ PO TBCR
40.0000 meq | EXTENDED_RELEASE_TABLET | Freq: Every day | ORAL | Status: DC
  Filled 2019-08-11: qty 2

## 2019-08-11 MED ORDER — ONDANSETRON HCL 4 MG PO TABS
4.0000 mg | ORAL_TABLET | Freq: Four times a day (QID) | ORAL | Status: DC | PRN
  Administered 2019-08-13 – 2019-08-16 (×2): 4 mg via ORAL
  Filled 2019-08-11 (×2): qty 1

## 2019-08-11 MED ORDER — ONDANSETRON HCL 4 MG/2ML IJ SOLN
4.0000 mg | Freq: Four times a day (QID) | INTRAMUSCULAR | Status: DC | PRN
  Administered 2019-08-16: 4 mg via INTRAVENOUS
  Filled 2019-08-11: qty 2

## 2019-08-11 MED ORDER — LINACLOTIDE 145 MCG PO CAPS
145.0000 ug | ORAL_CAPSULE | Freq: Every day | ORAL | Status: DC
  Administered 2019-08-12 – 2019-08-16 (×5): 145 ug via ORAL
  Filled 2019-08-11 (×8): qty 1

## 2019-08-11 MED ORDER — MOMETASONE FURO-FORMOTEROL FUM 200-5 MCG/ACT IN AERO
2.0000 | INHALATION_SPRAY | Freq: Two times a day (BID) | RESPIRATORY_TRACT | Status: DC
  Administered 2019-08-11 – 2019-08-16 (×10): 2 via RESPIRATORY_TRACT
  Filled 2019-08-11: qty 8.8

## 2019-08-11 MED ORDER — POTASSIUM CHLORIDE 10 MEQ/100ML IV SOLN
10.0000 meq | INTRAVENOUS | Status: AC
  Administered 2019-08-11: 10 meq via INTRAVENOUS
  Filled 2019-08-11: qty 100

## 2019-08-11 MED ORDER — HYDROCODONE-ACETAMINOPHEN 5-325 MG PO TABS
1.0000 | ORAL_TABLET | Freq: Once | ORAL | Status: AC
  Administered 2019-08-11: 18:00:00 1 via ORAL
  Filled 2019-08-11: qty 1

## 2019-08-11 MED ORDER — ALUM & MAG HYDROXIDE-SIMETH 200-200-20 MG/5ML PO SUSP
15.0000 mL | Freq: Once | ORAL | Status: AC
  Administered 2019-08-11: 15 mL via ORAL
  Filled 2019-08-11: qty 30

## 2019-08-11 MED ORDER — INSULIN ASPART 100 UNIT/ML ~~LOC~~ SOLN
0.0000 [IU] | Freq: Four times a day (QID) | SUBCUTANEOUS | Status: DC
  Administered 2019-08-11 – 2019-08-13 (×2): 2 [IU] via SUBCUTANEOUS
  Administered 2019-08-13: 3 [IU] via SUBCUTANEOUS
  Filled 2019-08-11: qty 1

## 2019-08-11 MED ORDER — METRONIDAZOLE IN NACL 5-0.79 MG/ML-% IV SOLN
500.0000 mg | Freq: Three times a day (TID) | INTRAVENOUS | Status: DC
  Administered 2019-08-11 – 2019-08-13 (×7): 500 mg via INTRAVENOUS
  Filled 2019-08-11 (×8): qty 100

## 2019-08-11 MED ORDER — POTASSIUM CHLORIDE CRYS ER 20 MEQ PO TBCR: 20.0000 meq | EXTENDED_RELEASE_TABLET | Freq: Every day | ORAL | Status: DC

## 2019-08-11 MED ORDER — POTASSIUM CHLORIDE CRYS ER 20 MEQ PO TBCR
40.0000 meq | EXTENDED_RELEASE_TABLET | Freq: Every day | ORAL | Status: DC
  Administered 2019-08-11 – 2019-08-15 (×5): 40 meq via ORAL
  Filled 2019-08-11 (×5): qty 2

## 2019-08-11 MED ORDER — FUROSEMIDE 40 MG PO TABS
40.0000 mg | ORAL_TABLET | Freq: Every day | ORAL | Status: DC
  Administered 2019-08-12: 11:00:00 40 mg via ORAL
  Filled 2019-08-11 (×2): qty 1

## 2019-08-11 NOTE — ED Notes (Signed)
Pt with liquid stool  Changed and noted to buttock area MASD  Pt repositioned  Comfort measures

## 2019-08-11 NOTE — ED Notes (Signed)
Call to [place] to inform covid pos

## 2019-08-11 NOTE — ED Notes (Signed)
Report to Dellis Filbert, [place]

## 2019-08-11 NOTE — ED Notes (Signed)
Dr Nehemiah Settle is paged regarding pt pulling out IV and IV is 22 in back of hand lending question whether or not it will tolerate her further K runs

## 2019-08-11 NOTE — ED Notes (Signed)
Pt put on a wrap pulse ox due to low reading   O at 2L [place]   Pulse ox now 100 per cent

## 2019-08-11 NOTE — ED Notes (Signed)
Pt reports she is here because she cannot be cared for at home by here daughter and granddaughter and her granddaughter has brought her here

## 2019-08-11 NOTE — ED Notes (Signed)
Family does not respond to calls

## 2019-08-11 NOTE — ED Triage Notes (Signed)
Poor historian, states she is unable to move, vary weak, seen at [facility] for same last night

## 2019-08-11 NOTE — ED Notes (Signed)
Pt with small stool   She is changed with new bed linens and gowned

## 2019-08-11 NOTE — H&P (Signed)
History and Physical  [name] OAC:[id number] DOB: 26-Mar-1935 DOA: 08/11/2019  Referring physician: Estelle Grumbles, MD, ED physician PCP: [name], [place]  Outpatient Specialists:  Patient Coming From: home  Chief Complaint: RUQ abdominal pain  HPI: [name] is a 85 y.o. female with a history of coronary artery disease, permanent atrial fibrillation on Coumadin, [employer] Jude pacemaker, GERD, hypertension, stage II chronic kidney disease, COPD with chronic respiratory failure, history of Covid pneumonia in September 2020.  Patient history of cholecystectomy in the distant past.  Patient presents with right upper quadrant pain that started 2 to 3 days ago.  Has diminished appetite.  No radiation to her pain.  Pain worse with eating and improved with fasting.  No palliating or provoking factors.  Denies nausea, vomiting, fevers.  Pain not improved or worsened with defecating.  Emergency Department Course: LFTs mildly elevated: AST 73, ALT 183, bilirubin 1.4.  White count 13.1.  Right upper quadrant ultrasound shows a dilated bile duct of 2.3 cm.  Review of Systems:   Pt denies any fevers, chills, nausea, vomiting, diarrhea, constipation, shortness of breath, dyspnea on exertion, orthopnea, cough, wheezing, palpitations, headache, vision changes, lightheadedness, dizziness, melena, rectal bleeding.  Review of systems are otherwise negative  Past Medical History:  Diagnosis Date  . Abnormal CT of the chest    Mediastinal and hilar adenopathy followed by Dr. Koleen Nimrod in [place]  . Anemia, unspecified   . Body mass index (BMI) of 20.0-20.9 in adult FEB 2013 147 LBS  . CAD (coronary artery disease) 2011   Catheterization August 2011: mild nonobstructive coronary artery disease complicated by large left rectus sheath hematoma status post evacuation Coumadin restarted without recurrence  . Carotid artery disease ([place])    Doppler, 05/2012, 0-39% bilateral  . Cataract   . Chronic  airway obstruction, not elsewhere classified   . Chronic kidney disease, stage II (mild)   . Congestive heart failure, unspecified    EF now 60%, previous nonischemic cardiomyopathy  . COVID-19   . Diabetes mellitus   . Dilated bile duct 10/19/2011  . Diverticulitis Dec 2012  . Gastritis    By EGD  . GERD (gastroesophageal reflux disease)   . Hypercholesterolemia   . Hypertension   . Mitral regurgitation 06/27/2013  . Neurogenic sleep apnea    Uses noctural oxygen prescribed by her pulmonologi  . Pacemaker    Single chamber [place]. Jude ACCENT [phone] SN 719 04/11/1959 10/31/2010  . Permanent atrial fibrillation (HCC)    H/o tachybradycardia syndrome on Coumadin anticoagulation, has pacemaker.  . Recurrent UTI   . Rheumatic heart disease   . Tachycardia-bradycardia [facility])    s/p St. Jude single chamber PPM 10/2010   . Unspecified hypertensive kidney disease with chronic kidney disease stage I through stage IV, or unspecified(403.90)   . Valvular heart disease    a. H/o moderate mitral stenosis by cath 2011 but no mention of this on 10/2011 echo. b. 10/2011 echo: mod MR, mild AS, mild TR; EF>65%  . Vitamin D deficiency    Past Surgical History:  Procedure Laterality Date  . CHOLECYSTECTOMY     40+ years ago  . COLONOSCOPY  10/2011   Dyann Ruddle sigmoid to descending diverticulosis, internal hemorrhoids  . ESOPHAGOGASTRODUODENOSCOPY  10/2011   Fields-erosive gastritis, biopsy with no H. pylori, hiatal hernia, peptic duodenitis, no celiac disease, duodenal diverticulum, duodenal nodule  . EUS  11/16/11   Mishra-13 MM CBD, normal pancreatic duct, otherwise normal EUS  . Evacuation of retroperitoneal and  left rectus sheath    . HEMATOMA EVACUATION     femoral  . HERNIA REPAIR     X3  . PILONIDAL CYST / SINUS EXCISION    . Single-chamber pacemaker implantation  3/12   by Dr Caryl Comes   Social History:  reports that she quit smoking about 3 months ago. Her smoking use included cigarettes.  She started smoking about 61 years ago. She has a 11.25 pack-year smoking history. She has never used smokeless tobacco. She reports that she does not drink alcohol or use drugs. Patient lives at home  Allergies  Allergen Reactions  . Chlorhexidine   . Macrobid [Nitrofurantoin Monohyd Macro] Rash  . Torsemide Nausea And Vomiting  . Tramadol Nausea Only    Family History  Problem Relation Age of Onset  . Cancer Mother        cervical  . Stroke Mother   . Cancer Daughter 66       kidney, lung  . Heart disease Father   . Heart attack Father   . Ulcers Father   . Early death Sister 1       died at 42 months old  . Hypertension Brother   . Cancer Paternal Aunt        breast  . Hypertension Son   . Colon cancer Neg Hx       Prior to Admission medications   Medication Sig Start Date End Date Taking? Authorizing Provider  budesonide-formoterol (SYMBICORT) 160-4.5 MCG/ACT inhaler Inhale 2 puffs into the lungs 2 (two) times daily. 01/13/19   Hassell Done, Mary-Margaret, FNP  clonazePAM (KLONOPIN) 0.5 MG tablet TAKE 1 TABLET 3 TIMES DAILY AS NEEDED FOR ANXIETY Patient taking differently: Take 0.5 mg by mouth 3 (three) times daily as needed for anxiety.  05/09/19   [name], FNP  CREON 36000 units CPEP capsule TAKE 3 CAPSULES WITH MEALS  AND TAKE 1 CAPSULE WITH A SNACK AS DIRECTED 06/02/19   Hassell Done, Mary-Margaret, FNP  diltiazem (CARDIZEM) 120 MG tablet Take 1 tablet (120 mg total) by mouth daily. 05/09/19   [name], FNP  ferrous sulfate 325 (65 FE) MG tablet Take 1 tablet (325 mg total) by mouth daily with breakfast. 10/29/15   Cherre Robins, PharmD  furosemide (LASIX) 40 MG tablet Take 1 tablet (40 mg total) by mouth daily. 06/19/19   Hassell Done, Mary-Margaret, FNP  HYDROcodone-acetaminophen (NORCO) 7.5-325 MG tablet Take 1 tablet by mouth 3 (three) times daily as needed (pain).  02/17/16   [provider]  ipratropium-albuterol (DUONEB) 0.5-2.5 (3) MG/3ML SOLN INHALE  THE CONTENTS OF 1 VIAL VIA NEBULIZER EVERY 4 HOURS AS NEEDED Patient taking differently: Take 3 mLs by nebulization every 4 (four) hours as needed (for shortness of breath/wheezing).  04/27/19   Kathie Dike, MD  linaclotide Rolan Lipa) 145 MCG CAPS capsule Take 1 capsule (145 mcg total) by mouth daily before breakfast. 08/07/19   Barton Dubois, MD  Multiple Vitamins-Minerals (PRESERVISION AREDS 2) CAPS Take 1 capsule by mouth daily.     [provider]  nitroGLYCERIN (NITROSTAT) 0.4 MG SL tablet DISSOLVE 1 TABLET UNDER THE TONGUE EVERY 5 MINUTES AS NEEDED FOR CHEST PAIN AS DIRECTED Patient taking differently: Place 0.4 mg under the tongue every 5 (five) minutes as needed for chest pain.  04/28/19   Herminio Commons, MD  OXYGEN Inhale 2 L into the lungs at bedtime. 2lpm with sleep only     [provider]  pantoprazole (PROTONIX) 40 MG tablet Take 1 tablet (  40 mg total) by mouth 2 (two) times daily. 06/23/19   Hassell Done, Mary-Margaret, FNP  potassium chloride SA (KLOR-CON) 20 MEQ tablet Take 20 mEq by mouth daily.  06/18/19   [provider]  predniSONE (DELTASONE) 20 MG tablet Take 3 tablets by mouth daily x1 day; then 2 tablets by mouth daily x3 days; then 1 tablet by mouth daily x3 days; then half tablet by mouth daily x3 days and stop prednisone. 08/07/19   Barton Dubois, MD  simvastatin (ZOCOR) 10 MG tablet Take 1 tablet (10 mg total) by mouth daily. 05/09/19   Hassell Done, Mary-Margaret, FNP  sitaGLIPtin (JANUVIA) 50 MG tablet TAKE 1 TABLET (50 MG TOTAL) BY MOUTH DAILY. Patient taking differently: Take 50 mg by mouth daily.  05/09/19   Hassell Done, Mary-Margaret, FNP  sucralfate (CARAFATE) 1 GM/10ML suspension Take 10 mLs (1 g total) by mouth 4 (four) times daily -  with meals and at bedtime. 05/22/19   Hawks, Christy A, FNP  VENTOLIN HFA 108 (90 Base) MCG/ACT inhaler USE 2 PUFFS EVERY 6 HOURS AS NEEDED Patient taking differently: Inhale 2 puffs into the lungs every 6 (six) hours  as needed for wheezing or shortness of breath.  01/18/17   [name], FNP  warfarin (COUMADIN) 2 MG tablet Take 0.5 tablets (1 mg total) by mouth every evening. TAKE 1/2  TABLET (1 MG TOTAL) DAILY AT 6 PM.Get INR check by PCP the day after you are discharged 05/18/19   Thurnell Lose, MD    Physical Exam: BP (!) 159/81   Pulse 92   Temp 98.8 F (37.1 C)   Resp 20   Ht 5\' 2"  (1.575 m)   Wt 55.7 kg   SpO2 100%   BMI 22.46 kg/m   . General: Elderly female. Awake and alert and oriented x3. No acute cardiopulmonary distress.  [name] HEENT: Normocephalic atraumatic.  Right and left ears normal in appearance.  Pupils equal, round, reactive to light. Extraocular muscles are intact. Sclerae anicteric and noninjected.  Moist mucosal membranes. No mucosal lesions.  . Neck: Neck supple without lymphadenopathy. No carotid bruits. No masses palpated.  . Cardiovascular: Regular rate with normal S1-S2 sounds. No murmurs, rubs, gallops auscultated. No JVD.  [name] Respiratory: Good respiratory effort with no wheezes, rales, rhonchi. Lungs clear to auscultation bilaterally.  No accessory muscle use. . Abdomen: Soft, mild right upper quadrant tenderness with positive Murphy sign.  No rebound or guarding.  She does have a extremely large ventral hernia that is soft and nontender. nondistended. Active bowel sounds. No masses or hepatosplenomegaly  . Skin: No rashes, lesions, or ulcerations.  Dry, warm to touch. 2+ dorsalis pedis and radial pulses. . Musculoskeletal: No calf or leg pain. All major joints not erythematous nontender.  No upper or lower joint deformation.  Good ROM.  No contractures  . Psychiatric: Intact judgment and insight. Pleasant and cooperative. . Neurologic: No focal neurological deficits. Strength is 5/5 and symmetric in upper and lower extremities.  Cranial nerves II through XII are grossly intact.           Labs on Admission: I have personally reviewed following labs and imaging  studies  CBC: Recent Labs  Lab 08/05/19 0800 08/07/19 0736 08/11/19 1541  WBC 10.4 12.3* 13.1*  HGB 11.5* 10.8* 11.3*  HCT 37.2 34.6* 36.9  MCV 106.9* 105.5* 108.2*  PLT 239 204 500*   Basic Metabolic Panel: Recent Labs  Lab 08/05/19 0800 08/06/19 0724 08/07/19 0736 08/11/19 1541  NA 135  140 139 142  K 3.7 3.6 3.5 3.0*  CL 94* 96* 97* 104  CO2 32 34* 32 28  GLUCOSE 150* 134* 109* 169*  BUN 69* 60* 60* 43*  CREATININE 1.31* 1.00 1.09* 0.94  CALCIUM 8.7* 9.3 8.9 8.8*  MG 2.0  --   --  2.2   GFR: Estimated Creatinine Clearance: 36.5 mL/min (by C-G formula based on SCr of 0.94 mg/dL). Liver Function Tests: Recent Labs  Lab 08/11/19 1541  AST 73*  ALT 183*  ALKPHOS 134*  BILITOT 1.4*  PROT 5.9*  ALBUMIN 3.0*   No results for input(s): LIPASE, AMYLASE in the last 168 hours. No results for input(s): AMMONIA in the last 168 hours. Coagulation Profile: Recent Labs  Lab 08/05/19 0800 08/06/19 0724 08/07/19 0736 08/08/19 0535  INR 2.7* 2.7* 3.3* 3.0*   Cardiac Enzymes: No results for input(s): CKTOTAL, CKMB, CKMBINDEX, TROPONINI in the last 168 hours. BNP (last 3 results) No results for input(s): PROBNP in the last 8760 hours. HbA1C: No results for input(s): HGBA1C in the last 72 hours. CBG: Recent Labs  Lab 08/07/19 1141 08/07/19 1639 08/07/19 2109 08/08/19 0745 08/08/19 1201  GLUCAP 212* 112* 172* 147* 137*   Lipid Profile: No results for input(s): CHOL, HDL, LDLCALC, TRIG, CHOLHDL, LDLDIRECT in the last 72 hours. Thyroid Function Tests: No results for input(s): TSH, T4TOTAL, FREET4, T3FREE, THYROIDAB in the last 72 hours. Anemia Panel: No results for input(s): VITAMINB12, FOLATE, FERRITIN, TIBC, IRON, RETICCTPCT in the last 72 hours. Urine analysis:    Component Value Date/Time   COLORURINE YELLOW 08/11/2019 1513   APPEARANCEUR CLEAR 08/11/2019 1513   APPEARANCEUR Cloudy (A) 08/25/2017 1732   LABSPEC 1.023 08/11/2019 1513   PHURINE 6.0  08/11/2019 1513   GLUCOSEU NEGATIVE 08/11/2019 1513   HGBUR SMALL (A) 08/11/2019 1513   BILIRUBINUR NEGATIVE 08/11/2019 1513   BILIRUBINUR Negative 08/25/2017 1732   KETONESUR NEGATIVE 08/11/2019 1513   PROTEINUR 100 (A) 08/11/2019 1513   UROBILINOGEN negative 02/28/2015 1602   UROBILINOGEN 0.2 08/21/2013 1922   NITRITE NEGATIVE 08/11/2019 1513   LEUKOCYTESUR TRACE (A) 08/11/2019 1513   Sepsis Labs: @LABRCNTIP (procalcitonin:4,lacticidven:4) )No results found for this or any previous visit (from the past 240 hour(s)).   Radiological Exams on Admission: XR Chest Single View  Result Date: 08/11/2019 CLINICAL DATA:  Confusion. COVID-19 test pending. Diabetes. Hypertension. CHF. EXAM: PORTABLE CHEST 1 VIEW COMPARISON:  08/05/2019 FINDINGS: Right humeral head probable enchondroma. High riding bilateral humeral L it has, consistent with chronic rotator cuff insufficiency. Midline trachea. Moderate cardiomegaly. Single lead pacer. Atherosclerosis in the transverse aorta. Right costophrenic angle minimally excluded. No right-sided pleural effusion. No large left pleural effusion. Limited evaluation for left pleural fluid secondary to AP portable technique and cardiomegaly. 5% left apical pneumothorax, slightly increased. Diffuse interstitial thickening. Skin fold over the right hemithorax. Persistent left base opacity. IMPRESSION: Slight increase in 5% left apical pneumothorax. Improved left base aeration.  Favor residual atelectasis. Cardiomegaly with increased interstitial prominence. Favored to be technique related. No overt congestive failure. Electronically Signed   By: Abigail Miyamoto M.D.   On: 08/11/2019 15:55   US Abdomen Limited RUQ  Result Date: 08/11/2019 CLINICAL DATA:  Elevated liver function tests. EXAM: ULTRASOUND ABDOMEN LIMITED RIGHT UPPER QUADRANT COMPARISON:  CT 05/13/2019 5 CT 12/11/2018 FINDINGS: Gallbladder: Cholecystectomy Common bile duct: Diameter: Dilated intrahepatic ducts. The  common duct is dilated, including at 2.3 cm. Compare 1.5 cm on 05/13/2019 CT. Liver: No focal lesion identified. Within normal limits in  parenchymal echogenicity. Portal vein is patent on color Doppler imaging with normal direction of blood flow towards the liver. Other: None. IMPRESSION: Cholecystectomy. Intra and extrahepatic biliary duct dilatation. Although duct dilatation has been present back to a CT of 12/27/2017, it is progressive compared to the most recent CT. In the setting of elevated liver function tests, consider MRCP or ERCP to exclude choledocholithiasis. Electronically Signed   By: Abigail Miyamoto M.D.   On: 08/11/2019 17:14    EKG: Independently reviewed.  Paced sinus rhythm.  Likely old inferior and anterior infarct.  No acute ST changes.  Assessment/Plan: Active Problems:   Type 2 diabetes mellitus with kidney complication, without long-term current use of insulin (HCC)   Essential hypertension   COPD GOLD O    Unspecified atrial fibrillation (HCC)   Pacemaker-St.Jude   GERD (gastroesophageal reflux disease)   CKD (chronic kidney disease), stage III (HCC)   Chronic respiratory failure with hypoxia (HCC)   Cholangitis    This patient was discussed with the ED physician, including pertinent vitals, physical exam findings, labs, and imaging.  We also discussed care given by the ED provider.  Cholangitis  I discussed the patient with Dr. Silverio Decamp with low [facility].  She is on call this weekend.  She felt that the patient is safe to stay here and is not in need of emergent ERCP.  Patient does not qualify for MRCP secondary to pacemaker.  Start Rocephin and Flagyl  Trend LFTs.  Dr. Silverio Decamp would be happy to discuss LFT trend tomorrow.  COPD with chronic respiratory failure on home O2  Compensated  Diastolic heart failure  Compensated.  Continue Lasix  Hypokalemia  Check magnesium  Replace potassium  Recheck potassium in the morning  Atrial fibrillation on  chronic Coumadin with [facility] pacemaker  Not able to have MRI  We will hold Coumadin  Recheck INR tomorrow  No pharmacy consult for Coumadin management ordered at this time, given we are holding the Coumadin  GERD  On PPI  Hypertension  Continue home regimen  DVT prophylaxis: INR 3.3.  We will hold Coumadin at the present.  SCDs ordered Consultants:  Code Status: Full code Family Communication: None Disposition Plan: Pending   Truett Mainland, DO

## 2019-08-11 NOTE — ED Notes (Signed)
Meal provided 

## 2019-08-11 NOTE — ED Notes (Signed)
Dr B in to assess

## 2019-08-11 NOTE — ED Provider Notes (Signed)
[facility] Emergency Department Provider Note MRN:  [id number]  Arrival date & time: 08/11/19     Chief Complaint   Weakness History of Present Illness   [name] is a 85 y.o. year-old female with a history of CAD, COPD, CHF presenting to the ED with chief complaint of weakness.  Patient endorsing generalized weakness.  Recent discharge from outside hospital, [facility]  Denies cough, no chest pain, no shortness of breath, no headache, no vision change, no abdominal pain, no dysuria, no numbness or weakness to the arms or legs.  Symptoms moderate, constant, no exacerbating relieving factors.  Patient explains having trouble ambulating or standing up.  Review of Systems  A complete 10 system review of systems was obtained and all systems are negative except as noted in the HPI and PMH.   Patient's Health History    Past Medical History:  Diagnosis Date   Abnormal CT of the chest    Mediastinal and hilar adenopathy followed by Dr. Koleen Nimrod in [place]   Anemia, unspecified    Body mass index (BMI) of 20.0-20.9 in adult FEB 2013 147 LBS   CAD (coronary artery disease) 2011   Catheterization August 2011: mild nonobstructive coronary artery disease complicated by large left rectus sheath hematoma status post evacuation Coumadin restarted without recurrence   Carotid artery disease ([place])    Doppler, 05/2012, 0-39% bilateral   Cataract    Chronic airway obstruction, not elsewhere classified    Chronic kidney disease, stage II (mild)    Congestive heart failure, unspecified    EF now 60%, previous nonischemic cardiomyopathy   COVID-19    Diabetes mellitus    Dilated bile duct 10/19/2011   Diverticulitis Dec 2012   Gastritis    By EGD   GERD (gastroesophageal reflux disease)    Hypercholesterolemia    Hypertension    Mitral regurgitation 06/27/2013   Neurogenic sleep apnea    Uses noctural oxygen prescribed by her pulmonologi     Pacemaker    Single chamber St. Jude ACCENT [id number] 719 04/[birth year]/1959 10/31/2010   Permanent atrial fibrillation (HCC)    H/o tachybradycardia syndrome on Coumadin anticoagulation, has pacemaker.   Recurrent UTI    Rheumatic heart disease    Tachycardia-bradycardia ([place])    s/p St. Jude single chamber PPM 10/2010    Unspecified hypertensive kidney disease with chronic kidney disease stage I through stage IV, or unspecified(403.90)    Valvular heart disease    a. H/o moderate mitral stenosis by cath 2011 but no mention of this on 10/2011 echo. b. 10/2011 echo: mod MR, mild AS, mild TR; EF>65%   Vitamin D deficiency     Past Surgical History:  Procedure Laterality Date   CHOLECYSTECTOMY     40+ years ago   COLONOSCOPY  10/2011   Dyann Ruddle sigmoid to descending diverticulosis, internal hemorrhoids   ESOPHAGOGASTRODUODENOSCOPY  10/2011   Fields-erosive gastritis, biopsy with no H. pylori, hiatal hernia, peptic duodenitis, no celiac disease, duodenal diverticulum, duodenal nodule   EUS  11/16/11   Mishra-13 MM CBD, normal pancreatic duct, otherwise normal EUS   Evacuation of retroperitoneal and left rectus sheath     HEMATOMA EVACUATION     femoral   HERNIA REPAIR     X3   PILONIDAL CYST / SINUS EXCISION     Single-chamber pacemaker implantation  3/12   by Dr Caryl Comes    Family History  Problem Relation Age of Onset   Cancer Mother  cervical   Stroke Mother    Cancer Daughter 88       kidney, lung   Heart disease Father    Heart attack Father    Ulcers Father    Early death Sister 1       died at 6 months old   Hypertension Brother    Cancer Paternal Aunt        breast   Hypertension Son    Colon cancer Neg Hx     Social History   Socioeconomic History   Marital status: Single    Spouse name: Not on file   Number of children: 2   Years of education: 8   Highest education level: 8th grade  Occupational History   Occupation:  RETIRED    Employer: RETIRED    Comment: [employer]  Tobacco Use   Smoking status: Former Smoker    Packs/day: 0.25    Years: 45.00    Pack years: 11.25    Types: Cigarettes    Start date: 02/05/1958    Quit date: 04/30/2019    Years since quitting: 0.2   Smokeless tobacco: Never Used   Tobacco comment: smokes 5-6 cigarettes daily  Substance and Sexual Activity   Alcohol use: No    Alcohol/week: 0.0 standard drinks   Drug use: No   Sexual activity: Not Currently  Other Topics Concern   Not on file  Social History Narrative   Not on file   Social Determinants of Health   Financial Resource Strain:    Difficulty of Paying Living Expenses: Not on file  Food Insecurity:    Worried About Charity fundraiser in the Last Year: Not on file   [employer] of Food in the Last Year: Not on file  Transportation Needs:    Lack of Transportation (Medical): Not on file   Lack of Transportation (Non-Medical): Not on file  Physical Activity:    Days of Exercise per Week: Not on file   Minutes of Exercise per Session: Not on file  Stress:    Feeling of Stress : Not on file  Social Connections:    Frequency of Communication with Friends and Family: Not on file   Frequency of Social Gatherings with Friends and Family: Not on file   Attends Religious Services: Not on file   Active Member of Clubs or Organizations: Not on file   Attends Archivist Meetings: Not on file   Marital Status: Not on file  Intimate Partner Violence:    Fear of Current or Ex-Partner: Not on file   Emotionally Abused: Not on file   Physically Abused: Not on file   Sexually Abused: Not on file     Physical Exam  Vital Signs and Nursing Notes reviewed Vitals:   08/11/19 1801 08/11/19 1806  BP:    Pulse: 83 74  Resp: (!) 21 15  Temp:    SpO2: 100% 100%    CONSTITUTIONAL: Chronically ill-appearing, NAD NEURO:  Alert and oriented x 3, generalized weakness, no focal  neurological deficits EYES:  eyes equal and reactive ENT/NECK:  no LAD, no JVD CARDIO: Regular rate, well-perfused, normal S1 and S2 PULM:  CTAB no wheezing or rhonchi GI/GU:  normal bowel sounds, non-distended, non-tender MSK/SPINE:  No gross deformities, no edema SKIN:  no rash, atraumatic PSYCH:  Appropriate speech and behavior  Diagnostic and Interventional Summary    EKG Interpretation  Date/Time:  Friday August 11 2019 14:59:34 EST Ventricular Rate:  99 PR Interval:    QRS Duration: 115 QT Interval:  350 QTC Calculation: 450 R Axis:   -78 Text Interpretation: Atrial fibrillation Nonspecific IVCD with LAD Inferior infarct, old Probable anterior infarct, old Lateral leads are also involved No significant change was found Confirmed by Gerlene Fee [phone]) on 08/11/2019 3:02:46 PM      Labs Reviewed  CBC - Abnormal; Notable for the following components:      Result Value   WBC 13.1 (*)    RBC 3.41 (*)    Hemoglobin 11.3 (*)    MCV 108.2 (*)    Platelets 116 (*)    All other components within normal limits  COMPREHENSIVE METABOLIC PANEL - Abnormal; Notable for the following components:   Potassium 3.0 (*)    Glucose, Bld 169 (*)    BUN 43 (*)    Calcium 8.8 (*)    Total Protein 5.9 (*)    Albumin 3.0 (*)    AST 73 (*)    ALT 183 (*)    Alkaline Phosphatase 134 (*)    Total Bilirubin 1.4 (*)    GFR calc non Af Amer 56 (*)    All other components within normal limits  URINALYSIS, ROUTINE W REFLEX MICROSCOPIC - Abnormal; Notable for the following components:   Hgb urine dipstick SMALL (*)    Protein, ur 100 (*)    Leukocytes,Ua TRACE (*)    Bacteria, UA RARE (*)    All other components within normal limits  BRAIN NATRIURETIC PEPTIDE - Abnormal; Notable for the following components:   B Natriuretic Peptide 584.0 (*)    All other components within normal limits  SARS CORONAVIRUS 2 (TAT 6-24 HRS)  POC SARS CORONAVIRUS 2 AG -  ED    US Abdomen Limited RUQ  Final  Result    XR Chest Single View  Final Result      Medications  HYDROcodone-acetaminophen (NORCO/VICODIN) 5-325 MG per tablet 1 tablet (1 tablet Oral Given 08/11/19 1745)     Procedures  /  Critical Care Procedures  ED Course and Medical Decision Making  I have reviewed the triage vital signs and the nursing notes.  Pertinent labs & imaging results that were available during my care of the patient were reviewed by me and considered in my medical decision making (see below for details).     Reportedly an extended hospitalization at [facility], over 2 weeks, continues to be weak upon discharge.  Most likely deconditioning.  Reassuring vital signs, largely without complaints.  Still need to discuss patient's symptoms with her daughter, 1 attempt thus far at contacting her with no answer.  Will obtain basic laboratory testing to exclude other possibilities such as anemia, metabolic disarray, recurrence of pneumonia, UTI.  Work-up reveals elevated LFTs and T bili, which was followed up with right upper quadrant ultrasound, which is revealing worsening dilation of the common bile duct, raising concern for choledocholithiasis.  Radiology recommending ERCP or MRCP.  Patient is also hypokalemic, admitted to hospitalist service for further care.  Barth Kirks. Sedonia Small, [place] [email or web address]  Final Clinical Impressions(s) / ED Diagnoses     ICD-10-CM   1. Common bile duct dilatation  K83.8   2. Weakness  R53.1 XR Chest Single View    XR Chest Single View  3. LFT elevation  R79.89 US Abdomen Limited RUQ    US Abdomen Limited RUQ    ED Discharge Orders  None       Discharge Instructions Discussed with and Provided to Patient:   Discharge Instructions   None       Maudie Flakes, MD 08/11/19 [phone]

## 2019-08-11 NOTE — ED Notes (Signed)
Pt sleeping   She has eaten about 75 per cent of her meal

## 2019-08-12 LAB — COMPREHENSIVE METABOLIC PANEL
ALT: 140 U/L — ABNORMAL HIGH (ref 0–44)
AST: 68 U/L — ABNORMAL HIGH (ref 15–41)
Albumin: 2.7 g/dL — ABNORMAL LOW (ref 3.5–5.0)
Alkaline Phosphatase: 123 U/L (ref 38–126)
Anion gap: 11 (ref 5–15)
BUN: 35 mg/dL — ABNORMAL HIGH (ref 8–23)
CO2: 30 mmol/L (ref 22–32)
Calcium: 8.7 mg/dL — ABNORMAL LOW (ref 8.9–10.3)
Chloride: 106 mmol/L (ref 98–111)
Creatinine, Ser: 0.71 mg/dL (ref 0.44–1.00)
GFR calc Af Amer: >60 mL/min (ref >60)
GFR calc non Af Amer: >60 mL/min (ref >60)
Glucose, Bld: 89 mg/dL (ref 70–99)
Potassium: 3.1 mmol/L — ABNORMAL LOW (ref 3.5–5.1)
Sodium: 147 mmol/L — ABNORMAL HIGH (ref 135–145)
Total Bilirubin: 1.9 mg/dL — ABNORMAL HIGH (ref 0.3–1.2)
Total Protein: 5.4 g/dL — ABNORMAL LOW (ref 6.5–8.1)

## 2019-08-12 LAB — PROTIME-INR
INR: 2.2 — ABNORMAL HIGH (ref 0.8–1.2)
Prothrombin Time: 23.9 seconds — ABNORMAL HIGH (ref 11.4–15.2)

## 2019-08-12 LAB — CBC
HCT: 34.5 % — ABNORMAL LOW (ref 36.0–46.0)
Hemoglobin: 10.3 g/dL — ABNORMAL LOW (ref 12.0–15.0)
MCH: 32.7 pg (ref 26.0–34.0)
MCHC: 29.9 g/dL — ABNORMAL LOW (ref 30.0–36.0)
MCV: 109.5 fL — ABNORMAL HIGH (ref 80.0–100.0)
Platelets: 92 10*3/uL — ABNORMAL LOW (ref 150–400)
RBC: 3.15 MIL/uL — ABNORMAL LOW (ref 3.87–5.11)
RDW: 14.9 % (ref 11.5–15.5)
WBC: 10 10*3/uL (ref 4.0–10.5)
nRBC: 0 % (ref 0.0–0.2)

## 2019-08-12 LAB — GLUCOSE, CAPILLARY
Glucose-Capillary: 83 mg/dL (ref 70–99)
Glucose-Capillary: 91 mg/dL (ref 70–99)
Glucose-Capillary: 84 mg/dL (ref 70–99)
Glucose-Capillary: 89 mg/dL (ref 70–99)
Glucose-Capillary: 70 mg/dL (ref 70–99)

## 2019-08-12 MED ORDER — ALBUTEROL SULFATE (2.5 MG/3ML) 0.083% IN NEBU: 2.5000 mg | INHALATION_SOLUTION | Freq: Four times a day (QID) | RESPIRATORY_TRACT | Status: DC | PRN

## 2019-08-12 MED ORDER — MORPHINE SULFATE (PF) 2 MG/ML IV SOLN
1.0000 mg | INTRAVENOUS | Status: DC | PRN
  Administered 2019-08-12 – 2019-08-16 (×7): 1 mg via INTRAVENOUS
  Filled 2019-08-12 (×7): qty 1

## 2019-08-12 NOTE — Progress Notes (Signed)
Subjective: Patient continues to have right upper quadrant abdominal pain without nausea or vomiting or fever.  She has a dilated biliary system.  Gastroenterology was consulted yesterday but did not feel there was an immediate need for transfer to [facility] for any procedure such as ERCP or MRCP. She also has a 5% pneumothorax which is not causing any respiratory distress.   Objective: Vital signs in last 24 hours: Temp:  [98 F (36.7 C)-98.8 F (37.1 C)] 98 F (36.7 C) (01/02 0604) Pulse Rate:  [56-95] 88 (01/02 0604) Resp:  [15-28] 20 (01/02 0111) BP: (96-159)/(54-81) 132/77 (01/02 0604) SpO2:  [91 %-100 %] 98 % (01/02 0704) Weight:  [54.1 kg-55.7 kg] 54.1 kg (01/02 0111) She looks chronically unwell.  Lung fields appear to be clear anteriorly.  There is a prosthetic sound with murmur when I listen to the heart sounds, consistent with prosthetic valve.  Abdomen is soft and nontender, especially in the right upper quadrant.  She is alert and orientated. Intake/Output from previous day: 01/01 0701 - 01/02 0700 In: 300 [IV Piggyback:300] Out: 700 [Urine:700] Intake/Output this shift: No intake/output data recorded.  Recent Labs    08/11/19 1541  HGB 11.3*   Recent Labs    08/11/19 1541  WBC 13.1*  RBC 3.41*  HCT 36.9  PLT 116*   Recent Labs    08/11/19 1541  NA 142  K 3.0*  CL 104  CO2 28  BUN 43*  CREATININE 0.94  GLUCOSE 169*  CALCIUM 8.8*     Assessment/Plan: 1.  Right upper quadrant abdominal pain with elevation in liver enzymes.  Etiology is not clear to me.  I will ask gastroenterology for further evaluation and recommendations and will follow liver enzymes.   2.  5% pneumothorax.  No respiratory compromise.  Possibly repeat chest x-ray in the next day or 2. 3.  General weakness/lethargy.  She did have COVID-19 disease I believe in September 2020 and she may still not have recovered clinically from this. 4.  Hypokalemia yesterday.  Replete  potassium.   Haeli Gerlich C Marchella Hibbard 08/12/2019, 8:12 AM

## 2019-08-12 NOTE — Progress Notes (Signed)
PT Cancellation Note  Patient Details Name: [name] MRN: [id number] DOB: 15-Sep-1934   Cancelled Treatment:    Reason Eval/Treat Not Completed: Pain limiting ability to participate.  Patient declined therapy secondary to c/o severe burning pain in rectum - RN notified.   10:11 AM, 08/12/19 Lonell Grandchild, MPT Physical Therapist with [facility] [phone] office [phone] mobile phone

## 2019-08-13 LAB — COMPREHENSIVE METABOLIC PANEL
ALT: 93 U/L — ABNORMAL HIGH (ref 0–44)
AST: 38 U/L (ref 15–41)
Albumin: 2.5 g/dL — ABNORMAL LOW (ref 3.5–5.0)
Alkaline Phosphatase: 113 U/L (ref 38–126)
Anion gap: 7 (ref 5–15)
BUN: 30 mg/dL — ABNORMAL HIGH (ref 8–23)
CO2: 28 mmol/L (ref 22–32)
Calcium: 8.5 mg/dL — ABNORMAL LOW (ref 8.9–10.3)
Chloride: 108 mmol/L (ref 98–111)
Creatinine, Ser: 0.77 mg/dL (ref 0.44–1.00)
GFR calc Af Amer: >60 mL/min (ref >60)
GFR calc non Af Amer: >60 mL/min (ref >60)
Glucose, Bld: 88 mg/dL (ref 70–99)
Potassium: 3.9 mmol/L (ref 3.5–5.1)
Sodium: 143 mmol/L (ref 135–145)
Total Bilirubin: 1.1 mg/dL (ref 0.3–1.2)
Total Protein: 4.9 g/dL — ABNORMAL LOW (ref 6.5–8.1)

## 2019-08-13 LAB — GLUCOSE, CAPILLARY
Glucose-Capillary: 84 mg/dL (ref 70–99)
Glucose-Capillary: 89 mg/dL (ref 70–99)
Glucose-Capillary: 143 mg/dL — ABNORMAL HIGH (ref 70–99)

## 2019-08-13 LAB — CBC
HCT: 29.1 % — ABNORMAL LOW (ref 36.0–46.0)
Hemoglobin: 8.9 g/dL — ABNORMAL LOW (ref 12.0–15.0)
MCH: 33.3 pg (ref 26.0–34.0)
MCHC: 30.6 g/dL (ref 30.0–36.0)
MCV: 109 fL — ABNORMAL HIGH (ref 80.0–100.0)
Platelets: 81 10*3/uL — ABNORMAL LOW (ref 150–400)
RBC: 2.67 MIL/uL — ABNORMAL LOW (ref 3.87–5.11)
RDW: 14.9 % (ref 11.5–15.5)
WBC: 8.8 10*3/uL (ref 4.0–10.5)
nRBC: 0 % (ref 0.0–0.2)

## 2019-08-13 LAB — PROTIME-INR
INR: 2.1 — ABNORMAL HIGH (ref 0.8–1.2)
Prothrombin Time: 23.8 seconds — ABNORMAL HIGH (ref 11.4–15.2)

## 2019-08-13 NOTE — Progress Notes (Signed)
Subjective: Continues to complain of right upper quadrant abdominal pain although does not appear to be in significant pain at the present time.  No nausea or vomiting.   Objective: Vital signs in last 24 hours: Temp:  [97.6 F (36.4 C)-98.2 F (36.8 C)] 98.1 F (36.7 C) (01/03 0438) Pulse Rate:  [82-90] 88 (01/03 0438) Resp:  [18-19] 19 (01/03 0438) BP: (109-122)/(53-77) 109/53 (01/03 0438) SpO2:  [96 %-100 %] 98 % (01/03 0827) Vital signs are stable.  No new physical findings today.  Remains alert and orientated. Intake/Output from previous day: 01/02 0701 - 01/03 0700 In: -  Out: 1600 [Urine:1600] Intake/Output this shift: No intake/output data recorded.  Recent Labs    08/11/19 1541 08/12/19 0625  HGB 11.3* 10.3*   Recent Labs    08/11/19 1541 08/12/19 0625  WBC 13.1* 10.0  RBC 3.41* 3.15*  HCT 36.9 34.5*  PLT 116* 92*   Recent Labs    08/11/19 1541 08/12/19 0625  NA 142 147*  K 3.0* 3.1*  CL 104 106  CO2 28 30  BUN 43* 35*  CREATININE 0.94 0.71  GLUCOSE 169* 89  CALCIUM 8.8* 8.7*     Assessment/Plan: 1.  Right upper quadrant abdominal pain.  Liver enzymes somewhat improved on yesterday's blood work.  Await gastroenterology recommendations. 2.  Hypokalemia and increasing BUN.  She is on Lasix and clinically she is dehydrated.  I will discontinue Lasix and encourage oral fluids.  We will start clear liquid diet at the present time. 3.  5% pneumothorax.  No respiratory compromise.  Continue to monitor clinically.   [name] C [name] 08/13/2019, 9:09 AM

## 2019-08-13 NOTE — Evaluation (Signed)
Physical Therapy Evaluation Patient Details Name: [name] MRN: [id number] DOB: 10-Mar-1935 Today's Date: 08/13/2019   History of Present Illness  [name] is a 85 y.o. female with a history of coronary artery disease, permanent atrial fibrillation on Coumadin, Saint Jude pacemaker, GERD, hypertension, stage II chronic kidney disease, COPD with chronic respiratory failure, history of Covid pneumonia in September 2020.  Patient history of cholecystectomy in the distant past.  Patient presents with right upper quadrant pain that started 2 to 3 days ago.  Has diminished appetite.  No radiation to her pain.  Pain worse with eating and improved with fasting.  No palliating or provoking factors.  Denies nausea, vomiting, fevers.  Pain not improved or worsened with defecating.    Clinical Impression  Patient limited for functional mobility as stated below secondary to BLE weakness, fatigue and poor standing balance.  Patient limited to taking a few steps at bedside, very unsteady with buckling of knees and tolerated sitting up in chair after therapy - NT aware.  Patient will benefit from continued physical therapy in hospital and recommended venue below to increase strength, balance, endurance for safe ADLs and gait.     Follow Up Recommendations SNF    Equipment Recommendations  None recommended by PT    Recommendations for Other Services       Precautions / Restrictions Precautions Precautions: Fall Precaution Comments: Home O2 dependent Restrictions Weight Bearing Restrictions: No      Mobility  Bed Mobility Overal bed mobility: Needs Assistance Bed Mobility: Supine to Sit     Supine to sit: Min assist     General bed mobility comments: labored movement, increased time  Transfers Overall transfer level: Needs assistance Equipment used: Rolling walker (2 wheeled) Transfers: Sit to/from [facility] Sit to Stand: Min assist;Mod assist Stand pivot  transfers: Mod assist;Min assist       General transfer comment: unsteady on feet with buckling of knees  Ambulation/Gait Ambulation/Gait assistance: Mod assist Gait Distance (Feet): 5 Feet Assistive device: Rolling walker (2 wheeled) Gait Pattern/deviations: Decreased step length - right;Decreased step length - left;Decreased stride length;Trunk flexed Gait velocity: decreased   General Gait Details: limited to 5-6 slow unsteady steps at bedside due to c/o fatigue/weakness  Stairs            Wheelchair Mobility    Modified Rankin (Stroke Patients Only)       Balance Overall balance assessment: Needs assistance Sitting-balance support: Feet supported Sitting balance-Leahy Scale: Fair Sitting balance - Comments: seated at bedside   Standing balance support: During functional activity;Bilateral upper extremity supported Standing balance-Leahy Scale: Poor Standing balance comment: fair/poor using RW                             Pertinent Vitals/Pain Pain Assessment: Faces Faces Pain Scale: Hurts little more Pain Location: right side abdomen/flank Pain Descriptors / Indicators: Sore;Discomfort;Grimacing;Guarding Pain Intervention(s): Limited activity within patient's tolerance;Monitored during session    Home Living Family/patient expects to be discharged to:: Private residence Living Arrangements: Children Available Help at Discharge: Family;Available PRN/intermittently Type of Home: House Home Access: Stairs to enter Entrance Stairs-Rails: Can reach both Entrance Stairs-Number of Steps: 3 Home Layout: One level Home Equipment: Latina Craver - 2 wheels      Prior Function Level of Independence: Needs assistance   Gait / Transfers Assistance Needed: household ambulator with RW; family assists  ADL's / Homemaking Assistance Needed: family assists  Comments: household and short distanced community ambulator with quad-cane     Hand  Dominance   Dominant Hand: Left    Extremity/Trunk Assessment   Upper Extremity Assessment Upper Extremity Assessment: Generalized weakness    Lower Extremity Assessment Lower Extremity Assessment: Generalized weakness    Cervical / Trunk Assessment Cervical / Trunk Assessment: Kyphotic  Communication   Communication: No difficulties  Cognition Arousal/Alertness: Awake/alert Behavior During Therapy: WFL for tasks assessed/performed Overall Cognitive Status: Within Functional Limits for tasks assessed                                        General Comments      Exercises     Assessment/Plan    PT Assessment Patient needs continued PT services  PT Problem List Decreased strength;Decreased activity tolerance;Decreased balance;Decreased mobility;Pain       PT Treatment Interventions Gait training;Stair training;Functional mobility training;Therapeutic activities;Therapeutic exercise;Patient/family education    PT Goals (Current goals can be found in the Care Plan section)  Acute Rehab PT Goals Patient Stated Goal: Return home with family PT Goal Formulation: With patient Time For Goal Achievement: 08/27/19 Potential to Achieve Goals: Good    Frequency Min 3X/week   Barriers to discharge        Co-evaluation               AM-PAC PT "6 Clicks" Mobility  Outcome Measure Help needed turning from your back to your side while in a flat bed without using bedrails?: A Little Help needed moving from lying on your back to sitting on the side of a flat bed without using bedrails?: A Little Help needed moving to and from a bed to a chair (including a wheelchair)?: A Little Help needed standing up from a chair using your arms (e.g., wheelchair or bedside chair)?: A Lot Help needed to walk in hospital room?: A Lot Help needed climbing 3-5 steps with a railing? : A Lot 6 Click Score: 15    End of Session Equipment Utilized During Treatment:  Oxygen Activity Tolerance: Patient tolerated treatment well;Patient limited by fatigue Patient left: in chair;with call bell/phone within reach Nurse Communication: Mobility status PT Visit Diagnosis: Unsteadiness on feet (R26.81);Other abnormalities of gait and mobility (R26.89);Muscle weakness (generalized) (M62.81)    Time: 9201-0071 PT Time Calculation (min) (ACUTE ONLY): 30 min   Charges:   PT Evaluation $PT Eval Moderate Complexity: 1 Mod PT Treatments $Therapeutic Activity: 23-37 mins        11:13 AM, 08/13/19 Lonell Grandchild, MPT Physical Therapist with [facility] [phone] office [phone] mobile phone

## 2019-08-13 NOTE — Plan of Care (Signed)
  Problem: Acute Rehab PT Goals(only PT should resolve) Goal: Pt Will Go Supine/Side To Sit Outcome: Progressing Flowsheets (Taken 08/13/2019 1114) Pt will go Supine/Side to Sit: with min guard assist Goal: Patient Will Transfer Sit To/From Stand Outcome: Progressing Flowsheets (Taken 08/13/2019 1114) Patient will transfer sit to/from stand: with min guard assist Goal: Pt Will Transfer Bed To Chair/Chair To Bed Outcome: Progressing Flowsheets (Taken 08/13/2019 1114) Pt will Transfer Bed to Chair/Chair to Bed: min guard assist Goal: Pt Will Ambulate Outcome: Progressing Flowsheets (Taken 08/13/2019 1114) Pt will Ambulate:  25 feet  with minimal assist  with rolling walker   11:15 AM, 08/13/19 Lonell Grandchild, MPT Physical Therapist with [facility] [phone] office [phone] mobile phone

## 2019-08-14 ENCOUNTER — Encounter (HOSPITAL_COMMUNITY): Payer: Self-pay | Admitting: Family Medicine

## 2019-08-14 DIAGNOSIS — J9611 Chronic respiratory failure with hypoxia: Secondary | ICD-10-CM

## 2019-08-14 DIAGNOSIS — K8309 Other cholangitis: Principal | ICD-10-CM

## 2019-08-14 LAB — GLUCOSE, CAPILLARY
Glucose-Capillary: 112 mg/dL — ABNORMAL HIGH (ref 70–99)
Glucose-Capillary: 92 mg/dL (ref 70–99)
Glucose-Capillary: 131 mg/dL — ABNORMAL HIGH (ref 70–99)

## 2019-08-14 LAB — COMPREHENSIVE METABOLIC PANEL
ALT: 71 U/L — ABNORMAL HIGH (ref 0–44)
AST: 28 U/L (ref 15–41)
Albumin: 2.4 g/dL — ABNORMAL LOW (ref 3.5–5.0)
Alkaline Phosphatase: 100 U/L (ref 38–126)
Anion gap: 6 (ref 5–15)
BUN: 26 mg/dL — ABNORMAL HIGH (ref 8–23)
CO2: 28 mmol/L (ref 22–32)
Calcium: 8.5 mg/dL — ABNORMAL LOW (ref 8.9–10.3)
Chloride: 108 mmol/L (ref 98–111)
Creatinine, Ser: 0.81 mg/dL (ref 0.44–1.00)
GFR calc Af Amer: >60 mL/min (ref >60)
GFR calc non Af Amer: >60 mL/min (ref >60)
Glucose, Bld: 111 mg/dL — ABNORMAL HIGH (ref 70–99)
Potassium: 4.4 mmol/L (ref 3.5–5.1)
Sodium: 142 mmol/L (ref 135–145)
Total Bilirubin: 0.5 mg/dL (ref 0.3–1.2)
Total Protein: 4.8 g/dL — ABNORMAL LOW (ref 6.5–8.1)

## 2019-08-14 MED ORDER — METRONIDAZOLE IN NACL 5-0.79 MG/ML-% IV SOLN
500.0000 mg | Freq: Three times a day (TID) | INTRAVENOUS | Status: DC
  Administered 2019-08-14 – 2019-08-16 (×8): 500 mg via INTRAVENOUS
  Filled 2019-08-14 (×6): qty 100

## 2019-08-14 MED ORDER — INSULIN ASPART 100 UNIT/ML ~~LOC~~ SOLN
0.0000 [IU] | Freq: Four times a day (QID) | SUBCUTANEOUS | Status: DC
  Administered 2019-08-14 – 2019-08-15 (×2): 2 [IU] via SUBCUTANEOUS
  Administered 2019-08-15: 3 [IU] via SUBCUTANEOUS
  Administered 2019-08-15 – 2019-08-16 (×2): 2 [IU] via SUBCUTANEOUS

## 2019-08-14 MED ORDER — ALUM & MAG HYDROXIDE-SIMETH 200-200-20 MG/5ML PO SUSP
30.0000 mL | Freq: Once | ORAL | Status: AC
  Administered 2019-08-14: 30 mL via ORAL
  Filled 2019-08-14: qty 30

## 2019-08-14 MED ORDER — PANCRELIPASE (LIP-PROT-AMYL) 36000-114000 UNITS PO CPEP
72000.0000 [IU] | ORAL_CAPSULE | Freq: Three times a day (TID) | ORAL | Status: DC
  Administered 2019-08-14 – 2019-08-16 (×6): 72000 [IU] via ORAL
  Filled 2019-08-14 (×8): qty 2

## 2019-08-14 MED ORDER — PANTOPRAZOLE SODIUM 40 MG PO TBEC
40.0000 mg | DELAYED_RELEASE_TABLET | Freq: Two times a day (BID) | ORAL | Status: DC
  Administered 2019-08-14 – 2019-08-16 (×4): 40 mg via ORAL
  Filled 2019-08-14 (×4): qty 1

## 2019-08-14 MED ORDER — PANCRELIPASE (LIP-PROT-AMYL) 12000-38000 UNITS PO CPEP
36000.0000 [IU] | ORAL_CAPSULE | ORAL | Status: DC
  Administered 2019-08-14: 36000 [IU] via ORAL
  Filled 2019-08-14: qty 3

## 2019-08-14 NOTE — NC FL2 (Signed)
[place] LEVEL OF CARE SCREENING TOOL     IDENTIFICATION  Patient Name: [name] Birthdate: 12-04-1934 Sex: female Admission Date (Current Location): 08/11/2019  [employer] and [place] Number:  [facility] [id number] [place] and Address:         Provider Number: [id number]  Attending Physician Name and Address:  Samuella Cota, MD  Relative Name and Phone Number:  Johnell Comings [phone]    Current Level of Care: Hospital Recommended Level of Care: [place] Prior Approval Number:    Date Approved/Denied:   PASRR Number: [id number]  Discharge Plan: SNF    Current Diagnoses: Patient Active Problem List   Diagnosis Date Noted  . Cholangitis 08/11/2019  . Pneumothorax   . Pressure injury of skin 08/06/2019  . Pneumonia due to COVID-19 virus 05/10/2019  . Elevated troponin   . Pneumonia 05/09/2019  . Leg wound, left, sequela 05/09/2019  . Chronic respiratory failure with hypoxia ([place]) 05/09/2019  . COPD exacerbation ([place]) 04/26/2019  . Hypokalemia 04/26/2019  . Supratherapeutic INR 04/26/2019  . At high risk for falls 02/13/2016  . CKD (chronic kidney disease), stage III ([place]) 07/02/2015  . Hyperlipidemia with target LDL less than 100 12/31/2014  . Osteoporosis, post-menopausal 11/01/2013  . GERD (gastroesophageal reflux disease) 07/13/2013  . Aortic stenosis 06/27/2013  . COPD with acute exacerbation ([place]) 06/27/2013  . Acute on chronic diastolic HF (heart failure) ([place]) 06/27/2013  . Vitamin D deficiency   . Tobacco abuse 05/12/2012  . Abdominal wall hernia 10/19/2011  . Unspecified atrial fibrillation ([place])   . CAD (coronary artery disease)   . Pacemaker-St.Jude   . Chronic anticoagulation 12/03/2010  . Anxiety 12/03/2010  . Mitral stenosis 05/30/2010  . Macrocytic anemia 05/16/2010  . Type 2 diabetes mellitus with kidney complication, without long-term current use of insulin ([place]) 01/19/2009  . Essential  hypertension 01/19/2009  . COPD GOLD O  01/19/2009  . HEART MURMUR, BENIGN 01/19/2009    Orientation RESPIRATION BLADDER Height & Weight     Self, Time, Situation, Place  O2(2lpm [place]) Incontinent Weight: 54.1 kg Height:  5\' 2"  (157.5 cm)  BEHAVIORAL SYMPTOMS/MOOD NEUROLOGICAL BOWEL NUTRITION STATUS  (none) (none) Incontinent Diet(carb modified)  AMBULATORY STATUS COMMUNICATION OF NEEDS Skin   Limited Assist(with walker) Verbally PU Stage and Appropriate Care, Bruising, Other (Comment)(Stage II on sacrum; bruising to arms/legs bilaterally, cellulitis to legs bilaterally, MASD to perineum)   PU Stage 2 Dressing: Daily(foam dressing)                   Personal Care Assistance Level of Assistance  Bathing, Feeding, Dressing Bathing Assistance: Limited assistance Feeding assistance: Independent Dressing Assistance: Limited assistance     Functional Limitations Info  Sight, Speech, Hearing Sight Info: Impaired Hearing Info: Adequate Speech Info: Adequate    SPECIAL CARE FACTORS FREQUENCY  PT (By licensed PT)     PT Frequency: 3x week              Contractures Contractures Info: Not present    Additional Factors Info  Insulin Sliding Scale, Code Status, Allergies Code Status Info: full code Allergies Info: chlorhezidine, macrobid, torsemide, tramadol   Insulin Sliding Scale Info: see DC summary       Current Medications (08/14/2019):  This is the current hospital active medication list Current Facility-Administered Medications  Medication Dose Route Frequency Provider Last Rate Last Admin  . albuterol (PROVENTIL) (2.5 MG/3ML) 0.083% nebulizer solution 2.5 mg  2.5 mg Nebulization Q6H PRN Loma Boston  J, DO      . cefTRIAXone (ROCEPHIN) 2 g in sodium chloride 0.9 % 100 mL IVPB  2 g Intravenous Q24H Truett Mainland, DO 200 mL/hr at 08/13/19 1841 2 g at 08/13/19 1841  . clonazePAM (KLONOPIN) tablet 0.5 mg  0.5 mg Oral TID PRN Truett Mainland, DO   0.5 mg at 08/14/19  0140  . diltiazem (CARDIZEM) tablet 120 mg  120 mg Oral Daily Truett Mainland, DO   120 mg at 08/14/19 9030  . HYDROcodone-acetaminophen (NORCO) 7.5-325 MG per tablet 1 tablet  1 tablet Oral TID PRN Truett Mainland, DO   1 tablet at 08/13/19 2152  . insulin aspart (novoLOG) injection 0-15 Units  0-15 Units Subcutaneous Q6H Gosrani, Nimish C, MD      . linaclotide (LINZESS) capsule 145 mcg  145 mcg Oral QAC breakfast Truett Mainland, DO   145 mcg at 08/14/19 0923  . metroNIDAZOLE (FLAGYL) IVPB 500 mg  500 mg Intravenous Q8H Gosrani, Nimish C, MD 100 mL/hr at 08/14/19 0510 500 mg at 08/14/19 0510  . mometasone-formoterol (DULERA) 200-5 MCG/ACT inhaler 2 puff  2 puff Inhalation BID Truett Mainland, DO   2 puff at 08/14/19 0910  . morphine 2 MG/ML injection 1 mg  1 mg Intravenous Q4H PRN Lynetta Mare, MD   1 mg at 08/13/19 1015  . ondansetron (ZOFRAN) tablet 4 mg  4 mg Oral Q6H PRN Truett Mainland, DO   4 mg at 08/13/19 2153   Or  . ondansetron (ZOFRAN) injection 4 mg  4 mg Intravenous Q6H PRN Truett Mainland, DO      . pantoprazole (PROTONIX) EC tablet 40 mg  40 mg Oral BID Truett Mainland, DO   40 mg at 08/14/19 3007  . potassium chloride SA (KLOR-CON) CR tablet 40 mEq  40 mEq Oral Daily Truett Mainland, DO   40 mEq at 08/14/19 0908  . simvastatin (ZOCOR) tablet 10 mg  10 mg Oral Daily Truett Mainland, DO   10 mg at 08/14/19 6226  . sucralfate (CARAFATE) 1 GM/10ML suspension 1 g  1 g Oral TID WC & HS Truett Mainland, DO   1 g at 08/14/19 3335     Discharge Medications: Please see discharge summary for a list of discharge medications.  Relevant Imaging Results:  Relevant Lab Results:   Additional Information ss# [id number]  Sherald Barge, RN

## 2019-08-14 NOTE — Progress Notes (Signed)
PROGRESS NOTE  [name] ENI:[id number] DOB: 03-27-1935 DOA: 08/11/2019 PCP: [name], FNP  Brief History   85 year old woman presented with right upper quadrant abdominal pain.  Admitted for cholangitis, started on empiric antibiotics.  A & P  Cholangitis with a right upper quadrant pain with elevated LFTs.  --Appreciate GI evaluation.   --Repeat CBC and CMP  --Continue PPI, empiric antibiotics --GI recommended EUS/ERCP --Follow-up further GI recommendations tomorrow  COPD, chronic hypoxic respiratory failure on 2 L --Appears stable.  5% pneumothorax --Slight increase in 1/1.  Output repeat chest x-ray ordered thus far. --Seems to be asymptomatic.  Repeat chest x-ray in a.m.  Urinary retention.  Foley catheter apparently removed 12/18 but failed voiding trial that day and again 12/27 --Keep calling catheter.  Follow-up with urology as an outpatient.  Chronic atrial fibrillation on warfarin and Cardizem --Appears stable.  Diabetes mellitus type 2 with CKD --Stable.  CKD stage IIIb --Appears to be at baseline.  Chart Review . Discharge 12/28, acute on chronic respiratory failure, multifocal pneumonia, COPD exacerbation, CHF exacerbation, minimal nontraumatic pneumothorax  DVT prophylaxis: e Code Status: SCDs Family Communication: Full Disposition Plan: SNF    Murray Hodgkins, MD  [facility] Direct contact: see [email or web address] (further directions at bottom of note if needed) 7PM-7AM contact night coverage as at bottom of note 08/14/2019, 4:30 PM  LOS: 3 days   Interval History/Subjective  Still has right upper quadrant pain.  Poor oral intake.  Objective   Vitals:  Vitals:   08/14/19 0354 08/14/19 0905  BP: (!) 107/57   Pulse: 75   Resp: 16   Temp: 98.1 F (36.7 C)   SpO2: 98% 97%    Exam:  Constitutional.  Appears calm, comfortable. Respiratory.  Clear to auscultation bilaterally.  No wheezes, rales or rhonchi.  Normal respiratory  effort. Cardiovascular.  Regular rate and rhythm.  No murmur, rub or gallop.  No lower extremity edema. Abdomen.  Positive right upper quadrant pain. Psychiatric.  Grossly normal mood and affect.  Speech fluent and appropriate.  I have personally reviewed the following:   Today's Data  . BG stable . BMP unremarkable . AST down to 28, ALT down to 71  Scheduled Meds: . diltiazem  120 mg Oral Daily  . insulin aspart  0-15 Units Subcutaneous Q6H  . linaclotide  145 mcg Oral QAC breakfast  . lipase/protease/amylase  36,000 Units Oral With snacks  . lipase/protease/amylase  72,000 Units Oral TID WC  . mometasone-formoterol  2 puff Inhalation BID  . pantoprazole  40 mg Oral BID AC  . potassium chloride SA  40 mEq Oral Daily  . simvastatin  10 mg Oral Daily  . sucralfate  1 g Oral TID WC & HS   Continuous Infusions: . cefTRIAXone (ROCEPHIN)  IV 2 g (08/13/19 1841)  . metronidazole 500 mg (08/14/19 1315)    Active Problems:   Type 2 diabetes mellitus with kidney complication, without long-term current use of insulin (HCC)   Essential hypertension   COPD GOLD O    Unspecified atrial fibrillation (HCC)   Pacemaker-St.Jude   GERD (gastroesophageal reflux disease)   CKD (chronic kidney disease), stage III (HCC)   Hypokalemia   Chronic respiratory failure with hypoxia ([place])   Cholangitis   LOS: 3 days   How to contact the [facility] Attending or Consulting provider [place] or covering provider during after hours [place], for this patient?  1. Check the care team in [facility] [place] and look for a)  attending/consulting [facility] provider listed and b) the [facility] team listed 2. Log into [email or web address] and use [place]'s universal password to access. If you do not have the password, please contact the hospital operator. 3. Locate the [facility] provider you are looking for under [facility] and page to a number that you can be directly reached. 4. If you still have difficulty reaching the provider, please page the  [facility] (Director on Call) for the Hospitalists listed on amion for assistance.

## 2019-08-14 NOTE — Consult Note (Addendum)
REVIEWED-NO ADDITIONAL RECOMMENDATIONS.   Referring Provider: Dr. Anastasio Champion Primary Care Physician:  [name], [place] Primary Gastroenterologist:  Dr. Oneida Alar in remote past, transferring care to Dr. Britta Mccreedy in 2016 most recently last seen by [facility] late 2019  Date of Admission: 08/11/19 Date of Consultation: 08/14/19  Reason for Consultation:  Elevated liver enzymes with CBD dilation, RUQ abdominal pain   HPI:  [name] is an 85 y.o. year old female with long-standing history of RUQ pain, chronically dilated CBD dating back many years, with previous evaluation including an EUS in 2013, April 2019, and Sept 2019 at [facility]. No underlying mass appreciated and overall unrevealing per notes in Care Everywhere (unable to see actual procedure notes). Unable to complete MRI due to pacemaker. Presented this admission with RUQ pain, dilated CBD at 2.3 cm on [place], increased from CT in Oct 2020, bump in LFTs with AST 73, ALT 183, Alk Phos 134, Tbili 1.4. Transaminases have continued to improve since admission. Notably, she had normal LFTs in Dec 2020. Prior to this, another bump in transaminases Oct 2020, abdominal pain, diagnosed with Covid-19 during hospitalization, with CT on file from that time and CBD measuring 15 mm. Coumadin for afib on hold this admission. Started empirically on antibiotics. Chest xray also noting small left apical pneumothorax, initially present 12/25.   Somewhat difficult historian. States RUQ is chronic. Intermittent. Unknown precipitating factors. 12/15 started hurting again. Right-sided abdominal pain. Not worsened with eating. Doesn't feel pain is changed since coming to the hospital on 1/1. No fever or chills. Appetite poor since COVID. Feels like her food doesn't digest like it is supposed to. Constipation starting while here. Previously with chronic diarrhea. Now with 3 soft stools per day since starting Linzess. Doesn't feel like pancreatic enzymes helped as  outpatient. Wants some sherbert, ice cream, stating this sits well. No N/V. +GERD. Taking Rolaids at home. Protonix BID as outpatient. No dysphagia.    No smoking. No ETOH use.    Last weight when seen by [place] in 2016: 66 kg.  Aug 2019 at [facility] 57.2 kg (126 lbs) April 2020: 49 kg Nov 2020: 56.7 kg Inpatient Jan 2020: 54/55 kg   Past Medical History:  Diagnosis Date  . Abnormal CT of the chest    Mediastinal and hilar adenopathy followed by Dr. Koleen Nimrod in [place]  . Anemia, unspecified   . Body mass index (BMI) of 20.0-20.9 in adult FEB 2013 147 LBS  . CAD (coronary artery disease) 2011   Catheterization August 2011: mild nonobstructive coronary artery disease complicated by large left rectus sheath hematoma status post evacuation Coumadin restarted without recurrence  . Carotid artery disease ([place])    Doppler, 05/2012, 0-39% bilateral  . Cataract   . Chronic airway obstruction, not elsewhere classified   . Chronic kidney disease, stage II (mild)   . Congestive heart failure, unspecified    EF now 60%, previous nonischemic cardiomyopathy  . COVID-19   . Diabetes mellitus   . Dilated bile duct 10/19/2011  . Diverticulitis Dec 2012  . Gastritis    By EGD  . GERD (gastroesophageal reflux disease)   . Hypercholesterolemia   . Hypertension   . Mitral regurgitation 06/27/2013  . Neurogenic sleep apnea    Uses noctural oxygen prescribed by her pulmonologi  . Pacemaker    Single chamber [place]. Jude ACCENT [phone] SN 719 04/11/1957 10/31/2010  . Permanent atrial fibrillation (HCC)    H/o tachybradycardia syndrome on Coumadin anticoagulation, has pacemaker.  [name]  Recurrent UTI   . Rheumatic heart disease   . Tachycardia-bradycardia [facility])    s/p St. Jude single chamber PPM 10/2010   . Unspecified hypertensive kidney disease with chronic kidney disease stage I through stage IV, or unspecified(403.90)   . Valvular heart disease    a. H/o moderate mitral stenosis by cath 2011 but no  mention of this on 10/2011 echo. b. 10/2011 echo: mod MR, mild AS, mild TR; EF>65%  . Vitamin D deficiency     Past Surgical History:  Procedure Laterality Date  . CHOLECYSTECTOMY     40+ years ago  . COLONOSCOPY  10/2011   Dyann Ruddle sigmoid to descending diverticulosis, internal hemorrhoids  . ESOPHAGOGASTRODUODENOSCOPY  10/2011   Darlinda Bellows-erosive gastritis, biopsy with no H. pylori, hiatal hernia, peptic duodenitis, no celiac disease, duodenal diverticulum, duodenal nodule  . EUS  11/16/11   Mishra-13 MM CBD, normal pancreatic duct, otherwise normal EUS  . EUS  April and Sept 2019    dilated CBD of 13 mm, normal pancreatic duct, no pancreatic mass. Unable to see repeat EUS results from Sept 2019 at [facility], but telephone notes report benign-appearing CBD dilatation.  . Evacuation of retroperitoneal and left rectus sheath    . HEMATOMA EVACUATION     femoral  . HERNIA REPAIR     X3  . PILONIDAL CYST / SINUS EXCISION    . Single-chamber pacemaker implantation  3/12   by Dr Caryl Comes    Prior to Admission medications   Medication Sig Start Date End Date Taking? Authorizing Provider  budesonide-formoterol (SYMBICORT) 160-4.5 MCG/ACT inhaler Inhale 2 puffs into the lungs 2 (two) times daily. 01/13/19  Yes Martin, Mary-Margaret, FNP  clonazePAM (KLONOPIN) 0.5 MG tablet TAKE 1 TABLET 3 TIMES DAILY AS NEEDED FOR ANXIETY Patient taking differently: Take 0.5 mg by mouth 3 (three) times daily as needed for anxiety.  05/09/19  Yes Martin, Mary-Margaret, FNP  CREON 36000 units CPEP capsule TAKE 3 CAPSULES WITH MEALS  AND TAKE 1 CAPSULE WITH A SNACK AS DIRECTED Patient taking differently: See admin instructions. Take 3 capsules with meals and take 1 capsule with a snack as directed 06/02/19  Yes Martin, Mary-Margaret, FNP  diltiazem (CARDIZEM) 120 MG tablet Take 1 tablet (120 mg total) by mouth daily. 05/09/19  Yes Hassell Done, Mary-Margaret, FNP  ferrous sulfate 325 (65 FE) MG tablet Take 1 tablet (325 mg  total) by mouth daily with breakfast. 10/29/15  Yes Eckard, Tammy, PharmD  furosemide (LASIX) 40 MG tablet Take 1 tablet (40 mg total) by mouth daily. 06/19/19  Yes Hassell Done, Mary-Margaret, FNP  HYDROcodone-acetaminophen (NORCO) 7.5-325 MG tablet Take 1 tablet by mouth 3 (three) times daily as needed (pain).  02/17/16  Yes [provider]  ipratropium-albuterol (DUONEB) 0.5-2.5 (3) MG/3ML SOLN INHALE THE CONTENTS OF 1 VIAL VIA NEBULIZER EVERY 4 HOURS AS NEEDED Patient taking differently: Take 3 mLs by nebulization every 4 (four) hours as needed (for shortness of breath/wheezing).  04/27/19  Yes Kathie Dike, MD  linaclotide Rolan Lipa) 145 MCG CAPS capsule Take 1 capsule (145 mcg total) by mouth daily before breakfast. 08/07/19  Yes Barton Dubois, MD  Multiple Vitamins-Minerals (PRESERVISION AREDS 2) CAPS Take 1 capsule by mouth daily.    Yes [provider]  nitroGLYCERIN (NITROSTAT) 0.4 MG SL tablet DISSOLVE 1 TABLET UNDER THE TONGUE EVERY 5 MINUTES AS NEEDED FOR CHEST PAIN AS DIRECTED Patient taking differently: Place 0.4 mg under the tongue every 5 (five) minutes as needed for chest pain.  04/28/19  Yes Herminio Commons, MD  OXYGEN Inhale 2 L into the lungs at bedtime. 2lpm with sleep only    Yes [provider]  pantoprazole (PROTONIX) 40 MG tablet Take 1 tablet (40 mg total) by mouth 2 (two) times daily. 06/23/19  Yes Martin, Mary-Margaret, FNP  potassium chloride SA (KLOR-CON) 20 MEQ tablet Take 20 mEq by mouth daily.  06/18/19  Yes [provider]  predniSONE (DELTASONE) 20 MG tablet Take 3 tablets by mouth daily x1 day; then 2 tablets by mouth daily x3 days; then 1 tablet by mouth daily x3 days; then half tablet by mouth daily x3 days and stop prednisone. 08/07/19  Yes Barton Dubois, MD  simvastatin (ZOCOR) 10 MG tablet Take 1 tablet (10 mg total) by mouth daily. 05/09/19  Yes Martin, Mary-Margaret, FNP  sitaGLIPtin (JANUVIA) 50 MG tablet TAKE 1 TABLET (50 MG  TOTAL) BY MOUTH DAILY. Patient taking differently: Take 50 mg by mouth daily.  05/09/19  Yes Martin, Mary-Margaret, FNP  sucralfate (CARAFATE) 1 GM/10ML suspension Take 10 mLs (1 g total) by mouth 4 (four) times daily -  with meals and at bedtime. 05/22/19  Yes Hawks, Christy A, FNP  VENTOLIN HFA 108 (90 Base) MCG/ACT inhaler USE 2 PUFFS EVERY 6 HOURS AS NEEDED Patient taking differently: Inhale 2 puffs into the lungs every 6 (six) hours as needed for wheezing or shortness of breath.  01/18/17  Yes Hassell Done, Mary-Margaret, FNP  warfarin (COUMADIN) 2 MG tablet Take 0.5 tablets (1 mg total) by mouth every evening. TAKE 1/2  TABLET (1 MG TOTAL) DAILY AT 6 PM.Get INR check by PCP the day after you are discharged 05/18/19  Yes Thurnell Lose, MD    Current Facility-Administered Medications  Medication Dose Route Frequency Provider Last Rate Last Admin  . albuterol (PROVENTIL) (2.5 MG/3ML) 0.083% nebulizer solution 2.5 mg  2.5 mg Nebulization Q6H PRN Truett Mainland, DO      . cefTRIAXone (ROCEPHIN) 2 g in sodium chloride 0.9 % 100 mL IVPB  2 g Intravenous Q24H Truett Mainland, DO 200 mL/hr at 08/13/19 1841 2 g at 08/13/19 1841  . clonazePAM (KLONOPIN) tablet 0.5 mg  0.5 mg Oral TID PRN Truett Mainland, DO   0.5 mg at 08/14/19 0140  . diltiazem (CARDIZEM) tablet 120 mg  120 mg Oral Daily Truett Mainland, DO   120 mg at 08/13/19 1013  . HYDROcodone-acetaminophen (NORCO) 7.5-325 MG per tablet 1 tablet  1 tablet Oral TID PRN Truett Mainland, DO   1 tablet at 08/13/19 2152  . insulin aspart (novoLOG) injection 0-15 Units  0-15 Units Subcutaneous Q6H Gosrani, Nimish C, MD      . linaclotide (LINZESS) capsule 145 mcg  145 mcg Oral QAC breakfast Truett Mainland, DO   145 mcg at 08/13/19 1013  . metroNIDAZOLE (FLAGYL) IVPB 500 mg  500 mg Intravenous Q8H Gosrani, Nimish C, MD 100 mL/hr at 08/14/19 0510 500 mg at 08/14/19 0510  . mometasone-formoterol (DULERA) 200-5 MCG/ACT inhaler 2 puff  2 puff Inhalation  BID Truett Mainland, DO   2 puff at 08/13/19 2152  . morphine 2 MG/ML injection 1 mg  1 mg Intravenous Q4H PRN Lynetta Mare, MD   1 mg at 08/13/19 1015  . ondansetron (ZOFRAN) tablet 4 mg  4 mg Oral Q6H PRN Truett Mainland, DO   4 mg at 08/13/19 2153   Or  . ondansetron (ZOFRAN) injection 4 mg  4 mg Intravenous Q6H  PRN Truett Mainland, DO      . pantoprazole (PROTONIX) EC tablet 40 mg  40 mg Oral BID Truett Mainland, DO   40 mg at 08/13/19 2154  . potassium chloride SA (KLOR-CON) CR tablet 40 mEq  40 mEq Oral Daily Truett Mainland, DO   40 mEq at 08/13/19 1012  . simvastatin (ZOCOR) tablet 10 mg  10 mg Oral Daily Truett Mainland, DO   10 mg at 08/13/19 1013  . sucralfate (CARAFATE) 1 GM/10ML suspension 1 g  1 g Oral TID WC & HS Truett Mainland, DO   1 g at 08/13/19 2152    Allergies as of 08/11/2019 - Review Complete 08/11/2019  Allergen Reaction Noted  . Chlorhexidine  07/29/2019  . Macrobid [nitrofurantoin monohyd macro] Rash 02/16/2014  . Torsemide Nausea And Vomiting 06/03/2012  . Tramadol Nausea Only 12/03/2010    Family History  Problem Relation Age of Onset  . Cancer Mother        cervical  . Stroke Mother   . Cancer Daughter 27       kidney, lung  . Heart disease Father   . Heart attack Father   . Ulcers Father   . Early death Sister 1       died at 42 months old  . Hypertension Brother   . Cancer Paternal Aunt        breast  . Hypertension Son   . Colon cancer Neg Hx     Social History   Socioeconomic History  . Marital status: Single    Spouse name: Not on file  . Number of children: 2  . Years of education: 8  . Highest education level: 8th grade  Occupational History  . Occupation: RETIRED    Employer: RETIRED    Comment: [employer]  Tobacco Use  . Smoking status: Former Smoker    Packs/day: 0.25    Years: 45.00    Pack years: 11.25    Types: Cigarettes    Start date: 02/05/1958    Quit date: 04/30/2019    Years since quitting: 0.2  .  Smokeless tobacco: Never Used  . Tobacco comment: smokes 5-6 cigarettes daily  Substance and Sexual Activity  . Alcohol use: No    Alcohol/week: 0.0 standard drinks  . Drug use: No  . Sexual activity: Not Currently  Other Topics Concern  . Not on file  Social History Narrative  . Not on file   Social Determinants of Health   Financial Resource Strain:   . Difficulty of Paying Living Expenses: Not on file  Food Insecurity:   . Worried About Charity fundraiser in the Last Year: Not on file  . Ran Out of Food in the Last Year: Not on file  Transportation Needs:   . Lack of Transportation (Medical): Not on file  . Lack of Transportation (Non-Medical): Not on file  Physical Activity:   . Days of Exercise per Week: Not on file  . Minutes of Exercise per Session: Not on file  Stress:   . Feeling of Stress : Not on file  Social Connections:   . Frequency of Communication with Friends and Family: Not on file  . Frequency of Social Gatherings with Friends and Family: Not on file  . Attends Religious Services: Not on file  . Active Member of Clubs or Organizations: Not on file  . Attends Archivist Meetings: Not on file  . Marital Status: Not on  file  Intimate Partner Violence:   . Fear of Current or Ex-Partner: Not on file  . Emotionally Abused: Not on file  . Physically Abused: Not on file  . Sexually Abused: Not on file    Review of Systems: Gen: +weight loss  CV: Denies chest pain, heart palpitations, syncope, edema  Resp: Denies shortness of breath with rest, cough, wheezing GI: see HPI GU : Denies urinary burning, urinary frequency, urinary incontinence.  MS: +joint pain  Derm: Denies rash, itching, dry skin Psych: +depression/anxiety  Heme: Denies bruising, bleeding, and enlarged lymph nodes.  Physical Exam: Vital signs in last 24 hours: Wt Readings from Last 3 Encounters:  08/12/19 54.1 kg  08/06/19 55.7 kg  06/27/19 56.7 kg    Temp:  [97.6 F (36.4  C)-98.9 F (37.2 C)] 98.1 F (36.7 C) (01/04 0354) Pulse Rate:  [75-79] 75 (01/04 0354) Resp:  [16-18] 16 (01/04 0354) BP: (107-127)/(57-77) 107/57 (01/04 0354) SpO2:  [96 %-98 %] 98 % (01/04 0354) Last BM Date: 08/13/19 General:   Alert,  Chronically ill-appearing, frail, no distress.  Head:  Normocephalic and atraumatic. Eyes:  Sclera clear, no icterus.   Conjunctiva pink. Mouth:  edentulous Lungs:  Bilateral scattered wheezing posteriorly  Heart:  S1 S2 present with systolic murmur  Abdomen:  +BS, large ventral hernia, very mild RLQ abdominal tenderness  Rectal:  Deferred  Msk:  Symmetrical without gross deformities. Normal posture. Extremities:  Without edema. Neurologic:  Alert and  oriented x4 Psych:  Normal mood and affect.  Intake/Output from previous day: 01/03 0701 - 01/04 0700 In: 800 [P.O.:600; IV Piggyback:200] Out: 546 [Urine:800; Stool:1] Intake/Output this shift: No intake/output data recorded.  Lab Results: Recent Labs    08/11/19 1541 08/12/19 0625 08/13/19 0839  WBC 13.1* 10.0 8.8  HGB 11.3* 10.3* 8.9*  HCT 36.9 34.5* 29.1*  PLT 116* 92* 81*   BMET Recent Labs    08/12/19 0625 08/13/19 0839 08/14/19 0649  NA 147* 143 142  K 3.1* 3.9 4.4  CL 106 108 108  CO2 _0 GLUCOSE 89 88 111*  BUN 35* 30* 26*  CREATININE 0.71 0.77 0.81  CALCIUM 8.7* 8.5* 8.5*   LFT Recent Labs    08/12/19 0625 08/13/19 0839 08/14/19 0649  PROT 5.4* 4.9* 4.8*  ALBUMIN 2.7* 2.5* 2.4*  AST 68* 38 28  ALT 140* 93* 71*  ALKPHOS 123 113 100  BILITOT 1.9* 1.1 0.5   PT/INR Recent Labs    08/12/19 0625 08/13/19 0839  LABPROT 23.9* 23.8*  INR 2.2* 2.1*     Impression: 85 year old female with multiple comorbidities, long-standing history of RUQ pain, dilated CBD previously evaluated in 2013 and again in 2019 at [facility], presenting again with worsening RUQ pain and documented bump in LFTs. [place] RUQ with dilated intrahepatic ducts, CBD dilation to 2.3  cm, increased from measurement of 1.5 cm on non-contrast CT in Oct 2020. Although she notes pain is unchanged from admission, her abdominal exam is without significant tenderness, and she appears to be in no distress and actually requesting changes in diet.   Elevated LFTs: predominantly elevated transaminases, now improved significantly since admission and now with only mildly elevated ALT at 71. Documentation of elevated transaminases also in Oct 2020 with normalization Dec 2020. With increased CBD dilation and significant bump, query microlithiasis. Unable to exclude occult malignancy although with LFTs improving significantly, would be less likely. Prior evaluation of dilated CBD on several occasions was unrevealing, but she  has had progressive dilation documented on imaging and deserves further assessment.  May be best served at tertiary facility [facility]) for EUS/ERCP instead of locally in light of comorbidities. Unable to complete MRI/MRCP due to pacemaker. CT unlikely to be helpful in this scenario but would pursue if bump again in LFTs or worsening abdominal pain. Agree with empiric antibiotics due to concern for evolving cholangitis at time of presentation. Difficult historian overall and does not appear to be in distress at time of visit; unclear if she is now at her baseline of chronic abdominal pain.  Weight loss: trending down since Oct/Nov s/p COVID hospitalization. Multifactorial in setting of acute illness, decreased appetite, poor oral intake.   Thrombocytopenia: new onset and progressive since admission. Query med effect.   Anemia: chronic, macrocytic.    Plan: Repeat HFP in am Repeat CBC in am Will need further evaluation via EUS/ERCP: anticipate at tertiary facility. To discuss with Dr. Oneida Alar Continue PPI BID Will advance to soft diet Continue empiric antibiotics for now Resume weight-based pancreatic enzymes Continue Linzess: decrease if diarrhea  Annitta Needs, PhD,  ANP-BC [facility]     LOS: 3 days    08/14/2019, 8:48 AM

## 2019-08-14 NOTE — Plan of Care (Signed)
Discussed plan of care with patient, pain management and hygiene with some teach back displayed at this time.  Patient agreed to bath later.

## 2019-08-14 NOTE — TOC Progression Note (Signed)
Transition of Care [facility]) - Progression Note    Patient Details  Name: [name] MRN: [id number] Date of Birth: 08-15-1934  Transition of Care [facility]) CM/SW Contact  Roda Shutters Margretta Sidle, RN Phone Number: 08/14/2019, 10:17 AM  [place] 2.4 mi [address] [place], [place] [address] [phone] Overall rating  5 stars Much above average Overall ratingThe overall rating is based on a nursing home's performance on 3 sources: health inspections, staffing, and quality of resident care measures. Compare        2. 2.4 mi [facility] 2.4 mi [place], [place] [address] [phone] Overall rating  2 stars Below average Overall ratingThe overall rating is based on a nursing home's performance on 3 sources: health inspections, staffing, and quality of resident care measures. Compare        3. 8.3 mi [place] 8.3 mi [place], [place] [address] [phone] Overall rating  4 stars Above average Overall ratingThe overall rating is based on a nursing home's performance on 3 sources: health inspections, staffing, and quality of resident care measures. Compare        4. 10.1 mi [place] 10.1 mi [place], [place] [address] [phone] Overall rating  3 stars Average Overall ratingThe overall rating is based on a nursing home's performance on 3 sources: health inspections, staffing, and quality of resident care measures. Compare        Next steps for choosing a nursing home [facility] 5. 11.5 mi [place] 11.5 mi [place], [place] [address] [phone] Overall rating  1 stars Much below average Overall ratingThe overall rating is based on a nursing home's performance on 3 sources: health inspections, staffing, and quality of resident care measures. Compare        6. 17.2  mi [place] 17.2 mi [address] [place], [place] [address] [phone] Overall rating  4 stars Above average Overall ratingThe overall rating is based on a nursing home's performance on 3 sources: health inspections, staffing, and quality of resident care measures. Compare        7. 19.3 mi [facility] [address], [place] [address] [phone] Overall rating  1 stars Much below average Overall ratingThe overall rating is based on a nursing home's performance on 3 sources: health inspections, staffing, and quality of resident care measures. Compare        8. 19.5 mi [facility] [address] mi [address] [place], [place] [address] [phone] Overall rating  3 stars Average Overall ratingThe overall rating is based on a nursing home's performance on 3 sources: health inspections, staffing, and quality of resident care measures. Compare        9. 20.3 mi [facility] 20.3 mi [address] [place], [place] [address] [phone] Overall rating  1 stars Much below average Overall ratingThe overall rating is based on a nursing home's performance on 3 sources: health inspections, staffing, and quality of resident care measures. Compare        10. 20.4 mi [facility] 20.4 mi [address] [place],  [address] [phone] Overall rating  2 stars Below average Overall ratingThe overall rating is based on a nursing home's performance on 3 sources: health  inspections, staffing, and quality of resident care measures. Compare        11. 20.7 mi [facility] 20.7 mi [place], [place] [address] [phone] Overall rating  1 stars Much below average Overall ratingThe overall rating is based on a nursing home's performance on 3 sources: health inspections,  staffing, and quality of resident care measures. Compare        12. 20.8 mi [place] 20.8 mi [place], [place] [address] [phone] Overall rating  1 stars Much below average Overall ratingThe overall rating is based on a nursing home's performance on 3 sources: health inspections, staffing, and quality of resident care measures. Compare        13. 21.4 mi [facility] [address] [place] [address] [phone] Overall rating  3 stars Average Overall ratingThe overall rating is based on a nursing home's performance on 3 sources: health inspections, staffing, and quality of resident care measures. Compare        14. 21.6 [place] 21.6 [place] [address] [phone] Overall rating  3 stars Average Overall ratingThe overall rating is based on a nursing home's performance on 3 sources: health inspections, staffing, and quality of resident care measures. Compare        15. 21.6 mi [facility]     21.6 mi [address] [place] [address] [phone] Overall rating  1 stars Much below average Overall ratingThe overall rating is based on a nursing home's performance on 3 sources: health inspections, staffing, and quality of resident care measures. Compare         <PrevPrevious  1-15 of 30

## 2019-08-14 NOTE — TOC Initial Note (Signed)
Transition of Care [facility]) - Initial/Assessment Note    Patient Details  Name: [name] MRN: [id number] Date of Birth: 1935-05-27  Transition of Care [facility]) CM/SW Contact:    Sherald Barge, RN Phone Number: 08/14/2019, 10:14 AM  Clinical Narrative:      Has been staying with daughter, requires assistance with ADL's. Is agreeable to SNF, referral sent.              Expected Discharge Plan: Skilled Nursing Facility Barriers to Discharge: No SNF bed   Patient Goals and CMS Choice Patient states their goals for this hospitalization and ongoing recovery are:: get inependence back CMS Medicare.gov Compare Post Acute Care list provided to:: Patient Choice offered to / list presented to : Patient  Expected Discharge Plan and Services Expected Discharge Plan: [place]       Living arrangements for the past 2 months: Single Family Home                                      Prior Living Arrangements/Services Living arrangements for the past 2 months: Single Family Home Lives with:: Adult Children Patient language and need for interpreter reviewed:: Yes Do you feel safe going back to the place where you live?: Yes      Need for Family Participation in Patient Care: Yes (Comment) Care giver support system in place?: Yes (comment) Current home services: DME Criminal Activity/Legal Involvement Pertinent to Current Situation/Hospitalization: No - Comment as needed  Activities of Daily Living Home Assistive Devices/Equipment: CBG Meter, Walker (specify type) ADL Screening (condition at time of admission) Patient's cognitive ability adequate to safely complete daily activities?: Yes Is the patient deaf or have difficulty hearing?: No Does the patient have difficulty seeing, even when wearing glasses/contacts?: No Does the patient have difficulty concentrating, remembering, or making decisions?: No Patient able to express need for assistance with  ADLs?: Yes Does the patient have difficulty dressing or bathing?: No Independently performs ADLs?: Yes (appropriate for developmental age) Does the patient have difficulty walking or climbing stairs?: Yes Weakness of Legs: Both Weakness of Arms/Hands: None  Permission Sought/Granted Permission sought to share information with : Chartered certified accountant granted to share information with : Yes, Verbal Permission Granted     Permission granted to share info w AGENCY: Ottie Glazier, [facility]        Emotional Assessment   Attitude/Demeanor/Rapport: Engaged Affect (typically observed): Appropriate Orientation: : Oriented to Self, Oriented to Place, Oriented to  Time, Oriented to Situation Alcohol / Substance Use: Not Applicable Psych Involvement: No (comment)  Admission diagnosis:  Cholangitis [K83.09] Weakness [R53.1] Common bile duct dilatation [K83.8] LFT elevation [R79.89] Patient Active Problem List   Diagnosis Date Noted  . Cholangitis 08/11/2019  . Pneumothorax   . Pressure injury of skin 08/06/2019  . Pneumonia due to COVID-19 virus 05/10/2019  . Elevated troponin   . Pneumonia 05/09/2019  . Leg wound, left, sequela 05/09/2019  . Chronic respiratory failure with hypoxia ([place]) 05/09/2019  . COPD exacerbation ([place]) 04/26/2019  . Hypokalemia 04/26/2019  . Supratherapeutic INR 04/26/2019  . At high risk for falls 02/13/2016  . CKD (chronic kidney disease), stage III ([place]) 07/02/2015  . Hyperlipidemia with target LDL less than 100 12/31/2014  . Osteoporosis, post-menopausal 11/01/2013  . GERD (gastroesophageal reflux disease) 07/13/2013  . Aortic stenosis 06/27/2013  . COPD with acute exacerbation ([place]) 06/27/2013  .  Acute on chronic diastolic HF (heart failure) ([place]) 06/27/2013  . Vitamin D deficiency   . Tobacco abuse 05/12/2012  . Abdominal wall hernia 10/19/2011  . Unspecified atrial fibrillation ([place])   . CAD (coronary artery disease)    . Pacemaker-St.Jude   . Chronic anticoagulation 12/03/2010  . Anxiety 12/03/2010  . Mitral stenosis 05/30/2010  . Macrocytic anemia 05/16/2010  . Type 2 diabetes mellitus with kidney complication, without long-term current use of insulin ([place]) 01/19/2009  . Essential hypertension 01/19/2009  . COPD GOLD O  01/19/2009  . HEART MURMUR, BENIGN 01/19/2009   PCP:  [name], [place]:   [place], [place] [address] Phone: [phone] Fax: [phone]  [place], [place] [address] Phone: [phone] Fax: [place], [place] [address] [place] [address] Phone: [phone] Fax: [phone]     Social Determinants of Health (SDOH) Interventions    Readmission Risk Interventions Readmission Risk Prevention Plan 07/28/2019 05/14/2019 04/27/2019  Transportation Screening Complete Complete -  PCP or Specialist Appt within 3-5 Days - Complete Complete  HRI or Home Care Consult - Complete Complete  Social Work Consult for [place] Planning/Counseling - Complete -  [place] - Not Applicable -  Medication Review Press photographer) Complete Complete -  PCP or Specialist appointment within 3-5 days of discharge Not Complete - -  [place] or Home Care Consult Complete - -  Palliative Care Screening Not Complete - -  [place] Not Complete - -  Some recent data might be hidden

## 2019-08-15 ENCOUNTER — Inpatient Hospital Stay (HOSPITAL_COMMUNITY): Payer: Medicare HMO

## 2019-08-15 DIAGNOSIS — D696 Thrombocytopenia, unspecified: Secondary | ICD-10-CM

## 2019-08-15 DIAGNOSIS — E1121 Type 2 diabetes mellitus with diabetic nephropathy: Secondary | ICD-10-CM

## 2019-08-15 DIAGNOSIS — J939 Pneumothorax, unspecified: Secondary | ICD-10-CM

## 2019-08-15 DIAGNOSIS — N1832 Chronic kidney disease, stage 3b: Secondary | ICD-10-CM

## 2019-08-15 DIAGNOSIS — K838 Other specified diseases of biliary tract: Secondary | ICD-10-CM

## 2019-08-15 DIAGNOSIS — R7989 Other specified abnormal findings of blood chemistry: Secondary | ICD-10-CM

## 2019-08-15 LAB — COMPREHENSIVE METABOLIC PANEL
ALT: 59 U/L — ABNORMAL HIGH (ref 0–44)
AST: 24 U/L (ref 15–41)
Albumin: 2.4 g/dL — ABNORMAL LOW (ref 3.5–5.0)
Alkaline Phosphatase: 101 U/L (ref 38–126)
Anion gap: 4 — ABNORMAL LOW (ref 5–15)
BUN: 23 mg/dL (ref 8–23)
CO2: 27 mmol/L (ref 22–32)
Calcium: 8.4 mg/dL — ABNORMAL LOW (ref 8.9–10.3)
Chloride: 109 mmol/L (ref 98–111)
Creatinine, Ser: 0.89 mg/dL (ref 0.44–1.00)
GFR calc Af Amer: >60 mL/min (ref >60)
GFR calc non Af Amer: >60 mL/min (ref >60)
Glucose, Bld: 121 mg/dL — ABNORMAL HIGH (ref 70–99)
Potassium: 4.9 mmol/L (ref 3.5–5.1)
Sodium: 140 mmol/L (ref 135–145)
Total Bilirubin: 0.5 mg/dL (ref 0.3–1.2)
Total Protein: 4.8 g/dL — ABNORMAL LOW (ref 6.5–8.1)

## 2019-08-15 LAB — CBC WITH DIFFERENTIAL/PLATELET
Abs Immature Granulocytes: 0.02 10*3/uL (ref 0.00–0.07)
Basophils Absolute: 0 10*3/uL (ref 0.0–0.1)
Basophils Relative: 0 %
Eosinophils Absolute: 0.3 10*3/uL (ref 0.0–0.5)
Eosinophils Relative: 4 %
HCT: 28.6 % — ABNORMAL LOW (ref 36.0–46.0)
Hemoglobin: 8.6 g/dL — ABNORMAL LOW (ref 12.0–15.0)
Immature Granulocytes: 0 %
Lymphocytes Relative: 17 %
Lymphs Abs: 1.3 10*3/uL (ref 0.7–4.0)
MCH: 33.2 pg (ref 26.0–34.0)
MCHC: 30.1 g/dL (ref 30.0–36.0)
MCV: 110.4 fL — ABNORMAL HIGH (ref 80.0–100.0)
Monocytes Absolute: 0.6 10*3/uL (ref 0.1–1.0)
Monocytes Relative: 7 %
Neutro Abs: 5.7 10*3/uL (ref 1.7–7.7)
Neutrophils Relative %: 72 %
Platelets: 77 10*3/uL — ABNORMAL LOW (ref 150–400)
RBC: 2.59 MIL/uL — ABNORMAL LOW (ref 3.87–5.11)
RDW: 14.9 % (ref 11.5–15.5)
WBC: 7.9 10*3/uL (ref 4.0–10.5)
nRBC: 0 % (ref 0.0–0.2)

## 2019-08-15 LAB — GLUCOSE, CAPILLARY
Glucose-Capillary: 132 mg/dL — ABNORMAL HIGH (ref 70–99)
Glucose-Capillary: 139 mg/dL — ABNORMAL HIGH (ref 70–99)
Glucose-Capillary: 155 mg/dL — ABNORMAL HIGH (ref 70–99)
Glucose-Capillary: 84 mg/dL (ref 70–99)

## 2019-08-15 MED ORDER — MORPHINE SULFATE (PF) 2 MG/ML IV SOLN
2.0000 mg | Freq: Once | INTRAVENOUS | Status: AC
  Administered 2019-08-15: 03:00:00 2 mg via INTRAVENOUS
  Filled 2019-08-15: qty 1

## 2019-08-15 MED ORDER — ALUM & MAG HYDROXIDE-SIMETH 200-200-20 MG/5ML PO SUSP
30.0000 mL | ORAL | Status: DC | PRN
  Administered 2019-08-15: 22:00:00 30 mL via ORAL
  Filled 2019-08-15: qty 30

## 2019-08-15 NOTE — Care Management Important Message (Signed)
Important Message  Patient Details  Name: [name] MRN: [id number] Date of Birth: [birth year]   Medicare Important Message Given:  Yes([place], RN agreed to deliver letter to patient)     Tommy Medal 08/15/2019, 2:52 PM

## 2019-08-15 NOTE — Progress Notes (Addendum)
REVIEWED. I PERSONALLY REVIEWED THE JAN 2021 ULTRASOUND WITH DR. Thornton Papas. DEFINITE CBD LARGER THAN ON CT MAY 2019. UNABLE TO EVALUATE DISTAL CBD ON U/S BUT LIVER PANEL JAN 5 IS NEAR NORMAL. WBC IMPROVED. PT CAN FOLLOW UP WITH [facility] FOR DYSPHAGIA AND DILATED CBD. COMPLETE 10 DAYS ABX.   Subjective: States she didn't sleep well last night. Abdominal pain improved since admission. States she is at her baseline. Reports right hip pain. States she had a small BM yesterday without any bright red blood per rectum or black stools. No blood noted in the urinary catheter bag. Denies nausea or vomiting. Reports history of reflux symptoms but none today. Reports long history of pill dysphagia with no significant change. Denies food or liquid dysphagia. Tolerated breakfast and lunch well.   Objective: Vital signs in last 24 hours: Temp:  [97.8 F (36.6 C)-98.6 F (37 C)] 98.6 F (37 C) (01/05 0512) Pulse Rate:  [73-80] 80 (01/05 0512) Resp:  [16] 16 (01/05 0512) BP: (119-120)/(66-79) 120/79 (01/05 0512) SpO2:  [97 %-100 %] 98 % (01/05 0806) Last BM Date: 08/15/19 General:   Alert and oriented, no acute distress. Chronically ill-appearing and frail. Head:  Normocephalic and atraumatic. Eyes:  No icterus, sclera clear. Conjuctiva pink.  Abdomen:  Bowel sounds present. Large ventral hernia. No tenderness to palpation noted in all 4 quadrants. No rebound or guarding.  Msk:  Symmetrical without gross deformities.  Extremities:  Without edema. Neurologic:  Alert and  oriented x4;  grossly normal neurologically. Skin: Ecchymoses noted on extremities. Psych:  Normal mood and affect.  Intake/Output from previous day: 01/04 0701 - 01/05 0700 In: 651 [P.O.:240; IV Piggyback:411] Out: 400 [Urine:400] Intake/Output this shift: Total I/O In: 120 [P.O.:120] Out: -   Lab Results: Recent Labs    08/13/19 0839 08/15/19 0519  WBC 8.8 7.9  HGB 8.9* 8.6*  HCT 29.1* 28.6*  PLT 81* 77*   BMET Recent Labs     08/13/19 0839 08/14/19 0649 08/15/19 0519  NA 143 142 140  K 3.9 4.4 4.9  CL 108 108 109  CO2 28 28 27   GLUCOSE 88 111* 121*  BUN 30* 26* 23  CREATININE 0.77 0.81 0.89  CALCIUM 8.5* 8.5* 8.4*   LFT Recent Labs    08/13/19 0839 08/14/19 0649 08/15/19 0519  PROT 4.9* 4.8* 4.8*  ALBUMIN 2.5* 2.4* 2.4*  AST 38 28 24  ALT 93* 71* 59*  ALKPHOS 113 100 101  BILITOT 1.1 0.5 0.5   PT/INR Recent Labs    08/13/19 0839  LABPROT 23.8*  INR 2.1*     Studies/Results: DG CHEST PORT 1 VIEW  Result Date: 08/15/2019 CLINICAL DATA:  Shortness of breath.  History of COVID. EXAM: PORTABLE CHEST 1 VIEW COMPARISON:  08/11/2019. FINDINGS: Cardiac pacer noted in stable position. Cardiomegaly. Bilateral interstitial prominence. New onset of large left pleural effusion. Underlying left lung consolidation cannot be excluded. Previously identified left pneumothorax no longer visualized. IMPRESSION: 1. Bilateral interstitial prominence noted. Interstitial edema and/or pneumonitis could present this fashion. New onset of large left pleural effusion. Underlying left lung consolidation cannot be excluded. 2.  Interval resolution of left pneumothorax. 3. Cardiac pacer stable position. Cardiomegaly. A component of CHF cannot be excluded. Electronically Signed   By: Marcello Moores  Register   On: 08/15/2019 11:58    Assessment: 85 year old female with multiple comorbidities, long-standing history of RUQ pain, dilated CBD previously evaluated in 2013 and again in 2019 at [facility], presenting again with worsening RUQ  pain and documented bump in LFTs. [place] RUQ with dilated intrahepatic ducts, CBD dilation to 2.3 cm, increased from measurement of 1.5 cm on non-contrast CT in Oct 2020. Clinically, patient has improved reporting abdominal pain is at baseline and exam without any appreciable tenderness to palpation. Tolerating diet well.   Elevated LFTs: Predominantly elevated transaminases, improved significantly since  admission and now with only mildly elevated ALT at 59.  Documentation of elevated transaminases also in Oct 2020 with normalization Dec 2020. With increased CBD dilation and significant bump, query microlithiasis. Unable to exclude occult malignancy although with LFTs improving significantly, would be less likely. Prior evaluation of dilated CBD on several occasions was unrevealing, but she has had progressive dilation documented on imaging and deserves further assessment.  Would be best served at tertiary facility [facility]) for EUS/ERCP instead of locally in light of comorbidities.  Unable to complete MRI/MRCP due to pacemaker. CT unlikely to be helpful in this scenario but would pursue if bump again in LFTs or worsening abdominal pain. Empiric antibiotics due to concern for evolving cholangitis at time of presentation. Overall, patient is clinically improving, tolerating her diet, and states she is at baseline in regards to her abdominal pain.   Weight loss: Trending down since Oct/Nov s/p COVID hospitalization. Multifactorial in setting of acute illness, decreased appetite, poor oral intake.   Thrombocytopenia: new onset and progressive since admission. Query med effect/acute illness. No history of cirrhosis. Defer to hospitalitis.   Anemia: chronic, macrocytic.  Hemoglobin stable today compared to yesterday. Coumadin has been on hold this admission. No overt GI bleeding noted. No obvious blood in urinary catheter.   Plan: Will need further evaluation via EUS/ERCP outpatient at tertiary care facility, likely [facility] as she has been seen there in the past.  Follow LFTs Follow CBC Continue PPI BID Continue empiric antibiotics for now Continue weight-based pancreatic enzymes Continue Linzess: decrease if diarrhea   LOS: 4 days    08/15/2019, 12:06 PM   Aliene Altes, [facility]

## 2019-08-15 NOTE — Progress Notes (Signed)
PROGRESS NOTE  [name] KZS:[id number] DOB: [birth year] DOA: 08/11/2019 PCP: [name], FNP  Brief History   85 year old woman presented with right upper quadrant abdominal pain.  Admitted for cholangitis, started on empiric antibiotics.  A & P  Cholangitis with a right upper quadrant pain with elevated LFTs. Seen by GI with recs for outpt f/u. --Continue PPI, empiric antibiotics given ongoing pain but tolerating diet --GI recommended EUS/ERCP as oupt at [facility]  Thrombocytopenia --Present since admission so doubt med related and no anticoagulants given --may be at nadir; presume secondary to infection --CBC in AM  COPD, chronic hypoxic respiratory failure on 2 L --Appears stable  5% pneumothorax --asymptomatic --f/u repeat CXR today; unless dramatically bigger, will continue to monitor given asymptomatic.  Urinary retention.  Foley catheter apparently removed 12/18 but failed voiding trial that day and again 12/27 --Keep foley catheter.  Follow-up with urology as an outpatient.  Chronic atrial fibrillation on warfarin and Cardizem --Appears stable.  Diabetes mellitus type 2 with CKD --remains stable.   CKD stage IIIb --Appears to be at baseline.  Chart Review . Discharge 12/28, acute on chronic respiratory failure, multifocal pneumonia, COPD exacerbation, CHF exacerbation, minimal nontraumatic pneumothorax  DVT prophylaxis: SCDs (low plts) Code Status: SCDs Family Communication: Full Disposition Plan: SNF    Murray Hodgkins, MD  [facility] Direct contact: see [email or web address] (further directions at bottom of note if needed) 7PM-7AM contact night coverage as at bottom of note 08/15/2019, 12:01 PM  LOS: 4 days   Interval History/Subjective  No real change, still with RUQ pain; fair appetite, not worse with eating; breathing ok.  Objective   Vitals:  Vitals:   08/15/19 0512 08/15/19 0806  BP: 120/79   Pulse: 80   Resp: 16   Temp: 98.6 F  (37 C)   SpO2: 99% 98%    Exam:  Constitutional. Appears calm and comfortable CV RRR no m/r/g, no LE edema; sig skin wrinkling noted from previous woody edema Resp. CTA bilaterally. On [place] 2 L Abd with RUQ pain with palpation. Otherwise soft, ntnd Skin as above Psych grossly nl mood and affect, speech fluent and appropriate  I have personally reviewed the following:   Today's Data  . CBG stable . CMP stable with nl AST and ALT trending down . Hgb 8.6; stable . Plts slightly lower but may be stabilizing  Scheduled Meds: . diltiazem  120 mg Oral Daily  . insulin aspart  0-15 Units Subcutaneous Q6H  . linaclotide  145 mcg Oral QAC breakfast  . lipase/protease/amylase  36,000 Units Oral With snacks  . lipase/protease/amylase  72,000 Units Oral TID WC  . mometasone-formoterol  2 puff Inhalation BID  . pantoprazole  40 mg Oral BID AC  . potassium chloride SA  40 mEq Oral Daily  . simvastatin  10 mg Oral Daily  . sucralfate  1 g Oral TID WC & HS   Continuous Infusions: . cefTRIAXone (ROCEPHIN)  IV 2 g (08/14/19 1843)  . metronidazole 500 mg (08/15/19 0549)    Principal Problem:   Cholangitis Active Problems:   Type 2 diabetes mellitus with kidney complication, without long-term current use of insulin (HCC)   Essential hypertension   COPD GOLD O    Unspecified atrial fibrillation (HCC)   Pacemaker-St.Jude   GERD (gastroesophageal reflux disease)   CKD (chronic kidney disease), stage III (HCC)   Hypokalemia   Chronic respiratory failure with hypoxia (HCC)   Pneumothorax   Thrombocytopenia ([place])   LOS: 4 days  How to contact the [facility] Attending or Consulting provider [place] or covering provider during after hours [place], for this patient?  1. Check the care team in [facility] and look for a) attending/consulting TRH provider listed and b) the [facility] team listed 2. Log into [email or web address] and use [place]'s universal password to access. If you do not have the password, please  contact the hospital operator. 3. Locate the [facility] provider you are looking for under [facility] and page to a number that you can be directly reached. 4. If you still have difficulty reaching the provider, please page the [facility] (Director on Call) for the Hospitalists listed on amion for assistance.

## 2019-08-15 NOTE — Progress Notes (Signed)
Physical Therapy Note  Patient Details  Name: [name] MRN: [id number] Date of Birth: 1935/05/03 Today's Date: 08/15/2019    Pt refused therapy X 2 attempts (AM and PM).  No reason given other than just not feeling good.  Teena Irani, PTA/CLT [phone]    Roseanne Reno B 08/15/2019, 1:56 PM

## 2019-08-16 ENCOUNTER — Telehealth: Payer: Self-pay | Admitting: Gastroenterology

## 2019-08-16 DIAGNOSIS — Z950 Presence of cardiac pacemaker: Secondary | ICD-10-CM

## 2019-08-16 DIAGNOSIS — E876 Hypokalemia: Secondary | ICD-10-CM

## 2019-08-16 DIAGNOSIS — I10 Essential (primary) hypertension: Secondary | ICD-10-CM

## 2019-08-16 DIAGNOSIS — J449 Chronic obstructive pulmonary disease, unspecified: Secondary | ICD-10-CM

## 2019-08-16 DIAGNOSIS — K219 Gastro-esophageal reflux disease without esophagitis: Secondary | ICD-10-CM

## 2019-08-16 LAB — GLUCOSE, CAPILLARY
Glucose-Capillary: 104 mg/dL — ABNORMAL HIGH (ref 70–99)
Glucose-Capillary: 109 mg/dL — ABNORMAL HIGH (ref 70–99)
Glucose-Capillary: 111 mg/dL — ABNORMAL HIGH (ref 70–99)
Glucose-Capillary: 151 mg/dL — ABNORMAL HIGH (ref 70–99)
Glucose-Capillary: 96 mg/dL (ref 70–99)
Glucose-Capillary: 149 mg/dL — ABNORMAL HIGH (ref 70–99)

## 2019-08-16 LAB — CBC WITH DIFFERENTIAL/PLATELET
Abs Immature Granulocytes: 0.02 10*3/uL (ref 0.00–0.07)
Basophils Absolute: 0 10*3/uL (ref 0.0–0.1)
Basophils Relative: 0 %
Eosinophils Absolute: 0.3 10*3/uL (ref 0.0–0.5)
Eosinophils Relative: 4 %
HCT: 26.9 % — ABNORMAL LOW (ref 36.0–46.0)
Hemoglobin: 8.1 g/dL — ABNORMAL LOW (ref 12.0–15.0)
Immature Granulocytes: 0 %
Lymphocytes Relative: 17 %
Lymphs Abs: 1.2 10*3/uL (ref 0.7–4.0)
MCH: 33.2 pg (ref 26.0–34.0)
MCHC: 30.1 g/dL (ref 30.0–36.0)
MCV: 110.2 fL — ABNORMAL HIGH (ref 80.0–100.0)
Monocytes Absolute: 0.5 10*3/uL (ref 0.1–1.0)
Monocytes Relative: 7 %
Neutro Abs: 5.1 10*3/uL (ref 1.7–7.7)
Neutrophils Relative %: 72 %
Platelets: 78 10*3/uL — ABNORMAL LOW (ref 150–400)
RBC: 2.44 MIL/uL — ABNORMAL LOW (ref 3.87–5.11)
RDW: 14.9 % (ref 11.5–15.5)
WBC: 7.1 10*3/uL (ref 4.0–10.5)
nRBC: 0 % (ref 0.0–0.2)

## 2019-08-16 LAB — RESPIRATORY PANEL BY RT PCR (FLU A&B, COVID)
Influenza A by PCR: NEGATIVE
Influenza B by PCR: NEGATIVE
SARS Coronavirus 2 by RT PCR: NEGATIVE

## 2019-08-16 MED ORDER — CLONAZEPAM 0.5 MG PO TABS: 0.5000 mg | ORAL_TABLET | Freq: Three times a day (TID) | ORAL | 0 refills | Status: AC | PRN

## 2019-08-16 MED ORDER — LIDOCAINE 5 % EX PTCH
1.0000 | MEDICATED_PATCH | CUTANEOUS | Status: DC
  Administered 2019-08-16: 05:00:00 1 via TRANSDERMAL
  Filled 2019-08-16: qty 1

## 2019-08-16 MED ORDER — METRONIDAZOLE 250 MG PO TABS: 250.0000 mg | ORAL_TABLET | Freq: Three times a day (TID) | ORAL | 0 refills | Status: AC

## 2019-08-16 MED ORDER — HYDROCODONE-ACETAMINOPHEN 7.5-325 MG PO TABS: 1.0000 | ORAL_TABLET | Freq: Three times a day (TID) | ORAL | 0 refills | Status: AC | PRN

## 2019-08-16 MED ORDER — POTASSIUM CHLORIDE CRYS ER 20 MEQ PO TBCR: 20.0000 meq | EXTENDED_RELEASE_TABLET | ORAL | Status: AC

## 2019-08-16 MED ORDER — CIPROFLOXACIN HCL 250 MG PO TABS: 250.0000 mg | ORAL_TABLET | Freq: Two times a day (BID) | ORAL | 0 refills | Status: AC

## 2019-08-16 MED ORDER — PANCRELIPASE (LIP-PROT-AMYL) 36000-114000 UNITS PO CPEP: 36000.0000 [IU] | ORAL_CAPSULE | ORAL | Status: AC

## 2019-08-16 MED ORDER — FUROSEMIDE 20 MG PO TABS: 20.0000 mg | ORAL_TABLET | ORAL | Status: AC

## 2019-08-16 NOTE — TOC Progression Note (Signed)
Transition of Care [facility]) - Progression Note    Patient Details  Name: [name] MRN: [id number] Date of Birth: Nov 24, 1934  Transition of Care [facility]) CM/SW Contact  Ayham Word, Chauncey [place], RN Phone Number: 08/16/2019, 11:14 AM  Clinical Narrative:   Patient to discharge to [facility] today, will need another covid test prior to transfer.     Expected Discharge Plan: Skilled Nursing Facility Barriers to Discharge: No SNF bed  Expected Discharge Plan and Services Expected Discharge Plan: [place] arrangements for the past 2 months: Single Family Home Expected Discharge Date: 08/16/19                                     Social Determinants of Health (SDOH) Interventions    Readmission Risk Interventions Readmission Risk Prevention Plan 07/28/2019 05/14/2019 04/27/2019  Transportation Screening Complete Complete -  PCP or Specialist Appt within 3-5 Days - Complete Complete  HRI or Home Care Consult - Complete Complete  Social Work Consult for [place] Planning/Counseling - Complete -  Palliative Care Screening - Not Applicable -  Medication Review Press photographer) Complete Complete -  PCP or Specialist appointment within 3-5 days of discharge Not Complete - -  [place] or Home Care Consult Complete - -  Palliative Care Screening Not Complete - -  [place] Not Complete - -  Some recent data might be hidden

## 2019-08-16 NOTE — Telephone Encounter (Signed)
Called [facility] and was advised no appts available until April. They will send a message over to the clinic letting them know patient needs HFU sooner

## 2019-08-16 NOTE — Discharge Summary (Addendum)
Physician Discharge Summary  [name] FHL:[id number] DOB: 1934/09/26 DOA: 08/11/2019  PCP: [name], FNP GI: [facility]  Admit date: 08/11/2019 Discharge date: 08/16/2019  Admitted From:  SNF Disposition:  SNF   Recommendations for Outpatient Follow-up:  1. Follow up with PCP in 1 weeks 2. FOLLOW UP WITH GI AT [facility] FOR EUS/ERCP 3. PLEASE RECHECK CBC IN 1 WEEK TO FOLLOW UP PLATELET COUNT 4. Follow up with [facility]  in 1 week to have foley removed and voiding trial.  5. Monitor PT/INR at least every other day until stable and then once weekly and adjust warfarin as needed.  6. BLEEDING PRECAUTIONS WHILE ON ANTICOAGULATION THERAPY  Discharge Condition: STABLE   CODE STATUS: FULL    Brief Hospitalization Summary: Please see all hospital notes, images, labs for full details of the hospitalization. ADMISSION HPI: [name] is a 85 y.o. female with a history of coronary artery disease, permanent atrial fibrillation on Coumadin, Saint Jude pacemaker, GERD, hypertension, stage II chronic kidney disease, COPD with chronic respiratory failure, history of Covid pneumonia in September 2020.  Patient history of cholecystectomy in the distant past.  Patient presents with right upper quadrant pain that started 2 to 3 days ago.  Has diminished appetite.  No radiation to her pain.  Pain worse with eating and improved with fasting.  No palliating or provoking factors.  Denies nausea, vomiting, fevers.  Pain not improved or worsened with defecating.  Emergency Department Course: LFTs mildly elevated: AST 73, ALT 183, bilirubin 1.4.  White count 13.1.  Right upper quadrant ultrasound shows a dilated bile duct of 2.3 cm.  Brief History   85 year old woman presented with right upper quadrant abdominal pain.  Admitted for cholangitis, started on empiric antibiotics.  A & P  Cholangitis with a right upper quadrant pain with elevated LFTs. Seen by GI with  recs for outpt f/u. --Continue PPI, empiric antibiotics given ongoing pain but tolerating diet TO COMPLETE FULL 10 DAY COURSE --GI recommended EUS/ERCP as oupt at [facility]. GI MADE REFERRAL.   Thrombocytopenia --Present since admission so doubt med related and no anticoagulants given -STABLE AT 78 WITH NO BLEEDING, RECOMMEND RECHECKING CBC IN 1 WEEK  COPD, chronic hypoxic respiratory failure on 2 L --Appears stable  5% pneumothorax - resolved now --asymptomatic --f/u repeat CXR shows resolution   Urinary retention.  Foley catheter apparently removed 12/18 but failed voiding trial that day and again 12/27 --Keep foley catheter.  Follow-up with urology as an outpatient.  Chronic atrial fibrillation on warfarin and Cardizem --Appears stable.  Diabetes mellitus type 2 with CKD --remains stable.   CKD stage IIIb --Appears to be at baseline.   DVT prophylaxis: warfarin Code Status: SCDs Family Communication: Full Disposition Plan: SNF    Discharge Diagnoses:  Principal Problem:   Cholangitis Active Problems:   Type 2 diabetes mellitus with kidney complication, without long-term current use of insulin (HCC)   Essential hypertension   COPD GOLD O    Unspecified atrial fibrillation (HCC)   Pacemaker-St.Jude   Common bile duct dilatation   GERD (gastroesophageal reflux disease)   CKD (chronic kidney disease), stage III (HCC)   Hypokalemia   Chronic respiratory failure with hypoxia (HCC)   Pneumothorax   Thrombocytopenia (HCC)   LFT elevation   Discharge Instructions:  Allergies as of 08/16/2019      Reactions   Chlorhexidine    Macrobid [nitrofurantoin Monohyd Macro] Rash   Torsemide Nausea And Vomiting   Tramadol  Nausea Only      Medication List    STOP taking these medications   predniSONE 20 MG tablet Commonly known as: Deltasone   sucralfate 1 GM/10ML suspension Commonly known as: Carafate     TAKE these medications    budesonide-formoterol 160-4.5 MCG/ACT inhaler Commonly known as: Symbicort Inhale 2 puffs into the lungs 2 (two) times daily.   ciprofloxacin 250 MG tablet Commonly known as: CIPRO Take 1 tablet (250 mg total) by mouth 2 (two) times daily for 4 days.   clonazePAM 0.5 MG tablet Commonly known as: KLONOPIN Take 1 tablet (0.5 mg total) by mouth 3 (three) times daily as needed for anxiety.   diltiazem 120 MG tablet Commonly known as: CARDIZEM Take 1 tablet (120 mg total) by mouth daily.   ferrous sulfate 325 (65 FE) MG tablet Take 1 tablet (325 mg total) by mouth daily with breakfast.   furosemide 20 MG tablet Commonly known as: LASIX Take 1 tablet (20 mg total) by mouth every other day. Start taking on: August 18, 2019 What changed:   medication strength  how much to take  when to take this   HYDROcodone-acetaminophen 7.5-325 MG tablet Commonly known as: NORCO Take 1 tablet by mouth 3 (three) times daily as needed for severe pain (pain). What changed: reasons to take this   ipratropium-albuterol 0.5-2.5 (3) MG/3ML Soln Commonly known as: DUONEB INHALE THE CONTENTS OF 1 VIAL VIA NEBULIZER EVERY 4 HOURS AS NEEDED What changed:   how much to take  how to take this  when to take this  reasons to take this  additional instructions   linaclotide 145 MCG Caps capsule Commonly known as: Linzess Take 1 capsule (145 mcg total) by mouth daily before breakfast.   lipase/protease/amylase 36000 UNITS Cpep capsule Commonly known as: Creon Take 1 capsule (36,000 Units total) by mouth See admin instructions. Take 3 capsules with meals and take 1 capsule with a snack as directed What changed: See the new instructions.   metroNIDAZOLE 250 MG tablet Commonly known as: Flagyl Take 1 tablet (250 mg total) by mouth 3 (three) times daily for 4 days.   nitroGLYCERIN 0.4 MG SL tablet Commonly known as: NITROSTAT DISSOLVE 1 TABLET UNDER THE TONGUE EVERY 5 MINUTES AS NEEDED FOR  CHEST PAIN AS DIRECTED What changed: See the new instructions.   OXYGEN Inhale 2 L into the lungs at bedtime. 2lpm with sleep only   pantoprazole 40 MG tablet Commonly known as: PROTONIX Take 1 tablet (40 mg total) by mouth 2 (two) times daily.   potassium chloride SA 20 MEQ tablet Commonly known as: KLOR-CON Take 1 tablet (20 mEq total) by mouth every other day. Start taking on: August 18, 2019 What changed: when to take this   PreserVision AREDS 2 Caps Take 1 capsule by mouth daily.   simvastatin 10 MG tablet Commonly known as: ZOCOR Take 1 tablet (10 mg total) by mouth daily.   sitaGLIPtin 50 MG tablet Commonly known as: Januvia TAKE 1 TABLET (50 MG TOTAL) BY MOUTH DAILY. What changed:   how much to take  how to take this  when to take this  additional instructions   Ventolin HFA 108 (90 Base) MCG/ACT inhaler Generic drug: albuterol USE 2 PUFFS EVERY 6 HOURS AS NEEDED What changed: See the new instructions.   warfarin 2 MG tablet Commonly known as: COUMADIN Take as directed. If you are unsure how to take this medication, talk to your nurse or doctor. Original instructions:  Take 0.5 tablets (1 mg total) by mouth every evening. TAKE 1/2  TABLET (1 MG TOTAL) DAILY AT 6 PM.Get INR check by PCP the day after you are discharged       Contact information for follow-up providers    [place]. Schedule an appointment as soon as possible for a visit in 1 week(s).   Specialty: Urology Why: Foley Removal and voiding trial - regarding urinary retention Contact information: [phone]           Contact information for after-discharge care    Destination    [facility] .   Service: Skilled Nursing Contact information: [place] [phone]                 Allergies  Allergen Reactions  . Chlorhexidine   . Macrobid [Nitrofurantoin Monohyd Macro] Rash  . Torsemide Nausea And  Vomiting  . Tramadol Nausea Only   Allergies as of 08/16/2019      Reactions   Chlorhexidine    Macrobid [nitrofurantoin Monohyd Macro] Rash   Torsemide Nausea And Vomiting   Tramadol Nausea Only      Medication List    STOP taking these medications   predniSONE 20 MG tablet Commonly known as: Deltasone   sucralfate 1 GM/10ML suspension Commonly known as: Carafate     TAKE these medications   budesonide-formoterol 160-4.5 MCG/ACT inhaler Commonly known as: Symbicort Inhale 2 puffs into the lungs 2 (two) times daily.   ciprofloxacin 250 MG tablet Commonly known as: CIPRO Take 1 tablet (250 mg total) by mouth 2 (two) times daily for 4 days.   clonazePAM 0.5 MG tablet Commonly known as: KLONOPIN Take 1 tablet (0.5 mg total) by mouth 3 (three) times daily as needed for anxiety.   diltiazem 120 MG tablet Commonly known as: CARDIZEM Take 1 tablet (120 mg total) by mouth daily.   ferrous sulfate 325 (65 FE) MG tablet Take 1 tablet (325 mg total) by mouth daily with breakfast.   furosemide 20 MG tablet Commonly known as: LASIX Take 1 tablet (20 mg total) by mouth every other day. Start taking on: August 18, 2019 What changed:   medication strength  how much to take  when to take this   HYDROcodone-acetaminophen 7.5-325 MG tablet Commonly known as: NORCO Take 1 tablet by mouth 3 (three) times daily as needed for severe pain (pain). What changed: reasons to take this   ipratropium-albuterol 0.5-2.5 (3) MG/3ML Soln Commonly known as: DUONEB INHALE THE CONTENTS OF 1 VIAL VIA NEBULIZER EVERY 4 HOURS AS NEEDED What changed:   how much to take  how to take this  when to take this  reasons to take this  additional instructions   linaclotide 145 MCG Caps capsule Commonly known as: Linzess Take 1 capsule (145 mcg total) by mouth daily before breakfast.   lipase/protease/amylase 36000 UNITS Cpep capsule Commonly known as: Creon Take 1 capsule (36,000 Units  total) by mouth See admin instructions. Take 3 capsules with meals and take 1 capsule with a snack as directed What changed: See the new instructions.   metroNIDAZOLE 250 MG tablet Commonly known as: Flagyl Take 1 tablet (250 mg total) by mouth 3 (three) times daily for 4 days.   nitroGLYCERIN 0.4 MG SL tablet Commonly known as: NITROSTAT DISSOLVE 1 TABLET UNDER THE TONGUE EVERY 5 MINUTES AS NEEDED FOR CHEST PAIN AS DIRECTED What changed: See the new instructions.   OXYGEN Inhale 2  L into the lungs at bedtime. 2lpm with sleep only   pantoprazole 40 MG tablet Commonly known as: PROTONIX Take 1 tablet (40 mg total) by mouth 2 (two) times daily.   potassium chloride SA 20 MEQ tablet Commonly known as: KLOR-CON Take 1 tablet (20 mEq total) by mouth every other day. Start taking on: August 18, 2019 What changed: when to take this   PreserVision AREDS 2 Caps Take 1 capsule by mouth daily.   simvastatin 10 MG tablet Commonly known as: ZOCOR Take 1 tablet (10 mg total) by mouth daily.   sitaGLIPtin 50 MG tablet Commonly known as: Januvia TAKE 1 TABLET (50 MG TOTAL) BY MOUTH DAILY. What changed:   how much to take  how to take this  when to take this  additional instructions   Ventolin HFA 108 (90 Base) MCG/ACT inhaler Generic drug: albuterol USE 2 PUFFS EVERY 6 HOURS AS NEEDED What changed: See the new instructions.   warfarin 2 MG tablet Commonly known as: COUMADIN Take as directed. If you are unsure how to take this medication, talk to your nurse or doctor. Original instructions: Take 0.5 tablets (1 mg total) by mouth every evening. TAKE 1/2  TABLET (1 MG TOTAL) DAILY AT 6 PM.Get INR check by PCP the day after you are discharged       Procedures/Studies: DG Abd 1 View  Result Date: 08/04/2019 CLINICAL DATA:  Abdominal pain. EXAM: ABDOMEN - 1 VIEW COMPARISON:  05/12/2019 FINDINGS: The bowel gas pattern is normal. Moderate stool in the rectum. No visible free air  on the supine radiograph. No acute bone abnormality. Aortic atherosclerosis. Multiple surgical clips and mesh attachment devices present over the abdomen, unchanged. Lucency at the left lung base laterally probably represents the pneumothorax seen on the chest x-ray of this same date. IMPRESSION: 1. Benign-appearing abdomen. 2.  Aortic Atherosclerosis (ICD10-I70.0). 3. Lucency at the left lung base probably represents the pneumothorax seen on the prior chest x-ray of this same date. Electronically Signed   By: Lorriane Shire M.D.   On: 08/04/2019 12:46   DG CHEST PORT 1 VIEW  Result Date: 08/15/2019 CLINICAL DATA:  Shortness of breath.  History of COVID. EXAM: PORTABLE CHEST 1 VIEW COMPARISON:  08/11/2019. FINDINGS: Cardiac pacer noted in stable position. Cardiomegaly. Bilateral interstitial prominence. New onset of large left pleural effusion. Underlying left lung consolidation cannot be excluded. Previously identified left pneumothorax no longer visualized. IMPRESSION: 1. Bilateral interstitial prominence noted. Interstitial edema and/or pneumonitis could present this fashion. New onset of large left pleural effusion. Underlying left lung consolidation cannot be excluded. 2.  Interval resolution of left pneumothorax. 3. Cardiac pacer stable position. Cardiomegaly. A component of CHF cannot be excluded. Electronically Signed   By: Marcello Moores  Register   On: 08/15/2019 11:58   XR Chest Single View  Result Date: 08/11/2019 CLINICAL DATA:  Confusion. COVID-19 test pending. Diabetes. Hypertension. CHF. EXAM: PORTABLE CHEST 1 VIEW COMPARISON:  08/05/2019 FINDINGS: Right humeral head probable enchondroma. High riding bilateral humeral L it has, consistent with chronic rotator cuff insufficiency. Midline trachea. Moderate cardiomegaly. Single lead pacer. Atherosclerosis in the transverse aorta. Right costophrenic angle minimally excluded. No right-sided pleural effusion. No large left pleural effusion. Limited  evaluation for left pleural fluid secondary to AP portable technique and cardiomegaly. 5% left apical pneumothorax, slightly increased. Diffuse interstitial thickening. Skin fold over the right hemithorax. Persistent left base opacity. IMPRESSION: Slight increase in 5% left apical pneumothorax. Improved left base aeration.  Favor residual atelectasis.  Cardiomegaly with increased interstitial prominence. Favored to be technique related. No overt congestive failure. Electronically Signed   By: Abigail Miyamoto M.D.   On: 08/11/2019 15:55   DG CHEST PORT 1 VIEW  Result Date: 08/05/2019 CLINICAL DATA:  Shortness of breath.  Left pneumothorax. EXAM: PORTABLE CHEST 1 VIEW COMPARISON:  08/04/2019 FINDINGS: Left-sided pacemaker unchanged. Patient is rotated to the left. Known small left-sided pneumothorax with slight interval improvement. Moderate stable opacification throughout the left lung. Likely associated component of left pleural fluid. Right lung is clear. Cardiomediastinal silhouette and remainder of the exam is unchanged. IMPRESSION: Slight interval improvement of a small left pneumothorax. Stable moderate opacification throughout the left lung likely with component of basilar pleural fluid/atelectasis. Electronically Signed   By: Marin Olp M.D.   On: 08/05/2019 05:13   DG CHEST PORT 1 VIEW  Result Date: 08/04/2019 CLINICAL DATA:  Abdominal pain. EXAM: PORTABLE CHEST 1 VIEW COMPARISON:  Chest x-ray dated 07/31/2019 FINDINGS: The patient has a new approximately 15% left apical pneumothorax with prominent atelectasis in the left upper lobe. There is slightly improved aeration at the left lung base since the prior study although peripheral density persists which is probably loculated pleural fluid. The right pleural effusion seen on the prior chest x-ray has resolved. No consolidative infiltrates on the right. There is slight hazy accentuation of the interstitial markings on the right. Pulmonary vascularity  is normal. Aortic atherosclerosis. Pacemaker in place. No acute bone abnormality. IMPRESSION: 1. New approximately 15% left apical pneumothorax. 2. Slightly improved aeration at the left lung base since the prior study. 3. Hazy accentuation of the interstitial markings in the right lung. 4.  Aortic Atherosclerosis (ICD10-I70.0). Critical Value/emergent results were called by telephone at the time of interpretation on 08/04/2019 at 12:41 pm to Jackelyn Poling, [place] , who verbally acknowledged these results. Electronically Signed   By: Lorriane Shire M.D.   On: 08/04/2019 12:43   DG CHEST PORT 1 VIEW  Result Date: 07/31/2019 CLINICAL DATA:  Cough. EXAM: PORTABLE CHEST 1 VIEW COMPARISON:  07/25/2019. FINDINGS: Cardiac pacer with lead tip over the right ventricle. Cardiomegaly. Diffuse bilateral pulmonary infiltrates/edema and bilateral pleural effusions. Findings have from prior exam. No pneumothorax. Sclerotic changes right proximal humerus again noted consistent old infarct. Degenerative changes left shoulder and thoracic spine again noted. IMPRESSION: 1. Cardiac pacer with lead tip over the right ventricle in stable position. Cardiomegaly. 2. Diffuse bilateral pulmonary infiltrates/edema bilateral pleural effusions. Findings suggest CHF. Bilateral pneumonia cannot be excluded. Findings have progressed from prior exam. Electronically Signed   By: Marcello Moores  Register   On: 07/31/2019 15:14   DG Chest Port 1 View  Result Date: 07/25/2019 CLINICAL DATA:  Shortness of breath, history of CHF and COPD on home O2 EXAM: PORTABLE CHEST 1 VIEW COMPARISON:  Most recent radiograph 05/10/2019 FINDINGS: Lung volumes are diminished with patchy mixed interstitial and airspace opacity with a basilar predominance. Small right pleural effusion is seen. Cardiac silhouette appears enlarged though is partially obscured by basilar opacity. The aorta is calcified. No acute osseous or soft tissue abnormality. Degenerative changes are present in  the imaged spine and shoulders. Stable sclerotic lesion in the right proximal humerus. IMPRESSION: 1. Low lung volumes with patchy mixed interstitial and airspace opacity with a basilar predominance. Findings are suspicious for multifocal pneumonia or aspiration though mixed interstitial and alveolar edema could have a similar appearance 2. Small right pleural effusion. 3. Stable cardiomegaly. 4.  Aortic Atherosclerosis (ICD10-I70.0). Electronically Signed   By: March Rummage  [facility] M.D.   On: 07/25/2019 16:35   US Abdomen Limited RUQ  Result Date: 08/11/2019 CLINICAL DATA:  Elevated liver function tests. EXAM: ULTRASOUND ABDOMEN LIMITED RIGHT UPPER QUADRANT COMPARISON:  CT 05/13/2019 5 CT 12/11/2018 FINDINGS: Gallbladder: Cholecystectomy Common bile duct: Diameter: Dilated intrahepatic ducts. The common duct is dilated, including at 2.3 cm. Compare 1.5 cm on 05/13/2019 CT. Liver: No focal lesion identified. Within normal limits in parenchymal echogenicity. Portal vein is patent on color Doppler imaging with normal direction of blood flow towards the liver. Other: None. IMPRESSION: Cholecystectomy. Intra and extrahepatic biliary duct dilatation. Although duct dilatation has been present back to a CT of 12/27/2017, it is progressive compared to the most recent CT. In the setting of elevated liver function tests, consider MRCP or ERCP to exclude choledocholithiasis. Electronically Signed   By: Abigail Miyamoto M.D.   On: 08/11/2019 17:14     Subjective: Pt says she is feeling a lot better today. No abdominal pain.  No fever or chills.   Discharge Exam: Vitals:   08/16/19 0505 08/16/19 0752  BP: (!) 119/59   Pulse: 76   Resp: 18   Temp: 98.9 F (37.2 C)   SpO2: 99% 99%   Vitals:   08/15/19 1945 08/15/19 2013 08/16/19 0505 08/16/19 0752  BP:  (!) 93/48 (!) 119/59   Pulse: 76 (!) 57 76   Resp: 18 18 18    Temp:  98 F (36.7 C) 98.9 F (37.2 C)   TempSrc:  Oral Oral   SpO2: 96% 96% 99% 99%  Weight:       Height:       General: Pt is alert, awake, not in acute distress Cardiovascular: RRR, S1/S2 +, no rubs, no gallops Respiratory: CTA bilaterally, no wheezing, no rhonchi Abdominal: Soft, NT, ND, bowel sounds + Extremities: no edema, no cyanosis   The results of significant diagnostics from this hospitalization (including imaging, microbiology, ancillary and laboratory) are listed below for reference.     Microbiology: Recent Results (from the past 240 hour(s))  Respiratory Panel by RT PCR (Flu A&B, Covid) - Nasopharyngeal Swab     Status: None   Collection Time: 08/11/19 11:05 PM   Specimen: Nasopharyngeal Swab  Result Value Ref Range Status   SARS Coronavirus 2 by RT PCR NEGATIVE NEGATIVE Final    Comment: (NOTE) SARS-CoV-2 target nucleic acids are NOT DETECTED. The SARS-CoV-2 RNA is generally detectable in upper respiratoy specimens during the acute phase of infection. The lowest concentration of SARS-CoV-2 viral copies this assay can detect is 131 copies/mL. A negative result does not preclude SARS-Cov-2 infection and should not be used as the sole basis for treatment or other patient management decisions. A negative result may occur with  improper specimen collection/handling, submission of specimen other than nasopharyngeal swab, presence of viral mutation(s) within the areas targeted by this assay, and inadequate number of viral copies (<131 copies/mL). A negative result must be combined with clinical observations, patient history, and epidemiological information. The expected result is Negative. Fact Sheet for Patients:  PinkCheek.be Fact Sheet for Healthcare Providers:  GravelBags.it This test is not yet ap proved or cleared by the [place] FDA and  has been authorized for detection and/or diagnosis of SARS-CoV-2 by FDA under an Emergency Use Authorization (EUA). This EUA will remain  in effect (meaning this  test can be used) for the duration of the COVID-19 declaration under Section 564(b)(1) of the Act, 21 U.S.C. section 360bbb-3(b)(1), unless the authorization is terminated  or revoked sooner.    Influenza A by PCR NEGATIVE NEGATIVE Final   Influenza B by PCR NEGATIVE NEGATIVE Final    Comment: (NOTE) The Xpert Xpress SARS-CoV-2/FLU/RSV assay is intended as an aid in  the diagnosis of influenza from Nasopharyngeal swab specimens and  should not be used as a sole basis for treatment. Nasal washings and  aspirates are unacceptable for Xpert Xpress SARS-CoV-2/FLU/RSV  testing. Fact Sheet for Patients: PinkCheek.be Fact Sheet for Healthcare Providers: GravelBags.it This test is not yet approved or cleared by the [place] FDA and  has been authorized for detection and/or diagnosis of SARS-CoV-2 by  FDA under an Emergency Use Authorization (EUA). This EUA will remain  in effect (meaning this test can be used) for the duration of the  Covid-19 declaration under Section 564(b)(1) of the Act, 21  U.S.C. section 360bbb-3(b)(1), unless the authorization is  terminated or revoked. Performed at [facility], [address] [place], [place] [address]      Labs: BNP (last 3 results) Recent Labs    05/14/19 0408 07/25/19 1604 08/11/19 1541  BNP 325.3* 790.0* 332.9*   Basic Metabolic Panel: Recent Labs  Lab 08/11/19 1541 08/12/19 0625 08/13/19 0839 08/14/19 0649 08/15/19 0519  NA 142 147* 143 142 140  K 3.0* 3.1* 3.9 4.4 4.9  CL 104 106 108 108 109  CO2 28 30 28 28 27   GLUCOSE 169* 89 88 111* 121*  BUN 43* 35* 30* 26* 23  CREATININE 0.94 0.71 0.77 0.81 0.89  CALCIUM 8.8* 8.7* 8.5* 8.5* 8.4*  MG 2.2  --   --   --   --    Liver Function Tests: Recent Labs  Lab 08/11/19 1541 08/12/19 0625 08/13/19 0839 08/14/19 0649 08/15/19 0519  AST 73* 68* 38 28 24  ALT 183* 140* 93* 71* 59*  ALKPHOS 134* 123 113 100 101   BILITOT 1.4* 1.9* 1.1 0.5 0.5  PROT 5.9* 5.4* 4.9* 4.8* 4.8*  ALBUMIN 3.0* 2.7* 2.5* 2.4* 2.4*   No results for input(s): LIPASE, AMYLASE in the last 168 hours. No results for input(s): AMMONIA in the last 168 hours. CBC: Recent Labs  Lab 08/11/19 1541 08/12/19 0625 08/13/19 0839 08/15/19 0519 08/16/19 0501  WBC 13.1* 10.0 8.8 7.9 7.1  NEUTROABS  --   --   --  5.7 5.1  HGB 11.3* 10.3* 8.9* 8.6* 8.1*  HCT 36.9 34.5* 29.1* 28.6* 26.9*  MCV 108.2* 109.5* 109.0* 110.4* 110.2*  PLT 116* 92* 81* 77* 78*   Cardiac Enzymes: No results for input(s): CKTOTAL, CKMB, CKMBINDEX, TROPONINI in the last 168 hours. BNP: Invalid input(s): POCBNP CBG: Recent Labs  Lab 08/15/19 1813 08/15/19 2016 08/16/19 0007 08/16/19 0615 08/16/19 1114  GLUCAP 139* 84 109* 96 149*   D-Dimer No results for input(s): DDIMER in the last 72 hours. Hgb A1c No results for input(s): HGBA1C in the last 72 hours. Lipid Profile No results for input(s): CHOL, HDL, LDLCALC, TRIG, CHOLHDL, LDLDIRECT in the last 72 hours. Thyroid function studies No results for input(s): TSH, T4TOTAL, T3FREE, THYROIDAB in the last 72 hours.  Invalid input(s): FREET3 Anemia work up No results for input(s): VITAMINB12, FOLATE, FERRITIN, TIBC, IRON, RETICCTPCT in the last 72 hours. Urinalysis    Component Value Date/Time   COLORURINE YELLOW 08/11/2019 1513   APPEARANCEUR CLEAR 08/11/2019 1513   APPEARANCEUR Cloudy (A) 08/25/2017 1732   LABSPEC 1.023 08/11/2019 1513   PHURINE 6.0 08/11/2019 1513   GLUCOSEU NEGATIVE 08/11/2019 1513  HGBUR SMALL (A) 08/11/2019 1513   BILIRUBINUR NEGATIVE 08/11/2019 1513   BILIRUBINUR Negative 08/25/2017 1732   KETONESUR NEGATIVE 08/11/2019 1513   PROTEINUR 100 (A) 08/11/2019 1513   UROBILINOGEN negative 02/28/2015 1602   UROBILINOGEN 0.2 08/21/2013 1922   NITRITE NEGATIVE 08/11/2019 1513   LEUKOCYTESUR TRACE (A) 08/11/2019 1513   Sepsis Labs Invalid input(s): PROCALCITONIN,  WBC,   LACTICIDVEN Microbiology Recent Results (from the past 240 hour(s))  Respiratory Panel by RT PCR (Flu A&B, Covid) - Nasopharyngeal Swab     Status: None   Collection Time: 08/11/19 11:05 PM   Specimen: Nasopharyngeal Swab  Result Value Ref Range Status   SARS Coronavirus 2 by RT PCR NEGATIVE NEGATIVE Final    Comment: (NOTE) SARS-CoV-2 target nucleic acids are NOT DETECTED. The SARS-CoV-2 RNA is generally detectable in upper respiratoy specimens during the acute phase of infection. The lowest concentration of SARS-CoV-2 viral copies this assay can detect is 131 copies/mL. A negative result does not preclude SARS-Cov-2 infection and should not be used as the sole basis for treatment or other patient management decisions. A negative result may occur with  improper specimen collection/handling, submission of specimen other than nasopharyngeal swab, presence of viral mutation(s) within the areas targeted by this assay, and inadequate number of viral copies (<131 copies/mL). A negative result must be combined with clinical observations, patient history, and epidemiological information. The expected result is Negative. Fact Sheet for Patients:  PinkCheek.be Fact Sheet for Healthcare Providers:  GravelBags.it This test is not yet ap proved or cleared by the [place] FDA and  has been authorized for detection and/or diagnosis of SARS-CoV-2 by FDA under an Emergency Use Authorization (EUA). This EUA will remain  in effect (meaning this test can be used) for the duration of the COVID-19 declaration under Section 564(b)(1) of the Act, 21 U.S.C. section 360bbb-3(b)(1), unless the authorization is terminated or revoked sooner.    Influenza A by PCR NEGATIVE NEGATIVE Final   Influenza B by PCR NEGATIVE NEGATIVE Final    Comment: (NOTE) The Xpert Xpress SARS-CoV-2/FLU/RSV assay is intended as an aid in  the diagnosis of influenza  from Nasopharyngeal swab specimens and  should not be used as a sole basis for treatment. Nasal washings and  aspirates are unacceptable for Xpert Xpress SARS-CoV-2/FLU/RSV  testing. Fact Sheet for Patients: PinkCheek.be Fact Sheet for Healthcare Providers: GravelBags.it This test is not yet approved or cleared by the [place] FDA and  has been authorized for detection and/or diagnosis of SARS-CoV-2 by  FDA under an Emergency Use Authorization (EUA). This EUA will remain  in effect (meaning this test can be used) for the duration of the  Covid-19 declaration under Section 564(b)(1) of the Act, 21  U.S.C. section 360bbb-3(b)(1), unless the authorization is  terminated or revoked. Performed at [facility], [address] [place], [place] [address]     Time coordinating discharge: 33 minutes   SIGNED:  Irwin Brakeman, MD  [facility] 08/16/2019, 11:43 AM How to contact the [facility] Attending or Consulting provider [place] or covering provider during after hours [place], for this patient?  1. Check the care team in [facility] and look for a) attending/consulting TRH provider listed and b) the [facility] team listed 2. Log into [email or web address] and use [place]'s universal password to access. If you do not have the password, please contact the hospital operator. 3. Locate the [facility] provider you are looking for under [facility] and page to a number  that you can be directly reached. 4. If you still have difficulty reaching the provider, please page the [facility] (Director on Call) for the Hospitalists listed on amion for assistance.

## 2019-08-16 NOTE — Discharge Instructions (Signed)
PLEASE FOLLOW UP WITH [facility] IN 1 WEEK TO HAVE FOLEY REMOVED PLEASE CHECK PT/INR EVERY OTHER DAY UNTIL STABLE AND THEN ONCE WEEKLY.  PLEASE CHECK CBC IN 1 WEEK TO FOLLOW UP PLATELET COUNT.     Bleeding Precautions When on Anticoagulant Therapy, Adult Anticoagulant therapy, also called blood thinner therapy, is medicine that helps to prevent and treat blood clots. The medicine works by stopping blood clots from forming or growing. Blood clots that form in your blood vessels can be dangerous. They can break loose and travel to the heart, lungs, or brain. This increases the risk of a heart attack, stroke, or blocked lung artery (pulmonary embolism). Anticoagulants also increase the risk of bleeding. Try to protect yourself from cuts and other injuries that can cause bleeding. It is important to take anticoagulants exactly as told by your health care provider. Why do I need to be on anticoagulant therapy? You may need this medicine if you are at risk of developing a blood clot. Conditions that increase your risk of a blood clot include:  Being born with heart disease or a heart malformation (congenital heart disease).  Developing heart disease.  Having had surgery, such as valve replacement.  Having had a serious accident or other type of severe injury (trauma).  Having certain types of cancer.  Having certain diseases that can increase blood clotting.  Having a high risk of stroke or heart attack.  Having atrial fibrillation (AF). What are the common anticoagulant medicines? There are several types of anticoagulant medicines. The most common types are:  Medicines that you take by mouth (oral medicines), such as: ? Warfarin. ? Novel oral anticoagulants (NOACs), such as:  Direct thrombin inhibitors (dabigatran).  Factor Xa inhibitors (apixaban, edoxaban, and rivaroxaban).  Injections, such as: ? Unfractionated heparin. ? Low molecular weight heparin. These  anticoagulants work in different ways to prevent blood clots. They also have different risks and side effects. What do I need to remember while on anticoagulant therapy? Taking anticoagulants  Take your medicine at the same time every day. If you forget to take your medicine, take it as soon as you remember. Do not double your dosage of medicine if you miss a whole day. Take your normal dose and call your health care provider.  Do not stop taking your medicine unless your health care provider approves. Stopping the medicine can increase your risk of developing a blood clot. Taking other medicines  Take over-the-counter and prescriptions medicines only as told by your health care provider.  Do not take over-the-counter NSAIDs, including aspirin and ibuprofen, while you are on anticoagulant therapy. These medicines increase your risk of dangerous bleeding.  Get approval from your health care provider before you start taking any new medicines, vitamins, or herbal products. Some of these could interfere with your therapy. General instructions  Keep all follow-up visits as told by your health care provider. This is important.  If you are pregnant or trying to get pregnant, talk with a health care provider about anticoagulants. Some of these medicines are not safe to take during pregnancy.  Tell all health care providers, including your dentist, that you are on anticoagulant therapy. It is especially important to tell providers before you have any surgery, medical procedures, or dental work done. What precautions should I take?   Be very careful when using knives, scissors, or other sharp objects.  Use an electric razor instead of a blade.  Do not use toothpicks.  Use a soft-bristled  toothbrush. Brush your teeth gently.  Always wear shoes outdoors and wear slippers indoors.  Be careful when cutting your fingernails and toenails.  Place bath mats in the bathroom. If possible, install  handrails as well.  Wear gloves while you do yard work.  Wear your seat belt.  Prevent falls by removing loose rugs and extension cords from areas where you walk. Use a cane or walker if you need it.  Avoid constipation by: ? Drinking enough fluid to keep your urine clear or pale yellow. ? Eating foods that are high in fiber, such as fresh fruits and vegetables, whole grains, and beans. ? Limiting foods that are high in fat and processed sugars, such as fried and sweet foods.  Do not play contact sports or participate in other activities that have a high risk for injury. What other precautions are important if on warfarin therapy? If you are taking a type of anticoagulant called warfarin, make sure you:  Work with a diet and nutrition specialist (dietitian) to make an eating plan. Do not make any sudden changes to your diet after you have started your eating plan.  Do not drink alcohol. It can interfere with your medicine and increase your risk of an injury that causes bleeding.  Get regular blood tests as told by your health care provider. What are some questions to ask my health care provider?  Why do I need anticoagulant therapy?  What is the best anticoagulant therapy for my condition?  How long will I need anticoagulant therapy?  What are the side effects of anticoagulant therapy?  When should I take my medicine? What should I do if I forget to take it?  Will I need to have regular blood tests?  Do I need to change my diet? Are there foods or drinks that I should avoid?  What activities are safe for me?  What should I do if I want to get pregnant? Contact a health care provider if:  You miss a dose of medicine: ? And you are not sure what to do. ? For more than one day.  You have: ? Menstrual bleeding that is heavier than normal. ? Bloody or brown urine. ? Easy bruising. ? Black and tarry stool or bright red stool. ? Side effects from your medicine.  You  feel weak or dizzy.  You become pregnant. Get help right away if:  You have bleeding that will not stop within 20 minutes from: ? The nose. ? The gums. ? A cut on the skin.  You have a severe headache or stomachache.  You vomit or cough up blood.  You fall or hit your head. Summary  Anticoagulant therapy, also called blood thinner therapy, is medicine that helps to prevent and treat blood clots.  Anticoagulants work in different ways to prevent blood clots. They also have different risks and side effects.  Talk with your health care provider about any precautions that you should take while on anticoagulant therapy. This information is not intended to replace advice given to you by your health care provider. Make sure you discuss any questions you have with your health care provider. Document Revised: 11/16/2018 Document Reviewed: 10/13/2016 Elsevier Patient Education  [place].

## 2019-08-16 NOTE — Telephone Encounter (Signed)
[facility] clinical pool: please arrange expedited referral to [facility] (pt previously seen) due to dilated CBD, elevated LFTs, inpatient recently for cholangitis. Previously has been seen for ERCP and was lost to follow-up there.

## 2019-08-16 NOTE — TOC Transition Note (Signed)
Transition of Care [facility]) - CM/SW Discharge Note   Patient Details  Name: [name] MRN: [id number] Date of Birth: 12/23/1934  Transition of Care [facility]) CM/SW Contact:  Fujiko Picazo, Chauncey Reading, RN Phone Number: 08/16/2019, 1:58 PM   Clinical Narrative:   Patient discharging today. DC clinicals sent. EMS arranged. Family to sign paperwork at [facility].     Final next level of care: Skilled Nursing Facility Barriers to Discharge: Barriers Resolved   Patient Goals and CMS Choice Patient states their goals for this hospitalization and ongoing recovery are:: get inependence back CMS Medicare.gov Compare Post Acute Care list provided to:: Patient Choice offered to / list presented to : Patient  Discharge Placement              Patient chooses bed at: [facility]     Patient and family notified of of transfer: 08/16/19  Discharge Plan and Services                                     Social Determinants of Health (SDOH) Interventions     Readmission Risk Interventions Readmission Risk Prevention Plan 07/28/2019 05/14/2019 04/27/2019  Transportation Screening Complete Complete -  PCP or Specialist Appt within 3-5 Days - Complete Complete  HRI or Home Care Consult - Complete Complete  Social Work Consult for [place] Planning/Counseling - Complete -  Palliative Care Screening - Not Applicable -  Medication Review Press photographer) Complete Complete -  PCP or Specialist appointment within 3-5 days of discharge Not Complete - -  [place] or Home Care Consult Complete - -  Palliative Care Screening Not Complete - -  [place] Not Complete - -  Some recent data might be hidden

## 2019-08-16 NOTE — Progress Notes (Signed)
Subjective: No pain after eating. Abdominal pain improved from admission and feels near her baseline. No N/V. Choking if not sitting upright while eating. Chronic vague solid food dysphagia.   Objective: Vital signs in last 24 hours: Temp:  [98 F (36.7 C)-98.9 F (37.2 C)] 98.9 F (37.2 C) (01/06 0505) Pulse Rate:  [57-76] 76 (01/06 0505) Resp:  [17-18] 18 (01/06 0505) BP: (93-119)/(48-59) 119/59 (01/06 0505) SpO2:  [96 %-99 %] 99 % (01/06 0752) Last BM Date: 08/15/19 General:   Alert and oriented, pleasant Head:  Normocephalic and atraumatic. Abdomen:  Bowel sounds present, large ventral hernia, no TTP RUQ, mild TTP periumbilically at site of hernia Extremities:  Without edema. Neurologic:  Alert and  oriented x4  Intake/Output from previous day: 01/05 0701 - 01/06 0700 In: 920 [P.O.:720; IV Piggyback:200] Out: 700 [Urine:700] Intake/Output this shift: No intake/output data recorded.  Lab Results: Recent Labs    08/13/19 0839 08/15/19 0519 08/16/19 0501  WBC 8.8 7.9 7.1  HGB 8.9* 8.6* 8.1*  HCT 29.1* 28.6* 26.9*  PLT 81* 77* 78*   BMET Recent Labs    08/13/19 0839 08/14/19 0649 08/15/19 0519  NA 143 142 140  K 3.9 4.4 4.9  CL 108 108 109  CO2 28 28 27   GLUCOSE 88 111* 121*  BUN 30* 26* 23  CREATININE 0.77 0.81 0.89  CALCIUM 8.5* 8.5* 8.4*   LFT Recent Labs    08/13/19 0839 08/14/19 0649 08/15/19 0519  PROT 4.9* 4.8* 4.8*  ALBUMIN 2.5* 2.4* 2.4*  AST 38 28 24  ALT 93* 71* 59*  ALKPHOS 113 100 101  BILITOT 1.1 0.5 0.5   PT/INR Recent Labs    08/13/19 0839  LABPROT 23.8*  INR 2.1*    Studies/Results: DG CHEST PORT 1 VIEW  Result Date: 08/15/2019 CLINICAL DATA:  Shortness of breath.  History of COVID. EXAM: PORTABLE CHEST 1 VIEW COMPARISON:  08/11/2019. FINDINGS: Cardiac pacer noted in stable position. Cardiomegaly. Bilateral interstitial prominence. New onset of large left pleural effusion. Underlying left lung consolidation cannot be  excluded. Previously identified left pneumothorax no longer visualized. IMPRESSION: 1. Bilateral interstitial prominence noted. Interstitial edema and/or pneumonitis could present this fashion. New onset of large left pleural effusion. Underlying left lung consolidation cannot be excluded. 2.  Interval resolution of left pneumothorax. 3. Cardiac pacer stable position. Cardiomegaly. A component of CHF cannot be excluded. Electronically Signed   By: Marcello Moores  Register   On: 08/15/2019 11:58    Assessment: 85 year old female with multiple comorbidities, long-standing history of RUQ pain, dilated CBD previously evaluated without significant findings in 2013 and again in 2019 at [facility], presenting again with worsening RUQ pain and documented bump in LFTs, with concerns for cholangitis. [place] RUQ with dilated intrahepatic ducts, CBD dilation to 2.3 cm, increased from measurement of 1.5 cm on non-contrast CT in Oct 2020. LFTs have continued to improve this admission and now with significant improvement in RUQ pain. Notably, she has a long-standing history of abdominal pain and feels she is at her baseline. Query microlithiasis due to increased caliber of CBD and bump in LFTs on admission. No acute indication for ERCP at this point. Unable to complete MRI due to pacemaker. Will continue with plans for expedited outpatient evaluation at [facility] again for dysphagia and dilated CBD, as she has seen them multiple times previously. As of note, she has also transferred her care from [facility] to [facility] in the past, so she will need to  continue follow-up with GI there or another local practice. Continue antibiotics for a total of 10 days.    Plan: Complete total of 10 days antibiotics Will initiate referral to [facility] as outpatient for EUS/ERCP Continue PPI BID Will follow peripherally    Annitta Needs, PhD, ANP-BC [facility]    LOS: 5 days    08/16/2019, 8:29 AM

## 2019-08-17 ENCOUNTER — Telehealth: Payer: Self-pay | Admitting: *Deleted

## 2019-08-17 NOTE — Telephone Encounter (Signed)
Doreene Nest from [place] (pt is new resident there) requesting info on pt PPM - what is pace set at - how does Dr Allred/team receive transmissions - how long is the life of the device - can fax this info to Doreene Nest at [place] fax # [phone] phone # [phone]

## 2019-08-17 NOTE — Telephone Encounter (Signed)
I spoke with Kayla and let her know the pt base rate is 60 bpm. I told her we would like to monitor the pt every 91 days. I told her we ordered the pt a new monitor and her daughter did confirmed it was received. I told her if the daughter could bring the monitor to [place] so we can get a more recent transmission that would be great. I told her we do have her on schedule to send on 08-25-2019. She states I answered all her questions and thanked me for the call.

## 2019-08-30 ENCOUNTER — Encounter: Payer: Self-pay | Admitting: Urology

## 2019-08-30 ENCOUNTER — Ambulatory Visit (INDEPENDENT_AMBULATORY_CARE_PROVIDER_SITE_OTHER): Payer: Medicare HMO | Admitting: Urology

## 2019-08-30 VITALS — BP 124/73 | HR 102 | Temp 96.8°F | Ht 62.0 in | Wt 127.0 lb

## 2019-08-30 DIAGNOSIS — R339 Retention of urine, unspecified: Secondary | ICD-10-CM

## 2019-08-30 NOTE — Addendum Note (Signed)
Addended by: Cleon Gustin on: 08/30/2019 12:34 PM   Modules accepted: Level of Service

## 2019-08-30 NOTE — Progress Notes (Signed)
Fill and Pull Catheter Removal  Patient is present today for a catheter removal.  Patient was cleaned and prepped in a sterile fashion. 0 ml of sterile water/ saline was instilled into the bladder. Pts cath was blocked and she also had a 14 fr cath in. I attempted to drain water in through the cath but was unable too.  I then attempted to push the cath further into bladder and still no water would go ino cath.  I got Gibson Ramp, RN to come in and attempted and she also wasn't able to get water to flow. She went and talked with Dr. Alyson Ingles and he told us to just take the cath out and let the pt facility put the cath back in if needed. 148ml of water was then drained from the balloon.  A 14FR foley cath was removed from the bladder no complications were noted .  Patient as then given some time to void on their own.   Patient tolerated well.  Performed by: Valentina Lucks LPN  Follow up/ Additional notes: 1 month

## 2019-08-30 NOTE — Progress Notes (Signed)
08/30/2019 12:25 PM   [name] [birth year] [id number]  Referring provider: [name], [address] [place],  [place] [address]  Urinary retention  HPI: Ms Abella is a 85yo here for evaluation of urinary retention. She was admitted in dec 2020 with cholangitis and pneumonia and was found to have urinary retention on 12/18. She failed voiding trial on 12/27 and was discharged on 1/6 with foley. She denies any LUTS prior to hospital admission.    PMH: Past Medical History:  Diagnosis Date  . Abnormal CT of the chest    Mediastinal and hilar adenopathy followed by Dr. Koleen Nimrod in [place]  . Anemia, unspecified   . Body mass index (BMI) of 20.0-20.9 in adult FEB 2013 147 LBS  . CAD (coronary artery disease) 2011   Catheterization August 2011: mild nonobstructive coronary artery disease complicated by large left rectus sheath hematoma status post evacuation Coumadin restarted without recurrence  . Carotid artery disease ([place])    Doppler, 05/2012, 0-39% bilateral  . Cataract   . Chronic airway obstruction, not elsewhere classified   . Chronic kidney disease, stage II (mild)   . Congestive heart failure, unspecified    EF now 60%, previous nonischemic cardiomyopathy  . COVID-19   . Diabetes mellitus   . Dilated bile duct 10/19/2011  . Diverticulitis Dec 2012  . Gastritis    By EGD  . GERD (gastroesophageal reflux disease)   . Hypercholesterolemia   . Hypertension   . Mitral regurgitation 06/27/2013  . Neurogenic sleep apnea    Uses noctural oxygen prescribed by her pulmonologi  . Pacemaker    Single chamber [employer] ACCENT [phone] SN 719 04/11/1957 10/31/2010  . Permanent atrial fibrillation (HCC)    H/o tachybradycardia syndrome on Coumadin anticoagulation, has pacemaker.  . Recurrent UTI   . Rheumatic heart disease   . Tachycardia-bradycardia [facility])    s/p St. Jude single chamber PPM 10/2010   . Unspecified hypertensive kidney disease with  chronic kidney disease stage I through stage IV, or unspecified(403.90)   . Valvular heart disease    a. H/o moderate mitral stenosis by cath 2011 but no mention of this on 10/2011 echo. b. 10/2011 echo: mod MR, mild AS, mild TR; EF>65%  . Vitamin D deficiency     Surgical History: Past Surgical History:  Procedure Laterality Date  . CHOLECYSTECTOMY     40+ years ago  . COLONOSCOPY  10/2011   Dyann Ruddle sigmoid to descending diverticulosis, internal hemorrhoids  . ESOPHAGOGASTRODUODENOSCOPY  10/2011   Fields-erosive gastritis, biopsy with no H. pylori, hiatal hernia, peptic duodenitis, no celiac disease, duodenal diverticulum, duodenal nodule  . EUS  11/16/11   Mishra-13 MM CBD, normal pancreatic duct, otherwise normal EUS  . EUS  April and Sept 2019    dilated CBD of 13 mm, normal pancreatic duct, no pancreatic mass. Unable to see repeat EUS results from Sept 2019 at [facility], but telephone notes report benign-appearing CBD dilatation.  . Evacuation of retroperitoneal and left rectus sheath    . HEMATOMA EVACUATION     femoral  . HERNIA REPAIR     X3  . PILONIDAL CYST / SINUS EXCISION    . Single-chamber pacemaker implantation  3/12   by Dr Caryl Comes    Home Medications:  Allergies as of 08/30/2019      Reactions   Chlorhexidine    Macrobid [nitrofurantoin Monohyd Macro] Rash   Torsemide Nausea And Vomiting   Tramadol Nausea Only  Medication List       Accurate as of August 30, 2019 12:25 PM. If you have any questions, ask your nurse or doctor.        budesonide-formoterol 160-4.5 MCG/ACT inhaler Commonly known as: Symbicort Inhale 2 puffs into the lungs 2 (two) times daily.   clonazePAM 0.5 MG tablet Commonly known as: KLONOPIN Take 1 tablet (0.5 mg total) by mouth 3 (three) times daily as needed for anxiety.   diltiazem 120 MG tablet Commonly known as: CARDIZEM Take 1 tablet (120 mg total) by mouth daily.   ferrous sulfate 325 (65 FE) MG tablet Take 1  tablet (325 mg total) by mouth daily with breakfast.   furosemide 20 MG tablet Commonly known as: LASIX Take 1 tablet (20 mg total) by mouth every other day.   HYDROcodone-acetaminophen 7.5-325 MG tablet Commonly known as: NORCO Take 1 tablet by mouth 3 (three) times daily as needed for severe pain (pain).   ipratropium-albuterol 0.5-2.5 (3) MG/3ML Soln Commonly known as: DUONEB INHALE THE CONTENTS OF 1 VIAL VIA NEBULIZER EVERY 4 HOURS AS NEEDED What changed:   how much to take  how to take this  when to take this  reasons to take this  additional instructions   linaclotide 145 MCG Caps capsule Commonly known as: Linzess Take 1 capsule (145 mcg total) by mouth daily before breakfast.   lipase/protease/amylase 36000 UNITS Cpep capsule Commonly known as: Creon Take 1 capsule (36,000 Units total) by mouth See admin instructions. Take 3 capsules with meals and take 1 capsule with a snack as directed   nitroGLYCERIN 0.4 MG SL tablet Commonly known as: NITROSTAT DISSOLVE 1 TABLET UNDER THE TONGUE EVERY 5 MINUTES AS NEEDED FOR CHEST PAIN AS DIRECTED What changed: See the new instructions.   OXYGEN Inhale 2 L into the lungs at bedtime. 2lpm with sleep only   pantoprazole 40 MG tablet Commonly known as: PROTONIX Take 1 tablet (40 mg total) by mouth 2 (two) times daily.   potassium chloride SA 20 MEQ tablet Commonly known as: KLOR-CON Take 1 tablet (20 mEq total) by mouth every other day.   PreserVision AREDS 2 Caps Take 1 capsule by mouth daily.   simvastatin 10 MG tablet Commonly known as: ZOCOR Take 1 tablet (10 mg total) by mouth daily.   sitaGLIPtin 50 MG tablet Commonly known as: Januvia TAKE 1 TABLET (50 MG TOTAL) BY MOUTH DAILY. What changed:   how much to take  how to take this  when to take this  additional instructions   Ventolin HFA 108 (90 Base) MCG/ACT inhaler Generic drug: albuterol USE 2 PUFFS EVERY 6 HOURS AS NEEDED What changed: See the  new instructions.   warfarin 2 MG tablet Commonly known as: COUMADIN Take as directed by the anticoagulation clinic. If you are unsure how to take this medication, talk to your nurse or doctor. Original instructions: Take 0.5 tablets (1 mg total) by mouth every evening. TAKE 1/2  TABLET (1 MG TOTAL) DAILY AT 6 PM.Get INR check by PCP the day after you are discharged       Allergies:  Allergies  Allergen Reactions  . Chlorhexidine   . Macrobid [Nitrofurantoin Monohyd Macro] Rash  . Torsemide Nausea And Vomiting  . Tramadol Nausea Only    Family History: Family History  Problem Relation Age of Onset  . Cancer Mother        cervical  . Stroke Mother   . Cancer Daughter 55  kidney, lung  . Heart disease Father   . Heart attack Father   . Ulcers Father   . Early death Sister 1       died at 57 months old  . Hypertension Brother   . Cancer Paternal Aunt        breast  . Hypertension Son   . Colon cancer Neg Hx     Social History:  reports that she quit smoking about 4 months ago. Her smoking use included cigarettes. She started smoking about 61 years ago. She has a 11.25 pack-year smoking history. She has never used smokeless tobacco. She reports that she does not drink alcohol or use drugs.  ROS: All other review of systems were reviewed and are negative except what is noted above in HPI  Physical Exam: BP 124/73   Pulse (!) 102   Temp (!) 96.8 F (36 C)   Ht 5\' 2"  (1.575 m)   Wt 127 lb (57.6 kg)   BMI 23.23 kg/m   Constitutional:  Alert and oriented, No acute distress. HEENT: [place] AT, moist mucus membranes.  Trachea midline, no masses. Cardiovascular: No clubbing, cyanosis, or edema. Respiratory: Normal respiratory effort, no increased work of breathing. GI: Abdomen is soft, nontender, nondistended, no abdominal masses GU: No CVA tenderness Lymph: No cervical or inguinal lymphadenopathy. Skin: No rashes, bruises or suspicious lesions. Neurologic: Grossly  intact, no focal deficits, moving all 4 extremities. Psychiatric: Normal mood and affect.  Laboratory Data: Lab Results  Component Value Date   WBC 7.1 08/16/2019   HGB 8.1 (L) 08/16/2019   HCT 26.9 (L) 08/16/2019   MCV 110.2 (H) 08/16/2019   PLT 78 (L) 08/16/2019    Lab Results  Component Value Date   CREATININE 0.89 08/15/2019    No results found for: PSA  No results found for: TESTOSTERONE  Lab Results  Component Value Date   HGBA1C 5.6 07/28/2019    Urinalysis    Component Value Date/Time   COLORURINE YELLOW 08/11/2019 1513   APPEARANCEUR CLEAR 08/11/2019 1513   APPEARANCEUR Cloudy (A) 08/25/2017 1732   LABSPEC 1.023 08/11/2019 1513   PHURINE 6.0 08/11/2019 1513   GLUCOSEU NEGATIVE 08/11/2019 1513   HGBUR SMALL (A) 08/11/2019 1513   BILIRUBINUR NEGATIVE 08/11/2019 1513   BILIRUBINUR Negative 08/25/2017 1732   KETONESUR NEGATIVE 08/11/2019 1513   PROTEINUR 100 (A) 08/11/2019 1513   UROBILINOGEN negative 02/28/2015 1602   UROBILINOGEN 0.2 08/21/2013 1922   NITRITE NEGATIVE 08/11/2019 1513   LEUKOCYTESUR TRACE (A) 08/11/2019 1513    Lab Results  Component Value Date   LABMICR See below: 09/14/2016   WBCUA 11-30 (A) 09/14/2016   RBCUA 0-2 09/14/2016   LABEPIT 0-10 09/14/2016   MUCUS neg 02/28/2015   BACTERIA RARE (A) 08/11/2019    Pertinent Imaging:  Results for orders placed during the hospital encounter of 07/25/19  DG Abd 1 View   Narrative CLINICAL DATA:  Abdominal pain.  EXAM: ABDOMEN - 1 VIEW  COMPARISON:  05/12/2019  FINDINGS: The bowel gas pattern is normal. Moderate stool in the rectum. No visible free air on the supine radiograph. No acute bone abnormality. Aortic atherosclerosis. Multiple surgical clips and mesh attachment devices present over the abdomen, unchanged.  Lucency at the left lung base laterally probably represents the pneumothorax seen on the chest x-ray of this same date.  IMPRESSION: 1. Benign-appearing  abdomen. 2.  Aortic Atherosclerosis (ICD10-I70.0). 3. Lucency at the left lung base probably represents the pneumothorax seen on the prior  chest x-ray of this same date.   Electronically Signed   By: Lorriane Shire M.D.   On: 08/04/2019 12:46    No results found for this or any previous visit. No results found for this or any previous visit. No results found for this or any previous visit. No results found for this or any previous visit. No results found for this or any previous visit. No results found for this or any previous visit. No results found for this or any previous visit.  Assessment & Plan:    1. Urinary retention -foley removed today. If patient is unable to void foley will be reinserted. RTC 1 month with PVR   Return in about 6 months (around 02/27/2020).  Nicolette Bang, MD  [facility] [place]

## 2019-08-30 NOTE — Patient Instructions (Signed)
Acute Urinary Retention, Female  Acute urinary retention means that you cannot pee (urinate) at all, or that you pee too little and your bladder is not emptied completely. If it is not treated, it can lead to kidney damage or other serious problems. Follow these instructions at home:  Take over-the-counter and prescription medicines only as told by your doctor. Ask your doctor what medicines you should stay away from. Do not take any medicine unless your doctor says it is okay to do so.  If you were sent home with a tube that drains pee from the bladder (catheter), take care of it as told by your doctor.  Drink enough fluid to keep your pee clear or pale yellow.  If you were given an antibiotic, take it as told by your doctor. Do not stop taking the antibiotic even if you start to feel better.  Do not use any products that contain nicotine or tobacco, such as cigarettes and e-cigarettes. If you need help quitting, ask your doctor.  Watch for changes in your symptoms. Tell your doctor about them.  If told, keep track of any changes in your blood pressure at home. Tell your doctor about them.  Keep all follow-up visits as told by your doctor. This is important. Contact a doctor if:  You have spasms or you leak pee when you have spasms. Get help right away if:  You have chills or a fever.  You have blood in your pee.  You have a tube that drains the bladder and: ? The tube stops draining pee. ? The tube falls out. Summary  Acute urinary retention means that you cannot pee at all, or that you pee too little and your bladder is not emptied completely. If it is not treated, it can result in kidney damage or other serious problems.  If you were sent home with a tube that drains pee from the bladder, take care of it as told by your doctor.  Pay attention to any changes in your symptoms. Tell your doctor about them. This information is not intended to replace advice given to you by your  health care provider. Make sure you discuss any questions you have with your health care provider. Document Revised: 07/09/2017 Document Reviewed: 08/28/2016 Elsevier Patient Education  2020 [employer]  

## 2019-09-05 ENCOUNTER — Ambulatory Visit (INDEPENDENT_AMBULATORY_CARE_PROVIDER_SITE_OTHER): Payer: Medicare HMO

## 2019-09-05 ENCOUNTER — Other Ambulatory Visit: Payer: Self-pay

## 2019-09-05 DIAGNOSIS — I4821 Permanent atrial fibrillation: Secondary | ICD-10-CM

## 2019-09-05 DIAGNOSIS — I5032 Chronic diastolic (congestive) heart failure: Secondary | ICD-10-CM

## 2019-09-05 DIAGNOSIS — I2510 Atherosclerotic heart disease of native coronary artery without angina pectoris: Secondary | ICD-10-CM | POA: Diagnosis not present

## 2019-09-05 DIAGNOSIS — J441 Chronic obstructive pulmonary disease with (acute) exacerbation: Secondary | ICD-10-CM | POA: Diagnosis not present

## 2019-09-05 DIAGNOSIS — I130 Hypertensive heart and chronic kidney disease with heart failure and stage 1 through stage 4 chronic kidney disease, or unspecified chronic kidney disease: Secondary | ICD-10-CM

## 2019-09-05 DIAGNOSIS — D631 Anemia in chronic kidney disease: Secondary | ICD-10-CM

## 2019-09-05 DIAGNOSIS — I495 Sick sinus syndrome: Secondary | ICD-10-CM

## 2019-09-05 DIAGNOSIS — I051 Rheumatic mitral insufficiency: Secondary | ICD-10-CM

## 2019-09-05 DIAGNOSIS — J9611 Chronic respiratory failure with hypoxia: Secondary | ICD-10-CM

## 2019-09-05 DIAGNOSIS — I0981 Rheumatic heart failure: Secondary | ICD-10-CM

## 2019-09-05 DIAGNOSIS — F419 Anxiety disorder, unspecified: Secondary | ICD-10-CM

## 2019-09-05 DIAGNOSIS — N183 Chronic kidney disease, stage 3 unspecified: Secondary | ICD-10-CM

## 2019-09-05 DIAGNOSIS — E1122 Type 2 diabetes mellitus with diabetic chronic kidney disease: Secondary | ICD-10-CM

## 2019-09-06 ENCOUNTER — Inpatient Hospital Stay (HOSPITAL_COMMUNITY)
Admission: EM | Admit: 2019-09-06 | Discharge: 2019-09-11 | DRG: 871 | Disposition: E | Payer: Medicare HMO | Attending: Internal Medicine | Admitting: Internal Medicine

## 2019-09-06 ENCOUNTER — Emergency Department (HOSPITAL_COMMUNITY): Payer: Medicare HMO

## 2019-09-06 ENCOUNTER — Encounter (HOSPITAL_COMMUNITY): Payer: Self-pay | Admitting: *Deleted

## 2019-09-06 ENCOUNTER — Telehealth: Payer: Self-pay

## 2019-09-06 ENCOUNTER — Other Ambulatory Visit: Payer: Self-pay

## 2019-09-06 DIAGNOSIS — K922 Gastrointestinal hemorrhage, unspecified: Secondary | ICD-10-CM

## 2019-09-06 DIAGNOSIS — K219 Gastro-esophageal reflux disease without esophagitis: Secondary | ICD-10-CM | POA: Diagnosis present

## 2019-09-06 DIAGNOSIS — Z8249 Family history of ischemic heart disease and other diseases of the circulatory system: Secondary | ICD-10-CM

## 2019-09-06 DIAGNOSIS — Z7901 Long term (current) use of anticoagulants: Secondary | ICD-10-CM

## 2019-09-06 DIAGNOSIS — Z7984 Long term (current) use of oral hypoglycemic drugs: Secondary | ICD-10-CM

## 2019-09-06 DIAGNOSIS — Z79891 Long term (current) use of opiate analgesic: Secondary | ICD-10-CM

## 2019-09-06 DIAGNOSIS — R6521 Severe sepsis with septic shock: Secondary | ICD-10-CM | POA: Diagnosis present

## 2019-09-06 DIAGNOSIS — I4821 Permanent atrial fibrillation: Secondary | ICD-10-CM | POA: Diagnosis present

## 2019-09-06 DIAGNOSIS — K658 Other peritonitis: Secondary | ICD-10-CM | POA: Diagnosis present

## 2019-09-06 DIAGNOSIS — K5289 Other specified noninfective gastroenteritis and colitis: Secondary | ICD-10-CM | POA: Diagnosis present

## 2019-09-06 DIAGNOSIS — M810 Age-related osteoporosis without current pathological fracture: Secondary | ICD-10-CM | POA: Diagnosis present

## 2019-09-06 DIAGNOSIS — K625 Hemorrhage of anus and rectum: Secondary | ICD-10-CM | POA: Diagnosis not present

## 2019-09-06 DIAGNOSIS — K668 Other specified disorders of peritoneum: Secondary | ICD-10-CM

## 2019-09-06 DIAGNOSIS — I110 Hypertensive heart disease with heart failure: Secondary | ICD-10-CM | POA: Diagnosis present

## 2019-09-06 DIAGNOSIS — E872 Acidosis, unspecified: Secondary | ICD-10-CM | POA: Diagnosis present

## 2019-09-06 DIAGNOSIS — I5022 Chronic systolic (congestive) heart failure: Secondary | ICD-10-CM | POA: Diagnosis present

## 2019-09-06 DIAGNOSIS — E1129 Type 2 diabetes mellitus with other diabetic kidney complication: Secondary | ICD-10-CM | POA: Diagnosis present

## 2019-09-06 DIAGNOSIS — J449 Chronic obstructive pulmonary disease, unspecified: Secondary | ICD-10-CM | POA: Diagnosis present

## 2019-09-06 DIAGNOSIS — E7800 Pure hypercholesterolemia, unspecified: Secondary | ICD-10-CM | POA: Diagnosis present

## 2019-09-06 DIAGNOSIS — Z515 Encounter for palliative care: Secondary | ICD-10-CM | POA: Diagnosis present

## 2019-09-06 DIAGNOSIS — K6389 Other specified diseases of intestine: Secondary | ICD-10-CM | POA: Diagnosis present

## 2019-09-06 DIAGNOSIS — I4891 Unspecified atrial fibrillation: Secondary | ICD-10-CM | POA: Diagnosis present

## 2019-09-06 DIAGNOSIS — Z9981 Dependence on supplemental oxygen: Secondary | ICD-10-CM

## 2019-09-06 DIAGNOSIS — Z87891 Personal history of nicotine dependence: Secondary | ICD-10-CM

## 2019-09-06 DIAGNOSIS — A419 Sepsis, unspecified organism: Principal | ICD-10-CM | POA: Diagnosis present

## 2019-09-06 DIAGNOSIS — I482 Chronic atrial fibrillation, unspecified: Secondary | ICD-10-CM

## 2019-09-06 DIAGNOSIS — Z87440 Personal history of urinary (tract) infections: Secondary | ICD-10-CM

## 2019-09-06 DIAGNOSIS — Z8619 Personal history of other infectious and parasitic diseases: Secondary | ICD-10-CM

## 2019-09-06 DIAGNOSIS — Z79899 Other long term (current) drug therapy: Secondary | ICD-10-CM

## 2019-09-06 DIAGNOSIS — K631 Perforation of intestine (nontraumatic): Secondary | ICD-10-CM | POA: Diagnosis present

## 2019-09-06 DIAGNOSIS — G4730 Sleep apnea, unspecified: Secondary | ICD-10-CM | POA: Diagnosis present

## 2019-09-06 DIAGNOSIS — Z888 Allergy status to other drugs, medicaments and biological substances status: Secondary | ICD-10-CM

## 2019-09-06 DIAGNOSIS — Z20822 Contact with and (suspected) exposure to covid-19: Secondary | ICD-10-CM | POA: Diagnosis present

## 2019-09-06 DIAGNOSIS — K5900 Constipation, unspecified: Secondary | ICD-10-CM | POA: Diagnosis present

## 2019-09-06 DIAGNOSIS — Z66 Do not resuscitate: Secondary | ICD-10-CM | POA: Diagnosis present

## 2019-09-06 DIAGNOSIS — N183 Chronic kidney disease, stage 3 unspecified: Secondary | ICD-10-CM | POA: Diagnosis present

## 2019-09-06 DIAGNOSIS — J90 Pleural effusion, not elsewhere classified: Secondary | ICD-10-CM | POA: Diagnosis present

## 2019-09-06 DIAGNOSIS — E119 Type 2 diabetes mellitus without complications: Secondary | ICD-10-CM | POA: Diagnosis present

## 2019-09-06 DIAGNOSIS — I2510 Atherosclerotic heart disease of native coronary artery without angina pectoris: Secondary | ICD-10-CM | POA: Diagnosis present

## 2019-09-06 DIAGNOSIS — J9611 Chronic respiratory failure with hypoxia: Secondary | ICD-10-CM | POA: Diagnosis present

## 2019-09-06 DIAGNOSIS — Z7951 Long term (current) use of inhaled steroids: Secondary | ICD-10-CM

## 2019-09-06 DIAGNOSIS — K529 Noninfective gastroenteritis and colitis, unspecified: Secondary | ICD-10-CM

## 2019-09-06 LAB — POC OCCULT BLOOD, ED: Fecal Occult Bld: POSITIVE — AB

## 2019-09-06 MED ORDER — FENTANYL CITRATE (PF) 100 MCG/2ML IJ SOLN
25.0000 ug | INTRAMUSCULAR | Status: DC | PRN
  Administered 2019-09-07 (×3): 25 ug via INTRAVENOUS
  Filled 2019-09-06 (×4): qty 2

## 2019-09-06 NOTE — Telephone Encounter (Signed)
LMOVM for the pt daughter to take the monitor to the pt at the [place] [facility].

## 2019-09-06 NOTE — ED Triage Notes (Signed)
Pt brought in by [facility] for c/o rectal bleeding and vomiting that started today

## 2019-09-06 NOTE — ED Notes (Signed)
Dr. Reather Converse in with pt at this time

## 2019-09-07 ENCOUNTER — Emergency Department (HOSPITAL_COMMUNITY): Payer: Medicare HMO

## 2019-09-07 DIAGNOSIS — K658 Other peritonitis: Secondary | ICD-10-CM | POA: Diagnosis present

## 2019-09-07 DIAGNOSIS — K625 Hemorrhage of anus and rectum: Secondary | ICD-10-CM | POA: Diagnosis present

## 2019-09-07 DIAGNOSIS — K6389 Other specified diseases of intestine: Secondary | ICD-10-CM | POA: Diagnosis present

## 2019-09-07 DIAGNOSIS — K219 Gastro-esophageal reflux disease without esophagitis: Secondary | ICD-10-CM | POA: Diagnosis present

## 2019-09-07 DIAGNOSIS — K631 Perforation of intestine (nontraumatic): Secondary | ICD-10-CM | POA: Diagnosis present

## 2019-09-07 DIAGNOSIS — J90 Pleural effusion, not elsewhere classified: Secondary | ICD-10-CM | POA: Diagnosis present

## 2019-09-07 DIAGNOSIS — N1832 Chronic kidney disease, stage 3b: Secondary | ICD-10-CM | POA: Diagnosis not present

## 2019-09-07 DIAGNOSIS — A419 Sepsis, unspecified organism: Principal | ICD-10-CM

## 2019-09-07 DIAGNOSIS — I5022 Chronic systolic (congestive) heart failure: Secondary | ICD-10-CM | POA: Diagnosis present

## 2019-09-07 DIAGNOSIS — G4730 Sleep apnea, unspecified: Secondary | ICD-10-CM | POA: Diagnosis present

## 2019-09-07 DIAGNOSIS — K668 Other specified disorders of peritoneum: Secondary | ICD-10-CM | POA: Diagnosis not present

## 2019-09-07 DIAGNOSIS — K922 Gastrointestinal hemorrhage, unspecified: Secondary | ICD-10-CM

## 2019-09-07 DIAGNOSIS — E872 Acidosis, unspecified: Secondary | ICD-10-CM | POA: Diagnosis present

## 2019-09-07 DIAGNOSIS — I482 Chronic atrial fibrillation, unspecified: Secondary | ICD-10-CM

## 2019-09-07 DIAGNOSIS — E119 Type 2 diabetes mellitus without complications: Secondary | ICD-10-CM | POA: Diagnosis present

## 2019-09-07 DIAGNOSIS — I2510 Atherosclerotic heart disease of native coronary artery without angina pectoris: Secondary | ICD-10-CM | POA: Diagnosis present

## 2019-09-07 DIAGNOSIS — Z8619 Personal history of other infectious and parasitic diseases: Secondary | ICD-10-CM | POA: Diagnosis not present

## 2019-09-07 DIAGNOSIS — J9611 Chronic respiratory failure with hypoxia: Secondary | ICD-10-CM | POA: Diagnosis present

## 2019-09-07 DIAGNOSIS — M810 Age-related osteoporosis without current pathological fracture: Secondary | ICD-10-CM | POA: Diagnosis present

## 2019-09-07 DIAGNOSIS — Z66 Do not resuscitate: Secondary | ICD-10-CM | POA: Diagnosis present

## 2019-09-07 DIAGNOSIS — I4821 Permanent atrial fibrillation: Secondary | ICD-10-CM | POA: Diagnosis present

## 2019-09-07 DIAGNOSIS — E7800 Pure hypercholesterolemia, unspecified: Secondary | ICD-10-CM | POA: Diagnosis present

## 2019-09-07 DIAGNOSIS — K5289 Other specified noninfective gastroenteritis and colitis: Secondary | ICD-10-CM | POA: Diagnosis present

## 2019-09-07 DIAGNOSIS — Z9981 Dependence on supplemental oxygen: Secondary | ICD-10-CM | POA: Diagnosis not present

## 2019-09-07 DIAGNOSIS — R6521 Severe sepsis with septic shock: Secondary | ICD-10-CM

## 2019-09-07 DIAGNOSIS — Z515 Encounter for palliative care: Secondary | ICD-10-CM | POA: Diagnosis present

## 2019-09-07 DIAGNOSIS — Z8249 Family history of ischemic heart disease and other diseases of the circulatory system: Secondary | ICD-10-CM | POA: Diagnosis not present

## 2019-09-07 DIAGNOSIS — I110 Hypertensive heart disease with heart failure: Secondary | ICD-10-CM | POA: Diagnosis present

## 2019-09-07 DIAGNOSIS — J449 Chronic obstructive pulmonary disease, unspecified: Secondary | ICD-10-CM | POA: Diagnosis present

## 2019-09-07 DIAGNOSIS — Z20822 Contact with and (suspected) exposure to covid-19: Secondary | ICD-10-CM | POA: Diagnosis present

## 2019-09-07 DIAGNOSIS — K5900 Constipation, unspecified: Secondary | ICD-10-CM | POA: Diagnosis present

## 2019-09-07 LAB — CBC WITH DIFFERENTIAL/PLATELET
Abs Immature Granulocytes: 0.09 10*3/uL — ABNORMAL HIGH (ref 0.00–0.07)
Basophils Absolute: 0 10*3/uL (ref 0.0–0.1)
Basophils Relative: 0 %
Eosinophils Absolute: 0 10*3/uL (ref 0.0–0.5)
Eosinophils Relative: 0 %
HCT: 35.7 % — ABNORMAL LOW (ref 36.0–46.0)
Hemoglobin: 10.8 g/dL — ABNORMAL LOW (ref 12.0–15.0)
Immature Granulocytes: 1 %
Lymphocytes Relative: 8 %
Lymphs Abs: 1.2 10*3/uL (ref 0.7–4.0)
MCH: 32 pg (ref 26.0–34.0)
MCHC: 30.3 g/dL (ref 30.0–36.0)
MCV: 105.9 fL — ABNORMAL HIGH (ref 80.0–100.0)
Monocytes Absolute: 1.1 10*3/uL — ABNORMAL HIGH (ref 0.1–1.0)
Monocytes Relative: 7 %
Neutro Abs: 13.2 10*3/uL — ABNORMAL HIGH (ref 1.7–7.7)
Neutrophils Relative %: 84 %
Platelets: 426 10*3/uL — ABNORMAL HIGH (ref 150–400)
RBC: 3.37 MIL/uL — ABNORMAL LOW (ref 3.87–5.11)
RDW: 14.4 % (ref 11.5–15.5)
WBC: 15.7 10*3/uL — ABNORMAL HIGH (ref 4.0–10.5)
nRBC: 0 % (ref 0.0–0.2)

## 2019-09-07 LAB — COMPREHENSIVE METABOLIC PANEL
ALT: 37 U/L (ref 0–44)
AST: 42 U/L — ABNORMAL HIGH (ref 15–41)
Albumin: 2.8 g/dL — ABNORMAL LOW (ref 3.5–5.0)
Alkaline Phosphatase: 126 U/L (ref 38–126)
Anion gap: 9 (ref 5–15)
BUN: 33 mg/dL — ABNORMAL HIGH (ref 8–23)
CO2: 24 mmol/L (ref 22–32)
Calcium: 9.4 mg/dL (ref 8.9–10.3)
Chloride: 106 mmol/L (ref 98–111)
Creatinine, Ser: 0.94 mg/dL (ref 0.44–1.00)
GFR calc Af Amer: >60 mL/min (ref >60)
GFR calc non Af Amer: 56 mL/min — ABNORMAL LOW (ref >60)
Glucose, Bld: 228 mg/dL — ABNORMAL HIGH (ref 70–99)
Potassium: 4.5 mmol/L (ref 3.5–5.1)
Sodium: 139 mmol/L (ref 135–145)
Total Bilirubin: 0.9 mg/dL (ref 0.3–1.2)
Total Protein: 6.8 g/dL (ref 6.5–8.1)

## 2019-09-07 LAB — CBC
HCT: 35.3 % — ABNORMAL LOW (ref 36.0–46.0)
Hemoglobin: 10 g/dL — ABNORMAL LOW (ref 12.0–15.0)
MCH: 32.6 pg (ref 26.0–34.0)
MCHC: 28.3 g/dL — ABNORMAL LOW (ref 30.0–36.0)
MCV: 115 fL — ABNORMAL HIGH (ref 80.0–100.0)
Platelets: 354 10*3/uL (ref 150–400)
RBC: 3.07 MIL/uL — ABNORMAL LOW (ref 3.87–5.11)
RDW: 14.6 % (ref 11.5–15.5)
WBC: 27.4 10*3/uL — ABNORMAL HIGH (ref 4.0–10.5)
nRBC: 0 % (ref 0.0–0.2)

## 2019-09-07 LAB — URINALYSIS, ROUTINE W REFLEX MICROSCOPIC
Bilirubin Urine: NEGATIVE
Glucose, UA: NEGATIVE mg/dL
Nitrite: NEGATIVE
Protein, ur: 100 mg/dL — AB
Specific Gravity, Urine: 1.025 (ref 1.005–1.030)
pH: 7 (ref 5.0–8.0)

## 2019-09-07 LAB — TYPE AND SCREEN
ABO/RH(D): A POS
Antibody Screen: NEGATIVE

## 2019-09-07 LAB — RESPIRATORY PANEL BY RT PCR (FLU A&B, COVID)
Influenza A by PCR: NEGATIVE
Influenza B by PCR: NEGATIVE
SARS Coronavirus 2 by RT PCR: NEGATIVE

## 2019-09-07 LAB — URINALYSIS, MICROSCOPIC (REFLEX): WBC, UA: >50 WBC/hpf (ref 0–5)

## 2019-09-07 LAB — PROTIME-INR
INR: 1.8 — ABNORMAL HIGH (ref 0.8–1.2)
Prothrombin Time: 20.8 seconds — ABNORMAL HIGH (ref 11.4–15.2)

## 2019-09-07 LAB — LACTIC ACID, PLASMA
Lactic Acid, Venous: 3.6 mmol/L (ref 0.5–1.9)
Lactic Acid, Venous: >11 mmol/L (ref 0.5–1.9)
Lactic Acid, Venous: >11 mmol/L (ref 0.5–1.9)

## 2019-09-07 LAB — LIPASE, BLOOD: Lipase: 20 U/L (ref 11–51)

## 2019-09-07 MED ORDER — PIPERACILLIN-TAZOBACTAM 3.375 G IVPB 30 MIN
3.3750 g | Freq: Once | INTRAVENOUS | Status: AC
  Administered 2019-09-07: 3.375 g via INTRAVENOUS
  Filled 2019-09-07: qty 50

## 2019-09-07 MED ORDER — SODIUM CHLORIDE 0.9 % IV BOLUS
500.0000 mL | Freq: Once | INTRAVENOUS | Status: AC
  Administered 2019-09-07: 500 mL via INTRAVENOUS

## 2019-09-07 MED ORDER — MORPHINE SULFATE (PF) 4 MG/ML IV SOLN: 4.0000 mg | Freq: Once | INTRAVENOUS | Status: DC

## 2019-09-07 MED ORDER — ONDANSETRON 4 MG PO TBDP: 4.0000 mg | ORAL_TABLET | Freq: Four times a day (QID) | ORAL | Status: DC | PRN

## 2019-09-07 MED ORDER — MORPHINE SULFATE (PF) 2 MG/ML IV SOLN: 1.0000 mg | INTRAVENOUS | Status: DC | PRN

## 2019-09-07 MED ORDER — FENTANYL CITRATE (PF) 100 MCG/2ML IJ SOLN
25.0000 ug | Freq: Once | INTRAMUSCULAR | Status: AC
  Administered 2019-09-07: 25 ug via INTRAVENOUS

## 2019-09-07 MED ORDER — LACTATED RINGERS IV BOLUS
1000.0000 mL | Freq: Once | INTRAVENOUS | Status: AC
  Administered 2019-09-07: 1000 mL via INTRAVENOUS

## 2019-09-07 MED ORDER — SODIUM CHLORIDE 0.9 % IV SOLN: 1.0000 g | Freq: Once | INTRAVENOUS | Status: DC

## 2019-09-07 MED ORDER — IOHEXOL 350 MG/ML SOLN
100.0000 mL | Freq: Once | INTRAVENOUS | Status: AC | PRN
  Administered 2019-09-07: 100 mL via INTRAVENOUS

## 2019-09-07 MED ORDER — ACETAMINOPHEN 325 MG PO TABS: 650.0000 mg | ORAL_TABLET | Freq: Four times a day (QID) | ORAL | Status: DC | PRN

## 2019-09-07 MED ORDER — ACETAMINOPHEN 650 MG RE SUPP: 650.0000 mg | Freq: Four times a day (QID) | RECTAL | Status: DC | PRN

## 2019-09-07 MED ORDER — PANTOPRAZOLE SODIUM 40 MG IV SOLR
40.0000 mg | Freq: Once | INTRAVENOUS | Status: AC
  Administered 2019-09-07: 40 mg via INTRAVENOUS
  Filled 2019-09-07: qty 40

## 2019-09-07 MED ORDER — ONDANSETRON HCL 4 MG/2ML IJ SOLN: 4.0000 mg | Freq: Four times a day (QID) | INTRAMUSCULAR | Status: DC | PRN

## 2019-09-07 MED ORDER — SODIUM CHLORIDE 0.9 % IV BOLUS: 1000.0000 mL | Freq: Once | INTRAVENOUS | Status: DC

## 2019-09-07 MED ORDER — ATROPINE SULFATE 1 % OP SOLN
4.0000 [drp] | OPHTHALMIC | Status: DC | PRN
  Filled 2019-09-07: qty 2

## 2019-09-07 MED ORDER — SODIUM CHLORIDE 0.9 % IV BOLUS
1000.0000 mL | Freq: Once | INTRAVENOUS | Status: AC
  Administered 2019-09-07: 1000 mL via INTRAVENOUS

## 2019-09-08 LAB — URINE CULTURE

## 2019-09-11 NOTE — ED Notes (Signed)
Date and time results received: 10-04-19 0524  Test: Lactic  Critical Value: >11  Name of Provider Notified: [email or web address]  Orders Received? Or Actions Taken?: na

## 2019-09-11 NOTE — Consult Note (Signed)
Reason for Consult: Pneumatosis of colon, pneumoperitoneum, sepsis Referring Physician: Dr. Fidela Juneau is an 85 y.o. female.  HPI: Patient is an 85 year old black female who presented to the emergency room with worsening abdominal pain.  Patient states it started earlier in the day.  She states she has been constipated.  A CT scan of the abdomen was performed which revealed a severe stercoaral colitis with extensive distention of the sigmoid colon, pneumatosis, free air, and free fluid consistent with possible succus.  Patient has multiple comorbidities including coronary artery disease, atrial fibrillation on Coumadin, [employer]. Jude[employer] pacemaker placement, history of Covid pneumonia, COPD with chronic respiratory insufficiency.  Patient states that she is in too much pain when lying in the bed.  Past Medical History:  Diagnosis Date  . Abnormal CT of the chest    Mediastinal and hilar adenopathy followed by Dr. Koleen Nimrod in [place]  . Anemia, unspecified   . Body mass index (BMI) of 20.0-20.9 in adult FEB 2013 147 LBS  . CAD (coronary artery disease) 2011   Catheterization August 2011: mild nonobstructive coronary artery disease complicated by large left rectus sheath hematoma status post evacuation Coumadin restarted without recurrence  . Carotid artery disease ([place])    Doppler, 05/2012, 0-39% bilateral  . Cataract   . Chronic airway obstruction, not elsewhere classified   . Chronic kidney disease, stage II (mild)   . Congestive heart failure, unspecified    EF now 60%, previous nonischemic cardiomyopathy  . COVID-19   . Diabetes mellitus   . Dilated bile duct 10/19/2011  . Diverticulitis Dec 2012  . Gastritis    By EGD  . GERD (gastroesophageal reflux disease)   . Hypercholesterolemia   . Hypertension   . Mitral regurgitation 06/27/2013  . Neurogenic sleep apnea    Uses noctural oxygen prescribed by her pulmonologi  . Pacemaker    Single chamber [place] Jude ACCENT [phone] SN 719  04/11/1957 10/31/2010  . Permanent atrial fibrillation (HCC)    H/o tachybradycardia syndrome on Coumadin anticoagulation, has pacemaker.  . Recurrent UTI   . Rheumatic heart disease   . Tachycardia-bradycardia [facility])    s/p St. Jude single chamber PPM 10/2010   . Unspecified hypertensive kidney disease with chronic kidney disease stage I through stage IV, or unspecified(403.90)   . Valvular heart disease    a. H/o moderate mitral stenosis by cath 2011 but no mention of this on 10/2011 echo. b. 10/2011 echo: mod MR, mild AS, mild TR; EF>65%  . Vitamin D deficiency     Past Surgical History:  Procedure Laterality Date  . CHOLECYSTECTOMY     40+ years ago  . COLONOSCOPY  10/2011   Dyann Ruddle sigmoid to descending diverticulosis, internal hemorrhoids  . ESOPHAGOGASTRODUODENOSCOPY  10/2011   Fields-erosive gastritis, biopsy with no H. pylori, hiatal hernia, peptic duodenitis, no celiac disease, duodenal diverticulum, duodenal nodule  . EUS  11/16/11   Mishra-13 MM CBD, normal pancreatic duct, otherwise normal EUS  . EUS  April and Sept 2019    dilated CBD of 13 mm, normal pancreatic duct, no pancreatic mass. Unable to see repeat EUS results from Sept 2019 at [facility], but telephone notes report benign-appearing CBD dilatation.  . Evacuation of retroperitoneal and left rectus sheath    . HEMATOMA EVACUATION     femoral  . HERNIA REPAIR     X3  . PILONIDAL CYST / SINUS EXCISION    . Single-chamber pacemaker implantation  3/12   by Dr  Caryl Comes    Family History  Problem Relation Age of Onset  . Cancer Mother        cervical  . Stroke Mother   . Cancer Daughter 42       kidney, lung  . Heart disease Father   . Heart attack Father   . Ulcers Father   . Early death Sister 1       died at 63 months old  . Hypertension Brother   . Cancer Paternal Aunt        breast  . Hypertension Son   . Colon cancer Neg Hx     Social History:  reports that she quit smoking about 4 months ago.  Her smoking use included cigarettes. She started smoking about 61 years ago. She has a 11.25 pack-year smoking history. She has never used smokeless tobacco. She reports that she does not drink alcohol or use drugs.  Allergies:  Allergies  Allergen Reactions  . Chlorhexidine   . Macrobid [Nitrofurantoin Monohyd Macro] Rash  . Torsemide Nausea And Vomiting  . Tramadol Nausea Only    Medications: I have reviewed the patient's current medications. Prior to Admission: (Not in a hospital admission)   Results for orders placed or performed during the hospital encounter of 08/28/2019 (from the past 48 hour(s))  POC occult blood, ED     Status: Abnormal   Collection Time: 09/10/2019 11:42 PM  Result Value Ref Range   Fecal Occult Bld POSITIVE (A) NEGATIVE  Comprehensive metabolic panel     Status: Abnormal   Collection Time: 08/14/2019 11:57 PM  Result Value Ref Range   Sodium 139 135 - 145 mmol/L   Potassium 4.5 3.5 - 5.1 mmol/L   Chloride 106 98 - 111 mmol/L   CO2 24 22 - 32 mmol/L   Glucose, Bld 228 (H) 70 - 99 mg/dL   BUN 33 (H) 8 - 23 mg/dL   Creatinine, Ser 0.94 0.44 - 1.00 mg/dL   Calcium 9.4 8.9 - 10.3 mg/dL   Total Protein 6.8 6.5 - 8.1 g/dL   Albumin 2.8 (L) 3.5 - 5.0 g/dL   AST 42 (H) 15 - 41 U/L   ALT 37 0 - 44 U/L   Alkaline Phosphatase 126 38 - 126 U/L   Total Bilirubin 0.9 0.3 - 1.2 mg/dL   GFR calc non Af Amer 56 (L) >60 mL/min   GFR calc Af Amer >60 >60 mL/min   Anion gap 9 5 - 15    Comment: Performed at [facility], [address] [place], [place] [address]  CBC with Differential     Status: Abnormal   Collection Time: 09/09/2019 11:57 PM  Result Value Ref Range   WBC 15.7 (H) 4.0 - 10.5 K/uL   RBC 3.37 (L) 3.87 - 5.11 MIL/uL   Hemoglobin 10.8 (L) 12.0 - 15.0 g/dL   HCT 35.7 (L) 36.0 - 46.0 %   MCV 105.9 (H) 80.0 - 100.0 fL   MCH 32.0 26.0 - 34.0 pg   MCHC 30.3 30.0 - 36.0 g/dL   RDW 14.4 11.5 - 15.5 %   Platelets 426 (H) 150 - 400 K/uL   nRBC 0.0 0.0 - 0.2 %    Neutrophils Relative % 84 %   Neutro Abs 13.2 (H) 1.7 - 7.7 K/uL   Lymphocytes Relative 8 %   Lymphs Abs 1.2 0.7 - 4.0 K/uL   Monocytes Relative 7 %   Monocytes Absolute 1.1 (H) 0.1 - 1.0 K/uL  Eosinophils Relative 0 %   Eosinophils Absolute 0.0 0.0 - 0.5 K/uL   Basophils Relative 0 %   Basophils Absolute 0.0 0.0 - 0.1 K/uL   Immature Granulocytes 1 %   Abs Immature Granulocytes 0.09 (H) 0.00 - 0.07 K/uL    Comment: Performed at [facility], [address], [place], [place] [address]  Protime-INR     Status: Abnormal   Collection Time: 08/12/2019 11:57 PM  Result Value Ref Range   Prothrombin Time 20.8 (H) 11.4 - 15.2 seconds   INR 1.8 (H) 0.8 - 1.2    Comment: (NOTE) INR goal varies based on device and disease states. Performed at [facility], [address], [place], [place] [address]   Type and screen     Status: None   Collection Time: 08/13/2019 11:57 PM  Result Value Ref Range   ABO/RH(D) A POS    Antibody Screen NEG    Sample Expiration      09/09/2019,2359 Performed at [facility], [address], [place], [place] [address]   Lactic acid, plasma     Status: Abnormal   Collection Time: 09/03/2019 11:57 PM  Result Value Ref Range   Lactic Acid, Venous 3.6 (HH) 0.5 - 1.9 mmol/L    Comment: CRITICAL RESULT CALLED TO, READ BACK BY AND VERIFIED WITH: T TALBOT,RN@0037  2019-09-26 MKELLY Performed at [facility], [address], [place], [place] [address]   Lipase, blood     Status: None   Collection Time: 09/02/2019 11:57 PM  Result Value Ref Range   Lipase 20 11 - 51 U/L    Comment: Performed at [facility], [address], [place], [place] [address]  Respiratory Panel by RT PCR (Flu A&B, Covid) - Nasopharyngeal Swab     Status: None   Collection Time: Sep 26, 2019  2:14 AM   Specimen: Nasopharyngeal Swab  Result Value Ref Range   SARS Coronavirus 2 by RT PCR NEGATIVE NEGATIVE    Comment: (NOTE) SARS-CoV-2 target nucleic acids are NOT DETECTED. The SARS-CoV-2 RNA is  generally detectable in upper respiratoy specimens during the acute phase of infection. The lowest concentration of SARS-CoV-2 viral copies this assay can detect is 131 copies/mL. A negative result does not preclude SARS-Cov-2 infection and should not be used as the sole basis for treatment or other patient management decisions. A negative result may occur with  improper specimen collection/handling, submission of specimen other than nasopharyngeal swab, presence of viral mutation(s) within the areas targeted by this assay, and inadequate number of viral copies (<131 copies/mL). A negative result must be combined with clinical observations, patient history, and epidemiological information. The expected result is Negative. Fact Sheet for Patients:  PinkCheek.be Fact Sheet for Healthcare Providers:  GravelBags.it This test is not yet ap proved or cleared by the [place] FDA and  has been authorized for detection and/or diagnosis of SARS-CoV-2 by FDA under an Emergency Use Authorization (EUA). This EUA will remain  in effect (meaning this test can be used) for the duration of the COVID-19 declaration under Section 564(b)(1) of the Act, 21 U.S.C. section 360bbb-3(b)(1), unless the authorization is terminated or revoked sooner.    Influenza A by PCR NEGATIVE NEGATIVE   Influenza B by PCR NEGATIVE NEGATIVE    Comment: (NOTE) The Xpert Xpress SARS-CoV-2/FLU/RSV assay is intended as an aid in  the diagnosis of influenza from Nasopharyngeal swab specimens and  should not be used as a sole basis for treatment. Nasal washings and  aspirates are unacceptable  for Xpert Xpress SARS-CoV-2/FLU/RSV  testing. Fact Sheet for Patients: PinkCheek.be Fact Sheet for Healthcare Providers: GravelBags.it This test is not yet approved or cleared by the [place] FDA and  has been  authorized for detection and/or diagnosis of SARS-CoV-2 by  FDA under an Emergency Use Authorization (EUA). This EUA will remain  in effect (meaning this test can be used) for the duration of the  Covid-19 declaration under Section 564(b)(1) of the Act, 21  U.S.C. section 360bbb-3(b)(1), unless the authorization is  terminated or revoked. Performed at [facility], [address], [place], [place] [address]   Urinalysis, Routine w reflex microscopic     Status: Abnormal   Collection Time: 09-30-19  2:15 AM  Result Value Ref Range   Color, Urine YELLOW YELLOW   APPearance TURBID (A) CLEAR   Specific Gravity, Urine 1.025 1.005 - 1.030   pH 7.0 5.0 - 8.0   Glucose, UA NEGATIVE NEGATIVE mg/dL   Hgb urine dipstick LARGE (A) NEGATIVE   Bilirubin Urine NEGATIVE NEGATIVE   Ketones, ur TRACE (A) NEGATIVE mg/dL   Protein, ur 100 (A) NEGATIVE mg/dL   Nitrite NEGATIVE NEGATIVE   Leukocytes,Ua LARGE (A) NEGATIVE    Comment: Performed at [facility], [address], [place], [place] [address]  Urinalysis, Microscopic (reflex)     Status: Abnormal   Collection Time: 09/30/2019  2:15 AM  Result Value Ref Range   RBC / HPF 0-5 0 - 5 RBC/hpf   WBC, UA >50 0 - 5 WBC/hpf   Bacteria, UA MANY (A) NONE SEEN   Squamous Epithelial / LPF 0-5 0 - 5   Amorphous Crystal PRESENT     Comment: Performed at [facility], [address], [place], [place] [address]  Lactic acid, plasma     Status: Abnormal   Collection Time: September 30, 2019  4:55 AM  Result Value Ref Range   Lactic Acid, Venous >11.0 (HH) 0.5 - 1.9 mmol/L    Comment: CRITICAL RESULT CALLED TO, READ BACK BY AND VERIFIED WITH: ANDREW,L AT 5:25AM ON 2019/09/30 BY [facility] Performed at [facility], [address] [place],  [address]    *Note: Due to a large number of results and/or encounters for the requested time period, some results have not been displayed. A complete set of results can be found in Results Review.    CT ABDOMEN PELVIS WO  CONTRAST  Result Date: 09/30/19 CLINICAL DATA:  Abdominal pain and rectal bleeding, no IV access go to left bad news bears in a if EXAM: CT ABDOMEN AND PELVIS WITHOUT CONTRAST TECHNIQUE: Multidetector CT imaging of the abdomen and pelvis was performed following the standard protocol without IV contrast. COMPARISON:  CT abdomen pelvis 08/10/2019 FINDINGS: Lower chest: Persistent moderate left pleural effusion with subtotal atelectatic collapse of the left lower lobe. Few punctate calcifications in the lung parenchyma likely reflect sequela of remote granulomatous disease. Leftward mediastinal shift is likely related to the left basilar volume loss and patient positioning. Heart is borderline enlarged. There is extensive coronary artery calcification. Aortic leaflet calcifications are present as well. Cardiac pacer leads are noted at the apex. No pericardial effusion. Hepatobiliary: No focal liver lesions are seen. Severe distention of the extrahepatic common bile duct is similar to comparison ultrasound 08/11/2019 measuring up to 2.1 cm in maximal diameter (5/34). Some moderate intrahepatic ductal dilatation is noted as well. Gallbladder is surgically absent. Pancreas: Pancreatic atrophy. No peripancreatic inflammation or ductal dilatation. Spleen: Normal in size without focal abnormality. Adrenals/Urinary Tract:  Bilateral renal atrophy, left slightly more pronounced on the right. Mild right hydronephrosis without obstructing calculus though dilatation of the right ureter may be secondary to mass effect compression by the enlarged stool-filled colon. Circumferential thickening of the urinary bladder is noted. Stomach/Bowel: The distal esophagus is patulous and fluid-filled with a moderate hiatal hernia. High attenuation material layering within the stomach is nonspecific, correlate for ingested material. Much of the small bowel appears grossly decompressed. There is diffuse edematous mural thickening throughout  the colon with marked tortuosity of the sigmoid and extensive colonic diverticulosis. There is an extreme volume of fecal material extending throughout much of the sigmoid distending the bowel up to 10.7 cm in diameter with mural thickening and extensive pneumatosis which is new since interval old CT performed 08/10/2019. Extensive pericolonic inflammation and free fluid is seen as well worrisome for perforation though the site of which is difficult to ascertain on imaging. Vascular/Lymphatic: Extensive atherosclerosis of the aorta and branch vessels. Extensive reactive adenopathy is seen in the mid mesentery. No pathologically enlarged nodes are clearly evident though evaluation is limited in the absence of contrast media. Reproductive: Atrophic appearance of the uterus is age-appropriate. No concerning adnexal lesions. Other: Extensive free fluid noted throughout the pelvis. Free air is seen tracking anterior to the liver. A intermediate collection of fluid is seen anterolateral to the bladder measuring up to 32 Hounsfield units. Succus or hemorrhage could have this appearance. There is extreme circumferential body wall edema. More focal thickening is noted around the level of the sacrum. Should be correlated for features of decubitus ulceration. Musculoskeletal: Curvature of the spine is similar to priors. The osseous structures appear diffusely demineralized which may limit detection of small or nondisplaced fractures. No acute osseous abnormality or suspicious osseous lesion. Multilevel degenerative changes are present in the imaged portions of the spine. Additional degenerative changes both hips and at the SI joints. IMPRESSION: 1. Features of severe stercoral colitis with distension of the colon to 10.7 cm and extensive pneumatosis and bowel thickening throughout the entirety of the sigmoid colon. Surrounding pericolonic inflammation is present as well as free air and free intermediate attenuation fluid in  the pelvis worrisome for possible succus or hemoperitoneum. Urgent surgical consultation is warranted. 2. Mild right ureteral dilatation possibly secondary to compression by the stool filled colon. 3. Circumferential thickening of the urinary bladder, potentially reactive though should correlate with urinalysis to exclude cystitis. 4. Persistent moderate left pleural effusion with subtotal atelectatic collapse of the left lower lobe. 5. Moderate intrahepatic and severe extrahepatic biliary ductal dilatation. Greater than expected for age and post cholecystectomy state. Finding is similar to comparison ultrasound 08/11/2019. No visible is intraductal gallstones though may consider MRCP in the nonemergent setting if further evaluation is warranted. Critical Value/emergent results were called by telephone at the time of interpretation on 09/26/2019 at 4:27 am to provider [facility] , who verbally acknowledged these results. Electronically Signed   By: [name] M.D.   On: 2019-09-26 04:27   DG Chest Portable 1 View  Result Date: Sep 26, 2019 CLINICAL DATA:  COPD EXAM: PORTABLE CHEST 1 VIEW COMPARISON:  08/15/2019 FINDINGS: Left pacer remains in place, unchanged. Cardiomegaly, aortic atherosclerosis. Improving opacity in the left lung. Continued left lower lobe consolidation and probable left effusion. No confluent opacity on the right. IMPRESSION: Left lower lobe airspace disease with probable left effusion. This has improved since prior study. Cardiomegaly, aortic atherosclerosis. Electronically Signed   By: Rolm Baptise M.D.   On: 09-26-19  02:12    ROS:  Pertinent items are noted in HPI.  Blood pressure (!) 82/51, pulse (!) 109, temperature 97.7 F (36.5 C), temperature source Oral, resp. rate (!) 23, weight 57.6 kg, SpO2 100 %. Physical Exam: Pleasant fatigued black female no acute distress Head is normocephalic, atraumatic Abdomen is distended with bowel wall laxity noted with the midline  incision and evidence of previous hernia repair.  Tenderness noted throughout the abdomen.  She appears to be distended.  No bowel sounds appreciated.  CT scan images personally reviewed Labs reviewed  Assessment/Plan: Impression: Acute abdomen secondary to sigmoid colon pneumatosis with perforation and probable fecal peritonitis.  Latest lactic acid level greater than 11.  Prognosis is grave.  I did discuss with the patient that surgical intervention would require a colectomy with colostomy and postoperative ventilatory support.  Given her multiple comorbidities, she may not even survive surgical intervention.  Patient does not want surgical intervention and wants comfort care only.  She had previously stated that she did not want any prolonged intubation.  She fully realizes that she will pass away from this acute process. I have made her DNR with no surgical intervention.  Aviva Signs 2019-10-04, 6:04 AM

## 2019-09-11 NOTE — ED Notes (Signed)
Family at bedside. 

## 2019-09-11 NOTE — H&P (Signed)
History and Physical    [name] SEG:[id number] DOB: 1935-02-22 DOA: 08/16/2019  PCP: [name], FNP  Patient coming from: Skilled nursing facility  I have personally briefly reviewed patient's old medical records in [place]  Chief Complaint: Abdominal pain  HPI: [name] is a 85 y.o. female with a complicated past medical history including chronic atrial fib, COPD with chronic respiratory failure with hypoxia on chronic oxygen, chronic kidney disease stage III chronic urinary retention, diabetes, tachybradycardia syndrome status post pacemaker, presents to the emergency room with worsening abdominal pain and rectal bleeding.  History is limited since patient is unresponsive and family cannot provide any further history.  History is obtained from medical record.  Patient stated her symptoms started earlier in the day.  She has been dealing with constipation.  She is also had vomiting.  CT scan of the abdomen and pelvis was performed in the emergency room that showed severe stercoral colitis with extensive distention of the sigmoid colon, pneumatosis, free air and free fluid consistent with possible succus or hemoperitoneum.  ED Course: She was noted to be hypotensive in the emergency room.  Lactic acid markedly elevated greater than 11.  WBC count increased from 15.7-27.4 overnight.  She was seen by general surgery and after discussing with patient, patient felt that she would not want to undergo surgery.  She would also be considered high risk for surgery and mortality risk would be very high.  Patient elected a more comfort care approach  Review of Systems: Unable to assess at this time due to mental status   Past Medical History:  Diagnosis Date  . Abnormal CT of the chest    Mediastinal and hilar adenopathy followed by Dr. Koleen Nimrod in [place]  . Anemia, unspecified   . Body mass index (BMI) of 20.0-20.9 in adult FEB 2013 147 LBS  . CAD (coronary artery  disease) 2011   Catheterization August 2011: mild nonobstructive coronary artery disease complicated by large left rectus sheath hematoma status post evacuation Coumadin restarted without recurrence  . Carotid artery disease ([place])    Doppler, 05/2012, 0-39% bilateral  . Cataract   . Chronic airway obstruction, not elsewhere classified   . Chronic kidney disease, stage II (mild)   . Congestive heart failure, unspecified    EF now 60%, previous nonischemic cardiomyopathy  . COVID-19   . Diabetes mellitus   . Dilated bile duct 10/19/2011  . Diverticulitis Dec 2012  . Gastritis    By EGD  . GERD (gastroesophageal reflux disease)   . Hypercholesterolemia   . Hypertension   . Mitral regurgitation 06/27/2013  . Neurogenic sleep apnea    Uses noctural oxygen prescribed by her pulmonologi  . Pacemaker    Single chamber [place] Jude ACCENT [phone] SN 719 04/11/1959 10/31/2010  . Permanent atrial fibrillation (HCC)    H/o tachybradycardia syndrome on Coumadin anticoagulation, has pacemaker.  . Recurrent UTI   . Rheumatic heart disease   . Tachycardia-bradycardia [facility])    s/p St. Jude single chamber PPM 10/2010   . Unspecified hypertensive kidney disease with chronic kidney disease stage I through stage IV, or unspecified(403.90)   . Valvular heart disease    a. H/o moderate mitral stenosis by cath 2011 but no mention of this on 10/2011 echo. b. 10/2011 echo: mod MR, mild AS, mild TR; EF>65%  . Vitamin D deficiency     Past Surgical History:  Procedure Laterality Date  . CHOLECYSTECTOMY     40+ years ago  .  COLONOSCOPY  10/2011   Dyann Ruddle sigmoid to descending diverticulosis, internal hemorrhoids  . ESOPHAGOGASTRODUODENOSCOPY  10/2011   Fields-erosive gastritis, biopsy with no H. pylori, hiatal hernia, peptic duodenitis, no celiac disease, duodenal diverticulum, duodenal nodule  . EUS  11/16/11   Mishra-13 MM CBD, normal pancreatic duct, otherwise normal EUS  . EUS  April and Sept 2019     dilated CBD of 13 mm, normal pancreatic duct, no pancreatic mass. Unable to see repeat EUS results from Sept 2019 at [facility], but telephone notes report benign-appearing CBD dilatation.  . Evacuation of retroperitoneal and left rectus sheath    . HEMATOMA EVACUATION     femoral  . HERNIA REPAIR     X3  . PILONIDAL CYST / SINUS EXCISION    . Single-chamber pacemaker implantation  3/12   by Dr Caryl Comes    Social History:  reports that she quit smoking about 4 months ago. Her smoking use included cigarettes. She started smoking about 61 years ago. She has a 11.25 pack-year smoking history. She has never used smokeless tobacco. She reports that she does not drink alcohol or use drugs.  Allergies  Allergen Reactions  . Chlorhexidine   . Macrobid [Nitrofurantoin Monohyd Macro] Rash  . Torsemide Nausea And Vomiting  . Tramadol Nausea Only    Family History  Problem Relation Age of Onset  . Cancer Mother        cervical  . Stroke Mother   . Cancer Daughter 46       kidney, lung  . Heart disease Father   . Heart attack Father   . Ulcers Father   . Early death Sister 1       died at 37 months old  . Hypertension Brother   . Cancer Paternal Aunt        breast  . Hypertension Son   . Colon cancer Neg Hx     Prior to Admission medications   Medication Sig Start Date End Date Taking? Authorizing Provider  budesonide-formoterol (SYMBICORT) 160-4.5 MCG/ACT inhaler Inhale 2 puffs into the lungs 2 (two) times daily. 01/13/19   Hassell Done, Mary-Margaret, FNP  clonazePAM (KLONOPIN) 0.5 MG tablet Take 1 tablet (0.5 mg total) by mouth 3 (three) times daily as needed for anxiety. 08/16/19   Johnson, Clanford L, MD  diltiazem (CARDIZEM) 120 MG tablet Take 1 tablet (120 mg total) by mouth daily. 05/09/19   [name], FNP  ferrous sulfate 325 (65 FE) MG tablet Take 1 tablet (325 mg total) by mouth daily with breakfast. 10/29/15   Cherre Robins, PharmD  furosemide (LASIX) 20 MG tablet Take 1  tablet (20 mg total) by mouth every other day. 08/18/19   Johnson, Clanford L, MD  HYDROcodone-acetaminophen (NORCO) 7.5-325 MG tablet Take 1 tablet by mouth 3 (three) times daily as needed for severe pain (pain). 08/16/19   Johnson, Clanford L, MD  ipratropium-albuterol (DUONEB) 0.5-2.5 (3) MG/3ML SOLN INHALE THE CONTENTS OF 1 VIAL VIA NEBULIZER EVERY 4 HOURS AS NEEDED Patient taking differently: Take 3 mLs by nebulization every 4 (four) hours as needed (for shortness of breath/wheezing).  04/27/19   Kathie Dike, MD  linaclotide Rolan Lipa) 145 MCG CAPS capsule Take 1 capsule (145 mcg total) by mouth daily before breakfast. 08/07/19   Barton Dubois, MD  lipase/protease/amylase (CREON) 36000 UNITS CPEP capsule Take 1 capsule (36,000 Units total) by mouth See admin instructions. Take 3 capsules with meals and take 1 capsule with a snack as directed 08/16/19  Johnson, Clanford L, MD  Multiple Vitamins-Minerals (PRESERVISION AREDS 2) CAPS Take 1 capsule by mouth daily.     [provider]  nitroGLYCERIN (NITROSTAT) 0.4 MG SL tablet DISSOLVE 1 TABLET UNDER THE TONGUE EVERY 5 MINUTES AS NEEDED FOR CHEST PAIN AS DIRECTED Patient taking differently: Place 0.4 mg under the tongue every 5 (five) minutes as needed for chest pain.  04/28/19   Herminio Commons, MD  OXYGEN Inhale 2 L into the lungs at bedtime. 2lpm with sleep only     [provider]  pantoprazole (PROTONIX) 40 MG tablet Take 1 tablet (40 mg total) by mouth 2 (two) times daily. 06/23/19   Hassell Done, Mary-Margaret, FNP  potassium chloride SA (KLOR-CON) 20 MEQ tablet Take 1 tablet (20 mEq total) by mouth every other day. 08/18/19   Johnson, Clanford L, MD  simvastatin (ZOCOR) 10 MG tablet Take 1 tablet (10 mg total) by mouth daily. 05/09/19   Hassell Done, Mary-Margaret, FNP  sitaGLIPtin (JANUVIA) 50 MG tablet TAKE 1 TABLET (50 MG TOTAL) BY MOUTH DAILY. Patient taking differently: Take 50 mg by mouth daily.  05/09/19   Hassell Done, Mary-Margaret, FNP   VENTOLIN HFA 108 (90 Base) MCG/ACT inhaler USE 2 PUFFS EVERY 6 HOURS AS NEEDED Patient taking differently: Inhale 2 puffs into the lungs every 6 (six) hours as needed for wheezing or shortness of breath.  01/18/17   [name], FNP  warfarin (COUMADIN) 2 MG tablet Take 0.5 tablets (1 mg total) by mouth every evening. TAKE 1/2  TABLET (1 MG TOTAL) DAILY AT 6 PM.Get INR check by PCP the day after you are discharged 05/18/19   Thurnell Lose, MD    Physical Exam: Vitals:   09/29/2019 0900 09-29-19 0930 29-Sep-2019 0935 09/29/2019 0942  BP: (!) 58/46 (!) 74/42 (!) 72/40   Pulse:    80  Resp: (!) 27 (!) 25 (!) 21 (!) 24  Temp:      TempSrc:      SpO2:    99%  Weight:        Constitutional: Unresponsive, no distress Eyes: PERRL, lids and conjunctivae normal ENMT: Mucous membranes are moist. Posterior pharynx clear of any exudate or lesions.Normal dentition.  Neck: normal, supple, no masses, no thyromegaly Respiratory: clear to auscultation bilaterally, no wheezing, no crackles. Normal respiratory effort. No accessory muscle use.  Cardiovascular: Regular rate and rhythm, no murmurs / rubs / gallops. No extremity edema. 2+ pedal pulses. No carotid bruits.  Abdomen: Soft, she does not grimace to palpation, no masses palpated. No hepatosplenomegaly. Bowel sounds positive.  Ventral hernias present Musculoskeletal: no clubbing / cyanosis. No joint deformity upper and lower extremities. Good ROM, no contractures. Normal muscle tone.  Skin: no rashes, lesions, ulcers. No induration Neurologic: Unable to assess due to mental status.  Psychiatric: Unable to assess due to mental status   Labs on Admission: I have personally reviewed following labs and imaging studies  CBC: Recent Labs  Lab 08/16/2019 2357 September 29, 2019 0618  WBC 15.7* 27.4*  NEUTROABS 13.2*  --   HGB 10.8* 10.0*  HCT 35.7* 35.3*  MCV 105.9* 115.0*  PLT 426* 244   Basic Metabolic Panel: Recent Labs  Lab 08/12/2019 2357    NA 139  K 4.5  CL 106  CO2 24  GLUCOSE 228*  BUN 33*  CREATININE 0.94  CALCIUM 9.4   GFR: Estimated Creatinine Clearance: 36.5 mL/min (by C-G formula based on SCr of 0.94 mg/dL). Liver Function Tests: Recent Labs  Lab 08/23/2019 2357  AST 42*  ALT 37  ALKPHOS 126  BILITOT 0.9  PROT 6.8  ALBUMIN 2.8*   Recent Labs  Lab 08/12/2019 2357  LIPASE 20   No results for input(s): AMMONIA in the last 168 hours. Coagulation Profile: Recent Labs  Lab 08/27/2019 2357  INR 1.8*   Cardiac Enzymes: No results for input(s): CKTOTAL, CKMB, CKMBINDEX, TROPONINI in the last 168 hours. BNP (last 3 results) No results for input(s): PROBNP in the last 8760 hours. HbA1C: No results for input(s): HGBA1C in the last 72 hours. CBG: No results for input(s): GLUCAP in the last 168 hours. Lipid Profile: No results for input(s): CHOL, HDL, LDLCALC, TRIG, CHOLHDL, LDLDIRECT in the last 72 hours. Thyroid Function Tests: No results for input(s): TSH, T4TOTAL, FREET4, T3FREE, THYROIDAB in the last 72 hours. Anemia Panel: No results for input(s): VITAMINB12, FOLATE, FERRITIN, TIBC, IRON, RETICCTPCT in the last 72 hours. Urine analysis:    Component Value Date/Time   COLORURINE YELLOW 2019/10/01 0215   APPEARANCEUR TURBID (A) Oct 01, 2019 0215   APPEARANCEUR Cloudy (A) 08/25/2017 1732   LABSPEC 1.025 10-01-2019 0215   PHURINE 7.0 10/01/2019 0215   GLUCOSEU NEGATIVE 10/01/19 0215   HGBUR LARGE (A) October 01, 2019 0215   BILIRUBINUR NEGATIVE 10/01/2019 0215   BILIRUBINUR Negative 08/25/2017 1732   KETONESUR TRACE (A) 10/01/2019 0215   PROTEINUR 100 (A) October 01, 2019 0215   UROBILINOGEN negative 02/28/2015 1602   UROBILINOGEN 0.2 08/21/2013 1922   NITRITE NEGATIVE 01-Oct-2019 0215   LEUKOCYTESUR LARGE (A) 10-01-19 0215    Radiological Exams on Admission: CT ABDOMEN PELVIS WO CONTRAST  Result Date: 01-Oct-2019 CLINICAL DATA:  Abdominal pain and rectal bleeding, no IV access go to left bad news  bears in a if EXAM: CT ABDOMEN AND PELVIS WITHOUT CONTRAST TECHNIQUE: Multidetector CT imaging of the abdomen and pelvis was performed following the standard protocol without IV contrast. COMPARISON:  CT abdomen pelvis 08/10/2019 FINDINGS: Lower chest: Persistent moderate left pleural effusion with subtotal atelectatic collapse of the left lower lobe. Few punctate calcifications in the lung parenchyma likely reflect sequela of remote granulomatous disease. Leftward mediastinal shift is likely related to the left basilar volume loss and patient positioning. Heart is borderline enlarged. There is extensive coronary artery calcification. Aortic leaflet calcifications are present as well. Cardiac pacer leads are noted at the apex. No pericardial effusion. Hepatobiliary: No focal liver lesions are seen. Severe distention of the extrahepatic common bile duct is similar to comparison ultrasound 08/11/2019 measuring up to 2.1 cm in maximal diameter (5/34). Some moderate intrahepatic ductal dilatation is noted as well. Gallbladder is surgically absent. Pancreas: Pancreatic atrophy. No peripancreatic inflammation or ductal dilatation. Spleen: Normal in size without focal abnormality. Adrenals/Urinary Tract: Bilateral renal atrophy, left slightly more pronounced on the right. Mild right hydronephrosis without obstructing calculus though dilatation of the right ureter may be secondary to mass effect compression by the enlarged stool-filled colon. Circumferential thickening of the urinary bladder is noted. Stomach/Bowel: The distal esophagus is patulous and fluid-filled with a moderate hiatal hernia. High attenuation material layering within the stomach is nonspecific, correlate for ingested material. Much of the small bowel appears grossly decompressed. There is diffuse edematous mural thickening throughout the colon with marked tortuosity of the sigmoid and extensive colonic diverticulosis. There is an extreme volume of fecal  material extending throughout much of the sigmoid distending the bowel up to 10.7 cm in diameter with mural thickening and extensive pneumatosis which is new since interval old CT performed 08/10/2019. Extensive pericolonic inflammation and  free fluid is seen as well worrisome for perforation though the site of which is difficult to ascertain on imaging. Vascular/Lymphatic: Extensive atherosclerosis of the aorta and branch vessels. Extensive reactive adenopathy is seen in the mid mesentery. No pathologically enlarged nodes are clearly evident though evaluation is limited in the absence of contrast media. Reproductive: Atrophic appearance of the uterus is age-appropriate. No concerning adnexal lesions. Other: Extensive free fluid noted throughout the pelvis. Free air is seen tracking anterior to the liver. A intermediate collection of fluid is seen anterolateral to the bladder measuring up to 32 Hounsfield units. Succus or hemorrhage could have this appearance. There is extreme circumferential body wall edema. More focal thickening is noted around the level of the sacrum. Should be correlated for features of decubitus ulceration. Musculoskeletal: Curvature of the spine is similar to priors. The osseous structures appear diffusely demineralized which may limit detection of small or nondisplaced fractures. No acute osseous abnormality or suspicious osseous lesion. Multilevel degenerative changes are present in the imaged portions of the spine. Additional degenerative changes both hips and at the SI joints. IMPRESSION: 1. Features of severe stercoral colitis with distension of the colon to 10.7 cm and extensive pneumatosis and bowel thickening throughout the entirety of the sigmoid colon. Surrounding pericolonic inflammation is present as well as free air and free intermediate attenuation fluid in the pelvis worrisome for possible succus or hemoperitoneum. Urgent surgical consultation is warranted. 2. Mild right  ureteral dilatation possibly secondary to compression by the stool filled colon. 3. Circumferential thickening of the urinary bladder, potentially reactive though should correlate with urinalysis to exclude cystitis. 4. Persistent moderate left pleural effusion with subtotal atelectatic collapse of the left lower lobe. 5. Moderate intrahepatic and severe extrahepatic biliary ductal dilatation. Greater than expected for age and post cholecystectomy state. Finding is similar to comparison ultrasound 08/11/2019. No visible is intraductal gallstones though may consider MRCP in the nonemergent setting if further evaluation is warranted. Critical Value/emergent results were called by telephone at the time of interpretation on 2019/09/13 at 4:27 am to provider [facility] , who verbally acknowledged these results. Electronically Signed   By: [name] M.D.   On: 09-13-2019 04:27   DG Chest Portable 1 View  Result Date: 09/13/19 CLINICAL DATA:  COPD EXAM: PORTABLE CHEST 1 VIEW COMPARISON:  08/15/2019 FINDINGS: Left pacer remains in place, unchanged. Cardiomegaly, aortic atherosclerosis. Improving opacity in the left lung. Continued left lower lobe consolidation and probable left effusion. No confluent opacity on the right. IMPRESSION: Left lower lobe airspace disease with probable left effusion. This has improved since prior study. Cardiomegaly, aortic atherosclerosis. Electronically Signed   By: Rolm Baptise M.D.   On: Sep 13, 2019 02:12     Assessment/Plan Active Problems:   Type 2 diabetes mellitus with kidney complication, without long-term current use of insulin (HCC)   COPD GOLD O    Unspecified atrial fibrillation (HCC)   CKD (chronic kidney disease), stage III (HCC)   Chronic respiratory failure with hypoxia (HCC)   Pneumoperitoneum   Lactic acidosis   Perforation bowel (HCC)   Severe sepsis with septic shock (HCC)   Pleural effusion, left   Pneumatosis coli   Fecal peritonitis ([place])      1. Septic shock secondary to fecal peritonitis from bowel perforation.  With patient's multiple comorbidities including chronic respiratory failure, COPD, chronic kidney disease stage III, diabetes, risk for mortality around surgery is high.  After discussing with general surgery, patient decided that she would not want  to undergo surgery.  She understood that she will pass away from the stricture process.  Goal is to keep the patient comfortable.  DNR orders have been placed by general surgery.  I have informed the patient's daughter of her condition as well and daughter is in agreement with plan.  Will admit to the hospital for comfort care.  Anticipate in-hospital death.  DVT prophylaxis: None, comfort measures Code Status: DNR, comfort measures Family Communication: Discussed with daughter Elie Confer over the phone Disposition Plan: Anticipate in-hospital death Consults called: General surgery Admission status: Inpatient, MedSurg  Kathie Dike MD [facility]   If 7PM-7AM, please contact night-coverage [email or web address]   September 14, 2019, 10:15 AM

## 2019-09-11 NOTE — Sepsis Progress Note (Signed)
Sepsis bundle protocol followed. Pt was made [place] per family wishes therefore, pressor not started.

## 2019-09-11 NOTE — ED Notes (Signed)
Phone number for daughter [phone]

## 2019-09-11 NOTE — ED Provider Notes (Signed)
[facility] EMERGENCY DEPARTMENT Provider Note   CSN: [id number] Arrival date & time: 08/22/2019  2302     History Chief Complaint  Patient presents with  . Rectal Bleeding    [name] is a 85 y.o. female.  Patient with extensive medical history including coronary disease, COPD, congestive heart failure normal systolic ejection fraction, diabetes, atrial fibrillation on Coumadin, kidney disease, valve disease, recent admission and discharge for cholangitis on antibiotics presents with worsening abdominal pain and significant rectal bleeding the past 24 hours.  Patient says this is different than her previous history.  Patient feels fatigued.  No syncope.  Patient denies fevers.  Patient has had vomiting.        Past Medical History:  Diagnosis Date  . Abnormal CT of the chest    Mediastinal and hilar adenopathy followed by Dr. Koleen Nimrod in [place]  . Anemia, unspecified   . Body mass index (BMI) of 20.0-20.9 in adult FEB 2013 147 LBS  . CAD (coronary artery disease) 2011   Catheterization August 2011: mild nonobstructive coronary artery disease complicated by large left rectus sheath hematoma status post evacuation Coumadin restarted without recurrence  . Carotid artery disease ([place])    Doppler, 05/2012, 0-39% bilateral  . Cataract   . Chronic airway obstruction, not elsewhere classified   . Chronic kidney disease, stage II (mild)   . Congestive heart failure, unspecified    EF now 60%, previous nonischemic cardiomyopathy  . COVID-19   . Diabetes mellitus   . Dilated bile duct 10/19/2011  . Diverticulitis Dec 2012  . Gastritis    By EGD  . GERD (gastroesophageal reflux disease)   . Hypercholesterolemia   . Hypertension   . Mitral regurgitation 06/27/2013  . Neurogenic sleep apnea    Uses noctural oxygen prescribed by her pulmonologi  . Pacemaker    Single chamber [place]. Jude ACCENT [phone] SN 719 04/11/1957 10/31/2010  . Permanent atrial fibrillation (HCC)    H/o  tachybradycardia syndrome on Coumadin anticoagulation, has pacemaker.  . Recurrent UTI   . Rheumatic heart disease   . Tachycardia-bradycardia [facility])    s/p St. Jude single chamber PPM 10/2010   . Unspecified hypertensive kidney disease with chronic kidney disease stage I through stage IV, or unspecified(403.90)   . Valvular heart disease    a. H/o moderate mitral stenosis by cath 2011 but no mention of this on 10/2011 echo. b. 10/2011 echo: mod MR, mild AS, mild TR; EF>65%  . Vitamin D deficiency     Patient Active Problem List   Diagnosis Date Noted  . Urinary retention 08/30/2019  . Thrombocytopenia ([place]) 08/15/2019  . LFT elevation   . Cholangitis 08/11/2019  . Pneumothorax   . Pressure injury of skin 08/06/2019  . Pneumonia due to COVID-19 virus 05/10/2019  . Elevated troponin   . Pneumonia 05/09/2019  . Leg wound, left, sequela 05/09/2019  . Chronic respiratory failure with hypoxia ([place]) 05/09/2019  . COPD exacerbation ([place]) 04/26/2019  . Hypokalemia 04/26/2019  . Supratherapeutic INR 04/26/2019  . At high risk for falls 02/13/2016  . CKD (chronic kidney disease), stage III ([place]) 07/02/2015  . Hyperlipidemia with target LDL less than 100 12/31/2014  . Osteoporosis, post-menopausal 11/01/2013  . GERD (gastroesophageal reflux disease) 07/13/2013  . Aortic stenosis 06/27/2013  . COPD with acute exacerbation ([place]) 06/27/2013  . Acute on chronic diastolic HF (heart failure) ([place]) 06/27/2013  . Vitamin D deficiency   . Tobacco abuse 05/12/2012  . Abdominal wall hernia 10/19/2011  .  Common bile duct dilatation 10/19/2011  . Unspecified atrial fibrillation ([place])   . CAD (coronary artery disease)   . Pacemaker-St.Jude   . Chronic anticoagulation 12/03/2010  . Anxiety 12/03/2010  . Mitral stenosis 05/30/2010  . Macrocytic anemia 05/16/2010  . Type 2 diabetes mellitus with kidney complication, without long-term current use of insulin ([place]) 01/19/2009  . Essential hypertension  01/19/2009  . COPD GOLD O  01/19/2009  . HEART MURMUR, BENIGN 01/19/2009    Past Surgical History:  Procedure Laterality Date  . CHOLECYSTECTOMY     40+ years ago  . COLONOSCOPY  10/2011   Dyann Ruddle sigmoid to descending diverticulosis, internal hemorrhoids  . ESOPHAGOGASTRODUODENOSCOPY  10/2011   Fields-erosive gastritis, biopsy with no H. pylori, hiatal hernia, peptic duodenitis, no celiac disease, duodenal diverticulum, duodenal nodule  . EUS  11/16/11   Mishra-13 MM CBD, normal pancreatic duct, otherwise normal EUS  . EUS  April and Sept 2019    dilated CBD of 13 mm, normal pancreatic duct, no pancreatic mass. Unable to see repeat EUS results from Sept 2019 at [facility], but telephone notes report benign-appearing CBD dilatation.  . Evacuation of retroperitoneal and left rectus sheath    . HEMATOMA EVACUATION     femoral  . HERNIA REPAIR     X3  . PILONIDAL CYST / SINUS EXCISION    . Single-chamber pacemaker implantation  3/12   by Dr Caryl Comes     OB History    Gravida  2   Para  2   Term  2   Preterm      AB      Living        SAB      TAB      Ectopic      Multiple      Live Births              Family History  Problem Relation Age of Onset  . Cancer Mother        cervical  . Stroke Mother   . Cancer Daughter 1       kidney, lung  . Heart disease Father   . Heart attack Father   . Ulcers Father   . Early death Sister 1       died at 35 months old  . Hypertension Brother   . Cancer Paternal Aunt        breast  . Hypertension Son   . Colon cancer Neg Hx     Social History   Tobacco Use  . Smoking status: Former Smoker    Packs/day: 0.25    Years: 45.00    Pack years: 11.25    Types: Cigarettes    Start date: 02/05/1958    Quit date: 04/30/2019    Years since quitting: 0.3  . Smokeless tobacco: Never Used  Substance Use Topics  . Alcohol use: No    Alcohol/week: 0.0 standard drinks  . Drug use: No    Home Medications Prior  to Admission medications   Medication Sig Start Date End Date Taking? Authorizing Provider  budesonide-formoterol (SYMBICORT) 160-4.5 MCG/ACT inhaler Inhale 2 puffs into the lungs 2 (two) times daily. 01/13/19   Hassell Done, Mary-Margaret, FNP  clonazePAM (KLONOPIN) 0.5 MG tablet Take 1 tablet (0.5 mg total) by mouth 3 (three) times daily as needed for anxiety. 08/16/19   Johnson, Clanford L, MD  diltiazem (CARDIZEM) 120 MG tablet Take 1 tablet (120 mg total) by mouth daily. 05/09/19  Hassell Done, Mary-Margaret, FNP  ferrous sulfate 325 (65 FE) MG tablet Take 1 tablet (325 mg total) by mouth daily with breakfast. 10/29/15   Cherre Robins, PharmD  furosemide (LASIX) 20 MG tablet Take 1 tablet (20 mg total) by mouth every other day. 08/18/19   Johnson, Clanford L, MD  HYDROcodone-acetaminophen (NORCO) 7.5-325 MG tablet Take 1 tablet by mouth 3 (three) times daily as needed for severe pain (pain). 08/16/19   Johnson, Clanford L, MD  ipratropium-albuterol (DUONEB) 0.5-2.5 (3) MG/3ML SOLN INHALE THE CONTENTS OF 1 VIAL VIA NEBULIZER EVERY 4 HOURS AS NEEDED Patient taking differently: Take 3 mLs by nebulization every 4 (four) hours as needed (for shortness of breath/wheezing).  04/27/19   Kathie Dike, MD  linaclotide Rolan Lipa) 145 MCG CAPS capsule Take 1 capsule (145 mcg total) by mouth daily before breakfast. 08/07/19   Barton Dubois, MD  lipase/protease/amylase (CREON) 36000 UNITS CPEP capsule Take 1 capsule (36,000 Units total) by mouth See admin instructions. Take 3 capsules with meals and take 1 capsule with a snack as directed 08/16/19   Murlean Iba, MD  Multiple Vitamins-Minerals (PRESERVISION AREDS 2) CAPS Take 1 capsule by mouth daily.     [provider]  nitroGLYCERIN (NITROSTAT) 0.4 MG SL tablet DISSOLVE 1 TABLET UNDER THE TONGUE EVERY 5 MINUTES AS NEEDED FOR CHEST PAIN AS DIRECTED Patient taking differently: Place 0.4 mg under the tongue every 5 (five) minutes as needed for chest pain.  04/28/19    Herminio Commons, MD  OXYGEN Inhale 2 L into the lungs at bedtime. 2lpm with sleep only     [provider]  pantoprazole (PROTONIX) 40 MG tablet Take 1 tablet (40 mg total) by mouth 2 (two) times daily. 06/23/19   Hassell Done, Mary-Margaret, FNP  potassium chloride SA (KLOR-CON) 20 MEQ tablet Take 1 tablet (20 mEq total) by mouth every other day. 08/18/19   Johnson, Clanford L, MD  simvastatin (ZOCOR) 10 MG tablet Take 1 tablet (10 mg total) by mouth daily. 05/09/19   Hassell Done, Mary-Margaret, FNP  sitaGLIPtin (JANUVIA) 50 MG tablet TAKE 1 TABLET (50 MG TOTAL) BY MOUTH DAILY. Patient taking differently: Take 50 mg by mouth daily.  05/09/19   Hassell Done, Mary-Margaret, FNP  VENTOLIN HFA 108 (90 Base) MCG/ACT inhaler USE 2 PUFFS EVERY 6 HOURS AS NEEDED Patient taking differently: Inhale 2 puffs into the lungs every 6 (six) hours as needed for wheezing or shortness of breath.  01/18/17   [name], FNP  warfarin (COUMADIN) 2 MG tablet Take 0.5 tablets (1 mg total) by mouth every evening. TAKE 1/2  TABLET (1 MG TOTAL) DAILY AT 6 PM.Get INR check by PCP the day after you are discharged 05/18/19   Thurnell Lose, MD    Allergies    Chlorhexidine, Macrobid [nitrofurantoin monohyd macro], Torsemide, and Tramadol  Review of Systems   Review of Systems  Constitutional: Positive for fatigue. Negative for chills and fever.  HENT: Negative for congestion.   Eyes: Negative for visual disturbance.  Respiratory: Negative for shortness of breath.   Cardiovascular: Negative for chest pain.  Gastrointestinal: Positive for abdominal pain, blood in stool, nausea and vomiting.  Genitourinary: Negative for dysuria and flank pain.  Musculoskeletal: Negative for back pain, neck pain and neck stiffness.  Skin: Negative for rash.  Neurological: Positive for light-headedness. Negative for headaches.    Physical Exam Updated Vital Signs BP 114/62 (BP Location: Right Arm)   Pulse (!) 109   Temp 97.7 F  (36.5 C) (  Oral)   Resp 20   Wt 57.6 kg   SpO2 100%   BMI 23.23 kg/m   Physical Exam Vitals and nursing note reviewed.  Constitutional:      Appearance: She is well-developed.  HENT:     Head: Normocephalic and atraumatic.     Comments: Dry mucous membranes Eyes:     General:        Right eye: No discharge.        Left eye: No discharge.  Neck:     Trachea: No tracheal deviation.  Cardiovascular:     Rate and Rhythm: Regular rhythm. Tachycardia present.  Pulmonary:     Effort: Pulmonary effort is normal.     Breath sounds: Normal breath sounds.  Abdominal:     General: There is distension.     Palpations: Abdomen is soft.     Tenderness: There is abdominal tenderness. There is guarding.  Musculoskeletal:        General: No swelling.     Cervical back: Normal range of motion.  Skin:    General: Skin is warm.     Capillary Refill: Capillary refill takes less than 2 seconds.     Findings: No rash.  Neurological:     General: No focal deficit present.     Mental Status: She is alert and oriented to person, place, and time.     Cranial Nerves: No cranial nerve deficit.  Psychiatric:        Mood and Affect: Mood is not anxious.     Comments: Fatigue appearance     ED Results / Procedures / Treatments   Labs (all labs ordered are listed, but only abnormal results are displayed) Labs Reviewed  COMPREHENSIVE METABOLIC PANEL - Abnormal; Notable for the following components:      Result Value   Glucose, Bld 228 (*)    BUN 33 (*)    Albumin 2.8 (*)    AST 42 (*)    GFR calc non Af Amer 56 (*)    All other components within normal limits  CBC WITH DIFFERENTIAL/PLATELET - Abnormal; Notable for the following components:   WBC 15.7 (*)    RBC 3.37 (*)    Hemoglobin 10.8 (*)    HCT 35.7 (*)    MCV 105.9 (*)    Platelets 426 (*)    Neutro Abs 13.2 (*)    Monocytes Absolute 1.1 (*)    Abs Immature Granulocytes 0.09 (*)    All other components within normal limits    PROTIME-INR - Abnormal; Notable for the following components:   Prothrombin Time 20.8 (*)    INR 1.8 (*)    All other components within normal limits  LACTIC ACID, PLASMA - Abnormal; Notable for the following components:   Lactic Acid, Venous 3.6 (*)    All other components within normal limits  URINALYSIS, ROUTINE W REFLEX MICROSCOPIC - Abnormal; Notable for the following components:   APPearance TURBID (*)    Hgb urine dipstick LARGE (*)    Ketones, ur TRACE (*)    Protein, ur 100 (*)    Leukocytes,Ua LARGE (*)    All other components within normal limits  URINALYSIS, MICROSCOPIC (REFLEX) - Abnormal; Notable for the following components:   Bacteria, UA MANY (*)    All other components within normal limits  POC OCCULT BLOOD, ED - Abnormal; Notable for the following components:   Fecal Occult Bld POSITIVE (*)    All other components within normal  limits  RESPIRATORY PANEL BY RT PCR (FLU A&B, COVID)  URINE CULTURE  CULTURE, BLOOD (ROUTINE X 2)  CULTURE, BLOOD (ROUTINE X 2)  LIPASE, BLOOD  LACTIC ACID, PLASMA  LACTIC ACID, PLASMA  CBC  TYPE AND SCREEN    EKG None  Radiology CT ABDOMEN PELVIS WO CONTRAST  Result Date: 10/05/19 CLINICAL DATA:  Abdominal pain and rectal bleeding, no IV access go to left bad news bears in a if EXAM: CT ABDOMEN AND PELVIS WITHOUT CONTRAST TECHNIQUE: Multidetector CT imaging of the abdomen and pelvis was performed following the standard protocol without IV contrast. COMPARISON:  CT abdomen pelvis 08/10/2019 FINDINGS: Lower chest: Persistent moderate left pleural effusion with subtotal atelectatic collapse of the left lower lobe. Few punctate calcifications in the lung parenchyma likely reflect sequela of remote granulomatous disease. Leftward mediastinal shift is likely related to the left basilar volume loss and patient positioning. Heart is borderline enlarged. There is extensive coronary artery calcification. Aortic leaflet calcifications are  present as well. Cardiac pacer leads are noted at the apex. No pericardial effusion. Hepatobiliary: No focal liver lesions are seen. Severe distention of the extrahepatic common bile duct is similar to comparison ultrasound 08/11/2019 measuring up to 2.1 cm in maximal diameter (5/34). Some moderate intrahepatic ductal dilatation is noted as well. Gallbladder is surgically absent. Pancreas: Pancreatic atrophy. No peripancreatic inflammation or ductal dilatation. Spleen: Normal in size without focal abnormality. Adrenals/Urinary Tract: Bilateral renal atrophy, left slightly more pronounced on the right. Mild right hydronephrosis without obstructing calculus though dilatation of the right ureter may be secondary to mass effect compression by the enlarged stool-filled colon. Circumferential thickening of the urinary bladder is noted. Stomach/Bowel: The distal esophagus is patulous and fluid-filled with a moderate hiatal hernia. High attenuation material layering within the stomach is nonspecific, correlate for ingested material. Much of the small bowel appears grossly decompressed. There is diffuse edematous mural thickening throughout the colon with marked tortuosity of the sigmoid and extensive colonic diverticulosis. There is an extreme volume of fecal material extending throughout much of the sigmoid distending the bowel up to 10.7 cm in diameter with mural thickening and extensive pneumatosis which is new since interval old CT performed 08/10/2019. Extensive pericolonic inflammation and free fluid is seen as well worrisome for perforation though the site of which is difficult to ascertain on imaging. Vascular/Lymphatic: Extensive atherosclerosis of the aorta and branch vessels. Extensive reactive adenopathy is seen in the mid mesentery. No pathologically enlarged nodes are clearly evident though evaluation is limited in the absence of contrast media. Reproductive: Atrophic appearance of the uterus is  age-appropriate. No concerning adnexal lesions. Other: Extensive free fluid noted throughout the pelvis. Free air is seen tracking anterior to the liver. A intermediate collection of fluid is seen anterolateral to the bladder measuring up to 32 Hounsfield units. Succus or hemorrhage could have this appearance. There is extreme circumferential body wall edema. More focal thickening is noted around the level of the sacrum. Should be correlated for features of decubitus ulceration. Musculoskeletal: Curvature of the spine is similar to priors. The osseous structures appear diffusely demineralized which may limit detection of small or nondisplaced fractures. No acute osseous abnormality or suspicious osseous lesion. Multilevel degenerative changes are present in the imaged portions of the spine. Additional degenerative changes both hips and at the SI joints. IMPRESSION: 1. Features of severe stercoral colitis with distension of the colon to 10.7 cm and extensive pneumatosis and bowel thickening throughout the entirety of the sigmoid colon. Surrounding  pericolonic inflammation is present as well as free air and free intermediate attenuation fluid in the pelvis worrisome for possible succus or hemoperitoneum. Urgent surgical consultation is warranted. 2. Mild right ureteral dilatation possibly secondary to compression by the stool filled colon. 3. Circumferential thickening of the urinary bladder, potentially reactive though should correlate with urinalysis to exclude cystitis. 4. Persistent moderate left pleural effusion with subtotal atelectatic collapse of the left lower lobe. 5. Moderate intrahepatic and severe extrahepatic biliary ductal dilatation. Greater than expected for age and post cholecystectomy state. Finding is similar to comparison ultrasound 08/11/2019. No visible is intraductal gallstones though may consider MRCP in the nonemergent setting if further evaluation is warranted. Critical Value/emergent  results were called by telephone at the time of interpretation on 2019/10/04 at 4:27 am to provider [facility] , who verbally acknowledged these results. Electronically Signed   By: [name] M.D.   On: 10/04/2019 04:27   DG Chest Portable 1 View  Result Date: 10/04/19 CLINICAL DATA:  COPD EXAM: PORTABLE CHEST 1 VIEW COMPARISON:  08/15/2019 FINDINGS: Left pacer remains in place, unchanged. Cardiomegaly, aortic atherosclerosis. Improving opacity in the left lung. Continued left lower lobe consolidation and probable left effusion. No confluent opacity on the right. IMPRESSION: Left lower lobe airspace disease with probable left effusion. This has improved since prior study. Cardiomegaly, aortic atherosclerosis. Electronically Signed   By: Rolm Baptise M.D.   On: October 04, 2019 02:12    Procedures .Critical Care Performed by: Elnora Morrison, MD Authorized by: Elnora Morrison, MD   Critical care provider statement:    Critical care time (minutes):  105   Critical care start time:  10/04/2019 3:00 AM   Critical care end time:  October 04, 2019 4:45 AM   Critical care time was exclusive of:  Separately billable procedures and treating other patients and teaching time   Critical care was necessary to treat or prevent imminent or life-threatening deterioration of the following conditions:  Sepsis   Critical care was time spent personally by me on the following activities:  Discussions with consultants, evaluation of patient's response to treatment, examination of patient, ordering and performing treatments and interventions, ordering and review of laboratory studies, ordering and review of radiographic studies, pulse oximetry, re-evaluation of patient's condition and review of old charts Ultrasound ED Peripheral IV (Provider)  Date/Time: 10-04-2019 4:58 AM Performed by: Elnora Morrison, MD Authorized by: Elnora Morrison, MD   Procedure details:    Indications: multiple failed IV attempts     Skin Prep:  chlorhexidine gluconate     Location:  Right AC   Angiocath:  18 G   Bedside Ultrasound Guided: Yes     Images: archived     Patient tolerated procedure without complications: Yes     Dressing applied: Yes   Ultrasound ED Peripheral IV (Provider)  Date/Time: 10-04-2019 4:59 AM Performed by: Elnora Morrison, MD Authorized by: Elnora Morrison, MD   Procedure details:    Indications: multiple failed IV attempts     Skin Prep: chlorhexidine gluconate     Location:  Right AC   Angiocath:  20 G   Bedside Ultrasound Guided: Yes     Images: archived     Patient tolerated procedure without complications: Yes     Dressing applied: Yes     (including critical care time)  Medications Ordered in ED Medications  fentaNYL (SUBLIMAZE) injection 25 mcg (25 mcg Intravenous Given 2019-10-04 0520)  piperacillin-tazobactam (ZOSYN) IVPB 3.375 g (3.375 g Intravenous New Bag/Given 10-04-2019 0521)  fentaNYL (SUBLIMAZE) injection 25 mcg (25 mcg Intravenous Given 2019-09-26 0116)  sodium chloride 0.9 % bolus 500 mL (0 mLs Intravenous Stopped 09/26/19 0521)  iohexol (OMNIPAQUE) 350 MG/ML injection 100 mL (100 mLs Intravenous Contrast Given 09-26-19 0131)  sodium chloride 0.9 % bolus 1,000 mL (1,000 mLs Intravenous New Bag/Given 09-26-2019 0521)  pantoprazole (PROTONIX) injection 40 mg (40 mg Intravenous Given 09-26-2019 0520)    ED Course  I have reviewed the triage vital signs and the nursing notes.  Pertinent labs & imaging results that were available during my care of the patient were reviewed by me and considered in my medical decision making (see chart for details).    MDM Rules/Calculators/A&P                     Patient on anticoagulation for atrial fibrillation and complex medical history and recent admission presents with GI bleeding and abdominal pain.  Differential broad considering recent cholangitis and medical history.  No fever on arrival.  Gross blood per rectum.  Initial plan for CT angiogram to get  further details especially with atrial fibrillation history and elevated lactic acid.  2 ultrasound-guided IVs placed in both stopped working on attempt of contrast.  CT scan changed to without contrast.  Pain meds, antibiotics ordered.  Discussed with radiology and significant findings on CT scan including free air, free fluid, diffuse inflammation of the colon, dilated colon.  Patient is DNR.  Zosyn ordered for antibiotics and fluids.  Sepsis screening initiated.  Urinalysis concerning for infection as well urine culture.  Discussed with local general surgeon Dr. Arnoldo Morale who will come and evaluate and talk to patient regarding goals of care and next decision whether it be surgery/medical management/palliative care.  Sepsis - Repeat Assessment  Performed at:    4 am  Vitals     Blood pressure 114/62, pulse (!) 109, temperature 97.7 F (36.5 C), temperature source Oral, resp. rate 20, weight 57.6 kg, SpO2 100 %.  Heart:     Irregular rate and rhythm  Lungs:    CTA  Capillary Refill:   <2 sec  Peripheral Pulse:   Radial pulse palpable  Skin:     Normal Color  Blood work reviewed lactic acid greater than 3, white blood cell count 15.7, hemoglobin 10.8.  INR 1.8.  Surgery asked to hold reversal until decision made whether to go emergently to the operating room.  Reassessed patient again, updated her on the unfortunate CT scan results.  Discussed that the surgeon will be in shortly to help guide her with decision on whether to go the operating room.  Dr. Arnoldo Morale also spoke with the patient.  Patient would not survive surgery, lactate significant elevated 11 and unfortunately her pathology is extremely serious.  Plan for DNR, comfort care.  Hospitalist paged.  Morphine ordered.  Fluids will be continued however no further antibiotics indicated.  Final Clinical Impression(s) / ED Diagnoses Final diagnoses:  Acute GI bleeding  Chronic atrial fibrillation ([place])  Pneumoperitoneum  Colitis, acute     Rx / DC Orders ED Discharge Orders    None       Elnora Morrison, MD 09-26-19 [phone]

## 2019-09-11 NOTE — Death Summary Note (Addendum)
DEATH SUMMARY   Patient Details  Name: [name] MRN: [id number] DOB: [birth year]  Admission/Discharge Information   Admit Date:  September 25, 2019  Date of Death: Date of Death: 09-26-2019  Time of Death: Time of Death: 11/18/36  Length of Stay: 0  Referring Physician: [name], FNP   Reason(s) for Hospitalization  Abdominal pain with rectal bleeding  Diagnoses  Preliminary cause of death: septic shock secondary to fecal peritonitis Secondary Diagnoses (including complications and co-morbidities):  Active Problems:   Type 2 diabetes mellitus with kidney complication, without long-term current use of insulin (HCC)   COPD GOLD O    Unspecified atrial fibrillation (HCC)   Chronic respiratory failure with hypoxia (HCC)   Pneumoperitoneum   Lactic acidosis   Perforation bowel (HCC)   Severe sepsis with septic shock (HCC)   Pleural effusion, left   Pneumatosis coli   Fecal peritonitis [facility])   Brief Hospital Course (including significant findings, care, treatment, and services provided and events leading to death)  [name] is a 85 y.o. year old female who chronic atrial fibrillation, COPD, chronic respiratory failure with hypoxia on chronic oxygen, presented to the emergency room with worsening abdominal pain and rectal bleeding.  History was limited secondary to patient mental status and she was unresponsive.  Family cannot provide any additional history.  History was obtained from the medical record.  She reported onset of symptoms within the past 24 hours.  She had abdominal pain, rectal bleeding and vomiting.  CT scan of the abdomen pelvis performed in the emergency room showed severe stercoral colitis with extensive distention of the sigmoid colon, pneumatosis, free air and free fluid consistent with possible succus or hemoperitoneum.  She was evaluated in the emergency room by general surgery.  She was hypotensive.  Lactic acid was noted to be greater than 11.  WBC  count increased to 27,400.  Due to her multiple comorbidities and frailty, she was felt to be very high risk for surgery and had very high mortality risk.  After surgery discussed with patient, patient stated that she did not want to undergo surgery and did not want prolonged intubation or heroic measures.  Patient was made DNR in the emergency room.  She was treated supportively for pain management.  Her condition quickly declined and patient expired while still in the emergency room.  Family was present.    Pertinent Labs and Studies  Significant Diagnostic Studies CT ABDOMEN PELVIS WO CONTRAST  Result Date: 09-26-2019 CLINICAL DATA:  Abdominal pain and rectal bleeding, no IV access go to left bad news bears in a if EXAM: CT ABDOMEN AND PELVIS WITHOUT CONTRAST TECHNIQUE: Multidetector CT imaging of the abdomen and pelvis was performed following the standard protocol without IV contrast. COMPARISON:  CT abdomen pelvis 08/10/2019 FINDINGS: Lower chest: Persistent moderate left pleural effusion with subtotal atelectatic collapse of the left lower lobe. Few punctate calcifications in the lung parenchyma likely reflect sequela of remote granulomatous disease. Leftward mediastinal shift is likely related to the left basilar volume loss and patient positioning. Heart is borderline enlarged. There is extensive coronary artery calcification. Aortic leaflet calcifications are present as well. Cardiac pacer leads are noted at the apex. No pericardial effusion. Hepatobiliary: No focal liver lesions are seen. Severe distention of the extrahepatic common bile duct is similar to comparison ultrasound 08/11/2019 measuring up to 2.1 cm in maximal diameter (5/34). Some moderate intrahepatic ductal dilatation is noted as well. Gallbladder is surgically absent. Pancreas: Pancreatic atrophy. No peripancreatic inflammation or  ductal dilatation. Spleen: Normal in size without focal abnormality. Adrenals/Urinary Tract: Bilateral  renal atrophy, left slightly more pronounced on the right. Mild right hydronephrosis without obstructing calculus though dilatation of the right ureter may be secondary to mass effect compression by the enlarged stool-filled colon. Circumferential thickening of the urinary bladder is noted. Stomach/Bowel: The distal esophagus is patulous and fluid-filled with a moderate hiatal hernia. High attenuation material layering within the stomach is nonspecific, correlate for ingested material. Much of the small bowel appears grossly decompressed. There is diffuse edematous mural thickening throughout the colon with marked tortuosity of the sigmoid and extensive colonic diverticulosis. There is an extreme volume of fecal material extending throughout much of the sigmoid distending the bowel up to 10.7 cm in diameter with mural thickening and extensive pneumatosis which is new since interval old CT performed 08/10/2019. Extensive pericolonic inflammation and free fluid is seen as well worrisome for perforation though the site of which is difficult to ascertain on imaging. Vascular/Lymphatic: Extensive atherosclerosis of the aorta and branch vessels. Extensive reactive adenopathy is seen in the mid mesentery. No pathologically enlarged nodes are clearly evident though evaluation is limited in the absence of contrast media. Reproductive: Atrophic appearance of the uterus is age-appropriate. No concerning adnexal lesions. Other: Extensive free fluid noted throughout the pelvis. Free air is seen tracking anterior to the liver. A intermediate collection of fluid is seen anterolateral to the bladder measuring up to 32 Hounsfield units. Succus or hemorrhage could have this appearance. There is extreme circumferential body wall edema. More focal thickening is noted around the level of the sacrum. Should be correlated for features of decubitus ulceration. Musculoskeletal: Curvature of the spine is similar to priors. The osseous  structures appear diffusely demineralized which may limit detection of small or nondisplaced fractures. No acute osseous abnormality or suspicious osseous lesion. Multilevel degenerative changes are present in the imaged portions of the spine. Additional degenerative changes both hips and at the SI joints. IMPRESSION: 1. Features of severe stercoral colitis with distension of the colon to 10.7 cm and extensive pneumatosis and bowel thickening throughout the entirety of the sigmoid colon. Surrounding pericolonic inflammation is present as well as free air and free intermediate attenuation fluid in the pelvis worrisome for possible succus or hemoperitoneum. Urgent surgical consultation is warranted. 2. Mild right ureteral dilatation possibly secondary to compression by the stool filled colon. 3. Circumferential thickening of the urinary bladder, potentially reactive though should correlate with urinalysis to exclude cystitis. 4. Persistent moderate left pleural effusion with subtotal atelectatic collapse of the left lower lobe. 5. Moderate intrahepatic and severe extrahepatic biliary ductal dilatation. Greater than expected for age and post cholecystectomy state. Finding is similar to comparison ultrasound 08/11/2019. No visible is intraductal gallstones though may consider MRCP in the nonemergent setting if further evaluation is warranted. Critical Value/emergent results were called by telephone at the time of interpretation on 09-30-2019 at 4:27 am to provider [facility] , who verbally acknowledged these results. Electronically Signed   By: [name] M.D.   On: 09-30-19 04:27   DG Chest Portable 1 View  Result Date: 09-30-19 CLINICAL DATA:  COPD EXAM: PORTABLE CHEST 1 VIEW COMPARISON:  08/15/2019 FINDINGS: Left pacer remains in place, unchanged. Cardiomegaly, aortic atherosclerosis. Improving opacity in the left lung. Continued left lower lobe consolidation and probable left effusion. No confluent  opacity on the right. IMPRESSION: Left lower lobe airspace disease with probable left effusion. This has improved since prior study. Cardiomegaly, aortic atherosclerosis. Electronically  Signed   By: Rolm Baptise M.D.   On: 09/18/19 02:12   DG CHEST PORT 1 VIEW  Result Date: 08/15/2019 CLINICAL DATA:  Shortness of breath.  History of COVID. EXAM: PORTABLE CHEST 1 VIEW COMPARISON:  08/11/2019. FINDINGS: Cardiac pacer noted in stable position. Cardiomegaly. Bilateral interstitial prominence. New onset of large left pleural effusion. Underlying left lung consolidation cannot be excluded. Previously identified left pneumothorax no longer visualized. IMPRESSION: 1. Bilateral interstitial prominence noted. Interstitial edema and/or pneumonitis could present this fashion. New onset of large left pleural effusion. Underlying left lung consolidation cannot be excluded. 2.  Interval resolution of left pneumothorax. 3. Cardiac pacer stable position. Cardiomegaly. A component of CHF cannot be excluded. Electronically Signed   By: Marcello Moores  Register   On: 08/15/2019 11:58   XR Chest Single View  Result Date: 08/11/2019 CLINICAL DATA:  Confusion. COVID-19 test pending. Diabetes. Hypertension. CHF. EXAM: PORTABLE CHEST 1 VIEW COMPARISON:  08/05/2019 FINDINGS: Right humeral head probable enchondroma. High riding bilateral humeral L it has, consistent with chronic rotator cuff insufficiency. Midline trachea. Moderate cardiomegaly. Single lead pacer. Atherosclerosis in the transverse aorta. Right costophrenic angle minimally excluded. No right-sided pleural effusion. No large left pleural effusion. Limited evaluation for left pleural fluid secondary to AP portable technique and cardiomegaly. 5% left apical pneumothorax, slightly increased. Diffuse interstitial thickening. Skin fold over the right hemithorax. Persistent left base opacity. IMPRESSION: Slight increase in 5% left apical pneumothorax. Improved left base aeration.   Favor residual atelectasis. Cardiomegaly with increased interstitial prominence. Favored to be technique related. No overt congestive failure. Electronically Signed   By: Abigail Miyamoto M.D.   On: 08/11/2019 15:55   US Abdomen Limited RUQ  Result Date: 08/11/2019 CLINICAL DATA:  Elevated liver function tests. EXAM: ULTRASOUND ABDOMEN LIMITED RIGHT UPPER QUADRANT COMPARISON:  CT 05/13/2019 5 CT 12/11/2018 FINDINGS: Gallbladder: Cholecystectomy Common bile duct: Diameter: Dilated intrahepatic ducts. The common duct is dilated, including at 2.3 cm. Compare 1.5 cm on 05/13/2019 CT. Liver: No focal lesion identified. Within normal limits in parenchymal echogenicity. Portal vein is patent on color Doppler imaging with normal direction of blood flow towards the liver. Other: None. IMPRESSION: Cholecystectomy. Intra and extrahepatic biliary duct dilatation. Although duct dilatation has been present back to a CT of 12/27/2017, it is progressive compared to the most recent CT. In the setting of elevated liver function tests, consider MRCP or ERCP to exclude choledocholithiasis. Electronically Signed   By: Abigail Miyamoto M.D.   On: 08/11/2019 17:14    Microbiology Recent Results (from the past 240 hour(s))  Respiratory Panel by RT PCR (Flu A&B, Covid) - Nasopharyngeal Swab     Status: None   Collection Time: 2019/09/18  2:14 AM   Specimen: Nasopharyngeal Swab  Result Value Ref Range Status   SARS Coronavirus 2 by RT PCR NEGATIVE NEGATIVE Final    Comment: (NOTE) SARS-CoV-2 target nucleic acids are NOT DETECTED. The SARS-CoV-2 RNA is generally detectable in upper respiratoy specimens during the acute phase of infection. The lowest concentration of SARS-CoV-2 viral copies this assay can detect is 131 copies/mL. A negative result does not preclude SARS-Cov-2 infection and should not be used as the sole basis for treatment or other patient management decisions. A negative result may occur with  improper specimen  collection/handling, submission of specimen other than nasopharyngeal swab, presence of viral mutation(s) within the areas targeted by this assay, and inadequate number of viral copies (<131 copies/mL). A negative result must be combined with clinical observations,  patient history, and epidemiological information. The expected result is Negative. Fact Sheet for Patients:  PinkCheek.be Fact Sheet for Healthcare Providers:  GravelBags.it This test is not yet ap proved or cleared by the [place] FDA and  has been authorized for detection and/or diagnosis of SARS-CoV-2 by FDA under an Emergency Use Authorization (EUA). This EUA will remain  in effect (meaning this test can be used) for the duration of the COVID-19 declaration under Section 564(b)(1) of the Act, 21 U.S.C. section 360bbb-3(b)(1), unless the authorization is terminated or revoked sooner.    Influenza A by PCR NEGATIVE NEGATIVE Final   Influenza B by PCR NEGATIVE NEGATIVE Final    Comment: (NOTE) The Xpert Xpress SARS-CoV-2/FLU/RSV assay is intended as an aid in  the diagnosis of influenza from Nasopharyngeal swab specimens and  should not be used as a sole basis for treatment. Nasal washings and  aspirates are unacceptable for Xpert Xpress SARS-CoV-2/FLU/RSV  testing. Fact Sheet for Patients: PinkCheek.be Fact Sheet for Healthcare Providers: GravelBags.it This test is not yet approved or cleared by the [place] FDA and  has been authorized for detection and/or diagnosis of SARS-CoV-2 by  FDA under an Emergency Use Authorization (EUA). This EUA will remain  in effect (meaning this test can be used) for the duration of the  Covid-19 declaration under Section 564(b)(1) of the Act, 21  U.S.C. section 360bbb-3(b)(1), unless the authorization is  terminated or revoked. Performed at [facility],  [address], [place], [place] [address]   Blood culture (routine x 2)     Status: None (Preliminary result)   Collection Time: 09/30/19  4:46 AM   Specimen: BLOOD  Result Value Ref Range Status   Specimen Description BLOOD LEFT ANTECUBITAL  Final   Special Requests   Final    BOTTLES DRAWN AEROBIC AND ANAEROBIC Blood Culture adequate volume   Culture   Final    NO GROWTH < 12 HOURS Performed at [facility], [address], [place], [place] [address]    Report Status PENDING  Incomplete  Blood culture (routine x 2)     Status: None (Preliminary result)   Collection Time: 09-30-19  4:55 AM   Specimen: BLOOD RIGHT HAND  Result Value Ref Range Status   Specimen Description BLOOD RIGHT HAND  Final   Special Requests AEROBIC BOTTLE ONLY Blood Culture adequate volume  Final   Culture   Final    NO GROWTH < 12 HOURS Performed at [facility], [address] [place], [place] [address]    Report Status PENDING  Incomplete    Lab Basic Metabolic Panel: Recent Labs  Lab 08/18/2019 2357  NA 139  K 4.5  CL 106  CO2 24  GLUCOSE 228*  BUN 33*  CREATININE 0.94  CALCIUM 9.4   Liver Function Tests: Recent Labs  Lab 08/16/2019 2357  AST 42*  ALT 37  ALKPHOS 126  BILITOT 0.9  PROT 6.8  ALBUMIN 2.8*   Recent Labs  Lab 09/04/2019 2357  LIPASE 20   No results for input(s): AMMONIA in the last 168 hours. CBC: Recent Labs  Lab 09/10/2019 2357 09-30-19 0618  WBC 15.7* 27.4*  NEUTROABS 13.2*  --   HGB 10.8* 10.0*  HCT 35.7* 35.3*  MCV 105.9* 115.0*  PLT 426* 354   Cardiac Enzymes: No results for input(s): CKTOTAL, CKMB, CKMBINDEX, TROPONINI in the last 168 hours. Sepsis Labs: Recent Labs  Lab 08/26/2019 2357 09/30/19 0455 30-Sep-2019 0618  WBC 15.7*  --  27.4*  LATICACIDVEN 3.6* >11.0* >11.0*    Procedures/Operations     [employer] 09/23/19, 12:35 PM

## 2019-09-11 NOTE — ED Notes (Signed)
Fritz Pickerel from [facility] called and stated pt's IV had infiltrated; Dr. Reather Converse informed and he inserted new IV; Fritz Pickerel informed

## 2019-09-11 NOTE — ED Notes (Addendum)
Pt has expired, auscultated apical pulse x 60 seconds with no heartbeat heard. Dr Roderic Palau notified. Family at bedside.

## 2019-09-11 NOTE — ED Notes (Signed)
Fritz Pickerel from [place] [facility] called and stated pt's IV leaking, went over to check on pt and IV infiltrated, IV removed

## 2019-09-11 DEATH — deceased

## 2019-09-12 LAB — CULTURE, BLOOD (ROUTINE X 2)
Culture: NO GROWTH
Culture: NO GROWTH
Special Requests: ADEQUATE
Special Requests: ADEQUATE

## 2019-10-11 ENCOUNTER — Ambulatory Visit: Payer: Medicare HMO | Admitting: Urology

## 2020-06-14 ENCOUNTER — Encounter: Payer: Medicare HMO | Admitting: Internal Medicine

## 2020-07-02 ENCOUNTER — Encounter: Payer: Self-pay | Admitting: *Deleted

## 2020-07-02 NOTE — Progress Notes (Unsigned)
Patient passed away on 10/05/19. [facility] had one PROLIA Sol 60mg /ml refrigerated for pt - date of fill was 03/20/19. It was mailed to [place] from [place]. PE#[id number] (no refills)  Exp date on med was 10/22.  [facility] was called, in the possibility that they may want this shipped back to them. They stated that they can not receive this back due to Temp control and that we should discard it.   This was discarded today 07/02/20, 0845- witnessed by Trinna Post, LPN and  W, Burnetta Sabin
